# Patient Record
Sex: Female | Born: 1955
Health system: Southern US, Community
[De-identification: ages and names within clinical notes are randomized; demographics above are authoritative.]

## PROBLEM LIST (undated history)

## (undated) DIAGNOSIS — M2550 Pain in unspecified joint: Secondary | ICD-10-CM

## (undated) DIAGNOSIS — R0600 Dyspnea, unspecified: Secondary | ICD-10-CM

## (undated) DIAGNOSIS — M810 Age-related osteoporosis without current pathological fracture: Secondary | ICD-10-CM

## (undated) DIAGNOSIS — R0609 Other forms of dyspnea: Secondary | ICD-10-CM

## (undated) DIAGNOSIS — R0602 Shortness of breath: Secondary | ICD-10-CM

## (undated) DIAGNOSIS — M7989 Other specified soft tissue disorders: Secondary | ICD-10-CM

## (undated) DIAGNOSIS — M1990 Unspecified osteoarthritis, unspecified site: Secondary | ICD-10-CM

## (undated) DIAGNOSIS — N289 Disorder of kidney and ureter, unspecified: Secondary | ICD-10-CM

## (undated) DIAGNOSIS — R7303 Prediabetes: Secondary | ICD-10-CM

## (undated) DIAGNOSIS — K5900 Constipation, unspecified: Secondary | ICD-10-CM

## (undated) DIAGNOSIS — E039 Hypothyroidism, unspecified: Secondary | ICD-10-CM

## (undated) DIAGNOSIS — L817 Pigmented purpuric dermatosis: Secondary | ICD-10-CM

## (undated) DIAGNOSIS — E785 Hyperlipidemia, unspecified: Secondary | ICD-10-CM

## (undated) DIAGNOSIS — R12 Heartburn: Secondary | ICD-10-CM

## (undated) DIAGNOSIS — E559 Vitamin D deficiency, unspecified: Secondary | ICD-10-CM

## (undated) DIAGNOSIS — R011 Cardiac murmur, unspecified: Secondary | ICD-10-CM

## (undated) DIAGNOSIS — I10 Essential (primary) hypertension: Secondary | ICD-10-CM

## (undated) HISTORY — DX: Constipation, unspecified: K59.00

## (undated) HISTORY — DX: Unspecified osteoarthritis, unspecified site: M19.90

## (undated) HISTORY — DX: Other specified soft tissue disorders: M79.89

## (undated) HISTORY — DX: Heartburn: R12

## (undated) HISTORY — DX: Age-related osteoporosis without current pathological fracture: M81.0

## (undated) HISTORY — DX: Vitamin D deficiency, unspecified: E55.9

## (undated) HISTORY — PX: TONSILLECTOMY: SUR1361

## (undated) HISTORY — DX: Pain in unspecified joint: M25.50

## (undated) HISTORY — DX: Prediabetes: R73.03

## (undated) HISTORY — DX: Dyspnea, unspecified: R06.00

## (undated) HISTORY — DX: Shortness of breath: R06.02

## (undated) HISTORY — DX: Disorder of kidney and ureter, unspecified: N28.9

---

## 1998-12-22 ENCOUNTER — Other Ambulatory Visit: Admission: RE | Admit: 1998-12-22 | Discharge: 1998-12-22 | Payer: Self-pay | Admitting: Family Medicine

## 1999-08-31 ENCOUNTER — Encounter: Admission: RE | Admit: 1999-08-31 | Discharge: 1999-08-31 | Payer: Self-pay | Admitting: Family Medicine

## 1999-08-31 ENCOUNTER — Encounter: Payer: Self-pay | Admitting: Family Medicine

## 1999-12-26 ENCOUNTER — Other Ambulatory Visit: Admission: RE | Admit: 1999-12-26 | Discharge: 1999-12-26 | Payer: Self-pay | Admitting: Family Medicine

## 1999-12-27 ENCOUNTER — Encounter: Payer: Self-pay | Admitting: Urology

## 1999-12-27 ENCOUNTER — Encounter: Admission: RE | Admit: 1999-12-27 | Discharge: 1999-12-27 | Payer: Self-pay | Admitting: Urology

## 2000-10-15 ENCOUNTER — Ambulatory Visit (HOSPITAL_COMMUNITY): Admission: RE | Admit: 2000-10-15 | Discharge: 2000-10-15 | Payer: Self-pay | Admitting: Gastroenterology

## 2001-01-06 ENCOUNTER — Other Ambulatory Visit: Admission: RE | Admit: 2001-01-06 | Discharge: 2001-01-06 | Payer: Self-pay | Admitting: Family Medicine

## 2002-01-01 ENCOUNTER — Other Ambulatory Visit: Admission: RE | Admit: 2002-01-01 | Discharge: 2002-01-01 | Payer: Self-pay | Admitting: Family Medicine

## 2003-02-03 ENCOUNTER — Other Ambulatory Visit: Admission: RE | Admit: 2003-02-03 | Discharge: 2003-02-03 | Payer: Self-pay | Admitting: Family Medicine

## 2004-03-13 ENCOUNTER — Other Ambulatory Visit: Admission: RE | Admit: 2004-03-13 | Discharge: 2004-03-13 | Payer: Self-pay | Admitting: Family Medicine

## 2005-03-20 ENCOUNTER — Other Ambulatory Visit: Admission: RE | Admit: 2005-03-20 | Discharge: 2005-03-20 | Payer: Self-pay | Admitting: Family Medicine

## 2006-01-10 ENCOUNTER — Ambulatory Visit (HOSPITAL_COMMUNITY): Admission: RE | Admit: 2006-01-10 | Discharge: 2006-01-10 | Payer: Self-pay | Admitting: Orthopedic Surgery

## 2008-11-08 ENCOUNTER — Encounter: Admission: RE | Admit: 2008-11-08 | Discharge: 2008-11-08 | Payer: Self-pay | Admitting: Internal Medicine

## 2010-06-30 NOTE — Procedures (Signed)
[name]  Patient:    [name], N[place] Visit Number: [id number] MRN: [id number]          Service Type: END Location: ENDO Attending Physician:  Charna Elizabeth Dictated by:   Anselmo Rod, M.D. Proc. Date: 10/15/00 Admit Date:  10/15/2000   CC:         Talmadge Coventry, M.D.   Procedure Report  DATE OF BIRTH:  [birth year]  REFERRING PHYSICIAN:  Talmadge Coventry, M.D.  PROCEDURE PERFORMED:  Colonoscopy.  ENDOSCOPIST:  Anselmo Rod, M.D.  INSTRUMENT USED:  Olympus video colonoscope.  INDICATIONS FOR PROCEDURE:  Rectal bleeding with history of iron deficiency anemia in a 55 year old white female rule out colonic polyps, masses, hemorrhoids, etc.  PREPROCEDURE PREPARATION:  Informed consent was procured from the patient. The patient was fasted for eight hours prior to the procedure and prepped with a bottle of magnesium citrate and a gallon of NuLytely the night prior to the procedure.  PREPROCEDURE PHYSICAL:  The patient had stable vital signs.  Neck supple. Chest clear to auscultation.  S1, S2 regular.  Abdomen soft with normal abdominal bowel sounds.  No hepatosplenomegaly, no masses palpable.  DESCRIPTION OF PROCEDURE:  The patient was placed in the left lateral decubitus position and sedated with 50 mg of Demerol and 5 mg of Versed intravenously.  Once the patient was adequately sedated and maintained on low-flow oxygen and continuous cardiac monitoring, the Olympus video colonoscope was advanced from the rectum to the cecum with difficulty secondary to a large amount of residual stool in the colon.  No masses, polyps, erosions, or ulcerations were seen.  The patient had pandiverticulosis and tolerated the procedure well without complication.  The appendiceal orifice and ileocecal valve were clearly visualized and photographed.  IMPRESSION: 1. Pandiverticular disease. 2. No masses or polyps. 3. Large amount of stool  in the colon.  A small lesion could have been    missed.  RECOMMENDATIONS: 1. A high fiber diet has been recommended for the patient. 2. Outpatient follow-up is advised.  Further recommendations will be made    at that time.Dictated by:   Anselmo Rod, M.D. Attending Physician:  Charna Elizabeth DD:  10/15/00 TD:  10/16/00 Job: [id number]

## 2012-09-05 ENCOUNTER — Other Ambulatory Visit: Payer: Self-pay | Admitting: Cardiology

## 2012-09-09 ENCOUNTER — Other Ambulatory Visit: Payer: Self-pay | Admitting: Cardiology

## 2012-09-09 DIAGNOSIS — I872 Venous insufficiency (chronic) (peripheral): Secondary | ICD-10-CM

## 2012-09-09 DIAGNOSIS — R600 Localized edema: Secondary | ICD-10-CM

## 2012-09-10 ENCOUNTER — Ambulatory Visit
Admission: RE | Admit: 2012-09-10 | Discharge: 2012-09-10 | Disposition: A | Discharge status: Home / Self Care | Payer: 59 | Source: Ambulatory Visit | Attending: Cardiology | Admitting: Cardiology

## 2012-09-10 ENCOUNTER — Other Ambulatory Visit: Payer: Self-pay | Admitting: Cardiology

## 2012-09-10 DIAGNOSIS — I872 Venous insufficiency (chronic) (peripheral): Secondary | ICD-10-CM

## 2012-09-10 DIAGNOSIS — R600 Localized edema: Secondary | ICD-10-CM

## 2012-09-10 HISTORY — DX: Hyperlipidemia, unspecified: E78.5

## 2012-09-10 HISTORY — DX: Dyspnea, unspecified: R06.00

## 2012-09-10 HISTORY — DX: Other forms of dyspnea: R06.09

## 2012-09-10 HISTORY — DX: Unspecified osteoarthritis, unspecified site: M19.90

## 2012-09-10 HISTORY — DX: Morbid (severe) obesity due to excess calories: E66.01

## 2012-09-10 HISTORY — DX: Prediabetes: R73.03

## 2012-09-10 HISTORY — DX: Essential (primary) hypertension: I10

## 2012-09-10 HISTORY — DX: Hypothyroidism, unspecified: E03.9

## 2012-09-10 HISTORY — DX: Pigmented purpuric dermatosis: L81.7

## 2015-01-20 IMAGING — US US RADIOLOGIST EVAL AND MGMT
1 series · 13 of 16 positions shown · non-contrast
Comparison: none

[Series 1: us radiologist eval and mgmt · 13 of 59 slices shown]
[im 1/59]
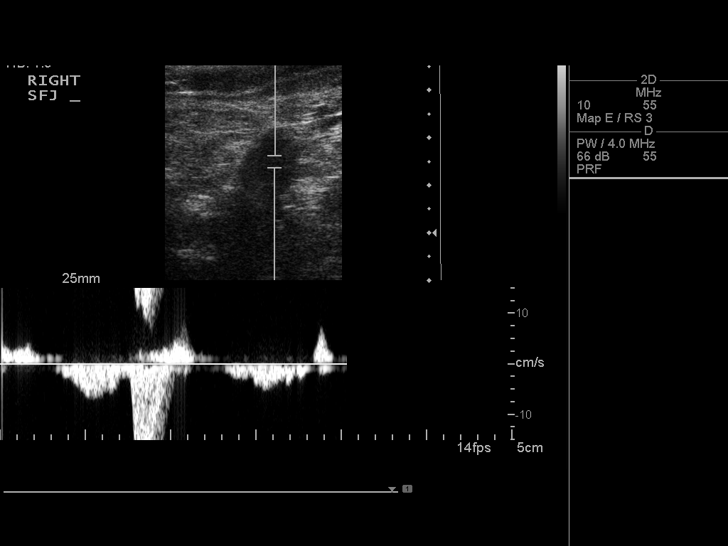
[im 4/59]
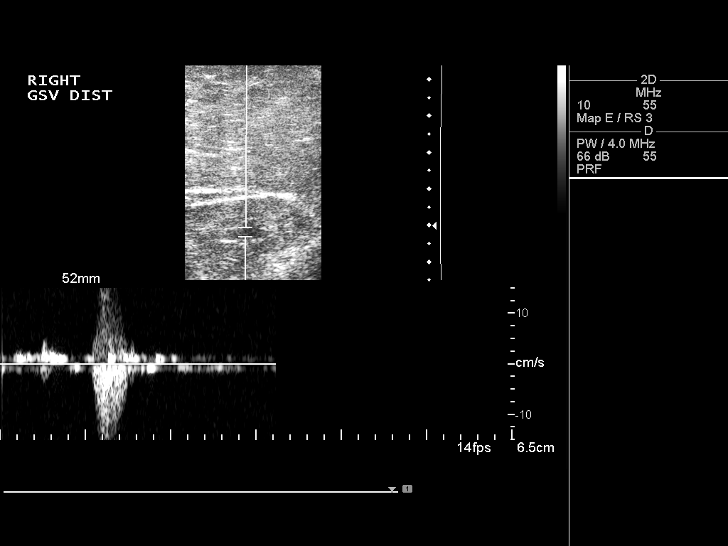
[im 12/59]
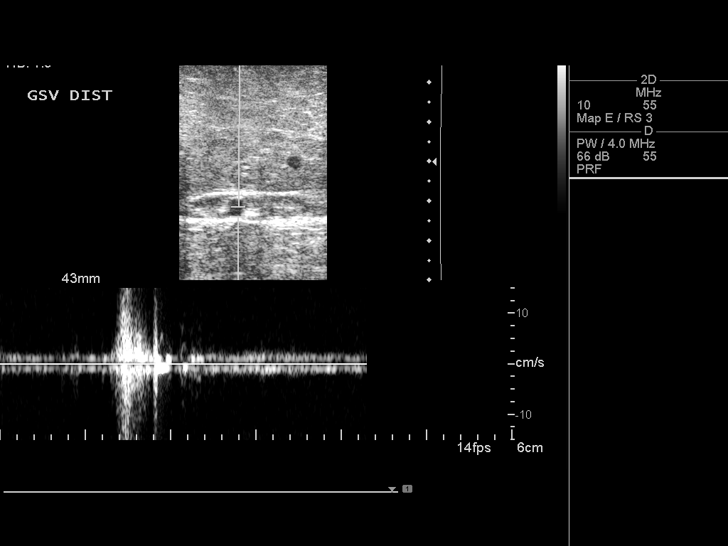
[im 16/59]
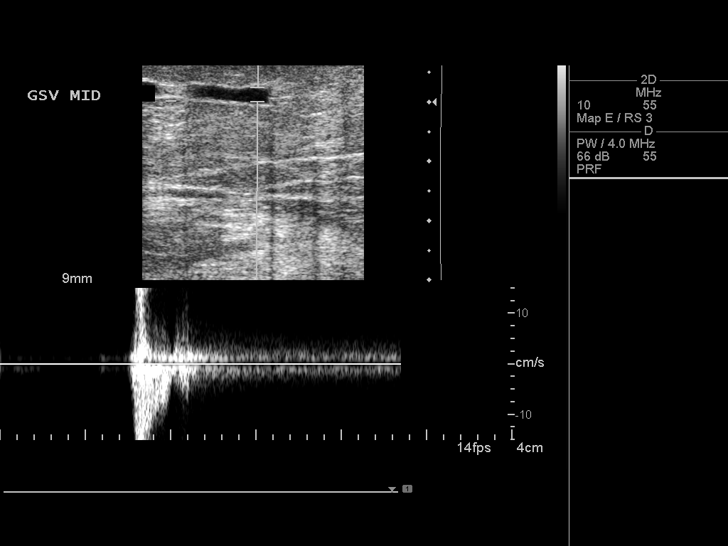
[im 20/59]
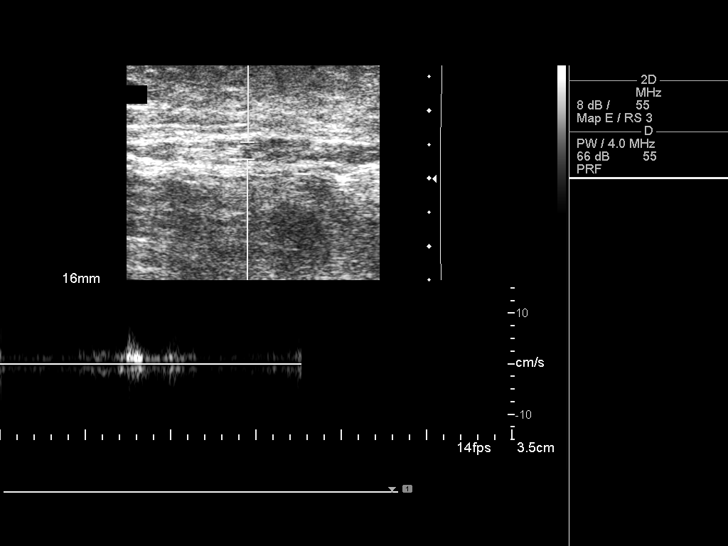
[im 24/59]
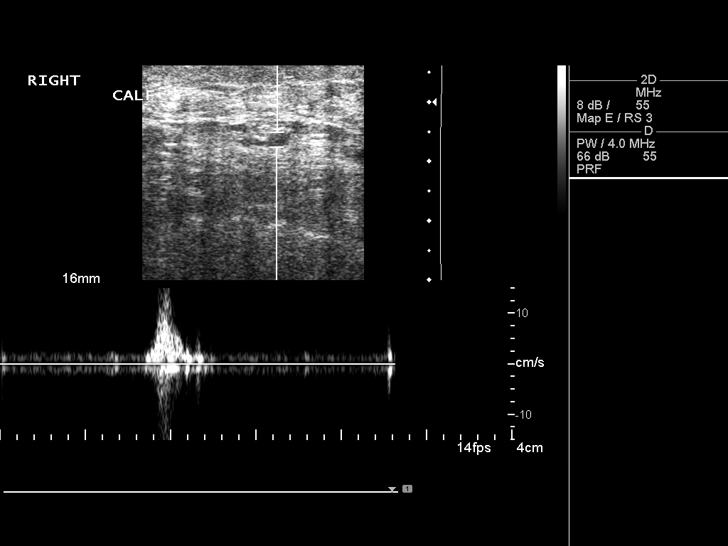
[im 31/59]
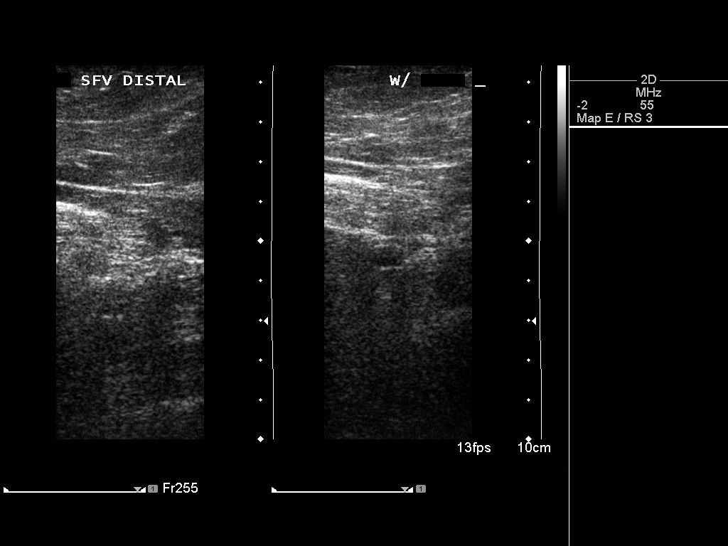
[im 35/59]
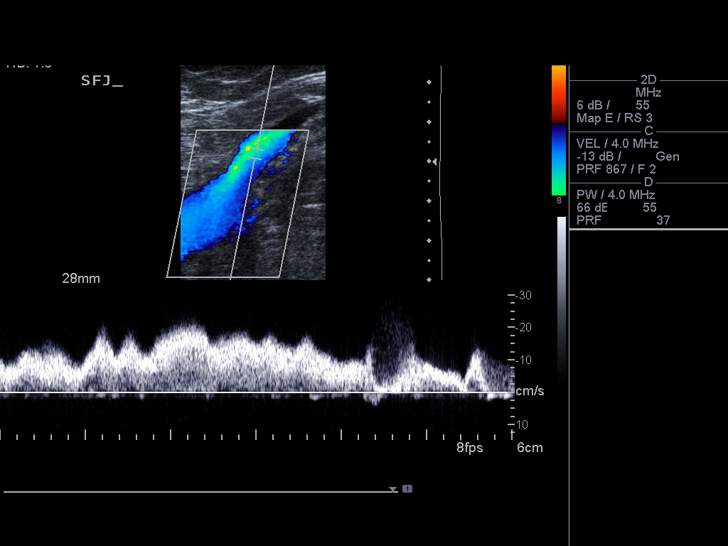
[im 39/59]
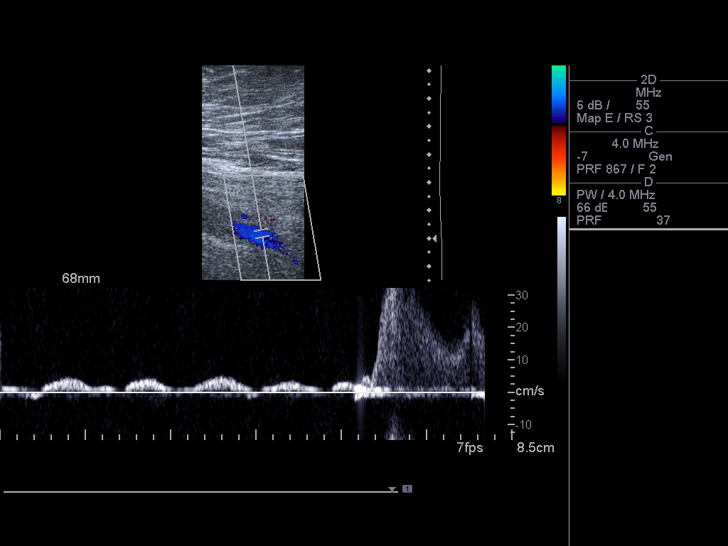
[im 43/59]
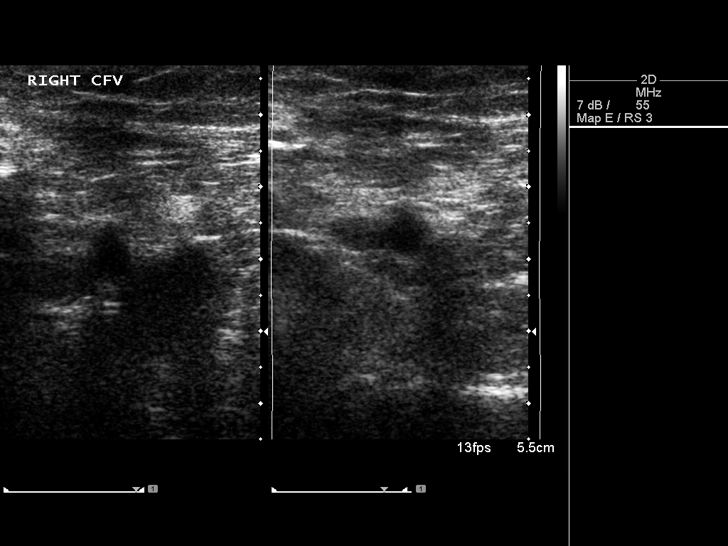
[im 47/59]
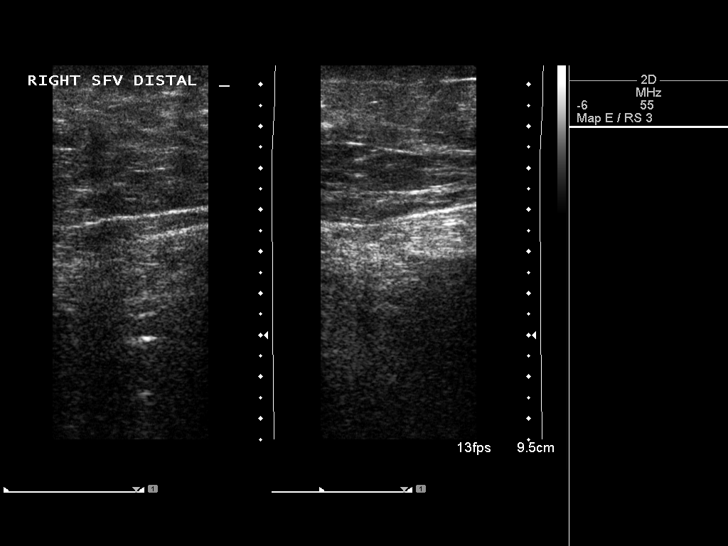
[im 55/59]
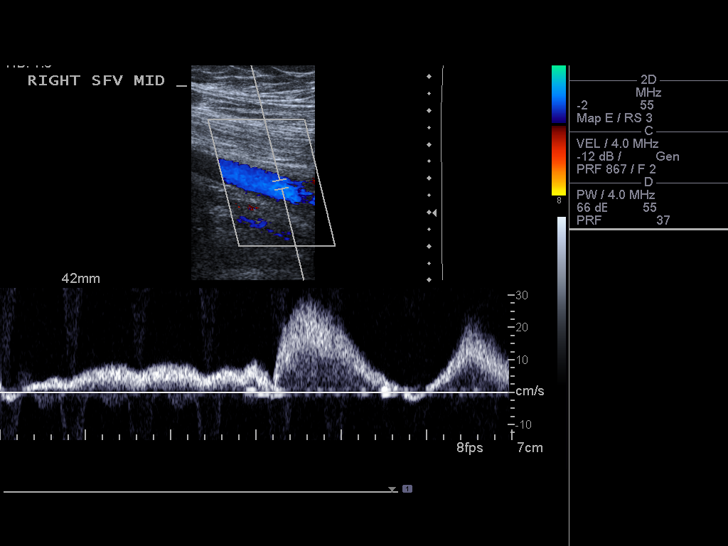
[im 59/59]
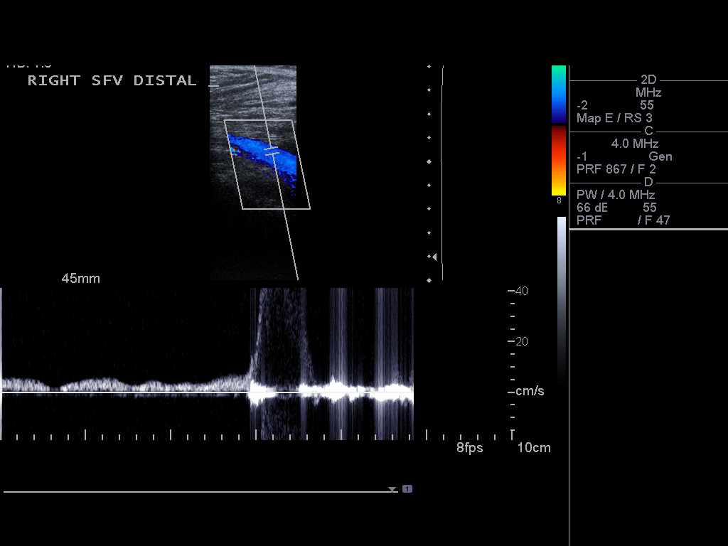

[13 of 16 positions shown; findings below may reference images not displayed]

September 10, 2012

[facility]

Re:  Bageecha Dauria (DOB: [birth year])

Dear Dr. Marin Emilia:

Thank you for your referral of Ms. Harbal regarding lower
extremity edema and the possibility of venous insufficiency.  She
is a 57-year-old female with a long history of ankle and calf
swelling as well as discoloration.  She had spent a lot of time
working on her feet but most recently has a desk job and sits for
long periods of time.  She also drives for long periods of time
traveling to and from work and her residence.  Other than edema,
she has occasional pain in her ankles and does describe spider
veins as well as discolorations.  She denies any skin breakdown or
prior treatment for venous insufficiency.

Past medical history includes arthritis, diabetes mellitus,
hypertension, hypercholesterolemia and thyroid disease.

Past surgical history includes tonsillectomy.

Medications:  Potassium, levothyroxine, lisinopril, Lasix,
simvastatin and vitamins.

Allergies:  None.

Social History:  Negative tobacco and negative alcohol.

On physical exam, her lower extremities are warm and pink
bilaterally.  There is some discoloration at the ankles bilaterally
and a noticeable lack of hair below the knees.  She has 2+ pitting
edema at the right ankle and 1+ at the left ankle.  Dorsalis pedis
and posterior tibial pulses are 2+ bilaterally.  No skin breakdown.
No obvious varicosities.  Minimal scattered spider veins are noted
in the lower extremities.

Doppler ultrasound was performed demonstrating no evidence of DVT.
There was also no evidence of venous insufficiency in the left
lower extremity.  There was only trace minimal reflux within the
mid calf portion of the right greater saphenous vein, but otherwise
no evidence of reflux in the superficial venous system of the right
lower extremity.

In summary, Ms. Harbal does have some of the stigmata of venous
disease; however, the Doppler study of her lower extremity venous
system was virtually negative.  Because she
Page 2
Re:  Bageecha Dauria (DOB: [birth year])

does have edema, I would recommend knee-high compression stockings,
which she has.  No other treatment or therapy is warranted at this
time.

Once again, thank you for the referral of this pleasant patient.

Sincerely,

/et

## 2015-03-11 MED FILL — SIMVASTATIN 20 MG TABLET: 20 | 90 days supply | Qty: 90 | Fill #0

## 2015-03-11 MED FILL — LEVOTHYROXINE 112 MCG TAB: 112 | 30 days supply | Qty: 30 | Fill #5

## 2015-03-30 MED FILL — TORSEMIDE 20 MG TABLET: 20 | 90 days supply | Qty: 90 | Fill #0

## 2015-03-30 MED FILL — LISINOPRIL 20 MG TABLET: 20 | 90 days supply | Qty: 90 | Fill #0

## 2015-04-15 MED FILL — LEVOTHYROXINE 112 MCG TAB: 112 | 90 days supply | Qty: 90 | Fill #0

## 2015-04-18 DIAGNOSIS — E559 Vitamin D deficiency, unspecified: Secondary | ICD-10-CM | POA: Diagnosis not present

## 2015-04-18 DIAGNOSIS — E039 Hypothyroidism, unspecified: Secondary | ICD-10-CM | POA: Diagnosis not present

## 2015-04-18 DIAGNOSIS — R7309 Other abnormal glucose: Secondary | ICD-10-CM | POA: Diagnosis not present

## 2015-04-18 DIAGNOSIS — E785 Hyperlipidemia, unspecified: Secondary | ICD-10-CM | POA: Diagnosis not present

## 2015-04-18 DIAGNOSIS — I10 Essential (primary) hypertension: Secondary | ICD-10-CM | POA: Diagnosis not present

## 2015-07-21 MED FILL — LEVOTHYROXINE 112 MCG TAB: 112 | 90 days supply | Qty: 90 | Fill #1

## 2015-07-28 DIAGNOSIS — R7309 Other abnormal glucose: Secondary | ICD-10-CM | POA: Diagnosis not present

## 2015-07-28 DIAGNOSIS — E785 Hyperlipidemia, unspecified: Secondary | ICD-10-CM | POA: Diagnosis not present

## 2015-07-28 DIAGNOSIS — Z0000 Encounter for general adult medical examination without abnormal findings: Secondary | ICD-10-CM | POA: Diagnosis not present

## 2015-07-28 DIAGNOSIS — I10 Essential (primary) hypertension: Secondary | ICD-10-CM | POA: Diagnosis not present

## 2015-07-28 DIAGNOSIS — Z1239 Encounter for other screening for malignant neoplasm of breast: Secondary | ICD-10-CM | POA: Diagnosis not present

## 2015-07-28 DIAGNOSIS — E039 Hypothyroidism, unspecified: Secondary | ICD-10-CM | POA: Diagnosis not present

## 2015-07-28 MED FILL — LISINOPRIL 20 MG TABLET: 20 | 90 days supply | Qty: 90 | Fill #0

## 2015-07-28 MED FILL — TORSEMIDE 20 MG TABLET: 20 | 90 days supply | Qty: 90 | Fill #0

## 2015-09-21 MED FILL — SIMVASTATIN 20 MG TABLET: 20 | 90 days supply | Qty: 90 | Fill #0

## 2015-10-24 DIAGNOSIS — R202 Paresthesia of skin: Secondary | ICD-10-CM | POA: Diagnosis not present

## 2015-10-24 DIAGNOSIS — R7309 Other abnormal glucose: Secondary | ICD-10-CM | POA: Diagnosis not present

## 2015-10-24 DIAGNOSIS — E785 Hyperlipidemia, unspecified: Secondary | ICD-10-CM | POA: Diagnosis not present

## 2015-10-24 DIAGNOSIS — I10 Essential (primary) hypertension: Secondary | ICD-10-CM | POA: Diagnosis not present

## 2015-10-24 DIAGNOSIS — Z1231 Encounter for screening mammogram for malignant neoplasm of breast: Secondary | ICD-10-CM | POA: Diagnosis not present

## 2015-10-24 DIAGNOSIS — E039 Hypothyroidism, unspecified: Secondary | ICD-10-CM | POA: Diagnosis not present

## 2015-10-28 MED FILL — LEVOTHYROXINE 112 MCG TAB: 112 | 30 days supply | Qty: 30 | Fill #0

## 2015-11-30 MED FILL — LEVOTHYROXINE 112 MCG TAB: 112 | 30 days supply | Qty: 30 | Fill #1

## 2015-12-28 MED FILL — SIMVASTATIN 20 MG TABLET: 20 | 90 days supply | Qty: 90 | Fill #1

## 2015-12-28 MED FILL — LISINOPRIL 20 MG TABLET: 20 | 90 days supply | Qty: 90 | Fill #1

## 2016-01-03 MED FILL — LEVOTHYROXINE 112 MCG TAB: 112 | 30 days supply | Qty: 30 | Fill #2

## 2016-02-01 MED FILL — LEVOTHYROXINE 112 MCG TAB: 112 | 30 days supply | Qty: 30 | Fill #3

## 2016-03-09 DIAGNOSIS — H5203 Hypermetropia, bilateral: Secondary | ICD-10-CM | POA: Diagnosis not present

## 2016-03-12 MED FILL — LEVOTHYROXINE 112 MCG TAB: 112 | 30 days supply | Qty: 30 | Fill #0

## 2016-04-03 DIAGNOSIS — I10 Essential (primary) hypertension: Secondary | ICD-10-CM | POA: Diagnosis not present

## 2016-04-03 DIAGNOSIS — R7309 Other abnormal glucose: Secondary | ICD-10-CM | POA: Diagnosis not present

## 2016-04-03 DIAGNOSIS — E785 Hyperlipidemia, unspecified: Secondary | ICD-10-CM | POA: Diagnosis not present

## 2016-04-03 DIAGNOSIS — E039 Hypothyroidism, unspecified: Secondary | ICD-10-CM | POA: Diagnosis not present

## 2016-04-03 MED FILL — SIMVASTATIN 20 MG TABLET: 20 | 90 days supply | Qty: 90 | Fill #0

## 2016-04-06 MED FILL — HYDROCODONE-HOMATROPINE SYR: 5-1.5 | 6 days supply | Qty: 120 | Fill #0

## 2016-05-03 MED FILL — LISINOPRIL 20 MG TABLET: 20 | 90 days supply | Qty: 90 | Fill #0

## 2016-05-09 MED FILL — LEVOTHYROXINE 112 MCG TAB: 112 | 30 days supply | Qty: 30 | Fill #1

## 2016-06-08 MED FILL — LEVOTHYROXINE 112 MCG TAB: 112 | 30 days supply | Qty: 30 | Fill #2

## 2016-07-05 ENCOUNTER — Inpatient Hospital Stay (HOSPITAL_COMMUNITY)
Admission: AD | Admit: 2016-07-05 | Discharge: 2016-07-17 | DRG: 330 | Disposition: A | Discharge status: Home Health | Payer: 59 | Source: Other Acute Inpatient Hospital | Attending: Internal Medicine | Admitting: Internal Medicine

## 2016-07-05 ENCOUNTER — Encounter (HOSPITAL_COMMUNITY): Payer: Self-pay | Admitting: *Deleted

## 2016-07-05 DIAGNOSIS — Z6839 Body mass index (BMI) 39.0-39.9, adult: Secondary | ICD-10-CM | POA: Diagnosis not present

## 2016-07-05 DIAGNOSIS — R7303 Prediabetes: Secondary | ICD-10-CM | POA: Diagnosis present

## 2016-07-05 DIAGNOSIS — K56699 Other intestinal obstruction unspecified as to partial versus complete obstruction: Secondary | ICD-10-CM | POA: Diagnosis not present

## 2016-07-05 DIAGNOSIS — M1990 Unspecified osteoarthritis, unspecified site: Secondary | ICD-10-CM | POA: Diagnosis present

## 2016-07-05 DIAGNOSIS — K6389 Other specified diseases of intestine: Secondary | ICD-10-CM | POA: Diagnosis present

## 2016-07-05 DIAGNOSIS — R935 Abnormal findings on diagnostic imaging of other abdominal regions, including retroperitoneum: Secondary | ICD-10-CM | POA: Diagnosis not present

## 2016-07-05 DIAGNOSIS — R109 Unspecified abdominal pain: Secondary | ICD-10-CM | POA: Diagnosis not present

## 2016-07-05 DIAGNOSIS — E876 Hypokalemia: Secondary | ICD-10-CM | POA: Diagnosis present

## 2016-07-05 DIAGNOSIS — D72829 Elevated white blood cell count, unspecified: Secondary | ICD-10-CM | POA: Diagnosis not present

## 2016-07-05 DIAGNOSIS — I10 Essential (primary) hypertension: Secondary | ICD-10-CM | POA: Diagnosis not present

## 2016-07-05 DIAGNOSIS — K5120 Ulcerative (chronic) proctitis without complications: Secondary | ICD-10-CM | POA: Diagnosis not present

## 2016-07-05 DIAGNOSIS — Z9049 Acquired absence of other specified parts of digestive tract: Secondary | ICD-10-CM

## 2016-07-05 DIAGNOSIS — Z933 Colostomy status: Secondary | ICD-10-CM | POA: Diagnosis not present

## 2016-07-05 DIAGNOSIS — L817 Pigmented purpuric dermatosis: Secondary | ICD-10-CM | POA: Diagnosis present

## 2016-07-05 DIAGNOSIS — E861 Hypovolemia: Secondary | ICD-10-CM | POA: Diagnosis present

## 2016-07-05 DIAGNOSIS — D62 Acute posthemorrhagic anemia: Secondary | ICD-10-CM | POA: Diagnosis present

## 2016-07-05 DIAGNOSIS — K5732 Diverticulitis of large intestine without perforation or abscess without bleeding: Principal | ICD-10-CM | POA: Diagnosis present

## 2016-07-05 DIAGNOSIS — Z79899 Other long term (current) drug therapy: Secondary | ICD-10-CM

## 2016-07-05 DIAGNOSIS — R197 Diarrhea, unspecified: Secondary | ICD-10-CM | POA: Diagnosis not present

## 2016-07-05 DIAGNOSIS — I5030 Unspecified diastolic (congestive) heart failure: Secondary | ICD-10-CM | POA: Diagnosis not present

## 2016-07-05 DIAGNOSIS — K5720 Diverticulitis of large intestine with perforation and abscess without bleeding: Secondary | ICD-10-CM | POA: Diagnosis not present

## 2016-07-05 DIAGNOSIS — Z4659 Encounter for fitting and adjustment of other gastrointestinal appliance and device: Secondary | ICD-10-CM

## 2016-07-05 DIAGNOSIS — E7800 Pure hypercholesterolemia, unspecified: Secondary | ICD-10-CM | POA: Diagnosis not present

## 2016-07-05 DIAGNOSIS — K5669 Other partial intestinal obstruction: Secondary | ICD-10-CM | POA: Diagnosis not present

## 2016-07-05 DIAGNOSIS — K5730 Diverticulosis of large intestine without perforation or abscess without bleeding: Secondary | ICD-10-CM | POA: Diagnosis not present

## 2016-07-05 DIAGNOSIS — R933 Abnormal findings on diagnostic imaging of other parts of digestive tract: Secondary | ICD-10-CM | POA: Diagnosis not present

## 2016-07-05 DIAGNOSIS — T8189XA Other complications of procedures, not elsewhere classified, initial encounter: Secondary | ICD-10-CM | POA: Diagnosis not present

## 2016-07-05 DIAGNOSIS — E860 Dehydration: Secondary | ICD-10-CM | POA: Diagnosis present

## 2016-07-05 DIAGNOSIS — R600 Localized edema: Secondary | ICD-10-CM | POA: Diagnosis not present

## 2016-07-05 DIAGNOSIS — R112 Nausea with vomiting, unspecified: Secondary | ICD-10-CM | POA: Diagnosis not present

## 2016-07-05 DIAGNOSIS — R1110 Vomiting, unspecified: Secondary | ICD-10-CM | POA: Diagnosis not present

## 2016-07-05 DIAGNOSIS — R5383 Other fatigue: Secondary | ICD-10-CM | POA: Diagnosis not present

## 2016-07-05 DIAGNOSIS — E039 Hypothyroidism, unspecified: Secondary | ICD-10-CM | POA: Diagnosis not present

## 2016-07-05 DIAGNOSIS — K56691 Other complete intestinal obstruction: Secondary | ICD-10-CM | POA: Diagnosis not present

## 2016-07-05 DIAGNOSIS — E785 Hyperlipidemia, unspecified: Secondary | ICD-10-CM | POA: Diagnosis present

## 2016-07-05 DIAGNOSIS — K56609 Unspecified intestinal obstruction, unspecified as to partial versus complete obstruction: Secondary | ICD-10-CM

## 2016-07-05 DIAGNOSIS — R531 Weakness: Secondary | ICD-10-CM | POA: Diagnosis not present

## 2016-07-05 DIAGNOSIS — K639 Disease of intestine, unspecified: Secondary | ICD-10-CM | POA: Diagnosis not present

## 2016-07-05 DIAGNOSIS — K5792 Diverticulitis of intestine, part unspecified, without perforation or abscess without bleeding: Secondary | ICD-10-CM | POA: Diagnosis present

## 2016-07-05 DIAGNOSIS — Z433 Encounter for attention to colostomy: Secondary | ICD-10-CM | POA: Diagnosis not present

## 2016-07-05 DIAGNOSIS — Z4682 Encounter for fitting and adjustment of non-vascular catheter: Secondary | ICD-10-CM | POA: Diagnosis not present

## 2016-07-05 DIAGNOSIS — K5651 Intestinal adhesions [bands], with partial obstruction: Secondary | ICD-10-CM | POA: Diagnosis not present

## 2016-07-05 DIAGNOSIS — R140 Abdominal distension (gaseous): Secondary | ICD-10-CM | POA: Diagnosis not present

## 2016-07-05 LAB — COMPREHENSIVE METABOLIC PANEL
ALBUMIN: 3.5 g/dL (ref 3.5–5.0)
ALK PHOS: 61 U/L (ref 38–126)
ALT: 14 U/L (ref 14–54)
AST: 17 U/L (ref 15–41)
Anion gap: 9 (ref 5–15)
BILIRUBIN TOTAL: 0.8 mg/dL (ref 0.3–1.2)
BUN: 14 mg/dL (ref 6–20)
CALCIUM: 8.3 mg/dL — AB (ref 8.9–10.3)
CO2: 21 mmol/L — AB (ref 22–32)
CREATININE: 0.79 mg/dL (ref 0.44–1.00)
Chloride: 111 mmol/L (ref 101–111)
GFR calc Af Amer: >60 mL/min (ref >60)
GFR calc non Af Amer: >60 mL/min (ref >60)
GLUCOSE: 101 mg/dL — AB (ref 65–99)
Potassium: 3.4 mmol/L — ABNORMAL LOW (ref 3.5–5.1)
SODIUM: 141 mmol/L (ref 135–145)
TOTAL PROTEIN: 6.9 g/dL (ref 6.5–8.1)

## 2016-07-05 LAB — CBC WITH DIFFERENTIAL/PLATELET
BASOS PCT: 0 %
BLASTS: 0 %
Band Neutrophils: 0 %
Basophils Absolute: 0 10*3/uL (ref 0.0–0.1)
EOS ABS: 0.1 10*3/uL (ref 0.0–0.7)
Eosinophils Relative: 1 %
HEMATOCRIT: 38.9 % (ref 36.0–46.0)
Hemoglobin: 12.6 g/dL (ref 12.0–15.0)
LYMPHS PCT: 23 %
Lymphs Abs: 1.4 10*3/uL (ref 0.7–4.0)
MCH: 27.8 pg (ref 26.0–34.0)
MCHC: 32.4 g/dL (ref 30.0–36.0)
MCV: 85.7 fL (ref 78.0–100.0)
MONO ABS: 0.6 10*3/uL (ref 0.1–1.0)
MYELOCYTES: 0 %
Metamyelocytes Relative: 1 %
Monocytes Relative: 10 %
NEUTROS PCT: 65 %
NRBC: 0 /100{WBCs}
Neutro Abs: 4.1 10*3/uL (ref 1.7–7.7)
OTHER: 0 %
Platelets: 251 10*3/uL (ref 150–400)
Promyelocytes Absolute: 0 %
RBC: 4.54 MIL/uL (ref 3.87–5.11)
RDW: 14.7 % (ref 11.5–15.5)
WBC: 6.2 10*3/uL (ref 4.0–10.5)

## 2016-07-05 LAB — PHOSPHORUS: Phosphorus: 3.1 mg/dL (ref 2.5–4.6)

## 2016-07-05 LAB — MAGNESIUM: Magnesium: 1.8 mg/dL (ref 1.7–2.4)

## 2016-07-05 LAB — GLUCOSE, CAPILLARY: Glucose-Capillary: 97 mg/dL (ref 65–99)

## 2016-07-05 MED ORDER — FAMOTIDINE IN NACL 20-0.9 MG/50ML-% IV SOLN
20.0000 mg | Freq: Two times a day (BID) | INTRAVENOUS | Status: DC
  Administered 2016-07-06 – 2016-07-07 (×4): 20 mg via INTRAVENOUS
  Filled 2016-07-05 (×5): qty 50

## 2016-07-05 MED ORDER — ONDANSETRON HCL 4 MG/2ML IJ SOLN
4.0000 mg | Freq: Four times a day (QID) | INTRAMUSCULAR | Status: DC | PRN
  Administered 2016-07-10: 4 mg via INTRAVENOUS

## 2016-07-05 MED ORDER — FENTANYL CITRATE (PF) 100 MCG/2ML IJ SOLN: 50.0000 ug | INTRAMUSCULAR | Status: DC | PRN

## 2016-07-05 MED ORDER — POTASSIUM CHLORIDE IN NACL 20-0.9 MEQ/L-% IV SOLN
INTRAVENOUS | Status: DC
  Administered 2016-07-05 – 2016-07-06 (×2): via INTRAVENOUS
  Filled 2016-07-05 (×2): qty 1000

## 2016-07-05 MED ORDER — PHENOL 1.4 % MT LIQD
1.0000 | OROMUCOSAL | Status: DC | PRN
  Administered 2016-07-06: 1 via OROMUCOSAL
  Filled 2016-07-05: qty 177

## 2016-07-05 MED ORDER — MAGNESIUM SULFATE 2 GM/50ML IV SOLN
2.0000 g | Freq: Once | INTRAVENOUS | Status: AC
  Administered 2016-07-05: 2 g via INTRAVENOUS
  Filled 2016-07-05: qty 50

## 2016-07-05 NOTE — Plan of Care (Signed)
Problem: Pain Managment: Goal: General experience of comfort will improve Outcome: Not Progressing Due to nausea  Problem: Activity: Goal: Risk for activity intolerance will decrease Outcome: Not Progressing Due to nausea

## 2016-07-05 NOTE — Progress Notes (Addendum)
Nurse called [facility] ED and received report from Jay. Patient has not left [facility] ED. Transportation has been called. Patient will be coming to [place].

## 2016-07-05 NOTE — Progress Notes (Signed)
Nurse paged admitting hospitalist, Ghimire to make Dr. Sloan Leiter aware of patient arriving to floor from [facility]. Dr. Laurena Bering called nurse and instructed nurse to call flow manager because he is currently off duty. Nurse paged [facility] admissions.

## 2016-07-05 NOTE — H&P (Signed)
History and Physical    [name] WPV:[id number] DOB: Dec 03, 1955 DOA: 07/05/2016  PCP: Patient, No Pcp Per   Patient coming from: Transferred from [facility] ED.  I have personally briefly reviewed patient's old medical records in [place]  Chief Complaint: Abdominal pain.  HPI: [name] is a 61 y.o. female with medical history significant of osteoarthritis, mild dyspnea on exertion, hyperlipidemia, hypertension, hypothyroidism, morbid obesity, prediabetes, Schamberg disease who is coming from [facility] ED after presenting there with 4-6 weeks of recurrent abdominal pain, on and off nausea/vomiting and diarrhea.  Per patient, she woke up around 0 400 with nausea and has vomited about 4 times since then. She has had 3-4 loose stool BM as well. For the past few weeks, she states that she has been alternating diarrhea with constipation. She denies melena or hematochezia, but states that her stools are thinner than usual. She also complains of fatigue, malaise, decreased appetite and weight loss of several pounds. She denies fever, chills, headache, chest pain, productive cough, dyspnea, palpitations, diaphoresis or dizziness. She denies dysuria or frequency. Her sleep has been disturbed due to the symptoms.  ED Course at [facility]: The patient was given IV fluids, analgesics and antiemetics. Workup there showed WBC of 16.9 with 87.6% neutrophils, hemoglobin 13.5 and platelets of 299. WBC at this facility 6.2. Sodium 146, potassium 4.0, chloride 109 and bicarbonate 22 mmol/L. BUN was 13, creatinine 0.9, calcium 9.1 and glucose 115 mg/dL. Lipase level was 61.  Imaging: CT abdomen/pelvis with contrast shows distal colonic obstruction of proximal sigmoid colon, malignancy is suspected. No metastases seen. Please see full report for further detail.  Review of Systems: As per HPI otherwise 10 point review of systems negative.    Past Medical History:  Diagnosis Date  . Arthritis     . DOE (dyspnea on exertion)    mild  . Hyperlipidemia   . Hypertension   . Hypothyroidism   . Morbid obesity ([place])   . Pre-diabetes   . Schamberg disease     Past Surgical History:  Procedure Laterality Date  . TONSILLECTOMY     age 79 yrs     reports that she has never smoked. She has never used smokeless tobacco. She reports that she does not drink alcohol or use drugs.  No Known Allergies  Family History  Problem Relation Age of Onset  . Hypertension Mother   . Heart Problems Mother        Pacemaker  . Heart attack Father        at age 48.  Marland Kitchen Heart attack Maternal Grandfather 22       Died of AAA  . AAA (abdominal aortic aneurysm) Maternal Grandfather   . Heart attack Maternal Uncle   . Stroke Paternal Aunt   . Alzheimer's disease Maternal Grandmother     Prior to Admission medications   Medication Sig Start Date End Date Taking? Authorizing Provider  calcium-vitamin D (OSCAL WITH D) 500-200 MG-UNIT per tablet Take 1 tablet by mouth.    [provider]  levothyroxine (SYNTHROID, LEVOTHROID) 112 MCG tablet Take 112 mcg by mouth daily before breakfast.    [provider]  lisinopril (PRINIVIL,ZESTRIL) 20 MG tablet Take 20 mg by mouth daily.    [provider]  potassium chloride SA (K-DUR,KLOR-CON) 20 MEQ tablet Take 20 mEq by mouth 2 (two) times daily.    [provider]  simvastatin (ZOCOR) 20 MG tablet Take 20 mg by mouth  every evening.    [provider]  torsemide (DEMADEX) 20 MG tablet Take 20 mg by mouth daily.    [provider]    Physical Exam: Vitals:   07/05/16 1806 07/05/16 1832  BP: (!) 141/60   Pulse: (!) 107   Resp: 18   Temp: 98 F (36.7 C)   TempSrc: Oral   SpO2: 98%   Weight:  105.2 kg (232 lb)  Height:  5\' 4"  (1.626 m)    Constitutional: NAD, calm, comfortable Eyes: PERRL, lids and conjunctivae normal ENMT: Mucous membranes are mildly dry. Posterior pharynx clear of any exudate or  lesions. Neck: normal, supple, no masses, no thyromegaly Respiratory: clear to auscultation bilaterally, no wheezing, no crackles. Normal respiratory effort. No accessory muscle use.  Cardiovascular: Regular rate and rhythm, no murmurs / rubs / gallops. 2+ lower extremity edema. 2+ pedal pulses. No carotid bruits.  Abdomen: Distended, obese, soft, mild diffuse tenderness, no guarding/rebound/masses palpated. No hepatosplenomegaly. Bowel sounds positive.  Musculoskeletal: no clubbing / cyanosis.  Good ROM, no contractures. Normal muscle tone.  Skin: Lower extremities skin discoloration. No induration Neurologic: CN 2-12 grossly intact. Sensation intact, DTR normal. Strength 5/5 in all 4.  Psychiatric: Normal judgment and insight. Alert and oriented x 3. Normal mood.    Labs on Admission: I have personally reviewed following labs and imaging studies  CBC:  Recent Labs Lab 07/05/16 1843  WBC 6.2  NEUTROABS 4.1  HGB 12.6  HCT 38.9  MCV 85.7  PLT 563   Basic Metabolic Panel:  Recent Labs Lab 07/05/16 1843  NA 141  K 3.4*  CL 111  CO2 21*  GLUCOSE 101*  BUN 14  CREATININE 0.79  CALCIUM 8.3*  MG 1.8  PHOS 3.1   GFR: Estimated Creatinine Clearance: 86.2 mL/min (by C-G formula based on SCr of 0.79 mg/dL). Liver Function Tests:  Recent Labs Lab 07/05/16 1843  AST 17  ALT 14  ALKPHOS 61  BILITOT 0.8  PROT 6.9  ALBUMIN 3.5   No results for input(s): LIPASE, AMYLASE in the last 168 hours. No results for input(s): AMMONIA in the last 168 hours. Coagulation Profile: No results for input(s): INR, PROTIME in the last 168 hours. Cardiac Enzymes: No results for input(s): CKTOTAL, CKMB, CKMBINDEX, TROPONINI in the last 168 hours. BNP (last 3 results) No results for input(s): PROBNP in the last 8760 hours. HbA1C: No results for input(s): HGBA1C in the last 72 hours. CBG: No results for input(s): GLUCAP in the last 168 hours. Lipid Profile: No results for input(s): CHOL,  HDL, LDLCALC, TRIG, CHOLHDL, LDLDIRECT in the last 72 hours. Thyroid Function Tests: No results for input(s): TSH, T4TOTAL, FREET4, T3FREE, THYROIDAB in the last 72 hours. Anemia Panel: No results for input(s): VITAMINB12, FOLATE, FERRITIN, TIBC, IRON, RETICCTPCT in the last 72 hours. Urine analysis: No results found for: COLORURINE, APPEARANCEUR, LABSPEC, PHURINE, GLUCOSEU, HGBUR, BILIRUBINUR, KETONESUR, PROTEINUR, UROBILINOGEN, NITRITE, LEUKOCYTESUR  Radiological Exams on Admission: No results found.  EKG: Independently reviewed. Vent. rate 93 BPM PR interval 160 ms QRS duration 90 ms QT/QTc 356/442 ms P-R-T axes 44 5 45 Normal sinus rhythm Cannot rule out Anterior infarct , age undetermined Abnormal ECG  Assessment/Plan Principal Problem:   SBO (small bowel obstruction) (HCC) Keep nothing by mouth. NG tube suctioning. Continue IV fluids. Fentanyl 50 g every 2 hours as needed for pain. Zofran 4 mg every 6 hours as needed for nausea/vomiting. Famotidine 20 mg IVPB every 12 hours.  Active Problems:  Colonic mass of sigmoid Will likely need biopsy and resection. GI and general surgery evaluation in a.m. Check tumor markers in AM.    Hypothyroidism Resume levothyroxine once tolerating oral intake. TSH monitoring as an outpatient.    Hypertension Hold lisinopril while nothing by mouth. Enalapril 1.25 mg IVP every 6 hours. Monitor blood pressure.    Pre-diabetes Currently nothing by mouth. CBG monitoring every 6 hours.   Lower extremities edema. Check echocardiogram.     Hypokalemia Replacing. Follow-up potassium level in a.m.   DVT prophylaxis: SCDs. Code Status: Full code. Family Communication: Her sister was present in the [place] room. Disposition Plan: Admit for nasogastric suction, symptoms treatment and further evaluation. Consults called: GI Dr. Ardis Hughs and general surgery. Admission status: Inpatient/telemetry.   Reubin Milan MD [facility] Pager [phone]  If 7PM-7AM, please contact night-coverage [email or web address] Password [email or web address]  07/05/2016, 8:12 PM

## 2016-07-05 NOTE — Consult Note (Addendum)
Reason for Consult: Large bowel obstruction Referring Physician: Dr. Ebbie Ridge is an 61 y.o. female.  HPI: This is a 61 year old female transferred from [facility] for management of a large bowel obstruction.  For the past 2 years she reports having intermittent episodes of abdominal distension, n/v/d.  These have escalated recently.  She presented to [facility] for evaluation.  CT scan there demonstrated a large bowel obstruction reportedly due to a sigmoid colon mass.  No fever or chills.  She continues to have loose stools.  She denies abdominal pain.  She states she had a colonoscopy about 2 years ago by Dr. Collene Mares. She is an Recruitment consultant.  She has family members in the room with her.  No previous abdominal surgeries.  I have reviewed her CT scan.  It demonstrates dilated large intestine down to the sigmoid colon area with areas of narrowing.  There is some air and fluid in the rectum.  No obvious liver lesions.  Mildly dilated distal loops of small bowel are seen.  No free air.  Past Medical History:  Diagnosis Date  . Arthritis   . DOE (dyspnea on exertion)    mild  . Hyperlipidemia   . Hypertension   . Hypothyroidism   . Morbid obesity ([place])   . Pre-diabetes   . Schamberg disease     Diverticular disease  Past Surgical History:  Procedure Laterality Date  . TONSILLECTOMY     age 80 yrs    Family History  Problem Relation Age of Onset  . Hypertension Mother   . Heart Problems Mother        Pacemaker  . Heart attack Father        at age 64.  Marland Kitchen Heart attack Maternal Grandfather 29       Died of AAA  . AAA (abdominal aortic aneurysm) Maternal Grandfather   . Heart attack Maternal Uncle   . Stroke Paternal Aunt   . Alzheimer's disease Maternal Grandmother     Social History:  reports that she has never smoked. She has never used smokeless tobacco. She reports that she does not drink alcohol or use drugs.  Allergies: No Known  Allergies  Prior to Admission medications   Medication Sig Start Date End Date Taking? Authorizing Provider  calcium-vitamin D (OSCAL WITH D) 500-200 MG-UNIT per tablet Take 1 tablet by mouth.    [provider]  levothyroxine (SYNTHROID, LEVOTHROID) 112 MCG tablet Take 112 mcg by mouth daily before breakfast.    [provider]  lisinopril (PRINIVIL,ZESTRIL) 20 MG tablet Take 20 mg by mouth daily.    [provider]  potassium chloride SA (K-DUR,KLOR-CON) 20 MEQ tablet Take 20 mEq by mouth 2 (two) times daily.    [provider]  simvastatin (ZOCOR) 20 MG tablet Take 20 mg by mouth every evening.    [provider]  torsemide (DEMADEX) 20 MG tablet Take 20 mg by mouth daily.    [provider]     Results for orders placed or performed during the hospital encounter of 07/05/16 (from the past 48 hour(s))  CBC with Differential/Platelet     Status: None   Collection Time: 07/05/16  6:43 PM  Result Value Ref Range   WBC 6.2 4.0 - 10.5 K/uL   RBC 4.54 3.87 - 5.11 MIL/uL   Hemoglobin 12.6 12.0 - 15.0 g/dL   HCT 38.9 36.0 - 46.0 %   MCV 85.7 78.0 -  100.0 fL   MCH 27.8 26.0 - 34.0 pg   MCHC 32.4 30.0 - 36.0 g/dL   RDW 14.7 11.5 - 15.5 %   Platelets 251 150 - 400 K/uL   Neutrophils Relative % 65 %   Lymphocytes Relative 23 %   Monocytes Relative 10 %   Eosinophils Relative 1 %   Basophils Relative 0 %   Band Neutrophils 0 %   Metamyelocytes Relative 1 %   Myelocytes 0 %   Promyelocytes Absolute 0 %   Blasts 0 %   nRBC 0 0 /100 WBC   Other 0 %   Neutro Abs 4.1 1.7 - 7.7 K/uL   Lymphs Abs 1.4 0.7 - 4.0 K/uL   Monocytes Absolute 0.6 0.1 - 1.0 K/uL   Eosinophils Absolute 0.1 0.0 - 0.7 K/uL   Basophils Absolute 0.0 0.0 - 0.1 K/uL   Smear Review MORPHOLOGY UNREMARKABLE   Comprehensive metabolic panel     Status: Abnormal   Collection Time: 07/05/16  6:43 PM  Result Value Ref Range   Sodium 141 135 - 145 mmol/L   Potassium 3.4  (L) 3.5 - 5.1 mmol/L   Chloride 111 101 - 111 mmol/L   CO2 21 (L) 22 - 32 mmol/L   Glucose, Bld 101 (H) 65 - 99 mg/dL   BUN 14 6 - 20 mg/dL   Creatinine, Ser 0.79 0.44 - 1.00 mg/dL   Calcium 8.3 (L) 8.9 - 10.3 mg/dL   Total Protein 6.9 6.5 - 8.1 g/dL   Albumin 3.5 3.5 - 5.0 g/dL   AST 17 15 - 41 U/L   ALT 14 14 - 54 U/L   Alkaline Phosphatase 61 38 - 126 U/L   Total Bilirubin 0.8 0.3 - 1.2 mg/dL   GFR calc non Af Amer >60 >60 mL/min   GFR calc Af Amer >60 >60 mL/min    Comment: (NOTE) The eGFR has been calculated using the CKD EPI equation. This calculation has not been validated in all clinical situations. eGFR's persistently <60 mL/min signify possible Chronic Kidney Disease.    Anion gap 9 5 - 15  Magnesium     Status: None   Collection Time: 07/05/16  6:43 PM  Result Value Ref Range   Magnesium 1.8 1.7 - 2.4 mg/dL  Phosphorus     Status: None   Collection Time: 07/05/16  6:43 PM  Result Value Ref Range   Phosphorus 3.1 2.5 - 4.6 mg/dL    No results found.  Review of Systems  Constitutional: Positive for weight loss. Negative for chills and fever.  Respiratory: Negative for shortness of breath.   Cardiovascular: Negative for chest pain.  Gastrointestinal: Positive for diarrhea, nausea and vomiting. Negative for abdominal pain and blood in stool.  Endo/Heme/Allergies:       No blood clots or bleeding disorders.   Blood pressure (!) 154/91, pulse (!) 104, temperature 99.7 F (37.6 C), temperature source Oral, resp. rate 18, height _0  (1.626 m), weight 105.2 kg (232 lb), SpO2 99 %. Physical Exam   GENERAL APPEARANCE:  Obese female in NAD.  Pleasant and cooperative.  EARS, NOSE, MOUTH THROAT:  [place]/AT external ears:  no lesions or deformities external nose:  no lesions or deformities hearing:  grossly normal lips:  moist, no deformities EYES external: conjunctiva, lids, sclerae normal pupils:  equal, round glasses: yes  NECK:  Supple, no obvious mass or  thyroid mass/enlargement, no trachea deviation  CV ascultation:  Increased rate, regular rhythm extremity  edema:  1+ pedal edema extremity varicosities:  no  RESP auscultation:  breath sounds equal and clear respiratory effort:  normal   GASTROINTESTINAL abdomen:  Firm, non-tender, distended, no masses liver and spleen:  Unable to examine due to distension. hernia:  none present scar:  none present anorectal:  no external lesions, no fissures, normal sphincter tone, no masses or blood on DRE   MUSCULOSKELETAL digits/nails: no clubbing or cyanosis deformities:  none   LYMPHATIC: No palpable cervical, supraclavicular  adenopathy.  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  DDX= neoplasm or diverticular stricture.  Rec:  GI consultation for sigmoidoscopy/colonoscopy with biopsy and stent placement if possible.  The stent would allow for decompression, bowel prep and one stage procedure.    Gaynell Eggleton J 07/05/2016, 8:53 PM

## 2016-07-06 ENCOUNTER — Inpatient Hospital Stay (HOSPITAL_COMMUNITY): Payer: 59

## 2016-07-06 ENCOUNTER — Encounter (HOSPITAL_COMMUNITY): Payer: Self-pay

## 2016-07-06 ENCOUNTER — Encounter (HOSPITAL_COMMUNITY)
Admission: AD | Disposition: A | Discharge status: Home Health | Payer: Self-pay | Source: Other Acute Inpatient Hospital | Attending: Internal Medicine

## 2016-07-06 DIAGNOSIS — R600 Localized edema: Secondary | ICD-10-CM | POA: Diagnosis present

## 2016-07-06 DIAGNOSIS — I5030 Unspecified diastolic (congestive) heart failure: Secondary | ICD-10-CM

## 2016-07-06 DIAGNOSIS — E876 Hypokalemia: Secondary | ICD-10-CM | POA: Diagnosis present

## 2016-07-06 HISTORY — PX: FLEXIBLE SIGMOIDOSCOPY: SHX5431

## 2016-07-06 LAB — CBC WITH DIFFERENTIAL/PLATELET
BASOS ABS: 0 10*3/uL (ref 0.0–0.1)
Basophils Relative: 0 %
Eosinophils Absolute: 0.1 10*3/uL (ref 0.0–0.7)
Eosinophils Relative: 1 %
HEMATOCRIT: 37.5 % (ref 36.0–46.0)
Hemoglobin: 12.1 g/dL (ref 12.0–15.0)
Lymphocytes Relative: 32 %
Lymphs Abs: 1.6 10*3/uL (ref 0.7–4.0)
MCH: 27.8 pg (ref 26.0–34.0)
MCHC: 32.3 g/dL (ref 30.0–36.0)
MCV: 86 fL (ref 78.0–100.0)
Monocytes Absolute: 1 10*3/uL (ref 0.1–1.0)
Monocytes Relative: 20 %
NEUTROS ABS: 2.4 10*3/uL (ref 1.7–7.7)
Neutrophils Relative %: 47 %
Platelets: 237 10*3/uL (ref 150–400)
RBC: 4.36 MIL/uL (ref 3.87–5.11)
RDW: 14.8 % (ref 11.5–15.5)
WBC: 5 10*3/uL (ref 4.0–10.5)

## 2016-07-06 LAB — GLUCOSE, CAPILLARY
GLUCOSE-CAPILLARY: 97 mg/dL (ref 65–99)
Glucose-Capillary: 95 mg/dL (ref 65–99)
Glucose-Capillary: 75 mg/dL (ref 65–99)
Glucose-Capillary: 70 mg/dL (ref 65–99)

## 2016-07-06 LAB — BASIC METABOLIC PANEL
ANION GAP: 10 (ref 5–15)
BUN: 13 mg/dL (ref 6–20)
CO2: 22 mmol/L (ref 22–32)
Calcium: 8.3 mg/dL — ABNORMAL LOW (ref 8.9–10.3)
Chloride: 111 mmol/L (ref 101–111)
Creatinine, Ser: 0.86 mg/dL (ref 0.44–1.00)
GFR calc Af Amer: >60 mL/min (ref >60)
GFR calc non Af Amer: >60 mL/min (ref >60)
Glucose, Bld: 98 mg/dL (ref 65–99)
POTASSIUM: 3.2 mmol/L — AB (ref 3.5–5.1)
Sodium: 143 mmol/L (ref 135–145)

## 2016-07-06 LAB — ECHOCARDIOGRAM COMPLETE
Height: 64 in
Weight: 3506.2 oz

## 2016-07-06 SURGERY — SIGMOIDOSCOPY, FLEXIBLE
Anesthesia: Moderate Sedation

## 2016-07-06 MED ORDER — MIDAZOLAM HCL 5 MG/ML IJ SOLN
INTRAMUSCULAR | Status: AC
  Filled 2016-07-06: qty 2

## 2016-07-06 MED ORDER — MIDAZOLAM HCL 10 MG/2ML IJ SOLN
INTRAMUSCULAR | Status: DC | PRN
  Administered 2016-07-06: 1 mg via INTRAVENOUS
  Administered 2016-07-06 (×2): 2 mg via INTRAVENOUS

## 2016-07-06 MED ORDER — MORPHINE SULFATE (PF) 4 MG/ML IV SOLN
2.0000 mg | INTRAVENOUS | Status: DC | PRN
  Administered 2016-07-06 – 2016-07-09 (×4): 2 mg via INTRAVENOUS
  Filled 2016-07-06 (×4): qty 1

## 2016-07-06 MED ORDER — KCL IN DEXTROSE-NACL 20-5-0.9 MEQ/L-%-% IV SOLN
INTRAVENOUS | Status: DC
  Administered 2016-07-06 – 2016-07-09 (×7): via INTRAVENOUS
  Filled 2016-07-06 (×9): qty 1000

## 2016-07-06 MED ORDER — FENTANYL CITRATE (PF) 100 MCG/2ML IJ SOLN
INTRAMUSCULAR | Status: AC
  Filled 2016-07-06: qty 2

## 2016-07-06 MED ORDER — FENTANYL CITRATE (PF) 100 MCG/2ML IJ SOLN
INTRAMUSCULAR | Status: DC | PRN
  Administered 2016-07-06 (×2): 25 ug via INTRAVENOUS

## 2016-07-06 MED ORDER — ENALAPRILAT 1.25 MG/ML IV SOLN
1.2500 mg | Freq: Four times a day (QID) | INTRAVENOUS | Status: DC
  Administered 2016-07-06 – 2016-07-11 (×17): 1.25 mg via INTRAVENOUS
  Filled 2016-07-06 (×24): qty 1

## 2016-07-06 NOTE — Progress Notes (Signed)
Initial Nutrition Assessment  DOCUMENTATION CODES:   Obesity unspecified  INTERVENTION:   RD will order supplements when diet advanced   NUTRITION DIAGNOSIS:   Inadequate oral intake related to altered GI function as evidenced by NPO status.  GOAL:   Patient will meet greater than or equal to 90% of their needs  MONITOR:   Diet advancement, Labs, Weight trends  REASON FOR ASSESSMENT:   Malnutrition Screening Tool    ASSESSMENT:    61 y.o. female with medical history significant of osteoarthritis, mild dyspnea on exertion, hyperlipidemia, hypertension, hypothyroidism, morbid obesity, prediabetes, Schamberg disease who is coming from [facility] ED after presenting there with 4-6 weeks of recurrent abdominal pain, on and off nausea/vomiting and diarrhea. CT scan there demonstrated a large bowel obstruction reportedly due to a sigmoid colon mass.    RD unable to speak with pt today as pt was in procedure at time of visit. Pt currently NPO; awaiting GI evaluation. Patient might need sigmoidoscopy/colonoscopy. Pt with NGT in place. RD will order supplements when diet advanced, complete nutrition focused physical exam, and obtain nutrition related history at follow-up. Pt with hypokalemia; monitor and supplement as needed per MD discretion.   Medications reviewed and include: NaCl w/ KCl, pepcid  Labs reviewed: K 3.2(L), Ca 8.3(L)  Unable to complete Nutrition-Focused physical exam at this time.   Diet Order:  Diet NPO time specified  Skin:  Reviewed, no issues  Last BM:  5/25  Height:   Ht Readings from Last 1 Encounters:  07/05/16 5\' 4"  (1.626 m)    Weight:   Wt Readings from Last 1 Encounters:  07/05/16 219 lb 2.2 oz (99.4 kg)    Ideal Body Weight:  54.5 kg  BMI:  Body mass index is 37.61 kg/m.  Estimated Nutritional Needs:   Kcal:  1500-1800kcal/day   Protein:  100-110g/day   Fluid:  >1.5L/day   EDUCATION NEEDS:   Education needs no appropriate at  this time  Koleen Distance MS, RD, LDN Pager #- [phone]

## 2016-07-06 NOTE — Progress Notes (Signed)
  Echocardiogram 2D Echocardiogram has been performed.  [name] 07/06/2016, 10:14 AM

## 2016-07-06 NOTE — Progress Notes (Signed)
Patient had a 2.04 sec pause around the same time NG tube was being placed. Pt has no cardiac or respiratory complaints. MD on call was notified. Will continue to monitor patient.

## 2016-07-06 NOTE — Progress Notes (Signed)
Patient ID: SHANTALE HOLTMEYER, female   DOB: 07/13/1955, 61 y.o.   MRN: [id number]  PROGRESS NOTE    SAYDE LISH  NAT:[id number] DOB: 1955/05/21 DOA: 07/05/2016 PCP: Patient, No Pcp Per   Brief Narrative:   61 y.o. female with medical history significant of osteoarthritis, mild dyspnea on exertion, hyperlipidemia, hypertension, hypothyroidism, morbid obesity, prediabetes, Schamberg disease who presented from [facility] ED after presenting there with 4-6 weeks of recurrent abdominal pain, on and off nausea/vomiting and diarrhea. She was found to have a large bowel obstruction possibly secondary to sigmoid colon mass. General surgery and GI has been called for consult. Patient has an NG tube in place.   Assessment & Plan:   Principal Problem:   SBO (small bowel obstruction) (HCC) Active Problems:   Colonic mass   Hypothyroidism   Hypertension   Pre-diabetes   Hypokalemia   Lower extremities edema   Large bowel obstruction Keep nothing by mouth. NG tube suctioning. Continue IV fluids. Antiemetics and IV analgesics Surgical evaluation appreciated  Probable sigmoid colon mass  Awaiting GI evaluation. Patient might need sigmoidoscopy/colonoscopy    Hypothyroidism Resume levothyroxine once tolerating oral intake. TSH monitoring as an outpatient.   Hypertension Blood pressure stable Hold lisinopril while nothing by mouth. Enalapril 1.25 mg IVP every 6 hours. Monitor blood pressure.    Pre-diabetes Currently nothing by mouth. CBG monitoring every 6 hours.  Lower extremities edema. Check echocardiogram.    Hypokalemia Replacing. Follow-up potassium level in a.m.   DVT prophylaxis: SCDs. Code Status: Full code. Family Communication:  none present at bedside Disposition Plan:  depends on treatment outcome Consultants: GI Dr. Ardis Hughs and general surgery. Procedure: None Antimicrobials: None  Subjective: Patient seen and examined at bedside. She denies current  abdominal pain or vomiting. She is having loose bowel movements.  Objective: Vitals:   07/05/16 2027 07/05/16 2131 07/05/16 2257 07/06/16 0448  BP: (!) 154/91 (!) 146/74 (!) 151/77 123/63  Pulse: (!) 104 99 97 92  Resp:  (!) 28  (!) 24  Temp: 99.7 F (37.6 C) 98.9 F (37.2 C)  98.2 F (36.8 C)  TempSrc: Oral Oral  Oral  SpO2: 99% 97%  97%  Weight:  99.4 kg (219 lb 2.2 oz)    Height:  5\' 4"  (1.626 m)      Intake/Output Summary (Last 24 hours) at 07/06/16 0919 Last data filed at 07/06/16 0700  Gross per 24 hour  Intake              620 ml  Output              200 ml  Net              420 ml   Filed Weights   07/05/16 1832 07/05/16 2131  Weight: 105.2 kg (232 lb) 99.4 kg (219 lb 2.2 oz)    Examination:  General exam: Appears calm and comfortable  Respiratory system: Bilateral decreased breath sound at bases Cardiovascular system: S1 & S2 heard,Rate controlled  Gastrointestinal system: Abdomen is distended, soft and nontender. Bowel sounds sluggish Extremities: No cyanosis, clubbing, 1-2+ pitting pedal edema   Data Reviewed: I have personally reviewed following labs and imaging studies  CBC:  Recent Labs Lab 07/05/16 1843 07/06/16 0441  WBC 6.2 5.0  NEUTROABS 4.1 2.4  HGB 12.6 12.1  HCT 38.9 37.5  MCV 85.7 86.0  PLT 251 427   Basic Metabolic Panel:  Recent Labs Lab 07/05/16 1843 07/06/16 0441  NA 141  143  K 3.4* 3.2*  CL 111 111  CO2 21* 22  GLUCOSE 101* 98  BUN 14 13  CREATININE 0.79 0.86  CALCIUM 8.3* 8.3*  MG 1.8  --   PHOS 3.1  --    GFR: Estimated Creatinine Clearance: 77.7 mL/min (by C-G formula based on SCr of 0.86 mg/dL). Liver Function Tests:  Recent Labs Lab 07/05/16 1843  AST 17  ALT 14  ALKPHOS 61  BILITOT 0.8  PROT 6.9  ALBUMIN 3.5   No results for input(s): LIPASE, AMYLASE in the last 168 hours. No results for input(s): AMMONIA in the last 168 hours. Coagulation Profile: No results for input(s): INR, PROTIME in the last  168 hours. Cardiac Enzymes: No results for input(s): CKTOTAL, CKMB, CKMBINDEX, TROPONINI in the last 168 hours. BNP (last 3 results) No results for input(s): PROBNP in the last 8760 hours. HbA1C: No results for input(s): HGBA1C in the last 72 hours. CBG:  Recent Labs Lab 07/05/16 2112 07/06/16 0122 07/06/16 0612  GLUCAP 97 97 95   Lipid Profile: No results for input(s): CHOL, HDL, LDLCALC, TRIG, CHOLHDL, LDLDIRECT in the last 72 hours. Thyroid Function Tests: No results for input(s): TSH, T4TOTAL, FREET4, T3FREE, THYROIDAB in the last 72 hours. Anemia Panel: No results for input(s): VITAMINB12, FOLATE, FERRITIN, TIBC, IRON, RETICCTPCT in the last 72 hours. Sepsis Labs: No results for input(s): PROCALCITON, LATICACIDVEN in the last 168 hours.  No results found for this or any previous visit (from the past 240 hour(s)).       Radiology Studies: Dg Abd Portable 1v  Result Date: 07/06/2016 CLINICAL DATA:  NG tube placement EXAM: PORTABLE ABDOMEN - 1 VIEW COMPARISON:  CT abdomen and pelvis 07/05/2016.  Abdomen 07/05/2016. FINDINGS: An enteric tube is been placed with tip in the midline upper abdomen, likely in the body of the stomach. Diffuse gaseous distention of the colon consistent with known distal colonic obstruction. IMPRESSION: Enteric tube tip is in mid upper abdomen, likely in the body of the stomach. Electronically Signed   By: Lucienne Capers M.D.   On: 07/06/2016 00:46        Scheduled Meds: . enalaprilat  1.25 mg Intravenous Q6H   Continuous Infusions: . 0.9 % NaCl with KCl 20 mEq / L 50 mL/hr at 07/06/16 0321  . famotidine (PEPCID) IV Stopped (07/06/16 0133)     LOS: 1 day        [name], MD [facility] Pager [phone]  If 7PM-7AM, please contact night-coverage [email or web address] Password [email or web address] 07/06/2016, 9:20 AM

## 2016-07-06 NOTE — Consult Note (Signed)
Reason for Consult: Proximal small bowel obstruction Referring Physician: CCS  [name] HPI: This is a 61 year old female with a PMH of an 8 mm descending colon adenoma, sigmoid diverticula, HTN, hyperlipidemia, hypothyroidism, and Schamberg disease admitted with complaints of abdominal pain and distension.  She initially presented to [facility] with complaints of abdominal pain.  Work in the ER revealed that she had a proximal sigmoid colon obstruction.  She was transferred to [facility] for further evaluation and treatment.  Her symptoms were intermittent over the past several months, but on the night of admission her symptoms markedly worsened.  She is s/p colonoscopy with Dr. Collene Mares on 07/2013 with findings as described above.  An NG tube was placed last evening to help with decompression.  GI was requested to place a stent to facilitate a one step operation.  Past Medical History:  Diagnosis Date  . Arthritis   . DOE (dyspnea on exertion)    mild  . Hyperlipidemia   . Hypertension   . Hypothyroidism   . Morbid obesity ([place])   . Pre-diabetes   . Schamberg disease     Past Surgical History:  Procedure Laterality Date  . TONSILLECTOMY     age 45 yrs    Family History  Problem Relation Age of Onset  . Hypertension Mother   . Heart Problems Mother        Pacemaker  . Heart attack Father        at age 76.  Marland Kitchen Heart attack Maternal Grandfather 49       Died of AAA  . AAA (abdominal aortic aneurysm) Maternal Grandfather   . Heart attack Maternal Uncle   . Stroke Paternal Aunt   . Alzheimer's disease Maternal Grandmother     Social History:  reports that she has never smoked. She has never used smokeless tobacco. She reports that she does not drink alcohol or use drugs.  Allergies: No Known Allergies  Medications:  Scheduled: . [MAR Hold] enalaprilat  1.25 mg Intravenous Q6H   Continuous: . 0.9 % NaCl with KCl 20 mEq / L 50 mL/hr at 07/06/16 0321  .  [MAR Hold] famotidine (PEPCID) IV 20 mg (07/06/16 2979)    Results for orders placed or performed during the hospital encounter of 07/05/16 (from the past 24 hour(s))  CBC with Differential/Platelet     Status: None   Collection Time: 07/05/16  6:43 PM  Result Value Ref Range   WBC 6.2 4.0 - 10.5 K/uL   RBC 4.54 3.87 - 5.11 MIL/uL   Hemoglobin 12.6 12.0 - 15.0 g/dL   HCT 38.9 36.0 - 46.0 %   MCV 85.7 78.0 - 100.0 fL   MCH 27.8 26.0 - 34.0 pg   MCHC 32.4 30.0 - 36.0 g/dL   RDW 14.7 11.5 - 15.5 %   Platelets 251 150 - 400 K/uL   Neutrophils Relative % 65 %   Lymphocytes Relative 23 %   Monocytes Relative 10 %   Eosinophils Relative 1 %   Basophils Relative 0 %   Band Neutrophils 0 %   Metamyelocytes Relative 1 %   Myelocytes 0 %   Promyelocytes Absolute 0 %   Blasts 0 %   nRBC 0 0 /100 WBC   Other 0 %   Neutro Abs 4.1 1.7 - 7.7 K/uL   Lymphs Abs 1.4 0.7 - 4.0 K/uL   Monocytes Absolute 0.6 0.1 - 1.0 K/uL   Eosinophils Absolute 0.1  0.0 - 0.7 K/uL   Basophils Absolute 0.0 0.0 - 0.1 K/uL   Smear Review MORPHOLOGY UNREMARKABLE   Comprehensive metabolic panel     Status: Abnormal   Collection Time: 07/05/16  6:43 PM  Result Value Ref Range   Sodium 141 135 - 145 mmol/L   Potassium 3.4 (L) 3.5 - 5.1 mmol/L   Chloride 111 101 - 111 mmol/L   CO2 21 (L) 22 - 32 mmol/L   Glucose, Bld 101 (H) 65 - 99 mg/dL   BUN 14 6 - 20 mg/dL   Creatinine, Ser 0.79 0.44 - 1.00 mg/dL   Calcium 8.3 (L) 8.9 - 10.3 mg/dL   Total Protein 6.9 6.5 - 8.1 g/dL   Albumin 3.5 3.5 - 5.0 g/dL   AST 17 15 - 41 U/L   ALT 14 14 - 54 U/L   Alkaline Phosphatase 61 38 - 126 U/L   Total Bilirubin 0.8 0.3 - 1.2 mg/dL   GFR calc non Af Amer >60 >60 mL/min   GFR calc Af Amer >60 >60 mL/min   Anion gap 9 5 - 15  Magnesium     Status: None   Collection Time: 07/05/16  6:43 PM  Result Value Ref Range   Magnesium 1.8 1.7 - 2.4 mg/dL  Phosphorus     Status: None   Collection Time: 07/05/16  6:43 PM  Result Value  Ref Range   Phosphorus 3.1 2.5 - 4.6 mg/dL  Glucose, capillary     Status: None   Collection Time: 07/05/16  9:12 PM  Result Value Ref Range   Glucose-Capillary 97 65 - 99 mg/dL  Glucose, capillary     Status: None   Collection Time: 07/06/16  1:22 AM  Result Value Ref Range   Glucose-Capillary 97 65 - 99 mg/dL  CBC with Differential/Platelet     Status: None   Collection Time: 07/06/16  4:41 AM  Result Value Ref Range   WBC 5.0 4.0 - 10.5 K/uL   RBC 4.36 3.87 - 5.11 MIL/uL   Hemoglobin 12.1 12.0 - 15.0 g/dL   HCT 37.5 36.0 - 46.0 %   MCV 86.0 78.0 - 100.0 fL   MCH 27.8 26.0 - 34.0 pg   MCHC 32.3 30.0 - 36.0 g/dL   RDW 14.8 11.5 - 15.5 %   Platelets 237 150 - 400 K/uL   Neutrophils Relative % 47 %   Neutro Abs 2.4 1.7 - 7.7 K/uL   Lymphocytes Relative 32 %   Lymphs Abs 1.6 0.7 - 4.0 K/uL   Monocytes Relative 20 %   Monocytes Absolute 1.0 0.1 - 1.0 K/uL   Eosinophils Relative 1 %   Eosinophils Absolute 0.1 0.0 - 0.7 K/uL   Basophils Relative 0 %   Basophils Absolute 0.0 0.0 - 0.1 K/uL  Basic metabolic panel     Status: Abnormal   Collection Time: 07/06/16  4:41 AM  Result Value Ref Range   Sodium 143 135 - 145 mmol/L   Potassium 3.2 (L) 3.5 - 5.1 mmol/L   Chloride 111 101 - 111 mmol/L   CO2 22 22 - 32 mmol/L   Glucose, Bld 98 65 - 99 mg/dL   BUN 13 6 - 20 mg/dL   Creatinine, Ser 0.86 0.44 - 1.00 mg/dL   Calcium 8.3 (L) 8.9 - 10.3 mg/dL   GFR calc non Af Amer >60 >60 mL/min   GFR calc Af Amer >60 >60 mL/min   Anion gap 10 5 - 15  Glucose, capillary     Status: None   Collection Time: 07/06/16  6:12 AM  Result Value Ref Range   Glucose-Capillary 95 65 - 99 mg/dL     Dg Abd Portable 1v  Result Date: 07/06/2016 CLINICAL DATA:  NG tube placement EXAM: PORTABLE ABDOMEN - 1 VIEW COMPARISON:  CT abdomen and pelvis 07/05/2016.  Abdomen 07/05/2016. FINDINGS: An enteric tube is been placed with tip in the midline upper abdomen, likely in the body of the stomach. Diffuse  gaseous distention of the colon consistent with known distal colonic obstruction. IMPRESSION: Enteric tube tip is in mid upper abdomen, likely in the body of the stomach. Electronically Signed   By: Lucienne Capers M.D.   On: 07/06/2016 00:46    ROS:  As stated above in the HPI otherwise negative.  Blood pressure 123/63, pulse 92, temperature 98.2 F (36.8 C), temperature source Oral, resp. rate (!) 24, height 5\' 4"  (1.626 m), weight 99.4 kg (219 lb 2.2 oz), SpO2 97 %.    PE: Gen: NAD, Alert and Oriented HEENT:  [place]/AT, EOMI Neck: Supple, no LAD Lungs: CTA Bilaterally CV: RRR without M/G/R ABM: Distended, moderately firm, tympanic Ext: No C/C/E  Assessment/Plan: 1) Proximal sigmoid colon obstruction. 2) ABM pain. 3) Diverticular disease.   There is suggestion of a mass in the sigmoid colon as reported by the CT scan.  I do not have the scan available for my evaluation, but clinically she is dilated.  I will pursue a FFS with possible stent placement.    Plan: 1) FFS with possible stent placement.  Brylyn Novakovich D 07/06/2016, 11:39 AM

## 2016-07-06 NOTE — Op Note (Signed)
[facility] Patient Name: [name] Procedure Date: 07/06/2016 MRN: [id number] Attending MD: Carol Ada , MD Date of Birth: 04/24/1955 CSN: [id number] Age: 61 Admit Type: Inpatient Procedure:                Flexible Sigmoidoscopy Indications:              Abnormal CT of the GI tract Providers:                Carol Ada, MD, Burtis Junes, RN, William Dalton,                            Technician Referring MD:              Medicines:                Fentanyl 50 micrograms IV, Midazolam 5 mg IV Complications:            No immediate complications. Estimated Blood Loss:     Estimated blood loss: none. Procedure:                Pre-Anesthesia Assessment:                           - Prior to the procedure, a History and Physical                            was performed, and patient medications and                            allergies were reviewed. The patient's tolerance of                            previous anesthesia was also reviewed. The risks                            and benefits of the procedure and the sedation                            options and risks were discussed with the patient.                            All questions were answered, and informed consent                            was obtained. Prior Anticoagulants: The patient has                            taken no previous anticoagulant or antiplatelet                            agents. ASA Grade Assessment: II - A patient with                            mild systemic disease. After reviewing the risks  and benefits, the patient was deemed in                            satisfactory condition to undergo the procedure.                           - Sedation was administered by an endoscopy nurse.                            The sedation level attained was moderate.                           After obtaining informed consent, the scope was                            passed  under direct vision. The [id number] ([id number])                            scope was introduced through the anus and advanced                            to the the descending colon. The Endoscope was                            introduced through the and advanced to the. The                            flexible sigmoidoscopy was technically difficult                            and complex. The quality of the bowel preparation                            was adequate. Scope In: 12:15:00 PM Scope Out: 12:44:07 PM Total Procedure Duration: 0 hours 29 minutes 7 seconds  Findings:      A benign-appearing, intrinsic severe stenosis measuring 4 cm (in length)       x 7 mm (inner diameter) was found in the sigmoid colon and was       traversed. This was stented with a 22 mm x 9 cm WallFlex stent under       fluoroscopic guidance. Estimated blood loss: none.      Multiple medium-mouthed diverticula were found in the sigmoid colon.      The diverticular stricture was approximately 4 cm in length. The area       was torturous, but no resistance was encountered with traversing the       stenosis. At the proximal portion of the stricture the proximal colon       was markedly dilated. Attempts were made to decompress the area and       suction out the liquid stool with some benefit. Using fluoroscopy the       proximal and distal extent of the stenosis was marked off externally       with radiopque markers. Using a combination of a guidewire and direct       endoscopic visualization  the 22 x 90 mm stent was successfully deployed.       There was a 2-2.5 cm overhang of the stent on the proximal protion of       the stricture. This was confirmed endoscopically and fluoroscopically.       The endoscope was able to traverse the stent lumen with relative ease       and there was free flow of liquid stool through the stenosis. Impression:               - Stricture in the sigmoid colon. Prosthesis placed.                            - Diverticulosis in the sigmoid colon.                           - No specimens collected. Moderate Sedation:      Moderate (conscious) sedation was administered by the endoscopy nurse       and supervised by the endoscopist. The following parameters were       monitored: oxygen saturation, heart rate, blood pressure, and response       to care. Recommendation:           - Return patient to hospital ward for ongoing care.                           - NPO.                           - Further management per surgery. Procedure Code(s):        --- Professional ---                           [phone], Sigmoidoscopy, flexible; with placement of                            endoscopic stent (includes pre- and post-dilation                            and guide wire passage, when performed)                           [id number], Intraluminal dilation of strictures and/or                            obstructions (eg, esophagus), radiological                            supervision and interpretation Diagnosis Code(s):        --- Professional ---                           K56.69, Other intestinal obstruction                           K57.30, Diverticulosis of large intestine without  perforation or abscess without bleeding                           R93.3, Abnormal findings on diagnostic imaging of                            other parts of digestive tract CPT copyright 2016 [employer]. All rights reserved. The codes documented in this report are preliminary and upon coder review may  be revised to meet current compliance requirements. Carol Ada, MD Carol Ada, MD 07/06/2016 1:01:33 PM This report has been signed electronically. Number of Addenda: 0

## 2016-07-06 NOTE — Progress Notes (Signed)
   Subjective/Chief Complaint: Feels a little less bloated with NG in place. Continues to have loose bowel movements   Objective: Vital signs in last 24 hours: Temp:  [98 F (36.7 C)-99.7 F (37.6 C)] 98.2 F (36.8 C) (05/25 0448) Pulse Rate:  [92-107] 92 (05/25 0448) Resp:  [18-28] 24 (05/25 0448) BP: (123-154)/(60-91) 123/63 (05/25 0448) SpO2:  [97 %-99 %] 97 % (05/25 0448) Weight:  [99.4 kg (219 lb 2.2 oz)-105.2 kg (232 lb)] 99.4 kg (219 lb 2.2 oz) (05/24 2131) Last BM Date: 07/06/16  Intake/Output from previous day: 05/24 0701 - 05/25 0700 In: 620 [I.V.:520; IV Piggyback:100] Out: 200 [Emesis/NG output:200] Intake/Output this shift: No intake/output data recorded.  General appearance: alert and cooperative Resp: unlabored respirations GI: distended, firm but compressible, nontender.  Neurologic: Grossly normal  Lab Results:   Recent Labs  07/05/16 1843 07/06/16 0441  WBC 6.2 5.0  HGB 12.6 12.1  HCT 38.9 37.5  PLT 251 237   BMET  Recent Labs  07/05/16 1843 07/06/16 0441  NA 141 143  K 3.4* 3.2*  CL 111 111  CO2 21* 22  GLUCOSE 101* 98  BUN 14 13  CREATININE 0.79 0.86  CALCIUM 8.3* 8.3*   PT/INR No results for input(s): LABPROT, INR in the last 72 hours. ABG No results for input(s): PHART, HCO3 in the last 72 hours.  Invalid input(s): PCO2, PO2  Studies/Results: Dg Abd Portable 1v  Result Date: 07/06/2016 CLINICAL DATA:  NG tube placement EXAM: PORTABLE ABDOMEN - 1 VIEW COMPARISON:  CT abdomen and pelvis 07/05/2016.  Abdomen 07/05/2016. FINDINGS: An enteric tube is been placed with tip in the midline upper abdomen, likely in the body of the stomach. Diffuse gaseous distention of the colon consistent with known distal colonic obstruction. IMPRESSION: Enteric tube tip is in mid upper abdomen, likely in the body of the stomach. Electronically Signed   By: Lucienne Capers M.D.   On: 07/06/2016 00:46    Anti-infectives: Anti-infectives    None      Assessment/Plan: s/p Procedure(s): FLEXIBLE SIGMOIDOSCOPY (N/A) High grade partial obstruction at sigmoid- diverticular stricture vs neoplasm. Appreciate assistance from Dr. Benson Norway- if a stent can be placed for decompression this would allow her a one-stage operation. If this is not successful plan will be laparotomy and [facility].   LOS: 1 day    [name] 07/06/2016

## 2016-07-07 ENCOUNTER — Encounter (HOSPITAL_COMMUNITY): Payer: Self-pay | Admitting: Gastroenterology

## 2016-07-07 LAB — CEA: CEA: 1.7 ng/mL (ref 0.0–4.7)

## 2016-07-07 LAB — CBC WITH DIFFERENTIAL/PLATELET
BASOS ABS: 0 10*3/uL (ref 0.0–0.1)
BASOS PCT: 0 %
EOS ABS: 0.1 10*3/uL (ref 0.0–0.7)
Eosinophils Relative: 1 %
HCT: 34.9 % — ABNORMAL LOW (ref 36.0–46.0)
Hemoglobin: 11.4 g/dL — ABNORMAL LOW (ref 12.0–15.0)
Lymphocytes Relative: 18 %
Lymphs Abs: 1.6 10*3/uL (ref 0.7–4.0)
MCH: 28.1 pg (ref 26.0–34.0)
MCHC: 32.7 g/dL (ref 30.0–36.0)
MCV: 86.2 fL (ref 78.0–100.0)
Monocytes Absolute: 1 10*3/uL (ref 0.1–1.0)
Monocytes Relative: 11 %
Neutro Abs: 6 10*3/uL (ref 1.7–7.7)
Neutrophils Relative %: 70 %
PLATELETS: 208 10*3/uL (ref 150–400)
RBC: 4.05 MIL/uL (ref 3.87–5.11)
RDW: 14.9 % (ref 11.5–15.5)
WBC: 8.7 10*3/uL (ref 4.0–10.5)

## 2016-07-07 LAB — BASIC METABOLIC PANEL
ANION GAP: 8 (ref 5–15)
BUN: 11 mg/dL (ref 6–20)
CO2: 21 mmol/L — ABNORMAL LOW (ref 22–32)
Calcium: 8 mg/dL — ABNORMAL LOW (ref 8.9–10.3)
Chloride: 113 mmol/L — ABNORMAL HIGH (ref 101–111)
Creatinine, Ser: 0.82 mg/dL (ref 0.44–1.00)
Glucose, Bld: 81 mg/dL (ref 65–99)
POTASSIUM: 3.1 mmol/L — AB (ref 3.5–5.1)
SODIUM: 142 mmol/L (ref 135–145)

## 2016-07-07 LAB — GLUCOSE, CAPILLARY
GLUCOSE-CAPILLARY: 70 mg/dL (ref 65–99)
GLUCOSE-CAPILLARY: 98 mg/dL (ref 65–99)
Glucose-Capillary: 88 mg/dL (ref 65–99)

## 2016-07-07 LAB — AFP TUMOR MARKER: AFP-Tumor Marker: 1.1 ng/mL (ref 0.0–8.3)

## 2016-07-07 MED ORDER — HEPARIN SODIUM (PORCINE) 5000 UNIT/ML IJ SOLN
5000.0000 [IU] | Freq: Three times a day (TID) | INTRAMUSCULAR | Status: DC
  Administered 2016-07-07 (×2): 5000 [IU] via SUBCUTANEOUS
  Filled 2016-07-07 (×2): qty 1

## 2016-07-07 MED ORDER — PANTOPRAZOLE SODIUM 40 MG IV SOLR
40.0000 mg | Freq: Two times a day (BID) | INTRAVENOUS | Status: DC
  Administered 2016-07-07 – 2016-07-09 (×5): 40 mg via INTRAVENOUS
  Filled 2016-07-07 (×6): qty 40

## 2016-07-07 MED ORDER — POTASSIUM CHLORIDE CRYS ER 20 MEQ PO TBCR
40.0000 meq | EXTENDED_RELEASE_TABLET | Freq: Two times a day (BID) | ORAL | Status: DC
  Administered 2016-07-07 – 2016-07-10 (×7): 40 meq via ORAL
  Filled 2016-07-07 (×7): qty 2

## 2016-07-07 NOTE — Progress Notes (Signed)
Patient ID: LANNA LABELLA, female   DOB: October 09, 1955, 61 y.o.   MRN: [id number]  PROGRESS NOTE    SHAKIYA MCNEARY  KPT:[id number] DOB: 1955/08/25 DOA: 07/05/2016 PCP: Patient, No Pcp Per   Brief Narrative:  61 y.o.femalewith medical history significant of osteoarthritis, mild dyspnea on exertion, hyperlipidemia, hypertension, hypothyroidism, morbid obesity, prediabetes, Schamberg disease who presented from [facility] ED after presenting there with 4-6 weeks of recurrent abdominal pain, on and off nausea/vomiting and diarrhea. She was found to have a large bowel obstruction possibly secondary to sigmoid colon mass. General surgery and GI has been called for consult. Patient has an NG tube in place. She had flexible sigmoidoscopy with stent placement on 07/06/2016 by Dr. Benson Norway.   Assessment & Plan:   Principal Problem:   SBO (small bowel obstruction) (HCC) Active Problems:   Colonic mass   Hypothyroidism   Hypertension   Pre-diabetes   Hypokalemia   Lower extremities edema   Large bowel obstruction Secondary to colonic stricture status post stent placement. Patient currently having bowel movements. Follow-up from general surgery is appreciated. Continue NG tube.  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 Timing of surgery but decided by general surgery  Hypothyroidism Resume levothyroxine once tolerating oral intake. TSH monitoring as an outpatient.  Hypertension Blood pressure stable Hold lisinopril while nothing by mouth. Enalapril 1.25 mg IVP every 6 hours. Monitor blood pressure.  Pre-diabetes Currently nothing by mouth. CBG monitoring every 6 hours.  Lower extremities edema. Check echocardiogram.  Hypokalemia Replacing. Follow-up potassium level in a.m. Check magnesium in a.m.   DVT prophylaxis:SCDs. Start heparin Code Status:Full code. Family Communication:  spoke to her niece present at bedside Disposition  Plan: depends on treatment outcome Consultants:GI  and general surgery. Procedure: Sigmoidoscopy on 07/06/2016 which showed sigmoid colon stricture status post stent placement Antimicrobials: None  Subjective: Patient seen and examined at bedside. She denies any overnight fever, nausea, vomiting. She is having some diarrhea. No current abdominal pain  Objective: Vitals:   07/06/16 1310 07/06/16 1610 07/06/16 2035 07/07/16 0536  BP: (!) 107/52 (!) 148/69 116/62 (!) 156/69  Pulse:  87 88 93  Resp:  16 20 20   Temp:  98.1 F (36.7 C) 99 F (37.2 C) 97.8 F (36.6 C)  TempSrc:  Axillary Axillary Oral  SpO2:  99% 98% 98%  Weight:      Height:        Intake/Output Summary (Last 24 hours) at 07/07/16 1142 Last data filed at 07/07/16 1000  Gross per 24 hour  Intake           938.34 ml  Output              550 ml  Net           388.34 ml   Filed Weights   07/05/16 1832 07/05/16 2131  Weight: 105.2 kg (232 lb) 99.4 kg (219 lb 2.2 oz)    Examination:  General exam: Appears calm and comfortable  Respiratory system: Bilateral decreased breath sound at bases Cardiovascular system: S1 & S2 heard, Rate controlled  Gastrointestinal system: Abdomen is nondistended, soft and nontender. Bowel sounds heard  Extremities: No cyanosis, clubbing; 1-2+ pitting pedal edema   Data Reviewed: I have personally reviewed following labs and imaging studies  CBC:  Recent Labs Lab 07/05/16 1843 07/06/16 0441 07/07/16 0536  WBC 6.2 5.0 8.7  NEUTROABS 4.1 2.4 6.0  HGB 12.6 12.1 11.4*  HCT 38.9 37.5 34.9*  MCV 85.7 86.0 86.2  PLT 251 237 989   Basic Metabolic Panel:  Recent Labs Lab 07/05/16 1843 07/06/16 0441 07/07/16 0536  NA 141 143 142  K 3.4* 3.2* 3.1*  CL 111 111 113*  CO2 21* 22 21*  GLUCOSE 101* 98 81  BUN 14 13 11   CREATININE 0.79 0.86 0.82  CALCIUM 8.3* 8.3* 8.0*  MG 1.8  --   --   PHOS 3.1  --   --    GFR: Estimated Creatinine Clearance: 80.5 mL/min (by C-G formula  based on SCr of 0.82 mg/dL). Liver Function Tests:  Recent Labs Lab 07/05/16 1843  AST 17  ALT 14  ALKPHOS 61  BILITOT 0.8  PROT 6.9  ALBUMIN 3.5   No results for input(s): LIPASE, AMYLASE in the last 168 hours. No results for input(s): AMMONIA in the last 168 hours. Coagulation Profile: No results for input(s): INR, PROTIME in the last 168 hours. Cardiac Enzymes: No results for input(s): CKTOTAL, CKMB, CKMBINDEX, TROPONINI in the last 168 hours. BNP (last 3 results) No results for input(s): PROBNP in the last 8760 hours. HbA1C: No results for input(s): HGBA1C in the last 72 hours. CBG:  Recent Labs Lab 07/06/16 0122 07/06/16 0612 07/06/16 1343 07/06/16 1929 07/07/16 0005  GLUCAP 97 95 75 70 70   Lipid Profile: No results for input(s): CHOL, HDL, LDLCALC, TRIG, CHOLHDL, LDLDIRECT in the last 72 hours. Thyroid Function Tests: No results for input(s): TSH, T4TOTAL, FREET4, T3FREE, THYROIDAB in the last 72 hours. Anemia Panel: No results for input(s): VITAMINB12, FOLATE, FERRITIN, TIBC, IRON, RETICCTPCT in the last 72 hours. Sepsis Labs: No results for input(s): PROCALCITON, LATICACIDVEN in the last 168 hours.  No results found for this or any previous visit (from the past 240 hour(s)).       Radiology Studies: Dg Abd 1 View  Result Date: 07/06/2016 CLINICAL DATA:  Intraoperative fluoroscopy.  Sigmoid obstruction. EXAM: ABDOMEN - 1 VIEW COMPARISON:  Abdominal CT from yesterday FINDINGS: Fluoroscopy shows wire and then stent placement over the pelvis correlating with history of sigmoid stenting. Diffuse gas dilated colon. IMPRESSION: Fluoroscopy for sigmoid stenting. Electronically Signed   By: Monte Fantasia M.D.   On: 07/06/2016 13:27   Dg Abd Portable 1v  Result Date: 07/06/2016 CLINICAL DATA:  NG tube placement EXAM: PORTABLE ABDOMEN - 1 VIEW COMPARISON:  CT abdomen and pelvis 07/05/2016.  Abdomen 07/05/2016. FINDINGS: An enteric tube is been placed with tip  in the midline upper abdomen, likely in the body of the stomach. Diffuse gaseous distention of the colon consistent with known distal colonic obstruction. IMPRESSION: Enteric tube tip is in mid upper abdomen, likely in the body of the stomach. Electronically Signed   By: Lucienne Capers M.D.   On: 07/06/2016 00:46   Dg C-arm 1-60 Min-no Report  Result Date: 07/06/2016 Fluoroscopy was utilized by the requesting physician.  No radiographic interpretation.        Scheduled Meds: . enalaprilat  1.25 mg Intravenous Q6H  . heparin subcutaneous  5,000 Units Subcutaneous Q8H  . potassium chloride SA  40 mEq Oral BID   Continuous Infusions: . dextrose 5 % and 0.9 % NaCl with KCl 20 mEq/L 100 mL/hr at 07/07/16 0752  . famotidine (PEPCID) IV Stopped (07/07/16 0931)     LOS: 2 days        [name], MD [facility] Pager [phone]  If 7PM-7AM, please contact night-coverage [email or web address] Password [facility] 07/07/2016, 11:42 AM

## 2016-07-07 NOTE — Progress Notes (Signed)
Patient's NGT drainage very bright red in appearance when receiving report; drainage is now a darker red/ brown color. No other bleeding noted. MD Opyd notified who gave VO to hold SQ Heparin and apply SCDs. Other orders to be placed. Will continue to monitor.

## 2016-07-07 NOTE — Plan of Care (Signed)
Problem: Nutrition: Goal: Adequate nutrition will be maintained Outcome: Progressing Ice chip

## 2016-07-07 NOTE — Progress Notes (Signed)
[facility] Office:  [phone] General Surgery Progress Note   LOS: 2 days  POD -  1 Day Post-Op  Chief Complaint: Nausea and vomiting  Assessment and Plan: 1.  Colonic stricture - proximal sigmoid colon  FLEXIBLE SIGMOIDOSCOPY with stent placement - 07/06/2016 - Hung  Continue NGT for now - will try gentle bowel prep - she's having stools  Consider colectomy this week - hopefully as one stage procedure  2.  HTN 3.  DVT prophylaxis - To start SQ heparin 4.  Hypokalemia  K+ - 3.1 - to replace orally   Principal Problem:   SBO (small bowel obstruction) (HCC) Active Problems:   Colonic mass   Hypothyroidism   Hypertension   Pre-diabetes   Hypokalemia   Lower extremities edema  Subjective:  Doing better.  Having BM's  Lives near [address] - unmarried - lives with father.  Objective:   Vitals:   07/06/16 2035 07/07/16 0536  BP: 116/62 (!) 156/69  Pulse: 88 93  Resp: 20 20  Temp: 99 F (37.2 C) 97.8 F (36.6 C)     Intake/Output from previous day:  05/25 0701 - 05/26 0700 In: 881.7 [P.O.:60; I.V.:721.7; IV Piggyback:100] Out: 553 [Urine:1; Emesis/NG output:550; Stool:2]  Intake/Output this shift:  No intake/output data recorded.   Physical Exam:   General: Obese WF who is alert and oriented.    HEENT: Normal. Pupils equal. .   Lungs: Clear   Abdomen: Mild distention.  Has a few BS.   Lab Results:    Recent Labs  07/06/16 0441 07/07/16 0536  WBC 5.0 8.7  HGB 12.1 11.4*  HCT 37.5 34.9*  PLT 237 208    BMET   Recent Labs  07/06/16 0441 07/07/16 0536  NA 143 142  K 3.2* 3.1*  CL 111 113*  CO2 22 21*  GLUCOSE 98 81  BUN 13 11  CREATININE 0.86 0.82  CALCIUM 8.3* 8.0*    PT/INR  No results for input(s): LABPROT, INR in the last 72 hours.  ABG  No results for input(s): PHART, HCO3 in the last 72 hours.  Invalid input(s): PCO2, PO2   Studies/Results:  Dg Abd 1 View  Result Date: 07/06/2016 CLINICAL DATA:  Intraoperative  fluoroscopy.  Sigmoid obstruction. EXAM: ABDOMEN - 1 VIEW COMPARISON:  Abdominal CT from yesterday FINDINGS: Fluoroscopy shows wire and then stent placement over the pelvis correlating with history of sigmoid stenting. Diffuse gas dilated colon. IMPRESSION: Fluoroscopy for sigmoid stenting. Electronically Signed   By: Monte Fantasia M.D.   On: 07/06/2016 13:27   Dg Abd Portable 1v  Result Date: 07/06/2016 CLINICAL DATA:  NG tube placement EXAM: PORTABLE ABDOMEN - 1 VIEW COMPARISON:  CT abdomen and pelvis 07/05/2016.  Abdomen 07/05/2016. FINDINGS: An enteric tube is been placed with tip in the midline upper abdomen, likely in the body of the stomach. Diffuse gaseous distention of the colon consistent with known distal colonic obstruction. IMPRESSION: Enteric tube tip is in mid upper abdomen, likely in the body of the stomach. Electronically Signed   By: Lucienne Capers M.D.   On: 07/06/2016 00:46   Dg C-arm 1-60 Min-no Report  Result Date: 07/06/2016 Fluoroscopy was utilized by the requesting physician.  No radiographic interpretation.     Anti-infectives:   Anti-infectives    None      Alphonsa Overall, MD, FACS Pager: [phone] Surgery Office: [phone] 07/07/2016

## 2016-07-08 LAB — BASIC METABOLIC PANEL
ANION GAP: 8 (ref 5–15)
BUN: 7 mg/dL (ref 6–20)
CHLORIDE: 111 mmol/L (ref 101–111)
CO2: 20 mmol/L — ABNORMAL LOW (ref 22–32)
Calcium: 7.8 mg/dL — ABNORMAL LOW (ref 8.9–10.3)
Creatinine, Ser: 0.8 mg/dL (ref 0.44–1.00)
GFR calc non Af Amer: >60 mL/min (ref >60)
Glucose, Bld: 98 mg/dL (ref 65–99)
POTASSIUM: 3.6 mmol/L (ref 3.5–5.1)
SODIUM: 139 mmol/L (ref 135–145)

## 2016-07-08 LAB — GLUCOSE, CAPILLARY
GLUCOSE-CAPILLARY: 108 mg/dL — AB (ref 65–99)
GLUCOSE-CAPILLARY: 139 mg/dL — AB (ref 65–99)
Glucose-Capillary: 101 mg/dL — ABNORMAL HIGH (ref 65–99)
Glucose-Capillary: 109 mg/dL — ABNORMAL HIGH (ref 65–99)

## 2016-07-08 LAB — MAGNESIUM: MAGNESIUM: 1.9 mg/dL (ref 1.7–2.4)

## 2016-07-08 LAB — HEMOGLOBIN AND HEMATOCRIT, BLOOD
HCT: 33.6 % — ABNORMAL LOW (ref 36.0–46.0)
Hemoglobin: 10.9 g/dL — ABNORMAL LOW (ref 12.0–15.0)

## 2016-07-08 MED ORDER — POLYETHYLENE GLYCOL 3350 17 G PO PACK
17.0000 g | PACK | Freq: Three times a day (TID) | ORAL | Status: DC
  Administered 2016-07-08 – 2016-07-10 (×7): 17 g via ORAL
  Filled 2016-07-08 (×7): qty 1

## 2016-07-08 NOTE — Progress Notes (Signed)
Per orders: started pt on clear liquids which she tolerated well. After 7hrs w no complaints of nausea, vomitting, bloating  NG tube was removed. Pt tolerated well. Will continue to monitor.

## 2016-07-08 NOTE — Progress Notes (Signed)
[facility] Office:  [phone] General Surgery Progress Note   LOS: 3 days  POD -  2 Days Post-Op  Chief Complaint: Nausea and vomiting  Assessment and Plan: 1.  Colonic stricture - proximal sigmoid colon  FLEXIBLE SIGMOIDOSCOPY with stent placement - 07/06/2016 Benson Norway  Consider colectomy this week - hopefully as one stage procedure  Start clear liquids.  Give Miralax TID.  Ambulate.  2.  HTN 3.  DVT prophylaxis - To start SQ heparin 4.  Hypokalemia  K+ - 3.6   Principal Problem:   SBO (small bowel obstruction) (HCC) Active Problems:   Colonic mass   Hypothyroidism   Hypertension   Pre-diabetes   Hypokalemia   Lower extremities edema  Subjective:  No nausea or abdominal pain.  Lives near [place] - unmarried - lives with father.  I talked to sister, on phone.  Objective:   Vitals:   07/08/16 0021 07/08/16 0515  BP: 132/63 (!) 141/64  Pulse: 91 87  Resp:  16  Temp:  99 F (37.2 C)     Intake/Output from previous day:  05/26 0701 - 05/27 0700 In: 1905.4 [I.V.:1855.4; IV Piggyback:50] Out: 500 [Emesis/NG output:500]  Intake/Output this shift:  No intake/output data recorded.   Physical Exam:   General: Obese WF who is alert and oriented.  Has NGT.   HEENT: Normal. Pupils equal. .   Lungs: Clear   Abdomen: Mild distention.  Has a few BS.   Lab Results:     Recent Labs  07/06/16 0441 07/07/16 0536 07/08/16 0056  WBC 5.0 8.7  --   HGB 12.1 11.4* 10.9*  HCT 37.5 34.9* 33.6*  PLT 237 208  --     BMET    Recent Labs  07/07/16 0536 07/08/16 0056  NA 142 139  K 3.1* 3.6  CL 113* 111  CO2 21* 20*  GLUCOSE 81 98  BUN 11 7  CREATININE 0.82 0.80  CALCIUM 8.0* 7.8*    PT/INR  No results for input(s): LABPROT, INR in the last 72 hours.  ABG  No results for input(s): PHART, HCO3 in the last 72 hours.  Invalid input(s): PCO2, PO2   Studies/Results:  Dg Abd 1 View  Result Date: 07/06/2016 CLINICAL DATA:  Intraoperative  fluoroscopy.  Sigmoid obstruction. EXAM: ABDOMEN - 1 VIEW COMPARISON:  Abdominal CT from yesterday FINDINGS: Fluoroscopy shows wire and then stent placement over the pelvis correlating with history of sigmoid stenting. Diffuse gas dilated colon. IMPRESSION: Fluoroscopy for sigmoid stenting. Electronically Signed   By: Monte Fantasia M.D.   On: 07/06/2016 13:27   Dg C-arm 1-60 Min-no Report  Result Date: 07/06/2016 Fluoroscopy was utilized by the requesting physician.  No radiographic interpretation.     Anti-infectives:   Anti-infectives    None      Alphonsa Overall, MD, FACS Pager: [phone] Surgery Office: [phone] 07/08/2016

## 2016-07-08 NOTE — Progress Notes (Signed)
Patient ID: [name], female   DOB: [birth year], 61 y.o.   MRN: [id number]  PROGRESS NOTE    [name]  YIF:[id number] DOB: 1955-12-14 DOA: 07/05/2016 PCP: Patient, No Pcp Per   Brief Narrative:  61 y.o.femalewith medical history significant of osteoarthritis, mild dyspnea on exertion, hyperlipidemia, hypertension, hypothyroidism, morbid obesity, prediabetes, Schamberg disease who [facility] ED after presenting there with 4-6 weeks of recurrent abdominal pain, on and off nausea/vomiting and diarrhea. She was found to have a large bowel obstruction possibly secondary to sigmoid colon mass. General surgery and GI has been called for consult. Patient has an NG tube in place. She had flexible sigmoidoscopy with stent placement on 07/06/2016 by Dr. Benson Norway.    Assessment & Plan:   Principal Problem:   SBO (small bowel obstruction) (HCC) Active Problems:   Colonic mass   Hypothyroidism   Hypertension   Pre-diabetes   Hypokalemia   Lower extremities edema  Large bowel obstruction Secondary to colonic stricture status post stent placement. Patient currently having bowel movements. Follow-up from general surgery is appreciated. Continue NG tube. Patient apparently had minimal right-sided NG tube drainage which has changed to dark brown in color. Lovenox has been held. Hemoglobin stable.  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 Timing of surgery to be decided by general surgery  Hypothyroidism Resume levothyroxine once tolerating oral intake. TSH monitoring as an outpatient.  Hypertension Blood pressure stable Hold lisinopril while nothing by mouth. Enalapril 1.25 mg IVP every 6 hours. Monitor blood pressure.  Pre-diabetes Currently nothing by mouth. CBG monitoring every 6 hours.  Lower extremities edema. Echo shows normal LV function with grade 1 diastolic dysfunction  Hypokalemia Improved. Repeat a.m.  labs  DVT prophylaxis:SCDs.  Code Status:Full code. Family Communication:  none at bedside Disposition Plan:depends on treatment outcome Consultants:GI  and general surgery. Procedure: Sigmoidoscopy on 07/06/2016 which showed sigmoid colon stricture status post stent placement Echo: On 07/06/2016:Study Conclusions  - Left ventricle: The cavity size was normal. Wall thickness was   increased in a pattern of mild LVH. Systolic function was normal.   The estimated ejection fraction was in the range of 60% to 65%.   Wall motion was normal; there were no regional wall motion   abnormalities. Doppler parameters are consistent with abnormal   left ventricular relaxation (grade 1 diastolic dysfunction). - Mitral valve: Calcified annulus. Mildly thickened leaflets .  Impressions:  - Normal LV systolic function; mild diastolic dysfunction; mild   LVH.  Antimicrobials:None  Subjective: Patient seen and examined at bedside. She denies any overnight fever, vomiting. No abdominal pain.  Objective: Vitals:   07/07/16 2035 07/08/16 0021 07/08/16 0515 07/08/16 0941  BP: (!) 152/71 132/63 (!) 141/64 (!) 134/56  Pulse: 93 91 87 96  Resp: 18  16 20   Temp: 99 F (37.2 C)  99 F (37.2 C) 99.8 F (37.7 C)  TempSrc: Oral  Oral Oral  SpO2: 94%  98% 97%  Weight:      Height:        Intake/Output Summary (Last 24 hours) at 07/08/16 1344 Last data filed at 07/08/16 0630  Gross per 24 hour  Intake          1598.75 ml  Output              500 ml  Net          1098.75 ml   Filed Weights   07/05/16 1832 07/05/16 2131  Weight:  105.2 kg (232 lb) 99.4 kg (219 lb 2.2 oz)    Examination:  General exam: Appears calm and comfortable; NG tube in place Respiratory system: Bilateral decreased breath sound at bases Cardiovascular system: S1 & S2 heard, Rate controlled Gastrointestinal system: Abdomen is distended, soft and nontender. Normal bowel sounds heard. Extremities: No cyanosis,  clubbing, 1-2+ pitting pedal edema     Data Reviewed: I have personally reviewed following labs and imaging studies  CBC:  Recent Labs Lab 07/05/16 1843 07/06/16 0441 07/07/16 0536 07/08/16 0056  WBC 6.2 5.0 8.7  --   NEUTROABS 4.1 2.4 6.0  --   HGB 12.6 12.1 11.4* 10.9*  HCT 38.9 37.5 34.9* 33.6*  MCV 85.7 86.0 86.2  --   PLT 251 237 208  --    Basic Metabolic Panel:  Recent Labs Lab 07/05/16 1843 07/06/16 0441 07/07/16 0536 07/08/16 0056  NA 141 143 142 139  K 3.4* 3.2* 3.1* 3.6  CL 111 111 113* 111  CO2 21* 22 21* 20*  GLUCOSE 101* 98 81 98  BUN 14 13 11 7   CREATININE 0.79 0.86 0.82 0.80  CALCIUM 8.3* 8.3* 8.0* 7.8*  MG 1.8  --   --  1.9  PHOS 3.1  --   --   --    GFR: Estimated Creatinine Clearance: 82.5 mL/min (by C-G formula based on SCr of 0.8 mg/dL). Liver Function Tests:  Recent Labs Lab 07/05/16 1843  AST 17  ALT 14  ALKPHOS 61  BILITOT 0.8  PROT 6.9  ALBUMIN 3.5   No results for input(s): LIPASE, AMYLASE in the last 168 hours. No results for input(s): AMMONIA in the last 168 hours. Coagulation Profile: No results for input(s): INR, PROTIME in the last 168 hours. Cardiac Enzymes: No results for input(s): CKTOTAL, CKMB, CKMBINDEX, TROPONINI in the last 168 hours. BNP (last 3 results) No results for input(s): PROBNP in the last 8760 hours. HbA1C: No results for input(s): HGBA1C in the last 72 hours. CBG:  Recent Labs Lab 07/07/16 1218 07/07/16 1829 07/08/16 0005 07/08/16 0559 07/08/16 1135  GLUCAP 88 98 101* 108* 109*   Lipid Profile: No results for input(s): CHOL, HDL, LDLCALC, TRIG, CHOLHDL, LDLDIRECT in the last 72 hours. Thyroid Function Tests: No results for input(s): TSH, T4TOTAL, FREET4, T3FREE, THYROIDAB in the last 72 hours. Anemia Panel: No results for input(s): VITAMINB12, FOLATE, FERRITIN, TIBC, IRON, RETICCTPCT in the last 72 hours. Sepsis Labs: No results for input(s): PROCALCITON, LATICACIDVEN in the last 168  hours.  No results found for this or any previous visit (from the past 240 hour(s)).       Radiology Studies: No results found.      Scheduled Meds: . enalaprilat  1.25 mg Intravenous Q6H  . pantoprazole (PROTONIX) IV  40 mg Intravenous Q12H  . polyethylene glycol  17 g Oral TID  . potassium chloride SA  40 mEq Oral BID   Continuous Infusions: . dextrose 5 % and 0.9 % NaCl with KCl 20 mEq/L 75 mL/hr at 07/08/16 0022     LOS: 3 days        [name], MD [facility] Pager [phone]  If 7PM-7AM, please contact night-coverage [email or web address] Password [email or web address] 07/08/2016, 1:44 PM

## 2016-07-09 LAB — BASIC METABOLIC PANEL
Anion gap: 6 (ref 5–15)
CALCIUM: 8 mg/dL — AB (ref 8.9–10.3)
CO2: 23 mmol/L (ref 22–32)
CREATININE: 0.7 mg/dL (ref 0.44–1.00)
Chloride: 110 mmol/L (ref 101–111)
GFR calc Af Amer: >60 mL/min (ref >60)
GLUCOSE: 117 mg/dL — AB (ref 65–99)
Potassium: 4 mmol/L (ref 3.5–5.1)
Sodium: 139 mmol/L (ref 135–145)

## 2016-07-09 LAB — MAGNESIUM: Magnesium: 1.8 mg/dL (ref 1.7–2.4)

## 2016-07-09 MED ORDER — METRONIDAZOLE 500 MG PO TABS
1000.0000 mg | ORAL_TABLET | ORAL | Status: AC
  Administered 2016-07-09 (×3): 1000 mg via ORAL
  Filled 2016-07-09 (×3): qty 2

## 2016-07-09 MED ORDER — CHLORHEXIDINE GLUCONATE 4 % EX LIQD
60.0000 mL | Freq: Once | CUTANEOUS | Status: AC
  Administered 2016-07-09: 4 via TOPICAL
  Filled 2016-07-09 (×2): qty 60

## 2016-07-09 MED ORDER — CHLORHEXIDINE GLUCONATE 4 % EX LIQD
60.0000 mL | Freq: Once | CUTANEOUS | Status: DC
  Filled 2016-07-09: qty 60

## 2016-07-09 MED ORDER — DEXTROSE 5 % IV SOLN
2.0000 g | INTRAVENOUS | Status: AC
  Administered 2016-07-10: 2 g via INTRAVENOUS
  Administered 2016-07-10: 13:00:00 via INTRAVENOUS
  Filled 2016-07-09: qty 2

## 2016-07-09 MED ORDER — NEOMYCIN SULFATE 500 MG PO TABS
1000.0000 mg | ORAL_TABLET | ORAL | Status: AC
  Administered 2016-07-09 (×3): 1000 mg via ORAL
  Filled 2016-07-09 (×3): qty 2

## 2016-07-09 MED ORDER — ALVIMOPAN 12 MG PO CAPS
12.0000 mg | ORAL_CAPSULE | ORAL | Status: AC
  Administered 2016-07-10: 12 mg via ORAL
  Filled 2016-07-09: qty 1

## 2016-07-09 MED ORDER — HEPARIN SODIUM (PORCINE) 5000 UNIT/ML IJ SOLN
5000.0000 [IU] | Freq: Three times a day (TID) | INTRAMUSCULAR | Status: DC
  Administered 2016-07-09 (×3): 5000 [IU] via SUBCUTANEOUS
  Filled 2016-07-09 (×3): qty 1

## 2016-07-09 NOTE — Progress Notes (Signed)
Patient ID: [name], female   DOB: 04-28-1955, 61 y.o.   MRN: [id number]  PROGRESS NOTE    [name]  AJO:[id number] DOB: [birth year] DOA: 07/05/2016 PCP: Patient, No Pcp Per   Brief Narrative:  61 y.o.femalewith medical history significant of osteoarthritis, mild dyspnea on exertion, hyperlipidemia, hypertension, hypothyroidism, morbid obesity, prediabetes, Schamberg disease who presentedfrom [facility] ED after presenting there with 4-6 weeks of recurrent abdominal pain, on and off nausea/vomiting and diarrhea. She was found to have a large bowel obstruction possibly secondary to sigmoid colon mass. General surgery and GI has been called for consult. Patient has an NG tube in place.She had flexible sigmoidoscopy with stent placement on 07/06/2016 by Dr. Benson Norway. She is planned for surgery on 07/10/2016.  Assessment & Plan:   Principal Problem:   SBO (small bowel obstruction) (HCC) Active Problems:   Colonic mass   Hypothyroidism   Hypertension   Pre-diabetes   Hypokalemia   Lower extremities edema  Large bowel obstruction Secondary to colonic stricture status post stent placement. Patient currently having bowel movements. Follow-up from general surgery is appreciated. NG tube has been removed.   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 Probable plan for surgery on 07/10/2016.  Hypothyroidism Resume levothyroxine once tolerating oral intake. TSH monitoring as an outpatient.  Hypertension Blood pressure stable Hold lisinopril while nothing by mouth. Enalapril 1.25 mg IVP every 6 hours. Monitor blood pressure.  Pre-diabetes CBG monitoring every 6 hours.  Lower extremities edema. Echo shows normal LV function with grade 1 diastolic dysfunction  Hypokalemia Improved. Repeat a.m. labs  DVT prophylaxis:SCDs.  Code Status:Full code. Family Communication: none at bedside Disposition Plan:depends on  treatment outcome Consultants:GI and general surgery. Procedure: Sigmoidoscopy on 07/06/2016 which showed sigmoid colon stricture status post stent placement  Echo: On 07/06/2016:Study Conclusions  - Left ventricle: The cavity size was normal. Wall thickness was increased in a pattern of mild LVH. Systolic function was normal. The estimated ejection fraction was in the range of 60% to 65%. Wall motion was normal; there were no regional wall motion abnormalities. Doppler parameters are consistent with abnormal left ventricular relaxation (grade 1 diastolic dysfunction). - Mitral valve: Calcified annulus. Mildly thickened leaflets .  Impressions:  - Normal LV systolic function; mild diastolic dysfunction; mild LVH.  Antimicrobials:None  Subjective: Patient seen and examined at bedside.. She denies any overnight fever, abdominal pain, nausea, vomiting.  Objective: Vitals:   07/08/16 2030 07/09/16 0058 07/09/16 0613 07/09/16 1029  BP: 130/89 133/61 140/82   Pulse: 93 90 85   Resp: 20  20   Temp: 98.3 F (36.8 C)  98 F (36.7 C)   TempSrc: Oral  Oral   SpO2: 98%  97%   Weight:    98.7 kg (217 lb 9.5 oz)  Height:        Intake/Output Summary (Last 24 hours) at 07/09/16 1309 Last data filed at 07/09/16 0300  Gross per 24 hour  Intake           2837.5 ml  Output                0 ml  Net           2837.5 ml   Filed Weights   07/05/16 1832 07/05/16 2131 07/09/16 1029  Weight: 105.2 kg (232 lb) 99.4 kg (219 lb 2.2 oz) 98.7 kg (217 lb 9.5 oz)    Examination:  General exam: Appears calm and comfortable  Respiratory system: Bilateral  decreased breath sound at bases  Cardiovascular system: S1 & S2 heard,Rate controlled  Gastrointestinal system: Abdomen is nondistended, soft and nontender. Normal bowel sounds heard. Extremities: No cyanosis, clubbing, edema     Data Reviewed: I have personally reviewed following labs and imaging  studies  CBC:  Recent Labs Lab 07/05/16 1843 07/06/16 0441 07/07/16 0536 07/08/16 0056  WBC 6.2 5.0 8.7  --   NEUTROABS 4.1 2.4 6.0  --   HGB 12.6 12.1 11.4* 10.9*  HCT 38.9 37.5 34.9* 33.6*  MCV 85.7 86.0 86.2  --   PLT 251 237 208  --    Basic Metabolic Panel:  Recent Labs Lab 07/05/16 1843 07/06/16 0441 07/07/16 0536 07/08/16 0056 07/09/16 0451  NA 141 143 142 139 139  K 3.4* 3.2* 3.1* 3.6 4.0  CL 111 111 113* 111 110  CO2 21* 22 21* 20* 23  GLUCOSE 101* 98 81 98 117*  BUN 14 13 11 7  <5*  CREATININE 0.79 0.86 0.82 0.80 0.70  CALCIUM 8.3* 8.3* 8.0* 7.8* 8.0*  MG 1.8  --   --  1.9 1.8  PHOS 3.1  --   --   --   --    GFR: Estimated Creatinine Clearance: 82.2 mL/min (by C-G formula based on SCr of 0.7 mg/dL). Liver Function Tests:  Recent Labs Lab 07/05/16 1843  AST 17  ALT 14  ALKPHOS 61  BILITOT 0.8  PROT 6.9  ALBUMIN 3.5   No results for input(s): LIPASE, AMYLASE in the last 168 hours. No results for input(s): AMMONIA in the last 168 hours. Coagulation Profile: No results for input(s): INR, PROTIME in the last 168 hours. Cardiac Enzymes: No results for input(s): CKTOTAL, CKMB, CKMBINDEX, TROPONINI in the last 168 hours. BNP (last 3 results) No results for input(s): PROBNP in the last 8760 hours. HbA1C: No results for input(s): HGBA1C in the last 72 hours. CBG:  Recent Labs Lab 07/07/16 1829 07/08/16 0005 07/08/16 0559 07/08/16 1135 07/08/16 1723  GLUCAP 98 101* 108* 109* 139*   Lipid Profile: No results for input(s): CHOL, HDL, LDLCALC, TRIG, CHOLHDL, LDLDIRECT in the last 72 hours. Thyroid Function Tests: No results for input(s): TSH, T4TOTAL, FREET4, T3FREE, THYROIDAB in the last 72 hours. Anemia Panel: No results for input(s): VITAMINB12, FOLATE, FERRITIN, TIBC, IRON, RETICCTPCT in the last 72 hours. Sepsis Labs: No results for input(s): PROCALCITON, LATICACIDVEN in the last 168 hours.  No results found for this or any previous  visit (from the past 240 hour(s)).       Radiology Studies: No results found.      Scheduled Meds: . [START ON 07/10/2016] alvimopan  12 mg Oral On Call to OR  . chlorhexidine  60 mL Topical Once   And  . [START ON 07/10/2016] chlorhexidine  60 mL Topical Once  . enalaprilat  1.25 mg Intravenous Q6H  . heparin subcutaneous  5,000 Units Subcutaneous Q8H  . neomycin  1,000 mg Oral 3 times per day   And  . metroNIDAZOLE  1,000 mg Oral 3 times per day  . pantoprazole (PROTONIX) IV  40 mg Intravenous Q12H  . polyethylene glycol  17 g Oral TID  . potassium chloride SA  40 mEq Oral BID   Continuous Infusions: . [START ON 07/10/2016] cefoTEtan (CEFOTAN) 2 GM IVPB    . dextrose 5 % and 0.9 % NaCl with KCl 20 mEq/L 75 mL/hr at 07/09/16 0539     LOS: 4 days  [name], MD [facility] Pager [phone]  If 7PM-7AM, please contact night-coverage [email or web address] Password [email or web address] 07/09/2016, 1:09 PM

## 2016-07-09 NOTE — Progress Notes (Addendum)
[facility] Office:  [phone] General Surgery Progress Note   LOS: 4 days  POD -  3 Days Post-Op  Chief Complaint: Nausea and vomiting  Assessment and Plan: 1.  Colonic stricture - proximal sigmoid colon  FLEXIBLE SIGMOIDOSCOPY with stent placement - 07/06/2016 - Benson Norway  Consider colectomy this week - hopefully as one stage procedure  On clear liquids and miralax - she says that her stools are "urine" colored  Will set up for colon resection tomorrow - but final decision is Dr. Dalbert Batman - who is our surgeon of the week.  I reviewed with the patient the findings and need for colon surgery.  I discussed both surgical and non surgical options.  I discussed the role of laparoscopic and open surgery in colon surgery.  I reviewed the risks of surgery, including, but not limited to, infection, bleeding, nerve injury, anastomotic leaks, and possibility of colostomy.  Preop orders written - oral antibiotics, alvimopan, NPO past midnight    2.  HTN 3.  DVT prophylaxis - To start SQ heparin 4.  Hypokalemia corrected  K+ - 4.0 - 07/09/2016   Principal Problem:   SBO (small bowel obstruction) (HCC) Active Problems:   Colonic mass   Hypothyroidism   Hypertension   Pre-diabetes   Hypokalemia   Lower extremities edema  Subjective:  Doing well with the clears, no abdominal pain.    Lives near [place] - unmarried - lives with father.  I have talked to sister, on phone.  Objective:   Vitals:   07/09/16 0058 07/09/16 0613  BP: 133/61 140/82  Pulse: 90 85  Resp:  20  Temp:  98 F (36.7 C)     Intake/Output from previous day:  05/27 0701 - 05/28 0700 In: 2837.5 [P.O.:1300; I.V.:1537.5] Out: -   Intake/Output this shift:  No intake/output data recorded.   Physical Exam:   General: Obese WF who is alert and oriented.  Has NGT.   HEENT: Normal. Pupils equal. .   Lungs: Clear   Abdomen: Soft.  No localized tenderness.   Lab Results:     Recent Labs   07/07/16 0536 07/08/16 0056  WBC 8.7  --   HGB 11.4* 10.9*  HCT 34.9* 33.6*  PLT 208  --     BMET    Recent Labs  07/08/16 0056 07/09/16 0451  NA 139 139  K 3.6 4.0  CL 111 110  CO2 20* 23  GLUCOSE 98 117*  BUN 7 <5*  CREATININE 0.80 0.70  CALCIUM 7.8* 8.0*    PT/INR  No results for input(s): LABPROT, INR in the last 72 hours.  ABG  No results for input(s): PHART, HCO3 in the last 72 hours.  Invalid input(s): PCO2, PO2   Studies/Results:  No results found.   Anti-infectives:   Anti-infectives    None      Alphonsa Overall, MD, FACS Pager: [phone] Surgery Office: [phone] 07/09/2016

## 2016-07-10 ENCOUNTER — Inpatient Hospital Stay (HOSPITAL_COMMUNITY): Payer: 59 | Admitting: Certified Registered Nurse Anesthetist

## 2016-07-10 ENCOUNTER — Encounter (HOSPITAL_COMMUNITY): Payer: Self-pay | Admitting: General Practice

## 2016-07-10 ENCOUNTER — Encounter (HOSPITAL_COMMUNITY)
Admission: AD | Disposition: A | Discharge status: Home Health | Payer: Self-pay | Source: Other Acute Inpatient Hospital | Attending: Internal Medicine

## 2016-07-10 DIAGNOSIS — K5792 Diverticulitis of intestine, part unspecified, without perforation or abscess without bleeding: Secondary | ICD-10-CM | POA: Diagnosis present

## 2016-07-10 DIAGNOSIS — K56609 Unspecified intestinal obstruction, unspecified as to partial versus complete obstruction: Secondary | ICD-10-CM | POA: Diagnosis present

## 2016-07-10 DIAGNOSIS — Z9049 Acquired absence of other specified parts of digestive tract: Secondary | ICD-10-CM

## 2016-07-10 HISTORY — PX: BOWEL RESECTION: SHX1257

## 2016-07-10 HISTORY — PX: COLON RESECTION SIGMOID: SHX6737

## 2016-07-10 HISTORY — PX: LAPAROSCOPY: SHX197

## 2016-07-10 LAB — CBC WITH DIFFERENTIAL/PLATELET
BASOS PCT: 0 %
Basophils Absolute: 0 10*3/uL (ref 0.0–0.1)
EOS ABS: 0.5 10*3/uL (ref 0.0–0.7)
Eosinophils Relative: 5 %
HCT: 35.8 % — ABNORMAL LOW (ref 36.0–46.0)
HEMOGLOBIN: 11.6 g/dL — AB (ref 12.0–15.0)
Lymphocytes Relative: 19 %
Lymphs Abs: 2 10*3/uL (ref 0.7–4.0)
MCH: 27.4 pg (ref 26.0–34.0)
MCHC: 32.4 g/dL (ref 30.0–36.0)
MCV: 84.4 fL (ref 78.0–100.0)
MONOS PCT: 8 %
Monocytes Absolute: 0.8 10*3/uL (ref 0.1–1.0)
NEUTROS PCT: 68 %
Neutro Abs: 6.9 10*3/uL (ref 1.7–7.7)
PLATELETS: 271 10*3/uL (ref 150–400)
RBC: 4.24 MIL/uL (ref 3.87–5.11)
RDW: 14.7 % (ref 11.5–15.5)
WBC: 10.2 10*3/uL (ref 4.0–10.5)

## 2016-07-10 LAB — BASIC METABOLIC PANEL
ANION GAP: 6 (ref 5–15)
BUN: <5 mg/dL — ABNORMAL LOW (ref 6–20)
CALCIUM: 8.3 mg/dL — AB (ref 8.9–10.3)
CO2: 24 mmol/L (ref 22–32)
Chloride: 107 mmol/L (ref 101–111)
Creatinine, Ser: 0.75 mg/dL (ref 0.44–1.00)
GFR calc Af Amer: >60 mL/min (ref >60)
GFR calc non Af Amer: >60 mL/min (ref >60)
GLUCOSE: 114 mg/dL — AB (ref 65–99)
POTASSIUM: 4.6 mmol/L (ref 3.5–5.1)
Sodium: 137 mmol/L (ref 135–145)

## 2016-07-10 LAB — CBC
HEMATOCRIT: 33.6 % — AB (ref 36.0–46.0)
HEMOGLOBIN: 10.9 g/dL — AB (ref 12.0–15.0)
MCH: 27.5 pg (ref 26.0–34.0)
MCHC: 32.4 g/dL (ref 30.0–36.0)
MCV: 84.6 fL (ref 78.0–100.0)
Platelets: 289 10*3/uL (ref 150–400)
RBC: 3.97 MIL/uL (ref 3.87–5.11)
RDW: 14.3 % (ref 11.5–15.5)
WBC: 14.1 10*3/uL — ABNORMAL HIGH (ref 4.0–10.5)

## 2016-07-10 LAB — GLUCOSE, CAPILLARY: Glucose-Capillary: 147 mg/dL — ABNORMAL HIGH (ref 65–99)

## 2016-07-10 LAB — POCT I-STAT 4, (NA,K, GLUC, HGB,HCT)
Glucose, Bld: 188 mg/dL — ABNORMAL HIGH (ref 65–99)
HEMATOCRIT: 31 % — AB (ref 36.0–46.0)
HEMOGLOBIN: 10.5 g/dL — AB (ref 12.0–15.0)
Potassium: 4.3 mmol/L (ref 3.5–5.1)
SODIUM: 137 mmol/L (ref 135–145)

## 2016-07-10 LAB — ABO/RH: ABO/RH(D): A POS

## 2016-07-10 LAB — PREPARE RBC (CROSSMATCH)

## 2016-07-10 SURGERY — LAPAROSCOPY OPERATIVE
Anesthesia: General

## 2016-07-10 MED ORDER — FENTANYL CITRATE (PF) 100 MCG/2ML IJ SOLN
INTRAMUSCULAR | Status: AC
  Filled 2016-07-10: qty 2

## 2016-07-10 MED ORDER — SODIUM CHLORIDE 0.9 % IV SOLN: Freq: Once | INTRAVENOUS | Status: DC

## 2016-07-10 MED ORDER — HYDROMORPHONE HCL 2 MG/ML IJ SOLN
INTRAMUSCULAR | Status: AC
  Administered 2016-07-10: 0.5 mg
  Filled 2016-07-10: qty 1

## 2016-07-10 MED ORDER — PROPOFOL 10 MG/ML IV BOLUS
INTRAVENOUS | Status: AC
  Filled 2016-07-10: qty 20

## 2016-07-10 MED ORDER — ALVIMOPAN 12 MG PO CAPS
12.0000 mg | ORAL_CAPSULE | Freq: Two times a day (BID) | ORAL | Status: DC
  Administered 2016-07-11 – 2016-07-12 (×2): 12 mg via ORAL
  Filled 2016-07-10 (×4): qty 1

## 2016-07-10 MED ORDER — PROMETHAZINE HCL 25 MG/ML IJ SOLN
INTRAMUSCULAR | Status: AC
  Administered 2016-07-10: 6.25 mg via INTRAVENOUS
  Filled 2016-07-10: qty 1

## 2016-07-10 MED ORDER — ALBUTEROL SULFATE HFA 108 (90 BASE) MCG/ACT IN AERS
INHALATION_SPRAY | RESPIRATORY_TRACT | Status: DC | PRN
  Administered 2016-07-10: 5 via RESPIRATORY_TRACT

## 2016-07-10 MED ORDER — MIDAZOLAM HCL 2 MG/2ML IJ SOLN
INTRAMUSCULAR | Status: AC
  Filled 2016-07-10: qty 2

## 2016-07-10 MED ORDER — FENTANYL CITRATE (PF) 100 MCG/2ML IJ SOLN
INTRAMUSCULAR | Status: DC | PRN
  Administered 2016-07-10 (×9): 50 ug via INTRAVENOUS

## 2016-07-10 MED ORDER — ROCURONIUM BROMIDE 50 MG/5ML IV SOSY
PREFILLED_SYRINGE | INTRAVENOUS | Status: DC | PRN
  Administered 2016-07-10 (×3): 10 mg via INTRAVENOUS
  Administered 2016-07-10: 20 mg via INTRAVENOUS
  Administered 2016-07-10: 50 mg via INTRAVENOUS
  Administered 2016-07-10: 20 mg via INTRAVENOUS

## 2016-07-10 MED ORDER — LACTATED RINGERS IV SOLN
INTRAVENOUS | Status: DC | PRN
  Administered 2016-07-10: 15:00:00 via INTRAVENOUS

## 2016-07-10 MED ORDER — SUGAMMADEX SODIUM 200 MG/2ML IV SOLN
INTRAVENOUS | Status: DC | PRN
  Administered 2016-07-10: 200 mg via INTRAVENOUS

## 2016-07-10 MED ORDER — HYDROMORPHONE HCL 1 MG/ML IJ SOLN
0.2500 mg | INTRAMUSCULAR | Status: DC | PRN
  Administered 2016-07-10: 0.5 mg via INTRAVENOUS

## 2016-07-10 MED ORDER — ALBUMIN HUMAN 5 % IV SOLN
INTRAVENOUS | Status: DC | PRN
  Administered 2016-07-10: 15:00:00 via INTRAVENOUS

## 2016-07-10 MED ORDER — CEFOTETAN DISODIUM-DEXTROSE 2-2.08 GM-% IV SOLR
INTRAVENOUS | Status: AC
  Filled 2016-07-10: qty 50

## 2016-07-10 MED ORDER — SODIUM CHLORIDE 0.9 % IR SOLN
Status: DC | PRN
  Administered 2016-07-10: 5000 mL

## 2016-07-10 MED ORDER — PROMETHAZINE HCL 25 MG/ML IJ SOLN
6.2500 mg | INTRAMUSCULAR | Status: DC | PRN
Start: 2016-07-10 — End: 2016-07-10
  Administered 2016-07-10: 6.25 mg via INTRAVENOUS

## 2016-07-10 MED ORDER — LACTATED RINGERS IV SOLN
INTRAVENOUS | Status: DC
  Administered 2016-07-10 (×3): via INTRAVENOUS

## 2016-07-10 MED ORDER — CHLORHEXIDINE GLUCONATE CLOTH 2 % EX PADS: 6.0000 | MEDICATED_PAD | Freq: Once | CUTANEOUS | Status: DC

## 2016-07-10 MED ORDER — BUPIVACAINE HCL (PF) 0.5 % IJ SOLN
INTRAMUSCULAR | Status: AC
  Filled 2016-07-10: qty 30

## 2016-07-10 MED ORDER — ONDANSETRON 4 MG PO TBDP: 4.0000 mg | ORAL_TABLET | Freq: Four times a day (QID) | ORAL | Status: DC | PRN

## 2016-07-10 MED ORDER — FENTANYL CITRATE (PF) 250 MCG/5ML IJ SOLN
INTRAMUSCULAR | Status: AC
  Filled 2016-07-10: qty 5

## 2016-07-10 MED ORDER — PIPERACILLIN-TAZOBACTAM 3.375 G IVPB
3.3750 g | Freq: Three times a day (TID) | INTRAVENOUS | Status: DC
  Administered 2016-07-10 – 2016-07-15 (×14): 3.375 g via INTRAVENOUS
  Filled 2016-07-10 (×16): qty 50

## 2016-07-10 MED ORDER — LACTATED RINGERS IV SOLN
INTRAVENOUS | Status: DC
  Administered 2016-07-10 – 2016-07-11 (×3): via INTRAVENOUS

## 2016-07-10 MED ORDER — PROPOFOL 10 MG/ML IV BOLUS
INTRAVENOUS | Status: DC | PRN
  Administered 2016-07-10: 200 mg via INTRAVENOUS

## 2016-07-10 MED ORDER — MIDAZOLAM HCL 5 MG/5ML IJ SOLN
INTRAMUSCULAR | Status: DC | PRN
  Administered 2016-07-10: 2 mg via INTRAVENOUS

## 2016-07-10 MED ORDER — ONDANSETRON HCL 4 MG/2ML IJ SOLN: 4.0000 mg | Freq: Four times a day (QID) | INTRAMUSCULAR | Status: DC | PRN

## 2016-07-10 MED ORDER — PANTOPRAZOLE SODIUM 40 MG IV SOLR
40.0000 mg | Freq: Every day | INTRAVENOUS | Status: DC
  Administered 2016-07-10 – 2016-07-15 (×6): 40 mg via INTRAVENOUS
  Filled 2016-07-10 (×7): qty 40

## 2016-07-10 MED ORDER — CHLORHEXIDINE GLUCONATE CLOTH 2 % EX PADS
6.0000 | MEDICATED_PAD | Freq: Once | CUTANEOUS | Status: AC
  Administered 2016-07-10: 6 via TOPICAL

## 2016-07-10 MED ORDER — HYDROMORPHONE HCL 1 MG/ML IJ SOLN
1.0000 mg | INTRAMUSCULAR | Status: DC | PRN
  Administered 2016-07-10 – 2016-07-12 (×11): 1 mg via INTRAVENOUS
  Administered 2016-07-13: 2 mg via INTRAVENOUS
  Administered 2016-07-13 – 2016-07-14 (×5): 1 mg via INTRAVENOUS
  Filled 2016-07-10 (×4): qty 1
  Filled 2016-07-10: qty 2
  Filled 2016-07-10 (×3): qty 1
  Filled 2016-07-10: qty 2
  Filled 2016-07-10 (×4): qty 1
  Filled 2016-07-10 (×2): qty 2
  Filled 2016-07-10 (×4): qty 1

## 2016-07-10 MED ORDER — PHENYLEPHRINE 40 MCG/ML (10ML) SYRINGE FOR IV PUSH (FOR BLOOD PRESSURE SUPPORT)
PREFILLED_SYRINGE | INTRAVENOUS | Status: DC | PRN
  Administered 2016-07-10: 80 ug via INTRAVENOUS

## 2016-07-10 MED ORDER — LIDOCAINE 2% (20 MG/ML) 5 ML SYRINGE
INTRAMUSCULAR | Status: DC | PRN
  Administered 2016-07-10: 50 mg via INTRAVENOUS

## 2016-07-10 SURGICAL SUPPLY — 67 items
APPLIER CLIP 5 13 M/L LIGAMAX5 (MISCELLANEOUS) ×3
APPLIER CLIP ROT 10 11.4 M/L (STAPLE) ×3
BLADE EXTENDED COATED 6.5IN (ELECTRODE) ×6 IMPLANT
BLADE HEX COATED 2.75 (ELECTRODE) ×3 IMPLANT
BNDG GAUZE ELAST 4 BULKY (GAUZE/BANDAGES/DRESSINGS) ×3 IMPLANT
CELLS DAT CNTRL 66122 CELL SVR (MISCELLANEOUS) ×2 IMPLANT
CLIP APPLIE 5 13 M/L LIGAMAX5 (MISCELLANEOUS) ×2 IMPLANT
CLIP APPLIE ROT 10 11.4 M/L (STAPLE) ×2 IMPLANT
DECANTER SPIKE VIAL GLASS SM (MISCELLANEOUS) ×3 IMPLANT
DEVICE TROCAR PUNCTURE CLOSURE (ENDOMECHANICALS) ×3 IMPLANT
DRAIN CHANNEL 19F RND (DRAIN) ×3 IMPLANT
DRAPE LAPAROSCOPIC ABDOMINAL (DRAPES) ×3 IMPLANT
DRAPE POUCH INSTRU U-SHP 10X18 (DRAPES) ×3 IMPLANT
DRSG PAD ABDOMINAL 8X10 ST (GAUZE/BANDAGES/DRESSINGS) ×3 IMPLANT
ELECT REM PT RETURN 15FT ADLT (MISCELLANEOUS) ×3 IMPLANT
ENSEAL DEVICE STD TIP 35CM (ENDOMECHANICALS) ×3 IMPLANT
GAUZE SPONGE 4X4 12PLY STRL (GAUZE/BANDAGES/DRESSINGS) ×6 IMPLANT
GLOVE BIOGEL PI IND STRL 7.0 (GLOVE) ×2 IMPLANT
GLOVE BIOGEL PI INDICATOR 7.0 (GLOVE) ×1
GLOVE EUDERMIC 7 POWDERFREE (GLOVE) ×3 IMPLANT
GOWN STRL REUS W/TWL LRG LVL3 (GOWN DISPOSABLE) ×3 IMPLANT
GOWN STRL REUS W/TWL XL LVL3 (GOWN DISPOSABLE) ×6 IMPLANT
HANDLE SUCTION POOLE (INSTRUMENTS) ×2 IMPLANT
IRRIG SUCT STRYKERFLOW 2 WTIP (MISCELLANEOUS) ×3
IRRIGATION SUCT STRKRFLW 2 WTP (MISCELLANEOUS) ×2 IMPLANT
KIT COLOSTOMY ILEOSTOMY 4 (WOUND CARE) ×3 IMPLANT
LEGGING LITHOTOMY PAIR STRL (DRAPES) ×3 IMPLANT
LIGASURE IMPACT 36 18CM CVD LR (INSTRUMENTS) ×3 IMPLANT
NS IRRIG 1000ML POUR BTL (IV SOLUTION) ×3 IMPLANT
PACK COLON (CUSTOM PROCEDURE TRAY) ×3 IMPLANT
PAD POSITIONING PINK XL (MISCELLANEOUS) IMPLANT
POSITIONER SURGICAL ARM (MISCELLANEOUS) IMPLANT
RELOAD PROXIMATE 75MM BLUE (ENDOMECHANICALS) ×9 IMPLANT
RTRCTR WOUND ALEXIS 18CM MED (MISCELLANEOUS) ×3
SCISSORS LAP 5X35 DISP (ENDOMECHANICALS) ×3 IMPLANT
SEALER TISSUE X1 CVD JAW (INSTRUMENTS) ×3 IMPLANT
SHEARS HARMONIC ACE PLUS 36CM (ENDOMECHANICALS) ×6 IMPLANT
SOLUTION ANTI FOG 6CC (MISCELLANEOUS) ×3 IMPLANT
SPONGE LAP 18X18 X RAY DECT (DISPOSABLE) ×21 IMPLANT
STAPLER CUT CVD 40MM GREEN (STAPLE) ×3 IMPLANT
STAPLER GUN LINEAR PROX 60 (STAPLE) ×3 IMPLANT
STAPLER PROXIMATE 75MM BLUE (STAPLE) ×3 IMPLANT
STAPLER VISISTAT 35W (STAPLE) ×3 IMPLANT
STRIP CLOSURE SKIN 1/2X4 (GAUZE/BANDAGES/DRESSINGS) ×3 IMPLANT
SUCTION POOLE HANDLE (INSTRUMENTS) ×3
SUT ETHILON 2 0 PS N (SUTURE) ×3 IMPLANT
SUT PDS AB 1 CT1 27 (SUTURE) ×6 IMPLANT
SUT PDS AB 1 TP1 96 (SUTURE) ×12 IMPLANT
SUT PROLENE 0 CT 1 30 (SUTURE) ×3 IMPLANT
SUT SILK 2 0 (SUTURE) ×1
SUT SILK 2 0 SH CR/8 (SUTURE) ×3 IMPLANT
SUT SILK 2 0SH CR/8 30 (SUTURE) ×6 IMPLANT
SUT SILK 2-0 18XBRD TIE 12 (SUTURE) ×2 IMPLANT
SUT SILK 3 0 (SUTURE) ×1
SUT SILK 3 0 SH CR/8 (SUTURE) ×3 IMPLANT
SUT SILK 3-0 18XBRD TIE 12 (SUTURE) ×2 IMPLANT
SUT VIC AB 3-0 SH 18 (SUTURE) ×6 IMPLANT
SUT VICRYL 0 UR6 27IN ABS (SUTURE) ×3 IMPLANT
TAPE CLOTH 4X10 WHT NS (GAUZE/BANDAGES/DRESSINGS) IMPLANT
TOWEL OR 17X26 10 PK STRL BLUE (TOWEL DISPOSABLE) ×6 IMPLANT
TRAY FOLEY W/METER SILVER 16FR (SET/KITS/TRAYS/PACK) ×3 IMPLANT
TROCAR XCEL BLUNT TIP 100MML (ENDOMECHANICALS) ×3 IMPLANT
TROCAR XCEL NON-BLD 11X100MML (ENDOMECHANICALS) ×3 IMPLANT
TROCAR XCEL UNIV SLVE 11M 100M (ENDOMECHANICALS) ×3 IMPLANT
TUBING INSUF HEATED (TUBING) ×3 IMPLANT
WATER STERILE IRR 1500ML POUR (IV SOLUTION) ×3 IMPLANT
YANKAUER SUCT BULB TIP NO VENT (SUCTIONS) ×3 IMPLANT

## 2016-07-10 NOTE — Anesthesia Postprocedure Evaluation (Signed)
Anesthesia Post Note  Patient: ANAHLIA ISEMINGER  Procedure(s) Performed: Procedure(s) (LRB): LAPAROSCOPY converted to open (N/A) sigmoid colon resection with colostomy  SMALL BOWEL RESECTION repair of small bowel x 2  Patient location during evaluation: PACU Anesthesia Type: General Level of consciousness: awake and alert and oriented Pain management: pain level controlled Vital Signs Assessment: post-procedure vital signs reviewed and stable Respiratory status: spontaneous breathing, nonlabored ventilation, respiratory function stable and patient connected to nasal cannula oxygen Cardiovascular status: blood pressure returned to baseline and stable Postop Assessment: no signs of nausea or vomiting Anesthetic complications: no       Last Vitals:  Vitals:   07/10/16 1745 07/10/16 1800  BP: 118/68   Pulse: (!) 106 (!) 108  Resp: 16 15  Temp: 36.8 C     Last Pain:  Vitals:   07/10/16 1745  TempSrc:   PainSc: Asleep                 Sheran Newstrom A.

## 2016-07-10 NOTE — Transfer of Care (Signed)
Immediate Anesthesia Transfer of Care Note  Patient: [name]  Procedure(s) Performed: Procedure(s): LAPAROSCOPIC ASSISTED SIGMOID COLECTOMY (N/A)  Patient Location: PACU  Anesthesia Type:General  Level of Consciousness: awake, alert  and oriented  Airway & Oxygen Therapy: Patient Spontanous Breathing and Patient connected to face mask oxygen  Post-op Assessment: Report given to RN and Post -op Vital signs reviewed and stable  Post vital signs: Reviewed and stable  Last Vitals:  Vitals:   07/09/16 2121 07/10/16 0525  BP: 137/72 (!) 143/91  Pulse: 92 94  Resp: 20 20  Temp: 37.4 C 36.5 C    Last Pain:  Vitals:   07/10/16 1136  TempSrc:   PainSc: 0-No pain      Patients Stated Pain Goal: 2 (24/93/24 1991)  Complications: No apparent anesthesia complications

## 2016-07-10 NOTE — Progress Notes (Signed)
Patient ID: [name], female   DOB: Nov 23, 1955, 61 y.o.   MRN: [id number]  PROGRESS NOTE    MACKIE GOON  QJF:[id number] DOB: [birth year] DOA: 07/05/2016 PCP: Patient, No Pcp Per   Brief Narrative:  61 y.o.femalewith medical history significant of osteoarthritis, mild dyspnea on exertion, hyperlipidemia, hypertension, hypothyroidism, morbid obesity, prediabetes, Schamberg disease who presentedfrom [facility] ED after presenting there with 4-6 weeks of recurrent abdominal pain, on and off nausea/vomiting and diarrhea. She was found to have a large bowel obstruction possibly secondary to sigmoid colon mass. General surgery and GI has been called for consult. Patient has an NG tube in place.She had flexible sigmoidoscopy with stent placement on 07/06/2016 by Dr. Benson Norway. She is planned for surgery today on 07/10/2016.  Assessment & Plan:   Principal Problem:   SBO (small bowel obstruction) (HCC) Active Problems:   Colonic mass   Hypothyroidism   Hypertension   Pre-diabetes   Hypokalemia   Lower extremities edema  Large bowel obstruction Secondary to colonic stricture status post stent placement. Patient currently having bowel movements. Follow-up from general surgery is appreciated. NG tube has been removed.   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 Probable plan for surgery today on 07/10/2016.  Hypothyroidism Resume levothyroxine once tolerating oral intake. TSH monitoring as an outpatient.  Hypertension Blood pressure stable Hold lisinopril while nothing by mouth. Enalapril 1.25 mg IVP every 6 hours. Monitor blood pressure.  Pre-diabetes CBG monitoring every 6 hours.  Lower extremities edema. Echo shows normal LV function with grade 1 diastolic dysfunction  Hypokalemia Improved. Repeat a.m. labs  DVT prophylaxis:SCDs.  Code Status:Full code. Family Communication:none at bedside Disposition Plan:depends  on treatment outcome Consultants:GI and general surgery. Procedure: Sigmoidoscopy on 07/06/2016 which showed sigmoid colon stricture status post stent placement  Echo: On 07/06/2016:Study Conclusions  - Left ventricle: The cavity size was normal. Wall thickness was increased in a pattern of mild LVH. Systolic function was normal. The estimated ejection fraction was in the range of 60% to 65%. Wall motion was normal; there were no regional wall motion abnormalities. Doppler parameters are consistent with abnormal left ventricular relaxation (grade 1 diastolic dysfunction). - Mitral valve: Calcified annulus. Mildly thickened leaflets .  Impressions:  - Normal LV systolic function; mild diastolic dysfunction; mild LVH.  Antimicrobials:None  Subjective: Patient seen and examined at bedside. She denies any overnight fever, nausea, vomiting, abdominal pain.  Objective: Vitals:   07/09/16 1029 07/09/16 1500 07/09/16 2121 07/10/16 0525  BP:  (!) 168/89 137/72 (!) 143/91  Pulse:  92 92 94  Resp:  20 20 20   Temp:  98 F (36.7 C) 99.3 F (37.4 C) 97.7 F (36.5 C)  TempSrc:  Oral Oral Oral  SpO2:  98% 97% 97%  Weight: 98.7 kg (217 lb 9.5 oz)     Height:        Intake/Output Summary (Last 24 hours) at 07/10/16 0949 Last data filed at 07/10/16 0733  Gross per 24 hour  Intake          2240.42 ml  Output                0 ml  Net          2240.42 ml   Filed Weights   07/05/16 1832 07/05/16 2131 07/09/16 1029  Weight: 105.2 kg (232 lb) 99.4 kg (219 lb 2.2 oz) 98.7 kg (217 lb 9.5 oz)    Examination:  General exam: Appears calm and comfortable  Respiratory system: Bilateral decreased breath sound at bases Cardiovascular system: S1 & S2 heard,Rate controlled  Gastrointestinal system: Abdomen is nondistended, soft and nontender. Normal bowel sounds heard. Central nervous system: Alert and oriented. No focal neurological deficits. Moving  extremities Extremities: No cyanosis, clubbing, edema   Data Reviewed: I have personally reviewed following labs and imaging studies  CBC:  Recent Labs Lab 07/05/16 1843 07/06/16 0441 07/07/16 0536 07/08/16 0056 07/10/16 0455  WBC 6.2 5.0 8.7  --  10.2  NEUTROABS 4.1 2.4 6.0  --  6.9  HGB 12.6 12.1 11.4* 10.9* 11.6*  HCT 38.9 37.5 34.9* 33.6* 35.8*  MCV 85.7 86.0 86.2  --  84.4  PLT 251 237 208  --  710   Basic Metabolic Panel:  Recent Labs Lab 07/05/16 1843 07/06/16 0441 07/07/16 0536 07/08/16 0056 07/09/16 0451 07/10/16 0455  NA 141 143 142 139 139 137  K 3.4* 3.2* 3.1* 3.6 4.0 4.6  CL 111 111 113* 111 110 107  CO2 21* 22 21* 20* 23 24  GLUCOSE 101* 98 81 98 117* 114*  BUN 14 13 11 7  <5* <5*  CREATININE 0.79 0.86 0.82 0.80 0.70 0.75  CALCIUM 8.3* 8.3* 8.0* 7.8* 8.0* 8.3*  MG 1.8  --   --  1.9 1.8  --   PHOS 3.1  --   --   --   --   --    GFR: Estimated Creatinine Clearance: 82.2 mL/min (by C-G formula based on SCr of 0.75 mg/dL). Liver Function Tests:  Recent Labs Lab 07/05/16 1843  AST 17  ALT 14  ALKPHOS 61  BILITOT 0.8  PROT 6.9  ALBUMIN 3.5   No results for input(s): LIPASE, AMYLASE in the last 168 hours. No results for input(s): AMMONIA in the last 168 hours. Coagulation Profile: No results for input(s): INR, PROTIME in the last 168 hours. Cardiac Enzymes: No results for input(s): CKTOTAL, CKMB, CKMBINDEX, TROPONINI in the last 168 hours. BNP (last 3 results) No results for input(s): PROBNP in the last 8760 hours. HbA1C: No results for input(s): HGBA1C in the last 72 hours. CBG:  Recent Labs Lab 07/07/16 1829 07/08/16 0005 07/08/16 0559 07/08/16 1135 07/08/16 1723  GLUCAP 98 101* 108* 109* 139*   Lipid Profile: No results for input(s): CHOL, HDL, LDLCALC, TRIG, CHOLHDL, LDLDIRECT in the last 72 hours. Thyroid Function Tests: No results for input(s): TSH, T4TOTAL, FREET4, T3FREE, THYROIDAB in the last 72 hours. Anemia Panel: No  results for input(s): VITAMINB12, FOLATE, FERRITIN, TIBC, IRON, RETICCTPCT in the last 72 hours. Sepsis Labs: No results for input(s): PROCALCITON, LATICACIDVEN in the last 168 hours.  No results found for this or any previous visit (from the past 240 hour(s)).       Radiology Studies: No results found.      Scheduled Meds: . alvimopan  12 mg Oral On Call to OR  . chlorhexidine  60 mL Topical Once  . Chlorhexidine Gluconate Cloth  6 each Topical Once   And  . Chlorhexidine Gluconate Cloth  6 each Topical Once  . enalaprilat  1.25 mg Intravenous Q6H  . pantoprazole (PROTONIX) IV  40 mg Intravenous Q12H  . polyethylene glycol  17 g Oral TID  . potassium chloride SA  40 mEq Oral BID   Continuous Infusions: . cefoTEtan (CEFOTAN) 2 GM IVPB    . dextrose 5 % and 0.9 % NaCl with KCl 20 mEq/L 125 mL/hr at 07/10/16 0646     LOS: 5 days  [name], MD [facility] Pager [phone]  If 7PM-7AM, please contact night-coverage [email or web address] Password [email or web address] 07/10/2016, 9:49 AM

## 2016-07-10 NOTE — Progress Notes (Signed)
4 Days Post-Op  Subjective: Comfortable and alert..  Numerous loose stools.  No nausea or vomiting. She very much wanted to go ahead with surgery to resect the sigmoid colon stricture today Bowel prep has gone about as well as could be expected Potassium 4.6.  Creatinine 0.75.  Glucose 114.  Objective: Vital signs in last 24 hours: Temp:  [97.7 F (36.5 C)-99.3 F (37.4 C)] 97.7 F (36.5 C) (05/29 0525) Pulse Rate:  [85-94] 94 (05/29 0525) Resp:  [20] 20 (05/29 0525) BP: (137-168)/(72-91) 143/91 (05/29 0525) SpO2:  [97 %-98 %] 97 % (05/29 0525) Weight:  [98.7 kg (217 lb 9.5 oz)] 98.7 kg (217 lb 9.5 oz) (05/28 1029) Last BM Date: 07/09/16  Intake/Output from previous day: 05/28 0701 - 05/29 0700 In: 2025 [P.O.:600; I.V.:1425] Out: -  Intake/Output this shift: Total I/O In: 600 [I.V.:600] Out: -    EXAM:  General appearance: Alert.  Cooperative.  Mental status normal.  Excellent insight. Resp: clear to auscultation bilaterally GI: Mild obesity.  Not obviously distended.  Soft.  Nontender.  Lab Results:   Recent Labs  07/08/16 0056  HGB 10.9*  HCT 33.6*   BMET  Recent Labs  07/09/16 0451 07/10/16 0455  NA 139 137  K 4.0 4.6  CL 110 107  CO2 23 24  GLUCOSE 117* 114*  BUN <5* <5*  CREATININE 0.70 0.75  CALCIUM 8.0* 8.3*   PT/INR No results for input(s): LABPROT, INR in the last 72 hours. ABG No results for input(s): PHART, HCO3 in the last 72 hours.  Invalid input(s): PCO2, PO2  Studies/Results: No results found.  Anti-infectives: Anti-infectives    Start     Dose/Rate Route Frequency Ordered Stop   07/10/16 0600  cefoTEtan (CEFOTAN) 2 g in dextrose 5 % 50 mL IVPB     2 g 100 mL/hr over 30 Minutes Intravenous On call to O.R. 07/09/16 1125 07/11/16 0559   07/09/16 1400  neomycin (MYCIFRADIN) tablet 1,000 mg     1,000 mg Oral 3 times per day 07/09/16 1125 07/09/16 2255   07/09/16 1400  metroNIDAZOLE (FLAGYL) tablet 1,000 mg     1,000 mg Oral 3  times per day 07/09/16 1125 07/09/16 2255      Assessment/Plan: s/p Procedure(s): FLEXIBLE SIGMOIDOSCOPY  Sigmoid colonic stricture with obstruction.  Obstructive symptoms improved following stent placement.   Endoscopic appearance benign  Has tolerated bowel prep  We'll try to proceed with laparoscopic-assisted sigmoid colectomy this afternoon if schedule allows  I discussed the indications, details, techniques, and numerous risk of the surgery with her.  She's aware of risk of bleeding, infection, temporary or permanent colostomy, injury to adjacent organs with major reconstructive  surgery, wound problems such as hernia or dehiscence, anastomotic leak requiring reoperation and colostomy and sepsis, and other unforeseen problems.  She understands these issues well.  All of her questions are answered.  She agrees with this plan.    HTN DVT prophylaxis - hold [place] heparin this morning  Obesity  Lower extremity edema.  Echo with normal LV function   LOS: 5 days    Cale Decarolis M 07/10/2016

## 2016-07-10 NOTE — Anesthesia Preprocedure Evaluation (Signed)
Anesthesia Evaluation  Patient identified by MRN, date of birth, ID band Patient awake    Reviewed: Allergy & Precautions, NPO status , Patient's Chart, lab work & pertinent test results  Airway Mallampati: II  TM Distance: >3 FB Neck ROM: Full    Dental no notable dental hx.    Pulmonary neg pulmonary ROS,    Pulmonary exam normal breath sounds clear to auscultation       Cardiovascular hypertension, + DOE  Normal cardiovascular exam Rhythm:Regular Rate:Normal     Neuro/Psych negative neurological ROS  negative psych ROS   GI/Hepatic negative GI ROS, Neg liver ROS,   Endo/Other  Hypothyroidism   Renal/GU negative Renal ROS  negative genitourinary   Musculoskeletal negative musculoskeletal ROS (+)   Abdominal   Peds negative pediatric ROS (+)  Hematology  (+) anemia ,   Anesthesia Other Findings   Reproductive/Obstetrics negative OB ROS                             Anesthesia Physical Anesthesia Plan  ASA: III  Anesthesia Plan: General   Post-op Pain Management:    Induction: Intravenous  Airway Management Planned: Oral ETT  Additional Equipment:   Intra-op Plan:   Post-operative Plan: Extubation in OR  Informed Consent: I have reviewed the patients History and Physical, chart, labs and discussed the procedure including the risks, benefits and alternatives for the proposed anesthesia with the patient or authorized representative who has indicated his/her understanding and acceptance.   Dental advisory given  Plan Discussed with: CRNA and Surgeon  Anesthesia Plan Comments:         Anesthesia Quick Evaluation

## 2016-07-10 NOTE — Op Note (Signed)
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 Dalbert Batman, M.D., FACS  Assistant:                      Gurney Maxin, M.D.   Indication for Assistant: Complex exposure, difficult retroperitoneal dissection, surgeon and assistant indicated to reduce incidence of intraoperative and postoperative competitions  Operative Indications:   This is a pleasant 61 year old female who has a two-year history of intermittent episodes of abdominal pain and distention.  It sounds like she's been having repeated bouts of diverticulitis but has not presented for evaluation.  CT scan on 07/05/2016 shows high-grade large bowel obstruction due to a sigmoid colon mass but no abscess.  She was transferred from [facility] here.  Nasogastric tube was placed.  Colonoscopy was performed which identified what appeared to be a benign stricture in the mid sigmoid colon.  A stent was placed.  This was successful at opening her up and she began having stools.  She underwent modified bowel prep.  She is brought to the operating room for colon resection, hopeful that we can perform a primary resection  Operative Findings:       The sigmoid colon was extensively inflamed, both chronically and acutely.  The gross appearance was much more typical of an inflammatory process such as diverticulitis.  Cancer was felt to be less likely.  There are multiple loops of small bowel  densely adherent to the sigmoid colon.  Retroperitoneal dissection was very difficult.  Almost rock hard and woody in nature, suggesting multiple prior inflammatory events.  Primary anastomosis was essentially contraindicated due to the severe infection and partial obstruction and contamination.  Procedure in Detail:          Following the induction of general endotracheal anesthesia and oral gastric tube was placed.  Foley catheter was placed.  The patient was positioned in modified lithotomy position in padded stirrups.  The abdomen and perineum  were prepped and draped in a sterile fashion.  Surgical timeout was performed.  Intravenous as well as were given.      An 11 mm Hassan trocar was placed at the upper umbilical rim with an open technique.  The Hassan trocar was held in place with the Purstring suture of 0 Vicryl.  Pneumoperitoneum was created.  Video camera was inserted.  I placed a 5 mm trocar in the lower midline and a  5 mm trocar in the left mid abdomen.  We tried to move the small bowel out of the way and found it was adherent in the pelvis and to the left colon.  We tried to move the omentum out of the way with we were only partially able to do that.  It became quite apparent that there was a long segment segment of sigmoid colon that was almost rockhard.  We abandoned the laparoscopic approach at that point.     The trochars were removed.  A midline laparotomy incision was made both above and below the umbilicus.  The fascia was incised in the midline.  Self-retaining retractors were placed.  The abdomen was explored with findings as described above.  We mobilized the splenic flexure  bluntly and sharply using the LigaSure device.  Mobilized the descending colon from lateral to medial.  We very slowly mobilized the inflamed sigmoid colon from lateral to medial using a lot of blunt dissection trying to stay out of the retroperitoneum.  We kept the dissection immediately adjacent to the colon to  avoid injury to the ureter.  We used some sharp dissection conservatively.      We spent a long time mobilizing loops of small bowel out of the pelvis and at least 3 loops of small bowel were densely adherent to the sigmoid colon.  2 of these areas had serosal injuries which we repaired with multiple interrupted 3-0 silk sutures placed in a transverse fashion.  Once segment about 4 inches long was too badly damaged and may have been associated with a fistula to the colon.  This area was resected with a using a GIA stapling device proximal and distal.  Mesentery was controlled with LigaSure device.  Primary small bowel anastomosis was created using a GIA stapling device and the common defect was closed with a TA 60 device.  Further silk sutures were placed to close the mesentery and to reinforce the staple line at critical points.  Ran the small bowel 2 more times and found no other injuries and we could identify the ligament of Treitz and all the way down to the ileocecal valve.     We could feel the transition between the inflamed sigmoid colon and a much softer colon at the junction of the descending and sigmoid colon.  We transected the left colon at the most proximal aspect of the sigmoid colon using a GIA stapling device.  We then continued to resect the sigmoid colon down to the proximal rectum.  There was still thickened at this point.  We opened this up and saw that some of the stent was going distally and so we pulled the stent out of the distal rectum and then amputated the diseased distal sigmoid colon and sent that to the lab.  The rectal stump was further skeletonized and then stapled with a contour stapling device.  There was some rectal stump that was resected and that was also sent to the lab.  The rectal stump was marked with 2-0 Prolene  sutures for future reference.  Superior hemorrhoidal arterial bleeders were controlled with suture ligatures of 2-0 silk.     At this point we had lost about 500  mL of blood but hemostasis was good.  We irrigated out the abdomen and pelvis.  There was no bleeding from the spleen.  There was minimal bleeding in the pelvis which was well controlled.  Small bowel and right colon and descending colon looked good.  I placed a 70 [place] Blake drain down into the pelvis and brought that out through one of the trocar sites in the right lower quadrant and connected that to suction bulb incision to the skin.     The colostomy was brought out in the left mid abdomen just above the level of the umbilicus.  I excised a circular button of skin.  Rectus fascia was incised in a cruciate fashion.  I dilated the colostomy site until it would easily accept 2 fingers.  I then passed the descending colon through the colostomy site being careful not to twist this.  This was pink and viable.  The drain was placed in the pelvis.  Small bowel and omentum were returned to their anatomic positions.  Midline fascia was closed with a running suture of #1, double-stranded PDS in the midline wound packed open with Kerlix and saline packing.     I have indicated the staple line on the colostomy in and then matured the colostomy with about 10 or 12 interrupted sutures of 3-0 Vicryl.  This was pink and healthy.  I was able to pass my finger down into the colon and below the fascia and there was no obstruction.  Colostomy bag was placed.      The patient tolerated the procedure well was taken to PACU in stable condition.  EBL 600 mL, counts correct.  Complications none.  At the completion of the case the patient had excellent urine output and the urine was clear     Edsel Petrin. Dalbert Batman, M.D., FACS General and Minimally Invasive Surgery Breast and Colorectal Surgery  07/10/2016 4:34 PM

## 2016-07-10 NOTE — Progress Notes (Signed)
Pharmacy Antibiotic Note  [name] is a 61 y.o. female admitted on 07/05/2016 with severe diverticulitis s/p sigmoid colectomy.  Pharmacy has been consulted for Zosyn dosing.  Plan: Zosyn 3.375gm IV q8h (4hr extended infusions) F/U micro and renal function. Adjust doses as needed.   Height: 5\' 4"  (162.6 cm) Weight: 217 lb 9.5 oz (98.7 kg) IBW/kg (Calculated) : 54.7  Temp (24hrs), Avg:98.1 F (36.7 C), Min:97.3 F (36.3 C), Max:99.3 F (37.4 C)   Recent Labs Lab 07/05/16 1843 07/06/16 0441 07/07/16 0536 07/08/16 0056 07/09/16 0451 07/10/16 0455 07/10/16 1729  WBC 6.2 5.0 8.7  --   --  10.2 14.1*  CREATININE 0.79 0.86 0.82 0.80 0.70 0.75  --     Estimated Creatinine Clearance: 82.2 mL/min (by C-G formula based on SCr of 0.75 mg/dL).    No Known Allergies  Antimicrobials this admission: 5/29 Cefotan pre-op 5/29 Zosyn >>  Dose adjustments this admission: ---  Microbiology results: None  Thank you for allowing pharmacy to be a part of this patient's care.  Peggyann Juba, PharmD, BCPS Pager: [phone] 07/10/2016 6:28 PM

## 2016-07-10 NOTE — Anesthesia Procedure Notes (Signed)
Procedure Name: Intubation Date/Time: 07/10/2016 1:02 PM Performed by: Deliah Boston Pre-anesthesia Checklist: Patient identified, Emergency Drugs available, Suction available and Patient being monitored Patient Re-evaluated:Patient Re-evaluated prior to inductionOxygen Delivery Method: Circle system utilized Preoxygenation: Pre-oxygenation with 100% oxygen Intubation Type: IV induction Ventilation: Mask ventilation without difficulty Laryngoscope Size: Mac and 3 Grade View: Grade III Tube type: Oral Number of attempts: 1 Airway Equipment and Method: Stylet and Oral airway Placement Confirmation: ETT inserted through vocal cords under direct vision,  positive ETCO2 and breath sounds checked- equal and bilateral Secured at: 22 cm Tube secured with: Tape Dental Injury: Teeth and Oropharynx as per pre-operative assessment and Injury to lip  Comments: Small nick to left lower lip

## 2016-07-11 ENCOUNTER — Encounter (HOSPITAL_COMMUNITY): Payer: Self-pay | Admitting: General Surgery

## 2016-07-11 DIAGNOSIS — Z9049 Acquired absence of other specified parts of digestive tract: Secondary | ICD-10-CM

## 2016-07-11 LAB — TYPE AND SCREEN
ABO/RH(D): A POS
Antibody Screen: NEGATIVE
UNIT DIVISION: 0
Unit division: 0

## 2016-07-11 LAB — BPAM RBC
BLOOD PRODUCT EXPIRATION DATE: 201806072359
Blood Product Expiration Date: 201806072359
Unit Type and Rh: 6200
Unit Type and Rh: 6200

## 2016-07-11 LAB — BASIC METABOLIC PANEL
Anion gap: 5 (ref 5–15)
Anion gap: 6 (ref 5–15)
BUN: 5 mg/dL — ABNORMAL LOW (ref 6–20)
BUN: 7 mg/dL (ref 6–20)
CHLORIDE: 108 mmol/L (ref 101–111)
CO2: 24 mmol/L (ref 22–32)
CO2: 23 mmol/L (ref 22–32)
CREATININE: 1.04 mg/dL — AB (ref 0.44–1.00)
Calcium: 7.4 mg/dL — ABNORMAL LOW (ref 8.9–10.3)
Calcium: 7.2 mg/dL — ABNORMAL LOW (ref 8.9–10.3)
Chloride: 108 mmol/L (ref 101–111)
Creatinine, Ser: 0.99 mg/dL (ref 0.44–1.00)
GFR calc Af Amer: >60 mL/min (ref >60)
GFR calc non Af Amer: 59 mL/min — ABNORMAL LOW (ref >60)
GFR, EST NON AFRICAN AMERICAN: 56 mL/min — AB (ref >60)
Glucose, Bld: 122 mg/dL — ABNORMAL HIGH (ref 65–99)
Glucose, Bld: 113 mg/dL — ABNORMAL HIGH (ref 65–99)
POTASSIUM: 5.3 mmol/L — AB (ref 3.5–5.1)
Potassium: 4.2 mmol/L (ref 3.5–5.1)
SODIUM: 137 mmol/L (ref 135–145)
SODIUM: 137 mmol/L (ref 135–145)

## 2016-07-11 LAB — CBC
HCT: 28.6 % — ABNORMAL LOW (ref 36.0–46.0)
HEMATOCRIT: 29.7 % — AB (ref 36.0–46.0)
HEMOGLOBIN: 9.8 g/dL — AB (ref 12.0–15.0)
Hemoglobin: 9.5 g/dL — ABNORMAL LOW (ref 12.0–15.0)
MCH: 27.9 pg (ref 26.0–34.0)
MCH: 28.1 pg (ref 26.0–34.0)
MCHC: 33 g/dL (ref 30.0–36.0)
MCHC: 33.2 g/dL (ref 30.0–36.0)
MCV: 84.6 fL (ref 78.0–100.0)
MCV: 84.6 fL (ref 78.0–100.0)
PLATELETS: 224 10*3/uL (ref 150–400)
Platelets: 172 10*3/uL (ref 150–400)
RBC: 3.51 MIL/uL — ABNORMAL LOW (ref 3.87–5.11)
RBC: 3.38 MIL/uL — ABNORMAL LOW (ref 3.87–5.11)
RDW: 14.5 % (ref 11.5–15.5)
RDW: 14.6 % (ref 11.5–15.5)
WBC: 15.3 10*3/uL — AB (ref 4.0–10.5)
WBC: 15.5 10*3/uL — ABNORMAL HIGH (ref 4.0–10.5)

## 2016-07-11 LAB — MAGNESIUM: Magnesium: 1.3 mg/dL — ABNORMAL LOW (ref 1.7–2.4)

## 2016-07-11 LAB — LACTIC ACID, PLASMA: LACTIC ACID, VENOUS: 1.3 mmol/L (ref 0.5–1.9)

## 2016-07-11 MED ORDER — ENOXAPARIN SODIUM 40 MG/0.4ML ~~LOC~~ SOLN
40.0000 mg | SUBCUTANEOUS | Status: DC
  Administered 2016-07-11 – 2016-07-17 (×7): 40 mg via SUBCUTANEOUS
  Filled 2016-07-11 (×7): qty 0.4

## 2016-07-11 MED ORDER — SODIUM CHLORIDE 0.9 % IV BOLUS (SEPSIS)
1000.0000 mL | Freq: Once | INTRAVENOUS | Status: AC
  Administered 2016-07-11: 1000 mL via INTRAVENOUS

## 2016-07-11 MED ORDER — SODIUM CHLORIDE 0.9 % IV SOLN
INTRAVENOUS | Status: DC
  Administered 2016-07-11 – 2016-07-12 (×3): via INTRAVENOUS

## 2016-07-11 MED ORDER — MAGNESIUM SULFATE 2 GM/50ML IV SOLN
2.0000 g | Freq: Once | INTRAVENOUS | Status: AC
  Administered 2016-07-11: 2 g via INTRAVENOUS
  Filled 2016-07-11: qty 50

## 2016-07-11 MED ORDER — SODIUM CHLORIDE 0.9 % IV BOLUS (SEPSIS)
500.0000 mL | Freq: Once | INTRAVENOUS | Status: AC
  Administered 2016-07-11: 500 mL via INTRAVENOUS

## 2016-07-11 MED ORDER — CHLORHEXIDINE GLUCONATE 0.12 % MT SOLN
15.0000 mL | Freq: Two times a day (BID) | OROMUCOSAL | Status: DC
  Administered 2016-07-11 – 2016-07-13 (×5): 15 mL via OROMUCOSAL
  Filled 2016-07-11 (×4): qty 15

## 2016-07-11 MED ORDER — LEVOTHYROXINE SODIUM 100 MCG IV SOLR
50.0000 ug | Freq: Every day | INTRAVENOUS | Status: DC
  Administered 2016-07-12: 50 ug via INTRAVENOUS
  Filled 2016-07-11: qty 5

## 2016-07-11 MED ORDER — SODIUM CHLORIDE 0.9 % IV SOLN: INTRAVENOUS | Status: DC

## 2016-07-11 MED ORDER — ORAL CARE MOUTH RINSE
15.0000 mL | Freq: Two times a day (BID) | OROMUCOSAL | Status: DC
  Administered 2016-07-11 – 2016-07-13 (×5): 15 mL via OROMUCOSAL

## 2016-07-11 NOTE — Progress Notes (Addendum)
Patient ID: [name], female   DOB: 1956-01-12, 61 y.o.   MRN: [id number]  PROGRESS NOTE    [name]  WSF:[id number] DOB: 29-Jun-1955 DOA: 07/05/2016 PCP: Patient, No Pcp Per   Brief Narrative:  61 y.o.femalewith medical history significant of osteoarthritis, mild dyspnea on exertion, hyperlipidemia, hypertension, hypothyroidism, morbid obesity, prediabetes, Schamberg disease who [facility] ED after presenting there with 4-6 weeks of recurrent abdominal pain, on and off nausea/vomiting and diarrhea. She was found to have a large bowel obstruction possibly secondary to sigmoid colon mass. General surgery and GI has been called for consult. Patient has an NG tube in place.She had flexible sigmoidoscopy with stent placement on 07/06/2016 by Dr. Benson Norway. She is planned for surgery today on 07/10/2016.  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  Hypertension --Hold lisinopril while nothing by mouth. --bp borderline low, likely due to dehydration, on ivf and fluids bolus given  Pre-diabetes --CBG monitoring every 6 hours.  Chronic bilateral Lower extremities edema. --Echo shows normal LV function  with grade 1 diastolic dysfunction --Currently no edema, monitor volume status   DVT prophylaxis:SCDs.  Code Status:Full code. Family Communication:none at bedside Disposition Plan:remain in stepdown Consultants:GI and general surgery  Procedure:  Sigmoidoscopy on 07/06/2016 which showed sigmoid colon stricture status post stent placement sigmoid colectomy with colostomy, SBR, takedown splenic flexure on 5/29  Echo: On 07/06/2016:Study Conclusions  - Left ventricle: The cavity size was normal. Wall thickness was increased in a pattern of mild LVH. Systolic function was normal. The estimated ejection fraction was in the range of 60% to 65%. Wall motion was normal; there were no regional wall motion abnormalities. Doppler parameters are consistent with abnormal left ventricular relaxation (grade 1 diastolic dysfunction). - Mitral valve: Calcified annulus. Mildly thickened leaflets .  Impressions:  - Normal LV systolic function; mild diastolic dysfunction; mild LVH.  Antimicrobials:zosyn  Subjective: Sitting up in chair, denies pain, no n/v, colostomy with liquid output, drain with serosanguineous fluids  Objective: Vitals:   07/11/16 0600 07/11/16 0630 07/11/16 0700 07/11/16 0730  BP: (!) 91/37 (!) 110/57 (!) 97/36 (!) 118/48  Pulse: 93 (!) 102 (!) 102 (!) 104  Resp: 14 (!) 23 16 (!) 21  Temp:      TempSrc:      SpO2: 92% 94% 93% (!) 87%  Weight:      Height:        Intake/Output Summary (Last 24 hours) at 07/11/16 0744 Last data filed at 07/11/16 0700  Gross per 24 hour  Intake          8890.42 ml  Output             2560 ml  Net          6330.42 ml   Filed Weights   07/09/16 1029 07/10/16  1136 07/11/16 0525  Weight: 98.7 kg (217 lb 9.5 oz) 98.7 kg (217 lb 9.5 oz) 103.2 kg (227 lb 8.2 oz)    Examination:  General exam: Appears calm and comfortable  Respiratory system: Bilateral decreased breath sound at bases Cardiovascular  system: S1 & S2 heard,Rate controlled  Gastrointestinal system: post op changes, +colostomy, +drain. Normal bowel sounds heard. Central nervous system: Alert and oriented. No focal neurological deficits. Moving extremities Extremities: chronic venous staNo cyanosis, clubbing, edema   Data Reviewed: I have personally reviewed following labs and imaging studies  CBC:  Recent Labs Lab 07/05/16 1843 07/06/16 0441 07/07/16 0536 07/08/16 0056 07/10/16 0455 07/10/16 1729 07/10/16 1733 07/11/16 0327  WBC 6.2 5.0 8.7  --  10.2 14.1*  --  15.3*  NEUTROABS 4.1 2.4 6.0  --  6.9  --   --   --   HGB 12.6 12.1 11.4* 10.9* 11.6* 10.9* 10.5* 9.8*  HCT 38.9 37.5 34.9* 33.6* 35.8* 33.6* 31.0* 29.7*  MCV 85.7 86.0 86.2  --  84.4 84.6  --  84.6  PLT 251 237 208  --  271 289  --  034   Basic Metabolic Panel:  Recent Labs Lab 07/05/16 1843  07/07/16 0536 07/08/16 0056 07/09/16 0451 07/10/16 0455 07/10/16 1733 07/11/16 0327  NA 141  < > 142 139 139 137 137 137  K 3.4*  < > 3.1* 3.6 4.0 4.6 4.3 5.3*  CL 111  < > 113* 111 110 107  --  108  CO2 21*  < > 21* 20* 23 24  --  24  GLUCOSE 101*  < > 81 98 117* 114* 188* 122*  BUN 14  < > 11 7 <5* <5*  --  5*  CREATININE 0.79  < > 0.82 0.80 0.70 0.75  --  0.99  CALCIUM 8.3*  < > 8.0* 7.8* 8.0* 8.3*  --  7.4*  MG 1.8  --   --  1.9 1.8  --   --  1.3*  PHOS 3.1  --   --   --   --   --   --   --   < > = values in this interval not displayed. GFR: Estimated Creatinine Clearance: 68 mL/min (by C-G formula based on SCr of 0.99 mg/dL). Liver Function Tests:  Recent Labs Lab 07/05/16 1843  AST 17  ALT 14  ALKPHOS 61  BILITOT 0.8  PROT 6.9  ALBUMIN 3.5   No results for input(s): LIPASE, AMYLASE in the last 168 hours. No results for input(s): AMMONIA in the last 168 hours. Coagulation Profile: No results for input(s): INR, PROTIME in the last 168 hours. Cardiac Enzymes: No results for input(s): CKTOTAL, CKMB, CKMBINDEX, TROPONINI in the last  168 hours. BNP (last 3 results) No results for input(s): PROBNP in the last 8760 hours. HbA1C: No results for input(s): HGBA1C in the last 72 hours. CBG:  Recent Labs Lab 07/08/16 0005 07/08/16 0559 07/08/16 1135 07/08/16 1723 07/10/16 1647  GLUCAP 101* 108* 109* 139* 147*   Lipid Profile: No results for input(s): CHOL, HDL, LDLCALC, TRIG, CHOLHDL, LDLDIRECT in the last 72 hours. Thyroid Function Tests: No results for input(s): TSH, T4TOTAL, FREET4, T3FREE, THYROIDAB in the last 72 hours. Anemia Panel: No results for input(s): VITAMINB12, FOLATE, FERRITIN, TIBC, IRON, RETICCTPCT in the last 72 hours. Sepsis Labs: No results for input(s): PROCALCITON, LATICACIDVEN in the last 168 hours.  No results found for this or any previous visit (from the past 240  hour(s)).       Radiology Studies: No results found.      Scheduled Meds: . alvimopan  12 mg Oral BID  . chlorhexidine  60 mL Topical Once  . Chlorhexidine Gluconate Cloth  6 each Topical Once  . enalaprilat  1.25 mg Intravenous Q6H  . enoxaparin (LOVENOX) injection  40 mg Subcutaneous Q24H  . pantoprazole (PROTONIX) IV  40 mg Intravenous QHS   Continuous Infusions: . sodium chloride    . sodium chloride    . sodium chloride 125 mL/hr at 07/11/16 0700  . magnesium sulfate 1 - 4 g bolus IVPB    . piperacillin-tazobactam (ZOSYN)  IV 3.375 g (07/11/16 0500)  . sodium chloride 1,000 mL (07/11/16 0716)     LOS: 6 days      Time spent> 26mins  Sherryll Skoczylas, MD PhD [facility] Pager [phone]  If 7PM-7AM, please contact night-coverage [email or web address] Password [facility] 07/11/2016, 7:44 AM

## 2016-07-11 NOTE — Progress Notes (Signed)
Date: Jul 11, 2016 Information for hhRN faxed to [facility]. Velva Harman, BSN, [place], CCM: [phone]

## 2016-07-11 NOTE — Progress Notes (Signed)
Nutrition Follow-up  DOCUMENTATION CODES:   Obesity unspecified  INTERVENTION:  - Diet advancement as medically feasible. - RD will continue to monitor for plan and provide interventions as warranted.  NUTRITION DIAGNOSIS:   Inadequate oral intake related to altered GI function as evidenced by NPO status. -ongoing  GOAL:   Patient will meet greater than or equal to 90% of their needs -unable to meet.     MONITOR:   Diet advancement, Labs, Weight trends  ASSESSMENT:    61 y.o. female with medical history significant of osteoarthritis, mild dyspnea on exertion, hyperlipidemia, hypertension, hypothyroidism, morbid obesity, prediabetes, Schamberg disease who is coming from [facility] ED after presenting there with 4-6 weeks of recurrent abdominal pain, on and off nausea/vomiting and diarrhea. CT scan there demonstrated a large bowel obstruction reportedly due to a sigmoid colon mass.   5/30 Pt on CLD 5/27 at 0815 until 0001 on 5/29. She consumed 100% of breakfast and lunch on 5/28. She is now POD #1 sigmoid colectomy and colostomy (lap converted to open), small bowel resection with repair of small bowel x2, takedown of splenic flexure. Estimated kcal need updated based on this recent procedure. Weight +8 lbs since admission so estimated needs remain based on admission weight (99.4 kg). Slow sheet indicates small amount of brown liquid stool via colostomy today at 0800.   Nutrition-focused physical assessment shows no muscle and no fat wasting at this time but will continue to monitor for indications of malnutrition.   Medications reviewed; 50 mcg IV Synthroid/day, 2 g IV Mg sulfate x1 run today, 40 mg IV Protonix/day. Labs reviewed; creatinine: 1.04 mg/dL, Ca: 7.2 mg/dL, GFR: 56 mL/min.  IVF: NS @ 125 mL/hr.    5/25 - RD unable to speak with pt today as pt was in procedure at time of visit.  - Pt currently NPO; awaiting GI evaluation.  - Patient might need sigmoidoscopy/colonoscopy.   - Pt with NGT in place.  - Pt with hypokalemia; monitor and supplement as needed per MD discretion.  - Unable to complete Nutrition-Focused physical exam at this time.    Diet Order:  Diet NPO time specified Except for: Ice Chips, Sips with Meds  Skin:  Reviewed, no issues  Last BM:  5/30  Height:   Ht Readings from Last 1 Encounters:  07/10/16 5\' 4"  (1.626 m)    Weight:   Wt Readings from Last 1 Encounters:  07/11/16 227 lb 8.2 oz (103.2 kg)    Ideal Body Weight:  54.5 kg  BMI:  Body mass index is 39.05 kg/m.  Estimated Nutritional Needs:   Kcal:  1790-1990 (18-20 kcal/kg)  Protein:  100-110g/day   Fluid:  >1.5L/day   EDUCATION NEEDS:   Education needs no appropriate at this time    Jarome Matin, MS, RD, LDN, CNSC Inpatient Clinical Dietitian Pager # [phone] After hours/weekend pager # [phone]

## 2016-07-11 NOTE — Progress Notes (Signed)
1 Day Post-Op  Subjective: Stable and alert I explained operative findings and procedures to her No evidence of active bleeding, but BP has been a little soft and she received some fluid boluses during the night. Drainage serosanguineous Has had some stool out of ostomy.  Hemoglobin 9.8.  WBC 15,300.  Potassium 5.3.  Creatinine 0.99.  Glucose 122. Objective: Vital signs in last 24 hours: Temp:  [97.3 F (36.3 C)-98.2 F (36.8 C)] 97.6 F (36.4 C) (05/30 0325) Pulse Rate:  [93-110] 102 (05/30 0630) Resp:  [12-23] 23 (05/30 0630) BP: (85-138)/(37-88) 110/57 (05/30 0630) SpO2:  [91 %-100 %] 94 % (05/30 0630) Weight:  [98.7 kg (217 lb 9.5 oz)-103.2 kg (227 lb 8.2 oz)] 103.2 kg (227 lb 8.2 oz) (05/30 0525) Last BM Date: 07/05/16  Intake/Output from previous day: 05/29 0701 - 05/30 0700 In: 9295.8 [I.V.:7365.8; IV Piggyback:1850] Out: 2460 [CXKGY:1856; Drains:290; Stool:500; Blood:600] Intake/Output this shift: Total I/O In: 4159.2 [I.V.:2479.2; Other:80; IV Piggyback:1600] Out: 760 [Urine:270; Drains:140; Stool:350]   EXAM General appearance: Alert.  Mental status normal.  Mild to moderate distress from incisional pain.  Appropriate. Resp: clear to auscultation bilaterally GI: Soft.  Appropriately tender.  Midline wound intact.  Packed open without bleeding.  Ostomy pink with small amount of stool in bag.  JP drainage serosanguineous. Extremities: No edema.  Nontender.  Lab Results:  Results for orders placed or performed during the hospital encounter of 07/05/16 (from the past 24 hour(s))  Type and screen [place]     Status: None (Preliminary result)   Collection Time: 07/10/16  2:20 PM  Result Value Ref Range   ABO/RH(D) A POS    Antibody Screen NEG    Sample Expiration 07/13/2016    Unit Number [id number]    Blood Component Type RED CELLS,LR    Unit division 00    Status of Unit ALLOCATED    Transfusion Status OK TO TRANSFUSE    Crossmatch  Result Compatible    Unit Number [id number]    Blood Component Type RED CELLS,LR    Unit division 00    Status of Unit ALLOCATED    Transfusion Status OK TO TRANSFUSE    Crossmatch Result Compatible   Prepare RBC     Status: None   Collection Time: 07/10/16  2:20 PM  Result Value Ref Range   Order Confirmation ORDER PROCESSED BY BLOOD BANK   ABO/Rh     Status: None   Collection Time: 07/10/16  2:20 PM  Result Value Ref Range   ABO/RH(D) A POS   Glucose, capillary     Status: Abnormal   Collection Time: 07/10/16  4:47 PM  Result Value Ref Range   Glucose-Capillary 147 (H) 65 - 99 mg/dL   Comment 1 Notify RN    Comment 2 Document in Chart   CBC     Status: Abnormal   Collection Time: 07/10/16  5:29 PM  Result Value Ref Range   WBC 14.1 (H) 4.0 - 10.5 K/uL   RBC 3.97 3.87 - 5.11 MIL/uL   Hemoglobin 10.9 (L) 12.0 - 15.0 g/dL   HCT 33.6 (L) 36.0 - 46.0 %   MCV 84.6 78.0 - 100.0 fL   MCH 27.5 26.0 - 34.0 pg   MCHC 32.4 30.0 - 36.0 g/dL   RDW 14.3 11.5 - 15.5 %   Platelets 289 150 - 400 K/uL  I-STAT 4, (NA,K, GLUC, HGB,HCT)     Status: Abnormal   Collection Time: 07/10/16  5:33 PM  Result Value Ref Range   Sodium 137 135 - 145 mmol/L   Potassium 4.3 3.5 - 5.1 mmol/L   Glucose, Bld 188 (H) 65 - 99 mg/dL   HCT 31.0 (L) 36.0 - 46.0 %   Hemoglobin 10.5 (L) 12.0 - 15.0 g/dL  Magnesium     Status: Abnormal   Collection Time: 07/11/16  3:27 AM  Result Value Ref Range   Magnesium 1.3 (L) 1.7 - 2.4 mg/dL  Basic metabolic panel     Status: Abnormal   Collection Time: 07/11/16  3:27 AM  Result Value Ref Range   Sodium 137 135 - 145 mmol/L   Potassium 5.3 (H) 3.5 - 5.1 mmol/L   Chloride 108 101 - 111 mmol/L   CO2 24 22 - 32 mmol/L   Glucose, Bld 122 (H) 65 - 99 mg/dL   BUN 5 (L) 6 - 20 mg/dL   Creatinine, Ser 0.99 0.44 - 1.00 mg/dL   Calcium 7.4 (L) 8.9 - 10.3 mg/dL   GFR calc non Af Amer 59 (L) >60 mL/min   GFR calc Af Amer >60 >60 mL/min   Anion gap 5 5 - 15  CBC      Status: Abnormal   Collection Time: 07/11/16  3:27 AM  Result Value Ref Range   WBC 15.3 (H) 4.0 - 10.5 K/uL   RBC 3.51 (L) 3.87 - 5.11 MIL/uL   Hemoglobin 9.8 (L) 12.0 - 15.0 g/dL   HCT 29.7 (L) 36.0 - 46.0 %   MCV 84.6 78.0 - 100.0 fL   MCH 27.9 26.0 - 34.0 pg   MCHC 33.0 30.0 - 36.0 g/dL   RDW 14.5 11.5 - 15.5 %   Platelets 172 150 - 400 K/uL     Studies/Results: No results found.  Marland Kitchen alvimopan  12 mg Oral BID  . chlorhexidine  60 mL Topical Once  . Chlorhexidine Gluconate Cloth  6 each Topical Once  . enalaprilat  1.25 mg Intravenous Q6H  . pantoprazole (PROTONIX) IV  40 mg Intravenous QHS     Assessment/Plan: s/p Procedure(s): LAPAROSCOPY converted to open sigmoid colon resection with colostomy  SMALL BOWEL RESECTION repair of small bowel x 2   POD #1 - sigmoid colectomy with colostomy, SBR, takedown splenic flexure Suspect diverticulitis, both acute and chronic--- pathology pending IV Zosyn. Up to chair Wound and ostomy consult Twice a day dressing changes for now.  Should be good candidate for VAC.  Mild hypovolemia  Will give another 1 L bolus normal saline.  Does not appear to need blood transfusion at this time. Check labs tomorrow  DVT prophylaxis.  Start Lovenox today. Mild obesity Lower extremity edema.  Echo with normal LV function.  @PROBHOSP @  LOS: 6 days    Danel Studzinski M 07/11/2016  . .prob

## 2016-07-11 NOTE — Consult Note (Signed)
[place] Nurse ostomy consult note Stoma type/location: LUQ colostomy created emergently on 07/10/16 by Dr. Zenaida Niece assessment/size: Oval ostomy measuring 2 and 1/2 inches from 3-6 o'clock and 1 and 3/4 inches from 12-6 o'clock. Deep crease from 6-12 o'clock (medial edge) Peristomal assessment: intact, with deep crease from 6-12'oclock Treatment options for stomal/peristomal skin: 1.5 skin barrier rings, one circumferentially, one-half from 6-12 o'clock in crease Output: small amount of brown liquid effluent, no flatus. Ostomy pouching: 1pc.flat flexible ostomy pouch Kellie Simmering [phone] with 1.5 skin barrier rings Kellie Simmering [phone] Education provided: Patient is up in chair and has been premedicated. Reclined in chair in ICU for assessment and procedure. Father in room, but not participating in care or session. Patient lives with her father. Is cooperative, but tearful, anticipating pain and guarding. Educational booklet left in room.  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 Listening to instruction provided to bedside nurse about importance of sizing, making a pattern and preparing pouching sytem, including the need to "fill in" the deep crease at the medial edge of ostomy. Patient will require the support of a [facility] for continued instruction and followup on the new ostomy, which is oval and in a deep crease.  If you agree, please order so that process can be initiated. Some challenges are anticipated with self care at this time due to stoma location and body contour in the parastomal plane. Enrolled patient in [place] program: No. I will wait to see if pouching system applied today is effective.  May require soft convexity in the future, but it is difficult to ascertain at this time. [place] nursing team will follow along with you, and will remain available to this patient, the nursing and medical teams for continued pouching and ostomy management instruction.  Please  re-consult if needed. Thanks, Maudie Flakes, MSN, RN, [place], Arther Abbott  Pager# [phone]

## 2016-07-11 NOTE — Care Management Note (Signed)
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 Will continue to follow patient progress. Discharge Planning: following for needs Expected discharge date: [id number] Velva Harman, BSN, [place], [place]   Expected Discharge Date:   (unknown)               Expected Discharge Plan:  Home/Self Care  In-House Referral:     Discharge planning Services  CM Consult  Post Acute Care Choice:    Choice offered to:     DME Arranged:    DME Agency:     HH Arranged:    [place] Agency:     Status of Service:  In process, will continue to follow  If discussed at Long Length of Stay Meetings, dates discussed:    Additional Comments:  Leeroy Cha, RN 07/11/2016, 8:54 AM

## 2016-07-12 LAB — BASIC METABOLIC PANEL
Anion gap: 5 (ref 5–15)
BUN: 9 mg/dL (ref 6–20)
CHLORIDE: 108 mmol/L (ref 101–111)
CO2: 23 mmol/L (ref 22–32)
Calcium: 7.3 mg/dL — ABNORMAL LOW (ref 8.9–10.3)
Creatinine, Ser: 0.88 mg/dL (ref 0.44–1.00)
GFR calc Af Amer: >60 mL/min (ref >60)
GFR calc non Af Amer: >60 mL/min (ref >60)
GLUCOSE: 92 mg/dL (ref 65–99)
POTASSIUM: 3.9 mmol/L (ref 3.5–5.1)
SODIUM: 136 mmol/L (ref 135–145)

## 2016-07-12 LAB — CBC
HCT: 24.7 % — ABNORMAL LOW (ref 36.0–46.0)
Hemoglobin: 8.3 g/dL — ABNORMAL LOW (ref 12.0–15.0)
MCH: 28.5 pg (ref 26.0–34.0)
MCHC: 33.6 g/dL (ref 30.0–36.0)
MCV: 84.9 fL (ref 78.0–100.0)
Platelets: 238 10*3/uL (ref 150–400)
RBC: 2.91 MIL/uL — ABNORMAL LOW (ref 3.87–5.11)
RDW: 14.7 % (ref 11.5–15.5)
WBC: 14.6 10*3/uL — ABNORMAL HIGH (ref 4.0–10.5)

## 2016-07-12 LAB — CORTISOL: Cortisol, Plasma: 13.9 ug/dL

## 2016-07-12 LAB — MAGNESIUM: MAGNESIUM: 1.8 mg/dL (ref 1.7–2.4)

## 2016-07-12 LAB — MRSA PCR SCREENING: MRSA by PCR: NEGATIVE

## 2016-07-12 MED ORDER — LEVOTHYROXINE SODIUM 112 MCG PO TABS
112.0000 ug | ORAL_TABLET | Freq: Every day | ORAL | Status: DC
  Administered 2016-07-13 – 2016-07-17 (×5): 112 ug via ORAL
  Filled 2016-07-12 (×5): qty 1

## 2016-07-12 NOTE — Consult Note (Addendum)
[place] Nurse wound consult note Reason for Consult: Pt was already being followed by the [facility] team for ostomy teaching sessions.  Requested by the surgical team to apply Vac to abd wound today. Wound type: Full thickness post-op wound to midline abd Measurement: 15X6X5cm Wound bed: beefy red with yellow adipose interspersed throughout the wound Drainage (amount, consistency, odor) small amt pink drainage, no odor Periwound: Intact skin with deep creases in the abd folds surrounding the wound Dressing procedure/placement/frequency: Pt medicated for pain prior to the procedure.  Applied one piece black foam to 111mm cont suction; pt tolerated with mod amt pain.  Plan for bedside nurses to change Q Tues/Thurs/Sat WOC team will continue to follow for ostomy teaching sessions. Vac dressing is located far enough from ostomy site that the pouch should not have to be changed with each  Vac change. Julien Girt MSN, RN, [place], [place], [place]

## 2016-07-12 NOTE — Consult Note (Signed)
   [facility] Inpatient Consult   07/12/2016  [name] [birth year] [id number]   Came to visit [name] on behalf of [place] to [employer] program for [employer] employees/dependents with [employer]. Went to bedside to speak with her about Link to [employer]. She was just transferred from ICU/stepdown unit. Therefore, conversation was brief. Provided Link to [employer] Civil engineer, contracting. Confirmed best contact number for post discharge call as [phone] (cell) and [phone] (home). Will continue to follow. Made inpatient RNCM aware.   Marthenia Rolling, MSN-Ed, RN,BSN [facility] Liaison [phone]

## 2016-07-12 NOTE — Progress Notes (Addendum)
Patient ID: [name], female   DOB: 1955/04/30, 61 y.o.   MRN: [id number]  PROGRESS NOTE    [name]  XIP:[id number] DOB: [birth year] DOA: 07/05/2016 PCP: Patient, No Pcp Per   Brief Narrative:  61 y.o.femalewith medical history significant of osteoarthritis, mild dyspnea on exertion, hyperlipidemia, hypertension, hypothyroidism, morbid obesity, prediabetes, Schamberg disease who presentedfrom [facility] ED after presenting there with 4-6 weeks of recurrent abdominal pain, on and off nausea/vomiting and diarrhea. She was found to have a large bowel obstruction possibly secondary to sigmoid colon mass. General surgery and GI has been called for consult. Patient has an NG tube in place.She had flexible sigmoidoscopy with stent placement on 07/06/2016 by Dr. Benson Norway. She is planned for surgery today on 07/10/2016.  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  Hypokalemia/hypomagnesium --replace prn, keep K.4, mag >2,   Hypothyroidism --d/c iv synthroid, Resume oral levothyroxine now that patient is started on clears.  Hypertension --Hold lisinopril while nothing by mouth. --bp improved after hydration, continue home home bp meds  Pre-diabetes -a1c pending --CBG monitoring every 6 hours.  Chronic  bilateral Lower extremities edema. --Echo shows normal LV function with grade 1 diastolic dysfunction --d/c ivf on 5/13, now started on clears, monitor volume status, continue home home meds demadex   DVT prophylaxis:SCDs.  Code Status:Full code. Family Communication:none at bedside Disposition Plan:transfer to med surg Consultants:GI and general surgery  Procedure:  Sigmoidoscopy on 07/06/2016 which showed sigmoid colon stricture status post stent placement sigmoid colectomy with colostomy, SBR, takedown splenic flexure on 5/29  Echo: On 07/06/2016:Study Conclusions  - Left ventricle: The cavity size was normal. Wall thickness was increased in a pattern of mild LVH. Systolic function was normal. The estimated ejection fraction was in the range of 60% to 65%. Wall motion was normal; there were no regional wall motion abnormalities. Doppler parameters are consistent with abnormal left ventricular relaxation (grade 1 diastolic dysfunction). - Mitral valve: Calcified annulus. Mildly thickened leaflets .  Impressions:  - Normal LV systolic function; mild diastolic dysfunction; mild LVH.  Antimicrobials:zosyn  Subjective: Sitting up in chair, denies pain, no n/v, no fever, report still feels "sore" in her stomach, colostomy with liquid output, drain with serosanguineous fluids  Objective: Vitals:   07/12/16 0200 07/12/16 0400 07/12/16 0509 07/12/16 0605  BP: (!) 144/66 (!) 154/63    Pulse: 99 99    Resp: 13 15    Temp:   98 F (36.7 C)   TempSrc:   Oral   SpO2: 92% 94%    Weight:    102.8 kg (226 lb 10.1 oz)  Height:        Intake/Output Summary (Last 24 hours) at 07/12/16 0724 Last data filed at 07/12/16 0443  Gross per 24 hour  Intake          3141.67 ml  Output  880 ml  Net          2261.67 ml   Filed Weights   07/10/16 1136 07/11/16 0525 07/12/16 0605  Weight: 98.7 kg (217 lb 9.5 oz) 103.2 kg (227 lb 8.2 oz) 102.8 kg  (226 lb 10.1 oz)    Examination:  General exam: Appears calm and comfortable  Respiratory system: Bilateral decreased breath sound at bases Cardiovascular system: S1 & S2 heard,Rate controlled  Gastrointestinal system: post op changes, +colostomy, +drain. Normal bowel sounds heard. Central nervous system: Alert and oriented. No focal neurological deficits. Moving extremities Extremities: chronic venous staNo cyanosis, clubbing, edema   Data Reviewed: I have personally reviewed following labs and imaging studies  CBC:  Recent Labs Lab 07/05/16 1843 07/06/16 0441 07/07/16 0536  07/10/16 0455 07/10/16 1729 07/10/16 1733 07/11/16 0327 07/11/16 1213 07/12/16 0338  WBC 6.2 5.0 8.7  --  10.2 14.1*  --  15.3* 15.5* 14.6*  NEUTROABS 4.1 2.4 6.0  --  6.9  --   --   --   --   --   HGB 12.6 12.1 11.4*  < > 11.6* 10.9* 10.5* 9.8* 9.5* 8.3*  HCT 38.9 37.5 34.9*  < > 35.8* 33.6* 31.0* 29.7* 28.6* 24.7*  MCV 85.7 86.0 86.2  --  84.4 84.6  --  84.6 84.6 84.9  PLT 251 237 208  --  271 289  --  172 224 238  < > = values in this interval not displayed. Basic Metabolic Panel:  Recent Labs Lab 07/05/16 1843  07/08/16 0056 07/09/16 0451 07/10/16 0455 07/10/16 1733 07/11/16 0327 07/11/16 1213 07/12/16 0338  NA 141  < > 139 139 137 137 137 137 136  K 3.4*  < > 3.6 4.0 4.6 4.3 5.3* 4.2 3.9  CL 111  < > 111 110 107  --  108 108 108  CO2 21*  < > 20* 23 24  --  24 23 23   GLUCOSE 101*  < > 98 117* 114* 188* 122* 113* 92  BUN 14  < > 7 <5* <5*  --  5* 7 9  CREATININE 0.79  < > 0.80 0.70 0.75  --  0.99 1.04* 0.88  CALCIUM 8.3*  < > 7.8* 8.0* 8.3*  --  7.4* 7.2* 7.3*  MG 1.8  --  1.9 1.8  --   --  1.3*  --  1.8  PHOS 3.1  --   --   --   --   --   --   --   --   < > = values in this interval not displayed. GFR: Estimated Creatinine Clearance: 76.3 mL/min (by C-G formula based on SCr of 0.88 mg/dL). Liver Function Tests:  Recent Labs Lab 07/05/16 1843  AST 17  ALT 14  ALKPHOS 61    BILITOT 0.8  PROT 6.9  ALBUMIN 3.5   No results for input(s): LIPASE, AMYLASE in the last 168 hours. No results for input(s): AMMONIA in the last 168 hours. Coagulation Profile: No results for input(s): INR, PROTIME in the last 168 hours. Cardiac Enzymes: No results for input(s): CKTOTAL, CKMB, CKMBINDEX, TROPONINI in the last 168 hours. BNP (last 3 results) No results for input(s): PROBNP in the last 8760 hours. HbA1C: No results for input(s): HGBA1C in the last 72 hours. CBG:  Recent Labs Lab 07/08/16 0005 07/08/16 0559 07/08/16 1135 07/08/16 1723 07/10/16 1647  GLUCAP 101* 108* 109* 139* 147*   Lipid Profile: No results for input(s): CHOL, HDL,  LDLCALC, TRIG, CHOLHDL, LDLDIRECT in the last 72 hours. Thyroid Function Tests: No results for input(s): TSH, T4TOTAL, FREET4, T3FREE, THYROIDAB in the last 72 hours. Anemia Panel: No results for input(s): VITAMINB12, FOLATE, FERRITIN, TIBC, IRON, RETICCTPCT in the last 72 hours. Sepsis Labs:  Recent Labs Lab 07/11/16 1213  LATICACIDVEN 1.3    No results found for this or any previous visit (from the past 240 hour(s)).       Radiology Studies: No results found.      Scheduled Meds: . alvimopan  12 mg Oral BID  . chlorhexidine  60 mL Topical Once  . chlorhexidine  15 mL Mouth Rinse BID  . Chlorhexidine Gluconate Cloth  6 each Topical Once  . enoxaparin (LOVENOX) injection  40 mg Subcutaneous Q24H  . levothyroxine  50 mcg Intravenous Daily  . mouth rinse  15 mL Mouth Rinse q12n4p  . pantoprazole (PROTONIX) IV  40 mg Intravenous QHS   Continuous Infusions: . sodium chloride    . sodium chloride    . sodium chloride 125 mL/hr at 07/12/16 0026  . piperacillin-tazobactam (ZOSYN)  IV 3.375 g (07/12/16 0439)     LOS: 7 days      Time spent> 50mins  Estephanie Hubbs, MD PhD [facility] Pager [phone]  If 7PM-7AM, please contact night-coverage [email or web address] Password [facility] 07/12/2016, 7:24 AM

## 2016-07-12 NOTE — Progress Notes (Signed)
Pharmacy Brief Note - Alvimopan (Entereg)   The standing order set for alvimopan (Entereg) includes an automatic order to discontinue the drug after the patient has had a bowel movement. The change was approved by the [place] and the Medical Executive Committee.   This patient has had bowel movements documented by nursing (large amount of stool in ostomy bag). Therefore, alvimopan has been discontinued. If there are questions, please contact the pharmacy at [phone].   Thank you-  Doreene Eland, PharmD, BCPS.   Pager: [phone]

## 2016-07-12 NOTE — Progress Notes (Signed)
2 Days Post-Op  Subjective: Stable and alert.  Feeling a little better.  Denies nausea. Having some stool per ostomy. Getting out of bed  Appreciate the [place] consult.  Ostomy in a skin crease and will need HH RN.  Hemoglobin 8.3.  WBC 14,600 potassium 3.9.  Glucose 92.  Creatinine 0.88.  Objective: Vital signs in last 24 hours: Temp:  [98 F (36.7 C)-98.5 F (36.9 C)] 98 F (36.7 C) (05/31 0509) Pulse Rate:  [96-109] 99 (05/31 0400) Resp:  [11-21] 15 (05/31 0400) BP: (80-154)/(31-67) 154/63 (05/31 0400) SpO2:  [87 %-98 %] 94 % (05/31 0400) Weight:  [102.8 kg (226 lb 10.1 oz)] 102.8 kg (226 lb 10.1 oz) (05/31 0605) Last BM Date: 07/05/16  Intake/Output from previous day: 05/30 0701 - 05/31 0700 In: 3141.7 [I.V.:2625; IV Piggyback:516.7] Out: 880 [Urine:800; Drains:80] Intake/Output this shift: Total I/O In: 1400 [I.V.:1250; IV Piggyback:150] Out: 375 [Urine:350; Drains:25]    EXAM General appearance:  Looks good considering the extent of surgery.  Alert.  Mental status normal.  Mild to moderate distress from incisional pain.  Appropriate. Resp: clear to auscultation bilaterally GI: Soft.  Appropriately tender.  Midline wound intact.  Packed open without bleeding.  Ostomy pink with small amount of stool in bag.  JP drainage serosanguineous. Extremities: No edema.  Nontender.   Lab Results:  Results for orders placed or performed during the hospital encounter of 07/05/16 (from the past 24 hour(s))  CBC     Status: Abnormal   Collection Time: 07/11/16 12:13 PM  Result Value Ref Range   WBC 15.5 (H) 4.0 - 10.5 K/uL   RBC 3.38 (L) 3.87 - 5.11 MIL/uL   Hemoglobin 9.5 (L) 12.0 - 15.0 g/dL   HCT 28.6 (L) 36.0 - 46.0 %   MCV 84.6 78.0 - 100.0 fL   MCH 28.1 26.0 - 34.0 pg   MCHC 33.2 30.0 - 36.0 g/dL   RDW 14.6 11.5 - 15.5 %   Platelets 224 150 - 400 K/uL  Basic metabolic panel     Status: Abnormal   Collection Time: 07/11/16 12:13 PM  Result Value Ref Range   Sodium  137 135 - 145 mmol/L   Potassium 4.2 3.5 - 5.1 mmol/L   Chloride 108 101 - 111 mmol/L   CO2 23 22 - 32 mmol/L   Glucose, Bld 113 (H) 65 - 99 mg/dL   BUN 7 6 - 20 mg/dL   Creatinine, Ser 1.04 (H) 0.44 - 1.00 mg/dL   Calcium 7.2 (L) 8.9 - 10.3 mg/dL   GFR calc non Af Amer 56 (L) >60 mL/min   GFR calc Af Amer >60 >60 mL/min   Anion gap 6 5 - 15  Lactic acid, plasma     Status: None   Collection Time: 07/11/16 12:13 PM  Result Value Ref Range   Lactic Acid, Venous 1.3 0.5 - 1.9 mmol/L  CBC     Status: Abnormal   Collection Time: 07/12/16  3:38 AM  Result Value Ref Range   WBC 14.6 (H) 4.0 - 10.5 K/uL   RBC 2.91 (L) 3.87 - 5.11 MIL/uL   Hemoglobin 8.3 (L) 12.0 - 15.0 g/dL   HCT 24.7 (L) 36.0 - 46.0 %   MCV 84.9 78.0 - 100.0 fL   MCH 28.5 26.0 - 34.0 pg   MCHC 33.6 30.0 - 36.0 g/dL   RDW 14.7 11.5 - 15.5 %   Platelets 238 150 - 400 K/uL  Basic metabolic panel  Status: Abnormal   Collection Time: 07/12/16  3:38 AM  Result Value Ref Range   Sodium 136 135 - 145 mmol/L   Potassium 3.9 3.5 - 5.1 mmol/L   Chloride 108 101 - 111 mmol/L   CO2 23 22 - 32 mmol/L   Glucose, Bld 92 65 - 99 mg/dL   BUN 9 6 - 20 mg/dL   Creatinine, Ser 0.88 0.44 - 1.00 mg/dL   Calcium 7.3 (L) 8.9 - 10.3 mg/dL   GFR calc non Af Amer >60 >60 mL/min   GFR calc Af Amer >60 >60 mL/min   Anion gap 5 5 - 15  Magnesium     Status: None   Collection Time: 07/12/16  3:38 AM  Result Value Ref Range   Magnesium 1.8 1.7 - 2.4 mg/dL     Studies/Results: No results found.  Marland Kitchen alvimopan  12 mg Oral BID  . chlorhexidine  60 mL Topical Once  . chlorhexidine  15 mL Mouth Rinse BID  . Chlorhexidine Gluconate Cloth  6 each Topical Once  . enoxaparin (LOVENOX) injection  40 mg Subcutaneous Q24H  . levothyroxine  50 mcg Intravenous Daily  . mouth rinse  15 mL Mouth Rinse q12n4p  . pantoprazole (PROTONIX) IV  40 mg Intravenous QHS     Assessment/Plan: s/p Procedure(s): LAPAROSCOPY converted to open sigmoid  colon resection with colostomy  SMALL BOWEL RESECTION repair of small bowel x 2   POD #2 - sigmoid colectomy with colostomy, SBR, takedown splenic flexure Suspect diverticulitis, both acute and chronic--- pathology pending IV Zosyn. Up to chair Discontinue Foley Start clear liquids Transfer to MedSurg unit Wound and ostomy consult Twice a day dressing changes for now.  Should be good candidate for VAC. Defer to [facility] Check pathology    Mild hypovolemia   better now.  Hemoglobin a little bit lower.  Check labs tomorrow.  Doubt active bleeding.  DVT prophylaxis.   Lovenox  Mild obesity Lower extremity edema.  Echo with normal LV function.  @PROBHOSP @  LOS: 7 days    Chesnie Capell M 07/12/2016  . .prob

## 2016-07-12 NOTE — Progress Notes (Signed)
PT Cancellation Note  Patient Details Name: [name] MRN: [id number] DOB: [birth year]   Cancelled Treatment:    Reason Eval/Treat Not Completed: Other (comment)patient reports that she just ambulated.   [place] PT [phone]  [name] 07/12/2016, 4:10 PM

## 2016-07-13 LAB — BASIC METABOLIC PANEL
Anion gap: 5 (ref 5–15)
BUN: 7 mg/dL (ref 6–20)
CHLORIDE: 108 mmol/L (ref 101–111)
CO2: 24 mmol/L (ref 22–32)
CREATININE: 0.78 mg/dL (ref 0.44–1.00)
Calcium: 7.3 mg/dL — ABNORMAL LOW (ref 8.9–10.3)
GFR calc Af Amer: >60 mL/min (ref >60)
GFR calc non Af Amer: >60 mL/min (ref >60)
Glucose, Bld: 82 mg/dL (ref 65–99)
Potassium: 3.3 mmol/L — ABNORMAL LOW (ref 3.5–5.1)
SODIUM: 137 mmol/L (ref 135–145)

## 2016-07-13 LAB — CBC
HCT: 22.6 % — ABNORMAL LOW (ref 36.0–46.0)
HEMOGLOBIN: 7.5 g/dL — AB (ref 12.0–15.0)
MCH: 28.3 pg (ref 26.0–34.0)
MCHC: 33.2 g/dL (ref 30.0–36.0)
MCV: 85.3 fL (ref 78.0–100.0)
Platelets: 245 10*3/uL (ref 150–400)
RBC: 2.65 MIL/uL — ABNORMAL LOW (ref 3.87–5.11)
RDW: 14.7 % (ref 11.5–15.5)
WBC: 10.2 10*3/uL (ref 4.0–10.5)

## 2016-07-13 LAB — MAGNESIUM: MAGNESIUM: 1.8 mg/dL (ref 1.7–2.4)

## 2016-07-13 LAB — HEMOGLOBIN A1C
Hgb A1c MFr Bld: 5.8 % — ABNORMAL HIGH (ref 4.8–5.6)
Mean Plasma Glucose: 120 mg/dL

## 2016-07-13 MED ORDER — POTASSIUM CHLORIDE CRYS ER 20 MEQ PO TBCR
30.0000 meq | EXTENDED_RELEASE_TABLET | Freq: Two times a day (BID) | ORAL | Status: AC
  Administered 2016-07-13 (×2): 30 meq via ORAL
  Filled 2016-07-13 (×2): qty 1

## 2016-07-13 MED ORDER — POTASSIUM CHLORIDE 2 MEQ/ML IV SOLN
INTRAVENOUS | Status: DC
  Administered 2016-07-13 – 2016-07-14 (×3): via INTRAVENOUS
  Filled 2016-07-13 (×6): qty 1000

## 2016-07-13 NOTE — Progress Notes (Signed)
3 Days Post-Op  Subjective: Stable and her work alert.  Doing well, considering magnitude of surgery. Tolerating clear liquid diet.  Voiding without difficulty.  Pain controlled. Good colostomy output Pathology consistent with benign diverticulitis.  I discussed this with her  Hemoglobin drifting down to 7.5.  WBC down to 10.2.  Potassium 3.3.  Creatinine 0.78. Objective: Vital signs in last 24 hours: Temp:  [98.3 F (36.8 C)-98.7 F (37.1 C)] 98.6 F (37 C) (06/01 0530) Pulse Rate:  [77-94] 77 (06/01 0530) Resp:  [15-16] 16 (06/01 0530) BP: (112-145)/(49-65) 112/54 (06/01 0530) SpO2:  [99 %-100 %] 100 % (06/01 0530) Last BM Date: 07/12/16  Intake/Output from previous day: 05/31 0701 - 06/01 0700 In: 2150 [P.O.:480; I.V.:1520; IV Piggyback:150] Out: 1335 [Urine:650; Drains:135; Stool:550] Intake/Output this shift: Total I/O In: 460 [P.O.:240; I.V.:120; IV Piggyback:100] Out: 480 [Urine:200; Drains:80; Stool:200]    EXAM: General appearance: Alert and cooperative.  Slightly deconditioned but in no acute distress.  Mental status normal Resp: clear to auscultation bilaterally GI: Abdomen soft.  Not distended.  Some bowel sounds present.  Negative pressure dressing on midline wound.  Ostomy pink with stool output.= 550 mL per 24 hours  JP drainage serosanguineous.= 135 mL over 24 hours.  Lab Results:  Results for orders placed or performed during the hospital encounter of 07/05/16 (from the past 24 hour(s))  MRSA PCR Screening     Status: None   Collection Time: 07/12/16  7:51 AM  Result Value Ref Range   MRSA by PCR NEGATIVE NEGATIVE  CBC     Status: Abnormal   Collection Time: 07/13/16  4:22 AM  Result Value Ref Range   WBC 10.2 4.0 - 10.5 K/uL   RBC 2.65 (L) 3.87 - 5.11 MIL/uL   Hemoglobin 7.5 (L) 12.0 - 15.0 g/dL   HCT 22.6 (L) 36.0 - 46.0 %   MCV 85.3 78.0 - 100.0 fL   MCH 28.3 26.0 - 34.0 pg   MCHC 33.2 30.0 - 36.0 g/dL   RDW 14.7 11.5 - 15.5 %   Platelets 245  150 - 400 K/uL  Basic metabolic panel     Status: Abnormal   Collection Time: 07/13/16  4:22 AM  Result Value Ref Range   Sodium 137 135 - 145 mmol/L   Potassium 3.3 (L) 3.5 - 5.1 mmol/L   Chloride 108 101 - 111 mmol/L   CO2 24 22 - 32 mmol/L   Glucose, Bld 82 65 - 99 mg/dL   BUN 7 6 - 20 mg/dL   Creatinine, Ser 0.78 0.44 - 1.00 mg/dL   Calcium 7.3 (L) 8.9 - 10.3 mg/dL   GFR calc non Af Amer >60 >60 mL/min   GFR calc Af Amer >60 >60 mL/min   Anion gap 5 5 - 15  Magnesium     Status: None   Collection Time: 07/13/16  4:22 AM  Result Value Ref Range   Magnesium 1.8 1.7 - 2.4 mg/dL     Studies/Results: No results found.  . chlorhexidine  60 mL Topical Once  . chlorhexidine  15 mL Mouth Rinse BID  . Chlorhexidine Gluconate Cloth  6 each Topical Once  . enoxaparin (LOVENOX) injection  40 mg Subcutaneous Q24H  . levothyroxine  112 mcg Oral QAC breakfast  . mouth rinse  15 mL Mouth Rinse q12n4p  . pantoprazole (PROTONIX) IV  40 mg Intravenous QHS  . potassium chloride  30 mEq Oral BID     Assessment/Plan: s/p Procedure(s): LAPAROSCOPY converted  to open sigmoid colon resection with colostomy  SMALL BOWEL RESECTION repair of small bowel x 2  POD #3- sigmoid colectomy with colostomy, SBR, takedown splenic flexure Suspect diverticulitis, both acute and chronic---pathology benign diverticulitis IV Zosyn. Up to chair Advance to full liquids. [facility] assistance with midline wound and ostomy appreciated   acute blood loss anemia superimposed on chronic anemia.  I think this is simply reequilibrated and I doubt active bleeding.  Seems to be tolerating.  We'll transfuse if hemoglobin drifts  below 7.  Labs tomorrow. Hypokalemia.  I have supplemented this orally and in IV.  Labs tomorrow.  Mild hypovolemia better now.  .  DVT prophylaxis.  Lovenox  Mild obesity Lower extremity edema. Echo with normal LV function.  @PROBHOSP @  LOS: 8 days    Jocelin Schuelke  M 07/13/2016  . .prob

## 2016-07-13 NOTE — Progress Notes (Signed)
Pharmacy Antibiotic Note  [name] is a 61 y.o. female admitted on 07/05/2016 with severe diverticulitis s/p sigmoid colectomy.  Pharmacy has been consulted for Zosyn dosing.  Today, 07/13/2016 Day #4 Zosyn Afebrile WBC 10.2, improving SCr 0.78, CrCl 84 ml/min No culture data  Plan: Zosyn 3.375gm IV q8h (4hr extended infusions) Consider limiting duration pending clinical improvement   Height: 5\' 4"  (162.6 cm) Weight: 226 lb 10.1 oz (102.8 kg) IBW/kg (Calculated) : 54.7  Temp (24hrs), Avg:98.6 F (37 C), Min:98.4 F (36.9 C), Max:98.7 F (37.1 C)   Recent Labs Lab 07/10/16 0455 07/10/16 1729 07/11/16 0327 07/11/16 1213 07/12/16 0338 07/13/16 0422  WBC 10.2 14.1* 15.3* 15.5* 14.6* 10.2  CREATININE 0.75  --  0.99 1.04* 0.88 0.78  LATICACIDVEN  --   --   --  1.3  --   --     Estimated Creatinine Clearance: 84 mL/min (by C-G formula based on SCr of 0.78 mg/dL).    No Known Allergies  Antimicrobials this admission: 5/29 Cefotan pre-op 5/29 Zosyn >>  Dose adjustments this admission: ---  Microbiology results: None  Thank you for allowing pharmacy to be a part of this patient's care.  Peggyann Juba, PharmD, BCPS Pager: [phone] 07/13/2016 11:08 AM

## 2016-07-13 NOTE — Consult Note (Signed)
[place] Nurse ostomy follow up Stoma type/location: LUQ colostomy created emergently on 07/10/16 by Dr. Zenaida Niece assessment/size: Oval ostomy measuring 2 and 1/2 inches from 3-6 o'clock and 1 and 3/4 inches from 12-6 o'clock. Deep crease from 6-12 o'clock (medial edge) Peristomal assessment: intact, with deep crease from 6-12'oclock Treatment options for stomal/peristomal skin: 1.5 skin barrier rings, one circumferentially, one-half from 6-12 o'clock in crease Output: small amount of brown liquid effluent, no flatus. Ostomy pouching: 1pc.flat flexible ostomy pouch Kellie Simmering [phone] with 1.5 skin barrier rings Kellie Simmering [phone] Education provided: Patient is up in chair and has been premedicated. Reclined in chair for procedure. Father in room, but not participating in care or session. Patient lives with her father. Is cooperative and pleasant today. Educational booklet left in room.  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 Some challenges are anticipated with self care at this time due to stoma location and body contour in the parastomal plane. Enrolled patient in [place] program: No. I will wait to see if pouching system applied today is effective.  May require soft convexity in the future, but it is difficult to ascertain at this time.  At this time, I will continue to use the most flexible pouch we have due to abdominal folds.  [place] nursing team will follow along with you, and will remain available to this patient, the nursing and medical teams for continued pouching and ostomy management instruction.   Domenic Moras RN BSN [place] Pager [phone]

## 2016-07-13 NOTE — Progress Notes (Addendum)
Patient ID: [name], female   DOB: 06-Sep-1955, 61 y.o.   MRN: [id number]  PROGRESS NOTE    AMOREENA NEUBERT  QIO:[id number] DOB: 1955/12/19 DOA: 07/05/2016 PCP: Patient, No Pcp Per   Brief Narrative:  61 y.o.femalewith medical history significant of osteoarthritis, mild dyspnea on exertion, hyperlipidemia, hypertension, hypothyroidism, morbid obesity, prediabetes, Schamberg disease who presented[facility] ED after presenting there with 4-6 weeks of recurrent abdominal pain, on and off nausea/vomiting and diarrhea. She was found to have a large bowel obstruction possibly secondary to sigmoid colon mass. General surgery and GI has been called for consult. Patient has an NG tube in place.She had flexible sigmoidoscopy with stent placement on 07/06/2016 by Dr. Benson Norway. She is planned for surgery today on 07/10/2016.  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  Hypokalemia/hypomagnesium --replace prn, keep K.4, mag >2,   Hypothyroidism --d/c iv synthroid, Resume oral levothyroxine now that patient is started on clears.  Hypertension --Hold lisinopril while nothing by mouth. --bp improved after hydration, continue home home bp meds  Pre-diabetes -a1c 5.8 --CBG monitoring every 6 hours.  Chronic  bilateral Lower extremities edema. --Echo shows normal LV function with grade 1 diastolic dysfunction --general surgery restarted ivf,  monitor volume status, continue hold home meds demadex   DVT prophylaxis:SCDs.  Code Status:Full code. Family Communication:none at bedside Disposition Plan:likely home with home health in a few days with general surgery clearance Consultants:GI and general surgery  Procedure:  Sigmoidoscopy on 07/06/2016 which showed sigmoid colon stricture status post stent placement sigmoid colectomy with colostomy, SBR, takedown splenic flexure on 5/29  Echo: On 07/06/2016:Study Conclusions  - Left ventricle: The cavity size was normal. Wall thickness was increased in a pattern of mild LVH. Systolic function was normal. The estimated ejection fraction was in the range of 60% to 65%. Wall motion was normal; there were no regional wall motion abnormalities. Doppler parameters are consistent with abnormal left ventricular relaxation (grade 1 diastolic dysfunction). - Mitral valve: Calcified annulus. Mildly thickened leaflets .  Impressions:  - Normal LV systolic function; mild diastolic dysfunction; mild LVH.  Antimicrobials:zosyn  Subjective: Sitting up in chair, feeling better  Objective: Vitals:   07/12/16 1000 07/12/16 1327 07/12/16 2105 07/13/16 0530  BP: (!) 121/49 133/65 (!) 145/63 (!) 112/54  Pulse: 93 94 80 77  Resp: 15 16 16 16   Temp:  98.4 F (36.9 C) 98.7 F (37.1 C) 98.6 F (37 C)  TempSrc:  Oral Oral Oral  SpO2: 99% 99% 100% 100%  Weight:      Height:        Intake/Output Summary (Last 24 hours) at 07/13/16 0827 Last data filed at 07/13/16 0600  Gross per 24 hour  Intake             2150 ml  Output             1335 ml  Net              815 ml   Filed Weights   07/10/16 1136 07/11/16 0525 07/12/16 0605  Weight: 98.7 kg (217 lb 9.5 oz) 103.2 kg (227 lb 8.2 oz) 102.8 kg (226 lb 10.1 oz)     Examination:  General exam: Appears calm and comfortable  Respiratory system: Bilateral decreased breath sound at bases Cardiovascular system: S1 & S2 heard,Rate controlled  Gastrointestinal system: post op changes, +colostomy, +drain. Normal bowel sounds heard. Central nervous system: Alert and oriented. No focal neurological deficits. Moving extremities Extremities: chronic venous staNo cyanosis, clubbing, edema   Data Reviewed: I have personally reviewed following labs and imaging studies  CBC:  Recent Labs Lab 07/07/16 0536  07/10/16 0455 07/10/16 1729 07/10/16 1733 07/11/16 0327 07/11/16 1213 07/12/16 0338 07/13/16 0422  WBC 8.7  --  10.2 14.1*  --  15.3* 15.5* 14.6* 10.2  NEUTROABS 6.0  --  6.9  --   --   --   --   --   --   HGB 11.4*  < > 11.6* 10.9* 10.5* 9.8* 9.5* 8.3* 7.5*  HCT 34.9*  < > 35.8* 33.6* 31.0* 29.7* 28.6* 24.7* 22.6*  MCV 86.2  --  84.4 84.6  --  84.6 84.6 84.9 85.3  PLT 208  --  271 289  --  172 224 238 245  < > = values in this interval not displayed. Basic Metabolic Panel:  Recent Labs Lab 07/08/16 0056 07/09/16 0451 07/10/16 0455 07/10/16 1733 07/11/16 0327 07/11/16 1213 07/12/16 0338 07/13/16 0422  NA 139 139 137 137 137 137 136 137  K 3.6 4.0 4.6 4.3 5.3* 4.2 3.9 3.3*  CL 111 110 107  --  108 108 108 108  CO2 20* 23 24  --  24 23 23 24   GLUCOSE 98 117* 114* 188* 122* 113* 92 82  BUN 7 <5* <5*  --  5* 7 9 7   CREATININE 0.80 0.70 0.75  --  0.99 1.04* 0.88 0.78  CALCIUM 7.8* 8.0* 8.3*  --  7.4* 7.2* 7.3* 7.3*  MG 1.9 1.8  --   --  1.3*  --  1.8 1.8   GFR: Estimated Creatinine Clearance: 84 mL/min (by C-G formula based on SCr of 0.78 mg/dL). Liver Function Tests: No results for input(s): AST, ALT, ALKPHOS, BILITOT, PROT, ALBUMIN in the last 168 hours. No results for input(s): LIPASE, AMYLASE in the last 168 hours. No results for input(s): AMMONIA in the last 168 hours. Coagulation Profile: No results for input(s): INR, PROTIME  in the last 168 hours. Cardiac Enzymes: No results for input(s): CKTOTAL, CKMB, CKMBINDEX, TROPONINI in the last 168 hours. BNP (last 3 results) No results for input(s): PROBNP in the last 8760 hours. HbA1C: No results for input(s): HGBA1C in the last 72 hours. CBG:  Recent Labs Lab 07/08/16 0005 07/08/16 0559 07/08/16 1135 07/08/16 1723 07/10/16 1647  GLUCAP 101* 108* 109* 139* 147*   Lipid Profile: No results for input(s): CHOL, HDL, LDLCALC, TRIG, CHOLHDL, LDLDIRECT in the last 72 hours. Thyroid Function Tests: No results for input(s): TSH, T4TOTAL, FREET4, T3FREE, THYROIDAB in the last 72 hours. Anemia Panel: No results for input(s): VITAMINB12, FOLATE, FERRITIN, TIBC, IRON, RETICCTPCT in the last 72 hours. Sepsis Labs:  Recent Labs Lab 07/11/16 1213  LATICACIDVEN 1.3    Recent Results (from the past 240 hour(s))  MRSA PCR Screening     Status: None   Collection Time: 07/12/16  7:51 AM  Result Value Ref Range Status   MRSA by PCR NEGATIVE NEGATIVE Final    Comment:        The GeneXpert MRSA Assay (FDA approved for NASAL specimens only), is one component of a comprehensive MRSA colonization surveillance program. It is not intended to diagnose MRSA infection nor to guide or monitor treatment for MRSA infections.          Radiology Studies: No results found.      Scheduled Meds: . chlorhexidine  60 mL Topical Once  . chlorhexidine  15 mL Mouth Rinse BID  . Chlorhexidine Gluconate Cloth  6 each Topical Once  . enoxaparin (LOVENOX) injection  40 mg Subcutaneous Q24H  . levothyroxine  112 mcg Oral QAC breakfast  . mouth rinse  15 mL Mouth Rinse q12n4p  . pantoprazole (PROTONIX) IV  40 mg Intravenous QHS  . potassium chloride  30 mEq Oral BID   Continuous Infusions: . sodium chloride    . sodium chloride    . lactated ringers 1,000 mL with potassium chloride 40 mEq infusion 125 mL/hr at 07/13/16 0824  . piperacillin-tazobactam (ZOSYN)  IV  Stopped (07/13/16 0742)     LOS: 8 days      Time spent> 41mins  Abubakr Wieman, MD PhD [facility] Pager [phone]  If 7PM-7AM, please contact night-coverage [email or web address] Password [facility] 07/13/2016, 8:27 AM

## 2016-07-13 NOTE — Evaluation (Signed)
Physical Therapy Evaluation Patient Details Name: [name] MRN: [id number] DOB: 1955/06/15 Today's Date: 07/13/2016   History of Present Illness  Pt is a 61 year old female s/p sigmoid colectomy with colostomy, small bowel resection, takedown splenic flexure  Clinical Impression  Pt admitted with above diagnosis. Pt currently with functional limitations due to the deficits listed below (see PT Problem List).  Pt will benefit from skilled PT to increase their independence and safety with mobility to allow discharge to the venue listed below.  Pt is very pleasant and motivated.  Pt tolerated good distance of ambulation today and encouraged to ambulation this afternoon with staff.  Will check back on pt and check progress.  Pt will likely progress well to d/c home with no needs.     Follow Up Recommendations No PT follow up;Supervision for mobility/OOB    Equipment Recommendations  None recommended by PT    Recommendations for Other Services       Precautions / Restrictions Precautions Precautions: Fall Precaution Comments: JP drain, NPWT to abdomen, sigmoid colectomy with colostomy Restrictions Weight Bearing Restrictions: No      Mobility  Bed Mobility               General bed mobility comments: pt up in recliner  Transfers Overall transfer level: Needs assistance Equipment used: 2 person hand held assist Transfers: Sit to/from Stand Sit to Stand: Min guard         General transfer comment: 2 HHA assist to rise, encouraged armrests with return to recliner, pt with uncontrolled descent despite cues  Ambulation/Gait Ambulation/Gait assistance: Min guard Ambulation Distance (Feet): 160 Feet Assistive device: Rolling walker (2 wheeled) Gait Pattern/deviations: Step-through pattern;Decreased stride length;Trunk flexed     General Gait Details: initially started with IV pole however pt requiring bilateral UE upport so provided RW, verbal cues for RW  positioning and posture   Stairs            Wheelchair Mobility    Modified Rankin (Stroke Patients Only)       Balance                                             Pertinent Vitals/Pain Pain Assessment: 0-10 Pain Score: 3  Pain Location: abdomen Pain Descriptors / Indicators: Sore Pain Intervention(s): Limited activity within patient's tolerance;Monitored during session;Premedicated before session;Repositioned    Home Living Family/patient expects to be discharged to:: Private residence Living Arrangements: Parent (36 year old father, sister) Available Help at Discharge: Family Type of Home: House Home Access: Stairs to enter Entrance Stairs-Rails: Right Entrance Stairs-Number of Steps: 2 Home Layout: One level Home Equipment: Environmental consultant - 2 wheels      Prior Function Level of Independence: Independent               Hand Dominance        Extremity/Trunk Assessment        Lower Extremity Assessment Lower Extremity Assessment: Generalized weakness       Communication   Communication: No difficulties  Cognition Arousal/Alertness: Awake/alert Behavior During Therapy: WFL for tasks assessed/performed Overall Cognitive Status: Within Functional Limits for tasks assessed  General Comments      Exercises     Assessment/Plan    PT Assessment Patient needs continued PT services  PT Problem List Decreased strength;Decreased mobility;Decreased activity tolerance;Decreased knowledge of use of DME       PT Treatment Interventions DME instruction;Gait training;Therapeutic exercise;Therapeutic activities;Functional mobility training;Patient/family education    PT Goals (Current goals can be found in the Care Plan section)  Acute Rehab PT Goals PT Goal Formulation: With patient Time For Goal Achievement: 07/20/16 Potential to Achieve Goals: Good    Frequency Min 3X/week    Barriers to discharge        Co-evaluation               AM-PAC PT "6 Clicks" Daily Activity  Outcome Measure Difficulty turning over in bed (including adjusting bedclothes, sheets and blankets)?: None Difficulty moving from lying on back to sitting on the side of the bed? : A Little Difficulty sitting down on and standing up from a chair with arms (e.g., wheelchair, bedside commode, etc,.)?: A Little Help needed moving to and from a bed to chair (including a wheelchair)?: A Little Help needed walking in hospital room?: A Little Help needed climbing 3-5 steps with a railing? : A Little 6 Click Score: 19    End of Session   Activity Tolerance: Patient tolerated treatment well Patient left: in chair;with call bell/phone within reach;with family/visitor present Nurse Communication: Mobility status PT Visit Diagnosis: Other abnormalities of gait and mobility (R26.89)    Time: 1026-1040 PT Time Calculation (min) (ACUTE ONLY): 14 min   Charges:   PT Evaluation $PT Eval Low Complexity: 1 Procedure     PT G CodesCarmelia Bake, PT, DPT 07/13/2016 Pager: [phone]   [name] 07/13/2016, 11:51 AM

## 2016-07-14 DIAGNOSIS — K5792 Diverticulitis of intestine, part unspecified, without perforation or abscess without bleeding: Secondary | ICD-10-CM

## 2016-07-14 DIAGNOSIS — Z933 Colostomy status: Secondary | ICD-10-CM

## 2016-07-14 LAB — BASIC METABOLIC PANEL
Anion gap: 8 (ref 5–15)
CHLORIDE: 108 mmol/L (ref 101–111)
CO2: 22 mmol/L (ref 22–32)
CREATININE: 0.74 mg/dL (ref 0.44–1.00)
Calcium: 7.7 mg/dL — ABNORMAL LOW (ref 8.9–10.3)
GFR calc Af Amer: >60 mL/min (ref >60)
GFR calc non Af Amer: >60 mL/min (ref >60)
Glucose, Bld: 101 mg/dL — ABNORMAL HIGH (ref 65–99)
POTASSIUM: 3.8 mmol/L (ref 3.5–5.1)
Sodium: 138 mmol/L (ref 135–145)

## 2016-07-14 LAB — CBC
HEMATOCRIT: 25.1 % — AB (ref 36.0–46.0)
HEMOGLOBIN: 8.3 g/dL — AB (ref 12.0–15.0)
MCH: 27.6 pg (ref 26.0–34.0)
MCHC: 33.1 g/dL (ref 30.0–36.0)
MCV: 83.4 fL (ref 78.0–100.0)
Platelets: 357 10*3/uL (ref 150–400)
RBC: 3.01 MIL/uL — ABNORMAL LOW (ref 3.87–5.11)
RDW: 14.5 % (ref 11.5–15.5)
WBC: 9.3 10*3/uL (ref 4.0–10.5)

## 2016-07-14 LAB — MAGNESIUM: Magnesium: 1.8 mg/dL (ref 1.7–2.4)

## 2016-07-14 MED ORDER — OXYCODONE-ACETAMINOPHEN 5-325 MG PO TABS
1.0000 | ORAL_TABLET | ORAL | Status: DC | PRN
  Administered 2016-07-15 – 2016-07-17 (×5): 2 via ORAL
  Filled 2016-07-14 (×5): qty 2

## 2016-07-14 NOTE — Progress Notes (Signed)
4 Days Post-Op   Subjective: No complaints this morning. Tolerating full liquids. Colostomy functioning.  Objective: Vital signs in last 24 hours: Temp:  [97.9 F (36.6 C)-98.5 F (36.9 C)] 97.9 F (36.6 C) (06/02 0610) Pulse Rate:  [86-94] 87 (06/02 0610) Resp:  [16] 16 (06/02 0610) BP: (128-150)/(50-63) 130/62 (06/02 0610) SpO2:  [97 %-100 %] 100 % (06/02 0610) Last BM Date: 07/13/16  Intake/Output from previous day: 06/01 0701 - 06/02 0700 In: 3985.4 [P.O.:1200; I.V.:2635.4; IV Piggyback:100] Out: 2472 [Urine:2110; Drains:227; Stool:135] Intake/Output this shift: Total I/O In: -  Out: 550 [Urine:550]  General appearance: alert, cooperative and no distress GI: normal findings: soft, non-tender and Colostomy functioning Incision/Wound: VAC in place without erythema or unusual drainage. JP serosanguineous  Lab Results:   Recent Labs  07/12/16 0338 07/13/16 0422  WBC 14.6* 10.2  HGB 8.3* 7.5*  HCT 24.7* 22.6*  PLT 238 245   BMET  Recent Labs  07/12/16 0338 07/13/16 0422  NA 136 137  K 3.9 3.3*  CL 108 108  CO2 23 24  GLUCOSE 92 82  BUN 9 7  CREATININE 0.88 0.78  CALCIUM 7.3* 7.3*     Studies/Results: No results found.  Anti-infectives: Anti-infectives    Start     Dose/Rate Route Frequency Ordered Stop   07/10/16 2000  piperacillin-tazobactam (ZOSYN) IVPB 3.375 g     3.375 g 12.5 mL/hr over 240 Minutes Intravenous Every 8 hours 07/10/16 1810     07/10/16 1153  cefoTEtan in Dextrose 5% (CEFOTAN) 2-2.08 GM-% IVPB    Comments:  Georgena Spurling   : cabinet override      07/10/16 1153 07/10/16 2359   07/10/16 0600  cefoTEtan (CEFOTAN) 2 g in dextrose 5 % 50 mL IVPB     2 g 100 mL/hr over 30 Minutes Intravenous On call to O.R. 07/09/16 1125 07/10/16 1325   07/09/16 1400  neomycin (MYCIFRADIN) tablet 1,000 mg     1,000 mg Oral 3 times per day 07/09/16 1125 07/09/16 2255   07/09/16 1400  metroNIDAZOLE (FLAGYL) tablet 1,000 mg     1,000 mg Oral 3 times  per day 07/09/16 1125 07/09/16 2255      Assessment/Plan: s/p Procedure(s): LAPAROSCOPY converted to open sigmoid colon resection with colostomy  SMALL BOWEL RESECTION repair of small bowel x 2 Doing well. Advance diet. Change to oral pain medications.   LOS: 9 days    Davie Sagona T 6/2/2018Patient ID: [name], female   DOB: April 08, 1955, 61 y.o.   MRN: [id number]

## 2016-07-14 NOTE — Progress Notes (Signed)
Physical Therapy Treatment Patient Details Name: [name] MRN: [id number] DOB: 20-Oct-1955 Today's Date: 07/14/2016    History of Present Illness Pt is a 61 year old female s/p sigmoid colectomy with colostomy, small bowel resection, takedown splenic flexure    PT Comments    Pt reporting pulling sensation and found colostomy full so requested RN empty prior to ambulating (pt states she does not know how to perform emptying pouch herself).  Pt then assisted with ambulating in hallway.  Pt appears to be in more pain then yesterday and RN brought meds end of session.   Follow Up Recommendations  No PT follow up;Supervision for mobility/OOB     Equipment Recommendations  None recommended by PT    Recommendations for Other Services       Precautions / Restrictions Precautions Precautions: Fall Precaution Comments: JP drain, NPWT to abdomen, sigmoid colectomy with colostomy    Mobility  Bed Mobility Overal bed mobility: Needs Assistance Bed Mobility: Supine to Sit;Sit to Supine     Supine to sit: Mod assist;HOB elevated Sit to supine: Mod assist   General bed mobility comments: assist for LEs due to abdominal pain  Transfers Overall transfer level: Needs assistance Equipment used: Rolling walker (2 wheeled) Transfers: Sit to/from Stand Sit to Stand: Min assist         General transfer comment: verbal cues for hand placement, assist to rise and control descent  Ambulation/Gait Ambulation/Gait assistance: Min guard Ambulation Distance (Feet): 160 Feet Assistive device: Rolling walker (2 wheeled) Gait Pattern/deviations: Step-through pattern;Decreased stride length;Trunk flexed     General Gait Details: verbal cues for RW positioning and posture, pt determined to perform distance from yesterday however appears to be in more pain today   Stairs            Wheelchair Mobility    Modified Rankin (Stroke Patients Only)       Balance                                             Cognition Arousal/Alertness: Awake/alert Behavior During Therapy: WFL for tasks assessed/performed Overall Cognitive Status: Within Functional Limits for tasks assessed                                        Exercises      General Comments        Pertinent Vitals/Pain Pain Assessment: 0-10 Pain Score: 7  Pain Location: abdomen Pain Descriptors / Indicators: Sore Pain Intervention(s): Monitored during session;Limited activity within patient's tolerance;Patient requesting pain meds-RN notified;Repositioned    Home Living                      Prior Function            PT Goals (current goals can now be found in the care plan section) Progress towards PT goals: Progressing toward goals    Frequency    Min 3X/week      PT Plan Current plan remains appropriate    Co-evaluation              AM-PAC PT "6 Clicks" Daily Activity  Outcome Measure  Difficulty turning over in bed (including adjusting bedclothes, sheets and blankets)?: None Difficulty moving from lying on back  to sitting on the side of the bed? : A Little Difficulty sitting down on and standing up from a chair with arms ([name].g., wheelchair, bedside commode, etc,.)?: A Little Help needed moving to and from a bed to chair (including a wheelchair)?: A Little Help needed walking in hospital room?: A Little Help needed climbing 3-5 steps with a railing? : A Little 6 Click Score: 19    End of Session   Activity Tolerance: Patient limited by pain Patient left: in bed;with call bell/phone within reach Nurse Communication: Patient requests pain meds PT Visit Diagnosis: Other abnormalities of gait and mobility (R26.89)     Time: 7412-8786 PT Time Calculation (min) (ACUTE ONLY): 31 min  Charges:  $Gait Training: 8-22 mins                    G Codes:       [name], PT, DPT 07/14/2016 Pager: [phone]    Erin Ram  [name] 07/14/2016, 12:11 PM

## 2016-07-14 NOTE — Progress Notes (Addendum)
Patient ID: [name], female   DOB: 08/20/1955, 61 y.o.   MRN: [id number]  PROGRESS NOTE    Erin WOLPERT  [name]:[id number] DOB: Jul 01, 1955 DOA: 07/05/2016 PCP: Patient, No Pcp Per   Brief Narrative:  61 y.o.femalewith medical history significant of osteoarthritis, mild dyspnea on exertion, hyperlipidemia, hypertension, hypothyroidism, morbid obesity, prediabetes, Schamberg disease who presentedfrom [facility] ED after presenting there with 4-6 weeks of recurrent abdominal pain, on and off nausea/vomiting and diarrhea. She was found to have a large bowel obstruction possibly secondary to sigmoid colon mass. General surgery and GI has been called for consult. Patient has an NG tube in place.She had flexible sigmoidoscopy with stent placement on 07/06/2016 by Dr. Benson Norway. She is planned for surgery today on 07/10/2016.  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  Postop anemia:  hgb 10.5-9.8-8.3-7.5-8.3, close monitor, no indication for transfusion  Hypokalemia/hypomagnesium --replace prn, keep K.4, mag >2,   Hypothyroidism --d/c iv synthroid, Resume oral levothyroxine now that patient is started on clears.  Hypertension --Hold lisinopril --bp improved after hydration, continue home home bp meds, may be able to resume diuretics on  6/3  Pre-diabetes -a1c 5.8 --CBG range from 82-101  Chronic bilateral Lower extremities edema. --Echo shows normal LV function with grade 1 diastolic dysfunction --general surgery restarted ivf,  Noticed some pedal edema, d/c ivf on 6/2, monitor volume status, consider resume home meds demadex on 6/3   DVT prophylaxis:SCDs.  Code Status:Full code. Family Communication:none at bedside Disposition Plan:likely home with home health in a few days with general surgery clearance Consultants:GI and general surgery  Procedure:  Sigmoidoscopy on 07/06/2016 which showed sigmoid colon stricture status post stent placement sigmoid colectomy with colostomy, SBR, takedown splenic flexure on 5/29  Echo: On 07/06/2016:Study Conclusions  - Left ventricle: The cavity size was normal. Wall thickness was increased in a pattern of mild LVH. Systolic function was normal. The estimated ejection fraction was in the range of 60% to 65%. Wall motion was normal; there were no regional wall motion abnormalities. Doppler parameters are consistent with abnormal left ventricular relaxation (grade 1 diastolic dysfunction). - Mitral valve: Calcified annulus. Mildly thickened leaflets .  Impressions:  - Normal LV systolic function; mild diastolic dysfunction; mild LVH.  Antimicrobials:zosyn  Subjective: Feeling tired after walking with PT, denies pain, no fever,  drain /colostomy, wound vac in place. Some bilateral pedal edema  Objective: Vitals:   07/13/16 0530 07/13/16 1403 07/13/16 2211 07/14/16 0610  BP: (!) 112/54 (!) 150/63 (!) 128/50 130/62  Pulse: 77 94 86 87  Resp: 16 16 16 16   Temp: 98.6 F (37 C) 98.5 F (36.9 C) 98.3 F (36.8 C) 97.9 F (36.6 C)  TempSrc: Oral Oral Oral Oral  SpO2: 100% 99% 97% 100%  Weight:      Height:        Intake/Output Summary (Last 24 hours) at 07/14/16 0805 Last data filed at 07/14/16 [phone]  Gross per 24 hour  Intake           3985.41 ml  Output             3022 ml  Net           963.41 ml   Filed Weights   07/10/16 1136 07/11/16 0525 07/12/16 0605  Weight: 98.7 kg (217 lb 9.5 oz) 103.2 kg (227 lb 8.2 oz) 102.8 kg (226 lb 10.1 oz)    Examination:  General exam: look tired  Respiratory system: Bilateral decreased breath sound at bases Cardiovascular system: S1 & S2 heard,Rate controlled  Gastrointestinal system: post op changes, +colostomy, +drain. + wound vac, Normal bowel sounds heard. Central nervous system: Alert and oriented. No focal neurological deficits. Moving extremities Extremities: chronic venous stasis changes . Some bilateral pedal edema, No cyanosis  Data Reviewed: I have personally reviewed following labs and imaging studies  CBC:  Recent Labs Lab 07/10/16 0455 07/10/16 1729 07/10/16 1733 07/11/16 0327 07/11/16 1213 07/12/16 0338 07/13/16 0422  WBC 10.2 14.1*  --  15.3* 15.5* 14.6* 10.2  NEUTROABS 6.9  --   --   --   --   --   --   HGB 11.6* 10.9* 10.5* 9.8* 9.5* 8.3* 7.5*  HCT 35.8* 33.6* 31.0* 29.7* 28.6* 24.7* 22.6*  MCV 84.4 84.6  --  84.6 84.6 84.9 85.3  PLT 271 289  --  172 224 238 277   Basic Metabolic Panel:  Recent Labs Lab 07/08/16 0056 07/09/16 0451 07/10/16 0455 07/10/16 1733 07/11/16 0327 07/11/16 1213 07/12/16 0338 07/13/16 0422  NA 139 139 137 137 137 137 136 137  K 3.6 4.0 4.6 4.3 5.3* 4.2 3.9 3.3*  CL 111 110 107  --  108 108 108 108  CO2 20* 23 24  --  24 23 23 24   GLUCOSE 98 117* 114* 188* 122* 113* 92 82  BUN 7 <5* <5*  --  5* 7 9 7   CREATININE 0.80 0.70 0.75  --  0.99 1.04* 0.88 0.78  CALCIUM 7.8* 8.0* 8.3*  --  7.4* 7.2* 7.3* 7.3*  MG 1.9 1.8  --   --  1.3*  --  1.8 1.8   GFR: Estimated Creatinine Clearance: 84 mL/min (by C-G formula based on SCr of 0.78 mg/dL). Liver Function Tests: No results for input(s): AST, ALT, ALKPHOS, BILITOT, PROT, ALBUMIN in the last 168 hours. No results for input(s): LIPASE, AMYLASE in the last 168  hours. No results for input(s): AMMONIA in the last 168 hours. Coagulation Profile: No results for input(s): INR, PROTIME in the last 168 hours. Cardiac Enzymes: No results for input(s): CKTOTAL, CKMB, CKMBINDEX, TROPONINI in the last 168 hours. BNP (last 3 results) No results for input(s): PROBNP in the last 8760 hours. HbA1C:  Recent Labs  07/12/16 0338  HGBA1C 5.8*   CBG:  Recent Labs Lab 07/08/16 0005 07/08/16 0559 07/08/16 1135 07/08/16 1723 07/10/16 1647  GLUCAP 101* 108* 109* 139* 147*   Lipid Profile: No results for input(s): CHOL, HDL, LDLCALC, TRIG, CHOLHDL, LDLDIRECT in the last 72 hours. Thyroid Function Tests: No results for input(s): TSH, T4TOTAL, FREET4, T3FREE, THYROIDAB in the last 72 hours. Anemia Panel: No results for input(s): VITAMINB12, FOLATE, FERRITIN, TIBC, IRON, RETICCTPCT in the last 72 hours. Sepsis Labs:  Recent Labs Lab 07/11/16 1213  LATICACIDVEN 1.3    Recent Results (from the past 240 hour(s))  MRSA PCR Screening     Status: None   Collection Time:  07/12/16  7:51 AM  Result Value Ref Range Status   MRSA by PCR NEGATIVE NEGATIVE Final    Comment:        The GeneXpert MRSA Assay (FDA approved for NASAL specimens only), is one component of a comprehensive MRSA colonization surveillance program. It is not intended to diagnose MRSA infection nor to guide or monitor treatment for MRSA infections.          Radiology Studies: No results found.      Scheduled Meds: . chlorhexidine  60 mL Topical Once  . Chlorhexidine Gluconate Cloth  6 each Topical Once  . enoxaparin (LOVENOX) injection  40 mg Subcutaneous Q24H  . levothyroxine  112 mcg Oral QAC breakfast  . pantoprazole (PROTONIX) IV  40 mg Intravenous QHS   Continuous Infusions: . sodium chloride    . sodium chloride    . lactated ringers 1,000 mL with potassium chloride 40 mEq infusion 125 mL/hr at 07/14/16 0322  . piperacillin-tazobactam (ZOSYN)  IV Stopped  (07/14/16 0735)     LOS: 9 days      Time spent> 19mins  Roslin Norwood, MD PhD [facility] Pager [phone]  If 7PM-7AM, please contact night-coverage [email or web address] Password [email or web address] 07/14/2016, 8:05 AM

## 2016-07-15 DIAGNOSIS — R7303 Prediabetes: Secondary | ICD-10-CM | POA: Diagnosis not present

## 2016-07-15 DIAGNOSIS — D62 Acute posthemorrhagic anemia: Secondary | ICD-10-CM | POA: Diagnosis not present

## 2016-07-15 DIAGNOSIS — E876 Hypokalemia: Secondary | ICD-10-CM | POA: Diagnosis not present

## 2016-07-15 DIAGNOSIS — E785 Hyperlipidemia, unspecified: Secondary | ICD-10-CM | POA: Diagnosis not present

## 2016-07-15 DIAGNOSIS — K5732 Diverticulitis of large intestine without perforation or abscess without bleeding: Secondary | ICD-10-CM | POA: Diagnosis not present

## 2016-07-15 DIAGNOSIS — E039 Hypothyroidism, unspecified: Secondary | ICD-10-CM | POA: Diagnosis not present

## 2016-07-15 DIAGNOSIS — I10 Essential (primary) hypertension: Secondary | ICD-10-CM | POA: Diagnosis not present

## 2016-07-15 DIAGNOSIS — K56699 Other intestinal obstruction unspecified as to partial versus complete obstruction: Secondary | ICD-10-CM | POA: Diagnosis not present

## 2016-07-15 DIAGNOSIS — M1990 Unspecified osteoarthritis, unspecified site: Secondary | ICD-10-CM | POA: Diagnosis not present

## 2016-07-15 LAB — CBC WITH DIFFERENTIAL/PLATELET
Basophils Absolute: 0 10*3/uL (ref 0.0–0.1)
Basophils Relative: 0 %
EOS PCT: 5 %
Eosinophils Absolute: 0.5 10*3/uL (ref 0.0–0.7)
HEMATOCRIT: 24.4 % — AB (ref 36.0–46.0)
Hemoglobin: 8 g/dL — ABNORMAL LOW (ref 12.0–15.0)
LYMPHS PCT: 22 %
Lymphs Abs: 2.1 10*3/uL (ref 0.7–4.0)
MCH: 27.7 pg (ref 26.0–34.0)
MCHC: 32.8 g/dL (ref 30.0–36.0)
MCV: 84.4 fL (ref 78.0–100.0)
Monocytes Absolute: 0.8 10*3/uL (ref 0.1–1.0)
Monocytes Relative: 8 %
NEUTROS ABS: 6.3 10*3/uL (ref 1.7–7.7)
Neutrophils Relative %: 65 %
PLATELETS: 364 10*3/uL (ref 150–400)
RBC: 2.89 MIL/uL — ABNORMAL LOW (ref 3.87–5.11)
RDW: 14.6 % (ref 11.5–15.5)
WBC: 9.7 10*3/uL (ref 4.0–10.5)

## 2016-07-15 LAB — BASIC METABOLIC PANEL
Anion gap: 6 (ref 5–15)
BUN: <5 mg/dL — ABNORMAL LOW (ref 6–20)
CO2: 27 mmol/L (ref 22–32)
Calcium: 7.7 mg/dL — ABNORMAL LOW (ref 8.9–10.3)
Chloride: 107 mmol/L (ref 101–111)
Creatinine, Ser: 0.82 mg/dL (ref 0.44–1.00)
GFR calc Af Amer: >60 mL/min (ref >60)
Glucose, Bld: 97 mg/dL (ref 65–99)
POTASSIUM: 3.6 mmol/L (ref 3.5–5.1)
Sodium: 140 mmol/L (ref 135–145)

## 2016-07-15 LAB — MAGNESIUM: Magnesium: 1.8 mg/dL (ref 1.7–2.4)

## 2016-07-15 MED ORDER — POTASSIUM CHLORIDE CRYS ER 20 MEQ PO TBCR
40.0000 meq | EXTENDED_RELEASE_TABLET | Freq: Once | ORAL | Status: AC
  Administered 2016-07-15: 40 meq via ORAL
  Filled 2016-07-15: qty 2

## 2016-07-15 NOTE — Progress Notes (Signed)
Patient ID: [name], female   DOB: [birth year], 61 y.o.   MRN: [id number] 5 Days Post-Op   Subjective: No complaints this morning. Tolerating diet.  Objective: Vital signs in last 24 hours: Temp:  [98.2 F (36.8 C)-98.5 F (36.9 C)] 98.4 F (36.9 C) (06/03 0540) Pulse Rate:  [84-86] 84 (06/03 0540) Resp:  [18-20] 18 (06/03 0540) BP: (132-150)/(53-68) 150/61 (06/03 0540) SpO2:  [97 %-98 %] 98 % (06/03 0540) Last BM Date: 07/13/16  Intake/Output from previous day: 06/02 0701 - 06/03 0700 In: 1230 [P.O.:1080; IV Piggyback:150] Out: 3366 [Urine:2310; Drains:306; Stool:750] Intake/Output this shift: No intake/output data recorded.  General appearance: alert, cooperative and no distress GI: normal findings: soft, non-tender Incision/Wound: VAC in place, clean and dry. JP drainage serosanguineous  Lab Results:   Recent Labs  07/14/16 0857 07/15/16 0441  WBC 9.3 9.7  HGB 8.3* 8.0*  HCT 25.1* 24.4*  PLT 357 364   BMET  Recent Labs  07/14/16 0857 07/15/16 0441  NA 138 140  K 3.8 3.6  CL 108 107  CO2 22 27  GLUCOSE 101* 97  BUN <5* <5*  CREATININE 0.74 0.82  CALCIUM 7.7* 7.7*     Studies/Results: No results found.  Anti-infectives: Anti-infectives    Start     Dose/Rate Route Frequency Ordered Stop   07/10/16 2000  piperacillin-tazobactam (ZOSYN) IVPB 3.375 g     3.375 g 12.5 mL/hr over 240 Minutes Intravenous Every 8 hours 07/10/16 1810     07/10/16 1153  cefoTEtan in Dextrose 5% (CEFOTAN) 2-2.08 GM-% IVPB    Comments:  Georgena Spurling   : cabinet override      07/10/16 1153 07/10/16 2359   07/10/16 0600  cefoTEtan (CEFOTAN) 2 g in dextrose 5 % 50 mL IVPB     2 g 100 mL/hr over 30 Minutes Intravenous On call to O.R. 07/09/16 1125 07/10/16 1325   07/09/16 1400  neomycin (MYCIFRADIN) tablet 1,000 mg     1,000 mg Oral 3 times per day 07/09/16 1125 07/09/16 2255   07/09/16 1400  metroNIDAZOLE (FLAGYL) tablet 1,000 mg     1,000 mg Oral 3 times per day  07/09/16 1125 07/09/16 2255      Assessment/Plan: s/p Procedure(s): LAPAROSCOPY converted to open sigmoid colon resection with colostomy  SMALL BOWEL RESECTION repair of small bowel x 2 Doing well without apparent complication. No evidence of infection and will discontinue antibiotics. She should be ready for discharge tomorrow.   LOS: 10 days    [name] T 07/15/2016

## 2016-07-15 NOTE — Progress Notes (Addendum)
Patient ID: [name], female   DOB: 1955-12-11, 61 y.o.   MRN: [id number]  PROGRESS NOTE    [name]  YIR:[id number] DOB: 1955/03/07 DOA: 07/05/2016 PCP: Patient, No Pcp Per   Brief Narrative:  61 y.o.femalewith medical history significant of osteoarthritis, mild dyspnea on exertion, hyperlipidemia, hypertension, hypothyroidism, morbid obesity, prediabetes, Schamberg disease who presentedfrom [facility] ED after presenting there with 4-6 weeks of recurrent abdominal pain, on and off nausea/vomiting and diarrhea. She was found to have a large bowel obstruction possibly secondary to sigmoid colon mass. General surgery and GI has been called for consult. Patient has an NG tube in place.She had flexible sigmoidoscopy with stent placement on 07/06/2016 by Dr. Benson Norway. She is planned for surgery today on 07/10/2016.  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  Postop anemia:  hgb 10.5-9.8-8.3-7.5-8.3-8.0, close monitor, no indication for transfusion  Hypokalemia/hypomagnesium --replace prn, keep K.4, mag >2,   Hypothyroidism --d/c iv synthroid, Resume oral levothyroxine now that patient is started on clears.  Hypertension --Hold lisinopril --bp improved after hydration, continue home home bp meds, may be able to resume  diuretics on 6/4  Pre-diabetes -a1c 5.8 --CBG range from 82-101  Chronic bilateral Lower extremities edema. --Echo shows normal LV function with grade 1 diastolic dysfunction --general surgery restarted ivf,  Noticed some pedal edema, d/c ivf on 6/2, monitor volume status, consider resume home meds demadex on 6/4   DVT prophylaxis:SCDs.  Code Status:Full code. Family Communication:none at bedside Disposition Plan:likely home with home health on 6/4 with general surgery clearance Consultants:GI and general surgery  Procedure:  Sigmoidoscopy on 07/06/2016 which showed sigmoid colon stricture status post stent placement sigmoid colectomy with colostomy, SBR, takedown splenic flexure on 5/29  Echo: On 07/06/2016:Study Conclusions  - Left ventricle: The cavity size was normal. Wall thickness was increased in a pattern of mild LVH. Systolic function was normal. The estimated ejection fraction was in the range of 60% to 65%. Wall motion was normal; there were no regional wall motion abnormalities. Doppler parameters are consistent with abnormal left ventricular relaxation (grade 1 diastolic dysfunction). - Mitral valve: Calcified annulus. Mildly thickened leaflets .  Impressions:  - Normal LV systolic function; mild diastolic dysfunction; mild LVH.  Antimicrobials:zosyn  Subjective: denies pain, no fever,  drain /colostomy, wound vac in place. bilateral pedal edema has improved some  Objective: Vitals:   07/14/16 0610 07/14/16 1434 07/14/16 2144 07/15/16 0540  BP: 130/62 (!) 142/68 (!) 132/53 (!) 150/61  Pulse: 87 86 86 84  Resp: 16 20 18 18   Temp: 97.9 F (36.6 C) 98.2 F (36.8 C) 98.5 F (36.9 C) 98.4 F (36.9 C)  TempSrc: Oral Oral Oral Oral  SpO2: 100% 97% 97% 98%  Weight:      Height:        Intake/Output Summary (Last 24 hours) at 07/15/16 0844 Last data filed at 07/15/16  3354  Gross per 24 hour  Intake             1230 ml    Output             2516 ml  Net            -1286 ml   Filed Weights   07/10/16 1136 07/11/16 0525 07/12/16 0605  Weight: 98.7 kg (217 lb 9.5 oz) 103.2 kg (227 lb 8.2 oz) 102.8 kg (226 lb 10.1 oz)    Examination:  General exam: look tired  Respiratory system: Bilateral decreased breath sound at bases Cardiovascular system: S1 & S2 heard,Rate controlled  Gastrointestinal system: post op changes, +colostomy, +drain. + wound vac, Normal bowel sounds heard. Central nervous system: Alert and oriented. No focal neurological deficits. Moving extremities Extremities: chronic venous stasis changes .  bilateral pedal edema improved, No cyanosis  Data Reviewed: I have personally reviewed following labs and imaging studies  CBC:  Recent Labs Lab 07/10/16 0455  07/11/16 1213 07/12/16 0338 07/13/16 0422 07/14/16 0857 07/15/16 0441  WBC 10.2  < > 15.5* 14.6* 10.2 9.3 9.7  NEUTROABS 6.9  --   --   --   --   --  6.3  HGB 11.6*  < > 9.5* 8.3* 7.5* 8.3* 8.0*  HCT 35.8*  < > 28.6* 24.7* 22.6* 25.1* 24.4*  MCV 84.4  < > 84.6 84.9 85.3 83.4 84.4  PLT 271  < > 224 238 245 357 364  < > = values in this interval not displayed. Basic Metabolic Panel:  Recent Labs Lab 07/11/16 0327 07/11/16 1213 07/12/16 0338 07/13/16 0422 07/14/16 0857 07/15/16 0441  NA 137 137 136 137 138 140  K 5.3* 4.2 3.9 3.3* 3.8 3.6  CL 108 108 108 108 108 107  CO2 24 23 23 24 22 27   GLUCOSE 122* 113* 92 82 101* 97  BUN 5* 7 9 7  <5* <5*  CREATININE 0.99 1.04* 0.88 0.78 0.74 0.82  CALCIUM 7.4* 7.2* 7.3* 7.3* 7.7* 7.7*  MG 1.3*  --  1.8 1.8 1.8 1.8   GFR: Estimated Creatinine Clearance: 81.9 mL/min (by C-G formula based on SCr of 0.82 mg/dL). Liver Function Tests: No results for input(s): AST, ALT, ALKPHOS, BILITOT, PROT, ALBUMIN in the last 168 hours. No results for input(s): LIPASE, AMYLASE in the last 168 hours. No results for input(s): AMMONIA in the last 168 hours. Coagulation Profile: No results for  input(s): INR, PROTIME in the last 168 hours. Cardiac Enzymes: No results for input(s): CKTOTAL, CKMB, CKMBINDEX, TROPONINI in the last 168 hours. BNP (last 3 results) No results for input(s): PROBNP in the last 8760 hours. HbA1C: No results for input(s): HGBA1C in the last 72 hours. CBG:  Recent Labs Lab 07/08/16 1135 07/08/16 1723 07/10/16 1647  GLUCAP 109* 139* 147*   Lipid Profile: No results for input(s): CHOL, HDL, LDLCALC, TRIG, CHOLHDL, LDLDIRECT in the last 72 hours. Thyroid Function Tests: No results for input(s): TSH, T4TOTAL, FREET4, T3FREE, THYROIDAB in the last 72 hours. Anemia Panel: No results for input(s): VITAMINB12, FOLATE, FERRITIN, TIBC, IRON, RETICCTPCT in the last 72 hours. Sepsis Labs:  Recent Labs Lab 07/11/16 1213  LATICACIDVEN 1.3    Recent Results (from the past 240 hour(s))  MRSA PCR Screening     Status: None   Collection Time: 07/12/16  7:51 AM  Result Value Ref Range Status   MRSA by PCR NEGATIVE NEGATIVE Final    Comment:  The GeneXpert MRSA Assay (FDA approved for NASAL specimens only), is one component of a comprehensive MRSA colonization surveillance program. It is not intended to diagnose MRSA infection nor to guide or monitor treatment for MRSA infections.          Radiology Studies: No results found.      Scheduled Meds: . chlorhexidine  60 mL Topical Once  . Chlorhexidine Gluconate Cloth  6 each Topical Once  . enoxaparin (LOVENOX) injection  40 mg Subcutaneous Q24H  . levothyroxine  112 mcg Oral QAC breakfast  . pantoprazole (PROTONIX) IV  40 mg Intravenous QHS  . potassium chloride  40 mEq Oral Once   Continuous Infusions: . sodium chloride    . sodium chloride       LOS: 10 days      Time spent> 7mins  Jacque Garrels, MD PhD [facility] Pager [phone]  If 7PM-7AM, please contact night-coverage [email or web address] Password [facility] 07/15/2016, 8:44 AM

## 2016-07-16 DIAGNOSIS — K5732 Diverticulitis of large intestine without perforation or abscess without bleeding: Secondary | ICD-10-CM | POA: Diagnosis not present

## 2016-07-16 DIAGNOSIS — E876 Hypokalemia: Secondary | ICD-10-CM | POA: Diagnosis not present

## 2016-07-16 DIAGNOSIS — D62 Acute posthemorrhagic anemia: Secondary | ICD-10-CM | POA: Diagnosis not present

## 2016-07-16 DIAGNOSIS — M1990 Unspecified osteoarthritis, unspecified site: Secondary | ICD-10-CM | POA: Diagnosis not present

## 2016-07-16 DIAGNOSIS — E039 Hypothyroidism, unspecified: Secondary | ICD-10-CM | POA: Diagnosis not present

## 2016-07-16 DIAGNOSIS — E785 Hyperlipidemia, unspecified: Secondary | ICD-10-CM | POA: Diagnosis not present

## 2016-07-16 DIAGNOSIS — K56699 Other intestinal obstruction unspecified as to partial versus complete obstruction: Secondary | ICD-10-CM | POA: Diagnosis not present

## 2016-07-16 DIAGNOSIS — I10 Essential (primary) hypertension: Secondary | ICD-10-CM | POA: Diagnosis not present

## 2016-07-16 DIAGNOSIS — R7303 Prediabetes: Secondary | ICD-10-CM | POA: Diagnosis not present

## 2016-07-16 LAB — BASIC METABOLIC PANEL
Anion gap: 7 (ref 5–15)
BUN: <5 mg/dL — ABNORMAL LOW (ref 6–20)
CALCIUM: 7.7 mg/dL — AB (ref 8.9–10.3)
CHLORIDE: 108 mmol/L (ref 101–111)
CO2: 24 mmol/L (ref 22–32)
CREATININE: 0.79 mg/dL (ref 0.44–1.00)
GFR calc Af Amer: >60 mL/min (ref >60)
GFR calc non Af Amer: >60 mL/min (ref >60)
Glucose, Bld: 91 mg/dL (ref 65–99)
Potassium: 3.3 mmol/L — ABNORMAL LOW (ref 3.5–5.1)
Sodium: 139 mmol/L (ref 135–145)

## 2016-07-16 LAB — CBC WITH DIFFERENTIAL/PLATELET
BASOS PCT: 0 %
Basophils Absolute: 0 10*3/uL (ref 0.0–0.1)
EOS PCT: 5 %
Eosinophils Absolute: 0.6 10*3/uL (ref 0.0–0.7)
HCT: 23.5 % — ABNORMAL LOW (ref 36.0–46.0)
HEMOGLOBIN: 7.6 g/dL — AB (ref 12.0–15.0)
LYMPHS ABS: 2.7 10*3/uL (ref 0.7–4.0)
Lymphocytes Relative: 23 %
MCH: 27.7 pg (ref 26.0–34.0)
MCHC: 32.3 g/dL (ref 30.0–36.0)
MCV: 85.8 fL (ref 78.0–100.0)
MONO ABS: 0.8 10*3/uL (ref 0.1–1.0)
MONOS PCT: 7 %
NEUTROS PCT: 65 %
Neutro Abs: 7.6 10*3/uL (ref 1.7–7.7)
Platelets: 369 10*3/uL (ref 150–400)
RBC: 2.74 MIL/uL — AB (ref 3.87–5.11)
RDW: 14.8 % (ref 11.5–15.5)
WBC: 11.7 10*3/uL — ABNORMAL HIGH (ref 4.0–10.5)

## 2016-07-16 LAB — MAGNESIUM: Magnesium: 1.8 mg/dL (ref 1.7–2.4)

## 2016-07-16 MED ORDER — POTASSIUM CHLORIDE CRYS ER 20 MEQ PO TBCR
40.0000 meq | EXTENDED_RELEASE_TABLET | Freq: Once | ORAL | Status: AC
Start: 2016-07-16 — End: 2016-07-16
  Administered 2016-07-16: 40 meq via ORAL
  Filled 2016-07-16: qty 2

## 2016-07-16 MED ORDER — MAGNESIUM OXIDE -MG SUPPLEMENT 400 (240 MG) MG PO TABS: 400.0000 mg | ORAL_TABLET | Freq: Every day | ORAL | 0 refills | Status: DC

## 2016-07-16 MED ORDER — ACETAMINOPHEN 325 MG PO TABS: 650.0000 mg | ORAL_TABLET | ORAL | 2 refills | Status: DC | PRN

## 2016-07-16 MED ORDER — PANTOPRAZOLE SODIUM 40 MG PO TBEC
40.0000 mg | DELAYED_RELEASE_TABLET | Freq: Every day | ORAL | Status: DC
  Administered 2016-07-16: 40 mg via ORAL
  Filled 2016-07-16: qty 1

## 2016-07-16 MED ORDER — ACETAMINOPHEN 325 MG PO TABS: ORAL_TABLET | ORAL | 2 refills | Status: DC

## 2016-07-16 MED ORDER — OXYCODONE-ACETAMINOPHEN 5-325 MG PO TABS: 1.0000 | ORAL_TABLET | ORAL | 0 refills | Status: DC | PRN

## 2016-07-16 NOTE — Progress Notes (Signed)
Discharge planning, spoke with patient at beside. Chose [facility] for [facility] services, contacted [facility] for referral. Needs wound vac at home, attending approved change from [facility] to [facility] product, contacted [facility] to arrange. Vac script signed and forwarded to [facility].  [phone]

## 2016-07-16 NOTE — Progress Notes (Signed)
Home with Tylenol or Percocet for pain. I have personally reviewed the patients medication history on the [place] controlled substance database.

## 2016-07-16 NOTE — Progress Notes (Signed)
Received a call back from [facility] that they do not service the area where patient lives. [facility] and they were aware of the patient and willing to accept. Faxed d/c summary to them at [phone].

## 2016-07-16 NOTE — Progress Notes (Signed)
[facility] Progress Note  6 Days Post-Op  Subjective: CC: s/p abdominal surgery No complaints. Tolerating diet. Pain controlled. Denies fever, chills, nausea, or vomiting. Having bowel function. Has not looked at colostomy pouch - she is nervous.  Objective: Vital signs in last 24 hours: Temp:  [97.6 F (36.4 C)-98.1 F (36.7 C)] 97.8 F (36.6 C) (06/04 0431) Pulse Rate:  [80-88] 80 (06/04 0431) Resp:  [18] 18 (06/04 0431) BP: (122-148)/(54-71) 145/71 (06/04 0431) SpO2:  [97 %-99 %] 99 % (06/04 0431) Last BM Date: 07/15/16 (ostomy)  Intake/Output from previous day: 06/03 0701 - 06/04 0700 In: -  Out: 3070 [Urine:1950; Drains:70; Stool:1050] Intake/Output this shift: Total I/O In: -  Out: 350 [Urine:350]  PE: Gen:  Alert, NAD, pleasant Pulm:  Normal effort Abd: Soft, non-tender, non-distended, VAC in place and c/d/i, JP drain in place with serous output  Drain: 70 cc/24 h SS, mostly serous Skin: warm and dry, no rashes  Psych: A&Ox3   Lab Results:   Recent Labs  07/15/16 0441 07/16/16 0357  WBC 9.7 11.7*  HGB 8.0* 7.6*  HCT 24.4* 23.5*  PLT 364 369   BMET  Recent Labs  07/15/16 0441 07/16/16 0357  NA 140 139  K 3.6 3.3*  CL 107 108  CO2 27 24  GLUCOSE 97 91  BUN <5* <5*  CREATININE 0.82 0.79  CALCIUM 7.7* 7.7*   PT/INR No results for input(s): LABPROT, INR in the last 72 hours. CMP     Component Value Date/Time   NA 139 07/16/2016 0357   K 3.3 (L) 07/16/2016 0357   CL 108 07/16/2016 0357   CO2 24 07/16/2016 0357   GLUCOSE 91 07/16/2016 0357   BUN <5 (L) 07/16/2016 0357   CREATININE 0.79 07/16/2016 0357   CALCIUM 7.7 (L) 07/16/2016 0357   PROT 6.9 07/05/2016 1843   ALBUMIN 3.5 07/05/2016 1843   AST 17 07/05/2016 1843   ALT 14 07/05/2016 1843   ALKPHOS 61 07/05/2016 1843   BILITOT 0.8 07/05/2016 1843   GFRNONAA >60 07/16/2016 0357   GFRAA >60 07/16/2016 0357   Lipase  No results found for:  LIPASE     Studies/Results: No results found.  Anti-infectives: Anti-infectives    Start     Dose/Rate Route Frequency Ordered Stop   07/10/16 2000  piperacillin-tazobactam (ZOSYN) IVPB 3.375 g  Status:  Discontinued     3.375 g 12.5 mL/hr over 240 Minutes Intravenous Every 8 hours 07/10/16 1810 07/15/16 0726   07/10/16 1153  cefoTEtan in Dextrose 5% (CEFOTAN) 2-2.08 GM-% IVPB    Comments:  Georgena Spurling   : cabinet override      07/10/16 1153 07/10/16 2359   07/10/16 0600  cefoTEtan (CEFOTAN) 2 g in dextrose 5 % 50 mL IVPB     2 g 100 mL/hr over 30 Minutes Intravenous On call to O.R. 07/09/16 1125 07/10/16 1325   07/09/16 1400  neomycin (MYCIFRADIN) tablet 1,000 mg     1,000 mg Oral 3 times per day 07/09/16 1125 07/09/16 2255   07/09/16 1400  metroNIDAZOLE (FLAGYL) tablet 1,000 mg     1,000 mg Oral 3 times per day 07/09/16 1125 07/09/16 2255      Assessment/Plan s/p Procedure(s): POD#6 LAPAROSCOPY converted to open, sigmoid colon resection with colostomy, SMALL BOWEL RESECTION repair of small bowel x 2, 07/10/16 Dr. Dalbert Batman Doing well without apparent complication. No evidence of infection and no role for continued abx from surgical perspective. Stable for discharge from  a surgical standpoint. Will require [place] RN for colostomy care and wound VAC changes TID (M/W/F). D/C JP prior to discharge.   Follow up w/ Dr. Dalbert Batman in 3-4 weeks.    LOS: 11 days    Jill Alexanders , [facility] 07/16/2016, 7:43 AM Pager: [phone] Consults: [phone] Mon-Fri 7:00 am-4:30 pm Sat-Sun 7:00 am-11:30 am

## 2016-07-16 NOTE — Progress Notes (Signed)
Having trouble getting the wound to stop leaking air. Took it down to reinforce it and it still is showing a leak.  Called PA and orders were given to take it down and do a wet to dry and cancel the discharge if unable to stop the leak. D/C was cancelled and pt was ok with that.

## 2016-07-16 NOTE — Progress Notes (Signed)
Physical Therapy Treatment Patient Details Name: [name] MRN: [id number] DOB: February 21, 1955 Today's Date: 07/16/2016    History of Present Illness Pt is a 61 year old female s/p sigmoid colectomy with colostomy, small bowel resection, takedown splenic flexure    PT Comments    Pt progressing well with mobility, anxious regarding potential D/C   Follow Up Recommendations  No PT follow up;Supervision for mobility/OOB     Equipment Recommendations  None recommended by PT    Recommendations for Other Services       Precautions / Restrictions Precautions Precautions: Fall Restrictions Weight Bearing Restrictions: No    Mobility  Bed Mobility Overal bed mobility: Needs Assistance Bed Mobility: Supine to Sit     Supine to sit: Supervision     General bed mobility comments: cuesf or technique, HOB elevated as pt sleeps in recliner at home  Transfers Overall transfer level: Needs assistance Equipment used: Rolling walker (2 wheeled) Transfers: Sit to/from Stand Sit to Stand: Min guard;Supervision         General transfer comment: verbal cues for hand placement  Ambulation/Gait Ambulation/Gait assistance: Supervision Ambulation Distance (Feet): 200 Feet Assistive device: Rolling walker (2 wheeled) Gait Pattern/deviations: Step-through pattern;Decreased stride length;Trunk flexed     General Gait Details: verbal cues for trunk extension and step length   Stairs            Wheelchair Mobility    Modified Rankin (Stroke Patients Only)       Balance                                            Cognition Arousal/Alertness: Awake/alert Behavior During Therapy: WFL for tasks assessed/performed Overall Cognitive Status: Within Functional Limits for tasks assessed                                        Exercises      General Comments        Pertinent Vitals/Pain Pain Assessment: 0-10 Pain Score: 3  Pain  Location: abdomen Pain Descriptors / Indicators: Sore Pain Intervention(s): Limited activity within patient's tolerance;Monitored during session;Premedicated before session    Home Living                      Prior Function            PT Goals (current goals can now be found in the care plan section) Acute Rehab PT Goals PT Goal Formulation: With patient Time For Goal Achievement: 07/20/16 Potential to Achieve Goals: Good Progress towards PT goals: Progressing toward goals    Frequency    Min 3X/week      PT Plan Current plan remains appropriate    Co-evaluation              AM-PAC PT "6 Clicks" Daily Activity  Outcome Measure  Difficulty turning over in bed (including adjusting bedclothes, sheets and blankets)?: None Difficulty moving from lying on back to sitting on the side of the bed? : A Little Difficulty sitting down on and standing up from a chair with arms (e.g., wheelchair, bedside commode, etc,.)?: A Little Help needed moving to and from a bed to chair (including a wheelchair)?: A Little Help needed walking in hospital room?: A Little Help needed  climbing 3-5 steps with a railing? : A Little 6 Click Score: 19    End of Session   Activity Tolerance: Patient tolerated treatment well Patient left: in chair;with call bell/phone within reach   PT Visit Diagnosis: Other abnormalities of gait and mobility (R26.89)     Time: 0623-7628 PT Time Calculation (min) (ACUTE ONLY): 18 min  Charges:  $Gait Training: 8-22 mins                    G CodesKenyon Ana, PT Pager: [phone] 07/16/2016    Kenyon Ana 07/16/2016, 11:35 AM

## 2016-07-16 NOTE — Progress Notes (Signed)
Patient ID: MARELIN TAT, female   DOB: Jan 12, 1956, 61 y.o.   MRN: [id number]  PROGRESS NOTE    ENIYAH EASTMOND  TJQ:[id number] DOB: 07/01/1955 DOA: 07/05/2016 PCP: Patient, No Pcp Per   Brief Narrative:  61 y.o.femalewith medical history significant of osteoarthritis, mild dyspnea on exertion, hyperlipidemia, hypertension, hypothyroidism, morbid obesity, prediabetes, Schamberg disease who [facility] ED after presenting there with 4-6 weeks of recurrent abdominal pain, on and off nausea/vomiting and diarrhea. She was found to have a large bowel obstruction possibly secondary to sigmoid colon mass. General surgery and GI has been called for consult. Patient has an NG tube in place.She had flexible sigmoidoscopy with stent placement on 07/06/2016 by Dr. Benson Norway. She is planned for surgery today on 07/10/2016.  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  Postop anemia:  hgb 10.5-9.8-8.3-7.5-8.3-8.0, close monitor, no indication for transfusion  Hypokalemia/hypomagnesium --replace prn, keep K.4, mag >2,   Hypothyroidism --d/c iv synthroid, Resume oral levothyroxine now that patient is started on clears.  Hypertension --Hold lisinopril --bp improved after hydration, now off hydration, continue hold home bp meds,    Pre-diabetes -a1c 5.8 --CBG range from 82-101  Chronic bilateral Lower extremities edema. --Echo shows normal LV function with grade 1 diastolic dysfunction -off ivf,  monitor volume status, consider resume home meds demadex at discharge   DVT prophylaxis:SCDs.  Code Status:Full code. Family Communication:none at bedside Disposition Plan:likely home with home health on 6/5 with general surgery clearance Consultants:GI and general surgery  Procedure:  Sigmoidoscopy on 07/06/2016 which showed sigmoid colon stricture status post stent placement sigmoid colectomy with colostomy, SBR, takedown splenic flexure on 5/29  Echo: On 07/06/2016:Study Conclusions  - Left ventricle: The cavity size was normal. Wall thickness was increased in a pattern of mild LVH. Systolic function was normal. The estimated ejection fraction was in the range of 60% to 65%. Wall motion was normal; there were no regional wall motion abnormalities. Doppler parameters are consistent with abnormal left ventricular relaxation (grade 1 diastolic dysfunction). - Mitral valve: Calcified annulus. Mildly thickened leaflets .  Impressions:  - Normal LV systolic function; mild diastolic dysfunction; mild LVH.  Antimicrobials:zosyn  Subjective: denies pain, no fever,  drain /colostomy, wound vac in place. bilateral pedal edema has improved  Objective: Vitals:   07/15/16 1400 07/15/16 2100 07/16/16 0431 07/16/16 1441  BP: (!) 122/54 (!) 148/65 (!) 145/71 (!) 143/65  Pulse: 88 85 80 83  Resp: 18 18 18 16   Temp: 97.6 F (36.4 C) 98.1 F (36.7 C) 97.8 F (36.6 C) 98 F (36.7 C)  TempSrc: Oral Oral Oral Oral  SpO2: 97% 99% 99% 99%  Weight:      Height:        Intake/Output Summary (Last 24 hours) at 07/16/16 1845 Last data filed at 07/16/16 1035  Gross per 24 hour  Intake  0 ml  Output             1570 ml  Net            -1570 ml   Filed Weights    07/10/16 1136 07/11/16 0525 07/12/16 0605  Weight: 98.7 kg (217 lb 9.5 oz) 103.2 kg (227 lb 8.2 oz) 102.8 kg (226 lb 10.1 oz)    Examination:  General exam: look tired  Respiratory system: Bilateral decreased breath sound at bases Cardiovascular system: S1 & S2 heard,Rate controlled  Gastrointestinal system: post op changes, +colostomy, +drain. + wound vac, Normal bowel sounds heard. Central nervous system: Alert and oriented. No focal neurological deficits. Moving extremities Extremities: chronic venous stasis changes .  bilateral pedal edema improved, No cyanosis  Data Reviewed: I have personally reviewed following labs and imaging studies  CBC:  Recent Labs Lab 07/10/16 0455  07/12/16 0338 07/13/16 0422 07/14/16 0857 07/15/16 0441 07/16/16 0357  WBC 10.2  < > 14.6* 10.2 9.3 9.7 11.7*  NEUTROABS 6.9  --   --   --   --  6.3 7.6  HGB 11.6*  < > 8.3* 7.5* 8.3* 8.0* 7.6*  HCT 35.8*  < > 24.7* 22.6* 25.1* 24.4* 23.5*  MCV 84.4  < > 84.9 85.3 83.4 84.4 85.8  PLT 271  < > 238 245 357 364 369  < > = values in this interval not displayed. Basic Metabolic Panel:  Recent Labs Lab 07/12/16 0338 07/13/16 0422 07/14/16 0857 07/15/16 0441 07/16/16 0357  NA 136 137 138 140 139  K 3.9 3.3* 3.8 3.6 3.3*  CL 108 108 108 107 108  CO2 23 24 22 27 24   GLUCOSE 92 82 101* 97 91  BUN 9 7 <5* <5* <5*  CREATININE 0.88 0.78 0.74 0.82 0.79  CALCIUM 7.3* 7.3* 7.7* 7.7* 7.7*  MG 1.8 1.8 1.8 1.8 1.8   GFR: Estimated Creatinine Clearance: 84 mL/min (by C-G formula based on SCr of 0.79 mg/dL). Liver Function Tests: No results for input(s): AST, ALT, ALKPHOS, BILITOT, PROT, ALBUMIN in the last 168 hours. No results for input(s): LIPASE, AMYLASE in the last 168 hours. No results for input(s): AMMONIA in the last 168 hours. Coagulation Profile: No results for input(s): INR, PROTIME in the last 168 hours. Cardiac Enzymes: No results for input(s): CKTOTAL, CKMB, CKMBINDEX, TROPONINI in the  last 168 hours. BNP (last 3 results) No results for input(s): PROBNP in the last 8760 hours. HbA1C: No results for input(s): HGBA1C in the last 72 hours. CBG:  Recent Labs Lab 07/10/16 1647  GLUCAP 147*   Lipid Profile: No results for input(s): CHOL, HDL, LDLCALC, TRIG, CHOLHDL, LDLDIRECT in the last 72 hours. Thyroid Function Tests: No results for input(s): TSH, T4TOTAL, FREET4, T3FREE, THYROIDAB in the last 72 hours. Anemia Panel: No results for input(s): VITAMINB12, FOLATE, FERRITIN, TIBC, IRON, RETICCTPCT in the last 72 hours. Sepsis Labs:  Recent Labs Lab 07/11/16 1213  LATICACIDVEN 1.3    Recent Results (from the past 240 hour(s))  MRSA PCR Screening     Status: None   Collection Time: 07/12/16  7:51 AM  Result Value Ref Range Status   MRSA by PCR NEGATIVE NEGATIVE Final    Comment:        The GeneXpert MRSA Assay (FDA approved for NASAL specimens only), is one component of a comprehensive MRSA colonization surveillance program. It is not intended to diagnose MRSA infection nor to guide or monitor treatment for MRSA infections.  Radiology Studies: No results found.      Scheduled Meds: . chlorhexidine  60 mL Topical Once  . Chlorhexidine Gluconate Cloth  6 each Topical Once  . enoxaparin (LOVENOX) injection  40 mg Subcutaneous Q24H  . levothyroxine  112 mcg Oral QAC breakfast  . pantoprazole (PROTONIX) IV  40 mg Intravenous QHS   Continuous Infusions: . sodium chloride    . sodium chloride       LOS: 11 days      Time spent> 21mins  Riaz Onorato, MD PhD [facility] Pager [phone]  If 7PM-7AM, please contact night-coverage [email or web address] Password [facility] 07/16/2016, 6:45 PM

## 2016-07-16 NOTE — Consult Note (Signed)
[place] Nurse wound follow up  Stoma type/location: LUQ colostomy created emergently on 07/10/16 by Dr. Zenaida Niece assessment/size: Oval ostomy measuring 2 and 1/2 inches from 3-6 o'clock and 1 and 3/4 inches from 12-6 o'clock. Deep crease from 6-12 o'clock (medial edge) Peristomal assessment: intact, with deep crease from 6-12'oclock Treatment options for stomal/peristomal skin: 1.5 skin barrier rings, one circumferentially, one-half from 6-12 o'clock in crease Output: small amount of brown liquid effluent, no flatus. Ostomy pouching: 1pc.convex flexible ostomy pouch with 1.5 skin barrier rings Kellie Simmering [phone] Education provided: Father in room, but limited participation. Patient states she has female family members familiar with ostomy care.  Discussed a situation with a leak that occurs in the middle of the night, etc. And that she needs to master self care.   Patient lives with her father. 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 Some challenges are anticipated with self care at this time due to stoma location and body contour in the parastomal plane. Enrolled patient in [place] program: Yes today Discharging today [place] Nurse wound consult note Reason for Consult: Change NPWT dressing.  Discharging home with Medela foam system.   Wound type:Surgical midline incision Pressure Injury POA: N/A Measurement: 8 cm x 3 cm x 1.8 cm  Wound QPR:FFMB, granulating.  Encouraged to eat protein and healthy food choices to promote healing Drainage (amount, consistency, odor) Minimal serosanguinous   No odor Periwound:intact Dressing procedure/placement/frequency: 1 piece black foam applied.  Connected to Medela NPWT system.  Change Mon/WEd/Fri Will not follow at this time.  Please re-consult if needed.  Domenic Moras RN BSN [place] Pager [phone]

## 2016-07-16 NOTE — Discharge Summary (Addendum)
Discharge Summary  [name] SHF:[id number] DOB: 1955/07/22  PCP: [name], MD  Admit date: 07/05/2016 Discharge date: 07/17/2016  Time spent: >85mins  Recommendations for Outpatient Follow-up:  1. F/u with PMD within a week  for hospital discharge follow up, repeat cbc/bmp at follow up 2. F/u with general surgery in three weeks 3. Home health RN arranged for wound vac/colostomy care  Discharge Diagnoses:  Active Hospital Problems   Diagnosis Date Noted  . 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 Colostomy in place [facility]) 07/14/2016  . Hypokalemia 07/06/2016  . Lower extremities edema 07/06/2016  . SBO (small bowel obstruction) ([place]) 07/05/2016  . 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 Hypothyroidism 07/05/2016  . Hypertension 07/05/2016  . Pre-diabetes 07/05/2016    Resolved Hospital Problems   Diagnosis Date Noted Date Resolved  . Status post laparoscopic-assisted sigmoidectomy 07/10/2016 07/14/2016    Discharge Condition: stable  Diet recommendation: heart healthy  Filed Weights   07/10/16 1136 07/11/16 0525 07/12/16 0605  Weight: 98.7 kg (217 lb 9.5 oz) 103.2 kg (227 lb 8.2 oz) 102.8 kg (226 lb 10.1 oz)    History of present illness:  Chief Complaint: Abdominal pain.  HPI: [name] is a 61 y.o. female with medical history significant of osteoarthritis, mild dyspnea on exertion, hyperlipidemia, hypertension, hypothyroidism, morbid obesity, prediabetes, Schamberg disease who is coming from [facility] ED after presenting there with 4-6 weeks of recurrent abdominal pain, on and off nausea/vomiting and diarrhea.  Per patient, she woke up around 0 400 with nausea and has vomited about 4 times since then. She has had 3-4 loose stool BM as well. For the past few weeks, she states that she has been alternating diarrhea with constipation. She denies melena or hematochezia, but states that her stools are thinner than usual.  She also complains of fatigue, malaise, decreased appetite and weight loss of several pounds. She denies fever, chills, headache, chest pain, productive cough, dyspnea, palpitations, diaphoresis or dizziness. She denies dysuria or frequency. Her sleep has been disturbed due to the symptoms.  ED Course at [facility]: The patient was given IV fluids, analgesics and antiemetics. Workup there showed WBC of 16.9 with 87.6% neutrophils, hemoglobin 13.5 and platelets of 299. WBC at this facility 6.2. Sodium 146, potassium 4.0, chloride 109 and bicarbonate 22 mmol/L. BUN was 13, creatinine 0.9, calcium 9.1 and glucose 115 mg/dL. Lipase level was 61.  Imaging: CT abdomen/pelvis with contrast shows distal colonic obstruction of proximal sigmoid colon, malignancy is suspected. No metastases seen. Please see full report for further detail.  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 Planned discharge was cancelled on 6/4 due to malfunction of wound vac and leaking colostomy  , issue addressed, she is discharged on 6/5   Postop anemia:  hgb 10.5-9.8-8.3-7.5-8.3-8.0, close  monitor, no indication for transfusion  Hypokalemia/hypomagnesium --replace prn, keep K.4, mag >2,    Hypothyroidism --she recieved iv synthroid while npo,  oral levothyroxine resumed once patient is started on diet.  Hypertension --Hold lisinopril/demadex  Held in the hospital, resumed at discharge.  Pre-diabetes -a1c 5.8 --CBG range from 82-101  Chronic bilateral Lower extremities edema. --Echo shows normal LV function with grade 1 diastolic dysfunction --she received ivf while in the hospital, demadex held while in the hospital, demadex resumed on 6/4, she has some mild pedal edema at discharge, no ankle or lower leg edema   DVT prophylaxis:SCDs.  Code Status:Full code. Family Communication:none at bedside Disposition Plan:home with home health on 6/5 with general surgery clearance Consultants:GI and general surgery  Procedure:  Sigmoidoscopy on 07/06/2016 which showed sigmoid colon stricture status post stent placement sigmoid colectomy with colostomy, SBR, takedown splenic flexure on 5/29  Echo: On 07/06/2016:Study Conclusions  - Left ventricle: The cavity size was normal. Wall thickness was increased in a pattern of mild LVH. Systolic function was normal. The estimated ejection fraction was in the range of 60% to 65%. Wall motion was normal; there were no regional wall motion abnormalities. Doppler parameters are consistent with abnormal left ventricular relaxation (grade 1 diastolic dysfunction). - Mitral valve: Calcified annulus. Mildly thickened leaflets .  Impressions:  - Normal LV systolic function; mild diastolic dysfunction; mild LVH.  Antimicrobials:zosyn   Discharge Exam: BP (!) 146/64 (BP Location: Right Arm)   Pulse 73   Temp 98.6 F (37 C) (Oral)   Resp 18   Ht 5\' 4"  (1.626 m)   Wt 102.8 kg (226 lb 10.1 oz)   SpO2 97%   BMI 38.90 kg/m   General exam: look tired  Respiratory system: Bilateral decreased breath sound at bases Cardiovascular system: S1 & S2 heard,Rate controlled  Gastrointestinal system:  post op changes, +colostomy, + wound vac, Normal bowel sounds heard. Central nervous system: Alert and oriented. No focal neurological deficits. Moving extremities Extremities: chronic venous stasis changes .  bilateral pedal edema improved, No cyanosis   Discharge Instructions You were cared for by a hospitalist during your hospital stay. If you have any questions about your discharge medications or the care you received while you were in the hospital after you are discharged, you can call the unit and asked to speak with the hospitalist on call if the hospitalist that took care of you is not available. Once you are discharged, your primary care physician will handle any further medical issues. Please note that NO REFILLS for any discharge medications will be authorized once you are discharged, as it is imperative that you return to your primary care physician (or establish a relationship with a primary care physician if you do not have one) for your aftercare needs so that they can reassess your need for medications and monitor your lab values.  Discharge Instructions    AMB Referral to [facility]    Complete by:  As directed    Please assign UMR member for post discharge call. Likely discharge from [place] today 07/16/16. Please call with questions. Marthenia Rolling, [place], [facility] [phone]   Reason for consult:  Please assign UMR member for post discharge call   Expected date of contact:  1-3 days (reserved for hospital discharges)   Diet - low sodium heart healthy    Complete by:  As directed    Diet - low sodium heart healthy    Complete by:  As directed    Face-to-face encounter (required for Medicare/Medicaid patients)    Complete by:  As directed    I Armando Lauman certify that this patient is under my care and that I, or a nurse practitioner or physician's assistant working with me, had a face-to-face encounter that meets the physician  face-to-face encounter requirements with this patient on 07/16/2016. The encounter with the patient was in whole, or in part for the following medical condition(s) which is the primary reason for home health care (List medical condition): FTT Home health RN for colostomy care and wound vac care per general surgery instruction.   The encounter with the patient was in whole, or in part, for the following medical condition, which is the primary reason for home health care:  FTT   I certify that, based on my findings, the following services are medically necessary home health services:  Nursing   Reason for Medically Necessary Home Health Services:  Skilled Nursing- Change/Decline in Patient Status   My clinical findings support the need for the above services:  Pain interferes with ambulation/mobility   Further, I certify that my clinical findings support that this patient is homebound due to:  Pain interferes with ambulation/mobility   Home Health    Complete by:  As directed    To provide the following care/treatments:  RN   Increase activity slowly    Complete by:  As directed    Increase activity slowly    Complete by:  As directed      Allergies as of 07/17/2016   No Known Allergies     Medication List    TAKE these medications   acetaminophen 325 MG tablet Commonly known as:  TYLENOL DO NOT TAKE MORE THAN 4000 MG OF TYLENOL PER DAY.  IT CAN HARM YOUR LIVER.  TYLENOL (ACETAMINOPHEN) IS ALSO IN YOUR PRESCRIPTION PAIN MEDICATION.  YOU HAVE TO COUNT IT IN YOUR DAILY TOTAL.  If you get up to 10 tablets per day you are close to your maximum dose.   CALCIUM 600+D 600-400 MG-UNIT tablet Generic drug:  Calcium Carbonate-Vitamin D Take 1 tablet by mouth daily.   levothyroxine 112 MCG tablet Commonly known as:  SYNTHROID, LEVOTHROID Take 112 mcg by mouth daily before breakfast.   lisinopril 20 MG tablet Commonly known as:  PRINIVIL,ZESTRIL Take 20 mg by mouth daily.   Magnesium Oxide 400  (240 Mg) MG Tabs Take 1 tablet (400 mg total) by mouth daily.   oxyCODONE-acetaminophen 5-325 MG tablet Commonly known as:  PERCOCET/ROXICET Take 1-2 tablets by mouth every 4 (four) hours as needed for moderate pain.   potassium chloride SA 20 MEQ tablet Commonly known as:  K-DUR,KLOR-CON Take 20 mEq by mouth daily as needed (when taking Torsemide).   simvastatin 20 MG tablet Commonly known as:  ZOCOR Take 20 mg by mouth at bedtime.   torsemide 20 MG tablet Commonly known as:  DEMADEX Take 20 mg by mouth daily as needed (for edema).      No Known Allergies Follow-up Information    Fanny Skates, MD. Schedule an appointment as soon as possible for a visit in 3 week(s).   Specialty:  General Surgery Why:  for post-operative follow up Contact information: [place] [address] [phone]        follow up with primary care physician in one week,repeat cbc/bmp at follow up. Follow up.        [place]  Follow up.   Why:  negative pressure wound vac Contact information: [address] [place] [address] [phone]        [facility] Follow up.   Specialty:  [place] Why:  nurse to assist with wound care Contact information: [address] [place] [address] [phone]            The results of significant diagnostics from this hospitalization (including imaging, microbiology, ancillary and laboratory) are listed below for reference.    Significant Diagnostic Studies: Dg Abd 1 View  Result Date: 07/06/2016 CLINICAL DATA:  Intraoperative fluoroscopy.  Sigmoid obstruction. EXAM: ABDOMEN - 1 VIEW COMPARISON:  Abdominal CT from yesterday FINDINGS: Fluoroscopy shows wire and then stent placement over the pelvis correlating with history of sigmoid stenting. Diffuse gas dilated colon. IMPRESSION: Fluoroscopy for sigmoid stenting. Electronically Signed   By: Monte Fantasia  M.D.   On: 07/06/2016 13:27   Dg Abd Portable 1v  Result Date: 07/06/2016 CLINICAL DATA:  NG tube placement EXAM: PORTABLE ABDOMEN - 1 VIEW COMPARISON:  CT abdomen and pelvis 07/05/2016.  Abdomen 07/05/2016. FINDINGS: An enteric tube is been placed with tip in the midline upper abdomen, likely in the body of the stomach. Diffuse gaseous distention of the colon consistent with known distal colonic obstruction. IMPRESSION: Enteric tube tip is in mid upper abdomen, likely in the body of the stomach. Electronically Signed   By: Lucienne Capers M.D.   On: 07/06/2016 00:46   Dg C-arm 1-60 Min-no Report  Result Date: 07/06/2016 Fluoroscopy was utilized by the requesting physician.  No radiographic interpretation.    Microbiology: Recent Results (from the past 240 hour(s))  MRSA PCR Screening     Status: None   Collection Time: 07/12/16  7:51 AM  Result Value Ref Range Status   MRSA by PCR NEGATIVE NEGATIVE Final    Comment:        The GeneXpert MRSA Assay (FDA approved for NASAL specimens only), is one component of a comprehensive MRSA colonization surveillance program. It is not intended to diagnose MRSA infection nor to guide or monitor treatment for MRSA infections.      Labs: Basic Metabolic Panel:  Recent Labs Lab 07/13/16 0422 07/14/16 0857 07/15/16 0441 07/16/16 0357 07/17/16 0504  NA 137 138 140 139 139  K 3.3* 3.8 3.6 3.3* 3.5  CL 108 108 107 108 107  CO2 24 22 27 24 27   GLUCOSE 82 101* 97 91 94  BUN 7 <5* <5* <5* <5*  CREATININE 0.78 0.74 0.82 0.79 0.71  CALCIUM 7.3* 7.7* 7.7* 7.7* 8.1*  MG 1.8 1.8 1.8 1.8 1.9   Liver Function Tests: No results for input(s): AST, ALT, ALKPHOS, BILITOT, PROT, ALBUMIN in the last 168 hours. No results for input(s): LIPASE, AMYLASE in the last 168 hours. No results for input(s): AMMONIA in the last 168 hours. CBC:  Recent Labs Lab 07/13/16 0422 07/14/16 0857 07/15/16 0441 07/16/16 0357 07/17/16 0504  WBC 10.2 9.3 9.7  11.7* 12.7*  NEUTROABS  --   --  6.3 7.6 9.0*  HGB 7.5* 8.3* 8.0* 7.6* 8.5*  HCT 22.6* 25.1* 24.4* 23.5* 26.3*  MCV 85.3 83.4 84.4 85.8 86.2  PLT 245 357 364 369 405*   Cardiac Enzymes: No results for input(s): CKTOTAL, CKMB, CKMBINDEX, TROPONINI in the last 168 hours. BNP: BNP (last 3 results) No results for input(s): BNP in the last 8760 hours.  ProBNP (last 3 results) No results for input(s): PROBNP  in the last 8760 hours.  CBG:  Recent Labs Lab 07/10/16 1647  GLUCAP 147*       SignedFlorencia Reasons MD, PhD  [facility] 07/17/2016, 11:37 AM

## 2016-07-17 LAB — CBC WITH DIFFERENTIAL/PLATELET
Basophils Absolute: 0 10*3/uL (ref 0.0–0.1)
Basophils Relative: 0 %
EOS PCT: 4 %
Eosinophils Absolute: 0.5 10*3/uL (ref 0.0–0.7)
HEMATOCRIT: 26.3 % — AB (ref 36.0–46.0)
Hemoglobin: 8.5 g/dL — ABNORMAL LOW (ref 12.0–15.0)
LYMPHS ABS: 2.3 10*3/uL (ref 0.7–4.0)
Lymphocytes Relative: 18 %
MCH: 27.9 pg (ref 26.0–34.0)
MCHC: 32.3 g/dL (ref 30.0–36.0)
MCV: 86.2 fL (ref 78.0–100.0)
MONO ABS: 0.9 10*3/uL (ref 0.1–1.0)
Monocytes Relative: 7 %
Neutro Abs: 9 10*3/uL — ABNORMAL HIGH (ref 1.7–7.7)
Neutrophils Relative %: 71 %
Platelets: 405 10*3/uL — ABNORMAL HIGH (ref 150–400)
RBC: 3.05 MIL/uL — AB (ref 3.87–5.11)
RDW: 15.1 % (ref 11.5–15.5)
WBC: 12.7 10*3/uL — AB (ref 4.0–10.5)

## 2016-07-17 LAB — BASIC METABOLIC PANEL
Anion gap: 5 (ref 5–15)
BUN: <5 mg/dL — ABNORMAL LOW (ref 6–20)
CALCIUM: 8.1 mg/dL — AB (ref 8.9–10.3)
CO2: 27 mmol/L (ref 22–32)
CREATININE: 0.71 mg/dL (ref 0.44–1.00)
Chloride: 107 mmol/L (ref 101–111)
GFR calc non Af Amer: >60 mL/min (ref >60)
GLUCOSE: 94 mg/dL (ref 65–99)
Potassium: 3.5 mmol/L (ref 3.5–5.1)
Sodium: 139 mmol/L (ref 135–145)

## 2016-07-17 LAB — MAGNESIUM: Magnesium: 1.9 mg/dL (ref 1.7–2.4)

## 2016-07-17 MED FILL — OXYCODONE W/APAP 5/325 TAB: 5-325 | 4 days supply | Qty: 40 | Fill #0

## 2016-07-17 MED FILL — MAGNESIUM OXIDE 400 MG TAB: 400 | 30 days supply | Qty: 30 | Fill #0

## 2016-07-17 NOTE — Consult Note (Addendum)
   [facility] Inpatient Consult   07/17/2016  [name] 08/31/1955 [id number]     [facility] Care Management/Link to Wellness bedside follow up. Spoke with [name] at bedside last week on behalf Link to Wellness program for [facility] employees/dependents. She was a little drowsy at that time.  Went to speak with [name] at bedside today. Discussed potential needs for Link to Wellness as she endorses she is pre-diabetic. Provided Link to [employer] and contact information.   Also called [employer] on her behalf. She left another message for the [employer]/ [employer]ML[employer] representative for a return call. [name] expresses frustration in not being able to reach her [employer] agent.   Confirmed Primary Care MD as Dr. [name].  [name] states she will return home with her father and sister are at home to assist post discharge. She is going home with a wound vac and will have home health services thru [facility].   Reminded [name] that she will receive post hospital discharge call. Expressed appreciation of visit.  Discussed above with inpatient RNCM. Ms Selleck to discharge today.   Marthenia Rolling, MSN-Ed, RN,BSN [facility] Liaison [phone]

## 2016-07-17 NOTE — Progress Notes (Signed)
Nutrition Follow-up  DOCUMENTATION CODES:   Obesity unspecified  INTERVENTION:  - Continue to encourage PO intakes. - RD will monitor for needs if pt unable to d/c today.   NUTRITION DIAGNOSIS:   Inadequate oral intake related to altered GI function as evidenced by NPO status. -diet advanced; resolved; no new nutrition dx at this time.   GOAL:   Patient will meet greater than or equal to 90% of their needs -likely met on average.   MONITOR:   PO intake, Weight trends, Labs, Skin, I & O's  ASSESSMENT:    61 y.o. female with medical history significant of osteoarthritis, mild dyspnea on exertion, hyperlipidemia, hypertension, hypothyroidism, morbid obesity, prediabetes, Schamberg disease who is coming from [facility] ED after presenting there with 4-6 weeks of recurrent abdominal pain, on and off nausea/vomiting and diarrhea. CT scan there demonstrated a large bowel obstruction reportedly due to a sigmoid colon mass.   6/5 Pt was to d/c home yesterday but had issues with wound vac/wound vac changing. Plan to d/c home today. Pt with increased appetite and eating well now; 100% of breakfast consumed. No new weight since 5/31.  Medications reviewed; 112 mcg oral Synthroid/day, 40 mg oral Protonix/day.  Labs reviewed; BUN: <5 mg/dL, Ca: 8.1 mg/dL.   5/30 - Pt on CLD 5/27 at 0815 until 0001 on 5/29.  - She consumed 100% of breakfast and lunch on 5/28.  - She is now POD #1 sigmoid colectomy and colostomy (lap converted to open), small bowel resection with repair of small bowel x2, takedown of splenic flexure.  - Estimated kcal need updated based on this recent procedure.  - Weight +8 lbs since admission so estimated needs remain based on admission weight (99.4 kg).  - Slow sheet indicates small amount of brown liquid stool via colostomy today at 0800.   Nutrition-focused physical assessment shows no muscle and no fat wasting at this time but will continue to monitor for indications of  malnutrition.   IVF: NS @ 125 mL/hr.    5/25 - RD unable to speak with pt today as pt was in procedure at time of visit.  - Pt currently NPO; awaiting GI evaluation.  - Patient might need sigmoidoscopy/colonoscopy.  - Pt with NGT in place.  - Pt with hypokalemia; monitor and supplement as needed per MD discretion.  - Unable to complete Nutrition-Focused physical exam at this time.    Diet Order:  Diet regular Room service appropriate? Yes; Fluid consistency: Thin Diet - low sodium heart healthy  Skin:  Reviewed, no issues  Last BM:  6/4  Height:   Ht Readings from Last 1 Encounters:  07/10/16 _0  (1.626 m)    Weight:   Wt Readings from Last 1 Encounters:  07/12/16 226 lb 10.1 oz (102.8 kg)    Ideal Body Weight:  54.5 kg  BMI:  Body mass index is 38.9 kg/m.  Estimated Nutritional Needs:   Kcal:  1790-1990 (18-20 kcal/kg)  Protein:  100-110g/day   Fluid:  >1.5L/day   EDUCATION NEEDS:   Education needs no appropriate at this time    Jarome Matin, MS, RD, LDN, CNSC Inpatient Clinical Dietitian Pager # [phone] After hours/weekend pager # [phone]

## 2016-07-17 NOTE — Discharge Instructions (Signed)
PLEASE ENCOURAGE YOUR HOME HEALTH CARE AGENCY TO CALL OUR OFFICE WITH ANY WOUND CONCERNS THEY MAY HAVE WHEN THEY COME OUT TO YOUR HOUSE TO EVALUATE YOUR WOUND AND CHANGE YOUR WOUND VAC SPONGE. OFFICE NUMBER IS BELOW.  [facility], [place] [phone]  OPEN ABDOMINAL SURGERY: POST OP INSTRUCTIONS  Always review your discharge instruction sheet given to you by the facility where your surgery was performed.  IF YOU HAVE DISABILITY OR FAMILY LEAVE FORMS, YOU MUST BRING THEM TO THE OFFICE FOR PROCESSING.  PLEASE DO NOT GIVE THEM TO YOUR DOCTOR.  1. A prescription for pain medication may be given to you upon discharge.  Take your pain medication as prescribed, if needed.  If narcotic pain medicine is not needed, then you may take acetaminophen (Tylenol) or ibuprofen (Advil) as needed. 2. Take your usually prescribed medications unless otherwise directed. 3. If you need a refill on your pain medication, please contact your pharmacy. They will contact our office to request authorization.  Prescriptions will not be filled after 5pm or on week-ends. 4. You should follow a light diet the first few days after arrival home, such as soup and crackers, pudding, etc.unless your doctor has advised otherwise. A high-fiber, low fat diet can be resumed as tolerated.   Be sure to include lots of fluids daily. Most patients will experience some swelling and bruising on the chest and neck area.  Ice packs will help.  Swelling and bruising can take several days to resolve 5. Most patients will experience some swelling and bruising in the area of the incision. Ice pack will help. Swelling and bruising can take several days to resolve..  6. It is common to experience some constipation if taking pain medication after surgery.  Increasing fluid intake and taking a stool softener will usually help or prevent this problem from occurring.  A mild laxative (Milk of Magnesia or Miralax) should be taken according to  package directions if there are no bowel movements after 48 hours. 7.  You may have steri-strips (small skin tapes) in place directly over the incision.  These strips should be left on the skin for 7-10 days.  If your surgeon used skin glue on the incision, you may shower in 24 hours.  The glue will flake off over the next 2-3 weeks.  Any sutures or staples will be removed at the office during your follow-up visit. You may find that a light gauze bandage over your incision may keep your staples from being rubbed or pulled. You may shower and replace the bandage daily. 8. ACTIVITIES:  You may resume regular (light) daily activities beginning the next day--such as daily self-care, walking, climbing stairs--gradually increasing activities as tolerated.  You may have sexual intercourse when it is comfortable.  Refrain from any heavy lifting or straining until approved by your doctor. a. You may drive when you no longer are taking prescription pain medication, you can comfortably wear a seatbelt, and you can safely maneuver your car and apply brakes b. Return to Work: ___________________________________ 57. You should see your doctor in the office for a follow-up appointment approximately two weeks after your surgery.  Make sure that you call for this appointment within a day or two after you arrive home to insure a convenient appointment time. OTHER INSTRUCTIONS:  _____________________________________________________________ _____________________________________________________________  WHEN TO CALL YOUR DOCTOR: 1. Fever over 101.0 2. Inability to urinate 3. Nausea and/or vomiting 4. Extreme swelling or bruising 5. Continued  bleeding from incision. 6. Increased pain, redness, or drainage from the incision. 7. Difficulty swallowing or breathing 8. Muscle cramping or spasms. 9. Numbness or tingling in hands or feet or around lips.  The clinic staff is available to answer your questions during regular  business hours.  Please dont hesitate to call and ask to speak to one of the nurses if you have concerns.  For further questions, please visit [email or web address]

## 2016-07-17 NOTE — Progress Notes (Signed)
[facility] Progress Note  7 Days Post-Op  Subjective: CC: s/p abdominal surgery No complaints. Tolerating diet. Pain controlled. Denies fever, chills, nausea, or vomiting. Having bowel function.  Issues with wound vac change prevented patient from being discharged yesterday. Home VAC placed around 10 PM. Patient reports that she has now looked at her colostomy pouch and is trying to learn.  Objective: Vital signs in last 24 hours: Temp:  [98 F (36.7 C)-98.6 F (37 C)] 98.6 F (37 C) (06/05 0425) Pulse Rate:  [73-83] 73 (06/05 0425) Resp:  [16-18] 18 (06/05 0425) BP: (143-157)/(64-67) 146/64 (06/05 0425) SpO2:  [97 %-99 %] 97 % (06/05 0425) Last BM Date: 07/16/16  Intake/Output from previous day: 06/04 0701 - 06/05 0700 In: 240 [P.O.:240] Out: 1160 [Urine:710; Stool:450] Intake/Output this shift: No intake/output data recorded.  PE: Gen:  Alert, NAD, pleasant Pulm:  Normal effort Abd: Soft, non-tender, non-distended, VAC in place and c/d/i, JP drain in place with serous output Skin: warm and dry, no rashes  Psych: A&Ox3   Lab Results:   Recent Labs  07/16/16 0357 07/17/16 0504  WBC 11.7* 12.7*  HGB 7.6* 8.5*  HCT 23.5* 26.3*  PLT 369 405*   BMET  Recent Labs  07/16/16 0357 07/17/16 0504  NA 139 139  K 3.3* 3.5  CL 108 107  CO2 24 27  GLUCOSE 91 94  BUN <5* <5*  CREATININE 0.79 0.71  CALCIUM 7.7* 8.1*   PT/INR No results for input(s): LABPROT, INR in the last 72 hours. CMP     Component Value Date/Time   NA 139 07/17/2016 0504   K 3.5 07/17/2016 0504   CL 107 07/17/2016 0504   CO2 27 07/17/2016 0504   GLUCOSE 94 07/17/2016 0504   BUN <5 (L) 07/17/2016 0504   CREATININE 0.71 07/17/2016 0504   CALCIUM 8.1 (L) 07/17/2016 0504   PROT 6.9 07/05/2016 1843   ALBUMIN 3.5 07/05/2016 1843   AST 17 07/05/2016 1843   ALT 14 07/05/2016 1843   ALKPHOS 61 07/05/2016 1843   BILITOT 0.8 07/05/2016 1843   GFRNONAA >60 07/17/2016 0504   GFRAA  >60 07/17/2016 0504   Lipase  No results found for: LIPASE     Studies/Results: No results found.  Anti-infectives: Anti-infectives    Start     Dose/Rate Route Frequency Ordered Stop   07/10/16 2000  piperacillin-tazobactam (ZOSYN) IVPB 3.375 g  Status:  Discontinued     3.375 g 12.5 mL/hr over 240 Minutes Intravenous Every 8 hours 07/10/16 1810 07/15/16 0726   07/10/16 1153  cefoTEtan in Dextrose 5% (CEFOTAN) 2-2.08 GM-% IVPB    Comments:  Georgena Spurling   : cabinet override      07/10/16 1153 07/10/16 2359   07/10/16 0600  cefoTEtan (CEFOTAN) 2 g in dextrose 5 % 50 mL IVPB     2 g 100 mL/hr over 30 Minutes Intravenous On call to O.R. 07/09/16 1125 07/10/16 1325   07/09/16 1400  neomycin (MYCIFRADIN) tablet 1,000 mg     1,000 mg Oral 3 times per day 07/09/16 1125 07/09/16 2255   07/09/16 1400  metroNIDAZOLE (FLAGYL) tablet 1,000 mg     1,000 mg Oral 3 times per day 07/09/16 1125 07/09/16 2255       Assessment/Plan s/p Procedure(s): POD#7 LAPAROSCOPY converted to open, sigmoid colon resection with colostomy, SMALL BOWEL RESECTION repair of small bowel x 2, 07/10/16 Dr. Dalbert Batman Doing well without apparent complication. No evidence of infection and no role  for continued abx from surgical perspective. Stable for discharge from a surgical standpoint. Will require [facility] RN for colostomy care and wound VAC changes three times weekly (M/W/F). VAC changes to start at home TOMORROW Wednesday 6/6. Call our office with any concerns regarding midline wound.  Follow up w/ Dr. Dalbert Batman in 3-4 weeks.    LOS: 12 days    Jill Alexanders , [facility] 07/17/2016, 7:32 AM Pager: [phone] Consults: [phone] Mon-Fri 7:00 am-4:30 pm Sat-Sun 7:00 am-11:30 am

## 2016-07-18 DIAGNOSIS — Z933 Colostomy status: Secondary | ICD-10-CM | POA: Diagnosis not present

## 2016-07-18 DIAGNOSIS — K219 Gastro-esophageal reflux disease without esophagitis: Secondary | ICD-10-CM | POA: Diagnosis not present

## 2016-07-18 DIAGNOSIS — I10 Essential (primary) hypertension: Secondary | ICD-10-CM | POA: Diagnosis not present

## 2016-07-18 DIAGNOSIS — Z48815 Encounter for surgical aftercare following surgery on the digestive system: Secondary | ICD-10-CM | POA: Diagnosis not present

## 2016-07-18 DIAGNOSIS — K5792 Diverticulitis of intestine, part unspecified, without perforation or abscess without bleeding: Secondary | ICD-10-CM | POA: Diagnosis not present

## 2016-07-18 DIAGNOSIS — R600 Localized edema: Secondary | ICD-10-CM | POA: Diagnosis not present

## 2016-07-18 DIAGNOSIS — T8189XA Other complications of procedures, not elsewhere classified, initial encounter: Secondary | ICD-10-CM | POA: Diagnosis not present

## 2016-07-18 DIAGNOSIS — E039 Hypothyroidism, unspecified: Secondary | ICD-10-CM | POA: Diagnosis not present

## 2016-07-18 DIAGNOSIS — K639 Disease of intestine, unspecified: Secondary | ICD-10-CM | POA: Diagnosis not present

## 2016-07-18 DIAGNOSIS — E785 Hyperlipidemia, unspecified: Secondary | ICD-10-CM | POA: Diagnosis not present

## 2016-07-18 DIAGNOSIS — K6389 Other specified diseases of intestine: Secondary | ICD-10-CM | POA: Diagnosis not present

## 2016-07-18 DIAGNOSIS — R7303 Prediabetes: Secondary | ICD-10-CM | POA: Diagnosis not present

## 2016-07-19 DIAGNOSIS — Z4801 Encounter for change or removal of surgical wound dressing: Secondary | ICD-10-CM | POA: Diagnosis not present

## 2016-07-19 DIAGNOSIS — K5732 Diverticulitis of large intestine without perforation or abscess without bleeding: Secondary | ICD-10-CM | POA: Diagnosis not present

## 2016-07-19 DIAGNOSIS — Z933 Colostomy status: Secondary | ICD-10-CM | POA: Diagnosis not present

## 2016-07-20 ENCOUNTER — Other Ambulatory Visit: Payer: Self-pay | Admitting: *Deleted

## 2016-07-20 ENCOUNTER — Encounter: Payer: Self-pay | Admitting: *Deleted

## 2016-07-20 DIAGNOSIS — K5792 Diverticulitis of intestine, part unspecified, without perforation or abscess without bleeding: Secondary | ICD-10-CM | POA: Diagnosis not present

## 2016-07-20 DIAGNOSIS — R7303 Prediabetes: Secondary | ICD-10-CM | POA: Diagnosis not present

## 2016-07-20 DIAGNOSIS — K219 Gastro-esophageal reflux disease without esophagitis: Secondary | ICD-10-CM | POA: Diagnosis not present

## 2016-07-20 DIAGNOSIS — K6389 Other specified diseases of intestine: Secondary | ICD-10-CM | POA: Diagnosis not present

## 2016-07-20 DIAGNOSIS — Z933 Colostomy status: Secondary | ICD-10-CM | POA: Diagnosis not present

## 2016-07-20 DIAGNOSIS — Z48815 Encounter for surgical aftercare following surgery on the digestive system: Secondary | ICD-10-CM | POA: Diagnosis not present

## 2016-07-20 DIAGNOSIS — E785 Hyperlipidemia, unspecified: Secondary | ICD-10-CM | POA: Diagnosis not present

## 2016-07-20 DIAGNOSIS — E039 Hypothyroidism, unspecified: Secondary | ICD-10-CM | POA: Diagnosis not present

## 2016-07-20 DIAGNOSIS — I10 Essential (primary) hypertension: Secondary | ICD-10-CM | POA: Diagnosis not present

## 2016-07-20 NOTE — Patient Outreach (Addendum)
[facility]) [name]  07/20/2016  [name] 08-26-1955 [id number]  Subjective: Telephone call to patient's home number, no answer, message states disconnected or changed number, and unable to leave a message.  Telephone call to patient's emergency contact mobile number (father [name]), spoke with patient, and HIPAA verified.  Discussed [name] Transition of [name] follow up, patient voiced understanding, and is in agreement to follow up.   Patient states she is doing a lot better, has received wound vac supplies, home health nurse has been out for visits, has scheduled hospital follow up visit with primary MD on 07/24/16, scheduled hospital follow up visit with surgeon on 08/09/16, started claims process with family medical leave act (FMLA), has filled hospital indemnity supplemental insurance claim, and has contacted vendor regarding short term disability.  States she had excellent [name] at the hospital [name]), has a supportive family, supportive friends, and great support system. Patient states she does not have any transition of [name], [name] coordination, disease management, disease monitoring, transportation, community resource, or pharmacy needs at this time.   States she is very appreciative of the follow up and is in agreement to receive [name] information.     Objective: Per chart review, patient hospitalized 07/06/26 - 07/17/16 for Diverticulitis with obstruction.  Status post Laparoscopy, laparotomy, Takedown splenic flexure, Sigmoid colectomy with colostomy formation [name], Small bowel resection X 1, and Enterorrhaphy X 2 on 07/10/16.  Patient also has a history of prediabetes, hypertension,  and hypothyroidism.    Assessment: Received [facility] Transition of [name] referral on 07/16/16.  Transition of [name] follow up completed, no [name] management needs, and will proceed with case closure.    Plan: RNCM will send  patient successful outreach letter, [name], and magnet. RNCM will send case closure due to follow up completed / no [name] management needs request to [name] at [facility].    [name] Annia Friendly, BSN, [facility] Telephonic CM Phone: [phone] Fax: [phone]

## 2016-07-22 DIAGNOSIS — I10 Essential (primary) hypertension: Secondary | ICD-10-CM | POA: Diagnosis not present

## 2016-07-22 DIAGNOSIS — Z933 Colostomy status: Secondary | ICD-10-CM | POA: Diagnosis not present

## 2016-07-22 DIAGNOSIS — K5792 Diverticulitis of intestine, part unspecified, without perforation or abscess without bleeding: Secondary | ICD-10-CM | POA: Diagnosis not present

## 2016-07-22 DIAGNOSIS — K6389 Other specified diseases of intestine: Secondary | ICD-10-CM | POA: Diagnosis not present

## 2016-07-22 DIAGNOSIS — R7303 Prediabetes: Secondary | ICD-10-CM | POA: Diagnosis not present

## 2016-07-22 DIAGNOSIS — K219 Gastro-esophageal reflux disease without esophagitis: Secondary | ICD-10-CM | POA: Diagnosis not present

## 2016-07-22 DIAGNOSIS — E785 Hyperlipidemia, unspecified: Secondary | ICD-10-CM | POA: Diagnosis not present

## 2016-07-22 DIAGNOSIS — Z48815 Encounter for surgical aftercare following surgery on the digestive system: Secondary | ICD-10-CM | POA: Diagnosis not present

## 2016-07-22 DIAGNOSIS — E039 Hypothyroidism, unspecified: Secondary | ICD-10-CM | POA: Diagnosis not present

## 2016-07-23 DIAGNOSIS — K6389 Other specified diseases of intestine: Secondary | ICD-10-CM | POA: Diagnosis not present

## 2016-07-23 DIAGNOSIS — Z933 Colostomy status: Secondary | ICD-10-CM | POA: Diagnosis not present

## 2016-07-23 DIAGNOSIS — K5792 Diverticulitis of intestine, part unspecified, without perforation or abscess without bleeding: Secondary | ICD-10-CM | POA: Diagnosis not present

## 2016-07-23 DIAGNOSIS — E785 Hyperlipidemia, unspecified: Secondary | ICD-10-CM | POA: Diagnosis not present

## 2016-07-23 DIAGNOSIS — K219 Gastro-esophageal reflux disease without esophagitis: Secondary | ICD-10-CM | POA: Diagnosis not present

## 2016-07-23 DIAGNOSIS — R7303 Prediabetes: Secondary | ICD-10-CM | POA: Diagnosis not present

## 2016-07-23 DIAGNOSIS — Z48815 Encounter for surgical aftercare following surgery on the digestive system: Secondary | ICD-10-CM | POA: Diagnosis not present

## 2016-07-23 DIAGNOSIS — I10 Essential (primary) hypertension: Secondary | ICD-10-CM | POA: Diagnosis not present

## 2016-07-23 DIAGNOSIS — E039 Hypothyroidism, unspecified: Secondary | ICD-10-CM | POA: Diagnosis not present

## 2016-07-24 DIAGNOSIS — D649 Anemia, unspecified: Secondary | ICD-10-CM | POA: Diagnosis not present

## 2016-07-24 DIAGNOSIS — E876 Hypokalemia: Secondary | ICD-10-CM | POA: Diagnosis not present

## 2016-07-25 DIAGNOSIS — Z48815 Encounter for surgical aftercare following surgery on the digestive system: Secondary | ICD-10-CM | POA: Diagnosis not present

## 2016-07-25 DIAGNOSIS — K6389 Other specified diseases of intestine: Secondary | ICD-10-CM | POA: Diagnosis not present

## 2016-07-25 DIAGNOSIS — E039 Hypothyroidism, unspecified: Secondary | ICD-10-CM | POA: Diagnosis not present

## 2016-07-25 DIAGNOSIS — K219 Gastro-esophageal reflux disease without esophagitis: Secondary | ICD-10-CM | POA: Diagnosis not present

## 2016-07-25 DIAGNOSIS — E785 Hyperlipidemia, unspecified: Secondary | ICD-10-CM | POA: Diagnosis not present

## 2016-07-25 DIAGNOSIS — I10 Essential (primary) hypertension: Secondary | ICD-10-CM | POA: Diagnosis not present

## 2016-07-25 DIAGNOSIS — K5792 Diverticulitis of intestine, part unspecified, without perforation or abscess without bleeding: Secondary | ICD-10-CM | POA: Diagnosis not present

## 2016-07-25 DIAGNOSIS — Z933 Colostomy status: Secondary | ICD-10-CM | POA: Diagnosis not present

## 2016-07-25 DIAGNOSIS — R7303 Prediabetes: Secondary | ICD-10-CM | POA: Diagnosis not present

## 2016-07-26 DIAGNOSIS — E039 Hypothyroidism, unspecified: Secondary | ICD-10-CM | POA: Diagnosis not present

## 2016-07-26 DIAGNOSIS — Z48815 Encounter for surgical aftercare following surgery on the digestive system: Secondary | ICD-10-CM | POA: Diagnosis not present

## 2016-07-26 DIAGNOSIS — I10 Essential (primary) hypertension: Secondary | ICD-10-CM | POA: Diagnosis not present

## 2016-07-26 DIAGNOSIS — E785 Hyperlipidemia, unspecified: Secondary | ICD-10-CM | POA: Diagnosis not present

## 2016-07-26 DIAGNOSIS — R7303 Prediabetes: Secondary | ICD-10-CM | POA: Diagnosis not present

## 2016-07-26 DIAGNOSIS — Z933 Colostomy status: Secondary | ICD-10-CM | POA: Diagnosis not present

## 2016-07-26 DIAGNOSIS — K6389 Other specified diseases of intestine: Secondary | ICD-10-CM | POA: Diagnosis not present

## 2016-07-26 DIAGNOSIS — K219 Gastro-esophageal reflux disease without esophagitis: Secondary | ICD-10-CM | POA: Diagnosis not present

## 2016-07-26 DIAGNOSIS — K5792 Diverticulitis of intestine, part unspecified, without perforation or abscess without bleeding: Secondary | ICD-10-CM | POA: Diagnosis not present

## 2016-07-27 DIAGNOSIS — Z4801 Encounter for change or removal of surgical wound dressing: Secondary | ICD-10-CM | POA: Diagnosis not present

## 2016-07-27 DIAGNOSIS — K5732 Diverticulitis of large intestine without perforation or abscess without bleeding: Secondary | ICD-10-CM | POA: Diagnosis not present

## 2016-07-27 DIAGNOSIS — K5792 Diverticulitis of intestine, part unspecified, without perforation or abscess without bleeding: Secondary | ICD-10-CM | POA: Diagnosis not present

## 2016-07-27 DIAGNOSIS — I10 Essential (primary) hypertension: Secondary | ICD-10-CM | POA: Diagnosis not present

## 2016-07-27 DIAGNOSIS — K219 Gastro-esophageal reflux disease without esophagitis: Secondary | ICD-10-CM | POA: Diagnosis not present

## 2016-07-27 DIAGNOSIS — R7303 Prediabetes: Secondary | ICD-10-CM | POA: Diagnosis not present

## 2016-07-27 DIAGNOSIS — Z933 Colostomy status: Secondary | ICD-10-CM | POA: Diagnosis not present

## 2016-07-27 DIAGNOSIS — Z48815 Encounter for surgical aftercare following surgery on the digestive system: Secondary | ICD-10-CM | POA: Diagnosis not present

## 2016-07-27 DIAGNOSIS — K6389 Other specified diseases of intestine: Secondary | ICD-10-CM | POA: Diagnosis not present

## 2016-07-27 DIAGNOSIS — E785 Hyperlipidemia, unspecified: Secondary | ICD-10-CM | POA: Diagnosis not present

## 2016-07-27 DIAGNOSIS — E039 Hypothyroidism, unspecified: Secondary | ICD-10-CM | POA: Diagnosis not present

## 2016-07-28 DIAGNOSIS — E039 Hypothyroidism, unspecified: Secondary | ICD-10-CM | POA: Diagnosis not present

## 2016-07-28 DIAGNOSIS — K219 Gastro-esophageal reflux disease without esophagitis: Secondary | ICD-10-CM | POA: Diagnosis not present

## 2016-07-28 DIAGNOSIS — K5792 Diverticulitis of intestine, part unspecified, without perforation or abscess without bleeding: Secondary | ICD-10-CM | POA: Diagnosis not present

## 2016-07-28 DIAGNOSIS — E785 Hyperlipidemia, unspecified: Secondary | ICD-10-CM | POA: Diagnosis not present

## 2016-07-28 DIAGNOSIS — I10 Essential (primary) hypertension: Secondary | ICD-10-CM | POA: Diagnosis not present

## 2016-07-28 DIAGNOSIS — K6389 Other specified diseases of intestine: Secondary | ICD-10-CM | POA: Diagnosis not present

## 2016-07-28 DIAGNOSIS — Z933 Colostomy status: Secondary | ICD-10-CM | POA: Diagnosis not present

## 2016-07-28 DIAGNOSIS — Z48815 Encounter for surgical aftercare following surgery on the digestive system: Secondary | ICD-10-CM | POA: Diagnosis not present

## 2016-07-28 DIAGNOSIS — R7303 Prediabetes: Secondary | ICD-10-CM | POA: Diagnosis not present

## 2016-07-29 DIAGNOSIS — K5792 Diverticulitis of intestine, part unspecified, without perforation or abscess without bleeding: Secondary | ICD-10-CM | POA: Diagnosis not present

## 2016-07-29 DIAGNOSIS — E039 Hypothyroidism, unspecified: Secondary | ICD-10-CM | POA: Diagnosis not present

## 2016-07-29 DIAGNOSIS — R7303 Prediabetes: Secondary | ICD-10-CM | POA: Diagnosis not present

## 2016-07-29 DIAGNOSIS — Z48815 Encounter for surgical aftercare following surgery on the digestive system: Secondary | ICD-10-CM | POA: Diagnosis not present

## 2016-07-29 DIAGNOSIS — K219 Gastro-esophageal reflux disease without esophagitis: Secondary | ICD-10-CM | POA: Diagnosis not present

## 2016-07-29 DIAGNOSIS — K6389 Other specified diseases of intestine: Secondary | ICD-10-CM | POA: Diagnosis not present

## 2016-07-29 DIAGNOSIS — Z933 Colostomy status: Secondary | ICD-10-CM | POA: Diagnosis not present

## 2016-07-29 DIAGNOSIS — E785 Hyperlipidemia, unspecified: Secondary | ICD-10-CM | POA: Diagnosis not present

## 2016-07-29 DIAGNOSIS — I10 Essential (primary) hypertension: Secondary | ICD-10-CM | POA: Diagnosis not present

## 2016-07-30 DIAGNOSIS — E785 Hyperlipidemia, unspecified: Secondary | ICD-10-CM | POA: Diagnosis not present

## 2016-07-30 DIAGNOSIS — Z4801 Encounter for change or removal of surgical wound dressing: Secondary | ICD-10-CM | POA: Diagnosis not present

## 2016-07-30 DIAGNOSIS — K5732 Diverticulitis of large intestine without perforation or abscess without bleeding: Secondary | ICD-10-CM | POA: Diagnosis not present

## 2016-07-30 DIAGNOSIS — I10 Essential (primary) hypertension: Secondary | ICD-10-CM | POA: Diagnosis not present

## 2016-07-30 DIAGNOSIS — K6389 Other specified diseases of intestine: Secondary | ICD-10-CM | POA: Diagnosis not present

## 2016-07-30 DIAGNOSIS — K5792 Diverticulitis of intestine, part unspecified, without perforation or abscess without bleeding: Secondary | ICD-10-CM | POA: Diagnosis not present

## 2016-07-30 DIAGNOSIS — K219 Gastro-esophageal reflux disease without esophagitis: Secondary | ICD-10-CM | POA: Diagnosis not present

## 2016-07-30 DIAGNOSIS — Z933 Colostomy status: Secondary | ICD-10-CM | POA: Diagnosis not present

## 2016-07-30 DIAGNOSIS — E039 Hypothyroidism, unspecified: Secondary | ICD-10-CM | POA: Diagnosis not present

## 2016-07-30 DIAGNOSIS — Z48815 Encounter for surgical aftercare following surgery on the digestive system: Secondary | ICD-10-CM | POA: Diagnosis not present

## 2016-07-30 DIAGNOSIS — R7303 Prediabetes: Secondary | ICD-10-CM | POA: Diagnosis not present

## 2016-08-01 DIAGNOSIS — K6389 Other specified diseases of intestine: Secondary | ICD-10-CM | POA: Diagnosis not present

## 2016-08-01 DIAGNOSIS — E039 Hypothyroidism, unspecified: Secondary | ICD-10-CM | POA: Diagnosis not present

## 2016-08-01 DIAGNOSIS — K219 Gastro-esophageal reflux disease without esophagitis: Secondary | ICD-10-CM | POA: Diagnosis not present

## 2016-08-01 DIAGNOSIS — E785 Hyperlipidemia, unspecified: Secondary | ICD-10-CM | POA: Diagnosis not present

## 2016-08-01 DIAGNOSIS — I10 Essential (primary) hypertension: Secondary | ICD-10-CM | POA: Diagnosis not present

## 2016-08-01 DIAGNOSIS — K5792 Diverticulitis of intestine, part unspecified, without perforation or abscess without bleeding: Secondary | ICD-10-CM | POA: Diagnosis not present

## 2016-08-01 DIAGNOSIS — R7303 Prediabetes: Secondary | ICD-10-CM | POA: Diagnosis not present

## 2016-08-01 DIAGNOSIS — Z48815 Encounter for surgical aftercare following surgery on the digestive system: Secondary | ICD-10-CM | POA: Diagnosis not present

## 2016-08-01 DIAGNOSIS — Z933 Colostomy status: Secondary | ICD-10-CM | POA: Diagnosis not present

## 2016-08-03 DIAGNOSIS — Z933 Colostomy status: Secondary | ICD-10-CM | POA: Diagnosis not present

## 2016-08-03 DIAGNOSIS — E785 Hyperlipidemia, unspecified: Secondary | ICD-10-CM | POA: Diagnosis not present

## 2016-08-03 DIAGNOSIS — Z48815 Encounter for surgical aftercare following surgery on the digestive system: Secondary | ICD-10-CM | POA: Diagnosis not present

## 2016-08-03 DIAGNOSIS — K5792 Diverticulitis of intestine, part unspecified, without perforation or abscess without bleeding: Secondary | ICD-10-CM | POA: Diagnosis not present

## 2016-08-03 DIAGNOSIS — K219 Gastro-esophageal reflux disease without esophagitis: Secondary | ICD-10-CM | POA: Diagnosis not present

## 2016-08-03 DIAGNOSIS — I10 Essential (primary) hypertension: Secondary | ICD-10-CM | POA: Diagnosis not present

## 2016-08-03 DIAGNOSIS — E039 Hypothyroidism, unspecified: Secondary | ICD-10-CM | POA: Diagnosis not present

## 2016-08-03 DIAGNOSIS — R7303 Prediabetes: Secondary | ICD-10-CM | POA: Diagnosis not present

## 2016-08-03 DIAGNOSIS — K6389 Other specified diseases of intestine: Secondary | ICD-10-CM | POA: Diagnosis not present

## 2016-08-06 DIAGNOSIS — Z933 Colostomy status: Secondary | ICD-10-CM | POA: Diagnosis not present

## 2016-08-06 DIAGNOSIS — K219 Gastro-esophageal reflux disease without esophagitis: Secondary | ICD-10-CM | POA: Diagnosis not present

## 2016-08-06 DIAGNOSIS — K6389 Other specified diseases of intestine: Secondary | ICD-10-CM | POA: Diagnosis not present

## 2016-08-06 DIAGNOSIS — E785 Hyperlipidemia, unspecified: Secondary | ICD-10-CM | POA: Diagnosis not present

## 2016-08-06 DIAGNOSIS — K5792 Diverticulitis of intestine, part unspecified, without perforation or abscess without bleeding: Secondary | ICD-10-CM | POA: Diagnosis not present

## 2016-08-06 DIAGNOSIS — E039 Hypothyroidism, unspecified: Secondary | ICD-10-CM | POA: Diagnosis not present

## 2016-08-06 DIAGNOSIS — I10 Essential (primary) hypertension: Secondary | ICD-10-CM | POA: Diagnosis not present

## 2016-08-06 DIAGNOSIS — R7303 Prediabetes: Secondary | ICD-10-CM | POA: Diagnosis not present

## 2016-08-06 DIAGNOSIS — Z48815 Encounter for surgical aftercare following surgery on the digestive system: Secondary | ICD-10-CM | POA: Diagnosis not present

## 2016-08-08 DIAGNOSIS — R7303 Prediabetes: Secondary | ICD-10-CM | POA: Diagnosis not present

## 2016-08-08 DIAGNOSIS — K219 Gastro-esophageal reflux disease without esophagitis: Secondary | ICD-10-CM | POA: Diagnosis not present

## 2016-08-08 DIAGNOSIS — K6389 Other specified diseases of intestine: Secondary | ICD-10-CM | POA: Diagnosis not present

## 2016-08-08 DIAGNOSIS — K5792 Diverticulitis of intestine, part unspecified, without perforation or abscess without bleeding: Secondary | ICD-10-CM | POA: Diagnosis not present

## 2016-08-08 DIAGNOSIS — E785 Hyperlipidemia, unspecified: Secondary | ICD-10-CM | POA: Diagnosis not present

## 2016-08-08 DIAGNOSIS — E039 Hypothyroidism, unspecified: Secondary | ICD-10-CM | POA: Diagnosis not present

## 2016-08-08 DIAGNOSIS — Z48815 Encounter for surgical aftercare following surgery on the digestive system: Secondary | ICD-10-CM | POA: Diagnosis not present

## 2016-08-08 DIAGNOSIS — I10 Essential (primary) hypertension: Secondary | ICD-10-CM | POA: Diagnosis not present

## 2016-08-08 DIAGNOSIS — Z933 Colostomy status: Secondary | ICD-10-CM | POA: Diagnosis not present

## 2016-08-10 DIAGNOSIS — I10 Essential (primary) hypertension: Secondary | ICD-10-CM | POA: Diagnosis not present

## 2016-08-10 DIAGNOSIS — E039 Hypothyroidism, unspecified: Secondary | ICD-10-CM | POA: Diagnosis not present

## 2016-08-10 DIAGNOSIS — K5792 Diverticulitis of intestine, part unspecified, without perforation or abscess without bleeding: Secondary | ICD-10-CM | POA: Diagnosis not present

## 2016-08-10 DIAGNOSIS — Z4801 Encounter for change or removal of surgical wound dressing: Secondary | ICD-10-CM | POA: Diagnosis not present

## 2016-08-10 DIAGNOSIS — K5732 Diverticulitis of large intestine without perforation or abscess without bleeding: Secondary | ICD-10-CM | POA: Diagnosis not present

## 2016-08-10 DIAGNOSIS — K219 Gastro-esophageal reflux disease without esophagitis: Secondary | ICD-10-CM | POA: Diagnosis not present

## 2016-08-10 DIAGNOSIS — Z933 Colostomy status: Secondary | ICD-10-CM | POA: Diagnosis not present

## 2016-08-10 DIAGNOSIS — K6389 Other specified diseases of intestine: Secondary | ICD-10-CM | POA: Diagnosis not present

## 2016-08-10 DIAGNOSIS — Z48815 Encounter for surgical aftercare following surgery on the digestive system: Secondary | ICD-10-CM | POA: Diagnosis not present

## 2016-08-10 DIAGNOSIS — R7303 Prediabetes: Secondary | ICD-10-CM | POA: Diagnosis not present

## 2016-08-10 DIAGNOSIS — E785 Hyperlipidemia, unspecified: Secondary | ICD-10-CM | POA: Diagnosis not present

## 2016-08-13 DIAGNOSIS — I10 Essential (primary) hypertension: Secondary | ICD-10-CM | POA: Diagnosis not present

## 2016-08-13 DIAGNOSIS — E785 Hyperlipidemia, unspecified: Secondary | ICD-10-CM | POA: Diagnosis not present

## 2016-08-13 DIAGNOSIS — R7303 Prediabetes: Secondary | ICD-10-CM | POA: Diagnosis not present

## 2016-08-13 DIAGNOSIS — K5792 Diverticulitis of intestine, part unspecified, without perforation or abscess without bleeding: Secondary | ICD-10-CM | POA: Diagnosis not present

## 2016-08-13 DIAGNOSIS — Z4801 Encounter for change or removal of surgical wound dressing: Secondary | ICD-10-CM | POA: Diagnosis not present

## 2016-08-13 DIAGNOSIS — Z48815 Encounter for surgical aftercare following surgery on the digestive system: Secondary | ICD-10-CM | POA: Diagnosis not present

## 2016-08-13 DIAGNOSIS — E039 Hypothyroidism, unspecified: Secondary | ICD-10-CM | POA: Diagnosis not present

## 2016-08-13 DIAGNOSIS — K6389 Other specified diseases of intestine: Secondary | ICD-10-CM | POA: Diagnosis not present

## 2016-08-13 DIAGNOSIS — Z933 Colostomy status: Secondary | ICD-10-CM | POA: Diagnosis not present

## 2016-08-13 DIAGNOSIS — K5732 Diverticulitis of large intestine without perforation or abscess without bleeding: Secondary | ICD-10-CM | POA: Diagnosis not present

## 2016-08-13 DIAGNOSIS — K219 Gastro-esophageal reflux disease without esophagitis: Secondary | ICD-10-CM | POA: Diagnosis not present

## 2016-08-16 DIAGNOSIS — I10 Essential (primary) hypertension: Secondary | ICD-10-CM | POA: Diagnosis not present

## 2016-08-16 DIAGNOSIS — D649 Anemia, unspecified: Secondary | ICD-10-CM | POA: Diagnosis not present

## 2016-08-16 DIAGNOSIS — K56699 Other intestinal obstruction unspecified as to partial versus complete obstruction: Secondary | ICD-10-CM | POA: Diagnosis not present

## 2016-08-16 DIAGNOSIS — K6389 Other specified diseases of intestine: Secondary | ICD-10-CM | POA: Diagnosis not present

## 2016-08-17 DIAGNOSIS — R7303 Prediabetes: Secondary | ICD-10-CM | POA: Diagnosis not present

## 2016-08-17 DIAGNOSIS — K5792 Diverticulitis of intestine, part unspecified, without perforation or abscess without bleeding: Secondary | ICD-10-CM | POA: Diagnosis not present

## 2016-08-17 DIAGNOSIS — E039 Hypothyroidism, unspecified: Secondary | ICD-10-CM | POA: Diagnosis not present

## 2016-08-17 DIAGNOSIS — Z933 Colostomy status: Secondary | ICD-10-CM | POA: Diagnosis not present

## 2016-08-17 DIAGNOSIS — K219 Gastro-esophageal reflux disease without esophagitis: Secondary | ICD-10-CM | POA: Diagnosis not present

## 2016-08-17 DIAGNOSIS — K6389 Other specified diseases of intestine: Secondary | ICD-10-CM | POA: Diagnosis not present

## 2016-08-17 DIAGNOSIS — I10 Essential (primary) hypertension: Secondary | ICD-10-CM | POA: Diagnosis not present

## 2016-08-17 DIAGNOSIS — Z48815 Encounter for surgical aftercare following surgery on the digestive system: Secondary | ICD-10-CM | POA: Diagnosis not present

## 2016-08-17 DIAGNOSIS — E785 Hyperlipidemia, unspecified: Secondary | ICD-10-CM | POA: Diagnosis not present

## 2016-08-20 DIAGNOSIS — T8131XA Disruption of external operation (surgical) wound, not elsewhere classified, initial encounter: Secondary | ICD-10-CM | POA: Diagnosis not present

## 2016-08-20 DIAGNOSIS — K6389 Other specified diseases of intestine: Secondary | ICD-10-CM | POA: Diagnosis not present

## 2016-08-20 DIAGNOSIS — E039 Hypothyroidism, unspecified: Secondary | ICD-10-CM | POA: Diagnosis not present

## 2016-08-20 DIAGNOSIS — K219 Gastro-esophageal reflux disease without esophagitis: Secondary | ICD-10-CM | POA: Diagnosis not present

## 2016-08-20 DIAGNOSIS — Z933 Colostomy status: Secondary | ICD-10-CM | POA: Diagnosis not present

## 2016-08-20 DIAGNOSIS — R7303 Prediabetes: Secondary | ICD-10-CM | POA: Diagnosis not present

## 2016-08-20 DIAGNOSIS — Z48815 Encounter for surgical aftercare following surgery on the digestive system: Secondary | ICD-10-CM | POA: Diagnosis not present

## 2016-08-20 DIAGNOSIS — E785 Hyperlipidemia, unspecified: Secondary | ICD-10-CM | POA: Diagnosis not present

## 2016-08-20 DIAGNOSIS — K5732 Diverticulitis of large intestine without perforation or abscess without bleeding: Secondary | ICD-10-CM | POA: Diagnosis not present

## 2016-08-20 DIAGNOSIS — K5792 Diverticulitis of intestine, part unspecified, without perforation or abscess without bleeding: Secondary | ICD-10-CM | POA: Diagnosis not present

## 2016-08-20 DIAGNOSIS — I10 Essential (primary) hypertension: Secondary | ICD-10-CM | POA: Diagnosis not present

## 2016-08-20 DIAGNOSIS — Z4801 Encounter for change or removal of surgical wound dressing: Secondary | ICD-10-CM | POA: Diagnosis not present

## 2016-08-20 MED FILL — SIMVASTATIN 20 MG TABLET: 20 | 90 days supply | Qty: 90 | Fill #1

## 2016-08-21 MED FILL — LEVOTHYROXINE 112 MCG TAB: 112 | 30 days supply | Qty: 30 | Fill #0

## 2016-08-22 DIAGNOSIS — R7303 Prediabetes: Secondary | ICD-10-CM | POA: Diagnosis not present

## 2016-08-22 DIAGNOSIS — K219 Gastro-esophageal reflux disease without esophagitis: Secondary | ICD-10-CM | POA: Diagnosis not present

## 2016-08-22 DIAGNOSIS — K6389 Other specified diseases of intestine: Secondary | ICD-10-CM | POA: Diagnosis not present

## 2016-08-22 DIAGNOSIS — E039 Hypothyroidism, unspecified: Secondary | ICD-10-CM | POA: Diagnosis not present

## 2016-08-22 DIAGNOSIS — Z933 Colostomy status: Secondary | ICD-10-CM | POA: Diagnosis not present

## 2016-08-22 DIAGNOSIS — Z48815 Encounter for surgical aftercare following surgery on the digestive system: Secondary | ICD-10-CM | POA: Diagnosis not present

## 2016-08-22 DIAGNOSIS — E785 Hyperlipidemia, unspecified: Secondary | ICD-10-CM | POA: Diagnosis not present

## 2016-08-22 DIAGNOSIS — I10 Essential (primary) hypertension: Secondary | ICD-10-CM | POA: Diagnosis not present

## 2016-08-22 DIAGNOSIS — K5792 Diverticulitis of intestine, part unspecified, without perforation or abscess without bleeding: Secondary | ICD-10-CM | POA: Diagnosis not present

## 2016-08-22 MED FILL — LISINOPRIL 20 MG TABLET: 20 | 90 days supply | Qty: 90 | Fill #0

## 2016-08-23 DIAGNOSIS — Z933 Colostomy status: Secondary | ICD-10-CM | POA: Diagnosis not present

## 2016-08-27 DIAGNOSIS — E039 Hypothyroidism, unspecified: Secondary | ICD-10-CM | POA: Diagnosis not present

## 2016-08-27 DIAGNOSIS — R7303 Prediabetes: Secondary | ICD-10-CM | POA: Diagnosis not present

## 2016-08-27 DIAGNOSIS — Z48815 Encounter for surgical aftercare following surgery on the digestive system: Secondary | ICD-10-CM | POA: Diagnosis not present

## 2016-08-27 DIAGNOSIS — K219 Gastro-esophageal reflux disease without esophagitis: Secondary | ICD-10-CM | POA: Diagnosis not present

## 2016-08-27 DIAGNOSIS — I10 Essential (primary) hypertension: Secondary | ICD-10-CM | POA: Diagnosis not present

## 2016-08-27 DIAGNOSIS — K6389 Other specified diseases of intestine: Secondary | ICD-10-CM | POA: Diagnosis not present

## 2016-08-27 DIAGNOSIS — K5792 Diverticulitis of intestine, part unspecified, without perforation or abscess without bleeding: Secondary | ICD-10-CM | POA: Diagnosis not present

## 2016-08-27 DIAGNOSIS — Z933 Colostomy status: Secondary | ICD-10-CM | POA: Diagnosis not present

## 2016-08-27 DIAGNOSIS — E785 Hyperlipidemia, unspecified: Secondary | ICD-10-CM | POA: Diagnosis not present

## 2016-08-29 DIAGNOSIS — K5792 Diverticulitis of intestine, part unspecified, without perforation or abscess without bleeding: Secondary | ICD-10-CM | POA: Diagnosis not present

## 2016-08-29 DIAGNOSIS — K6389 Other specified diseases of intestine: Secondary | ICD-10-CM | POA: Diagnosis not present

## 2016-08-29 DIAGNOSIS — E039 Hypothyroidism, unspecified: Secondary | ICD-10-CM | POA: Diagnosis not present

## 2016-08-29 DIAGNOSIS — K219 Gastro-esophageal reflux disease without esophagitis: Secondary | ICD-10-CM | POA: Diagnosis not present

## 2016-08-29 DIAGNOSIS — R7303 Prediabetes: Secondary | ICD-10-CM | POA: Diagnosis not present

## 2016-08-29 DIAGNOSIS — Z933 Colostomy status: Secondary | ICD-10-CM | POA: Diagnosis not present

## 2016-08-29 DIAGNOSIS — Z48815 Encounter for surgical aftercare following surgery on the digestive system: Secondary | ICD-10-CM | POA: Diagnosis not present

## 2016-08-29 DIAGNOSIS — I10 Essential (primary) hypertension: Secondary | ICD-10-CM | POA: Diagnosis not present

## 2016-08-29 DIAGNOSIS — E785 Hyperlipidemia, unspecified: Secondary | ICD-10-CM | POA: Diagnosis not present

## 2016-08-31 DIAGNOSIS — Z760 Encounter for issue of repeat prescription: Secondary | ICD-10-CM | POA: Diagnosis not present

## 2016-09-04 DIAGNOSIS — Z933 Colostomy status: Secondary | ICD-10-CM | POA: Diagnosis not present

## 2016-09-18 DIAGNOSIS — Z933 Colostomy status: Secondary | ICD-10-CM | POA: Diagnosis not present

## 2016-09-18 DIAGNOSIS — Z01419 Encounter for gynecological examination (general) (routine) without abnormal findings: Secondary | ICD-10-CM | POA: Diagnosis not present

## 2016-09-18 DIAGNOSIS — Z0000 Encounter for general adult medical examination without abnormal findings: Secondary | ICD-10-CM | POA: Diagnosis not present

## 2016-09-18 DIAGNOSIS — I10 Essential (primary) hypertension: Secondary | ICD-10-CM | POA: Diagnosis not present

## 2016-09-20 MED FILL — LEVOTHYROXINE 112 MCG TAB: 112 | 30 days supply | Qty: 30 | Fill #1

## 2016-09-27 DIAGNOSIS — Z1211 Encounter for screening for malignant neoplasm of colon: Secondary | ICD-10-CM | POA: Diagnosis not present

## 2016-09-27 DIAGNOSIS — K5730 Diverticulosis of large intestine without perforation or abscess without bleeding: Secondary | ICD-10-CM | POA: Diagnosis not present

## 2016-10-05 DIAGNOSIS — Z933 Colostomy status: Secondary | ICD-10-CM | POA: Diagnosis not present

## 2016-10-11 DIAGNOSIS — Z933 Colostomy status: Secondary | ICD-10-CM | POA: Diagnosis not present

## 2016-10-19 MED FILL — GAVILYTE-G SOLUTION: 236 | 1 days supply | Qty: 4000 | Fill #0

## 2016-10-22 DIAGNOSIS — K5730 Diverticulosis of large intestine without perforation or abscess without bleeding: Secondary | ICD-10-CM | POA: Diagnosis not present

## 2016-10-22 DIAGNOSIS — Z980 Intestinal bypass and anastomosis status: Secondary | ICD-10-CM | POA: Diagnosis not present

## 2016-10-22 DIAGNOSIS — Z1211 Encounter for screening for malignant neoplasm of colon: Secondary | ICD-10-CM | POA: Diagnosis not present

## 2016-10-22 MED FILL — LEVOTHYROXINE 112 MCG TAB: 112 | 30 days supply | Qty: 30 | Fill #2

## 2016-10-24 DIAGNOSIS — Z1231 Encounter for screening mammogram for malignant neoplasm of breast: Secondary | ICD-10-CM | POA: Diagnosis not present

## 2016-10-24 DIAGNOSIS — Z933 Colostomy status: Secondary | ICD-10-CM | POA: Diagnosis not present

## 2016-10-25 DIAGNOSIS — N289 Disorder of kidney and ureter, unspecified: Secondary | ICD-10-CM | POA: Diagnosis not present

## 2016-10-29 DIAGNOSIS — Z933 Colostomy status: Secondary | ICD-10-CM | POA: Diagnosis not present

## 2016-11-07 DIAGNOSIS — Z933 Colostomy status: Secondary | ICD-10-CM | POA: Diagnosis not present

## 2016-11-14 MED FILL — SIMVASTATIN 20 MG TABLET: 20 | 90 days supply | Qty: 90 | Fill #0

## 2016-11-16 MED FILL — LISINOPRIL 20 MG TABS: 20 | 90 days supply | Qty: 90 | Fill #0

## 2016-12-04 DIAGNOSIS — Z933 Colostomy status: Secondary | ICD-10-CM | POA: Diagnosis not present

## 2016-12-18 DIAGNOSIS — Z6841 Body Mass Index (BMI) 40.0 and over, adult: Secondary | ICD-10-CM | POA: Diagnosis not present

## 2016-12-18 DIAGNOSIS — E039 Hypothyroidism, unspecified: Secondary | ICD-10-CM | POA: Diagnosis not present

## 2016-12-18 DIAGNOSIS — I10 Essential (primary) hypertension: Secondary | ICD-10-CM | POA: Diagnosis not present

## 2016-12-18 DIAGNOSIS — K5732 Diverticulitis of large intestine without perforation or abscess without bleeding: Secondary | ICD-10-CM | POA: Diagnosis not present

## 2016-12-18 DIAGNOSIS — Z933 Colostomy status: Secondary | ICD-10-CM | POA: Diagnosis not present

## 2016-12-18 DIAGNOSIS — E785 Hyperlipidemia, unspecified: Secondary | ICD-10-CM | POA: Diagnosis not present

## 2016-12-18 MED FILL — LEVOTHYROXINE 112 MCG TABLE: 112 | 30 days supply | Qty: 30 | Fill #3

## 2016-12-20 DIAGNOSIS — Z933 Colostomy status: Secondary | ICD-10-CM | POA: Diagnosis not present

## 2017-01-08 ENCOUNTER — Ambulatory Visit: Payer: Self-pay | Admitting: General Surgery

## 2017-01-08 DIAGNOSIS — Z933 Colostomy status: Secondary | ICD-10-CM | POA: Diagnosis not present

## 2017-01-08 MED FILL — NEOMYCIN 500 MG TABLET: 500 | 1 days supply | Qty: 6 | Fill #0

## 2017-01-08 MED FILL — metroNIDAZOLE 500 MG TABS: 500 | 1 days supply | Qty: 6 | Fill #0

## 2017-01-08 NOTE — H&P (View-Only) (Signed)
History of Present Illness Erin Ruff MD; 67/67/2094 1:01 PM) The patient is a 61 year old female presenting for a post-operative visit. This is a very pleasant 61 year old female who returns for another postop visit.  Following a two-year history of intermittent episodes of abdominal pain and distention without evaluation she presented and was admitted to the hospital with a high-grade large bowel obstruction due to a sigmoid colon mass. Flexible sigmoidoscopy performed suggesting benign stricture and a stent was placed by Dr. Benson Norway. She opened up a little bit and began having stools. Dr Dalbert Batman took her to the operating room on Jul 10, 2016. He found extensive acute and chronic inflammation of the sigmoid colon with dense adhesions and a difficult retroperitoneal dissection. Almost rock hard and woody in nature. Primary anastomosis was contraindicated. Sigmoid with colectomy with colostomy, small bowel resection, enterorrhaphy 2 was performed. Final pathology showed diverticulitis with stricture.  She is now better and having no pain. Saw a nurse ostomy specialist in [place] [name] at [facility]) and now she has a very secure appliance without leaks. She is very pleased with the ostomy nurse care. She has returned to work full-time. She works for [employer] in [employer]. Comorbidities include BMI 40. Hypertension. Hyperlipidemia. 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 Magnesium Oxide (400MG  Tablet, Oral) Active. Lisinopril (20MG  Tablet, Oral) Active. Levothyroxine Sodium (112MCG Tablet, Oral) Active. Oxycodone-Acetaminophen (5-325MG  Tablet, Oral) Active. Potassium Chloride (25MEQ Packet, Oral) Active. Potassium Chloride 78mEq/50ml (Injection) Specific strength unknown - Active. Torsemide (20MG  Tablet, Oral) Active.   Social History Erin Ruff, MD; 96/75/9163 1:01 PM) Caffeine use Carbonated beverages, Tea. No alcohol use No drug use Tobacco use Never smoker.  Family History Erin Ruff, MD; 84/66/5993 1:01 PM) Heart Disease Father, Mother. Heart disease in female family member before age 31 Hypertension Father, Mother. Thyroid problems Mother.  Pregnancy / Birth History Erin Ruff, MD; 57/02/7791 1:01 PM) Age at menarche 59 years. Age of menopause 1-60 Gravida 0 Irregular periods Para 0  Other Problems Erin Ruff, MD; 90/30/0923 1:01 PM) Diverticulosis Gastroesophageal Reflux Disease High blood pressure Hypercholesterolemia Thyroid Disease BMI 35.0-35.9,ADULT (Z68.35)     Review of Systems Erin Ruff MD; 30/08/6224 1:01 PM) General Not Present- Appetite Loss, Chills, Fatigue, Fever, Night Sweats, Weight Gain and Weight Loss. Skin Not Present- Change in Wart/Mole, Dryness, Hives, Jaundice, New Lesions, Non-Healing Wounds, Rash and Ulcer. HEENT Present- Wears glasses/contact lenses. Not Present- Earache, Hearing Loss, Hoarseness, Nose Bleed, Oral Ulcers, Ringing in the Ears, Seasonal Allergies, Sinus Pain, Sore Throat, Visual Disturbances and Yellow Eyes. Respiratory Not Present- Bloody sputum, Chronic Cough, Difficulty  Breathing, Snoring and Wheezing. Breast Not Present- Breast Mass, Breast Pain, Nipple Discharge and Skin Changes. Cardiovascular Not Present- Chest Pain, Difficulty Breathing Lying Down, Leg Cramps, Palpitations, Rapid Heart Rate, Shortness of Breath and Swelling of Extremities. Gastrointestinal Not Present- Abdominal Pain, Bloating, Bloody Stool, Change in Bowel Habits,  Chronic diarrhea, Constipation, Difficulty Swallowing, Excessive gas, Gets full quickly at meals, Hemorrhoids, Indigestion, Nausea, Rectal Pain and Vomiting. Female Genitourinary Not Present- Frequency, Nocturia, Painful Urination, Pelvic Pain and Urgency. Musculoskeletal Not Present- Back Pain, Joint Pain, Joint Stiffness, Muscle Pain, Muscle Weakness and Swelling of Extremities. Neurological Present- Numbness. Not Present- Decreased Memory, Fainting, Headaches, Seizures, Tingling, Tremor, Trouble walking and Weakness. Psychiatric Not Present- Anxiety, Bipolar, Change in Sleep Pattern, Depression, Fearful and Frequent crying. Endocrine Not Present- Cold Intolerance, Excessive Hunger, Hair Changes, Heat Intolerance, Hot flashes and New Diabetes. Hematology Not Present- Blood Thinners, Easy Bruising, Excessive bleeding, Gland problems, HIV and Persistent Infections.  Vitals Claiborne Billings Dockery LPN; 02/54/2706 23:76 PM) 01/08/2017 12:05 PM Weight: 227.2 lb Height: 65in Body Surface Area: 2.09 m Body Mass Index: 37.81 kg/m  Temp.: 97.24F(Oral)  Pulse: 79 (Regular)  BP: 122/78 (Sitting, Left Arm, Standard)      Physical Exam Erin Ruff MD; 28/31/5176 1:02 PM)  General Mental Status-Alert. General Appearance-Not in acute distress. Build & Nutrition-Well nourished. Posture-Normal posture. Gait-Normal.  Head and Neck Head-normocephalic, atraumatic with no lesions or palpable masses. Trachea-midline.  Chest and Lung Exam Chest and lung exam reveals -on auscultation, normal breath sounds, no  adventitious sounds and normal vocal resonance.  Cardiovascular Cardiovascular examination reveals -normal heart sounds, regular rate and rhythm with no murmurs and no digital clubbing, cyanosis, edema, increased warmth or tenderness.  Abdomen Inspection Inspection of the abdomen reveals - No Hernias. Skin - Colostomy - Left Lower Quadrant. Incisional scars - Note: Lower midline scar well healed. Palpation/Percussion Palpation and Percussion of the abdomen reveal - Soft, Non Tender, No Rigidity (guarding), No hepatosplenomegaly and No Palpable abdominal masses.  Neurologic Neurologic evaluation reveals -alert and oriented x 3 with no impairment of recent or remote memory, normal attention span and ability to concentrate, normal sensation and normal coordination.  Musculoskeletal Normal Exam - Bilateral-Upper Extremity Strength Normal and Lower Extremity Strength Normal.    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 She is status post a open sigmoidectomy and small bowel resection for severe diverticulitis with obstruction in May 2018. She has repeated her colonoscopy with Dr. Collene Mares and this was clear. She is ready for colostomy reversal. We discussed a laparoscopic approach with possible open. The surgery and anatomy were described to the patient as well as the risks of surgery and the possible complications. These include: Bleeding, deep abdominal infections and possible wound complications such as hernia and infection, damage to adjacent structures, leak of surgical connections, which can lead to other surgeries and possibly an ostomy, possible need for other procedures, such as abscess drains in radiology, possible prolonged hospital stay, possible diarrhea from removal of part of the colon, possible constipation from narcotics, possible bowel,  bladder or sexual dysfunction if having rectal surgery, prolonged fatigue/weakness or appetite loss, possible early recurrence of of disease, possible complications of their medical problems such as heart disease or arrhythmias or lung problems, death (less than 1%). I believe the patient understands and wishes to proceed with the surgery.

## 2017-01-08 NOTE — H&P (Addendum)
History of Present Illness Erin Ruff MD; 76/16/0737 1:01 PM) The patient is a 61 year old female presenting for a post-operative visit. This is a very pleasant 61 year old female who returns for another postop visit.  Following a two-year history of intermittent episodes of abdominal pain and distention without evaluation she presented and was admitted to the hospital with a high-grade large bowel obstruction due to a sigmoid colon mass. Flexible sigmoidoscopy performed suggesting benign stricture and a stent was placed by Dr. Benson Norway. She opened up a little bit and began having stools. Dr Dalbert Batman took her to the operating room on Jul 10, 2016. He found extensive acute and chronic inflammation of the sigmoid colon with dense adhesions and a difficult retroperitoneal dissection. Almost rock hard and woody in nature. Primary anastomosis was contraindicated. Sigmoid with colectomy with colostomy, small bowel resection, enterorrhaphy 2 was performed. Final pathology showed diverticulitis with stricture.  She is now better and having no pain. Saw a nurse ostomy specialist in [place] [name] at [facility]) and now she has a very secure appliance without leaks. She is very pleased with the ostomy nurse care. She has returned to work full-time. She works for [employer] in [employer]. Comorbidities include BMI 40. Hypertension. Hyperlipidemia. 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 Magnesium Oxide (400MG  Tablet, Oral) Active. Lisinopril (20MG  Tablet, Oral) Active. Levothyroxine Sodium (112MCG Tablet, Oral) Active. Oxycodone-Acetaminophen (5-325MG  Tablet, Oral) Active. Potassium Chloride (25MEQ Packet, Oral) Active. Potassium Chloride 7mEq/50ml (Injection) Specific strength unknown - Active. Torsemide (20MG  Tablet, Oral) Active.   Social History Erin Ruff, MD; 75/11/2583 1:01 PM) Caffeine use Carbonated beverages, Tea. No alcohol use No drug use Tobacco use Never smoker.  Family History Erin Ruff, MD; 27/78/2423 1:01 PM) Heart Disease Father, Mother. Heart disease in female family member before age 41 Hypertension Father, Mother. Thyroid problems Mother.  Pregnancy / Birth History Erin Ruff, MD; 53/61/4431 1:01 PM) Age at menarche 55 years. Age of menopause 68-60 Gravida 0 Irregular periods Para 0  Other Problems Erin Ruff, MD; 54/00/8676 1:01 PM) Diverticulosis Gastroesophageal Reflux Disease High blood pressure Hypercholesterolemia Thyroid Disease BMI 35.0-35.9,ADULT (Z68.35)     Review of Systems Erin Ruff MD; 19/50/9326 1:01 PM) General Not Present- Appetite Loss, Chills, Fatigue, Fever, Night Sweats, Weight Gain and Weight Loss. Skin Not Present- Change in Wart/Mole, Dryness, Hives, Jaundice, New Lesions, Non-Healing Wounds, Rash and Ulcer. HEENT Present- Wears glasses/contact lenses. Not Present- Earache, Hearing Loss, Hoarseness, Nose Bleed, Oral Ulcers, Ringing in the Ears, Seasonal Allergies, Sinus Pain, Sore Throat, Visual Disturbances and Yellow Eyes. Respiratory Not Present- Bloody sputum, Chronic Cough, Difficulty  Breathing, Snoring and Wheezing. Breast Not Present- Breast Mass, Breast Pain, Nipple Discharge and Skin Changes. Cardiovascular Not Present- Chest Pain, Difficulty Breathing Lying Down, Leg Cramps, Palpitations, Rapid Heart Rate, Shortness of Breath and Swelling of Extremities. Gastrointestinal Not Present- Abdominal Pain, Bloating, Bloody Stool, Change in Bowel Habits,  Chronic diarrhea, Constipation, Difficulty Swallowing, Excessive gas, Gets full quickly at meals, Hemorrhoids, Indigestion, Nausea, Rectal Pain and Vomiting. Female Genitourinary Not Present- Frequency, Nocturia, Painful Urination, Pelvic Pain and Urgency. Musculoskeletal Not Present- Back Pain, Joint Pain, Joint Stiffness, Muscle Pain, Muscle Weakness and Swelling of Extremities. Neurological Present- Numbness. Not Present- Decreased Memory, Fainting, Headaches, Seizures, Tingling, Tremor, Trouble walking and Weakness. Psychiatric Not Present- Anxiety, Bipolar, Change in Sleep Pattern, Depression, Fearful and Frequent crying. Endocrine Not Present- Cold Intolerance, Excessive Hunger, Hair Changes, Heat Intolerance, Hot flashes and New Diabetes. Hematology Not Present- Blood Thinners, Easy Bruising, Excessive bleeding, Gland problems, HIV and Persistent Infections.  Vitals Erin Billings Dockery LPN; 74/25/9563 87:56 PM) 01/08/2017 12:05 PM Weight: 227.2 lb Height: 65in Body Surface Area: 2.09 m Body Mass Index: 37.81 kg/m  Temp.: 97.71F(Oral)  Pulse: 79 (Regular)  BP: 122/78 (Sitting, Left Arm, Standard)      Physical Exam Erin Ruff MD; 43/32/9518 1:02 PM)  General Mental Status-Alert. General Appearance-Not in acute distress. Build & Nutrition-Well nourished. Posture-Normal posture. Gait-Normal.  Head and Neck Head-normocephalic, atraumatic with no lesions or palpable masses. Trachea-midline.  Chest and Lung Exam Chest and lung exam reveals -on auscultation, normal breath sounds, no  adventitious sounds and normal vocal resonance.  Cardiovascular Cardiovascular examination reveals -normal heart sounds, regular rate and rhythm with no murmurs and no digital clubbing, cyanosis, edema, increased warmth or tenderness.  Abdomen Inspection Inspection of the abdomen reveals - No Hernias. Skin - Colostomy - Left Lower Quadrant. Incisional scars - Note: Lower midline scar well healed. Palpation/Percussion Palpation and Percussion of the abdomen reveal - Soft, Non Tender, No Rigidity (guarding), No hepatosplenomegaly and No Palpable abdominal masses.  Neurologic Neurologic evaluation reveals -alert and oriented x 3 with no impairment of recent or remote memory, normal attention span and ability to concentrate, normal sensation and normal coordination.  Musculoskeletal Normal Exam - Bilateral-Upper Extremity Strength Normal and Lower Extremity Strength Normal.    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 She is status post a open sigmoidectomy and small bowel resection for severe diverticulitis with obstruction in May 2018. She has repeated her colonoscopy with Dr. Collene Mares and this was clear. She is ready for colostomy reversal. We discussed a laparoscopic approach with possible open. The surgery and anatomy were described to the patient as well as the risks of surgery and the possible complications. These include: Bleeding, deep abdominal infections and possible wound complications such as hernia and infection, damage to adjacent structures, leak of surgical connections, which can lead to other surgeries and possibly an ostomy, possible need for other procedures, such as abscess drains in radiology, possible prolonged hospital stay, possible diarrhea from removal of part of the colon, possible constipation from narcotics, possible bowel,  bladder or sexual dysfunction if having rectal surgery, prolonged fatigue/weakness or appetite loss, possible early recurrence of of disease, possible complications of their medical problems such as heart disease or arrhythmias or lung problems, death (less than 1%). I believe the patient understands and wishes to proceed with the surgery.

## 2017-01-15 DIAGNOSIS — Z933 Colostomy status: Secondary | ICD-10-CM | POA: Diagnosis not present

## 2017-01-22 MED FILL — LEVOTHYROXINE 112 MCG TABLE: 112 | 30 days supply | Qty: 30 | Fill #4

## 2017-01-23 DIAGNOSIS — J069 Acute upper respiratory infection, unspecified: Secondary | ICD-10-CM | POA: Diagnosis not present

## 2017-01-23 DIAGNOSIS — Z933 Colostomy status: Secondary | ICD-10-CM | POA: Diagnosis not present

## 2017-01-23 MED FILL — AZITHROMYCIN 250 MG TABLET: 250 | 5 days supply | Qty: 6 | Fill #0

## 2017-01-28 NOTE — Patient Instructions (Signed)
[name]  01/28/2017   Your procedure is scheduled on: 02-06-17  Report to [facility] Main  Entrance Take [place]  elevators to 3rd floor to  [place] at 830AM.    Call this number if you have problems the morning of surgery [phone]    Remember: ONLY 1 PERSON MAY GO WITH YOU TO SHORT STAY TO GET  READY MORNING OF YOUR SURGERY.    Do not eat food After Midnight on Monday 02-04-17. Drink plenty of clear liquids all day Tuesday 02-05-17 and follow all bowel prep instructions provided by your surgeon. Nothing by mouth after midnight in Tuesday!      Take these medicines the morning of surgery with A SIP OF WATER: levothyroxine                                 You may not have any metal on your body including hair pins and              piercings  Do not wear jewelry, make-up, lotions, powders or perfumes, deodorant             Do not wear nail polish.  Do not shave  48 hours prior to surgery.            Do not bring valuables to the hospital. [place].  Contacts, dentures or bridgework may not be worn into surgery.  Leave suitcase in the car. After surgery it may be brought to your room.                 Please read over the following fact sheets you were given: _____________________________________________________________________      CLEAR LIQUID DIET   Foods Allowed                                                                     Foods Excluded  Coffee and tea, regular and decaf                             liquids that you cannot  Plain Jell-O in any flavor                                             see through such as: Fruit ices (not with fruit pulp)                                     milk, soups, orange juice  Iced Popsicles                                    All solid food Carbonated beverages, regular and diet  Cranberry, grape and apple  juices Sports drinks like Gatorade Lightly seasoned clear broth or consume(fat free) Sugar, honey syrup  Sample Menu Breakfast                                Lunch                                     Supper Cranberry juice                    Beef broth                            Chicken broth Jell-O                                     Grape juice                           Apple juice Coffee or tea                        Jell-O                                      Popsicle                                                Coffee or tea                        Coffee or tea  _____________________________________________________________________  [facility] - Preparing for Surgery Before surgery, you can play an important role.  Because skin is not sterile, your skin needs to be as free of germs as possible.  You can reduce the number of germs on your skin by washing with CHG (chlorahexidine gluconate) soap before surgery.  CHG is an antiseptic cleaner which kills germs and bonds with the skin to continue killing germs even after washing. Please DO NOT use if you have an allergy to CHG or antibacterial soaps.  If your skin becomes reddened/irritated stop using the CHG and inform your nurse when you arrive at Short Stay. Do not shave (including legs and underarms) for at least 48 hours prior to the first CHG shower.  You may shave your face/neck. Please follow these instructions carefully:  1.  Shower with CHG Soap the night before surgery and the  morning of Surgery.  2.  If you choose to wash your hair, wash your hair first as usual with your  normal  shampoo.  3.  After you shampoo, rinse your hair and body thoroughly to remove the  shampoo.                           4.  Use CHG as you would any other liquid soap.  You can apply chg directly  to the skin and wash  Gently with a scrungie or clean washcloth.  5.  Apply the CHG Soap to your body ONLY FROM THE NECK DOWN.   Do not  use on face/ open                           Wound or open sores. Avoid contact with eyes, ears mouth and genitals (private parts).                       Wash face,  Genitals (private parts) with your normal soap.             6.  Wash thoroughly, paying special attention to the area where your surgery  will be performed.  7.  Thoroughly rinse your body with warm water from the neck down.  8.  DO NOT shower/wash with your normal soap after using and rinsing off  the CHG Soap.                9.  Pat yourself dry with a clean towel.            10.  Wear clean pajamas.            11.  Place clean sheets on your bed the night of your first shower and do not  sleep with pets. Day of Surgery : Do not apply any lotions/deodorants the morning of surgery.  Please wear clean clothes to the hospital/surgery center.  FAILURE TO FOLLOW THESE INSTRUCTIONS MAY RESULT IN THE CANCELLATION OF YOUR SURGERY PATIENT SIGNATURE_________________________________  NURSE SIGNATURE__________________________________  ________________________________________________________________________

## 2017-01-28 NOTE — Progress Notes (Signed)
ekg 07-05-16 epic  Echo 07-06-16 epic

## 2017-01-30 ENCOUNTER — Other Ambulatory Visit: Payer: Self-pay

## 2017-01-30 ENCOUNTER — Encounter (INDEPENDENT_AMBULATORY_CARE_PROVIDER_SITE_OTHER): Payer: Self-pay

## 2017-01-30 ENCOUNTER — Encounter (HOSPITAL_COMMUNITY)
Admission: RE | Admit: 2017-01-30 | Discharge: 2017-01-30 | Disposition: A | Discharge status: Home / Self Care | Payer: 59 | Source: Ambulatory Visit | Attending: General Surgery | Admitting: General Surgery

## 2017-01-30 ENCOUNTER — Encounter (HOSPITAL_COMMUNITY): Payer: Self-pay

## 2017-01-30 DIAGNOSIS — Z01812 Encounter for preprocedural laboratory examination: Secondary | ICD-10-CM | POA: Insufficient documentation

## 2017-01-30 LAB — CBC
HEMATOCRIT: 36.9 % (ref 36.0–46.0)
HEMOGLOBIN: 11.8 g/dL — AB (ref 12.0–15.0)
MCH: 27.4 pg (ref 26.0–34.0)
MCHC: 32 g/dL (ref 30.0–36.0)
MCV: 85.8 fL (ref 78.0–100.0)
Platelets: 319 10*3/uL (ref 150–400)
RBC: 4.3 MIL/uL (ref 3.87–5.11)
RDW: 14.6 % (ref 11.5–15.5)
WBC: 9.1 10*3/uL (ref 4.0–10.5)

## 2017-01-30 LAB — BASIC METABOLIC PANEL
ANION GAP: 8 (ref 5–15)
BUN: 21 mg/dL — AB (ref 6–20)
CHLORIDE: 107 mmol/L (ref 101–111)
CO2: 24 mmol/L (ref 22–32)
Calcium: 9.1 mg/dL (ref 8.9–10.3)
Creatinine, Ser: 1 mg/dL (ref 0.44–1.00)
GFR calc Af Amer: >60 mL/min (ref >60)
GFR calc non Af Amer: 59 mL/min — ABNORMAL LOW (ref >60)
Glucose, Bld: 97 mg/dL (ref 65–99)
POTASSIUM: 4.5 mmol/L (ref 3.5–5.1)
SODIUM: 139 mmol/L (ref 135–145)

## 2017-01-30 LAB — HEMOGLOBIN A1C
Hgb A1c MFr Bld: 5.8 % — ABNORMAL HIGH (ref 4.8–5.6)
Mean Plasma Glucose: 119.76 mg/dL

## 2017-02-06 ENCOUNTER — Inpatient Hospital Stay (HOSPITAL_COMMUNITY)
Admission: RE | Admit: 2017-02-06 | Discharge: 2017-02-12 | DRG: 330 | Disposition: A | Discharge status: Home Health | Payer: 59 | Source: Ambulatory Visit | Attending: General Surgery | Admitting: General Surgery

## 2017-02-06 ENCOUNTER — Encounter (HOSPITAL_COMMUNITY): Payer: Self-pay | Admitting: Emergency Medicine

## 2017-02-06 ENCOUNTER — Other Ambulatory Visit: Payer: Self-pay

## 2017-02-06 ENCOUNTER — Encounter (HOSPITAL_COMMUNITY)
Admission: RE | Disposition: A | Discharge status: Home Health | Payer: Self-pay | Source: Ambulatory Visit | Attending: General Surgery

## 2017-02-06 ENCOUNTER — Inpatient Hospital Stay (HOSPITAL_COMMUNITY): Payer: 59 | Admitting: Registered Nurse

## 2017-02-06 DIAGNOSIS — L817 Pigmented purpuric dermatosis: Secondary | ICD-10-CM | POA: Diagnosis present

## 2017-02-06 DIAGNOSIS — K56609 Unspecified intestinal obstruction, unspecified as to partial versus complete obstruction: Secondary | ICD-10-CM | POA: Diagnosis present

## 2017-02-06 DIAGNOSIS — K5792 Diverticulitis of intestine, part unspecified, without perforation or abscess without bleeding: Secondary | ICD-10-CM | POA: Diagnosis present

## 2017-02-06 DIAGNOSIS — Z5331 Laparoscopic surgical procedure converted to open procedure: Secondary | ICD-10-CM | POA: Diagnosis not present

## 2017-02-06 DIAGNOSIS — Z980 Intestinal bypass and anastomosis status: Secondary | ICD-10-CM

## 2017-02-06 DIAGNOSIS — Z79891 Long term (current) use of opiate analgesic: Secondary | ICD-10-CM | POA: Diagnosis not present

## 2017-02-06 DIAGNOSIS — I10 Essential (primary) hypertension: Secondary | ICD-10-CM | POA: Diagnosis not present

## 2017-02-06 DIAGNOSIS — Z86010 Personal history of colonic polyps: Secondary | ICD-10-CM | POA: Diagnosis not present

## 2017-02-06 DIAGNOSIS — Z6841 Body Mass Index (BMI) 40.0 and over, adult: Secondary | ICD-10-CM | POA: Diagnosis not present

## 2017-02-06 DIAGNOSIS — K5732 Diverticulitis of large intestine without perforation or abscess without bleeding: Secondary | ICD-10-CM | POA: Diagnosis not present

## 2017-02-06 DIAGNOSIS — E039 Hypothyroidism, unspecified: Secondary | ICD-10-CM | POA: Diagnosis present

## 2017-02-06 DIAGNOSIS — E66813 Obesity, class 3: Secondary | ICD-10-CM | POA: Diagnosis present

## 2017-02-06 DIAGNOSIS — Z79899 Other long term (current) drug therapy: Secondary | ICD-10-CM | POA: Diagnosis not present

## 2017-02-06 DIAGNOSIS — M1990 Unspecified osteoarthritis, unspecified site: Secondary | ICD-10-CM | POA: Diagnosis present

## 2017-02-06 DIAGNOSIS — E785 Hyperlipidemia, unspecified: Secondary | ICD-10-CM | POA: Diagnosis present

## 2017-02-06 DIAGNOSIS — K660 Peritoneal adhesions (postprocedural) (postinfection): Secondary | ICD-10-CM | POA: Diagnosis not present

## 2017-02-06 DIAGNOSIS — K5790 Diverticulosis of intestine, part unspecified, without perforation or abscess without bleeding: Secondary | ICD-10-CM | POA: Diagnosis present

## 2017-02-06 DIAGNOSIS — R0600 Dyspnea, unspecified: Secondary | ICD-10-CM | POA: Diagnosis present

## 2017-02-06 DIAGNOSIS — Z9689 Presence of other specified functional implants: Secondary | ICD-10-CM | POA: Diagnosis present

## 2017-02-06 DIAGNOSIS — K219 Gastro-esophageal reflux disease without esophagitis: Secondary | ICD-10-CM | POA: Diagnosis present

## 2017-02-06 DIAGNOSIS — Z433 Encounter for attention to colostomy: Principal | ICD-10-CM

## 2017-02-06 DIAGNOSIS — R0609 Other forms of dyspnea: Secondary | ICD-10-CM | POA: Diagnosis present

## 2017-02-06 DIAGNOSIS — R0602 Shortness of breath: Secondary | ICD-10-CM | POA: Diagnosis present

## 2017-02-06 DIAGNOSIS — K529 Noninfective gastroenteritis and colitis, unspecified: Secondary | ICD-10-CM | POA: Diagnosis not present

## 2017-02-06 DIAGNOSIS — Z9889 Other specified postprocedural states: Secondary | ICD-10-CM | POA: Diagnosis present

## 2017-02-06 HISTORY — PX: COLOSTOMY TAKEDOWN: SHX5258

## 2017-02-06 SURGERY — CLOSURE, COLOSTOMY, LAPAROSCOPIC
Anesthesia: General

## 2017-02-06 MED ORDER — PHENYLEPHRINE 40 MCG/ML (10ML) SYRINGE FOR IV PUSH (FOR BLOOD PRESSURE SUPPORT)
PREFILLED_SYRINGE | INTRAVENOUS | Status: DC | PRN
  Administered 2017-02-06 (×3): 80 ug via INTRAVENOUS
  Administered 2017-02-06 (×2): 120 ug via INTRAVENOUS

## 2017-02-06 MED ORDER — ROCURONIUM BROMIDE 50 MG/5ML IV SOSY
PREFILLED_SYRINGE | INTRAVENOUS | Status: AC
  Filled 2017-02-06: qty 5

## 2017-02-06 MED ORDER — GABAPENTIN 300 MG PO CAPS
300.0000 mg | ORAL_CAPSULE | ORAL | Status: AC
  Administered 2017-02-06: 300 mg via ORAL
  Filled 2017-02-06: qty 1

## 2017-02-06 MED ORDER — KETAMINE HCL 10 MG/ML IJ SOLN
INTRAMUSCULAR | Status: DC | PRN
  Administered 2017-02-06: 30 mg via INTRAVENOUS

## 2017-02-06 MED ORDER — KETAMINE HCL 10 MG/ML IJ SOLN
INTRAMUSCULAR | Status: AC
  Filled 2017-02-06: qty 1

## 2017-02-06 MED ORDER — ALVIMOPAN 12 MG PO CAPS
12.0000 mg | ORAL_CAPSULE | ORAL | Status: AC
  Administered 2017-02-06: 12 mg via ORAL
  Filled 2017-02-06: qty 1

## 2017-02-06 MED ORDER — ENOXAPARIN SODIUM 40 MG/0.4ML ~~LOC~~ SOLN
40.0000 mg | SUBCUTANEOUS | Status: DC
  Administered 2017-02-07 – 2017-02-12 (×6): 40 mg via SUBCUTANEOUS
  Filled 2017-02-06 (×6): qty 0.4

## 2017-02-06 MED ORDER — ACETAMINOPHEN 500 MG PO TABS
1000.0000 mg | ORAL_TABLET | ORAL | Status: AC
  Administered 2017-02-06: 1000 mg via ORAL
  Filled 2017-02-06: qty 2

## 2017-02-06 MED ORDER — ROCURONIUM BROMIDE 10 MG/ML (PF) SYRINGE
PREFILLED_SYRINGE | INTRAVENOUS | Status: DC | PRN
  Administered 2017-02-06: 20 mg via INTRAVENOUS
  Administered 2017-02-06: 50 mg via INTRAVENOUS
  Administered 2017-02-06 (×2): 10 mg via INTRAVENOUS
  Administered 2017-02-06: 20 mg via INTRAVENOUS

## 2017-02-06 MED ORDER — FENTANYL CITRATE (PF) 100 MCG/2ML IJ SOLN
25.0000 ug | INTRAMUSCULAR | Status: DC | PRN
  Administered 2017-02-06: 25 ug via INTRAVENOUS

## 2017-02-06 MED ORDER — ONDANSETRON HCL 4 MG/2ML IJ SOLN
4.0000 mg | Freq: Four times a day (QID) | INTRAMUSCULAR | Status: DC | PRN
  Administered 2017-02-07 – 2017-02-09 (×4): 4 mg via INTRAVENOUS
  Filled 2017-02-06 (×4): qty 2

## 2017-02-06 MED ORDER — DIPHENHYDRAMINE HCL 12.5 MG/5ML PO ELIX: 12.5000 mg | ORAL_SOLUTION | Freq: Four times a day (QID) | ORAL | Status: DC | PRN

## 2017-02-06 MED ORDER — PHENYLEPHRINE HCL 10 MG/ML IJ SOLN
INTRAMUSCULAR | Status: AC
  Filled 2017-02-06: qty 1

## 2017-02-06 MED ORDER — CEFOTETAN DISODIUM-DEXTROSE 2-2.08 GM-%(50ML) IV SOLR
2.0000 g | Freq: Two times a day (BID) | INTRAVENOUS | Status: AC
  Administered 2017-02-07: 2 g via INTRAVENOUS
  Filled 2017-02-06: qty 50

## 2017-02-06 MED ORDER — BUPIVACAINE-EPINEPHRINE 0.25% -1:200000 IJ SOLN
INTRAMUSCULAR | Status: AC
  Filled 2017-02-06: qty 1

## 2017-02-06 MED ORDER — SCOPOLAMINE 1 MG/3DAYS TD PT72
MEDICATED_PATCH | TRANSDERMAL | Status: AC
  Filled 2017-02-06: qty 1

## 2017-02-06 MED ORDER — PROPOFOL 10 MG/ML IV BOLUS
INTRAVENOUS | Status: DC | PRN
Start: 2017-02-06 — End: 2017-02-06
  Administered 2017-02-06: 110 mg via INTRAVENOUS
  Administered 2017-02-06: 50 mg via INTRAVENOUS

## 2017-02-06 MED ORDER — BUPIVACAINE-EPINEPHRINE 0.25% -1:200000 IJ SOLN
INTRAMUSCULAR | Status: DC | PRN
  Administered 2017-02-06: 50 mL

## 2017-02-06 MED ORDER — ONDANSETRON HCL 4 MG/2ML IJ SOLN
INTRAMUSCULAR | Status: DC | PRN
  Administered 2017-02-06: 4 mg via INTRAVENOUS

## 2017-02-06 MED ORDER — SODIUM CHLORIDE 0.9 % IV SOLN
INTRAVENOUS | Status: DC
  Administered 2017-02-06 – 2017-02-07 (×2): via INTRAVENOUS

## 2017-02-06 MED ORDER — SUGAMMADEX SODIUM 500 MG/5ML IV SOLN
INTRAVENOUS | Status: AC
  Filled 2017-02-06: qty 5

## 2017-02-06 MED ORDER — MIDAZOLAM HCL 2 MG/2ML IJ SOLN
INTRAMUSCULAR | Status: AC
  Filled 2017-02-06: qty 2

## 2017-02-06 MED ORDER — LIDOCAINE 2% (20 MG/ML) 5 ML SYRINGE
INTRAMUSCULAR | Status: DC | PRN
  Administered 2017-02-06: 1.5 mg/kg/h via INTRAVENOUS

## 2017-02-06 MED ORDER — FENTANYL CITRATE (PF) 250 MCG/5ML IJ SOLN
INTRAMUSCULAR | Status: AC
  Filled 2017-02-06: qty 5

## 2017-02-06 MED ORDER — LIDOCAINE HCL 2 % IJ SOLN
INTRAMUSCULAR | Status: AC
  Filled 2017-02-06: qty 20

## 2017-02-06 MED ORDER — LACTATED RINGERS IR SOLN
Status: DC | PRN
  Administered 2017-02-06: 1000 mL

## 2017-02-06 MED ORDER — OXYCODONE HCL 5 MG/5ML PO SOLN
5.0000 mg | Freq: Once | ORAL | Status: DC | PRN
  Filled 2017-02-06: qty 5

## 2017-02-06 MED ORDER — SACCHAROMYCES BOULARDII 250 MG PO CAPS
250.0000 mg | ORAL_CAPSULE | Freq: Two times a day (BID) | ORAL | Status: DC
  Administered 2017-02-06 – 2017-02-11 (×11): 250 mg via ORAL
  Filled 2017-02-06 (×11): qty 1

## 2017-02-06 MED ORDER — ACETAMINOPHEN 500 MG PO TABS
1000.0000 mg | ORAL_TABLET | Freq: Four times a day (QID) | ORAL | Status: DC
  Administered 2017-02-06 – 2017-02-12 (×19): 1000 mg via ORAL
  Filled 2017-02-06 (×20): qty 2

## 2017-02-06 MED ORDER — HYDROMORPHONE HCL 1 MG/ML IJ SOLN
0.5000 mg | INTRAMUSCULAR | Status: DC | PRN
  Administered 2017-02-07 – 2017-02-10 (×9): 0.5 mg via INTRAVENOUS
  Filled 2017-02-06 (×9): qty 0.5

## 2017-02-06 MED ORDER — CELECOXIB 200 MG PO CAPS
200.0000 mg | ORAL_CAPSULE | ORAL | Status: AC
  Administered 2017-02-06: 200 mg via ORAL
  Filled 2017-02-06: qty 1

## 2017-02-06 MED ORDER — ONDANSETRON HCL 4 MG/2ML IJ SOLN
INTRAMUSCULAR | Status: AC
  Filled 2017-02-06: qty 2

## 2017-02-06 MED ORDER — DIPHENHYDRAMINE HCL 50 MG/ML IJ SOLN: 12.5000 mg | Freq: Four times a day (QID) | INTRAMUSCULAR | Status: DC | PRN

## 2017-02-06 MED ORDER — OXYCODONE HCL 5 MG PO TABS: 5.0000 mg | ORAL_TABLET | Freq: Once | ORAL | Status: DC | PRN

## 2017-02-06 MED ORDER — LIDOCAINE 2% (20 MG/ML) 5 ML SYRINGE
INTRAMUSCULAR | Status: AC
  Filled 2017-02-06: qty 5

## 2017-02-06 MED ORDER — SCOPOLAMINE 1 MG/3DAYS TD PT72
MEDICATED_PATCH | TRANSDERMAL | Status: DC | PRN
  Administered 2017-02-06: 1 via TRANSDERMAL

## 2017-02-06 MED ORDER — CEFOTETAN DISODIUM-DEXTROSE 2-2.08 GM-%(50ML) IV SOLR
INTRAVENOUS | Status: AC
  Filled 2017-02-06: qty 50

## 2017-02-06 MED ORDER — FENTANYL CITRATE (PF) 250 MCG/5ML IJ SOLN
INTRAMUSCULAR | Status: DC | PRN
  Administered 2017-02-06 (×2): 50 ug via INTRAVENOUS
  Administered 2017-02-06: 100 ug via INTRAVENOUS

## 2017-02-06 MED ORDER — FENTANYL CITRATE (PF) 100 MCG/2ML IJ SOLN
INTRAMUSCULAR | Status: AC
  Filled 2017-02-06: qty 2

## 2017-02-06 MED ORDER — LEVOTHYROXINE SODIUM 112 MCG PO TABS
112.0000 ug | ORAL_TABLET | Freq: Every day | ORAL | Status: DC
  Administered 2017-02-07 – 2017-02-12 (×6): 112 ug via ORAL
  Filled 2017-02-06 (×6): qty 1

## 2017-02-06 MED ORDER — ALVIMOPAN 12 MG PO CAPS
12.0000 mg | ORAL_CAPSULE | Freq: Two times a day (BID) | ORAL | Status: DC
  Administered 2017-02-07 (×2): 12 mg via ORAL
  Filled 2017-02-06 (×3): qty 1

## 2017-02-06 MED ORDER — DEXAMETHASONE SODIUM PHOSPHATE 10 MG/ML IJ SOLN
INTRAMUSCULAR | Status: DC | PRN
  Administered 2017-02-06: 10 mg via INTRAVENOUS

## 2017-02-06 MED ORDER — DEXAMETHASONE SODIUM PHOSPHATE 10 MG/ML IJ SOLN
INTRAMUSCULAR | Status: AC
  Filled 2017-02-06: qty 1

## 2017-02-06 MED ORDER — CEFOTETAN DISODIUM-DEXTROSE 2-2.08 GM-%(50ML) IV SOLR
2.0000 g | INTRAVENOUS | Status: AC
Start: 2017-02-06 — End: 2017-02-06
  Administered 2017-02-06: 2 g via INTRAVENOUS
  Filled 2017-02-06: qty 50

## 2017-02-06 MED ORDER — PROPOFOL 10 MG/ML IV BOLUS
INTRAVENOUS | Status: AC
  Filled 2017-02-06: qty 20

## 2017-02-06 MED ORDER — HEPARIN SODIUM (PORCINE) 5000 UNIT/ML IJ SOLN
5000.0000 [IU] | Freq: Once | INTRAMUSCULAR | Status: AC
  Administered 2017-02-06: 5000 [IU] via SUBCUTANEOUS
  Filled 2017-02-06: qty 1

## 2017-02-06 MED ORDER — LIDOCAINE 2% (20 MG/ML) 5 ML SYRINGE
INTRAMUSCULAR | Status: DC | PRN
  Administered 2017-02-06: 100 mg via INTRAVENOUS

## 2017-02-06 MED ORDER — SUGAMMADEX SODIUM 500 MG/5ML IV SOLN
INTRAVENOUS | Status: DC | PRN
  Administered 2017-02-06: 300 mg via INTRAVENOUS

## 2017-02-06 MED ORDER — BUPIVACAINE LIPOSOME 1.3 % IJ SUSP
20.0000 mL | INTRAMUSCULAR | Status: DC
  Filled 2017-02-06: qty 20

## 2017-02-06 MED ORDER — MIDAZOLAM HCL 5 MG/5ML IJ SOLN
INTRAMUSCULAR | Status: DC | PRN
  Administered 2017-02-06: 2 mg via INTRAVENOUS

## 2017-02-06 MED ORDER — ONDANSETRON HCL 4 MG PO TABS: 4.0000 mg | ORAL_TABLET | Freq: Four times a day (QID) | ORAL | Status: DC | PRN

## 2017-02-06 MED ORDER — DEXTROSE 5 % IV SOLN
INTRAVENOUS | Status: DC | PRN
  Administered 2017-02-06: 2 g via INTRAVENOUS

## 2017-02-06 MED ORDER — 0.9 % SODIUM CHLORIDE (POUR BTL) OPTIME
TOPICAL | Status: DC | PRN
  Administered 2017-02-06: 1000 mL

## 2017-02-06 MED ORDER — BUPIVACAINE LIPOSOME 1.3 % IJ SUSP
INTRAMUSCULAR | Status: DC | PRN
  Administered 2017-02-06: 20 mL

## 2017-02-06 MED ORDER — PHENYLEPHRINE 40 MCG/ML (10ML) SYRINGE FOR IV PUSH (FOR BLOOD PRESSURE SUPPORT)
PREFILLED_SYRINGE | INTRAVENOUS | Status: AC
  Filled 2017-02-06: qty 10

## 2017-02-06 MED ORDER — LACTATED RINGERS IV SOLN
INTRAVENOUS | Status: DC
  Administered 2017-02-06 (×3): via INTRAVENOUS

## 2017-02-06 MED ORDER — LISINOPRIL 20 MG PO TABS
20.0000 mg | ORAL_TABLET | Freq: Every day | ORAL | Status: DC
  Administered 2017-02-06 – 2017-02-11 (×5): 20 mg via ORAL
  Filled 2017-02-06 (×6): qty 1

## 2017-02-06 SURGICAL SUPPLY — 76 items
APPLIER CLIP 5 13 M/L LIGAMAX5 (MISCELLANEOUS)
BLADE EXTENDED COATED 6.5IN (ELECTRODE) ×2 IMPLANT
CABLE HIGH FREQUENCY MONO STRZ (ELECTRODE) ×2 IMPLANT
CELLS DAT CNTRL 66122 CELL SVR (MISCELLANEOUS) ×1 IMPLANT
CHLORAPREP W/TINT 26ML (MISCELLANEOUS) ×2 IMPLANT
CLIP APPLIE 5 13 M/L LIGAMAX5 (MISCELLANEOUS) IMPLANT
COVER SURGICAL LIGHT HANDLE (MISCELLANEOUS) ×4 IMPLANT
DECANTER SPIKE VIAL GLASS SM (MISCELLANEOUS) ×2 IMPLANT
DERMABOND ADVANCED (GAUZE/BANDAGES/DRESSINGS) ×1
DERMABOND ADVANCED .7 DNX12 (GAUZE/BANDAGES/DRESSINGS) ×1 IMPLANT
DRAIN CHANNEL 19F RND (DRAIN) IMPLANT
DRAPE LAPAROSCOPIC ABDOMINAL (DRAPES) IMPLANT
DRAPE SURG IRRIG POUCH 19X23 (DRAPES) ×2 IMPLANT
DRSG OPSITE POSTOP 4X10 (GAUZE/BANDAGES/DRESSINGS) IMPLANT
DRSG OPSITE POSTOP 4X6 (GAUZE/BANDAGES/DRESSINGS) IMPLANT
DRSG OPSITE POSTOP 4X8 (GAUZE/BANDAGES/DRESSINGS) ×2 IMPLANT
DRSG TELFA 3X8 NADH (GAUZE/BANDAGES/DRESSINGS) ×2 IMPLANT
ELECT PENCIL ROCKER SW 15FT (MISCELLANEOUS) ×2 IMPLANT
ELECT REM PT RETURN 15FT ADLT (MISCELLANEOUS) ×2 IMPLANT
EVACUATOR SILICONE 100CC (DRAIN) IMPLANT
GAUZE SPONGE 4X4 12PLY STRL (GAUZE/BANDAGES/DRESSINGS) ×2 IMPLANT
GLOVE BIO SURGEON STRL SZ 6.5 (GLOVE) ×4 IMPLANT
GLOVE BIOGEL PI IND STRL 7.0 (GLOVE) ×2 IMPLANT
GLOVE BIOGEL PI INDICATOR 7.0 (GLOVE) ×2
GOWN STRL REUS W/TWL 2XL LVL3 (GOWN DISPOSABLE) ×4 IMPLANT
GOWN STRL REUS W/TWL XL LVL3 (GOWN DISPOSABLE) ×6 IMPLANT
GRASPER ENDOPATH ANVIL 10MM (MISCELLANEOUS) IMPLANT
HOLDER FOLEY CATH W/STRAP (MISCELLANEOUS) ×2 IMPLANT
IRRIG SUCT STRYKERFLOW 2 WTIP (MISCELLANEOUS) ×2
IRRIGATION SUCT STRKRFLW 2 WTP (MISCELLANEOUS) ×1 IMPLANT
LUBRICANT JELLY K Y 4OZ (MISCELLANEOUS) ×2 IMPLANT
NEEDLE INSUFFLATION 14GA 120MM (NEEDLE) ×2 IMPLANT
NS IRRIG 1000ML POUR BTL (IV SOLUTION) ×4 IMPLANT
PACK COLON (CUSTOM PROCEDURE TRAY) ×2 IMPLANT
PAD POSITIONING PINK XL (MISCELLANEOUS) ×2 IMPLANT
PORT LAP GEL ALEXIS MED 5-9CM (MISCELLANEOUS) ×2 IMPLANT
POSITIONER SURGICAL ARM (MISCELLANEOUS) ×2 IMPLANT
RELOAD PROXIMATE 75MM BLUE (ENDOMECHANICALS) ×4 IMPLANT
RTRCTR WOUND ALEXIS 18CM MED (MISCELLANEOUS) ×2
SCISSORS LAP 5X35 DISP (ENDOMECHANICALS) ×2 IMPLANT
SEALER TISSUE G2 STRG ARTC 35C (ENDOMECHANICALS) ×2 IMPLANT
SEALER TISSUE X1 CVD JAW (INSTRUMENTS) IMPLANT
SLEEVE XCEL OPT CAN 5 100 (ENDOMECHANICALS) ×6 IMPLANT
SOL PREP PROV IODINE SCRUB 4OZ (MISCELLANEOUS) ×2 IMPLANT
SPONGE DRAIN TRACH 4X4 STRL 2S (GAUZE/BANDAGES/DRESSINGS) IMPLANT
SPONGE LAP 18X18 X RAY DECT (DISPOSABLE) ×8 IMPLANT
STAPLER CIRC CVD 29MM 37CM (STAPLE) ×2 IMPLANT
STAPLER CUT CVD 40MM GREEN (STAPLE) ×2 IMPLANT
STAPLER GUN LINEAR PROX 60 (STAPLE) ×2 IMPLANT
STAPLER PROXIMATE 75MM BLUE (STAPLE) ×2 IMPLANT
STAPLER VISISTAT 35W (STAPLE) ×2 IMPLANT
SUT ETHILON 2 0 PS N (SUTURE) IMPLANT
SUT NOVA NAB DX-16 0-1 5-0 T12 (SUTURE) ×4 IMPLANT
SUT NOVA NAB GS-21 0 18 T12 DT (SUTURE) IMPLANT
SUT PDS AB 1 CTX 36 (SUTURE) ×4 IMPLANT
SUT PDS AB 1 TP1 96 (SUTURE) IMPLANT
SUT PROLENE 2 0 KS (SUTURE) ×2 IMPLANT
SUT SILK 2 0 (SUTURE) ×1
SUT SILK 2 0 SH CR/8 (SUTURE) ×4 IMPLANT
SUT SILK 2-0 18XBRD TIE 12 (SUTURE) ×1 IMPLANT
SUT SILK 3 0 (SUTURE) ×1
SUT SILK 3 0 SH CR/8 (SUTURE) ×4 IMPLANT
SUT SILK 3-0 18XBRD TIE 12 (SUTURE) ×1 IMPLANT
SUT VIC AB 2-0 SH 18 (SUTURE) ×2 IMPLANT
SUT VIC AB 2-0 SH 27 (SUTURE) ×3
SUT VIC AB 2-0 SH 27X BRD (SUTURE) ×3 IMPLANT
SUT VIC AB 4-0 PS2 27 (SUTURE) ×2 IMPLANT
SYS LAPSCP GELPORT 120MM (MISCELLANEOUS)
SYSTEM LAPSCP GELPORT 120MM (MISCELLANEOUS) IMPLANT
TOWEL OR NON WOVEN STRL DISP B (DISPOSABLE) ×2 IMPLANT
TRAY FOLEY CATH 14FR (SET/KITS/TRAYS/PACK) ×2 IMPLANT
TRAY FOLEY W/METER SILVER 16FR (SET/KITS/TRAYS/PACK) IMPLANT
TROCAR BLADELESS OPT 5 100 (ENDOMECHANICALS) ×2 IMPLANT
TROCAR XCEL BLUNT TIP 100MML (ENDOMECHANICALS) ×2 IMPLANT
TUBING CONNECTING 10 (TUBING) ×2 IMPLANT
TUBING INSUF HEATED (TUBING) ×2 IMPLANT

## 2017-02-06 NOTE — Op Note (Signed)
02/06/2017  2:44 PM  PATIENT:  [name]  61 y.o. female  Patient Care Team: [name], MD as PCP - General (Internal Medicine) Serena Colonel, RN as [place] Management  PRE-OPERATIVE DIAGNOSIS:  diverticulitis disease  POST-OPERATIVE DIAGNOSIS:  diverticulitis disease  PROCEDURE:   LAPAROSCOPIC CONVERTED OPEN COLOSTOMY REVERSAL WITH SMALL BOWEL RESECTION    Surgeon(s): Leighton Ruff, MD Ralene Ok, MD  ASSISTANT: Dr Rosendo Gros   ANESTHESIA:   local and general  EBL: 48ml  Total I/O In: 2500 [I.V.:2500] Out: 63 [Urine:650; Blood:400]  Delay start of Pharmacological VTE agent (>24hrs) due to surgical blood loss or risk of bleeding:  no  DRAINS: none   SPECIMEN:  Source of Specimen:  Colostomy, distal sigmoid, small bowel  DISPOSITION OF SPECIMEN:  PATHOLOGY  COUNTS:  YES  PLAN OF CARE: Admit to inpatient   PATIENT DISPOSITION:  PACU - hemodynamically stable.  INDICATION:    61 year old female status post Hartmann procedure approximately 6 months ago for diverticulitis and colon obstruction.  This required significant retroperitoneal dissection and small bowel resection x2.  She is now here for colostomy reversal.  Colonoscopy shows no other lesions in the colon.   The anatomy & physiology of the digestive tract was discussed.  The pathophysiology was discussed.  Natural history risks without surgery was discussed.   I worked to give an overview of the disease and the frequent need to have multispecialty involvement.  I feel the risks of no intervention will lead to serious problems that outweigh the operative risks; therefore, I recommended a partial colectomy to remove the pathology.  Laparoscopic & open techniques were discussed.   Risks such as bleeding, infection, abscess, leak, reoperation, possible ostomy, hernia, heart attack, death, and other risks were discussed.  I noted a good likelihood this will help address the  problem.   Goals of post-operative recovery were discussed as well.    The patient expressed understanding & wished to proceed with surgery.  OR FINDINGS:   Patient had significant left lower quadrant  left lower quadrant adhesions     The anastomosis rests 17 cm from the anal verge by rigid proctoscopy.  DESCRIPTION:   Informed consent was confirmed.  The patient underwent general anaesthesia without difficulty.  The patient was positioned appropriately.  VTE prevention in place.  The patient's abdomen was clipped, prepped, & draped in a sterile fashion.  Surgical timeout confirmed our plan.  The patient was positioned in reverse Trendelenburg.  Abdominal entry was gained using a varies needle in the left upper quadrant.   I induced carbon dioxide insufflation. I then placed a 5 mm port in the right upper quadrant.   Entry was clean.  Camera inspection revealed no injury.  Extra ports were carefully placed under direct laparoscopic visualization.  The omentum was bluntly dissected from the abdominal wall.     I reflected the greater omentum and the upper abdomen the small bowel and began to lyse adhesions in the pelvis and LLQ. I was able to get the cecum and omentum out of the pelvis.  There was several loops of small bowel that were densely adherent to the left lower quadrant There were several loops of small bowel that were densely adherent to the left lower quadrant.  I was not able to visualize the safely enough to resect them laparoscopically. I was not able to visualize these safely enough to disect them laparoscopically.  After approximately 30 minutes of lysis of adhesion,  I decided to convert to an open procedure to be able to visualize these better.  A lower midline incision was created through the previous scar.  Dissection was carried down through the level of the fascia using electrocautery.  A Balfour retractor was placed.  I slowly began to dissect the small bowel away from the  left lower quadrant.  There was a portion of small bowel mesentery that was adherent densely to the area of the pelvis where the ureter would like.  I could not identify the ureter definitely but it appeared to be a tubular structure inferior to this.  I decided to resect the portion of small bowel and leave the mesentery overlying this area.  This was done with a blue load GIA stapler.  An anastomosis was created also using the blue load 75 mm GIA stapler.  The common enterotomy was closed using a blue load TA stapler.  An anti-tension suture was placed at the crotch of the anastomosis as well.  This was then placed back into the abdomen.  All of the small bowel was retracted out of the pelvis.  We dissected out the distal sigmoid and proximal rectum.  These were separated from the pelvic sidewall using blunt dissection.   A rectal dilator was placed into the rectum to identify the pelvic structures definitively.  Once the rectal stump was free and we were able to advance the dilator to the staple line, I began to dissect out the colostomy making a circumferential incision using electrocautery.  Blunt dissection was used to separate the subcutaneous tissue from the colostomy.  The fascia was then entered and the remaining hernia sac was divided away.  The colostomy was then brought down through the wound and placed into the pelvis.  It appeared to be well perfused.  A pursestring her device was placed on the end of the close colostomy and the end was resected using electrocautery.  A 2-0 Prolene pursestring suture was placed and secured with 3-0 silk sutures.  A 29 mm EEA anvil was then placed into the distal colon and the pursestring was tied tightly around this.  The EEA was then introduced into the rectal stump and brought out through the staple line slightly posterior. An anastomosis was created without tension. There was no leak when tested with insufflation under water. The abdomen was irrigated well.  Hemostasis was good.    We then switched to clean gowns, gloves, instruments and drapes.  The colostomy site was closed using interrupted #1 Novafil sutures horizontally.  The midline incision was closed using a running #1 PDS suture x2.  The subcutaneous tissue of the colostomy was then reapproximated using a 2-0 Vicryl pursestring suture.  The dermal layer was also closed with a pursestring suture and a Telfa sponge was placed in the middle.  3 Telfa wicks were placed in the midline incision and a 2-0 Vicryl suture was used to reapproximate the subcutaneous tissue.  The skin was then closed with staples.  Sterile dressings were applied.  The patient was awakened from anesthesia and sent to the postanesthesia care unit in stable condition.  All counts were correct per operating room staff.

## 2017-02-06 NOTE — Anesthesia Procedure Notes (Signed)
Procedure Name: Intubation Date/Time: 02/06/2017 10:59 AM Performed by: Talbot Grumbling, CRNA Pre-anesthesia Checklist: Patient identified, Emergency Drugs available, Suction available and Patient being monitored Patient Re-evaluated:Patient Re-evaluated prior to induction Oxygen Delivery Method: Circle system utilized Preoxygenation: Pre-oxygenation with 100% oxygen Induction Type: IV induction Ventilation: Mask ventilation without difficulty Laryngoscope Size: Mac and 3 Grade View: Grade II Tube type: Oral Tube size: 7.5 mm Number of attempts: 1 Airway Equipment and Method: Stylet Placement Confirmation: ETT inserted through vocal cords under direct vision,  positive ETCO2 and breath sounds checked- equal and bilateral Secured at: 21 cm Tube secured with: Tape Dental Injury: Teeth and Oropharynx as per pre-operative assessment

## 2017-02-06 NOTE — Anesthesia Postprocedure Evaluation (Signed)
Anesthesia Post Note  Patient: [name]  Procedure(s) Performed: LAPAROSCOPIC CONVERTED OPEN COLOSTOMY REVISION WITH SMALL BOWEL RESECTION (N/A )     Patient location during evaluation: PACU Anesthesia Type: General Level of consciousness: awake and alert Pain management: pain level controlled Vital Signs Assessment: post-procedure vital signs reviewed and stable Respiratory status: spontaneous breathing, nonlabored ventilation, respiratory function stable and patient connected to nasal cannula oxygen Cardiovascular status: blood pressure returned to baseline and stable Postop Assessment: no apparent nausea or vomiting Anesthetic complications: no    Last Vitals:  Vitals:   02/06/17 1630 02/06/17 1728  BP:  139/73  Pulse: 87 78  Resp: 16 17  Temp: 36.9 C 37.1 C  SpO2: 100% 100%    Last Pain:  Vitals:   02/06/17 1728  TempSrc: Oral  PainSc:                  Oneisha Ammons

## 2017-02-06 NOTE — Interval H&P Note (Signed)
History and Physical Interval Note:  02/06/2017 10:46 AM  [name]  has presented today for surgery, with the diagnosis of diverticulitis disease  The various methods of treatment have been discussed with the patient and family. After consideration of risks, benefits and other options for treatment, the patient has consented to  Procedure(s): [place] (N/A) as a surgical intervention .  The patient's history has been reviewed, patient examined, no change in status, stable for surgery.  I have reviewed the patient's chart and labs.  Questions were answered to the patient's satisfaction.     Debara Kamphuis C.

## 2017-02-06 NOTE — Anesthesia Preprocedure Evaluation (Signed)
Anesthesia Evaluation  Patient identified by MRN, date of birth, ID band Patient awake    Reviewed: Allergy & Precautions, NPO status , Patient's Chart, lab work & pertinent test results  Airway Mallampati: II  TM Distance: >3 FB Neck ROM: Full    Dental no notable dental hx.    Pulmonary neg pulmonary ROS,    Pulmonary exam normal breath sounds clear to auscultation       Cardiovascular hypertension, + Peripheral Vascular Disease and + DOE  Normal cardiovascular exam Rhythm:Regular Rate:Normal     Neuro/Psych negative neurological ROS  negative psych ROS   GI/Hepatic negative GI ROS, Neg liver ROS,   Endo/Other  Hypothyroidism   Renal/GU negative Renal ROS     Musculoskeletal  (+) Arthritis ,   Abdominal   Peds  Hematology  (+) anemia ,   Anesthesia Other Findings   Reproductive/Obstetrics negative OB ROS                             Anesthesia Physical  Anesthesia Plan  ASA: III  Anesthesia Plan: General   Post-op Pain Management:    Induction: Intravenous  PONV Risk Score and Plan: 4 or greater and Ondansetron, Dexamethasone, Midazolam and Scopolamine patch - Pre-op  Airway Management Planned: Oral ETT  Additional Equipment:   Intra-op Plan:   Post-operative Plan: Extubation in OR  Informed Consent: I have reviewed the patients History and Physical, chart, labs and discussed the procedure including the risks, benefits and alternatives for the proposed anesthesia with the patient or authorized representative who has indicated his/her understanding and acceptance.   Dental advisory given  Plan Discussed with: CRNA  Anesthesia Plan Comments:         Anesthesia Quick Evaluation

## 2017-02-06 NOTE — Transfer of Care (Signed)
Immediate Anesthesia Transfer of Care Note  Patient: [name]  Procedure(s) Performed: LAPAROSCOPIC CONVERTED OPEN COLOSTOMY REVISION WITH SMALL BOWEL RESECTION (N/A )  Patient Location: PACU  Anesthesia Type:General  Level of Consciousness: awake, alert  and oriented  Airway & Oxygen Therapy: Patient Spontanous Breathing and Patient connected to face mask oxygen  Post-op Assessment: Report given to RN  Post vital signs: Reviewed and stable  Last Vitals:  Vitals:   02/06/17 0844 02/06/17 1500  BP: (!) 159/71   Pulse: 95   Resp: 18   Temp: 36.6 C 36.9 C  SpO2: 100%     Last Pain:  Vitals:   02/06/17 1500  TempSrc:   PainSc: 0-No pain      Patients Stated Pain Goal: 4 (83/15/17 6160)  Complications: No apparent anesthesia complications

## 2017-02-07 ENCOUNTER — Encounter (HOSPITAL_COMMUNITY): Payer: Self-pay | Admitting: General Surgery

## 2017-02-07 LAB — BASIC METABOLIC PANEL
ANION GAP: 6 (ref 5–15)
BUN: 16 mg/dL (ref 6–20)
CHLORIDE: 106 mmol/L (ref 101–111)
CO2: 24 mmol/L (ref 22–32)
CREATININE: 1.16 mg/dL — AB (ref 0.44–1.00)
Calcium: 7.9 mg/dL — ABNORMAL LOW (ref 8.9–10.3)
GFR calc non Af Amer: 49 mL/min — ABNORMAL LOW (ref >60)
GFR, EST AFRICAN AMERICAN: 57 mL/min — AB (ref >60)
Glucose, Bld: 145 mg/dL — ABNORMAL HIGH (ref 65–99)
Potassium: 3.8 mmol/L (ref 3.5–5.1)
Sodium: 136 mmol/L (ref 135–145)

## 2017-02-07 LAB — CBC
HEMATOCRIT: 31.1 % — AB (ref 36.0–46.0)
HEMOGLOBIN: 10.2 g/dL — AB (ref 12.0–15.0)
MCH: 27.9 pg (ref 26.0–34.0)
MCHC: 32.8 g/dL (ref 30.0–36.0)
MCV: 85.2 fL (ref 78.0–100.0)
Platelets: 304 10*3/uL (ref 150–400)
RBC: 3.65 MIL/uL — AB (ref 3.87–5.11)
RDW: 14.6 % (ref 11.5–15.5)
WBC: 18.6 10*3/uL — ABNORMAL HIGH (ref 4.0–10.5)

## 2017-02-07 MED ORDER — KCL IN DEXTROSE-NACL 20-5-0.45 MEQ/L-%-% IV SOLN
INTRAVENOUS | Status: DC
  Administered 2017-02-07: 75 mL/h via INTRAVENOUS
  Administered 2017-02-07: 1000 mL via INTRAVENOUS
  Administered 2017-02-07: 75 mL/h via INTRAVENOUS
  Administered 2017-02-09 – 2017-02-10 (×3): via INTRAVENOUS
  Filled 2017-02-07 (×6): qty 1000

## 2017-02-07 NOTE — Progress Notes (Signed)
1 Day Post-Op Lap converted to open colostomy reversal Subjective: Doing well.  Denies any pain.  Slept well  Objective: Vital signs in last 24 hours: Temp:  [97.5 F (36.4 C)-98.9 F (37.2 C)] 97.5 F (36.4 C) (12/27 0505) Pulse Rate:  [76-109] 102 (12/27 0505) Resp:  [14-19] 14 (12/27 0505) BP: (100-141)/(45-77) 100/45 (12/27 0505) SpO2:  [95 %-100 %] 95 % (12/27 0505)   Intake/Output from previous day: 12/26 0701 - 12/27 0700 In: 3495 [P.O.:20; I.V.:3475] Out: 2775 [Urine:1525; Emesis/NG output:850; Blood:400] Intake/Output this shift: No intake/output data recorded.   General appearance: alert and cooperative GI: normal findings: soft, non-distended  Incision: no significant drainage  Lab Results:  Recent Labs    02/07/17 0448  WBC 18.6*  HGB 10.2*  HCT 31.1*  PLT 304   BMET Recent Labs    02/07/17 0448  NA 136  K 3.8  CL 106  CO2 24  GLUCOSE 145*  BUN 16  CREATININE 1.16*  CALCIUM 7.9*   PT/INR No results for input(s): LABPROT, INR in the last 72 hours. ABG No results for input(s): PHART, HCO3 in the last 72 hours.  Invalid input(s): PCO2, PO2  MEDS, Scheduled . acetaminophen  1,000 mg Oral Q6H  . alvimopan  12 mg Oral BID  . enoxaparin (LOVENOX) injection  40 mg Subcutaneous Q24H  . levothyroxine  112 mcg Oral QAC breakfast  . lisinopril  20 mg Oral Daily  . saccharomyces boulardii  250 mg Oral BID    Studies/Results: No results found.  Assessment: s/p Procedure(s): LAPAROSCOPIC CONVERTED OPEN COLOSTOMY REVISION WITH SMALL BOWEL RESECTION Patient Active Problem List   Diagnosis Date Noted  . History of colostomy reversal 02/06/2017  . 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 Hypokalemia 07/06/2016  . Lower extremities edema 07/06/2016  . SBO (small bowel obstruction) ([place]) 07/05/2016  . 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 Hypothyroidism 07/05/2016  . Hypertension 07/05/2016  . Pre-diabetes 07/05/2016    Expected post op course.  Expected post op ileus  Plan: Ambulate  Cont NG and IVF's D/c foley in AM Change LLQ dressing daily   LOS: 1 day     .Rosario Adie, [place] [facility], [place]   02/07/2017 9:25 AM

## 2017-02-08 LAB — BASIC METABOLIC PANEL
Anion gap: 6 (ref 5–15)
BUN: 19 mg/dL (ref 6–20)
CHLORIDE: 102 mmol/L (ref 101–111)
CO2: 27 mmol/L (ref 22–32)
Calcium: 8.2 mg/dL — ABNORMAL LOW (ref 8.9–10.3)
Creatinine, Ser: 1.05 mg/dL — ABNORMAL HIGH (ref 0.44–1.00)
GFR calc Af Amer: >60 mL/min (ref >60)
GFR calc non Af Amer: 55 mL/min — ABNORMAL LOW (ref >60)
GLUCOSE: 119 mg/dL — AB (ref 65–99)
POTASSIUM: 3.7 mmol/L (ref 3.5–5.1)
Sodium: 135 mmol/L (ref 135–145)

## 2017-02-08 LAB — CBC
HEMATOCRIT: 30 % — AB (ref 36.0–46.0)
Hemoglobin: 9.8 g/dL — ABNORMAL LOW (ref 12.0–15.0)
MCH: 27.9 pg (ref 26.0–34.0)
MCHC: 32.7 g/dL (ref 30.0–36.0)
MCV: 85.5 fL (ref 78.0–100.0)
Platelets: 270 10*3/uL (ref 150–400)
RBC: 3.51 MIL/uL — ABNORMAL LOW (ref 3.87–5.11)
RDW: 15.1 % (ref 11.5–15.5)
WBC: 15.5 10*3/uL — ABNORMAL HIGH (ref 4.0–10.5)

## 2017-02-08 NOTE — Progress Notes (Signed)
2 Days Post-Op Lap converted to open colostomy reversal Subjective: Doing well.  Pain controlled.  Ambulated in hall  Objective: Vital signs in last 24 hours: Temp:  [98.3 F (36.8 C)-98.8 F (37.1 C)] 98.8 F (37.1 C) (12/28 0551) Pulse Rate:  [88-100] 94 (12/28 0551) Resp:  [16-18] 18 (12/28 0551) BP: (105-121)/(47-53) 121/53 (12/28 0551) SpO2:  [96 %-99 %] 96 % (12/28 0551)   Intake/Output from previous day: 12/27 0701 - 12/28 0700 In: 2243.8 [I.V.:2243.8] Out: 2100 [Urine:900; Emesis/NG output:1200] Intake/Output this shift: No intake/output data recorded.   General appearance: alert and cooperative GI: normal findings: soft, non-distended  Incision: no significant drainage  Lab Results:  Recent Labs    02/07/17 0448 02/08/17 0508  WBC 18.6* 15.5*  HGB 10.2* 9.8*  HCT 31.1* 30.0*  PLT 304 270   BMET Recent Labs    02/07/17 0448 02/08/17 0508  NA 136 135  K 3.8 3.7  CL 106 102  CO2 24 27  GLUCOSE 145* 119*  BUN 16 19  CREATININE 1.16* 1.05*  CALCIUM 7.9* 8.2*   PT/INR No results for input(s): LABPROT, INR in the last 72 hours. ABG No results for input(s): PHART, HCO3 in the last 72 hours.  Invalid input(s): PCO2, PO2  MEDS, Scheduled . acetaminophen  1,000 mg Oral Q6H  . alvimopan  12 mg Oral BID  . enoxaparin (LOVENOX) injection  40 mg Subcutaneous Q24H  . levothyroxine  112 mcg Oral QAC breakfast  . lisinopril  20 mg Oral Daily  . saccharomyces boulardii  250 mg Oral BID    Studies/Results: No results found.  Assessment: s/p Procedure(s): LAPAROSCOPIC CONVERTED OPEN COLOSTOMY REVISION WITH SMALL BOWEL RESECTION Patient Active Problem List   Diagnosis Date Noted  . History of colostomy reversal 02/06/2017  . 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 Hypokalemia 07/06/2016  . Lower extremities edema 07/06/2016   . SBO (small bowel obstruction) ([place]) 07/05/2016  . 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 Hypothyroidism 07/05/2016  . Hypertension 07/05/2016  . Pre-diabetes 07/05/2016    Expected post op course.  Expected post op ileus  Plan: Ambulate  Cont NG and IVF's D/c foley  Change LLQ dressing daily   LOS: 2 days     .Rosario Adie, MD [facility], [place]   02/08/2017 8:21 AM

## 2017-02-09 LAB — CBC
HEMATOCRIT: 27.7 % — AB (ref 36.0–46.0)
HEMOGLOBIN: 9.2 g/dL — AB (ref 12.0–15.0)
MCH: 28.4 pg (ref 26.0–34.0)
MCHC: 33.2 g/dL (ref 30.0–36.0)
MCV: 85.5 fL (ref 78.0–100.0)
Platelets: 290 10*3/uL (ref 150–400)
RBC: 3.24 MIL/uL — AB (ref 3.87–5.11)
RDW: 15 % (ref 11.5–15.5)
WBC: 13 10*3/uL — ABNORMAL HIGH (ref 4.0–10.5)

## 2017-02-09 LAB — BASIC METABOLIC PANEL
ANION GAP: 6 (ref 5–15)
BUN: 14 mg/dL (ref 6–20)
CO2: 26 mmol/L (ref 22–32)
Calcium: 8 mg/dL — ABNORMAL LOW (ref 8.9–10.3)
Chloride: 103 mmol/L (ref 101–111)
Creatinine, Ser: 0.93 mg/dL (ref 0.44–1.00)
GFR calc Af Amer: >60 mL/min (ref >60)
GLUCOSE: 107 mg/dL — AB (ref 65–99)
POTASSIUM: 3.6 mmol/L (ref 3.5–5.1)
Sodium: 135 mmol/L (ref 135–145)

## 2017-02-09 MED ORDER — LIP MEDEX EX OINT
TOPICAL_OINTMENT | CUTANEOUS | Status: AC
  Filled 2017-02-09: qty 7

## 2017-02-09 NOTE — Progress Notes (Signed)
3 Days Post-Op Lap converted to open colostomy reversal Subjective: Doing well.  Pain controlled.  Sat in chair some yesterday.  Having some nausea.    Objective: Vital signs in last 24 hours: Temp:  [98.5 F (36.9 C)-99.1 F (37.3 C)] 98.7 F (37.1 C) (12/29 0626) Pulse Rate:  [90-96] 90 (12/29 0626) Resp:  [16-18] 18 (12/29 0626) BP: (122-139)/(63-78) 122/78 (12/29 0626) SpO2:  [95 %-96 %] 96 % (12/29 0626)   Intake/Output from previous day: 12/28 0701 - 12/29 0700 In: 1930 [P.O.:120; I.V.:1810] Out: 2750 [Urine:500; Emesis/NG output:2250] Intake/Output this shift: No intake/output data recorded.   General appearance: alert and cooperative GI: normal findings: soft, non-distended  Incision: no significant drainage  Lab Results:  Recent Labs    02/08/17 0508 02/09/17 0521  WBC 15.5* 13.0*  HGB 9.8* 9.2*  HCT 30.0* 27.7*  PLT 270 290   BMET Recent Labs    02/08/17 0508 02/09/17 0521  NA 135 135  K 3.7 3.6  CL 102 103  CO2 27 26  GLUCOSE 119* 107*  BUN 19 14  CREATININE 1.05* 0.93  CALCIUM 8.2* 8.0*   PT/INR No results for input(s): LABPROT, INR in the last 72 hours. ABG No results for input(s): PHART, HCO3 in the last 72 hours.  Invalid input(s): PCO2, PO2  MEDS, Scheduled . acetaminophen  1,000 mg Oral Q6H  . enoxaparin (LOVENOX) injection  40 mg Subcutaneous Q24H  . levothyroxine  112 mcg Oral QAC breakfast  . lisinopril  20 mg Oral Daily  . saccharomyces boulardii  250 mg Oral BID    Studies/Results: No results found.  Assessment: s/p Procedure(s): LAPAROSCOPIC CONVERTED OPEN COLOSTOMY REVISION WITH SMALL BOWEL RESECTION Patient Active Problem List   Diagnosis Date Noted  . History of colostomy reversal 02/06/2017  . 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 Hypokalemia 07/06/2016  . Lower extremities edema  07/06/2016  . SBO (small bowel obstruction) ([place]) 07/05/2016  . 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 Hypothyroidism 07/05/2016  . Hypertension 07/05/2016  . Pre-diabetes 07/05/2016    Expected post op course.  Expected post op ileus  Plan: Ambulate  Cont NG and IVF's Monitor UOP Change LLQ dressing daily   LOS: 3 days     .Rosario Adie, [place] [facility], [place]   02/09/2017 9:11 AM

## 2017-02-10 LAB — CBC
HEMATOCRIT: 27.1 % — AB (ref 36.0–46.0)
HEMOGLOBIN: 8.6 g/dL — AB (ref 12.0–15.0)
MCH: 27 pg (ref 26.0–34.0)
MCHC: 31.7 g/dL (ref 30.0–36.0)
MCV: 85.2 fL (ref 78.0–100.0)
Platelets: 321 10*3/uL (ref 150–400)
RBC: 3.18 MIL/uL — AB (ref 3.87–5.11)
RDW: 14.7 % (ref 11.5–15.5)
WBC: 8.8 10*3/uL (ref 4.0–10.5)

## 2017-02-10 LAB — BASIC METABOLIC PANEL
ANION GAP: 7 (ref 5–15)
BUN: 13 mg/dL (ref 6–20)
CHLORIDE: 103 mmol/L (ref 101–111)
CO2: 25 mmol/L (ref 22–32)
Calcium: 7.8 mg/dL — ABNORMAL LOW (ref 8.9–10.3)
Creatinine, Ser: 0.81 mg/dL (ref 0.44–1.00)
GFR calc Af Amer: >60 mL/min (ref >60)
GFR calc non Af Amer: >60 mL/min (ref >60)
GLUCOSE: 103 mg/dL — AB (ref 65–99)
POTASSIUM: 3.7 mmol/L (ref 3.5–5.1)
Sodium: 135 mmol/L (ref 135–145)

## 2017-02-10 NOTE — Progress Notes (Signed)
4 Days Post-Op Lap converted to open colostomy reversal Subjective: Doing well.  Pain controlled.  Ambulated well yesterday.  Had several BM's  Objective: Vital signs in last 24 hours: Temp:  [97.8 F (36.6 C)-98.7 F (37.1 C)] 98.4 F (36.9 C) (12/30 0530) Pulse Rate:  [83-84] 83 (12/30 0530) Resp:  [18] 18 (12/30 0530) BP: (102-148)/(63-86) 126/63 (12/30 0530) SpO2:  [97 %-98 %] 98 % (12/30 0530)   Intake/Output from previous day: 12/29 0701 - 12/30 0700 In: 1801 [P.O.:1; I.V.:1800] Out: 1700 [Urine:500; Emesis/NG output:1000; Stool:200] Intake/Output this shift: No intake/output data recorded.   General appearance: alert and cooperative GI: normal findings: soft, non-distended  Incision: no significant drainage  Lab Results:  Recent Labs    02/09/17 0521 02/10/17 0458  WBC 13.0* 8.8  HGB 9.2* 8.6*  HCT 27.7* 27.1*  PLT 290 321   BMET Recent Labs    02/09/17 0521 02/10/17 0458  NA 135 135  K 3.6 3.7  CL 103 103  CO2 26 25  GLUCOSE 107* 103*  BUN 14 13  CREATININE 0.93 0.81  CALCIUM 8.0* 7.8*   PT/INR No results for input(s): LABPROT, INR in the last 72 hours. ABG No results for input(s): PHART, HCO3 in the last 72 hours.  Invalid input(s): PCO2, PO2  MEDS, Scheduled . acetaminophen  1,000 mg Oral Q6H  . enoxaparin (LOVENOX) injection  40 mg Subcutaneous Q24H  . levothyroxine  112 mcg Oral QAC breakfast  . lisinopril  20 mg Oral Daily  . saccharomyces boulardii  250 mg Oral BID    Studies/Results: No results found.  Assessment: s/p Procedure(s): LAPAROSCOPIC CONVERTED OPEN COLOSTOMY REVISION WITH SMALL BOWEL RESECTION Patient Active Problem List   Diagnosis Date Noted  . History of colostomy reversal 02/06/2017  . 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 Hypokalemia 07/06/2016  . Lower extremities edema 07/06/2016   . SBO (small bowel obstruction) ([place]) 07/05/2016  . 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 Hypothyroidism 07/05/2016  . Hypertension 07/05/2016  . Pre-diabetes 07/05/2016    Expected post op course.  Expected post op ileus  Plan: Ambulate  D/c NG and start clears Monitor UOP Change dressings daily   LOS: 4 days     .Rosario Adie, MD [facility], [place]   02/10/2017 8:21 AM

## 2017-02-10 NOTE — Discharge Instructions (Addendum)
SURGERY: POST OP INSTRUCTIONS °(Surgery for small bowel obstruction, colon resection, etc) ° ° °###################################################################### ° °EAT °Gradually transition to a high fiber diet with a fiber supplement over the next few days after discharge ° °WALK °Walk an hour a day.  Control your pain to do that.   ° °CONTROL PAIN °Control pain so that you can walk, sleep, tolerate sneezing/coughing, go up/down stairs. ° °HAVE A BOWEL MOVEMENT DAILY °Keep your bowels regular to avoid problems.  OK to try a laxative to override constipation.  OK to use an antidairrheal to slow down diarrhea.  Call if not better after 2 tries ° °CALL IF YOU HAVE PROBLEMS/CONCERNS °Call if you are still struggling despite following these instructions. °Call if you have concerns not answered by these instructions ° °###################################################################### ° ° °DIET °Follow a light diet the first few days at home.  Start with a bland diet such as soups, liquids, starchy foods, low fat foods, etc.  If you feel full, bloated, or constipated, stay on a ful liquid or pureed/blenderized diet for a few days until you feel better and no longer constipated. °Be sure to drink plenty of fluids every day to avoid getting dehydrated (feeling dizzy, not urinating, etc.). °Gradually add a fiber supplement to your diet over the next week.  Gradually get back to a regular solid diet.  Avoid fast food or heavy meals the first week as you are more likely to get nauseated. °It is expected for your digestive tract to need a few months to get back to normal.  It is common for your bowel movements and stools to be irregular.  You will have occasional bloating and cramping that should eventually fade away.  Until you are eating solid food normally, off all pain medications, and back to regular activities; your bowels will not be normal. °Focus on eating a low-fat, high fiber diet the rest of your life  (See Getting to Good Bowel Health, below). ° °CARE of your INCISION or WOUND °It is good for closed incision and even open wounds to be washed every day.  Shower every day.  Short baths are fine.  Wash the incisions and wounds clean with soap & water.    °If you have a closed incision(s), wash the incision with soap & water every day.  You may leave closed incisions open to air if it is dry.   You may cover the incision with clean gauze & replace it after your daily shower for comfort. °If you have skin tapes (Steristrips) or skin glue (Dermabond) on your incision, leave them in place.  They will fall off on their own like a scab.  You may trim any edges that curl up with clean scissors.  If you have staples, set up an appointment for them to be removed in the office in 10 days after surgery.  °If you have a drain, wash around the skin exit site with soap & water and place a new dressing of gauze or band aid around the skin every day.  Keep the drain site clean & dry.    °If you have an open wound with packing, see wound care instructions.  In general, it is encouraged that you remove your dressing and packing, shower with soap & water, and replace your dressing once a day.  Pack the wound with clean gauze moistened with normal (0.9%) saline to keep the wound moist & uninfected.  Pressure on the dressing for 30 minutes will stop most wound   bleeding.  Eventually your body will heal & pull the open wound closed over the next few months.  °Raw open wounds will occasionally bleed or secrete yellow drainage until it heals closed.  Drain sites will drain a little until the drain is removed.  Even closed incisions can have mild bleeding or drainage the first few days until the skin edges scab over & seal.   °If you have an open wound with a wound vac, see wound vac care instructions. ° ° ° ° °ACTIVITIES as tolerated °Start light daily activities --- self-care, walking, climbing stairs-- beginning the day after surgery.   Gradually increase activities as tolerated.  Control your pain to be active.  Stop when you are tired.  Ideally, walk several times a day, eventually an hour a day.   °Most people are back to most day-to-day activities in a few weeks.  It takes 4-8 weeks to get back to unrestricted, intense activity. °If you can walk 30 minutes without difficulty, it is safe to try more intense activity such as jogging, treadmill, bicycling, low-impact aerobics, swimming, etc. °Save the most intensive and strenuous activity for last (Usually 4-8 weeks after surgery) such as sit-ups, heavy lifting, contact sports, etc.  Refrain from any intense heavy lifting or straining until you are off narcotics for pain control.  You will have off days, but things should improve week-by-week. °DO NOT PUSH THROUGH PAIN.  Let pain be your guide: If it hurts to do something, don't do it.  Pain is your body warning you to avoid that activity for another week until the pain goes down. °You may drive when you are no longer taking narcotic prescription pain medication, you can comfortably wear a seatbelt, and you can safely make sudden turns/stops to protect yourself without hesitating due to pain. °You may have sexual intercourse when it is comfortable. If it hurts to do something, stop. ° °MEDICATIONS °Take your usually prescribed home medications unless otherwise directed.   °Blood thinners:  °Usually you can restart any strong blood thinners after the second postoperative day.  It is OK to take aspirin right away.    ° If you are on strong blood thinners (warfarin/Coumadin, Plavix, Xerelto, Eliquis, Pradaxa, etc), discuss with your surgeon, medicine PCP, and/or cardiologist for instructions on when to restart the blood thinner & if blood monitoring is needed (PT/INR blood check, etc).   ° ° °PAIN CONTROL °Pain after surgery or related to activity is often due to strain/injury to muscle, tendon, nerves and/or incisions.  This pain is usually  short-term and will improve in a few months.  °To help speed the process of healing and to get back to regular activity more quickly, DO THE FOLLOWING THINGS TOGETHER: °1. Increase activity gradually.  DO NOT PUSH THROUGH PAIN °2. Use Ice and/or Heat °3. Try Gentle Massage and/or Stretching °4. Take over the counter pain medication °5. Take Narcotic prescription pain medication for more severe pain ° °Good pain control = faster recovery.  It is better to take more medicine to be more active than to stay in bed all day to avoid medications. °1.  Increase activity gradually °Avoid heavy lifting at first, then increase to lifting as tolerated over the next 6 weeks. °Do not “push through” the pain.  Listen to your body and avoid positions and maneuvers than reproduce the pain.  Wait a few days before trying something more intense °Walking an hour a day is encouraged to help your body recover faster   and more safely.  Start slowly and stop when getting sore.  If you can walk 30 minutes without stopping or pain, you can try more intense activity (running, jogging, aerobics, cycling, swimming, treadmill, sex, sports, weightlifting, etc.) °Remember: If it hurts to do it, then don’t do it! °2. Use Ice and/or Heat °You will have swelling and bruising around the incisions.  This will take several weeks to resolve. °Ice packs or heating pads (6-8 times a day, 30-60 minutes at a time) will help sooth soreness & bruising. °Some people prefer to use ice alone, heat alone, or alternate between ice & heat.  Experiment and see what works best for you.  Consider trying ice for the first few days to help decrease swelling and bruising; then, switch to heat to help relax sore spots and speed recovery. °Shower every day.  Short baths are fine.  It feels good!  Keep the incisions and wounds clean with soap & water.   °3. Try Gentle Massage and/or Stretching °Massage at the area of pain many times a day °Stop if you feel pain - do not  overdo it °4. Take over the counter pain medication °This helps the muscle and nerve tissues become less irritable and calm down faster °Choose ONE of the following over-the-counter anti-inflammatory medications: °Acetaminophen 500mg tabs (Tylenol) 1-2 pills with every meal and just before bedtime (avoid if you have liver problems or if you have acetaminophen in you narcotic prescription) °Naproxen 220mg tabs (ex. Aleve, Naprosyn) 1-2 pills twice a day (avoid if you have kidney, stomach, IBD, or bleeding problems) °Ibuprofen 200mg tabs (ex. Advil, Motrin) 3-4 pills with every meal and just before bedtime (avoid if you have kidney, stomach, IBD, or bleeding problems) °Take with food/snack several times a day as directed for at least 2 weeks to help keep pain / soreness down & more manageable. °5. Take Narcotic prescription pain medication for more severe pain °A prescription for strong pain control is often given to you upon discharge (for example: oxycodone/Percocet, hydrocodone/Norco/Vicodin, or tramadol/Ultram) °Take your pain medication as prescribed. °Be mindful that most narcotic prescriptions contain Tylenol (acetaminophen) as well - avoid taking too much Tylenol. °If you are having problems/concerns with the prescription medicine (does not control pain, nausea, vomiting, rash, itching, etc.), please call us [phone] to see if we need to switch you to a different pain medicine that will work better for you and/or control your side effects better. °If you need a refill on your pain medication, you must call the office before 4 pm and on weekdays only.  By federal law, prescriptions for narcotics cannot be called into a pharmacy.  They must be filled out on paper & picked up from our office by the patient or authorized caretaker.  Prescriptions cannot be filled after 4 pm nor on weekends.   ° °WHEN TO CALL US [phone] °Severe uncontrolled or worsening pain  °Fever over 101 F (38.5 C) °Concerns with  the incision: Worsening pain, redness, rash/hives, swelling, bleeding, or drainage °Reactions / problems with new medications (itching, rash, hives, nausea, etc.) °Nausea and/or vomiting °Difficulty urinating °Difficulty breathing °Worsening fatigue, dizziness, lightheadedness, blurred vision °Other concerns °If you are not getting better after two weeks or are noticing you are getting worse, contact our office [phone] for further advice.  We may need to adjust your medications, re-evaluate you in the office, send you to the emergency room, or see what other things we can do to help. °The   clinic staff is available to answer your questions during regular business hours (8:30am-5pm).  Please don’t hesitate to call and ask to speak to one of our nurses for clinical concerns.    °A surgeon from [facility] is always on call at the hospitals 24 hours/day °If you have a medical emergency, go to the nearest emergency room or call 911. ° °FOLLOW UP in our office °One the day of your discharge from the hospital (or the next business weekday), please call [facility] to set up or confirm an appointment to see your surgeon in the office for a follow-up appointment.  Usually it is 2-3 weeks after your surgery.   °If you have skin staples at your incision(s), let the office know so we can set up a time in the office for the nurse to remove them (usually around 10 days after surgery). °Make sure that you call for appointments the day of discharge (or the next business weekday) from the hospital to ensure a convenient appointment time. °IF YOU HAVE DISABILITY OR FAMILY LEAVE FORMS, BRING THEM TO THE OFFICE FOR PROCESSING.  DO NOT GIVE THEM TO YOUR DOCTOR. ° °[facility] °[address], [address], [place],   [address] ? °[phone] - Main °[phone] - Toll Free,  [phone] - Fax °[email or web address] ° °GETTING TO GOOD BOWEL HEALTH. °It is  expected for your digestive tract to need a few months to get back to normal.  It is common for your bowel movements and stools to be irregular.  You will have occasional bloating and cramping that should eventually fade away.  Until you are eating solid food normally, off all pain medications, and back to regular activities; your bowels will not be normal.   °Avoiding constipation °The goal: ONE SOFT BOWEL MOVEMENT A DAY!    °Drink plenty of fluids.  Choose water first. °TAKE A FIBER SUPPLEMENT EVERY DAY THE REST OF YOUR LIFE °During your first week back home, gradually add back a fiber supplement every day °Experiment which form you can tolerate.   There are many forms such as powders, tablets, wafers, gummies, etc °Psyllium bran (Metamucil), methylcellulose (Citrucel), Miralax or Glycolax, Benefiber, Flax Seed.  °Adjust the dose week-by-week (1/2 dose/day to 6 doses a day) until you are moving your bowels 1-2 times a day.  Cut back the dose or try a different fiber product if it is giving you problems such as diarrhea or bloating. °Sometimes a laxative is needed to help jump-start bowels if constipated until the fiber supplement can help regulate your bowels.  If you are tolerating eating & you are farting, it is okay to try a gentle laxative such as double dose MiraLax, prune juice, or Milk of Magnesia.  Avoid using laxatives too often. °Stool softeners can sometimes help counteract the constipating effects of narcotic pain medicines.  It can also cause diarrhea, so avoid using for too long. °If you are still constipated despite taking fiber daily, eating solids, and a few doses of laxatives, call our office. °Controlling diarrhea °Try drinking liquids and eating bland foods for a few days to avoid stressing your intestines further. °Avoid dairy products (especially milk & ice cream) for a short time.  The intestines often can lose the ability to digest lactose when stressed. °Avoid foods that cause gassiness or  bloating.  Typical foods include beans and other legumes, cabbage, broccoli, and dairy foods.  Avoid greasy, spicy, fast foods.  Every person has   some sensitivity to other foods, so listen to your body and avoid those foods that trigger problems for you. Probiotics (such as active yogurt, Align, etc) may help repopulate the intestines and colon with normal bacteria and calm down a sensitive digestive tract Adding a fiber supplement gradually can help thicken stools by absorbing excess fluid and retrain the intestines to act more normally.  Slowly increase the dose over a few weeks.  Too much fiber too soon can backfire and cause cramping & bloating. It is okay to try and slow down diarrhea with a few doses of antidiarrheal medicines.   Bismuth subsalicylate (ex. Kayopectate, Pepto Bismol) for a few doses can help control diarrhea.  Avoid if pregnant.   Loperamide (Imodium) can slow down diarrhea.  Start with one tablet (2mg ) first.  Avoid if you are having fevers or severe pain.  ILEOSTOMY PATIENTS WILL HAVE CHRONIC DIARRHEA since their colon is not in use.    Drink plenty of liquids.  You will need to drink even more glasses of water/liquid a day to avoid getting dehydrated. Record output from your ileostomy.  Expect to empty the bag every 3-4 hours at first.  Most people with a permanent ileostomy empty their bag 4-6 times at the least.   Use antidiarrheal medicine (especially Imodium) several times a day to avoid getting dehydrated.  Start with a dose at bedtime & breakfast.  Adjust up or down as needed.  Increase antidiarrheal medications as directed to avoid emptying the bag more than 8 times a day (every 3 hours). Work with your wound ostomy nurse to learn care for your ostomy.  See ostomy care instructions. TROUBLESHOOTING IRREGULAR BOWELS 1) Start with a soft & bland diet. No spicy, greasy, or fried foods.  2) Avoid gluten/wheat or dairy products from diet to see if symptoms improve. 3) Miralax  17gm or flax seed mixed in [place]. water or juice-daily. May use 2-4 times a day as needed. 4) Gas-X, Phazyme, etc. as needed for gas & bloating.  5) Prilosec (omeprazole) over-the-counter as needed 6)  Consider probiotics (Align, Activa, etc) to help calm the bowels down  Call your doctor if you are getting worse or not getting better.  Sometimes further testing (cultures, endoscopy, X-ray studies, CT scans, bloodwork, etc.) may be needed to help diagnose and treat the cause of the diarrhea. [facility], [address], [place], [place]  [address] [phone] - Main.    [phone]  - Toll Free.   [phone] - Fax [email or web address]  GETTING TO GOOD BOWEL HEALTH.  ######################################################################  EAT Gradually transition to a high fiber diet with a fiber supplement over the next few weeks after discharge.  Start with a pureed / full liquid diet (see below)  WALK Walk an hour a day.  Control your pain to do that.    HAVE A BOWEL MOVEMENT DAILY Keep your bowels regular to avoid problems.  OK to try a laxative to override constipation.  OK to use an antidairrheal to slow down diarrhea.  Call if not better after 2 tries  CALL IF YOU HAVE PROBLEMS/CONCERNS Call if you are still struggling despite following these instructions. Call if you have concerns not answered by these instructions  ######################################################################   Irregular bowel habits such as constipation and diarrhea can lead to many problems over time.  Having one soft bowel movement a day is the most important way to prevent further problems.  The anorectal canal is designed  to handle stretching and feces to safely manage our ability to get rid of solid waste (feces, poop, stool) out of our body.  BUT, hard constipated stools can act like ripping concrete bricks and diarrhea can be a burning fire to  this very sensitive area of our body, causing inflamed hemorrhoids, anal fissures, increasing risk is perirectal abscesses, abdominal pain/bloating, an making irritable bowel worse.      The goal: ONE SOFT BOWEL MOVEMENT A DAY!  To have soft, regular bowel movements:   Drink plenty of fluids, consider 4-6 tall glasses of water a day.    Take plenty of fiber.  Fiber is the undigested part of plant food that passes into the colon, acting s natures broom to encourage bowel motility and movement.  Fiber can absorb and hold large amounts of water. This results in a larger, bulkier stool, which is soft and easier to pass. Work gradually over several weeks up to 6 servings a day of fiber (25g a day even more if needed) in the form of: o Vegetables -- Root (potatoes, carrots, turnips), leafy green (lettuce, salad greens, celery, spinach), or cooked high residue (cabbage, broccoli, etc) o Fruit -- Fresh (unpeeled skin & pulp), Dried (prunes, apricots, cherries, etc ),  or stewed ( applesauce)  o Whole grain breads, pasta, etc (whole wheat)  o Bran cereals   Bulking Agents -- This type of water-retaining fiber generally is easily obtained each day by one of the following:  o Psyllium bran -- The psyllium plant is remarkable because its ground seeds can retain so much water. This product is available as Metamucil, Konsyl, Effersyllium, Per Diem Fiber, or the less expensive generic preparation in drug and health food stores. Although labeled a laxative, it really is not a laxative.  o Methylcellulose -- This is another fiber derived from wood which also retains water. It is available as Citrucel. o Polyethylene Glycol - and artificial fiber commonly called Miralax or Glycolax.  It is helpful for people with gassy or bloated feelings with regular fiber o Flax Seed - a less gassy fiber than psyllium  No reading or other relaxing activity while on the toilet. If bowel movements take longer than 5 minutes, you  are too constipated  AVOID CONSTIPATION.  High fiber and water intake usually takes care of this.  Sometimes a laxative is needed to stimulate more frequent bowel movements, but   Laxatives are not a good long-term solution as it can wear the colon out.  They can help jump-start bowels if constipated, but should be relied on constantly without discussing with your doctor o Osmotics (Milk of Magnesia, Fleets phosphosoda, Magnesium citrate, MiraLax, GoLytely) are safer than  o Stimulants (Senokot, Castor Oil, Dulcolax, Ex Lax)    o Avoid taking laxatives for more than 7 days in a row.   IF SEVERELY CONSTIPATED, try a Bowel Retraining Program: o Do not use laxatives.  o Eat a diet high in roughage, such as bran cereals and leafy vegetables.  o Drink six (6) ounces of prune or apricot juice each morning.  o Eat two (2) large servings of stewed fruit each day.  o Take one (1) heaping tablespoon of a psyllium-based bulking agent twice a day. Use sugar-free sweetener when possible to avoid excessive calories.  o Eat a normal breakfast.  o Set aside 15 minutes after breakfast to sit on the toilet, but do not strain to have a bowel movement.  o If you do not have  a bowel movement by the third day, use an enema and repeat the above steps.   Controlling diarrhea o Switch to liquids and simpler foods for a few days to avoid stressing your intestines further. o Avoid dairy products (especially milk & ice cream) for a short time.  The intestines often can lose the ability to digest lactose when stressed. o Avoid foods that cause gassiness or bloating.  Typical foods include beans and other legumes, cabbage, broccoli, and dairy foods.  Every person has some sensitivity to other foods, so listen to our body and avoid those foods that trigger problems for you. o Adding fiber (Citrucel, Metamucil, psyllium, Miralax) gradually can help thicken stools by absorbing excess fluid and retrain the intestines to act  more normally.  Slowly increase the dose over a few weeks.  Too much fiber too soon can backfire and cause cramping & bloating. o Probiotics (such as active yogurt, Align, etc) may help repopulate the intestines and colon with normal bacteria and calm down a sensitive digestive tract.  Most studies show it to be of mild help, though, and such products can be costly. o Medicines: - Bismuth subsalicylate (ex. Kayopectate, Pepto Bismol) every 30 minutes for up to 6 doses can help control diarrhea.  Avoid if pregnant. - Loperamide (Immodium) can slow down diarrhea.  Start with two tablets (4mg  total) first and then try one tablet every 6 hours.  Avoid if you are having fevers or severe pain.  If you are not better or start feeling worse, stop all medicines and call your doctor for advice o Call your doctor if you are getting worse or not better.  Sometimes further testing (cultures, endoscopy, X-ray studies, bloodwork, etc) may be needed to help diagnose and treat the cause of the diarrhea.  TROUBLESHOOTING IRREGULAR BOWELS 1) Avoid extremes of bowel movements (no bad constipation/diarrhea) 2) Miralax 17gm mixed in 8oz. water or juice-daily. May use BID as needed.  3) Gas-x,Phazyme, etc. as needed for gas & bloating.  4) Soft,bland diet. No spicy,greasy,fried foods.  5) Prilosec over-the-counter as needed  6) May hold gluten/wheat products from diet to see if symptoms improve.  7)  May try probiotics (Align, Activa, etc) to help calm the bowels down 7) If symptoms become worse call back immediately.   Exercising to Stay Healthy Exercising regularly is important. It has many health benefits, such as:  Improving your overall fitness, flexibility, and endurance.  Increasing your bone density.  Helping with weight control.  Decreasing your body fat.  Increasing your muscle strength.  Reducing stress and tension.  Improving your overall health.  In order to become healthy and stay healthy,  it is recommended that you do moderate-intensity and vigorous-intensity exercise. You can tell that you are exercising at a moderate intensity if you have a higher heart rate and faster breathing, but you are still able to hold a conversation. You can tell that you are exercising at a vigorous intensity if you are breathing much harder and faster and cannot hold a conversation while exercising. How often should I exercise? Choose an activity that you enjoy and set realistic goals. Your health care provider can help you to make an activity plan that works for you. Exercise regularly as directed by your health care provider. This may include:  Doing resistance training twice each week, such as: ? Push-ups. ? Sit-ups. ? Lifting weights. ? Using resistance bands.  Doing a given intensity of exercise for a given amount of time.  Choose from these options: ? 150 minutes of moderate-intensity exercise every week. ? 75 minutes of vigorous-intensity exercise every week. ? A mix of moderate-intensity and vigorous-intensity exercise every week.  Children, pregnant women, people who are out of shape, people who are overweight, and older adults may need to consult a health care provider for individual recommendations. If you have any sort of medical condition, be sure to consult your health care provider before starting a new exercise program. What are some exercise ideas? Some moderate-intensity exercise ideas include:  Walking at a rate of 1 mile in 15 minutes.  Biking.  Hiking.  Golfing.  Dancing.  Some vigorous-intensity exercise ideas include:  Walking at a rate of at least 4.5 miles per hour.  Jogging or running at a rate of 5 miles per hour.  Biking at a rate of at least 10 miles per hour.  Lap swimming.  Roller-skating or in-line skating.  Cross-country skiing.  Vigorous competitive sports, such as football, basketball, and soccer.  Jumping rope.  Aerobic dancing.  What are  some everyday activities that can help me to get exercise?  Hines work, such as: ? Pushing a Conservation officer, nature. ? Raking and bagging leaves.  Washing and waxing your car.  Pushing a stroller.  Shoveling snow.  Gardening.  Washing windows or floors. How can I be more active in my day-to-day activities?  Use the stairs instead of the elevator.  Take a walk during your lunch break.  If you drive, park your car farther away from work or school.  If you take public transportation, get off one stop early and walk the rest of the way.  Make all of your phone calls while standing up and walking around.  Get up, stretch, and walk around every 30 minutes throughout the day. What guidelines should I follow while exercising?  Do not exercise so much that you hurt yourself, feel dizzy, or get very short of breath.  Consult your health care provider before starting a new exercise program.  Wear comfortable clothes and shoes with good support.  Drink plenty of water while you exercise to prevent dehydration or heat stroke. Body water is lost during exercise and must be replaced.  Work out until you breathe faster and your heart beats faster. This information is not intended to replace advice given to you by your health care provider. Make sure you discuss any questions you have with your health care provider. Document Released: 03/03/2010 Document Revised: 07/07/2015 Document Reviewed: 07/02/2013 Elsevier Interactive Patient Education  [employer].

## 2017-02-11 MED ORDER — OXYCODONE HCL 5 MG PO TABS
5.0000 mg | ORAL_TABLET | Freq: Four times a day (QID) | ORAL | Status: DC | PRN
  Administered 2017-02-11: 5 mg via ORAL
  Filled 2017-02-11: qty 1

## 2017-02-11 MED ORDER — OXYCODONE HCL 5 MG PO TABS: 5.0000 mg | ORAL_TABLET | Freq: Four times a day (QID) | ORAL | 0 refills | Status: DC | PRN

## 2017-02-11 NOTE — Progress Notes (Addendum)
5 Days Post-Op Lap converted to open colostomy reversal Subjective: Doing well.  Pain controlled.  Ambulated once yesterday.  Having BM's  Objective: Vital signs in last 24 hours: Temp:  [98.3 F (36.8 C)-98.7 F (37.1 C)] 98.3 F (36.8 C) (12/31 0530) Pulse Rate:  [78-83] 82 (12/31 0530) Resp:  [17-18] 18 (12/31 0530) BP: (112-139)/(49-71) 121/55 (12/31 0530) SpO2:  [98 %-100 %] 98 % (12/31 0530)   Intake/Output from previous day: 12/30 0701 - 12/31 0700 In: 1474.9 [P.O.:1080; I.V.:394.9] Out: -  Intake/Output this shift: No intake/output data recorded.   General appearance: alert and cooperative GI: normal findings: soft, non-distended  Incision: no significant drainage  Lab Results:  Recent Labs    02/09/17 0521 02/10/17 0458  WBC 13.0* 8.8  HGB 9.2* 8.6*  HCT 27.7* 27.1*  PLT 290 321   BMET Recent Labs    02/09/17 0521 02/10/17 0458  NA 135 135  K 3.6 3.7  CL 103 103  CO2 26 25  GLUCOSE 107* 103*  BUN 14 13  CREATININE 0.93 0.81  CALCIUM 8.0* 7.8*   PT/INR No results for input(s): LABPROT, INR in the last 72 hours. ABG No results for input(s): PHART, HCO3 in the last 72 hours.  Invalid input(s): PCO2, PO2  MEDS, Scheduled . acetaminophen  1,000 mg Oral Q6H  . enoxaparin (LOVENOX) injection  40 mg Subcutaneous Q24H  . levothyroxine  112 mcg Oral QAC breakfast  . lisinopril  20 mg Oral Daily  . saccharomyces boulardii  250 mg Oral BID    Studies/Results: No results found.  Assessment: s/p Procedure(s): LAPAROSCOPIC CONVERTED OPEN COLOSTOMY REVISION WITH SMALL BOWEL RESECTION Patient Active Problem List   Diagnosis Date Noted  . History of colostomy reversal 02/06/2017  . 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 Lower extremities edema 07/06/2016  . Hypothyroidism 07/05/2016  . Hypertension 07/05/2016  . Pre-diabetes 07/05/2016    Expected post op  course.  post op ileus resolving  Plan: Ambulate  Soft diet PO pain meds Change dressings daily Anticipate d/c tom   LOS: 5 days     .Rosario Adie, MD [facility], [place]   02/11/2017 7:23 AM   I have reviewed the [place] and see no other active narcotics in the system.

## 2017-02-12 DIAGNOSIS — I10 Essential (primary) hypertension: Secondary | ICD-10-CM | POA: Diagnosis not present

## 2017-02-12 DIAGNOSIS — R0609 Other forms of dyspnea: Secondary | ICD-10-CM

## 2017-02-12 DIAGNOSIS — M1990 Unspecified osteoarthritis, unspecified site: Secondary | ICD-10-CM | POA: Diagnosis present

## 2017-02-12 DIAGNOSIS — E039 Hypothyroidism, unspecified: Secondary | ICD-10-CM | POA: Diagnosis not present

## 2017-02-12 DIAGNOSIS — R0602 Shortness of breath: Secondary | ICD-10-CM | POA: Diagnosis present

## 2017-02-12 DIAGNOSIS — R0600 Dyspnea, unspecified: Secondary | ICD-10-CM | POA: Diagnosis present

## 2017-02-12 DIAGNOSIS — K219 Gastro-esophageal reflux disease without esophagitis: Secondary | ICD-10-CM | POA: Diagnosis not present

## 2017-02-12 DIAGNOSIS — Z433 Encounter for attention to colostomy: Secondary | ICD-10-CM | POA: Diagnosis not present

## 2017-02-12 DIAGNOSIS — E785 Hyperlipidemia, unspecified: Secondary | ICD-10-CM | POA: Diagnosis not present

## 2017-02-12 DIAGNOSIS — Z6841 Body Mass Index (BMI) 40.0 and over, adult: Secondary | ICD-10-CM | POA: Diagnosis not present

## 2017-02-12 DIAGNOSIS — K660 Peritoneal adhesions (postprocedural) (postinfection): Secondary | ICD-10-CM | POA: Diagnosis not present

## 2017-02-12 DIAGNOSIS — K5792 Diverticulitis of intestine, part unspecified, without perforation or abscess without bleeding: Secondary | ICD-10-CM | POA: Diagnosis not present

## 2017-02-12 DIAGNOSIS — L817 Pigmented purpuric dermatosis: Secondary | ICD-10-CM | POA: Diagnosis present

## 2017-02-12 NOTE — Discharge Summary (Signed)
Physician Discharge Summary  Patient ID: [name] MRN: [id number] DOB/AGE: 1956-01-07  62 y.o.  Admit date: 02/06/2017 Discharge date: 02/12/2017   Patient Care Team: [name], MD as PCP - General (Internal Medicine) Serena Colonel, RN as [facility]  Discharge Diagnoses:  Principal Problem:   Diverticulitis with obstruction s/p sigmoid colectomy May 2018 & colostomy takedown 02/06/2017 Active Problems:   History of colostomy reversal   Diverticular disease   Morbid obesity ([place])   Schamberg disease   Hyperlipidemia   Arthritis   DOE (dyspnea on exertion)   6 Days Post-Op  02/06/2017  POST-OPERATIVE DIAGNOSIS:   diverticulitis disease  SURGERY:  02/06/2017  Procedure(s): LAPAROSCOPIC CONVERTED OPEN COLOSTOMY REVISION WITH SMALL BOWEL RESECTION  SURGEON:    Surgeon(s): Leighton Ruff, MD Ralene Ok, MD  Consults: None  Hospital Course:   The patient underwent the surgery above.  Postoperatively, the patient gradually mobilized and advanced to a solid diet.  Pain and other symptoms were treated aggressively.    By the time of discharge, the patient was walking well the hallways, eating food, having flatus.  Pain was well-controlled on an oral medications.  Based on meeting discharge criteria and continuing to recover, I felt it was safe for the patient to be discharged from the hospital to further recover with close followup. Postoperative recommendations were discussed in detail.  They are written as well.  Discharged Condition: good  Disposition:  Follow-up Information    Leighton Ruff, MD. Schedule an appointment as soon as possible for a visit in 2 week(s).   Specialty:  General Surgery Contact information: [address] [place] [address] [phone]        [facility], [place]. Schedule an appointment as soon as possible for a visit in 1 week(s).   Specialty:  General  Surgery Why:  Follow-up next week to have your staples removed.  Discussed with Mammie Lorenzo, Dr. Marcello Moores' nurse Contact information: [address] [place] [phone]          06-Home-Health Care Svc  Discharge Instructions    Call MD for:   Complete by:  As directed    FEVER > 101.5 F (Temperatures <101.47F can occasionally happen and are not significant)   Call MD for:  extreme fatigue   Complete by:  As directed    Call MD for:  persistant dizziness or light-headedness   Complete by:  As directed    Call MD for:  persistant nausea and vomiting   Complete by:  As directed    Call MD for:  redness, tenderness, or signs of infection (pain, swelling, redness, odor or green/yellow discharge around incision site)   Complete by:  As directed    Call MD for:  severe uncontrolled pain   Complete by:  As directed    Diet - low sodium heart healthy   Complete by:  As directed    Follow a light diet the first few days at home.    If you feel full, bloated, or constipated, stay on a liquid diet until you feel better and not constipated. Gradually get back to a solid diet.  Avoid fast food or heavy meals the first week as you are more likely to get nauseated. It is expected for your digestive tract to need a few months to get back to normal.   Discharge wound care:   Complete by:  As directed  You have an open wound. In general, it is encouraged that when you remove your dressing and packing; then, shower with soap & water, and replace your dressing once a day.    He may leave the skin staples open to air or cover as needed.  Follow-up in our office next week to have the skin staples removed  Pressure on the dressing for 30 minutes will stop most wound bleeding.   Eventually your body will heal & pull the open wound closed over the next few months.  Raw open wounds will occasionally bleed or secrete yellow drainage until it heals  closed.   Drain sites will drain a little until the drain is removed.   Even closed incisions can have mild bleeding or drainage the first few days until the skin edges scab over & seal.   Driving Restrictions   Complete by:  As directed    You may drive when you are no longer taking narcotic prescription pain medication, you can comfortably wear a seatbelt, and you can safely make sudden turns/stops to protect yourself without hesitating due to pain.   Increase activity slowly   Complete by:  As directed    Lifting restrictions   Complete by:  As directed    Start light daily activities --- self-care, walking, climbing stairs- beginning the day after surgery.   Gradually increase activities as tolerated.   Control your pain to be active.   Stop when you are tired.   Ideally, walk several times a day, eventually an hour a day.   Most people are back to most day-to-day activities in a few weeks.  It takes 4-8 weeks to get back to unrestricted, intense activity. If you can walk 30 minutes without difficulty, it is safe to try more intense activity such as jogging, treadmill, bicycling, low-impact aerobics, swimming, etc. Save the most intensive and strenuous activity for last (Usually 4-8 weeks after surgery) such as sit-ups, heavy lifting, contact sports, etc.   Refrain from any intense heavy lifting or straining until you are off narcotics for pain control.  You will have off days, but things should improve week-by-week. DO NOT PUSH THROUGH PAIN.   Let pain be your guide: If it hurts to do something, don't do it.  Pain is your body warning you to avoid that activity for another week until the pain goes down.   May shower / Bathe   Complete by:  As directed    May walk up steps   Complete by:  As directed    Sexual Activity Restrictions   Complete by:  As directed    You may have sexual intercourse when it is comfortable. If it hurts to do something, stop.      Allergies as of 02/12/2017    No Known Allergies     Medication List    TAKE these medications   CALCIUM 600+D 600-400 MG-UNIT tablet Generic drug:  Calcium Carbonate-Vitamin D Take 1 tablet by mouth daily.   levothyroxine 112 MCG tablet Commonly known as:  SYNTHROID, LEVOTHROID Take 112 mcg by mouth daily before breakfast.   lisinopril 20 MG tablet Commonly known as:  PRINIVIL,ZESTRIL Take 20 mg by mouth daily.   multivitamin with minerals tablet Take 1 tablet by mouth daily.   oxyCODONE 5 MG immediate release tablet Commonly known as:  Oxy IR/ROXICODONE Take 1-2 tablets (5-10 mg total) by mouth every 6 (six) hours as needed for moderate pain, severe pain or breakthrough pain.  potassium chloride SA 20 MEQ tablet Commonly known as:  K-DUR,KLOR-CON Take 20 mEq by mouth daily as needed (when taking Torsemide).   simvastatin 20 MG tablet Commonly known as:  ZOCOR Take 20 mg by mouth at bedtime.   torsemide 20 MG tablet Commonly known as:  DEMADEX Take 20 mg by mouth daily as needed (for edema).   Vitamin D 2000 units tablet Take 4,000 Units by mouth daily.            Discharge Care Instructions  (From admission, onward)        Start     Ordered   02/12/17 0000  Discharge wound care:    Comments:  You have an open wound. In general, it is encouraged that when you remove your dressing and packing; then, shower with soap & water, and replace your dressing once a day.    He may leave the skin staples open to air or cover as needed.  Follow-up in our office next week to have the skin staples removed  Pressure on the dressing for 30 minutes will stop most wound bleeding.   Eventually your body will heal & pull the open wound closed over the next few months.  Raw open wounds will occasionally bleed or secrete yellow drainage until it heals closed.   Drain sites will drain a little until the drain is removed.   Even closed incisions can have mild bleeding or drainage the first few days until  the skin edges scab over & seal.   02/12/17 0844      Significant Diagnostic Studies:  Results for orders placed or performed during the hospital encounter of 02/06/17 (from the past 72 hour(s))  CBC     Status: Abnormal   Collection Time: 02/10/17  4:58 AM  Result Value Ref Range   WBC 8.8 4.0 - 10.5 K/uL   RBC 3.18 (L) 3.87 - 5.11 MIL/uL   Hemoglobin 8.6 (L) 12.0 - 15.0 g/dL   HCT 27.1 (L) 36.0 - 46.0 %   MCV 85.2 78.0 - 100.0 fL   MCH 27.0 26.0 - 34.0 pg   MCHC 31.7 30.0 - 36.0 g/dL   RDW 14.7 11.5 - 15.5 %   Platelets 321 150 - 400 K/uL  Basic metabolic panel     Status: Abnormal   Collection Time: 02/10/17  4:58 AM  Result Value Ref Range   Sodium 135 135 - 145 mmol/L   Potassium 3.7 3.5 - 5.1 mmol/L   Chloride 103 101 - 111 mmol/L   CO2 25 22 - 32 mmol/L   Glucose, Bld 103 (H) 65 - 99 mg/dL   BUN 13 6 - 20 mg/dL   Creatinine, Ser 0.81 0.44 - 1.00 mg/dL   Calcium 7.8 (L) 8.9 - 10.3 mg/dL   GFR calc non Af Amer >60 >60 mL/min   GFR calc Af Amer >60 >60 mL/min    Comment: (NOTE) The eGFR has been calculated using the CKD EPI equation. This calculation has not been validated in all clinical situations. eGFR's persistently <60 mL/min signify possible Chronic Kidney Disease.    Anion gap 7 5 - 15    No results found.  Discharge Exam: Blood pressure (!) 150/76, pulse 80, temperature 98 F (36.7 C), temperature source Oral, resp. rate 14, height _0  (1.626 m), weight 103.9 kg (229 lb), SpO2 98 %.  General: Pt awake/alert/oriented x4 in No acute distress Eyes: PERRL, normal EOM.  Sclera clear.  No icterus Neuro: CN  II-XII intact w/o focal sensory/motor deficits. Lymph: No head/neck/groin lymphadenopathy Psych:  No delerium/psychosis/paranoia HENT: Normocephalic, Mucus membranes moist.  No thrush Neck: Supple, No tracheal deviation Chest: No chest wall pain w good excursion CV:  Pulses intact.  Regular rhythm MS: Normal AROM mjr joints.  No obvious  deformity Abdomen: Morbidly obese but Soft.  Nondistended.  Mildly tender at incisions only.  Mild redness at staples only.  No cellulitis.  Colostomy wound closing.  No evidence of peritonitis.  No incarcerated hernias. Ext:  SCDs BLE.  No mjr edema.  No cyanosis Skin: No petechiae / purpura  Past Medical History:  Diagnosis Date  . Arthritis   . DOE (dyspnea on exertion)    mild  . Hyperlipidemia   . Hypertension   . Hypothyroidism   . Morbid obesity ([place])   . Pre-diabetes   . Schamberg disease     Past Surgical History:  Procedure Laterality Date  . BOWEL RESECTION  07/10/2016   Procedure: SMALL BOWEL RESECTION repair of small bowel x 2;  Surgeon: Fanny Skates, MD;  Location: [facility];  Service: General;;  . COLON RESECTION SIGMOID  07/10/2016   Procedure: sigmoid colon resection with colostomy ;  Surgeon: Fanny Skates, MD;  Location: [facility];  Service: General;;  . COLOSTOMY TAKEDOWN N/A 02/06/2017   Procedure: LAPAROSCOPIC CONVERTED OPEN COLOSTOMY REVISION WITH SMALL BOWEL RESECTION;  Surgeon: Leighton Ruff, MD;  Location: [facility];  Service: General;  Laterality: N/A;  . FLEXIBLE SIGMOIDOSCOPY N/A 07/06/2016   Procedure: Beryle Quant;  Surgeon: Carol Ada, MD;  Location: [facility];  Service: Endoscopy;  Laterality: N/A;  . LAPAROSCOPY N/A 07/10/2016   Procedure: LAPAROSCOPY converted to open;  Surgeon: Fanny Skates, MD;  Location: [facility];  Service: General;  Laterality: N/A;  . TONSILLECTOMY     age 75 yrs    Social History   Socioeconomic History  . Marital status: Single    Spouse name: Not on file  . Number of children: Not on file  . Years of education: Not on file  . Highest education level: Not on file  Social Needs  . Financial resource strain: Not on file  . Food insecurity - worry: Not on file  . Food insecurity - inability: Not on file  . Transportation needs - medical: Not on file  . Transportation needs - non-medical: Not on file   Occupational History  . Not on file  Tobacco Use  . Smoking status: Never Smoker  . Smokeless tobacco: Never Used  Substance and Sexual Activity  . Alcohol use: No  . Drug use: No  . Sexual activity: Not on file  Other Topics Concern  . Not on file  Social History Narrative  . Not on file    Family History  Problem Relation Age of Onset  . Hypertension Mother   . Heart Problems Mother        Pacemaker  . Heart attack Father        at age 26.  Marland Kitchen Heart attack Maternal Grandfather 38       Died of AAA  . AAA (abdominal aortic aneurysm) Maternal Grandfather   . Heart attack Maternal Uncle   . Stroke Paternal Aunt   . Alzheimer's disease Maternal Grandmother     Current Facility-Administered Medications  Medication Dose Route Frequency Provider Last Rate Last Dose  . acetaminophen (TYLENOL) tablet 1,000 mg  1,000 mg Oral M0Q Leighton Ruff, MD   6,761 mg at 02/12/17 0550  .  diphenhydrAMINE (BENADRYL) 12.5 MG/5ML elixir 12.5 mg  12.5 mg Oral J5H PRN Leighton Ruff, MD       Or  . diphenhydrAMINE (BENADRYL) injection 12.5 mg  12.5 mg Intravenous I3U PRN Leighton Ruff, MD      . enoxaparin (LOVENOX) injection 40 mg  40 mg Subcutaneous P73H Leighton Ruff, MD   40 mg at 02/12/17 0745  . HYDROmorphone (DILAUDID) injection 0.5 mg  0.5 mg Intravenous D8X PRN Leighton Ruff, MD   0.5 mg at 02/10/17 7847  . levothyroxine (SYNTHROID, LEVOTHROID) tablet 112 mcg  112 mcg Oral QAC breakfast Leighton Ruff, MD   841 mcg at 02/12/17 0745  . lisinopril (PRINIVIL,ZESTRIL) tablet 20 mg  20 mg Oral Daily Leighton Ruff, MD   20 mg at 02/11/17 0933  . ondansetron (ZOFRAN) tablet 4 mg  4 mg Oral Q8S PRN Leighton Ruff, MD       Or  . ondansetron [facility]) injection 4 mg  4 mg Intravenous K8H PRN Leighton Ruff, MD   4 mg at 38/87/19 0905  . oxyCODONE (Oxy IR/ROXICODONE) immediate release tablet 5-10 mg  5-10 mg Oral L9D PRN Leighton Ruff, MD   5 mg at 47/18/55 1525  . saccharomyces boulardii  (FLORASTOR) capsule 250 mg  250 mg Oral BID Leighton Ruff, MD   015 mg at 02/11/17 2300     No Known Allergies  Signed: Morton Peters, M.D., F.A.C.S. Gastrointestinal and Minimally Invasive Surgery [facility] [address], [place], [place] [address] [phone] Main / Paging   02/12/2017, 8:45 AM

## 2017-02-13 ENCOUNTER — Encounter: Payer: Self-pay | Admitting: *Deleted

## 2017-02-13 ENCOUNTER — Other Ambulatory Visit: Payer: Self-pay | Admitting: *Deleted

## 2017-02-13 NOTE — Patient Outreach (Addendum)
[facility]) Care Management  02/13/2017  [name] [birth year] [id number]   Subjective: Telephone call to patient's home number, spoke with patient, and HIPAA verified.  Discussed [facility] Care Management [facility] Transition of care follow up, patient voiced understanding, and is in agreement to follow up.  Patient states she is doing well, having some soreness for the surgery, remembers speaking with this RNCM in the past, a follow up scheduled with surgeon on 02/18/17, and on 02/26/17.  Patient states she is able to manage self care and has assistance as needed. Patient voices understanding of medical diagnosis, surgery, and treatment plan.   States she is accessing the following [facility] benefits: outpatient pharmacy, hospital indemnity, and has family medical leave act (FMLA) in place.  Patient states she does not have any education material, transition of care, care coordination, disease management, disease monitoring, transportation, community resource, or pharmacy needs at this time.  States she is very appreciative of the follow up and is in agreement to receive [facility] Management information.       Objective: Per KPN (Knowledge Performance Now, point of care tool) and chart review, patient hospitalized 02/06/17 -02/12/17 for diverticulitis disease. .     Status post LAPAROSCOPIC CONVERTED OPEN COLOSTOMY REVERSAL WITH SMALL BOWEL RESECTION pm 02/06/17.  Patient hospitalized 07/06/26 - 07/17/16 for Diverticulitis with obstruction.  Status post Laparoscopy, laparotomy,  Takedown splenic flexure, Sigmoid colectomy with colostomy formation [name], Small bowel resection X 1, and Enterorrhaphy X 2 on 07/10/16.  Patient also has a history of prediabetes, hypertension,  and hypothyroidism.       Assessment: Received [facility] Transition of care referral on 02/01/17.  Transition of care follow up completed, no care management needs, and will proceed with case closure.         Plan: RNCM will send patient successful outreach letter, [facility] pamphlet, and magnet. RNCM will send case closure due to follow up completed / no care management needs request to [name].       Jahsir Rama H. Annia Friendly, BSN, [place] [facility] Telephonic CM Phone: [phone] Fax: [phone]

## 2017-02-26 MED FILL — LEVOTHYROXINE 112 MCG TAB: 112 | 30 days supply | Qty: 30 | Fill #5

## 2017-02-26 MED FILL — SIMVASTATIN 20 MG TABLET: 20 | 90 days supply | Qty: 90 | Fill #1

## 2017-03-18 MED FILL — LISINOPRIL 20 MG TABLET: 20 | 90 days supply | Qty: 90 | Fill #0

## 2017-04-10 MED FILL — LEVOTHYROXINE 112 MCG TAB: 112 | 30 days supply | Qty: 30 | Fill #0

## 2017-04-11 DIAGNOSIS — E785 Hyperlipidemia, unspecified: Secondary | ICD-10-CM | POA: Diagnosis not present

## 2017-04-11 DIAGNOSIS — I10 Essential (primary) hypertension: Secondary | ICD-10-CM | POA: Diagnosis not present

## 2017-04-11 DIAGNOSIS — Z79899 Other long term (current) drug therapy: Secondary | ICD-10-CM | POA: Diagnosis not present

## 2017-04-11 DIAGNOSIS — E039 Hypothyroidism, unspecified: Secondary | ICD-10-CM | POA: Diagnosis not present

## 2017-05-13 MED FILL — LEVOTHYROXINE 112 MCG TAB: 112 | 30 days supply | Qty: 30 | Fill #1

## 2017-06-03 MED FILL — SIMVASTATIN 20 MG TABLET: 20 | 90 days supply | Qty: 90 | Fill #2

## 2017-06-10 MED FILL — LISINOPRIL 20 MG TABLET: 20 | 90 days supply | Qty: 90 | Fill #0

## 2017-06-10 MED FILL — LEVOTHYROXINE 112 MCG TAB: 112 | 30 days supply | Qty: 30 | Fill #2

## 2017-07-12 MED FILL — LEVOTHYROXINE 112 MCG TAB: 112 | 30 days supply | Qty: 30 | Fill #3

## 2017-08-16 MED FILL — LEVOTHYROXINE 112 MCG TAB: 112 | 30 days supply | Qty: 30 | Fill #0

## 2017-08-30 MED FILL — SIMVASTATIN 20 MG TABLET: 20 | 90 days supply | Qty: 90 | Fill #0

## 2017-08-31 DIAGNOSIS — I10 Essential (primary) hypertension: Secondary | ICD-10-CM | POA: Diagnosis not present

## 2017-08-31 DIAGNOSIS — E039 Hypothyroidism, unspecified: Secondary | ICD-10-CM | POA: Diagnosis not present

## 2017-08-31 DIAGNOSIS — M545 Low back pain: Secondary | ICD-10-CM | POA: Diagnosis not present

## 2017-08-31 DIAGNOSIS — M533 Sacrococcygeal disorders, not elsewhere classified: Secondary | ICD-10-CM | POA: Diagnosis not present

## 2017-08-31 DIAGNOSIS — E7800 Pure hypercholesterolemia, unspecified: Secondary | ICD-10-CM | POA: Diagnosis not present

## 2017-08-31 DIAGNOSIS — Z79899 Other long term (current) drug therapy: Secondary | ICD-10-CM | POA: Diagnosis not present

## 2017-09-11 DIAGNOSIS — M5442 Lumbago with sciatica, left side: Secondary | ICD-10-CM | POA: Diagnosis not present

## 2017-09-11 DIAGNOSIS — Z09 Encounter for follow-up examination after completed treatment for conditions other than malignant neoplasm: Secondary | ICD-10-CM | POA: Diagnosis not present

## 2017-09-11 DIAGNOSIS — Z1389 Encounter for screening for other disorder: Secondary | ICD-10-CM | POA: Diagnosis not present

## 2017-09-11 MED FILL — predniSONE 10 MG TABS: 10 | 6 days supply | Qty: 21 | Fill #0

## 2017-09-11 MED FILL — MELOXICAM 7.5 MG TABLET: 7.5 | 30 days supply | Qty: 30 | Fill #0

## 2017-09-18 MED FILL — LEVOTHYROXINE 112 MCG TAB: 112 | 30 days supply | Qty: 30 | Fill #0

## 2017-09-18 MED FILL — LISINOPRIL 20 MG TABLET: 20 | 90 days supply | Qty: 90 | Fill #0

## 2017-09-23 DIAGNOSIS — Z0000 Encounter for general adult medical examination without abnormal findings: Secondary | ICD-10-CM | POA: Diagnosis not present

## 2017-09-23 DIAGNOSIS — I129 Hypertensive chronic kidney disease with stage 1 through stage 4 chronic kidney disease, or unspecified chronic kidney disease: Secondary | ICD-10-CM | POA: Diagnosis not present

## 2017-09-23 DIAGNOSIS — N182 Chronic kidney disease, stage 2 (mild): Secondary | ICD-10-CM | POA: Diagnosis not present

## 2017-09-23 DIAGNOSIS — L03116 Cellulitis of left lower limb: Secondary | ICD-10-CM | POA: Diagnosis not present

## 2017-09-23 DIAGNOSIS — Z0001 Encounter for general adult medical examination with abnormal findings: Secondary | ICD-10-CM | POA: Diagnosis not present

## 2017-09-23 MED FILL — CEPHALEXIN 500 MG CAPSULE: 500 | 7 days supply | Qty: 21 | Fill #0

## 2017-10-04 MED FILL — traMADol HCL 50 MG TABS: 50 | 30 days supply | Qty: 30 | Fill #0

## 2017-10-25 DIAGNOSIS — Z1231 Encounter for screening mammogram for malignant neoplasm of breast: Secondary | ICD-10-CM | POA: Diagnosis not present

## 2017-10-25 MED FILL — LEVOTHYROXINE 112 MCG TAB: 112 | 90 days supply | Qty: 90 | Fill #0

## 2017-11-15 ENCOUNTER — Other Ambulatory Visit: Payer: Self-pay

## 2017-11-15 ENCOUNTER — Ambulatory Visit: Payer: 59 | Admitting: Internal Medicine

## 2017-11-15 ENCOUNTER — Encounter: Payer: Self-pay | Admitting: Internal Medicine

## 2017-11-15 VITALS — BP 116/70 | HR 96 | Temp 97.7°F | Ht 64.0 in | Wt 231.8 lb

## 2017-11-15 DIAGNOSIS — R1011 Right upper quadrant pain: Secondary | ICD-10-CM

## 2017-11-15 DIAGNOSIS — I739 Peripheral vascular disease, unspecified: Secondary | ICD-10-CM

## 2017-11-15 DIAGNOSIS — R109 Unspecified abdominal pain: Secondary | ICD-10-CM

## 2017-11-15 DIAGNOSIS — S81802D Unspecified open wound, left lower leg, subsequent encounter: Secondary | ICD-10-CM | POA: Diagnosis not present

## 2017-11-15 NOTE — Progress Notes (Signed)
Subjective:     Patient ID: [name] , female    DOB: [birth year] , 62 y.o.   MRN: [id number]   HPI  July she fell and scraped her L shin and the wound from there has not completely healed and stays raw looking and has been draining off and on. Has completed her antibiotics which she was placed 2 weeks ago, and feels her leg is getting better. She sleeps on her bed, but her job is sitting all day.  2- Cramping sensation on RUQ x 6 months. Hits her 1-2 times a week, lasting a few seconds,  sometimes goes without having it for  2-3 weeks. She denies N/V/C/D, sweating, syncope, bowel changes.    Past Medical History:  Diagnosis Date  . Arthritis   . DOE (dyspnea on exertion)    mild  . Hyperlipidemia   . Hypertension   . Hypothyroidism   . Morbid obesity ([place])   . Pre-diabetes   . Schamberg disease       Current Outpatient Medications:  .  Calcium Carbonate-Vitamin D (CALCIUM 600+D) 600-400 MG-UNIT tablet, Take 1 tablet by mouth daily., Disp: , Rfl:  .  Cholecalciferol (VITAMIN D) 2000 units tablet, Take 4,000 Units by mouth daily., Disp: , Rfl:  .  levothyroxine (SYNTHROID, LEVOTHROID) 112 MCG tablet, Take 112 mcg by mouth daily before breakfast., Disp: , Rfl:  .  lisinopril (PRINIVIL,ZESTRIL) 20 MG tablet, Take 20 mg by mouth daily., Disp: , Rfl:  .  Multiple Vitamins-Minerals (MULTIVITAMIN WITH MINERALS) tablet, Take 1 tablet by mouth daily., Disp: , Rfl:  .  potassium chloride SA (K-DUR,KLOR-CON) 20 MEQ tablet, Take 20 mEq by mouth daily as needed (when taking Torsemide). , Disp: , Rfl:  .  simvastatin (ZOCOR) 20 MG tablet, Take 20 mg by mouth at bedtime. , Disp: , Rfl:  .  torsemide (DEMADEX) 20 MG tablet, Take 20 mg by mouth daily as needed (for edema). , Disp: , Rfl:    Review of Systems  Constitutional: Negative for appetite change, chills and fever.  HENT: Negative.   Respiratory: Negative for cough and chest tightness.   Cardiovascular: Positive for leg swelling.  Negative for chest pain and palpitations.  Gastrointestinal: Positive for abdominal pain. Negative for abdominal distention, blood in stool, constipation, diarrhea, nausea and vomiting.  Musculoskeletal: Negative for gait problem.  Skin: Positive for wound. Negative for rash.  Neurological: Negative for dizziness and numbness.     Today's Vitals   11/15/17 1209  BP: 116/70  Pulse: 96  Temp: 97.7 F (36.5 C)  TempSrc: Oral  SpO2: 96%  Weight: 231 lb 12.8 oz (105.1 kg)  Height: 5\' 4"  (1.626 m)   Body mass index is 39.79 kg/m.   Objective:  Physical Exam  Constitutional: She is oriented to person, place, and time. She appears well-developed and well-nourished. No distress.  HENT:  Head: Normocephalic.  Right Ear: External ear normal.  Left Ear: External ear normal.  Nose: Nose normal.  Eyes: Conjunctivae are normal. No scleral icterus.  Neck: Neck supple.  Pulmonary/Chest: Effort normal.  Abdominal: Soft. Bowel sounds are normal. She exhibits no distension and no mass. There is no tenderness. There is no rebound and no guarding. No hernia.  Musculoskeletal: Normal range of motion. She exhibits edema. She exhibits no deformity.  Neurological: She is alert and oriented to person, place, and time.  Skin: Skin is warm and dry. No rash noted. She is not diaphoretic. No pallor.  Has Erythema  with minimal warmth of L lower leg circumpherentially and few ulcers, most less than 1.5 cm in size.  She has mild clear serous matter weeping from skin.   Psychiatric: She has a normal mood and affect. Her behavior is normal. Judgment and thought content normal.    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 I sent her to wound clinic.  I placed clean gauze and coband.  2- Chronic  Intermittent abdominal cramps- may be due to adhesions. She will palpate her abdomen when it hits her to see if this helps or makes it worse. Then discuss this during her next visit.      Tyannah Sane  RODRIGUEZ-SOUTHWORTH, PA-C

## 2017-11-15 NOTE — Patient Instructions (Signed)
Come back in 1 month if the abdominal cramping continues.  Venous Ulcer A venous ulcer is a shallow sore on your lower leg. It is caused by poor circulation in your veins. Venous ulcer is the most common type of lower leg ulcer. You may have venous ulcers on one leg or on both legs. This condition most often develops around your ankles. This type of ulcer may last for a long time (chronic ulcer) or it may return often (recurrent ulcer). Follow these instructions at home: Wound care  Follow instructions from your doctor about: ? How to take care of your wound. ? When and how you should change your bandage (dressing). ? When you should remove your bandage. If your bandage is dry and gets stuck to your leg when you try to remove it, moisten or wet the bandage with saline solution or water. This helps you to remove it without harming your skin or wound.  Check your wound every day for signs of infection. Have a caregiver do this for you if you are not able to do it yourself. Watch for: ? More redness, swelling, or pain. ? More fluid or blood. ? Pus, warmth, or a bad smell. Medicines  Take over-the-counter and prescription medicines only as told by your doctor.  If you were prescribed an antibiotic medicine, take it or apply it as told by your doctor. Do not stop taking or using the antibiotic even if your condition improves. Activity  Do not stand or sit in one position for a long period of time. Rest with your legs raised during the day. If possible, keep your legs above your heart for 30 minutes, 3-4 times a day, or as told by your doctor.  Do not sit with your legs crossed.  Walk often to increase the blood flow in your legs. Ask your doctor what level of activity is safe for you.  If you are taking a long ride in a car or plane, take a break to walk around at least once every two hours, or as told by your doctor. Ask your doctor if you should take aspirin before long trips. General  instructions   Wear elastic stockings, compression stockings, or support hose as told by your doctor. This is very important.  Raise the foot of your bed as told by your doctor.  Do not smoke.  Keep all follow-up visits as told by your doctor. This is important. Contact a doctor if:  You have a fever.  Your ulcer is getting larger or is not healing.  Your pain gets worse.  You have more redness or swelling around your ulcer.  You have more fluid, blood, or pus coming from your ulcer after it has been cleaned by you or your doctor.  You have warmth or a bad smell coming from your ulcer. This information is not intended to replace advice given to you by your health care provider. Make sure you discuss any questions you have with your health care provider. Document Released: 03/08/2004 Document Revised: 07/07/2015 Document Reviewed: 06/09/2014 Elsevier Interactive Patient Education  [employer].

## 2017-11-19 ENCOUNTER — Encounter: Payer: Self-pay | Admitting: Internal Medicine

## 2017-12-10 ENCOUNTER — Encounter (HOSPITAL_BASED_OUTPATIENT_CLINIC_OR_DEPARTMENT_OTHER): Payer: 59 | Attending: Internal Medicine

## 2017-12-10 DIAGNOSIS — I890 Lymphedema, not elsewhere classified: Secondary | ICD-10-CM | POA: Diagnosis not present

## 2017-12-10 DIAGNOSIS — S81802A Unspecified open wound, left lower leg, initial encounter: Secondary | ICD-10-CM | POA: Diagnosis not present

## 2017-12-10 DIAGNOSIS — I10 Essential (primary) hypertension: Secondary | ICD-10-CM | POA: Insufficient documentation

## 2017-12-10 DIAGNOSIS — L97221 Non-pressure chronic ulcer of left calf limited to breakdown of skin: Secondary | ICD-10-CM | POA: Diagnosis not present

## 2017-12-10 DIAGNOSIS — I872 Venous insufficiency (chronic) (peripheral): Secondary | ICD-10-CM | POA: Diagnosis not present

## 2017-12-10 DIAGNOSIS — I87332 Chronic venous hypertension (idiopathic) with ulcer and inflammation of left lower extremity: Secondary | ICD-10-CM | POA: Insufficient documentation

## 2017-12-12 ENCOUNTER — Other Ambulatory Visit: Payer: Self-pay | Admitting: Internal Medicine

## 2017-12-12 MED FILL — SIMVASTATIN 20 MG TABLET: 20 | 90 days supply | Qty: 90 | Fill #0

## 2017-12-16 ENCOUNTER — Telehealth (HOSPITAL_COMMUNITY): Payer: Self-pay | Admitting: Surgery

## 2017-12-16 NOTE — Telephone Encounter (Signed)
Received an order from Dr. Dellia Nims - LE venous reflux (bilateral)-a voicemail was left at [phone] (pts cell) asking her to call Rip Harbour at [facility] to arrange this request.

## 2017-12-17 ENCOUNTER — Telehealth (HOSPITAL_COMMUNITY): Payer: Self-pay | Admitting: Surgery

## 2017-12-17 ENCOUNTER — Encounter (HOSPITAL_BASED_OUTPATIENT_CLINIC_OR_DEPARTMENT_OTHER): Payer: 59 | Attending: Internal Medicine

## 2017-12-17 DIAGNOSIS — I10 Essential (primary) hypertension: Secondary | ICD-10-CM | POA: Insufficient documentation

## 2017-12-17 DIAGNOSIS — Z6841 Body Mass Index (BMI) 40.0 and over, adult: Secondary | ICD-10-CM | POA: Diagnosis not present

## 2017-12-17 DIAGNOSIS — L97222 Non-pressure chronic ulcer of left calf with fat layer exposed: Secondary | ICD-10-CM | POA: Insufficient documentation

## 2017-12-17 DIAGNOSIS — I890 Lymphedema, not elsewhere classified: Secondary | ICD-10-CM | POA: Insufficient documentation

## 2017-12-17 DIAGNOSIS — S81802A Unspecified open wound, left lower leg, initial encounter: Secondary | ICD-10-CM | POA: Diagnosis not present

## 2017-12-17 DIAGNOSIS — I87332 Chronic venous hypertension (idiopathic) with ulcer and inflammation of left lower extremity: Secondary | ICD-10-CM | POA: Insufficient documentation

## 2017-12-17 NOTE — Telephone Encounter (Signed)
Vm left at [phone] asking pt to call Rip Harbour at [facility] to arrange the LE Venous reflux exam ordered by Dr. Dellia Nims.

## 2017-12-20 DIAGNOSIS — Z6841 Body Mass Index (BMI) 40.0 and over, adult: Secondary | ICD-10-CM | POA: Diagnosis not present

## 2017-12-20 DIAGNOSIS — L97222 Non-pressure chronic ulcer of left calf with fat layer exposed: Secondary | ICD-10-CM | POA: Diagnosis not present

## 2017-12-20 DIAGNOSIS — I890 Lymphedema, not elsewhere classified: Secondary | ICD-10-CM | POA: Diagnosis not present

## 2017-12-20 DIAGNOSIS — I87332 Chronic venous hypertension (idiopathic) with ulcer and inflammation of left lower extremity: Secondary | ICD-10-CM | POA: Diagnosis not present

## 2017-12-20 DIAGNOSIS — I10 Essential (primary) hypertension: Secondary | ICD-10-CM | POA: Diagnosis not present

## 2017-12-24 ENCOUNTER — Other Ambulatory Visit (HOSPITAL_BASED_OUTPATIENT_CLINIC_OR_DEPARTMENT_OTHER): Payer: Self-pay | Admitting: Internal Medicine

## 2017-12-24 DIAGNOSIS — R600 Localized edema: Secondary | ICD-10-CM

## 2017-12-25 ENCOUNTER — Ambulatory Visit (HOSPITAL_COMMUNITY)
Admission: RE | Admit: 2017-12-25 | Discharge: 2017-12-25 | Disposition: A | Discharge status: Home / Self Care | Payer: 59 | Source: Ambulatory Visit | Attending: Family | Admitting: Family

## 2017-12-25 DIAGNOSIS — Z6841 Body Mass Index (BMI) 40.0 and over, adult: Secondary | ICD-10-CM | POA: Diagnosis not present

## 2017-12-25 DIAGNOSIS — L97222 Non-pressure chronic ulcer of left calf with fat layer exposed: Secondary | ICD-10-CM | POA: Diagnosis not present

## 2017-12-25 DIAGNOSIS — R600 Localized edema: Secondary | ICD-10-CM | POA: Insufficient documentation

## 2017-12-25 DIAGNOSIS — I890 Lymphedema, not elsewhere classified: Secondary | ICD-10-CM | POA: Diagnosis not present

## 2017-12-25 DIAGNOSIS — I87332 Chronic venous hypertension (idiopathic) with ulcer and inflammation of left lower extremity: Secondary | ICD-10-CM | POA: Diagnosis not present

## 2017-12-25 DIAGNOSIS — I10 Essential (primary) hypertension: Secondary | ICD-10-CM | POA: Diagnosis not present

## 2017-12-25 DIAGNOSIS — S81802A Unspecified open wound, left lower leg, initial encounter: Secondary | ICD-10-CM | POA: Diagnosis not present

## 2017-12-30 MED FILL — LISINOPRIL 20 MG TABLET: 20 | 90 days supply | Qty: 90 | Fill #0

## 2018-01-01 DIAGNOSIS — L97222 Non-pressure chronic ulcer of left calf with fat layer exposed: Secondary | ICD-10-CM | POA: Diagnosis not present

## 2018-01-01 DIAGNOSIS — Z6841 Body Mass Index (BMI) 40.0 and over, adult: Secondary | ICD-10-CM | POA: Diagnosis not present

## 2018-01-01 DIAGNOSIS — I890 Lymphedema, not elsewhere classified: Secondary | ICD-10-CM | POA: Diagnosis not present

## 2018-01-01 DIAGNOSIS — I87332 Chronic venous hypertension (idiopathic) with ulcer and inflammation of left lower extremity: Secondary | ICD-10-CM | POA: Diagnosis not present

## 2018-01-01 DIAGNOSIS — I87312 Chronic venous hypertension (idiopathic) with ulcer of left lower extremity: Secondary | ICD-10-CM | POA: Diagnosis not present

## 2018-01-01 DIAGNOSIS — S81802A Unspecified open wound, left lower leg, initial encounter: Secondary | ICD-10-CM | POA: Diagnosis not present

## 2018-01-01 DIAGNOSIS — I10 Essential (primary) hypertension: Secondary | ICD-10-CM | POA: Diagnosis not present

## 2018-01-08 DIAGNOSIS — L97822 Non-pressure chronic ulcer of other part of left lower leg with fat layer exposed: Secondary | ICD-10-CM | POA: Diagnosis not present

## 2018-01-08 DIAGNOSIS — Z6841 Body Mass Index (BMI) 40.0 and over, adult: Secondary | ICD-10-CM | POA: Diagnosis not present

## 2018-01-08 DIAGNOSIS — I10 Essential (primary) hypertension: Secondary | ICD-10-CM | POA: Diagnosis not present

## 2018-01-08 DIAGNOSIS — I890 Lymphedema, not elsewhere classified: Secondary | ICD-10-CM | POA: Diagnosis not present

## 2018-01-08 DIAGNOSIS — I87332 Chronic venous hypertension (idiopathic) with ulcer and inflammation of left lower extremity: Secondary | ICD-10-CM | POA: Diagnosis not present

## 2018-01-08 DIAGNOSIS — L97222 Non-pressure chronic ulcer of left calf with fat layer exposed: Secondary | ICD-10-CM | POA: Diagnosis not present

## 2018-01-17 ENCOUNTER — Encounter (HOSPITAL_BASED_OUTPATIENT_CLINIC_OR_DEPARTMENT_OTHER): Payer: 59 | Attending: Internal Medicine

## 2018-01-17 DIAGNOSIS — I87312 Chronic venous hypertension (idiopathic) with ulcer of left lower extremity: Secondary | ICD-10-CM | POA: Diagnosis not present

## 2018-01-17 DIAGNOSIS — S81802A Unspecified open wound, left lower leg, initial encounter: Secondary | ICD-10-CM | POA: Diagnosis not present

## 2018-01-17 DIAGNOSIS — L97822 Non-pressure chronic ulcer of other part of left lower leg with fat layer exposed: Secondary | ICD-10-CM | POA: Insufficient documentation

## 2018-01-17 DIAGNOSIS — I890 Lymphedema, not elsewhere classified: Secondary | ICD-10-CM | POA: Insufficient documentation

## 2018-01-17 DIAGNOSIS — I10 Essential (primary) hypertension: Secondary | ICD-10-CM | POA: Insufficient documentation

## 2018-01-17 DIAGNOSIS — I87332 Chronic venous hypertension (idiopathic) with ulcer and inflammation of left lower extremity: Secondary | ICD-10-CM | POA: Diagnosis not present

## 2018-01-17 DIAGNOSIS — L97222 Non-pressure chronic ulcer of left calf with fat layer exposed: Secondary | ICD-10-CM | POA: Diagnosis not present

## 2018-01-24 DIAGNOSIS — L97222 Non-pressure chronic ulcer of left calf with fat layer exposed: Secondary | ICD-10-CM | POA: Diagnosis not present

## 2018-01-24 DIAGNOSIS — I10 Essential (primary) hypertension: Secondary | ICD-10-CM | POA: Diagnosis not present

## 2018-01-24 DIAGNOSIS — I87312 Chronic venous hypertension (idiopathic) with ulcer of left lower extremity: Secondary | ICD-10-CM | POA: Diagnosis not present

## 2018-01-24 DIAGNOSIS — L97822 Non-pressure chronic ulcer of other part of left lower leg with fat layer exposed: Secondary | ICD-10-CM | POA: Diagnosis not present

## 2018-01-24 DIAGNOSIS — I87332 Chronic venous hypertension (idiopathic) with ulcer and inflammation of left lower extremity: Secondary | ICD-10-CM | POA: Diagnosis not present

## 2018-01-24 DIAGNOSIS — I890 Lymphedema, not elsewhere classified: Secondary | ICD-10-CM | POA: Diagnosis not present

## 2018-01-29 DIAGNOSIS — I890 Lymphedema, not elsewhere classified: Secondary | ICD-10-CM | POA: Diagnosis not present

## 2018-01-29 DIAGNOSIS — I10 Essential (primary) hypertension: Secondary | ICD-10-CM | POA: Diagnosis not present

## 2018-01-29 DIAGNOSIS — L97822 Non-pressure chronic ulcer of other part of left lower leg with fat layer exposed: Secondary | ICD-10-CM | POA: Diagnosis not present

## 2018-01-29 DIAGNOSIS — I87332 Chronic venous hypertension (idiopathic) with ulcer and inflammation of left lower extremity: Secondary | ICD-10-CM | POA: Diagnosis not present

## 2018-01-30 MED FILL — LEVOTHYROXINE 112 MCG TAB: 112 | 90 days supply | Qty: 90 | Fill #1

## 2018-02-06 ENCOUNTER — Ambulatory Visit (INDEPENDENT_AMBULATORY_CARE_PROVIDER_SITE_OTHER): Payer: 59 | Admitting: Surgery

## 2018-02-06 ENCOUNTER — Other Ambulatory Visit: Payer: Self-pay

## 2018-02-06 ENCOUNTER — Encounter: Payer: Self-pay | Admitting: Surgery

## 2018-02-06 VITALS — BP 112/74 | HR 97 | Temp 98.0°F | Resp 20 | Ht 64.0 in | Wt 231.0 lb

## 2018-02-06 DIAGNOSIS — R600 Localized edema: Secondary | ICD-10-CM

## 2018-02-06 NOTE — Progress Notes (Signed)
[facility] [place]  Patient name: [name] MRN: [id number] DOB: 1955-06-15 Sex: female   REQUESTING PROVIDER:    Dr. Dellia Nims   REASON FOR CONSULT:    Abnormal venous studies  HISTORY OF PRESENT ILLNESS:   [name] is a 62 y.o. female, who is referred today for evaluation of a chronic left leg wound and swelling.  The patient works in Press photographer at [employer].  She fell on a gravel driveway in July 0962 and suffered injuries to her lower left leg.  She has been treated with antibiotics.  Her wounds were nearly circumferential.  She has been treated at the wound center.  She is now on compression and her wounds are almost nearly healed.  She had an abnormal venous reflux study.  The patient has a history of weeping legs and the family.  She has skin discoloration which the patient feels is related to Schamberg's disease.  She had an ABI done at the wound center which was 1.3 on the left.  The patient is a non-smoker.  She is medically managed for hypertension with an ACE inhibitor.  She takes a statin for hypercholesterolemia.  She is a prediabetic.  PAST MEDICAL HISTORY    Past Medical History:  Diagnosis Date  . Arthritis   . DOE (dyspnea on exertion)    mild  . Hyperlipidemia   . Hypertension   . Hypothyroidism   . Morbid obesity ([place])   . Pre-diabetes   . Schamberg disease      FAMILY HISTORY   Family History  Problem Relation Age of Onset  . Hypertension Mother   . Heart Problems Mother        Pacemaker  . Heart attack Father        at age 64.  Marland Kitchen Heart attack Maternal Grandfather 71       Died of AAA  . AAA (abdominal aortic aneurysm) Maternal Grandfather   . Heart attack Maternal Uncle   . Stroke Paternal Aunt   . Alzheimer's disease Maternal Grandmother     SOCIAL HISTORY:   Social History   Socioeconomic History  . Marital status: Single    Spouse name: Not on file  . Number  of children: Not on file  . Years of education: Not on file  . Highest education level: Not on file  Occupational History  . Not on file  Social Needs  . Financial resource strain: Not on file  . Food insecurity:    Worry: Not on file    Inability: Not on file  . Transportation needs:    Medical: Not on file    Non-medical: Not on file  Tobacco Use  . Smoking status: Never Smoker  . Smokeless tobacco: Never Used  Substance and Sexual Activity  . Alcohol use: No  . Drug use: No  . Sexual activity: Not on file  Lifestyle  . Physical activity:    Days per week: Not on file    Minutes per session: Not on file  . Stress: Not on file  Relationships  . Social connections:    Talks on phone: Not on file    Gets together: Not on file    Attends religious service: Not on file    Active member of club or organization: Not on file    Attends meetings of clubs or organizations: Not on file    Relationship status: Not on file  . Intimate partner violence:  Fear of current or ex partner: Not on file    Emotionally abused: Not on file    Physically abused: Not on file    Forced sexual activity: Not on file  Other Topics Concern  . Not on file  Social History Narrative  . Not on file    ALLERGIES:    No Known Allergies  CURRENT MEDICATIONS:    Current Outpatient Medications  Medication Sig Dispense Refill  . Calcium Carbonate-Vitamin D (CALCIUM 600+D) 600-400 MG-UNIT tablet Take 1 tablet by mouth daily.    . Cholecalciferol (VITAMIN D) 2000 units tablet Take 4,000 Units by mouth daily.    Marland Kitchen levothyroxine (SYNTHROID, LEVOTHROID) 112 MCG tablet Take 112 mcg by mouth daily before breakfast.    . lisinopril (PRINIVIL,ZESTRIL) 20 MG tablet Take 20 mg by mouth daily.    . Multiple Vitamins-Minerals (MULTIVITAMIN WITH MINERALS) tablet Take 1 tablet by mouth daily.    . potassium chloride SA (K-DUR,KLOR-CON) 20 MEQ tablet Take 20 mEq by mouth daily as needed (when taking  Torsemide).     . simvastatin (ZOCOR) 20 MG tablet TAKE 1 TABLET BY MOUTH DAILY. 90 tablet 0  . torsemide (DEMADEX) 20 MG tablet Take 20 mg by mouth daily as needed (for edema).      No current facility-administered medications for this visit.     REVIEW OF SYSTEMS:   [X]  denotes positive finding, [ ]  denotes negative finding Cardiac  Comments:  Chest pain or chest pressure:    Shortness of breath upon exertion:    Short of breath when lying flat:    Irregular heart rhythm:        Vascular    Pain in calf, thigh, or hip brought on by ambulation:    Pain in feet at night that wakes you up from your sleep:     Blood clot in your veins:    Leg swelling:  x       Pulmonary    Oxygen at home:    Productive cough:     Wheezing:         Neurologic    Sudden weakness in arms or legs:     Sudden numbness in arms or legs:     Sudden onset of difficulty speaking or slurred speech:    Temporary loss of vision in one eye:     Problems with dizziness:         Gastrointestinal    Blood in stool:      Vomited blood:         Genitourinary    Burning when urinating:     Blood in urine:        Psychiatric    Major depression:         Hematologic    Bleeding problems:    Problems with blood clotting too easily:        Skin    Rashes or ulcers:        Constitutional    Fever or chills:     PHYSICAL EXAM:   Vitals:   02/06/18 1131  BP: 112/74  Pulse: 97  Resp: 20  Temp: 98 F (36.7 C)  SpO2: 98%  Weight: 231 lb (104.8 kg)  Height: 5\' 4"  (1.626 m)    GENERAL: The patient is a well-nourished female, in no acute distress. The vital signs are documented above. CARDIAC: There is a regular rate and rhythm.  VASCULAR: Brisk dorsalis pedis Doppler signal.  2+ pitting edema.  PULMONARY: Nonlabored respirations  MUSCULOSKELETAL: There are no major deformities or cyanosis. NEUROLOGIC: No focal weakness or paresthesias are detected. SKIN: Small lateral left leg ulcer (see  picture). PSYCHIATRIC: The patient has a normal affect.    STUDIES:   I have reviewed her vascular lab studies with the following findings: Venous Reflux Times Normal value < 0.5 sec +------------------------------+----------+---------+                Right (ms)Left (ms) +------------------------------+----------+---------+ CFV                   3711.00  +------------------------------+----------+---------+ Popliteal           3829.00  2303.00  +------------------------------+----------+---------+ GSV at Saphenofemoral junction1944.00  3924.00  +------------------------------+----------+---------+ GSV prox thigh        4724.00        +------------------------------+----------+---------+ GSV mid thigh              2318.00  +------------------------------+----------+---------+ GSV mid calf         1210.00        +------------------------------+----------+---------+ SSV origin          3873.00        +------------------------------+----------+---------+  Vein Diameters: +------------------------------+----------+---------+                Right (cm)Left (cm) +------------------------------+----------+---------+ GSV at Saphenofemoral junction0.83   1.43    +------------------------------+----------+---------+ GSV at prox thigh       0.54   0.83    +------------------------------+----------+---------+ GSV at mid thigh       0.29   0.73    +------------------------------+----------+---------+ GSV at distal thigh      0.57   0.27    +------------------------------+----------+---------+ GSV at knee          0.39   0.32    +------------------------------+----------+---------+ GSV prox calf         0.35   0.46     +------------------------------+----------+---------+ GSV mid calf         0.46   0.45    +------------------------------+----------+---------+ SSV origin          0.22   0.20    +------------------------------+----------+---------+ SSV prox           0.21   0.30    +------------------------------+----------+---------+ SSV mid            0.25   0.26    +------------------------------+----------+---------+    Summary: Right: Abnormal reflux times were noted in the popliteal vein, great saphenous vein at the saphenofemoral junction, great saphenous vein at the proximal thigh, great saphenous vein at the mid calf, and origin of the small saphenous vein. There is no  evidence of superficial venous thrombosis. No evidence of deep vein thrombosis in the common femoral, femoral, and popliteal veins. Left: Abnormal reflux times were noted in the common femoral vein, popliteal vein, great saphenous vein at the saphenofemoral junction, and great saphenous vein at the mid thigh. There is no evidence of superficial venous thrombosis. No evidence of deep  vein thrombosis in the common femoral, femoral, and popliteal veins.  ASSESSMENT and PLAN   CEAP class 6: The patient has a nonhealing wound on the left leg since July.  Fortunately it has improved with compression and treatment at the wound center.  She had an abnormal venous reflux evaluation and is here today for further discussions.  She has swelling in both of her legs.  Fortunately, her wound has nearly healed.  I have discussed her ultrasound with her  which shows significant reflux in the left saphenous veins with diameter measurements in the 0.7 x 0.8 cm range.  I think she would benefit from laser ablation to help minimize the risk of ulcer recurrence.  I am also making sure that she wears 20-30 thigh-high compression stockings.  I am scheduling her to come back in 4-6  weeks to have discussion regarding the possibility of laser ablation as well as a wound check   Annamarie Major, MD [facility] [phone] Pager [phone]

## 2018-02-07 DIAGNOSIS — L97222 Non-pressure chronic ulcer of left calf with fat layer exposed: Secondary | ICD-10-CM | POA: Diagnosis not present

## 2018-02-07 DIAGNOSIS — I890 Lymphedema, not elsewhere classified: Secondary | ICD-10-CM | POA: Diagnosis not present

## 2018-02-07 DIAGNOSIS — L97822 Non-pressure chronic ulcer of other part of left lower leg with fat layer exposed: Secondary | ICD-10-CM | POA: Diagnosis not present

## 2018-02-07 DIAGNOSIS — I10 Essential (primary) hypertension: Secondary | ICD-10-CM | POA: Diagnosis not present

## 2018-02-07 DIAGNOSIS — I87312 Chronic venous hypertension (idiopathic) with ulcer of left lower extremity: Secondary | ICD-10-CM | POA: Diagnosis not present

## 2018-02-07 DIAGNOSIS — I87332 Chronic venous hypertension (idiopathic) with ulcer and inflammation of left lower extremity: Secondary | ICD-10-CM | POA: Diagnosis not present

## 2018-02-17 ENCOUNTER — Encounter (HOSPITAL_BASED_OUTPATIENT_CLINIC_OR_DEPARTMENT_OTHER): Payer: 59 | Attending: Internal Medicine

## 2018-02-17 DIAGNOSIS — L97229 Non-pressure chronic ulcer of left calf with unspecified severity: Secondary | ICD-10-CM | POA: Diagnosis not present

## 2018-02-17 DIAGNOSIS — L97221 Non-pressure chronic ulcer of left calf limited to breakdown of skin: Secondary | ICD-10-CM | POA: Diagnosis not present

## 2018-02-17 DIAGNOSIS — I87332 Chronic venous hypertension (idiopathic) with ulcer and inflammation of left lower extremity: Secondary | ICD-10-CM | POA: Diagnosis not present

## 2018-02-17 DIAGNOSIS — I10 Essential (primary) hypertension: Secondary | ICD-10-CM | POA: Diagnosis not present

## 2018-02-17 DIAGNOSIS — I890 Lymphedema, not elsewhere classified: Secondary | ICD-10-CM | POA: Insufficient documentation

## 2018-02-17 DIAGNOSIS — I87312 Chronic venous hypertension (idiopathic) with ulcer of left lower extremity: Secondary | ICD-10-CM | POA: Diagnosis not present

## 2018-02-24 DIAGNOSIS — I890 Lymphedema, not elsewhere classified: Secondary | ICD-10-CM | POA: Diagnosis not present

## 2018-02-24 DIAGNOSIS — I10 Essential (primary) hypertension: Secondary | ICD-10-CM | POA: Diagnosis not present

## 2018-02-24 DIAGNOSIS — L97222 Non-pressure chronic ulcer of left calf with fat layer exposed: Secondary | ICD-10-CM | POA: Diagnosis not present

## 2018-02-24 DIAGNOSIS — I87332 Chronic venous hypertension (idiopathic) with ulcer and inflammation of left lower extremity: Secondary | ICD-10-CM | POA: Diagnosis not present

## 2018-02-24 DIAGNOSIS — L97221 Non-pressure chronic ulcer of left calf limited to breakdown of skin: Secondary | ICD-10-CM | POA: Diagnosis not present

## 2018-02-24 DIAGNOSIS — I87312 Chronic venous hypertension (idiopathic) with ulcer of left lower extremity: Secondary | ICD-10-CM | POA: Diagnosis not present

## 2018-03-06 DIAGNOSIS — I890 Lymphedema, not elsewhere classified: Secondary | ICD-10-CM | POA: Diagnosis not present

## 2018-03-06 DIAGNOSIS — I10 Essential (primary) hypertension: Secondary | ICD-10-CM | POA: Diagnosis not present

## 2018-03-06 DIAGNOSIS — I87332 Chronic venous hypertension (idiopathic) with ulcer and inflammation of left lower extremity: Secondary | ICD-10-CM | POA: Diagnosis not present

## 2018-03-06 DIAGNOSIS — S81802A Unspecified open wound, left lower leg, initial encounter: Secondary | ICD-10-CM | POA: Diagnosis not present

## 2018-03-06 DIAGNOSIS — L97221 Non-pressure chronic ulcer of left calf limited to breakdown of skin: Secondary | ICD-10-CM | POA: Diagnosis not present

## 2018-03-10 ENCOUNTER — Encounter: Payer: Self-pay | Admitting: Surgery

## 2018-03-10 ENCOUNTER — Other Ambulatory Visit: Payer: Self-pay

## 2018-03-10 ENCOUNTER — Ambulatory Visit (INDEPENDENT_AMBULATORY_CARE_PROVIDER_SITE_OTHER): Payer: 59 | Admitting: Surgery

## 2018-03-10 VITALS — BP 115/64 | HR 84 | Temp 97.5°F | Resp 20 | Ht 64.0 in | Wt 231.0 lb

## 2018-03-10 DIAGNOSIS — R600 Localized edema: Secondary | ICD-10-CM | POA: Diagnosis not present

## 2018-03-10 NOTE — Progress Notes (Signed)
[facility]ist of [place]  Patient name: [name] MRN: [id number] DOB: 1955-06-13 Sex: female   REASON FOR VISIT:    Follow up  HISOTRY OF PRESENT ILLNESS:    [name] is a 63 y.o. female who I initially saw at the request of the wound center for evaluation of a chronic left leg wound and swelling.  The patient works in Press photographer at [employer].  She fell on a gravel driveway on July 3267 and injured her lower left leg.  Her wounds initially were almost circumferential.  She was treated at the wound center with antibiotics and wound care.  Her wounds have nearly healed.  She is using compression.  She did have an abnormal venous reflux study.  Her wounds have now healed.  She is able to wear compression socks.  The patient has a history of weeping legs and the family.  She has skin discoloration which the patient feels is related to Schamberg's disease.  She had an ABI done at the wound center which was 1.3 on the left.  The patient is a non-smoker.  She is medically managed for hypertension with an ACE inhibitor.  She takes a statin for hypercholesterolemia.  She is a prediabetic.  PAST MEDICAL HISTORY:   Past Medical History:  Diagnosis Date  . Arthritis   . DOE (dyspnea on exertion)    mild  . Hyperlipidemia   . Hypertension   . Hypothyroidism   . Morbid obesity ([place])   . Pre-diabetes   . Schamberg disease      FAMILY HISTORY:   Family History  Problem Relation Age of Onset  . Hypertension Mother   . Heart Problems Mother        Pacemaker  . Heart attack Father        at age 41.  Marland Kitchen Heart attack Maternal Grandfather 66       Died of AAA  . AAA (abdominal aortic aneurysm) Maternal Grandfather   . Heart attack Maternal Uncle   . Stroke Paternal Aunt   . Alzheimer's disease Maternal Grandmother     SOCIAL HISTORY:   Social History   Tobacco Use  . Smoking status: Never Smoker  . Smokeless  tobacco: Never Used  Substance Use Topics  . Alcohol use: No     ALLERGIES:   No Known Allergies   CURRENT MEDICATIONS:   Current Outpatient Medications  Medication Sig Dispense Refill  . Calcium Carbonate-Vitamin D (CALCIUM 600+D) 600-400 MG-UNIT tablet Take 1 tablet by mouth daily.    . Cholecalciferol (VITAMIN D) 2000 units tablet Take 4,000 Units by mouth daily.    Marland Kitchen levothyroxine (SYNTHROID, LEVOTHROID) 112 MCG tablet Take 112 mcg by mouth daily before breakfast.    . lisinopril (PRINIVIL,ZESTRIL) 20 MG tablet Take 20 mg by mouth daily.    . Multiple Vitamins-Minerals (MULTIVITAMIN WITH MINERALS) tablet Take 1 tablet by mouth daily.    . potassium chloride SA (K-DUR,KLOR-CON) 20 MEQ tablet Take 20 mEq by mouth daily as needed (when taking Torsemide).     . simvastatin (ZOCOR) 20 MG tablet TAKE 1 TABLET BY MOUTH DAILY. 90 tablet 0  . torsemide (DEMADEX) 20 MG tablet Take 20 mg by mouth daily as needed (for edema).      No current facility-administered medications for this visit.     REVIEW OF SYSTEMS:   [X]  denotes positive finding, [ ]  denotes negative finding Cardiac  Comments:  Chest pain or chest pressure:  Shortness of breath upon exertion:    Short of breath when lying flat:    Irregular heart rhythm:        Vascular    Pain in calf, thigh, or hip brought on by ambulation:    Pain in feet at night that wakes you up from your sleep:     Blood clot in your veins:    Leg swelling:  x       Pulmonary    Oxygen at home:    Productive cough:     Wheezing:         Neurologic    Sudden weakness in arms or legs:     Sudden numbness in arms or legs:     Sudden onset of difficulty speaking or slurred speech:    Temporary loss of vision in one eye:     Problems with dizziness:         Gastrointestinal    Blood in stool:     Vomited blood:         Genitourinary    Burning when urinating:     Blood in urine:        Psychiatric    Major depression:           Hematologic    Bleeding problems:    Problems with blood clotting too easily:        Skin    Rashes or ulcers:        Constitutional    Fever or chills:      PHYSICAL EXAM:   Vitals:   03/10/18 1343  BP: 115/64  Pulse: 84  Resp: 20  Temp: (!) 97.5 F (36.4 C)  SpO2: 98%  Weight: 231 lb (104.8 kg)  Height: 5\' 4"  (1.626 m)    GENERAL: The patient is a well-nourished female, in no acute distress. The vital signs are documented above. CARDIAC: There is a regular rate and rhythm.  PULMONARY: Non-labored respirations MUSCULOSKELETAL: There are no major deformities or cyanosis. NEUROLOGIC: No focal weakness or paresthesias are detected. SKIN: There are no ulcers or rashes noted. PSYCHIATRIC: The patient has a normal affect.  STUDIES:   None  MEDICAL ISSUES:   CEAP class 5: Her wounds have now healed.  She is able to tolerate compression socks.  We discussed endovenous laser ablation versus continuing to wear compression.  At this time she is comfortable just wearing compression socks, especially since her wound was traumatic in origin.Marland Kitchen  She will contact me if she develops another wound.  I will see her back on an as-needed basis.    Annamarie Major, MD [facility] [phone] Pager [phone]

## 2018-03-12 ENCOUNTER — Other Ambulatory Visit: Payer: Self-pay | Admitting: Nurse Practitioner

## 2018-03-14 ENCOUNTER — Other Ambulatory Visit: Payer: Self-pay | Admitting: Internal Medicine

## 2018-03-14 MED FILL — SIMVASTATIN 20 MG TABLET: 20 | 90 days supply | Qty: 90 | Fill #0

## 2018-03-14 MED FILL — LISINOPRIL 20 MG TABLET: 20 | 90 days supply | Qty: 90 | Fill #0

## 2018-03-26 ENCOUNTER — Encounter: Payer: Self-pay | Admitting: Internal Medicine

## 2018-03-26 ENCOUNTER — Ambulatory Visit: Payer: 59 | Admitting: Internal Medicine

## 2018-03-26 VITALS — BP 112/74 | HR 103 | Temp 97.4°F | Ht 64.0 in | Wt 241.2 lb

## 2018-03-26 DIAGNOSIS — E039 Hypothyroidism, unspecified: Secondary | ICD-10-CM | POA: Diagnosis not present

## 2018-03-26 DIAGNOSIS — R7309 Other abnormal glucose: Secondary | ICD-10-CM | POA: Diagnosis not present

## 2018-03-26 DIAGNOSIS — I129 Hypertensive chronic kidney disease with stage 1 through stage 4 chronic kidney disease, or unspecified chronic kidney disease: Secondary | ICD-10-CM | POA: Diagnosis not present

## 2018-03-26 DIAGNOSIS — N182 Chronic kidney disease, stage 2 (mild): Secondary | ICD-10-CM | POA: Diagnosis not present

## 2018-03-26 DIAGNOSIS — Z6841 Body Mass Index (BMI) 40.0 and over, adult: Secondary | ICD-10-CM | POA: Diagnosis not present

## 2018-03-26 DIAGNOSIS — E66813 Obesity, class 3: Secondary | ICD-10-CM

## 2018-03-26 MED ORDER — CEPHALEXIN 500 MG PO CAPS: 500.0000 mg | ORAL_CAPSULE | Freq: Three times a day (TID) | ORAL | 0 refills | Status: AC

## 2018-03-26 MED FILL — CEPHALEXIN 500 MG CAPSULE: 500 | 10 days supply | Qty: 30 | Fill #0

## 2018-03-27 LAB — CMP14+EGFR
A/G RATIO: 1.5 (ref 1.2–2.2)
ALT: 13 IU/L (ref 0–32)
AST: 14 IU/L (ref 0–40)
Albumin: 4.6 g/dL (ref 3.8–4.8)
Alkaline Phosphatase: 96 IU/L (ref 39–117)
BUN/Creatinine Ratio: 16 (ref 12–28)
BUN: 20 mg/dL (ref 8–27)
Bilirubin Total: 0.4 mg/dL (ref 0.0–1.2)
CALCIUM: 9.7 mg/dL (ref 8.7–10.3)
CO2: 22 mmol/L (ref 20–29)
CREATININE: 1.28 mg/dL — AB (ref 0.57–1.00)
Chloride: 99 mmol/L (ref 96–106)
GFR, EST AFRICAN AMERICAN: 51 mL/min/{1.73_m2} — AB (ref >59)
GFR, EST NON AFRICAN AMERICAN: 44 mL/min/{1.73_m2} — AB (ref >59)
Globulin, Total: 3.1 g/dL (ref 1.5–4.5)
Glucose: 118 mg/dL — ABNORMAL HIGH (ref 65–99)
POTASSIUM: 4.5 mmol/L (ref 3.5–5.2)
Sodium: 141 mmol/L (ref 134–144)
TOTAL PROTEIN: 7.7 g/dL (ref 6.0–8.5)

## 2018-03-27 LAB — T4, FREE: FREE T4: 1.6 ng/dL (ref 0.82–1.77)

## 2018-03-27 LAB — HEMOGLOBIN A1C
ESTIMATED AVERAGE GLUCOSE: 123 mg/dL
Hgb A1c MFr Bld: 5.9 % — ABNORMAL HIGH (ref 4.8–5.6)

## 2018-03-27 LAB — TSH: TSH: 1.98 u[IU]/mL (ref 0.450–4.500)

## 2018-03-29 NOTE — Progress Notes (Signed)
Subjective:     Patient ID: [name] , female    DOB: [birth year] , 63 y.o.   MRN: [id number]   Chief Complaint  Patient presents with  . Hypertension    HPI  Hypertension  This is a chronic problem. The current episode started more than 1 year ago. The problem has been gradually improving since onset. The problem is controlled. Pertinent negatives include no blurred vision, chest pain, palpitations or shortness of breath. Risk factors for coronary artery disease include obesity, sedentary lifestyle and post-menopausal state. Compliance problems include exercise.    She reports compliance with medication.   Past Medical History:  Diagnosis Date  . Arthritis   . DOE (dyspnea on exertion)    mild  . Hyperlipidemia   . Hypertension   . Hypothyroidism   . Morbid obesity ([place])   . Pre-diabetes   . Schamberg disease      Family History  Problem Relation Age of Onset  . Hypertension Mother   . Heart Problems Mother        Pacemaker  . Heart attack Father        at age 40.  Marland Kitchen Heart attack Maternal Grandfather 74       Died of AAA  . AAA (abdominal aortic aneurysm) Maternal Grandfather   . Heart attack Maternal Uncle   . Stroke Paternal Aunt   . Alzheimer's disease Maternal Grandmother      Current Outpatient Medications:  .  Calcium Carbonate-Vitamin D (CALCIUM 600+D) 600-400 MG-UNIT tablet, Take 1 tablet by mouth daily., Disp: , Rfl:  .  Cholecalciferol (VITAMIN D) 2000 units tablet, Take 4,000 Units by mouth daily., Disp: , Rfl:  .  levothyroxine (SYNTHROID, LEVOTHROID) 112 MCG tablet, Take 112 mcg by mouth daily before breakfast., Disp: , Rfl:  .  lisinopril (PRINIVIL,ZESTRIL) 20 MG tablet, TAKE 1 TABLET BY MOUTH ONCE DAILY, Disp: 90 tablet, Rfl: 0 .  Multiple Vitamins-Minerals (MULTIVITAMIN WITH MINERALS) tablet, Take 1 tablet by mouth daily., Disp: , Rfl:  .  potassium chloride SA (K-DUR,KLOR-CON) 20 MEQ tablet, Take 20 mEq by mouth daily as needed (when taking  Torsemide). , Disp: , Rfl:  .  simvastatin (ZOCOR) 20 MG tablet, TAKE 1 TABLET BY MOUTH DAILY., Disp: 90 tablet, Rfl: 0 .  torsemide (DEMADEX) 20 MG tablet, Take 20 mg by mouth daily as needed (for edema). , Disp: , Rfl:  .  cephALEXin (KEFLEX) 500 MG capsule, Take 1 capsule (500 mg total) by mouth 3 (three) times daily for 10 days., Disp: 30 capsule, Rfl: 0   No Known Allergies   Review of Systems  Constitutional: Negative.   Eyes: Negative for blurred vision.  Respiratory: Negative.  Negative for shortness of breath.   Cardiovascular: Negative.  Negative for chest pain and palpitations.  Gastrointestinal: Negative.   Neurological: Negative.      Today's Vitals   03/26/18 1412  BP: 112/74  Pulse: (!) 103  Temp: (!) 97.4 F (36.3 C)  TempSrc: Oral  Weight: 241 lb 3.2 oz (109.4 kg)  Height: 5' 4"  (1.626 m)   Body mass index is 41.4 kg/m.   Objective:  Physical Exam Vitals signs and nursing note reviewed.  Constitutional:      Appearance: Normal appearance. She is obese.  HENT:     Head: Normocephalic and atraumatic.  Cardiovascular:     Rate and Rhythm: Normal rate and regular rhythm.     Heart sounds: Normal heart sounds.  Pulmonary:  Effort: Pulmonary effort is normal.     Breath sounds: Normal breath sounds.  Skin:    General: Skin is warm.  Neurological:     General: No focal deficit present.     Mental Status: She is alert.         Assessment And Plan:     1. Parenchymal renal hypertension, stage 1 through stage 4 or unspecified chronic kidney disease  Well controlled. She will continue with current meds. She is encouraged to avoid adding salt to her foods. She will rto in six months for her next evaluation.   - CMP14+EGFR  2. Chronic renal disease, stage II  Chronic. She is encouraged to stay well hydrated. I will check GFR, Cr today.   3. Primary hypothyroidism  I will check a thyroid panel and adjust meds as needed.   - TSH - T4,  Free  4. Other abnormal glucose  HER A1C HAS BEEN ELEVATED IN THE PAST. I WILL CHECK AN A1C, BMET TODAY. SHE WAS ENCOURAGED TO AVOID SUGARY BEVERAGES AND PROCESSED FOODS INCLUDNG BREADS, RICE AND PASTA.  - Hemoglobin A1c  5. Class 3 severe obesity due to excess calories with serious comorbidity and body mass index (BMI) of 45.0 to 49.9 in adult [facility])  Importance of achieving optimal weight to decrease risk of cardiovascular disease and cancers was discussed with the patient in full detail. She is encouraged to start slowly - start with 10 minutes twice daily at least three to four days per week and to gradually build to 30 minutes five days weekly. She was given tips to incorporate more activity into her daily routine - take stairs when possible, park farther away from her job, grocery stores, etc. .     [name], MD

## 2018-04-28 ENCOUNTER — Encounter: Payer: Self-pay | Admitting: Internal Medicine

## 2018-07-24 ENCOUNTER — Other Ambulatory Visit: Payer: Self-pay | Admitting: Internal Medicine

## 2018-07-25 MED FILL — LEVOTHYROXINE 112 MCG TAB: 112 | 90 days supply | Qty: 90 | Fill #0

## 2018-09-11 ENCOUNTER — Other Ambulatory Visit: Payer: Self-pay | Admitting: Internal Medicine

## 2018-09-11 MED FILL — LISINOPRIL 20 MG TABLET: 20 | 90 days supply | Qty: 90 | Fill #0

## 2018-09-30 ENCOUNTER — Other Ambulatory Visit: Payer: Self-pay | Admitting: Internal Medicine

## 2018-10-01 MED FILL — SIMVASTATIN 20 MG TABLET: 20 | 90 days supply | Qty: 90 | Fill #0

## 2018-10-27 ENCOUNTER — Ambulatory Visit: Payer: 59 | Admitting: Internal Medicine

## 2018-10-27 ENCOUNTER — Encounter: Payer: Self-pay | Admitting: Internal Medicine

## 2018-10-27 ENCOUNTER — Other Ambulatory Visit: Payer: Self-pay

## 2018-10-27 VITALS — BP 140/80 | HR 85 | Temp 98.1°F | Ht 64.0 in | Wt 243.6 lb

## 2018-10-27 DIAGNOSIS — N182 Chronic kidney disease, stage 2 (mild): Secondary | ICD-10-CM

## 2018-10-27 DIAGNOSIS — Z23 Encounter for immunization: Secondary | ICD-10-CM | POA: Diagnosis not present

## 2018-10-27 DIAGNOSIS — I739 Peripheral vascular disease, unspecified: Secondary | ICD-10-CM | POA: Diagnosis not present

## 2018-10-27 DIAGNOSIS — E2839 Other primary ovarian failure: Secondary | ICD-10-CM

## 2018-10-27 DIAGNOSIS — T148XXA Other injury of unspecified body region, initial encounter: Secondary | ICD-10-CM

## 2018-10-27 DIAGNOSIS — Z0000 Encounter for general adult medical examination without abnormal findings: Secondary | ICD-10-CM | POA: Diagnosis not present

## 2018-10-27 DIAGNOSIS — I129 Hypertensive chronic kidney disease with stage 1 through stage 4 chronic kidney disease, or unspecified chronic kidney disease: Secondary | ICD-10-CM

## 2018-10-27 DIAGNOSIS — L24A9 Irritant contact dermatitis due friction or contact with other specified body fluids: Secondary | ICD-10-CM

## 2018-10-27 LAB — POCT URINALYSIS DIPSTICK
Bilirubin, UA: NEGATIVE
Glucose, UA: NEGATIVE
Ketones, UA: NEGATIVE
Nitrite, UA: NEGATIVE
Protein, UA: NEGATIVE
Spec Grav, UA: 1.01 (ref 1.010–1.025)
Urobilinogen, UA: 0.2 E.U./dL
pH, UA: 5.5 (ref 5.0–8.0)

## 2018-10-27 LAB — POCT UA - MICROALBUMIN
Albumin/Creatinine Ratio, Urine, POC: <30
Creatinine, POC: 200 mg/dL
Microalbumin Ur, POC: 10 mg/L

## 2018-10-27 MED ORDER — PNEUMOCOCCAL 13-VAL CONJ VACC IM SUSP: 0.5000 mL | INTRAMUSCULAR | 0 refills | Status: AC

## 2018-10-27 MED ORDER — METRONIDAZOLE 500 MG PO TABS: ORAL_TABLET | ORAL | 0 refills | Status: DC

## 2018-10-27 MED FILL — metroNIDAZOLE 500 MG TABS: 500 | 7 days supply | Qty: 14 | Fill #0

## 2018-10-27 NOTE — Patient Instructions (Signed)
Health Maintenance, Female Adopting a healthy lifestyle and getting preventive care are important in promoting health and wellness. Ask your health care provider about:  The right schedule for you to have regular tests and exams.  Things you can do on your own to prevent diseases and keep yourself healthy. What should I know about diet, weight, and exercise? Eat a healthy diet   Eat a diet that includes plenty of vegetables, fruits, low-fat dairy products, and lean protein.  Do not eat a lot of foods that are high in solid fats, added sugars, or sodium. Maintain a healthy weight Body mass index (BMI) is used to identify weight problems. It estimates body fat based on height and weight. Your health care provider can help determine your BMI and help you achieve or maintain a healthy weight. Get regular exercise Get regular exercise. This is one of the most important things you can do for your health. Most adults should:  Exercise for at least 150 minutes each week. The exercise should increase your heart rate and make you sweat (moderate-intensity exercise).  Do strengthening exercises at least twice a week. This is in addition to the moderate-intensity exercise.  Spend less time sitting. Even light physical activity can be beneficial. Watch cholesterol and blood lipids Have your blood tested for lipids and cholesterol at 65 years of age, then have this test every 5 years. Have your cholesterol levels checked more often if:  Your lipid or cholesterol levels are high.  You are older than 65 years of age.  You are at high risk for heart disease. What should I know about cancer screening? Depending on your health history and family history, you may need to have cancer screening at various ages. This may include screening for:  Breast cancer.  Cervical cancer.  Colorectal cancer.  Skin cancer.  Lung cancer. What should I know about heart disease, diabetes, and high blood  pressure? Blood pressure and heart disease  High blood pressure causes heart disease and increases the risk of stroke. This is more likely to develop in people who have high blood pressure readings, are of African descent, or are overweight.  Have your blood pressure checked: ? Every 3-5 years if you are 18-39 years of age. ? Every year if you are 40 years old or older. Diabetes Have regular diabetes screenings. This checks your fasting blood sugar level. Have the screening done:  Once every three years after age 40 if you are at a normal weight and have a low risk for diabetes.  More often and at a younger age if you are overweight or have a high risk for diabetes. What should I know about preventing infection? Hepatitis B If you have a higher risk for hepatitis B, you should be screened for this virus. Talk with your health care provider to find out if you are at risk for hepatitis B infection. Hepatitis C Testing is recommended for:  Everyone born from 1945 through 1965.  Anyone with known risk factors for hepatitis C. Sexually transmitted infections (STIs)  Get screened for STIs, including gonorrhea and chlamydia, if: ? You are sexually active and are younger than 65 years of age. ? You are older than 65 years of age and your health care provider tells you that you are at risk for this type of infection. ? Your sexual activity has changed since you were last screened, and you are at increased risk for chlamydia or gonorrhea. Ask your health care provider if   you are at risk.  Ask your health care provider about whether you are at high risk for HIV. Your health care provider may recommend a prescription medicine to help prevent HIV infection. If you choose to take medicine to prevent HIV, you should first get tested for HIV. You should then be tested every 3 months for as long as you are taking the medicine. Pregnancy  If you are about to stop having your period (premenopausal) and  you may become pregnant, seek counseling before you get pregnant.  Take 400 to 800 micrograms (mcg) of folic acid every day if you become pregnant.  Ask for birth control (contraception) if you want to prevent pregnancy. Osteoporosis and menopause Osteoporosis is a disease in which the bones lose minerals and strength with aging. This can result in bone fractures. If you are 65 years old or older, or if you are at risk for osteoporosis and fractures, ask your health care provider if you should:  Be screened for bone loss.  Take a calcium or vitamin D supplement to lower your risk of fractures.  Be given hormone replacement therapy (HRT) to treat symptoms of menopause. Follow these instructions at home: Lifestyle  Do not use any products that contain nicotine or tobacco, such as cigarettes, e-cigarettes, and chewing tobacco. If you need help quitting, ask your health care provider.  Do not use street drugs.  Do not share needles.  Ask your health care provider for help if you need support or information about quitting drugs. Alcohol use  Do not drink alcohol if: ? Your health care provider tells you not to drink. ? You are pregnant, may be pregnant, or are planning to become pregnant.  If you drink alcohol: ? Limit how much you use to 0-1 drink a day. ? Limit intake if you are breastfeeding.  Be aware of how much alcohol is in your drink. In the [place] one drink equals one 12 oz bottle of beer (355 mL), one 5 oz glass of wine (148 mL), or one 1 oz glass of hard liquor (44 mL). General instructions  Schedule regular health, dental, and eye exams.  Stay current with your vaccines.  Tell your health care provider if: ? You often feel depressed. ? You have ever been abused or do not feel safe at home. Summary  Adopting a healthy lifestyle and getting preventive care are important in promoting health and wellness.  Follow your health care provider's instructions about healthy  diet, exercising, and getting tested or screened for diseases.  Follow your health care provider's instructions on monitoring your cholesterol and blood pressure. This information is not intended to replace advice given to you by your health care provider. Make sure you discuss any questions you have with your health care provider. Document Released: 08/14/2010 Document Revised: 01/22/2018 Document Reviewed: 01/22/2018 Elsevier Patient Education  2020 [employer]  

## 2018-10-27 NOTE — Progress Notes (Signed)
Subjective:     Patient ID: [name] , female    DOB: 09-19-1955 , 63 y.o.   MRN: [id number]   Chief Complaint  Patient presents with  . Annual Exam  . Hypertension    HPI  She is here today for a full physical examination.  She is not followed by GYN. Her last pap smear was in 2018.   Hypertension This is a chronic problem. The current episode started more than 1 year ago. The problem has been gradually improving since onset. The problem is uncontrolled. Pertinent negatives include no blurred vision, chest pain, palpitations or shortness of breath. Risk factors for coronary artery disease include obesity, post-menopausal state and sedentary lifestyle. Past treatments include diuretics and ACE inhibitors. The current treatment provides moderate improvement. Compliance problems include exercise.      Past Medical History:  Diagnosis Date  . Arthritis   . DOE (dyspnea on exertion)    mild  . Hyperlipidemia   . Hypertension   . Hypothyroidism   . Morbid obesity ([place])   . Pre-diabetes   . Schamberg disease      Family History  Problem Relation Age of Onset  . Hypertension Mother   . Heart Problems Mother        Pacemaker  . Heart attack Father        at age 80.  Marland Kitchen Heart attack Maternal Grandfather 59       Died of AAA  . AAA (abdominal aortic aneurysm) Maternal Grandfather   . Heart attack Maternal Uncle   . Stroke Paternal Aunt   . Alzheimer's disease Maternal Grandmother      Current Outpatient Medications:  .  Calcium Carbonate-Vitamin D (CALCIUM 600+D) 600-400 MG-UNIT tablet, Take 1 tablet by mouth daily., Disp: , Rfl:  .  Cholecalciferol (VITAMIN D) 2000 units tablet, Take 4,000 Units by mouth daily., Disp: , Rfl:  .  levothyroxine (SYNTHROID) 112 MCG tablet, TAKE 1 TABLET BY MOUTH DAILY, Disp: 90 tablet, Rfl: 1 .  lisinopril (ZESTRIL) 20 MG tablet, TAKE 1 TABLET BY MOUTH ONCE DAILY, Disp: 90 tablet, Rfl: 0 .  Multiple Vitamins-Minerals (MULTIVITAMIN WITH  MINERALS) tablet, Take 1 tablet by mouth daily., Disp: , Rfl:  .  potassium chloride SA (K-DUR,KLOR-CON) 20 MEQ tablet, Take 20 mEq by mouth daily as needed (when taking Torsemide). , Disp: , Rfl:  .  simvastatin (ZOCOR) 20 MG tablet, TAKE 1 TABLET BY MOUTH DAILY., Disp: 90 tablet, Rfl: 0 .  torsemide (DEMADEX) 20 MG tablet, Take 20 mg by mouth daily as needed (for edema). , Disp: , Rfl:  .  metroNIDAZOLE (FLAGYL) 500 MG tablet, Crush tablet, make paste and apply to wound twice daily, Disp: 14 tablet, Rfl: 0 .  pneumococcal 13-valent conjugate vaccine (PREVNAR 13) SUSP injection, Inject 0.5 mLs into the muscle tomorrow at 10 am for 1 dose., Disp: 0.5 mL, Rfl: 0   No Known Allergies   Review of Systems  Constitutional: Negative.   HENT: Negative.   Eyes: Negative.  Negative for blurred vision.  Respiratory: Negative.  Negative for shortness of breath.   Cardiovascular: Negative.  Negative for chest pain and palpitations.  Endocrine: Negative.   Genitourinary: Negative.   Musculoskeletal: Negative.   Skin: Positive for wound.       She reports having abdominal wound that has yet to heal. She had partial colon resection with colostomy and then reversal in 2019.  There is occasional drainage.   Allergic/Immunologic: Negative.  Neurological: Negative.   Hematological: Negative.   Psychiatric/Behavioral: Negative.      Today's Vitals   10/27/18 0907  BP: 140/80  Pulse: 85  Temp: 98.1 F (36.7 C)  TempSrc: Oral  SpO2: 98%  Weight: 243 lb 9.6 oz (110.5 kg)  Height: 5' 4"  (1.626 m)   Body mass index is 41.81 kg/m.   Objective:  Physical Exam Vitals signs and nursing note reviewed.  Constitutional:      Appearance: Normal appearance. She is obese.     Comments: She is ambulatory with a cane. She was examined in the chair, she had difficulty getting on the exam table.  HENT:     Head: Normocephalic and atraumatic.     Right Ear: Tympanic membrane, ear canal and external ear  normal.     Left Ear: Tympanic membrane, ear canal and external ear normal.     Nose: Nose normal.     Mouth/Throat:     Mouth: Mucous membranes are moist.     Pharynx: Oropharynx is clear.  Eyes:     Extraocular Movements: Extraocular movements intact.     Conjunctiva/sclera: Conjunctivae normal.     Pupils: Pupils are equal, round, and reactive to light.  Neck:     Musculoskeletal: Normal range of motion and neck supple.  Cardiovascular:     Rate and Rhythm: Normal rate and regular rhythm.     Pulses: Normal pulses.     Heart sounds: Normal heart sounds.  Pulmonary:     Effort: Pulmonary effort is normal.     Breath sounds: Normal breath sounds.  Chest:     Breasts: Tanner Score is 5.        Right: Normal. No swelling, bleeding, inverted nipple, mass, nipple discharge or skin change.        Left: Normal. No swelling, bleeding, inverted nipple, mass, nipple discharge or skin change.     Comments: Pendulous Abdominal:     General: Bowel sounds are normal.     Palpations: Abdomen is soft.     Tenderness: There is no abdominal tenderness.     Comments: Obese, difficult to assess organomegaly. Large pannus. There is small wound, underneath pannus LLQ - with small amount of purulent drainage  Genitourinary:    Comments: deferred Musculoskeletal: Normal range of motion.  Lymphadenopathy:     Upper Body:     Right upper body: No supraclavicular or axillary adenopathy.     Left upper body: No supraclavicular or axillary adenopathy.  Skin:    General: Skin is warm and dry.  Neurological:     General: No focal deficit present.     Mental Status: She is alert and oriented to person, place, and time.  Psychiatric:        Mood and Affect: Mood normal.        Behavior: Behavior normal.         Assessment And Plan:     1. Routine general medical examination at health care facility  A full exam was performed.  Importance of monthly self breast exams was discussed with the  patient.  PATIENT HAS BEEN ADVISED TO GET 30-45 MINUTES REGULAR EXERCISE NO LESS THAN FOUR TO FIVE DAYS PER WEEK - BOTH WEIGHTBEARING EXERCISES AND AEROBIC ARE RECOMMENDED.  SHE WAS ADVISED TO FOLLOW A HEALTHY DIET WITH AT LEAST SIX FRUITS/VEGGIES PER DAY, DECREASE INTAKE OF RED MEAT, AND TO INCREASE FISH INTAKE TO TWO DAYS PER WEEK.  MEATS/FISH SHOULD NOT BE FRIED, BAKED  OR BROILED IS PREFERABLE.  I SUGGEST WEARING SPF 50 SUNSCREEN ON EXPOSED PARTS AND ESPECIALLY WHEN IN THE DIRECT SUNLIGHT FOR AN EXTENDED PERIOD OF TIME.  PLEASE AVOID FAST FOOD RESTAURANTS AND INCREASE YOUR WATER INTAKE.  - CMP14+EGFR - CBC - Lipid panel - Hemoglobin A1c - TSH - T4, Free - POCT Urinalysis Dipstick (81002) - POCT UA - Microalbumin  2. Parenchymal renal hypertension, stage 1 through stage 4 or unspecified chronic kidney disease  Fair control. She will continue with current meds for now. She is encouraged to avoid adding salt to her foods. EKG performed, no new changes noted. She will rto in six months for re-evaluation.   - EKG 12-Lead - POCT Urinalysis Dipstick (81002) - POCT UA - Microalbumin  3. Chronic renal disease, stage II  Chronic, yet stable. She is encouraged to stay well hydrated. I will check GFR, Cr today.    4. Peripheral vascular disease (HCC)  Chronic, yet stable. She is encouraged to increase activity as tolerated and to comply with statin use.   5. Estrogen deficiency  Last dexa from 2016 reviewed. I will refer her to [facility] for dexa scan. She has upcoming mammogram, will try to schedule for the same day.   - DG Bone Density; Future  6. Wound drainage  She was given rx flagyl, 563m - crush one tablet, make paste and apply to affected area twice daily x 7 days. She is encouraged to complete the full course. She denies prior allergy to this medication.   7. Need for vaccination  She plans to get flu vaccine at work. I will send in rx Prevnar-13 into her local pharmacy. She is  encouraged to let me know when she gets this injection.    RMaximino Greenland MD    THE PATIENT IS ENCOURAGED TO PRACTICE SOCIAL DISTANCING DUE TO THE COVID-19 PANDEMIC.

## 2018-10-28 DIAGNOSIS — Z23 Encounter for immunization: Secondary | ICD-10-CM | POA: Diagnosis not present

## 2018-10-28 LAB — CMP14+EGFR
ALT: 13 IU/L (ref 0–32)
AST: 19 IU/L (ref 0–40)
Albumin/Globulin Ratio: 1.4 (ref 1.2–2.2)
Albumin: 4.2 g/dL (ref 3.8–4.8)
Alkaline Phosphatase: 99 IU/L (ref 39–117)
BUN/Creatinine Ratio: 14 (ref 12–28)
BUN: 14 mg/dL (ref 8–27)
Bilirubin Total: 0.6 mg/dL (ref 0.0–1.2)
CO2: 17 mmol/L — ABNORMAL LOW (ref 20–29)
Calcium: 9.2 mg/dL (ref 8.7–10.3)
Chloride: 106 mmol/L (ref 96–106)
Creatinine, Ser: 0.98 mg/dL (ref 0.57–1.00)
GFR calc Af Amer: 70 mL/min/{1.73_m2} (ref >59)
GFR calc non Af Amer: 61 mL/min/{1.73_m2} (ref >59)
Globulin, Total: 3 g/dL (ref 1.5–4.5)
Glucose: 103 mg/dL — ABNORMAL HIGH (ref 65–99)
Potassium: 4 mmol/L (ref 3.5–5.2)
Sodium: 139 mmol/L (ref 134–144)
Total Protein: 7.2 g/dL (ref 6.0–8.5)

## 2018-10-28 LAB — LIPID PANEL
Chol/HDL Ratio: 2.6 ratio (ref 0.0–4.4)
Cholesterol, Total: 162 mg/dL (ref 100–199)
HDL: 63 mg/dL (ref >39)
LDL Chol Calc (NIH): 81 mg/dL (ref 0–99)
Triglycerides: 99 mg/dL (ref 0–149)
VLDL Cholesterol Cal: 18 mg/dL (ref 5–40)

## 2018-10-28 LAB — CBC
Hematocrit: 37.5 % (ref 34.0–46.6)
Hemoglobin: 12.8 g/dL (ref 11.1–15.9)
MCH: 28.3 pg (ref 26.6–33.0)
MCHC: 34.1 g/dL (ref 31.5–35.7)
MCV: 83 fL (ref 79–97)
Platelets: 282 10*3/uL (ref 150–450)
RBC: 4.52 x10E6/uL (ref 3.77–5.28)
RDW: 13.7 % (ref 11.7–15.4)
WBC: 8.3 10*3/uL (ref 3.4–10.8)

## 2018-10-28 LAB — T4, FREE: Free T4: 1.75 ng/dL (ref 0.82–1.77)

## 2018-10-28 LAB — TSH: TSH: 2.44 u[IU]/mL (ref 0.450–4.500)

## 2018-10-28 LAB — HEMOGLOBIN A1C
Est. average glucose Bld gHb Est-mCnc: 114 mg/dL
Hgb A1c MFr Bld: 5.6 % (ref 4.8–5.6)

## 2018-10-30 ENCOUNTER — Encounter: Payer: Self-pay | Admitting: Internal Medicine

## 2018-11-07 DIAGNOSIS — E2839 Other primary ovarian failure: Secondary | ICD-10-CM | POA: Diagnosis not present

## 2018-11-07 DIAGNOSIS — R29890 Loss of height: Secondary | ICD-10-CM | POA: Diagnosis not present

## 2018-11-07 DIAGNOSIS — Z780 Asymptomatic menopausal state: Secondary | ICD-10-CM | POA: Diagnosis not present

## 2018-11-07 DIAGNOSIS — Z1231 Encounter for screening mammogram for malignant neoplasm of breast: Secondary | ICD-10-CM | POA: Diagnosis not present

## 2018-11-07 LAB — HM DEXA SCAN: HM Dexa Scan: NORMAL

## 2018-11-07 LAB — HM MAMMOGRAPHY

## 2018-11-10 ENCOUNTER — Encounter: Payer: Self-pay | Admitting: Internal Medicine

## 2018-11-11 ENCOUNTER — Encounter: Payer: Self-pay | Admitting: Internal Medicine

## 2018-11-12 ENCOUNTER — Encounter: Payer: Self-pay | Admitting: Internal Medicine

## 2018-11-15 IMAGING — RF DG ABDOMEN 1V
1 series · 2 of 2 positions shown · non-contrast
Comparison: Abdominal CT from yesterday

CLINICAL DATA: Intraoperative fluoroscopy.  Sigmoid obstruction.

EXAM:
ABDOMEN - 1 VIEW

[Series 1: run · 2 of 2 slices shown]
[im 1/2]
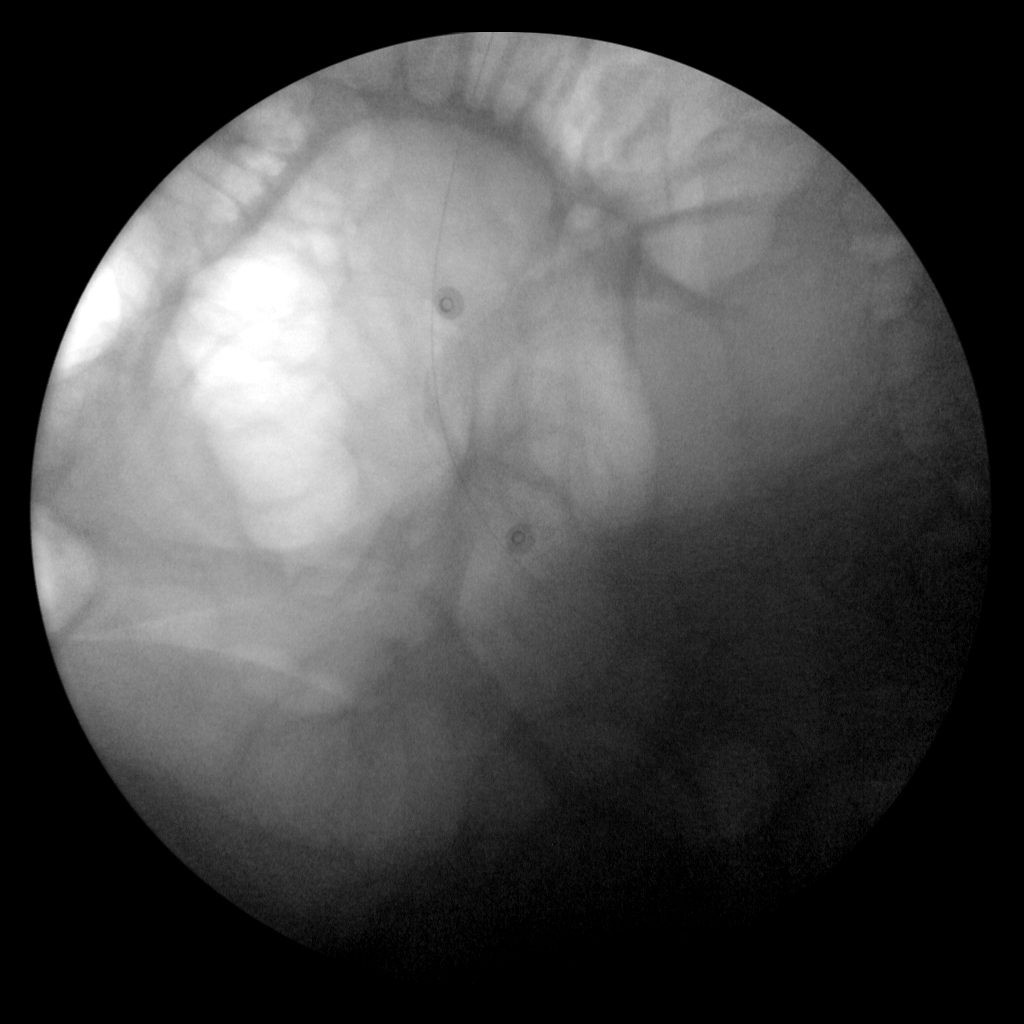
[im 2/2]
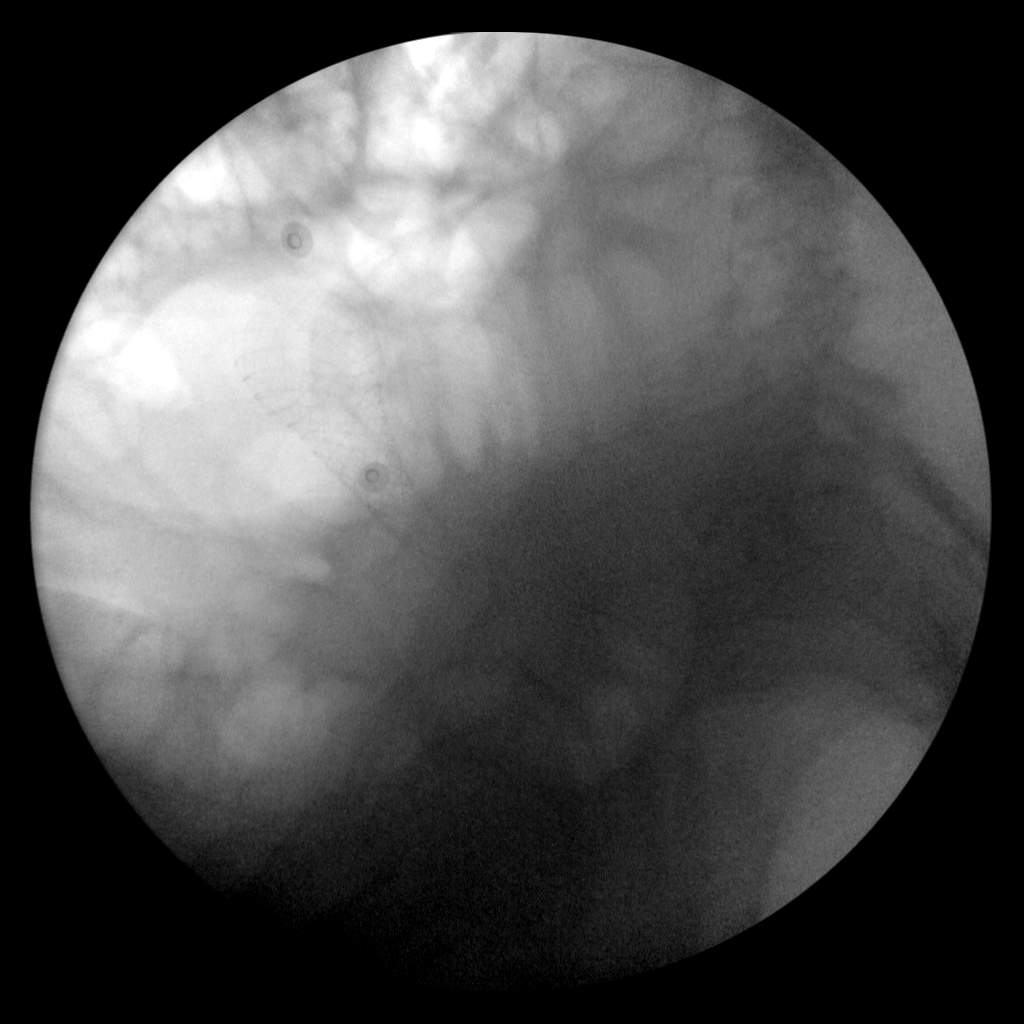

[2 of 2 positions shown; findings below may reference images not displayed]

FINDINGS: Fluoroscopy shows wire and then stent placement over the pelvis
correlating with history of sigmoid stenting. Diffuse gas dilated
colon.
IMPRESSION: Fluoroscopy for sigmoid stenting.

## 2018-12-04 ENCOUNTER — Encounter: Payer: Self-pay | Admitting: Internal Medicine

## 2019-01-19 ENCOUNTER — Other Ambulatory Visit: Payer: Self-pay | Admitting: Internal Medicine

## 2019-01-19 MED FILL — LEVOTHYROXINE 112 MCG TAB: 112 | 90 days supply | Qty: 90 | Fill #1

## 2019-01-20 DIAGNOSIS — H524 Presbyopia: Secondary | ICD-10-CM | POA: Diagnosis not present

## 2019-01-20 MED FILL — LISINOPRIL 20 MG TABLET: 20 | 90 days supply | Qty: 90 | Fill #0

## 2019-01-20 MED FILL — SIMVASTATIN 20 MG TABLET: 20 | 90 days supply | Qty: 90 | Fill #0

## 2019-04-27 ENCOUNTER — Ambulatory Visit: Payer: 59 | Admitting: Internal Medicine

## 2019-04-27 ENCOUNTER — Encounter: Payer: Self-pay | Admitting: Internal Medicine

## 2019-04-27 ENCOUNTER — Other Ambulatory Visit: Payer: Self-pay

## 2019-04-27 VITALS — BP 142/88 | HR 85 | Temp 98.4°F | Ht 64.0 in | Wt 242.6 lb

## 2019-04-27 DIAGNOSIS — E039 Hypothyroidism, unspecified: Secondary | ICD-10-CM

## 2019-04-27 DIAGNOSIS — I739 Peripheral vascular disease, unspecified: Secondary | ICD-10-CM

## 2019-04-27 DIAGNOSIS — M25551 Pain in right hip: Secondary | ICD-10-CM

## 2019-04-27 DIAGNOSIS — N182 Chronic kidney disease, stage 2 (mild): Secondary | ICD-10-CM | POA: Diagnosis not present

## 2019-04-27 DIAGNOSIS — Z6841 Body Mass Index (BMI) 40.0 and over, adult: Secondary | ICD-10-CM

## 2019-04-27 DIAGNOSIS — E559 Vitamin D deficiency, unspecified: Secondary | ICD-10-CM

## 2019-04-27 DIAGNOSIS — I129 Hypertensive chronic kidney disease with stage 1 through stage 4 chronic kidney disease, or unspecified chronic kidney disease: Secondary | ICD-10-CM | POA: Diagnosis not present

## 2019-04-27 NOTE — Patient Instructions (Signed)
Exercises To Do While Sitting  Exercises that you do while sitting (chair exercises) can give you many of the same benefits as full exercise. Benefits include strengthening your heart, burning calories, and keeping muscles and joints healthy. Exercise can also improve your mood and help with depression and anxiety. You may benefit from chair exercises if you are unable to do standing exercises because of:  Diabetic foot pain.  Obesity.  Illness.  Arthritis.  Recovery from surgery or injury.  Breathing problems.  Balance problems.  Another type of disability. Before starting chair exercises, check with your health care provider or a physical therapist to find out how much exercise you can tolerate and which exercises are safe for you. If your health care provider approves:  Start out slowly and build up over time. Aim to work up to about 10-20 minutes for each exercise session.  Make exercise part of your daily routine.  Drink water when you exercise. Do not wait until you are thirsty. Drink every 10-15 minutes.  Stop exercising right away if you have pain, nausea, shortness of breath, or dizziness.  If you are exercising in a wheelchair, make sure to lock the wheels.  Ask your health care provider whether you can do tai chi or yoga. Many positions in these mind-body exercises can be modified to do while seated. Warm-up Before starting other exercises: 1. Sit up as straight as you can. Have your knees bent at 90 degrees, which is the shape of the capital letter "L." Keep your feet flat on the floor. 2. Sit at the front edge of your chair, if you can. 3. Pull in (tighten) the muscles in your abdomen and stretch your spine and neck as straight as you can. Hold this position for a few minutes. 4. Breathe in and out evenly. Try to concentrate on your breathing, and relax your mind. Stretching Exercise A: Arm stretch 1. Hold your arms out straight in front of your body. 2. Bend  your hands at the wrist with your fingers pointing up, as if signaling someone to stop. Notice the slight tension in your forearms as you hold the position. 3. Keeping your arms out and your hands bent, rotate your hands outward as far as you can and hold this stretch. Aim to have your thumbs pointing up and your pinkie fingers pointing down. Slowly repeat arm stretches for one minute as tolerated. Exercise B: Leg stretch 1. If you can move your legs, try to "draw" letters on the floor with the toes of your foot. Write your name with one foot. 2. Write your name with the toes of your other foot. Slowly repeat the movements for one minute as tolerated. Exercise C: Reach for the sky 1. Reach your hands as far over your head as you can to stretch your spine. 2. Move your hands and arms as if you are climbing a rope. Slowly repeat the movements for one minute as tolerated. Range of motion exercises Exercise A: Shoulder roll 1. Let your arms hang loosely at your sides. 2. Lift just your shoulders up toward your ears, then let them relax back down. 3. When your shoulders feel loose, rotate your shoulders in backward and forward circles. Do shoulder rolls slowly for one minute as tolerated. Exercise B: March in place 1. As if you are marching, pump your arms and lift your legs up and down. Lift your knees as high as you can. ? If you are unable to lift your knees,  just pump your arms and move your ankles and feet up and down. March in place for one minute as tolerated. Exercise C: Seated jumping jacks 1. Let your arms hang down straight. 2. Keeping your arms straight, lift them up over your head. Aim to point your fingers to the ceiling. 3. While you lift your arms, straighten your legs and slide your heels along the floor to your sides, as wide as you can. 4. As you bring your arms back down to your sides, slide your legs back together. ? If you are unable to use your legs, just move your  arms. Slowly repeat seated jumping jacks for one minute as tolerated. Strengthening exercises Exercise A: Shoulder squeeze 1. Hold your arms straight out from your body to your sides, with your elbows bent and your fists pointed at the ceiling. 2. Keeping your arms in the bent position, move them forward so your elbows and forearms meet in front of your face. 3. Open your arms back out as wide as you can with your elbows still bent, until you feel your shoulder blades squeezing together. Hold for 5 seconds. Slowly repeat the movements forward and backward for one minute as tolerated. Contact a health care provider if you:  Had to stop exercising due to any of the following: ? Pain. ? Nausea. ? Shortness of breath. ? Dizziness. ? Fatigue.  Have significant pain or soreness after exercising. Get help right away if you have:  Chest pain.  Difficulty breathing. These symptoms may represent a serious problem that is an emergency. Do not wait to see if the symptoms will go away. Get medical help right away. Call your local emergency services (911 in the [place] Do not drive yourself to the hospital. This information is not intended to replace advice given to you by your health care provider. Make sure you discuss any questions you have with your health care provider. Document Revised: 05/22/2018 Document Reviewed: 12/12/2016 Elsevier Patient Education  2020 [employer].

## 2019-04-27 NOTE — Progress Notes (Signed)
This visit occurred during the SARS-CoV-2 public health emergency.  Safety protocols were in place, including screening questions prior to the visit, additional usage of staff PPE, and extensive cleaning of exam room while observing appropriate contact time as indicated for disinfecting solutions.  Subjective:     Patient ID: [name] , female    DOB: 02/03/1956 , 64 y.o.   MRN: [id number]   Chief Complaint  Patient presents with  . Hypertension  . Hypothyroidism    HPI  She presents today for blood pressure and thyroid check.  She reports compliance with meds. Ambulatory with cane today d/t hip pain. Denies fall/trauma.   Hypertension This is a chronic problem. The current episode started more than 1 year ago. The problem has been gradually improving since onset. The problem is controlled. Pertinent negatives include no blurred vision, chest pain, palpitations or shortness of breath.     Past Medical History:  Diagnosis Date  . Arthritis   . DOE (dyspnea on exertion)    mild  . Hyperlipidemia   . Hypertension   . Hypothyroidism   . Morbid obesity ([place])   . Pre-diabetes   . Schamberg disease      Family History  Problem Relation Age of Onset  . Hypertension Mother   . Heart Problems Mother        Pacemaker  . Heart attack Father        at age 38.  Marland Kitchen Heart attack Maternal Grandfather 96       Died of AAA  . AAA (abdominal aortic aneurysm) Maternal Grandfather   . Heart attack Maternal Uncle   . Stroke Paternal Aunt   . Alzheimer's disease Maternal Grandmother      Current Outpatient Medications:  .  Calcium Carbonate-Vitamin D (CALCIUM 600+D) 600-400 MG-UNIT tablet, Take 1 tablet by mouth daily., Disp: , Rfl:  .  Cholecalciferol (VITAMIN D) 2000 units tablet, Take 4,000 Units by mouth daily., Disp: , Rfl:  .  levothyroxine (SYNTHROID) 112 MCG tablet, TAKE 1 TABLET BY MOUTH DAILY, Disp: 90 tablet, Rfl: 1 .  lisinopril (ZESTRIL) 20 MG tablet, TAKE 1 TABLET  BY MOUTH ONCE DAILY, Disp: 90 tablet, Rfl: 0 .  potassium chloride SA (K-DUR,KLOR-CON) 20 MEQ tablet, Take 20 mEq by mouth daily as needed (when taking Torsemide). , Disp: , Rfl:  .  simvastatin (ZOCOR) 20 MG tablet, TAKE 1 TABLET BY MOUTH DAILY., Disp: 90 tablet, Rfl: 0 .  torsemide (DEMADEX) 20 MG tablet, Take 20 mg by mouth daily as needed (for edema). , Disp: , Rfl:    No Known Allergies   Review of Systems  Constitutional: Negative.   Eyes: Negative for blurred vision.  Respiratory: Negative.  Negative for shortness of breath.   Cardiovascular: Negative.  Negative for chest pain and palpitations.  Gastrointestinal: Negative.   Musculoskeletal: Positive for arthralgias.       She c/o r hip pain. There is pain with ambulation. She is ambulatory with cane today. It is relieved with Tylenol.   Neurological: Negative.   Psychiatric/Behavioral: Negative.      Today's Vitals   04/27/19 0842  BP: (!) 142/88  Pulse: 85  Temp: 98.4 F (36.9 C)  TempSrc: Oral  Weight: 242 lb 9.6 oz (110 kg)  Height: 5' 4"  (1.626 m)  PainSc: 0-No pain   Body mass index is 41.64 kg/m.   Objective:  Physical Exam Vitals and nursing note reviewed.  Constitutional:      Appearance: Normal  appearance.  HENT:     Head: Normocephalic and atraumatic.  Cardiovascular:     Rate and Rhythm: Normal rate and regular rhythm.     Heart sounds: Normal heart sounds.  Pulmonary:     Effort: Pulmonary effort is normal.     Breath sounds: Normal breath sounds.  Musculoskeletal:     Comments: R lateral hip tender to palpation  Skin:    General: Skin is warm.  Neurological:     General: No focal deficit present.     Mental Status: She is alert.  Psychiatric:        Mood and Affect: Mood normal.        Behavior: Behavior normal.         Assessment And Plan:     1. Parenchymal renal hypertension, stage 1 through stage 4 or unspecified chronic kidney disease  Fair control. She will continue with  current meds for now. She is encouraged to avoid adding salt to her foods. I will check renal function today. She will rto in six months for her next physical examination.   - BMP8+EGFR  2. Chronic renal disease, stage II  Chronic, I will check renal function today.   3. Primary hypothyroidism  I will check thyroid panel and adjust meds as needed.  - TSH  4. Peripheral vascular disease (HCC)  Chronic, importance of regular exercise was discussed with the patient. She is encouraged to take statin as prescribed.   5. Right hip pain  She will continue with Tylenol prn for now. Pt advised she may have bursitis. She is not yet ready to see Ortho. She will let me know if her sx persist/worsen.   6. Class 3 severe obesity due to excess calories with serious comorbidity and body mass index (BMI) of 40.0 to 44.9 in adult (HCC)  BMI 41.  She is encouraged to strive for BMI less than 35 to decrease cardiac risk. She was given some chair exercises to perform while watching TV.   7. Vitamin D deficiency disease  I WILL CHECK A VIT D LEVEL AND SUPPLEMENT AS NEEDED.  ALSO ENCOURAGED TO SPEND 15 MINUTES IN THE SUN DAILY.  - Vitamin D (25 hydroxy)    [name], MD    THE PATIENT IS ENCOURAGED TO PRACTICE SOCIAL DISTANCING DUE TO THE COVID-19 PANDEMIC.

## 2019-04-28 LAB — BMP8+EGFR
BUN/Creatinine Ratio: 18 (ref 12–28)
BUN: 18 mg/dL (ref 8–27)
CO2: 22 mmol/L (ref 20–29)
Calcium: 9.7 mg/dL (ref 8.7–10.3)
Chloride: 105 mmol/L (ref 96–106)
Creatinine, Ser: 1.02 mg/dL — ABNORMAL HIGH (ref 0.57–1.00)
GFR calc Af Amer: 67 mL/min/{1.73_m2} (ref >59)
GFR calc non Af Amer: 58 mL/min/{1.73_m2} — ABNORMAL LOW (ref >59)
Glucose: 101 mg/dL — ABNORMAL HIGH (ref 65–99)
Potassium: 4.7 mmol/L (ref 3.5–5.2)
Sodium: 141 mmol/L (ref 134–144)

## 2019-04-28 LAB — TSH: TSH: 3.35 u[IU]/mL (ref 0.450–4.500)

## 2019-04-28 LAB — VITAMIN D 25 HYDROXY (VIT D DEFICIENCY, FRACTURES): Vit D, 25-Hydroxy: 32.1 ng/mL (ref 30.0–100.0)

## 2019-07-06 ENCOUNTER — Other Ambulatory Visit: Payer: Self-pay | Admitting: Internal Medicine

## 2019-07-06 MED FILL — LEVOTHYROXINE SODIUM 112 MC: 112 | 90 days supply | Qty: 90 | Fill #0

## 2019-07-06 MED FILL — LISINOPRIL 20 MG TABLET: 20 | 90 days supply | Qty: 90 | Fill #0

## 2019-07-06 MED FILL — SIMVASTATIN 20 MG TABLET: 20 | 90 days supply | Qty: 90 | Fill #0

## 2019-07-27 ENCOUNTER — Encounter: Payer: Self-pay | Admitting: Internal Medicine

## 2019-07-28 ENCOUNTER — Ambulatory Visit: Payer: 59 | Admitting: Internal Medicine

## 2019-07-28 ENCOUNTER — Encounter: Payer: Self-pay | Admitting: Internal Medicine

## 2019-07-28 ENCOUNTER — Other Ambulatory Visit: Payer: Self-pay

## 2019-07-28 VITALS — BP 162/84 | HR 93 | Temp 97.5°F | Ht 64.0 in | Wt 240.8 lb

## 2019-07-28 DIAGNOSIS — I129 Hypertensive chronic kidney disease with stage 1 through stage 4 chronic kidney disease, or unspecified chronic kidney disease: Secondary | ICD-10-CM

## 2019-07-28 DIAGNOSIS — N182 Chronic kidney disease, stage 2 (mild): Secondary | ICD-10-CM

## 2019-07-28 DIAGNOSIS — Z6841 Body Mass Index (BMI) 40.0 and over, adult: Secondary | ICD-10-CM | POA: Diagnosis not present

## 2019-07-28 MED ORDER — AMLODIPINE BESYLATE 5 MG PO TABS: 5.0000 mg | ORAL_TABLET | Freq: Every day | ORAL | 1 refills | Status: DC

## 2019-07-28 MED FILL — AMLODIPINE BESYLATE 5 MG TA: 5 | 30 days supply | Qty: 30 | Fill #0

## 2019-07-28 NOTE — Progress Notes (Signed)
This visit occurred during the SARS-CoV-2 public health emergency.  Safety protocols were in place, including screening questions prior to the visit, additional usage of staff PPE, and extensive cleaning of exam room while observing appropriate contact time as indicated for disinfecting solutions.  Subjective:     Patient ID: [name] , female    DOB: [birth year] , 64 y.o.   MRN: [id number]   Chief Complaint  Patient presents with  . Hypertension    HPI  She presents today for BP check. She reports compliance with meds. She feels well - no cp, sob, headaches. Admits to rushing to appt today.   Hypertension This is a chronic problem. The current episode started more than 1 year ago. The problem has been gradually improving since onset. The problem is controlled. Pertinent negatives include no blurred vision, chest pain, palpitations or shortness of breath. Risk factors for coronary artery disease include obesity, sedentary lifestyle and post-menopausal state. Compliance problems include exercise.      Past Medical History:  Diagnosis Date  . Arthritis   . DOE (dyspnea on exertion)    mild  . Hyperlipidemia   . Hypertension   . Hypothyroidism   . Morbid obesity ([place])   . Pre-diabetes   . Schamberg disease      Family History  Problem Relation Age of Onset  . Hypertension Mother   . Heart Problems Mother        Pacemaker  . Heart attack Father        at age 33.  Marland Kitchen Heart attack Maternal Grandfather 61       Died of AAA  . AAA (abdominal aortic aneurysm) Maternal Grandfather   . Heart attack Maternal Uncle   . Stroke Paternal Aunt   . Alzheimer's disease Maternal Grandmother      Current Outpatient Medications:  .  Calcium Carbonate-Vitamin D (CALCIUM 600+D) 600-400 MG-UNIT tablet, Take 1 tablet by mouth daily., Disp: , Rfl:  .  Cholecalciferol (VITAMIN D) 2000 units tablet, Take 4,000 Units by mouth daily., Disp: , Rfl:  .  levothyroxine (SYNTHROID) 112 MCG  tablet, TAKE 1 TABLET BY MOUTH DAILY, Disp: 90 tablet, Rfl: 1 .  lisinopril (ZESTRIL) 20 MG tablet, TAKE 1 TABLET BY MOUTH ONCE DAILY, Disp: 90 tablet, Rfl: 0 .  potassium chloride SA (K-DUR,KLOR-CON) 20 MEQ tablet, Take 20 mEq by mouth daily as needed (when taking Torsemide). , Disp: , Rfl:  .  simvastatin (ZOCOR) 20 MG tablet, TAKE 1 TABLET BY MOUTH DAILY., Disp: 90 tablet, Rfl: 0 .  torsemide (DEMADEX) 20 MG tablet, Take 20 mg by mouth daily as needed (for edema). , Disp: , Rfl:    No Known Allergies   Review of Systems  Constitutional: Negative.   Eyes: Negative for blurred vision.  Respiratory: Negative.  Negative for shortness of breath.   Cardiovascular: Negative.  Negative for chest pain and palpitations.  Gastrointestinal: Negative.   Neurological: Negative.   Psychiatric/Behavioral: Negative.      Today's Vitals   07/28/19 1537  BP: (!) 162/84  Pulse: 93  Temp: (!) 97.5 F (36.4 C)  TempSrc: Oral  Weight: 240 lb 12.8 oz (109.2 kg)  Height: 5\' 4"  (1.626 m)  PainSc: 0-No pain   Body mass index is 41.33 kg/m.   BP Readings from Last 3 Encounters:  07/28/19 (!) 162/84  04/27/19 (!) 142/88  10/27/18 140/80    Objective:  Physical Exam Vitals and nursing note reviewed.  Constitutional:  Appearance: Normal appearance. She is obese.  HENT:     Head: Normocephalic and atraumatic.  Cardiovascular:     Rate and Rhythm: Normal rate and regular rhythm.     Heart sounds: Normal heart sounds.  Pulmonary:     Effort: Pulmonary effort is normal.     Breath sounds: Normal breath sounds.  Skin:    General: Skin is warm.  Neurological:     General: No focal deficit present.     Mental Status: She is alert.  Psychiatric:        Mood and Affect: Mood normal.        Behavior: Behavior normal.         Assessment And Plan:     1. Parenchymal renal hypertension, stage 1 through stage 4 or unspecified chronic kidney disease  Uncontrolled. Repeat BP 164/92.  She  has recently filled rx for lisinopril. Therefore, I will add amlodipine 5mg  nightly to her current regimen. She agrees to rto in four to six weeks for re-evaluation. If still not at goal, at that time, I will d/c lisinopril and start her on an ARB.   2. Chronic renal disease, stage II  Chronic, this has been stable. Encouraged to stay well hydrated and to follow low-sodium diet.   3. Class 3 severe obesity due to excess calories with serious comorbidity and body mass index (BMI) of 40.0 to 44.9 in adult (HCC)  BMI 41. She is encouraged to strive for BMI less than 35 to decrease cardiac risk. She is advised to perform chair exercises while watching TV.   [name], MD    THE PATIENT IS ENCOURAGED TO PRACTICE SOCIAL DISTANCING DUE TO THE COVID-19 PANDEMIC.

## 2019-07-28 NOTE — Patient Instructions (Signed)
DASH Eating Plan DASH stands for "Dietary Approaches to Stop Hypertension." The DASH eating plan is a healthy eating plan that has been shown to reduce high blood pressure (hypertension). It may also reduce your risk for type 2 diabetes, heart disease, and stroke. The DASH eating plan may also help with weight loss. What are tips for following this plan?  General guidelines  Avoid eating more than 2,300 mg (milligrams) of salt (sodium) a day. If you have hypertension, you may need to reduce your sodium intake to 1,500 mg a day.  Limit alcohol intake to no more than 1 drink a day for nonpregnant women and 2 drinks a day for men. One drink equals 12 oz of beer, 5 oz of wine, or 1 oz of hard liquor.  Work with your health care provider to maintain a healthy body weight or to lose weight. Ask what an ideal weight is for you.  Get at least 30 minutes of exercise that causes your heart to beat faster (aerobic exercise) most days of the week. Activities may include walking, swimming, or biking.  Work with your health care provider or diet and nutrition specialist (dietitian) to adjust your eating plan to your individual calorie needs. Reading food labels   Check food labels for the amount of sodium per serving. Choose foods with less than 5 percent of the Daily Value of sodium. Generally, foods with less than 300 mg of sodium per serving fit into this eating plan.  To find whole grains, look for the word "whole" as the first word in the ingredient list. Shopping  Buy products labeled as "low-sodium" or "no salt added."  Buy fresh foods. Avoid canned foods and premade or frozen meals. Cooking  Avoid adding salt when cooking. Use salt-free seasonings or herbs instead of table salt or sea salt. Check with your health care provider or pharmacist before using salt substitutes.  Do not fry foods. Cook foods using healthy methods such as baking, boiling, grilling, and broiling instead.  Cook with  heart-healthy oils, such as olive, canola, soybean, or sunflower oil. Meal planning  Eat a balanced diet that includes: ? 5 or more servings of fruits and vegetables each day. At each meal, try to fill half of your plate with fruits and vegetables. ? Up to 6-8 servings of whole grains each day. ? Less than 6 oz of lean meat, poultry, or fish each day. A 3-oz serving of meat is about the same size as a deck of cards. One egg equals 1 oz. ? 2 servings of low-fat dairy each day. ? A serving of nuts, seeds, or beans 5 times each week. ? Heart-healthy fats. Healthy fats called Omega-3 fatty acids are found in foods such as flaxseeds and coldwater fish, like sardines, salmon, and mackerel.  Limit how much you eat of the following: ? Canned or prepackaged foods. ? Food that is high in trans fat, such as fried foods. ? Food that is high in saturated fat, such as fatty meat. ? Sweets, desserts, sugary drinks, and other foods with added sugar. ? Full-fat dairy products.  Do not salt foods before eating.  Try to eat at least 2 vegetarian meals each week.  Eat more home-cooked food and less restaurant, buffet, and fast food.  When eating at a restaurant, ask that your food be prepared with less salt or no salt, if possible. What foods are recommended? The items listed may not be a complete list. Talk with your dietitian about   what dietary choices are best for you. Grains Whole-grain or whole-wheat bread. Whole-grain or whole-wheat pasta. Brown rice. Oatmeal. Quinoa. Bulgur. Whole-grain and low-sodium cereals. Pita bread. Low-fat, low-sodium crackers. Whole-wheat flour tortillas. Vegetables Fresh or frozen vegetables (raw, steamed, roasted, or grilled). Low-sodium or reduced-sodium tomato and vegetable juice. Low-sodium or reduced-sodium tomato sauce and tomato paste. Low-sodium or reduced-sodium canned vegetables. Fruits All fresh, dried, or frozen fruit. Canned fruit in natural juice (without  added sugar). Meat and other protein foods Skinless chicken or turkey. Ground chicken or turkey. Pork with fat trimmed off. Fish and seafood. Egg whites. Dried beans, peas, or lentils. Unsalted nuts, nut butters, and seeds. Unsalted canned beans. Lean cuts of beef with fat trimmed off. Low-sodium, lean deli meat. Dairy Low-fat (1%) or fat-free (skim) milk. Fat-free, low-fat, or reduced-fat cheeses. Nonfat, low-sodium ricotta or cottage cheese. Low-fat or nonfat yogurt. Low-fat, low-sodium cheese. Fats and oils Soft margarine without trans fats. Vegetable oil. Low-fat, reduced-fat, or light mayonnaise and salad dressings (reduced-sodium). Canola, safflower, olive, soybean, and sunflower oils. Avocado. Seasoning and other foods Herbs. Spices. Seasoning mixes without salt. Unsalted popcorn and pretzels. Fat-free sweets. What foods are not recommended? The items listed may not be a complete list. Talk with your dietitian about what dietary choices are best for you. Grains Baked goods made with fat, such as croissants, muffins, or some breads. Dry pasta or rice meal packs. Vegetables Creamed or fried vegetables. Vegetables in a cheese sauce. Regular canned vegetables (not low-sodium or reduced-sodium). Regular canned tomato sauce and paste (not low-sodium or reduced-sodium). Regular tomato and vegetable juice (not low-sodium or reduced-sodium). Pickles. Olives. Fruits Canned fruit in a light or heavy syrup. Fried fruit. Fruit in cream or butter sauce. Meat and other protein foods Fatty cuts of meat. Ribs. Fried meat. Bacon. Sausage. Bologna and other processed lunch meats. Salami. Fatback. Hotdogs. Bratwurst. Salted nuts and seeds. Canned beans with added salt. Canned or smoked fish. Whole eggs or egg yolks. Chicken or turkey with skin. Dairy Whole or 2% milk, cream, and half-and-half. Whole or full-fat cream cheese. Whole-fat or sweetened yogurt. Full-fat cheese. Nondairy creamers. Whipped toppings.  Processed cheese and cheese spreads. Fats and oils Butter. Stick margarine. Lard. Shortening. Ghee. Bacon fat. Tropical oils, such as coconut, palm kernel, or palm oil. Seasoning and other foods Salted popcorn and pretzels. Onion salt, garlic salt, seasoned salt, table salt, and sea salt. Worcestershire sauce. Tartar sauce. Barbecue sauce. Teriyaki sauce. Soy sauce, including reduced-sodium. Steak sauce. Canned and packaged gravies. Fish sauce. Oyster sauce. Cocktail sauce. Horseradish that you find on the shelf. Ketchup. Mustard. Meat flavorings and tenderizers. Bouillon cubes. Hot sauce and Tabasco sauce. Premade or packaged marinades. Premade or packaged taco seasonings. Relishes. Regular salad dressings. Where to find more information:  National Heart, Lung, and Blood Institute: www.nhlbi.nih.gov  American Heart Association: www.heart.org Summary  The DASH eating plan is a healthy eating plan that has been shown to reduce high blood pressure (hypertension). It may also reduce your risk for type 2 diabetes, heart disease, and stroke.  With the DASH eating plan, you should limit salt (sodium) intake to 2,300 mg a day. If you have hypertension, you may need to reduce your sodium intake to 1,500 mg a day.  When on the DASH eating plan, aim to eat more fresh fruits and vegetables, whole grains, lean proteins, low-fat dairy, and heart-healthy fats.  Work with your health care provider or diet and nutrition specialist (dietitian) to adjust your eating plan to your   individual calorie needs. This information is not intended to replace advice given to you by your health care provider. Make sure you discuss any questions you have with your health care provider. Document Revised: 01/11/2017 Document Reviewed: 01/23/2016 Elsevier Patient Education  2020 [employer]  

## 2019-08-04 ENCOUNTER — Encounter: Payer: Self-pay | Admitting: Internal Medicine

## 2019-09-15 ENCOUNTER — Encounter: Payer: Self-pay | Admitting: Internal Medicine

## 2019-09-15 ENCOUNTER — Ambulatory Visit: Payer: 59 | Admitting: Internal Medicine

## 2019-09-15 ENCOUNTER — Other Ambulatory Visit: Payer: Self-pay

## 2019-09-15 ENCOUNTER — Other Ambulatory Visit: Payer: Self-pay | Admitting: Internal Medicine

## 2019-09-15 VITALS — BP 130/62 | HR 82 | Temp 97.6°F | Ht 64.0 in | Wt 235.0 lb

## 2019-09-15 DIAGNOSIS — I129 Hypertensive chronic kidney disease with stage 1 through stage 4 chronic kidney disease, or unspecified chronic kidney disease: Secondary | ICD-10-CM | POA: Diagnosis not present

## 2019-09-15 DIAGNOSIS — E66813 Obesity, class 3: Secondary | ICD-10-CM

## 2019-09-15 DIAGNOSIS — N182 Chronic kidney disease, stage 2 (mild): Secondary | ICD-10-CM

## 2019-09-15 DIAGNOSIS — Z6841 Body Mass Index (BMI) 40.0 and over, adult: Secondary | ICD-10-CM | POA: Diagnosis not present

## 2019-09-15 MED ORDER — AMLODIPINE BESYLATE 5 MG PO TABS: 5.0000 mg | ORAL_TABLET | Freq: Every day | ORAL | 2 refills | Status: DC

## 2019-09-15 MED FILL — AMLODIPINE BESYLATE 5 MG TA: 5 | 90 days supply | Qty: 90 | Fill #0

## 2019-09-15 NOTE — Patient Instructions (Signed)

## 2019-09-15 NOTE — Progress Notes (Signed)
This visit occurred during the SARS-CoV-2 public health emergency.  Safety protocols were in place, including screening questions prior to the visit, additional usage of staff PPE, and extensive cleaning of exam room while observing appropriate contact time as indicated for disinfecting solutions.  Subjective:     Patient ID: [name] , female    DOB: 1955-05-04 , 64 y.o.   MRN: [id number]   Chief Complaint  Patient presents with   Hypertension    follow up medication change    HPI  She presents today for BP check. She reports compliance with meds. She was started on amlodipine 5mg  at her last visit. Initially, she felt she had side effects, but they have since resolved. She has no new concerns.   Hypertension This is a chronic problem. The current episode started more than 1 year ago. The problem has been gradually improving since onset. The problem is controlled. Pertinent negatives include no blurred vision, chest pain, palpitations or shortness of breath. Risk factors for coronary artery disease include obesity, sedentary lifestyle and post-menopausal state. Compliance problems include exercise.      Past Medical History:  Diagnosis Date   Arthritis    DOE (dyspnea on exertion)    mild   Hyperlipidemia    Hypertension    Hypothyroidism    Morbid obesity ([place])    Pre-diabetes    Schamberg disease      Family History  Problem Relation Age of Onset   Hypertension Mother    Heart Problems Mother        Pacemaker   Heart attack Father        at age 66.   Heart attack Maternal Grandfather 68       Died of AAA   AAA (abdominal aortic aneurysm) Maternal Grandfather    Heart attack Maternal Uncle    Stroke Paternal Aunt    Alzheimer's disease Maternal Grandmother      Current Outpatient Medications:    amLODipine (NORVASC) 5 MG tablet, Take 1 tablet (5 mg total) by mouth daily., Disp: 90 tablet, Rfl: 2   Calcium Carbonate-Vitamin D (CALCIUM  600+D) 600-400 MG-UNIT tablet, Take 1 tablet by mouth daily., Disp: , Rfl:    Cholecalciferol (VITAMIN D) 2000 units tablet, Take 4,000 Units by mouth daily., Disp: , Rfl:    levothyroxine (SYNTHROID) 112 MCG tablet, TAKE 1 TABLET BY MOUTH DAILY, Disp: 90 tablet, Rfl: 1   lisinopril (ZESTRIL) 20 MG tablet, TAKE 1 TABLET BY MOUTH ONCE DAILY, Disp: 90 tablet, Rfl: 0   potassium chloride SA (K-DUR,KLOR-CON) 20 MEQ tablet, Take 20 mEq by mouth daily as needed (when taking Torsemide). , Disp: , Rfl:    simvastatin (ZOCOR) 20 MG tablet, TAKE 1 TABLET BY MOUTH DAILY., Disp: 90 tablet, Rfl: 0   torsemide (DEMADEX) 20 MG tablet, Take 20 mg by mouth daily as needed (for edema). , Disp: , Rfl:    No Known Allergies   Review of Systems  Constitutional: Negative.   Eyes: Negative for blurred vision.  Respiratory: Negative.  Negative for shortness of breath.   Cardiovascular: Negative.  Negative for chest pain and palpitations.  Gastrointestinal: Negative.   Neurological: Negative.   Psychiatric/Behavioral: Negative.      Today's Vitals   09/15/19 1138  BP: 130/62  Pulse: 82  Temp: 97.6 F (36.4 C)  TempSrc: Oral  Weight: 235 lb (106.6 kg)  Height: 5\' 4"  (1.626 m)   Body mass index is 40.34 kg/m.   Objective:  Physical Exam Vitals and nursing note reviewed.  Constitutional:      Appearance: Normal appearance. She is obese.  HENT:     Head: Normocephalic and atraumatic.  Cardiovascular:     Rate and Rhythm: Normal rate and regular rhythm.     Heart sounds: Normal heart sounds.  Pulmonary:     Effort: Pulmonary effort is normal.     Breath sounds: Normal breath sounds.  Skin:    General: Skin is warm.  Neurological:     General: No focal deficit present.     Mental Status: She is alert.  Psychiatric:        Mood and Affect: Mood normal.        Behavior: Behavior normal.         Assessment And Plan:     1. Parenchymal renal hypertension, stage 1 through stage 4 or  unspecified chronic kidney disease Comments: Chronic, improved control. She will continue with current meds. She is encouraged to avoid adding salt to her foods.   2. Chronic renal disease, stage II Comments: Chronic, this has been stable. Encouraged to stay well hydrated.   3. Class 3 severe obesity due to excess calories with serious comorbidity and body mass index (BMI) of 40.0 to 44.9 in adult (HCC)   BMI 40. She is encouraged to strive for BMI less than 35 to decrease cardiac risk. Advised to aim for at least 150 minutes of exercise per week.  Patient was given opportunity to ask questions. Patient verbalized understanding of the plan and was able to repeat key elements of the plan. All questions were answered to their satisfaction.  [name], MD   I, [name], MD, have reviewed all documentation for this visit. The documentation on 09/25/19 for the exam, diagnosis, procedures, and orders are all accurate and complete.  THE PATIENT IS ENCOURAGED TO PRACTICE SOCIAL DISTANCING DUE TO THE COVID-19 PANDEMIC.

## 2019-10-12 ENCOUNTER — Encounter: Payer: Self-pay | Admitting: Internal Medicine

## 2019-10-15 ENCOUNTER — Other Ambulatory Visit: Payer: Self-pay | Admitting: Internal Medicine

## 2019-10-15 MED ORDER — SIMVASTATIN 20 MG PO TABS
20.0000 mg | ORAL_TABLET | Freq: Every day | ORAL | 0 refills | Status: DC
Start: 2019-10-15 — End: 2020-04-25

## 2019-10-15 MED ORDER — LISINOPRIL 20 MG PO TABS
20.0000 mg | ORAL_TABLET | Freq: Every day | ORAL | 0 refills | Status: DC
Start: 2019-10-15 — End: 2020-04-11

## 2019-10-15 MED FILL — LISINOPRIL 20 MG TABLET: 20 | 90 days supply | Qty: 90 | Fill #0

## 2019-10-15 MED FILL — LEVOTHYROXINE SODIUM 112 MC: 112 | 90 days supply | Qty: 90 | Fill #1

## 2019-10-15 MED FILL — SIMVASTATIN 20 MG TABLET: 20 | 90 days supply | Qty: 90 | Fill #0

## 2019-11-03 ENCOUNTER — Encounter: Payer: Self-pay | Admitting: Internal Medicine

## 2019-11-03 ENCOUNTER — Other Ambulatory Visit: Payer: Self-pay

## 2019-11-03 ENCOUNTER — Ambulatory Visit: Payer: 59 | Admitting: Internal Medicine

## 2019-11-03 VITALS — BP 120/78 | HR 87 | Temp 97.6°F | Ht 62.0 in | Wt 231.0 lb

## 2019-11-03 DIAGNOSIS — E559 Vitamin D deficiency, unspecified: Secondary | ICD-10-CM

## 2019-11-03 DIAGNOSIS — Z0000 Encounter for general adult medical examination without abnormal findings: Secondary | ICD-10-CM

## 2019-11-03 DIAGNOSIS — Z23 Encounter for immunization: Secondary | ICD-10-CM | POA: Diagnosis not present

## 2019-11-03 DIAGNOSIS — N182 Chronic kidney disease, stage 2 (mild): Secondary | ICD-10-CM

## 2019-11-03 DIAGNOSIS — Z6841 Body Mass Index (BMI) 40.0 and over, adult: Secondary | ICD-10-CM

## 2019-11-03 DIAGNOSIS — E039 Hypothyroidism, unspecified: Secondary | ICD-10-CM

## 2019-11-03 DIAGNOSIS — I129 Hypertensive chronic kidney disease with stage 1 through stage 4 chronic kidney disease, or unspecified chronic kidney disease: Secondary | ICD-10-CM

## 2019-11-03 LAB — POCT URINALYSIS DIPSTICK
Bilirubin, UA: NEGATIVE
Glucose, UA: NEGATIVE
Ketones, UA: NEGATIVE
Nitrite, UA: NEGATIVE
Protein, UA: NEGATIVE
Spec Grav, UA: 1.025 (ref 1.010–1.025)
Urobilinogen, UA: 0.2 E.U./dL
pH, UA: 6 (ref 5.0–8.0)

## 2019-11-03 LAB — POCT UA - MICROALBUMIN
Albumin/Creatinine Ratio, Urine, POC: 30
Creatinine, POC: 200 mg/dL
Microalbumin Ur, POC: 10 mg/L

## 2019-11-03 NOTE — Patient Instructions (Signed)

## 2019-11-03 NOTE — Progress Notes (Addendum)
I,[name],acting as a Education administrator for Erin Greenland, MD.,have documented all relevant documentation on the behalf of Erin Greenland, MD,as directed by  Erin Greenland, MD while in the presence of Erin Greenland, MD.  This visit occurred during the SARS-CoV-2 public health emergency.  Safety protocols were in place, including screening questions prior to the visit, additional usage of staff PPE, and extensive cleaning of exam room while observing appropriate contact time as indicated for disinfecting solutions.  Subjective:     Patient ID: [name] , female    DOB: 1955-05-01 , 64 y.o.   MRN: [id number]   Chief Complaint  Patient presents with  . Annual Exam  . Hypertension    HPI  She is here today for a full physical examination.  She is not followed by GYN. Her last pap smear was in 2018. She has no specific concerns or complaints at this time. She reports compliance with meds.   Hypertension This is a chronic problem. The current episode started more than 1 year ago. The problem has been gradually improving since onset. The problem is uncontrolled. Pertinent negatives include no blurred vision, chest pain, palpitations or shortness of breath. Risk factors for coronary artery disease include obesity, post-menopausal state and sedentary lifestyle. Past treatments include diuretics and ACE inhibitors. The current treatment provides moderate improvement. Compliance problems include exercise.      Past Medical History:  Diagnosis Date  . Arthritis   . DOE (dyspnea on exertion)    mild  . Hyperlipidemia   . Hypertension   . Hypothyroidism   . Morbid obesity ([place])   . Pre-diabetes   . Schamberg disease      Family History  Problem Relation Age of Onset  . Hypertension Mother   . Heart Problems Mother        Pacemaker  . Heart attack Father        at age 66.  Marland Kitchen Heart attack Maternal Grandfather 68       Died of AAA  . AAA (abdominal aortic aneurysm) Maternal  Grandfather   . Heart attack Maternal Uncle   . Stroke Paternal Aunt   . Alzheimer's disease Maternal Grandmother      Current Outpatient Medications:  .  amLODipine (NORVASC) 5 MG tablet, Take 1 tablet (5 mg total) by mouth daily., Disp: 90 tablet, Rfl: 2 .  Calcium Carbonate-Vitamin D (CALCIUM 600+D) 600-400 MG-UNIT tablet, Take 1 tablet by mouth daily., Disp: , Rfl:  .  Cholecalciferol (VITAMIN D) 2000 units tablet, Take 4,000 Units by mouth daily., Disp: , Rfl:  .  levothyroxine (SYNTHROID) 112 MCG tablet, TAKE 1 TABLET BY MOUTH DAILY, Disp: 90 tablet, Rfl: 1 .  lisinopril (ZESTRIL) 20 MG tablet, Take 1 tablet (20 mg total) by mouth daily., Disp: 90 tablet, Rfl: 0 .  Multiple Vitamins-Minerals (CENTRUM SILVER 50+WOMEN PO), Take by mouth. 1 per day, Disp: , Rfl:  .  potassium chloride SA (K-DUR,KLOR-CON) 20 MEQ tablet, Take 20 mEq by mouth daily as needed (when taking Torsemide). , Disp: , Rfl:  .  simvastatin (ZOCOR) 20 MG tablet, Take 1 tablet (20 mg total) by mouth daily., Disp: 90 tablet, Rfl: 0 .  torsemide (DEMADEX) 20 MG tablet, Take 20 mg by mouth daily as needed (for edema). , Disp: , Rfl:  .  Zoster Vaccine Adjuvanted [facility]) injection, Inject 0.5 mLs into the muscle once for 1 dose., Disp: 0.5 mL, Rfl: 0   No Known Allergies  The patient states she uses post menopausal status for birth control. Last LMP was No LMP recorded. Patient is postmenopausal.. Negative for Dysmenorrhea. Negative for: breast discharge, breast lump(s), breast pain and breast self exam. Associated symptoms include abnormal vaginal bleeding. Pertinent negatives include abnormal bleeding (hematology), anxiety, decreased libido, depression, difficulty falling sleep, dyspareunia, history of infertility, nocturia, sexual dysfunction, sleep disturbances, urinary incontinence, urinary urgency, vaginal discharge and vaginal itching. Diet regular.The patient states her exercise level is  minimal.   . The  patient's tobacco use is:  Social History   Tobacco Use  Smoking Status Never Smoker  Smokeless Tobacco Never Used  . She has been exposed to passive smoke. The patient's alcohol use is:  Social History   Substance and Sexual Activity  Alcohol Use No    Review of Systems  Constitutional: Negative.   HENT: Negative.   Eyes: Negative.  Negative for blurred vision.  Respiratory: Negative.  Negative for shortness of breath.   Cardiovascular: Negative.  Negative for chest pain and palpitations.  Gastrointestinal: Negative.   Endocrine: Negative.   Genitourinary: Negative.   Musculoskeletal: Negative.   Skin: Negative.   Allergic/Immunologic: Negative.   Neurological: Negative.   Hematological: Negative.   Psychiatric/Behavioral: Negative.      Today's Vitals   11/03/19 0918  BP: 120/78  Pulse: 87  Temp: 97.6 F (36.4 C)  TempSrc: Oral  Weight: 231 lb (104.8 kg)  Height: 5' 2"  (1.575 m)   Body mass index is 42.25 kg/m.   Objective:  Physical Exam Vitals and nursing note reviewed.  Constitutional:      General: She is not in acute distress.    Appearance: Normal appearance. She is well-developed. She is obese.  HENT:     Head: Normocephalic and atraumatic.     Right Ear: Hearing, tympanic membrane, ear canal and external ear normal. There is no impacted cerumen.     Left Ear: Hearing, tympanic membrane, ear canal and external ear normal. There is no impacted cerumen.     Nose:     Comments: Deferred, masked    Mouth/Throat:     Comments: Deferred, masked Eyes:     General: Lids are normal.     Extraocular Movements: Extraocular movements intact.     Conjunctiva/sclera: Conjunctivae normal.     Pupils: Pupils are equal, round, and reactive to light.     Funduscopic exam:    Right eye: No papilledema.        Left eye: No papilledema.  Neck:     Thyroid: No thyroid mass.     Vascular: No carotid bruit.  Cardiovascular:     Rate and Rhythm: Normal rate and  regular rhythm.     Pulses: Normal pulses.     Heart sounds: Normal heart sounds. No murmur heard.   Pulmonary:     Effort: Pulmonary effort is normal.     Breath sounds: Normal breath sounds.  Chest:     Breasts: Tanner Score is 5.        Right: Normal.        Left: Normal.     Comments: Pendulous Abdominal:     General: Bowel sounds are normal. There is no distension.     Palpations: Abdomen is soft.     Tenderness: There is no abdominal tenderness.     Comments: Rounded, soft. Healed surgical scars.  Genitourinary:    Comments: Deferred Musculoskeletal:        General: No swelling. Normal range  of motion.     Cervical back: Full passive range of motion without pain, normal range of motion and neck supple.     Right lower leg: No edema.     Left lower leg: No edema.  Skin:    General: Skin is warm and dry.     Capillary Refill: Capillary refill takes less than 2 seconds.  Neurological:     General: No focal deficit present.     Mental Status: She is alert and oriented to person, place, and time.     Cranial Nerves: No cranial nerve deficit.     Sensory: No sensory deficit.  Psychiatric:        Mood and Affect: Mood normal.        Behavior: Behavior normal.        Thought Content: Thought content normal.        Judgment: Judgment normal.         Assessment And Plan:     1. Routine general medical examination at health care facility Comments: A full exam was performed.  Importance of monthly self breast exams was discussed with the patient. PATIENT IS ADVISED TO GET 30-45 MINUTES REGULAR EXERCISE NO LESS THAN FOUR TO FIVE DAYS PER WEEK - BOTH WEIGHTBEARING EXERCISES AND AEROBIC ARE RECOMMENDED.  PATIENT IS ADVISED TO FOLLOW A HEALTHY DIET WITH AT LEAST SIX FRUITS/VEGGIES PER DAY, DECREASE INTAKE OF RED MEAT, AND TO INCREASE FISH INTAKE TO TWO DAYS PER WEEK.  MEATS/FISH SHOULD NOT BE FRIED, BAKED OR BROILED IS PREFERABLE.  I SUGGEST WEARING SPF 50 SUNSCREEN ON EXPOSED PARTS  AND ESPECIALLY WHEN IN THE DIRECT SUNLIGHT FOR AN EXTENDED PERIOD OF TIME.  PLEASE AVOID FAST FOOD RESTAURANTS AND INCREASE YOUR WATER INTAKE.  - Hemoglobin A1c - CBC - CMP14+EGFR - Lipid panel  2. Parenchymal renal hypertension, stage 1 through stage 4 or unspecified chronic kidney disease Comments: Chronic, well controlled. She will continue with current meds. She is encouraged to avoid adding salt to her foods. EKG performed, NSR w/o acute changes. She will rto in six months for re-evaluation.   - POCT Urinalysis Dipstick (81002) - POCT UA - Microalbumin - EKG 12-Lead  3. Chronic renal disease, stage II Comments: Chronic, I will check renal function today. Encouraged to stay well hydrated.   4. Primary hypothyroidism Comments: I will check thyroid panel and adjust meds as needed.  - TSH - T4, Free  5. Vitamin D deficiency disease Comments: I will check vitamin D level and supplemnet as needed.  - VITAMIN D 25 Hydroxy (Vit-D Deficiency, Fractures)  6. Class 3 severe obesity due to excess calories with serious comorbidity and body mass index (BMI) of 40.0 to 44.9 in adult [facility]) Comments: BMI 42. Encouraged to strive for BMI less than 35 to decrease cardiac risk. Advised to incorporate more chair exercises into her daily routine.   7. Immunization due Comments: She was given rx Shingrix.  Pneumovax was given during her visit.    Patient was given opportunity to ask questions. Patient verbalized understanding of the plan and was able to repeat key elements of the plan. All questions were answered to their satisfaction.   Erin Greenland, MD   I, Erin Greenland, MD, have reviewed all documentation for this visit. The documentation on 11/08/19 for the exam, diagnosis, procedures, and orders are all accurate and complete.  THE PATIENT IS ENCOURAGED TO PRACTICE SOCIAL DISTANCING DUE TO THE COVID-19 PANDEMIC.

## 2019-11-04 LAB — CMP14+EGFR
ALT: 14 IU/L (ref 0–32)
AST: 18 IU/L (ref 0–40)
Albumin/Globulin Ratio: 1.6 (ref 1.2–2.2)
Albumin: 4.7 g/dL (ref 3.8–4.8)
Alkaline Phosphatase: 85 IU/L (ref 44–121)
BUN/Creatinine Ratio: 15 (ref 12–28)
BUN: 17 mg/dL (ref 8–27)
Bilirubin Total: 0.7 mg/dL (ref 0.0–1.2)
CO2: 20 mmol/L (ref 20–29)
Calcium: 9.6 mg/dL (ref 8.7–10.3)
Chloride: 102 mmol/L (ref 96–106)
Creatinine, Ser: 1.11 mg/dL — ABNORMAL HIGH (ref 0.57–1.00)
GFR calc Af Amer: 60 mL/min/{1.73_m2} (ref >59)
GFR calc non Af Amer: 52 mL/min/{1.73_m2} — ABNORMAL LOW (ref >59)
Globulin, Total: 3 g/dL (ref 1.5–4.5)
Glucose: 94 mg/dL (ref 65–99)
Potassium: 4.3 mmol/L (ref 3.5–5.2)
Sodium: 140 mmol/L (ref 134–144)
Total Protein: 7.7 g/dL (ref 6.0–8.5)

## 2019-11-04 LAB — LIPID PANEL
Chol/HDL Ratio: 3 ratio (ref 0.0–4.4)
Cholesterol, Total: 195 mg/dL (ref 100–199)
HDL: 66 mg/dL (ref >39)
LDL Chol Calc (NIH): 108 mg/dL — ABNORMAL HIGH (ref 0–99)
Triglycerides: 122 mg/dL (ref 0–149)
VLDL Cholesterol Cal: 21 mg/dL (ref 5–40)

## 2019-11-04 LAB — CBC
Hematocrit: 38.7 % (ref 34.0–46.6)
Hemoglobin: 12.9 g/dL (ref 11.1–15.9)
MCH: 28.7 pg (ref 26.6–33.0)
MCHC: 33.3 g/dL (ref 31.5–35.7)
MCV: 86 fL (ref 79–97)
Platelets: 356 10*3/uL (ref 150–450)
RBC: 4.5 x10E6/uL (ref 3.77–5.28)
RDW: 12.8 % (ref 11.7–15.4)
WBC: 9 10*3/uL (ref 3.4–10.8)

## 2019-11-04 LAB — VITAMIN D 25 HYDROXY (VIT D DEFICIENCY, FRACTURES): Vit D, 25-Hydroxy: 38.3 ng/mL (ref 30.0–100.0)

## 2019-11-04 LAB — HEMOGLOBIN A1C
Est. average glucose Bld gHb Est-mCnc: 120 mg/dL
Hgb A1c MFr Bld: 5.8 % — ABNORMAL HIGH (ref 4.8–5.6)

## 2019-11-04 LAB — T4, FREE: Free T4: 1.7 ng/dL (ref 0.82–1.77)

## 2019-11-04 LAB — TSH: TSH: 1.27 u[IU]/mL (ref 0.450–4.500)

## 2019-11-08 MED ORDER — SHINGRIX 50 MCG/0.5ML IM SUSR: 0.5000 mL | Freq: Once | INTRAMUSCULAR | 0 refills | Status: AC

## 2019-11-10 ENCOUNTER — Encounter: Payer: Self-pay | Admitting: Internal Medicine

## 2019-11-13 DIAGNOSIS — Z1231 Encounter for screening mammogram for malignant neoplasm of breast: Secondary | ICD-10-CM | POA: Diagnosis not present

## 2019-11-13 LAB — HM MAMMOGRAPHY

## 2019-11-17 ENCOUNTER — Encounter: Payer: Self-pay | Admitting: Internal Medicine

## 2019-11-19 ENCOUNTER — Encounter: Payer: Self-pay | Admitting: Internal Medicine

## 2019-12-02 ENCOUNTER — Encounter: Payer: Self-pay | Admitting: Internal Medicine

## 2019-12-24 MED FILL — AMLODIPINE BESYLATE 5 MG TA: 5 | 90 days supply | Qty: 90 | Fill #1

## 2020-01-01 ENCOUNTER — Encounter: Payer: Self-pay | Admitting: Internal Medicine

## 2020-01-05 DIAGNOSIS — Z20822 Contact with and (suspected) exposure to covid-19: Secondary | ICD-10-CM | POA: Diagnosis not present

## 2020-01-11 ENCOUNTER — Encounter: Payer: Self-pay | Admitting: Internal Medicine

## 2020-02-22 ENCOUNTER — Encounter: Payer: Self-pay | Admitting: Internal Medicine

## 2020-02-23 ENCOUNTER — Other Ambulatory Visit: Payer: Self-pay | Admitting: Internal Medicine

## 2020-02-23 ENCOUNTER — Encounter: Payer: Self-pay | Admitting: Internal Medicine

## 2020-02-23 MED ORDER — SHINGRIX 50 MCG/0.5ML IM SUSR: 0.5000 mL | Freq: Once | INTRAMUSCULAR | 0 refills | Status: DC

## 2020-02-23 MED FILL — SHINGRIX 50 MCG SUS: 50 | 1 days supply | Qty: 1 | Fill #0

## 2020-02-25 ENCOUNTER — Encounter: Payer: Self-pay | Admitting: Internal Medicine

## 2020-03-07 DIAGNOSIS — Z1152 Encounter for screening for COVID-19: Secondary | ICD-10-CM | POA: Diagnosis not present

## 2020-03-07 DIAGNOSIS — U071 COVID-19: Secondary | ICD-10-CM | POA: Diagnosis not present

## 2020-03-10 ENCOUNTER — Telehealth: Payer: Self-pay

## 2020-03-10 NOTE — Telephone Encounter (Signed)
The pt was notified to make sure she is taking her vitamin c, d and zinc because she is covid positive. The pt said she is feeling ok.

## 2020-03-14 ENCOUNTER — Other Ambulatory Visit: Payer: Self-pay | Admitting: Internal Medicine

## 2020-03-14 MED FILL — LEVOTHYROXINE SODIUM 112 MC: 112 | 90 days supply | Qty: 90 | Fill #0

## 2020-03-16 ENCOUNTER — Encounter: Payer: Self-pay | Admitting: Internal Medicine

## 2020-04-11 ENCOUNTER — Other Ambulatory Visit: Payer: Self-pay | Admitting: Internal Medicine

## 2020-04-11 MED FILL — LISINOPRIL 20 MG TABLET: 20 | 90 days supply | Qty: 90 | Fill #0

## 2020-04-14 ENCOUNTER — Encounter: Payer: Self-pay | Admitting: Internal Medicine

## 2020-04-25 ENCOUNTER — Other Ambulatory Visit: Payer: Self-pay | Admitting: Internal Medicine

## 2020-04-28 ENCOUNTER — Encounter: Payer: Self-pay | Admitting: Internal Medicine

## 2020-05-02 ENCOUNTER — Encounter: Payer: Self-pay | Admitting: Internal Medicine

## 2020-05-03 ENCOUNTER — Other Ambulatory Visit: Payer: Self-pay

## 2020-05-03 ENCOUNTER — Encounter: Payer: Self-pay | Admitting: Internal Medicine

## 2020-05-03 ENCOUNTER — Ambulatory Visit: Payer: 59 | Admitting: Internal Medicine

## 2020-05-03 VITALS — BP 138/80 | HR 95 | Temp 98.2°F | Ht 60.6 in | Wt 227.2 lb

## 2020-05-03 DIAGNOSIS — I129 Hypertensive chronic kidney disease with stage 1 through stage 4 chronic kidney disease, or unspecified chronic kidney disease: Secondary | ICD-10-CM

## 2020-05-03 DIAGNOSIS — I739 Peripheral vascular disease, unspecified: Secondary | ICD-10-CM

## 2020-05-03 DIAGNOSIS — Z6841 Body Mass Index (BMI) 40.0 and over, adult: Secondary | ICD-10-CM | POA: Diagnosis not present

## 2020-05-03 DIAGNOSIS — N182 Chronic kidney disease, stage 2 (mild): Secondary | ICD-10-CM | POA: Diagnosis not present

## 2020-05-03 DIAGNOSIS — R7309 Other abnormal glucose: Secondary | ICD-10-CM

## 2020-05-03 DIAGNOSIS — E039 Hypothyroidism, unspecified: Secondary | ICD-10-CM | POA: Diagnosis not present

## 2020-05-03 NOTE — Patient Instructions (Signed)

## 2020-05-03 NOTE — Progress Notes (Signed)
I,Tianna Badgett,acting as a Education administrator for [name], MD.,have documented all relevant documentation on the behalf of [name], MD,as directed by  [name], MD while in the presence of [name], MD.  This visit occurred during the SARS-CoV-2 public health emergency.  Safety protocols were in place, including screening questions prior to the visit, additional usage of staff PPE, and extensive cleaning of exam room while observing appropriate contact time as indicated for disinfecting solutions.  Subjective:     Patient ID: [name] , female    DOB: 1955/09/02 , 65 y.o.   MRN: [id number]   Chief Complaint  Patient presents with  . Hypertension    HPI  She presents today for BP check. She reports compliance with meds. She denies headaches, chest pain and shortness of breath. She is not sure when she will retire - perhaps later this year.   Hypertension This is a chronic problem. The current episode started more than 1 year ago. The problem has been gradually improving since onset. The problem is controlled. Pertinent negatives include no blurred vision, chest pain, palpitations or shortness of breath. Risk factors for coronary artery disease include obesity, sedentary lifestyle and post-menopausal state. Compliance problems include exercise.      Past Medical History:  Diagnosis Date  . Arthritis   . DOE (dyspnea on exertion)    mild  . Hyperlipidemia   . Hypertension   . Hypothyroidism   . Morbid obesity ([place])   . Pre-diabetes   . Schamberg disease      Family History  Problem Relation Age of Onset  . Hypertension Mother   . Heart Problems Mother        Pacemaker  . Heart attack Father        at age 40.  Marland Kitchen Heart attack Maternal Grandfather 40       Died of AAA  . AAA (abdominal aortic aneurysm) Maternal Grandfather   . Heart attack Maternal Uncle   . Stroke Paternal Aunt   . Alzheimer's disease Maternal Grandmother      Current  Outpatient Medications:  .  Calcium Carbonate-Vitamin D 600-400 MG-UNIT tablet, Take 1 tablet by mouth daily., Disp: , Rfl:  .  Cholecalciferol (VITAMIN D) 2000 units tablet, Take 4,000 Units by mouth daily., Disp: , Rfl:  .  Magnesium 400 MG TABS, Take by mouth., Disp: , Rfl:  .  Multiple Vitamins-Minerals (CENTRUM SILVER 50+WOMEN PO), Take by mouth. 1 per day, Disp: , Rfl:  .  potassium chloride SA (K-DUR,KLOR-CON) 20 MEQ tablet, Take 20 mEq by mouth daily as needed (when taking Torsemide). , Disp: , Rfl:  .  torsemide (DEMADEX) 20 MG tablet, Take 20 mg by mouth daily as needed (for edema). , Disp: , Rfl:  .  amLODipine (NORVASC) 5 MG tablet, TAKE 1 TABLET (5 MG TOTAL) BY MOUTH DAILY., Disp: 90 tablet, Rfl: 2 .  levothyroxine (SYNTHROID) 112 MCG tablet, TAKE 1 TABLET BY MOUTH DAILY, Disp: 90 tablet, Rfl: 1 .  lisinopril (ZESTRIL) 20 MG tablet, TAKE 1 TABLET (20 MG TOTAL) BY MOUTH DAILY., Disp: 90 tablet, Rfl: 0 .  simvastatin (ZOCOR) 20 MG tablet, TAKE 1 TABLET (20 MG TOTAL) BY MOUTH DAILY., Disp: 90 tablet, Rfl: 0 .  Zoster Vaccine Adjuvanted (SHINGRIX) injection, INJECT 0.5 ML INTO THE MUSCLE ONCE FOR 1ST DOSE. REPEAT IN 2 TO 6 MONTHS FOR 2ND DOSE., Disp: 1 each, Rfl: 1   No Known Allergies   Review  of Systems  Constitutional: Negative.   Eyes: Negative for blurred vision.  Respiratory: Negative.  Negative for shortness of breath.   Cardiovascular: Negative.  Negative for chest pain and palpitations.  Gastrointestinal: Negative.   Neurological: Negative.      Today's Vitals   05/03/20 1105  BP: 138/80  Pulse: 95  Temp: 98.2 F (36.8 C)  TempSrc: Oral  Weight: 227 lb 3.2 oz (103.1 kg)  Height: 5' 0.6" (1.539 m)   Body mass index is 43.5 kg/m.  Wt Readings from Last 3 Encounters:  05/03/20 227 lb 3.2 oz (103.1 kg)  11/03/19 231 lb (104.8 kg)  09/15/19 235 lb (106.6 kg)     Objective:  Physical Exam Vitals and nursing note reviewed.  Constitutional:      Appearance:  Normal appearance. She is obese.  HENT:     Head: Normocephalic and atraumatic.     Nose:     Comments: Masked     Mouth/Throat:     Comments: Masked  Cardiovascular:     Rate and Rhythm: Normal rate and regular rhythm.     Heart sounds: Normal heart sounds.  Pulmonary:     Effort: Pulmonary effort is normal.     Breath sounds: Normal breath sounds.  Musculoskeletal:     Cervical back: Normal range of motion.  Skin:    General: Skin is warm.  Neurological:     General: No focal deficit present.     Mental Status: She is alert.  Psychiatric:        Mood and Affect: Mood normal.        Behavior: Behavior normal.         Assessment And Plan:     1. Parenchymal renal hypertension, stage 1 through stage 4 or unspecified chronic kidney disease Comments: Chronic, fair control. She is aware optimal BP is less than 130/80. Advised to follow low sodium diet. She will f/u in six months for re-evaluation.  - CMP14+EGFR  2. Chronic renal disease, stage II Comments: Chronic, encouraged to stay well hydrated. Advised to keep BP well controlled to help prevent progression of CKD.  3. Primary hypothyroidism Comments: I will check thyroid panel and adjust meds as needed.  - TSH  4. Other abnormal glucose Comments: Her a1c has been elevated in the past. I will check an a1c today. Encouraged to limit her intake of sugary beverages.  - Hemoglobin A1c  5. Peripheral vascular disease ([place]) Comments: Chronic.  She is encouraged to increase her daily activity to help with circulation.   6. Class 3 severe obesity due to excess calories with serious comorbidity and body mass index (BMI) of 40.0 to 44.9 in adult [facility])  She is encouraged to strive for BMI less than 30 to decrease cardiac risk. Advised to aim for at least 150 minutes of exercise per week.  Patient was given opportunity to ask questions. Patient verbalized understanding of the plan and was able to repeat key elements of the plan.  All questions were answered to their satisfaction.   I, [name], MD, have reviewed all documentation for this visit. The documentation on 05/13/20 for the exam, diagnosis, procedures, and orders are all accurate and complete.   IF YOU HAVE BEEN REFERRED TO A SPECIALIST, IT MAY TAKE 1-2 WEEKS TO SCHEDULE/PROCESS THE REFERRAL. IF YOU HAVE NOT HEARD FROM US/SPECIALIST IN TWO WEEKS, PLEASE GIVE [name] A CALL AT [phone].   THE PATIENT IS ENCOURAGED TO PRACTICE SOCIAL DISTANCING  DUE TO THE COVID-19 PANDEMIC.

## 2020-05-04 LAB — CMP14+EGFR
ALT: 13 IU/L (ref 0–32)
AST: 19 IU/L (ref 0–40)
Albumin/Globulin Ratio: 1.4 (ref 1.2–2.2)
Albumin: 4.4 g/dL (ref 3.8–4.8)
Alkaline Phosphatase: 83 IU/L (ref 44–121)
BUN/Creatinine Ratio: 18 (ref 12–28)
BUN: 22 mg/dL (ref 8–27)
Bilirubin Total: 0.4 mg/dL (ref 0.0–1.2)
CO2: 22 mmol/L (ref 20–29)
Calcium: 9.6 mg/dL (ref 8.7–10.3)
Chloride: 101 mmol/L (ref 96–106)
Creatinine, Ser: 1.19 mg/dL — ABNORMAL HIGH (ref 0.57–1.00)
Globulin, Total: 3.1 g/dL (ref 1.5–4.5)
Glucose: 120 mg/dL — ABNORMAL HIGH (ref 65–99)
Potassium: 4.3 mmol/L (ref 3.5–5.2)
Sodium: 140 mmol/L (ref 134–144)
Total Protein: 7.5 g/dL (ref 6.0–8.5)
eGFR: 50 mL/min/{1.73_m2} — ABNORMAL LOW (ref >59)

## 2020-05-04 LAB — HEMOGLOBIN A1C
Est. average glucose Bld gHb Est-mCnc: 123 mg/dL
Hgb A1c MFr Bld: 5.9 % — ABNORMAL HIGH (ref 4.8–5.6)

## 2020-05-04 LAB — TSH: TSH: 1.01 u[IU]/mL (ref 0.450–4.500)

## 2020-06-16 ENCOUNTER — Other Ambulatory Visit (HOSPITAL_COMMUNITY): Payer: Self-pay

## 2020-06-16 MED FILL — Levothyroxine Sodium Tab 112 MCG: ORAL | 90 days supply | Qty: 90 | Fill #0 | Status: AC

## 2020-07-11 ENCOUNTER — Other Ambulatory Visit: Payer: Self-pay | Admitting: Internal Medicine

## 2020-07-11 MED FILL — Amlodipine Besylate Tab 5 MG (Base Equivalent): ORAL | 90 days supply | Qty: 90 | Fill #0 | Status: AC

## 2020-07-12 ENCOUNTER — Other Ambulatory Visit (HOSPITAL_COMMUNITY): Payer: Self-pay

## 2020-07-12 MED ORDER — LISINOPRIL 20 MG PO TABS
20.0000 mg | ORAL_TABLET | Freq: Every day | ORAL | 0 refills | Status: DC
  Filled 2020-07-12: qty 90, 90d supply, fill #0

## 2020-07-31 ENCOUNTER — Other Ambulatory Visit: Payer: Self-pay | Admitting: Internal Medicine

## 2020-08-01 ENCOUNTER — Other Ambulatory Visit (HOSPITAL_COMMUNITY): Payer: Self-pay

## 2020-08-01 MED ORDER — SIMVASTATIN 20 MG PO TABS
20.0000 mg | ORAL_TABLET | Freq: Every day | ORAL | 0 refills | Status: DC
  Filled 2020-08-01: qty 90, 90d supply, fill #0

## 2020-08-05 ENCOUNTER — Encounter: Payer: Self-pay | Admitting: Internal Medicine

## 2020-08-08 ENCOUNTER — Other Ambulatory Visit (HOSPITAL_COMMUNITY): Payer: Self-pay

## 2020-08-08 ENCOUNTER — Other Ambulatory Visit: Payer: Self-pay

## 2020-08-08 MED ORDER — TORSEMIDE 20 MG PO TABS
20.0000 mg | ORAL_TABLET | Freq: Every day | ORAL | 1 refills | Status: DC | PRN
  Filled 2020-08-08: qty 90, 90d supply, fill #0
  Filled 2021-01-26: qty 90, 90d supply, fill #1

## 2020-08-08 MED ORDER — POTASSIUM CHLORIDE CRYS ER 20 MEQ PO TBCR
20.0000 meq | EXTENDED_RELEASE_TABLET | Freq: Every day | ORAL | 1 refills | Status: DC | PRN
Start: 2020-08-08 — End: 2022-08-02
  Filled 2020-08-08: qty 90, 90d supply, fill #0

## 2020-08-23 DIAGNOSIS — H2511 Age-related nuclear cataract, right eye: Secondary | ICD-10-CM | POA: Diagnosis not present

## 2020-08-23 DIAGNOSIS — H2512 Age-related nuclear cataract, left eye: Secondary | ICD-10-CM | POA: Diagnosis not present

## 2020-09-06 ENCOUNTER — Encounter: Payer: Self-pay | Admitting: Internal Medicine

## 2020-09-09 DIAGNOSIS — H2511 Age-related nuclear cataract, right eye: Secondary | ICD-10-CM | POA: Diagnosis not present

## 2020-09-09 DIAGNOSIS — H25812 Combined forms of age-related cataract, left eye: Secondary | ICD-10-CM | POA: Diagnosis not present

## 2020-09-09 DIAGNOSIS — H2512 Age-related nuclear cataract, left eye: Secondary | ICD-10-CM | POA: Diagnosis not present

## 2020-09-09 DIAGNOSIS — Z01818 Encounter for other preprocedural examination: Secondary | ICD-10-CM | POA: Diagnosis not present

## 2020-09-22 ENCOUNTER — Other Ambulatory Visit (HOSPITAL_COMMUNITY): Payer: Self-pay

## 2020-09-22 ENCOUNTER — Other Ambulatory Visit: Payer: Self-pay | Admitting: Internal Medicine

## 2020-09-22 MED ORDER — LEVOTHYROXINE SODIUM 112 MCG PO TABS
112.0000 ug | ORAL_TABLET | Freq: Every day | ORAL | 1 refills | Status: DC
  Filled 2020-09-22: qty 90, 90d supply, fill #0
  Filled 2021-01-04: qty 90, 90d supply, fill #1

## 2020-09-29 DIAGNOSIS — H25811 Combined forms of age-related cataract, right eye: Secondary | ICD-10-CM | POA: Diagnosis not present

## 2020-09-29 DIAGNOSIS — H2511 Age-related nuclear cataract, right eye: Secondary | ICD-10-CM | POA: Diagnosis not present

## 2020-10-12 ENCOUNTER — Encounter: Payer: Self-pay | Admitting: Internal Medicine

## 2020-10-26 ENCOUNTER — Other Ambulatory Visit: Payer: Self-pay | Admitting: Internal Medicine

## 2020-10-26 ENCOUNTER — Other Ambulatory Visit (HOSPITAL_COMMUNITY): Payer: Self-pay

## 2020-10-26 MED ORDER — LISINOPRIL 20 MG PO TABS
20.0000 mg | ORAL_TABLET | Freq: Every day | ORAL | 0 refills | Status: DC
  Filled 2020-10-26: qty 90, 90d supply, fill #0

## 2020-11-08 ENCOUNTER — Other Ambulatory Visit: Payer: Self-pay | Admitting: Internal Medicine

## 2020-11-09 ENCOUNTER — Other Ambulatory Visit (HOSPITAL_COMMUNITY): Payer: Self-pay

## 2020-11-09 MED ORDER — SIMVASTATIN 20 MG PO TABS
20.0000 mg | ORAL_TABLET | Freq: Every day | ORAL | 0 refills | Status: DC
  Filled 2020-11-09: qty 90, 90d supply, fill #0

## 2020-11-10 ENCOUNTER — Ambulatory Visit: Payer: 59 | Admitting: Internal Medicine

## 2020-11-18 DIAGNOSIS — Z1231 Encounter for screening mammogram for malignant neoplasm of breast: Secondary | ICD-10-CM | POA: Diagnosis not present

## 2020-11-18 LAB — HM MAMMOGRAPHY

## 2020-11-22 ENCOUNTER — Encounter: Payer: Self-pay | Admitting: Internal Medicine

## 2020-11-29 ENCOUNTER — Other Ambulatory Visit: Payer: Self-pay | Admitting: Internal Medicine

## 2020-11-30 ENCOUNTER — Other Ambulatory Visit (HOSPITAL_COMMUNITY): Payer: Self-pay

## 2020-11-30 ENCOUNTER — Other Ambulatory Visit: Payer: Self-pay | Admitting: Internal Medicine

## 2020-11-30 ENCOUNTER — Encounter: Payer: Self-pay | Admitting: Internal Medicine

## 2020-11-30 MED ORDER — AMLODIPINE BESYLATE 5 MG PO TABS
5.0000 mg | ORAL_TABLET | Freq: Every day | ORAL | 2 refills | Status: DC
  Filled 2020-11-30: qty 90, 90d supply, fill #0
  Filled 2021-06-15: qty 90, 90d supply, fill #1

## 2020-12-12 ENCOUNTER — Encounter: Payer: Self-pay | Admitting: Internal Medicine

## 2020-12-14 ENCOUNTER — Encounter: Payer: 59 | Admitting: Nurse Practitioner

## 2021-01-04 ENCOUNTER — Other Ambulatory Visit (HOSPITAL_COMMUNITY): Payer: Self-pay

## 2021-01-11 ENCOUNTER — Encounter: Payer: Self-pay | Admitting: Internal Medicine

## 2021-01-12 ENCOUNTER — Other Ambulatory Visit: Payer: Self-pay

## 2021-01-12 ENCOUNTER — Ambulatory Visit (INDEPENDENT_AMBULATORY_CARE_PROVIDER_SITE_OTHER): Payer: 59 | Admitting: Nurse Practitioner

## 2021-01-12 VITALS — BP 124/80 | HR 93 | Temp 97.7°F | Ht 60.6 in | Wt 225.8 lb

## 2021-01-12 DIAGNOSIS — Z79899 Other long term (current) drug therapy: Secondary | ICD-10-CM

## 2021-01-12 DIAGNOSIS — Z6841 Body Mass Index (BMI) 40.0 and over, adult: Secondary | ICD-10-CM

## 2021-01-12 DIAGNOSIS — E559 Vitamin D deficiency, unspecified: Secondary | ICD-10-CM

## 2021-01-12 DIAGNOSIS — I739 Peripheral vascular disease, unspecified: Secondary | ICD-10-CM | POA: Diagnosis not present

## 2021-01-12 DIAGNOSIS — Z0000 Encounter for general adult medical examination without abnormal findings: Secondary | ICD-10-CM

## 2021-01-12 DIAGNOSIS — R82998 Other abnormal findings in urine: Secondary | ICD-10-CM

## 2021-01-12 DIAGNOSIS — E039 Hypothyroidism, unspecified: Secondary | ICD-10-CM | POA: Diagnosis not present

## 2021-01-12 DIAGNOSIS — Z0001 Encounter for general adult medical examination with abnormal findings: Secondary | ICD-10-CM

## 2021-01-12 DIAGNOSIS — E785 Hyperlipidemia, unspecified: Secondary | ICD-10-CM

## 2021-01-12 DIAGNOSIS — R9431 Abnormal electrocardiogram [ECG] [EKG]: Secondary | ICD-10-CM

## 2021-01-12 DIAGNOSIS — R7309 Other abnormal glucose: Secondary | ICD-10-CM

## 2021-01-12 DIAGNOSIS — N189 Chronic kidney disease, unspecified: Secondary | ICD-10-CM

## 2021-01-12 DIAGNOSIS — I129 Hypertensive chronic kidney disease with stage 1 through stage 4 chronic kidney disease, or unspecified chronic kidney disease: Secondary | ICD-10-CM | POA: Diagnosis not present

## 2021-01-12 LAB — POCT URINALYSIS DIPSTICK
Bilirubin, UA: NEGATIVE
Blood, UA: NEGATIVE
Glucose, UA: NEGATIVE
Ketones, UA: NEGATIVE
Nitrite, UA: NEGATIVE
Protein, UA: NEGATIVE
Spec Grav, UA: 1.01 (ref 1.010–1.025)
Urobilinogen, UA: 0.2 E.U./dL
pH, UA: 5 (ref 5.0–8.0)

## 2021-01-12 LAB — POCT UA - MICROALBUMIN
Albumin/Creatinine Ratio, Urine, POC: 30
Creatinine, POC: 50 mg/dL
Microalbumin Ur, POC: 10 mg/L

## 2021-01-12 NOTE — Patient Instructions (Signed)

## 2021-01-12 NOTE — Progress Notes (Signed)
I,Jameka J Llittleton,acting as a Education administrator for [employer], NP.,have documented all relevant documentation on the behalf of [employer], NP,as directed by  Bary Castilla, NP while in the presence of Bary Castilla, NP.  This visit occurred during the SARS-CoV-2 public health emergency.  Safety protocols were in place, including screening questions prior to the visit, additional usage of staff PPE, and extensive cleaning of exam room while observing appropriate contact time as indicated for disinfecting solutions.  Subjective:     Patient ID: [name] , female    DOB: 05-04-1955 , 65 y.o.   MRN: [id number]   Chief Complaint  Patient presents with   Annual Exam    HPI  Patient is here for hm. Patient reports compliance with her meds She has an appt with the wound doctor in Jan. For her wound on her left leg which she has bandage. Its open wound and she has had it before.  Diet: she is trying to drink more water. She has cut down on soda . Exercise: she tries to get up and walk. She has arthritis in the knee.  She uses the icy hot to help with the arthritis pain.   Wt Readings from Last 3 Encounters: 01/12/21 : 225 lb 12.8 oz (102.4 kg) 05/03/20 : 227 lb 3.2 oz (103.1 kg) 11/03/19 : 231 lb (104.8 kg)  She has had flu shot, covid shots, pneumonia age. She has had 2 shingles shot.  She had 2 cataract surgeries in the summer.     Past Medical History:  Diagnosis Date   Arthritis    DOE (dyspnea on exertion)    mild   Hyperlipidemia    Hypertension    Hypothyroidism    Morbid obesity ([place])    Pre-diabetes    Schamberg disease      Family History  Problem Relation Age of Onset   Hypertension Mother    Heart Problems Mother        Pacemaker   Heart attack Father        at age 37.   Heart attack Maternal Grandfather 5       Died of AAA   AAA (abdominal aortic aneurysm) Maternal Grandfather    Heart attack Maternal Uncle    Stroke Paternal Aunt     Alzheimer's disease Maternal Grandmother      Current Outpatient Medications:    amLODipine (NORVASC) 5 MG tablet, Take 1 tablet (5 mg total) by mouth daily., Disp: 90 tablet, Rfl: 2   Calcium Carbonate-Vitamin D 600-400 MG-UNIT tablet, Take 1 tablet by mouth daily., Disp: , Rfl:    Cholecalciferol (VITAMIN D) 2000 units tablet, Take 4,000 Units by mouth daily., Disp: , Rfl:    levothyroxine (SYNTHROID) 112 MCG tablet, Take 1 tablet (112 mcg total) by mouth daily., Disp: 90 tablet, Rfl: 1   lisinopril (ZESTRIL) 20 MG tablet, Take 1 tablet (20 mg total) by mouth daily., Disp: 90 tablet, Rfl: 0   Magnesium 400 MG TABS, Take by mouth., Disp: , Rfl:    Multiple Vitamins-Minerals (CENTRUM SILVER 50+WOMEN PO), Take by mouth. 1 per day, Disp: , Rfl:    potassium chloride SA (KLOR-CON) 20 MEQ tablet, Take 1 tablet (20 mEq total) by mouth daily as needed (when taking Torsemide)., Disp: 90 tablet, Rfl: 1   simvastatin (ZOCOR) 20 MG tablet, Take 1 tablet (20 mg total) by mouth daily., Disp: 90 tablet, Rfl: 0   torsemide (DEMADEX) 20 MG tablet, Take 1 tablet (20 mg  total) by mouth daily as needed (for edema)., Disp: 90 tablet, Rfl: 1   No Known Allergies    The patient states she uses none for birth control. Last LMP was No LMP recorded. Patient is postmenopausal.. Negative for Dysmenorrhea. Negative for: breast discharge, breast lump(s), breast pain and breast self exam. Associated symptoms include abnormal vaginal bleeding. Pertinent negatives include abnormal bleeding (hematology), anxiety, decreased libido, depression, difficulty falling sleep, dyspareunia, history of infertility, nocturia, sexual dysfunction, sleep disturbances, urinary incontinence, urinary urgency, vaginal discharge and vaginal itching. Diet regular.The patient states her exercise level is    . The patient's tobacco use is:  Social History   Tobacco Use  Smoking Status Never  Smokeless Tobacco Never  . She has been exposed to  passive smoke. The patient's alcohol use is:  Social History   Substance and Sexual Activity  Alcohol Use No  . Additional information: Last pap , next one scheduled for .    Review of Systems  Constitutional: Negative.  Negative for chills, fatigue and fever.  HENT: Negative.  Negative for congestion.   Eyes: Negative.   Respiratory: Negative.  Negative for cough, shortness of breath and wheezing.   Cardiovascular: Negative.  Negative for chest pain and palpitations.  Gastrointestinal: Negative.  Negative for abdominal pain, constipation, diarrhea and vomiting.  Endocrine: Negative.  Negative for polydipsia, polyphagia and polyuria.  Genitourinary: Negative.  Negative for decreased urine volume, dysuria and frequency.  Musculoskeletal: Negative.  Negative for arthralgias and myalgias.  Skin:  Positive for wound.       Wound to her left leg covered with bandage by patient.   Allergic/Immunologic: Negative.   Neurological: Negative.   Hematological: Negative.   Psychiatric/Behavioral: Negative.      Today's Vitals   01/12/21 1005  BP: 124/80  Pulse: 93  Temp: 97.7 F (36.5 C)  Weight: 225 lb 12.8 oz (102.4 kg)  Height: 5' 0.6" (1.539 m)   Body mass index is 43.23 kg/m.  Wt Readings from Last 3 Encounters:  01/12/21 225 lb 12.8 oz (102.4 kg)  05/03/20 227 lb 3.2 oz (103.1 kg)  11/03/19 231 lb (104.8 kg)     Objective:  Physical Exam Vitals and nursing note reviewed.  Constitutional:      Appearance: Normal appearance. She is obese.  HENT:     Head: Normocephalic and atraumatic.     Right Ear: Tympanic membrane, ear canal and external ear normal. There is no impacted cerumen.     Left Ear: Tympanic membrane, ear canal and external ear normal. There is no impacted cerumen.     Nose: Nose normal. No congestion or rhinorrhea.     Comments: Deferred. Masked     Mouth/Throat:     Comments: Masked  Eyes:     Extraocular Movements: Extraocular movements intact.      Conjunctiva/sclera: Conjunctivae normal.     Pupils: Pupils are equal, round, and reactive to light.  Cardiovascular:     Rate and Rhythm: Normal rate and regular rhythm.     Pulses: Normal pulses.     Heart sounds: Normal heart sounds.  Pulmonary:     Effort: Pulmonary effort is normal. No respiratory distress.     Breath sounds: Normal breath sounds. No wheezing.  Chest:  Breasts:    Tanner Score is 5.     Right: Normal.     Left: Normal.  Abdominal:     General: Abdomen is flat. Bowel sounds are normal.  Palpations: Abdomen is soft.  Genitourinary:    Comments: deferred Musculoskeletal:        General: Normal range of motion.     Cervical back: Normal range of motion and neck supple.  Skin:    General: Skin is warm and dry.     Capillary Refill: Capillary refill takes less than 2 seconds.  Neurological:     General: No focal deficit present.     Mental Status: She is alert and oriented to person, place, and time.  Psychiatric:        Mood and Affect: Mood normal.        Behavior: Behavior normal.        Assessment And Plan:     1. Encounter for general adult medical examination w/o abnormal findings -Patient is here for their annual physical exam and we discussed any changes to medication and medical history.  -Behavior modification was discussed as well as diet and exercise history  -Patient will continue to exercise regularly and modify their diet.  -Recommendation for yearly physical annuals, immunization and screenings including mammogram and colonoscopy were discussed with the patient.  -Recommended intake of multivitamin, vitamin D and calcium.  -Individualized advise was given to the patient pertaining to their own health history in regards to diet, exercise, medical condition and referrals.   2. Parenchymal renal hypertension, stage 1 through stage 4 or unspecified chronic kidney disease Stable, continue meds. Will check labs  -BP today in office is stable  at 124/80  - POCT Urinalysis Dipstick (81002) - POCT UA - Microalbumin - EKG 12-Lead - CMP14+EGFR  3. Primary hypothyroidism -Will check labs today.  Currently taking 112 mcg daily  - TSH - T4, Free  4. Other abnormal glucose - Hemoglobin A1c  5. Vitamin D deficiency disease -Will check and supplement if needed. Advised patient to spend atleast 15 min. Daily in sunlight.  - Vitamin D (25 hydroxy)  6. Hyperlipidemia, unspecified hyperlipidemia type -will check labs today  - Lipid panel  7. Leukocytes in urine -No complaints of UTI symptoms but will send urine for  culture due to leukocytes in urine.  - Culture, Urine  8. Encounter for long-term (current) use of medications - CBC  9. Abnormal EKG - Ambulatory referral to Cardiology  10. Peripheral vascular disease ([place]) -Followed by vein specialist; has appt in Jan. For wound on her leg and legs.   11. Class 3 severe obesity due to excess calories with serious comorbidity and body mass index (BMI) of 40.0 to 44.9 in adult [facility]) -Advised patient on a healthy diet including avoiding fast food and red meats. Increase the intake of lean meats including grilled chicken and [place].  Drink a lot of water. Decrease intake of fatty foods. Exercise for 30-45 min. 4-5 a week to decrease the risk of cardiac event.   The patient was encouraged to call or send a message through [place] for any questions or concerns.   Follow up: if symptoms persist or do not get better.   Side effects and appropriate use of all the medication(s) were discussed with the patient today. Patient advised to use the medication(s) as directed by their healthcare provider. The patient was encouraged to read, review, and understand all associated package inserts and contact our office with any questions or concerns. The patient accepts the risks of the treatment plan and had an opportunity to ask questions.   Staying healthy and adopting a healthy lifestyle for  your overall health is  important. You should eat 7 or more servings of fruits and vegetables per day. You should drink plenty of water to keep yourself hydrated and your kidneys healthy. This includes about 65-80+ fluid ounces of water. Limit your intake of animal fats especially for elevated cholesterol. Avoid highly processed food and limit your salt intake if you have hypertension. Avoid foods high in saturated/Trans fats. Along with a healthy diet it is also very important to maintain time for yourself to maintain a healthy mental health with low stress levels. You should get atleast 150 min of moderate intensity exercise weekly for a healthy heart. Along with eating right and exercising, aim for at least 7-9 hours of sleep daily.  Eat more whole grains which includes barley, wheat berries, oats, brown rice and whole wheat pasta. Use healthy plant oils which include olive, soy, corn, sunflower and peanut. Limit your caffeine and sugary drinks. Limit your intake of fast foods. Limit milk and dairy products to one or two daily servings.   Patient was given opportunity to ask questions. Patient verbalized understanding of the plan and was able to repeat key elements of the plan. All questions were answered to their satisfaction.  Raman Greidys Deland, DNP   I, Raman Tsuruko Murtha have reviewed all documentation for this visit. The documentation on 01/12/21 for the exam, diagnosis, procedures, and orders are all accurate and complete.   THE PATIENT IS ENCOURAGED TO PRACTICE SOCIAL DISTANCING DUE TO THE COVID-19 PANDEMIC.

## 2021-01-13 LAB — LIPID PANEL
Chol/HDL Ratio: 2.8 ratio (ref 0.0–4.4)
Cholesterol, Total: 176 mg/dL (ref 100–199)
HDL: 63 mg/dL (ref >39)
LDL Chol Calc (NIH): 93 mg/dL (ref 0–99)
Triglycerides: 114 mg/dL (ref 0–149)
VLDL Cholesterol Cal: 20 mg/dL (ref 5–40)

## 2021-01-13 LAB — CMP14+EGFR
ALT: 12 IU/L (ref 0–32)
AST: 17 IU/L (ref 0–40)
Albumin/Globulin Ratio: 1.4 (ref 1.2–2.2)
Albumin: 4.4 g/dL (ref 3.8–4.8)
Alkaline Phosphatase: 90 IU/L (ref 44–121)
BUN/Creatinine Ratio: 22 (ref 12–28)
BUN: 30 mg/dL — ABNORMAL HIGH (ref 8–27)
Bilirubin Total: 0.6 mg/dL (ref 0.0–1.2)
CO2: 21 mmol/L (ref 20–29)
Calcium: 9.7 mg/dL (ref 8.7–10.3)
Chloride: 100 mmol/L (ref 96–106)
Creatinine, Ser: 1.34 mg/dL — ABNORMAL HIGH (ref 0.57–1.00)
Globulin, Total: 3.2 g/dL (ref 1.5–4.5)
Glucose: 85 mg/dL (ref 70–99)
Potassium: 4.4 mmol/L (ref 3.5–5.2)
Sodium: 137 mmol/L (ref 134–144)
Total Protein: 7.6 g/dL (ref 6.0–8.5)
eGFR: 43 mL/min/{1.73_m2} — ABNORMAL LOW (ref >59)

## 2021-01-13 LAB — VITAMIN D 25 HYDROXY (VIT D DEFICIENCY, FRACTURES): Vit D, 25-Hydroxy: 42.7 ng/mL (ref 30.0–100.0)

## 2021-01-13 LAB — CBC
Hematocrit: 36.5 % (ref 34.0–46.6)
Hemoglobin: 12.1 g/dL (ref 11.1–15.9)
MCH: 28.1 pg (ref 26.6–33.0)
MCHC: 33.2 g/dL (ref 31.5–35.7)
MCV: 85 fL (ref 79–97)
Platelets: 424 10*3/uL (ref 150–450)
RBC: 4.3 x10E6/uL (ref 3.77–5.28)
RDW: 12.2 % (ref 11.7–15.4)
WBC: 10.3 10*3/uL (ref 3.4–10.8)

## 2021-01-13 LAB — HEMOGLOBIN A1C
Est. average glucose Bld gHb Est-mCnc: 123 mg/dL
Hgb A1c MFr Bld: 5.9 % — ABNORMAL HIGH (ref 4.8–5.6)

## 2021-01-13 LAB — T4, FREE: Free T4: 1.9 ng/dL — ABNORMAL HIGH (ref 0.82–1.77)

## 2021-01-13 LAB — TSH: TSH: 1.87 u[IU]/mL (ref 0.450–4.500)

## 2021-01-15 LAB — URINE CULTURE

## 2021-01-26 ENCOUNTER — Other Ambulatory Visit (HOSPITAL_COMMUNITY): Payer: Self-pay

## 2021-02-16 ENCOUNTER — Other Ambulatory Visit (HOSPITAL_COMMUNITY): Payer: Self-pay

## 2021-02-16 ENCOUNTER — Other Ambulatory Visit: Payer: Self-pay | Admitting: Internal Medicine

## 2021-02-16 MED ORDER — LISINOPRIL 20 MG PO TABS
20.0000 mg | ORAL_TABLET | Freq: Every day | ORAL | 0 refills | Status: DC
  Filled 2021-02-16: qty 90, 90d supply, fill #0

## 2021-02-19 NOTE — Progress Notes (Addendum)
Cardiology Office Note:    Date:  02/21/2021   ID:  Erin, [name] [birth year], MRN [id number]  PCP:  [name], MD  Cardiologist:  None   Referring MD: Bary Castilla, NP   Chief Complaint  Patient presents with   Cardiac Valve Problem   Advice Only    Abnormal EKG    History of Present Illness:    [name] is a 66 y.o. female with a hx of recently discovered abnormal ECG referred for opinion.   Concurrent medical problems are hypertension, hyperlipidemia, obesity, PAD, and DM II.   She has no complaints and is being seen because the PA who saw her in Dr. Baird [facility] office was concerned about the EKG interpretation which raise a question of a prior anterior wall MI.  I reviewed her EKGs back to 2018 and they have all shown the same pattern.  Has no history of systolic or any type heart murmur.  Denies orthopnea, PND, chest pain, edema, and syncope.  Past Medical History:  Diagnosis Date   Arthritis    DOE (dyspnea on exertion)    mild   Hyperlipidemia    Hypertension    Hypothyroidism    Morbid obesity ([place])    Pre-diabetes    Schamberg disease     Past Surgical History:  Procedure Laterality Date   BOWEL RESECTION  07/10/2016   Procedure: SMALL BOWEL RESECTION repair of small bowel x 2;  Surgeon: Fanny Skates, MD;  Location: [facility];  Service: General;;   COLON RESECTION SIGMOID  07/10/2016   Procedure: sigmoid colon resection with colostomy ;  Surgeon: Fanny Skates, MD;  Location: [facility];  Service: General;;   COLOSTOMY TAKEDOWN N/A 02/06/2017   Procedure: LAPAROSCOPIC CONVERTED OPEN COLOSTOMY REVISION WITH SMALL BOWEL RESECTION;  Surgeon: Leighton Ruff, MD;  Location: [facility];  Service: General;  Laterality: N/A;   FLEXIBLE SIGMOIDOSCOPY N/A 07/06/2016   Procedure: Beryle Quant;  Surgeon: Carol Ada, MD;  Location: [facility];  Service: Endoscopy;  Laterality: N/A;   LAPAROSCOPY N/A 07/10/2016   Procedure: LAPAROSCOPY  converted to open;  Surgeon: Fanny Skates, MD;  Location: [facility];  Service: General;  Laterality: N/A;   TONSILLECTOMY     age 30 yrs    Current Medications: Current Meds  Medication Sig   amLODipine (NORVASC) 5 MG tablet Take 1 tablet (5 mg total) by mouth daily.   Calcium Carbonate-Vitamin D 600-400 MG-UNIT tablet Take 1 tablet by mouth daily.   Cholecalciferol (VITAMIN D) 2000 units tablet Take 4,000 Units by mouth daily.   levothyroxine (SYNTHROID) 112 MCG tablet Take 1 tablet (112 mcg total) by mouth daily.   lisinopril (ZESTRIL) 20 MG tablet Take 1 tablet (20 mg total) by mouth daily.   Magnesium 400 MG TABS Take by mouth.   Multiple Vitamins-Minerals (CENTRUM SILVER 50+WOMEN PO) Take by mouth. 1 per day   potassium chloride SA (KLOR-CON) 20 MEQ tablet Take 1 tablet (20 mEq total) by mouth daily as needed (when taking Torsemide).   simvastatin (ZOCOR) 20 MG tablet Take 1 tablet (20 mg total) by mouth daily.   torsemide (DEMADEX) 20 MG tablet Take 1 tablet (20 mg total) by mouth daily as needed (for edema).     Allergies:   Patient has no known allergies.   Social History   Socioeconomic History   Marital status: Single    Spouse name: Not on file   Number of children: Not on file   Years of education:  Not on file   Highest education level: Not on file  Occupational History   Not on file  Tobacco Use   Smoking status: Never   Smokeless tobacco: Never  Vaping Use   Vaping Use: Never used  Substance and Sexual Activity   Alcohol use: No   Drug use: No   Sexual activity: Not on file  Other Topics Concern   Not on file  Social History Narrative   Not on file   Social Determinants of Health   Financial Resource Strain: Not on file  Food Insecurity: Not on file  Transportation Needs: Not on file  Physical Activity: Not on file  Stress: Not on file  Social Connections: Not on file     Family History: The patient's family history includes AAA (abdominal aortic  aneurysm) in her maternal grandfather; Alzheimer's disease in her maternal grandmother; Heart Problems in her mother; Heart attack in her father and maternal uncle; Heart attack (age of onset: 59) in her maternal grandfather; Hypertension in her mother; Stroke in her paternal aunt.  ROS:   Please see the history of present illness.    Left lower extremity edema with venous stasis ulcer.  All other systems reviewed and are negative.  EKGs/Labs/Other Studies Reviewed:    The following studies were reviewed today: No cardiac imaging  EKG:  EKG normal sinus rhythm with poor R wave progression V1 through V4.  This raises question of old anterior infarct, but pattern is unchanged compared EKGs all the way back to 2018.  Recent Labs: 01/12/2021: ALT 12; BUN 30; Creatinine, Ser 1.34; Hemoglobin 12.1; Platelets 424; Potassium 4.4; Sodium 137; TSH 1.870  Recent Lipid Panel    Component Value Date/Time   CHOL 176 01/12/2021 1109   TRIG 114 01/12/2021 1109   HDL 63 01/12/2021 1109   CHOLHDL 2.8 01/12/2021 1109   LDLCALC 93 01/12/2021 1109    Physical Exam:    VS:  BP 130/70    Pulse (!) 103    Ht 5' 0.6" (1.539 m)    Wt 227 lb (103 kg)    SpO2 100%    BMI 43.46 kg/m     Wt Readings from Last 3 Encounters:  02/21/21 227 lb (103 kg)  01/12/21 225 lb 12.8 oz (102.4 kg)  05/03/20 227 lb 3.2 oz (103.1 kg)     GEN: Morbid obesity. No acute distress HEENT: Normal NECK: No JVD. LYMPHATICS: No lymphadenopathy CARDIAC: 2/6 to 3/6 crescendo decrescendo right upper sternal systolic murmur that increases in intensity to the sub clavicular space bilaterally and radiates slightly into both carotids.  Maneuvers and not performed.  Murmur. RRR no gallop, or edema. VASCULAR:  Normal Pulses. No bruits. RESPIRATORY:  Clear to auscultation without rales, wheezing or rhonchi  ABDOMEN: Soft, non-tender, non-distended, No pulsatile mass, MUSCULOSKELETAL: No deformity  SKIN: Warm and dry NEUROLOGIC:  Alert  and oriented x 3 PSYCHIATRIC:  Normal affect   ASSESSMENT:    1. Nonspecific abnormal electrocardiogram (ECG) (EKG)   2. Systolic murmur   3. Hyperlipidemia, unspecified hyperlipidemia type   4. Primary hypertension   5. Morbid obesity ([place])   6. Pre-diabetes    PLAN:    In order of problems listed above:  I believe the EKG is stable and does not represent an infarction.  Could be related to lead position.  Is unchanged over the last 6 years. Rule out aortic stenosis vs LV outflow tract dynamic obstruction. Consider management based upon risk profile.  Continue  Zocor. Target 130/80 mmHg.  Continue Zestril and Demadex. Did not discuss other than weight loss encouragement Exercise and weight loss were encouraged.   Medication Adjustments/Labs and Tests Ordered: Current medicines are reviewed at length with the patient today.  Concerns regarding medicines are outlined above.  Orders Placed This Encounter  Procedures   ECHOCARDIOGRAM COMPLETE   No orders of the defined types were placed in this encounter.   Patient Instructions  Medication Instructions:  Your physician recommends that you continue on your current medications as directed. Please refer to the Current Medication list given to you today.  *If you need a refill on your cardiac medications before your next appointment, please call your pharmacy*   Lab Work: None today If you have labs (blood work) drawn today and your tests are completely normal, you will receive your results only by: [place] (if you have MyChart) OR A paper copy in the mail If you have any lab test that is abnormal or we need to change your treatment, we will call you to review the results.   Testing/Procedures: Your physician has requested that you have an echocardiogram. Echocardiography is a painless test that uses sound waves to create images of your heart. It provides your doctor with information about the size and shape of your  heart and how well your hearts chambers and valves are working. This procedure takes approximately one hour. There are no restrictions for this procedure.    Follow-Up: At [facility], you and your health needs are our priority.  As part of our continuing mission to provide you with exceptional heart care, we have created designated Provider Care Teams.  These Care Teams include your primary Cardiologist (physician) and Advanced Practice Providers (APPs -  Physician Assistants and Nurse Practitioners) who all work together to provide you with the care you need, when you need it.  We recommend signing up for the patient portal called "MyChart".  Sign up information is provided on this After Visit Summary.  MyChart is used to connect with patients for Virtual Visits (Telemedicine).  Patients are able to view lab/test results, encounter notes, upcoming appointments, etc.  Non-urgent messages can be sent to your provider as well.   To learn more about what you can do with MyChart, go to [email or web address].    Your next appointment:   Based on results.    Signed, Sinclair Grooms, MD  02/21/2021 4:36 PM    [facility]

## 2021-02-21 ENCOUNTER — Encounter (HOSPITAL_BASED_OUTPATIENT_CLINIC_OR_DEPARTMENT_OTHER): Payer: 59 | Attending: Internal Medicine | Admitting: Internal Medicine

## 2021-02-21 ENCOUNTER — Ambulatory Visit: Payer: 59 | Admitting: Interventional Cardiology

## 2021-02-21 ENCOUNTER — Other Ambulatory Visit: Payer: Self-pay

## 2021-02-21 ENCOUNTER — Encounter: Payer: Self-pay | Admitting: Interventional Cardiology

## 2021-02-21 VITALS — BP 130/70 | HR 103 | Ht 60.6 in | Wt 227.0 lb

## 2021-02-21 DIAGNOSIS — E785 Hyperlipidemia, unspecified: Secondary | ICD-10-CM

## 2021-02-21 DIAGNOSIS — I87332 Chronic venous hypertension (idiopathic) with ulcer and inflammation of left lower extremity: Secondary | ICD-10-CM | POA: Diagnosis not present

## 2021-02-21 DIAGNOSIS — I10 Essential (primary) hypertension: Secondary | ICD-10-CM | POA: Diagnosis not present

## 2021-02-21 DIAGNOSIS — L03116 Cellulitis of left lower limb: Secondary | ICD-10-CM | POA: Insufficient documentation

## 2021-02-21 DIAGNOSIS — E669 Obesity, unspecified: Secondary | ICD-10-CM | POA: Insufficient documentation

## 2021-02-21 DIAGNOSIS — I739 Peripheral vascular disease, unspecified: Secondary | ICD-10-CM | POA: Diagnosis not present

## 2021-02-21 DIAGNOSIS — R7303 Prediabetes: Secondary | ICD-10-CM

## 2021-02-21 DIAGNOSIS — R9431 Abnormal electrocardiogram [ECG] [EKG]: Secondary | ICD-10-CM | POA: Diagnosis not present

## 2021-02-21 DIAGNOSIS — L97828 Non-pressure chronic ulcer of other part of left lower leg with other specified severity: Secondary | ICD-10-CM | POA: Diagnosis not present

## 2021-02-21 DIAGNOSIS — L97222 Non-pressure chronic ulcer of left calf with fat layer exposed: Secondary | ICD-10-CM | POA: Diagnosis not present

## 2021-02-21 DIAGNOSIS — R011 Cardiac murmur, unspecified: Secondary | ICD-10-CM | POA: Diagnosis not present

## 2021-02-21 DIAGNOSIS — R600 Localized edema: Secondary | ICD-10-CM | POA: Insufficient documentation

## 2021-02-21 DIAGNOSIS — L97822 Non-pressure chronic ulcer of other part of left lower leg with fat layer exposed: Secondary | ICD-10-CM | POA: Diagnosis not present

## 2021-02-21 NOTE — Progress Notes (Signed)
VONZELLA, ALTHAUS ([id number]) Visit Report for 02/21/2021 Abuse/Suicide Risk Screen Details Patient Name: Date of Service: [name], [name] 02/21/2021 9:00 A M Medical Record Number: [id number] Patient Account Number: [id number] Date of Birth/Sex: Treating RN: 1955-11-02 (66 y.o. [name] Primary Care Lyrica Mcclarty: [name] Other Clinician: Referring Zaya Kessenich: Treating Daylani Deblois/Extender: Milus Banister in Treatment: 0 Abuse/Suicide Risk Screen Items Answer ABUSE RISK SCREEN: Has anyone close to you tried to hurt or harm you recentlyo No Do you feel uncomfortable with anyone in your familyo No Has anyone forced you do things that you didnt want to doo No Electronic Signature(s) Signed: 02/21/2021 5:00:37 PM By: [name] RN, BSN Entered By: [name] on 02/21/2021 09:40:32 -------------------------------------------------------------------------------- Activities of Daily Living Details Patient Name: Date of Service: [name], [name] 02/21/2021 9:00 A M Medical Record Number: [id number] Patient Account Number: [id number] Date of Birth/Sex: Treating RN: 1956/01/21 (66 y.o. [name] Primary Care Nyshaun Standage: [name] Other Clinician: Referring Gregoire Bennis: Treating Ambra Haverstick/Extender: Milus Banister in Treatment: 0 Activities of Daily Living Items Answer Activities of Daily Living (Please select one for each item) Drive Automobile Completely Able T Medications ake Completely Able Use T elephone Completely Able Care for Appearance Completely Able Use T oilet Completely Able Bath / Shower Completely Able Dress Self Completely Able Feed Self Completely Able Walk Completely Able Get In / Out Bed Completely Able Housework Completely Able Prepare Meals Completely [place] for Self Completely Able Electronic Signature(s) Signed: 02/21/2021 5:00:37 PM By: [name] RN,  BSN Entered By: [name] on 02/21/2021 09:41:28 -------------------------------------------------------------------------------- Education Screening Details Patient Name: Date of Service: Erin Gerold [name] 02/21/2021 9:00 A M Medical Record Number: [id number] Patient Account Number: [id number] Date of Birth/Sex: Treating RN: 1955/11/21 (66 y.o. [name] Primary Care Jontrell Bushong: [name] Other Clinician: Referring Nthony Lefferts: Treating Maida Widger/Extender: Milus Banister in Treatment: 0 Primary Learner Assessed: Patient Learning Preferences/Education Level/Primary Language Learning Preference: Explanation, Demonstration, Printed Material Highest Education Level: College or Above Preferred Language: English Cognitive Barrier Language Barrier: No Translator Needed: No Memory Deficit: No Emotional Barrier: No Cultural/Religious Beliefs Affecting Medical Care: No Physical Barrier Impaired Vision: No Impaired Hearing: No Decreased Hand dexterity: No Knowledge/Comprehension Knowledge Level: High Comprehension Level: High Ability to understand written instructions: High Ability to understand verbal instructions: High Motivation Anxiety Level: Calm Cooperation: Cooperative Education Importance: Acknowledges Need Interest in Health Problems: Asks Questions Perception: Coherent Willingness to Engage in Self-Management High Activities: Readiness to Engage in Self-Management High Activities: Electronic Signature(s) Signed: 02/21/2021 5:00:37 PM By: [name] RN, BSN Entered By: [name] on 02/21/2021 09:41:54 -------------------------------------------------------------------------------- Fall Risk Assessment Details Patient Name: Date of Service: Erin Gerold [name] 02/21/2021 9:00 A M Medical Record Number: [id number] Patient Account Number: [id number] Date of Birth/Sex: Treating RN: [birth year] (66 y.o. [name] Primary Care Kayelynn Abdou: [name] Other Clinician: Referring Marly Schuld: Treating Korby Ratay/Extender: Milus Banister in Treatment: 0 Fall Risk Assessment Items Have you had 2 or more falls in the last 12 monthso 0 No Have you had any fall that resulted in injury in the last 12 monthso 0 No FALLS RISK SCREEN History of falling - immediate or within 3 months 0 No Secondary diagnosis (Do you have 2 or more medical diagnoseso) 15 Yes Ambulatory aid None/bed rest/wheelchair/nurse 0 No Crutches/cane/walker 15 Yes Furniture 0 No Intravenous therapy Access/Saline/Heparin Lock 0 No Gait/Transferring Normal/ bed rest/ wheelchair 0  Yes Weak (short steps with or without shuffle, stooped but able to lift head while walking, may seek 0 No support from furniture) Impaired (short steps with shuffle, may have difficulty arising from chair, head down, impaired 0 No balance) Mental Status Oriented to own ability 0 Yes Electronic Signature(s) Signed: 02/21/2021 5:00:37 PM By: [name] RN, BSN Entered By: [name] on 02/21/2021 09:42:07 -------------------------------------------------------------------------------- Foot Assessment Details Patient Name: Date of Service: Erin Gerold [name] 02/21/2021 9:00 A M Medical Record Number: [id number] Patient Account Number: [id number] Date of Birth/Sex: Treating RN: 09-Apr-1955 (66 y.o. [name] Primary Care Leonie Amacher: [name] Other Clinician: Referring Romero Letizia: Treating Shaneca Orne/Extender: Milus Banister in Treatment: 0 Foot Assessment Items Site Locations + = Sensation present, - = Sensation absent, C = Callus, U = Ulcer R = Redness, W = Warmth, M = Maceration, PU = Pre-ulcerative lesion F = Fissure, S = Swelling, D = Dryness Assessment Right: Left: Other Deformity: No No Prior Foot Ulcer: No No Prior Amputation: No No Charcot Joint: No No Ambulatory Status:  Ambulatory With Help Assistance Device: Cane Gait: Steady Electronic Signature(s) Signed: 02/21/2021 5:00:37 PM By: [name] RN, BSN Entered By: [name] on 02/21/2021 09:42:53 -------------------------------------------------------------------------------- Nutrition Risk Screening Details Patient Name: Date of Service: Erin Face D. 02/21/2021 9:00 A M Medical Record Number: [id number] Patient Account Number: [id number] Date of Birth/Sex: Treating RN: June 04, 1955 (66 y.o. [name] Primary Care Yeiren [place]: [name] Other Clinician: Referring Jadriel Saxer: Treating Sabreena Vogan/Extender: Milus Banister in Treatment: 0 Height (in): Weight (lbs): Body Mass Index (BMI): Nutrition Risk Screening Items Score Screening NUTRITION RISK SCREEN: I have an illness or condition that made me change the kind and/or amount of food I eat 0 No I eat fewer than two meals per day 0 No I eat few fruits and vegetables, or milk products 0 No I have three or more drinks of beer, liquor or wine almost every day 0 No I have tooth or mouth problems that make it hard for me to eat 0 No I don't always have enough money to buy the food I need 0 No I eat alone most of the time 0 No I take three or more different prescribed or over-the-counter drugs a day 1 Yes Without wanting to, I have lost or gained 10 pounds in the last six months 0 No I am not always physically able to shop, cook and/or feed myself 0 No Nutrition Protocols Good Risk Protocol Moderate Risk Protocol 0 Provide education on nutrition High Risk Proctocol Risk Level: Good Risk Score: 1 Electronic Signature(s) Signed: 02/21/2021 5:00:37 PM By: [name] RN, BSN Entered By: [name] on 02/21/2021 09:42:19

## 2021-02-21 NOTE — Progress Notes (Signed)
[name], [name] ([id number]) Visit Report for 02/21/2021 Chief Complaint Document Details Patient Name: Date of Service: [name], [name] 02/21/2021 9:00 A M Medical Record Number: [id number] Patient Account Number: [id number] Date of Birth/Sex: Treating RN: 1955-07-02 (66 y.o. [name] Primary Care Provider: [name] Other Clinician: Referring Provider: Treating Provider/Extender: Norval Gable, Murvin Natal in Treatment: 0 Information Obtained from: Patient Chief Complaint 12/10/2017; the patient is here for review of wounds on her left distal lower calf. 02/21/2021 patient here for review of wounds on her left lateral and left posterior calf Electronic Signature(s) Signed: 02/21/2021 4:28:08 PM By: Linton Ham MD Entered By: Linton Ham on 02/21/2021 10:22:17 -------------------------------------------------------------------------------- HPI Details Patient Name: Date of Service: Erin Gerold [name] 02/21/2021 9:00 A M Medical Record Number: [id number] Patient Account Number: [id number] Date of Birth/Sex: Treating RN: 23-May-1955 (66 y.o. 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 She tells [name] that she fell on the gravel driveway in July. She developed injuries on her distal lower leg which have not healed. She saw her primary physician on 11/15/2017 who noted her left shin injuries. Gave her antibiotics. At that point the wounds were almost circumferential however most were less than 1.5 cm. Weeping edema fluid was noted. She was referred here for evaluation. The patient has a history of chronic lower extremity edema. 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 She has had prior history with leg weeping fluid. She does not wear compression stockings. She is not doing anything specific to these wound areas. 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 She also has lymphedema. Put her in 3 layer compression and silver alginate last week. She has 3 small wounds with depth just lateral to the tibia. More problematically than this she has numerous shallow areas some of which are almost canal like in shape with tightly adherent painful debris. It would be very difficult and time- consuming to go through this and attempt to individually debride all these areas.. I changed her to collagen today to see if that would help with any of the surface debris on some of these wounds. Otherwise we will not be able to put this in compression we have to have someone change the dressing. The patient is not eligible for home health 12/25/17 on evaluation today patient actually appears to be doing rather well in regard to the ulcer on her lower extremity. Fortunately there does not appear to be evidence of infection at this time. She has been tolerating the dressing changes without complication. This includes the compression wrap. The only issue she had was that the wrap was initially placed over her bunion region which actually calls her some discomfort and pain. Other than that things seem to be going rather well. 01/01/2018 Seen today for follow-up and management of left lower extremity wound and lymphedema. T oday she presents with a new wound towards to the left lateral LE. Recently treated with  a 7 day course  of amoxicillin; reason for antibiotic dose is unknown at this time. Tolerating current treatment of collagen with 4- layer wraps. She obtained a venous reflux study on 12/25/17. Studies show on the right abnormal reflux times of the popliteal vein, great saphenous vein at the saphenofemoral junction at the proximal thigh, great saphenous vein at the mid calf, and origin of the small saphenous vein.No superficial thrombosis. No deep vein thrombosis in the common femoral, femoral,and popliteal veins. Left abnormal reflex times as well of the common femoral vein, popliteal vein, and a great saphenous vein at the saphenofemoral junction, In great saphenous vein at the mid thigh w/o thrombosis. Has any issues or concerns during visit today. Recommended follow-up to vascular specialist due to abnormalities from the venous reflux study. Denies fever, pain, chills, dizziness, nausea, or vomiting. 01/08/18 upon evaluation today the patient actually seems to be showing some signs of improvement in my opinion at this point in regard to the lower extremity ulcerated areas. She still has a lot of drainage but fortunately nothing that appears to be too significant currently. I have been very happy with the overall progress I see today compared to where things were during the last evaluation that I had with her. Nonetheless she has not had her appointment with the vein specialist as of yet in fact we were able to get this approved for her today and confirmed with them she will be seeing them on December 26. Nonetheless in general I do feel like the compression wraps is doing well for her. 01/17/18; quite a bit of improvement since last time I saw this patient she has a small open area remaining on the left lateral calf and even smaller area medially. She has lymphedema chronic stasis changes with distal skin fibrosis. She has an appointment with vascular surgery later this month 01/24/2018; the patient's medial leg  has closed. Still a small open area on the left lateral leg. She states that the 4 layer compression we put on last week was too tight and she had to take it off over a few days ago. We have had resultant increase in her lymphedema in the dorsal foot and a proximal calf. Fortunately that does not seem to have resulted in any deterioration in her wounds The patient is going to need compression stockings. We have given her measurements to phone elastic therapy in . She has vascular surgery consult on December 26 02/07/18; she's had continuous contraction on the left lateral leg wound which is now very small. Currently using silver alginate under 3 layer compression She saw Dr. Trula Slade of vascular surgery on 02/06/18. It was noted that she had a normal reflux times in the popliteal vein, great saphenous vein at the saphenofemoral junction, great saphenous vein at the proximal thigh great saphenous vein at the mid calf and origin of the small saphenous vein. It was noted that she had significant reflux in the left saphenous veins with diameter measurements in the 0.7-0.8 cm range. It was felt she would benefit from laser ablation to help minimize the risk of ulcer recurrence. It was recommended that she wear 20-30 thigh-high compression stockings and have follow-up in 4-6 weeks 02/17/2018; I thought this lady would be healed however her compression slipped down and she developed increasing swelling and the wound is actually larger. 1/13; we had deterioration last week after the patient's compression slipped down and she developed periwound swelling. I increased her compression before layers we have been  using silver alginate we are a lot better again today. She has her compression stockings in waiting 1/23; the patient's wounds are totally healed today. She has her stockings. This was almost circumferential skin damage. She follows up with Dr. Trula Slade of vascular surgery next  Monday. READMISSION 02/21/2021 This is a now 66 year old woman that we had in clinic here discharging in January 2020 with wounds on her left calf chronic venous insufficiency. She was discharged with 30/40 stockings. It does not sound like she has worn stockings in about a year largely from not being able to get them on herself. In November she developed new blisters on her legs an area laterally is opened into a fairly sizable wound. She has weeping posteriorly as well. She has been using Neosporin and Band-Aids. The patient did see Dr. Trula Slade in 2019 and 2020. He felt she might benefit from laser ablation in her left saphenous veins although because of the wound that healed I do not think he went through with it. She might benefit from seeing him again. Her ABI on the left is 1.2 Electronic Signature(s) Signed: 02/21/2021 4:28:08 PM By: Linton Ham MD Entered By: Linton Ham on 02/21/2021 10:24:20 -------------------------------------------------------------------------------- Physical Exam Details Patient Name: Date of Service: Erin Gerold [name] 02/21/2021 9:00 A M Medical Record Number: [id number] Patient Account Number: [id number] Date of Birth/Sex: Treating RN: 01-21-1956 (66 y.o. [name] Primary Care Provider: [name] Other Clinician: Referring Provider: Treating Provider/Extender: Milus Banister in Treatment: 0 Constitutional Sitting or standing Blood Pressure is within target range for patient.. Pulse regular and within target range for patient.Marland Kitchen Respirations regular, non-labored and within target range.. Temperature is normal and within the target range for the patient.Marland Kitchen Appears in no distress. Cardiovascular Pedal pulses are palpable on the left. Notes Wound exam; the patient has a large wound on the left lateral calf mid tibia. Completely necrotic surface although there is far too much swelling here to consider aggressive  debridement today. Posteriorly at the same level she has weeping edema. Distally her skin is very adherent and erythematous but I think all of this is chronic stasis dermatitis and lipodermatosclerosis. There is no tenderness Electronic Signature(s) Signed: 02/21/2021 4:28:08 PM By: Linton Ham MD Entered By: Linton Ham on 02/21/2021 10:25:48 -------------------------------------------------------------------------------- Physician Orders Details Patient Name: Date of Service: Erin Gerold [name] 02/21/2021 9:00 A M Medical Record Number: [id number] Patient Account Number: [id number] Date of Birth/Sex: Treating RN: [birth year] (66 y.o. [name] Primary Care Provider: [name] Other Clinician: Referring Provider: Treating Provider/Extender: Milus Banister in Treatment: 0 Verbal / Phone Orders: No Diagnosis Coding Follow-up Appointments ppointment in 1 week. - Dr. Dellia Nims Return A Bathing/ Shower/ Hygiene May shower with protection but do not get wound dressing(s) wet. - Ok to use [employer], can purchase at [facility], [facility], or [employer] Edema Control - Lymphedema / SCD / Other Elevate legs to the level of the heart or above for 30 minutes daily and/or when sitting, a frequency of: - throughout the day Avoid standing for long periods of time. Exercise regularly Wound Treatment Wound #3 - Lower Leg Wound Laterality: Left, Lateral Cleanser: Soap and Water 1 x Per Week Discharge Instructions: May shower and wash wound with dial antibacterial soap and water prior to dressing change. Cleanser: Wound Cleanser 1 x Per Week Discharge Instructions: Cleanse the wound with wound cleanser prior to applying a clean dressing using gauze sponges, not tissue or cotton balls. Peri-Wound  Care: Triamcinolone 15 (g) 1 x Per Week Discharge Instructions: Use triamcinolone 15 (g) as directed Peri-Wound Care: Zinc Oxide Ointment 30g tube 1 x Per Week Discharge  Instructions: Apply Zinc Oxide to periwound with each dressing change Peri-Wound Care: Sween Lotion (Moisturizing lotion) 1 x Per Week Discharge Instructions: Apply moisturizing lotion as directed Prim Dressing: IODOFLEX 0.9% Cadexomer Iodine Pad 4x6 cm 1 x Per Week ary Discharge Instructions: Apply to wound bed as instructed Secondary Dressing: ABD Pad, 8x10 1 x Per Week Discharge Instructions: Apply over primary dressing as directed. Secondary Dressing: Zetuvit Plus 4x8 in 1 x Per Week Discharge Instructions: Apply over primary dressing as directed. Compression Wrap: FourPress (4 layer compression wrap) 1 x Per Week Discharge Instructions: Apply four layer compression as directed. May also use Miliken CoFlex 2 layer compression system as alternative. Wound #4 - Lower Leg Wound Laterality: Left, Posterior Cleanser: Soap and Water 1 x Per Week Discharge Instructions: May shower and wash wound with dial antibacterial soap and water prior to dressing change. Cleanser: Wound Cleanser 1 x Per Week Discharge Instructions: Cleanse the wound with wound cleanser prior to applying a clean dressing using gauze sponges, not tissue or cotton balls. Peri-Wound Care: Triamcinolone 15 (g) 1 x Per Week Discharge Instructions: Use triamcinolone 15 (g) as directed Peri-Wound Care: Sween Lotion (Moisturizing lotion) 1 x Per Week Discharge Instructions: Apply moisturizing lotion as directed Prim Dressing: KerraCel Ag Gelling Fiber Dressing, 4x5 in (silver alginate) 1 x Per Week ary Discharge Instructions: Apply silver alginate to wound bed as instructed Secondary Dressing: ABD Pad, 8x10 1 x Per Week Discharge Instructions: Apply over primary dressing as directed. Secondary Dressing: Zetuvit Plus 4x8 in 1 x Per Week Discharge Instructions: Apply over primary dressing as directed. Compression Wrap: FourPress (4 layer compression wrap) 1 x Per Week Discharge Instructions: Apply four layer compression as  directed. May also use Miliken CoFlex 2 layer compression system as alternative. Electronic Signature(s) Signed: 02/21/2021 4:28:08 PM By: Linton Ham MD Signed: 02/21/2021 5:00:37 PM By: [name] RN, BSN Entered By: [name] on 02/21/2021 10:04:01 -------------------------------------------------------------------------------- Problem List Details Patient Name: Date of Service: Erin Gerold [name] 02/21/2021 9:00 A M Medical Record Number: [id number] Patient Account Number: [id number] Date of Birth/Sex: Treating RN: [birth year] (66 y.o. [name] Primary Care Provider: [name] Other Clinician: Referring Provider: Treating Provider/Extender: Milus Banister in Treatment: 0 Active Problems ICD-10 Encounter Code Description Active Date MDM Diagnosis 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 (idiopathic) with ulcer and inflammation of left 02/21/2021 No Yes lower extremity L97.828 Non-pressure chronic ulcer of other part of left lower leg with other specified 02/21/2021 No Yes severity Inactive Problems Resolved Problems Electronic Signature(s) Signed: 02/21/2021 4:28:08 PM By: Linton Ham MD Entered By: Linton Ham on 02/21/2021 10:14:00 -------------------------------------------------------------------------------- Progress Note Details Patient Name: Date of Service: [name] 02/21/2021 9:00 A M Medical Record Number: [id number] Patient Account Number: [id number] Date of Birth/Sex: Treating RN: 1955/10/30 (66 y.o. [name] Primary Care Provider: [name] Other Clinician: Referring Provider: Treating Provider/Extender: Milus Banister in Treatment: 0 Subjective Chief Complaint Information obtained from Patient 12/10/2017; the patient is here for review of wounds on her left distal lower calf. 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 She tells [name] that she fell on the gravel  driveway in July. She developed injuries on her distal lower leg which have not healed. She saw her primary physician on 11/15/2017 who noted her left shin injuries. Gave her antibiotics. At that point the wounds were almost circumferential however most were less than 1.5 cm. Weeping edema fluid was noted. She was referred here for evaluation. The patient has a history of chronic lower extremity edema. 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 She has had prior history with leg weeping fluid. She does not wear compression stockings. She is not doing anything specific to these wound areas. 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 She also has lymphedema. Put her in 3 layer compression and silver alginate last week. She has 3 small wounds with depth just lateral to the tibia. More problematically than this she has numerous shallow areas some of which are almost canal like in shape with tightly adherent painful debris. It would be very difficult and time- consuming to go through this and attempt to individually debride all these areas.. I changed her to collagen today to see if that would help with any of the surface debris on some of these wounds. Otherwise we will not be able to put this in compression we have to have someone change the dressing. The patient is not eligible for home health 12/25/17 on evaluation today patient actually  appears to be doing rather well in regard to the ulcer on her lower extremity. Fortunately there does not appear to be evidence of infection at this time. She has been tolerating the dressing changes without complication. This includes the compression wrap. The only issue she had was that the wrap was initially placed over her bunion region which actually calls her some discomfort and pain. Other than that things seem to be going rather well. 01/01/2018 Seen today for follow-up and management of left lower extremity wound and lymphedema. T oday she presents with a new wound towards to the left lateral LE. Recently treated with a 7 day course of amoxicillin; reason for antibiotic dose is unknown at this time. Tolerating current treatment of collagen with 4- layer wraps. She obtained a venous reflux study on 12/25/17. Studies show on the right abnormal reflux times of the popliteal vein, great saphenous vein at the saphenofemoral junction at the proximal thigh, great saphenous vein at the mid calf, and origin of the small saphenous vein.No superficial thrombosis. No deep vein thrombosis in the common femoral, femoral,and popliteal veins. Left abnormal reflex times as well of the common femoral vein, popliteal vein, and a great saphenous vein at the saphenofemoral junction, In great saphenous vein at the mid thigh w/o thrombosis. Has any issues or concerns during visit today. Recommended follow-up to vascular specialist due to abnormalities from the venous reflux study. Denies fever, pain, chills, dizziness, nausea, or vomiting. 01/08/18 upon evaluation today the patient actually seems to be showing some signs of improvement in my opinion at this point in regard to the lower extremity ulcerated areas. She still has a lot of drainage but fortunately nothing that appears to be too significant currently. I have been very happy with the overall progress I see  today compared to where things were during the  last evaluation that I had with her. Nonetheless she has not had her appointment with the vein specialist as of yet in fact we were able to get this approved for her today and confirmed with them she will be seeing them on December 26. Nonetheless in general I do feel like the compression wraps is doing well for her. 01/17/18; quite a bit of improvement since last time I saw this patient she has a small open area remaining on the left lateral calf and even smaller area medially. She has lymphedema chronic stasis changes with distal skin fibrosis. She has an appointment with vascular surgery later this month 01/24/2018; the patient's medial leg has closed. Still a small open area on the left lateral leg. She states that the 4 layer compression we put on last week was too tight and she had to take it off over a few days ago. We have had resultant increase in her lymphedema in the dorsal foot and a proximal calf. Fortunately that does not seem to have resulted in any deterioration in her wounds The patient is going to need compression stockings. We have given her measurements to phone elastic therapy in [place]. She has vascular surgery consult on December 26 02/07/18; she's had continuous contraction on the left lateral leg wound which is now very small. Currently using silver alginate under 3 layer compression She saw Dr. Trula Slade of vascular surgery on 02/06/18. It was noted that she had a normal reflux times in the popliteal vein, great saphenous vein at the saphenofemoral junction, great saphenous vein at the proximal thigh great saphenous vein at the mid calf and origin of the small saphenous vein. It was noted that she had significant reflux in the left saphenous veins with diameter measurements in the 0.7-0.8 cm range. It was felt she would benefit from laser ablation to help minimize the risk of ulcer recurrence. It was recommended that she wear 20-30 thigh-high compression stockings and have  follow-up in 4-6 weeks 02/17/2018; I thought this lady would be healed however her compression slipped down and she developed increasing swelling and the wound is actually larger. 1/13; we had deterioration last week after the patient's compression slipped down and she developed periwound swelling. I increased her compression before layers we have been using silver alginate we are a lot better again today. She has her compression stockings in waiting 1/23; the patient's wounds are totally healed today. She has her stockings. This was almost circumferential skin damage. She follows up with Dr. Trula Slade of vascular surgery next Monday. READMISSION 02/21/2021 This is a now 66 year old woman that we had in clinic here discharging in January 2020 with wounds on her left calf chronic venous insufficiency. She was discharged with 30/40 stockings. It does not sound like she has worn stockings in about a year largely from not being able to get them on herself. In November she developed new blisters on her legs an area laterally is opened into a fairly sizable wound. She has weeping posteriorly as well. She has been using Neosporin and Band-Aids. The patient did see Dr. Trula Slade in 2019 and 2020. He felt she might benefit from laser ablation in her left saphenous veins although because of the wound that healed I do not think he went through with it. She might benefit from seeing him again. Her ABI on the left is 1.2 Patient History Information obtained from Patient. Allergies No Known Drug Allergies Family History  Heart Disease - Mother,Father, Hypertension - Mother,Father, Kidney Disease - Mother, Thyroid Problems - Mother, No family history of Cancer, Diabetes, Hereditary Spherocytosis, Lung Disease, Seizures, Stroke, Tuberculosis. Social History Never smoker, Marital Status - Single, Alcohol Use - Never, Drug Use - No History, Caffeine Use - Daily - coffee. Medical History Eyes Patient has history of  Cataracts Hematologic/Lymphatic Patient has history of Lymphedema Cardiovascular Patient has history of Hypertension, Peripheral Venous Disease Integumentary (Skin) Denies history of History of Burn Musculoskeletal Patient has history of Osteoarthritis Hospitalization/Surgery History - colostomy reversal. 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 Medical A Surgical History Notes nd Constitutional Symptoms (General Health) morbid obesity Cardiovascular schamberg disease, hyperlipidemia Gastrointestinal diverticulitis , h/o obstruction due to diverticulitis , colostomy and colostomy reversal Endocrine hypothyroidism Review of Systems (ROS) Constitutional Symptoms (General Health) Denies complaints or symptoms of Fatigue, Fever, Chills, Marked Weight Change. Ear/Nose/Mouth/Throat Denies complaints or symptoms of Chronic sinus problems or rhinitis. Respiratory Denies complaints or symptoms of Chronic or frequent coughs, Shortness of Breath. Genitourinary Denies complaints or symptoms of Frequent urination. Integumentary (Skin) Complains or has symptoms of Wounds - wounds on left lower leg. Neurologic Denies complaints or symptoms of Numbness/parasthesias. Psychiatric Denies complaints or symptoms of Claustrophobia, Suicidal. Objective Constitutional Sitting or standing Blood Pressure is within target range for patient.. Pulse regular and within target range for patient.Marland Kitchen Respirations regular, non-labored and within target range.. Temperature is normal and within the target range for the patient.Marland Kitchen Appears in no distress. Vitals Time Taken: 9:19 AM, Temperature: 98.2 F, Pulse: 99 bpm, Respiratory Rate: 18 breaths/min, Blood Pressure: 133/84 mmHg. Cardiovascular Pedal pulses are palpable on the left. General Notes: Wound exam; the patient has a large wound on the left lateral calf mid tibia. Completely necrotic surface although there is far too much swelling here to consider aggressive  debridement today. Posteriorly at the same level she has weeping edema. Distally her skin is very adherent and erythematous but I think all of this is chronic stasis dermatitis and lipodermatosclerosis. There is no tenderness Integumentary (Hair, Skin) Wound #3 status is Open. Original cause of wound was Blister. The date acquired was: 12/13/2020. The wound is located on the Left,Lateral Lower Leg. The wound measures 5cm length x 7.4cm width x 0.2cm depth; 29.06cm^2 area and 5.812cm^3 volume. There is Fat Layer (Subcutaneous Tissue) exposed. There is no tunneling or undermining noted. There is a medium amount of serosanguineous drainage noted. The wound margin is flat and intact. There is small (1-33%) pink granulation within the wound bed. There is a large (67-100%) amount of necrotic tissue within the wound bed including Adherent Slough. Wound #4 status is Open. Original cause of wound was Blister. The date acquired was: 12/13/2020. The wound is located on the Left,Posterior Lower Leg. The wound measures 6cm length x 5.5cm width x 0.1cm depth; 25.918cm^2 area and 2.592cm^3 volume. There is Fat Layer (Subcutaneous Tissue) exposed. There is no tunneling or undermining noted. There is a medium amount of serous drainage noted. The wound margin is flat and intact. There is large (67-100%) pink granulation within the wound bed. There is no necrotic tissue within the wound bed. Assessment Active Problems ICD-10 Chronic venous hypertension (idiopathic) with ulcer and inflammation of left lower extremity Non-pressure chronic ulcer of other part of left lower leg with other specified severity Procedures Wound #3 Pre-procedure diagnosis of Wound #3 is a Venous Leg Ulcer located on the Left,Lateral Lower Leg . There was a Four Layer Compression Therapy Procedure by [name], RN. Post  procedure Diagnosis Wound #3: Same as Pre-Procedure Wound #4 Pre-procedure diagnosis of Wound #4 is a Venous Leg Ulcer  located on the Left,Posterior Lower Leg . There was a Four Layer Compression Therapy Procedure by [name], RN. Post procedure Diagnosis Wound #4: Same as Pre-Procedure Plan Follow-up Appointments: Return Appointment in 1 week. - Dr. Dellia Nims Bathing/ Shower/ Hygiene: May shower with protection but do not get wound dressing(s) wet. - Ok to use [employer], can purchase at [facility], [facility], or [employer] Edema Control - Lymphedema / SCD / Other: Elevate legs to the level of the heart or above for 30 minutes daily and/or when sitting, a frequency of: - throughout the day Avoid standing for long periods of time. Exercise regularly WOUND #3: - Lower Leg Wound Laterality: Left, Lateral Cleanser: Soap and Water 1 x Per Week/ Discharge Instructions: May shower and wash wound with dial antibacterial soap and water prior to dressing change. Cleanser: Wound Cleanser 1 x Per Week/ Discharge Instructions: Cleanse the wound with wound cleanser prior to applying a clean dressing using gauze sponges, not tissue or cotton balls. Peri-Wound Care: Triamcinolone 15 (g) 1 x Per Week/ Discharge Instructions: Use triamcinolone 15 (g) as directed Peri-Wound Care: Zinc Oxide Ointment 30g tube 1 x Per Week/ Discharge Instructions: Apply Zinc Oxide to periwound with each dressing change Peri-Wound Care: Sween Lotion (Moisturizing lotion) 1 x Per Week/ Discharge Instructions: Apply moisturizing lotion as directed Prim Dressing: IODOFLEX 0.9% Cadexomer Iodine Pad 4x6 cm 1 x Per Week/ ary Discharge Instructions: Apply to wound bed as instructed Secondary Dressing: ABD Pad, 8x10 1 x Per Week/ Discharge Instructions: Apply over primary dressing as directed. Secondary Dressing: Zetuvit Plus 4x8 in 1 x Per Week/ Discharge Instructions: Apply over primary dressing as directed. Com pression Wrap: FourPress (4 layer compression wrap) 1 x Per Week/ Discharge Instructions: Apply four layer compression as directed. May  also use Miliken CoFlex 2 layer compression system as alternative. WOUND #4: - Lower Leg Wound Laterality: Left, Posterior Cleanser: Soap and Water 1 x Per Week/ Discharge Instructions: May shower and wash wound with dial antibacterial soap and water prior to dressing change. Cleanser: Wound Cleanser 1 x Per Week/ Discharge Instructions: Cleanse the wound with wound cleanser prior to applying a clean dressing using gauze sponges, not tissue or cotton balls. Peri-Wound Care: Triamcinolone 15 (g) 1 x Per Week/ Discharge Instructions: Use triamcinolone 15 (g) as directed Peri-Wound Care: Sween Lotion (Moisturizing lotion) 1 x Per Week/ Discharge Instructions: Apply moisturizing lotion as directed Prim Dressing: KerraCel Ag Gelling Fiber Dressing, 4x5 in (silver alginate) 1 x Per Week/ ary Discharge Instructions: Apply silver alginate to wound bed as instructed Secondary Dressing: ABD Pad, 8x10 1 x Per Week/ Discharge Instructions: Apply over primary dressing as directed. Secondary Dressing: Zetuvit Plus 4x8 in 1 x Per Week/ Discharge Instructions: Apply over primary dressing as directed. Com pression Wrap: FourPress (4 layer compression wrap) 1 x Per Week/ Discharge Instructions: Apply four layer compression as directed. May also use Miliken CoFlex 2 layer compression system as alternative. 1. I am going to use Iodoflex to her major wound on the lateral calf silver alginate posteriorly under 4-layer compression. 2. 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 3. I think it might be worth reconnecting her with Dr. Trula Slade to consider ablation. I will discuss this with her next time 4. Liberal TCA to the erythematous areas on the left mid calf which I think  is all stasis dermatitis. 5. As mentioned no debridement today there is simply too much edema fluid but I think she will need an aggressive debridement next week Electronic Signature(s) Signed:  02/21/2021 4:28:08 PM By: Linton Ham MD Entered By: Linton Ham on 02/21/2021 10:27:10 -------------------------------------------------------------------------------- [facility] Details Patient Name: Date of Service: Erin Gerold [name] 02/21/2021 9:00 A M Medical Record Number: [id number] Patient Account Number: [id number] Date of Birth/Sex: Treating RN: Dec 21, 1955 (66 y.o. [name] Primary Care Provider: [name] Other Clinician: Referring Provider: Treating Provider/Extender: Milus Banister in Treatment: 0 Information Obtained From Patient Constitutional Symptoms (General Health) Complaints and Symptoms: Negative for: Fatigue; Fever; Chills; Marked Weight Change Medical History: Past Medical History Notes: morbid obesity Ear/Nose/Mouth/Throat Complaints and Symptoms: Negative for: Chronic sinus problems or rhinitis Respiratory Complaints and Symptoms: Negative for: Chronic or frequent coughs; Shortness of Breath Genitourinary Complaints and Symptoms: Negative for: Frequent urination Integumentary (Skin) Complaints and Symptoms: Positive for: Wounds - wounds on left lower leg Medical History: Negative for: History of Burn Neurologic Complaints and Symptoms: Negative for: Numbness/parasthesias Psychiatric Complaints and Symptoms: Negative for: Claustrophobia; Suicidal Eyes Medical History: Positive for: Cataracts Hematologic/Lymphatic Medical History: Positive for: Lymphedema Cardiovascular Medical History: Positive for: Hypertension; Peripheral Venous Disease Past Medical History Notes: schamberg disease, hyperlipidemia Gastrointestinal Medical History: Past Medical History Notes: diverticulitis , h/o obstruction due to diverticulitis , colostomy and colostomy reversal Endocrine Medical History: Past Medical History Notes: hypothyroidism Immunological Musculoskeletal Medical History: Positive for:  Osteoarthritis Oncologic HBO Extended History Items Eyes: Cataracts Immunizations Pneumococcal Vaccine: Received Pneumococcal Vaccination: Yes Received Pneumococcal Vaccination On or After 60th Birthday: Yes Implantable Devices None Hospitalization / Surgery History Type of Hospitalization/Surgery colostomy reversal 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 Family and Social History Cancer: No; Diabetes: No; Heart Disease: Yes - Mother,Father; Hereditary Spherocytosis: No; Hypertension: Yes - Mother,Father; Kidney Disease: Yes - Mother; Lung Disease: No; Seizures: No; Stroke: No; Thyroid Problems: Yes - Mother; Tuberculosis: No; Never smoker; Marital Status - Single; Alcohol Use: Never; Drug Use: No History; Caffeine Use: Daily - coffee; Financial Concerns: No; Food, Clothing or Shelter Needs: No; Support System Lacking: No; Transportation Concerns: No Engineer, maintenance) Signed: 02/21/2021 4:28:08 PM By: Linton Ham MD Signed: 02/21/2021 5:00:37 PM By: [name] RN, BSN Entered By: [name] on 02/21/2021 09:40:25 -------------------------------------------------------------------------------- [place] Details Patient Name: Date of Service: [name] 02/21/2021 Medical Record Number: [id number] Patient Account Number: [id number] Date of Birth/Sex: Treating RN: 03/08/1955 (66 y.o. [name] Primary Care Provider: [name] Other Clinician: Referring Provider: Treating Provider/Extender: Milus Banister in Treatment: 0 Diagnosis Coding ICD-10 Codes Code Description [phone] Chronic venous hypertension (idiopathic) with ulcer and inflammation of left lower extremity L97.828 Non-pressure chronic ulcer of other part of left lower leg with other specified severity Facility Procedures CPT4 Code: 41324401 Description: 02725 - WOUND CARE VISIT-LEV 3 EST PT Modifier: 25 Quantity: 1 CPT4 Code: [id number] Description: (Facility Use  Only) 29581LT - [place] LWR LT LEG Modifier: Quantity: 1 Physician Procedures : CPT4 Code Description Modifier [id number] - WC PHYS LEVEL 4 - EST PT ICD-10 Diagnosis Description 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 (idiopathic) with ulcer and inflammation of left lower extremity L97.828 Non-pressure chronic ulcer of other part  of left lower leg with other specified severity Quantity: 1 Electronic Signature(s) Signed: 02/21/2021 4:28:08 PM By: Linton Ham MD Signed: 02/21/2021 5:00:37 PM By: [name] RN, BSN Entered By: [name] on 02/21/2021 11:56:40

## 2021-02-21 NOTE — Patient Instructions (Signed)
Medication Instructions:  Your physician recommends that you continue on your current medications as directed. Please refer to the Current Medication list given to you today.  *If you need a refill on your cardiac medications before your next appointment, please call your pharmacy*   Lab Work: None today If you have labs (blood work) drawn today and your tests are completely normal, you will receive your results only by: [place] (if you have MyChart) OR A paper copy in the mail If you have any lab test that is abnormal or we need to change your treatment, we will call you to review the results.   Testing/Procedures: Your physician has requested that you have an echocardiogram. Echocardiography is a painless test that uses sound waves to create images of your heart. It provides your doctor with information about the size and shape of your heart and how well your hearts chambers and valves are working. This procedure takes approximately one hour. There are no restrictions for this procedure.    Follow-Up: At [facility], you and your health needs are our priority.  As part of our continuing mission to provide you with exceptional heart care, we have created designated Provider Care Teams.  These Care Teams include your primary Cardiologist (physician) and Advanced Practice Providers (APPs -  Physician Assistants and Nurse Practitioners) who all work together to provide you with the care you need, when you need it.  We recommend signing up for the patient portal called "MyChart".  Sign up information is provided on this After Visit Summary.  MyChart is used to connect with patients for Virtual Visits (Telemedicine).  Patients are able to view lab/test results, encounter notes, upcoming appointments, etc.  Non-urgent messages can be sent to your provider as well.   To learn more about what you can do with MyChart, go to [email or web address].    Your next appointment:   Based on  results.

## 2021-02-21 NOTE — Progress Notes (Signed)
DONZELLA, CARROL ([id number]) Visit Report for 02/21/2021 Allergy List Details Patient Name: Date of Service: [name], [name] 02/21/2021 9:00 A M Medical Record Number: [id number] Patient Account Number: [id number] Date of Birth/Sex: Treating RN: 01-Apr-1955 (66 y.o. [name] Primary Care Theodus Ran: [name] Other Clinician: Referring Esco Joslyn: Treating Prudy Candy/Extender: Norval Gable, Murvin Natal in Treatment: 0 Allergies Active Allergies No Known Drug Allergies Allergy Notes Electronic Signature(s) Signed: 02/21/2021 5:00:37 PM By: [name] RN, BSN Entered By: [name] on 02/21/2021 09:38:45 -------------------------------------------------------------------------------- Arrival Information Details Patient Name: Date of Service: Erin Face D. 02/21/2021 9:00 A M Medical Record Number: [id number] Patient Account Number: [id number] Date of Birth/Sex: Treating RN: March 19, 1955 (66 y.o. [name] Primary Care Viviann Broyles: [name] Other Clinician: Referring Jonessa Triplett: Treating Jolynne Spurgin/Extender: Milus Banister in Treatment: 0 Visit Information Patient Arrived: Lyndel Pleasure Time: 09:19 Accompanied By: alone Transfer Assistance: None Patient Identification Verified: Yes Secondary Verification Process Completed: Yes Patient Requires Transmission-Based Precautions: No Patient Has Alerts: No History Since Last Visit Added or deleted any medications: No Any new allergies or adverse reactions: No Had a fall or experienced change in activities of daily living that may affect risk of falls: No Signs or symptoms of abuse/neglect since last visito No Hospitalized since last visit: No Implantable device outside of the clinic excluding cellular tissue based products placed in the center since last visit: No Pain Present Now: No Electronic Signature(s) Signed: 02/21/2021 5:00:37 PM By: [name] RN,  BSN Entered By: [name] on 02/21/2021 09:22:11 -------------------------------------------------------------------------------- Clinic Level of Care Assessment Details Patient Name: Date of Service: Erin Face D. 02/21/2021 9:00 A M Medical Record Number: [id number] Patient Account Number: [id number] Date of Birth/Sex: Treating RN: 07/10/1955 (66 y.o. [name] Primary Care Andres Bantz: [name] Other Clinician: Referring Kineta Fudala: Treating Kailena Lubas/Extender: Milus Banister in Treatment: 0 Clinic Level of Care Assessment Items TOOL 1 Quantity Score X- 1 0 Use when EandM and Procedure is performed on INITIAL visit ASSESSMENTS - Nursing Assessment / Reassessment X- 1 20 General Physical Exam (combine w/ comprehensive assessment (listed just below) when performed on new pt. evals) X- 1 25 Comprehensive Assessment (HX, ROS, Risk Assessments, Wounds Hx, etc.) ASSESSMENTS - Wound and Skin Assessment / Reassessment []  - 0 Dermatologic / Skin Assessment (not related to wound area) ASSESSMENTS - Ostomy and/or Continence Assessment and Care []  - 0 Incontinence Assessment and Management []  - 0 Ostomy Care Assessment and Management (repouching, etc.) PROCESS - Coordination of Care X - Simple Patient / Family Education for ongoing care 1 15 []  - 0 Complex (extensive) Patient / Family Education for ongoing care X- 1 10 Staff obtains Programmer, systems, Records, T Results / Process Orders est []  - 0 Staff telephones HHA, Nursing Homes / Clarify orders / etc []  - 0 Routine Transfer to another Facility (non-emergent condition) []  - 0 Routine Hospital Admission (non-emergent condition) X- 1 15 New Admissions / Biomedical engineer / Ordering NPWT Apligraf, etc. , []  - 0 Emergency Hospital Admission (emergent condition) PROCESS - Special Needs []  - 0 Pediatric / Minor Patient Management []  - 0 Isolation Patient Management []  - 0 Hearing /  Language / Visual special needs []  - 0 Assessment of Community assistance (transportation, D/C planning, etc.) []  - 0 Additional assistance / Altered mentation []  - 0 Support Surface(s) Assessment (bed, cushion, seat, etc.) INTERVENTIONS - Miscellaneous []  - 0 External ear exam []  - 0 Patient Transfer (multiple staff /  Hoyer Lift / Similar devices) []  - 0 Simple Staple / Suture removal (25 or less) []  - 0 Complex Staple / Suture removal (26 or more) []  - 0 Hypo/Hyperglycemic Management (do not check if billed separately) X- 1 15 Ankle / Brachial Index (ABI) - do not check if billed separately Has the patient been seen at the hospital within the last three years: Yes Total Score: 100 Level Of Care: New/Established - Level 3 Electronic Signature(s) Signed: 02/21/2021 5:00:37 PM By: [name] RN, BSN Entered By: [name] on 02/21/2021 11:56:21 -------------------------------------------------------------------------------- Compression Therapy Details Patient Name: Date of Service: Erin Face D. 02/21/2021 9:00 A M Medical Record Number: [id number] Patient Account Number: [id number] Date of Birth/Sex: Treating RN: 03/15/1955 (66 y.o. [name] Primary Care Trisha Morandi: [name] Other Clinician: Referring Tynan Boesel: Treating Fredy Gladu/Extender: Milus Banister in Treatment: 0 Compression Therapy Performed for Wound Assessment: Wound #3 Left,Lateral Lower Leg Performed By: Clinician [name], RN Compression Type: Four Layer Post Procedure Diagnosis Same as Pre-procedure Electronic Signature(s) Signed: 02/21/2021 5:00:37 PM By: [name] RN, BSN Entered By: [name] on 02/21/2021 10:05:55 -------------------------------------------------------------------------------- Compression Therapy Details Patient Name: Date of Service: Erin Face D. 02/21/2021 9:00 A M Medical Record Number: [id number] Patient  Account Number: [id number] Date of Birth/Sex: Treating RN: [birth year] (66 y.o. [name] Primary Care Geanette Buonocore: [name] Other Clinician: Referring Rita Vialpando: Treating Rontae Inglett/Extender: Milus Banister in Treatment: 0 Compression Therapy Performed for Wound Assessment: Wound #4 Left,Posterior Lower Leg Performed By: Clinician [name], RN Compression Type: Four Layer Post Procedure Diagnosis Same as Pre-procedure Electronic Signature(s) Signed: 02/21/2021 5:00:37 PM By: [name] RN, BSN Entered By: [name] on 02/21/2021 10:05:55 -------------------------------------------------------------------------------- Encounter Discharge Information Details Patient Name: Date of Service: Erin Face D. 02/21/2021 9:00 A M Medical Record Number: [id number] Patient Account Number: [id number] Date of Birth/Sex: Treating RN: 1955/11/04 (66 y.o. [name] Primary Care Maddon Horton: [name] Other Clinician: Referring Kamin Niblack: Treating Chrisma Hurlock/Extender: Milus Banister in Treatment: 0 Encounter Discharge Information Items Discharge Condition: Stable Ambulatory Status: Cane Discharge Destination: Home Transportation: Private Auto Accompanied By: alone Schedule Follow-up Appointment: Yes Clinical Summary of Care: Patient Declined Electronic Signature(s) Signed: 02/21/2021 5:00:37 PM By: [name] RN, BSN Entered By: [name] on 02/21/2021 11:57:29 -------------------------------------------------------------------------------- Lower Extremity Assessment Details Patient Name: Date of Service: Erin Face D. 02/21/2021 9:00 A M Medical Record Number: [id number] Patient Account Number: [id number] Date of Birth/Sex: Treating RN: Jun 22, 1955 (66 y.o. [name] Primary Care Khamron Gellert: [name] Other Clinician: Referring Giuliano Preece: Treating Valor Quaintance/Extender: Milus Banister in Treatment: 0 Edema Assessment Assessed: Shirlyn Goltz: No] [Right: No] Edema: [Left: Ye] [Right: s] Calf Left: Right: Point of Measurement: 29 cm From Medial Instep 48.5 cm Ankle Left: Right: Point of Measurement: From Medial Instep 23.2 cm Knee To Floor Left: Right: From Medial Instep 36 cm Vascular Assessment Pulses: Dorsalis Pedis Palpable: [Left:Yes] Blood Pressure: Brachial: [Left:133] Ankle: [Left:Dorsalis Pedis: 160 1.20] Electronic Signature(s) Signed: 02/21/2021 5:00:37 PM By: [name] RN, BSN Entered By: [name] on 02/21/2021 09:48:43 -------------------------------------------------------------------------------- Multi Wound Chart Details Patient Name: Date of Service: Erin Gerold [name] 02/21/2021 9:00 A M Medical Record Number: [id number] Patient Account Number: [id number] Date of Birth/Sex: Treating RN: [birth year] (66 y.o. [name] Primary Care Rosabel Sermeno: [name] Other Clinician: Referring Annel Zunker: Treating Lindsie Simar/Extender: Milus Banister in Treatment: 0 Vital Signs Height(in): Pulse(bpm): 99 Weight(lbs): Blood Pressure(mmHg):  133/84 Body Mass Index(BMI): Temperature(F): 98.2 Respiratory Rate(breaths/min): 18 Photos: [N/A:N/A] Left, Lateral Lower Leg Left, Posterior Lower Leg N/A Wound Location: Blister Blister N/A Wounding Event: Venous Leg Ulcer Venous Leg Ulcer N/A Primary Etiology: 12/13/2020 12/13/2020 N/A Date Acquired: 0 0 N/A Weeks of Treatment: Open Open N/A Wound Status: 5x7.4x0.2 6x5.5x0.1 N/A Measurements L x W x D (cm) 29.06 25.918 N/A A (cm) : rea 5.812 2.592 N/A Volume (cm) : 0.00% 0.00% N/A % Reduction in A rea: 0.00% 0.00% N/A % Reduction in Volume: Full Thickness Without Exposed Full Thickness Without Exposed N/A Classification: Support Structures Support Structures Medium Medium N/A Exudate Amount: Serosanguineous Serous  N/A Exudate Type: red, brown amber N/A Exudate Color: Flat and Intact Flat and Intact N/A Wound Margin: Small (1-33%) Large (67-100%) N/A Granulation Amount: Pink Pink N/A Granulation Quality: Large (67-100%) None Present (0%) N/A Necrotic Amount: Fat Layer (Subcutaneous Tissue): Yes Fat Layer (Subcutaneous Tissue): Yes N/A Exposed Structures: Fascia: No Fascia: No Tendon: No Tendon: No Muscle: No Muscle: No Joint: No Joint: No Bone: No Bone: No None None N/A Epithelialization: Compression Therapy Compression Therapy N/A Procedures Performed: Treatment Notes Electronic Signature(s) Signed: 02/21/2021 4:28:08 PM By: Linton Ham MD Signed: 02/21/2021 5:00:37 PM By: [name] RN, BSN Entered By: Linton Ham on 02/21/2021 10:14:09 -------------------------------------------------------------------------------- Multi-Disciplinary Care Plan Details Patient Name: Date of Service: Erin Face D. 02/21/2021 9:00 A M Medical Record Number: [id number] Patient Account Number: [id number] Date of Birth/Sex: Treating RN: 1955/11/08 (66 y.o. [name] Primary Care Zitlali Primm: [name] Other Clinician: Referring Kymani Shimabukuro: Treating Kelechi Orgeron/Extender: Milus Banister in Treatment: 0 Multidisciplinary Care Plan reviewed with physician Active Inactive Abuse / Safety / Falls / Self Care Management Nursing Diagnoses: Potential for falls Potential for injury related to falls Goals: Patient will not experience any injury related to falls Date Initiated: 02/21/2021 Target Resolution Date: 03/24/2021 Goal Status: Active Patient/caregiver will verbalize/demonstrate measures taken to prevent injury and/or falls Date Initiated: 02/21/2021 Target Resolution Date: 03/24/2021 Goal Status: Active Interventions: Assess Activities of Daily Living upon admission and as needed Assess fall risk on admission and as needed Assess: immobility,  friction, shearing, incontinence upon admission and as needed Assess impairment of mobility on admission and as needed per policy Assess personal safety and home safety (as indicated) on admission and as needed Assess self care needs on admission and as needed Provide education on fall prevention Provide education on personal and home safety Notes: Venous Leg Ulcer Nursing Diagnoses: Actual venous Insuffiency (use after diagnosis is confirmed) Goals: Patient will maintain optimal edema control Date Initiated: 02/21/2021 Target Resolution Date: 03/24/2021 Goal Status: Active Patient/caregiver will verbalize understanding of disease process and disease management Date Initiated: 02/21/2021 Target Resolution Date: 03/24/2021 Goal Status: Active Interventions: Assess peripheral edema status every visit. Compression as ordered Provide education on venous insufficiency Notes: Wound/Skin Impairment Nursing Diagnoses: Impaired tissue integrity Knowledge deficit related to ulceration/compromised skin integrity Goals: Patient/caregiver will verbalize understanding of skin care regimen Date Initiated: 02/21/2021 Target Resolution Date: 03/24/2021 Goal Status: Active Ulcer/skin breakdown will have a volume reduction of 30% by week 4 Date Initiated: 02/21/2021 Target Resolution Date: 03/24/2021 Goal Status: Active Interventions: Assess patient/caregiver ability to obtain necessary supplies Assess patient/caregiver ability to perform ulcer/skin care regimen upon admission and as needed Assess ulceration(s) every visit Provide education on ulcer and skin care Notes: Electronic Signature(s) Signed: 02/21/2021 5:00:37 PM By: [name] RN, BSN Entered By: [name] on 02/21/2021 11:55:24 -------------------------------------------------------------------------------- Pain Assessment Details Patient Name:  Date of Service: [name], [name] 02/21/2021 9:00 A M Medical Record Number:  [id number] Patient Account Number: [id number] Date of Birth/Sex: Treating RN: 1955/12/08 (66 y.o. [name] Primary Care Thelma Lorenzetti: [name] Other Clinician: Referring Kincaid Tiger: Treating Talayah Picardi/Extender: Milus Banister in Treatment: 0 Active Problems Location of Pain Severity and Description of Pain Patient Has Paino No Site Locations Pain Management and Medication Current Pain Management: Electronic Signature(s) Signed: 02/21/2021 5:00:37 PM By: [name] RN, BSN Entered By: [name] on 02/21/2021 09:58:37 -------------------------------------------------------------------------------- Patient/Caregiver Education Details Patient Name: Date of Service: [name] 1/10/2023andnbsp9:00 [place] Record Number: [id number] Patient Account Number: [id number] Date of Birth/Gender: Treating RN: 12-Nov-1955 (66 y.o. [name] Primary Care Physician: [name] Other Clinician: Referring Physician: Treating Physician/Extender: Milus Banister in Treatment: 0 Education Assessment Education Provided To: Patient Education Topics Provided Safety: Methods: Explain/Verbal Responses: State content correctly Venous: Methods: Explain/Verbal Responses: State content correctly Wound/Skin Impairment: Methods: Explain/Verbal Responses: State content correctly Electronic Signature(s) Signed: 02/21/2021 5:00:37 PM By: [name] RN, BSN Entered By: [name] on 02/21/2021 11:55:37 -------------------------------------------------------------------------------- Wound Assessment Details Patient Name: Date of Service: Erin Gerold [name] 02/21/2021 9:00 A M Medical Record Number: [id number] Patient Account Number: [id number] Date of Birth/Sex: Treating RN: 1955-09-29 (66 y.o. [name] Primary Care Zebulin Siegel: [name] Other Clinician: Referring Letzy Gullickson: Treating  Courtnay Petrilla/Extender: Milus Banister in Treatment: 0 Wound Status Wound Number: 3 Primary Etiology: Venous Leg Ulcer Wound Location: Left, Lateral Lower Leg Wound Status: Open Wounding Event: Blister Date Acquired: 12/13/2020 Weeks Of Treatment: 0 Clustered Wound: No Photos Wound Measurements Length: (cm) 5 Width: (cm) 7.4 Depth: (cm) 0.2 Area: (cm) 29.06 Volume: (cm) 5.812 % Reduction in Area: 0% % Reduction in Volume: 0% Epithelialization: None Tunneling: No Undermining: No Wound Description Classification: Full Thickness Without Exposed Support Structures Wound Margin: Flat and Intact Exudate Amount: Medium Exudate Type: Serosanguineous Exudate Color: red, brown Foul Odor After Cleansing: No Slough/Fibrino Yes Wound Bed Granulation Amount: Small (1-33%) Exposed Structure Granulation Quality: Pink Fascia Exposed: No Necrotic Amount: Large (67-100%) Fat Layer (Subcutaneous Tissue) Exposed: Yes Necrotic Quality: Adherent Slough Tendon Exposed: No Muscle Exposed: No Joint Exposed: No Bone Exposed: No Treatment Notes Wound #3 (Lower Leg) Wound Laterality: Left, Lateral Cleanser Soap and Water Discharge Instruction: May shower and wash wound with dial antibacterial soap and water prior to dressing change. Wound Cleanser Discharge Instruction: Cleanse the wound with wound cleanser prior to applying a clean dressing using gauze sponges, not tissue or cotton balls. Peri-Wound Care Triamcinolone 15 (g) Discharge Instruction: Use triamcinolone 15 (g) as directed Zinc Oxide Ointment 30g tube Discharge Instruction: Apply Zinc Oxide to periwound with each dressing change Sween Lotion (Moisturizing lotion) Discharge Instruction: Apply moisturizing lotion as directed Topical Primary Dressing IODOFLEX 0.9% Cadexomer Iodine Pad 4x6 cm Discharge Instruction: Apply to wound bed as instructed Secondary Dressing ABD Pad, 8x10 Discharge Instruction:  Apply over primary dressing as directed. Zetuvit Plus 4x8 in Discharge Instruction: Apply over primary dressing as directed. Secured With Compression Wrap FourPress (4 layer compression wrap) Discharge Instruction: Apply four layer compression as directed. May also use Miliken CoFlex 2 layer compression system as alternative. Compression Stockings Add-Ons Electronic Signature(s) Signed: 02/21/2021 3:01:20 PM By: Sandre Kitty Signed: 02/21/2021 5:00:37 PM By: [name] RN, BSN Entered By: Sandre Kitty on 02/21/2021 09:36:57 -------------------------------------------------------------------------------- Wound Assessment Details Patient Name: Date of Service: Erin Gerold [name] 02/21/2021 9:00 A M Medical  Record Number: [id number] Patient Account Number: [id number] Date of Birth/Sex: Treating RN: 06-18-1955 (66 y.o. [name] Primary Care Claris Guymon: [name] Other Clinician: Referring Karissa Meenan: Treating Antoinette Haskett/Extender: Milus Banister in Treatment: 0 Wound Status Wound Number: 4 Primary Etiology: Venous Leg Ulcer Wound Location: Left, Posterior Lower Leg Wound Status: Open Wounding Event: Blister Date Acquired: 12/13/2020 Weeks Of Treatment: 0 Clustered Wound: No Photos Wound Measurements Length: (cm) 6 Width: (cm) 5.5 Depth: (cm) 0.1 Area: (cm) 25.918 Volume: (cm) 2.592 % Reduction in Area: 0% % Reduction in Volume: 0% Epithelialization: None Tunneling: No Undermining: No Wound Description Classification: Full Thickness Without Exposed Support Structures Wound Margin: Flat and Intact Exudate Amount: Medium Exudate Type: Serous Exudate Color: amber Foul Odor After Cleansing: No Slough/Fibrino No Wound Bed Granulation Amount: Large (67-100%) Exposed Structure Granulation Quality: Pink Fascia Exposed: No Necrotic Amount: None Present (0%) Fat Layer (Subcutaneous Tissue) Exposed: Yes Tendon Exposed: No Muscle  Exposed: No Joint Exposed: No Bone Exposed: No Treatment Notes Wound #4 (Lower Leg) Wound Laterality: Left, Posterior Cleanser Soap and Water Discharge Instruction: May shower and wash wound with dial antibacterial soap and water prior to dressing change. Wound Cleanser Discharge Instruction: Cleanse the wound with wound cleanser prior to applying a clean dressing using gauze sponges, not tissue or cotton balls. Peri-Wound Care Triamcinolone 15 (g) Discharge Instruction: Use triamcinolone 15 (g) as directed Sween Lotion (Moisturizing lotion) Discharge Instruction: Apply moisturizing lotion as directed Topical Primary Dressing KerraCel Ag Gelling Fiber Dressing, 4x5 in (silver alginate) Discharge Instruction: Apply silver alginate to wound bed as instructed Secondary Dressing ABD Pad, 8x10 Discharge Instruction: Apply over primary dressing as directed. Zetuvit Plus 4x8 in Discharge Instruction: Apply over primary dressing as directed. Secured With Compression Wrap FourPress (4 layer compression wrap) Discharge Instruction: Apply four layer compression as directed. May also use Miliken CoFlex 2 layer compression system as alternative. Compression Stockings Add-Ons Electronic Signature(s) Signed: 02/21/2021 3:01:20 PM By: Sandre Kitty Signed: 02/21/2021 5:00:37 PM By: [name] RN, BSN Entered By: Sandre Kitty on 02/21/2021 09:37:41 -------------------------------------------------------------------------------- [place] Details Patient Name: Date of Service: Erin Gerold [name] 02/21/2021 9:00 A M Medical Record Number: [id number] Patient Account Number: [id number] Date of Birth/Sex: Treating RN: 1955/06/21 (66 y.o. [name] Primary Care Amanda Pote: [name] Other Clinician: Referring Sherrell Farish: Treating Reality Dejonge/Extender: Milus Banister in Treatment: 0 Vital Signs Time Taken: 09:19 Temperature (F): 98.2 Pulse (bpm):  99 Respiratory Rate (breaths/min): 18 Blood Pressure (mmHg): 133/84 Reference Range: 80 - 120 mg / dl Electronic Signature(s) Signed: 02/21/2021 5:00:37 PM By: [name] RN, BSN Entered By: [name] on 02/21/2021 09:38:29

## 2021-02-28 ENCOUNTER — Other Ambulatory Visit: Payer: Self-pay

## 2021-02-28 ENCOUNTER — Encounter (HOSPITAL_BASED_OUTPATIENT_CLINIC_OR_DEPARTMENT_OTHER): Payer: 59 | Admitting: Internal Medicine

## 2021-02-28 DIAGNOSIS — L97822 Non-pressure chronic ulcer of other part of left lower leg with fat layer exposed: Secondary | ICD-10-CM | POA: Diagnosis not present

## 2021-02-28 DIAGNOSIS — L03116 Cellulitis of left lower limb: Secondary | ICD-10-CM | POA: Diagnosis not present

## 2021-02-28 DIAGNOSIS — B957 Other staphylococcus as the cause of diseases classified elsewhere: Secondary | ICD-10-CM | POA: Diagnosis not present

## 2021-02-28 DIAGNOSIS — R600 Localized edema: Secondary | ICD-10-CM | POA: Diagnosis not present

## 2021-02-28 DIAGNOSIS — I87332 Chronic venous hypertension (idiopathic) with ulcer and inflammation of left lower extremity: Secondary | ICD-10-CM | POA: Diagnosis not present

## 2021-02-28 DIAGNOSIS — L97828 Non-pressure chronic ulcer of other part of left lower leg with other specified severity: Secondary | ICD-10-CM | POA: Diagnosis not present

## 2021-02-28 DIAGNOSIS — E669 Obesity, unspecified: Secondary | ICD-10-CM | POA: Diagnosis not present

## 2021-02-28 DIAGNOSIS — I739 Peripheral vascular disease, unspecified: Secondary | ICD-10-CM | POA: Diagnosis not present

## 2021-02-28 DIAGNOSIS — I872 Venous insufficiency (chronic) (peripheral): Secondary | ICD-10-CM | POA: Diagnosis not present

## 2021-02-28 DIAGNOSIS — L080 Pyoderma: Secondary | ICD-10-CM | POA: Diagnosis not present

## 2021-02-28 DIAGNOSIS — I10 Essential (primary) hypertension: Secondary | ICD-10-CM | POA: Diagnosis not present

## 2021-03-01 NOTE — Progress Notes (Signed)
DOCIA, KLAR ([id number]) Visit Report for 02/28/2021 Arrival Information Details Patient Name: Date of Service: [name], [name] 02/28/2021 3:00 PM Medical Record Number: [id number] Patient Account Number: [id number] Date of Birth/Sex: Treating RN: 07-16-1955 (66 y.o. [name] Primary Care Lennart Gladish: [name] Other Clinician: Referring Adeja Sarratt: Treating Mavery Milling/Extender: Milus Banister in Treatment: 1 Visit Information History Since Last Visit Added or deleted any medications: No Patient Arrived: [name] Any new allergies or adverse reactions: No Arrival Time: 15:02 Had a fall or experienced change in No Accompanied By: alone activities of daily living that may affect Transfer Assistance: None risk of falls: Patient Identification Verified: Yes Signs or symptoms of abuse/neglect since last visito No Secondary Verification Process Completed: Yes Hospitalized since last visit: No Patient Requires Transmission-Based Precautions: No Implantable device outside of the clinic excluding No Patient Has Alerts: No cellular tissue based products placed in the center since last visit: Has Dressing in Place as Prescribed: Yes Has Compression in Place as Prescribed: Yes Pain Present Now: No Electronic Signature(s) Signed: 03/01/2021 5:10:09 PM By: [name] RN, BSN Entered By: [name] on 02/28/2021 15:02:49 -------------------------------------------------------------------------------- Compression Therapy Details Patient Name: Date of Service: Erin Gerold [name]. 02/28/2021 3:00 PM Medical Record Number: [id number] Patient Account Number: [id number] Date of Birth/Sex: Treating RN: [birth year] (67 y.o. [name] Primary Care Telvin Reinders: [name] Other Clinician: Referring Citlaly Camplin: Treating Tylin Stradley/Extender: Milus Banister in Treatment: 1 Compression Therapy Performed for Wound Assessment: Wound #3  Left,Lateral Lower Leg Performed By: Clinician [name], RN Compression Type: Four Layer Post Procedure Diagnosis Same as Pre-procedure Electronic Signature(s) Signed: 03/01/2021 5:10:09 PM By: [name] RN, BSN Entered By: [name] on 02/28/2021 15:40:23 -------------------------------------------------------------------------------- Encounter Discharge Information Details Patient Name: Date of Service: Erin Gerold [name]. 02/28/2021 3:00 PM Medical Record Number: [id number] Patient Account Number: [id number] Date of Birth/Sex: Treating RN: 11-21-1955 (66 y.o. [name] Primary Care Cathrine Krizan: [name] Other Clinician: Referring Kadarious Dikes: Treating Erion Hermans/Extender: Milus Banister in Treatment: 1 Encounter Discharge Information Items Post Procedure Vitals Discharge Condition: Stable Temperature (F): 97.8 Ambulatory Status: Cane Pulse (bpm): 92 Discharge Destination: Home Respiratory Rate (breaths/min): 18 Transportation: Private Auto Blood Pressure (mmHg): 150/83 Accompanied By: alone Schedule Follow-up Appointment: Yes Clinical Summary of Care: Patient Declined Electronic Signature(s) Signed: 03/01/2021 5:10:09 PM By: [name] RN, BSN Entered By: [name] on 02/28/2021 18:13:32 -------------------------------------------------------------------------------- Lower Extremity Assessment Details Patient Name: Date of Service: Erin Gerold [name]. 02/28/2021 3:00 PM Medical Record Number: [id number] Patient Account Number: [id number] Date of Birth/Sex: Treating RN: [birth year] (66 y.o. [name] Primary Care [name]: [name] Other Clinician: Referring Bayler Gehrig: Treating Mehkai Gallo/Extender: Milus Banister in Treatment: 1 Edema Assessment Assessed: [Left: No] [Right: No] Edema: [Left: Ye] [Right: s] Calf Left: Right: Point of Measurement: 29 cm From Medial Instep 41.5  cm Ankle Left: Right: Point of Measurement: From Medial Instep 21.5 cm Vascular Assessment Pulses: Dorsalis Pedis Palpable: [Left:Yes] Electronic Signature(s) Signed: 03/01/2021 5:10:09 PM By: [name] RN, BSN Entered By: [name] on 02/28/2021 15:08:12 -------------------------------------------------------------------------------- Multi Wound Chart Details Patient Name: Date of Service: Erin Gerold [name]. 02/28/2021 3:00 PM Medical Record Number: [id number] Patient Account Number: [id number] Date of Birth/Sex: Treating RN: 10-May-1955 (66 y.o. [name] Primary Care Jahn Franchini: [name] Other Clinician: Referring Nicolus Ose: Treating Domani Bakos/Extender: Milus Banister in Treatment: 1 Vital Signs Height(in): Pulse(bpm): 92 Weight(lbs): Blood Pressure(mmHg): 150/83 Body Mass Index(BMI):  Temperature(F): 97.8 Respiratory Rate(breaths/min): 18 Photos: [N/A:N/A] Left, Lateral Lower Leg Left, Posterior Lower Leg N/A Wound Location: Blister Blister N/A Wounding Event: Venous Leg Ulcer Venous Leg Ulcer N/A Primary Etiology: Cataracts, Lymphedema, Cataracts, Lymphedema, N/A Comorbid History: Hypertension, Peripheral Venous Hypertension, Peripheral Venous Disease, Osteoarthritis Disease, Osteoarthritis 12/13/2020 12/13/2020 N/A Date Acquired: 1 1 N/A Weeks of Treatment: Open Open N/A Wound Status: 4.8x7x0.2 0x0x0 N/A Measurements L x W x D (cm) 26.389 0 N/A A (cm) : rea 5.278 0 N/A Volume (cm) : 9.20% 100.00% N/A % Reduction in Area: 9.20% 100.00% N/A % Reduction in Volume: Full Thickness Without Exposed Full Thickness Without Exposed N/A Classification: Support Structures Support Structures Medium None Present N/A Exudate Amount: Purulent N/A N/A Exudate Type: yellow, brown, green N/A N/A Exudate Color: Yes No N/A Foul Odor A Cleansing: fter No N/A N/A Odor Anticipated Due to Product Use: Flat and Intact  Flat and Intact N/A Wound Margin: Small (1-33%) None Present (0%) N/A Granulation A mount: Pink N/A N/A Granulation Quality: Large (67-100%) None Present (0%) N/A Necrotic Amount: Fat Layer (Subcutaneous Tissue): Yes Fascia: No N/A Exposed Structures: Fascia: No Fat Layer (Subcutaneous Tissue): No Tendon: No Tendon: No Muscle: No Muscle: No Joint: No Joint: No Bone: No Bone: No None Large (67-100%) N/A Epithelialization: Debridement - Excisional N/A N/A Debridement: Pre-procedure Verification/Time Out 15:37 N/A N/A Taken: Subcutaneous, Slough N/A N/A Tissue Debrided: Skin/Subcutaneous Tissue N/A N/A Level: 33.6 N/A N/A Debridement A (sq cm): rea Curette N/A N/A Instrument: Moderate N/A N/A Bleeding: Pressure N/A N/A Hemostasis A chieved: 0 N/A N/A Procedural Pain: 0 N/A N/A Post Procedural Pain: Procedure was tolerated well N/A N/A Debridement Treatment Response: 4.8x7x0.2 N/A N/A Post Debridement Measurements L x W x D (cm) 5.278 N/A N/A Post Debridement Volume: (cm) Compression Therapy N/A N/A Procedures Performed: Debridement Treatment Notes Electronic Signature(s) Signed: 02/28/2021 4:48:32 PM By: Linton Ham MD Signed: 03/01/2021 5:10:09 PM By: [name] RN, BSN Entered By: Linton Ham on 02/28/2021 16:21:35 -------------------------------------------------------------------------------- Multi-Disciplinary Care Plan Details Patient Name: Date of Service: [name] 02/28/2021 3:00 PM Medical Record Number: [id number] Patient Account Number: [id number] Date of Birth/Sex: Treating RN: 23-May-1955 (66 y.o. [name] Primary Care Estephani Popper: [name] Other Clinician: Referring Teesha Ohm: Treating Siani Utke/Extender: Milus Banister in Treatment: 1 Multidisciplinary Care Plan reviewed with physician Active Inactive Abuse / Safety / Falls / Self Care Management Nursing Diagnoses: Potential for  falls Potential for injury related to falls Goals: Patient will not experience any injury related to falls Date Initiated: 02/21/2021 Target Resolution Date: 03/24/2021 Goal Status: Active Patient/caregiver will verbalize/demonstrate measures taken to prevent injury and/or falls Date Initiated: 02/21/2021 Target Resolution Date: 03/24/2021 Goal Status: Active Interventions: Assess Activities of Daily Living upon admission and as needed Assess fall risk on admission and as needed Assess: immobility, friction, shearing, incontinence upon admission and as needed Assess impairment of mobility on admission and as needed per policy Assess personal safety and home safety (as indicated) on admission and as needed Assess self care needs on admission and as needed Provide education on fall prevention Provide education on personal and home safety Notes: Venous Leg Ulcer Nursing Diagnoses: Actual venous Insuffiency (use after diagnosis is confirmed) Goals: Patient will maintain optimal edema control Date Initiated: 02/21/2021 Target Resolution Date: 03/24/2021 Goal Status: Active Patient/caregiver will verbalize understanding of disease process and disease management Date Initiated: 02/21/2021 Target Resolution Date: 03/24/2021 Goal Status: Active Interventions: Assess peripheral edema status every visit. Compression as ordered  Provide education on venous insufficiency Notes: Wound/Skin Impairment Nursing Diagnoses: Impaired tissue integrity Knowledge deficit related to ulceration/compromised skin integrity Goals: Patient/caregiver will verbalize understanding of skin care regimen Date Initiated: 02/21/2021 Target Resolution Date: 03/24/2021 Goal Status: Active Ulcer/skin breakdown will have a volume reduction of 30% by week 4 Date Initiated: 02/21/2021 Target Resolution Date: 03/24/2021 Goal Status: Active Interventions: Assess patient/caregiver ability to obtain necessary  supplies Assess patient/caregiver ability to perform ulcer/skin care regimen upon admission and as needed Assess ulceration(s) every visit Provide education on ulcer and skin care Notes: Electronic Signature(s) Signed: 03/01/2021 5:10:09 PM By: [name] RN, BSN Entered By: [name] on 02/28/2021 18:10:54 -------------------------------------------------------------------------------- Pain Assessment Details Patient Name: Date of Service: Erin Gerold [name]. 02/28/2021 3:00 PM Medical Record Number: [id number] Patient Account Number: [id number] Date of Birth/Sex: Treating RN: [birth year] (66 y.o. [name] Primary Care Xerxes Agrusa: [name] Other Clinician: Referring Slayde Brault: Treating Jake Goodson/Extender: Milus Banister in Treatment: 1 Active Problems Location of Pain Severity and Description of Pain Patient Has Paino No Site Locations Pain Management and Medication Current Pain Management: Electronic Signature(s) Signed: 03/01/2021 5:10:09 PM By: [name] RN, BSN Entered By: [name] on 02/28/2021 15:03:13 -------------------------------------------------------------------------------- Patient/Caregiver Education Details Patient Name: Date of Service: [name] 1/17/2023andnbsp3:00 PM Medical Record Number: [id number] Patient Account Number: [id number] Date of Birth/Gender: Treating RN: 1955/05/26 (66 y.o. [name] Primary Care Physician: [name] Other Clinician: Referring Physician: Treating Physician/Extender: Milus Banister in Treatment: 1 Education Assessment Education Provided To: Patient Education Topics Provided Venous: Methods: Explain/Verbal Responses: State content correctly Wound/Skin Impairment: Methods: Explain/Verbal Responses: State content correctly Electronic Signature(s) Signed: 03/01/2021 5:10:09 PM By: [name] RN, BSN Entered By: [name] on 02/28/2021 18:11:05 -------------------------------------------------------------------------------- Wound Assessment Details Patient Name: Date of Service: Erin Gerold [name]. 02/28/2021 3:00 PM Medical Record Number: [id number] Patient Account Number: [id number] Date of Birth/Sex: Treating RN: 17-Oct-1955 (66 y.o. [name] Primary Care Rodrigus Kilker: [name] Other Clinician: Referring Mir Fullilove: Treating Mickle Campton/Extender: Milus Banister in Treatment: 1 Wound Status Wound Number: 3 Primary Venous Leg Ulcer Etiology: Wound Location: Left, Lateral Lower Leg Wound Open Wounding Event: Blister Status: Date Acquired: 12/13/2020 Comorbid Cataracts, Lymphedema, Hypertension, Peripheral Venous Weeks Of Treatment: 1 History: Disease, Osteoarthritis Clustered Wound: No Photos Wound Measurements Length: (cm) 4.8 Width: (cm) 7 Depth: (cm) 0.2 Area: (cm) 26.389 Volume: (cm) 5.278 % Reduction in Area: 9.2% % Reduction in Volume: 9.2% Epithelialization: None Tunneling: No Undermining: No Wound Description Classification: Full Thickness Without Exposed Support Structures Wound Margin: Flat and Intact Exudate Amount: Medium Exudate Type: Purulent Exudate Color: yellow, brown, green Foul Odor After Cleansing: Yes Due to Product Use: No Slough/Fibrino Yes Wound Bed Granulation Amount: Small (1-33%) Exposed Structure Granulation Quality: Pink Fascia Exposed: No Necrotic Amount: Large (67-100%) Fat Layer (Subcutaneous Tissue) Exposed: Yes Necrotic Quality: Adherent Slough Tendon Exposed: No Muscle Exposed: No Joint Exposed: No Bone Exposed: No Treatment Notes Wound #3 (Lower Leg) Wound Laterality: Left, Lateral Cleanser Soap and Water Discharge Instruction: May shower and wash wound with dial antibacterial soap and water prior to dressing change. Wound Cleanser Discharge Instruction: Cleanse the wound with wound cleanser prior to  applying a clean dressing using gauze sponges, not tissue or cotton balls. Peri-Wound Care Triamcinolone 15 (g) Discharge Instruction: Use triamcinolone 15 (g) as directed Zinc Oxide Ointment 30g tube Discharge Instruction: Apply Zinc Oxide to periwound with each dressing change Sween Lotion (Moisturizing lotion) Discharge  Instruction: Apply moisturizing lotion as directed Topical Primary Dressing IODOFLEX 0.9% Cadexomer Iodine Pad 4x6 cm Discharge Instruction: Apply to wound bed as instructed Secondary Dressing ABD Pad, 8x10 Discharge Instruction: Apply over primary dressing as directed. Zetuvit Plus 4x8 in Discharge Instruction: Apply over primary dressing as directed. CarboFLEX Odor Control Dressing, 4x4 in Discharge Instruction: Apply over primary dressing as directed. Secured With Compression Wrap FourPress (4 layer compression wrap) Discharge Instruction: Apply four layer compression as directed. May also use Miliken CoFlex 2 layer compression system as alternative. Compression Stockings Add-Ons Electronic Signature(s) Signed: 03/01/2021 4:19:33 PM By: Sandre Kitty Signed: 03/01/2021 5:10:09 PM By: [name] RN, BSN Entered By: Sandre Kitty on 02/28/2021 15:14:55 -------------------------------------------------------------------------------- Wound Assessment Details Patient Name: Date of Service: Erin Gerold [name]. 02/28/2021 3:00 PM Medical Record Number: [id number] Patient Account Number: [id number] Date of Birth/Sex: Treating RN: 10-21-1955 (66 y.o. [name] Primary Care Amatullah Christy: [name] Other Clinician: Referring Valinda Fedie: Treating Ellon Marasco/Extender: Milus Banister in Treatment: 1 Wound Status Wound Number: 4 Primary Venous Leg Ulcer Etiology: Wound Location: Left, Posterior Lower Leg Wound Open Wounding Event: Blister Status: Date Acquired: 12/13/2020 Comorbid Cataracts, Lymphedema, Hypertension, Peripheral  Venous Weeks Of Treatment: 1 History: Disease, Osteoarthritis Clustered Wound: No Photos Wound Measurements Length: (cm) Width: (cm) Depth: (cm) Area: (cm) Volume: (cm) 0 % Reduction in Area: 100% 0 % Reduction in Volume: 100% 0 Epithelialization: Large (67-100%) 0 Tunneling: No 0 Undermining: No Wound Description Classification: Full Thickness Without Exposed Support Structures Wound Margin: Flat and Intact Exudate Amount: None Present Foul Odor After Cleansing: No Slough/Fibrino No Wound Bed Granulation Amount: None Present (0%) Exposed Structure Necrotic Amount: None Present (0%) Fascia Exposed: No Fat Layer (Subcutaneous Tissue) Exposed: No Tendon Exposed: No Muscle Exposed: No Joint Exposed: No Bone Exposed: No Electronic Signature(s) Signed: 03/01/2021 4:19:33 PM By: Sandre Kitty Signed: 03/01/2021 5:10:09 PM By: [name] RN, BSN Entered By: Sandre Kitty on 02/28/2021 15:15:28 -------------------------------------------------------------------------------- [place] Details Patient Name: Date of Service: Erin Gerold [name]. 02/28/2021 3:00 PM Medical Record Number: [id number] Patient Account Number: [id number] Date of Birth/Sex: Treating RN: 1955/10/02 (66 y.o. [name] Primary Care Aricela Bertagnolli: [name] Other Clinician: Referring Syana Degraffenreid: Treating Billey Wojciak/Extender: Milus Banister in Treatment: 1 Vital Signs Time Taken: 15:02 Temperature (F): 97.8 Pulse (bpm): 92 Respiratory Rate (breaths/min): 18 Blood Pressure (mmHg): 150/83 Reference Range: 80 - 120 mg / dl Electronic Signature(s) Signed: 03/01/2021 5:10:09 PM By: [name] RN, BSN Entered By: [name] on 02/28/2021 15:03:05

## 2021-03-01 NOTE — Progress Notes (Signed)
Erin, [name] ([id number]) Visit Report for 02/28/2021 Debridement Details Patient Name: Date of Service: Erin, [name] 02/28/2021 3:00 PM Medical Record Number: [id number] Patient Account Number: [id number] Date of Birth/Sex: Treating RN: Jul 05, 1955 (66 y.o. [name] Primary Care Provider: [name] Other Clinician: Referring Provider: Treating Provider/Extender: Milus Banister in Treatment: 1 Debridement Performed for Assessment: Wound #3 Left,Lateral Lower Leg Performed By: Physician Ricard Dillon., MD Debridement Type: Debridement Severity of Tissue Pre Debridement: Fat layer exposed Level of Consciousness (Pre-procedure): Awake and Alert Pre-procedure Verification/Time Out Yes - 15:37 Taken: Start Time: 15:37 T Area Debrided (L x W): otal 4.8 (cm) x 7 (cm) = 33.6 (cm) Tissue and other material debrided: Viable, Non-Viable, Slough, Subcutaneous, Slough Level: Skin/Subcutaneous Tissue Debridement Description: Excisional Instrument: Curette Specimen: Tissue Culture Number of Specimens T aken: 1 Bleeding: Moderate Hemostasis Achieved: Pressure End Time: 15:39 Procedural Pain: 0 Post Procedural Pain: 0 Response to Treatment: Procedure was tolerated well Level of Consciousness (Post- Awake and Alert procedure): Post Debridement Measurements of Total Wound Length: (cm) 4.8 Width: (cm) 7 Depth: (cm) 0.2 Volume: (cm) 5.278 Character of Wound/Ulcer Post Debridement: Requires Further Debridement Severity of Tissue Post Debridement: Fat layer exposed Post Procedure Diagnosis Same as Pre-procedure Electronic Signature(s) Signed: 02/28/2021 4:48:32 PM By: Linton Ham MD Signed: 03/01/2021 5:10:09 PM By: [name] RN, BSN Entered By: Linton Ham on 02/28/2021 16:21:46 -------------------------------------------------------------------------------- HPI Details Patient Name: Date of Service: [name] [name].  02/28/2021 3:00 PM Medical Record Number: [id number] Patient Account Number: [id number] Date of Birth/Sex: Treating RN: 08/03/1955 (66 y.o. 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 She tells [name] that she fell on the gravel driveway in July. She developed injuries on her distal lower leg which have not healed. She saw her primary physician on 11/15/2017 who noted her left shin injuries. Gave her antibiotics. At that point the wounds were almost circumferential however most were less than 1.5 cm. Weeping edema fluid was noted. She was referred here for evaluation. The patient has a history of chronic lower extremity edema. 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 She has had prior history with leg weeping fluid. She does not wear compression stockings. She is not doing anything specific to these wound areas. 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 She also has lymphedema. Put her in 3 layer compression and silver alginate last week. She has 3 small wounds with depth just lateral to the tibia. More problematically than this she has numerous shallow areas some of which are almost canal like in shape with tightly adherent painful debris. It would be very difficult and time- consuming to go through this and attempt to individually debride all  these areas.. I changed her to collagen today to see if that would help with any of the surface debris on some of these wounds. Otherwise we will not be able to put this in compression we have to  have someone change the dressing. The patient is not eligible for home health 12/25/17 on evaluation today patient actually appears to be doing rather well in regard to the ulcer on her lower extremity. Fortunately there does not appear to be evidence of infection at this time. She has been tolerating the dressing changes without complication. This includes the compression wrap. The only issue she had was that the wrap was initially placed over her bunion region which actually calls her some discomfort and pain. Other than that things seem to be going rather well. 01/01/2018 Seen today for follow-up and management of left lower extremity wound and lymphedema. T oday she presents with a new wound towards to the left lateral LE. Recently treated with a 7 day course of amoxicillin; reason for antibiotic dose is unknown at this time. Tolerating current treatment of collagen with 4- layer wraps. She obtained a venous reflux study on 12/25/17. Studies show on the right abnormal reflux times of the popliteal vein, great saphenous vein at the saphenofemoral junction at the proximal thigh, great saphenous vein at the mid calf, and origin of the small saphenous vein.No superficial thrombosis. No deep vein thrombosis in the common femoral, femoral,and popliteal veins. Left abnormal reflex times as well of the common femoral vein, popliteal vein, and a great saphenous vein at the saphenofemoral junction, In great saphenous vein at the mid thigh w/o thrombosis. Has any issues or concerns during visit today. Recommended follow-up to vascular specialist due to abnormalities from the venous reflux study. Denies fever, pain, chills, dizziness, nausea, or vomiting. 01/08/18 upon evaluation today the patient actually seems to  be showing some signs of improvement in my opinion at this point in regard to the lower extremity ulcerated areas. She still has a lot of drainage but fortunately nothing that appears to be too significant currently. I have been very happy with the overall progress I see today compared to where things were during the last evaluation that I had with her. Nonetheless she has not had her appointment with the vein specialist as of yet in fact we were able to get this approved for her today and confirmed with them she will be seeing them on December 26. Nonetheless in general I do feel like the compression wraps is doing well for her. 01/17/18; quite a bit of improvement since last time I saw this patient she has a small open area remaining on the left lateral calf and even smaller area medially. She has lymphedema chronic stasis changes with distal skin fibrosis. She has an appointment with vascular surgery later this month 01/24/2018; the patient's medial leg has closed. Still a small open area on the left lateral leg. She states that the 4 layer compression we put on last week was too tight and she had to take it off over a few days ago. We have had resultant increase in her lymphedema in the dorsal foot and a proximal calf. Fortunately that does not seem to have resulted in any deterioration in her wounds The patient is going to need compression stockings. We have given her measurements to phone elastic therapy in [place]. She has vascular surgery consult on December 26 02/07/18; she's had continuous contraction on the left lateral leg wound which is now very small. Currently using silver alginate under 3 layer compression She saw Dr. Trula Slade of vascular surgery on 02/06/18. It was noted that she had a normal reflux times in the popliteal vein, great saphenous vein at the saphenofemoral junction,  great saphenous vein at the proximal thigh great saphenous vein at the mid calf and origin of the small  saphenous vein. It was noted that she had significant reflux in the left saphenous veins with diameter measurements in the 0.7-0.8 cm range. It was felt she would benefit from laser ablation to help minimize the risk of ulcer recurrence. It was recommended that she wear 20-30 thigh-high compression stockings and have follow-up in 4-6 weeks 02/17/2018; I thought this lady would be healed however her compression slipped down and she developed increasing swelling and the wound is actually larger. 1/13; we had deterioration last week after the patient's compression slipped down and she developed periwound swelling. I increased her compression before layers we have been using silver alginate we are a lot better again today. She has her compression stockings in waiting 1/23; the patient's wounds are totally healed today. She has her stockings. This was almost circumferential skin damage. She follows up with Dr. Trula Slade of vascular surgery next Monday. READMISSION 02/21/2021 This is a now 66 year old woman that we had in clinic here discharging in January 2020 with wounds on her left calf chronic venous insufficiency. She was discharged with 30/40 stockings. It does not sound like she has worn stockings in about a year largely from not being able to get them on herself. In November she developed new blisters on her legs an area laterally is opened into a fairly sizable wound. She has weeping posteriorly as well. She has been using Neosporin and Band-Aids. The patient did see Dr. Trula Slade in 2019 and 2020. He felt she might benefit from laser ablation in her left saphenous veins although because of the wound that healed I do not think he went through with it. She might benefit from seeing him again. Her ABI on the left is 1. 1/17; patient's wound on the posterior left calf is closed she has the 2 large areas last week. We put her in 4-layer compression for edema control is a lot better. Our intake nurse noted  greenish drainage and odor. We have been using silver alginate Electronic Signature(s) Signed: 02/28/2021 4:48:32 PM By: Linton Ham MD Entered By: Linton Ham on 02/28/2021 16:22:45 -------------------------------------------------------------------------------- Physical Exam Details Patient Name: Date of Service: [name] [name]. 02/28/2021 3:00 PM Medical Record Number: [id number] Patient Account Number: [id number] Date of Birth/Sex: Treating RN: 1955/06/20 (66 y.o. [name] Primary Care Provider: [name] Other Clinician: Referring Provider: Treating Provider/Extender: Milus Banister in Treatment: 1 Constitutional Patient is hypertensive.. Pulse regular and within target range for patient.Marland Kitchen Respirations regular, non-labored and within target range.. Temperature is normal and within the target range for the patient.Marland Kitchen Appears in no distress. Notes Wound exam; the 2 areas on the left lateral calf both have necrotic surface is the same as last week. The area on the posterior calf is actually epithelialized. She has much better edema control. I used a #5 curette for an aggressive subcutaneous debridement. After debridement specimen obtained for deep tissue culture Electronic Signature(s) Signed: 02/28/2021 4:48:32 PM By: Linton Ham MD Entered By: Linton Ham on 02/28/2021 16:26:51 -------------------------------------------------------------------------------- Physician Orders Details Patient Name: Date of Service: [name] [name]. 02/28/2021 3:00 PM Medical Record Number: [id number] Patient Account Number: [id number] Date of Birth/Sex: Treating RN: [birth year] (66 y.o. [name] Primary Care Provider: [name] Other Clinician: Referring Provider: Treating Provider/Extender: Milus Banister in Treatment: 1 Verbal / Phone Orders: No Diagnosis Coding ICD-10 Coding Code Description  W65.681  Chronic venous hypertension (idiopathic) with ulcer and inflammation of left lower extremity L97.828 Non-pressure chronic ulcer of other part of left lower leg with other specified severity Follow-up Appointments ppointment in 1 week. - Dr. Dellia Nims Return A Bathing/ Shower/ Hygiene May shower with protection but do not get wound dressing(s) wet. - Ok to use [employer], can purchase at [facility], [facility], or [employer] Edema Control - Lymphedema / SCD / Other Elevate legs to the level of the heart or above for 30 minutes daily and/or when sitting, a frequency of: - throughout the day Avoid standing for long periods of time. Exercise regularly Wound Treatment Wound #3 - Lower Leg Wound Laterality: Left, Lateral Cleanser: Soap and Water 1 x Per Week Discharge Instructions: May shower and wash wound with dial antibacterial soap and water prior to dressing change. Cleanser: Wound Cleanser 1 x Per Week Discharge Instructions: Cleanse the wound with wound cleanser prior to applying a clean dressing using gauze sponges, not tissue or cotton balls. Peri-Wound Care: Triamcinolone 15 (g) 1 x Per Week Discharge Instructions: Use triamcinolone 15 (g) as directed Peri-Wound Care: Zinc Oxide Ointment 30g tube 1 x Per Week Discharge Instructions: Apply Zinc Oxide to periwound with each dressing change Peri-Wound Care: Sween Lotion (Moisturizing lotion) 1 x Per Week Discharge Instructions: Apply moisturizing lotion as directed Prim Dressing: IODOFLEX 0.9% Cadexomer Iodine Pad 4x6 cm 1 x Per Week ary Discharge Instructions: Apply to wound bed as instructed Secondary Dressing: ABD Pad, 8x10 1 x Per Week Discharge Instructions: Apply over primary dressing as directed. Secondary Dressing: Zetuvit Plus 4x8 in 1 x Per Week Discharge Instructions: Apply over primary dressing as directed. Secondary Dressing: CarboFLEX Odor Control Dressing, 4x4 in 1 x Per Week Discharge Instructions: Apply over primary dressing as  directed. Compression Wrap: FourPress (4 layer compression wrap) 1 x Per Week Discharge Instructions: Apply four layer compression as directed. May also use Miliken CoFlex 2 layer compression system as alternative. Laboratory naerobe culture (MICRO) - Left lateral lower leg - (ICD10 L97.828 - Non-pressure chronic ulcer of Bacteria identified in Unspecified specimen by A other part of left lower leg with other specified severity) LOINC Code: 275-1 Convenience Name: Anerobic culture Electronic Signature(s) Signed: 02/28/2021 4:48:32 PM By: Linton Ham MD Signed: 03/01/2021 5:10:09 PM By: [name] RN, BSN Entered By: [name] on 02/28/2021 15:46:04 -------------------------------------------------------------------------------- Problem List Details Patient Name: Date of Service: [name] [name]. 02/28/2021 3:00 PM Medical Record Number: [id number] Patient Account Number: [id number] Date of Birth/Sex: Treating RN: Jan 05, 1956 (66 y.o. [name] Primary Care Provider: [name] Other Clinician: Referring Provider: Treating Provider/Extender: Milus Banister in Treatment: 1 Active Problems ICD-10 Encounter Code Description Active Date MDM Diagnosis 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 (idiopathic) with ulcer and inflammation of left 02/21/2021 No Yes lower extremity L97.828 Non-pressure chronic ulcer of other part of left lower leg with other specified 02/21/2021 No Yes severity Inactive Problems Resolved Problems Electronic Signature(s) Signed: 02/28/2021 4:48:32 PM By: Linton Ham MD Entered By: Linton Ham on 02/28/2021 16:21:29 -------------------------------------------------------------------------------- Progress Note Details Patient Name: Date of Service: [name] [name]. 02/28/2021 3:00 PM Medical Record Number: [id number] Patient Account Number: [id number] Date of Birth/Sex: Treating RN: Jul 05, 1955 (65 y.o. 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 She  tells [name] that she fell on the gravel driveway in July. She developed injuries on her distal lower leg which have not healed. She saw her primary physician on 11/15/2017 who noted her left shin injuries. Gave her antibiotics. At that point the wounds were almost circumferential however most were less than 1.5 cm. Weeping edema fluid was noted. She was referred here for evaluation. The patient has a history of chronic lower extremity edema. 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 She has had prior history with leg weeping fluid. She does not wear compression stockings. She is not doing anything specific to these wound areas. 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 She also has lymphedema. Put her in 3 layer compression and silver alginate last week. She has 3 small wounds with depth just lateral to the tibia. More problematically than this she has numerous shallow areas some of which are almost canal like in shape with tightly adherent painful debris. It would be very difficult and time- consuming to go through this and attempt to individually debride all these areas.. I changed her to collagen today to see if that would help with any of the surface debris on some of these wounds. Otherwise we  will not be able to put this in compression we have to have someone change the dressing. The patient is not eligible for home health 12/25/17 on evaluation today patient actually appears to be doing rather well in regard to the ulcer on her lower extremity. Fortunately there does not appear to be evidence of infection at this time. She has been tolerating the dressing changes without complication. This includes the compression wrap. The only issue she had was that the wrap was initially placed over her bunion region which actually calls her some discomfort and pain. Other than that things seem to be going rather well. 01/01/2018 Seen today for follow-up and management of left lower extremity wound and lymphedema. T oday she presents with a new wound towards to the left lateral LE. Recently treated with a 7 day course of amoxicillin; reason for antibiotic dose is unknown at this time. Tolerating current treatment of collagen with 4- layer wraps. She obtained a venous reflux study on 12/25/17. Studies show on the right abnormal reflux times of the popliteal vein, great saphenous vein at the saphenofemoral junction at the proximal thigh, great saphenous vein at the mid calf, and origin of the small saphenous vein.No superficial thrombosis. No deep vein thrombosis in the common femoral, femoral,and popliteal veins. Left abnormal reflex times as well of the common femoral vein, popliteal vein, and a great saphenous vein at the saphenofemoral junction, In great saphenous vein at the mid thigh w/o thrombosis. Has any issues or concerns during visit today. Recommended follow-up to vascular specialist due to abnormalities from the venous reflux study. Denies fever, pain, chills, dizziness, nausea, or vomiting. 01/08/18 upon evaluation today the patient actually seems to be showing some signs of improvement in my opinion at this point in regard to the lower extremity ulcerated areas. She still has a lot of  drainage but fortunately nothing that appears to be too significant currently. I have  been very happy with the overall progress I see today compared to where things were during the last evaluation that I had with her. Nonetheless she has not had her appointment with the vein specialist as of yet in fact we were able to get this approved for her today and confirmed with them she will be seeing them on December 26. Nonetheless in general I do feel like the compression wraps is doing well for her. 01/17/18; quite a bit of improvement since last time I saw this patient she has a small open area remaining on the left lateral calf and even smaller area medially. She has lymphedema chronic stasis changes with distal skin fibrosis. She has an appointment with vascular surgery later this month 01/24/2018; the patient's medial leg has closed. Still a small open area on the left lateral leg. She states that the 4 layer compression we put on last week was too tight and she had to take it off over a few days ago. We have had resultant increase in her lymphedema in the dorsal foot and a proximal calf. Fortunately that does not seem to have resulted in any deterioration in her wounds The patient is going to need compression stockings. We have given her measurements to phone elastic therapy in [place]. She has vascular surgery consult on December 26 02/07/18; she's had continuous contraction on the left lateral leg wound which is now very small. Currently using silver alginate under 3 layer compression She saw Dr. Trula Slade of vascular surgery on 02/06/18. It was noted that she had a normal reflux times in the popliteal vein, great saphenous vein at the saphenofemoral junction, great saphenous vein at the proximal thigh great saphenous vein at the mid calf and origin of the small saphenous vein. It was noted that she had significant reflux in the left saphenous veins with diameter measurements in the 0.7-0.8 cm range.  It was felt she would benefit from laser ablation to help minimize the risk of ulcer recurrence. It was recommended that she wear 20-30 thigh-high compression stockings and have follow-up in 4-6 weeks 02/17/2018; I thought this lady would be healed however her compression slipped down and she developed increasing swelling and the wound is actually larger. 1/13; we had deterioration last week after the patient's compression slipped down and she developed periwound swelling. I increased her compression before layers we have been using silver alginate we are a lot better again today. She has her compression stockings in waiting 1/23; the patient's wounds are totally healed today. She has her stockings. This was almost circumferential skin damage. She follows up with Dr. Trula Slade of vascular surgery next Monday. READMISSION 02/21/2021 This is a now 66 year old woman that we had in clinic here discharging in January 2020 with wounds on her left calf chronic venous insufficiency. She was discharged with 30/40 stockings. It does not sound like she has worn stockings in about a year largely from not being able to get them on herself. In November she developed new blisters on her legs an area laterally is opened into a fairly sizable wound. She has weeping posteriorly as well. She has been using Neosporin and Band-Aids. The patient did see Dr. Trula Slade in 2019 and 2020. He felt she might benefit from laser ablation in her left saphenous veins although because of the wound that healed I do not think he went through with it. She might benefit from seeing him again. Her ABI on the left is 1. 1/17; patient's wound on the  posterior left calf is closed she has the 2 large areas last week. We put her in 4-layer compression for edema control is a lot better. Our intake nurse noted greenish drainage and odor. We have been using silver alginate Objective Constitutional Patient is hypertensive.. Pulse regular and within  target range for patient.Marland Kitchen Respirations regular, non-labored and within target range.. Temperature is normal and within the target range for the patient.Marland Kitchen Appears in no distress. Vitals Time Taken: 3:02 PM, Temperature: 97.8 F, Pulse: 92 bpm, Respiratory Rate: 18 breaths/min, Blood Pressure: 150/83 mmHg. General Notes: Wound exam; the 2 areas on the left lateral calf both have necrotic surface is the same as last week. The area on the posterior calf is actually epithelialized. She has much better edema control. I used a #5 curette for an aggressive subcutaneous debridement. After debridement specimen obtained for deep tissue culture Integumentary (Hair, Skin) Wound #3 status is Open. Original cause of wound was Blister. The date acquired was: 12/13/2020. The wound has been in treatment 1 weeks. The wound is located on the Left,Lateral Lower Leg. The wound measures 4.8cm length x 7cm width x 0.2cm depth; 26.389cm^2 area and 5.278cm^3 volume. There is Fat Layer (Subcutaneous Tissue) exposed. There is no tunneling or undermining noted. There is a medium amount of purulent drainage noted. Foul odor after cleansing was noted. The wound margin is flat and intact. There is small (1-33%) pink granulation within the wound bed. There is a large (67-100%) amount of necrotic tissue within the wound bed including Adherent Slough. Wound #4 status is Open. Original cause of wound was Blister. The date acquired was: 12/13/2020. The wound has been in treatment 1 weeks. The wound is located on the Left,Posterior Lower Leg. The wound measures 0cm length x 0cm width x 0cm depth; 0cm^2 area and 0cm^3 volume. There is no tunneling or undermining noted. There is a none present amount of drainage noted. The wound margin is flat and intact. There is no granulation within the wound bed. There is no necrotic tissue within the wound bed. Assessment Active Problems ICD-10 Chronic venous hypertension (idiopathic) with ulcer  and inflammation of left lower extremity Non-pressure chronic ulcer of other part of left lower leg with other specified severity Procedures Wound #3 Pre-procedure diagnosis of Wound #3 is a Venous Leg Ulcer located on the Left,Lateral Lower Leg .Severity of Tissue Pre Debridement is: Fat layer exposed. There was a Excisional Skin/Subcutaneous Tissue Debridement with a total area of 33.6 sq cm performed by Ricard Dillon., MD. With the following instrument(s): Curette to remove Viable and Non-Viable tissue/material. Material removed includes Subcutaneous Tissue and Slough and. 1 specimen was taken by a Tissue Culture and sent to the lab per facility protocol. A time out was conducted at 15:37, prior to the start of the procedure. A Moderate amount of bleeding was controlled with Pressure. The procedure was tolerated well with a pain level of 0 throughout and a pain level of 0 following the procedure. Post Debridement Measurements: 4.8cm length x 7cm width x 0.2cm depth; 5.278cm^3 volume. Character of Wound/Ulcer Post Debridement requires further debridement. Severity of Tissue Post Debridement is: Fat layer exposed. Post procedure Diagnosis Wound #3: Same as Pre-Procedure Pre-procedure diagnosis of Wound #3 is a Venous Leg Ulcer located on the Left,Lateral Lower Leg . There was a Four Layer Compression Therapy Procedure by [name], RN. Post procedure Diagnosis Wound #3: Same as Pre-Procedure Plan Follow-up Appointments: Return Appointment in 1 week. - Dr. Dellia Nims Bathing/ Shower/  Hygiene: May shower with protection but do not get wound dressing(s) wet. - Ok to use [employer], can purchase at [facility], [facility], or [employer] Edema Control - Lymphedema / SCD / Other: Elevate legs to the level of the heart or above for 30 minutes daily and/or when sitting, a frequency of: - throughout the day Avoid standing for long periods of time. Exercise regularly Laboratory ordered were: Anerobic  culture - Left lateral lower leg WOUND #3: - Lower Leg Wound Laterality: Left, Lateral Cleanser: Soap and Water 1 x Per Week/ Discharge Instructions: May shower and wash wound with dial antibacterial soap and water prior to dressing change. Cleanser: Wound Cleanser 1 x Per Week/ Discharge Instructions: Cleanse the wound with wound cleanser prior to applying a clean dressing using gauze sponges, not tissue or cotton balls. Peri-Wound Care: Triamcinolone 15 (g) 1 x Per Week/ Discharge Instructions: Use triamcinolone 15 (g) as directed Peri-Wound Care: Zinc Oxide Ointment 30g tube 1 x Per Week/ Discharge Instructions: Apply Zinc Oxide to periwound with each dressing change Peri-Wound Care: Sween Lotion (Moisturizing lotion) 1 x Per Week/ Discharge Instructions: Apply moisturizing lotion as directed Prim Dressing: IODOFLEX 0.9% Cadexomer Iodine Pad 4x6 cm 1 x Per Week/ ary Discharge Instructions: Apply to wound bed as instructed Secondary Dressing: ABD Pad, 8x10 1 x Per Week/ Discharge Instructions: Apply over primary dressing as directed. Secondary Dressing: Zetuvit Plus 4x8 in 1 x Per Week/ Discharge Instructions: Apply over primary dressing as directed. Secondary Dressing: CarboFLEX Odor Control Dressing, 4x4 in 1 x Per Week/ Discharge Instructions: Apply over primary dressing as directed. Com pression Wrap: FourPress (4 layer compression wrap) 1 x Per Week/ Discharge Instructions: Apply four layer compression as directed. May also use Miliken CoFlex 2 layer compression system as alternative. We used Iodoflex to continue on the 2 wounds to aid in debridement and bacteria control 2. Deep tissue culture for PCR. Depending on this I may give her 7 days of a oral antibiotic as well as consider her for a compounded or topical antibiotic. 3 still in 4-layer compression. We have excellent edema control today Electronic Signature(s) Signed: 02/28/2021 4:48:32 PM By: Linton Ham MD Entered By:  Linton Ham on 02/28/2021 16:28:14 -------------------------------------------------------------------------------- SuperBill Details Patient Name: Date of Service: [name] [name]. 02/28/2021 Medical Record Number: [id number] Patient Account Number: [id number] Date of Birth/Sex: Treating RN: 03/25/1955 (66 y.o. [name] Primary Care Provider: [name] Other Clinician: Referring Provider: Treating Provider/Extender: Milus Banister in Treatment: 1 Diagnosis Coding ICD-10 Codes Code Description [phone] Chronic venous hypertension (idiopathic) with ulcer and inflammation of left lower extremity L97.828 Non-pressure chronic ulcer of other part of left lower leg with other specified severity Facility Procedures CPT4 Code: [id number] Description: 94709 - DEB SUBQ TISSUE 20 SQ CM/< ICD-10 Diagnosis Description L97.828 Non-pressure chronic ulcer of other part of left lower leg with other specified Modifier: severity Quantity: 1 Physician Procedures : CPT4 Code Description Modifier [id number] 11042 - WC PHYS SUBQ TISS 20 SQ CM ICD-10 Diagnosis Description L97.828 Non-pressure chronic ulcer of other part of left lower leg with other specified severity Quantity: 1 : [id number] 50354 - WC PHYS SUBQ TISS EA ADDL 20 CM ICD-10 Diagnosis Description L97.828 Non-pressure chronic ulcer of other part of left lower leg with other specified severity Quantity: 1 Electronic Signature(s) Signed: 02/28/2021 4:48:32 PM By: Linton Ham MD Entered By: Linton Ham on 02/28/2021 16:28:30

## 2021-03-02 ENCOUNTER — Other Ambulatory Visit: Payer: Self-pay | Admitting: Internal Medicine

## 2021-03-02 ENCOUNTER — Other Ambulatory Visit (HOSPITAL_COMMUNITY): Payer: Self-pay

## 2021-03-02 MED ORDER — SIMVASTATIN 20 MG PO TABS
20.0000 mg | ORAL_TABLET | Freq: Every day | ORAL | 0 refills | Status: DC
  Filled 2021-03-02: qty 90, 90d supply, fill #0

## 2021-03-06 ENCOUNTER — Encounter: Payer: Self-pay | Admitting: Internal Medicine

## 2021-03-06 ENCOUNTER — Ambulatory Visit (HOSPITAL_COMMUNITY): Payer: 59 | Attending: Internal Medicine

## 2021-03-06 ENCOUNTER — Other Ambulatory Visit: Payer: Self-pay

## 2021-03-06 DIAGNOSIS — E785 Hyperlipidemia, unspecified: Secondary | ICD-10-CM

## 2021-03-06 DIAGNOSIS — I10 Essential (primary) hypertension: Secondary | ICD-10-CM

## 2021-03-06 DIAGNOSIS — R7303 Prediabetes: Secondary | ICD-10-CM | POA: Diagnosis not present

## 2021-03-06 LAB — ECHOCARDIOGRAM COMPLETE
AR max vel: 1.56 cm2
AV Area VTI: 1.81 cm2
AV Area mean vel: 1.66 cm2
AV Mean grad: 9.5 mmHg
AV Peak grad: 18 mmHg
Ao pk vel: 2.12 m/s
Area-P 1/2: 3.01 cm2
S' Lateral: 3 cm

## 2021-03-08 ENCOUNTER — Encounter (HOSPITAL_BASED_OUTPATIENT_CLINIC_OR_DEPARTMENT_OTHER): Payer: 59 | Admitting: Internal Medicine

## 2021-03-08 ENCOUNTER — Other Ambulatory Visit: Payer: Self-pay

## 2021-03-08 DIAGNOSIS — R600 Localized edema: Secondary | ICD-10-CM | POA: Diagnosis not present

## 2021-03-08 DIAGNOSIS — I87332 Chronic venous hypertension (idiopathic) with ulcer and inflammation of left lower extremity: Secondary | ICD-10-CM | POA: Diagnosis not present

## 2021-03-08 DIAGNOSIS — I10 Essential (primary) hypertension: Secondary | ICD-10-CM | POA: Diagnosis not present

## 2021-03-08 DIAGNOSIS — I739 Peripheral vascular disease, unspecified: Secondary | ICD-10-CM | POA: Diagnosis not present

## 2021-03-08 DIAGNOSIS — L03116 Cellulitis of left lower limb: Secondary | ICD-10-CM | POA: Diagnosis not present

## 2021-03-08 DIAGNOSIS — E669 Obesity, unspecified: Secondary | ICD-10-CM | POA: Diagnosis not present

## 2021-03-08 DIAGNOSIS — I872 Venous insufficiency (chronic) (peripheral): Secondary | ICD-10-CM | POA: Diagnosis not present

## 2021-03-08 DIAGNOSIS — L97828 Non-pressure chronic ulcer of other part of left lower leg with other specified severity: Secondary | ICD-10-CM | POA: Diagnosis not present

## 2021-03-08 DIAGNOSIS — L97822 Non-pressure chronic ulcer of other part of left lower leg with fat layer exposed: Secondary | ICD-10-CM | POA: Diagnosis not present

## 2021-03-09 NOTE — Progress Notes (Signed)
LUCITA, MONTOYA ([id number]) Visit Report for 03/08/2021 Arrival Information Details Patient Name: Date of Service: [name], [name] 03/08/2021 2:30 PM Medical Record Number: [id number] Patient Account Number: [id number] Date of Birth/Sex: Treating RN: [birth year] (66 y.o. [name], [name] Primary Care [name]: [name] Other Clinician: Referring [name]: Treating [name]/Extender: [name] in Treatment: 2 Visit Information History Since Last Visit Added or deleted any medications: No Patient Arrived: [name] Any new allergies or adverse reactions: No Arrival Time: 14:29 Had a fall or experienced change in No Accompanied By: self activities of daily living that may affect Transfer Assistance: None risk of falls: Patient Identification Verified: Yes Signs or symptoms of abuse/neglect since last visito No Secondary Verification Process Completed: Yes Hospitalized since last visit: No Patient Requires Transmission-Based Precautions: No Implantable device outside of the clinic excluding No Patient Has Alerts: No cellular tissue based products placed in the center since last visit: Has Dressing in Place as Prescribed: Yes Has Compression in Place as Prescribed: Yes Pain Present Now: No Electronic Signature(s) Signed: 03/09/2021 5:37:29 PM By: Rhae Hammock RN Entered By: Rhae Hammock on 03/08/2021 14:32:26 -------------------------------------------------------------------------------- Compression Therapy Details Patient Name: Date of Service: Erin Face D. 03/08/2021 2:30 PM Medical Record Number: [id number] Patient Account Number: [id number] Date of Birth/Sex: Treating RN: 05-10-1955 (66 y.o. [name], [name] Primary Care [name]: [name] Other Clinician: Referring Liannah Yarbough: Treating [name]/Extender: [name] in Treatment: 2 Compression Therapy Performed for Wound Assessment:  Wound #3 Left,Lateral Lower Leg Performed By: Clinician Rhae Hammock, RN Compression Type: Four Layer Post Procedure Diagnosis Same as Pre-procedure Electronic Signature(s) Signed: 03/09/2021 5:37:29 PM By: Rhae Hammock RN Entered By: Rhae Hammock on 03/08/2021 15:07:24 -------------------------------------------------------------------------------- Encounter Discharge Information Details Patient Name: Date of Service: Erin Gerold [name] 03/08/2021 2:30 PM Medical Record Number: [id number] Patient Account Number: [id number] Date of Birth/Sex: Treating RN: 1955-12-16 (66 y.o. [name], [name] Primary Care [name]: [name] Other Clinician: Referring Milianna Ericsson: Treating [name]/Extender: [name] in Treatment: 2 Encounter Discharge Information Items Post Procedure Vitals Discharge Condition: Stable Temperature (F): 98.7 Ambulatory Status: Cane Pulse (bpm): 74 Discharge Destination: Home Respiratory Rate (breaths/min): 17 Transportation: Private Auto Blood Pressure (mmHg): 134/74 Accompanied By: self Schedule Follow-up Appointment: Yes Clinical Summary of Care: Patient Declined Electronic Signature(s) Signed: 03/09/2021 5:37:29 PM By: Rhae Hammock RN Entered By: Rhae Hammock on 03/08/2021 15:52:25 -------------------------------------------------------------------------------- Lower Extremity Assessment Details Patient Name: Date of Service: Erin Gerold [name] 03/08/2021 2:30 PM Medical Record Number: [id number] Patient Account Number: [id number] Date of Birth/Sex: Treating RN: January 12, 1956 (66 y.o. [name], [name] Primary Care Lanee Chain: [name] Other Clinician: Referring [name]: Treating [name]/Extender: [name] in Treatment: 2 [name]: Yes] [Right: No] Erin: [Left: Ye] [Right: s] Calf Left: Right: Point of Measurement: 29 cm From  Medial Instep 41 cm Ankle Left: Right: Point of Measurement: From Medial Instep 22 cm Vascular Assessment Pulses: Dorsalis Pedis Palpable: [Left:Yes] Posterior Tibial Palpable: [Left:Yes] Electronic Signature(s) Signed: 03/09/2021 5:37:29 PM By: Rhae Hammock RN Entered By: Rhae Hammock on 03/08/2021 14:44:14 -------------------------------------------------------------------------------- Multi Wound Chart Details Patient Name: Date of Service: Erin Face D. 03/08/2021 2:30 PM Medical Record Number: [id number] Patient Account Number: [id number] Date of Birth/Sex: Treating RN: [birth year] (66 y.o. [name], [name] Primary Care [name]: [name] Other Clinician: Referring [name]: Treating [name]/Extender: [name] in Treatment: 2 Vital Signs Height(in): Pulse(bpm): 94 Weight(lbs): Blood Pressure(mmHg): 160/74 Body Mass Index(BMI):  Temperature(F): 97.7 Respiratory Rate(breaths/min): 17 Photos: [N/A:N/A] Left, Lateral Lower Leg N/A N/A Wound Location: Blister N/A N/A Wounding Event: Venous Leg Ulcer N/A N/A Primary Etiology: Cataracts, Lymphedema, N/A N/A Comorbid History: Hypertension, Peripheral Venous Disease, Osteoarthritis 12/13/2020 N/A N/A Date Acquired: 2 N/A N/A Weeks of Treatment: Open N/A N/A Wound Status: No N/A N/A Wound Recurrence: 6x7.5x0.4 N/A N/A Measurements L x W x D (cm) 35.343 N/A N/A A (cm) : rea 14.137 N/A N/A Volume (cm) : -21.60% N/A N/A % Reduction in Area: -143.20% N/A N/A % Reduction in Volume: Full Thickness Without Exposed N/A N/A Classification: Support Structures Large N/A N/A Exudate Amount: Purulent N/A N/A Exudate Type: yellow, brown, green N/A N/A Exudate Color: Yes N/A N/A Foul Odor A Cleansing: fter No N/A N/A Odor Anticipated Due to Product Use: Flat and Intact N/A N/A Wound Margin: Small (1-33%) N/A N/A Granulation A mount: Pink N/A  N/A Granulation Quality: Large (67-100%) N/A N/A Necrotic Amount: Fat Layer (Subcutaneous Tissue): Yes N/A N/A Exposed Structures: Fascia: No Tendon: No Muscle: No Joint: No Bone: No None N/A N/A Epithelialization: Compression Therapy N/A N/A Procedures Performed: Treatment Notes Electronic Signature(s) Signed: 03/08/2021 4:28:28 PM By: Erin Ham MD Signed: 03/09/2021 5:37:29 PM By: Rhae Hammock RN Entered By: [name] on 03/08/2021 15:11:17 -------------------------------------------------------------------------------- Multi-Disciplinary Care Plan Details Patient Name: Date of Service: Erin Face D. 03/08/2021 2:30 PM Medical Record Number: [id number] Patient Account Number: [id number] Date of Birth/Sex: Treating RN: 08-02-1955 (66 y.o. [name], [name] Primary Care Carliss Porcaro: [name] Other Clinician: Referring Kellyjo Edgren: Treating Zannie Runkle/Extender: [name] in Treatment: 2 Multidisciplinary Care Plan reviewed with physician Active Inactive Abuse / Safety / Falls / Self Care Management Nursing Diagnoses: Potential for falls Potential for injury related to falls Goals: Patient will not experience any injury related to falls Date Initiated: 02/21/2021 Target Resolution Date: 03/17/2021 Goal Status: Active Patient/caregiver will verbalize/demonstrate measures taken to prevent injury and/or falls Date Initiated: 02/21/2021 Target Resolution Date: 03/18/2021 Goal Status: Active Interventions: Assess Activities of Daily Living upon admission and as needed Assess fall risk on admission and as needed Assess: immobility, friction, shearing, incontinence upon admission and as needed Assess impairment of mobility on admission and as needed per policy Assess personal safety and home safety (as indicated) on admission and as needed Assess self care needs on admission and as needed Provide education on fall  prevention Provide education on personal and home safety Notes: Venous Leg Ulcer Nursing Diagnoses: Actual venous Insuffiency (use after diagnosis is confirmed) Goals: Patient will maintain optimal Erin control Date Initiated: 02/21/2021 Target Resolution Date: 03/15/2021 Goal Status: Active Patient/caregiver will verbalize understanding of disease process and disease management Date Initiated: 02/21/2021 Target Resolution Date: 03/16/2021 Goal Status: Active Interventions: Assess peripheral Erin status every visit. Compression as ordered Provide education on venous insufficiency Notes: Wound/Skin Impairment Nursing Diagnoses: Impaired tissue integrity Knowledge deficit related to ulceration/compromised skin integrity Goals: Patient/caregiver will verbalize understanding of skin care regimen Date Initiated: 02/21/2021 Target Resolution Date: 03/17/2021 Goal Status: Active Ulcer/skin breakdown will have a volume reduction of 30% by week 4 Date Initiated: 02/21/2021 Target Resolution Date: 03/18/2021 Goal Status: Active Interventions: Assess patient/caregiver ability to obtain necessary supplies Assess patient/caregiver ability to perform ulcer/skin care regimen upon admission and as needed Assess ulceration(s) every visit Provide education on ulcer and skin care Notes: Electronic Signature(s) Signed: 03/09/2021 5:37:29 PM By: Rhae Hammock RN Entered By: Rhae Hammock on 03/08/2021 15:14:30 -------------------------------------------------------------------------------- Pain Assessment Details Patient Name: Date of Service:  [name], Erin [name] 03/08/2021 2:30 PM Medical Record Number: [id number] Patient Account Number: [id number] Date of Birth/Sex: Treating RN: [birth year] (66 y.o. [name], [name] Primary Care Jon Kasparek: [name] Other Clinician: Referring Johnisha Louks: Treating Mc Hollen/Extender: [name] in Treatment: 2 Active  Problems Location of Pain Severity and Description of Pain Patient Has Paino No Site Locations Pain Management and Medication Current Pain Management: Electronic Signature(s) Signed: 03/09/2021 5:37:29 PM By: Rhae Hammock RN Entered By: Rhae Hammock on 03/08/2021 14:36:51 -------------------------------------------------------------------------------- Patient/Caregiver Education Details Patient Name: Date of Service: [name] 1/25/2023andnbsp2:30 PM Medical Record Number: [id number] Patient Account Number: [id number] Date of Birth/Gender: Treating RN: 12/30/1955 (66 y.o. Benjaman Lobe Primary Care Physician: [name] Other Clinician: Referring Physician: Treating Physician/Extender: [name] in Treatment: 2 Education Assessment Education Provided To: Patient Education Topics Provided Wound/Skin Impairment: Methods: Explain/Verbal Responses: Reinforcements needed, State content correctly Electronic Signature(s) Signed: 03/09/2021 5:37:29 PM By: Rhae Hammock RN Entered By: Rhae Hammock on 03/08/2021 15:14:54 -------------------------------------------------------------------------------- Wound Assessment Details Patient Name: Date of Service: Erin Gerold [name] 03/08/2021 2:30 PM Medical Record Number: [id number] Patient Account Number: [id number] Date of Birth/Sex: Treating RN: 25-Jul-1955 (66 y.o. [name], [name] Primary Care Eagle Pitta: [name] Other Clinician: Referring Meggin Ola: Treating Noele Icenhour/Extender: [name] in Treatment: 2 Wound Status Wound Number: 3 Primary Venous Leg Ulcer Etiology: Wound Location: Left, Lateral Lower Leg Wound Open Wounding Event: Blister Status: Date Acquired: 12/13/2020 Comorbid Cataracts, Lymphedema, Hypertension, Peripheral Venous Weeks Of Treatment: 2 History: Disease, Osteoarthritis Clustered Wound: No Photos Wound  Measurements Length: (cm) 6 Width: (cm) 7.5 Depth: (cm) 0.4 Area: (cm) 35.343 Volume: (cm) 14.137 % Reduction in Area: -21.6% % Reduction in Volume: -143.2% Epithelialization: None Tunneling: No Undermining: No Wound Description Classification: Full Thickness Without Exposed Support Structures Wound Margin: Flat and Intact Exudate Amount: Large Exudate Type: Purulent Exudate Color: yellow, brown, green Foul Odor After Cleansing: Yes Due to Product Use: No Slough/Fibrino Yes Wound Bed Granulation Amount: Small (1-33%) Exposed Structure Granulation Quality: Pink Fascia Exposed: No Necrotic Amount: Large (67-100%) Fat Layer (Subcutaneous Tissue) Exposed: Yes Necrotic Quality: Adherent Slough Tendon Exposed: No Muscle Exposed: No Joint Exposed: No Bone Exposed: No Treatment Notes Wound #3 (Lower Leg) Wound Laterality: Left, Lateral Cleanser Soap and Water Discharge Instruction: May shower and wash wound with dial antibacterial soap and water prior to dressing change. Wound Cleanser Discharge Instruction: Cleanse the wound with wound cleanser prior to applying a clean dressing using gauze sponges, not tissue or cotton balls. Peri-Wound Care Triamcinolone 15 (g) Discharge Instruction: Use triamcinolone 15 (g) as directed Zinc Oxide Ointment 30g tube Discharge Instruction: Apply Zinc Oxide to periwound with each dressing change Sween Lotion (Moisturizing lotion) Discharge Instruction: Apply moisturizing lotion as directed Topical Mupirocin Ointment Discharge Instruction: Apply Mupirocin (Bactroban) as instructed Primary Dressing KerraCel Ag Gelling Fiber Dressing, 4x5 in (silver alginate) Discharge Instruction: Apply silver alginate to wound bed as instructed Secondary Dressing ABD Pad, 8x10 Discharge Instruction: Apply over primary dressing as directed. Zetuvit Plus 4x8 in Discharge Instruction: Apply over primary dressing as directed. CarboFLEX Odor Control  Dressing, 4x4 in Discharge Instruction: Apply over primary dressing as directed. Secured With Compression Wrap FourPress (4 layer compression wrap) Discharge Instruction: Apply four layer compression as directed. May also use Miliken CoFlex 2 layer compression system as alternative. Compression Stockings Add-Ons Electronic Signature(s) Signed: 03/09/2021 5:37:29 PM By: Rhae Hammock RN Entered By: Rhae Hammock on 03/08/2021 14:54:20 -------------------------------------------------------------------------------- Vitals Details Patient  Name: Date of Service: [name], [name] 03/08/2021 2:30 PM Medical Record Number: [id number] Patient Account Number: [id number] Date of Birth/Sex: Treating RN: 02/14/1955 (66 y.o. [name], [name] Primary Care Arshi Duarte: [name] Other Clinician: Referring Georgine Wiltse: Treating Carmencita Cusic/Extender: [name] in Treatment: 2 Vital Signs Time Taken: 14:33 Temperature (F): 97.7 Pulse (bpm): 94 Respiratory Rate (breaths/min): 17 Blood Pressure (mmHg): 160/74 Reference Range: 80 - 120 mg / dl Electronic Signature(s) Signed: 03/09/2021 5:37:29 PM By: Rhae Hammock RN Entered By: Rhae Hammock on 03/08/2021 14:36:38

## 2021-03-09 NOTE — Progress Notes (Signed)
TANIA, STEINHAUSER ([id number]) Visit Report for 03/08/2021 Debridement Details Patient Name: Date of Service: AYLAH, YEARY 03/08/2021 2:30 PM Medical Record Number: [id number] Patient Account Number: [id number] Date of Birth/Sex: Treating RN: 11/28/1955 (66 y.o. Tonita Phoenix, Lauren Primary Care Provider: [name] Other Clinician: Referring Provider: Treating Provider/Extender: Milus Banister in Treatment: 2 Debridement Performed for Assessment: Wound #3 Left,Lateral Lower Leg Performed By: Physician Ricard Dillon., MD Debridement Type: Debridement Severity of Tissue Pre Debridement: Fat layer exposed Level of Consciousness (Pre-procedure): Awake and Alert Pre-procedure Verification/Time Out Yes - 15:15 Taken: Start Time: 15:15 Pain Control: Lidocaine T Area Debrided (L x W): otal 6 (cm) x 7.5 (cm) = 45 (cm) Tissue and other material debrided: Viable, Non-Viable, Slough, Subcutaneous, Skin: Dermis , Skin: Epidermis, Slough Level: Skin/Subcutaneous Tissue Debridement Description: Excisional Instrument: Curette Bleeding: Minimum Hemostasis Achieved: Pressure End Time: 15:15 Procedural Pain: 0 Post Procedural Pain: 0 Response to Treatment: Procedure was tolerated well Level of Consciousness (Post- Awake and Alert procedure): Post Debridement Measurements of Total Wound Length: (cm) 6 Width: (cm) 7.5 Depth: (cm) 0.4 Volume: (cm) 14.137 Character of Wound/Ulcer Post Debridement: Improved Severity of Tissue Post Debridement: Fat layer exposed Post Procedure Diagnosis Same as Pre-procedure Electronic Signature(s) Signed: 03/08/2021 4:28:28 PM By: Linton Ham MD Signed: 03/09/2021 5:37:29 PM By: Rhae Hammock RN Entered By: Rhae Hammock on 03/08/2021 15:17:44 -------------------------------------------------------------------------------- HPI Details Patient Name: Date of Service: [name] 03/08/2021 2:30  PM Medical Record Number: [id number] Patient Account Number: [id number] Date of Birth/Sex: Treating RN: 09-04-1955 (66 y.o. 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 She tells [name] that she fell on the gravel driveway in July. She developed injuries on her distal lower leg which have not healed. She saw her primary physician on 11/15/2017 who noted her left shin injuries. Gave her antibiotics. At that point the wounds were almost circumferential however most were less than 1.5 cm. Weeping edema fluid was noted. She was referred here for evaluation. The patient has a history of chronic lower extremity edema. 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 She has had prior history with leg weeping fluid. She does not wear compression stockings. She is not doing anything specific to these wound areas. 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 She also has lymphedema. Put her in 3 layer compression and silver alginate last week. She has 3 small wounds with depth just lateral to the tibia. More problematically than this she has numerous shallow areas some of which are almost canal like in shape with tightly adherent painful debris. It would be very difficult and time- consuming to go through this and attempt to individually debride all these areas.. I  changed her to collagen today to see if that would help with any of the surface debris on some of these wounds. Otherwise we will not be able to put this in compression we have to have someone change the  dressing. The patient is not eligible for home health 12/25/17 on evaluation today patient actually appears to be doing rather well in regard to the ulcer on her lower extremity. Fortunately there does not appear to be evidence of infection at this time. She has been tolerating the dressing changes without complication. This includes the compression wrap. The only issue she had was that the wrap was initially placed over her bunion region which actually calls her some discomfort and pain. Other than that things seem to be going rather well. 01/01/2018 Seen today for follow-up and management of left lower extremity wound and lymphedema. T oday she presents with a new wound towards to the left lateral LE. Recently treated with a 7 day course of amoxicillin; reason for antibiotic dose is unknown at this time. Tolerating current treatment of collagen with 4- layer wraps. She obtained a venous reflux study on 12/25/17. Studies show on the right abnormal reflux times of the popliteal vein, great saphenous vein at the saphenofemoral junction at the proximal thigh, great saphenous vein at the mid calf, and origin of the small saphenous vein.No superficial thrombosis. No deep vein thrombosis in the common femoral, femoral,and popliteal veins. Left abnormal reflex times as well of the common femoral vein, popliteal vein, and a great saphenous vein at the saphenofemoral junction, In great saphenous vein at the mid thigh w/o thrombosis. Has any issues or concerns during visit today. Recommended follow-up to vascular specialist due to abnormalities from the venous reflux study. Denies fever, pain, chills, dizziness, nausea, or vomiting. 01/08/18 upon evaluation today the patient actually seems to be showing some  signs of improvement in my opinion at this point in regard to the lower extremity ulcerated areas. She still has a lot of drainage but fortunately nothing that appears to be too significant currently. I have been very happy with the overall progress I see today compared to where things were during the last evaluation that I had with her. Nonetheless she has not had her appointment with the vein specialist as of yet in fact we were able to get this approved for her today and confirmed with them she will be seeing them on December 26. Nonetheless in general I do feel like the compression wraps is doing well for her. 01/17/18; quite a bit of improvement since last time I saw this patient she has a small open area remaining on the left lateral calf and even smaller area medially. She has lymphedema chronic stasis changes with distal skin fibrosis. She has an appointment with vascular surgery later this month 01/24/2018; the patient's medial leg has closed. Still a small open area on the left lateral leg. She states that the 4 layer compression we put on last week was too tight and she had to take it off over a few days ago. We have had resultant increase in her lymphedema in the dorsal foot and a proximal calf. Fortunately that does not seem to have resulted in any deterioration in her wounds The patient is going to need compression stockings. We have given her measurements to phone elastic therapy in [place]. She has vascular surgery consult on December 26 02/07/18; she's had continuous contraction on the left lateral leg wound which is now very small. Currently using silver alginate under 3 layer compression She saw Dr. Trula Slade of vascular surgery on 02/06/18. It was noted that she had a normal reflux times in the popliteal vein, great saphenous vein at the saphenofemoral junction, great saphenous vein at  the proximal thigh great saphenous vein at the mid calf and origin of the small saphenous vein. It  was noted that she had significant reflux in the left saphenous veins with diameter measurements in the 0.7-0.8 cm range. It was felt she would benefit from laser ablation to help minimize the risk of ulcer recurrence. It was recommended that she wear 20-30 thigh-high compression stockings and have follow-up in 4-6 weeks 02/17/2018; I thought this lady would be healed however her compression slipped down and she developed increasing swelling and the wound is actually larger. 1/13; we had deterioration last week after the patient's compression slipped down and she developed periwound swelling. I increased her compression before layers we have been using silver alginate we are a lot better again today. She has her compression stockings in waiting 1/23; the patient's wounds are totally healed today. She has her stockings. This was almost circumferential skin damage. She follows up with Dr. Trula Slade of vascular surgery next Monday. READMISSION 02/21/2021 This is a now 66 year old woman that we had in clinic here discharging in January 2020 with wounds on her left calf chronic venous insufficiency. She was discharged with 30/40 stockings. It does not sound like she has worn stockings in about a year largely from not being able to get them on herself. In November she developed new blisters on her legs an area laterally is opened into a fairly sizable wound. She has weeping posteriorly as well. She has been using Neosporin and Band-Aids. The patient did see Dr. Trula Slade in 2019 and 2020. He felt she might benefit from laser ablation in her left saphenous veins although because of the wound that healed I do not think he went through with it. She might benefit from seeing him again. Her ABI on the left is 1. 1/17; patient's wound on the posterior left calf is closed she has the 2 large areas last week. We put her in 4-layer compression for edema control is a lot better. Our intake nurse noted greenish drainage  and odor. We have been using silver alginate 1/25; PCR culture I did have the substantial wound area on the left lateral lower leg showed staff aureus and group A strep. Low titers of coag negative staph which are probably skin contaminants. Resistance detected to tetracycline methicillin and macrolides. I gave her a starter kit of Nuzyra 150 mg x 3 for 2 days then 300 mg for a further 7 days. Marked odor considerable increase in surrounding erythema. We are using Iodoflex last week however I have changed to silver alginate with underlying Bactroban Electronic Signature(s) Signed: 03/08/2021 4:28:28 PM By: Linton Ham MD Entered By: Linton Ham on 03/08/2021 15:26:14 -------------------------------------------------------------------------------- Physical Exam Details Patient Name: Date of Service: [name] 03/08/2021 2:30 PM Medical Record Number: [id number] Patient Account Number: [id number] Date of Birth/Sex: Treating RN: 1955-07-13 (66 y.o. Benjaman Lobe Primary Care Provider: [name] Other Clinician: Referring Provider: Treating Provider/Extender: Milus Banister in Treatment: 2 Constitutional Patient is hypertensive.. Pulse regular and within target range for patient.Marland Kitchen Respirations regular, non-labored and within target range.. Temperature is normal and within the target range for the patient.Marland Kitchen Appears in no distress. Cardiovascular Pedal pulses are palpable. We have good edema control. Notes Wound exam; large area on the left lateral calf. Still a completely necrotic surface. Malodorous this week with significant surrounding erythema that I have marked. I used an open curette for an aggressive debridement I am still not been able to get  to a completely viable surface. Electronic Signature(s) Signed: 03/08/2021 4:28:28 PM By: Linton Ham MD Entered By: Linton Ham on 03/08/2021  15:27:40 -------------------------------------------------------------------------------- Physician Orders Details Patient Name: Date of Service: [name] 03/08/2021 2:30 PM Medical Record Number: [id number] Patient Account Number: [id number] Date of Birth/Sex: Treating RN: Nov 03, 1955 (66 y.o. Tonita Phoenix, Lauren Primary Care Provider: [name] Other Clinician: Referring Provider: Treating Provider/Extender: Milus Banister in Treatment: 2 Verbal / Phone Orders: No Diagnosis Coding Follow-up Appointments ppointment in 1 week. - Dr. Dellia Nims Return A Nurse Visit: - Monday!!! Other: - Start taking the [place] today as prescribed. Bathing/ Shower/ Hygiene May shower with protection but do not get wound dressing(s) wet. - Ok to use [employer], can purchase at [facility], [facility], or [employer] Edema Control - Lymphedema / SCD / Other Elevate legs to the level of the heart or above for 30 minutes daily and/or when sitting, a frequency of: - throughout the day Avoid standing for long periods of time. Exercise regularly Wound Treatment Wound #3 - Lower Leg Wound Laterality: Left, Lateral Cleanser: Soap and Water 1 x Per Week Discharge Instructions: May shower and wash wound with dial antibacterial soap and water prior to dressing change. Cleanser: Wound Cleanser 1 x Per Week Discharge Instructions: Cleanse the wound with wound cleanser prior to applying a clean dressing using gauze sponges, not tissue or cotton balls. Peri-Wound Care: Triamcinolone 15 (g) 1 x Per Week Discharge Instructions: Use triamcinolone 15 (g) as directed Peri-Wound Care: Zinc Oxide Ointment 30g tube 1 x Per Week Discharge Instructions: Apply Zinc Oxide to periwound with each dressing change Peri-Wound Care: Sween Lotion (Moisturizing lotion) 1 x Per Week Discharge Instructions: Apply moisturizing lotion as directed Topical: Mupirocin Ointment 1 x Per Week Discharge Instructions:  Apply Mupirocin (Bactroban) as instructed Prim Dressing: KerraCel Ag Gelling Fiber Dressing, 4x5 in (silver alginate) 1 x Per Week ary Discharge Instructions: Apply silver alginate to wound bed as instructed Secondary Dressing: ABD Pad, 8x10 1 x Per Week Discharge Instructions: Apply over primary dressing as directed. Secondary Dressing: Zetuvit Plus 4x8 in 1 x Per Week Discharge Instructions: Apply over primary dressing as directed. Secondary Dressing: CarboFLEX Odor Control Dressing, 4x4 in 1 x Per Week Discharge Instructions: Apply over primary dressing as directed. Compression Wrap: FourPress (4 layer compression wrap) 1 x Per Week Discharge Instructions: Apply four layer compression as directed. May also use Miliken CoFlex 2 layer compression system as alternative. Patient Medications llergies: No Known Drug Allergies A Notifications Medication Indication Start End Nuzyra (7 Day with Load Dose) DOSE oral 150 mg tablet - 2 tablet oral PO daily x 7 days Electronic Signature(s) Signed: 03/08/2021 4:28:28 PM By: Linton Ham MD Signed: 03/09/2021 5:37:29 PM By: Rhae Hammock RN Entered By: Rhae Hammock on 03/08/2021 15:46:27 Prescription 03/08/2021 -------------------------------------------------------------------------------- Derrill Memo D. Linton Ham MD Patient Name: Provider: [birth year] [id number] Date of Birth: NPI#: [id number] Sex: DEA #: [id number] Phone #: License #: [place] Patient Address: [address], [place] [address] [place], [place] [address] [phone] Allergies No Known Drug Allergies Medication Medication: Route: Strength: Form: Nuzyra (7 Day with Load Dose) oral 150 mg tablet Class: TETRACYCLINE ANTIBIOTICS Dose: Frequency / Time: Indication: 2 tablet oral PO daily x 7 days Number of Refills: Number of Units: 0 Generic  Substitution: Start Date: End Date: Administered at Facility: Substitution Permitted No Note to Pharmacy: Hand Signature: Date(s):  Electronic Signature(s) Signed: 03/08/2021 4:28:28 PM By: Linton Ham MD Signed: 03/09/2021 5:37:29 PM By: Rhae Hammock RN Entered By: Rhae Hammock on 03/08/2021 15:46:28 -------------------------------------------------------------------------------- Problem List Details Patient Name: Date of Service: [name] 03/08/2021 2:30 PM Medical Record Number: [id number] Patient Account Number: [id number] Date of Birth/Sex: Treating RN: [birth year] (66 y.o. Tonita Phoenix, Lauren Primary Care Provider: [name] Other Clinician: Referring Provider: Treating Provider/Extender: Milus Banister in Treatment: 2 Active Problems ICD-10 Encounter Code Description Active Date MDM Diagnosis 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 (idiopathic) with ulcer and inflammation of left 02/21/2021 No Yes lower extremity L97.828 Non-pressure chronic ulcer of other part of left lower leg with other specified 02/21/2021 No Yes severity L03.116 Cellulitis of left lower limb 03/08/2021 No Yes Inactive Problems Resolved Problems Electronic Signature(s) Signed: 03/08/2021 4:28:28 PM By: Linton Ham MD Entered By: Linton Ham on 03/08/2021 15:11:09 -------------------------------------------------------------------------------- Progress Note Details Patient Name: Date of Service: [name] 03/08/2021 2:30 PM Medical Record Number: [id number] Patient Account Number: [id number] Date of Birth/Sex: Treating RN: 1955-11-07 (66 y.o. 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 She tells [name] that she fell on the gravel driveway in July. She developed injuries on her distal lower leg which have not healed. She saw her primary physician on 11/15/2017 who noted her left shin injuries. Gave her antibiotics. At that point the wounds were almost circumferential however most were less than 1.5 cm. Weeping edema fluid was noted. She was referred here for evaluation. The patient has a history of chronic lower extremity edema. 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 She has had prior history with leg weeping fluid. She does not wear compression stockings. She is not doing anything specific to these wound areas. 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 She also has lymphedema. Put her in 3 layer compression and silver alginate last week. She has 3 small wounds with depth just lateral to the tibia. More problematically than this she has numerous shallow areas some of which are almost canal like in shape with tightly adherent painful debris. It would be very difficult and time- consuming to go through this and attempt to individually debride all these areas.. I changed her to collagen today to see if that would help with any of the surface debris on some of these wounds. Otherwise we will not be able to put this in compression we have to have someone change the dressing. The patient is not eligible for home health 12/25/17 on evaluation today patient actually appears to be doing rather well in regard to the ulcer on her lower extremity. Fortunately there does not appear  to be evidence of infection at this time. She has been tolerating the dressing changes without complication. This includes the compression wrap. The only issue she had was that the wrap was initially placed over her bunion region which actually calls her some discomfort and pain. Other than that  things seem to be going rather well. 01/01/2018 Seen today for follow-up and management of left lower extremity wound and lymphedema. T oday she presents with a new wound towards to the left lateral LE. Recently treated with a 7 day course of amoxicillin; reason for antibiotic dose is unknown at this time. Tolerating current treatment of collagen with 4- layer wraps. She obtained a venous reflux study on 12/25/17. Studies show on the right abnormal reflux times of the popliteal vein, great saphenous vein at the saphenofemoral junction at the proximal thigh, great saphenous vein at the mid calf, and origin of the small saphenous vein.No superficial thrombosis. No deep vein thrombosis in the common femoral, femoral,and popliteal veins. Left abnormal reflex times as well of the common femoral vein, popliteal vein, and a great saphenous vein at the saphenofemoral junction, In great saphenous vein at the mid thigh w/o thrombosis. Has any issues or concerns during visit today. Recommended follow-up to vascular specialist due to abnormalities from the venous reflux study. Denies fever, pain, chills, dizziness, nausea, or vomiting. 01/08/18 upon evaluation today the patient actually seems to be showing some signs of improvement in my opinion at this point in regard to the lower extremity ulcerated areas. She still has a lot of drainage but fortunately nothing that appears to be too significant currently. I have been very happy with the overall progress I see today compared to where things were during the last evaluation that I had with her. Nonetheless she has not had her appointment with the vein specialist as of yet  in fact we were able to get this approved for her today and confirmed with them she will be seeing them on December 26. Nonetheless in general I do feel like the compression wraps is doing well for her. 01/17/18; quite a bit of improvement since last time I saw this patient she has a small open area remaining on the left lateral calf and even smaller area medially. She has lymphedema chronic stasis changes with distal skin fibrosis. She has an appointment with vascular surgery later this month 01/24/2018; the patient's medial leg has closed. Still a small open area on the left lateral leg. She states that the 4 layer compression we put on last week was too tight and she had to take it off over a few days ago. We have had resultant increase in her lymphedema in the dorsal foot and a proximal calf. Fortunately that does not seem to have resulted in any deterioration in her wounds The patient is going to need compression stockings. We have given her measurements to phone elastic therapy in [place]. She has vascular surgery consult on December 26 02/07/18; she's had continuous contraction on the left lateral leg wound which is now very small. Currently using silver alginate under 3 layer compression She saw Dr. Trula Slade of vascular surgery on 02/06/18. It was noted that she had a normal reflux times in the popliteal vein, great saphenous vein at the saphenofemoral junction, great saphenous vein at the proximal thigh great saphenous vein at the mid calf and origin of the small saphenous vein. It was noted that she had significant reflux in the left saphenous veins with diameter measurements in the 0.7-0.8 cm range. It was felt she would benefit from laser ablation to help minimize the risk of ulcer recurrence. It was recommended that she wear 20-30 thigh-high compression stockings and have follow-up in 4-6 weeks 02/17/2018; I thought this lady would be healed however her compression slipped down  and she  developed increasing swelling and the wound is actually larger. 1/13; we had deterioration last week after the patient's compression slipped down and she developed periwound swelling. I increased her compression before layers we have been using silver alginate we are a lot better again today. She has her compression stockings in waiting 1/23; the patient's wounds are totally healed today. She has her stockings. This was almost circumferential skin damage. She follows up with Dr. Trula Slade of vascular surgery next Monday. READMISSION 02/21/2021 This is a now 66 year old woman that we had in clinic here discharging in January 2020 with wounds on her left calf chronic venous insufficiency. She was discharged with 30/40 stockings. It does not sound like she has worn stockings in about a year largely from not being able to get them on herself. In November she developed new blisters on her legs an area laterally is opened into a fairly sizable wound. She has weeping posteriorly as well. She has been using Neosporin and Band-Aids. The patient did see Dr. Trula Slade in 2019 and 2020. He felt she might benefit from laser ablation in her left saphenous veins although because of the wound that healed I do not think he went through with it. She might benefit from seeing him again. Her ABI on the left is 1. 1/17; patient's wound on the posterior left calf is closed she has the 2 large areas last week. We put her in 4-layer compression for edema control is a lot better. Our intake nurse noted greenish drainage and odor. We have been using silver alginate 1/25; PCR culture I did have the substantial wound area on the left lateral lower leg showed staff aureus and group A strep. Low titers of coag negative staph which are probably skin contaminants. Resistance detected to tetracycline methicillin and macrolides. I gave her a starter kit of Nuzyra 150 mg x 3 for 2 days then 300 mg for a further 7 days. Marked odor  considerable increase in surrounding erythema. We are using Iodoflex last week however I have changed to silver alginate with underlying Bactroban Objective Constitutional Patient is hypertensive.. Pulse regular and within target range for patient.Marland Kitchen Respirations regular, non-labored and within target range.. Temperature is normal and within the target range for the patient.Marland Kitchen Appears in no distress. Vitals Time Taken: 2:33 PM, Temperature: 97.7 F, Pulse: 94 bpm, Respiratory Rate: 17 breaths/min, Blood Pressure: 160/74 mmHg. Cardiovascular Pedal pulses are palpable. We have good edema control. General Notes: Wound exam; large area on the left lateral calf. Still a completely necrotic surface. Malodorous this week with significant surrounding erythema that I have marked. I used an open curette for an aggressive debridement I am still not been able to get to a completely viable surface. Integumentary (Hair, Skin) Wound #3 status is Open. Original cause of wound was Blister. The date acquired was: 12/13/2020. The wound has been in treatment 2 weeks. The wound is located on the Left,Lateral Lower Leg. The wound measures 6cm length x 7.5cm width x 0.4cm depth; 35.343cm^2 area and 14.137cm^3 volume. There is Fat Layer (Subcutaneous Tissue) exposed. There is no tunneling or undermining noted. There is a large amount of purulent drainage noted. Foul odor after cleansing was noted. The wound margin is flat and intact. There is small (1-33%) pink granulation within the wound bed. There is a large (67-100%) amount of necrotic tissue within the wound bed including Adherent Slough. Assessment Active Problems ICD-10 Chronic venous hypertension (idiopathic) with ulcer and inflammation of left lower  extremity Non-pressure chronic ulcer of other part of left lower leg with other specified severity Cellulitis of left lower limb Procedures Wound #3 Pre-procedure diagnosis of Wound #3 is a Venous Leg Ulcer  located on the Left,Lateral Lower Leg .Severity of Tissue Pre Debridement is: Fat layer exposed. There was a Excisional Skin/Subcutaneous Tissue Debridement with a total area of 45 sq cm performed by Ricard Dillon., MD. With the following instrument(s): Curette to remove Viable and Non-Viable tissue/material. Material removed includes Subcutaneous Tissue, Slough, Skin: Dermis, and Skin: Epidermis after achieving pain control using Lidocaine. No specimens were taken. A time out was conducted at 15:15, prior to the start of the procedure. A Minimum amount of bleeding was controlled with Pressure. The procedure was tolerated well with a pain level of 0 throughout and a pain level of 0 following the procedure. Post Debridement Measurements: 6cm length x 7.5cm width x 0.4cm depth; 14.137cm^3 volume. Character of Wound/Ulcer Post Debridement is improved. Severity of Tissue Post Debridement is: Fat layer exposed. Post procedure Diagnosis Wound #3: Same as Pre-Procedure Pre-procedure diagnosis of Wound #3 is a Venous Leg Ulcer located on the Left,Lateral Lower Leg . There was a Four Layer Compression Therapy Procedure by Rhae Hammock, RN. Post procedure Diagnosis Wound #3: Same as Pre-Procedure Plan Follow-up Appointments: Return Appointment in 1 week. - Dr. Dellia Nims Bathing/ Shower/ Hygiene: May shower with protection but do not get wound dressing(s) wet. - Ok to use [employer], can purchase at [facility], [facility], or [employer] Edema Control - Lymphedema / SCD / Other: Elevate legs to the level of the heart or above for 30 minutes daily and/or when sitting, a frequency of: - throughout the day Avoid standing for long periods of time. Exercise regularly WOUND #3: - Lower Leg Wound Laterality: Left, Lateral Cleanser: Soap and Water 1 x Per Week/ Discharge Instructions: May shower and wash wound with dial antibacterial soap and water prior to dressing change. Cleanser: Wound Cleanser 1 x Per  Week/ Discharge Instructions: Cleanse the wound with wound cleanser prior to applying a clean dressing using gauze sponges, not tissue or cotton balls. Peri-Wound Care: Triamcinolone 15 (g) 1 x Per Week/ Discharge Instructions: Use triamcinolone 15 (g) as directed Peri-Wound Care: Zinc Oxide Ointment 30g tube 1 x Per Week/ Discharge Instructions: Apply Zinc Oxide to periwound with each dressing change Peri-Wound Care: Sween Lotion (Moisturizing lotion) 1 x Per Week/ Discharge Instructions: Apply moisturizing lotion as directed Prim Dressing: IODOFLEX 0.9% Cadexomer Iodine Pad 4x6 cm 1 x Per Week/ ary Discharge Instructions: Apply to wound bed as instructed Secondary Dressing: ABD Pad, 8x10 1 x Per Week/ Discharge Instructions: Apply over primary dressing as directed. Secondary Dressing: Zetuvit Plus 4x8 in 1 x Per Week/ Discharge Instructions: Apply over primary dressing as directed. Secondary Dressing: CarboFLEX Odor Control Dressing, 4x4 in 1 x Per Week/ Discharge Instructions: Apply over primary dressing as directed. Compression Wrap: FourPress (4 layer compression wrap) 1 x Per Week/ Discharge Instructions: Apply four layer compression as directed. May also use Miliken CoFlex 2 layer compression system as alternative. 1. Periwound cellulitis in the left lateral calf I have marked this area. I am concerned 2. Change the primary dressing to silver alginate with underlying Bactroban to the wound surface. 3. Nuzyra 450 mg today and tomorrow then 300 mg for a further 7 days. 4. We put her back in compression however I think she is going to need to have a nurse visit either Friday or Monday to check on the  progress of the wound and the infection. I will see her back in 1 week in the meantime I have asked her to seek medical attention urgently if the pain becomes a lot worse or she becomes systemically unwell Electronic Signature(s) Signed: 03/08/2021 4:28:28 PM By: Linton Ham MD Entered  By: Linton Ham on 03/08/2021 15:29:25 -------------------------------------------------------------------------------- SuperBill Details Patient Name: Date of Service: [name] 03/08/2021 Medical Record Number: [id number] Patient Account Number: [id number] Date of Birth/Sex: Treating RN: 1955-05-12 (66 y.o. Tonita Phoenix, Lauren Primary Care Provider: [name] Other Clinician: Referring Provider: Treating Provider/Extender: Milus Banister in Treatment: 2 Diagnosis Coding ICD-10 Codes Code Description [phone] Chronic venous hypertension (idiopathic) with ulcer and inflammation of left lower extremity L97.828 Non-pressure chronic ulcer of other part of left lower leg with other specified severity L03.116 Cellulitis of left lower limb Facility Procedures CPT4 Code: [id number] Description: 28315 - DEB SUBQ TISSUE 20 SQ CM/< ICD-10 Diagnosis Description L97.828 Non-pressure chronic ulcer of other part of left lower leg with other specified Modifier: severity Quantity: 1 CPT4 Code: 17616073 Description: 71062 - DEB SUBQ TISS EA ADDL 20CM ICD-10 Diagnosis Description L97.828 Non-pressure chronic ulcer of other part of left lower leg with other specified Modifier: severity Quantity: 2 Physician Procedures : CPT4 Code Description Modifier [id number] 11042 - WC PHYS SUBQ TISS 20 SQ CM ICD-10 Diagnosis Description L97.828 Non-pressure chronic ulcer of other part of left lower leg with other specified severity Quantity: 1 : [id number] 93818 - WC PHYS SUBQ TISS EA ADDL 20 CM ICD-10 Diagnosis Description L97.828 Non-pressure chronic ulcer of other part of left lower leg with other specified severity Quantity: 2 Electronic Signature(s) Signed: 03/08/2021 4:28:28 PM By: Linton Ham MD Signed: 03/09/2021 5:37:29 PM By: Rhae Hammock RN Entered By: Rhae Hammock on 03/08/2021 15:49:36

## 2021-03-13 ENCOUNTER — Encounter (HOSPITAL_BASED_OUTPATIENT_CLINIC_OR_DEPARTMENT_OTHER): Payer: 59 | Admitting: Internal Medicine

## 2021-03-13 ENCOUNTER — Other Ambulatory Visit: Payer: Self-pay

## 2021-03-13 DIAGNOSIS — E669 Obesity, unspecified: Secondary | ICD-10-CM | POA: Diagnosis not present

## 2021-03-13 DIAGNOSIS — L03116 Cellulitis of left lower limb: Secondary | ICD-10-CM | POA: Diagnosis not present

## 2021-03-13 DIAGNOSIS — R600 Localized edema: Secondary | ICD-10-CM | POA: Diagnosis not present

## 2021-03-13 DIAGNOSIS — I739 Peripheral vascular disease, unspecified: Secondary | ICD-10-CM | POA: Diagnosis not present

## 2021-03-13 DIAGNOSIS — I10 Essential (primary) hypertension: Secondary | ICD-10-CM | POA: Diagnosis not present

## 2021-03-13 DIAGNOSIS — L97828 Non-pressure chronic ulcer of other part of left lower leg with other specified severity: Secondary | ICD-10-CM | POA: Diagnosis not present

## 2021-03-13 DIAGNOSIS — I87332 Chronic venous hypertension (idiopathic) with ulcer and inflammation of left lower extremity: Secondary | ICD-10-CM | POA: Diagnosis not present

## 2021-03-13 NOTE — Progress Notes (Signed)
SHAKIRRA, BUEHLER ([id number]) Visit Report for 03/13/2021 SuperBill Details Patient Name: Date of Service: [name], [name] 03/13/2021 Medical Record Number: [id number] Patient Account Number: [id number] Date of Birth/Sex: Treating RN: 09/02/1955 (66 y.o. [name] Primary Care Provider: [name] Other Clinician: Referring Provider: Treating Provider/Extender: Alcario Drought in Treatment: 2 Diagnosis Coding ICD-10 Codes Code Description [phone] Chronic venous hypertension (idiopathic) with ulcer and inflammation of left lower extremity L97.828 Non-pressure chronic ulcer of other part of left lower leg with other specified severity L03.116 Cellulitis of left lower limb Facility Procedures CPT4 Code Description Modifier Quantity [id number] (Facility Use Only) [phone] - [place] 1 Electronic Signature(s) Signed: 03/13/2021 12:27:20 PM By: Kalman Shan DO Signed: 03/13/2021 5:33:54 PM By: Deon Pilling RN, BSN Entered By: Deon Pilling on 03/13/2021 00:92:33

## 2021-03-13 NOTE — Progress Notes (Signed)
SASHAY, FELLING ([id number]) Visit Report for 03/13/2021 Arrival Information Details Patient Name: Date of Service: [name] 03/13/2021 9:00 A M Medical Record Number: [id number] Patient Account Number: [id number] Date of Birth/Sex: Treating RN: Oct 07, 1955 (66 y.o. Helene Shoe, Tammi Klippel Primary Care Laressa Bolinger: [name] Other Clinician: Referring Ivelisse Culverhouse: Treating Jeri Jeanbaptiste/Extender: Alcario Drought in Treatment: 2 Visit Information History Since Last Visit Added or deleted any medications: No Patient Arrived: [name] Any new allergies or adverse reactions: No Arrival Time: 09:05 Had a fall or experienced change in No Accompanied By: self activities of daily living that may affect Transfer Assistance: None risk of falls: Patient Identification Verified: Yes Signs or symptoms of abuse/neglect since last visito No Secondary Verification Process Completed: Yes Hospitalized since last visit: No Patient Requires Transmission-Based Precautions: No Implantable device outside of the clinic excluding No Patient Has Alerts: No cellular tissue based products placed in the center since last visit: Has Dressing in Place as Prescribed: Yes Has Compression in Place as Prescribed: Yes Pain Present Now: No Electronic Signature(s) Signed: 03/13/2021 5:33:54 PM By: Deon Pilling RN, BSN Entered By: Deon Pilling on 03/13/2021 09:26:34 -------------------------------------------------------------------------------- Compression Therapy Details Patient Name: Date of Service: [name] 03/13/2021 9:00 A M Medical Record Number: [id number] Patient Account Number: [id number] Date of Birth/Sex: Treating RN: 03/26/1955 (66 y.o. [name] Primary Care Lewayne Pauley: [name] Other Clinician: Referring Madhuri Vacca: Treating Tyleek Smick/Extender: Alcario Drought in Treatment: 2 Compression Therapy Performed for Wound Assessment: Wound #3  Left,Lateral Lower Leg Performed By: Clinician Deon Pilling, RN Compression Type: Four Layer Electronic Signature(s) Signed: 03/13/2021 5:33:54 PM By: Deon Pilling RN, BSN Entered By: Deon Pilling on 03/13/2021 09:27:24 -------------------------------------------------------------------------------- Encounter Discharge Information Details Patient Name: Date of Service: Erin Gerold [name] 03/13/2021 9:00 A M Medical Record Number: [id number] Patient Account Number: [id number] Date of Birth/Sex: Treating RN: 06-12-1955 (66 y.o. [name] Primary Care Preciliano Castell: [name] Other Clinician: Referring Haynes Giannotti: Treating Lianne Carreto/Extender: Alcario Drought in Treatment: 2 Encounter Discharge Information Items Discharge Condition: Stable Ambulatory Status: Cane Discharge Destination: Home Transportation: Private Auto Accompanied By: self Schedule Follow-up Appointment: Yes Clinical Summary of Care: Electronic Signature(s) Signed: 03/13/2021 5:33:54 PM By: Deon Pilling RN, BSN Entered By: Deon Pilling on 03/13/2021 09:28:01 -------------------------------------------------------------------------------- Patient/Caregiver Education Details Patient Name: Date of Service: [name] 1/30/2023andnbsp9:00 [place] Record Number: [id number] Patient Account Number: [id number] Date of Birth/Gender: Treating RN: February 03, 1956 (66 y.o. [name] Primary Care Physician: [name] Other Clinician: Referring Physician: Treating Physician/Extender: Alcario Drought in Treatment: 2 Education Assessment Education Provided To: Patient Education Topics Provided Wound/Skin Impairment: Handouts: Skin Care Do's and Dont's Methods: Explain/Verbal Responses: Reinforcements needed Electronic Signature(s) Signed: 03/13/2021 5:33:54 PM By: Deon Pilling RN, BSN Entered By: Deon Pilling on 03/13/2021  09:27:51 -------------------------------------------------------------------------------- Wound Assessment Details Patient Name: Date of Service: Erin Gerold [name] 03/13/2021 9:00 A M Medical Record Number: [id number] Patient Account Number: [id number] Date of Birth/Sex: Treating RN: [birth year] (66 y.o. [name] Primary Care Margy Sumler: [name] Other Clinician: Referring Nicolle Heward: Treating Mathis Cashman/Extender: Alcario Drought in Treatment: 2 Wound Status Wound Number: 3 Primary Venous Leg Ulcer Etiology: Wound Location: Left, Lateral Lower Leg Wound Open Wounding Event: Blister Status: Date Acquired: 12/13/2020 Comorbid Cataracts, Lymphedema, Hypertension, Peripheral Venous Weeks Of Treatment: 2 History: Disease, Osteoarthritis Clustered Wound: No Wound Measurements Length: (cm) 6 Width: (cm) 7.5 Depth: (cm) 0.4 Area: (cm) 35.343 Volume: (  cm) 14.137 % Reduction in Area: -21.6% % Reduction in Volume: -143.2% Epithelialization: Small (1-33%) Tunneling: No Undermining: No Wound Description Classification: Full Thickness Without Exposed Support Structures Wound Margin: Flat and Intact Exudate Amount: Large Exudate Type: Serosanguineous Exudate Color: red, brown Foul Odor After Cleansing: No Slough/Fibrino Yes Wound Bed Granulation Amount: Small (1-33%) Exposed Structure Granulation Quality: Pink Fascia Exposed: No Necrotic Amount: Large (67-100%) Fat Layer (Subcutaneous Tissue) Exposed: Yes Necrotic Quality: Adherent Slough Tendon Exposed: No Muscle Exposed: No Joint Exposed: No Bone Exposed: No Treatment Notes Wound #3 (Lower Leg) Wound Laterality: Left, Lateral Cleanser Soap and Water Discharge Instruction: May shower and wash wound with dial antibacterial soap and water prior to dressing change. Wound Cleanser Discharge Instruction: Cleanse the wound with wound cleanser prior to applying a clean dressing using gauze  sponges, not tissue or cotton balls. Peri-Wound Care Triamcinolone 15 (g) Discharge Instruction: Use triamcinolone 15 (g) as directed Zinc Oxide Ointment 30g tube Discharge Instruction: Apply Zinc Oxide to periwound with each dressing change Sween Lotion (Moisturizing lotion) Discharge Instruction: Apply moisturizing lotion as directed Topical Mupirocin Ointment Discharge Instruction: Apply Mupirocin (Bactroban) as instructed Primary Dressing KerraCel Ag Gelling Fiber Dressing, 4x5 in (silver alginate) Discharge Instruction: Apply silver alginate to wound bed as instructed Secondary Dressing ABD Pad, 8x10 Discharge Instruction: Apply over primary dressing as directed. Zetuvit Plus 4x8 in Discharge Instruction: Apply over primary dressing as directed. CarboFLEX Odor Control Dressing, 4x4 in Discharge Instruction: Apply over primary dressing as directed. Secured With Compression Wrap FourPress (4 layer compression wrap) Discharge Instruction: Apply four layer compression as directed. May also use Miliken CoFlex 2 layer compression system as alternative. Compression Stockings Add-Ons Electronic Signature(s) Signed: 03/13/2021 5:33:54 PM By: Deon Pilling RN, BSN Entered By: Deon Pilling on 03/13/2021 09:27:08 -------------------------------------------------------------------------------- Vitals Details Patient Name: Date of Service: Erin Gerold [name] 03/13/2021 9:00 A M Medical Record Number: [id number] Patient Account Number: [id number] Date of Birth/Sex: Treating RN: Jan 27, 1956 (66 y.o. [name] Primary Care Vaudine Dutan: [name] Other Clinician: Referring Curtistine Pettitt: Treating Idamae Coccia/Extender: Alcario Drought in Treatment: 2 Vital Signs Time Taken: 09:05 Temperature (F): 98.3 Pulse (bpm): 98 Respiratory Rate (breaths/min): 20 Blood Pressure (mmHg): 177/77 Reference Range: 80 - 120 mg / dl Electronic Signature(s) Signed: 03/13/2021  5:33:54 PM By: Deon Pilling RN, BSN Entered By: Deon Pilling on 03/13/2021 09:26:45

## 2021-03-15 ENCOUNTER — Encounter (HOSPITAL_BASED_OUTPATIENT_CLINIC_OR_DEPARTMENT_OTHER): Payer: 59 | Attending: Internal Medicine | Admitting: Internal Medicine

## 2021-03-15 ENCOUNTER — Other Ambulatory Visit: Payer: Self-pay

## 2021-03-15 DIAGNOSIS — L97828 Non-pressure chronic ulcer of other part of left lower leg with other specified severity: Secondary | ICD-10-CM | POA: Diagnosis not present

## 2021-03-15 DIAGNOSIS — M1990 Unspecified osteoarthritis, unspecified site: Secondary | ICD-10-CM | POA: Diagnosis not present

## 2021-03-15 DIAGNOSIS — I87332 Chronic venous hypertension (idiopathic) with ulcer and inflammation of left lower extremity: Secondary | ICD-10-CM | POA: Insufficient documentation

## 2021-03-15 DIAGNOSIS — E669 Obesity, unspecified: Secondary | ICD-10-CM | POA: Insufficient documentation

## 2021-03-15 DIAGNOSIS — L03116 Cellulitis of left lower limb: Secondary | ICD-10-CM | POA: Diagnosis not present

## 2021-03-15 DIAGNOSIS — I872 Venous insufficiency (chronic) (peripheral): Secondary | ICD-10-CM | POA: Diagnosis not present

## 2021-03-15 DIAGNOSIS — L817 Pigmented purpuric dermatosis: Secondary | ICD-10-CM | POA: Diagnosis not present

## 2021-03-15 DIAGNOSIS — I890 Lymphedema, not elsewhere classified: Secondary | ICD-10-CM | POA: Insufficient documentation

## 2021-03-15 DIAGNOSIS — I739 Peripheral vascular disease, unspecified: Secondary | ICD-10-CM | POA: Diagnosis not present

## 2021-03-15 DIAGNOSIS — I10 Essential (primary) hypertension: Secondary | ICD-10-CM | POA: Insufficient documentation

## 2021-03-15 DIAGNOSIS — L97822 Non-pressure chronic ulcer of other part of left lower leg with fat layer exposed: Secondary | ICD-10-CM | POA: Diagnosis not present

## 2021-03-15 NOTE — Progress Notes (Signed)
MARQUITE, ATTWOOD ([id number]) Visit Report for 03/15/2021 Debridement Details Patient Name: Date of Service: Erin, [name] 03/15/2021 1:00 PM Medical Record Number: [id number] Patient Account Number: [id number] Date of Birth/Sex: Treating RN: October 25, 1955 (66 y.o. Tonita Phoenix, Lauren Primary Care Provider: [name] Other Clinician: Referring Provider: Treating Provider/Extender: Milus Banister in Treatment: 3 Debridement Performed for Assessment: Wound #3 Left,Lateral Lower Leg Performed By: Physician Ricard Dillon., MD Debridement Type: Debridement Severity of Tissue Pre Debridement: Fat layer exposed Level of Consciousness (Pre-procedure): Awake and Alert Pre-procedure Verification/Time Out Yes - 13:24 Taken: Start Time: 13:24 T Area Debrided (L x W): otal 4.5 (cm) x 6.7 (cm) = 30.15 (cm) Tissue and other material debrided: Viable, Non-Viable, Slough, Subcutaneous, Slough Level: Skin/Subcutaneous Tissue Debridement Description: Excisional Instrument: Curette Bleeding: Moderate Hemostasis Achieved: Pressure End Time: 13:27 Procedural Pain: 4 Post Procedural Pain: 2 Response to Treatment: Procedure was tolerated well Level of Consciousness (Post- Awake and Alert procedure): Post Debridement Measurements of Total Wound Length: (cm) 4.5 Width: (cm) 6.7 Depth: (cm) 0.2 Volume: (cm) 4.736 Character of Wound/Ulcer Post Debridement: Requires Further Debridement Severity of Tissue Post Debridement: Fat layer exposed Post Procedure Diagnosis Same as Pre-procedure Electronic Signature(s) Signed: 03/15/2021 4:31:34 PM By: Linton Ham MD Signed: 03/15/2021 4:37:24 PM By: Rhae Hammock RN Entered By: Linton Ham on 03/15/2021 13:47:44 -------------------------------------------------------------------------------- HPI Details Patient Name: Date of Service: Erin Face D. 03/15/2021 1:00 PM Medical Record Number: [id number] Patient  Account Number: [id number] Date of Birth/Sex: Treating RN: 29-Aug-1955 (66 y.o. 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 She tells [name] that she fell on the gravel driveway in July. She developed injuries on her distal lower leg which have not healed. She saw her primary physician on 11/15/2017 who noted her left shin injuries. Gave her antibiotics. At that point the wounds were almost circumferential however most were less than 1.5 cm. Weeping edema fluid was noted. She was referred here for evaluation. The patient has a history of chronic lower extremity edema. 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 She has had prior history with leg weeping fluid. She does not wear compression stockings. She is not doing anything specific to these wound areas. 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 She also has lymphedema. Put her in 3 layer compression and silver alginate last week. She has 3 small wounds with depth just lateral to the tibia. More problematically than this she has numerous shallow areas some of which are almost canal like in shape with tightly adherent painful debris. It would be very difficult and time- consuming to go through this and attempt to individually debride all these areas.. I changed her to collagen today to see if that  would help with any of the surface debris on some of these wounds. Otherwise we will not be able to put this in compression we have to have someone change the dressing. The patient is not eligible  for home health 12/25/17 on evaluation today patient actually appears to be doing rather well in regard to the ulcer on her lower extremity. Fortunately there does not appear to be evidence of infection at this time. She has been tolerating the dressing changes without complication. This includes the compression wrap. The only issue she had was that the wrap was initially placed over her bunion region which actually calls her some discomfort and pain. Other than that things seem to be going rather well. 01/01/2018 Seen today for follow-up and management of left lower extremity wound and lymphedema. T oday she presents with a new wound towards to the left lateral LE. Recently treated with a 7 day course of amoxicillin; reason for antibiotic dose is unknown at this time. Tolerating current treatment of collagen with 4- layer wraps. She obtained a venous reflux study on 12/25/17. Studies show on the right abnormal reflux times of the popliteal vein, great saphenous vein at the saphenofemoral junction at the proximal thigh, great saphenous vein at the mid calf, and origin of the small saphenous vein.No superficial thrombosis. No deep vein thrombosis in the common femoral, femoral,and popliteal veins. Left abnormal reflex times as well of the common femoral vein, popliteal vein, and a great saphenous vein at the saphenofemoral junction, In great saphenous vein at the mid thigh w/o thrombosis. Has any issues or concerns during visit today. Recommended follow-up to vascular specialist due to abnormalities from the venous reflux study. Denies fever, pain, chills, dizziness, nausea, or vomiting. 01/08/18 upon evaluation today the patient actually seems to be showing some signs of improvement in my opinion at this  point in regard to the lower extremity ulcerated areas. She still has a lot of drainage but fortunately nothing that appears to be too significant currently. I have been very happy with the overall progress I see today compared to where things were during the last evaluation that I had with her. Nonetheless she has not had her appointment with the vein specialist as of yet in fact we were able to get this approved for her today and confirmed with them she will be seeing them on December 26. Nonetheless in general I do feel like the compression wraps is doing well for her. 01/17/18; quite a bit of improvement since last time I saw this patient she has a small open area remaining on the left lateral calf and even smaller area medially. She has lymphedema chronic stasis changes with distal skin fibrosis. She has an appointment with vascular surgery later this month 01/24/2018; the patient's medial leg has closed. Still a small open area on the left lateral leg. She states that the 4 layer compression we put on last week was too tight and she had to take it off over a few days ago. We have had resultant increase in her lymphedema in the dorsal foot and a proximal calf. Fortunately that does not seem to have resulted in any deterioration in her wounds The patient is going to need compression stockings. We have given her measurements to phone elastic therapy in [place]. She has vascular surgery consult on December 26 02/07/18; she's had continuous contraction on the left lateral leg wound which is now very small. Currently using silver alginate under 3 layer compression She saw Dr. Trula Slade of vascular surgery on 02/06/18. It was noted that she had a normal reflux times in the popliteal vein, great saphenous vein at the saphenofemoral junction, great saphenous vein at the proximal thigh great saphenous vein  at the mid calf and origin of the small saphenous vein. It was noted that she had significant reflux  in the left saphenous veins with diameter measurements in the 0.7-0.8 cm range. It was felt she would benefit from laser ablation to help minimize the risk of ulcer recurrence. It was recommended that she wear 20-30 thigh-high compression stockings and have follow-up in 4-6 weeks 02/17/2018; I thought this lady would be healed however her compression slipped down and she developed increasing swelling and the wound is actually larger. 1/13; we had deterioration last week after the patient's compression slipped down and she developed periwound swelling. I increased her compression before layers we have been using silver alginate we are a lot better again today. She has her compression stockings in waiting 1/23; the patient's wounds are totally healed today. She has her stockings. This was almost circumferential skin damage. She follows up with Dr. Trula Slade of vascular surgery next Monday. READMISSION 02/21/2021 This is a now 66 year old woman that we had in clinic here discharging in January 2020 with wounds on her left calf chronic venous insufficiency. She was discharged with 30/40 stockings. It does not sound like she has worn stockings in about a year largely from not being able to get them on herself. In November she developed new blisters on her legs an area laterally is opened into a fairly sizable wound. She has weeping posteriorly as well. She has been using Neosporin and Band-Aids. The patient did see Dr. Trula Slade in 2019 and 2020. He felt she might benefit from laser ablation in her left saphenous veins although because of the wound that healed I do not think he went through with it. She might benefit from seeing him again. Her ABI on the left is 1. 1/17; patient's wound on the posterior left calf is closed she has the 2 large areas last week. We put her in 4-layer compression for edema control is a lot better. Our intake nurse noted greenish drainage and odor. We have been using silver  alginate 1/25; PCR culture I did have the substantial wound area on the left lateral lower leg showed staff aureus and group A strep. Low titers of coag negative staph which are probably skin contaminants. Resistance detected to tetracycline methicillin and macrolides. I gave her a starter kit of Nuzyra 150 mg x 3 for 2 days then 300 mg for a further 7 days. Marked odor considerable increase in surrounding erythema. We are using Iodoflex last week however I have changed to silver alginate with underlying Bactroban 2/1; she is completing her [place] tomorrow. The degree of erythema around the wounds looks a lot better. Odor has improved. I gave her silver alginate and Bactroban last week and changing her back to Iodoflex to continue with ongoing debridement Electronic Signature(s) Signed: 03/15/2021 4:31:34 PM By: Linton Ham MD Signed: 03/15/2021 4:31:34 PM By: Linton Ham MD Entered By: Linton Ham on 03/15/2021 13:48:31 -------------------------------------------------------------------------------- Physical Exam Details Patient Name: Date of Service: Erin Gerold [name] 03/15/2021 1:00 PM Medical Record Number: [id number] Patient Account Number: [id number] Date of Birth/Sex: Treating RN: [birth year] (66 y.o. Tonita Phoenix, Lauren Primary Care Provider: [name] Other Clinician: Referring Provider: Treating Provider/Extender: Milus Banister in Treatment: 3 Constitutional Sitting or standing Blood Pressure is within target range for patient.. Pulse regular and within target range for patient.Marland Kitchen Respirations regular, non-labored and within target range.. Temperature is normal and within the target range for the patient.Marland Kitchen Appears in no distress. Notes Wound  exam; large area on the left lateral calf. Still a nonviable surface although I think we are making progress. Aggressive debridement with a #5 curette hemostasis with direct pressure. The degree of  surrounding erythema that I was still concerned about last time looks a lot better. Electronic Signature(s) Signed: 03/15/2021 4:31:34 PM By: Linton Ham MD Entered By: Linton Ham on 03/15/2021 13:49:27 -------------------------------------------------------------------------------- Physician Orders Details Patient Name: Date of Service: Erin Gerold [name] 03/15/2021 1:00 PM Medical Record Number: [id number] Patient Account Number: [id number] Date of Birth/Sex: Treating RN: August 20, 1955 (66 y.o. [name] Primary Care Provider: [name] Other Clinician: Referring Provider: Treating Provider/Extender: Milus Banister in Treatment: 3 Verbal / Phone Orders: No Diagnosis Coding ICD-10 Coding Code Description [phone] Chronic venous hypertension (idiopathic) with ulcer and inflammation of left lower extremity L97.828 Non-pressure chronic ulcer of other part of left lower leg with other specified severity L03.116 Cellulitis of left lower limb Follow-up Appointments ppointment in 1 week. - Dr. Dellia Nims Return A Bathing/ Shower/ Hygiene May shower with protection but do not get wound dressing(s) wet. - Ok to use [employer], can purchase at [facility], [facility], or [employer] Edema Control - Lymphedema / SCD / Other Elevate legs to the level of the heart or above for 30 minutes daily and/or when sitting, a frequency of: - throughout the day Avoid standing for long periods of time. Exercise regularly Wound Treatment Wound #3 - Lower Leg Wound Laterality: Left, Lateral Cleanser: Soap and Water 1 x Per Week Discharge Instructions: May shower and wash wound with dial antibacterial soap and water prior to dressing change. Cleanser: Wound Cleanser 1 x Per Week Discharge Instructions: Cleanse the wound with wound cleanser prior to applying a clean dressing using gauze sponges, not tissue or cotton balls. Peri-Wound Care: Triamcinolone 15 (g) 1 x Per Week Discharge  Instructions: Use triamcinolone 15 (g) as directed Peri-Wound Care: Zinc Oxide Ointment 30g tube 1 x Per Week Discharge Instructions: Apply Zinc Oxide to periwound with each dressing change Peri-Wound Care: Sween Lotion (Moisturizing lotion) 1 x Per Week Discharge Instructions: Apply moisturizing lotion as directed Prim Dressing: IODOFLEX 0.9% Cadexomer Iodine Pad 4x6 cm 1 x Per Week ary Discharge Instructions: Apply to wound bed as instructed Secondary Dressing: ABD Pad, 8x10 1 x Per Week Discharge Instructions: Apply over primary dressing as directed. Secondary Dressing: Zetuvit Plus 4x8 in 1 x Per Week Discharge Instructions: Apply over primary dressing as directed. Secondary Dressing: CarboFLEX Odor Control Dressing, 4x4 in 1 x Per Week Discharge Instructions: Apply over primary dressing as directed. Compression Wrap: FourPress (4 layer compression wrap) 1 x Per Week Discharge Instructions: Apply four layer compression as directed. May also use Miliken CoFlex 2 layer compression system as alternative. Electronic Signature(s) Signed: 03/15/2021 4:31:34 PM By: Linton Ham MD Signed: 03/15/2021 5:56:18 PM By: [name] RN, BSN Entered By: [name] on 03/15/2021 13:41:43 -------------------------------------------------------------------------------- Problem List Details Patient Name: Date of Service: Erin Gerold [name] 03/15/2021 1:00 PM Medical Record Number: [id number] Patient Account Number: [id number] Date of Birth/Sex: Treating RN: 25-Feb-1955 (66 y.o. [name] Primary Care Provider: [name] Other Clinician: Referring Provider: Treating Provider/Extender: Milus Banister in Treatment: 3 Active Problems ICD-10 Encounter Code Description Active Date MDM Diagnosis 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 (idiopathic) with ulcer and inflammation of left 02/21/2021 No Yes lower extremity L97.828 Non-pressure chronic ulcer of other part  of left lower leg with other specified 02/21/2021 No Yes severity L03.116 Cellulitis of left  lower limb 03/08/2021 No Yes Inactive Problems Resolved Problems Electronic Signature(s) Signed: 03/15/2021 4:31:34 PM By: Linton Ham MD Entered By: Linton Ham on 03/15/2021 13:47:30 -------------------------------------------------------------------------------- Progress Note Details Patient Name: Date of Service: Erin Gerold [name] 03/15/2021 1:00 PM Medical Record Number: [id number] Patient Account Number: [id number] Date of Birth/Sex: Treating RN: [birth year] (66 y.o. 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 She tells [name] that she fell on the gravel driveway in July. She developed injuries on her distal lower leg which have not healed. She saw her primary physician on 11/15/2017 who noted her left shin injuries. Gave her antibiotics. At that point the wounds were almost circumferential however most were less than 1.5 cm. Weeping edema fluid was noted. She was referred here for evaluation. The patient has a history of chronic lower extremity edema. 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 She has had prior history with leg weeping fluid. She does not wear compression stockings. She is not doing anything specific to these wound areas. 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 She also has lymphedema. Put her in 3 layer compression and silver alginate last week. She has 3 small wounds with depth just lateral to the tibia. More problematically than this she has numerous shallow areas some of which are almost canal like in shape with tightly adherent painful debris. It would be very difficult and time- consuming to go through this and attempt to individually debride all these areas.. I changed her to collagen today to see if that would help with any of the surface debris on some of these wounds. Otherwise we will not be able to put this in compression we have to have someone change the dressing. The patient is not eligible for home health 12/25/17 on evaluation today patient actually appears to be doing rather well in regard to the ulcer on her lower extremity. Fortunately there does not appear to be evidence of infection at this time. She has been tolerating the dressing changes without complication. This includes the compression wrap. The only issue she had was that the wrap was initially placed over her bunion region which actually calls her some discomfort and pain. Other than that things seem to be going rather well. 01/01/2018 Seen today for follow-up and management of left lower extremity wound and lymphedema. T oday she presents with a new wound towards to the left lateral LE. Recently treated with a 7 day course of amoxicillin; reason for antibiotic dose is unknown at this time. Tolerating current treatment of collagen with 4- layer wraps. She obtained a venous reflux study on 12/25/17. Studies show on the right abnormal reflux times of the popliteal vein, great saphenous vein at the saphenofemoral junction at the proximal thigh, great saphenous vein at the mid calf, and origin of the small saphenous vein.No superficial thrombosis. No deep vein thrombosis in the common femoral, femoral,and popliteal veins. Left  abnormal reflex times as well of  the common femoral vein, popliteal vein, and a great saphenous vein at the saphenofemoral junction, In great saphenous vein at the mid thigh w/o thrombosis. Has any issues or concerns during visit today. Recommended follow-up to vascular specialist due to abnormalities from the venous reflux study. Denies fever, pain, chills, dizziness, nausea, or vomiting. 01/08/18 upon evaluation today the patient actually seems to be showing some signs of improvement in my opinion at this point in regard to the lower extremity ulcerated areas. She still has a lot of drainage but fortunately nothing that appears to be too significant currently. I have been very happy with the overall progress I see today compared to where things were during the last evaluation that I had with her. Nonetheless she has not had her appointment with the vein specialist as of yet in fact we were able to get this approved for her today and confirmed with them she will be seeing them on December 26. Nonetheless in general I do feel like the compression wraps is doing well for her. 01/17/18; quite a bit of improvement since last time I saw this patient she has a small open area remaining on the left lateral calf and even smaller area medially. She has lymphedema chronic stasis changes with distal skin fibrosis. She has an appointment with vascular surgery later this month 01/24/2018; the patient's medial leg has closed. Still a small open area on the left lateral leg. She states that the 4 layer compression we put on last week was too tight and she had to take it off over a few days ago. We have had resultant increase in her lymphedema in the dorsal foot and a proximal calf. Fortunately that does not seem to have resulted in any deterioration in her wounds The patient is going to need compression stockings. We have given her measurements to phone elastic therapy in [place]. She has vascular surgery  consult on December 26 02/07/18; she's had continuous contraction on the left lateral leg wound which is now very small. Currently using silver alginate under 3 layer compression She saw Dr. Trula Slade of vascular surgery on 02/06/18. It was noted that she had a normal reflux times in the popliteal vein, great saphenous vein at the saphenofemoral junction, great saphenous vein at the proximal thigh great saphenous vein at the mid calf and origin of the small saphenous vein. It was noted that she had significant reflux in the left saphenous veins with diameter measurements in the 0.7-0.8 cm range. It was felt she would benefit from laser ablation to help minimize the risk of ulcer recurrence. It was recommended that she wear 20-30 thigh-high compression stockings and have follow-up in 4-6 weeks 02/17/2018; I thought this lady would be healed however her compression slipped down and she developed increasing swelling and the wound is actually larger. 1/13; we had deterioration last week after the patient's compression slipped down and she developed periwound swelling. I increased her compression before layers we have been using silver alginate we are a lot better again today. She has her compression stockings in waiting 1/23; the patient's wounds are totally healed today. She has her stockings. This was almost circumferential skin damage. She follows up with Dr. Trula Slade of vascular surgery next Monday. READMISSION 02/21/2021 This is a now 66 year old woman that we had in clinic here discharging in January 2020 with wounds on her left calf chronic venous insufficiency. She was discharged with 30/40 stockings. It does not sound like she has worn stockings in about  a year largely from not being able to get them on herself. In November she developed new blisters on her legs an area laterally is opened into a fairly sizable wound. She has weeping posteriorly as well. She has been using Neosporin and  Band-Aids. The patient did see Dr. Trula Slade in 2019 and 2020. He felt she might benefit from laser ablation in her left saphenous veins although because of the wound that healed I do not think he went through with it. She might benefit from seeing him again. Her ABI on the left is 1. 1/17; patient's wound on the posterior left calf is closed she has the 2 large areas last week. We put her in 4-layer compression for edema control is a lot better. Our intake nurse noted greenish drainage and odor. We have been using silver alginate 1/25; PCR culture I did have the substantial wound area on the left lateral lower leg showed staff aureus and group A strep. Low titers of coag negative staph which are probably skin contaminants. Resistance detected to tetracycline methicillin and macrolides. I gave her a starter kit of Nuzyra 150 mg x 3 for 2 days then 300 mg for a further 7 days. Marked odor considerable increase in surrounding erythema. We are using Iodoflex last week however I have changed to silver alginate with underlying Bactroban 2/1; she is completing her [place] tomorrow. The degree of erythema around the wounds looks a lot better. Odor has improved. I gave her silver alginate and Bactroban last week and changing her back to Iodoflex to continue with ongoing debridement Objective Constitutional Sitting or standing Blood Pressure is within target range for patient.. Pulse regular and within target range for patient.Marland Kitchen Respirations regular, non-labored and within target range.. Temperature is normal and within the target range for the patient.Marland Kitchen Appears in no distress. Vitals Time Taken: 1:15 PM, Temperature: 97.7 F, Pulse: 79 bpm, Respiratory Rate: 18 breaths/min, Blood Pressure: 124/82 mmHg. General Notes: Wound exam; large area on the left lateral calf. Still a nonviable surface although I think we are making progress. Aggressive debridement with a #5 curette hemostasis with direct pressure. The  degree of surrounding erythema that I was still concerned about last time looks a lot better. Integumentary (Hair, Skin) Wound #3 status is Open. Original cause of wound was Blister. The date acquired was: 12/13/2020. The wound has been in treatment 3 weeks. The wound is located on the Left,Lateral Lower Leg. The wound measures 4.5cm length x 6.7cm width x 0.2cm depth; 23.68cm^2 area and 4.736cm^3 volume. There is Fat Layer (Subcutaneous Tissue) exposed. There is no tunneling or undermining noted. There is a large amount of serosanguineous drainage noted. The wound margin is flat and intact. There is medium (34-66%) pink granulation within the wound bed. There is a medium (34-66%) amount of necrotic tissue within the wound bed including Adherent Slough. Assessment Active Problems ICD-10 Chronic venous hypertension (idiopathic) with ulcer and inflammation of left lower extremity Non-pressure chronic ulcer of other part of left lower leg with other specified severity Cellulitis of left lower limb Procedures Wound #3 Pre-procedure diagnosis of Wound #3 is a Venous Leg Ulcer located on the Left,Lateral Lower Leg .Severity of Tissue Pre Debridement is: Fat layer exposed. There was a Excisional Skin/Subcutaneous Tissue Debridement with a total area of 30.15 sq cm performed by Ricard Dillon., MD. With the following instrument(s): Curette to remove Viable and Non-Viable tissue/material. Material removed includes Subcutaneous Tissue and Slough and. No specimens were taken. A time  out was conducted at 13:24, prior to the start of the procedure. A Moderate amount of bleeding was controlled with Pressure. The procedure was tolerated well with a pain level of 4 throughout and a pain level of 2 following the procedure. Post Debridement Measurements: 4.5cm length x 6.7cm width x 0.2cm depth; 4.736cm^3 volume. Character of Wound/Ulcer Post Debridement requires further debridement. Severity of Tissue Post  Debridement is: Fat layer exposed. Post procedure Diagnosis Wound #3: Same as Pre-Procedure Pre-procedure diagnosis of Wound #3 is a Venous Leg Ulcer located on the Left,Lateral Lower Leg . There was a Four Layer Compression Therapy Procedure by [name], RN. Post procedure Diagnosis Wound #3: Same as Pre-Procedure Plan Follow-up Appointments: Return Appointment in 1 week. - Dr. Dellia Nims Bathing/ Shower/ Hygiene: May shower with protection but do not get wound dressing(s) wet. - Ok to use [employer], can purchase at [facility], [facility], or [employer] Edema Control - Lymphedema / SCD / Other: Elevate legs to the level of the heart or above for 30 minutes daily and/or when sitting, a frequency of: - throughout the day Avoid standing for long periods of time. Exercise regularly WOUND #3: - Lower Leg Wound Laterality: Left, Lateral Cleanser: Soap and Water 1 x Per Week/ Discharge Instructions: May shower and wash wound with dial antibacterial soap and water prior to dressing change. Cleanser: Wound Cleanser 1 x Per Week/ Discharge Instructions: Cleanse the wound with wound cleanser prior to applying a clean dressing using gauze sponges, not tissue or cotton balls. Peri-Wound Care: Triamcinolone 15 (g) 1 x Per Week/ Discharge Instructions: Use triamcinolone 15 (g) as directed Peri-Wound Care: Zinc Oxide Ointment 30g tube 1 x Per Week/ Discharge Instructions: Apply Zinc Oxide to periwound with each dressing change Peri-Wound Care: Sween Lotion (Moisturizing lotion) 1 x Per Week/ Discharge Instructions: Apply moisturizing lotion as directed Prim Dressing: IODOFLEX 0.9% Cadexomer Iodine Pad 4x6 cm 1 x Per Week/ ary Discharge Instructions: Apply to wound bed as instructed Secondary Dressing: ABD Pad, 8x10 1 x Per Week/ Discharge Instructions: Apply over primary dressing as directed. Secondary Dressing: Zetuvit Plus 4x8 in 1 x Per Week/ Discharge Instructions: Apply over primary dressing as  directed. Secondary Dressing: CarboFLEX Odor Control Dressing, 4x4 in 1 x Per Week/ Discharge Instructions: Apply over primary dressing as directed. Com pression Wrap: FourPress (4 layer compression wrap) 1 x Per Week/ Discharge Instructions: Apply four layer compression as directed. May also use Miliken CoFlex 2 layer compression system as alternative. 1. I changed her back to Iodoflex today to help with ongoing debridement. This is also bacteriostatic. Still with sit to vet ABDs 3 layer compression 2. She will complete her [place] tomorrow no further antibiotics at this point 3. What I can get the surface of this wound to a nonnecrotic surface I think this woman will need advanced treatment product probably epi fix although she is well away from this currently Electronic Signature(s) Signed: 03/15/2021 4:31:34 PM By: Linton Ham MD Entered By: Linton Ham on 03/15/2021 13:51:42 -------------------------------------------------------------------------------- SuperBill Details Patient Name: Date of Service: Erin Face D. 03/15/2021 Medical Record Number: [id number] Patient Account Number: [id number] Date of Birth/Sex: Treating RN: [birth year] (66 y.o. Tonita Phoenix, Lauren Primary Care Provider: [name] Other Clinician: Referring Provider: Treating Provider/Extender: Milus Banister in Treatment: 3 Diagnosis Coding ICD-10 Codes Code Description [phone] Chronic venous hypertension (idiopathic) with ulcer and inflammation of left lower extremity L97.828 Non-pressure chronic ulcer of other part of left lower leg with other specified severity  L03.116 Cellulitis of left lower limb Facility Procedures CPT4 Code: [id number] 11 I Description: 62824 - DEB SUBQ TISSUE 20 SQ CM/< ICD-10 Diagnosis Description L97.828 Non-pressure chronic ulcer of other part of left lower leg with other specified 045 - DEB SUBQ TISS EA ADDL 20CM CD-10 Diagnosis Description  L97.828 Non-pressure chronic  ulcer of other part of left lower leg with other specified seve Modifier: severity 1 rity Quantity: 1 Physician Procedures : CPT4 Code Description Modifier [id number] 11042 - WC PHYS SUBQ TISS 20 SQ CM ICD-10 Diagnosis Description L97.828 Non-pressure chronic ulcer of other part of left lower leg with other specified severity Quantity: 1 : [id number] 68599 - WC PHYS SUBQ TISS EA ADDL 20 CM ICD-10 Diagnosis Description L97.828 Non-pressure chronic ulcer of other part of left lower leg with other specified severity Quantity: 1 Electronic Signature(s) Signed: 03/15/2021 4:31:34 PM By: Linton Ham MD Entered By: Linton Ham on 03/15/2021 13:51:54

## 2021-03-21 NOTE — Progress Notes (Signed)
[name] ([id number]) Visit Report for 03/15/2021 Arrival Information Details Patient Name: Date of Service: [name] 03/15/2021 1:00 PM Medical Record Number: [id number] Patient Account Number: [id number] Date of Birth/Sex: Treating RN: May 12, 1955 (66 y.o. [name] Primary Care Laticia Vannostrand: [name] Other Clinician: Referring Jameire Kouba: Treating Halsey Hammen/Extender: Milus Banister in Treatment: 3 Visit Information History Since Last Visit Added or deleted any medications: No Patient Arrived: [name] Any new allergies or adverse reactions: No Arrival Time: 12:57 Had a fall or experienced change in No Accompanied By: self activities of daily living that may affect Transfer Assistance: None risk of falls: Patient Identification Verified: Yes Signs or symptoms of abuse/neglect since last visito No Secondary Verification Process Completed: Yes Hospitalized since last visit: No Patient Requires Transmission-Based Precautions: No Has Dressing in Place as Prescribed: Yes Patient Has Alerts: No Has Compression in Place as Prescribed: Yes Pain Present Now: No Electronic Signature(s) Signed: 03/21/2021 8:14:39 AM By: Sharyn Creamer RN, BSN Entered By: Sharyn Creamer on 03/15/2021 12:58:58 -------------------------------------------------------------------------------- Compression Therapy Details Patient Name: Date of Service: Erin Face D. 03/15/2021 1:00 PM Medical Record Number: [id number] Patient Account Number: [id number] Date of Birth/Sex: Treating RN: [birth year] (66 y.o. [name] Primary Care Nilo Fallin: [name] Other Clinician: Referring Levon Penning: Treating Beth Goodlin/Extender: Milus Banister in Treatment: 3 Compression Therapy Performed for Wound Assessment: Wound #3 Left,Lateral Lower Leg Performed By: Clinician [name], RN Compression Type: Four Layer Post Procedure Diagnosis Same as  Pre-procedure Electronic Signature(s) Signed: 03/15/2021 5:56:18 PM By: [name] RN, BSN Entered By: [name] on 03/15/2021 13:42:18 -------------------------------------------------------------------------------- Encounter Discharge Information Details Patient Name: Date of Service: Erin Gerold [name] 03/15/2021 1:00 PM Medical Record Number: [id number] Patient Account Number: [id number] Date of Birth/Sex: Treating RN: 04/15/1955 (66 y.o. [name] Primary Care Romaine Neville: [name] Other Clinician: Referring Jheri Mitter: Treating Danaiya Steadman/Extender: Milus Banister in Treatment: 3 Encounter Discharge Information Items Post Procedure Vitals Discharge Condition: Stable Temperature (F): 97.7 Ambulatory Status: Cane Pulse (bpm): 79 Discharge Destination: Home Respiratory Rate (breaths/min): 18 Transportation: Private Auto Blood Pressure (mmHg): 124/82 Accompanied By: alone Schedule Follow-up Appointment: Yes Clinical Summary of Care: Patient Declined Electronic Signature(s) Signed: 03/15/2021 5:56:18 PM By: [name] RN, BSN Entered By: [name] on 03/15/2021 15:57:06 -------------------------------------------------------------------------------- Lower Extremity Assessment Details Patient Name: Date of Service: Erin Gerold [name] 03/15/2021 1:00 PM Medical Record Number: [id number] Patient Account Number: [id number] Date of Birth/Sex: Treating RN: 1955-11-07 (66 y.o. [name] Primary Care Aruna Nestler: [name] Other Clinician: Referring Derry Kassel: Treating Lillyn Wieczorek/Extender: Milus Banister in Treatment: 3 Edema Assessment Assessed: [Left: No] [Right: No] Edema: [Left: Ye] [Right: s] Calf Left: Right: Point of Measurement: 29 cm From Medial Instep 41.5 cm Ankle Left: Right: Point of Measurement: From Medial Instep 22 cm Vascular Assessment Pulses: Dorsalis Pedis Palpable:  [Left:Yes] Electronic Signature(s) Signed: 03/21/2021 8:14:39 AM By: Sharyn Creamer RN, BSN Entered By: Sharyn Creamer on 03/15/2021 13:09:54 -------------------------------------------------------------------------------- Multi Wound Chart Details Patient Name: Date of Service: Erin Face D. 03/15/2021 1:00 PM Medical Record Number: [id number] Patient Account Number: [id number] Date of Birth/Sex: Treating RN: 03/14/1955 (66 y.o. [name], Lauren Primary Care Samier Jaco: Other Clinician: [name] Referring Beena Catano: Treating Pesach Frisch/Extender: Milus Banister in Treatment: 3 Vital Signs Height(in): Pulse(bpm): 59 Weight(lbs): Blood Pressure(mmHg): 124/82 Body Mass Index(BMI): Temperature(F): 97.7 Respiratory Rate(breaths/min): 18 Photos: [N/A:N/A] Left, Lateral Lower Leg N/A N/A Wound Location: Blister N/A N/A Wounding  Event: Venous Leg Ulcer N/A N/A Primary Etiology: Cataracts, Lymphedema, N/A N/A Comorbid History: Hypertension, Peripheral Venous Disease, Osteoarthritis 12/13/2020 N/A N/A Date Acquired: 3 N/A N/A Weeks of Treatment: Open N/A N/A Wound Status: No N/A N/A Wound Recurrence: 4.5x6.7x0.2 N/A N/A Measurements L x W x D (cm) 23.68 N/A N/A A (cm) : rea 4.736 N/A N/A Volume (cm) : 18.50% N/A N/A % Reduction in A rea: 18.50% N/A N/A % Reduction in Volume: Full Thickness Without Exposed N/A N/A Classification: Support Structures Large N/A N/A Exudate A mount: Serosanguineous N/A N/A Exudate Type: red, brown N/A N/A Exudate Color: Flat and Intact N/A N/A Wound Margin: Medium (34-66%) N/A N/A Granulation A mount: Pink N/A N/A Granulation Quality: Medium (34-66%) N/A N/A Necrotic A mount: Fat Layer (Subcutaneous Tissue): Yes N/A N/A Exposed Structures: Fascia: No Tendon: No Muscle: No Joint: No Bone: No Small (1-33%) N/A N/A Epithelialization: Debridement - Excisional N/A  N/A Debridement: Pre-procedure Verification/Time Out 13:24 N/A N/A Taken: Subcutaneous, Slough N/A N/A Tissue Debrided: Skin/Subcutaneous Tissue N/A N/A Level: 30.15 N/A N/A Debridement A (sq cm): rea Curette N/A N/A Instrument: Moderate N/A N/A Bleeding: Pressure N/A N/A Hemostasis A chieved: 4 N/A N/A Procedural Pain: 2 N/A N/A Post Procedural Pain: Procedure was tolerated well N/A N/A Debridement Treatment Response: 4.5x6.7x0.2 N/A N/A Post Debridement Measurements L x W x D (cm) 4.736 N/A N/A Post Debridement Volume: (cm) Compression Therapy N/A N/A Procedures Performed: Debridement Treatment Notes Electronic Signature(s) Signed: 03/15/2021 4:31:34 PM By: Linton Ham MD Signed: 03/15/2021 4:37:24 PM By: Rhae Hammock RN Entered By: Linton Ham on 03/15/2021 13:47:37 -------------------------------------------------------------------------------- Multi-Disciplinary Care Plan Details Patient Name: Date of Service: Erin Face D. 03/15/2021 1:00 PM Medical Record Number: [id number] Patient Account Number: [id number] Date of Birth/Sex: Treating RN: [birth year] (66 y.o. [name] Primary Care Sonda Coppens: [name] Other Clinician: Referring Cherylanne Ardelean: Treating Ryan Palermo/Extender: Milus Banister in Treatment: 3 Multidisciplinary Care Plan reviewed with physician Active Inactive Abuse / Safety / Falls / Self Care Management Nursing Diagnoses: Potential for falls Potential for injury related to falls Goals: Patient will not experience any injury related to falls Date Initiated: 02/21/2021 Target Resolution Date: 03/24/2021 Goal Status: Active Patient/caregiver will verbalize/demonstrate measures taken to prevent injury and/or falls Date Initiated: 02/21/2021 Target Resolution Date: 03/24/2021 Goal Status: Active Interventions: Assess Activities of Daily Living upon admission and as needed Assess fall risk on admission  and as needed Assess: immobility, friction, shearing, incontinence upon admission and as needed Assess impairment of mobility on admission and as needed per policy Assess personal safety and home safety (as indicated) on admission and as needed Assess self care needs on admission and as needed Provide education on fall prevention Provide education on personal and home safety Notes: Venous Leg Ulcer Nursing Diagnoses: Actual venous Insuffiency (use after diagnosis is confirmed) Goals: Patient will maintain optimal edema control Date Initiated: 02/21/2021 Target Resolution Date: 03/24/2021 Goal Status: Active Patient/caregiver will verbalize understanding of disease process and disease management Date Initiated: 02/21/2021 Target Resolution Date: 03/24/2021 Goal Status: Active Interventions: Assess peripheral edema status every visit. Compression as ordered Provide education on venous insufficiency Notes: Wound/Skin Impairment Nursing Diagnoses: Impaired tissue integrity Knowledge deficit related to ulceration/compromised skin integrity Goals: Patient/caregiver will verbalize understanding of skin care regimen Date Initiated: 02/21/2021 Target Resolution Date: 03/24/2021 Goal Status: Active Ulcer/skin breakdown will have a volume reduction of 30% by week 4 Date Initiated: 02/21/2021 Target Resolution Date: 03/24/2021 Goal Status: Active Interventions: Assess patient/caregiver ability to obtain  necessary supplies Assess patient/caregiver ability to perform ulcer/skin care regimen upon admission and as needed Assess ulceration(s) every visit Provide education on ulcer and skin care Notes: Electronic Signature(s) Signed: 03/15/2021 5:56:18 PM By: [name] RN, BSN Entered By: [name] on 03/15/2021 13:25:29 -------------------------------------------------------------------------------- Pain Assessment Details Patient Name: Date of Service: Erin Gerold [name] 03/15/2021  1:00 PM Medical Record Number: [id number] Patient Account Number: [id number] Date of Birth/Sex: Treating RN: 11-27-1955 (66 y.o. [name] Primary Care Deanda Ruddell: [name] Other Clinician: Referring Renai Lopata: Treating Remus Hagedorn/Extender: Milus Banister in Treatment: 3 Active Problems Location of Pain Severity and Description of Pain Patient Has Paino No Site Locations Pain Management and Medication Current Pain Management: Electronic Signature(s) Signed: 03/21/2021 8:14:39 AM By: Sharyn Creamer RN, BSN Entered By: Sharyn Creamer on 03/15/2021 12:59:34 -------------------------------------------------------------------------------- Patient/Caregiver Education Details Patient Name: Date of Service: [name], Erin [name] 2/1/2023andnbsp1:00 PM Medical Record Number: [id number] Patient Account Number: [id number] Date of Birth/Gender: Treating RN: 1955/04/15 (65 y.o. [name] Primary Care Physician: [name] Other Clinician: Referring Physician: Treating Physician/Extender: Milus Banister in Treatment: 3 Education Assessment Education Provided To: Patient Education Topics Provided Wound/Skin Impairment: Methods: Explain/Verbal Responses: State content correctly Electronic Signature(s) Signed: 03/15/2021 5:56:18 PM By: [name] RN, BSN Entered By: [name] on 03/15/2021 13:27:14 -------------------------------------------------------------------------------- Wound Assessment Details Patient Name: Date of Service: Erin Gerold [name] 03/15/2021 1:00 PM Medical Record Number: [id number] Patient Account Number: [id number] Date of Birth/Sex: Treating RN: 1955-12-22 (66 y.o. [name] Primary Care Sharan Mcenaney: [name] Other Clinician: Referring Marcellina Jonsson: Treating Chiann Goffredo/Extender: Milus Banister in Treatment: 3 Wound Status Wound Number: 3 Primary Venous Leg  Ulcer Etiology: Wound Location: Left, Lateral Lower Leg Wound Open Wounding Event: Blister Status: Date Acquired: 12/13/2020 Comorbid Cataracts, Lymphedema, Hypertension, Peripheral Venous Weeks Of Treatment: 3 History: Disease, Osteoarthritis Clustered Wound: No Photos Wound Measurements Length: (cm) 4.5 Width: (cm) 6.7 Depth: (cm) 0.2 Area: (cm) 23.68 Volume: (cm) 4.736 % Reduction in Area: 18.5% % Reduction in Volume: 18.5% Epithelialization: Small (1-33%) Tunneling: No Undermining: No Wound Description Classification: Full Thickness Without Exposed Support Structures Wound Margin: Flat and Intact Exudate Amount: Large Exudate Type: Serosanguineous Exudate Color: red, brown Foul Odor After Cleansing: No Slough/Fibrino Yes Wound Bed Granulation Amount: Medium (34-66%) Exposed Structure Granulation Quality: Pink Fascia Exposed: No Necrotic Amount: Medium (34-66%) Fat Layer (Subcutaneous Tissue) Exposed: Yes Necrotic Quality: Adherent Slough Tendon Exposed: No Muscle Exposed: No Joint Exposed: No Bone Exposed: No Treatment Notes Wound #3 (Lower Leg) Wound Laterality: Left, Lateral Cleanser Soap and Water Discharge Instruction: May shower and wash wound with dial antibacterial soap and water prior to dressing change. Wound Cleanser Discharge Instruction: Cleanse the wound with wound cleanser prior to applying a clean dressing using gauze sponges, not tissue or cotton balls. Peri-Wound Care Triamcinolone 15 (g) Discharge Instruction: Use triamcinolone 15 (g) as directed Zinc Oxide Ointment 30g tube Discharge Instruction: Apply Zinc Oxide to periwound with each dressing change Sween Lotion (Moisturizing lotion) Discharge Instruction: Apply moisturizing lotion as directed Topical Primary Dressing IODOFLEX 0.9% Cadexomer Iodine Pad 4x6 cm Discharge Instruction: Apply to wound bed as instructed Secondary Dressing ABD Pad, 8x10 Discharge Instruction: Apply over  primary dressing as directed. Zetuvit Plus 4x8 in Discharge Instruction: Apply over primary dressing as directed. CarboFLEX Odor Control Dressing, 4x4 in Discharge Instruction: Apply over primary dressing as directed. Secured With Compression Wrap FourPress (4 layer compression wrap) Discharge Instruction: Apply four layer compression as  directed. May also use Miliken CoFlex 2 layer compression system as alternative. Compression Stockings Add-Ons Electronic Signature(s) Signed: 03/21/2021 8:14:39 AM By: Sharyn Creamer RN, BSN Entered By: Sharyn Creamer on 03/15/2021 13:18:05 -------------------------------------------------------------------------------- Vitals Details Patient Name: Date of Service: Erin Gerold [name] 03/15/2021 1:00 PM Medical Record Number: [id number] Patient Account Number: [id number] Date of Birth/Sex: Treating RN: [birth year] (66 y.o. [name] Primary Care Aneli Zara: Other Clinician: [name] Referring Katilin Raynes: Treating Tywaun Hiltner/Extender: Milus Banister in Treatment: 3 Vital Signs Time Taken: 13:15 Temperature (F): 97.7 Pulse (bpm): 79 Respiratory Rate (breaths/min): 18 Blood Pressure (mmHg): 124/82 Reference Range: 80 - 120 mg / dl Electronic Signature(s) Signed: 03/21/2021 8:14:39 AM By: Sharyn Creamer RN, BSN Entered By: Sharyn Creamer on 03/15/2021 13:19:27

## 2021-03-22 ENCOUNTER — Other Ambulatory Visit: Payer: Self-pay

## 2021-03-22 ENCOUNTER — Encounter (HOSPITAL_BASED_OUTPATIENT_CLINIC_OR_DEPARTMENT_OTHER): Payer: 59 | Admitting: Internal Medicine

## 2021-03-22 DIAGNOSIS — M1990 Unspecified osteoarthritis, unspecified site: Secondary | ICD-10-CM | POA: Diagnosis not present

## 2021-03-22 DIAGNOSIS — L03116 Cellulitis of left lower limb: Secondary | ICD-10-CM | POA: Diagnosis not present

## 2021-03-22 DIAGNOSIS — L97822 Non-pressure chronic ulcer of other part of left lower leg with fat layer exposed: Secondary | ICD-10-CM | POA: Diagnosis not present

## 2021-03-22 DIAGNOSIS — I872 Venous insufficiency (chronic) (peripheral): Secondary | ICD-10-CM | POA: Diagnosis not present

## 2021-03-22 DIAGNOSIS — I890 Lymphedema, not elsewhere classified: Secondary | ICD-10-CM | POA: Diagnosis not present

## 2021-03-22 DIAGNOSIS — I739 Peripheral vascular disease, unspecified: Secondary | ICD-10-CM | POA: Diagnosis not present

## 2021-03-22 DIAGNOSIS — I10 Essential (primary) hypertension: Secondary | ICD-10-CM | POA: Diagnosis not present

## 2021-03-22 DIAGNOSIS — L817 Pigmented purpuric dermatosis: Secondary | ICD-10-CM | POA: Diagnosis not present

## 2021-03-22 DIAGNOSIS — I87332 Chronic venous hypertension (idiopathic) with ulcer and inflammation of left lower extremity: Secondary | ICD-10-CM | POA: Diagnosis not present

## 2021-03-22 DIAGNOSIS — L97828 Non-pressure chronic ulcer of other part of left lower leg with other specified severity: Secondary | ICD-10-CM | POA: Diagnosis not present

## 2021-03-22 DIAGNOSIS — E669 Obesity, unspecified: Secondary | ICD-10-CM | POA: Diagnosis not present

## 2021-03-22 NOTE — Progress Notes (Signed)
CORINE, SOLORIO ([id number]) Visit Report for 03/22/2021 Debridement Details Patient Name: Date of Service: DRUSCILLA, PETSCH 03/22/2021 1:00 PM Medical Record Number: [id number] Patient Account Number: [id number] Date of Birth/Sex: Treating RN: [birth year] (66 y.o. [name] Primary Care Provider: [name] Other Clinician: Referring Provider: Treating Provider/Extender: Milus Banister in Treatment: 4 Debridement Performed for Assessment: Wound #3 Left,Lateral Lower Leg Performed By: Physician Ricard Dillon., MD Debridement Type: Debridement Severity of Tissue Pre Debridement: Fat layer exposed Level of Consciousness (Pre-procedure): Awake and Alert Pre-procedure Verification/Time Out Yes - 13:30 Taken: Start Time: 13:43 Pain Control: Other : benzocaine 20% spray T Area Debrided (L x W): otal 4.9 (cm) x 7 (cm) = 34.3 (cm) Tissue and other material debrided: Viable, Non-Viable, Slough, Subcutaneous, Slough Level: Skin/Subcutaneous Tissue Debridement Description: Excisional Instrument: Curette Bleeding: Minimum Hemostasis Achieved: Pressure Procedural Pain: 4 Post Procedural Pain: 3 Response to Treatment: Procedure was tolerated well Level of Consciousness (Post- Awake and Alert procedure): Post Debridement Measurements of Total Wound Length: (cm) 4.9 Width: (cm) 7 Depth: (cm) 0.3 Volume: (cm) 8.082 Character of Wound/Ulcer Post Debridement: Improved Severity of Tissue Post Debridement: Fat layer exposed Post Procedure Diagnosis Same as Pre-procedure Electronic Signature(s) Signed: 03/22/2021 4:36:42 PM By: Baruch Gouty RN, BSN Signed: 03/22/2021 4:37:10 PM By: Linton Ham MD Entered By: Linton Ham on 03/22/2021 13:50:18 -------------------------------------------------------------------------------- HPI Details Patient Name: Date of Service: [name] 03/22/2021 1:00 PM Medical Record Number:  [id number] Patient Account Number: [id number] Date of Birth/Sex: Treating RN: 01/10/1956 (66 y.o. 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 She tells [name] that she fell on the gravel driveway in July. She developed injuries on her distal lower leg which have not healed. She saw her primary physician on 11/15/2017 who noted her left shin injuries. Gave her antibiotics. At that point the wounds were almost circumferential however most were less than 1.5 cm. Weeping edema fluid was noted. She was referred here for evaluation. The patient has a history of chronic lower extremity edema. 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 She has had prior history with leg weeping fluid. She does not wear compression stockings. She is not doing anything specific to these wound areas. 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 She also has lymphedema. Put her in 3 layer compression and silver alginate last week. She has 3 small wounds with depth just lateral to the tibia. More problematically than this she has numerous shallow areas some of which are almost canal like in shape with tightly adherent painful debris. It would be very difficult and time- consuming to go through this and attempt to individually debride all these areas.. I changed her to collagen  today to see if that would help with any of the surface debris on some of these wounds. Otherwise we will not be able to put this in compression we have to have someone change the dressing. The patient  is not eligible for home health 12/25/17 on evaluation today patient actually appears to be doing rather well in regard to the ulcer on her lower extremity. Fortunately there does not appear to be evidence of infection at this time. She has been tolerating the dressing changes without complication. This includes the compression wrap. The only issue she had was that the wrap was initially placed over her bunion region which actually calls her some discomfort and pain. Other than that things seem to be going rather well. 01/01/2018 Seen today for follow-up and management of left lower extremity wound and lymphedema. T oday she presents with a new wound towards to the left lateral LE. Recently treated with a 7 day course of amoxicillin; reason for antibiotic dose is unknown at this time. Tolerating current treatment of collagen with 4- layer wraps. She obtained a venous reflux study on 12/25/17. Studies show on the right abnormal reflux times of the popliteal vein, great saphenous vein at the saphenofemoral junction at the proximal thigh, great saphenous vein at the mid calf, and origin of the small saphenous vein.No superficial thrombosis. No deep vein thrombosis in the common femoral, femoral,and popliteal veins. Left abnormal reflex times as well of the common femoral vein, popliteal vein, and a great saphenous vein at the saphenofemoral junction, In great saphenous vein at the mid thigh w/o thrombosis. Has any issues or concerns during visit today. Recommended follow-up to vascular specialist due to abnormalities from the venous reflux study. Denies fever, pain, chills, dizziness, nausea, or vomiting. 01/08/18 upon evaluation today the patient actually seems to be showing some signs of improvement in  my opinion at this point in regard to the lower extremity ulcerated areas. She still has a lot of drainage but fortunately nothing that appears to be too significant currently. I have been very happy with the overall progress I see today compared to where things were during the last evaluation that I had with her. Nonetheless she has not had her appointment with the vein specialist as of yet in fact we were able to get this approved for her today and confirmed with them she will be seeing them on December 26. Nonetheless in general I do feel like the compression wraps is doing well for her. 01/17/18; quite a bit of improvement since last time I saw this patient she has a small open area remaining on the left lateral calf and even smaller area medially. She has lymphedema chronic stasis changes with distal skin fibrosis. She has an appointment with vascular surgery later this month 01/24/2018; the patient's medial leg has closed. Still a small open area on the left lateral leg. She states that the 4 layer compression we put on last week was too tight and she had to take it off over a few days ago. We have had resultant increase in her lymphedema in the dorsal foot and a proximal calf. Fortunately that does not seem to have resulted in any deterioration in her wounds The patient is going to need compression stockings. We have given her measurements to phone elastic therapy in [place]. She has vascular surgery consult on December 26 02/07/18; she's had continuous contraction on the left lateral leg wound which is now very small. Currently using silver alginate under 3 layer compression She saw Dr. Trula Slade of vascular surgery on 02/06/18. It was noted that she had a normal reflux times in the popliteal vein, great saphenous vein at the saphenofemoral junction, great saphenous vein at the proximal thigh  great saphenous vein at the mid calf and origin of the small saphenous vein. It was noted that she had  significant reflux in the left saphenous veins with diameter measurements in the 0.7-0.8 cm range. It was felt she would benefit from laser ablation to help minimize the risk of ulcer recurrence. It was recommended that she wear 20-30 thigh-high compression stockings and have follow-up in 4-6 weeks 02/17/2018; I thought this lady would be healed however her compression slipped down and she developed increasing swelling and the wound is actually larger. 1/13; we had deterioration last week after the patient's compression slipped down and she developed periwound swelling. I increased her compression before layers we have been using silver alginate we are a lot better again today. She has her compression stockings in waiting 1/23; the patient's wounds are totally healed today. She has her stockings. This was almost circumferential skin damage. She follows up with Dr. Trula Slade of vascular surgery next Monday. READMISSION 02/21/2021 This is a now 66 year old woman that we had in clinic here discharging in January 2020 with wounds on her left calf chronic venous insufficiency. She was discharged with 30/40 stockings. It does not sound like she has worn stockings in about a year largely from not being able to get them on herself. In November she developed new blisters on her legs an area laterally is opened into a fairly sizable wound. She has weeping posteriorly as well. She has been using Neosporin and Band-Aids. The patient did see Dr. Trula Slade in 2019 and 2020. He felt she might benefit from laser ablation in her left saphenous veins although because of the wound that healed I do not think he went through with it. She might benefit from seeing him again. Her ABI on the left is 1. 1/17; patient's wound on the posterior left calf is closed she has the 2 large areas last week. We put her in 4-layer compression for edema control is a lot better. Our intake nurse noted greenish drainage and odor. We have been  using silver alginate 1/25; PCR culture I did have the substantial wound area on the left lateral lower leg showed staff aureus and group A strep. Low titers of coag negative staph which are probably skin contaminants. Resistance detected to tetracycline methicillin and macrolides. I gave her a starter kit of Nuzyra 150 mg x 3 for 2 days then 300 mg for a further 7 days. Marked odor considerable increase in surrounding erythema. We are using Iodoflex last week however I have changed to silver alginate with underlying Bactroban 2/1; she is completing her [place] tomorrow. The degree of erythema around the wounds looks a lot better. Odor has improved. I gave her silver alginate and Bactroban last week and changing her back to Iodoflex to continue with ongoing debridement 2/8; the periwound looks a lot better. Surface of the wound also looks somewhat better although there is still ongoing debridement to be done we have been using Iodoflex under compression. Primary dressing and silver alginate Electronic Signature(s) Signed: 03/22/2021 4:37:10 PM By: Linton Ham MD Entered By: Linton Ham on 03/22/2021 13:51:59 -------------------------------------------------------------------------------- Physical Exam Details Patient Name: Date of Service: [name] 03/22/2021 1:00 PM Medical Record Number: [id number] Patient Account Number: [id number] Date of Birth/Sex: Treating RN: 02-12-1956 (66 y.o. [name] Primary Care Provider: [name] Other Clinician: Referring Provider: Treating Provider/Extender: Milus Banister in Treatment: 4 Constitutional Sitting or standing Blood Pressure is within target range for patient.. Pulse  regular and within target range for patient.Marland Kitchen Respirations regular, non-labored and within target range.. Temperature is normal and within the target range for the patient.Marland Kitchen Appears in no distress. Notes StillWound exam; large wound  on the left lateral calf. Under illumination better surface following mechanical debridement to remove Berry adherent necrotic debris. Hemostasis with direct pressure. No evidence of surrounding infection no additional antibiotics are required. Her edema control is reasonable Electronic Signature(s) Signed: 03/22/2021 4:37:10 PM By: Linton Ham MD Entered By: Linton Ham on 03/22/2021 13:55:11 -------------------------------------------------------------------------------- Physician Orders Details Patient Name: Date of Service: [name] 03/22/2021 1:00 PM Medical Record Number: [id number] Patient Account Number: [id number] Date of Birth/Sex: Treating RN: 1956/01/12 (66 y.o. Martyn Malay, Linda Primary Care Provider: [name] Other Clinician: Referring Provider: Treating Provider/Extender: Milus Banister in Treatment: 4 Verbal / Phone Orders: No Diagnosis Coding ICD-10 Coding Code Description [phone] Chronic venous hypertension (idiopathic) with ulcer and inflammation of left lower extremity L97.828 Non-pressure chronic ulcer of other part of left lower leg with other specified severity L03.116 Cellulitis of left lower limb Follow-up Appointments ppointment in 1 week. - Dr. Dellia Nims Return A Bathing/ Shower/ Hygiene May shower with protection but do not get wound dressing(s) wet. - Ok to use [employer], can purchase at [facility], [facility], or [employer] Edema Control - Lymphedema / SCD / Other Elevate legs to the level of the heart or above for 30 minutes daily and/or when sitting, a frequency of: - throughout the day Avoid standing for long periods of time. Exercise regularly Wound Treatment Wound #3 - Lower Leg Wound Laterality: Left, Lateral Cleanser: Soap and Water 1 x Per Week Discharge Instructions: May shower and wash wound with dial antibacterial soap and water prior to dressing change. Cleanser: Wound Cleanser 1 x Per Week Discharge  Instructions: Cleanse the wound with wound cleanser prior to applying a clean dressing using gauze sponges, not tissue or cotton balls. Peri-Wound Care: Triamcinolone 15 (g) 1 x Per Week Discharge Instructions: Use triamcinolone 15 (g) as directed Peri-Wound Care: Zinc Oxide Ointment 30g tube 1 x Per Week Discharge Instructions: Apply Zinc Oxide to periwound with each dressing change Peri-Wound Care: Sween Lotion (Moisturizing lotion) 1 x Per Week Discharge Instructions: Apply moisturizing lotion as directed Prim Dressing: IODOFLEX 0.9% Cadexomer Iodine Pad 4x6 cm 1 x Per Week ary Discharge Instructions: Apply to wound bed as instructed Secondary Dressing: ABD Pad, 8x10 1 x Per Week Discharge Instructions: Apply over primary dressing as directed. Secondary Dressing: Zetuvit Plus 4x8 in 1 x Per Week Discharge Instructions: Apply over primary dressing as directed. Secondary Dressing: CarboFLEX Odor Control Dressing, 4x4 in 1 x Per Week Discharge Instructions: Apply over primary dressing as directed. Compression Wrap: FourPress (4 layer compression wrap) 1 x Per Week Discharge Instructions: Apply four layer compression as directed. May also use Miliken CoFlex 2 layer compression system as alternative. Electronic Signature(s) Signed: 03/22/2021 4:36:42 PM By: Baruch Gouty RN, BSN Signed: 03/22/2021 4:37:10 PM By: Linton Ham MD Entered By: Baruch Gouty on 03/22/2021 13:48:15 -------------------------------------------------------------------------------- Problem List Details Patient Name: Date of Service: [name] 03/22/2021 1:00 PM Medical Record Number: [id number] Patient Account Number: [id number] Date of Birth/Sex: Treating RN: 03/02/1955 (66 y.o. [name] Primary Care Provider: [name] Other Clinician: Referring Provider: Treating Provider/Extender: Milus Banister in Treatment: 4 Active Problems ICD-10 Encounter Code  Description Active Date MDM Diagnosis 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 (idiopathic) with ulcer and inflammation of left 02/21/2021 No  Yes lower extremity L97.828 Non-pressure chronic ulcer of other part of left lower leg with other specified 02/21/2021 No Yes severity L03.116 Cellulitis of left lower limb 03/08/2021 No Yes Inactive Problems Resolved Problems Electronic Signature(s) Signed: 03/22/2021 4:37:10 PM By: Linton Ham MD Entered By: Linton Ham on 03/22/2021 13:49:52 -------------------------------------------------------------------------------- Progress Note Details Patient Name: Date of Service: [name] 03/22/2021 1:00 PM Medical Record Number: [id number] Patient Account Number: [id number] Date of Birth/Sex: Treating RN: [birth year] (66 y.o. 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 She tells [name] that she fell on the gravel driveway in July. She developed injuries on her distal lower leg which have not healed. She saw her primary physician on 11/15/2017 who noted her left shin injuries. Gave her antibiotics. At that point the wounds were almost circumferential however most were less than 1.5 cm. Weeping edema fluid was noted. She was referred here for evaluation. The patient has a history of chronic lower extremity edema. 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 She has had prior history with leg weeping fluid. She does not wear compression stockings. She is not doing anything specific to these wound areas. The patient has a history of obesity, arthritis, peripheral vascular  disease hypertension lower extremity edema and Schamberg's disease ABI in our clinic was 1.3 on the left 12/17/2017; patient readmitted to the clinic last week. 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 She also has lymphedema. Put her in 3 layer compression and silver alginate last week. She has 3 small wounds with depth just lateral to the tibia. More problematically than this she has numerous shallow areas some of which are almost canal like in shape with tightly adherent painful debris. It would be very difficult and time- consuming to go through this and attempt to individually debride all these areas.. I changed her to collagen today to see if that would help with any of the surface debris on some of these wounds. Otherwise we will not be able to put this in compression we have to have someone change the dressing. The patient is not eligible for home health 12/25/17 on evaluation today patient actually appears to be doing rather well in regard to the ulcer on her lower extremity. Fortunately there does not appear to be evidence of infection at this time. She has been tolerating the dressing changes without complication. This includes the compression wrap. The only issue she had was that the wrap was initially placed over her bunion region which actually calls her some discomfort and pain. Other than that things seem to be going rather well. 01/01/2018 Seen today for follow-up and management of left lower extremity wound and lymphedema. T oday she presents with a new wound towards to the left lateral LE. Recently treated with a 7 day course of amoxicillin; reason for antibiotic dose is unknown at this time. Tolerating current treatment of collagen with 4- layer wraps. She obtained a venous reflux study on 12/25/17. Studies show on the right abnormal reflux times of the popliteal vein, great saphenous vein at the saphenofemoral junction at the  proximal thigh, great saphenous vein at the mid calf, and origin  of the small saphenous vein.No superficial thrombosis. No deep vein thrombosis in the common femoral, femoral,and popliteal veins. Left abnormal reflex times as well of the common femoral vein, popliteal vein, and a great saphenous vein at the saphenofemoral junction, In great saphenous vein at the mid thigh w/o thrombosis. Has any issues or concerns during visit today. Recommended follow-up to vascular specialist due to abnormalities from the venous reflux study. Denies fever, pain, chills, dizziness, nausea, or vomiting. 01/08/18 upon evaluation today the patient actually seems to be showing some signs of improvement in my opinion at this point in regard to the lower extremity ulcerated areas. She still has a lot of drainage but fortunately nothing that appears to be too significant currently. I have been very happy with the overall progress I see today compared to where things were during the last evaluation that I had with her. Nonetheless she has not had her appointment with the vein specialist as of yet in fact we were able to get this approved for her today and confirmed with them she will be seeing them on December 26. Nonetheless in general I do feel like the compression wraps is doing well for her. 01/17/18; quite a bit of improvement since last time I saw this patient she has a small open area remaining on the left lateral calf and even smaller area medially. She has lymphedema chronic stasis changes with distal skin fibrosis. She has an appointment with vascular surgery later this month 01/24/2018; the patient's medial leg has closed. Still a small open area on the left lateral leg. She states that the 4 layer compression we put on last week was too tight and she had to take it off over a few days ago. We have had resultant increase in her lymphedema in the dorsal foot and a proximal calf. Fortunately that does not seem to have  resulted in any deterioration in her wounds The patient is going to need compression stockings. We have given her measurements to phone elastic therapy in [place]. She has vascular surgery consult on December 26 02/07/18; she's had continuous contraction on the left lateral leg wound which is now very small. Currently using silver alginate under 3 layer compression She saw Dr. Trula Slade of vascular surgery on 02/06/18. It was noted that she had a normal reflux times in the popliteal vein, great saphenous vein at the saphenofemoral junction, great saphenous vein at the proximal thigh great saphenous vein at the mid calf and origin of the small saphenous vein. It was noted that she had significant reflux in the left saphenous veins with diameter measurements in the 0.7-0.8 cm range. It was felt she would benefit from laser ablation to help minimize the risk of ulcer recurrence. It was recommended that she wear 20-30 thigh-high compression stockings and have follow-up in 4-6 weeks 02/17/2018; I thought this lady would be healed however her compression slipped down and she developed increasing swelling and the wound is actually larger. 1/13; we had deterioration last week after the patient's compression slipped down and she developed periwound swelling. I increased her compression before layers we have been using silver alginate we are a lot better again today. She has her compression stockings in waiting 1/23; the patient's wounds are totally healed today. She has her stockings. This was almost circumferential skin damage. She follows up with Dr. Trula Slade of vascular surgery next Monday. READMISSION 02/21/2021 This is a now 66 year old woman that we had in clinic here discharging in January 2020 with  wounds on her left calf chronic venous insufficiency. She was discharged with 30/40 stockings. It does not sound like she has worn stockings in about a year largely from not being able to get them on herself.  In November she developed new blisters on her legs an area laterally is opened into a fairly sizable wound. She has weeping posteriorly as well. She has been using Neosporin and Band-Aids. The patient did see Dr. Trula Slade in 2019 and 2020. He felt she might benefit from laser ablation in her left saphenous veins although because of the wound that healed I do not think he went through with it. She might benefit from seeing him again. Her ABI on the left is 1. 1/17; patient's wound on the posterior left calf is closed she has the 2 large areas last week. We put her in 4-layer compression for edema control is a lot better. Our intake nurse noted greenish drainage and odor. We have been using silver alginate 1/25; PCR culture I did have the substantial wound area on the left lateral lower leg showed staff aureus and group A strep. Low titers of coag negative staph which are probably skin contaminants. Resistance detected to tetracycline methicillin and macrolides. I gave her a starter kit of Nuzyra 150 mg x 3 for 2 days then 300 mg for a further 7 days. Marked odor considerable increase in surrounding erythema. We are using Iodoflex last week however I have changed to silver alginate with underlying Bactroban 2/1; she is completing her [place] tomorrow. The degree of erythema around the wounds looks a lot better. Odor has improved. I gave her silver alginate and Bactroban last week and changing her back to Iodoflex to continue with ongoing debridement 2/8; the periwound looks a lot better. Surface of the wound also looks somewhat better although there is still ongoing debridement to be done we have been using Iodoflex under compression. Primary dressing and silver alginate Objective Constitutional Sitting or standing Blood Pressure is within target range for patient.. Pulse regular and within target range for patient.Marland Kitchen Respirations regular, non-labored and within target range.. Temperature is normal and  within the target range for the patient.Marland Kitchen Appears in no distress. Vitals Time Taken: 1:13 PM, Height: 62 in, Source: Stated, Weight: 226 lbs, Source: Stated, BMI: 41.3, Temperature: 98.2 F, Pulse: 74 bpm, Respiratory Rate: 18 breaths/min, Blood Pressure: 132/80 mmHg. General Notes: StillWound exam; large wound on the left lateral calf. Under illumination better surface following mechanical debridement to remove Berry adherent necrotic debris. Hemostasis with direct pressure. No evidence of surrounding infection no additional antibiotics are required. Her edema control is reasonable Integumentary (Hair, Skin) Wound #3 status is Open. Original cause of wound was Blister. The date acquired was: 12/13/2020. The wound has been in treatment 4 weeks. The wound is located on the Left,Lateral Lower Leg. The wound measures 4.9cm length x 7cm width x 0.3cm depth; 26.939cm^2 area and 8.082cm^3 volume. There is Fat Layer (Subcutaneous Tissue) exposed. There is no tunneling or undermining noted. There is a large amount of serosanguineous drainage noted. The wound margin is flat and intact. There is medium (34-66%) pink granulation within the wound bed. There is a medium (34-66%) amount of necrotic tissue within the wound bed including Adherent Slough. Assessment Active Problems ICD-10 Chronic venous hypertension (idiopathic) with ulcer and inflammation of left lower extremity Non-pressure chronic ulcer of other part of left lower leg with other specified severity Cellulitis of left lower limb Procedures Wound #3 Pre-procedure diagnosis of Wound #  3 is a Venous Leg Ulcer located on the Left,Lateral Lower Leg .Severity of Tissue Pre Debridement is: Fat layer exposed. There was a Excisional Skin/Subcutaneous Tissue Debridement with a total area of 34.3 sq cm performed by Ricard Dillon., MD. With the following instrument(s): Curette to remove Viable and Non-Viable tissue/material. Material removed includes  Subcutaneous Tissue and Slough and after achieving pain control using Other (benzocaine 20% spray). No specimens were taken. A time out was conducted at 13:30, prior to the start of the procedure. A Minimum amount of bleeding was controlled with Pressure. The procedure was tolerated well with a pain level of 4 throughout and a pain level of 3 following the procedure. Post Debridement Measurements: 4.9cm length x 7cm width x 0.3cm depth; 8.082cm^3 volume. Character of Wound/Ulcer Post Debridement is improved. Severity of Tissue Post Debridement is: Fat layer exposed. Post procedure Diagnosis Wound #3: Same as Pre-Procedure Pre-procedure diagnosis of Wound #3 is a Venous Leg Ulcer located on the Left,Lateral Lower Leg . There was a Four Layer Compression Therapy Procedure by Baruch Gouty, RN. Post procedure Diagnosis Wound #3: Same as Pre-Procedure Plan Follow-up Appointments: Return Appointment in 1 week. - Dr. Dellia Nims Bathing/ Shower/ Hygiene: May shower with protection but do not get wound dressing(s) wet. - Ok to use [employer], can purchase at [facility], [facility], or [employer] Edema Control - Lymphedema / SCD / Other: Elevate legs to the level of the heart or above for 30 minutes daily and/or when sitting, a frequency of: - throughout the day Avoid standing for long periods of time. Exercise regularly WOUND #3: - Lower Leg Wound Laterality: Left, Lateral Cleanser: Soap and Water 1 x Per Week/ Discharge Instructions: May shower and wash wound with dial antibacterial soap and water prior to dressing change. Cleanser: Wound Cleanser 1 x Per Week/ Discharge Instructions: Cleanse the wound with wound cleanser prior to applying a clean dressing using gauze sponges, not tissue or cotton balls. Peri-Wound Care: Triamcinolone 15 (g) 1 x Per Week/ Discharge Instructions: Use triamcinolone 15 (g) as directed Peri-Wound Care: Zinc Oxide Ointment 30g tube 1 x Per Week/ Discharge Instructions: Apply  Zinc Oxide to periwound with each dressing change Peri-Wound Care: Sween Lotion (Moisturizing lotion) 1 x Per Week/ Discharge Instructions: Apply moisturizing lotion as directed Prim Dressing: IODOFLEX 0.9% Cadexomer Iodine Pad 4x6 cm 1 x Per Week/ ary Discharge Instructions: Apply to wound bed as instructed Secondary Dressing: ABD Pad, 8x10 1 x Per Week/ Discharge Instructions: Apply over primary dressing as directed. Secondary Dressing: Zetuvit Plus 4x8 in 1 x Per Week/ Discharge Instructions: Apply over primary dressing as directed. Secondary Dressing: CarboFLEX Odor Control Dressing, 4x4 in 1 x Per Week/ Discharge Instructions: Apply over primary dressing as directed. Com pression Wrap: FourPress (4 layer compression wrap) 1 x Per Week/ Discharge Instructions: Apply four layer compression as directed. May also use Miliken CoFlex 2 layer compression system as alternative. 1. Still using Iodoflex under 4-layer compression 2. When the wound surface becomes healthier she is going to need an advanced treatment product to get this to close. 3. Make further mechanical debridements likely to be necessary. Surface of the wound is not viable for epithelialization at this point but it is improved Electronic Signature(s) Signed: 03/22/2021 4:37:10 PM By: Linton Ham MD Entered By: Linton Ham on 03/22/2021 13:56:06 -------------------------------------------------------------------------------- SuperBill Details Patient Name: Date of Service: [name] 03/22/2021 Medical Record Number: [id number] Patient Account Number: [id number] Date of Birth/Sex: Treating RN: 09-24-1955 (66 y.o. F)  Baruch Gouty Primary Care Provider: [name] Other Clinician: Referring Provider: Treating Provider/Extender: Milus Banister in Treatment: 4 Diagnosis Coding ICD-10 Codes Code Description [phone] Chronic venous hypertension (idiopathic) with ulcer and inflammation of  left lower extremity L97.828 Non-pressure chronic ulcer of other part of left lower leg with other specified severity L03.116 Cellulitis of left lower limb Facility Procedures CPT4 Code: [id number] 11 I Description: 042 - DEB SUBQ TISSUE 20 SQ CM/< CD-10 Diagnosis Description L97.828 Non-pressure chronic ulcer of other part of left lower leg with other specified seve Modifier: 1 rity Quantity: CPT4 Code: [id number] 11 I Description: 045 - DEB SUBQ TISS EA ADDL 20CM CD-10 Diagnosis Description L97.828 Non-pressure chronic ulcer of other part of left lower leg with other specified seve Modifier: 1 rity Quantity: Physician Procedures : CPT4 Code Description Modifier [id number] 11042 - WC PHYS SUBQ TISS 20 SQ CM ICD-10 Diagnosis Description L97.828 Non-pressure chronic ulcer of other part of left lower leg with other specified severity Quantity: 1 : [id number] 34196 - WC PHYS SUBQ TISS EA ADDL 20 CM ICD-10 Diagnosis Description L97.828 Non-pressure chronic ulcer of other part of left lower leg with other specified severity Quantity: 1 Electronic Signature(s) Signed: 03/22/2021 4:37:10 PM By: Linton Ham MD Entered By: Linton Ham on 03/22/2021 13:56:21

## 2021-03-22 NOTE — Progress Notes (Signed)
ROCSI, HAZELBAKER ([id number]) Visit Report for 03/22/2021 Arrival Information Details Patient Name: Date of Service: [name], [name] 03/22/2021 1:00 PM Medical Record Number: [id number] Patient Account Number: [id number] Date of Birth/Sex: Treating RN: 10/02/1955 (66 y.o. Martyn Malay, Linda Primary Care Gibran Veselka: [name] Other Clinician: Referring Zella Dewan: Treating Jadore Veals/Extender: Milus Banister in Treatment: 4 Visit Information History Since Last Visit Added or deleted any medications: No Patient Arrived: [name] Any new allergies or adverse reactions: No Arrival Time: 13:09 Had a fall or experienced change in No Accompanied By: self activities of daily living that may affect Transfer Assistance: None risk of falls: Patient Identification Verified: Yes Signs or symptoms of abuse/neglect since last visito No Secondary Verification Process Completed: Yes Hospitalized since last visit: No Patient Requires Transmission-Based Precautions: No Implantable device outside of the clinic excluding No Patient Has Alerts: No cellular tissue based products placed in the center since last visit: Has Dressing in Place as Prescribed: Yes Has Compression in Place as Prescribed: Yes Pain Present Now: No Electronic Signature(s) Signed: 03/22/2021 4:36:42 PM By: Baruch Gouty RN, BSN Entered By: Baruch Gouty on 03/22/2021 13:13:37 -------------------------------------------------------------------------------- Compression Therapy Details Patient Name: Date of Service: [name] [name]. 03/22/2021 1:00 PM Medical Record Number: [id number] Patient Account Number: [id number] Date of Birth/Sex: Treating RN: [birth year] (66 y.o. [name] Primary Care Deion Swift: [name] Other Clinician: Referring Kaelan Amble: Treating Ollen Rao/Extender: Milus Banister in Treatment: 4 Compression Therapy Performed for Wound Assessment: Wound #3  Left,Lateral Lower Leg Performed By: Clinician Baruch Gouty, RN Compression Type: Four Layer Post Procedure Diagnosis Same as Pre-procedure Electronic Signature(s) Signed: 03/22/2021 4:36:42 PM By: Baruch Gouty RN, BSN Entered By: Baruch Gouty on 03/22/2021 13:35:47 -------------------------------------------------------------------------------- Encounter Discharge Information Details Patient Name: Date of Service: [name] [name]. 03/22/2021 1:00 PM Medical Record Number: [id number] Patient Account Number: [id number] Date of Birth/Sex: Treating RN: Jan 07, 1956 (66 y.o. [name] Primary Care Concepcion Kirkpatrick: [name] Other Clinician: Referring Annis Lagoy: Treating Emir Nack/Extender: Milus Banister in Treatment: 4 Encounter Discharge Information Items Post Procedure Vitals Discharge Condition: Stable Temperature (F): 98.2 Ambulatory Status: Cane Pulse (bpm): 74 Discharge Destination: Home Respiratory Rate (breaths/min): 18 Transportation: Private Auto Blood Pressure (mmHg): 132/80 Accompanied By: self Schedule Follow-up Appointment: Yes Clinical Summary of Care: Patient Declined Electronic Signature(s) Signed: 03/22/2021 4:36:42 PM By: Baruch Gouty RN, BSN Entered By: Baruch Gouty on 03/22/2021 14:06:01 -------------------------------------------------------------------------------- Lower Extremity Assessment Details Patient Name: Date of Service: Erin Face D. 03/22/2021 1:00 PM Medical Record Number: [id number] Patient Account Number: [id number] Date of Birth/Sex: Treating RN: [birth year] (66 y.o. [name] Primary Care Demontez Novack: [name] Other Clinician: Referring Nasir Bright: Treating Videl Nobrega/Extender: Milus Banister in Treatment: 4 Edema Assessment Assessed: [Left: No] [Right: No] Edema: [Left: Ye] [Right: s] Calf Left: Right: Point of Measurement: 29 cm From Medial Instep 39  cm Ankle Left: Right: Point of Measurement: From Medial Instep 23 cm Vascular Assessment Pulses: Dorsalis Pedis Palpable: [Left:No] Electronic Signature(s) Signed: 03/22/2021 4:36:42 PM By: Baruch Gouty RN, BSN Entered By: Baruch Gouty on 03/22/2021 13:23:08 -------------------------------------------------------------------------------- Multi Wound Chart Details Patient Name: Date of Service: [name] [name]. 03/22/2021 1:00 PM Medical Record Number: [id number] Patient Account Number: [id number] Date of Birth/Sex: Treating RN: January 02, 1956 (66 y.o. [name] Primary Care [name]: [name] Other Clinician: Referring Orval Dortch: Treating Chue Berkovich/Extender: Milus Banister in Treatment: 4 Vital Signs Height(in): 62 Pulse(bpm): 74 Weight(lbs): 226 Blood Pressure(mmHg): 132/80 Body  Mass Index(BMI): 41.3 Temperature(F): 98.2 Respiratory Rate(breaths/min): 18 Photos: [N/A:N/A] Left, Lateral Lower Leg N/A N/A Wound Location: Blister N/A N/A Wounding Event: Venous Leg Ulcer N/A N/A Primary Etiology: Cataracts, Lymphedema, N/A N/A Comorbid History: Hypertension, Peripheral Venous Disease, Osteoarthritis 12/13/2020 N/A N/A Date Acquired: 4 N/A N/A Weeks of Treatment: Open N/A N/A Wound Status: No N/A N/A Wound Recurrence: 4.9x7x0.3 N/A N/A Measurements L x W x D (cm) 26.939 N/A N/A A (cm) : rea 8.082 N/A N/A Volume (cm) : 7.30% N/A N/A % Reduction in A rea: -39.10% N/A N/A % Reduction in Volume: Full Thickness Without Exposed N/A N/A Classification: Support Structures Large N/A N/A Exudate A mount: Serosanguineous N/A N/A Exudate Type: red, brown N/A N/A Exudate Color: Flat and Intact N/A N/A Wound Margin: Medium (34-66%) N/A N/A Granulation A mount: Pink N/A N/A Granulation Quality: Medium (34-66%) N/A N/A Necrotic A mount: Fat Layer (Subcutaneous Tissue): Yes N/A N/A Exposed Structures: Fascia:  No Tendon: No Muscle: No Joint: No Bone: No Small (1-33%) N/A N/A Epithelialization: Debridement - Excisional N/A N/A Debridement: Pre-procedure Verification/Time Out 13:30 N/A N/A Taken: Other N/A N/A Pain Control: Subcutaneous, Slough N/A N/A Tissue Debrided: Skin/Subcutaneous Tissue N/A N/A Level: 34.3 N/A N/A Debridement A (sq cm): rea Curette N/A N/A Instrument: Minimum N/A N/A Bleeding: Pressure N/A N/A Hemostasis A chieved: 4 N/A N/A Procedural Pain: 3 N/A N/A Post Procedural Pain: Procedure was tolerated well N/A N/A Debridement Treatment Response: 4.9x7x0.3 N/A N/A Post Debridement Measurements L x W x D (cm) 8.082 N/A N/A Post Debridement Volume: (cm) Compression Therapy N/A N/A Procedures Performed: Debridement Treatment Notes Electronic Signature(s) Signed: 03/22/2021 4:36:42 PM By: Baruch Gouty RN, BSN Signed: 03/22/2021 4:37:10 PM By: Linton Ham MD Entered By: Linton Ham on 03/22/2021 13:49:59 -------------------------------------------------------------------------------- Multi-Disciplinary Care Plan Details Patient Name: Date of Service: Erin Face D. 03/22/2021 1:00 PM Medical Record Number: [id number] Patient Account Number: [id number] Date of Birth/Sex: Treating RN: Sep 13, 1955 (66 y.o. [name] Primary Care Jazae Gandolfi: [name] Other Clinician: Referring Sandy Blouch: Treating Nic Lampe/Extender: Milus Banister in Treatment: 4 Multidisciplinary Care Plan reviewed with physician Active Inactive Abuse / Safety / Falls / Self Care Management Nursing Diagnoses: Potential for falls Potential for injury related to falls Goals: Patient will not experience any injury related to falls Date Initiated: 02/21/2021 Target Resolution Date: 03/24/2021 Goal Status: Active Patient/caregiver will verbalize/demonstrate measures taken to prevent injury and/or falls Date Initiated: 02/21/2021 Target  Resolution Date: 03/24/2021 Goal Status: Active Interventions: Assess Activities of Daily Living upon admission and as needed Assess fall risk on admission and as needed Assess: immobility, friction, shearing, incontinence upon admission and as needed Assess impairment of mobility on admission and as needed per policy Assess personal safety and home safety (as indicated) on admission and as needed Assess self care needs on admission and as needed Provide education on fall prevention Provide education on personal and home safety Notes: Venous Leg Ulcer Nursing Diagnoses: Actual venous Insuffiency (use after diagnosis is confirmed) Goals: Patient will maintain optimal edema control Date Initiated: 02/21/2021 Target Resolution Date: 03/24/2021 Goal Status: Active Patient/caregiver will verbalize understanding of disease process and disease management Date Initiated: 02/21/2021 Target Resolution Date: 03/24/2021 Goal Status: Active Interventions: Assess peripheral edema status every visit. Compression as ordered Provide education on venous insufficiency Notes: Wound/Skin Impairment Nursing Diagnoses: Impaired tissue integrity Knowledge deficit related to ulceration/compromised skin integrity Goals: Patient/caregiver will verbalize understanding of skin care regimen Date Initiated: 02/21/2021 Target Resolution Date: 03/24/2021 Goal Status: Active  Ulcer/skin breakdown will have a volume reduction of 30% by week 4 Date Initiated: 02/21/2021 Target Resolution Date: 03/24/2021 Goal Status: Active Interventions: Assess patient/caregiver ability to obtain necessary supplies Assess patient/caregiver ability to perform ulcer/skin care regimen upon admission and as needed Assess ulceration(s) every visit Provide education on ulcer and skin care Notes: Electronic Signature(s) Signed: 03/22/2021 4:36:42 PM By: Baruch Gouty RN, BSN Entered By: Baruch Gouty on 03/22/2021  13:36:00 -------------------------------------------------------------------------------- Pain Assessment Details Patient Name: Date of Service: [name] [name]. 03/22/2021 1:00 PM Medical Record Number: [id number] Patient Account Number: [id number] Date of Birth/Sex: Treating RN: September 14, 1955 (66 y.o. [name] Primary Care Susie Pousson: [name] Other Clinician: Referring Keelon Zurn: Treating Jonte Shiller/Extender: Milus Banister in Treatment: 4 Active Problems Location of Pain Severity and Description of Pain Patient Has Paino No Site Locations Rate the pain. Current Pain Level: 0 Character of Pain Describe the Pain: Tender Pain Management and Medication Current Pain Management: Electronic Signature(s) Signed: 03/22/2021 4:36:42 PM By: Baruch Gouty RN, BSN Entered By: Baruch Gouty on 03/22/2021 13:36:42 -------------------------------------------------------------------------------- Patient/Caregiver Education Details Patient Name: Date of Service: [name], [name] [name]. 2/8/2023andnbsp1:00 PM Medical Record Number: [id number] Patient Account Number: [id number] Date of Birth/Gender: Treating RN: [birth year] (66 y.o. [name] Primary Care Physician: [name] Other Clinician: Referring Physician: Treating Physician/Extender: Milus Banister in Treatment: 4 Education Assessment Education Provided To: Patient Education Topics Provided Venous: Methods: Explain/Verbal Responses: Reinforcements needed, State content correctly Wound/Skin Impairment: Methods: Explain/Verbal Responses: Reinforcements needed, State content correctly Electronic Signature(s) Signed: 03/22/2021 4:36:42 PM By: Baruch Gouty RN, BSN Entered By: Baruch Gouty on 03/22/2021 13:36:22 -------------------------------------------------------------------------------- Wound Assessment Details Patient Name: Date of Service: [name] [name]. 03/22/2021 1:00 PM Medical Record Number: [id number] Patient Account Number: [id number] Date of Birth/Sex: Treating RN: [birth year] (66 y.o. [name] Primary Care S[place] Moss: [name] Other Clinician: Referring Kaleea Penner: Treating Meli Faley/Extender: Milus Banister in Treatment: 4 Wound Status Wound Number: 3 Primary Venous Leg Ulcer Etiology: Wound Location: Left, Lateral Lower Leg Wound Open Wounding Event: Blister Status: Date Acquired: 12/13/2020 Comorbid Cataracts, Lymphedema, Hypertension, Peripheral Venous Weeks Of Treatment: 4 History: Disease, Osteoarthritis Clustered Wound: No Photos Wound Measurements Length: (cm) 4.9 Width: (cm) 7 Depth: (cm) 0.3 Area: (cm) 26.939 Volume: (cm) 8.082 % Reduction in Area: 7.3% % Reduction in Volume: -39.1% Epithelialization: Small (1-33%) Tunneling: No Undermining: No Wound Description Classification: Full Thickness Without Exposed Support Structures Wound Margin: Flat and Intact Exudate Amount: Large Exudate Type: Serosanguineous Exudate Color: red, brown Foul Odor After Cleansing: No Slough/Fibrino Yes Wound Bed Granulation Amount: Medium (34-66%) Exposed Structure Granulation Quality: Pink Fascia Exposed: No Necrotic Amount: Medium (34-66%) Fat Layer (Subcutaneous Tissue) Exposed: Yes Necrotic Quality: Adherent Slough Tendon Exposed: No Muscle Exposed: No Joint Exposed: No Bone Exposed: No Treatment Notes Wound #3 (Lower Leg) Wound Laterality: Left, Lateral Cleanser Soap and Water Discharge Instruction: May shower and wash wound with dial antibacterial soap and water prior to dressing change. Wound Cleanser Discharge Instruction: Cleanse the wound with wound cleanser prior to applying a clean dressing using gauze sponges, not tissue or cotton balls. Peri-Wound Care Triamcinolone 15 (g) Discharge Instruction: Use triamcinolone 15 (g) as directed Zinc Oxide Ointment  30g tube Discharge Instruction: Apply Zinc Oxide to periwound with each dressing change Sween Lotion (Moisturizing lotion) Discharge Instruction: Apply moisturizing lotion as directed Topical Primary Dressing IODOFLEX 0.9% Cadexomer Iodine Pad 4x6 cm Discharge Instruction: Apply to wound bed as instructed Secondary Dressing  ABD Pad, 8x10 Discharge Instruction: Apply over primary dressing as directed. Zetuvit Plus 4x8 in Discharge Instruction: Apply over primary dressing as directed. CarboFLEX Odor Control Dressing, 4x4 in Discharge Instruction: Apply over primary dressing as directed. Secured With Compression Wrap FourPress (4 layer compression wrap) Discharge Instruction: Apply four layer compression as directed. May also use Miliken CoFlex 2 layer compression system as alternative. Compression Stockings Add-Ons Electronic Signature(s) Signed: 03/22/2021 4:36:42 PM By: Baruch Gouty RN, BSN Entered By: Baruch Gouty on 03/22/2021 13:31:18 -------------------------------------------------------------------------------- Vitals Details Patient Name: Date of Service: [name] [name]. 03/22/2021 1:00 PM Medical Record Number: [id number] Patient Account Number: [id number] Date of Birth/Sex: Treating RN: 09-24-1955 (66 y.o. [name] Primary Care Davian Hanshaw: [name] Other Clinician: Referring Lorra Freeman: Treating Alianys Chacko/Extender: Milus Banister in Treatment: 4 Vital Signs Time Taken: 13:13 Temperature (F): 98.2 Height (in): 62 Pulse (bpm): 74 Source: Stated Respiratory Rate (breaths/min): 18 Weight (lbs): 226 Blood Pressure (mmHg): 132/80 Source: Stated Reference Range: 80 - 120 mg / dl Body Mass Index (BMI): 41.3 Electronic Signature(s) Signed: 03/22/2021 4:36:42 PM By: Baruch Gouty RN, BSN Entered By: Baruch Gouty on 03/22/2021 13:16:34

## 2021-03-29 ENCOUNTER — Encounter (HOSPITAL_BASED_OUTPATIENT_CLINIC_OR_DEPARTMENT_OTHER): Payer: 59 | Admitting: Internal Medicine

## 2021-03-29 ENCOUNTER — Other Ambulatory Visit: Payer: Self-pay

## 2021-03-29 DIAGNOSIS — E669 Obesity, unspecified: Secondary | ICD-10-CM | POA: Diagnosis not present

## 2021-03-29 DIAGNOSIS — I10 Essential (primary) hypertension: Secondary | ICD-10-CM | POA: Diagnosis not present

## 2021-03-29 DIAGNOSIS — L97822 Non-pressure chronic ulcer of other part of left lower leg with fat layer exposed: Secondary | ICD-10-CM | POA: Diagnosis not present

## 2021-03-29 DIAGNOSIS — I739 Peripheral vascular disease, unspecified: Secondary | ICD-10-CM | POA: Diagnosis not present

## 2021-03-29 DIAGNOSIS — L817 Pigmented purpuric dermatosis: Secondary | ICD-10-CM | POA: Diagnosis not present

## 2021-03-29 DIAGNOSIS — I87332 Chronic venous hypertension (idiopathic) with ulcer and inflammation of left lower extremity: Secondary | ICD-10-CM | POA: Diagnosis not present

## 2021-03-29 DIAGNOSIS — L03116 Cellulitis of left lower limb: Secondary | ICD-10-CM | POA: Diagnosis not present

## 2021-03-29 DIAGNOSIS — I890 Lymphedema, not elsewhere classified: Secondary | ICD-10-CM | POA: Diagnosis not present

## 2021-03-29 DIAGNOSIS — L97828 Non-pressure chronic ulcer of other part of left lower leg with other specified severity: Secondary | ICD-10-CM | POA: Diagnosis not present

## 2021-03-29 DIAGNOSIS — I872 Venous insufficiency (chronic) (peripheral): Secondary | ICD-10-CM | POA: Diagnosis not present

## 2021-03-29 DIAGNOSIS — M1990 Unspecified osteoarthritis, unspecified site: Secondary | ICD-10-CM | POA: Diagnosis not present

## 2021-03-29 NOTE — Progress Notes (Signed)
[name], [name] ([id number]) Visit Report for 03/29/2021 Debridement Details Patient Name: Date of Service: [name], [name] 03/29/2021 2:15 PM Medical Record Number: [id number] Patient Account Number: [id number] Date of Birth/Sex: Treating RN: Jul 09, 1955 (66 y.o. [name] Primary Care Provider: [name] Other Clinician: Referring Provider: Treating Provider/Extender: Milus Banister in Treatment: 5 Debridement Performed for Assessment: Wound #3 Left,Lateral Lower Leg Performed By: Physician Ricard Dillon., MD Debridement Type: Debridement Severity of Tissue Pre Debridement: Fat layer exposed Level of Consciousness (Pre-procedure): Awake and Alert Pre-procedure Verification/Time Out Yes - 14:30 Taken: Start Time: 14:31 Pain Control: Other : benzocaine 20% spray T Area Debrided (L x W): otal 4.8 (cm) x 5 (cm) = 24 (cm) Tissue and other material debrided: Viable, Non-Viable, Slough, Subcutaneous, Slough Level: Skin/Subcutaneous Tissue Debridement Description: Excisional Instrument: Curette Bleeding: Minimum Hemostasis Achieved: Pressure Procedural Pain: 3 Post Procedural Pain: 1 Response to Treatment: Procedure was tolerated well Level of Consciousness (Post- Awake and Alert procedure): Post Debridement Measurements of Total Wound Length: (cm) 4.8 Width: (cm) 6.7 Depth: (cm) 0.3 Volume: (cm) 7.578 Character of Wound/Ulcer Post Debridement: Requires Further Debridement Severity of Tissue Post Debridement: Fat layer exposed Post Procedure Diagnosis Same as Pre-procedure Electronic Signature(s) Signed: 03/29/2021 4:36:32 PM By: Linton Ham MD Signed: 03/29/2021 5:14:15 PM By: Baruch Gouty RN, BSN Entered By: Baruch Gouty on 03/29/2021 14:38:27 -------------------------------------------------------------------------------- HPI Details Patient Name: Date of Service: Erin Face D. 03/29/2021 2:15 PM Medical Record  Number: [id number] Patient Account Number: [id number] Date of Birth/Sex: Treating RN: 1955-05-04 (66 y.o. 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 She tells [name] that she fell on the gravel driveway in July. She developed injuries on her distal lower leg which have not healed. She saw her primary physician on 11/15/2017 who noted her left shin injuries. Gave her antibiotics. At that point the wounds were almost circumferential however most were less than 1.5 cm. Weeping edema fluid was noted. She was referred here for evaluation. The patient has a history of chronic lower extremity edema. 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 She has had prior history with leg weeping fluid. She does not wear compression stockings. She is not doing anything specific to these wound areas. 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 She also has lymphedema. Put her in 3 layer compression and silver alginate last week. She has 3 small wounds with depth just lateral to the tibia. More problematically than this she has numerous shallow areas some of which are almost canal like in shape with tightly adherent painful debris. It would be very difficult and time- consuming to go through this and attempt to individually debride all these areas.. I changed her to collagen today to see  if that would help with any of the surface debris on some of these wounds. Otherwise we will not be able to put this in compression we have to have someone change the dressing. The patient  is not eligible for home health 12/25/17 on evaluation today patient actually appears to be doing rather well in regard to the ulcer on her lower extremity. Fortunately there does not appear to be evidence of infection at this time. She has been tolerating the dressing changes without complication. This includes the compression wrap. The only issue she had was that the wrap was initially placed over her bunion region which actually calls her some discomfort and pain. Other than that things seem to be going rather well. 01/01/2018 Seen today for follow-up and management of left lower extremity wound and lymphedema. T oday she presents with a new wound towards to the left lateral LE. Recently treated with a 7 day course of amoxicillin; reason for antibiotic dose is unknown at this time. Tolerating current treatment of collagen with 4- layer wraps. She obtained a venous reflux study on 12/25/17. Studies show on the right abnormal reflux times of the popliteal vein, great saphenous vein at the saphenofemoral junction at the proximal thigh, great saphenous vein at the mid calf, and origin of the small saphenous vein.No superficial thrombosis. No deep vein thrombosis in the common femoral, femoral,and popliteal veins. Left abnormal reflex times as well of the common femoral vein, popliteal vein, and a great saphenous vein at the saphenofemoral junction, In great saphenous vein at the mid thigh w/o thrombosis. Has any issues or concerns during visit today. Recommended follow-up to vascular specialist due to abnormalities from the venous reflux study. Denies fever, pain, chills, dizziness, nausea, or vomiting. 01/08/18 upon evaluation today the patient actually seems to be showing some signs of improvement in my opinion at  this point in regard to the lower extremity ulcerated areas. She still has a lot of drainage but fortunately nothing that appears to be too significant currently. I have been very happy with the overall progress I see today compared to where things were during the last evaluation that I had with her. Nonetheless she has not had her appointment with the vein specialist as of yet in fact we were able to get this approved for her today and confirmed with them she will be seeing them on December 26. Nonetheless in general I do feel like the compression wraps is doing well for her. 01/17/18; quite a bit of improvement since last time I saw this patient she has a small open area remaining on the left lateral calf and even smaller area medially. She has lymphedema chronic stasis changes with distal skin fibrosis. She has an appointment with vascular surgery later this month 01/24/2018; the patient's medial leg has closed. Still a small open area on the left lateral leg. She states that the 4 layer compression we put on last week was too tight and she had to take it off over a few days ago. We have had resultant increase in her lymphedema in the dorsal foot and a proximal calf. Fortunately that does not seem to have resulted in any deterioration in her wounds The patient is going to need compression stockings. We have given her measurements to phone elastic therapy in [place]. She has vascular surgery consult on December 26 02/07/18; she's had continuous contraction on the left lateral leg wound which is now very small. Currently using silver alginate under 3 layer compression She saw Dr. Trula Slade of vascular surgery on 02/06/18. It was noted that she had a normal reflux times in the popliteal vein, great saphenous vein at the saphenofemoral junction, great saphenous vein at the proximal thigh  great saphenous vein at the mid calf and origin of the small saphenous vein. It was noted that she had significant  reflux in the left saphenous veins with diameter measurements in the 0.7-0.8 cm range. It was felt she would benefit from laser ablation to help minimize the risk of ulcer recurrence. It was recommended that she wear 20-30 thigh-high compression stockings and have follow-up in 4-6 weeks 02/17/2018; I thought this lady would be healed however her compression slipped down and she developed increasing swelling and the wound is actually larger. 1/13; we had deterioration last week after the patient's compression slipped down and she developed periwound swelling. I increased her compression before layers we have been using silver alginate we are a lot better again today. She has her compression stockings in waiting 1/23; the patient's wounds are totally healed today. She has her stockings. This was almost circumferential skin damage. She follows up with Dr. Trula Slade of vascular surgery next Monday. READMISSION 02/21/2021 This is a now 66 year old woman that we had in clinic here discharging in January 2020 with wounds on her left calf chronic venous insufficiency. She was discharged with 30/40 stockings. It does not sound like she has worn stockings in about a year largely from not being able to get them on herself. In November she developed new blisters on her legs an area laterally is opened into a fairly sizable wound. She has weeping posteriorly as well. She has been using Neosporin and Band-Aids. The patient did see Dr. Trula Slade in 2019 and 2020. He felt she might benefit from laser ablation in her left saphenous veins although because of the wound that healed I do not think he went through with it. She might benefit from seeing him again. Her ABI on the left is 1. 1/17; patient's wound on the posterior left calf is closed she has the 2 large areas last week. We put her in 4-layer compression for edema control is a lot better. Our intake nurse noted greenish drainage and odor. We have been using silver  alginate 1/25; PCR culture I did have the substantial wound area on the left lateral lower leg showed staff aureus and group A strep. Low titers of coag negative staph which are probably skin contaminants. Resistance detected to tetracycline methicillin and macrolides. I gave her a starter kit of Nuzyra 150 mg x 3 for 2 days then 300 mg for a further 7 days. Marked odor considerable increase in surrounding erythema. We are using Iodoflex last week however I have changed to silver alginate with underlying Bactroban 2/1; she is completing her [place] tomorrow. The degree of erythema around the wounds looks a lot better. Odor has improved. I gave her silver alginate and Bactroban last week and changing her back to Iodoflex to continue with ongoing debridement 2/8; the periwound looks a lot better. Surface of the wound also looks somewhat better although there is still ongoing debridement to be done we have been using Iodoflex under compression. Primary dressing and silver alginate 2/15; left posterior calf. Some improvement in the surface of the wound but still very gritty we have been using Iodoflex under compression. Electronic Signature(s) Signed: 03/29/2021 4:36:32 PM By: Linton Ham MD Entered By: Linton Ham on 03/29/2021 14:41:18 -------------------------------------------------------------------------------- Physical Exam Details Patient Name: Date of Service: Erin Gerold [name] 03/29/2021 2:15 PM Medical Record Number: [id number] Patient Account Number: [id number] Date of Birth/Sex: Treating RN: September 29, 1955 (66 y.o. F) Primary Care Provider: [name] Other Clinician: Referring Provider: Treating Provider/Extender:  Milus Banister in Treatment: 5 Constitutional Sitting or standing Blood Pressure is within target range for patient.. Pulse regular and within target range for patient.Marland Kitchen Respirations regular, non-labored and within target range.. Temperature  is normal and within the target range for the patient.Marland Kitchen Appears in no distress. Notes Wound exam; left posterior lateral calf. Under illumination the middle part of the larger area is still very necrotic although the rest of this has a better looking granulation. Still debriding with a #5 curette hemostasis with direct pressure Electronic Signature(s) Signed: 03/29/2021 4:36:32 PM By: Linton Ham MD Entered By: Linton Ham on 03/29/2021 14:42:17 -------------------------------------------------------------------------------- Physician Orders Details Patient Name: Date of Service: Erin Gerold [name] 03/29/2021 2:15 PM Medical Record Number: [id number] Patient Account Number: [id number] Date of Birth/Sex: Treating RN: 08-Feb-1956 (67 y.o. Martyn Malay, Linda Primary Care Provider: [name] Other Clinician: Referring Provider: Treating Provider/Extender: Milus Banister in Treatment: 5 Verbal / Phone Orders: No Diagnosis Coding ICD-10 Coding Code Description [phone] Chronic venous hypertension (idiopathic) with ulcer and inflammation of left lower extremity L97.828 Non-pressure chronic ulcer of other part of left lower leg with other specified severity L03.116 Cellulitis of left lower limb Follow-up Appointments ppointment in 1 week. - Dr. Dellia Nims room 1 Return A Bathing/ Shower/ Hygiene May shower with protection but do not get wound dressing(s) wet. - Ok to use [employer], can purchase at [facility], [facility], or [employer] Edema Control - Lymphedema / SCD / Other Elevate legs to the level of the heart or above for 30 minutes daily and/or when sitting, a frequency of: - throughout the day Avoid standing for long periods of time. Exercise regularly Wound Treatment Wound #3 - Lower Leg Wound Laterality: Left, Lateral Cleanser: Soap and Water 1 x Per Week Discharge Instructions: May shower and wash wound with dial antibacterial soap and water prior to dressing  change. Cleanser: Wound Cleanser 1 x Per Week Discharge Instructions: Cleanse the wound with wound cleanser prior to applying a clean dressing using gauze sponges, not tissue or cotton balls. Peri-Wound Care: Triamcinolone 15 (g) 1 x Per Week Discharge Instructions: Use triamcinolone 15 (g) as directed Peri-Wound Care: Zinc Oxide Ointment 30g tube 1 x Per Week Discharge Instructions: Apply Zinc Oxide to periwound with each dressing change Peri-Wound Care: Sween Lotion (Moisturizing lotion) 1 x Per Week Discharge Instructions: Apply moisturizing lotion as directed Prim Dressing: IODOFLEX 0.9% Cadexomer Iodine Pad 4x6 cm 1 x Per Week ary Discharge Instructions: Apply to wound bed as instructed Secondary Dressing: ABD Pad, 8x10 1 x Per Week Discharge Instructions: Apply over primary dressing as directed. Secondary Dressing: Zetuvit Plus 4x8 in 1 x Per Week Discharge Instructions: Apply over primary dressing as directed. Secondary Dressing: CarboFLEX Odor Control Dressing, 4x4 in 1 x Per Week Discharge Instructions: Apply over primary dressing as directed. Compression Wrap: FourPress (4 layer compression wrap) 1 x Per Week Discharge Instructions: Apply four layer compression as directed. May also use Miliken CoFlex 2 layer compression system as alternative. Patient Medications llergies: No Known Drug Allergies A Notifications Medication Indication Start End prior to debridement 03/29/2021 benzocaine DOSE topical 20 % aerosol - aerosol topical Electronic Signature(s) Signed: 03/29/2021 4:36:32 PM By: Linton Ham MD Signed: 03/29/2021 5:14:15 PM By: Baruch Gouty RN, BSN Entered By: Baruch Gouty on 03/29/2021 14:41:12 -------------------------------------------------------------------------------- Problem List Details Patient Name: Date of Service: Erin Face D. 03/29/2021 2:15 PM Medical Record Number: [id number] Patient Account Number: [id number] Date of  Birth/Sex: Treating RN: 1955/03/02 (  66 y.o. [name] Primary Care Provider: [name] Other Clinician: Referring Provider: Treating Provider/Extender: Milus Banister in Treatment: 5 Active Problems ICD-10 Encounter Code Description Active Date MDM Diagnosis 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 (idiopathic) with ulcer and inflammation of left 02/21/2021 No Yes lower extremity L97.828 Non-pressure chronic ulcer of other part of left lower leg with other specified 02/21/2021 No Yes severity L03.116 Cellulitis of left lower limb 03/08/2021 No Yes Inactive Problems Resolved Problems Electronic Signature(s) Signed: 03/29/2021 4:36:32 PM By: Linton Ham MD Entered By: Linton Ham on 03/29/2021 14:40:22 -------------------------------------------------------------------------------- Progress Note Details Patient Name: Date of Service: Erin Face D. 03/29/2021 2:15 PM Medical Record Number: [id number] Patient Account Number: [id number] Date of Birth/Sex: Treating RN: 09/29/1955 (66 y.o. 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 She tells [name] that she fell on the gravel driveway in July. She developed injuries on her distal lower leg which have not healed. She saw her primary physician on 11/15/2017 who noted her left shin injuries. Gave her antibiotics. At that point the wounds were almost circumferential however most were less than 1.5 cm. Weeping edema fluid was noted. She was referred here for evaluation. The patient has a history of chronic lower extremity edema. She says she has skin discoloration in the left lower leg which she attributes to Schamberg's disease which my understanding  is a purpuric skin dermatosis. She has had prior history with leg weeping fluid. She does not wear compression stockings. She is not doing anything specific to these wound areas. 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 She also has lymphedema. Put her in 3 layer compression and silver alginate last week. She has 3 small wounds with depth just lateral to the tibia. More problematically than this she has numerous shallow areas some of which are almost canal like in shape with tightly adherent painful debris. It would be very difficult and time- consuming to go through this and attempt to individually debride all these areas.. I changed her to collagen today to see if that would help with any of the surface debris on some of these wounds. Otherwise we will not be able to put this in compression we have to have someone change the dressing. The patient is not eligible for home health 12/25/17 on evaluation today patient actually appears to be doing rather well in regard to the ulcer on her lower extremity. Fortunately there does not appear to be evidence of infection at this time. She has been tolerating the dressing changes without complication. This includes the compression wrap. The only issue she had was that the wrap was initially placed over her bunion region which actually calls her some discomfort and pain. Other than that things seem to be going rather well. 01/01/2018 Seen today for follow-up and management of left lower extremity wound and lymphedema. T oday she presents with a new wound towards to the left lateral LE. Recently treated with a 7 day course of amoxicillin; reason for antibiotic dose is unknown at this  time. Tolerating current treatment of  collagen with 4- layer wraps. She obtained a venous reflux study on 12/25/17. Studies show on the right abnormal reflux times of the popliteal vein, great saphenous vein at the saphenofemoral junction at the proximal thigh, great saphenous vein at the mid calf, and origin of the small saphenous vein.No superficial thrombosis. No deep vein thrombosis in the common femoral, femoral,and popliteal veins. Left abnormal reflex times as well of the common femoral vein, popliteal vein, and a great saphenous vein at the saphenofemoral junction, In great saphenous vein at the mid thigh w/o thrombosis. Has any issues or concerns during visit today. Recommended follow-up to vascular specialist due to abnormalities from the venous reflux study. Denies fever, pain, chills, dizziness, nausea, or vomiting. 01/08/18 upon evaluation today the patient actually seems to be showing some signs of improvement in my opinion at this point in regard to the lower extremity ulcerated areas. She still has a lot of drainage but fortunately nothing that appears to be too significant currently. I have been very happy with the overall progress I see today compared to where things were during the last evaluation that I had with her. Nonetheless she has not had her appointment with the vein specialist as of yet in fact we were able to get this approved for her today and confirmed with them she will be seeing them on December 26. Nonetheless in general I do feel like the compression wraps is doing well for her. 01/17/18; quite a bit of improvement since last time I saw this patient she has a small open area remaining on the left lateral calf and even smaller area medially. She has lymphedema chronic stasis changes with distal skin fibrosis. She has an appointment with vascular surgery later this month 01/24/2018; the patient's medial leg has closed. Still a small open area on the left lateral leg. She  states that the 4 layer compression we put on last week was too tight and she had to take it off over a few days ago. We have had resultant increase in her lymphedema in the dorsal foot and a proximal calf. Fortunately that does not seem to have resulted in any deterioration in her wounds The patient is going to need compression stockings. We have given her measurements to phone elastic therapy in [place]. She has vascular surgery consult on December 26 02/07/18; she's had continuous contraction on the left lateral leg wound which is now very small. Currently using silver alginate under 3 layer compression She saw Dr. Trula Slade of vascular surgery on 02/06/18. It was noted that she had a normal reflux times in the popliteal vein, great saphenous vein at the saphenofemoral junction, great saphenous vein at the proximal thigh great saphenous vein at the mid calf and origin of the small saphenous vein. It was noted that she had significant reflux in the left saphenous veins with diameter measurements in the 0.7-0.8 cm range. It was felt she would benefit from laser ablation to help minimize the risk of ulcer recurrence. It was recommended that she wear 20-30 thigh-high compression stockings and have follow-up in 4-6 weeks 02/17/2018; I thought this lady would be healed however her compression slipped down and she developed increasing swelling and the wound is actually larger. 1/13; we had deterioration last week after the patient's compression slipped down and she developed periwound swelling. I increased her compression before layers we have been using silver alginate we are a lot better again today. She has her compression stockings in waiting 1/23; the patient's  wounds are totally healed today. She has her stockings. This was almost circumferential skin damage. She follows up with Dr. Trula Slade of vascular surgery next Monday. READMISSION 02/21/2021 This is a now 66 year old woman that we had in clinic  here discharging in January 2020 with wounds on her left calf chronic venous insufficiency. She was discharged with 30/40 stockings. It does not sound like she has worn stockings in about a year largely from not being able to get them on herself. In November she developed new blisters on her legs an area laterally is opened into a fairly sizable wound. She has weeping posteriorly as well. She has been using Neosporin and Band-Aids. The patient did see Dr. Trula Slade in 2019 and 2020. He felt she might benefit from laser ablation in her left saphenous veins although because of the wound that healed I do not think he went through with it. She might benefit from seeing him again. Her ABI on the left is 1. 1/17; patient's wound on the posterior left calf is closed she has the 2 large areas last week. We put her in 4-layer compression for edema control is a lot better. Our intake nurse noted greenish drainage and odor. We have been using silver alginate 1/25; PCR culture I did have the substantial wound area on the left lateral lower leg showed staff aureus and group A strep. Low titers of coag negative staph which are probably skin contaminants. Resistance detected to tetracycline methicillin and macrolides. I gave her a starter kit of Nuzyra 150 mg x 3 for 2 days then 300 mg for a further 7 days. Marked odor considerable increase in surrounding erythema. We are using Iodoflex last week however I have changed to silver alginate with underlying Bactroban 2/1; she is completing her [place] tomorrow. The degree of erythema around the wounds looks a lot better. Odor has improved. I gave her silver alginate and Bactroban last week and changing her back to Iodoflex to continue with ongoing debridement 2/8; the periwound looks a lot better. Surface of the wound also looks somewhat better although there is still ongoing debridement to be done we have been using Iodoflex under compression. Primary dressing and silver  alginate 2/15; left posterior calf. Some improvement in the surface of the wound but still very gritty we have been using Iodoflex under compression. Objective Constitutional Sitting or standing Blood Pressure is within target range for patient.. Pulse regular and within target range for patient.Marland Kitchen Respirations regular, non-labored and within target range.. Temperature is normal and within the target range for the patient.Marland Kitchen Appears in no distress. Vitals Time Taken: 2:19 PM, Height: 62 in, Source: Stated, Weight: 226 lbs, Source: Stated, BMI: 41.3, Temperature: 97.8 F, Pulse: 98 bpm, Respiratory Rate: 18 breaths/min, Blood Pressure: 129/75 mmHg. General Notes: Wound exam; left posterior lateral calf. Under illumination the middle part of the larger area is still very necrotic although the rest of this has a better looking granulation. Still debriding with a #5 curette hemostasis with direct pressure Integumentary (Hair, Skin) Wound #3 status is Open. Original cause of wound was Blister. The date acquired was: 12/13/2020. The wound has been in treatment 5 weeks. The wound is located on the Left,Lateral Lower Leg. The wound measures 4.8cm length x 6.7cm width x 0.3cm depth; 25.258cm^2 area and 7.578cm^3 volume. There is Fat Layer (Subcutaneous Tissue) exposed. There is no tunneling or undermining noted. There is a large amount of serosanguineous drainage noted. The wound margin is flat and intact. There is  medium (34-66%) pink granulation within the wound bed. There is a medium (34-66%) amount of necrotic tissue within the wound bed including Adherent Slough. Assessment Active Problems ICD-10 Chronic venous hypertension (idiopathic) with ulcer and inflammation of left lower extremity Non-pressure chronic ulcer of other part of left lower leg with other specified severity Cellulitis of left lower limb Procedures Wound #3 Pre-procedure diagnosis of Wound #3 is a Venous Leg Ulcer located on the  Left,Lateral Lower Leg .Severity of Tissue Pre Debridement is: Fat layer exposed. There was a Excisional Skin/Subcutaneous Tissue Debridement with a total area of 24 sq cm performed by Ricard Dillon., MD. With the following instrument(s): Curette to remove Viable and Non-Viable tissue/material. Material removed includes Subcutaneous Tissue and Slough and after achieving pain control using Other (benzocaine 20% spray). No specimens were taken. A time out was conducted at 14:30, prior to the start of the procedure. A Minimum amount of bleeding was controlled with Pressure. The procedure was tolerated well with a pain level of 3 throughout and a pain level of 1 following the procedure. Post Debridement Measurements: 4.8cm length x 6.7cm width x 0.3cm depth; 7.578cm^3 volume. Character of Wound/Ulcer Post Debridement requires further debridement. Severity of Tissue Post Debridement is: Fat layer exposed. Post procedure Diagnosis Wound #3: Same as Pre-Procedure Pre-procedure diagnosis of Wound #3 is a Venous Leg Ulcer located on the Left,Lateral Lower Leg . There was a Four Layer Compression Therapy Procedure by Baruch Gouty, RN. Post procedure Diagnosis Wound #3: Same as Pre-Procedure Plan Follow-up Appointments: Return Appointment in 1 week. - Dr. Dellia Nims room 1 Bathing/ Shower/ Hygiene: May shower with protection but do not get wound dressing(s) wet. - Ok to use Market [employer], can purchase at [facility], [facility], or [employer] Edema Control - Lymphedema / SCD / Other: Elevate legs to the level of the heart or above for 30 minutes daily and/or when sitting, a frequency of: - throughout the day Avoid standing for long periods of time. Exercise regularly The following medication(s) was prescribed: benzocaine topical 20 % aerosol aerosol topical for prior to debridement was prescribed at facility WOUND #3: - Lower Leg Wound Laterality: Left, Lateral Cleanser: Soap and Water 1 x Per Week/ Discharge  Instructions: May shower and wash wound with dial antibacterial soap and water prior to dressing change. Cleanser: Wound Cleanser 1 x Per Week/ Discharge Instructions: Cleanse the wound with wound cleanser prior to applying a clean dressing using gauze sponges, not tissue or cotton balls. Peri-Wound Care: Triamcinolone 15 (g) 1 x Per Week/ Discharge Instructions: Use triamcinolone 15 (g) as directed Peri-Wound Care: Zinc Oxide Ointment 30g tube 1 x Per Week/ Discharge Instructions: Apply Zinc Oxide to periwound with each dressing change Peri-Wound Care: Sween Lotion (Moisturizing lotion) 1 x Per Week/ Discharge Instructions: Apply moisturizing lotion as directed Prim Dressing: IODOFLEX 0.9% Cadexomer Iodine Pad 4x6 cm 1 x Per Week/ ary Discharge Instructions: Apply to wound bed as instructed Secondary Dressing: ABD Pad, 8x10 1 x Per Week/ Discharge Instructions: Apply over primary dressing as directed. Secondary Dressing: Zetuvit Plus 4x8 in 1 x Per Week/ Discharge Instructions: Apply over primary dressing as directed. Secondary Dressing: CarboFLEX Odor Control Dressing, 4x4 in 1 x Per Week/ Discharge Instructions: Apply over primary dressing as directed. Com pression Wrap: FourPress (4 layer compression wrap) 1 x Per Week/ Discharge Instructions: Apply four layer compression as directed. May also use [place]iken CoFlex 2 layer compression system as alternative. 1Still using Iodoflex Under compression 2. [place]ap[place] through [employer])  Signed: 03/29/2021 4:36:32 PM By: Linton Ham MD Entered By: Linton Ham on 03/29/2021 14:43:43 -------------------------------------------------------------------------------- SuperBill Details Patient Name: Date of Service: Erin Face D. 03/29/2021 Medical Record Number: [id number] Patient Account Number: [id number] Date of Birth/Sex: Treating RN: 08/04/1955 (66 y.o. F) Primary Care Provider: [name] Other  Clinician: Referring Provider: Treating Provider/Extender: Milus Banister in Treatment: 5 Diagnosis Coding ICD-10 Codes Code Description [phone] Chronic venous hypertension (idiopathic) with ulcer and inflammation of left lower extremity L97.828 Non-pressure chronic ulcer of other part of left lower leg with other specified severity L03.116 Cellulitis of left lower limb Facility Procedures CPT4 Code: [id number] Description: 29798 - DEB SUBQ TISSUE 20 SQ CM/< ICD-10 Diagnosis Description L97.828 Non-pressure chronic ulcer of other part of left lower leg with other specified Modifier: severity Quantity: 1 CPT4 Code: [id number] Description: 40814 - DEB SUBQ TISS EA ADDL 20CM ICD-10 Diagnosis Description L97.828 Non-pressure chronic ulcer of other part of left lower leg with other specified Modifier: severity Quantity: 1 Physician Procedures : CPT4 Code Description Modifier [id number] 11042 - WC PHYS SUBQ TISS 20 SQ CM ICD-10 Diagnosis Description L97.828 Non-pressure chronic ulcer of other part of left lower leg with other specified severity Quantity: 1 : [id number] - WC PHYS SUBQ TISS EA ADDL 20 CM ICD-10 Diagnosis Description L97.828 Non-pressure chronic ulcer of other part of left lower leg with other specified severity Quantity: 1 Electronic Signature(s) Signed: 03/29/2021 4:36:32 PM By: Linton Ham MD Signed: 03/29/2021 5:14:15 PM By: Baruch Gouty RN, BSN Entered By: Baruch Gouty on 03/29/2021 14:55:57

## 2021-04-05 ENCOUNTER — Encounter (HOSPITAL_BASED_OUTPATIENT_CLINIC_OR_DEPARTMENT_OTHER): Payer: 59 | Admitting: General Surgery

## 2021-04-05 ENCOUNTER — Other Ambulatory Visit: Payer: Self-pay

## 2021-04-05 DIAGNOSIS — I10 Essential (primary) hypertension: Secondary | ICD-10-CM | POA: Diagnosis not present

## 2021-04-05 DIAGNOSIS — L97822 Non-pressure chronic ulcer of other part of left lower leg with fat layer exposed: Secondary | ICD-10-CM | POA: Diagnosis not present

## 2021-04-05 DIAGNOSIS — L03116 Cellulitis of left lower limb: Secondary | ICD-10-CM | POA: Diagnosis not present

## 2021-04-05 DIAGNOSIS — I872 Venous insufficiency (chronic) (peripheral): Secondary | ICD-10-CM | POA: Diagnosis not present

## 2021-04-05 DIAGNOSIS — E669 Obesity, unspecified: Secondary | ICD-10-CM | POA: Diagnosis not present

## 2021-04-05 DIAGNOSIS — I87332 Chronic venous hypertension (idiopathic) with ulcer and inflammation of left lower extremity: Secondary | ICD-10-CM | POA: Diagnosis not present

## 2021-04-05 DIAGNOSIS — M1990 Unspecified osteoarthritis, unspecified site: Secondary | ICD-10-CM | POA: Diagnosis not present

## 2021-04-05 DIAGNOSIS — I890 Lymphedema, not elsewhere classified: Secondary | ICD-10-CM | POA: Diagnosis not present

## 2021-04-05 DIAGNOSIS — I739 Peripheral vascular disease, unspecified: Secondary | ICD-10-CM | POA: Diagnosis not present

## 2021-04-05 DIAGNOSIS — L817 Pigmented purpuric dermatosis: Secondary | ICD-10-CM | POA: Diagnosis not present

## 2021-04-05 DIAGNOSIS — L97828 Non-pressure chronic ulcer of other part of left lower leg with other specified severity: Secondary | ICD-10-CM | POA: Diagnosis not present

## 2021-04-05 NOTE — Progress Notes (Signed)
[name], [name] ([id number]) Visit Report for 04/05/2021 Chief Complaint Document Details Patient Name: Date of Service: [name], [name] 04/05/2021 2:45 PM Medical Record Number: [id number] Patient Account Number: [id number] Date of Birth/Sex: Treating RN: 1955-10-29 (66 y.o. [name] Primary Care Provider: [name] Other Clinician: Referring Provider: Treating Provider/Extender: [name] in Treatment: 6 Information Obtained from: Patient Chief Complaint 04/05/2021: The patient is here for ongoing follow-up regarding 2 left lower extremity wounds. Electronic Signature(s) Signed: 04/05/2021 3:25:04 PM By: [name] MD FACS Entered By: [name] on 04/05/2021 15:25:03 -------------------------------------------------------------------------------- Debridement Details Patient Name: Date of Service: Erin Face D. 04/05/2021 2:45 PM Medical Record Number: [id number] Patient Account Number: [id number] Date of Birth/Sex: Treating RN: 1955/02/21 (66 y.o. [name] Primary Care Provider: [name] Other Clinician: Referring Provider: Treating Provider/Extender: [name] in Treatment: 6 Debridement Performed for Assessment: Wound #3 Left,Lateral Lower Leg Performed By: Physician [name], MD Debridement Type: Debridement Severity of Tissue Pre Debridement: Fat layer exposed Level of Consciousness (Pre-procedure): Awake and Alert Pre-procedure Verification/Time Out Yes - 15:05 Taken: Start Time: 15:15 Pain Control: Other : benzocaine 20% spray T Area Debrided (L x W): otal 3 (cm) x 3 (cm) = 9 (cm) Tissue and other material debrided: Viable, Non-Viable, Slough, Subcutaneous, Slough Level: Skin/Subcutaneous Tissue Debridement Description: Excisional Instrument: Curette Bleeding: Minimum Hemostasis Achieved: Pressure Procedural Pain: 2 Post Procedural Pain: 1 Response  to Treatment: Procedure was tolerated well Level of Consciousness (Post- Awake and Alert procedure): Post Debridement Measurements of Total Wound Length: (cm) 4.3 Width: (cm) 6 Depth: (cm) 0.3 Volume: (cm) 6.079 Character of Wound/Ulcer Post Debridement: Requires Further Debridement Severity of Tissue Post Debridement: Fat layer exposed Post Procedure Diagnosis Same as Pre-procedure Electronic Signature(s) Signed: 04/05/2021 3:36:17 PM By: [name] MD FACS Signed: 04/05/2021 5:17:37 PM By: Baruch Gouty RN, BSN Entered By: Baruch Gouty on 04/05/2021 15:20:29 -------------------------------------------------------------------------------- HPI Details Patient Name: Date of Service: Erin Face D. 04/05/2021 2:45 PM Medical Record Number: [id number] Patient Account Number: [id number] Date of Birth/Sex: Treating RN: [birth year] (66 y.o. 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 She tells [name] that she fell on the gravel driveway in July. She developed injuries on her distal lower leg which have not healed. She saw her primary physician on 11/15/2017 who noted her left shin injuries. Gave her antibiotics. At that point the wounds were almost circumferential however most were less than 1.5 cm. Weeping edema fluid was noted. She was referred here for evaluation. The patient has a history of chronic lower extremity edema. 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 She has had prior history with leg weeping fluid. She does not wear compression stockings. She is not doing anything specific to these  wound areas. 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 She also has lymphedema. Put her in 3 layer compression and silver alginate last week. She has 3 small  wounds with depth just lateral to the tibia. More problematically than this she has numerous shallow areas some of which are almost canal like in shape with tightly adherent painful debris. It would be very difficult and time- consuming to go through this and attempt to individually debride all these areas.. I changed her to collagen today to see if that would help with any of the surface debris on some of these wounds. Otherwise we will not be able to put this in compression we have to have someone change the dressing. The patient is not eligible for home health 12/25/17 on evaluation today patient actually appears to be doing rather well in regard to the ulcer on her lower extremity. Fortunately there does not appear to be evidence of infection at this time. She has been tolerating the dressing changes without complication. This includes the compression wrap. The only issue she had was that the wrap was initially placed over her bunion region which actually calls her some discomfort and pain. Other than that things seem to be going rather well. 01/01/2018 Seen today for follow-up and management of left lower extremity wound and lymphedema. T oday she presents with a new wound towards to the left lateral LE. Recently treated with a 7 day course of amoxicillin; reason for antibiotic dose is unknown at this time. Tolerating current treatment of collagen with 4- layer wraps. She obtained a venous reflux study on 12/25/17. Studies show on the right abnormal reflux times of  the popliteal vein, great saphenous vein at the saphenofemoral junction at the proximal thigh, great saphenous vein at the mid calf, and origin of the small saphenous vein.No superficial thrombosis. No deep vein thrombosis in the common femoral, femoral,and popliteal veins. Left abnormal reflex times as well of the common femoral vein, popliteal vein, and a great saphenous vein at the saphenofemoral junction, In great saphenous vein at the mid thigh w/o thrombosis. Has any issues or concerns during visit today. Recommended follow-up to vascular specialist due to abnormalities from the venous reflux study. Denies fever, pain, chills, dizziness, nausea, or vomiting. 01/08/18 upon evaluation today the patient actually seems to be showing some signs of improvement in my opinion at this point in regard to the lower extremity ulcerated areas. She still has a lot of drainage but fortunately nothing that appears to be too significant currently. I have been very happy with the overall progress I see today compared to where things were during the last evaluation that I had with her. Nonetheless she has not had her appointment with the vein specialist as of yet in fact we were able to get this approved for her today and confirmed with them she will be seeing them on December 26. Nonetheless in general I do feel like the compression wraps is doing well for her. 01/17/18; quite a bit of improvement since last time I saw this patient she has a small open area remaining on the left lateral calf and even smaller area medially. She has lymphedema chronic stasis changes with distal skin fibrosis. She has an appointment with vascular surgery later this month 01/24/2018; the patient's medial leg has closed. Still a small open area on the left lateral leg. She states that the 4 layer compression we put on last week was too tight and she had to take it off over a few days ago. We have had resultant increase in her lymphedema  in the dorsal foot and a proximal calf. Fortunately that does not seem to  have resulted in any deterioration in her wounds The patient is going to need compression stockings. We have given her measurements to phone elastic therapy in [place]. She has vascular surgery consult on December 26 02/07/18; she's had continuous contraction on the left lateral leg wound which is now very small. Currently using silver alginate under 3 layer compression She saw Dr. Trula Slade of vascular surgery on 02/06/18. It was noted that she had a normal reflux times in the popliteal vein, great saphenous vein at the saphenofemoral junction, great saphenous vein at the proximal thigh great saphenous vein at the mid calf and origin of the small saphenous vein. It was noted that she had significant reflux in the left saphenous veins with diameter measurements in the 0.7-0.8 cm range. It was felt she would benefit from laser ablation to help minimize the risk of ulcer recurrence. It was recommended that she wear 20-30 thigh-high compression stockings and have follow-up in 4-6 weeks 02/17/2018; I thought this lady would be healed however her compression slipped down and she developed increasing swelling and the wound is actually larger. 1/13; we had deterioration last week after the patient's compression slipped down and she developed periwound swelling. I increased her compression before layers we have been using silver alginate we are a lot better again today. She has her compression stockings in waiting 1/23; the patient's wounds are totally healed today. She has her stockings. This was almost circumferential skin damage. She follows up with Dr. Trula Slade of vascular surgery next Monday. READMISSION 02/21/2021 This is a now 66 year old woman that we had in clinic here discharging in January 2020 with wounds on her left calf chronic venous insufficiency. She was discharged with 30/40 stockings. It does not sound like she has worn  stockings in about a year largely from not being able to get them on herself. In November she developed new blisters on her legs an area laterally is opened into a fairly sizable wound. She has weeping posteriorly as well. She has been using Neosporin and Band-Aids. The patient did see Dr. Trula Slade in 2019 and 2020. He felt she might benefit from laser ablation in her left saphenous veins although because of the wound that healed I do not think he went through with it. She might benefit from seeing him again. Her ABI on the left is 1. 1/17; patient's wound on the posterior left calf is closed she has the 2 large areas last week. We put her in 4-layer compression for edema control is a lot better. Our intake nurse noted greenish drainage and odor. We have been using silver alginate 1/25; PCR culture I did have the substantial wound area on the left lateral lower leg showed staff aureus and group A strep. Low titers of coag negative staph which are probably skin contaminants. Resistance detected to tetracycline methicillin and macrolides. I gave her a starter kit of Nuzyra 150 mg x 3 for 2 days then 300 mg for a further 7 days. Marked odor considerable increase in surrounding erythema. We are using Iodoflex last week however I have changed to silver alginate with underlying Bactroban 2/1; she is completing her [place] tomorrow. The degree of erythema around the wounds looks a lot better. Odor has improved. I gave her silver alginate and Bactroban last week and changing her back to Iodoflex to continue with ongoing debridement 2/8; the periwound looks a lot better. Surface of the wound also looks somewhat better although there is still ongoing debridement to be done we  have been using Iodoflex under compression. Primary dressing and silver alginate 2/15; left posterior calf. Some improvement in the surface of the wound but still very gritty we have been using Iodoflex under compression. 04/05/2021: Left  lateral posterior calf. She continues to have significant amounts of drainage, some of which is probably comprised of the Iodoflex microbleeds. There is no odor to the drainage. The satellite lesion on the more posterior aspect of the calf is epithelializing nicely and has contracted quite a bit. There is robust granulation tissue in the dominant wound with minimal adherent slough. Electronic Signature(s) Signed: 04/05/2021 3:26:30 PM By: [name] MD FACS Entered By: [name] on 04/05/2021 15:26:30 -------------------------------------------------------------------------------- Physical Exam Details Patient Name: Date of Service: Erin Face D. 04/05/2021 2:45 PM Medical Record Number: [id number] Patient Account Number: [id number] Date of Birth/Sex: Treating RN: [birth year] (66 y.o. [name] Primary Care Provider: [name] Other Clinician: Referring Provider: Treating Provider/Extender: [name] in Treatment: 6 Constitutional . . No acute distress. Cardiovascular 2+ pitting edema in the left lower extremity; right lower extremity not examined.. Notes 04/05/2021: Wound examthe left posterolateral calf has 2 adjacent lesions. The bridge of new skin between them has increased in size, as the smaller satellite lesion continues to close and contract. The base of the smaller wound has a nice bed of granulation tissue present. The larger area shows an improvement in the character of the tissue at the base. The granulation tissue is somewhat robust, but the necrotic areas noted in the past are no longer present. There is some gritty adherent slough and a small area of the lesion, which was debrided with a #3 curette. I think it premature to chemically cauterize the granulation tissue, as we are just now getting it really cleaned up from the prior infection and have a good wound base from which to work at this time. Electronic  Signature(s) Signed: 04/05/2021 3:30:55 PM By: [name] MD FACS Entered By: [name] on 04/05/2021 15:30:55 -------------------------------------------------------------------------------- Physician Orders Details Patient Name: Date of Service: Erin Face D. 04/05/2021 2:45 PM Medical Record Number: [id number] Patient Account Number: [id number] Date of Birth/Sex: Treating RN: 1955/07/04 (66 y.o. [name] Primary Care Provider: [name] Other Clinician: Referring Provider: Treating Provider/Extender: [name] in Treatment: 6 Verbal / Phone Orders: No Diagnosis Coding ICD-10 Coding Code Description [id number] Chronic venous hypertension (idiopathic) with ulcer and inflammation of left lower extremity L97.828 Non-pressure chronic ulcer of other part of left lower leg with other specified severity Follow-up Appointments ppointment in 1 week. - Dr. Celine Ahr room 3 Return A Bathing/ Shower/ Hygiene May shower with protection but do not get wound dressing(s) wet. - Ok to use [employer], can purchase at [facility], [facility], or [employer] Edema Control - Lymphedema / SCD / Other Elevate legs to the level of the heart or above for 30 minutes daily and/or when sitting, a frequency of: - throughout the day Avoid standing for long periods of time. Exercise regularly Wound Treatment Wound #3 - Lower Leg Wound Laterality: Left, Lateral Cleanser: Soap and Water 1 x Per Week Discharge Instructions: May shower and wash wound with dial antibacterial soap and water prior to dressing change. Cleanser: Wound Cleanser 1 x Per Week Discharge Instructions: Cleanse the wound with wound cleanser prior to applying a clean dressing using gauze sponges, not tissue or cotton balls. Peri-Wound Care: Zinc Oxide Ointment 30g tube 1 x Per Week Discharge Instructions: Apply Zinc Oxide  to periwound with each dressing change Peri-Wound Care: Sween Lotion  (Moisturizing lotion) 1 x Per Week Discharge Instructions: Apply moisturizing lotion as directed Prim Dressing: Promogran Prisma Matrix, 4.34 (sq in) (silver collagen) 1 x Per Week ary Discharge Instructions: Moisten collagen with saline or hydrogel Secondary Dressing: ABD Pad, 8x10 1 x Per Week Discharge Instructions: Apply over primary dressing as directed. Secondary Dressing: Zetuvit Plus 4x8 in 1 x Per Week Discharge Instructions: Apply over primary dressing as directed. Secondary Dressing: Drawtex 4x4 in 1 x Per Week Discharge Instructions: Apply over primary dressing as directed. Compression Wrap: FourPress (4 layer compression wrap) 1 x Per Week Discharge Instructions: Apply four layer compression as directed. May also use Miliken CoFlex 2 layer compression system as alternative. Electronic Signature(s) Signed: 04/05/2021 3:31:37 PM By: [name] MD FACS Entered By: [name] on 04/05/2021 15:31:36 -------------------------------------------------------------------------------- Problem List Details Patient Name: Date of Service: Erin Face D. 04/05/2021 2:45 PM Medical Record Number: [id number] Patient Account Number: [id number] Date of Birth/Sex: Treating RN: [birth year] (65 y.o. [name] Primary Care Provider: [name] Other Clinician: Referring Provider: Treating Provider/Extender: [name] in Treatment: 6 Active Problems ICD-10 Encounter Code Description Active Date MDM Diagnosis 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 (idiopathic) with ulcer and inflammation of left 02/21/2021 No Yes lower extremity L97.828 Non-pressure chronic ulcer of other part of left lower leg with other specified 02/21/2021 No Yes severity Inactive Problems ICD-10 Code Description Active Date Inactive Date L03.116 Cellulitis of left lower limb 03/08/2021 03/08/2021 Resolved Problems Electronic Signature(s) Signed: 04/05/2021 3:23:34 PM  By: [name] MD FACS Entered By: [name] on 04/05/2021 15:23:34 -------------------------------------------------------------------------------- Progress Note Details Patient Name: Date of Service: Erin Face D. 04/05/2021 2:45 PM Medical Record Number: [id number] Patient Account Number: [id number] Date of Birth/Sex: Treating RN: 1955/11/10 (66 y.o. [name] Primary Care Provider: [name] Other Clinician: Referring Provider: Treating Provider/Extender: [name] in Treatment: 6 Subjective Chief Complaint Information obtained from Patient 04/05/2021: The patient is here for ongoing follow-up regarding 2 left lower extremity wounds. 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 She tells [name] that she fell on the gravel driveway in July. She developed injuries on her distal lower leg which have not healed. She saw her primary physician on 11/15/2017 who noted her left shin injuries. Gave her antibiotics. At that point the wounds were almost circumferential however most were less than 1.5 cm. Weeping edema fluid was noted. She was referred here for evaluation. The patient has a history of chronic lower extremity edema. 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 She has had prior history with leg weeping fluid. She does not wear compression stockings. She is not doing anything specific to these wound areas. 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 She also  has lymphedema. Put her in 3 layer compression and silver alginate last week. She has 3 small wounds with depth just lateral to the tibia. More problematically than this she has numerous shallow areas some of which are almost canal like in shape with  tightly adherent painful debris. It would be very difficult and time- consuming to go through this and attempt to individually debride all these areas.. I changed her to collagen today to see if that would help with any of the surface debris on some of these wounds. Otherwise we will not be able to put this in compression we have to have someone change the dressing. The patient is not eligible for home health 12/25/17 on evaluation today patient actually appears to be doing rather well in regard to the ulcer on her lower extremity. Fortunately there does not appear to be evidence of infection at this time. She has been tolerating the dressing changes without complication. This includes the compression wrap. The only issue she had was that the wrap was initially placed over her bunion region which actually calls her some discomfort and pain. Other than that things seem to be going rather well. 01/01/2018 Seen today for follow-up and management of left lower extremity wound and lymphedema. T oday she presents with a new wound towards to the left lateral LE. Recently treated with a 7 day course of amoxicillin; reason for antibiotic dose is unknown at this time. Tolerating current treatment of collagen with 4- layer wraps. She obtained a venous reflux study on 12/25/17. Studies show on the right abnormal reflux times of the popliteal vein, great saphenous vein at the saphenofemoral junction at the proximal thigh, great saphenous vein at the mid calf, and origin of the small saphenous vein.No superficial thrombosis. No deep vein thrombosis in the common femoral, femoral,and popliteal veins. Left abnormal reflex times as well of the common femoral vein,  popliteal vein, and a great saphenous vein at the saphenofemoral junction, In great saphenous vein at the mid thigh w/o thrombosis. Has any issues or concerns during visit today. Recommended follow-up to vascular specialist due to abnormalities from the venous reflux study. Denies fever, pain, chills, dizziness, nausea, or vomiting. 01/08/18 upon evaluation today the patient actually seems to be showing some signs of improvement in my opinion at this point in regard to the lower extremity ulcerated areas. She still has a lot of drainage but fortunately nothing that appears to be too significant currently. I have been very happy with the overall progress I see today compared to where things were during the last evaluation that I had with her. Nonetheless she has not had her appointment with the vein specialist as of yet in fact we were able to get this approved for her today and confirmed with them she will be seeing them on December 26. Nonetheless in general I do feel like the compression wraps is doing well for her. 01/17/18; quite a bit of improvement since last time I saw this patient she has a small open area remaining on the left lateral calf and even smaller area medially. She has lymphedema chronic stasis changes with distal skin fibrosis. She has an appointment with vascular surgery later this month 01/24/2018; the patient's medial leg has closed. Still a small open area on the left lateral leg. She states that the 4 layer compression we put on last week was too tight and she had to take it off over a few days ago. We have had resultant increase in her lymphedema in the dorsal foot and a proximal calf. Fortunately that does not seem to have resulted in any deterioration in her wounds The patient is going to need compression stockings. We have given her measurements to phone elastic therapy in [place]. She  has vascular surgery consult on December 26 02/07/18; she's had continuous contraction  on the left lateral leg wound which is now very small. Currently using silver alginate under 3 layer compression She saw Dr. Trula Slade of vascular surgery on 02/06/18. It was noted that she had a normal reflux times in the popliteal vein, great saphenous vein at the saphenofemoral junction, great saphenous vein at the proximal thigh great saphenous vein at the mid calf and origin of the small saphenous vein. It was noted that she had significant reflux in the left saphenous veins with diameter measurements in the 0.7-0.8 cm range. It was felt she would benefit from laser ablation to help minimize the risk of ulcer recurrence. It was recommended that she wear 20-30 thigh-high compression stockings and have follow-up in 4-6 weeks 02/17/2018; I thought this lady would be healed however her compression slipped down and she developed increasing swelling and the wound is actually larger. 1/13; we had deterioration last week after the patient's compression slipped down and she developed periwound swelling. I increased her compression before layers we have been using silver alginate we are a lot better again today. She has her compression stockings in waiting 1/23; the patient's wounds are totally healed today. She has her stockings. This was almost circumferential skin damage. She follows up with Dr. Trula Slade of vascular surgery next Monday. READMISSION 02/21/2021 This is a now 66 year old woman that we had in clinic here discharging in January 2020 with wounds on her left calf chronic venous insufficiency. She was discharged with 30/40 stockings. It does not sound like she has worn stockings in about a year largely from not being able to get them on herself. In November she developed new blisters on her legs an area laterally is opened into a fairly sizable wound. She has weeping posteriorly as well. She has been using Neosporin and Band-Aids. The patient did see Dr. Trula Slade in 2019 and 2020. He felt she might  benefit from laser ablation in her left saphenous veins although because of the wound that healed I do not think he went through with it. She might benefit from seeing him again. Her ABI on the left is 1. 1/17; patient's wound on the posterior left calf is closed she has the 2 large areas last week. We put her in 4-layer compression for edema control is a lot better. Our intake nurse noted greenish drainage and odor. We have been using silver alginate 1/25; PCR culture I did have the substantial wound area on the left lateral lower leg showed staff aureus and group A strep. Low titers of coag negative staph which are probably skin contaminants. Resistance detected to tetracycline methicillin and macrolides. I gave her a starter kit of Nuzyra 150 mg x 3 for 2 days then 300 mg for a further 7 days. Marked odor considerable increase in surrounding erythema. We are using Iodoflex last week however I have changed to silver alginate with underlying Bactroban 2/1; she is completing her [place] tomorrow. The degree of erythema around the wounds looks a lot better. Odor has improved. I gave her silver alginate and Bactroban last week and changing her back to Iodoflex to continue with ongoing debridement 2/8; the periwound looks a lot better. Surface of the wound also looks somewhat better although there is still ongoing debridement to be done we have been using Iodoflex under compression. Primary dressing and silver alginate 2/15; left posterior calf. Some improvement in the surface of the wound but still very gritty  we have been using Iodoflex under compression. 04/05/2021: Left lateral posterior calf. She continues to have significant amounts of drainage, some of which is probably comprised of the Iodoflex microbleeds. There is no odor to the drainage. The satellite lesion on the more posterior aspect of the calf is epithelializing nicely and has contracted quite a bit. There is robust granulation tissue in  the dominant wound with minimal adherent slough. Patient History Information obtained from Patient. Family History Heart Disease - Mother,Father, Hypertension - Mother,Father, Kidney Disease - Mother, Thyroid Problems - Mother, No family history of Cancer, Diabetes, Hereditary Spherocytosis, Lung Disease, Seizures, Stroke, Tuberculosis. Social History Never smoker, Marital Status - Single, Alcohol Use - Never, Drug Use - No History, Caffeine Use - Daily - coffee. Medical History Eyes Patient has history of Cataracts Hematologic/Lymphatic Patient has history of Lymphedema Cardiovascular Patient has history of Hypertension, Peripheral Venous Disease Integumentary (Skin) Denies history of History of Burn Musculoskeletal Patient has history of Osteoarthritis Hospitalization/Surgery History - colostomy reversal. 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 Medical A Surgical History Notes nd Constitutional Symptoms (General Health) morbid obesity Cardiovascular schamberg disease, hyperlipidemia Gastrointestinal diverticulitis , h/o obstruction due to diverticulitis , colostomy and colostomy reversal Endocrine hypothyroidism Objective Constitutional No acute distress. Vitals Time Taken: 2:49 PM, Height: 62 in, Weight: 226 lbs, BMI: 41.3, Temperature: 98 F, Pulse: 97 bpm, Respiratory Rate: 20 breaths/min, Blood Pressure: 151/74 mmHg. Cardiovascular 2+ pitting edema in the left lower extremity; right lower extremity not examined.. General Notes: 04/05/2021: Wound examoothe left posterolateral calf has 2 adjacent lesions. The bridge of new skin between them has increased in size, as the smaller satellite lesion continues to close and contract. The base of the smaller wound has a nice bed of granulation tissue present. The larger area shows an improvement in the character of the tissue at the base. The granulation tissue is somewhat robust, but the necrotic areas noted in the past are no  longer present. There is some gritty adherent slough and a small area of the lesion, which was debrided with a #3 curette. I think it premature to chemically cauterize the granulation tissue, as we are just now getting it really cleaned up from the prior infection and have a good wound base from which to work at this time. Integumentary (Hair, Skin) Wound #3 status is Open. Original cause of wound was Blister. The date acquired was: 12/13/2020. The wound has been in treatment 6 weeks. The wound is located on the Left,Lateral Lower Leg. The wound measures 4.3cm length x 6cm width x 0.3cm depth; 20.263cm^2 area and 6.079cm^3 volume. There is Fat Layer (Subcutaneous Tissue) exposed. There is no tunneling or undermining noted. There is a large amount of serosanguineous drainage noted. The wound margin is flat and intact. There is large (67-100%) pink granulation within the wound bed. There is a small (1-33%) amount of necrotic tissue within the wound bed including Adherent Slough. 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 There was a Excisional Skin/Subcutaneous Tissue Debridement with a total area of 9 sq cm performed by [name], MD. With the following instrument(s): Curette to remove Viable and Non-Viable tissue/material. Material removed includes Subcutaneous Tissue and Slough and after achieving pain  control using Other (benzocaine 20% spray). No specimens were taken. A time out was conducted at 15:05, prior to the start of the procedure. A Minimum amount of bleeding was controlled with Pressure. The procedure was tolerated well with a pain level  of 2 throughout and a pain level of 1 following the procedure. Post Debridement Measurements: 4.3cm length x 6cm width x 0.3cm depth; 6.079cm^3 volume. Character of Wound/Ulcer Post Debridement requires further debridement. Severity of Tissue Post Debridement is: Fat layer exposed. Post procedure Diagnosis Wound #3: Same as Pre-Procedure Pre-procedure diagnosis of Wound #3 is a Venous Leg Ulcer located on the Left,Lateral Lower Leg . There was a Four Layer Compression Therapy Procedure by Baruch Gouty, RN. Post procedure Diagnosis Wound #3: Same as Pre-Procedure Plan Follow-up Appointments: Return Appointment in 1 week. - Dr. Celine Ahr room 3 Bathing/ Shower/ Hygiene: May shower with protection but do not get wound dressing(s) wet. - Ok to use [employer], can purchase at [facility], [facility], or [employer] Edema Control - Lymphedema / SCD / Other: Elevate legs to the level of the heart or above for 30 minutes daily and/or when sitting, a frequency of: - throughout the day Avoid standing for long periods of time. Exercise regularly WOUND #3: - Lower Leg Wound Laterality: Left, Lateral Cleanser: Soap and Water 1 x Per Week/ Discharge Instructions: May shower and wash wound with dial antibacterial soap and water prior to dressing change. Cleanser: Wound Cleanser 1 x Per Week/ Discharge Instructions: Cleanse the wound with wound cleanser prior to applying a clean dressing using gauze sponges, not tissue or cotton balls. Peri-Wound Care: Zinc Oxide Ointment 30g tube 1 x Per Week/ Discharge Instructions: Apply Zinc Oxide to periwound with each dressing change Peri-Wound Care: Sween Lotion (Moisturizing lotion) 1 x Per Week/ Discharge Instructions: Apply moisturizing lotion as directed Prim Dressing: Promogran Prisma Matrix, 4.34 (sq in) (silver collagen) 1 x Per Week/ ary Discharge Instructions: Moisten collagen with saline or hydrogel Secondary Dressing: ABD Pad, 8x10 1 x Per Week/ Discharge  Instructions: Apply over primary dressing as directed. Secondary Dressing: Zetuvit Plus 4x8 in 1 x Per Week/ Discharge Instructions: Apply over primary dressing as directed. Secondary Dressing: Drawtex 4x4 in 1 x Per Week/ Discharge Instructions: Apply over primary dressing as directed. Com pression Wrap: FourPress (4 layer compression wrap) 1 x Per Week/ Discharge Instructions: Apply four layer compression as directed. May also use Miliken CoFlex 2 layer compression system as alternative. 04/05/2021: Overall, I think good progress is being made in both the larger wound and the satellite lesion. In particular, the satellite lesion is demonstrating really nice epithelialization and contracture. The larger lesion is certainly much cleaner than at previous visits and only required a small amount of debridement. We are going to try collagen with Dura flex to try and manage the drainage and try to stimulate additional islands of granulation tissue. We will continue with 4- layer compression wrapping. Depending on the wounds' appearance next week, I may consider chemo cauterization of the granulation tissue. We were unable to get PuraPly cleared through insurance. Electronic Signature(s) Signed: 04/05/2021 3:35:32 PM By: [name] MD FACS Entered By: [name] on 04/05/2021 15:35:32 -------------------------------------------------------------------------------- HxROS Details Patient Name: Date of Service: [name] 04/05/2021 2:45 PM Medical Record Number: [id number] Patient Account Number: [id number] Date of Birth/Sex: Treating RN: 11-21-1955 (66 y.o. [name] Primary Care Provider: [name] Other Clinician: Referring Provider: Treating Provider/Extender: [name] in Treatment: 6 Information Obtained From  Patient Constitutional Symptoms (General Health) Medical History: Past Medical History Notes: morbid  obesity Eyes Medical History: Positive for: Cataracts Hematologic/Lymphatic Medical History: Positive for: Lymphedema Cardiovascular Medical History: Positive for: Hypertension; Peripheral Venous Disease Past Medical History Notes: schamberg disease, hyperlipidemia Gastrointestinal Medical History: Past Medical History Notes: diverticulitis , h/o obstruction due to diverticulitis , colostomy and colostomy reversal Endocrine Medical History: Past Medical History Notes: hypothyroidism Integumentary (Skin) Medical History: Negative for: History of Burn Musculoskeletal Medical History: Positive for: Osteoarthritis HBO Extended History Items Eyes: Cataracts Immunizations Pneumococcal Vaccine: Received Pneumococcal Vaccination: Yes Received Pneumococcal Vaccination On or After 60th Birthday: Yes Implantable Devices None Hospitalization / Surgery History Type of Hospitalization/Surgery colostomy reversal 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 Family and Social History Cancer: No; Diabetes: No; Heart Disease: Yes - Mother,Father; Hereditary Spherocytosis: No; Hypertension: Yes - Mother,Father; Kidney Disease: Yes - Mother; Lung Disease: No; Seizures: No; Stroke: No; Thyroid Problems: Yes - Mother; Tuberculosis: No; Never smoker; Marital Status - Single; Alcohol Use: Never; Drug Use: No History; Caffeine Use: Daily - coffee; Financial Concerns: No; Food, Clothing or Shelter Needs: No; Support System Lacking: No; Transportation Concerns: No Physician Affirmation I have reviewed and agree with the above information. Electronic Signature(s) Signed: 04/05/2021 3:36:17 PM By: [name] MD FACS Signed: 04/05/2021 5:17:37 PM By: Baruch Gouty RN, BSN Entered By: [name] on 04/05/2021 15:26:42 -------------------------------------------------------------------------------- SuperBill Details Patient Name: Date of Service: Erin Face D. 04/05/2021 Medical Record  Number: [id number] Patient Account Number: [id number] Date of Birth/Sex: Treating RN: [birth year] (66 y.o. Martyn Malay, Linda Primary Care Provider: [name] Other Clinician: Referring Provider: Treating Provider/Extender: [name] in Treatment: 6 Diagnosis Coding ICD-10 Codes Code Description [id number] Chronic venous hypertension (idiopathic) with ulcer and inflammation of left lower extremity L97.828 Non-pressure chronic ulcer of other part of left lower leg with other specified severity L03.116 Cellulitis of left lower limb Facility Procedures CPT4 Code: [id number] Description: 18343 - DEB SUBQ TISSUE 20 SQ CM/< ICD-10 Diagnosis Description L97.828 Non-pressure chronic ulcer of other part of left lower leg with other specified Modifier: severity Quantity: 1 Physician Procedures : CPT4 Code Description Modifier [id number] 11042 - WC PHYS SUBQ TISS 20 SQ CM ICD-10 Diagnosis Description L97.828 Non-pressure chronic ulcer of other part of left lower leg with other specified severity Quantity: 1 Electronic Signature(s) Signed: 04/05/2021 3:35:53 PM By: [name] MD FACS Entered By: [name] on 04/05/2021 15:35:53

## 2021-04-06 NOTE — Progress Notes (Signed)
MILICA, GULLY ([id number]) Visit Report for 04/05/2021 Arrival Information Details Patient Name: Date of Service: MACKINSEY, PELLAND 04/05/2021 2:45 PM Medical Record Number: [id number] Patient Account Number: [id number] Date of Birth/Sex: Treating RN: Apr 28, 1955 (66 y.o. Martyn Malay, Linda Primary Care Shaana Acocella: [name] Other Clinician: Referring Jahmarion Popoff: Treating Ayodeji Keimig/Extender: [name] in Treatment: 6 Visit Information History Since Last Visit Added or deleted any medications: No Patient Arrived: [name] Any new allergies or adverse reactions: No Arrival Time: 14:44 Had a fall or experienced change in No Accompanied By: self activities of daily living that may affect Transfer Assistance: None risk of falls: Patient Identification Verified: Yes Signs or symptoms of abuse/neglect since last visito No Secondary Verification Process Completed: Yes Hospitalized since last visit: No Patient Requires Transmission-Based Precautions: No Implantable device outside of the clinic excluding No Patient Has Alerts: No cellular tissue based products placed in the center since last visit: Has Dressing in Place as Prescribed: Yes Has Compression in Place as Prescribed: Yes Pain Present Now: Yes Electronic Signature(s) Signed: 04/05/2021 5:17:37 PM By: Baruch Gouty RN, BSN Entered By: Baruch Gouty on 04/05/2021 14:47:44 -------------------------------------------------------------------------------- Compression Therapy Details Patient Name: Date of Service: [name] 04/05/2021 2:45 PM Medical Record Number: [id number] Patient Account Number: [id number] Date of Birth/Sex: Treating RN: [birth year] (65 y.o. [name] Primary Care Yaritzi Craun: [name] Other Clinician: Referring Arneta Mahmood: Treating Miyah Hampshire/Extender: [name] in Treatment: 6 Compression Therapy Performed for Wound Assessment:  Wound #3 Left,Lateral Lower Leg Performed By: Clinician Baruch Gouty, RN Compression Type: Four Layer Post Procedure Diagnosis Same as Pre-procedure Electronic Signature(s) Signed: 04/05/2021 5:17:37 PM By: Baruch Gouty RN, BSN Entered By: Baruch Gouty on 04/05/2021 15:04:22 -------------------------------------------------------------------------------- Encounter Discharge Information Details Patient Name: Date of Service: [name] 04/05/2021 2:45 PM Medical Record Number: [id number] Patient Account Number: [id number] Date of Birth/Sex: Treating RN: 26-Jan-1956 (65 y.o. [name] Primary Care Radwan Cowley: [name] Other Clinician: Referring Eldo Umanzor: Treating Cylah Fannin/Extender: [name] in Treatment: 6 Encounter Discharge Information Items Post Procedure Vitals Discharge Condition: Stable Temperature (F): 98 Ambulatory Status: Cane Pulse (bpm): 97 Discharge Destination: Home Respiratory Rate (breaths/min): 18 Transportation: Private Auto Blood Pressure (mmHg): 151/74 Accompanied By: self Schedule Follow-up Appointment: Yes Clinical Summary of Care: Patient Declined Electronic Signature(s) Signed: 04/05/2021 5:17:37 PM By: Baruch Gouty RN, BSN Entered By: Baruch Gouty on 04/05/2021 15:46:01 -------------------------------------------------------------------------------- Lower Extremity Assessment Details Patient Name: Date of Service: [name] 04/05/2021 2:45 PM Medical Record Number: [id number] Patient Account Number: [id number] Date of Birth/Sex: Treating RN: 1955/11/10 (66 y.o. [name] Primary Care Terrelle Ruffolo: [name] Other Clinician: Referring Aryaan Persichetti: Treating Trayven Lumadue/Extender: [name] in Treatment: 6 Edema Assessment Assessed: [Left: No] [Right: No] Edema: [Left: Ye] [Right: s] Calf Left: Right: Point of Measurement: 29 cm From Medial  Instep 38.5 cm Ankle Left: Right: Point of Measurement: From Medial Instep 22.3 cm Vascular Assessment Pulses: Dorsalis Pedis Palpable: [Left:No] Electronic Signature(s) Signed: 04/05/2021 5:17:37 PM By: Baruch Gouty RN, BSN Entered By: Baruch Gouty on 04/05/2021 14:58:53 -------------------------------------------------------------------------------- Multi Wound Chart Details Patient Name: Date of Service: [name] 04/05/2021 2:45 PM Medical Record Number: [id number] Patient Account Number: [id number] Date of Birth/Sex: Treating RN: 10-04-1955 (66 y.o. [name] Primary Care Somer Trotter: [name] Other Clinician: Referring Samuell Knoble: Treating Irvan Tiedt/Extender: [name] in Treatment: 6 Vital Signs Height(in): 62 Pulse(bpm): 97 Weight(lbs): 226 Blood Pressure(mmHg): 151/74 Body  Mass Index(BMI): 41.3 Temperature(F): 98 Respiratory Rate(breaths/min): 20 Photos: [N/A:N/A] Left, Lateral Lower Leg N/A N/A Wound Location: Blister N/A N/A Wounding Event: Venous Leg Ulcer N/A N/A Primary Etiology: Cataracts, Lymphedema, N/A N/A Comorbid History: Hypertension, Peripheral Venous Disease, Osteoarthritis 12/13/2020 N/A N/A Date Acquired: 6 N/A N/A Weeks of Treatment: Open N/A N/A Wound Status: No N/A N/A Wound Recurrence: 4.3x6x0.3 N/A N/A Measurements L x W x D (cm) 20.263 N/A N/A A (cm) : rea 6.079 N/A N/A Volume (cm) : 30.30% N/A N/A % Reduction in A rea: -4.60% N/A N/A % Reduction in Volume: Full Thickness Without Exposed N/A N/A Classification: Support Structures Large N/A N/A Exudate A mount: Serosanguineous N/A N/A Exudate Type: red, brown N/A N/A Exudate Color: Flat and Intact N/A N/A Wound Margin: Large (67-100%) N/A N/A Granulation A mount: Pink N/A N/A Granulation Quality: Small (1-33%) N/A N/A Necrotic A mount: Fat Layer (Subcutaneous Tissue): Yes N/A N/A Exposed  Structures: Fascia: No Tendon: No Muscle: No Joint: No Bone: No Small (1-33%) N/A N/A Epithelialization: Debridement - Excisional N/A N/A Debridement: Pre-procedure Verification/Time Out 15:05 N/A N/A Taken: Other N/A N/A Pain Control: Subcutaneous, Slough N/A N/A Tissue Debrided: Skin/Subcutaneous Tissue N/A N/A Level: 9 N/A N/A Debridement A (sq cm): rea Curette N/A N/A Instrument: Minimum N/A N/A Bleeding: Pressure N/A N/A Hemostasis A chieved: 2 N/A N/A Procedural Pain: 1 N/A N/A Post Procedural Pain: Procedure was tolerated well N/A N/A Debridement Treatment Response: 4.3x6x0.3 N/A N/A Post Debridement Measurements L x W x D (cm) 6.079 N/A N/A Post Debridement Volume: (cm) Compression Therapy N/A N/A Procedures Performed: Debridement Treatment Notes Electronic Signature(s) Signed: 04/05/2021 3:24:01 PM By: [name] MD FACS Signed: 04/05/2021 5:17:37 PM By: Baruch Gouty RN, BSN Entered By: [name] on 04/05/2021 15:24:01 -------------------------------------------------------------------------------- Multi-Disciplinary Care Plan Details Patient Name: Date of Service: [name] 04/05/2021 2:45 PM Medical Record Number: [id number] Patient Account Number: [id number] Date of Birth/Sex: Treating RN: 09-May-1955 (66 y.o. [name] Primary Care Roxine Whittinghill: [name] Other Clinician: Referring Sonal Dorwart: Treating Ravan Schlemmer/Extender: [name] in Treatment: 6 Multidisciplinary Care Plan reviewed with physician Active Inactive Abuse / Safety / Falls / Self Care Management Nursing Diagnoses: Potential for falls Potential for injury related to falls Goals: Patient will not experience any injury related to falls Date Initiated: 02/21/2021 Target Resolution Date: 04/21/2021 Goal Status: Active Patient/caregiver will verbalize/demonstrate measures taken to prevent injury and/or falls Date  Initiated: 02/21/2021 Target Resolution Date: 04/21/2021 Goal Status: Active Interventions: Assess Activities of Daily Living upon admission and as needed Assess fall risk on admission and as needed Assess: immobility, friction, shearing, incontinence upon admission and as needed Assess impairment of mobility on admission and as needed per policy Assess personal safety and home safety (as indicated) on admission and as needed Assess self care needs on admission and as needed Provide education on fall prevention Provide education on personal and home safety Notes: Venous Leg Ulcer Nursing Diagnoses: Actual venous Insuffiency (use after diagnosis is confirmed) Goals: Patient will maintain optimal edema control Date Initiated: 02/21/2021 Target Resolution Date: 04/21/2021 Goal Status: Active Patient/caregiver will verbalize understanding of disease process and disease management Date Initiated: 02/21/2021 Date Inactivated: 03/29/2021 Target Resolution Date: 03/24/2021 Goal Status: Met Interventions: Assess peripheral edema status every visit. Compression as ordered Provide education on venous insufficiency Notes: Wound/Skin Impairment Nursing Diagnoses: Impaired tissue integrity Knowledge deficit related to ulceration/compromised skin integrity Goals: Patient/caregiver will verbalize understanding of skin care regimen Date Initiated: 02/21/2021 Target Resolution Date:  04/21/2021 Goal Status: Active Ulcer/skin breakdown will have a volume reduction of 30% by week 4 Date Initiated: 02/21/2021 Date Inactivated: 03/29/2021 Target Resolution Date: 03/24/2021 Goal Status: Unmet Unmet Reason: infection Ulcer/skin breakdown will have a volume reduction of 50% by week 8 Date Initiated: 03/29/2021 Target Resolution Date: 04/21/2021 Goal Status: Active Interventions: Assess patient/caregiver ability to obtain necessary supplies Assess patient/caregiver ability to perform ulcer/skin care  regimen upon admission and as needed Assess ulceration(s) every visit Provide education on ulcer and skin care Notes: Electronic Signature(s) Signed: 04/05/2021 5:17:37 PM By: Baruch Gouty RN, BSN Entered By: Baruch Gouty on 04/05/2021 15:01:58 -------------------------------------------------------------------------------- Pain Assessment Details Patient Name: Date of Service: [name] 04/05/2021 2:45 PM Medical Record Number: [id number] Patient Account Number: [id number] Date of Birth/Sex: Treating RN: 12/29/1955 (66 y.o. [name] Primary Care Kaenan Jake: [name] Other Clinician: Referring Noel Henandez: Treating Keo Schirmer/Extender: [name] in Treatment: 6 Active Problems Location of Pain Severity and Description of Pain Patient Has Paino Yes Site Locations Pain Location: Pain in Ulcers With Dressing Change: Yes Duration of the Pain. Constant / Intermittento Intermittent Rate the pain. Current Pain Level: 2 Character of Pain Describe the Pain: Tender, Other: sore Pain Management and Medication Current Pain Management: Medication: Yes Is the Current Pain Management Adequate: Adequate How does your wound impact your activities of daily livingo Sleep: No Bathing: No Appetite: No Relationship With Others: No Bladder Continence: No Emotions: No Bowel Continence: No Work: No Toileting: No Drive: No Dressing: No Hobbies: No Engineer, maintenance) Signed: 04/05/2021 5:17:37 PM By: Baruch Gouty RN, BSN Entered By: Baruch Gouty on 04/05/2021 14:50:15 -------------------------------------------------------------------------------- Patient/Caregiver Education Details Patient Name: Date of Service: [name] 2/22/2023andnbsp2:45 PM Medical Record Number: [id number] Patient Account Number: [id number] Date of Birth/Gender: Treating RN: 1955/04/16 (66 y.o. [name] Primary Care Physician:  [name] Other Clinician: Referring Physician: Treating Physician/Extender: [name] in Treatment: 6 Education Assessment Education Provided To: Patient Education Topics Provided Venous: Methods: Explain/Verbal Responses: Reinforcements needed, State content correctly Wound/Skin Impairment: Methods: Explain/Verbal Responses: Reinforcements needed, State content correctly Electronic Signature(s) Signed: 04/05/2021 5:17:37 PM By: Baruch Gouty RN, BSN Entered By: Baruch Gouty on 04/05/2021 15:02:23 -------------------------------------------------------------------------------- Wound Assessment Details Patient Name: Date of Service: [name] 04/05/2021 2:45 PM Medical Record Number: [id number] Patient Account Number: [id number] Date of Birth/Sex: Treating RN: May 24, 1955 (66 y.o. [name] Primary Care Nylan Nakatani: [name] Other Clinician: Referring Deloyd Handy: Treating Sumedh Shinsato/Extender: [name] in Treatment: 6 Wound Status Wound Number: 3 Primary Venous Leg Ulcer Etiology: Etiology: Wound Location: Left, Lateral Lower Leg Wound Open Wounding Event: Blister Status: Date Acquired: 12/13/2020 Comorbid Cataracts, Lymphedema, Hypertension, Peripheral Venous Weeks Of Treatment: 6 History: Disease, Osteoarthritis Clustered Wound: No Photos Wound Measurements Length: (cm) 4.3 Width: (cm) 6 Depth: (cm) 0.3 Area: (cm) 20.263 Volume: (cm) 6.079 % Reduction in Area: 30.3% % Reduction in Volume: -4.6% Epithelialization: Small (1-33%) Tunneling: No Undermining: No Wound Description Classification: Full Thickness Without Exposed Support Structures Wound Margin: Flat and Intact Exudate Amount: Large Exudate Type: Serosanguineous Exudate Color: red, brown Foul Odor After Cleansing: No Slough/Fibrino Yes Wound Bed Granulation Amount: Large (67-100%) Exposed Structure Granulation  Quality: Pink Fascia Exposed: No Necrotic Amount: Small (1-33%) Fat Layer (Subcutaneous Tissue) Exposed: Yes Necrotic Quality: Adherent Slough Tendon Exposed: No Muscle Exposed: No Joint Exposed: No Bone Exposed: No Treatment Notes Wound #3 (Lower Leg) Wound Laterality: Left, Lateral Cleanser Soap and Water Discharge Instruction:  May shower and wash wound with dial antibacterial soap and water prior to dressing change. Wound Cleanser Discharge Instruction: Cleanse the wound with wound cleanser prior to applying a clean dressing using gauze sponges, not tissue or cotton balls. Peri-Wound Care Zinc Oxide Ointment 30g tube Discharge Instruction: Apply Zinc Oxide to periwound with each dressing change Sween Lotion (Moisturizing lotion) Discharge Instruction: Apply moisturizing lotion as directed Topical Primary Dressing Promogran Prisma Matrix, 4.34 (sq in) (silver collagen) Discharge Instruction: Moisten collagen with saline or hydrogel Secondary Dressing ABD Pad, 8x10 Discharge Instruction: Apply over primary dressing as directed. Zetuvit Plus 4x8 in Discharge Instruction: Apply over primary dressing as directed. Drawtex 4x4 in Discharge Instruction: Apply over primary dressing as directed. Secured With Compression Wrap FourPress (4 layer compression wrap) Discharge Instruction: Apply four layer compression as directed. May also use Miliken CoFlex 2 layer compression system as alternative. Compression Stockings Add-Ons Electronic Signature(s) Signed: 04/05/2021 5:17:37 PM By: Baruch Gouty RN, BSN Signed: 04/06/2021 2:56:00 PM By: Sandre Kitty Entered By: Sandre Kitty on 04/05/2021 15:03:52 -------------------------------------------------------------------------------- Vitals Details Patient Name: Date of Service: [name] 04/05/2021 2:45 PM Medical Record Number: [id number] Patient Account Number: [id number] Date of Birth/Sex: Treating RN: 03-09-1955  (66 y.o. [name] Primary Care Mylo Driskill: [name] Other Clinician: Referring Alyjah Lovingood: Treating Orva Gwaltney/Extender: [name] in Treatment: 6 Vital Signs Time Taken: 14:49 Temperature (F): 98 Height (in): 62 Pulse (bpm): 97 Weight (lbs): 226 Respiratory Rate (breaths/min): 20 Body Mass Index (BMI): 41.3 Blood Pressure (mmHg): 151/74 Reference Range: 80 - 120 mg / dl Electronic Signature(s) Signed: 04/05/2021 5:17:37 PM By: Baruch Gouty RN, BSN Entered By: Baruch Gouty on 04/05/2021 14:49:42

## 2021-04-12 ENCOUNTER — Encounter (HOSPITAL_BASED_OUTPATIENT_CLINIC_OR_DEPARTMENT_OTHER): Payer: 59 | Attending: General Surgery | Admitting: General Surgery

## 2021-04-12 ENCOUNTER — Other Ambulatory Visit: Payer: Self-pay

## 2021-04-12 DIAGNOSIS — E669 Obesity, unspecified: Secondary | ICD-10-CM | POA: Diagnosis not present

## 2021-04-12 DIAGNOSIS — Z6841 Body Mass Index (BMI) 40.0 and over, adult: Secondary | ICD-10-CM | POA: Insufficient documentation

## 2021-04-12 DIAGNOSIS — I872 Venous insufficiency (chronic) (peripheral): Secondary | ICD-10-CM | POA: Diagnosis not present

## 2021-04-12 DIAGNOSIS — M1990 Unspecified osteoarthritis, unspecified site: Secondary | ICD-10-CM | POA: Insufficient documentation

## 2021-04-12 DIAGNOSIS — L97828 Non-pressure chronic ulcer of other part of left lower leg with other specified severity: Secondary | ICD-10-CM | POA: Diagnosis not present

## 2021-04-12 DIAGNOSIS — I70248 Atherosclerosis of native arteries of left leg with ulceration of other part of lower left leg: Secondary | ICD-10-CM | POA: Diagnosis not present

## 2021-04-12 DIAGNOSIS — L97822 Non-pressure chronic ulcer of other part of left lower leg with fat layer exposed: Secondary | ICD-10-CM | POA: Diagnosis not present

## 2021-04-12 DIAGNOSIS — L97222 Non-pressure chronic ulcer of left calf with fat layer exposed: Secondary | ICD-10-CM | POA: Diagnosis not present

## 2021-04-12 DIAGNOSIS — I10 Essential (primary) hypertension: Secondary | ICD-10-CM | POA: Diagnosis not present

## 2021-04-12 NOTE — Progress Notes (Signed)
[name], [name] ([id number]) Visit Report for 04/12/2021 Chief Complaint Document Details Patient Name: Date of Service: [name], [name] 04/12/2021 2:45 PM Medical Record Number: [id number] Patient Account Number: [id number] Date of Birth/Sex: Treating RN: 1955/03/29 (66 y.o. F) Primary Care Provider: [name] Other Clinician: Referring Provider: Treating Provider/Extender: [name] in Treatment: 7 Information Obtained from: Patient Chief Complaint 04/12/2021: The patient is here for ongoing follow-up regarding 2 left lower extremity wounds. Electronic Signature(s) Signed: 04/12/2021 3:56:57 PM By: [name] MD FACS Entered By: [name] on 04/12/2021 15:56:57 -------------------------------------------------------------------------------- Debridement Details Patient Name: Date of Service: Erin Gerold [name] 04/12/2021 2:45 PM Medical Record Number: [id number] Patient Account Number: [id number] Date of Birth/Sex: Treating RN: February 08, 1956 (66 y.o. [name] Primary Care Provider: [name] Other Clinician: Referring Provider: Treating Provider/Extender: [name] in Treatment: 7 Debridement Performed for Assessment: Wound #3 Left,Lateral Lower Leg Performed By: Physician [name], MD Debridement Type: Debridement Severity of Tissue Pre Debridement: Fat layer exposed Level of Consciousness (Pre-procedure): Awake and Alert Pre-procedure Verification/Time Out Yes - 15:23 Taken: Start Time: 15:23 Pain Control: Other : Benzocaine 20% T Area Debrided (L x W): otal 3.1 (cm) x 5 (cm) = 15.5 (cm) Tissue and other material debrided: Non-Viable, Slough, Subcutaneous, Slough Level: Skin/Subcutaneous Tissue Debridement Description: Excisional Instrument: Curette Bleeding: Minimum Hemostasis Achieved: Pressure End Time: 15:26 Procedural Pain: 1 Post Procedural Pain: 1 Response to  Treatment: Procedure was tolerated well Level of Consciousness (Post- Awake and Alert procedure): Post Debridement Measurements of Total Wound Length: (cm) 3.1 Width: (cm) 5 Depth: (cm) 0.2 Volume: (cm) 2.435 Character of Wound/Ulcer Post Debridement: Improved Severity of Tissue Post Debridement: Fat layer exposed Post Procedure Diagnosis Same as Pre-procedure Electronic Signature(s) Signed: 04/12/2021 4:03:07 PM By: [name] MD FACS Signed: 04/12/2021 4:14:19 PM By: [name] RN Entered By: [name] on 04/12/2021 15:29:29 -------------------------------------------------------------------------------- Debridement Details Patient Name: Date of Service: Erin Face D. 04/12/2021 2:45 PM Medical Record Number: [id number] Patient Account Number: [id number] Date of Birth/Sex: Treating RN: 10-04-1955 (67 y.o. [name] Primary Care Provider: [name] Other Clinician: Referring Provider: Treating Provider/Extender: [name] in Treatment: 7 Debridement Performed for Assessment: Wound #5 Left,Posterior Lower Leg Performed By: Physician [name], MD Debridement Type: Debridement Severity of Tissue Pre Debridement: Fat layer exposed Level of Consciousness (Pre-procedure): Awake and Alert Pre-procedure Verification/Time Out Yes - 15:23 Taken: Start Time: 15:23 Pain Control: Other : Benzocaine 20% T Area Debrided (L x W): otal 1.5 (cm) x 1.5 (cm) = 2.25 (cm) Tissue and other material debrided: Non-Viable, Slough, Subcutaneous, Slough Level: Skin/Subcutaneous Tissue Debridement Description: Excisional Instrument: Curette Bleeding: Minimum Hemostasis Achieved: Pressure End Time: 15:26 Procedural Pain: 1 Post Procedural Pain: 1 Response to Treatment: Procedure was tolerated well Level of Consciousness (Post- Awake and Alert procedure): Post Debridement Measurements of Total Wound Length: (cm) 1.5 Width:  (cm) 1.5 Depth: (cm) 0.1 Volume: (cm) 0.177 Character of Wound/Ulcer Post Debridement: Improved Severity of Tissue Post Debridement: Fat layer exposed Post Procedure Diagnosis Same as Pre-procedure Electronic Signature(s) Signed: 04/12/2021 4:03:07 PM By: [name] MD FACS Signed: 04/12/2021 4:14:19 PM By: [name] RN Entered By: [name] on 04/12/2021 15:32:33 -------------------------------------------------------------------------------- HPI Details Patient Name: Date of Service: Erin Face D. 04/12/2021 2:45 PM Medical Record Number: [id number] Patient Account Number: [id number] Date of Birth/Sex: Treating RN: 03-09-1955 (66 y.o. 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 She tells [name] that she fell on the gravel driveway in July. She developed injuries on her distal lower leg which have not healed. She saw her primary physician on 11/15/2017 who noted her left shin injuries. Gave her antibiotics. At that point the wounds were almost circumferential however most were less than 1.5 cm. Weeping edema fluid was noted. She was referred here for evaluation. The patient has a history of chronic lower extremity edema. 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 She has had prior history with leg weeping fluid. She does not wear compression stockings. She is not doing anything specific to these wound areas. The patient has a history of obesity, arthritis, peripheral vascular disease hypertension lower extremity edema and Schamberg's disease ABI in our clinic was 1.3 on the left 12/17/2017; patient readmitted  to the clinic last week. 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 She also has lymphedema. Put her in 3 layer compression and silver alginate last week. She has 3 small wounds with depth just lateral to the tibia. More problematically than this she has numerous shallow areas some of which are almost canal like in shape with tightly adherent painful debris. It would be very difficult and time- consuming to go through this and attempt to individually debride all these areas.. I changed her to collagen today to see if that would help with any of the surface debris on some of these wounds. Otherwise we will not be able to put this in compression we have to have someone change the dressing. The patient is not eligible for home health 12/25/17 on evaluation today patient actually appears to be doing rather well in regard to the ulcer on her lower extremity. Fortunately there does not appear to be evidence of infection at this time. She has been tolerating the dressing changes without complication. This includes the compression wrap. The only issue she had was that the wrap was initially placed over her bunion region which actually calls her some discomfort and pain. Other than that things seem to be going rather well. 01/01/2018 Seen today for follow-up and management of left lower extremity wound and lymphedema. T oday she presents with a new wound towards to the left lateral LE. Recently treated with a 7 day course of amoxicillin; reason for antibiotic dose is unknown at this time. Tolerating current treatment of collagen with 4- layer wraps. She obtained a venous reflux study on 12/25/17. Studies show on the right abnormal reflux times of the popliteal vein, great saphenous vein at the saphenofemoral junction at the proximal thigh, great saphenous vein at the mid calf, and origin of the small saphenous vein.No superficial thrombosis. No deep vein  thrombosis in the common femoral, femoral,and popliteal veins. Left abnormal reflex times as well of the common femoral vein, popliteal vein, and a great saphenous vein at the saphenofemoral junction, In great saphenous vein at the mid thigh w/o thrombosis. Has any issues or concerns during visit today. Recommended follow-up to vascular specialist due to abnormalities from the venous reflux study. Denies fever, pain, chills, dizziness, nausea, or vomiting. 01/08/18 upon evaluation today the patient actually seems to be showing some signs of improvement in my opinion at  this point in regard to the lower extremity ulcerated areas. She still has a lot of drainage but fortunately nothing that appears to be too significant currently. I have been very happy with the overall progress I see today compared to where things were during the last evaluation that I had with her. Nonetheless she has not had her appointment with the vein specialist as of yet in fact we were able to get this approved for her today and confirmed with them she will be seeing them on December 26. Nonetheless in general I do feel like the compression wraps is doing well for her. 01/17/18; quite a bit of improvement since last time I saw this patient she has a small open area remaining on the left lateral calf and even smaller area medially. She has lymphedema chronic stasis changes with distal skin fibrosis. She has an appointment with vascular surgery later this month 01/24/2018; the patient's medial leg has closed. Still a small open area on the left lateral leg. She states that the 4 layer compression we put on last week was too tight and she had to take it off over a few days ago. We have had resultant increase in her lymphedema in the dorsal foot and a proximal calf. Fortunately that does not seem to have resulted in any deterioration in her wounds The patient is going to need compression stockings. We have given her measurements to  phone elastic therapy in [place]. She has vascular surgery consult on December 26 02/07/18; she's had continuous contraction on the left lateral leg wound which is now very small. Currently using silver alginate under 3 layer compression She saw Dr. Trula Slade of vascular surgery on 02/06/18. It was noted that she had a normal reflux times in the popliteal vein, great saphenous vein at the saphenofemoral junction, great saphenous vein at the proximal thigh great saphenous vein at the mid calf and origin of the small saphenous vein. It was noted that she had significant reflux in the left saphenous veins with diameter measurements in the 0.7-0.8 cm range. It was felt she would benefit from laser ablation to help minimize the risk of ulcer recurrence. It was recommended that she wear 20-30 thigh-high compression stockings and have follow-up in 4-6 weeks 02/17/2018; I thought this lady would be healed however her compression slipped down and she developed increasing swelling and the wound is actually larger. 1/13; we had deterioration last week after the patient's compression slipped down and she developed periwound swelling. I increased her compression before layers we have been using silver alginate we are a lot better again today. She has her compression stockings in waiting 1/23; the patient's wounds are totally healed today. She has her stockings. This was almost circumferential skin damage. She follows up with Dr. Trula Slade of vascular surgery next Monday. READMISSION 02/21/2021 This is a now 66 year old woman that we had in clinic here discharging in January 2020 with wounds on her left calf chronic venous insufficiency. She was discharged with 30/40 stockings. It does not sound like she has worn stockings in about a year largely from not being able to get them on herself. In November she developed new blisters on her legs an area laterally is opened into a fairly sizable wound. She has weeping  posteriorly as well. She has been using Neosporin and Band-Aids. The patient did see Dr. Trula Slade in 2019 and 2020. He felt she might benefit from laser ablation in her left saphenous veins although because of the wound  that healed I do not think he went through with it. She might benefit from seeing him again. Her ABI on the left is 1. 1/17; patient's wound on the posterior left calf is closed she has the 2 large areas last week. We put her in 4-layer compression for edema control is a lot better. Our intake nurse noted greenish drainage and odor. We have been using silver alginate 1/25; PCR culture I did have the substantial wound area on the left lateral lower leg showed staff aureus and group A strep. Low titers of coag negative staph which are probably skin contaminants. Resistance detected to tetracycline methicillin and macrolides. I gave her a starter kit of Nuzyra 150 mg x 3 for 2 days then 300 mg for a further 7 days. Marked odor considerable increase in surrounding erythema. We are using Iodoflex last week however I have changed to silver alginate with underlying Bactroban 2/1; she is completing her [place] tomorrow. The degree of erythema around the wounds looks a lot better. Odor has improved. I gave her silver alginate and Bactroban last week and changing her back to Iodoflex to continue with ongoing debridement 2/8; the periwound looks a lot better. Surface of the wound also looks somewhat better although there is still ongoing debridement to be done we have been using Iodoflex under compression. Primary dressing and silver alginate 2/15; left posterior calf. Some improvement in the surface of the wound but still very gritty we have been using Iodoflex under compression. 04/05/2021: Left lateral posterior calf. She continues to have significant amounts of drainage, some of which is probably comprised of the Iodoflex microbleeds. There is no odor to the drainage. The satellite lesion on  the more posterior aspect of the calf is epithelializing nicely and has contracted quite a bit. There is robust granulation tissue in the dominant wound with minimal adherent slough. 04/12/2021: The satellite lesion has nearly closed. She continues to have good granulation tissue with minimal slough of the larger primary wound. She continues to have a fair amount of drainage, but I think this is less secondary to switching her dressing from Iodoflex to Prisma. Electronic Signature(s) Signed: 04/12/2021 3:58:04 PM By: [name] MD FACS Entered By: [name] on 04/12/2021 15:58:04 -------------------------------------------------------------------------------- Physical Exam Details Patient Name: Date of Service: Erin Gerold [name] 04/12/2021 2:45 PM Medical Record Number: [id number] Patient Account Number: [id number] Date of Birth/Sex: Treating RN: March 02, 1955 (66 y.o. F) Primary Care Provider: [name] Other Clinician: Referring Provider: Treating Provider/Extender: [name] in Treatment: 7 Constitutional . . . . She is obese.. No acute distress. Respiratory Normal work of breathing on room air. Electronic Signature(s) Signed: 04/12/2021 3:59:54 PM By: [name] MD FACS Entered By: [name] on 04/12/2021 15:59:54 -------------------------------------------------------------------------------- Physician Orders Details Patient Name: Date of Service: Erin Gerold [name] 04/12/2021 2:45 PM Medical Record Number: [id number] Patient Account Number: [id number] Date of Birth/Sex: Treating RN: Jun 11, 1955 (66 y.o. [name] Primary Care Provider: [name] Other Clinician: Referring Provider: Treating Provider/Extender: [name] in Treatment: 7 Verbal / Phone Orders: No Diagnosis Coding Follow-up Appointments ppointment in 1 week. - Dr. Celine Ahr room 3 Return A Bathing/ Shower/ Hygiene May  shower with protection but do not get wound dressing(s) wet. - Ok to use [employer], can purchase at [facility], [facility], or [employer] Edema Control - Lymphedema / SCD / Other Elevate legs to the level of the heart or above for 30 minutes daily and/or when  sitting, a frequency of: - throughout the day Avoid standing for long periods of time. Exercise regularly Wound Treatment Wound #3 - Lower Leg Wound Laterality: Left, Lateral Peri-Wound Care: Zinc Oxide Ointment 30g tube 1 x Per Week Discharge Instructions: Apply Zinc Oxide to periwound with each dressing change Peri-Wound Care: Sween Lotion (Moisturizing lotion) 1 x Per Week Discharge Instructions: Apply moisturizing lotion as directed Prim Dressing: Promogran Prisma Matrix, 4.34 (sq in) (silver collagen) 1 x Per Week ary Discharge Instructions: Moisten collagen with saline or hydrogel Secondary Dressing: ABD Pad, 8x10 1 x Per Week Discharge Instructions: Apply over primary dressing as directed. Secondary Dressing: Zetuvit Plus 4x8 in 1 x Per Week Discharge Instructions: Apply over primary dressing as directed. Secondary Dressing: Drawtex 4x4 in 1 x Per Week Discharge Instructions: Apply over primary dressing as directed. Compression Wrap: FourPress (4 layer compression wrap) 1 x Per Week Discharge Instructions: Apply four layer compression as directed. May also use Miliken CoFlex 2 layer compression system as alternative. Wound #5 - Lower Leg Wound Laterality: Left, Posterior Peri-Wound Care: Zinc Oxide Ointment 30g tube 1 x Per Week Discharge Instructions: Apply Zinc Oxide to periwound with each dressing change Peri-Wound Care: Sween Lotion (Moisturizing lotion) 1 x Per Week Discharge Instructions: Apply moisturizing lotion as directed Prim Dressing: Promogran Prisma Matrix, 4.34 (sq in) (silver collagen) 1 x Per Week ary Discharge Instructions: Moisten collagen with saline or hydrogel Secondary Dressing: ABD Pad, 8x10 1 x Per  Week Discharge Instructions: Apply over primary dressing as directed. Secondary Dressing: Zetuvit Plus 4x8 in 1 x Per Week Discharge Instructions: Apply over primary dressing as directed. Secondary Dressing: Drawtex 4x4 in 1 x Per Week Discharge Instructions: Apply over primary dressing as directed. Compression Wrap: FourPress (4 layer compression wrap) 1 x Per Week Discharge Instructions: Apply four layer compression as directed. May also use Miliken CoFlex 2 layer compression system as alternative. Electronic Signature(s) Signed: 04/12/2021 4:03:07 PM By: [name] MD FACS Entered By: [name] on 04/12/2021 16:00:10 -------------------------------------------------------------------------------- Problem List Details Patient Name: Date of Service: Erin Gerold [name] 04/12/2021 2:45 PM Medical Record Number: [id number] Patient Account Number: [id number] Date of Birth/Sex: Treating RN: Nov 23, 1955 (66 y.o. F) Primary Care Provider: [name] Other Clinician: Referring Provider: Treating Provider/Extender: [name] in Treatment: 7 Active Problems ICD-10 Encounter Code Description Active Date MDM Diagnosis 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 (idiopathic) with ulcer and inflammation of left 02/21/2021 No Yes lower extremity L97.828 Non-pressure chronic ulcer of other part of left lower leg with other specified 02/21/2021 No Yes severity Inactive Problems ICD-10 Code Description Active Date Inactive Date L03.116 Cellulitis of left lower limb 03/08/2021 03/08/2021 Resolved Problems Electronic Signature(s) Signed: 04/12/2021 3:56:25 PM By: [name] MD FACS Entered By: [name] on 04/12/2021 15:56:25 -------------------------------------------------------------------------------- Progress Note Details Patient Name: Date of Service: Erin Face D. 04/12/2021 2:45 PM Medical Record Number: [id number] Patient Account Number:  [id number] Date of Birth/Sex: Treating RN: 1956/01/12 (66 y.o. F) Primary Care Provider: [name] Other Clinician: Referring Provider: Treating Provider/Extender: [name] in Treatment: 7 Subjective Chief Complaint Information obtained from Patient 04/12/2021: The patient is here for ongoing follow-up regarding 2 left lower extremity wounds. 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 She tells [name] that she fell on the gravel driveway in July. She developed injuries on her distal lower leg which have not healed. She saw her primary physician  on 11/15/2017 who noted her left shin injuries. Gave her antibiotics. At that point the wounds were almost circumferential however most were less than 1.5 cm. Weeping edema fluid was noted. She was referred here for evaluation. The patient has a history of chronic lower extremity edema. 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 She has had prior history with leg weeping fluid. She does not wear compression stockings. She is not doing anything specific to these wound areas. 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 She also has lymphedema. Put her in 3 layer compression and silver alginate last week. She has 3 small wounds with depth just lateral to the tibia. More problematically than this she has numerous shallow areas some of which are almost canal like in shape with tightly adherent painful debris. It would be very difficult and time- consuming to go  through this and attempt to individually debride all these areas.. I changed her to collagen today to see if that would help with any of the surface debris on some of these wounds. Otherwise we will not be able to put this in compression we have to have someone change the dressing. The patient is not eligible for home health 12/25/17 on evaluation today patient actually appears to be doing rather well in regard to the ulcer on her lower extremity. Fortunately there does not appear to be evidence of infection at this time. She has been tolerating the dressing changes without complication. This includes the compression wrap. The only issue she had was that the wrap was initially placed over her bunion region which actually calls her some discomfort and pain. Other than that things seem to be going rather well. 01/01/2018 Seen today for follow-up and management of left lower extremity wound and lymphedema. T oday she presents with a new wound towards to the left lateral LE. Recently treated with a 7 day course of amoxicillin; reason for antibiotic dose is unknown at this time. Tolerating current treatment of collagen with 4- layer wraps. She obtained a venous reflux study on 12/25/17. Studies show on the right abnormal reflux times of the popliteal vein, great saphenous vein at the saphenofemoral junction at the proximal thigh, great saphenous vein at the mid calf, and origin of the small saphenous vein.No superficial thrombosis. No deep vein thrombosis in the common femoral, femoral,and popliteal veins. Left abnormal reflex times as well of the common femoral vein, popliteal vein, and a great saphenous vein at the saphenofemoral junction, In great saphenous vein at the mid thigh w/o thrombosis. Has any issues or concerns during visit today. Recommended follow-up to vascular specialist due to abnormalities from the venous reflux study. Denies fever, pain, chills, dizziness, nausea, or vomiting. 01/08/18  upon evaluation today the patient actually seems to be showing some signs of improvement in my opinion at this point in regard to the lower extremity ulcerated areas. She still has a lot of drainage but fortunately nothing that appears to be too significant currently. I have been very happy with the overall progress I see today compared to where things were during the last evaluation that I had with her. Nonetheless she has not  had her appointment with the vein specialist as of yet in fact we were able to get this approved for her today and confirmed with them she will be seeing them on December 26. Nonetheless in general I do feel like the compression wraps is doing well for her. 01/17/18; quite a bit of improvement since last time I saw this patient she has a small open area remaining on the left lateral calf and even smaller area medially. She has lymphedema chronic stasis changes with distal skin fibrosis. She has an appointment with vascular surgery later this month 01/24/2018; the patient's medial leg has closed. Still a small open area on the left lateral leg. She states that the 4 layer compression we put on last week was too tight and she had to take it off over a few days ago. We have had resultant increase in her lymphedema in the dorsal foot and a proximal calf. Fortunately that does not seem to have resulted in any deterioration in her wounds The patient is going to need compression stockings. We have given her measurements to phone elastic therapy in [place]. She has vascular surgery consult on December 26 02/07/18; she's had continuous contraction on the left lateral leg wound which is now very small. Currently using silver alginate under 3 layer compression She saw Dr. Trula Slade of vascular surgery on 02/06/18. It was noted that she had a normal reflux times in the popliteal vein, great saphenous vein at the saphenofemoral junction, great saphenous vein at the proximal thigh great  saphenous vein at the mid calf and origin of the small saphenous vein. It was noted that she had significant reflux in the left saphenous veins with diameter measurements in the 0.7-0.8 cm range. It was felt she would benefit from laser ablation to help minimize the risk of ulcer recurrence. It was recommended that she wear 20-30 thigh-high compression stockings and have follow-up in 4-6 weeks 02/17/2018; I thought this lady would be healed however her compression slipped down and she developed increasing swelling and the wound is actually larger. 1/13; we had deterioration last week after the patient's compression slipped down and she developed periwound swelling. I increased her compression before layers we have been using silver alginate we are a lot better again today. She has her compression stockings in waiting 1/23; the patient's wounds are totally healed today. She has her stockings. This was almost circumferential skin damage. She follows up with Dr. Trula Slade of vascular surgery next Monday. READMISSION 02/21/2021 This is a now 66 year old woman that we had in clinic here discharging in January 2020 with wounds on her left calf chronic venous insufficiency. She was discharged with 30/40 stockings. It does not sound like she has worn stockings in about a year largely from not being able to get them on herself. In November she developed new blisters on her legs an area laterally is opened into a fairly sizable wound. She has weeping posteriorly as well. She has been using Neosporin and Band-Aids. The patient did see Dr. Trula Slade in 2019 and 2020. He felt she might benefit from laser ablation in her left saphenous veins although because of the wound that healed I do not think he went through with it. She might benefit from seeing him again. Her ABI on the left is 1. 1/17; patient's wound on the posterior left calf is closed she has the 2 large areas last week. We put her in 4-layer compression for  edema control is a lot better. Our  intake nurse noted greenish drainage and odor. We have been using silver alginate 1/25; PCR culture I did have the substantial wound area on the left lateral lower leg showed staff aureus and group A strep. Low titers of coag negative staph which are probably skin contaminants. Resistance detected to tetracycline methicillin and macrolides. I gave her a starter kit of Nuzyra 150 mg x 3 for 2 days then 300 mg for a further 7 days. Marked odor considerable increase in surrounding erythema. We are using Iodoflex last week however I have changed to silver alginate with underlying Bactroban 2/1; she is completing her [place] tomorrow. The degree of erythema around the wounds looks a lot better. Odor has improved. I gave her silver alginate and Bactroban last week and changing her back to Iodoflex to continue with ongoing debridement 2/8; the periwound looks a lot better. Surface of the wound also looks somewhat better although there is still ongoing debridement to be done we have been using Iodoflex under compression. Primary dressing and silver alginate 2/15; left posterior calf. Some improvement in the surface of the wound but still very gritty we have been using Iodoflex under compression. 04/05/2021: Left lateral posterior calf. She continues to have significant amounts of drainage, some of which is probably comprised of the Iodoflex microbleeds. There is no odor to the drainage. The satellite lesion on the more posterior aspect of the calf is epithelializing nicely and has contracted quite a bit. There is robust granulation tissue in the dominant wound with minimal adherent slough. 04/12/2021: The satellite lesion has nearly closed. She continues to have good granulation tissue with minimal slough of the larger primary wound. She continues to have a fair amount of drainage, but I think this is less secondary to switching her dressing from Iodoflex to Prisma. Patient  History Information obtained from Patient. Family History Heart Disease - Mother,Father, Hypertension - Mother,Father, Kidney Disease - Mother, Thyroid Problems - Mother, No family history of Cancer, Diabetes, Hereditary Spherocytosis, Lung Disease, Seizures, Stroke, Tuberculosis. Social History Never smoker, Marital Status - Single, Alcohol Use - Never, Drug Use - No History, Caffeine Use - Daily - coffee. Medical History Eyes Patient has history of Cataracts Hematologic/Lymphatic Patient has history of Lymphedema Cardiovascular Patient has history of Hypertension, Peripheral Venous Disease Integumentary (Skin) Denies history of History of Burn Musculoskeletal Patient has history of Osteoarthritis Hospitalization/Surgery History - colostomy reversal. 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 Medical A Surgical History Notes nd Constitutional Symptoms (General Health) morbid obesity Cardiovascular schamberg disease, hyperlipidemia Gastrointestinal diverticulitis , h/o obstruction due to diverticulitis , colostomy and colostomy reversal Endocrine hypothyroidism Objective Constitutional She is obese.. No acute distress. Vitals Time Taken: 2:43 PM, Height: 62 in, Weight: 226 lbs, BMI: 41.3, Temperature: 97.9 F, Pulse: 103 bpm, Respiratory Rate: 20 breaths/min, Blood Pressure: 124/74 mmHg. Respiratory Normal work of breathing on room air. Integumentary (Hair, Skin) Wound #3 status is Open. Original cause of wound was Blister. The date acquired was: 12/13/2020. The wound has been in treatment 7 weeks. The wound is located on the Left,Lateral Lower Leg. The wound measures 3.1cm length x 5cm width x 0.2cm depth; 12.174cm^2 area and 2.435cm^3 volume. There is Fat Layer (Subcutaneous Tissue) exposed. There is a large amount of serosanguineous drainage noted. The wound margin is flat and intact. There is large (67- 100%) pink granulation within the wound bed. There is a small (1-33%) amount  of necrotic tissue within the wound bed including Adherent Slough. Wound #5 status is Open. Original cause  of wound was Gradually Appeared. The date acquired was: 04/12/2021. The wound is located on the Left,Posterior Lower Leg. The wound measures 1.5cm length x 1.5cm width x 0.1cm depth; 1.767cm^2 area and 0.177cm^3 volume. There is Fat Layer (Subcutaneous Tissue) exposed. There is no tunneling or undermining noted. There is a medium amount of serosanguineous drainage noted. There is large (67-100%) pink granulation within the wound bed. There is no necrotic tissue within the wound bed. 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 There was a Excisional Skin/Subcutaneous Tissue Debridement with a total area of 15.5 sq cm performed by [name], MD. With the following instrument(s): Curette to remove Non-Viable tissue/material. Material removed includes Subcutaneous Tissue and Slough and after achieving pain control using Other (Benzocaine 20%). No specimens were taken. A time out was conducted at 15:23, prior to the start of the procedure. A Minimum amount of bleeding was controlled with Pressure. The procedure was tolerated well with a pain level of 1 throughout and a pain level of 1 following the procedure. Post Debridement Measurements: 3.1cm length x 5cm width x 0.2cm depth; 2.435cm^3 volume. Character of Wound/Ulcer Post Debridement is improved. Severity of Tissue Post Debridement is: Fat layer exposed. Post procedure Diagnosis Wound #3: Same as Pre-Procedure Pre-procedure diagnosis of Wound #3 is a Venous Leg Ulcer located on the Left,Lateral  Lower Leg . There was a Four Layer Compression Therapy Procedure by Sharyn Creamer, RN. Post procedure Diagnosis Wound #3: Same as Pre-Procedure Wound #5 Pre-procedure diagnosis of Wound #5 is a Venous Leg Ulcer located on the Left,Posterior Lower Leg .Severity of Tissue Pre Debridement is: Fat layer exposed. There was a Excisional Skin/Subcutaneous Tissue Debridement with a total area of 2.25 sq cm performed by [name], MD. With the following instrument(s): Curette to remove Non-Viable tissue/material. Material removed includes Subcutaneous Tissue and Slough and after achieving pain control using Other (Benzocaine 20%). No specimens were taken. A time out was conducted at 15:23, prior to the start of the procedure. A Minimum amount of bleeding was controlled with Pressure. The procedure was tolerated well with a pain level of 1 throughout and a pain level of 1 following the procedure. Post Debridement Measurements: 1.5cm length x 1.5cm width x 0.1cm depth; 0.177cm^3 volume. Character of Wound/Ulcer Post Debridement is improved. Severity of Tissue Post Debridement is: Fat layer exposed. Post procedure Diagnosis Wound #5: Same as Pre-Procedure Pre-procedure diagnosis of Wound #5 is a Venous Leg Ulcer located on the Left,Posterior Lower Leg . There was a Four Layer Compression Therapy Procedure by Sharyn Creamer, RN. Post procedure Diagnosis Wound #5: Same as Pre-Procedure Plan Follow-up Appointments: Return Appointment in 1 week. - Dr. Celine Ahr room 3 Bathing/ Shower/ Hygiene: May shower with protection but do not get wound dressing(s) wet. - Ok to use [employer], can purchase at [facility], [facility], or [employer] Edema Control - Lymphedema / SCD / Other: Elevate legs to the level of the heart or above for 30 minutes daily and/or when sitting, a frequency of: - throughout the day Avoid standing for long periods of time. Exercise regularly WOUND #3: - Lower Leg Wound Laterality: Left,  Lateral Peri-Wound Care: Zinc Oxide Ointment 30g tube 1 x Per Week/ Discharge  Instructions: Apply Zinc Oxide to periwound with each dressing change Peri-Wound Care: Sween Lotion (Moisturizing lotion) 1 x Per Week/ Discharge Instructions: Apply moisturizing lotion as directed Prim Dressing: Promogran Prisma Matrix, 4.34 (sq in) (silver collagen) 1 x Per Week/ ary Discharge Instructions: Moisten collagen with saline or hydrogel Secondary Dressing: ABD Pad, 8x10 1 x Per Week/ Discharge Instructions: Apply over primary dressing as directed. Secondary Dressing: Zetuvit Plus 4x8 in 1 x Per Week/ Discharge Instructions: Apply over primary dressing as directed. Secondary Dressing: Drawtex 4x4 in 1 x Per Week/ Discharge Instructions: Apply over primary dressing as directed. Com pression Wrap: FourPress (4 layer compression wrap) 1 x Per Week/ Discharge Instructions: Apply four layer compression as directed. May also use Miliken CoFlex 2 layer compression system as alternative. WOUND #5: - Lower Leg Wound Laterality: Left, Posterior Peri-Wound Care: Zinc Oxide Ointment 30g tube 1 x Per Week/ Discharge Instructions: Apply Zinc Oxide to periwound with each dressing change Peri-Wound Care: Sween Lotion (Moisturizing lotion) 1 x Per Week/ Discharge Instructions: Apply moisturizing lotion as directed Prim Dressing: Promogran Prisma Matrix, 4.34 (sq in) (silver collagen) 1 x Per Week/ ary Discharge Instructions: Moisten collagen with saline or hydrogel Secondary Dressing: ABD Pad, 8x10 1 x Per Week/ Discharge Instructions: Apply over primary dressing as directed. Secondary Dressing: Zetuvit Plus 4x8 in 1 x Per Week/ Discharge Instructions: Apply over primary dressing as directed. Secondary Dressing: Drawtex 4x4 in 1 x Per Week/ Discharge Instructions: Apply over primary dressing as directed. Com pression Wrap: FourPress (4 layer compression wrap) 1 x Per Week/ Discharge Instructions: Apply four layer  compression as directed. May also use Miliken CoFlex 2 layer compression system as alternative. 04/12/2021: Wound examoothe smaller satellite lesion on the left posterior calf has almost closed. There is minimal slough present. The larger wound continues to have good granulation tissue with some loose slough as well as some gritty adherent slough. Both wounds were debrided with a #3 curette. 04/12/2021: Continued improvement with both lesions. Continue Prisma under compression. I will see her in a week. Electronic Signature(s) Signed: 04/12/2021 4:02:27 PM By: [name] MD FACS Entered By: [name] on 04/12/2021 16:02:27 -------------------------------------------------------------------------------- HxROS Details Patient Name: Date of Service: Erin Gerold [name] 04/12/2021 2:45 PM Medical Record Number: [id number] Patient Account Number: [id number] Date of Birth/Sex: Treating RN: 08-May-1955 (66 y.o. F) Primary Care Provider: [name] Other Clinician: Referring Provider: Treating Provider/Extender: [name] in Treatment: 7 Information Obtained From Patient Constitutional Symptoms (General Health) Medical History: Past Medical History Notes: morbid obesity Eyes Medical History: Positive for: Cataracts Hematologic/Lymphatic Medical History: Positive for: Lymphedema Cardiovascular Medical History: Positive for: Hypertension; Peripheral Venous Disease Past Medical History Notes: schamberg disease, hyperlipidemia Gastrointestinal Medical History: Past Medical History Notes: diverticulitis , h/o obstruction due to diverticulitis , colostomy and colostomy reversal Endocrine Medical History: Past Medical History Notes: hypothyroidism Integumentary (Skin) Medical History: Negative for: History of Burn Musculoskeletal Medical History: Positive for: Osteoarthritis HBO Extended History  Items Eyes: Cataracts Immunizations Pneumococcal Vaccine: Received Pneumococcal Vaccination: Yes Received Pneumococcal Vaccination On or After 60th Birthday: Yes Implantable Devices None Hospitalization / Surgery History Type of Hospitalization/Surgery colostomy reversal 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 Family and Social History Cancer: No; Diabetes: No; Heart Disease: Yes - Mother,Father; Hereditary Spherocytosis: No; Hypertension: Yes - Mother,Father; Kidney Disease: Yes - Mother; Lung Disease: No; Seizures: No; Stroke: No; Thyroid Problems: Yes - Mother; Tuberculosis: No; Never smoker; Marital Status - Single; Alcohol Use: Never; Drug Use: No History; Caffeine Use:  Daily - coffee; Financial Concerns: No; Food, Clothing or Shelter Needs: No; Support System Lacking: No; Transportation Concerns: No Electronic Signature(s) Signed: 04/12/2021 4:03:07 PM By: [name] MD FACS Entered By: [name] on 04/12/2021 15:58:19 -------------------------------------------------------------------------------- [place] Details Patient Name: Date of Service: Erin Gerold [name] 04/12/2021 Medical Record Number: [id number] Patient Account Number: [id number] Date of Birth/Sex: Treating RN: Jan 16, 1956 (66 y.o. F) Primary Care Provider: [name] Other Clinician: Referring Provider: Treating Provider/Extender: [name] in Treatment: 7 Diagnosis Coding ICD-10 Codes Code Description [phone] Chronic venous hypertension (idiopathic) with ulcer and inflammation of left lower extremity L97.828 Non-pressure chronic ulcer of other part of left lower leg with other specified severity Facility Procedures CPT4 Code: [id number] Description: 38377 - DEB SUBQ TISSUE 20 SQ CM/< ICD-10 Diagnosis Description L97.828 Non-pressure chronic ulcer of other part of left lower leg with other specified Modifier: severity Quantity: 1 Physician Procedures : CPT4 Code  Description Modifier [id number] - WC PHYS SUBQ TISS 20 SQ CM ICD-10 Diagnosis Description L97.828 Non-pressure chronic ulcer of other part of left lower leg with other specified severity Quantity: 1 Electronic Signature(s) Signed: 04/12/2021 4:02:39 PM By: [name] MD FACS Entered By: [name] on 04/12/2021 16:02:39

## 2021-04-13 NOTE — Progress Notes (Signed)
[name], [name] ([id number]) Visit Report for 04/12/2021 Arrival Information Details Patient Name: Date of Service: [name] 04/12/2021 2:45 PM Medical Record Number: [id number] Patient Account Number: [id number] Date of Birth/Sex: Treating RN: 04-10-1955 (66 y.o. F) Primary Care [name] Other Clinician: Referring [name]: Treating [name]/Extender: [name] in Treatment: 7 Visit Information History Since Last Visit Added or deleted any medications: No Patient Arrived: Cane Any new allergies or adverse reactions: No Arrival Time: 14:42 Had a fall or experienced change in No Accompanied By: self activities of daily living that may affect Transfer Assistance: None risk of falls: Patient Identification Verified: Yes Signs or symptoms of abuse/neglect since last visito No Secondary Verification Process Completed: Yes Hospitalized since last visit: No Patient Requires Transmission-Based Precautions: No Implantable device outside of the clinic excluding No Patient Has Alerts: No cellular tissue based products placed in the center since last visit: Has Dressing in Place as Prescribed: Yes Pain Present Now: No Electronic Signature(s) Signed: 04/13/2021 9:21:53 AM By: Sandre Kitty Entered By: Sandre Kitty on 04/12/2021 14:43:31 -------------------------------------------------------------------------------- Compression Therapy Details Patient Name: Date of Service: Erin Face D. 04/12/2021 2:45 PM Medical Record Number: [id number] Patient Account Number: [id number] Date of Birth/Sex: Treating RN: [birth year] (66 y.o. [name] Primary Care Kristoph Sattler: [place] [name] Other Clinician: Referring [name]: Treating [name]/Extender: [name] in Treatment: 7 Compression Therapy Performed for Wound Assessment: Wound #3 Left,Lateral Lower Leg Performed By: Clinician Erin Creamer,  RN Compression Type: Four Layer Post Procedure Diagnosis Same as Pre-procedure Electronic Signature(s) Signed: 04/12/2021 4:14:19 PM By: [name] RN Entered By: [name] on 04/12/2021 15:36:13 -------------------------------------------------------------------------------- Compression Therapy Details Patient Name: Date of Service: Erin Face D. 04/12/2021 2:45 PM Medical Record Number: [id number] Patient Account Number: [id number] Date of Birth/Sex: Treating RN: 08-09-1955 (66 y.o. [name] Primary Care Mckynlee Luse: Glendale [name] Other Clinician: Referring [name]: Treating [name]/Extender: [name] in Treatment: 7 Compression Therapy Performed for Wound Assessment: Wound #5 Left,Posterior Lower Leg Performed By: Clinician Erin Creamer, RN Compression Type: Four Layer Post Procedure Diagnosis Same as Pre-procedure Electronic Signature(s) Signed: 04/12/2021 4:14:19 PM By: [name] RN Entered By: [name] on 04/12/2021 15:39:09 -------------------------------------------------------------------------------- Encounter Discharge Information Details Patient Name: Date of Service: Erin Face D. 04/12/2021 2:45 PM Medical Record Number: [id number] Patient Account Number: [id number] Date of Birth/Sex: Treating RN: 10-17-1955 (66 y.o. [name] Primary Care Camisha Srey: Glendale [name] Other Clinician: Referring [name]: Treating [name]/Extender: [name] in Treatment: 7 Encounter Discharge Information Items Post Procedure Vitals Discharge Condition: Stable Temperature (F): 97.9 Ambulatory Status: Cane Pulse (bpm): 103 Discharge Destination: Home Respiratory Rate (breaths/min): 20 Transportation: Private Auto Blood Pressure (mmHg): 124/74 Accompanied By: self Schedule Follow-up Appointment: Yes Clinical Summary of Care: Patient Declined Electronic  Signature(s) Signed: 04/12/2021 4:14:19 PM By: [name] RN Entered By: [name] on 04/12/2021 16:12:59 -------------------------------------------------------------------------------- Lower Extremity Assessment Details Patient Name: Date of Service: [name], [name] 04/12/2021 2:45 PM Medical Record Number: [id number] Patient Account Number: [id number] Date of Birth/Sex: Treating RN: 08-13-1955 (66 y.o. [name] Primary Care [name]: Glendale [name] Other Clinician: Referring [name]: Treating [name]/Extender: [name] in Treatment: 7 Edema Assessment Assessed: [Left: No] [Right: No] Edema: [Left: Ye] [Right: s] Calf Left: Right: Point of Measurement: 29 cm From Medial Instep 37.5 cm Ankle Left: Right: Point of Measurement: From Medial Instep 22.1 cm Electronic Signature(s) Signed: 04/12/2021 4:14:19 PM By:  [name] RN Entered By: [name] on 04/12/2021 15:03:02 -------------------------------------------------------------------------------- Multi Wound Chart Details Patient Name: Date of Service: [name], [name] 04/12/2021 2:45 PM Medical Record Number: [id number] Patient Account Number: [id number] Date of Birth/Sex: Treating RN: 03-05-1955 (66 y.o. F) Primary Care [name]: Glendale [name] Other Clinician: Referring Dakai [name]: Treating Kenai Fluegel/Extender: [name] in Treatment: 7 Vital Signs Height(in): 60 Pulse(bpm): 103 Weight(lbs): 226 Blood Pressure(mmHg): 124/74 Body Mass Index(BMI): 41.3 Temperature(F): 97.9 Respiratory Rate(breaths/min): 20 Photos: [N/A:N/A] Left, Lateral Lower Leg Left, Posterior Lower Leg N/A Wound Location: Blister Gradually Appeared N/A Wounding Event: Venous Leg Ulcer Venous Leg Ulcer N/A Primary Etiology: Cataracts, Lymphedema, Cataracts, Lymphedema, N/A Comorbid History: Hypertension, Peripheral Venous Hypertension, Peripheral  Venous Disease, Osteoarthritis Disease, Osteoarthritis 12/13/2020 04/12/2021 N/A Date Acquired: 7 0 N/A Weeks of Treatment: Open Open N/A Wound Status: No No N/A Wound Recurrence: 3.1x5x0.2 1.5x1.5x0.1 N/A Measurements L x W x D (cm) 12.174 1.767 N/A A (cm) : rea 2.435 0.177 N/A Volume (cm) : 58.10% 0.00% N/A % Reduction in Area: 58.10% 0.00% N/A % Reduction in Volume: Full Thickness Without Exposed Full Thickness Without Exposed N/A Classification: Support Structures Support Structures Large Medium N/A Exudate Amount: Serosanguineous Serosanguineous N/A Exudate Type: red, brown red, brown N/A Exudate Color: Flat and Intact N/A N/A Wound Margin: Large (67-100%) Large (67-100%) N/A Granulation Amount: Pink Pink N/A Granulation Quality: Small (1-33%) None Present (0%) N/A Necrotic Amount: Fat Layer (Subcutaneous Tissue): Yes Fat Layer (Subcutaneous Tissue): Yes N/A Exposed Structures: Fascia: No Fascia: No Tendon: No Tendon: No Muscle: No Muscle: No Joint: No Joint: No Bone: No Bone: No Small (1-33%) Small (1-33%) N/A Epithelialization: Debridement - Excisional Debridement - Excisional N/A Debridement: 15:23 15:23 N/A Pre-procedure Verification/Time Out Taken: Other Other N/A Pain Control: Subcutaneous, Slough Subcutaneous, Slough N/A Tissue Debrided: Skin/Subcutaneous Tissue Skin/Subcutaneous Tissue N/A Level: 15.5 2.25 N/A Debridement A (sq cm): rea Curette Curette N/A Instrument: Minimum Minimum N/A Bleeding: Pressure Pressure N/A Hemostasis A chieved: 1 1 N/A Procedural Pain: 1 1 N/A Post Procedural Pain: Procedure was tolerated well Procedure was tolerated well N/A Debridement Treatment Response: 3.1x5x0.2 1.5x1.5x0.1 N/A Post Debridement Measurements L x W x D (cm) 2.435 0.177 N/A Post Debridement Volume: (cm) Compression Therapy Compression Therapy N/A Procedures Performed: Debridement Debridement Treatment Notes Electronic  Signature(s) Signed: 04/12/2021 3:56:39 PM By: [name] MD FACS Entered By: [name] on 04/12/2021 15:56:38 -------------------------------------------------------------------------------- Multi-Disciplinary Care Plan Details Patient Name: Date of Service: Erin Face D. 04/12/2021 2:45 PM Medical Record Number: [id number] Patient Account Number: [id number] Date of Birth/Sex: Treating RN: 1955-06-01 (66 y.o. [name] Primary Care Aarin Sparkman: Glendale [name] Other Clinician: Referring Deloros Beretta: Treating Inioluwa Boulay/Extender: [name] in Treatment: 7 Multidisciplinary Care Plan reviewed with physician Active Inactive Abuse / Safety / Falls / Self Care Management Nursing Diagnoses: Potential for falls Potential for injury related to falls Goals: Patient will not experience any injury related to falls Date Initiated: 02/21/2021 Target Resolution Date: 04/21/2021 Goal Status: Active Patient/caregiver will verbalize/demonstrate measures taken to prevent injury and/or falls Date Initiated: 02/21/2021 Target Resolution Date: 04/21/2021 Goal Status: Active Interventions: Assess Activities of Daily Living upon admission and as needed Assess fall risk on admission and as needed Assess: immobility, friction, shearing, incontinence upon admission and as needed Assess impairment of mobility on admission and as needed per policy Assess personal safety and home safety (as indicated) on admission and as needed Assess self care needs on admission and as needed Provide education on fall  prevention Provide education on personal and home safety Notes: Venous Leg Ulcer Nursing Diagnoses: Actual venous Insuffiency (use after diagnosis is confirmed) Goals: Patient will maintain optimal edema control Date Initiated: 02/21/2021 Target Resolution Date: 04/21/2021 Goal Status: Active Patient/caregiver will verbalize understanding of disease process and  disease management Date Initiated: 02/21/2021 Date Inactivated: 03/29/2021 Target Resolution Date: 03/24/2021 Goal Status: Met Interventions: Assess peripheral edema status every visit. Compression as ordered Provide education on venous insufficiency Notes: Wound/Skin Impairment Nursing Diagnoses: Impaired tissue integrity Knowledge deficit related to ulceration/compromised skin integrity Goals: Patient/caregiver will verbalize understanding of skin care regimen Date Initiated: 02/21/2021 Target Resolution Date: 04/21/2021 Goal Status: Active Ulcer/skin breakdown will have a volume reduction of 30% by week 4 Date Initiated: 02/21/2021 Date Inactivated: 03/29/2021 Target Resolution Date: 03/24/2021 Goal Status: Unmet Unmet Reason: infection Ulcer/skin breakdown will have a volume reduction of 50% by week 8 Date Initiated: 03/29/2021 Target Resolution Date: 04/21/2021 Goal Status: Active Interventions: Assess patient/caregiver ability to obtain necessary supplies Assess patient/caregiver ability to perform ulcer/skin care regimen upon admission and as needed Assess ulceration(s) every visit Provide education on ulcer and skin care Notes: Electronic Signature(s) Signed: 04/12/2021 4:14:19 PM By: [name] RN Entered By: [name] on 04/12/2021 16:08:27 -------------------------------------------------------------------------------- Pain Assessment Details Patient Name: Date of Service: Erin Face D. 04/12/2021 2:45 PM Medical Record Number: [id number] Patient Account Number: [id number] Date of Birth/Sex: Treating RN: 1955-03-11 (66 y.o. F) Primary Care Katharin Schneider: Glendale [name] Other Clinician: Referring Tyshea Imel: Treating Marvelle Span/Extender: [name] in Treatment: 7 Active Problems Location of Pain Severity and Description of Pain Patient Has Paino No Site Locations Pain Management and Medication Current Pain Management: Electronic  Signature(s) Signed: 04/13/2021 9:21:53 AM By: Sandre Kitty Entered By: Sandre Kitty on 04/12/2021 14:45:06 -------------------------------------------------------------------------------- Patient/Caregiver Education Details Patient Name: Date of Service: [name] 3/1/2023andnbsp2:45 PM Medical Record Number: [id number] Patient Account Number: [id number] Date of Birth/Gender: Treating RN: February 21, 1955 (66 y.o. [name] Primary Care Physician: Glendale [name] Other Clinician: Referring Physician: Treating Physician/Extender: [name] in Treatment: 7 Education Assessment Education Provided To: Patient Education Topics Provided Wound/Skin Impairment: Methods: Explain/Verbal Responses: Return demonstration correctly Electronic Signature(s) Signed: 04/12/2021 4:14:19 PM By: [name] RN Entered By: [name] on 04/12/2021 16:08:57 -------------------------------------------------------------------------------- Wound Assessment Details Patient Name: Date of Service: Erin Face D. 04/12/2021 2:45 PM Medical Record Number: [id number] Patient Account Number: [id number] Date of Birth/Sex: Treating RN: 1955/11/29 (66 y.o. F) Primary Care Candee Hoon: Glendale [name] Other Clinician: Referring Danaja Lasota: Treating Britton Bera/Extender: [name] in Treatment: 7 Wound Status Wound Number: 3 Primary Venous Leg Ulcer Etiology: Wound Location: Left, Lateral Lower Leg Wound Open Wounding Event: Blister Status: Date Acquired: 12/13/2020 Comorbid Cataracts, Lymphedema, Hypertension, Peripheral Venous Weeks Of Treatment: 7 History: Disease, Osteoarthritis Clustered Wound: No Photos Wound Measurements Length: (cm) 3.1 Width: (cm) 5 Depth: (cm) 0.2 Area: (cm) 12.174 Volume: (cm) 2.435 % Reduction in Area: 58.1% % Reduction in Volume: 58.1% Epithelialization: Small (1-33%) Wound  Description Classification: Full Thickness Without Exposed Support Structures Wound Margin: Flat and Intact Exudate Amount: Large Exudate Type: Serosanguineous Exudate Color: red, brown Foul Odor After Cleansing: No Slough/Fibrino Yes Wound Bed Granulation Amount: Large (67-100%) Exposed Structure Granulation Quality: Pink Fascia Exposed: No Necrotic Amount: Small (1-33%) Fat Layer (Subcutaneous Tissue) Exposed: Yes Necrotic Quality: Adherent Slough Tendon Exposed: No Muscle Exposed: No Joint Exposed: No Bone Exposed: No Treatment Notes Wound #3 (Lower Leg) Wound Laterality: Left, Lateral Cleanser Peri-Wound Care  Zinc Oxide Ointment 30g tube Discharge Instruction: Apply Zinc Oxide to periwound with each dressing change Sween Lotion (Moisturizing lotion) Discharge Instruction: Apply moisturizing lotion as directed Topical Primary Dressing Promogran Prisma Matrix, 4.34 (sq in) (silver collagen) Discharge Instruction: Moisten collagen with saline or hydrogel Secondary Dressing ABD Pad, 8x10 Discharge Instruction: Apply over primary dressing as directed. Zetuvit Plus 4x8 in Discharge Instruction: Apply over primary dressing as directed. Drawtex 4x4 in Discharge Instruction: Apply over primary dressing as directed. Secured With Compression Wrap FourPress (4 layer compression wrap) Discharge Instruction: Apply four layer compression as directed. May also use Miliken CoFlex 2 layer compression system as alternative. Compression Stockings Add-Ons Electronic Signature(s) Signed: 04/13/2021 9:21:53 AM By: Sandre Kitty Entered By: Sandre Kitty on 04/12/2021 14:52:11 -------------------------------------------------------------------------------- Wound Assessment Details Patient Name: Date of Service: [name], [name]. 04/12/2021 2:45 PM Medical Record Number: [id number] Patient Account Number: [id number] Date of Birth/Sex: Treating RN: 04/17/1955 (66 y.o. [name] Primary Care Jahmad Petrich: [place]end[place] [name] Other Clinician: Referring Camala Talwar: Treating Katrinka Herbison/Extender: [name] in Treatment: 7 Wound Status Wound Number: 5 Primary Venous Leg Ulcer Etiology: Wound Location: Left, Posterior Lower Leg Wound Open Wounding Event: Gradually Appeared Status: Date Acquired: 04/12/2021 Comorbid Cataracts, Lymphedema, Hypertension, Peripheral Venous Weeks Of Treatment: 0 History: Disease, Osteoarthritis Clustered Wound: No Photos Wound Measurements Length: (cm) 1.5 Width: (cm) 1.5 Depth: (cm) 0.1 Area: (cm) 1.767 Volume: (cm) 0.177 % Reduction in Area: 0% % Reduction in Volume: 0% Epithelialization: Small (1-33%) Tunneling: No Undermining: No Wound Description Classification: Full Thickness Without Exposed Support Structures Exudate Amount: Medium Exudate Type: Serosanguineous Exudate Color: red, brown Foul Odor After Cleansing: No Slough/Fibrino No Wound Bed Granulation Amount: Large (67-100%) Exposed Structure Granulation Quality: Pink Fascia Exposed: No Necrotic Amount: None Present (0%) Fat Layer (Subcutaneous Tissue) Exposed: Yes Tendon Exposed: No Muscle Exposed: No Joint Exposed: No Bone Exposed: No Treatment Notes Wound #5 (Lower Leg) Wound Laterality: Left, Posterior Cleanser Peri-Wound Care Zinc Oxide Ointment 30g tube Discharge Instruction: Apply Zinc Oxide to periwound with each dressing change Sween Lotion (Moisturizing lotion) Discharge Instruction: Apply moisturizing lotion as directed Topical Primary Dressing Promogran Prisma Matrix, 4.34 (sq in) (silver collagen) Discharge Instruction: Moisten collagen with saline or hydrogel Secondary Dressing ABD Pad, 8x10 Discharge Instruction: Apply over primary dressing as directed. Zetuvit Plus 4x8 in Discharge Instruction: Apply over primary dressing as directed. Drawtex 4x4 in Discharge Instruction: Apply over primary dressing as  directed. Secured With Compression Wrap FourPress (4 layer compression wrap) Discharge Instruction: Apply four layer compression as directed. May also use Miliken CoFlex 2 layer compression system as alternative. Compression Stockings Add-Ons Electronic Signature(s) Signed: 04/12/2021 4:14:19 PM By: [name] RN Signed: 04/13/2021 9:21:53 AM By: Sandre Kitty Entered By: Sandre Kitty on 04/12/2021 15:12:32 -------------------------------------------------------------------------------- Vitals Details Patient Name: Date of Service: Erin Gerold [name] 04/12/2021 2:45 PM Medical Record Number: [id number] Patient Account Number: [id number] Date of Birth/Sex: Treating RN: [birth year] (66 y.o. F) Primary Care Kynzleigh Bandel: Glendale [name] Other Clinician: Referring Aigner Horseman: Treating Lajuan Godbee/Extender: [name] in Treatment: 7 Vital Signs Time Taken: 14:43 Temperature (F): 97.9 Height (in): 62 Pulse (bpm): 103 Weight (lbs): 226 Respiratory Rate (breaths/min): 20 Body Mass Index (BMI): 41.3 Blood Pressure (mmHg): 124/74 Reference Range: 80 - 120 mg / dl Electronic Signature(s) Signed: 04/13/2021 9:21:53 AM By: Sandre Kitty Entered By: Sandre Kitty on 04/12/2021 14:44:57

## 2021-04-19 ENCOUNTER — Other Ambulatory Visit: Payer: Self-pay

## 2021-04-19 ENCOUNTER — Encounter (HOSPITAL_BASED_OUTPATIENT_CLINIC_OR_DEPARTMENT_OTHER): Payer: 59 | Admitting: General Surgery

## 2021-04-19 DIAGNOSIS — I70248 Atherosclerosis of native arteries of left leg with ulceration of other part of lower left leg: Secondary | ICD-10-CM | POA: Diagnosis not present

## 2021-04-19 DIAGNOSIS — Z6841 Body Mass Index (BMI) 40.0 and over, adult: Secondary | ICD-10-CM | POA: Diagnosis not present

## 2021-04-19 DIAGNOSIS — L97828 Non-pressure chronic ulcer of other part of left lower leg with other specified severity: Secondary | ICD-10-CM | POA: Diagnosis not present

## 2021-04-19 DIAGNOSIS — L97222 Non-pressure chronic ulcer of left calf with fat layer exposed: Secondary | ICD-10-CM | POA: Diagnosis not present

## 2021-04-19 DIAGNOSIS — E669 Obesity, unspecified: Secondary | ICD-10-CM | POA: Diagnosis not present

## 2021-04-19 DIAGNOSIS — M1990 Unspecified osteoarthritis, unspecified site: Secondary | ICD-10-CM | POA: Diagnosis not present

## 2021-04-19 DIAGNOSIS — I872 Venous insufficiency (chronic) (peripheral): Secondary | ICD-10-CM | POA: Diagnosis not present

## 2021-04-19 DIAGNOSIS — L97822 Non-pressure chronic ulcer of other part of left lower leg with fat layer exposed: Secondary | ICD-10-CM | POA: Diagnosis not present

## 2021-04-19 DIAGNOSIS — I10 Essential (primary) hypertension: Secondary | ICD-10-CM | POA: Diagnosis not present

## 2021-04-19 NOTE — Progress Notes (Signed)
NARIYAH, OSIAS ([id number]) Visit Report for 04/19/2021 Chief Complaint Document Details Patient Name: Date of Service: BURMA, KETCHER 04/19/2021 3:45 PM Medical Record Number: [id number] Patient Account Number: [id number] Date of Birth/Sex: Treating RN: 1955-12-27 (66 y.o. F) Primary Care Provider: [name] Other Clinician: Referring Provider: Treating Provider/Extender: [name] in Treatment: 8 Information Obtained from: Patient Chief Complaint 04/19/2021: The patient is here for ongoing follow-up regarding 2 left lower extremity wounds. Electronic Signature(s) Signed: 04/19/2021 5:00:33 PM By: [name] MD FACS Entered By: [name] on 04/19/2021 17:00:33 -------------------------------------------------------------------------------- Debridement Details Patient Name: Date of Service: [name] 04/19/2021 3:45 PM Medical Record Number: [id number] Patient Account Number: [id number] Date of Birth/Sex: Treating RN: [birth year] (66 y.o. [name] Primary Care Provider: [name] Other Clinician: Referring Provider: Treating Provider/Extender: [name] in Treatment: 8 Debridement Performed for Assessment: Wound #3 Left,Lateral Lower Leg Performed By: Physician [name], MD Debridement Type: Debridement Severity of Tissue Pre Debridement: Fat layer exposed Level of Consciousness (Pre-procedure): Awake and Alert Pre-procedure Verification/Time Out Yes - 16:39 Taken: Start Time: 16:39 T Area Debrided (L x W): otal 3 (cm) x 3.2 (cm) = 9.6 (cm) Tissue and other material debrided: Non-Viable, Slough, Slough Level: Non-Viable Tissue Debridement Description: Selective/Open Wound Instrument: Curette Bleeding: Minimum Hemostasis Achieved: Pressure End Time: 16:41 Procedural Pain: 0 Post Procedural Pain: 0 Response to Treatment: Procedure was tolerated well Level of  Consciousness (Post- Awake and Alert procedure): Post Debridement Measurements of Total Wound Length: (cm) 3 Width: (cm) 3.2 Depth: (cm) 0.2 Volume: (cm) 1.508 Character of Wound/Ulcer Post Debridement: Improved Severity of Tissue Post Debridement: Fat layer exposed Post Procedure Diagnosis Same as Pre-procedure Electronic Signature(s) Signed: 04/19/2021 5:13:33 PM By: [name] MD FACS Signed: 04/19/2021 6:27:47 PM By: [name] RN, BSN Entered By: [name] on 04/19/2021 16:40:13 -------------------------------------------------------------------------------- HPI Details Patient Name: Date of Service: [name] 04/19/2021 3:45 PM Medical Record Number: [id number] Patient Account Number: [id number] Date of Birth/Sex: Treating RN: 1955/09/26 (66 y.o. 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 She tells [name] that she fell on the gravel driveway in July. She developed injuries on her distal lower leg which have not healed. She saw her primary physician on 11/15/2017 who noted her left shin injuries. Gave her antibiotics. At that point the wounds were almost circumferential however most were less than 1.5 cm. Weeping edema fluid was noted. She was referred here for evaluation. The patient has a history of chronic lower extremity edema. 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 She has had prior history with leg weeping fluid. She does not wear compression stockings. She is not doing anything specific to these wound areas. The patient has a history of obesity, arthritis, peripheral vascular disease  hypertension lower extremity edema and Schamberg's disease ABI in our clinic was 1.3 on the left 12/17/2017; patient readmitted to the clinic last week. 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 She also has lymphedema. Put her in 3 layer compression and silver alginate last week. She has 3 small wounds with depth just lateral to the tibia. More problematically than  this she has numerous shallow areas some of which are almost canal like in shape with tightly adherent painful debris. It would be very difficult and time- consuming to go through this and attempt to individually debride all these areas.. I changed her to collagen today to see if that would help with any of the surface debris on some of these wounds. Otherwise we will not be able to put this in compression we have to have someone change the dressing. The patient is not eligible for home health 12/25/17 on evaluation today patient actually appears to be doing rather well in regard to the ulcer on her lower extremity. Fortunately there does not appear to be evidence of infection at this time. She has been tolerating the dressing changes without complication. This includes the compression wrap. The only issue she had was that the wrap was initially placed over her bunion region which actually calls her some discomfort and pain. Other than that things seem to be going rather well. 01/01/2018 Seen today for follow-up and management of left lower extremity wound and lymphedema. T oday she presents with a new wound towards to the left lateral LE. Recently treated with a 7 day course of amoxicillin; reason for antibiotic dose is unknown at this time. Tolerating current treatment of collagen with 4- layer wraps. She obtained a venous reflux study on 12/25/17. Studies show on the right abnormal reflux times of the popliteal vein, great saphenous vein at the saphenofemoral junction at the proximal  thigh, great saphenous vein at the mid calf, and origin of the small saphenous vein.No superficial thrombosis. No deep vein thrombosis in the common femoral, femoral,and popliteal veins. Left abnormal reflex times as well of the common femoral vein, popliteal vein, and a great saphenous vein at the saphenofemoral junction, In great saphenous vein at the mid thigh w/o thrombosis. Has any issues or concerns during visit today. Recommended follow-up to vascular specialist due to abnormalities from the venous reflux study. Denies fever, pain, chills, dizziness, nausea, or vomiting. 01/08/18 upon evaluation today the patient actually seems to be showing some signs of improvement in my opinion at this point in regard to the lower extremity ulcerated areas. She still has a lot of drainage but fortunately nothing that appears to be too significant currently. I have been very happy with the overall progress I see today compared to where things were during the last evaluation that I had with her. Nonetheless she has not had her appointment with the vein specialist as of yet in fact we were able to get this approved for her today and confirmed with them she will be seeing them on December 26. Nonetheless in general I do feel like the compression wraps is doing well for her. 01/17/18; quite a bit of improvement since last time I saw this patient she has a small open area remaining on the left lateral calf and even smaller area medially. She has lymphedema chronic stasis changes with distal skin fibrosis. She has an appointment with vascular surgery later this month 01/24/2018; the patient's medial leg has closed. Still a small open area on the left lateral leg. She states that the 4 layer compression we put on last week was too tight and she had to take it off over a few days ago. We have had resultant increase in her lymphedema in the dorsal foot and a proximal calf. Fortunately that does not seem to have resulted  in any deterioration in her wounds The patient is  going to need compression stockings. We have given her measurements to phone elastic therapy in [place]. She has vascular surgery consult on December 26 02/07/18; she's had continuous contraction on the left lateral leg wound which is now very small. Currently using silver alginate under 3 layer compression She saw Dr. Trula Slade of vascular surgery on 02/06/18. It was noted that she had a normal reflux times in the popliteal vein, great saphenous vein at the saphenofemoral junction, great saphenous vein at the proximal thigh great saphenous vein at the mid calf and origin of the small saphenous vein. It was noted that she had significant reflux in the left saphenous veins with diameter measurements in the 0.7-0.8 cm range. It was felt she would benefit from laser ablation to help minimize the risk of ulcer recurrence. It was recommended that she wear 20-30 thigh-high compression stockings and have follow-up in 4-6 weeks 02/17/2018; I thought this lady would be healed however her compression slipped down and she developed increasing swelling and the wound is actually larger. 1/13; we had deterioration last week after the patient's compression slipped down and she developed periwound swelling. I increased her compression before layers we have been using silver alginate we are a lot better again today. She has her compression stockings in waiting 1/23; the patient's wounds are totally healed today. She has her stockings. This was almost circumferential skin damage. She follows up with Dr. Trula Slade of vascular surgery next Monday. READMISSION 02/21/2021 This is a now 66 year old woman that we had in clinic here discharging in January 2020 with wounds on her left calf chronic venous insufficiency. She was discharged with 30/40 stockings. It does not sound like she has worn stockings in about a year largely from not being able to get them on herself. In  November she developed new blisters on her legs an area laterally is opened into a fairly sizable wound. She has weeping posteriorly as well. She has been using Neosporin and Band-Aids. The patient did see Dr. Trula Slade in 2019 and 2020. He felt she might benefit from laser ablation in her left saphenous veins although because of the wound that healed I do not think he went through with it. She might benefit from seeing him again. Her ABI on the left is 1. 1/17; patient's wound on the posterior left calf is closed she has the 2 large areas last week. We put her in 4-layer compression for edema control is a lot better. Our intake nurse noted greenish drainage and odor. We have been using silver alginate 1/25; PCR culture I did have the substantial wound area on the left lateral lower leg showed staff aureus and group A strep. Low titers of coag negative staph which are probably skin contaminants. Resistance detected to tetracycline methicillin and macrolides. I gave her a starter kit of Nuzyra 150 mg x 3 for 2 days then 300 mg for a further 7 days. Marked odor considerable increase in surrounding erythema. We are using Iodoflex last week however I have changed to silver alginate with underlying Bactroban 2/1; she is completing her [place] tomorrow. The degree of erythema around the wounds looks a lot better. Odor has improved. I gave her silver alginate and Bactroban last week and changing her back to Iodoflex to continue with ongoing debridement 2/8; the periwound looks a lot better. Surface of the wound also looks somewhat better although there is still ongoing debridement to be done we have been using Iodoflex under compression. Primary dressing and silver alginate  2/15; left posterior calf. Some improvement in the surface of the wound but still very gritty we have been using Iodoflex under compression. 04/05/2021: Left lateral posterior calf. She continues to have significant amounts of drainage,  some of which is probably comprised of the Iodoflex microbleeds. There is no odor to the drainage. The satellite lesion on the more posterior aspect of the calf is epithelializing nicely and has contracted quite a bit. There is robust granulation tissue in the dominant wound with minimal adherent slough. 04/12/2021: The satellite lesion has nearly closed. She continues to have good granulation tissue with minimal slough of the larger primary wound. She continues to have a fair amount of drainage, but I think this is less secondary to switching her dressing from Iodoflex to Prisma. 04/19/2021: The satellite lesion is almost completely epithelialized. The larger primary wound continues to contract with good granulation tissue and minimal slough. She is currently in [place] with compression. Electronic Signature(s) Signed: 04/19/2021 5:01:21 PM By: [name] MD FACS Entered By: [name] on 04/19/2021 17:01:20 -------------------------------------------------------------------------------- Physical Exam Details Patient Name: Date of Service: [name] 04/19/2021 3:45 PM Medical Record Number: [id number] Patient Account Number: [id number] Date of Birth/Sex: Treating RN: January 24, 1956 (66 y.o. F) Primary Care Provider: [name] Other Clinician: Referring Provider: Treating Provider/Extender: [name] in Treatment: 8 Constitutional She is hypertensive, but asymptomatic.. . . . No acute distress. Respiratory Normal work of breathing on room air. Notes 04/19/2021: The small posterior satellite lesion is almost completely epithelialized with just a tiny open area at about the 11 o'clock position. The larger primary wound has contracted somewhat and has a good healthy base of granulation tissue. Very minimal slough is present. Easily debrided with a curette. Electronic Signature(s) Signed: 04/19/2021 5:03:12 PM By: [name] MD FACS Entered By:  [name] on 04/19/2021 17:03:12 -------------------------------------------------------------------------------- Physician Orders Details Patient Name: Date of Service: [name] 04/19/2021 3:45 PM Medical Record Number: [id number] Patient Account Number: [id number] Date of Birth/Sex: Treating RN: 1955/12/08 (66 y.o. [name] Primary Care Provider: [name] Other Clinician: Referring Provider: Treating Provider/Extender: [name] in Treatment: 8 Verbal / Phone Orders: No Diagnosis Coding ICD-10 Coding Code Description [phone] Chronic venous hypertension (idiopathic) with ulcer and inflammation of left lower extremity L97.828 Non-pressure chronic ulcer of other part of left lower leg with other specified severity Follow-up Appointments ppointment in 1 week. - Dr. Celine Ahr - Room 2 Return A Bathing/ Shower/ Hygiene May shower with protection but do not get wound dressing(s) wet. - Ok to use [employer], can purchase at [facility], [facility], or [employer] Edema Control - Lymphedema / SCD / Other Elevate legs to the level of the heart or above for 30 minutes daily and/or when sitting, a frequency of: - throughout the day Avoid standing for long periods of time. Exercise regularly Wound Treatment Wound #3 - Lower Leg Wound Laterality: Left, Lateral Peri-Wound Care: Zinc Oxide Ointment 30g tube 1 x Per Week Discharge Instructions: Apply Zinc Oxide to periwound with each dressing change Peri-Wound Care: Sween Lotion (Moisturizing lotion) 1 x Per Week Discharge Instructions: Apply moisturizing lotion as directed Prim Dressing: Promogran Prisma Matrix, 4.34 (sq in) (silver collagen) 1 x Per Week ary Discharge Instructions: Moisten collagen with saline or hydrogel Secondary Dressing: ABD Pad, 8x10 1 x Per Week Discharge Instructions: Apply over primary dressing as directed. Secondary Dressing: Zetuvit Plus 4x8 in 1 x Per Week Discharge  Instructions: Apply over  primary dressing as directed. Secondary Dressing: Drawtex 4x4 in 1 x Per Week Discharge Instructions: Apply over primary dressing as directed. Compression Wrap: FourPress (4 layer compression wrap) 1 x Per Week Discharge Instructions: Apply four layer compression as directed. May also use Miliken CoFlex 2 layer compression system as alternative. Wound #5 - Lower Leg Wound Laterality: Left, Posterior Peri-Wound Care: Zinc Oxide Ointment 30g tube 1 x Per Week Discharge Instructions: Apply Zinc Oxide to periwound with each dressing change Peri-Wound Care: Sween Lotion (Moisturizing lotion) 1 x Per Week Discharge Instructions: Apply moisturizing lotion as directed Prim Dressing: Promogran Prisma Matrix, 4.34 (sq in) (silver collagen) 1 x Per Week ary Discharge Instructions: Moisten collagen with saline or hydrogel Secondary Dressing: ABD Pad, 8x10 1 x Per Week Discharge Instructions: Apply over primary dressing as directed. Secondary Dressing: Zetuvit Plus 4x8 in 1 x Per Week Discharge Instructions: Apply over primary dressing as directed. Secondary Dressing: Drawtex 4x4 in 1 x Per Week Discharge Instructions: Apply over primary dressing as directed. Compression Wrap: FourPress (4 layer compression wrap) 1 x Per Week Discharge Instructions: Apply four layer compression as directed. May also use Miliken CoFlex 2 layer compression system as alternative. Electronic Signature(s) Signed: 04/19/2021 5:03:32 PM By: [name] MD FACS Entered By: [name] on 04/19/2021 17:03:31 -------------------------------------------------------------------------------- Problem List Details Patient Name: Date of Service: [name] 04/19/2021 3:45 PM Medical Record Number: [id number] Patient Account Number: [id number] Date of Birth/Sex: Treating RN: 1955-06-25 (66 y.o. [name] Primary Care Provider: [name] Other Clinician: Referring  Provider: Treating Provider/Extender: [name] in Treatment: 8 Active Problems ICD-10 Encounter Code Description Active Date MDM Diagnosis 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 (idiopathic) with ulcer and inflammation of left 02/21/2021 No Yes lower extremity L97.828 Non-pressure chronic ulcer of other part of left lower leg with other specified 02/21/2021 No Yes severity Inactive Problems ICD-10 Code Description Active Date Inactive Date L03.116 Cellulitis of left lower limb 03/08/2021 03/08/2021 Resolved Problems Electronic Signature(s) Signed: 04/19/2021 5:00:07 PM By: [name] MD FACS Entered By: [name] on 04/19/2021 17:00:07 -------------------------------------------------------------------------------- Progress Note Details Patient Name: Date of Service: [name] 04/19/2021 3:45 PM Medical Record Number: [id number] Patient Account Number: [id number] Date of Birth/Sex: Treating RN: [birth year] (66 y.o. F) Primary Care Provider: [name] Other Clinician: Referring Provider: Treating Provider/Extender: [name] in Treatment: 8 Subjective Chief Complaint Information obtained from Patient 04/19/2021: The patient is here for ongoing follow-up regarding 2 left lower extremity wounds. 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 She tells [name] that she fell on the gravel driveway in July. She developed injuries on her distal lower leg which have not healed. She saw her primary physician on 11/15/2017 who noted her left shin injuries. Gave her antibiotics. At that point the wounds were almost circumferential however most were less than 1.5 cm. Weeping edema fluid was noted. She was referred here for evaluation. The patient has a history of chronic lower extremity edema. 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 She has had prior history with leg weeping fluid. She does not wear compression stockings. She is not doing anything specific to these wound areas. The patient has a history of obesity, arthritis, peripheral vascular disease hypertension lower extremity edema and Schamberg's disease ABI in our clinic  was 1.3 on the left 12/17/2017; patient readmitted to the clinic last week. 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 She also has lymphedema. Put her in 3 layer compression and silver alginate last week. She has 3 small wounds with depth just lateral to the tibia. More problematically than this she has numerous shallow areas some of which are almost canal like in shape with tightly adherent painful debris. It would be very difficult and time- consuming to go through this and attempt to individually debride all these areas.. I changed her to collagen today to see if that would help with any of the surface debris on some of these wounds. Otherwise we will not be able to put this in compression we have to have someone change the dressing. The patient is not eligible for home health 12/25/17 on evaluation today patient actually appears to be doing rather well in regard to the ulcer on her lower extremity. Fortunately there does not appear to be evidence of infection at this time. She has been tolerating the dressing changes without complication. This includes the compression wrap. The only issue she had was that the wrap was initially placed over her bunion region which actually calls her some discomfort and pain. Other than that things seem to be going rather well. 01/01/2018 Seen today for follow-up and management of left lower extremity wound and lymphedema. T oday she presents with a new wound towards to the left lateral LE. Recently treated with  a 7 day course of amoxicillin; reason for antibiotic dose is unknown at this time. Tolerating current treatment of collagen with 4- layer wraps. She obtained a venous reflux study on 12/25/17. Studies show on the right abnormal reflux times of the popliteal vein, great saphenous vein at the saphenofemoral junction at the proximal thigh, great saphenous vein at the mid calf, and origin of the small saphenous vein.No superficial thrombosis. No deep vein thrombosis in the common femoral, femoral,and popliteal veins. Left abnormal reflex times as well of the common femoral vein, popliteal vein, and a great saphenous vein at the saphenofemoral junction, In great saphenous vein at the mid thigh w/o thrombosis. Has any issues or concerns during visit today. Recommended follow-up to vascular specialist due to abnormalities from the venous reflux study. Denies fever, pain, chills, dizziness, nausea, or vomiting. 01/08/18 upon evaluation today the patient actually seems to be showing some signs of improvement in my opinion at this point in regard to the lower extremity ulcerated areas. She still has a lot of drainage but fortunately nothing that appears to be too significant currently. I have been very happy with the overall progress I see today compared to where things were during the last evaluation that I had with her. Nonetheless she has not had her appointment with the vein specialist as of yet in fact we were able to get this approved for her today and confirmed with them she will be seeing them on December 26. Nonetheless in general I do feel like the compression wraps is doing well for her. 01/17/18; quite a bit of improvement since last time I saw this patient she has a small open area remaining on the left lateral calf and even smaller area medially. She has lymphedema chronic stasis changes with distal skin fibrosis. She has an appointment with vascular surgery later this month 01/24/2018; the  patient's medial leg has closed. Still a small open area on the left lateral leg. She states that  the 4 layer compression we put on last week was too tight and she had to take it off over a few days ago. We have had resultant increase in her lymphedema in the dorsal foot and a proximal calf. Fortunately that does not seem to have resulted in any deterioration in her wounds The patient is going to need compression stockings. We have given her measurements to phone elastic therapy in [place]. She has vascular surgery consult on December 26 02/07/18; she's had continuous contraction on the left lateral leg wound which is now very small. Currently using silver alginate under 3 layer compression She saw Dr. Trula Slade of vascular surgery on 02/06/18. It was noted that she had a normal reflux times in the popliteal vein, great saphenous vein at the saphenofemoral junction, great saphenous vein at the proximal thigh great saphenous vein at the mid calf and origin of the small saphenous vein. It was noted that she had significant reflux in the left saphenous veins with diameter measurements in the 0.7-0.8 cm range. It was felt she would benefit from laser ablation to help minimize the risk of ulcer recurrence. It was recommended that she wear 20-30 thigh-high compression stockings and have follow-up in 4-6 weeks 02/17/2018; I thought this lady would be healed however her compression slipped down and she developed increasing swelling and the wound is actually larger. 1/13; we had deterioration last week after the patient's compression slipped down and she developed periwound swelling. I increased her compression before layers we have been using silver alginate we are a lot better again today. She has her compression stockings in waiting 1/23; the patient's wounds are totally healed today. She has her stockings. This was almost circumferential skin damage. She follows up with Dr. Trula Slade of vascular surgery next  Monday. READMISSION 02/21/2021 This is a now 66 year old woman that we had in clinic here discharging in January 2020 with wounds on her left calf chronic venous insufficiency. She was discharged with 30/40 stockings. It does not sound like she has worn stockings in about a year largely from not being able to get them on herself. In November she developed new blisters on her legs an area laterally is opened into a fairly sizable wound. She has weeping posteriorly as well. She has been using Neosporin and Band-Aids. The patient did see Dr. Trula Slade in 2019 and 2020. He felt she might benefit from laser ablation in her left saphenous veins although because of the wound that healed I do not think he went through with it. She might benefit from seeing him again. Her ABI on the left is 1. 1/17; patient's wound on the posterior left calf is closed she has the 2 large areas last week. We put her in 4-layer compression for edema control is a lot better. Our intake nurse noted greenish drainage and odor. We have been using silver alginate 1/25; PCR culture I did have the substantial wound area on the left lateral lower leg showed staff aureus and group A strep. Low titers of coag negative staph which are probably skin contaminants. Resistance detected to tetracycline methicillin and macrolides. I gave her a starter kit of Nuzyra 150 mg x 3 for 2 days then 300 mg for a further 7 days. Marked odor considerable increase in surrounding erythema. We are using Iodoflex last week however I have changed to silver alginate with underlying Bactroban 2/1; she is completing her [place] tomorrow. The degree of erythema around the wounds looks a lot better. Odor has  improved. I gave her silver alginate and Bactroban last week and changing her back to Iodoflex to continue with ongoing debridement 2/8; the periwound looks a lot better. Surface of the wound also looks somewhat better although there is still ongoing debridement  to be done we have been using Iodoflex under compression. Primary dressing and silver alginate 2/15; left posterior calf. Some improvement in the surface of the wound but still very gritty we have been using Iodoflex under compression. 04/05/2021: Left lateral posterior calf. She continues to have significant amounts of drainage, some of which is probably comprised of the Iodoflex microbleeds. There is no odor to the drainage. The satellite lesion on the more posterior aspect of the calf is epithelializing nicely and has contracted quite a bit. There is robust granulation tissue in the dominant wound with minimal adherent slough. 04/12/2021: The satellite lesion has nearly closed. She continues to have good granulation tissue with minimal slough of the larger primary wound. She continues to have a fair amount of drainage, but I think this is less secondary to switching her dressing from Iodoflex to Prisma. 04/19/2021: The satellite lesion is almost completely epithelialized. The larger primary wound continues to contract with good granulation tissue and minimal slough. She is currently in [place] with compression. Patient History Information obtained from Patient. Family History Heart Disease - Mother,Father, Hypertension - Mother,Father, Kidney Disease - Mother, Thyroid Problems - Mother, No family history of Cancer, Diabetes, Hereditary Spherocytosis, Lung Disease, Seizures, Stroke, Tuberculosis. Social History Never smoker, Marital Status - Single, Alcohol Use - Never, Drug Use - No History, Caffeine Use - Daily - coffee. Medical History Eyes Patient has history of Cataracts Hematologic/Lymphatic Patient has history of Lymphedema Cardiovascular Patient has history of Hypertension, Peripheral Venous Disease Integumentary (Skin) Denies history of History of Burn Musculoskeletal Patient has history of Osteoarthritis Hospitalization/Surgery History - colostomy reversal. - colostomy due to  diverticulitis. Medical A Surgical History Notes nd Constitutional Symptoms (General Health) morbid obesity Cardiovascular schamberg disease, hyperlipidemia Gastrointestinal diverticulitis , h/o obstruction due to diverticulitis , colostomy and colostomy reversal Endocrine hypothyroidism Objective Constitutional She is hypertensive, but asymptomatic.Marland Kitchen No acute distress. Vitals Time Taken: 3:54 PM, Height: 62 in, Weight: 226 lbs, BMI: 41.3, Temperature: 97.6 F, Pulse: 73 bpm, Respiratory Rate: 20 breaths/min, Blood Pressure: 160/73 mmHg. Respiratory Normal work of breathing on room air. General Notes: 04/19/2021: The small posterior satellite lesion is almost completely epithelialized with just a tiny open area at about the 11 o'clock position. The larger primary wound has contracted somewhat and has a good healthy base of granulation tissue. Very minimal slough is present. Easily debrided with a curette. Integumentary (Hair, Skin) Wound #3 status is Open. Original cause of wound was Blister. The date acquired was: 12/13/2020. The wound has been in treatment 8 weeks. The wound is located on the Left,Lateral Lower Leg. The wound measures 3cm length x 3.2cm width x 0.2cm depth; 7.54cm^2 area and 1.508cm^3 volume. There is Fat Layer (Subcutaneous Tissue) exposed. There is no tunneling or undermining noted. There is a large amount of serosanguineous drainage noted. The wound margin is flat and intact. There is medium (34-66%) pink granulation within the wound bed. There is a medium (34-66%) amount of necrotic tissue within the wound bed including Adherent Slough. Wound #5 status is Open. Original cause of wound was Gradually Appeared. The date acquired was: 04/12/2021. The wound has been in treatment 1 weeks. The wound is located on the Left,Posterior Lower Leg. The wound measures 0.1cm length x  0.1cm width x 0.1cm depth; 0.008cm^2 area and 0.001cm^3 volume. There is Fat Layer (Subcutaneous  Tissue) exposed. There is no tunneling or undermining noted. There is a small amount of serosanguineous drainage noted. The wound margin is flat and intact. There is large (67-100%) pink granulation within the wound bed. There is no necrotic tissue within the wound bed. 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 There was a Selective/Open Wound Non-Viable Tissue Debridement with a total area of 9.6 sq cm performed by [name], MD. With the following instrument(s): Curette to remove Non-Viable tissue/material. Material removed includes [facility]. No specimens were taken. A time out was conducted at 16:39, prior to the start of the procedure. A Minimum amount of bleeding was controlled with Pressure. The procedure was tolerated well with a pain level of 0 throughout and a pain level of 0 following the procedure. Post Debridement Measurements: 3cm length x 3.2cm width x 0.2cm depth; 1.508cm^3 volume. Character of Wound/Ulcer Post Debridement is improved. Severity of Tissue Post Debridement is: Fat layer exposed. Post procedure Diagnosis Wound #3: Same as Pre-Procedure Pre-procedure diagnosis of Wound #3 is a Venous Leg Ulcer located on the Left,Lateral Lower Leg . There was a Four Layer Compression Therapy Procedure by [name], RN. Post procedure Diagnosis Wound #3: Same as Pre-Procedure Wound #5 Pre-procedure diagnosis of Wound #5 is a Venous Leg Ulcer located on the Left,Posterior Lower Leg . There was a Four Layer Compression Therapy Procedure by [name], RN. Post procedure Diagnosis Wound #5: Same as Pre-Procedure Plan Follow-up  Appointments: Return Appointment in 1 week. - Dr. Celine Ahr - Room 2 Bathing/ Shower/ Hygiene: May shower with protection but do not get wound dressing(s) wet. - Ok to use [employer], can purchase at [facility], [facility], or [employer] Edema Control - Lymphedema / SCD / Other: Elevate legs to the level of the heart or above for 30 minutes daily and/or when sitting, a frequency of: - throughout the day Avoid standing for long periods of time. Exercise regularly WOUND #3: - Lower Leg Wound Laterality: Left, Lateral Peri-Wound Care: Zinc Oxide Ointment 30g tube 1 x Per Week/ Discharge Instructions: Apply Zinc Oxide to periwound with each dressing change Peri-Wound Care: Sween Lotion (Moisturizing lotion) 1 x Per Week/ Discharge Instructions: Apply moisturizing lotion as directed Prim Dressing: Promogran Prisma Matrix, 4.34 (sq in) (silver collagen) 1 x Per Week/ ary Discharge Instructions: Moisten collagen with saline or hydrogel Secondary Dressing: ABD Pad, 8x10 1 x Per Week/ Discharge Instructions: Apply over primary dressing as directed. Secondary Dressing: Zetuvit Plus 4x8 in 1 x Per Week/ Discharge Instructions: Apply over primary dressing as directed. Secondary Dressing: Drawtex 4x4 in 1 x Per Week/ Discharge Instructions: Apply over primary dressing as directed. Com pression Wrap: FourPress (4 layer compression wrap) 1 x Per Week/ Discharge Instructions: Apply four layer compression as directed. May also use Miliken CoFlex 2 layer compression system as alternative. WOUND #5: - Lower Leg Wound Laterality: Left, Posterior Peri-Wound Care: Zinc Oxide Ointment 30g tube 1 x Per Week/ Discharge Instructions: Apply Zinc Oxide to periwound with each dressing change Peri-Wound Care: Sween Lotion (Moisturizing lotion) 1 x Per Week/ Discharge Instructions: Apply moisturizing lotion as directed Prim Dressing:  Promogran Prisma Matrix, 4.34 (sq in) (silver collagen) 1 x Per Week/ ary Discharge  Instructions: Moisten collagen with saline or hydrogel Secondary Dressing: ABD Pad, 8x10 1 x Per Week/ Discharge Instructions: Apply over primary dressing as directed. Secondary Dressing: Zetuvit Plus 4x8 in 1 x Per Week/ Discharge Instructions: Apply over primary dressing as directed. Secondary Dressing: Drawtex 4x4 in 1 x Per Week/ Discharge Instructions: Apply over primary dressing as directed. Com pression Wrap: FourPress (4 layer compression wrap) 1 x Per Week/ Discharge Instructions: Apply four layer compression as directed. May also use Miliken CoFlex 2 layer compression system as alternative. 04/19/2021: The satellite wound is nearly closed with epithelium everywhere except 1 small opening at about 11:00. This is clean and does not require debridement today. The larger primary wound has contracted and is smaller. Minimal adherent slough debrided today. The base is showing good healthy granulation tissue. Continue Prisma under 4-layer compression. Follow-up in 1 week. Electronic Signature(s) Signed: 04/19/2021 5:05:29 PM By: [name] MD FACS Entered By: [name] on 04/19/2021 17:05:29 -------------------------------------------------------------------------------- HxROS Details Patient Name: Date of Service: [name] 04/19/2021 3:45 PM Medical Record Number: [id number] Patient Account Number: [id number] Date of Birth/Sex: Treating RN: February 26, 1955 (66 y.o. F) Primary Care Provider: [name] Other Clinician: Referring Provider: Treating Provider/Extender: [name] in Treatment: 8 Information Obtained From Patient Constitutional Symptoms (General Health) Medical History: Past Medical History Notes: morbid obesity Eyes Medical History: Positive for: Cataracts Hematologic/Lymphatic Medical History: Positive for: Lymphedema Cardiovascular Medical History: Positive for: Hypertension; Peripheral Venous Disease Past  Medical History Notes: schamberg disease, hyperlipidemia Gastrointestinal Medical History: Past Medical History Notes: diverticulitis , h/o obstruction due to diverticulitis , colostomy and colostomy reversal Endocrine Medical History: Past Medical History Notes: hypothyroidism Integumentary (Skin) Medical History: Negative for: History of Burn Musculoskeletal Medical History: Positive for: Osteoarthritis HBO Extended History Items Eyes: Cataracts Immunizations Pneumococcal Vaccine: Received Pneumococcal Vaccination: Yes Received Pneumococcal Vaccination On or After 60th Birthday: Yes Implantable Devices None Hospitalization / Surgery History Type of Hospitalization/Surgery colostomy reversal 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 Family and Social History Cancer: No; Diabetes: No; Heart Disease: Yes - Mother,Father; Hereditary Spherocytosis: No; Hypertension: Yes - Mother,Father; Kidney Disease: Yes - Mother; Lung Disease: No; Seizures: No; Stroke: No; Thyroid Problems: Yes - Mother; Tuberculosis: No; Never smoker; Marital Status - Single; Alcohol Use: Never; Drug Use: No History; Caffeine Use: Daily - coffee; Financial Concerns: No; Food, Clothing or Shelter Needs: No; Support System Lacking: No; Transportation Concerns: No Physician Affirmation I have reviewed and agree with the above information. Electronic Signature(s) Signed: 04/19/2021 5:13:33 PM By: [name] MD FACS Entered By: [name] on 04/19/2021 17:01:32 -------------------------------------------------------------------------------- SuperBill Details Patient Name: Date of Service: [name] 04/19/2021 Medical Record Number: [id number] Patient Account Number: [id number] Date of Birth/Sex: Treating RN: 1955/02/23 (66 y.o. F) Primary Care Provider: [name] Other Clinician: Referring Provider: Treating Provider/Extender: [name] in Treatment: 8 Diagnosis  Coding ICD-10 Codes Code Description [id number] Chronic venous hypertension (idiopathic) with ulcer and inflammation of left lower extremity L97.828 Non-pressure chronic ulcer of other part of left lower leg with other specified severity Facility Procedures CPT4 Code: [id number] Description: [phone] - DEBRIDE WOUND 1ST 20 SQ CM OR < ICD-10 Diagnosis Description L97.828 Non-pressure chronic ulcer of other part of left lower leg with other specified se Modifier: verity Quantity: 1 Physician Procedures : CPT4 Code Description Modifier [id number] - WC PHYS DEBR WO ANESTH 20 SQ CM ICD-10 Diagnosis  Description L97.828 Non-pressure chronic ulcer of other part of left lower leg with other specified severity Quantity: 1 Electronic Signature(s) Signed: 04/19/2021 5:05:39 PM By: [name] MD FACS Entered By: [name] on 04/19/2021 17:05:39

## 2021-04-20 NOTE — Progress Notes (Signed)
[name], [name] ([id number]) Visit Report for 04/19/2021 Arrival Information Details Patient Name: Date of Service: [name], [name] 04/19/2021 3:45 PM Medical Record Number: [id number] Patient Account Number: [id number] Date of Birth/Sex: Treating RN: [birth year] (66 y.o. F) Primary Care Sneijder Bernards: [name] Other Clinician: Referring Zarian Colpitts: Treating Justinian Miano/Extender: [name] in Treatment: 8 Visit Information History Since Last Visit Added or deleted any medications: No Patient Arrived: Cane Any new allergies or adverse reactions: No Arrival Time: 15:53 Had a fall or experienced change in No Accompanied By: self activities of daily living that may affect Transfer Assistance: None risk of falls: Patient Identification Verified: Yes Signs or symptoms of abuse/neglect since last visito No Secondary Verification Process Completed: Yes Hospitalized since last visit: No Patient Requires Transmission-Based Precautions: No Implantable device outside of the clinic excluding No Patient Has Alerts: No cellular tissue based products placed in the center since last visit: Has Dressing in Place as Prescribed: Yes Pain Present Now: No Electronic Signature(s) Signed: 04/20/2021 8:29:09 AM By: Sandre Kitty Entered By: Sandre Kitty on 04/19/2021 15:54:27 -------------------------------------------------------------------------------- Compression Therapy Details Patient Name: Date of Service: Erin Face D. 04/19/2021 3:45 PM Medical Record Number: [id number] Patient Account Number: [id number] Date of Birth/Sex: Treating RN: [birth year] (66 y.o. [name] Primary Care Leita Lindbloom: [name] Other Clinician: Referring [name] [name]: Treating Andreanna Mikolajczak/Extender: [name] in Treatment: 8 Compression Therapy Performed for Wound Assessment: Wound #3 Left,Lateral Lower Leg Performed By: Clinician [name],  RN Compression Type: Four Layer Post Procedure Diagnosis Same as Pre-procedure Electronic Signature(s) Signed: 04/19/2021 6:27:47 PM By: [name] RN, BSN Entered By: [name] on 04/19/2021 16:40:52 -------------------------------------------------------------------------------- Compression Therapy Details Patient Name: Date of Service: Erin Face D. 04/19/2021 3:45 PM Medical Record Number: [id number] Patient Account Number: [id number] Date of Birth/Sex: Treating RN: [birth year] (66 y.o. [name] Primary Care Devontre Siedschlag: [name] Other Clinician: Referring Lailie Smead: Treating Lemon Sternberg/Extender: [name] in Treatment: 8 Compression Therapy Performed for Wound Assessment: Wound #5 Left,Posterior Lower Leg Performed By: Clinician [name], RN Compression Type: Four Layer Post Procedure Diagnosis Same as Pre-procedure Electronic Signature(s) Signed: 04/19/2021 6:27:47 PM By: [name] RN, BSN Entered By: [name] on 04/19/2021 16:40:52 -------------------------------------------------------------------------------- Encounter Discharge Information Details Patient Name: Date of Service: Erin Face D. 04/19/2021 3:45 PM Medical Record Number: [id number] Patient Account Number: [id number] Date of Birth/Sex: Treating RN: Mar 01, 1955 (66 y.o. [name] Primary Care Kmya Placide: [name] Other Clinician: Referring Breylon Sherrow: Treating Louiza Moor/Extender: [name] in Treatment: 8 Encounter Discharge Information Items Post Procedure Vitals Discharge Condition: Stable Temperature (F): 97.6 Ambulatory Status: Cane Pulse (bpm): 73 Discharge Destination: Home Respiratory Rate (breaths/min): 20 Transportation: Private Auto Blood Pressure (mmHg): 160/73 Schedule Follow-up Appointment: Yes Clinical Summary of Care: Patient Declined Electronic Signature(s) Signed: 04/19/2021  5:20:27 PM By: Sharyn Creamer RN, BSN Entered By: Sharyn Creamer on 04/19/2021 17:09:41 -------------------------------------------------------------------------------- Lower Extremity Assessment Details Patient Name: Date of Service: Erin Face D. 04/19/2021 3:45 PM Medical Record Number: [id number] Patient Account Number: [id number] Date of Birth/Sex: Treating RN: 01/30/1956 (66 y.o. [name] Primary Care Uzziah Rigg: [name] Other Clinician: Referring Shaunice Levitan: Treating Adalyn Pennock/Extender: [name] in Treatment: 8 Edema Assessment Assessed: [Left: No] [Right: No] Edema: [Left: Ye] [Right: s] Calf Left: Right: Point of Measurement: 29 cm From Medial Instep 37 cm Ankle Left: Right: Point of Measurement: From Medial Instep 22 cm Vascular Assessment Pulses: Dorsalis Pedis Palpable: [Left:Yes]  Electronic Signature(s) Signed: 04/19/2021 6:27:47 PM By: [name] RN, BSN Entered By: [name] on 04/19/2021 16:19:55 -------------------------------------------------------------------------------- Multi Wound Chart Details Patient Name: Date of Service: Erin Face D. 04/19/2021 3:45 PM Medical Record Number: [id number] Patient Account Number: [id number] Date of Birth/Sex: Treating RN: [birth year] (66 y.o. F) Primary Care Rebeccah Ivins: [name] Other Clinician: Referring Lilyann Gravelle: Treating Daneille Desilva/Extender: [name] in Treatment: 8 Vital Signs Height(in): 62 Pulse(bpm): 73 Weight(lbs): 226 Blood Pressure(mmHg): 160/73 Body Mass Index(BMI): 41.3 Temperature(F): 97.6 Respiratory Rate(breaths/min): 20 Photos: [3:No Photos Left, Lateral Lower Leg] [5:No Photos Left, Posterior Lower Leg] [N/A:N/A N/A] Wound Location: [3:Blister] [5:Gradually Appeared] [N/A:N/A] Wounding Event: [3:Venous Leg Ulcer] [5:Venous Leg Ulcer] [N/A:N/A] Primary Etiology: [3:Cataracts, Lymphedema,] [5:Cataracts,  Lymphedema,] [N/A:N/A] Comorbid History: [3:Hypertension, Peripheral Venous Disease, Osteoarthritis 12/13/2020] [5:Hypertension, Peripheral Venous Disease, Osteoarthritis 04/12/2021] [N/A:N/A] Date Acquired: [3:8] [5:1] [N/A:N/A] Weeks of Treatment: [3:Open] [5:Open] [N/A:N/A] Wound Status: [3:No] [5:No] [N/A:N/A] Wound Recurrence: [3:3x3.2x0.2] [5:0.1x0.1x0.1] [N/A:N/A] Measurements L x W x D (cm) [3:7.54] [5:0.008] [N/A:N/A] A (cm) : rea [3:1.508] [5:0.001] [N/A:N/A] Volume (cm) : [3:74.10%] [5:99.50%] [N/A:N/A] % Reduction in A [3:rea: 74.10%] [5:99.40%] [N/A:N/A] % Reduction in Volume: [3:Full Thickness Without Exposed] [5:Full Thickness Without Exposed] [N/A:N/A] Classification: [3:Support Structures Large] [5:Support Structures Small] [N/A:N/A] Exudate A mount: [3:Serosanguineous] [5:Serosanguineous] [N/A:N/A] Exudate Type: [3:red, brown] [5:red, brown] [N/A:N/A] Exudate Color: [3:Flat and Intact] [5:Flat and Intact] [N/A:N/A] Wound Margin: [3:Medium (34-66%)] [5:Large (67-100%)] [N/A:N/A] Granulation A mount: [3:Pink] [5:Pink] [N/A:N/A] Granulation Quality: [3:Medium (34-66%)] [5:None Present (0%)] [N/A:N/A] Necrotic A mount: [3:Fat Layer (Subcutaneous Tissue): Yes Fat Layer (Subcutaneous Tissue): Yes N/A] Exposed Structures: [3:Fascia: No Tendon: No Muscle: No Joint: No Bone: No Small (1-33%)] [5:Fascia: No Tendon: No Muscle: No Joint: No Bone: No Large (67-100%)] [N/A:N/A] Epithelialization: [3:Debridement - Selective/Open Wound N/A] [N/A:N/A] Debridement: Pre-procedure Verification/Time Out 16:39 [5:N/A] [N/A:N/A] Taken: [3:Slough] [5:N/A] [N/A:N/A] Tissue Debrided: [3:Non-Viable Tissue] [5:N/A] [N/A:N/A] Level: [3:9.6] [5:N/A] [N/A:N/A] Debridement A (sq cm): [3:rea Curette] [5:N/A] [N/A:N/A] Instrument: [3:Minimum] [5:N/A] [N/A:N/A] Bleeding: [3:Pressure] [5:N/A] [N/A:N/A] Hemostasis A chieved: [3:0] [5:N/A] [N/A:N/A] Procedural Pain: [3:0] [5:N/A] [N/A:N/A] Post  Procedural Pain: [3:Procedure was tolerated well] [5:N/A] [N/A:N/A] Debridement Treatment Response: [3:3x3.2x0.2] [5:N/A] [N/A:N/A] Post Debridement Measurements L x W x D (cm) [3:1.508] [5:N/A] [N/A:N/A] Post Debridement Volume: (cm) [3:Compression Therapy] [5:Compression [place] [N/A:N/A] Procedures Performed: [3:Debridement] Treatment Notes Electronic Signature(s) Signed: 04/19/2021 5:00:18 PM By: [name] MD FACS Entered By: [name] on 04/19/2021 17:00:18 -------------------------------------------------------------------------------- Multi-Disciplinary Care Plan Details Patient Name: Date of Service: Erin Face D. 04/19/2021 3:45 PM Medical Record Number: [id number] Patient Account Number: [id number] Date of Birth/Sex: Treating RN: 12-25-1955 (66 y.o. [name] Primary Care My Madariaga: [name] Other Clinician: Referring Tobi Leinweber: Treating Melodie Ashworth/Extender: [name] in Treatment: 8 Multidisciplinary Care Plan reviewed with physician Active Inactive Venous Leg Ulcer Nursing Diagnoses: Actual venous Insuffiency (use after diagnosis is confirmed) Goals: Patient will maintain optimal edema control Date Initiated: 02/21/2021 Target Resolution Date: 05/19/2021 Goal Status: Active Patient/caregiver will verbalize understanding of disease process and disease management Date Initiated: 02/21/2021 Date Inactivated: 03/29/2021 Target Resolution Date: 03/24/2021 Goal Status: Met Interventions: Assess peripheral edema status every visit. Compression as ordered Provide education on venous insufficiency Notes: Wound/Skin Impairment Nursing Diagnoses: Impaired tissue integrity Knowledge deficit related to ulceration/compromised skin integrity Goals: Patient/caregiver will verbalize understanding of skin care regimen Date Initiated: 02/21/2021 Target Resolution Date: 05/19/2021 Goal Status: Active Ulcer/skin breakdown will  have a volume reduction of 30% by week 4 Date Initiated: 02/21/2021 Date Inactivated: 03/29/2021  Target Resolution Date: 03/24/2021 Goal Status: Unmet Unmet Reason: infection Ulcer/skin breakdown will have a volume reduction of 50% by week 8 Date Initiated: 03/29/2021 Date Inactivated: 04/19/2021 Target Resolution Date: 04/21/2021 Goal Status: Met Interventions: Assess patient/caregiver ability to obtain necessary supplies Assess patient/caregiver ability to perform ulcer/skin care regimen upon admission and as needed Assess ulceration(s) every visit Provide education on ulcer and skin care Notes: Electronic Signature(s) Signed: 04/19/2021 6:27:47 PM By: [name] RN, BSN Entered By: [name] on 04/19/2021 17:16:39 -------------------------------------------------------------------------------- Pain Assessment Details Patient Name: Date of Service: Erin Face D. 04/19/2021 3:45 PM Medical Record Number: [id number] Patient Account Number: [id number] Date of Birth/Sex: Treating RN: 1955-11-08 (66 y.o. F) Primary Care Aarav Burgett: [name] Other Clinician: Referring Yan Okray: Treating Zykee Avakian/Extender: [name] in Treatment: 8 Active Problems Location of Pain Severity and Description of Pain Patient Has Paino No Site Locations Pain Management and Medication Current Pain Management: Electronic Signature(s) Signed: 04/20/2021 8:29:09 AM By: Sandre Kitty Entered By: Sandre Kitty on 04/19/2021 15:56:18 -------------------------------------------------------------------------------- Patient/Caregiver Education Details Patient Name: Date of Service: [name] 3/8/2023andnbsp3:45 PM Medical Record Number: [id number] Patient Account Number: [id number] Date of Birth/Gender: Treating RN: 06/09/1955 (66 y.o. [name] Primary Care Physician: [name] Other Clinician: Referring Physician: Treating  Physician/Extender: [name] in Treatment: 8 Education Assessment Education Provided To: Patient Education Topics Provided Wound/Skin Impairment: Methods: Explain/Verbal Responses: State content correctly Electronic Signature(s) Signed: 04/19/2021 6:27:47 PM By: [name] RN, BSN Entered By: [name] on 04/19/2021 17:16:50 -------------------------------------------------------------------------------- Wound Assessment Details Patient Name: Date of Service: Erin Face D. 04/19/2021 3:45 PM Medical Record Number: [id number] Patient Account Number: [id number] Date of Birth/Sex: Treating RN: [birth year] (65 y.o. F) Primary Care Ilah Boule: [name] Other Clinician: Referring Lise Pincus: Treating Eban Weick/Extender: [name] in Treatment: 8 Wound Status Wound Number: 3 Primary Venous Leg Ulcer Etiology: Wound Location: Left, Lateral Lower Leg Wound Open Wounding Event: Blister Status: Date Acquired: 12/13/2020 Comorbid Cataracts, Lymphedema, Hypertension, Peripheral Venous Weeks Of Treatment: 8 History: Disease, Osteoarthritis Clustered Wound: No Photos Photo Uploaded By: Donavan Burnet on 04/19/2021 17:54:41 Wound Measurements Length: (cm) 3 Width: (cm) 3.2 Depth: (cm) 0.2 Area: (cm) 7.54 Volume: (cm) 1.508 % Reduction in Area: 74.1% % Reduction in Volume: 74.1% Epithelialization: Small (1-33%) Tunneling: No Undermining: No Wound Description Classification: Full Thickness Without Exposed Support Structures Wound Margin: Flat and Intact Exudate Amount: Large Exudate Type: Serosanguineous Exudate Color: red, brown Foul Odor After Cleansing: No Slough/Fibrino Yes Wound Bed Granulation Amount: Medium (34-66%) Exposed Structure Granulation Quality: Pink Fascia Exposed: No Necrotic Amount: Medium (34-66%) Fat Layer (Subcutaneous Tissue) Exposed: Yes Necrotic Quality: Adherent  Slough Tendon Exposed: No Muscle Exposed: No Joint Exposed: No Bone Exposed: No Treatment Notes Wound #3 (Lower Leg) Wound Laterality: Left, Lateral Cleanser Peri-Wound Care Zinc Oxide Ointment 30g tube Discharge Instruction: Apply Zinc Oxide to periwound with each dressing change Sween Lotion (Moisturizing lotion) Discharge Instruction: Apply moisturizing lotion as directed Topical Primary Dressing Promogran Prisma Matrix, 4.34 (sq in) (silver collagen) Discharge Instruction: Moisten collagen with saline or hydrogel Secondary Dressing ABD Pad, 8x10 Discharge Instruction: Apply over primary dressing as directed. Zetuvit Plus 4x8 in Discharge Instruction: Apply over primary dressing as directed. Drawtex 4x4 in Discharge Instruction: Apply over primary dressing as directed. Secured With Compression Wrap FourPress (4 layer compression wrap) Discharge Instruction: Apply four layer compression as directed. May also use Miliken CoFlex 2 layer compression system as alternative. Compression Stockings Add-Ons  Electronic Signature(s) Signed: 04/19/2021 6:27:47 PM By: [name] RN, BSN Entered By: [name] on 04/19/2021 16:20:43 -------------------------------------------------------------------------------- Wound Assessment Details Patient Name: Date of Service: Erin Gerold [name] 04/19/2021 3:45 PM Medical Record Number: [id number] Patient Account Number: [id number] Date of Birth/Sex: Treating RN: April 10, 1955 (66 y.o. F) Primary Care Sahana Boyland: [name] Other Clinician: Referring Navayah Sok: Treating Juliana Boling/Extender: [name] in Treatment: 8 Wound Status Wound Number: 5 Primary Venous Leg Ulcer Etiology: Wound Location: Left, Posterior Lower Leg Wound Open Wounding Event: Gradually Appeared Status: Status: Date Acquired: 04/12/2021 Comorbid Cataracts, Lymphedema, Hypertension, Peripheral Venous Weeks Of Treatment: 1 History:  Disease, Osteoarthritis Clustered Wound: No Photos Photo Uploaded By: Donavan Burnet on 04/19/2021 17:55:04 Wound Measurements Length: (cm) 0.1 Width: (cm) 0.1 Depth: (cm) 0.1 Area: (cm) 0.008 Volume: (cm) 0.001 % Reduction in Area: 99.5% % Reduction in Volume: 99.4% Epithelialization: Large (67-100%) Tunneling: No Undermining: No Wound Description Classification: Full Thickness Without Exposed Support Structures Wound Margin: Flat and Intact Exudate Amount: Small Exudate Type: Serosanguineous Exudate Color: red, brown Foul Odor After Cleansing: No Slough/Fibrino No Wound Bed Granulation Amount: Large (67-100%) Exposed Structure Granulation Quality: Pink Fascia Exposed: No Necrotic Amount: None Present (0%) Fat Layer (Subcutaneous Tissue) Exposed: Yes Tendon Exposed: No Muscle Exposed: No Joint Exposed: No Bone Exposed: No Treatment Notes Wound #5 (Lower Leg) Wound Laterality: Left, Posterior Cleanser Peri-Wound Care Zinc Oxide Ointment 30g tube Discharge Instruction: Apply Zinc Oxide to periwound with each dressing change Sween Lotion (Moisturizing lotion) Discharge Instruction: Apply moisturizing lotion as directed Topical Primary Dressing Promogran Prisma Matrix, 4.34 (sq in) (silver collagen) Discharge Instruction: Moisten collagen with saline or hydrogel Secondary Dressing ABD Pad, 8x10 Discharge Instruction: Apply over primary dressing as directed. Zetuvit Plus 4x8 in Discharge Instruction: Apply over primary dressing as directed. Drawtex 4x4 in Discharge Instruction: Apply over primary dressing as directed. Secured With Compression Wrap FourPress (4 layer compression wrap) Discharge Instruction: Apply four layer compression as directed. May also use Miliken CoFlex 2 layer compression system as alternative. Compression Stockings Add-Ons Electronic Signature(s) Signed: 04/19/2021 6:27:47 PM By: [name] RN, BSN Entered By: [name] on  04/19/2021 16:21:01 -------------------------------------------------------------------------------- [place] Details Patient Name: Date of Service: Erin Gerold [name] 04/19/2021 3:45 PM Medical Record Number: [id number] Patient Account Number: [id number] Date of Birth/Sex: Treating RN: 1955-11-11 (66 y.o. F) Primary Care Louvinia Cumbo: [name] Other Clinician: Referring Theda Payer: Treating Moncia Annas/Extender: [name] in Treatment: 8 Vital Signs Time Taken: 15:54 Temperature (F): 97.6 Height (in): 62 Pulse (bpm): 73 Weight (lbs): 226 Respiratory Rate (breaths/min): 20 Body Mass Index (BMI): 41.3 Blood Pressure (mmHg): 160/73 Reference Range: 80 - 120 mg / dl Electronic Signature(s) Signed: 04/20/2021 8:29:09 AM By: Sandre Kitty Entered By: Sandre Kitty on 04/19/2021 15:55:46

## 2021-04-26 ENCOUNTER — Other Ambulatory Visit (HOSPITAL_COMMUNITY): Payer: Self-pay

## 2021-04-26 ENCOUNTER — Other Ambulatory Visit: Payer: Self-pay

## 2021-04-26 ENCOUNTER — Encounter (HOSPITAL_BASED_OUTPATIENT_CLINIC_OR_DEPARTMENT_OTHER): Payer: 59 | Admitting: General Surgery

## 2021-04-26 ENCOUNTER — Other Ambulatory Visit: Payer: Self-pay | Admitting: Internal Medicine

## 2021-04-26 DIAGNOSIS — I872 Venous insufficiency (chronic) (peripheral): Secondary | ICD-10-CM | POA: Diagnosis not present

## 2021-04-26 DIAGNOSIS — L97822 Non-pressure chronic ulcer of other part of left lower leg with fat layer exposed: Secondary | ICD-10-CM | POA: Diagnosis not present

## 2021-04-26 DIAGNOSIS — M1990 Unspecified osteoarthritis, unspecified site: Secondary | ICD-10-CM | POA: Diagnosis not present

## 2021-04-26 DIAGNOSIS — Z6841 Body Mass Index (BMI) 40.0 and over, adult: Secondary | ICD-10-CM | POA: Diagnosis not present

## 2021-04-26 DIAGNOSIS — I70248 Atherosclerosis of native arteries of left leg with ulceration of other part of lower left leg: Secondary | ICD-10-CM | POA: Diagnosis not present

## 2021-04-26 DIAGNOSIS — L97828 Non-pressure chronic ulcer of other part of left lower leg with other specified severity: Secondary | ICD-10-CM | POA: Diagnosis not present

## 2021-04-26 DIAGNOSIS — I10 Essential (primary) hypertension: Secondary | ICD-10-CM | POA: Diagnosis not present

## 2021-04-26 DIAGNOSIS — E669 Obesity, unspecified: Secondary | ICD-10-CM | POA: Diagnosis not present

## 2021-04-26 MED ORDER — LEVOTHYROXINE SODIUM 112 MCG PO TABS
112.0000 ug | ORAL_TABLET | Freq: Every day | ORAL | 1 refills | Status: DC
  Filled 2021-04-26: qty 90, 90d supply, fill #0
  Filled 2021-08-04: qty 90, 90d supply, fill #1

## 2021-04-28 NOTE — Progress Notes (Signed)
LANAI, CONLEE ([id number]) ?Visit Report for 04/26/2021 ?Chief Complaint Document Details ?Patient Name: Date of Service: ?MARENA, WITTS 04/26/2021 3:30 PM ?Medical Record Number: [id number] ?Patient Account Number: [id number] ?Date of Birth/Sex: Treating RN: ?Oct 18, 1955 (66 y.o. F) ?Primary Care Provider: [name] Other Clinician: ?Referring Provider: ?Treating Provider/Extender: [name] ?[name] ?Weeks in Treatment: 9 ?Information Obtained from: Patient ?Chief Complaint ?04/19/2021: The patient is here for ongoing follow-up regarding 2 left lower extremity wounds. ?Electronic Signature(s) ?Signed: 04/26/2021 6:20:50 PM By: [name] MD FACS ?Entered By: [name] on 04/26/2021 18:20:50 ?-------------------------------------------------------------------------------- ?Debridement Details ?Patient Name: Date of Service: ?TESSI, EUSTACHE 04/26/2021 3:30 PM ?Medical Record Number: [id number] ?Patient Account Number: [id number] ?Date of Birth/Sex: Treating RN: ?1956-01-25 (66 y.o. Tonita Phoenix, Lauren ?Primary Care Provider: [name] Other Clinician: ?Referring Provider: ?Treating Provider/Extender: [name] ?[name] ?Weeks in Treatment: 9 ?Debridement Performed for Assessment: Wound #3 Left,Lateral Lower Leg ?Performed By: Physician [name], MD ?Debridement Type: Debridement ?Severity of Tissue Pre Debridement: Fat layer exposed ?Level of Consciousness (Pre-procedure): Awake and Alert ?Pre-procedure Verification/Time Out Yes - 15:55 ?Taken: ?Start Time: 15:55 ?Pain Control: Lidocaine ?T Area Debrided (L x W): ?otal 3 (cm) x 3 (cm) = 9 (cm?) ?Tissue and other material debrided: Viable, Non-Viable, Slough, Subcutaneous, Slough ?Level: Skin/Subcutaneous Tissue ?Debridement Description: Excisional ?Instrument: Curette ?Bleeding: Minimum ?Hemostasis Achieved: Pressure ?End Time: 15:55 ?Procedural Pain: 0 ?Post Procedural Pain: 0 ?Response to Treatment:  Procedure was tolerated well ?Level of Consciousness (Post- Awake and Alert ?procedure): ?Post Debridement Measurements of Total Wound ?Length: (cm) 3 ?Width: (cm) 3 ?Depth: (cm) 0.1 ?Volume: (cm?) 0.707 ?Character of Wound/Ulcer Post Debridement: Improved ?Severity of Tissue Post Debridement: Fat layer exposed ?Post Procedure Diagnosis ?Same as Pre-procedure ?Electronic Signature(s) ?Signed: 04/26/2021 6:42:44 PM By: [name] MD FACS ?Signed: 04/28/2021 12:57:05 PM By: Rhae Hammock RN ?Entered By: Rhae Hammock on 04/26/2021 15:57:54 ?-------------------------------------------------------------------------------- ?HPI Details ?Patient Name: Date of Service: ?MARELLA, VANDERPOL 04/26/2021 3:30 PM ?Medical Record Number: [id number] ?Patient Account Number: [id number] ?Date of Birth/Sex: Treating RN: ?[birth year] (66 y.o. 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 She tells [name] that she fell on the gravel driveway in July. She developed injuries on ?her distal lower leg which have not healed. She saw her primary physician on 11/15/2017 who noted her left shin injuries. Gave her antibiotics. At that point the ?wounds were almost circumferential however most were less than 1.5 cm. Weeping edema fluid was noted. She was referred here for evaluation. The patient ?has a history of chronic lower extremity edema. 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 She has had prior history with leg weeping fluid. She does not wear compression stockings. She is not doing ?anything specific to these wound areas. ?The patient has a history of obesity,  arthritis, peripheral vascular disease hypertension lower extremity edema and Schamberg's disease ?ABI in our clinic was 1.3 on the left ?12/17/2017; patient readmitted to the clinic last week. 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 She also has ?lymphedema. Put her in 3 layer compression and silver alginate last week. She has 3 small wounds with depth just lateral to the tibia.  More problematically than ?this she has numerous shallow areas some of which are almost canal like in shape with tightly adherent painful debris. It would be very difficult and time- ?consuming to go through this and attempt to individually debride all these areas.. I changed her to collagen today to see if that would help with any of the ?surface debris on some of these wounds. Otherwise we will not be able to put this in compression we have to have someone change the dressing. The patient ?is not eligible for home health ?12/25/17 on evaluation today patient actually appears to be doing rather well in regard to the ulcer on her lower extremity. Fortunately there does not appear to ?be evidence of infection at this time. She has been tolerating the dressing changes without complication. This includes the compression wrap. The only issue ?she had was that the wrap was initially placed over her bunion region which actually calls her some discomfort and pain. Other than that things seem to be ?going rather well. ?01/01/2018 Seen today for follow-up and management of left lower extremity wound and lymphedema. T oday she presents with a new wound towards to the left ?lateral LE. Recently treated with a 7 day course of amoxicillin; reason for antibiotic dose is unknown at this time. Tolerating current treatment of collagen with 4- ?layer wraps. She obtained a venous reflux study on 12/25/17. Studies show on the right abnormal reflux times of the popliteal vein, great saphenous vein at ?the  saphenofemoral junction at the proximal thigh, great saphenous vein at the mid calf, and origin of the small saphenous vein.No superficial thrombosis. No ?deep vein thrombosis in the common femoral, femoral,and popliteal veins. Left abnormal reflex times as well of the common femoral vein, popliteal vein, and ?a great saphenous vein at the saphenofemoral junction, In great saphenous vein at the mid thigh w/o thrombosis. Has any issues or concerns during visit ?today. Recommended follow-up to vascular specialist due to abnormalities from the venous reflux study. Denies fever, pain, chills, dizziness, nausea, or ?vomiting. ?01/08/18 upon evaluation today the patient actually seems to be showing some signs of improvement in my opinion at this point in regard to the lower ?extremity ulcerated areas. She still has a lot of drainage but fortunately nothing that appears to be too significant currently. I have been very happy with the ?overall progress I see today compared to where things were during the last evaluation that I had with her. Nonetheless she has not had her appointment with ?the vein specialist as of yet in fact we were able to get this approved for her today and confirmed with them she will be seeing them on December 26. ?Nonetheless in general I do feel like the compression wraps is doing well for her. ?01/17/18; quite a bit of improvement since last time I saw this patient she has a small open area remaining on the left lateral calf and even smaller area ?medially. She has lymphedema chronic stasis changes with distal skin fibrosis. She has an appointment with vascular surgery later this month ?01/24/2018; the patient's medial leg has closed. Still a small open area on the left lateral leg. She states that the 4 layer compression we put on last week was ?too tight and she had to take it off over a few days ago. We have had resultant increase in her lymphedema in the dorsal foot and a proximal calf.  Fortunately ?that does not seem to have resulted in any deterioration in her wounds ?  The patient is going to need compression stockings. We have given her measurements to phone elastic therapy in [place]. She has va

## 2021-05-02 NOTE — Progress Notes (Signed)
[name], [name]. ([id number]) ?Visit Report for 04/26/2021 ?Arrival Information Details ?Patient Name: Date of Service: ?[name], Erin [name] 04/26/2021 3:30 PM ?Medical Record Number: [id number] ?Patient Account Number: [id number] ?Date of Birth/Sex: Treating RN: ?06/27/1955 (65 y.o. F) Breedlove, Lauren ?Primary Care : Sanders, Robyn Other Clinician: ?Referring : ?Treating /Extender: Cannon, Jennifer ?Sanders, Robyn ?Weeks in Treatment: 9 ?Visit Information History Since Last Visit ?Added or deleted any medications: No ?Patient Arrived: Ambulatory ?Any new allergies or adverse reactions: No ?Arrival Time: 15:37 ?Had a fall or experienced change in No ?Accompanied By: self ?activities of daily living that may affect ?Transfer Assistance: None ?risk of falls: ?Patient Identification Verified: Yes ?Signs or symptoms of abuse/neglect since last visito No ?Secondary Verification Process Completed: Yes ?Hospitalized since last visit: No ?Patient Requires Transmission-Based Precautions: No ?Implantable device outside of the clinic excluding No ?Patient Has Alerts: No ?cellular tissue based products placed in the center ?since last visit: ?Has Dressing in Place as Prescribed: Yes ?Has Compression in Place as Prescribed: Yes ?Pain Present Now: No ?Electronic Signature(s) ?Signed: 04/28/2021 12:57:05 PM By: Breedlove, Lauren RN ?Entered By: Breedlove, Lauren on 04/26/2021 15:38:32 ?-------------------------------------------------------------------------------- ?Lower Extremity Assessment Details ?Patient Name: Date of Service: ?[name], Erin [name] 04/26/2021 3:30 PM ?Medical Record Number: [id number] ?Patient Account Number: [id number] ?Date of Birth/Sex: Treating RN: ?05/11/1955 (65 y.o. F) Breedlove, Lauren ?Primary Care : Sanders, Robyn Other Clinician: ?Referring : ?Treating /Extender: Cannon, Jennifer ?Sanders, Robyn ?Weeks in Treatment: 9 ?Edema Assessment ?Assessed: [Left: Yes]  [Right: No] ?Edema: [Left: Ye] [Right: s] ?Calf ?Left: Right: ?Point of Measurement: 29 cm From Medial Instep 46 cm ?Ankle ?Left: Right: ?Point of Measurement: From Medial Instep 26 cm ?Vascular Assessment ?Pulses: ?Dorsalis Pedis ?Palpable: [Left:Yes] ?Posterior Tibial ?Palpable: [Left:Yes] ?Electronic Signature(s) ?Signed: 04/28/2021 12:57:05 PM By: Breedlove, Lauren RN ?Entered By: Breedlove, Lauren on 04/26/2021 15:42:08 ?-------------------------------------------------------------------------------- ?Multi Wound Chart Details ?Patient Name: ?Date of Service: ?[name], Erin [name] 04/26/2021 3:30 PM ?Medical Record Number: [id number] ?Patient Account Number: [id number] ?Date of Birth/Sex: ?Treating RN: ?07/19/1955 (65 y.o. F) ?Primary Care : Sanders, Robyn ?Other Clinician: ?Referring : ?Treating /Extender: Cannon, Jennifer ?Sanders, Robyn ?Weeks in Treatment: 9 ?Vital Signs ?Height(in): 62 ?Pulse(bpm): 88 ?Weight(lbs): 226 ?Blood Pressure(mmHg): 139/74 ?Body Mass Index(BMI): 41.3 ?Temperature(??F): 98.4 ?Respiratory Rate(breaths/min): 17 ?Photos: [5:No Photos] ?Left, Lateral Lower Leg Left, Posterior Lower Leg Left, Posterior Lower Leg ?Wound Location: ?Blister Gradually Appeared Gradually Appeared ?Wounding Event: ?Venous Leg Ulcer Venous Leg Ulcer Venous Leg Ulcer ?Primary Etiology: ?Cataracts, Lymphedema, N/A Cataracts, Lymphedema, ?Comorbid History: ?Hypertension, Peripheral Venous Hypertension, Peripheral Venous ?Disease, Osteoarthritis Disease, Osteoarthritis ?12/13/2020 04/12/2021 04/12/2021 ?Date Acquired: ?9 0 2 ?Weeks of Treatment: ?Open Open Open ?Wound Status: ?No No No ?Wound Recurrence: ?3x3x0.1 0x0x0 0x0x0 ?Measurements L x W x D (cm) ?7.069 0 0 ?A (cm?) : ?rea ?0.707 0 0 ?Volume (cm?) : ?75.70% 100.00% 100.00% ?% Reduction in A rea: ?87.80% 100.00% 100.00% ?% Reduction in Volume: ?Full Thickness Without Exposed Full Thickness Without Exposed Full Thickness Without  Exposed ?Classification: ?Support Structures Support Structures Support Structures ?Large N/A Small ?Exudate A mount: ?Serosanguineous N/A Serosanguineous ?Exudate Type: ?red, brown N/A red, brown ?Exudate Color: ?Flat and Intact N/A Flat and Intact ?Wound Margin: ?Medium (34-66%) N/A Large (67-100%) ?Granulation A mount: ?Pink N/A Pink ?Granulation Quality: ?Medium (34-66%) N/A None Present (0%) ?Necrotic A mount: ?Fat Layer (Subcutaneous Tissue): Yes N/A ?Fat Layer (Subcutaneous Tissue): Yes ?Exposed Structures: ?Fascia: No ?Fascia: No ?Tendon: No ?Tendon: No ?Muscle: No ?Muscle: No ?Joint: No ?Joint: No ?Bone: No ?  Bone: No ?Small (1-33%) N/A Large (67-100%) ?Epithelialization: ?Debridement - Excisional N/A N/A ?Debridement: ?Pre-procedure Verification/Time Out 15:55 N/A N/A ?Taken: ?Lidocaine N/A N/A ?Pain Control: ?Subcutaneous, Slough N/A N/A ?Tissue Debrided: ?Skin/Subcutaneous Tissue N/A N/A ?Level: ?9 N/A N/A ?Debridement A (sq cm): ?rea ?Curette N/A N/A ?Instrument: ?Minimum N/A N/A ?Bleeding: ?Pressure N/A N/A ?Hemostasis A chieved: ?0 N/A N/A ?Procedural Pain: ?0 N/A N/A ?Post Procedural Pain: ?Procedure was tolerated well N/A N/A ?Debridement Treatment Response: ?3x3x0.1 N/A N/A ?Post Debridement Measurements L x ?W x D (cm) ?0.707 N/A N/A ?Post Debridement Volume: (cm?) ?Debridement N/A N/A ?Procedures Performed: ?Treatment Notes ?Wound #3 (Lower Leg) Wound Laterality: Left, Lateral ?Cleanser ?Peri-Wound Care ?Zinc Oxide Ointment 30g tube ?Discharge Instruction: Apply Zinc Oxide to periwound with each dressing change ?Sween Lotion (Moisturizing lotion) ?Discharge Instruction: Apply moisturizing lotion as directed ?Topical ?Primary Dressing ?Promogran Prisma Matrix, 4.34 (sq in) (silver collagen) ?Discharge Instruction: Moisten collagen with saline or hydrogel ?Secondary Dressing ?ABD Pad, 8x10 ?Discharge Instruction: Apply over primary dressing as directed. ?Zetuvit Plus 4x8 in ?Discharge Instruction:  Apply over primary dressing as directed. ?Drawtex 4x4 in ?Discharge Instruction: Apply over primary dressing as directed. ?Secured With ?Compression Wrap ?FourPress (4 layer compression wrap) ?Discharge Instruction: Apply four layer compression as directed. May also use Miliken CoFlex 2 layer compression system as alternative. ?Compression Stockings ?Add-Ons ?Wound #5 (Lower Leg) Wound Laterality: Left, Posterior ?Cleanser ?Peri-Wound Care ?Topical ?Primary Dressing ?Secondary Dressing ?Secured With ?Compression Wrap ?Compression Stockings ?Add-Ons ?Electronic Signature(s) ?Signed: 04/26/2021 6:20:18 PM By: Cannon, Jennifer MD FACS ?Entered By: Cannon, Jennifer on 04/26/2021 18:20:18 ?-------------------------------------------------------------------------------- ?Multi-Disciplinary Care Plan Details ?Patient Name: ?Date of Service: ?[name], Erin [name] 04/26/2021 3:30 PM ?Medical Record Number: [id number] ?Patient Account Number: [id number] ?Date of Birth/Sex: ?Treating RN: ?04/04/1955 (65 y.o. F) Breedlove, Lauren ?Primary Care : Sanders, Robyn ?Other Clinician: ?Referring : ?Treating /Extender: Cannon, Jennifer ?Sanders, Robyn ?Weeks in Treatment: 9 ?Multidisciplinary Care Plan reviewed with physician ?Active Inactive ?Venous Leg Ulcer ?Nursing Diagnoses: ?Actual venous Insuffiency (use after diagnosis is confirmed) ?Goals: ?Patient will maintain optimal edema control ?Date Initiated: 02/21/2021 ?Target Resolution Date: 05/19/2021 ?Goal Status: Active ?Patient/caregiver will verbalize understanding of disease process and disease management ?Date Initiated: 02/21/2021 ?Date Inactivated: 03/29/2021 ?Target Resolution Date: 03/24/2021 ?Goal Status: Met ?Interventions: ?Assess peripheral edema status every visit. ?Compression as ordered ?Provide education on venous insufficiency ?Notes: ?Wound/Skin Impairment ?Nursing Diagnoses: ?Impaired tissue integrity ?Knowledge deficit related to  ulceration/compromised skin integrity ?Goals: ?Patient/caregiver will verbalize understanding of skin care regimen ?Date Initiated: 02/21/2021 ?Target Resolution Date: 05/19/2021 ?Goal Status: Active ?Ulcer/skin breakdown will have a volum

## 2021-05-03 ENCOUNTER — Encounter (HOSPITAL_BASED_OUTPATIENT_CLINIC_OR_DEPARTMENT_OTHER): Payer: 59 | Admitting: General Surgery

## 2021-05-03 ENCOUNTER — Other Ambulatory Visit: Payer: Self-pay

## 2021-05-03 DIAGNOSIS — I10 Essential (primary) hypertension: Secondary | ICD-10-CM | POA: Diagnosis not present

## 2021-05-03 DIAGNOSIS — M1990 Unspecified osteoarthritis, unspecified site: Secondary | ICD-10-CM | POA: Diagnosis not present

## 2021-05-03 DIAGNOSIS — I872 Venous insufficiency (chronic) (peripheral): Secondary | ICD-10-CM | POA: Diagnosis not present

## 2021-05-03 DIAGNOSIS — I87332 Chronic venous hypertension (idiopathic) with ulcer and inflammation of left lower extremity: Secondary | ICD-10-CM | POA: Diagnosis not present

## 2021-05-03 DIAGNOSIS — Z6841 Body Mass Index (BMI) 40.0 and over, adult: Secondary | ICD-10-CM | POA: Diagnosis not present

## 2021-05-03 DIAGNOSIS — I70248 Atherosclerosis of native arteries of left leg with ulceration of other part of lower left leg: Secondary | ICD-10-CM | POA: Diagnosis not present

## 2021-05-03 DIAGNOSIS — L97822 Non-pressure chronic ulcer of other part of left lower leg with fat layer exposed: Secondary | ICD-10-CM | POA: Diagnosis not present

## 2021-05-03 DIAGNOSIS — L97828 Non-pressure chronic ulcer of other part of left lower leg with other specified severity: Secondary | ICD-10-CM | POA: Diagnosis not present

## 2021-05-03 DIAGNOSIS — E669 Obesity, unspecified: Secondary | ICD-10-CM | POA: Diagnosis not present

## 2021-05-03 NOTE — Progress Notes (Signed)
Erin, [name] ([id number]) ?Visit Report for 05/03/2021 ?Chief Complaint Document Details ?Patient Name: Date of Service: ?[name], [name] 05/03/2021 3:00 PM ?Medical Record Number: [id number] ?Patient Account Number: [id number] ?Date of Birth/Sex: Treating RN: ?Jul 10, 1955 (66 y.o. F) ?Primary Care Provider: [name] Other Clinician: ?Referring Provider: ?Treating Provider/Extender: [name] ?[name] ?Weeks in Treatment: 10 ?Information Obtained from: Patient ?Chief Complaint ?04/19/2021: The patient is here for ongoing follow-up regarding 2 left lower extremity wounds. ?Electronic Signature(s) ?Signed: 05/03/2021 3:27:40 PM By: [name] MD FACS ?Entered By: [name] on 05/03/2021 15:27:40 ?-------------------------------------------------------------------------------- ?Debridement Details ?Patient Name: Date of Service: ?Erin, [name] 05/03/2021 3:00 PM ?Medical Record Number: [id number] ?Patient Account Number: [id number] ?Date of Birth/Sex: Treating RN: ?12/29/1955 (66 y.o. F) Sharyn Creamer ?Primary Care Provider: [name] Other Clinician: ?Referring Provider: ?Treating Provider/Extender: [name] ?[name] ?Weeks in Treatment: 10 ?Debridement Performed for Assessment: Wound #3 Left,Lateral Lower Leg ?Performed By: Physician [name], MD ?Debridement Type: Debridement ?Severity of Tissue Pre Debridement: Fat layer exposed ?Level of Consciousness (Pre-procedure): Awake and Alert ?Pre-procedure Verification/Time Out Yes - 15:23 ?Taken: ?Start Time: 15:23 ?Pain Control: ?Other : Benzocaine ?T Area Debrided (L x W): ?otal 2.7 (cm) x 3.1 (cm) = 8.37 (cm?) ?Tissue and other material debrided: Non-Viable, [place], [place] ?Level: Non-Viable Tissue ?Debridement Description: Selective/Open Wound ?Instrument: Curette ?Bleeding: Minimum ?Hemostasis Achieved: Pressure ?Procedural Pain: 0 ?Post Procedural Pain: 0 ?Response to Treatment: Procedure was tolerated  well ?Level of Consciousness (Post- Awake and Alert ?procedure): ?Post Debridement Measurements of Total Wound ?Length: (cm) 2.7 ?Width: (cm) 3.1 ?Depth: (cm) 0.2 ?Volume: (cm?) 1.315 ?Character of Wound/Ulcer Post Debridement: Improved ?Severity of Tissue Post Debridement: Fat layer exposed ?Post Procedure Diagnosis ?Same as Pre-procedure ?Electronic Signature(s) ?Signed: 05/03/2021 4:21:04 PM By: Sharyn Creamer RN, BSN ?Signed: 05/03/2021 4:48:09 PM By: [name] MD FACS ?Entered By: Sharyn Creamer on 05/03/2021 15:26:32 ?-------------------------------------------------------------------------------- ?HPI Details ?Patient Name: Date of Service: ?Erin, [name] 05/03/2021 3:00 PM ?Medical Record Number: [id number] ?Patient Account Number: [id number] ?Date of Birth/Sex: Treating RN: ?Oct 07, 1955 (66 y.o. 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 She tells [name] that she fell on the gravel driveway in July. She developed injuries on ?her distal lower leg which have not healed. She saw her primary physician on 11/15/2017 who noted her left shin injuries. Gave her antibiotics. At that point the ?wounds were almost circumferential however most were less than 1.5 cm. Weeping edema fluid was noted. She was referred here for evaluation. The patient ?has a history of chronic lower extremity edema. 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 She has had prior history with leg weeping fluid. She does not wear compression stockings. She is not doing ?anything specific to these wound areas. ?The patient has a history of obesity, arthritis, peripheral  vascular disease hypertension lower extremity edema and Schamberg's disease ?ABI in our clinic was 1.3 on the left ?12/17/2017; patient readmitted to the clinic last week. 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 She also has ?lymphedema. Put her in 3 layer compression and silver alginate last week. She has 3 small wounds with depth just lateral to the tibia. More  problematically than ?this she has numerous shallow areas some of which are almost canal like in shape with tightly adherent painful debris. It would be very difficult and time- ?consuming to go through this and attempt to individually debride all these areas.. I changed her to collagen today to see if that would help with any of the ?surface debris on some of these wounds. Otherwise we will not be able to put this in compression we have to have someone change the dressing. The patient ?is not eligible for home health ?12/25/17 on evaluation today patient actually appears to be doing rather well in regard to the ulcer on her lower extremity. Fortunately there does not appear to ?be evidence of infection at this time. She has been tolerating the dressing changes without complication. This includes the compression wrap. The only issue ?she had was that the wrap was initially placed over her bunion region which actually calls her some discomfort and pain. Other than that things seem to be ?going rather well. ?01/01/2018 Seen today for follow-up and management of left lower extremity wound and lymphedema. T oday she presents with a new wound towards to the left ?lateral LE. Recently treated with a 7 day course of amoxicillin; reason for antibiotic dose is unknown at this time. Tolerating current treatment of collagen with 4- ?layer wraps. She obtained a venous reflux study on 12/25/17. Studies show on the right abnormal reflux times of the popliteal vein, great saphenous vein at ?the saphenofemoral junction  at the proximal thigh, great saphenous vein at the mid calf, and origin of the small saphenous vein.No superficial thrombosis. No ?deep vein thrombosis in the common femoral, femoral,and popliteal veins. Left abnormal reflex times as well of the common femoral vein, popliteal vein, and ?a great saphenous vein at the saphenofemoral junction, In great saphenous vein at the mid thigh w/o thrombosis. Has any issues or concerns during visit ?today. Recommended follow-up to vascular specialist due to abnormalities from the venous reflux study. Denies fever, pain, chills, dizziness, nausea, or ?vomiting. ?01/08/18 upon evaluation today the patient actually seems to be showing some signs of improvement in my opinion at this point in regard to the lower ?extremity ulcerated areas. She still has a lot of drainage but fortunately nothing that appears to be too significant currently. I have been very happy with the ?overall progress I see today compared to where things were during the last evaluation that I had with her. Nonetheless she has not had her appointment with ?the vein specialist as of yet in fact we were able to get this approved for her today and confirmed with them she will be seeing them on December 26. ?Nonetheless in general I do feel like the compression wraps is doing well for her. ?01/17/18; quite a bit of improvement since last time I saw this patient she has a small open area remaining on the left lateral calf and even smaller area ?medially. She has lymphedema chronic stasis changes with distal skin fibrosis. She has an appointment with vascular surgery later this month ?01/24/2018; the patient's medial leg has closed. Still a small open area on the left lateral leg. She states that the 4 layer compression we put on last week was ?too tight and she had to take it off over a few days ago. We have had resultant increase in her lymphedema in the dorsal foot and a proximal calf. Fortunately ?that does not seem  to have resulted in any deterioration in her wounds ?The  patient is going to need compression stockings. We have given her measurements to phone elastic therapy in [place]. She has vascular surgery co

## 2021-05-03 NOTE — Progress Notes (Addendum)
[name], STRUTHERS ([id number]) ?Visit Report for 05/03/2021 ?Arrival Information Details ?Patient Name: Date of Service: ?Erin, [name] 05/03/2021 3:00 PM ?Medical Record Number: [id number] ?Patient Account Number: [id number] ?Date of Birth/Sex: Treating RN: ?02/18/1955 (66 y.o. F) ?Primary Care Jaxsyn Catalfamo: [name] Other Clinician: ?Referring Cadell Gabrielson: ?Treating Sabino Denning/Extender: [name] ?[name] ?Weeks in Treatment: 10 ?Visit Information History Since Last Visit ?Added or deleted any medications: No ?Patient Arrived: [name] ?Any new allergies or adverse reactions: No ?Arrival Time: 14:54 ?Had a fall or experienced change in No ?Accompanied By: self ?activities of daily living that may affect ?Transfer Assistance: None ?risk of falls: ?Patient Identification Verified: Yes ?Signs or symptoms of abuse/neglect since last visito No ?Secondary Verification Process Completed: Yes ?Hospitalized since last visit: No ?Patient Requires Transmission-Based Precautions: No ?Implantable device outside of the clinic excluding No ?Patient Has Alerts: No ?cellular tissue based products placed in the center ?since last visit: ?Has Dressing in Place as Prescribed: Yes ?Pain Present Now: No ?Electronic Signature(s) ?Signed: 05/03/2021 3:07:13 PM By: Sandre Kitty ?Entered By: Sandre Kitty on 05/03/2021 14:55:13 ?-------------------------------------------------------------------------------- ?Compression Therapy Details ?Patient Name: Date of Service: ?ADITI, ROVIRA 05/03/2021 3:00 PM ?Medical Record Number: [id number] ?Patient Account Number: [id number] ?Date of Birth/Sex: Treating RN: ?[birth year] (66 y.o. F) Sharyn Creamer ?Primary Care Demesha Boorman: [name] Other Clinician: ?Referring Jahzara Slattery: ?Treating Manika Hast/Extender: [name] ?[name] ?Weeks in Treatment: 10 ?Compression Therapy Performed for Wound Assessment: Wound #3 Left,Lateral Lower Leg ?Performed By: Clinician Sharyn Creamer, RN ?Compression Type: Double Layer ?Post Procedure Diagnosis ?Same as Pre-procedure ?Electronic Signature(s) ?Signed: 05/03/2021 4:21:04 PM By: Sharyn Creamer RN, BSN ?Entered By: Sharyn Creamer on 05/03/2021 16:01:51 ?-------------------------------------------------------------------------------- ?Encounter Discharge Information Details ?Patient Name: ?Date of Service: ?[name], [name] 05/03/2021 3:00 PM ?Medical Record Number: [id number] ?Patient Account Number: [id number] ?Date of Birth/Sex: ?Treating RN: ?1955/08/28 (66 y.o. F) Sharyn Creamer ?Primary Care [name]: [name] ?Other Clinician: ?Referring Yaquelin Langelier: ?Treating Maleeya Peterkin/Extender: [name] ?[name] ?Weeks in Treatment: 10 ?Encounter Discharge Information Items Post Procedure Vitals ?Discharge Condition: Stable ?Temperature (F): 98.0 ?Ambulatory Status: [name] ?Pulse (bpm): 80 ?Discharge Destination: Home ?Respiratory Rate (breaths/min): 17 ?Transportation: Private Auto ?Blood Pressure (mmHg): 145/68 ?Schedule Follow-up Appointment: Yes ?Clinical Summary of Care: Patient Declined ?Electronic Signature(s) ?Signed: 05/03/2021 4:21:04 PM By: Sharyn Creamer RN, BSN ?Entered By: Sharyn Creamer on 05/03/2021 16:04:24 ?-------------------------------------------------------------------------------- ?Lower Extremity Assessment Details ?Patient Name: ?Date of Service: ?[name], RENALDO 05/03/2021 3:00 PM ?Medical Record Number: [id number] ?Patient Account Number: [id number] ?Date of Birth/Sex: ?Treating RN: ?02-13-1955 (66 y.o. F) Sharyn Creamer ?Primary Care Corita Allinson: [name] ?Other Clinician: ?Referring Zenda Herskowitz: ?Treating Jacci Ruberg/Extender: [name] ?[name] ?Weeks in Treatment: 10 ?Edema Assessment ?Assessed: [Left: No] [Right: No] ?Edema: [Left: Ye] [Right: s] ?Calf ?Left: Right: ?Point of Measurement: 29 cm From Medial Instep 45.2 cm ?Ankle ?Left: Right: ?Point of Measurement: From Medial Instep 23.5  cm ?Vascular Assessment ?Pulses: ?Dorsalis Pedis ?Palpable: [Left:Yes] ?Electronic Signature(s) ?Signed: 05/03/2021 4:21:04 PM By: Sharyn Creamer RN, BSN ?Entered By: Sharyn Creamer on 05/03/2021 15:13:45 ?-------------------------------------------------------------------------------- ?Multi Wound Chart Details ?Patient Name: ?Date of Service: ?Erin, [name] 05/03/2021 3:00 PM ?Medical Record Number: [id number] ?Patient Account Number: [id number] ?Date of Birth/Sex: ?Treating RN: ?19-Jan-1956 (66 y.o. F) ?Primary Care Amber Williard: [name] ?Other Clinician: ?Referring Amarian Botero: ?Treating Roman Sandall/Extender: [name] ?[name] ?Weeks in Treatment: 10 ?Vital Signs ?Height(in): 62 ?Pulse(bpm): 80 ?Weight(lbs): 226 ?Blood Pressure(mmHg): 145/68 ?Body Mass Index(BMI): 41.3 ?Temperature(??F): 98.0 ?Respiratory Rate(breaths/min): 17 ?Photos: [N/A:N/A] ?Left, Lateral Lower Leg N/A N/A ?  Wound Location: ?Blister N/A N/A ?Wounding Event: ?Venous Leg Ulcer N/A N/A ?Primary Etiology: ?Cataracts, Lymphedema, N/A N/A ?Comorbid History: ?Hypertension, Peripheral Venous ?Disease, Osteoarthritis ?12/13/2020 N/A N/A ?Date Acquired: ?6 N/A N/A ?Weeks of Treatment: ?Open N/A N/A ?Wound Status: ?No N/A N/A ?Wound Recurrence: ?2.7x3.1x0.2 N/A N/A ?Measurements L x W x D (cm) ?6.574 N/A N/A ?A (cm?) : ?rea ?1.315 N/A N/A ?Volume (cm?) : ?77.40% N/A N/A ?% Reduction in A rea: ?77.40% N/A N/A ?% Reduction in Volume: ?Full Thickness Without Exposed N/A N/A ?Classification: ?Support Structures ?Large N/A N/A ?Exudate A mount: ?Serosanguineous N/A N/A ?Exudate Type: ?red, brown N/A N/A ?Exudate Color: ?Flat and Intact N/A N/A ?Wound Margin: ?Medium (34-66%) N/A N/A ?Granulation A mount: ?Pink N/A N/A ?Granulation Quality: ?Medium (34-66%) N/A N/A ?Necrotic A mount: ?Fat Layer (Subcutaneous Tissue): Yes N/A N/A ?Exposed Structures: ?Fascia: No ?Tendon: No ?Muscle: No ?Joint: No ?Bone: No ?Small (1-33%) N/A  N/A ?Epithelialization: ?Debridement - Selective/Open Wound N/A N/A ?Debridement: ?Pre-procedure Verification/Time Out 15:23 N/A N/A ?Taken: ?Other N/A N/A ?Pain Control: ?Slough N/A N/A ?Tissue Debrided: ?Non-Viable Tissue N/A N/A ?Level: ?8.37 N/A N/A ?Debridement A (sq cm): ?rea ?Curette N/A N/A ?Instrument: ?Minimum N/A N/A ?Bleeding: ?Pressure N/A N/A ?Hemostasis A chieved: ?0 N/A N/A ?Procedural Pain: ?0 N/A N/A ?Post Procedural Pain: ?Procedure was tolerated well N/A N/A ?Debridement Treatment Response: ?2.7x3.1x0.2 N/A N/A ?Post Debridement Measurements L x ?W x D (cm) ?1.315 N/A N/A ?Post Debridement Volume: (cm?) ?Debridement N/A N/A ?Procedures Performed: ?Treatment Notes ?Electronic Signature(s) ?Signed: 05/03/2021 3:27:29 PM By: [name] MD FACS ?Entered By: [name] on 05/03/2021 15:27:29 ?-------------------------------------------------------------------------------- ?Multi-Disciplinary Care Plan Details ?Patient Name: ?Date of Service: ?VENETA, SLITER 05/03/2021 3:00 PM ?Medical Record Number: [id number] ?Patient Account Number: [id number] ?Date of Birth/Sex: ?Treating RN: ?1955-10-10 (66 y.o. F) Sharyn Creamer ?Primary Care Karley Pho: [name] ?Other Clinician: ?Referring Edia Pursifull: ?Treating Dillion Stowers/Extender: [name] ?[name] ?Weeks in Treatment: 10 ?Multidisciplinary Care Plan reviewed with physician ?Active Inactive ?Venous Leg Ulcer ?Nursing Diagnoses: ?Actual venous Insuffiency (use after diagnosis is confirmed) ?Goals: ?Patient will maintain optimal edema control ?Date Initiated: 02/21/2021 ?Target Resolution Date: 05/19/2021 ?Goal Status: Active ?Patient/caregiver will verbalize understanding of disease process and disease management ?Date Initiated: 02/21/2021 ?Date Inactivated: 03/29/2021 ?Target Resolution Date: 03/24/2021 ?Goal Status: Met ?Interventions: ?Assess peripheral edema status every visit. ?Compression as ordered ?Provide education on venous  insufficiency ?Notes: ?Wound/Skin Impairment ?Nursing Diagnoses: ?Impaired tissue integrity ?Knowledge deficit related to ulceration/compromised skin integrity ?Goals: ?Patient/caregiver will verbalize understanding of skin care regimen ?Dat

## 2021-05-10 ENCOUNTER — Other Ambulatory Visit: Payer: Self-pay

## 2021-05-10 ENCOUNTER — Encounter (HOSPITAL_BASED_OUTPATIENT_CLINIC_OR_DEPARTMENT_OTHER): Payer: 59 | Admitting: General Surgery

## 2021-05-10 DIAGNOSIS — L97822 Non-pressure chronic ulcer of other part of left lower leg with fat layer exposed: Secondary | ICD-10-CM | POA: Diagnosis not present

## 2021-05-10 DIAGNOSIS — M1990 Unspecified osteoarthritis, unspecified site: Secondary | ICD-10-CM | POA: Diagnosis not present

## 2021-05-10 DIAGNOSIS — Z6841 Body Mass Index (BMI) 40.0 and over, adult: Secondary | ICD-10-CM | POA: Diagnosis not present

## 2021-05-10 DIAGNOSIS — E669 Obesity, unspecified: Secondary | ICD-10-CM | POA: Diagnosis not present

## 2021-05-10 DIAGNOSIS — I70248 Atherosclerosis of native arteries of left leg with ulceration of other part of lower left leg: Secondary | ICD-10-CM | POA: Diagnosis not present

## 2021-05-10 DIAGNOSIS — I10 Essential (primary) hypertension: Secondary | ICD-10-CM | POA: Diagnosis not present

## 2021-05-10 DIAGNOSIS — L97828 Non-pressure chronic ulcer of other part of left lower leg with other specified severity: Secondary | ICD-10-CM | POA: Diagnosis not present

## 2021-05-10 DIAGNOSIS — I872 Venous insufficiency (chronic) (peripheral): Secondary | ICD-10-CM | POA: Diagnosis not present

## 2021-05-11 NOTE — Progress Notes (Signed)
ERAN, WINDISH ([id number]) ?Visit Report for 05/10/2021 ?Chief Complaint Document Details ?Patient Name: Date of Service: ?[name], [name]. 05/10/2021 10:00 A M ?Medical Record Number: [id number] ?Patient Account Number: [id number] ?Date of Birth/Sex: Treating RN: ?Aug 05, 1955 (66 y.o. [name], [name] ?Primary Care Provider: [name] Other Clinician: ?Referring Provider: ?Treating Provider/Extender: [name] ?[name] ?Weeks in Treatment: 11 ?Information Obtained from: Patient ?Chief Complaint ?04/19/2021: The patient is here for ongoing follow-up regarding 2 left lower extremity wounds. ?Electronic Signature(s) ?Signed: 05/10/2021 10:40:26 AM By: [name] MD FACS ?Entered By: [name] on 05/10/2021 10:40:26 ?-------------------------------------------------------------------------------- ?Debridement Details ?Patient Name: Date of Service: ?[name], [name]. 05/10/2021 10:00 A M ?Medical Record Number: [id number] ?Patient Account Number: [id number] ?Date of Birth/Sex: Treating RN: ?02-24-1955 (66 y.o. [name], [name] ?Primary Care Provider: [name] Other Clinician: ?Referring Provider: ?Treating Provider/Extender: [name] ?[name] ?Weeks in Treatment: 11 ?Debridement Performed for Assessment: Wound #3 Left,Lateral Lower Leg ?Performed By: Physician [name], MD ?Debridement Type: Debridement ?Severity of Tissue Pre Debridement: Fat layer exposed ?Level of Consciousness (Pre-procedure): Awake and Alert ?Pre-procedure Verification/Time Out Yes - 10:34 ?Taken: ?Start Time: 10:34 ?T Area Debrided (L x W): ?otal 2.5 (cm) x 2.7 (cm) = 6.75 (cm?) ?Tissue and other material debrided: Non-Viable, [place], [place] ?Level: Non-Viable Tissue ?Debridement Description: Selective/Open Wound ?Instrument: Curette ?Bleeding: Minimum ?Hemostasis Achieved: Pressure ?End Time: 10:36 ?Procedural Pain: 0 ?Post Procedural Pain: 0 ?Response to Treatment: Procedure was  tolerated well ?Level of Consciousness (Post- Responds to Painful Stimuli ?procedure): ?Post Debridement Measurements of Total Wound ?Length: (cm) 2.5 ?Width: (cm) 2.7 ?Depth: (cm) 0.3 ?Volume: (cm?) 1.59 ?Character of Wound/Ulcer Post Debridement: Requires Further Debridement ?Severity of Tissue Post Debridement: Fat layer exposed ?Post Procedure Diagnosis ?Same as Pre-procedure ?Electronic Signature(s) ?Signed: 05/10/2021 12:03:30 PM By: [name] MD FACS ?Signed: 05/11/2021 5:30:36 PM By: [name] RN, BSN ?Entered By: [name] on 05/10/2021 10:36:30 ?-------------------------------------------------------------------------------- ?HPI Details ?Patient Name: Date of Service: ?[name], [name]. 05/10/2021 10:00 A M ?Medical Record Number: [id number] ?Patient Account Number: [id number] ?Date of Birth/Sex: Treating RN: ?August 08, 1955 (66 y.o. 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 She tells [name] that she fell on the gravel driveway in July. She developed injuries on ?her distal lower leg which have not healed. She saw her primary physician on 11/15/2017 who noted her left shin injuries. Gave her antibiotics. At that point the ?wounds were almost circumferential however most were less than 1.5 cm. Weeping edema fluid was noted. She was referred here for evaluation. The patient ?has a history of chronic lower extremity edema. 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 She has had prior history with leg weeping fluid. She does not wear compression stockings. She is not doing ?anything specific to these wound  areas. ?The patient has a history of obesity, arthritis, peripheral vascular disease hypertension lower extremity edema and Schamberg's disease ?ABI in our clinic was 1.3 on the left ?12/17/2017; patient readmitted to the clinic last week. 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 She also has ?lymphedema. Put her in 3 layer compression and silver alginate last week. She has 3 small wounds  with depth just lateral to the tibia. More problematically than ?this she has numerous shallow areas some of which are almost canal like in shape with tightly adherent painful debris. It would be very difficult and time- ?consuming to go through this and attempt to individually debride all these areas.. I changed her to collagen today to see if that would help with any of the ?surface debris on some of these wounds. Otherwise we will not be able to put this in compression we have to have someone change the dressing. The patient ?is not eligible for home health ?12/25/17 on evaluation today patient actually appears to be doing rather well in regard to the ulcer on her lower extremity. Fortunately there does not appear to ?be evidence of infection at this time. She has been tolerating the dressing changes without complication. This includes the compression wrap. The only issue ?she had was that the wrap was initially placed over her bunion region which actually calls her some discomfort and pain. Other than that things seem to be ?going rather well. ?01/01/2018 Seen today for follow-up and management of left lower extremity wound and lymphedema. T oday she presents with a new wound towards to the left ?lateral LE. Recently treated with a 7 day course of amoxicillin; reason for antibiotic dose is unknown at this time. Tolerating current treatment of collagen with 4- ?layer wraps. She obtained a venous reflux study on 12/25/17. Studies show on the right abnormal reflux times of the  popliteal vein, great saphenous vein at ?the saphenofemoral junction at the proximal thigh, great saphenous vein at the mid calf, and origin of the small saphenous vein.No superficial thrombosis. No ?deep vein thrombosis in the common femoral, femoral,and popliteal veins. Left abnormal reflex times as well of the common femoral vein, popliteal vein, and ?a great saphenous vein at the saphenofemoral junction, In great saphenous vein at the mid thigh w/o thrombosis. Has any issues or concerns during visit ?today. Recommended follow-up to vascular specialist due to abnormalities from the venous reflux study. Denies fever, pain, chills, dizziness, nausea, or ?vomiting. ?01/08/18 upon evaluation today the patient actually seems to be showing some signs of improvement in my opinion at this point in regard to the lower ?extremity ulcerated areas. She still has a lot of drainage but fortunately nothing that appears to be too significant currently. I have been very happy with the ?overall progress I see today compared to where things were during the last evaluation that I had with her. Nonetheless she has not had her appointment with ?the vein specialist as of yet in fact we were able to get this approved for her today and confirmed with them she will be seeing them on December 26. ?Nonetheless in general I do feel like the compression wraps is doing well for her. ?01/17/18; quite a bit of improvement since last time I saw this patient she has a small open area remaining on the left lateral calf and even smaller area ?medially. She has lymphedema chronic stasis changes with distal skin fibrosis. She has an appointment with vascular surgery later this month ?01/24/2018; the patient's medial leg has closed. Still a small open area on the left lateral leg. She states that the 4 layer compression we put on last week was ?too tight and she had to take it off over a few days ago. We have had resultant increase in her lymphedema in  the dorsal foot and a proximal calf. Fortunately ?that does not seem to have  resulted in any deterioration in her wounds ?The patient is going to need compression stockings. We have given her measurements to phone elast

## 2021-05-11 NOTE — Progress Notes (Signed)
Erin, [name] ([id number]) ?Visit Report for 05/10/2021 ?Arrival Information Details ?Patient Name: Date of Service: ?[name], [name]. 05/10/2021 10:00 A M ?Medical Record Number: [id number] ?Patient Account Number: [id number] ?Date of Birth/Sex: Treating RN: ?12/16/1955 (66 y.o. [name], [name] ?Primary Care Lei Dower: [name] Other Clinician: ?Referring Becka Lagasse: ?Treating Shanyce Daris/Extender: [name] ?[name] ?Weeks in Treatment: 11 ?Visit Information History Since Last Visit ?Added or deleted any medications: No ?Patient Arrived: [name] ?Any new allergies or adverse reactions: No ?Arrival Time: 10:12 ?Had a fall or experienced change in No ?Accompanied By: alone ?activities of daily living that may affect ?Transfer Assistance: None ?risk of falls: ?Patient Identification Verified: Yes ?Signs or symptoms of abuse/neglect since last visito No ?Secondary Verification Process Completed: Yes ?Hospitalized since last visit: No ?Patient Requires Transmission-Based Precautions: No ?Implantable device outside of the clinic excluding No ?Patient Has Alerts: No ?cellular tissue based products placed in the center ?since last visit: ?Has Dressing in Place as Prescribed: Yes ?Has Compression in Place as Prescribed: Yes ?Pain Present Now: No ?Electronic Signature(s) ?Signed: 05/11/2021 5:30:36 PM By: [name] RN, BSN ?Entered By: [name] on 05/10/2021 10:17:41 ?-------------------------------------------------------------------------------- ?Compression Therapy Details ?Patient Name: Date of Service: ?Erin, DURFEY D. 05/10/2021 10:00 A M ?Medical Record Number: [id number] ?Patient Account Number: [id number] ?Date of Birth/Sex: Treating RN: ?07/17/1955 (66 y.o. [name], [name] ?Primary Care Eliese Kerwood: [name] Other Clinician: ?Referring Lelaina Oatis: ?Treating Lj Miyamoto/Extender: [name] ?[name] ?Weeks in Treatment: 11 ?Compression Therapy Performed for Wound Assessment:  Wound #3 Left,Lateral Lower Leg ?Performed By: Clinician [name], RN ?Compression Type: Double Layer ?Post Procedure Diagnosis ?Same as Pre-procedure ?Electronic Signature(s) ?Signed: 05/11/2021 5:30:36 PM By: [name] RN, BSN ?Entered By: [name] on 05/10/2021 10:36:47 ?-------------------------------------------------------------------------------- ?Encounter Discharge Information Details ?Patient Name: ?Date of Service: ?[name], [name]. 05/10/2021 10:00 A M ?Medical Record Number: [id number] ?Patient Account Number: [id number] ?Date of Birth/Sex: ?Treating RN: ?1955-05-24 (66 y.o. [name], [name] ?Primary Care [name]: [name] ?Other Clinician: ?Referring Don Giarrusso: ?Treating Ezel Vallone/Extender: [name] ?[name] ?Weeks in Treatment: 11 ?Encounter Discharge Information Items Post Procedure Vitals ?Discharge Condition: Stable ?Temperature (F): 97.8 ?Ambulatory Status: [name] ?Pulse (bpm): 97 ?Discharge Destination: Home ?Respiratory Rate (breaths/min): 16 ?Transportation: Private Auto ?Blood Pressure (mmHg): 151/82 ?Accompanied By: alone ?Schedule Follow-up Appointment: Yes ?Clinical Summary of Care: Patient Declined ?Electronic Signature(s) ?Signed: 05/11/2021 5:30:36 PM By: [name] RN, BSN ?Entered By: [name] on 05/10/2021 13:12:14 ?-------------------------------------------------------------------------------- ?Lower Extremity Assessment Details ?Patient Name: ?Date of Service: ?[name], [name]. 05/10/2021 10:00 A M ?Medical Record Number: [id number] ?Patient Account Number: [id number] ?Date of Birth/Sex: ?Treating RN: ?[birth year] (65 y.o. [name], [name] ?Primary Care [name]: [name] ?Other Clinician: ?Referring Estiben Mizuno: ?Treating Cyris Maalouf/Extender: [name] ?[name] ?Weeks in Treatment: 11 ?Edema Assessment ?Assessed: [Left: No] [Right: No] ?Edema: [Left: Ye] [Right: s] ?Calf ?Left: Right: ?Point of Measurement: 29 cm From Medial  Instep 45 cm ?Ankle ?Left: Right: ?Point of Measurement: From Medial Instep 24.5 cm ?Vascular Assessment ?Pulses: ?Dorsalis Pedis ?Palpable: [Left:Yes] ?Electronic Signature(s) ?Signed: 05/11/2021 5:30:36 PM By: [name] RN, BSN ?Entered By: [name] on 05/10/2021 10:28:47 ?-------------------------------------------------------------------------------- ?Multi Wound Chart Details ?Patient Name: ?Date of Service: ?[name], [name]. 05/10/2021 10:00 A M ?Medical Record Number: [id number] ?Patient Account Number: [id number] ?Date of Birth/Sex: ?Treating RN: ?Jul 26, 1955 (66 y.o. [name], [name] ?Primary Care [name]: [name] ?Other Clinician: ?Referring Dearis Danis: ?Treating [name]/Extender: [name] ?[name] ?Weeks in Treatment: 11 ?Vital Signs ?Height(in): 62 ?Pulse(bpm): 97 ?Weight(lbs):  226 ?Blood Pressure(mmHg): 151/82 ?Body Mass Index(BMI): 41.3 ?Temperature(??F): 97.8 ?Respiratory Rate(breaths/min): 16 ?Photos: [N/A:N/A] ?Left, Lateral Lower Leg N/A N/A ?Wound Location: ?Blister N/A N/A ?Wounding Event: ?Venous Leg Ulcer N/A N/A ?Primary Etiology: ?Cataracts, Lymphedema, N/A N/A ?Comorbid History: ?Hypertension, Peripheral Venous ?Disease, Osteoarthritis ?12/13/2020 N/A N/A ?Date Acquired: ?63 N/A N/A ?Weeks of Treatment: ?Open N/A N/A ?Wound Status: ?No N/A N/A ?Wound Recurrence: ?2.5x2.7x0.3 N/A N/A ?Measurements L x W x D (cm) ?5.301 N/A N/A ?A (cm?) : ?rea ?1.59 N/A N/A ?Volume (cm?) : ?81.80% N/A N/A ?% Reduction in A rea: ?72.60% N/A N/A ?% Reduction in Volume: ?Full Thickness Without Exposed N/A N/A ?Classification: ?Support Structures ?Medium N/A N/A ?Exudate A mount: ?Serosanguineous N/A N/A ?Exudate Type: ?red, brown N/A N/A ?Exudate Color: ?Flat and Intact N/A N/A ?Wound Margin: ?Medium (34-66%) N/A N/A ?Granulation A mount: ?Red, Pink N/A N/A ?Granulation Quality: ?Medium (34-66%) N/A N/A ?Necrotic A mount: ?Fat Layer (Subcutaneous Tissue): Yes N/A N/A ?Exposed  Structures: ?Fascia: No ?Tendon: No ?Muscle: No ?Joint: No ?Bone: No ?Medium (34-66%) N/A N/A ?Epithelialization: ?Debridement - Selective/Open Wound N/A N/A ?Debridement: ?Pre-procedure Verification/Time Out 10:34 N/A N/A ?Taken: ?[facility] N/A N/A ?Tissue Debrided: ?Non-Viable Tissue N/A N/A ?Level: ?6.75 N/A N/A ?Debridement A (sq cm): ?rea ?Curette N/A N/A ?Instrument: ?Minimum N/A N/A ?Bleeding: ?Pressure N/A N/A ?Hemostasis A chieved: ?0 N/A N/A ?Procedural Pain: ?0 N/A N/A ?Post Procedural Pain: ?Procedure was tolerated well N/A N/A ?Debridement Treatment Response: ?2.5x2.7x0.3 N/A N/A ?Post Debridement Measurements L x ?W x D (cm) ?1.59 N/A N/A ?Post Debridement Volume: (cm?) ?Compression Therapy N/A N/A ?Procedures Performed: ?Debridement ?Treatment Notes ?Electronic Signature(s) ?Signed: 05/10/2021 10:40:18 AM By: [name] MD FACS ?Signed: 05/11/2021 5:30:36 PM By: [name] RN, BSN ?Entered By: [name] on 05/10/2021 10:40:17 ?-------------------------------------------------------------------------------- ?Multi-Disciplinary Care Plan Details ?Patient Name: ?Date of Service: ?[name], [name]. 05/10/2021 10:00 A M ?Medical Record Number: [id number] ?Patient Account Number: [id number] ?Date of Birth/Sex: ?Treating RN: ?1955-08-04 (66 y.o. [name], [name] ?Primary Care Chadric Kimberley: [name] ?Other Clinician: ?Referring Emaan Gary: ?Treating Lanayah Gartley/Extender: [name] ?[name] ?Weeks in Treatment: 11 ?Multidisciplinary Care Plan reviewed with physician ?Active Inactive ?Venous Leg Ulcer ?Nursing Diagnoses: ?Actual venous Insuffiency (use after diagnosis is confirmed) ?Goals: ?Patient will maintain optimal edema control ?Date Initiated: 02/21/2021 ?Target Resolution Date: 05/19/2021 ?Goal Status: Active ?Patient/caregiver will verbalize understanding of disease process and disease management ?Date Initiated: 02/21/2021 ?Date Inactivated: 03/29/2021 ?Target Resolution Date:  03/24/2021 ?Goal Status: Met ?Interventions: ?Assess peripheral edema status every visit. ?Compression as ordered ?Provide education on venous insufficiency ?Notes: ?Wound/Skin Impairment ?Nursing Diagnoses: ?Impaire

## 2021-05-17 ENCOUNTER — Encounter (HOSPITAL_BASED_OUTPATIENT_CLINIC_OR_DEPARTMENT_OTHER): Payer: 59 | Attending: General Surgery | Admitting: General Surgery

## 2021-05-17 DIAGNOSIS — L97828 Non-pressure chronic ulcer of other part of left lower leg with other specified severity: Secondary | ICD-10-CM | POA: Diagnosis not present

## 2021-05-17 DIAGNOSIS — E039 Hypothyroidism, unspecified: Secondary | ICD-10-CM | POA: Insufficient documentation

## 2021-05-17 DIAGNOSIS — L97822 Non-pressure chronic ulcer of other part of left lower leg with fat layer exposed: Secondary | ICD-10-CM | POA: Diagnosis not present

## 2021-05-17 DIAGNOSIS — I87332 Chronic venous hypertension (idiopathic) with ulcer and inflammation of left lower extremity: Secondary | ICD-10-CM | POA: Insufficient documentation

## 2021-05-17 DIAGNOSIS — M1990 Unspecified osteoarthritis, unspecified site: Secondary | ICD-10-CM | POA: Diagnosis not present

## 2021-05-17 DIAGNOSIS — I872 Venous insufficiency (chronic) (peripheral): Secondary | ICD-10-CM | POA: Diagnosis not present

## 2021-05-17 NOTE — Progress Notes (Signed)
[name], [name] ([id number]) ?Visit Report for 05/17/2021 ?Chief Complaint Document Details ?Patient Name: Date of Service: ?[name], [name] 05/17/2021 3:00 PM ?Medical Record Number: [id number] ?Patient Account Number: [id number] ?Date of Birth/Sex: Treating RN: ?02-01-1956 (66 y.o. [name], [name] ?Primary Care Provider: [name] Other Clinician: ?Referring Provider: ?Treating Provider/Extender: [name] ?[name] ?Weeks in Treatment: 12 ?Information Obtained from: Patient ?Chief Complaint ?04/19/2021: The patient is here for ongoing follow-up regarding 2 left lower extremity wounds. ?Electronic Signature(s) ?Signed: 05/17/2021 4:24:39 PM By: [name] MD FACS ?Entered By: [name] on 05/17/2021 16:24:39 ?-------------------------------------------------------------------------------- ?Debridement Details ?Patient Name: Date of Service: ?[name], [name] 05/17/2021 3:00 PM ?Medical Record Number: [id number] ?Patient Account Number: [id number] ?Date of Birth/Sex: Treating RN: ?[birth year] (66 y.o. [name], [name] ?Primary Care Provider: [name] Other Clinician: ?Referring Provider: ?Treating Provider/Extender: [name] ?[name] ?Weeks in Treatment: 12 ?Debridement Performed for Assessment: Wound #3 Left,Lateral Lower Leg ?Performed By: Physician [name], MD ?Debridement Type: Debridement ?Severity of Tissue Pre Debridement: Fat layer exposed ?Level of Consciousness (Pre-procedure): Awake and Alert ?Pre-procedure Verification/Time Out Yes - 16:08 ?Taken: ?Start Time: 16:08 ?T Area Debrided (L x W): ?otal 2.1 (cm) x 2.5 (cm) = 5.25 (cm?) ?Tissue and other material debrided: Non-Viable, [name], [name] ?Level: Non-Viable Tissue ?Debridement Description: Selective/Open Wound ?Instrument: Curette ?Bleeding: Minimum ?Hemostasis Achieved: Pressure ?End Time: 16:10 ?Procedural Pain: 0 ?Post Procedural Pain: 0 ?Response to Treatment: Procedure was tolerated  well ?Level of Consciousness (Post- Awake and Alert ?procedure): ?Post Debridement Measurements of Total Wound ?Length: (cm) 2.1 ?Width: (cm) 2.5 ?Depth: (cm) 0.3 ?Volume: (cm?) 1.237 ?Character of Wound/Ulcer Post Debridement: Improved ?Severity of Tissue Post Debridement: Fat layer exposed ?Post Procedure Diagnosis ?Same as Pre-procedure ?Electronic Signature(s) ?Signed: 05/17/2021 5:22:08 PM By: [name] MD FACS ?Signed: 05/17/2021 7:00:57 PM By: [name] RN, BSN ?Entered By: [name] on 05/17/2021 16:11:13 ?-------------------------------------------------------------------------------- ?HPI Details ?Patient Name: Date of Service: ?[name], [name] 05/17/2021 3:00 PM ?Medical Record Number: [id number] ?Patient Account Number: [id number] ?Date of Birth/Sex: Treating RN: ?[birth year] (66 y.o. 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 She tells [name] that she fell on the gravel driveway in July. She developed injuries on ?her distal lower leg which have not healed. She saw her primary physician on 11/15/2017 who noted her left shin injuries. Gave her antibiotics. At that point the ?wounds were almost circumferential however most were less than 1.5 cm. Weeping edema fluid was noted. She was referred here for evaluation. The patient ?has a history of chronic lower extremity edema. 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 She has had prior history with leg weeping fluid. She does not wear compression stockings. She is not doing ?anything specific to these wound areas. ?The patient has a history of obesity, arthritis,  peripheral vascular disease hypertension lower extremity edema and Schamberg's disease ?ABI in our clinic was 1.3 on the left ?12/17/2017; patient readmitted to the clinic last week. 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 She also has ?lymphedema. Put her in 3 layer compression and silver alginate last week. She has 3 small wounds with depth just lateral to the  tibia. More problematically than ?this she has numerous shallow areas some of which are almost canal like in shape with tightly adherent painful debris. It would be very difficult and time- ?consuming to go through this and attempt to individually debride all these areas.. I changed her to collagen today to see if that would help with any of the ?surface debris on some of these wounds. Otherwise we will not be able to put this in compression we have to have someone change the dressing. The patient ?is not eligible for home health ?12/25/17 on evaluation today patient actually appears to be doing rather well in regard to the ulcer on her lower extremity. Fortunately there does not appear to ?be evidence of infection at this time. She has been tolerating the dressing changes without complication. This includes the compression wrap. The only issue ?she had was that the wrap was initially placed over her bunion region which actually calls her some discomfort and pain. Other than that things seem to be ?going rather well. ?01/01/2018 Seen today for follow-up and management of left lower extremity wound and lymphedema. T oday she presents with a new wound towards to the left ?lateral LE. Recently treated with a 7 day course of amoxicillin; reason for antibiotic dose is unknown at this time. Tolerating current treatment of collagen with 4- ?layer wraps. She obtained a venous reflux study on 12/25/17. Studies show on the right abnormal reflux times of the popliteal vein, great saphenous vein at ?the saphenofemoral  junction at the proximal thigh, great saphenous vein at the mid calf, and origin of the small saphenous vein.No superficial thrombosis. No ?deep vein thrombosis in the common femoral, femoral,and popliteal veins. Left abnormal reflex times as well of the common femoral vein, popliteal vein, and ?a great saphenous vein at the saphenofemoral junction, In great saphenous vein at the mid thigh w/o thrombosis. Has any issues or concerns during visit ?today. Recommended follow-up to vascular specialist due to abnormalities from the venous reflux study. Denies fever, pain, chills, dizziness, nausea, or ?vomiting. ?01/08/18 upon evaluation today the patient actually seems to be showing some signs of improvement in my opinion at this point in regard to the lower ?extremity ulcerated areas. She still has a lot of drainage but fortunately nothing that appears to be too significant currently. I have been very happy with the ?overall progress I see today compared to where things were during the last evaluation that I had with her. Nonetheless she has not had her appointment with ?the vein specialist as of yet in fact we were able to get this approved for her today and confirmed with them she will be seeing them on December 26. ?Nonetheless in general I do feel like the compression wraps is doing well for her. ?01/17/18; quite a bit of improvement since last time I saw this patient she has a small open area remaining on the left lateral calf and even smaller area ?medially. She has lymphedema chronic stasis changes with distal skin fibrosis. She has an appointment with vascular surgery later this month ?01/24/2018; the patient's medial leg has closed. Still a small open area on the left lateral leg. She states that the 4 layer compression we put on last week was ?too tight and she had to take it off over a few days ago. We have had resultant increase in her lymphedema in the dorsal foot and a proximal calf. Fortunately ?that does  not seem to have resulted in any deterioration in her  wounds ?The patient is going to need compression stockings. We have given her measurements to phone elastic therapy in [place]. She has vascular surge

## 2021-05-18 NOTE — Progress Notes (Signed)
Erin, [name] ([id number]) ?Visit Report for 05/17/2021 ?Arrival Information Details ?Patient Name: Date of Service: ?[name], SACHSE 05/17/2021 3:00 PM ?Medical Record Number: [id number] ?Patient Account Number: [id number] ?Date of Birth/Sex: Treating RN: ?Feb 28, 1955 (66 y.o. [name], [name] ?Primary Care Kevyn Boquet: [name] Other Clinician: ?Referring Nochum Fenter: ?Treating Martrell Eguia/Extender: [name] ?[name] ?Weeks in Treatment: 12 ?Visit Information History Since Last Visit ?Added or deleted any medications: No ?Patient Arrived: [name] ?Any new allergies or adverse reactions: No ?Arrival Time: 15:37 ?Had a fall or experienced change in No ?Accompanied By: self ?activities of daily living that may affect ?Transfer Assistance: None ?risk of falls: ?Patient Identification Verified: Yes ?Signs or symptoms of abuse/neglect since last visito No ?Secondary Verification Process Completed: Yes ?Hospitalized since last visit: No ?Patient Requires Transmission-Based Precautions: No ?Implantable device outside of the clinic excluding No ?Patient Has Alerts: No ?cellular tissue based products placed in the center ?since last visit: ?Has Dressing in Place as Prescribed: Yes ?Pain Present Now: No ?Electronic Signature(s) ?Signed: 05/18/2021 4:07:00 PM By: Sandre Kitty ?Entered By: Sandre Kitty on 05/17/2021 15:38:01 ?-------------------------------------------------------------------------------- ?Compression Therapy Details ?Patient Name: Date of Service: ?[name], Erin 05/17/2021 3:00 PM ?Medical Record Number: [id number] ?Patient Account Number: [id number] ?Date of Birth/Sex: Treating RN: ?[birth year] (66 y.o. [name], [name] ?Primary Care Jamelle Noy: [name] Other Clinician: ?Referring Myosha Cuadras: ?Treating Storm Dulski/Extender: [name] ?[name] ?Weeks in Treatment: 12 ?Compression Therapy Performed for Wound Assessment: Wound #3 Left,Lateral Lower Leg ?Performed By: Clinician  [name], RN ?Compression Type: Double Layer ?Post Procedure Diagnosis ?Same as Pre-procedure ?Electronic Signature(s) ?Signed: 05/17/2021 7:00:57 PM By: [name] RN, BSN ?Entered By: [name] on 05/17/2021 16:12:22 ?-------------------------------------------------------------------------------- ?Encounter Discharge Information Details ?Patient Name: ?Date of Service: ?[name], Erin 05/17/2021 3:00 PM ?Medical Record Number: [id number] ?Patient Account Number: [id number] ?Date of Birth/Sex: ?Treating RN: ?1955/07/14 (66 y.o. [name], [name] ?Primary Care Bassheva Flury: [name] ?Other Clinician: ?Referring Johnathon Olden: ?Treating Jomel Whittlesey/Extender: [name] ?[name] ?Weeks in Treatment: 12 ?Encounter Discharge Information Items Post Procedure Vitals ?Discharge Condition: Stable ?Temperature (F): 97.8 ?Ambulatory Status: [name] ?Pulse (bpm): 80 ?Discharge Destination: Home ?Respiratory Rate (breaths/min): 15 ?Transportation: Private Auto ?Blood Pressure (mmHg): 134/73 ?Accompanied By: alone ?Schedule Follow-up Appointment: Yes ?Clinical Summary of Care: Patient Declined ?Electronic Signature(s) ?Signed: 05/17/2021 7:00:57 PM By: [name] RN, BSN ?Entered By: [name] on 05/17/2021 17:47:43 ?-------------------------------------------------------------------------------- ?Lower Extremity Assessment Details ?Patient Name: ?Date of Service: ?Erin, [name] 05/17/2021 3:00 PM ?Medical Record Number: [id number] ?Patient Account Number: [id number] ?Date of Birth/Sex: ?Treating RN: ?11/22/1955 (66 y.o. [name], [name] ?Primary Care Aaric Dolph: [name] ?Other Clinician: ?Referring Doylene Splinter: ?Treating Nieve Rojero/Extender: [name] ?[name] ?Weeks in Treatment: 12 ?Edema Assessment ?Assessed: [Left: No] [Right: No] ?Edema: [Left: Ye] [Right: s] ?Calf ?Left: Right: ?Point of Measurement: 29 cm From Medial Instep 45 cm ?Ankle ?Left: Right: ?Point of Measurement: From  Medial Instep 24.5 cm ?Vascular Assessment ?Pulses: ?Dorsalis Pedis ?Palpable: [Left:Yes] ?Electronic Signature(s) ?Signed: 05/17/2021 7:00:57 PM By: [name] RN, BSN ?Entered By: [name] on 05/17/2021 16:09:04 ?-------------------------------------------------------------------------------- ?Multi Wound Chart Details ?Patient Name: ?Date of Service: ?[name], Erin 05/17/2021 3:00 PM ?Medical Record Number: [id number] ?Patient Account Number: [id number] ?Date of Birth/Sex: ?Treating RN: ?1955/06/09 (66 y.o. [name], [name] ?Primary Care Zalia Hautala: [name] ?Other Clinician: ?Referring London Nonaka: ?Treating Yago Ludvigsen/Extender: [name] ?[name] ?Weeks in Treatment: 12 ?Vital Signs ?Height(in): 62 ?Pulse(bpm): 80 ?Weight(lbs): 226 ?Blood Pressure(mmHg): 134/73 ?Body Mass Index(BMI): 41.3 ?Temperature(??F): 97.8 ?Respiratory Rate(breaths/min): 15 ?Photos: [  N/A:N/A] ?Left, Lateral Lower Leg N/A N/A ?Wound Location: ?Blister N/A N/A ?Wounding Event: ?Venous Leg Ulcer N/A N/A ?Primary Etiology: ?Cataracts, Lymphedema, N/A N/A ?Comorbid History: ?Hypertension, Peripheral Venous ?Disease, Osteoarthritis ?12/13/2020 N/A N/A ?Date Acquired: ?42 N/A N/A ?Weeks of Treatment: ?Open N/A N/A ?Wound Status: ?No N/A N/A ?Wound Recurrence: ?2.1x2.5x0.3 N/A N/A ?Measurements L x W x D (cm) ?4.123 N/A N/A ?A (cm?) : ?rea ?1.237 N/A N/A ?Volume (cm?) : ?85.80% N/A N/A ?% Reduction in A rea: ?78.70% N/A N/A ?% Reduction in Volume: ?Full Thickness Without Exposed N/A N/A ?Classification: ?Support Structures ?Medium N/A N/A ?Exudate A mount: ?Serosanguineous N/A N/A ?Exudate Type: ?red, brown N/A N/A ?Exudate Color: ?Flat and Intact N/A N/A ?Wound Margin: ?Medium (34-66%) N/A N/A ?Granulation A mount: ?Red, Pink N/A N/A ?Granulation Quality: ?Medium (34-66%) N/A N/A ?Necrotic A mount: ?Fat Layer (Subcutaneous Tissue): Yes N/A N/A ?Exposed Structures: ?Fascia: No ?Tendon: No ?Muscle: No ?Joint: No ?Bone:  No ?Medium (34-66%) N/A N/A ?Epithelialization: ?Debridement - Selective/Open Wound N/A N/A ?Debridement: ?Pre-procedure Verification/Time Out 16:08 N/A N/A ?Taken: ?[facility] N/A N/A ?Tissue Debrided: ?Non-Viable Tissue N/A N/A ?Level: ?5.25 N/A N/A ?Debridement A (sq cm): ?rea ?Curette N/A N/A ?Instrument: ?Minimum N/A N/A ?Bleeding: ?Pressure N/A N/A ?Hemostasis A chieved: ?0 N/A N/A ?Procedural Pain: ?0 N/A N/A ?Post Procedural Pain: ?Procedure was tolerated well N/A N/A ?Debridement Treatment Response: ?2.1x2.5x0.3 N/A N/A ?Post Debridement Measurements L x ?W x D (cm) ?1.237 N/A N/A ?Post Debridement Volume: (cm?) ?Compression Therapy N/A N/A ?Procedures Performed: ?Debridement ?Treatment Notes ?Electronic Signature(s) ?Signed: 05/17/2021 4:23:16 PM By: [name] MD FACS ?Signed: 05/17/2021 7:00:57 PM By: [name] RN, BSN ?Signed: 05/17/2021 7:00:57 PM By: [name] RN, BSN ?Entered By: [name] on 05/17/2021 16:23:16 ?-------------------------------------------------------------------------------- ?Multi-Disciplinary Care Plan Details ?Patient Name: ?Date of Service: ?YARISA, LYNAM 05/17/2021 3:00 PM ?Medical Record Number: [id number] ?Patient Account Number: [id number] ?Date of Birth/Sex: ?Treating RN: ?[birth year] (66 y.o. [name], [name] ?Primary Care Torsten Weniger: [name] ?Other Clinician: ?Referring Elyshia Kumagai: ?Treating Cashius Grandstaff/Extender: [name] ?[name] ?Weeks in Treatment: 12 ?Multidisciplinary Care Plan reviewed with physician ?Active Inactive ?Venous Leg Ulcer ?Nursing Diagnoses: ?Actual venous Insuffiency (use after diagnosis is confirmed) ?Goals: ?Patient will maintain optimal edema control ?Date Initiated: 02/21/2021 ?Target Resolution Date: 06/16/2021 ?Goal Status: Active ?Patient/caregiver will verbalize understanding of disease process and disease management ?Date Initiated: 02/21/2021 ?Date Inactivated: 03/29/2021 ?Target Resolution Date: 03/24/2021 ?Goal  Status: Met ?Interventions: ?Assess peripheral edema status every visit. ?Compression as ordered ?Provide education on venous insufficiency ?Notes: ?Wound/Skin Impairment ?Nursing Diagnoses: ?Impaired tissue integrity

## 2021-05-24 ENCOUNTER — Other Ambulatory Visit (HOSPITAL_COMMUNITY): Payer: Self-pay

## 2021-05-24 ENCOUNTER — Encounter (HOSPITAL_BASED_OUTPATIENT_CLINIC_OR_DEPARTMENT_OTHER): Payer: 59 | Admitting: General Surgery

## 2021-05-24 ENCOUNTER — Other Ambulatory Visit: Payer: Self-pay | Admitting: Internal Medicine

## 2021-05-24 DIAGNOSIS — E039 Hypothyroidism, unspecified: Secondary | ICD-10-CM | POA: Diagnosis not present

## 2021-05-24 DIAGNOSIS — I87332 Chronic venous hypertension (idiopathic) with ulcer and inflammation of left lower extremity: Secondary | ICD-10-CM | POA: Diagnosis not present

## 2021-05-24 DIAGNOSIS — L97822 Non-pressure chronic ulcer of other part of left lower leg with fat layer exposed: Secondary | ICD-10-CM | POA: Diagnosis not present

## 2021-05-24 DIAGNOSIS — M1990 Unspecified osteoarthritis, unspecified site: Secondary | ICD-10-CM | POA: Diagnosis not present

## 2021-05-24 DIAGNOSIS — L97828 Non-pressure chronic ulcer of other part of left lower leg with other specified severity: Secondary | ICD-10-CM | POA: Diagnosis not present

## 2021-05-24 DIAGNOSIS — I872 Venous insufficiency (chronic) (peripheral): Secondary | ICD-10-CM | POA: Diagnosis not present

## 2021-05-24 MED ORDER — LISINOPRIL 20 MG PO TABS
20.0000 mg | ORAL_TABLET | Freq: Every day | ORAL | 0 refills | Status: DC
  Filled 2021-05-24: qty 90, 90d supply, fill #0

## 2021-05-24 NOTE — Progress Notes (Signed)
ABBIGAIL, ANSTEY ([id number]) ?Visit Report for 05/24/2021 ?Arrival Information Details ?Patient Name: Date of Service: ?AEISHA, MINARIK D. 05/24/2021 11:00 A M ?Medical Record Number: [id number] ?Patient Account Number: [id number] ?Date of Birth/Sex: Treating RN: ?1955/12/19 (66 y.o. F) [name], [name] ?Primary Care Kandis Henry: [name] Other Clinician: ?Referring Tanita Palinkas: ?Treating Jakeria Caissie/Extender: [name] ?[name] ?Weeks in Treatment: 13 ?Visit Information History Since Last Visit ?Added or deleted any medications: No ?Patient Arrived: [name] ?Any new allergies or adverse reactions: No ?Arrival Time: 10:57 ?Had a fall or experienced change in No ?Accompanied By: self ?activities of daily living that may affect ?Transfer Assistance: None ?risk of falls: ?Patient Identification Verified: Yes ?Signs or symptoms of abuse/neglect since last visito No ?Patient Requires Transmission-Based Precautions: No ?Hospitalized since last visit: No ?Patient Has Alerts: No ?Implantable device outside of the clinic excluding No ?cellular tissue based products placed in the center ?since last visit: ?Has Dressing in Place as Prescribed: Yes ?Has Compression in Place as Prescribed: Yes ?Pain Present Now: No ?Electronic Signature(s) ?Signed: 05/24/2021 5:52:17 PM By: [name] RN ?Entered By: [name] on 05/24/2021 10:58:32 ?-------------------------------------------------------------------------------- ?Encounter Discharge Information Details ?Patient Name: Date of Service: ?TILLEY, FAETH D. 05/24/2021 11:00 A M ?Medical Record Number: [id number] ?Patient Account Number: [id number] ?Date of Birth/Sex: Treating RN: ?03-23-1955 (66 y.o. F) Boehlein, Linda ?Primary Care Tiwanda Threats: [name] Other Clinician: ?Referring Janece Laidlaw: ?Treating Gabor Lusk/Extender: [name] ?[name] ?Weeks in Treatment: 13 ?Encounter Discharge Information Items Post Procedure Vitals ?Discharge Condition:  Stable ?Temperature (F): 97.8 ?Ambulatory Status: [name] ?Pulse (bpm): 94 ?Discharge Destination: Home ?Respiratory Rate (breaths/min): 18 ?Transportation: Private Auto ?Blood Pressure (mmHg): 144/84 ?Accompanied By: self ?Schedule Follow-up Appointment: Yes ?Clinical Summary of Care: Patient Declined ?Electronic Signature(s) ?Signed: 05/24/2021 6:08:52 PM By: Baruch Gouty RN, BSN ?Entered By: Baruch Gouty on 05/24/2021 12:01:53 ?-------------------------------------------------------------------------------- ?Lower Extremity Assessment Details ?Patient Name: ?Date of Service: ?LAHARI, SUTTLES D. 05/24/2021 11:00 A M ?Medical Record Number: [id number] ?Patient Account Number: [id number] ?Date of Birth/Sex: ?Treating RN: ?1955/08/17 (66 y.o. F) [name], [name] ?Primary Care Rashid Whitenight: [name] ?Other Clinician: ?Referring Amariah Kierstead: ?Treating Rakisha Pincock/Extender: [name] ?[name] ?Weeks in Treatment: 13 ?Edema Assessment ?Assessed: [Left: No] [Right: No] ?Edema: [Left: Ye] [Right: s] ?Calf ?Left: Right: ?Point of Measurement: 29 cm From Medial Instep 42 cm ?Ankle ?Left: Right: ?Point of Measurement: From Medial Instep 24 cm ?Vascular Assessment ?Pulses: ?Dorsalis Pedis ?Palpable: [Left:Yes] ?Electronic Signature(s) ?Signed: 05/24/2021 5:52:17 PM By: [name] RN ?Entered By: [name] on 05/24/2021 11:05:15 ?-------------------------------------------------------------------------------- ?Multi Wound Chart Details ?Patient Name: ?Date of Service: ?LOLETA, FROMMELT D. 05/24/2021 11:00 A M ?Medical Record Number: [id number] ?Patient Account Number: [id number] ?Date of Birth/Sex: ?Treating RN: ?[birth year] (66 y.o. F) ?Primary Care Labron Bloodgood: [name] ?Other Clinician: ?Referring Banita Lehn: ?Treating Zinnia Tindall/Extender: [name] ?[name] ?Weeks in Treatment: 13 ?Vital Signs ?Height(in): 62 ?Pulse(bpm): 94 ?Weight(lbs): 226 ?Blood Pressure(mmHg): 144/84 ?Body Mass Index(BMI):  41.3 ?Temperature(??F): 97.8 ?Respiratory Rate(breaths/min): 16 ?Photos: [N/A:N/A] ?Left, Lateral Lower Leg N/A N/A ?Wound Location: ?Blister N/A N/A ?Wounding Event: ?Venous Leg Ulcer N/A N/A ?Primary Etiology: ?Cataracts, Lymphedema, N/A N/A ?Comorbid History: ?Hypertension, Peripheral Venous ?Disease, Osteoarthritis ?12/13/2020 N/A N/A ?Date Acquired: ?73 N/A N/A ?Weeks of Treatment: ?Open N/A N/A ?Wound Status: ?No N/A N/A ?Wound Recurrence: ?2x1.7x0.2 N/A N/A ?Measurements L x W x D (cm) ?2.67 N/A N/A ?A (cm?) : ?rea ?0.534 N/A N/A ?Volume (cm?) : ?90.80% N/A N/A ?% Reduction in A rea: ?90.80% N/A N/A ?% Reduction in Volume: ?Full Thickness Without Exposed  N/A N/A ?Classification: ?Support Structures ?Medium N/A N/A ?Exudate A mount: ?Serosanguineous N/A N/A ?Exudate Type: ?red, brown N/A N/A ?Exudate Color: ?Flat and Intact N/A N/A ?Wound Margin: ?Medium (34-66%) N/A N/A ?Granulation A mount: ?Red, Pink N/A N/A ?Granulation Quality: ?Medium (34-66%) N/A N/A ?Necrotic A mount: ?Fat Layer (Subcutaneous Tissue): Yes N/A N/A ?Exposed Structures: ?Fascia: No ?Tendon: No ?Muscle: No ?Joint: No ?Bone: No ?Medium (34-66%) N/A N/A ?Epithelialization: ?Debridement - Excisional N/A N/A ?Debridement: ?Pre-procedure Verification/Time Out 11:05 N/A N/A ?Taken: ?Other N/A N/A ?Pain Control: ?Necrotic/Eschar, Subcutaneous, N/A N/A ?Tissue Debrided: ?Slough ?Skin/Subcutaneous Tissue N/A N/A ?Level: ?3.4 N/A N/A ?Debridement A (sq cm): ?rea ?Curette N/A N/A ?Instrument: ?Minimum N/A N/A ?Bleeding: ?Pressure N/A N/A ?Hemostasis Achieved: ?0 N/A N/A ?Procedural Pain: ?0 N/A N/A ?Post Procedural Pain: ?Debridement Treatment Response: Procedure was tolerated well N/A N/A ?Post Debridement Measurements L x 2x2.7x0.1 N/A N/A ?W x D (cm) ?0.424 N/A N/A ?Post Debridement Volume: (cm?) ?Debridement N/A N/A ?Procedures Performed: ?Treatment Notes ?Electronic Signature(s) ?Signed: 05/24/2021 11:59:22 AM By: [name] MD FACS ?Entered  By: [name] on 05/24/2021 11:59:21 ?-------------------------------------------------------------------------------- ?Multi-Disciplinary Care Plan Details ?Patient Name: ?Date of Service: ?JOSELIN, CRANDELL D. 05/24/2021 11:00 A M ?Medical Record Number: [id number] ?Patient Account Number: [id number] ?Date of Birth/Sex: ?Treating RN: ?11-25-1955 (65 y.o. F) [name], [name] ?Primary Care Amiah Frohlich: [name] ?Other Clinician: ?Referring Pahola Dimmitt: ?Treating Asaiah Scarber/Extender: [name] ?[name] ?Weeks in Treatment: 13 ?Multidisciplinary Care Plan reviewed with physician ?Active Inactive ?Venous Leg Ulcer ?Nursing Diagnoses: ?Actual venous Insuffiency (use after diagnosis is confirmed) ?Goals: ?Patient will maintain optimal edema control ?Date Initiated: 02/21/2021 ?Target Resolution Date: 06/16/2021 ?Goal Status: Active ?Patient/caregiver will verbalize understanding of disease process and disease management ?Date Initiated: 02/21/2021 ?Date Inactivated: 03/29/2021 ?Target Resolution Date: 03/24/2021 ?Goal Status: Met ?Interventions: ?Assess peripheral edema status every visit. ?Compression as ordered ?Provide education on venous insufficiency ?Notes: ?Wound/Skin Impairment ?Nursing Diagnoses: ?Impaired tissue integrity ?Knowledge deficit related to ulceration/compromised skin integrity ?Goals: ?Patient/caregiver will verbalize understanding of skin care regimen ?Date Initiated: 02/21/2021 ?Target Resolution Date: 06/16/2021 ?Goal Status: Active ?Ulcer/skin breakdown will have a volume reduction of 30% by week 4 ?Date Initiated: 02/21/2021 ?Date Inactivated: 03/29/2021 ?Target Resolution Date: 03/24/2021 ?Goal Status: Unmet ?Unmet Reason: infection ?Ulcer/skin breakdown will have a volume reduction of 50% by week 8 ?Date Initiated: 03/29/2021 ?Date Inactivated: 04/19/2021 ?Target Resolution Date: 04/21/2021 ?Goal Status: Met ?Interventions: ?Assess patient/caregiver ability to obtain necessary supplies ?Assess  patient/caregiver ability to perform ulcer/skin care regimen upon admission and as needed ?Assess ulceration(s) every visit ?Provide education on ulcer and skin care ?Notes: ?Electronic Signature(s) ?Signed: 4/12

## 2021-05-24 NOTE — Progress Notes (Signed)
[name], HOLLIFIELD ([id number]) ?Visit Report for 05/24/2021 ?Chief Complaint Document Details ?Patient Name: Date of Service: ?[name], SOBIECH D. 05/24/2021 11:00 A M ?Medical Record Number: [id number] ?Patient Account Number: [id number] ?Date of Birth/Sex: Treating RN: ?08-05-1955 (66 y.o. F) ?Primary Care Provider: [name] Other Clinician: ?Referring Provider: ?Treating Provider/Extender: [name] ?[name] ?Weeks in Treatment: 13 ?Information Obtained from: Patient ?Chief Complaint ?04/19/2021: The patient is here for ongoing follow-up regarding 2 left lower extremity wounds. ?Electronic Signature(s) ?Signed: 05/24/2021 11:59:35 AM By: [name] MD FACS ?Entered By: [name] on 05/24/2021 11:59:34 ?-------------------------------------------------------------------------------- ?Debridement Details ?Patient Name: Date of Service: ?[name], FELIZ D. 05/24/2021 11:00 A M ?Medical Record Number: [id number] ?Patient Account Number: [id number] ?Date of Birth/Sex: Treating RN: ?01-03-1956 (66 y.o. F) Boehlein, Linda ?Primary Care Provider: [name] Other Clinician: ?Referring Provider: ?Treating Provider/Extender: [name] ?[name] ?Weeks in Treatment: 13 ?Debridement Performed for Assessment: Wound #3 Left,Lateral Lower Leg ?Performed By: Physician [name], MD ?Debridement Type: Debridement ?Severity of Tissue Pre Debridement: Fat layer exposed ?Level of Consciousness (Pre-procedure): Awake and Alert ?Pre-procedure Verification/Time Out Yes - 11:05 ?Taken: ?Start Time: 11:45 ?Pain Control: ?Other : benzocaine 20% spray ?T Area Debrided (L x W): ?otal 2 (cm) x 1.7 (cm) = 3.4 (cm?) ?Tissue and other material debrided: Viable, Non-Viable, Eschar, Slough, Subcutaneous, Slough ?Level: Skin/Subcutaneous Tissue ?Debridement Description: Excisional ?Instrument: Curette ?Bleeding: Minimum ?Hemostasis Achieved: Pressure ?Procedural Pain: 0 ?Post Procedural Pain:  0 ?Response to Treatment: Procedure was tolerated well ?Level of Consciousness (Post- Awake and Alert ?procedure): ?Post Debridement Measurements of Total Wound ?Length: (cm) 2 ?Width: (cm) 2.7 ?Depth: (cm) 0.1 ?Volume: (cm?) 0.424 ?Character of Wound/Ulcer Post Debridement: Improved ?Severity of Tissue Post Debridement: Fat layer exposed ?Post Procedure Diagnosis ?Same as Pre-procedure ?Electronic Signature(s) ?Signed: 05/24/2021 2:42:57 PM By: [name] MD FACS ?Signed: 05/24/2021 6:08:52 PM By: Baruch Gouty RN, BSN ?Entered By: Baruch Gouty on 05/24/2021 11:47:37 ?-------------------------------------------------------------------------------- ?HPI Details ?Patient Name: Date of Service: ?[name], KOLEK D. 05/24/2021 11:00 A M ?Medical Record Number: [id number] ?Patient Account Number: [id number] ?Date of Birth/Sex: Treating RN: ?August 01, 1955 (66 y.o. 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 She tells [name] that she fell on the gravel driveway in July. She developed injuries on ?her distal lower leg which have not healed. She saw her primary physician on 11/15/2017 who noted her left shin injuries. Gave her antibiotics. At that point the ?wounds were almost circumferential however most were less than 1.5 cm. Weeping edema fluid was noted. She was referred here for evaluation. The patient ?has a history of chronic lower extremity edema. 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 She has had prior history with leg weeping fluid. She does not wear compression stockings. She is not doing ?anything specific to these wound areas. ?The  patient has a history of obesity, arthritis, peripheral vascular disease hypertension lower extremity edema and Schamberg's disease ?ABI in our clinic was 1.3 on the left ?12/17/2017; patient readmitted to the clinic last week. 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 She also has ?lymphedema. Put her in 3 layer compression and silver alginate last week. She has 3 small wounds with  depth just lateral to the tibia. More problematically than ?this she has numerous shallow areas some of which are almost canal like in shape with tightly adherent painful debris. It would be very difficult and time- ?consuming to go through this and attempt to individually debride all these areas.. I changed her to collagen today to see if that would help with any of the ?surface debris on some of these wounds. Otherwise we will not be able to put this in compression we have to have someone change the dressing. The patient ?is not eligible for home health ?12/25/17 on evaluation today patient actually appears to be doing rather well in regard to the ulcer on her lower extremity. Fortunately there does not appear to ?be evidence of infection at this time. She has been tolerating the dressing changes without complication. This includes the compression wrap. The only issue ?she had was that the wrap was initially placed over her bunion region which actually calls her some discomfort and pain. Other than that things seem to be ?going rather well. ?01/01/2018 Seen today for follow-up and management of left lower extremity wound and lymphedema. T oday she presents with a new wound towards to the left ?lateral LE. Recently treated with a 7 day course of amoxicillin; reason for antibiotic dose is unknown at this time. Tolerating current treatment of collagen with 4- ?layer wraps. She obtained a venous reflux study on 12/25/17. Studies show on the right abnormal reflux times of the popliteal  vein, great saphenous vein at ?the saphenofemoral junction at the proximal thigh, great saphenous vein at the mid calf, and origin of the small saphenous vein.No superficial thrombosis. No ?deep vein thrombosis in the common femoral, femoral,and popliteal veins. Left abnormal reflex times as well of the common femoral vein, popliteal vein, and ?a great saphenous vein at the saphenofemoral junction, In great saphenous vein at the mid thigh w/o thrombosis. Has any issues or concerns during visit ?today. Recommended follow-up to vascular specialist due to abnormalities from the venous reflux study. Denies fever, pain, chills, dizziness, nausea, or ?vomiting. ?01/08/18 upon evaluation today the patient actually seems to be showing some signs of improvement in my opinion at this point in regard to the lower ?extremity ulcerated areas. She still has a lot of drainage but fortunately nothing that appears to be too significant currently. I have been very happy with the ?overall progress I see today compared to where things were during the last evaluation that I had with her. Nonetheless she has not had her appointment with ?the vein specialist as of yet in fact we were able to get this approved for her today and confirmed with them she will be seeing them on December 26. ?Nonetheless in general I do feel like the compression wraps is doing well for her. ?01/17/18; quite a bit of improvement since last time I saw this patient she has a small open area remaining on the left lateral calf and even smaller area ?medially. She has lymphedema chronic stasis changes with distal skin fibrosis. She has an appointment with vascular surgery later this month ?01/24/2018; the patient's medial leg has closed. Still a small open area on the left lateral leg. She states that the 4 layer compression we put on last week was ?too tight and she had to take it off over a few days ago. We have had resultant increase in her lymphedema in the dorsal  foot and a proximal calf. Fortunately ?that does not seem to have resulted  in any deterioration in her wounds ?The patient is going to need compression stockings. We have given her measurements to phone elastic therap

## 2021-05-31 ENCOUNTER — Encounter (HOSPITAL_BASED_OUTPATIENT_CLINIC_OR_DEPARTMENT_OTHER): Payer: 59 | Admitting: General Surgery

## 2021-05-31 DIAGNOSIS — M1990 Unspecified osteoarthritis, unspecified site: Secondary | ICD-10-CM | POA: Diagnosis not present

## 2021-05-31 DIAGNOSIS — I872 Venous insufficiency (chronic) (peripheral): Secondary | ICD-10-CM | POA: Diagnosis not present

## 2021-05-31 DIAGNOSIS — E039 Hypothyroidism, unspecified: Secondary | ICD-10-CM | POA: Diagnosis not present

## 2021-05-31 DIAGNOSIS — L97822 Non-pressure chronic ulcer of other part of left lower leg with fat layer exposed: Secondary | ICD-10-CM | POA: Diagnosis not present

## 2021-05-31 DIAGNOSIS — L97828 Non-pressure chronic ulcer of other part of left lower leg with other specified severity: Secondary | ICD-10-CM | POA: Diagnosis not present

## 2021-05-31 DIAGNOSIS — I87332 Chronic venous hypertension (idiopathic) with ulcer and inflammation of left lower extremity: Secondary | ICD-10-CM | POA: Diagnosis not present

## 2021-05-31 NOTE — Progress Notes (Signed)
[name] ([id number]) ?Visit Report for 05/31/2021 ?Chief Complaint Document Details ?Patient Name: Date of Service: ?[name], [name] 05/31/2021 3:00 PM ?Medical Record Number: [id number] ?Patient Account Number: [id number] ?Date of Birth/Sex: Treating RN: ?April 09, 1955 (66 y.o. [name] ?Primary Care Provider: [name] Other Clinician: ?Referring Provider: ?Treating Provider/Extender: [name] ?[name] ?Weeks in Treatment: 14 ?Information Obtained from: Patient ?Chief Complaint ?04/19/2021: The patient is here for ongoing follow-up regarding 2 left lower extremity wounds. ?Electronic Signature(s) ?Signed: 05/31/2021 4:24:27 PM By: [name] MD FACS ?Entered By: [name] on 05/31/2021 16:24:27 ?-------------------------------------------------------------------------------- ?Debridement Details ?Patient Name: Date of Service: ?[name], [name] 05/31/2021 3:00 PM ?Medical Record Number: [id number] ?Patient Account Number: [id number] ?Date of Birth/Sex: Treating RN: ?Nov 03, 1955 (66 y.o. [name] ?Primary Care Provider: [name] Other Clinician: ?Referring Provider: ?Treating Provider/Extender: [name] ?[name] ?Weeks in Treatment: 14 ?Debridement Performed for Assessment: Wound #3 Left,Lateral Lower Leg ?Performed By: Physician [name], MD ?Debridement Type: Debridement ?Severity of Tissue Pre Debridement: Fat layer exposed ?Level of Consciousness (Pre-procedure): Awake and Alert ?Pre-procedure Verification/Time Out Yes - 16:09 ?Taken: ?Start Time: 16:09 ?T Area Debrided (L x W): ?otal 2 (cm) x 2.4 (cm) = 4.8 (cm?) ?Tissue and other material debrided: Non-Viable, [name], [name] ?Level: Non-Viable Tissue ?Debridement Description: Selective/Open Wound ?Instrument: Curette ?Bleeding: Minimum ?Hemostasis Achieved: Pressure ?End Time: 16:11 ?Procedural Pain: 0 ?Post Procedural Pain: 0 ?Response to Treatment: Procedure was tolerated  well ?Level of Consciousness (Post- Awake and Alert ?procedure): ?Post Debridement Measurements of Total Wound ?Length: (cm) 2 ?Width: (cm) 2.4 ?Depth: (cm) 0.2 ?Volume: (cm?) 0.754 ?Character of Wound/Ulcer Post Debridement: Improved ?Severity of Tissue Post Debridement: Fat layer exposed ?Post Procedure Diagnosis ?Same as Pre-procedure ?Electronic Signature(s) ?Signed: 05/31/2021 4:46:17 PM By: [name] MD FACS ?Signed: 05/31/2021 5:49:13 PM By: [name] RN, BSN ?Entered By: [name] on 05/31/2021 16:10:52 ?-------------------------------------------------------------------------------- ?HPI Details ?Patient Name: Date of Service: ?[name], [name] 05/31/2021 3:00 PM ?Medical Record Number: [id number] ?Patient Account Number: [id number] ?Date of Birth/Sex: Treating RN: ?[birth year] (66 y.o. 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 She tells [name] that she fell on the gravel driveway in July. She developed injuries on ?her distal lower leg which have not healed. She saw her primary physician on 11/15/2017 who noted her left shin injuries. Gave her antibiotics. At that point the ?wounds were almost circumferential however most were less than 1.5 cm. Weeping edema fluid was noted. She was referred here for evaluation. The patient ?has a history of chronic lower extremity edema. 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 She has had prior history with leg weeping fluid. She does not wear compression stockings. She is not doing ?anything specific to these wound areas. ?The patient has a history of obesity, arthritis,  peripheral vascular disease hypertension lower extremity edema and Schamberg's disease ?ABI in our clinic was 1.3 on the left ?12/17/2017; patient readmitted to the clinic last week. 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 She also has ?lymphedema. Put her in 3 layer compression and silver alginate last week. She has 3 small wounds with depth just lateral to the  tibia. More problematically than ?this she has numerous shallow areas some of which are almost canal like in shape with tightly adherent painful debris. It would be very difficult and time- ?consuming to go through this and attempt to individually debride all these areas.. I changed her to collagen today to see if that would help with any of the ?surface debris on some of these wounds. Otherwise we will not be able to put this in compression we have to have someone change the dressing. The patient ?is not eligible for home health ?12/25/17 on evaluation today patient actually appears to be doing rather well in regard to the ulcer on her lower extremity. Fortunately there does not appear to ?be evidence of infection at this time. She has been tolerating the dressing changes without complication. This includes the compression wrap. The only issue ?she had was that the wrap was initially placed over her bunion region which actually calls her some discomfort and pain. Other than that things seem to be ?going rather well. ?01/01/2018 Seen today for follow-up and management of left lower extremity wound and lymphedema. T oday she presents with a new wound towards to the left ?lateral LE. Recently treated with a 7 day course of amoxicillin; reason for antibiotic dose is unknown at this time. Tolerating current treatment of collagen with 4- ?layer wraps. She obtained a venous reflux study on 12/25/17. Studies show on the right abnormal reflux times of the popliteal vein, great saphenous vein at ?the saphenofemoral  junction at the proximal thigh, great saphenous vein at the mid calf, and origin of the small saphenous vein.No superficial thrombosis. No ?deep vein thrombosis in the common femoral, femoral,and popliteal veins. Left abnormal reflex times as well of the common femoral vein, popliteal vein, and ?a great saphenous vein at the saphenofemoral junction, In great saphenous vein at the mid thigh w/o thrombosis. Has any issues or concerns during visit ?today. Recommended follow-up to vascular specialist due to abnormalities from the venous reflux study. Denies fever, pain, chills, dizziness, nausea, or ?vomiting. ?01/08/18 upon evaluation today the patient actually seems to be showing some signs of improvement in my opinion at this point in regard to the lower ?extremity ulcerated areas. She still has a lot of drainage but fortunately nothing that appears to be too significant currently. I have been very happy with the ?overall progress I see today compared to where things were during the last evaluation that I had with her. Nonetheless she has not had her appointment with ?the vein specialist as of yet in fact we were able to get this approved for her today and confirmed with them she will be seeing them on December 26. ?Nonetheless in general I do feel like the compression wraps is doing well for her. ?01/17/18; quite a bit of improvement since last time I saw this patient she has a small open area remaining on the left lateral calf and even smaller area ?medially. She has lymphedema chronic stasis changes with distal skin fibrosis. She has an appointment with vascular surgery later this month ?01/24/2018; the patient's medial leg has closed. Still a small open area on the left lateral leg. She states that the 4 layer compression we put on last week was ?too tight and she had to take it off over a few days ago. We have had resultant increase in her lymphedema in the dorsal foot and a proximal calf. Fortunately ?that does  not seem to have resulted in any deterioration in her  wounds ?The patient is going to need compression stockings. We have given her measurements to phone elastic therapy in [place]. She has vascular sur

## 2021-06-01 NOTE — Progress Notes (Signed)
RAFAEL, QUESADA ([id number]) ?Visit Report for 05/31/2021 ?Arrival Information Details ?Patient Name: Date of Service: ?ADDALIE, CALLES 05/31/2021 3:00 PM ?Medical Record Number: [id number] ?Patient Account Number: [id number] ?Date of Birth/Sex: Treating RN: ?1955/12/07 (66 y.o. [name], [name] ?Primary Care Renuka Farfan: [name] Other Clinician: ?Referring Bensyn Bornemann: ?Treating Corley Kohls/Extender: [name] ?[name] ?Weeks in Treatment: 14 ?Visit Information History Since Last Visit ?Added or deleted any medications: No ?Patient Arrived: [name] ?Any new allergies or adverse reactions: No ?Arrival Time: 15:32 ?Had a fall or experienced change in No ?Accompanied By: self ?activities of daily living that may affect ?Transfer Assistance: None ?risk of falls: ?Patient Identification Verified: Yes ?Signs or symptoms of abuse/neglect since last visito No ?Secondary Verification Process Completed: Yes ?Hospitalized since last visit: No ?Patient Requires Transmission-Based Precautions: No ?Implantable device outside of the clinic excluding No ?Patient Has Alerts: No ?cellular tissue based products placed in the center ?since last visit: ?Has Dressing in Place as Prescribed: Yes ?Pain Present Now: No ?Electronic Signature(s) ?Signed: 06/01/2021 11:56:25 AM By: Sandre Kitty ?Entered By: Sandre Kitty on 05/31/2021 15:32:37 ?-------------------------------------------------------------------------------- ?Compression Therapy Details ?Patient Name: Date of Service: ?PAW, KARSTENS 05/31/2021 3:00 PM ?Medical Record Number: [id number] ?Patient Account Number: [id number] ?Date of Birth/Sex: Treating RN: ?09-07-1955 (66 y.o. [name], [name] ?Primary Care Orva Riles: [name] Other Clinician: ?Referring Dock Baccam: ?Treating Briget Shaheed/Extender: [name] ?[name] ?Weeks in Treatment: 14 ?Compression Therapy Performed for Wound Assessment: Wound #3 Left,Lateral Lower Leg ?Performed By:  Clinician [name], RN ?Compression Type: Double Layer ?Post Procedure Diagnosis ?Same as Pre-procedure ?Electronic Signature(s) ?Signed: 05/31/2021 5:49:13 PM By: [name] RN, BSN ?Entered By: [name] on 05/31/2021 16:14:30 ?-------------------------------------------------------------------------------- ?Encounter Discharge Information Details ?Patient Name: ?Date of Service: ?JALAYIAH, BIBIAN 05/31/2021 3:00 PM ?Medical Record Number: [id number] ?Patient Account Number: [id number] ?Date of Birth/Sex: ?Treating RN: ?28-Dec-1955 (66 y.o. [name], [name] ?Primary Care Greyson Peavy: [name] ?Other Clinician: ?Referring Elijio Staples: ?Treating Chemika Nightengale/Extender: [name] ?[name] ?Weeks in Treatment: 14 ?Encounter Discharge Information Items Post Procedure Vitals ?Discharge Condition: Stable ?Temperature (F): 98.1 ?Ambulatory Status: [name] ?Pulse (bpm): 87 ?Discharge Destination: Home ?Respiratory Rate (breaths/min): 16 ?Transportation: Private Auto ?Blood Pressure (mmHg): 147/78 ?Accompanied By: alone ?Schedule Follow-up Appointment: Yes ?Clinical Summary of Care: Patient Declined ?Electronic Signature(s) ?Signed: 05/31/2021 5:49:13 PM By: [name] RN, BSN ?Entered By: [name] on 05/31/2021 17:32:53 ?-------------------------------------------------------------------------------- ?Lower Extremity Assessment Details ?Patient Name: ?Date of Service: ?SHALAYNE, LEACH 05/31/2021 3:00 PM ?Medical Record Number: [id number] ?Patient Account Number: [id number] ?Date of Birth/Sex: ?Treating RN: ?1956-01-28 (66 y.o. [name], [name] ?Primary Care Anniston Nellums: [name] ?Other Clinician: ?Referring Zeven Kocak: ?Treating Dyonna Jaspers/Extender: [name] ?[name] ?Weeks in Treatment: 14 ?Edema Assessment ?Assessed: [Left: No] [Right: No] ?Edema: [Left: Ye] [Right: s] ?Calf ?Left: Right: ?Point of Measurement: 29 cm From Medial Instep 42 cm ?Ankle ?Left: Right: ?Point of  Measurement: From Medial Instep 24 cm ?Vascular Assessment ?Pulses: ?Dorsalis Pedis ?Palpable: [Left:Yes] ?Electronic Signature(s) ?Signed: 05/31/2021 5:49:13 PM By: [name] RN, BSN ?Entered By: [name] on 05/31/2021 16:08:59 ?-------------------------------------------------------------------------------- ?Multi Wound Chart Details ?Patient Name: ?Date of Service: ?LAISA, LARRICK 05/31/2021 3:00 PM ?Medical Record Number: [id number] ?Patient Account Number: [id number] ?Date of Birth/Sex: ?Treating RN: ?[birth year] (66 y.o. [name], [name] ?Primary Care Lakeena Downie: [name] ?Other Clinician: ?Referring Crystel Demarco: ?Treating Godson Pollan/Extender: [name] ?[name] ?Weeks in Treatment: 14 ?Vital Signs ?Height(in): 62 ?Pulse(bpm): 87 ?Weight(lbs): 226 ?Blood Pressure(mmHg): 147/78 ?Body Mass Index(BMI): 41.3 ?Temperature(??F): 98.1 ?Respiratory Rate(breaths/min): 16 ?Photos: [  N/A:N/A] ?Left, Lateral Lower Leg N/A N/A ?Wound Location: ?Blister N/A N/A ?Wounding Event: ?Venous Leg Ulcer N/A N/A ?Primary Etiology: ?Cataracts, Lymphedema, N/A N/A ?Comorbid History: ?Hypertension, Peripheral Venous ?Disease, Osteoarthritis ?12/13/2020 N/A N/A ?Date Acquired: ?16 N/A N/A ?Weeks of Treatment: ?Open N/A N/A ?Wound Status: ?No N/A N/A ?Wound Recurrence: ?2x2.4x0.2 N/A N/A ?Measurements L x W x D (cm) ?3.77 N/A N/A ?A (cm?) : ?rea ?0.754 N/A N/A ?Volume (cm?) : ?87.00% N/A N/A ?% Reduction in A rea: ?87.00% N/A N/A ?% Reduction in Volume: ?Full Thickness Without Exposed N/A N/A ?Classification: ?Support Structures ?Medium N/A N/A ?Exudate A mount: ?Serosanguineous N/A N/A ?Exudate Type: ?red, brown N/A N/A ?Exudate Color: ?Flat and Intact N/A N/A ?Wound Margin: ?Medium (34-66%) N/A N/A ?Granulation A mount: ?Red, Pink N/A N/A ?Granulation Quality: ?Medium (34-66%) N/A N/A ?Necrotic A mount: ?Fat Layer (Subcutaneous Tissue): Yes N/A N/A ?Exposed Structures: ?Fascia: No ?Tendon: No ?Muscle: No ?Joint:  No ?Bone: No ?Medium (34-66%) N/A N/A ?Epithelialization: ?Debridement - Selective/Open Wound N/A N/A ?Debridement: ?Pre-procedure Verification/Time Out 16:09 N/A N/A ?Taken: ?[facility] N/A N/A ?Tissue Debrided: ?Non-Viable Tissue N/A N/A ?Level: ?4.8 N/A N/A ?Debridement A (sq cm): ?rea ?Curette N/A N/A ?Instrument: ?Minimum N/A N/A ?Bleeding: ?Pressure N/A N/A ?Hemostasis A chieved: ?0 N/A N/A ?Procedural Pain: ?0 N/A N/A ?Post Procedural Pain: ?Procedure was tolerated well N/A N/A ?Debridement Treatment Response: ?2x2.4x0.2 N/A N/A ?Post Debridement Measurements L x ?W x D (cm) ?0.754 N/A N/A ?Post Debridement Volume: (cm?) ?Compression Therapy N/A N/A ?Procedures Performed: ?Debridement ?Treatment Notes ?Electronic Signature(s) ?Signed: 05/31/2021 4:24:05 PM By: [name] MD FACS ?Signed: 05/31/2021 5:49:13 PM By: [name] RN, BSN ?Signed: 05/31/2021 5:49:13 PM By: [name] RN, BSN ?Entered By: [name] on 05/31/2021 16:24:05 ?-------------------------------------------------------------------------------- ?Multi-Disciplinary Care Plan Details ?Patient Name: ?Date of Service: ?MALISA, RUGGIERO 05/31/2021 3:00 PM ?Medical Record Number: [id number] ?Patient Account Number: [id number] ?Date of Birth/Sex: ?Treating RN: ?May 26, 1955 (66 y.o. [name], [name] ?Primary Care Trevaris Pennella: [name] ?Other Clinician: ?Referring Soledad Budreau: ?Treating Avanelle Pixley/Extender: [name] ?[name] ?Weeks in Treatment: 14 ?Multidisciplinary Care Plan reviewed with physician ?Active Inactive ?Venous Leg Ulcer ?Nursing Diagnoses: ?Actual venous Insuffiency (use after diagnosis is confirmed) ?Goals: ?Patient will maintain optimal edema control ?Date Initiated: 02/21/2021 ?Target Resolution Date: 06/16/2021 ?Goal Status: Active ?Patient/caregiver will verbalize understanding of disease process and disease management ?Date Initiated: 02/21/2021 ?Date Inactivated: 03/29/2021 ?Target Resolution Date:  03/24/2021 ?Goal Status: Met ?Interventions: ?Assess peripheral edema status every visit. ?Compression as ordered ?Provide education on venous insufficiency ?Notes: ?Wound/Skin Impairment ?Nursing Diagnoses: ?Impaired tissue in

## 2021-06-07 ENCOUNTER — Encounter (HOSPITAL_BASED_OUTPATIENT_CLINIC_OR_DEPARTMENT_OTHER): Payer: 59 | Admitting: General Surgery

## 2021-06-07 DIAGNOSIS — I7389 Other specified peripheral vascular diseases: Secondary | ICD-10-CM | POA: Diagnosis not present

## 2021-06-07 DIAGNOSIS — L97828 Non-pressure chronic ulcer of other part of left lower leg with other specified severity: Secondary | ICD-10-CM | POA: Diagnosis not present

## 2021-06-07 DIAGNOSIS — L97822 Non-pressure chronic ulcer of other part of left lower leg with fat layer exposed: Secondary | ICD-10-CM | POA: Diagnosis not present

## 2021-06-07 DIAGNOSIS — M1990 Unspecified osteoarthritis, unspecified site: Secondary | ICD-10-CM | POA: Diagnosis not present

## 2021-06-07 DIAGNOSIS — E039 Hypothyroidism, unspecified: Secondary | ICD-10-CM | POA: Diagnosis not present

## 2021-06-07 DIAGNOSIS — I10 Essential (primary) hypertension: Secondary | ICD-10-CM | POA: Diagnosis not present

## 2021-06-07 DIAGNOSIS — I87332 Chronic venous hypertension (idiopathic) with ulcer and inflammation of left lower extremity: Secondary | ICD-10-CM | POA: Diagnosis not present

## 2021-06-07 DIAGNOSIS — I872 Venous insufficiency (chronic) (peripheral): Secondary | ICD-10-CM | POA: Diagnosis not present

## 2021-06-07 NOTE — Progress Notes (Signed)
REY, FORS ([id number]) ?Visit Report for 06/07/2021 ?Arrival Information Details ?Patient Name: Date of Service: ?[name], [name] 06/07/2021 12:45 PM ?Medical Record Number: [id number] ?Patient Account Number: [id number] ?Date of Birth/Sex: Treating RN: ?[birth year] (66 y.o. F) ?Primary Care Crystina Borrayo: [name] Other Clinician: ?Referring Wallie Lagrand: ?Treating Nerida Boivin/Extender: [name] ?[name] ?Weeks in Treatment: 15 ?Visit Information History Since Last Visit ?Added or deleted any medications: No ?Patient Arrived: Ambulatory ?Any new allergies or adverse reactions: No ?Arrival Time: 12:47 ?Had a fall or experienced change in No ?Accompanied By: self ?activities of daily living that may affect ?Transfer Assistance: None ?risk of falls: ?Patient Identification Verified: Yes ?Signs or symptoms of abuse/neglect since last visito No ?Secondary Verification Process Completed: Yes ?Hospitalized since last visit: No ?Patient Requires Transmission-Based Precautions: No ?Implantable device outside of the clinic excluding No ?Patient Has Alerts: No ?cellular tissue based products placed in the center ?since last visit: ?Has Dressing in Place as Prescribed: Yes ?Pain Present Now: No ?Electronic Signature(s) ?Signed: 06/07/2021 1:00:19 PM By: Sandre Kitty ?Entered By: Sandre Kitty on 06/07/2021 12:49:21 ?-------------------------------------------------------------------------------- ?Compression Therapy Details ?Patient Name: Date of Service: ?[name], [name] 06/07/2021 12:45 PM ?Medical Record Number: [id number] ?Patient Account Number: [id number] ?Date of Birth/Sex: Treating RN: ?1955-02-19 (66 y.o. [name], [name] ?Primary Care Layia Walla: [name] Other Clinician: ?Referring Mazy Culton: ?Treating Lailyn Appelbaum/Extender: [name] ?[name] ?Weeks in Treatment: 15 ?Compression Therapy Performed for Wound Assessment: Wound #3 Left,Lateral Lower Leg ?Performed By: Clinician [name], RN ?Compression Type: Double Layer ?Post Procedure Diagnosis ?Same as Pre-procedure ?Electronic Signature(s) ?Signed: 06/07/2021 5:38:47 PM By: [name] RN, BSN ?Entered By: [name] on 06/07/2021 13:13:13 ?-------------------------------------------------------------------------------- ?Encounter Discharge Information Details ?Patient Name: ?Date of Service: ?[name], [name] 06/07/2021 12:45 PM ?Medical Record Number: [id number] ?Patient Account Number: [id number] ?Date of Birth/Sex: ?Treating RN: ?September 26, 1955 (66 y.o. [name], [name] ?Primary Care Shanea Karney: [name] ?Other Clinician: ?Referring Baudelio Karnes: ?Treating Caris Cerveny/Extender: [name] ?[name] ?Weeks in Treatment: 15 ?Encounter Discharge Information Items Post Procedure Vitals ?Discharge Condition: Stable ?Temperature (F): 97.6 ?Ambulatory Status: [name] ?Pulse (bpm): 83 ?Discharge Destination: Home ?Respiratory Rate (breaths/min): 16 ?Transportation: Private Auto ?Blood Pressure (mmHg): 135/74 ?Accompanied By: alone ?Schedule Follow-up Appointment: Yes ?Clinical Summary of Care: Patient Declined ?Electronic Signature(s) ?Signed: 06/07/2021 5:38:47 PM By: [name] RN, BSN ?Entered By: [name] on 06/07/2021 17:31:58 ?-------------------------------------------------------------------------------- ?Lower Extremity Assessment Details ?Patient Name: ?Date of Service: ?[name], [name] 06/07/2021 12:45 PM ?Medical Record Number: [id number] ?Patient Account Number: [id number] ?Date of Birth/Sex: ?Treating RN: ?12-Jul-1955 (66 y.o. [name], [name] ?Primary Care Jaydrian Corpening: [name] ?Other Clinician: ?Referring Kaelin Bonelli: ?Treating Daryus Sowash/Extender: [name] ?[name] ?Weeks in Treatment: 15 ?Edema Assessment ?Assessed: [Left: No] [Right: No] ?Edema: [Left: Ye] [Right: s] ?Calf ?Left: Right: ?Point of Measurement: 29 cm From Medial Instep 42 cm ?Ankle ?Left: Right: ?Point of Measurement: From  Medial Instep 24 cm ?Vascular Assessment ?Pulses: ?Dorsalis Pedis ?Palpable: [Left:Yes] ?Electronic Signature(s) ?Signed: 06/07/2021 5:38:47 PM By: [name] RN, BSN ?Entered By: [name] on 06/07/2021 13:07:02 ?-------------------------------------------------------------------------------- ?Multi Wound Chart Details ?Patient Name: ?Date of Service: ?[name], [name] 06/07/2021 12:45 PM ?Medical Record Number: [id number] ?Patient Account Number: [id number] ?Date of Birth/Sex: ?Treating RN: ?12/14/1955 (66 y.o. F) ?Primary Care Kahliya Fraleigh: [name] ?Other Clinician: ?Referring Kendal Raffo: ?Treating Euphemia Lingerfelt/Extender: [name] ?[name] ?Weeks in Treatment: 15 ?Vital Signs ?Height(in): 62 ?Pulse(bpm): 83 ?Weight(lbs): 226 ?Blood Pressure(mmHg): 135/74 ?Body Mass Index(BMI): 41.3 ?Temperature(??F): 97.6 ?Respiratory Rate(breaths/min): 16 ?Photos: [N/A:N/A] ?Left, Lateral Lower  Leg N/A N/A ?Wound Location: ?Blister N/A N/A ?Wounding Event: ?Venous Leg Ulcer N/A N/A ?Primary Etiology: ?Cataracts, Lymphedema, N/A N/A ?Comorbid History: ?Hypertension, Peripheral Venous ?Disease, Osteoarthritis ?12/13/2020 N/A N/A ?Date Acquired: ?64 N/A N/A ?Weeks of Treatment: ?Open N/A N/A ?Wound Status: ?No N/A N/A ?Wound Recurrence: ?1.7x2.1x0.2 N/A N/A ?Measurements L x W x D (cm) ?2.804 N/A N/A ?A (cm?) : ?rea ?0.561 N/A N/A ?Volume (cm?) : ?90.40% N/A N/A ?% Reduction in A rea: ?90.30% N/A N/A ?% Reduction in Volume: ?Full Thickness Without Exposed N/A N/A ?Classification: ?Support Structures ?Medium N/A N/A ?Exudate A mount: ?Serosanguineous N/A N/A ?Exudate Type: ?red, brown N/A N/A ?Exudate Color: ?Flat and Intact N/A N/A ?Wound Margin: ?Medium (34-66%) N/A N/A ?Granulation A mount: ?Red, Pink N/A N/A ?Granulation Quality: ?Medium (34-66%) N/A N/A ?Necrotic A mount: ?Fat Layer (Subcutaneous Tissue): Yes N/A N/A ?Exposed Structures: ?Fascia: No ?Tendon: No ?Muscle: No ?Joint: No ?Bone: No ?Medium (34-66%)  N/A N/A ?Epithelialization: ?Debridement - Selective/Open Wound N/A N/A ?Debridement: ?Pre-procedure Verification/Time Out 13:11 N/A N/A ?Taken: ?[facility] N/A N/A ?Tissue Debrided: ?Non-Viable Tissue N/A N/A ?Level: ?3.57 N/A N/A ?Debridement A (sq cm): ?rea ?Curette N/A N/A ?Instrument: ?Minimum N/A N/A ?Bleeding: ?Pressure N/A N/A ?Hemostasis A chieved: ?0 N/A N/A ?Procedural Pain: ?0 N/A N/A ?Post Procedural Pain: ?Procedure was tolerated well N/A N/A ?Debridement Treatment Response: ?1.7x2.1x0.2 N/A N/A ?Post Debridement Measurements L x ?W x D (cm) ?0.561 N/A N/A ?Post Debridement Volume: (cm?) ?Compression Therapy N/A N/A ?Procedures Performed: ?Debridement ?Treatment Notes ?Electronic Signature(s) ?Signed: 06/07/2021 1:33:56 PM By: [name] MD FACS ?Entered By: [name] on 06/07/2021 13:33:56 ?-------------------------------------------------------------------------------- ?Multi-Disciplinary Care Plan Details ?Patient Name: ?Date of Service: ?[name], [name] 06/07/2021 12:45 PM ?Medical Record Number: [id number] ?Patient Account Number: [id number] ?Date of Birth/Sex: ?Treating RN: ?[birth year] (66 y.o. [name], [name] ?Primary Care Natalya Domzalski: [name] ?Other Clinician: ?Referring Avaley Coop: ?Treating Curry Seefeldt/Extender: [name] ?[name] ?Weeks in Treatment: 15 ?Multidisciplinary Care Plan reviewed with physician ?Active Inactive ?Venous Leg Ulcer ?Nursing Diagnoses: ?Actual venous Insuffiency (use after diagnosis is confirmed) ?Goals: ?Patient will maintain optimal edema control ?Date Initiated: 02/21/2021 ?Target Resolution Date: 06/16/2021 ?Goal Status: Active ?Patient/caregiver will verbalize understanding of disease process and disease management ?Date Initiated: 02/21/2021 ?Date Inactivated: 03/29/2021 ?Target Resolution Date: 03/24/2021 ?Goal Status: Met ?Interventions: ?Assess peripheral edema status every visit. ?Compression as ordered ?Provide education on venous  insufficiency ?Notes: ?Wound/Skin Impairment ?Nursing Diagnoses: ?Impaired tissue integrity ?Knowledge deficit related to ulceration/compromised skin integrity ?Goals: ?Patient/caregiver will verbalize [place]ita [place]

## 2021-06-07 NOTE — Progress Notes (Signed)
Erin, [name] ([id number]) ?Visit Report for 06/07/2021 ?Chief Complaint Document Details ?Patient Name: Date of Service: ?[name], [name] 06/07/2021 12:45 PM ?Medical Record Number: [id number] ?Patient Account Number: [id number] ?Date of Birth/Sex: Treating RN: ?01/28/1956 (66 y.o. F) ?Primary Care Provider: [name] Other Clinician: ?Referring Provider: ?Treating Provider/Extender: [name] ?[name] ?Weeks in Treatment: 15 ?Information Obtained from: Patient ?Chief Complaint ?04/19/2021: The patient is here for ongoing follow-up regarding 2 left lower extremity wounds. ?Electronic Signature(s) ?Signed: 06/07/2021 1:34:08 PM By: [name] MD FACS ?Entered By: [name] on 06/07/2021 13:34:08 ?-------------------------------------------------------------------------------- ?Debridement Details ?Patient Name: Date of Service: ?Erin, [name] 06/07/2021 12:45 PM ?Medical Record Number: [id number] ?Patient Account Number: [id number] ?Date of Birth/Sex: Treating RN: ?1955/03/26 (66 y.o. [name], [name] ?Primary Care Provider: [name] Other Clinician: ?Referring Provider: ?Treating Provider/Extender: [name] ?[name] ?Weeks in Treatment: 15 ?Debridement Performed for Assessment: Wound #3 Left,Lateral Lower Leg ?Performed By: Physician [name], MD ?Debridement Type: Debridement ?Severity of Tissue Pre Debridement: Fat layer exposed ?Level of Consciousness (Pre-procedure): Awake and Alert ?Pre-procedure Verification/Time Out Yes - 13:11 ?Taken: ?Start Time: 13:11 ?T Area Debrided (L x W): ?otal 1.7 (cm) x 2.1 (cm) = 3.57 (cm?) ?Tissue and other material debrided: Non-Viable, Carbon, [place] ?Level: Non-Viable Tissue ?Debridement Description: Selective/Open Wound ?Instrument: Curette ?Bleeding: Minimum ?Hemostasis Achieved: Pressure ?End Time: 13:13 ?Procedural Pain: 0 ?Post Procedural Pain: 0 ?Response to Treatment: Procedure was tolerated well ?Level of  Consciousness (Post- Awake and Alert ?procedure): ?Post Debridement Measurements of Total Wound ?Length: (cm) 1.7 ?Width: (cm) 2.1 ?Depth: (cm) 0.2 ?Volume: (cm?) 0.561 ?Character of Wound/Ulcer Post Debridement: Improved ?Severity of Tissue Post Debridement: Fat layer exposed ?Post Procedure Diagnosis ?Same as Pre-procedure ?Electronic Signature(s) ?Signed: 06/07/2021 5:05:56 PM By: [name] MD FACS ?Signed: 06/07/2021 5:38:47 PM By: [name] RN, BSN ?Entered By: [name] on 06/07/2021 13:12:46 ?-------------------------------------------------------------------------------- ?HPI Details ?Patient Name: Date of Service: ?[name], [name] 06/07/2021 12:45 PM ?Medical Record Number: [id number] ?Patient Account Number: [id number] ?Date of Birth/Sex: Treating RN: ?12-27-1955 (66 y.o. 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 She tells [name] that she fell on the gravel driveway in July. She developed injuries on ?her distal lower leg which have not healed. She saw her primary physician on 11/15/2017 who noted her left shin injuries. Gave her antibiotics. At that point the ?wounds were almost circumferential however most were less than 1.5 cm. Weeping edema fluid was noted. She was referred here for evaluation. The patient ?has a history of chronic lower extremity edema. 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 She has had prior history with leg weeping fluid. She does not wear compression stockings. She is not doing ?anything specific to these wound areas. ?The patient has a history of obesity, arthritis, peripheral vascular  disease hypertension lower extremity edema and Schamberg's disease ?ABI in our clinic was 1.3 on the left ?12/17/2017; patient readmitted to the clinic last week. 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 She also has ?lymphedema. Put her in 3 layer compression and silver alginate last week. She has 3 small wounds with depth just lateral to the tibia. More problematically than ?  this she has numerous shallow areas some of which are almost canal like in shape with tightly adherent painful debris. It would be very difficult and time- ?consuming to go through this and attempt to individually debride all these areas.. I changed her to collagen today to see if that would help with any of the ?surface debris on some of these wounds. Otherwise we will not be able to put this in compression we have to have someone change the dressing. The patient ?is not eligible for home health ?12/25/17 on evaluation today patient actually appears to be doing rather well in regard to the ulcer on her lower extremity. Fortunately there does not appear to ?be evidence of infection at this time. She has been tolerating the dressing changes without complication. This includes the compression wrap. The only issue ?she had was that the wrap was initially placed over her bunion region which actually calls her some discomfort and pain. Other than that things seem to be ?going rather well. ?01/01/2018 Seen today for follow-up and management of left lower extremity wound and lymphedema. T oday she presents with a new wound towards to the left ?lateral LE. Recently treated with a 7 day course of amoxicillin; reason for antibiotic dose is unknown at this time. Tolerating current treatment of collagen with 4- ?layer wraps. She obtained a venous reflux study on 12/25/17. Studies show on the right abnormal reflux times of the popliteal vein, great saphenous vein at ?the saphenofemoral junction at the  proximal thigh, great saphenous vein at the mid calf, and origin of the small saphenous vein.No superficial thrombosis. No ?deep vein thrombosis in the common femoral, femoral,and popliteal veins. Left abnormal reflex times as well of the common femoral vein, popliteal vein, and ?a great saphenous vein at the saphenofemoral junction, In great saphenous vein at the mid thigh w/o thrombosis. Has any issues or concerns during visit ?today. Recommended follow-up to vascular specialist due to abnormalities from the venous reflux study. Denies fever, pain, chills, dizziness, nausea, or ?vomiting. ?01/08/18 upon evaluation today the patient actually seems to be showing some signs of improvement in my opinion at this point in regard to the lower ?extremity ulcerated areas. She still has a lot of drainage but fortunately nothing that appears to be too significant currently. I have been very happy with the ?overall progress I see today compared to where things were during the last evaluation that I had with her. Nonetheless she has not had her appointment with ?the vein specialist as of yet in fact we were able to get this approved for her today and confirmed with them she will be seeing them on December 26. ?Nonetheless in general I do feel like the compression wraps is doing well for her. ?01/17/18; quite a bit of improvement since last time I saw this patient she has a small open area remaining on the left lateral calf and even smaller area ?medially. She has lymphedema chronic stasis changes with distal skin fibrosis. She has an appointment with vascular surgery later this month ?01/24/2018; the patient's medial leg has closed. Still a small open area on the left lateral leg. She states that the 4 layer compression we put on last week was ?too tight and she had to take it off over a few days ago. We have had resultant increase in her lymphedema in the dorsal foot and a proximal calf. Fortunately ?that does not seem to have  resulted in any deterioration in her wounds ?The patient is  going to need compression stockings. We have given her measurements to phone elastic therapy in [place]. She has vascular surgery consult ?on Decem

## 2021-06-10 ENCOUNTER — Other Ambulatory Visit: Payer: Self-pay | Admitting: Internal Medicine

## 2021-06-12 MED ORDER — SIMVASTATIN 20 MG PO TABS
20.0000 mg | ORAL_TABLET | Freq: Every day | ORAL | 0 refills | Status: DC
  Filled 2021-06-12: qty 90, 90d supply, fill #0

## 2021-06-13 ENCOUNTER — Other Ambulatory Visit (HOSPITAL_COMMUNITY): Payer: Self-pay

## 2021-06-14 ENCOUNTER — Encounter (HOSPITAL_BASED_OUTPATIENT_CLINIC_OR_DEPARTMENT_OTHER): Payer: 59 | Attending: General Surgery | Admitting: General Surgery

## 2021-06-14 DIAGNOSIS — I87332 Chronic venous hypertension (idiopathic) with ulcer and inflammation of left lower extremity: Secondary | ICD-10-CM | POA: Diagnosis not present

## 2021-06-14 DIAGNOSIS — L97828 Non-pressure chronic ulcer of other part of left lower leg with other specified severity: Secondary | ICD-10-CM | POA: Diagnosis not present

## 2021-06-14 DIAGNOSIS — L97822 Non-pressure chronic ulcer of other part of left lower leg with fat layer exposed: Secondary | ICD-10-CM | POA: Diagnosis not present

## 2021-06-14 DIAGNOSIS — L97811 Non-pressure chronic ulcer of other part of right lower leg limited to breakdown of skin: Secondary | ICD-10-CM | POA: Insufficient documentation

## 2021-06-14 DIAGNOSIS — I872 Venous insufficiency (chronic) (peripheral): Secondary | ICD-10-CM | POA: Diagnosis not present

## 2021-06-14 NOTE — Progress Notes (Signed)
ARLIENE, ROSENOW ([id number]) ?Visit Report for 06/14/2021 ?Chief Complaint Document Details ?Patient Name: Date of Service: ?JUHI, LAGRANGE 06/14/2021 3:45 PM ?Medical Record Number: [id number] ?Patient Account Number: [id number] ?Date of Birth/Sex: Treating RN: ?04-14-1955 (66 y.o. [name], [name] ?Primary Care Provider: [name] Other Clinician: ?Referring Provider: ?Treating Provider/Extender: [name] ?[name] ?Weeks in Treatment: 16 ?Information Obtained from: Patient ?Chief Complaint ?04/19/2021: The patient is here for ongoing follow-up regarding 2 left lower extremity wounds. ?Electronic Signature(s) ?Signed: 06/14/2021 5:03:59 PM By: [name] MD FACS ?Entered By: [name] on 06/14/2021 17:03:59 ?-------------------------------------------------------------------------------- ?Debridement Details ?Patient Name: Date of Service: ?MILLETTE, HALBERSTAM 06/14/2021 3:45 PM ?Medical Record Number: [id number] ?Patient Account Number: [id number] ?Date of Birth/Sex: Treating RN: ?06-21-1955 (66 y.o. [name], [name] ?Primary Care Provider: [name] Other Clinician: ?Referring Provider: ?Treating Provider/Extender: [name] ?[name] ?Weeks in Treatment: 16 ?Debridement Performed for Assessment: Wound #3 Left,Lateral Lower Leg ?Performed By: Physician [name], MD ?Debridement Type: Debridement ?Severity of Tissue Pre Debridement: Fat layer exposed ?Level of Consciousness (Pre-procedure): Awake and Alert ?Pre-procedure Verification/Time Out Yes - 16:33 ?Taken: ?Start Time: 16:33 ?T Area Debrided (L x W): ?otal 1.7 (cm) x 2.2 (cm) = 3.74 (cm?) ?Tissue and other material debrided: Non-Viable, Slough, Subcutaneous, Angelina ?Level: Skin/Subcutaneous Tissue ?Debridement Description: Excisional ?Instrument: Curette ?Bleeding: Minimum ?Hemostasis Achieved: Pressure ?End Time: 16:35 ?Procedural Pain: 0 ?Post Procedural Pain: 0 ?Response to Treatment: Procedure was  tolerated well ?Level of Consciousness (Post- Awake and Alert ?procedure): ?Post Debridement Measurements of Total Wound ?Length: (cm) 1.7 ?Width: (cm) 2.2 ?Depth: (cm) 0.2 ?Volume: (cm?) 0.587 ?Character of Wound/Ulcer Post Debridement: Improved ?Severity of Tissue Post Debridement: Fat layer exposed ?Post Procedure Diagnosis ?Same as Pre-procedure ?Electronic Signature(s) ?Signed: 06/14/2021 5:10:10 PM By: [name] MD FACS ?Signed: 06/14/2021 5:40:12 PM By: [name] RN, BSN ?Entered By: [name] on 06/14/2021 16:35:14 ?-------------------------------------------------------------------------------- ?HPI Details ?Patient Name: Date of Service: ?ARDITH, TEST 06/14/2021 3:45 PM ?Medical Record Number: [id number] ?Patient Account Number: [id number] ?Date of Birth/Sex: Treating RN: ?Dec 31, 1955 (66 y.o. 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 She tells [name] that she fell on the gravel driveway in July. She developed injuries on ?her distal lower leg which have not healed. She saw her primary physician on 11/15/2017 who noted her left shin injuries. Gave her antibiotics. At that point the ?wounds were almost circumferential however most were less than 1.5 cm. Weeping edema fluid was noted. She was referred here for evaluation. The patient ?has a history of chronic lower extremity edema. 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 She has had prior history with leg weeping fluid. She does not wear compression stockings. She is not doing ?anything specific to these wound areas. ?The patient has a history of obesity,  arthritis, peripheral vascular disease hypertension lower extremity edema and Schamberg's disease ?ABI in our clinic was 1.3 on the left ?12/17/2017; patient readmitted to the clinic last week. 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 She also has ?lymphedema. Put her in 3 layer compression and silver alginate last week. She has 3 small wounds with depth just lateral to the  tibia. More problematically than ?this she has numerous shallow areas some of which are almost canal like in shape with tightly adherent painful debris. It would be very difficult and time- ?consuming to go through this and attempt to individually debride all these areas.. I changed her to collagen today to see if that would help with any of the ?surface debris on some of these wounds. Otherwise we will not be able to put this in compression we have to have someone change the dressing. The patient ?is not eligible for home health ?12/25/17 on evaluation today patient actually appears to be doing rather well in regard to the ulcer on her lower extremity. Fortunately there does not appear to ?be evidence of infection at this time. She has been tolerating the dressing changes without complication. This includes the compression wrap. The only issue ?she had was that the wrap was initially placed over her bunion region which actually calls her some discomfort and pain. Other than that things seem to be ?going rather well. ?01/01/2018 Seen today for follow-up and management of left lower extremity wound and lymphedema. T oday she presents with a new wound towards to the left ?lateral LE. Recently treated with a 7 day course of amoxicillin; reason for antibiotic dose is unknown at this time. Tolerating current treatment of collagen with 4- ?layer wraps. She obtained a venous reflux study on 12/25/17. Studies show on the right abnormal reflux times of the popliteal vein, great saphenous vein at ?the  saphenofemoral junction at the proximal thigh, great saphenous vein at the mid calf, and origin of the small saphenous vein.No superficial thrombosis. No ?deep vein thrombosis in the common femoral, femoral,and popliteal veins. Left abnormal reflex times as well of the common femoral vein, popliteal vein, and ?a great saphenous vein at the saphenofemoral junction, In great saphenous vein at the mid thigh w/o thrombosis. Has any issues or concerns during visit ?today. Recommended follow-up to vascular specialist due to abnormalities from the venous reflux study. Denies fever, pain, chills, dizziness, nausea, or ?vomiting. ?01/08/18 upon evaluation today the patient actually seems to be showing some signs of improvement in my opinion at this point in regard to the lower ?extremity ulcerated areas. She still has a lot of drainage but fortunately nothing that appears to be too significant currently. I have been very happy with the ?overall progress I see today compared to where things were during the last evaluation that I had with her. Nonetheless she has not had her appointment with ?the vein specialist as of yet in fact we were able to get this approved for her today and confirmed with them she will be seeing them on December 26. ?Nonetheless in general I do feel like the compression wraps is doing well for her. ?01/17/18; quite a bit of improvement since last time I saw this patient she has a small open area remaining on the left lateral calf and even smaller area ?medially. She has lymphedema chronic stasis changes with distal skin fibrosis. She has an appointment with vascular surgery later this month ?01/24/2018; the patient's medial leg has closed. Still a small open area on the left lateral leg. She states that the 4 layer compression we put on last week was ?too tight and she had to take it off over a few days ago. We have had resultant increase in her lymphedema in the dorsal foot and a proximal calf.  Fortunately ?that does not seem to have resulted in any deterioration in her  wounds ?The patient is going to need compression stockings. We have given her measurements to phone elastic therapy in [place]. She has vas

## 2021-06-15 ENCOUNTER — Other Ambulatory Visit (HOSPITAL_COMMUNITY): Payer: Self-pay

## 2021-06-15 NOTE — Progress Notes (Signed)
[name], [name] ([id number]) ?Visit Report for 06/14/2021 ?Arrival Information Details ?Patient Name: Date of Service: ?[name], [name] 06/14/2021 3:45 PM ?Medical Record Number: [id number] ?Patient Account Number: [id number] ?Date of Birth/Sex: Treating RN: ?1955-03-12 (66 y.o. [name], [name] ?Primary Care Zanaya Baize: [name] Other Clinician: ?Referring Corley Maffeo: ?Treating Dejanee Thibeaux/Extender: [name] ?[name] ?Weeks in Treatment: 16 ?Visit Information History Since Last Visit ?Added or deleted any medications: No ?Patient Arrived: [name] ?Any new allergies or adverse reactions: No ?Arrival Time: 16:09 ?Had a fall or experienced change in No ?Accompanied By: self ?activities of daily living that may affect ?Transfer Assistance: None ?risk of falls: ?Patient Identification Verified: Yes ?Signs or symptoms of abuse/neglect since last visito No ?Secondary Verification Process Completed: Yes ?Hospitalized since last visit: No ?Patient Requires Transmission-Based Precautions: No ?Implantable device outside of the clinic excluding No ?Patient Has Alerts: No ?cellular tissue based products placed in the center ?since last visit: ?Has Dressing in Place as Prescribed: Yes ?Pain Present Now: No ?Electronic Signature(s) ?Signed: 06/15/2021 11:10:54 AM By: Sandre Kitty ?Entered By: Sandre Kitty on 06/14/2021 16:09:49 ?-------------------------------------------------------------------------------- ?Compression Therapy Details ?Patient Name: Date of Service: ?[name], [name] 06/14/2021 3:45 PM ?Medical Record Number: [id number] ?Patient Account Number: [id number] ?Date of Birth/Sex: Treating RN: ?01/07/1956 (66 y.o. [name], [name] ?Primary Care Janiaya Ryser: [name] Other Clinician: ?Referring Brecklyn Galvis: ?Treating Wava Kildow/Extender: [name] ?[name] ?Weeks in Treatment: 16 ?Compression Therapy Performed for Wound Assessment: Wound #3 Left,Lateral Lower Leg ?Performed By: Clinician  [name], RN ?Compression Type: Double Layer ?Post Procedure Diagnosis ?Same as Pre-procedure ?Electronic Signature(s) ?Signed: 06/14/2021 5:40:12 PM By: [name] RN, BSN ?Entered By: [name] on 06/14/2021 16:35:26 ?-------------------------------------------------------------------------------- ?Encounter Discharge Information Details ?Patient Name: ?Date of Service: ?[name], [name] 06/14/2021 3:45 PM ?Medical Record Number: [id number] ?Patient Account Number: [id number] ?Date of Birth/Sex: ?Treating RN: ?1955/07/08 (66 y.o. [name], [name] ?Primary Care Odean Fester: [name] ?Other Clinician: ?Referring Karee Forge: ?Treating Seferino Oscar/Extender: [name] ?[name] ?Weeks in Treatment: 16 ?Encounter Discharge Information Items Post Procedure Vitals ?Discharge Condition: Stable ?Temperature (F): 97.7 ?Ambulatory Status: [name] ?Pulse (bpm): 84 ?Discharge Destination: Home ?Respiratory Rate (breaths/min): 16 ?Transportation: Private Auto ?Blood Pressure (mmHg): 126/66 ?Accompanied By: alone ?Schedule Follow-up Appointment: Yes ?Clinical Summary of Care: Patient Declined ?Electronic Signature(s) ?Signed: 06/14/2021 5:40:12 PM By: [name] RN, BSN ?Entered By: [name] on 06/14/2021 17:36:37 ?-------------------------------------------------------------------------------- ?Lower Extremity Assessment Details ?Patient Name: ?Date of Service: ?[name], [name] 06/14/2021 3:45 PM ?Medical Record Number: [id number] ?Patient Account Number: [id number] ?Date of Birth/Sex: ?Treating RN: ?19-Jul-1955 (66 y.o. [name], [name] ?Primary Care Tiffannie Sloss: [name] ?Other Clinician: ?Referring Allon Costlow: ?Treating Kristyanna Barcelo/Extender: [name] ?[name] ?Weeks in Treatment: 16 ?Edema Assessment ?Assessed: [Left: No] [Right: No] ?Edema: [Left: Ye] [Right: s] ?Calf ?Left: Right: ?Point of Measurement: 29 cm From Medial Instep 42 cm ?Ankle ?Left: Right: ?Point of Measurement: From  Medial Instep 24 cm ?Vascular Assessment ?Pulses: ?Dorsalis Pedis ?Palpable: [Left:Yes] ?Electronic Signature(s) ?Signed: 06/14/2021 5:40:12 PM By: [name] RN, BSN ?Entered By: [name] on 06/14/2021 16:26:16 ?-------------------------------------------------------------------------------- ?Multi Wound Chart Details ?Patient Name: ?Date of Service: ?[name], [name] 06/14/2021 3:45 PM ?Medical Record Number: [id number] ?Patient Account Number: [id number] ?Date of Birth/Sex: ?Treating RN: ?1955-09-04 (66 y.o. [name], [name] ?Primary Care Riannon Mukherjee: [name] ?Other Clinician: ?Referring Marielouise Amey: ?Treating Roena Sassaman/Extender: [name] ?[name] ?Weeks in Treatment: 16 ?Vital Signs ?Height(in): 62 ?Pulse(bpm): 84 ?Weight(lbs): 226 ?Blood Pressure(mmHg): 126/66 ?Body Mass Index(BMI): 41.3 ?Temperature(??F): 97.7 ?Respiratory Rate(breaths/min): 16 ?Photos: [  N/A:N/A] ?Left, Lateral Lower Leg N/A N/A ?Wound Location: ?Blister N/A N/A ?Wounding Event: ?Venous Leg Ulcer N/A N/A ?Primary Etiology: ?Cataracts, Lymphedema, N/A N/A ?Comorbid History: ?Hypertension, Peripheral Venous ?Disease, Osteoarthritis ?12/13/2020 N/A N/A ?Date Acquired: ?[birth year] N/A N/A ?Weeks of Treatment: ?Open N/A N/A ?Wound Status: ?No N/A N/A ?Wound Recurrence: ?1.7x2.2x0.2 N/A N/A ?Measurements L x W x D (cm) ?2.937 N/A N/A ?A (cm?) : ?rea ?0.587 N/A N/A ?Volume (cm?) : ?89.90% N/A N/A ?% Reduction in A rea: ?89.90% N/A N/A ?% Reduction in Volume: ?Full Thickness Without Exposed N/A N/A ?Classification: ?Support Structures ?Medium N/A N/A ?Exudate A mount: ?Serosanguineous N/A N/A ?Exudate Type: ?red, brown N/A N/A ?Exudate Color: ?Flat and Intact N/A N/A ?Wound Margin: ?Small (1-33%) N/A N/A ?Granulation A mount: ?Red, Pink N/A N/A ?Granulation Quality: ?Large (67-100%) N/A N/A ?Necrotic A mount: ?Fat Layer (Subcutaneous Tissue): Yes N/A N/A ?Exposed Structures: ?Fascia: No ?Tendon: No ?Muscle: No ?Joint: No ?Bone: No ?Medium  (34-66%) N/A N/A ?Epithelialization: ?Debridement - Excisional N/A N/A ?Debridement: ?Pre-procedure Verification/Time Out 16:33 N/A N/A ?Taken: ?Subcutaneous, Slough N/A N/A ?Tissue Debrided: ?Skin/Subcutaneous Tissue N/A N/A ?Level: ?3.74 N/A N/A ?Debridement A (sq cm): ?rea ?Curette N/A N/A ?Instrument: ?Minimum N/A N/A ?Bleeding: ?Pressure N/A N/A ?Hemostasis A chieved: ?0 N/A N/A ?Procedural Pain: ?0 N/A N/A ?Post Procedural Pain: ?Procedure was tolerated well N/A N/A ?Debridement Treatment Response: ?1.7x2.2x0.2 N/A N/A ?Post Debridement Measurements L x ?W x D (cm) ?0.587 N/A N/A ?Post Debridement Volume: (cm?) ?Compression Therapy N/A N/A ?Procedures Performed: ?Debridement ?Treatment Notes ?Electronic Signature(s) ?Signed: 06/14/2021 5:03:52 PM By: [name] MD FACS ?Signed: 06/14/2021 5:40:12 PM By: [name] RN, BSN ?Signed: 06/14/2021 5:40:12 PM By: [name] RN, BSN ?Entered By: [name] on 06/14/2021 17:03:52 ?-------------------------------------------------------------------------------- ?Multi-Disciplinary Care Plan Details ?Patient Name: ?Date of Service: ?[name], [name] 06/14/2021 3:45 PM ?Medical Record Number: [id number] ?Patient Account Number: [id number] ?Date of Birth/Sex: ?Treating RN: ?09-01-1955 (66 y.o. [name], [name] ?Primary Care Dieter Hane: [name] ?Other Clinician: ?Referring Dezmin Kittelson: ?Treating Lovely Kerins/Extender: [name] ?[name] ?Weeks in Treatment: 16 ?Multidisciplinary Care Plan reviewed with physician ?Active Inactive ?Venous Leg Ulcer ?Nursing Diagnoses: ?Actual venous Insuffiency (use after diagnosis is confirmed) ?Goals: ?Patient will maintain optimal edema control ?Date Initiated: 02/21/2021 ?Target Resolution Date: 07/14/2021 ?Goal Status: Active ?Patient/caregiver will verbalize understanding of disease process and disease management ?Date Initiated: 02/21/2021 ?Date Inactivated: 03/29/2021 ?Target Resolution Date: 03/24/2021 ?Goal  Status: Met ?Interventions: ?Assess peripheral edema status every visit. ?Compression as ordered ?Provide education on venous insufficiency ?Notes: ?Wound/Skin Impairment ?Nursing Diagnoses: ?Impaired tissue i

## 2021-06-19 ENCOUNTER — Encounter (HOSPITAL_BASED_OUTPATIENT_CLINIC_OR_DEPARTMENT_OTHER): Payer: 59 | Admitting: General Surgery

## 2021-06-20 ENCOUNTER — Encounter (HOSPITAL_BASED_OUTPATIENT_CLINIC_OR_DEPARTMENT_OTHER): Payer: 59 | Admitting: General Surgery

## 2021-06-20 DIAGNOSIS — I7389 Other specified peripheral vascular diseases: Secondary | ICD-10-CM | POA: Diagnosis not present

## 2021-06-20 DIAGNOSIS — I87332 Chronic venous hypertension (idiopathic) with ulcer and inflammation of left lower extremity: Secondary | ICD-10-CM | POA: Diagnosis not present

## 2021-06-20 DIAGNOSIS — L97811 Non-pressure chronic ulcer of other part of right lower leg limited to breakdown of skin: Secondary | ICD-10-CM | POA: Diagnosis not present

## 2021-06-20 DIAGNOSIS — I10 Essential (primary) hypertension: Secondary | ICD-10-CM | POA: Diagnosis not present

## 2021-06-20 DIAGNOSIS — L97828 Non-pressure chronic ulcer of other part of left lower leg with other specified severity: Secondary | ICD-10-CM | POA: Diagnosis not present

## 2021-06-20 DIAGNOSIS — L97822 Non-pressure chronic ulcer of other part of left lower leg with fat layer exposed: Secondary | ICD-10-CM | POA: Diagnosis not present

## 2021-06-20 DIAGNOSIS — I872 Venous insufficiency (chronic) (peripheral): Secondary | ICD-10-CM | POA: Diagnosis not present

## 2021-06-22 NOTE — Progress Notes (Signed)
[name] ([id number]) ?Visit Report for 06/20/2021 ?Chief Complaint Document Details ?Patient Name: Date of Service: ?[name], [name] 06/20/2021 3:00 PM ?Medical Record Number: [id number] ?Patient Account Number: [id number] ?Date of Birth/Sex: Treating RN: ?10/02/1955 (66 y.o. [name], [name] ?Primary Care Provider: [name] Other Clinician: ?Referring Provider: ?Treating Provider/Extender: [name] ?[name] ?Weeks in Treatment: 17 ?Information Obtained from: Patient ?Chief Complaint ?04/19/2021: The patient is here for ongoing follow-up regarding 2 left lower extremity wounds. ?Electronic Signature(s) ?Signed: 06/20/2021 3:58:12 PM By: [name] MD FACS ?Entered By: [name] on 06/20/2021 15:58:12 ?-------------------------------------------------------------------------------- ?Debridement Details ?Patient Name: Date of Service: ?[name], [name] 06/20/2021 3:00 PM ?Medical Record Number: [id number] ?Patient Account Number: [id number] ?Date of Birth/Sex: Treating RN: ?23-Sep-1955 (66 y.o. F) [name], [name] ?Primary Care Provider: [name] Other Clinician: ?Referring Provider: ?Treating Provider/Extender: [name] ?[name] ?Weeks in Treatment: 17 ?Debridement Performed for Assessment: Wound #3 Left,Lateral Lower Leg ?Performed By: Physician [name], MD ?Debridement Type: Debridement ?Severity of Tissue Pre Debridement: Fat layer exposed ?Level of Consciousness (Pre-procedure): Awake and Alert ?Pre-procedure Verification/Time Out Yes - 15:36 ?Taken: ?Start Time: 15:36 ?Pain Control: ?Other : Benzocaine 20% ?T Area Debrided (L x W): ?otal 1.5 (cm) x 1.8 (cm) = 2.7 (cm?) ?Tissue and other material debrided: Non-Viable, Slough, Subcutaneous, [place] ?Level: Skin/Subcutaneous Tissue ?Debridement Description: Excisional ?Instrument: Curette ?Bleeding: Minimum ?Hemostasis Achieved: Pressure ?End Time: 15:36 ?Procedural Pain: 0 ?Post Procedural Pain:  0 ?Response to Treatment: Procedure was tolerated well ?Level of Consciousness (Post- Awake and Alert ?procedure): ?Post Debridement Measurements of Total Wound ?Length: (cm) 1.5 ?Width: (cm) 1.8 ?Depth: (cm) 0.2 ?Volume: (cm?) 0.424 ?Character of Wound/Ulcer Post Debridement: Improved ?Severity of Tissue Post Debridement: Fat layer exposed ?Post Procedure Diagnosis ?Same as Pre-procedure ?Electronic Signature(s) ?Signed: 06/20/2021 4:17:55 PM By: [name] MD FACS ?Signed: 06/20/2021 4:20:47 PM By: [name] RN ?Entered By: [name] on 06/20/2021 15:40:11 ?-------------------------------------------------------------------------------- ?HPI Details ?Patient Name: Date of Service: ?[name], [name] 06/20/2021 3:00 PM ?Medical Record Number: [id number] ?Patient Account Number: [id number] ?Date of Birth/Sex: Treating RN: ?[birth year] (66 y.o. 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 She tells [name] that she fell on the gravel driveway in July. She developed injuries on ?her distal lower leg which have not healed. She saw her primary physician on 11/15/2017 who noted her left shin injuries. Gave her antibiotics. At that point the ?wounds were almost circumferential however most were less than 1.5 cm. Weeping edema fluid was noted. She was referred here for evaluation. The patient ?has a history of chronic lower extremity edema. 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 She has had prior history with leg weeping fluid. She does not wear compression stockings. She is not doing ?anything specific to these wound  areas. ?The patient has a history of obesity, arthritis, peripheral vascular disease hypertension lower extremity edema and Schamberg's disease ?ABI in our clinic was 1.3 on the left ?12/17/2017; patient readmitted to the clinic last week. 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 She also has ?lymphedema. Put her in 3 layer compression and silver alginate last week. She has 3 small wounds with  depth just lateral to the tibia. More problematically than ?this she has numerous shallow areas some of which are almost canal like in shape with tightly adherent painful debris. It would be very difficult and time- ?consuming to go through this and attempt to individually debride all these areas.. I changed her to collagen today to see if that would help with any of the ?surface debris on some of these wounds. Otherwise we will not be able to put this in compression we have to have someone change the dressing. The patient ?is not eligible for home health ?12/25/17 on evaluation today patient actually appears to be doing rather well in regard to the ulcer on her lower extremity. Fortunately there does not appear to ?be evidence of infection at this time. She has been tolerating the dressing changes without complication. This includes the compression wrap. The only issue ?she had was that the wrap was initially placed over her bunion region which actually calls her some discomfort and pain. Other than that things seem to be ?going rather well. ?01/01/2018 Seen today for follow-up and management of left lower extremity wound and lymphedema. T oday she presents with a new wound towards to the left ?lateral LE. Recently treated with a 7 day course of amoxicillin; reason for antibiotic dose is unknown at this time. Tolerating current treatment of collagen with 4- ?layer wraps. She obtained a venous reflux study on 12/25/17. Studies show on the right abnormal reflux times of the  popliteal vein, great saphenous vein at ?the saphenofemoral junction at the proximal thigh, great saphenous vein at the mid calf, and origin of the small saphenous vein.No superficial thrombosis. No ?deep vein thrombosis in the common femoral, femoral,and popliteal veins. Left abnormal reflex times as well of the common femoral vein, popliteal vein, and ?a great saphenous vein at the saphenofemoral junction, In great saphenous vein at the mid thigh w/o thrombosis. Has any issues or concerns during visit ?today. Recommended follow-up to vascular specialist due to abnormalities from the venous reflux study. Denies fever, pain, chills, dizziness, nausea, or ?vomiting. ?01/08/18 upon evaluation today the patient actually seems to be showing some signs of improvement in my opinion at this point in regard to the lower ?extremity ulcerated areas. She still has a lot of drainage but fortunately nothing that appears to be too significant currently. I have been very happy with the ?overall progress I see today compared to where things were during the last evaluation that I had with her. Nonetheless she has not had her appointment with ?the vein specialist as of yet in fact we were able to get this approved for her today and confirmed with them she will be seeing them on December 26. ?Nonetheless in general I do feel like the compression wraps is doing well for her. ?01/17/18; quite a bit of improvement since last time I saw this patient she has a small open area remaining on the left lateral calf and even smaller area ?medially. She has lymphedema chronic stasis changes with distal skin fibrosis. She has an appointment with vascular surgery later this month ?01/24/2018; the patient's medial leg has closed. Still a small open area on the left lateral leg. She states that the 4 layer compression we put on last week was ?too tight and she had to take it off over a few days ago. We have had resultant increase in her lymphedema in  the dorsal foot and a proximal calf. Fortunately ?that does not seem to have resulted  in any deterioration in her wounds ?The patient is going to need compression stockings. We have given her measurements to phone elas

## 2021-06-22 NOTE — Progress Notes (Signed)
ANECIA, NUSBAUM ([id number]) ?Visit Report for 06/20/2021 ?Arrival Information Details ?Patient Name: Date of Service: ?[name], [name] 06/20/2021 3:00 PM ?Medical Record Number: [id number] ?Patient Account Number: [id number] ?Date of Birth/Sex: Treating RN: ?1955-12-08 (66 y.o. [name], [name] ?Primary Care Vaibhav Fogleman: [name] Other Clinician: ?Referring Bransen Fassnacht: ?Treating Alvenia Treese/Extender: [name] ?[name] ?Weeks in Treatment: 17 ?Visit Information History Since Last Visit ?Added or deleted any medications: No ?Patient Arrived: [name] ?Any new allergies or adverse reactions: No ?Arrival Time: 15:24 ?Had a fall or experienced change in No ?Accompanied By: self ?activities of daily living that may affect ?Transfer Assistance: None ?risk of falls: ?Patient Identification Verified: Yes ?Signs or symptoms of abuse/neglect since last visito No ?Secondary Verification Process Completed: Yes ?Hospitalized since last visit: No ?Patient Requires Transmission-Based Precautions: No ?Implantable device outside of the clinic excluding No ?Patient Has Alerts: No ?cellular tissue based products placed in the center ?since last visit: ?Has Dressing in Place as Prescribed: Yes ?Pain Present Now: No ?Electronic Signature(s) ?Signed: 06/21/2021 7:47:55 AM By: Sandre Kitty ?Entered By: Sandre Kitty on 06/20/2021 15:24:28 ?-------------------------------------------------------------------------------- ?Compression Therapy Details ?Patient Name: Date of Service: ?SEMONE, ORLOV 06/20/2021 3:00 PM ?Medical Record Number: [id number] ?Patient Account Number: [id number] ?Date of Birth/Sex: Treating RN: ?08/13/1955 (66 y.o. F) [name], [name] ?Primary Care Griffith Santilli: [name] Other Clinician: ?Referring Brooklyn Jeff: ?Treating Leiya Keesey/Extender: [name] ?[name] ?Weeks in Treatment: 17 ?Compression Therapy Performed for Wound Assessment: Wound #3 Left,Lateral Lower Leg ?Performed By: Clinician  [name], RN ?Compression Type: Double Layer ?Post Procedure Diagnosis ?Same as Pre-procedure ?Electronic Signature(s) ?Signed: 06/20/2021 4:20:47 PM By: [name] RN ?Entered By: [name] on 06/20/2021 15:40:45 ?-------------------------------------------------------------------------------- ?Encounter Discharge Information Details ?Patient Name: ?Date of Service: ?STANLEY, HELMUTH 06/20/2021 3:00 PM ?Medical Record Number: [id number] ?Patient Account Number: [id number] ?Date of Birth/Sex: ?Treating RN: ?Dec 28, 1955 (66 y.o. F) [name], [name] ?Primary Care Audi Wettstein: [name] ?Other Clinician: ?Referring Shay Jhaveri: ?Treating Corean Yoshimura/Extender: [name] ?[name] ?Weeks in Treatment: 17 ?Encounter Discharge Information Items Post Procedure Vitals ?Discharge Condition: Stable ?Temperature (F): 97.7 ?Ambulatory Status: [name] ?Pulse (bpm): 79 ?Discharge Destination: Home ?Respiratory Rate (breaths/min): 16 ?Transportation: Private Auto ?Blood Pressure (mmHg): 113/67 ?Accompanied By: self ?Schedule Follow-up Appointment: Yes ?Clinical Summary of Care: Patient Declined ?Electronic Signature(s) ?Signed: 06/20/2021 4:20:47 PM By: [name] RN ?Entered By: [name] on 06/20/2021 16:12:55 ?-------------------------------------------------------------------------------- ?Lower Extremity Assessment Details ?Patient Name: ?Date of Service: ?LOVETTE, MERTA 06/20/2021 3:00 PM ?Medical Record Number: [id number] ?Patient Account Number: [id number] ?Date of Birth/Sex: ?Treating RN: ?06/01/1955 (66 y.o. F) [name], [name] ?Primary Care Zniyah Midkiff: [name] ?Other Clinician: ?Referring Toy Eisemann: ?Treating Marlette Curvin/Extender: [name] ?[name] ?Weeks in Treatment: 17 ?Edema Assessment ?Assessed: [Left: No] [Right: No] ?Edema: [Left: Ye] [Right: s] ?Calf ?Left: Right: ?Point of Measurement: 29 cm From Medial Instep 42.1 cm ?Ankle ?Left: Right: ?Point of Measurement: From  Medial Instep 24 cm ?Electronic Signature(s) ?Signed: 06/20/2021 4:20:47 PM By: [name] RN ?Entered By: [name] on 06/20/2021 15:34:18 ?-------------------------------------------------------------------------------- ?Multi Wound Chart Details ?Patient Name: ?Date of Service: ?TAYLIE, HELDER 06/20/2021 3:00 PM ?Medical Record Number: [id number] ?Patient Account Number: [id number] ?Date of Birth/Sex: ?Treating RN: ?05/10/1955 (66 y.o. [name], [name] ?Primary Care Dhaval Woo: [name] ?Other Clinician: ?Referring Nechemia Chiappetta: ?Treating Diasha Castleman/Extender: [name] ?[name] ?Weeks in Treatment: 17 ?Vital Signs ?Height(in): 62 ?Pulse(bpm): 79 ?Weight(lbs): 226 ?Blood Pressure(mmHg): 113/67 ?Body Mass Index(BMI): 41.3 ?Temperature(??F): 97.7 ?Respiratory Rate(breaths/min): 16 ?Photos: [N/A:N/A] ?Left, Lateral Lower Leg N/A N/A ?Wound Location: ?Blister  N/A N/A ?Wounding Event: ?Venous Leg Ulcer N/A N/A ?Primary Etiology: ?Cataracts, Lymphedema, N/A N/A ?Comorbid History: ?Hypertension, Peripheral Venous ?Disease, Osteoarthritis ?12/13/2020 N/A N/A ?Date Acquired: ?12 N/A N/A ?Weeks of Treatment: ?Open N/A N/A ?Wound Status: ?No N/A N/A ?Wound Recurrence: ?1.5x1.8x0.2 N/A N/A ?Measurements L x W x D (cm) ?2.121 N/A N/A ?A (cm?) : ?rea ?0.424 N/A N/A ?Volume (cm?) : ?92.70% N/A N/A ?% Reduction in A rea: ?92.70% N/A N/A ?% Reduction in Volume: ?Full Thickness Without Exposed N/A N/A ?Classification: ?Support Structures ?Medium N/A N/A ?Exudate A mount: ?Serosanguineous N/A N/A ?Exudate Type: ?red, brown N/A N/A ?Exudate Color: ?Flat and Intact N/A N/A ?Wound Margin: ?Medium (34-66%) N/A N/A ?Granulation A mount: ?Red, Pink N/A N/A ?Granulation Quality: ?Medium (34-66%) N/A N/A ?Necrotic A mount: ?Fat Layer (Subcutaneous Tissue): Yes N/A N/A ?Exposed Structures: ?Fascia: No ?Tendon: No ?Muscle: No ?Joint: No ?Bone: No ?Medium (34-66%) N/A N/A ?Epithelialization: ?Debridement - Excisional N/A  N/A ?Debridement: ?Pre-procedure Verification/Time Out 15:36 N/A N/A ?Taken: ?Other N/A N/A ?Pain Control: ?Subcutaneous, Slough N/A N/A ?Tissue Debrided: ?Skin/Subcutaneous Tissue N/A N/A ?Level: ?2.7 N/A N/A ?Debridement A (sq cm): ?rea ?Curette N/A N/A ?Instrument: ?Minimum N/A N/A ?Bleeding: ?Pressure N/A N/A ?Hemostasis A chieved: ?0 N/A N/A ?Procedural Pain: ?0 N/A N/A ?Post Procedural Pain: ?Procedure was tolerated well N/A N/A ?Debridement Treatment Response: ?1.5x1.8x0.2 N/A N/A ?Post Debridement Measurements L x ?W x D (cm) ?0.424 N/A N/A ?Post Debridement Volume: (cm?) ?Compression Therapy N/A N/A ?Procedures Performed: ?Debridement ?Treatment Notes ?Electronic Signature(s) ?Signed: 06/20/2021 3:58:06 PM By: [name] MD FACS ?Signed: 06/22/2021 5:21:10 PM By: [name] RN, BSN ?Entered By: [name] on 06/20/2021 15:58:06 ?-------------------------------------------------------------------------------- ?Multi-Disciplinary Care Plan Details ?Patient Name: ?Date of Service: ?MAGAN, WINNETT 06/20/2021 3:00 PM ?Medical Record Number: [id number] ?Patient Account Number: [id number] ?Date of Birth/Sex: ?Treating RN: ?1955/03/07 (66 y.o. F) [name], [name] ?Primary Care Jahking Lesser: [name] ?Other Clinician: ?Referring Tessie Ordaz: ?Treating Lindsie Simar/Extender: [name] ?[name] ?Weeks in Treatment: 17 ?Multidisciplinary Care Plan reviewed with physician ?Active Inactive ?Venous Leg Ulcer ?Nursing Diagnoses: ?Actual venous Insuffiency (use after diagnosis is confirmed) ?Goals: ?Patient will maintain optimal edema control ?Date Initiated: 02/21/2021 ?Target Resolution Date: 07/14/2021 ?Goal Status: Active ?Patient/caregiver will verbalize understanding of disease process and disease management ?Date Initiated: 02/21/2021 ?Date Inactivated: 03/29/2021 ?Target Resolution Date: 03/24/2021 ?Goal Status: Met ?Interventions: ?Assess peripheral edema status every visit. ?Compression as  ordered ?Provide education on venous insufficiency ?Notes: ?Wound/Skin Impairment ?Nursing Diagnoses: ?Impaired tissue integrity ?Knowledge deficit related to ulceration/compromised skin integrity ?Goals: ?Patient/c

## 2021-06-27 ENCOUNTER — Encounter (HOSPITAL_BASED_OUTPATIENT_CLINIC_OR_DEPARTMENT_OTHER): Payer: 59 | Admitting: General Surgery

## 2021-06-27 DIAGNOSIS — I872 Venous insufficiency (chronic) (peripheral): Secondary | ICD-10-CM | POA: Diagnosis not present

## 2021-06-27 DIAGNOSIS — L97811 Non-pressure chronic ulcer of other part of right lower leg limited to breakdown of skin: Secondary | ICD-10-CM | POA: Diagnosis not present

## 2021-06-27 DIAGNOSIS — L97822 Non-pressure chronic ulcer of other part of left lower leg with fat layer exposed: Secondary | ICD-10-CM | POA: Diagnosis not present

## 2021-06-27 DIAGNOSIS — I87332 Chronic venous hypertension (idiopathic) with ulcer and inflammation of left lower extremity: Secondary | ICD-10-CM | POA: Diagnosis not present

## 2021-06-27 DIAGNOSIS — L97828 Non-pressure chronic ulcer of other part of left lower leg with other specified severity: Secondary | ICD-10-CM | POA: Diagnosis not present

## 2021-06-27 NOTE — Progress Notes (Signed)
LABRIA, WOS ([id number]) ?Visit Report for 06/27/2021 ?Chief Complaint Document Details ?Patient Name: Date of Service: ?[name], [name] 06/27/2021 12:30 PM ?Medical Record Number: [id number] ?Patient Account Number: [id number] ?Date of Birth/Sex: Treating RN: ?[birth year] (66 y.o. F) ?Primary Care Provider: [name] Other Clinician: ?Referring Provider: ?Treating Provider/Extender: [name] ?[name] ?Weeks in Treatment: 18 ?Information Obtained from: Patient ?Chief Complaint ?04/19/2021: The patient is here for ongoing follow-up regarding 2 left lower extremity wounds. ?Electronic Signature(s) ?Signed: 06/27/2021 1:35:43 PM By: [name] MD FACS ?Entered By: [name] on 06/27/2021 13:35:43 ?-------------------------------------------------------------------------------- ?Debridement Details ?Patient Name: Date of Service: ?[name], [name] 06/27/2021 12:30 PM ?Medical Record Number: [id number] ?Patient Account Number: [id number] ?Date of Birth/Sex: Treating RN: ?[birth year] (66 y.o. F) [name], [name] ?Primary Care Provider: [name] Other Clinician: ?Referring Provider: ?Treating Provider/Extender: [name] ?[name] ?Weeks in Treatment: 18 ?Debridement Performed for Assessment: Wound #3 Left,Lateral Lower Leg ?Performed By: Physician [name], MD ?Debridement Type: Debridement ?Severity of Tissue Pre Debridement: Fat layer exposed ?Level of Consciousness (Pre-procedure): Awake and Alert ?Pre-procedure Verification/Time Out Yes - 13:25 ?Taken: ?Start Time: 13:25 ?Pain Control: Lidocaine 4% T opical Solution ?T Area Debrided (L x W): ?otal 1 (cm) x 0.7 (cm) = 0.7 (cm?) ?Tissue and other material debrided: Non-Viable, [place], [place] ?Level: Non-Viable Tissue ?Debridement Description: Selective/Open Wound ?Instrument: Curette ?Bleeding: Minimum ?Hemostasis Achieved: Pressure ?End Time: 13:27 ?Procedural Pain: 0 ?Post Procedural Pain: 0 ?Response to  Treatment: Procedure was tolerated well ?Level of Consciousness (Post- Awake and Alert ?procedure): ?Post Debridement Measurements of Total Wound ?Length: (cm) 1 ?Width: (cm) 0.7 ?Depth: (cm) 0.2 ?Volume: (cm?) 0.11 ?Character of Wound/Ulcer Post Debridement: Improved ?Severity of Tissue Post Debridement: Fat layer exposed ?Post Procedure Diagnosis ?Same as Pre-procedure ?Electronic Signature(s) ?Signed: 06/27/2021 2:52:52 PM By: [name] MD FACS ?Signed: 06/27/2021 5:16:10 PM By: [name] RN ?Entered By: [name] on 06/27/2021 13:31:13 ?-------------------------------------------------------------------------------- ?HPI Details ?Patient Name: Date of Service: ?[name], [name] 06/27/2021 12:30 PM ?Medical Record Number: [id number] ?Patient Account Number: [id number] ?Date of Birth/Sex: Treating RN: ?07-Apr-1955 (66 y.o. 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 She tells [name] that she fell on the gravel driveway in July. She developed injuries on ?her distal lower leg which have not healed. She saw her primary physician on 11/15/2017 who noted her left shin injuries. Gave her antibiotics. At that point the ?wounds were almost circumferential however most were less than 1.5 cm. Weeping edema fluid was noted. She was referred here for evaluation. The patient ?has a history of chronic lower extremity edema. 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 She has had prior history with leg weeping fluid. She does not wear compression stockings. She is not doing ?anything specific to these wound areas. ?The patient has a history of  obesity, arthritis, peripheral vascular disease hypertension lower extremity edema and Schamberg's disease ?ABI in our clinic was 1.3 on the left ?12/17/2017; patient readmitted to the clinic last week. 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 She also has ?lymphedema. Put her in 3 layer compression and silver alginate last week. She has 3 small wounds with depth just lateral  to the tibia. More problematically than ?this she has numerous shallow areas some of which are almost canal like in shape with tightly adherent painful debris. It would be very difficult and time- ?consuming to go through this and attempt to individually debride all these areas.. I changed her to collagen today to see if that would help with any of the ?surface debris on some of these wounds. Otherwise we will not be able to put this in compression we have to have someone change the dressing. The patient ?is not eligible for home health ?12/25/17 on evaluation today patient actually appears to be doing rather well in regard to the ulcer on her lower extremity. Fortunately there does not appear to ?be evidence of infection at this time. She has been tolerating the dressing changes without complication. This includes the compression wrap. The only issue ?she had was that the wrap was initially placed over her bunion region which actually calls her some discomfort and pain. Other than that things seem to be ?going rather well. ?01/01/2018 Seen today for follow-up and management of left lower extremity wound and lymphedema. T oday she presents with a new wound towards to the left ?lateral LE. Recently treated with a 7 day course of amoxicillin; reason for antibiotic dose is unknown at this time. Tolerating current treatment of collagen with 4- ?layer wraps. She obtained a venous reflux study on 12/25/17. Studies show on the right abnormal reflux times of the popliteal vein, great saphenous vein  at ?the saphenofemoral junction at the proximal thigh, great saphenous vein at the mid calf, and origin of the small saphenous vein.No superficial thrombosis. No ?deep vein thrombosis in the common femoral, femoral,and popliteal veins. Left abnormal reflex times as well of the common femoral vein, popliteal vein, and ?a great saphenous vein at the saphenofemoral junction, In great saphenous vein at the mid thigh w/o thrombosis. Has any issues or concerns during visit ?today. Recommended follow-up to vascular specialist due to abnormalities from the venous reflux study. Denies fever, pain, chills, dizziness, nausea, or ?vomiting. ?01/08/18 upon evaluation today the patient actually seems to be showing some signs of improvement in my opinion at this point in regard to the lower ?extremity ulcerated areas. She still has a lot of drainage but fortunately nothing that appears to be too significant currently. I have been very happy with the ?overall progress I see today compared to where things were during the last evaluation that I had with her. Nonetheless she has not had her appointment with ?the vein specialist as of yet in fact we were able to get this approved for her today and confirmed with them she will be seeing them on December 26. ?Nonetheless in general I do feel like the compression wraps is doing well for her. ?01/17/18; quite a bit of improvement since last time I saw this patient she has a small open area remaining on the left lateral calf and even smaller area ?medially. She has lymphedema chronic stasis changes with distal skin fibrosis. She has an appointment with vascular surgery later this month ?01/24/2018; the patient's medial leg has closed. Still a small open area on the left lateral leg. She states that the 4 layer compression we put on last week was ?too tight and she had to take it off over a few days ago. We have had resultant increase in her lymphedema in the dorsal foot and a proximal calf.  Fortunately ?that does not seem to have resulted in any deterioration  in her wounds ?The patient is going to need compression stockings. We have given her measurements to phone elastic therapy in [place]. She

## 2021-06-27 NOTE — Progress Notes (Signed)
[name] ([id number]) ?Visit Report for 06/27/2021 ?Arrival Information Details ?Patient Name: Date of Service: ?[name], [name] 06/27/2021 12:30 PM ?Medical Record Number: [id number] ?Patient Account Number: [id number] ?Date of Birth/Sex: Treating RN: ?06-22-1955 (66 y.o. F) [name], [name] ?Primary Care Hazel Leveille: [name] Other Clinician: ?Referring Jazilyn Siegenthaler: ?Treating Amalya Salmons/Extender: [name] ?[name] ?Weeks in Treatment: 18 ?Visit Information History Since Last Visit ?Added or deleted any medications: No ?Patient Arrived: [name] ?Any new allergies or adverse reactions: No ?Arrival Time: 12:55 ?Had a fall or experienced change in No ?Accompanied By: self ?activities of daily living that [name] affect ?Transfer Assistance: None ?risk of falls: ?Patient Requires Transmission-Based Precautions: No ?Signs or symptoms of abuse/neglect since last visito No ?Patient Has Alerts: No ?Hospitalized since last visit: No ?Implantable device outside of the clinic excluding No ?cellular tissue based products placed in the center ?since last visit: ?Has Dressing in Place as Prescribed: Yes ?Pain Present Now: No ?Electronic Signature(s) ?Signed: 06/27/2021 5:16:10 PM By: [name] RN ?Entered By: [name] on 06/27/2021 12:59:00 ?-------------------------------------------------------------------------------- ?Compression Therapy Details ?Patient Name: Date of Service: ?[name], [name] 06/27/2021 12:30 PM ?Medical Record Number: [id number] ?Patient Account Number: [id number] ?Date of Birth/Sex: Treating RN: ?1955-08-10 (66 y.o. F) [name], [name] ?Primary Care Jacarius Handel: [name] Other Clinician: ?Referring Kanen Mottola: ?Treating Dina Warbington/Extender: [name] ?[name] ?Weeks in Treatment: 18 ?Compression Therapy Performed for Wound Assessment: Wound #6 Right,Anterior Lower Leg ?Performed By: Clinician [name], RN ?Compression Type: Double Layer ?Post Procedure  Diagnosis ?Same as Pre-procedure ?Electronic Signature(s) ?Signed: 06/27/2021 5:16:10 PM By: [name] RN ?Entered By: [name] on 06/27/2021 13:37:48 ?-------------------------------------------------------------------------------- ?Compression Therapy Details ?Patient Name: ?Date of Service: ?[name], [name] 06/27/2021 12:30 PM ?Medical Record Number: [id number] ?Patient Account Number: [id number] ?Date of Birth/Sex: ?Treating RN: ?1955-12-27 (66 y.o. F) [name], [name] ?Primary Care Gearl Baratta: [name] ?Other Clinician: ?Referring Ellisha Bankson: ?Treating Stanford Strauch/Extender: [name] ?[name] ?Weeks in Treatment: 18 ?Compression Therapy Performed for Wound Assessment: Wound #3 Left,Lateral Lower Leg ?Performed By: Clinician [name], RN ?Compression Type: Double Layer ?Post Procedure Diagnosis ?Same as Pre-procedure ?Electronic Signature(s) ?Signed: 06/27/2021 5:16:10 PM By: [name] RN ?Entered By: [name] on 06/27/2021 13:37:48 ?-------------------------------------------------------------------------------- ?Encounter Discharge Information Details ?Patient Name: ?Date of Service: ?[name], [name] 06/27/2021 12:30 PM ?Medical Record Number: [id number] ?Patient Account Number: [id number] ?Date of Birth/Sex: ?Treating RN: ?[birth year] (66 y.o. F) [name], [name] ?Primary Care Angel Weedon: [name] ?Other Clinician: ?Referring Kohana Amble: ?Treating Robina Hamor/Extender: [name] ?[name] ?Weeks in Treatment: 18 ?Encounter Discharge Information Items Post Procedure Vitals ?Discharge Condition: Stable ?Temperature (F): 97.7 ?Ambulatory Status: [name] ?Pulse (bpm): 87 ?Discharge Destination: Home ?Respiratory Rate (breaths/min): 18 ?Transportation: Private Auto ?Blood Pressure (mmHg): 112/73 ?Accompanied By: self ?Schedule Follow-up Appointment: Yes ?Clinical Summary of Care: Patient Declined ?Electronic Signature(s) ?Signed: 06/27/2021 5:16:10 PM By: [name] RN ?Entered By: [name] on 06/27/2021 17:14:35 ?-------------------------------------------------------------------------------- ?Lower Extremity Assessment Details ?Patient Name: ?Date of Service: ?[name], [name] 06/27/2021 12:30 PM ?Medical Record Number: [id number] ?Patient Account Number: [id number] ?Date of Birth/Sex: ?Treating RN: ?[birth year] (66 y.o. F) [name], [name] ?Primary Care Elka [name]: [name] ?Other Clinician: ?Referring Dyami Umbach: ?Treating Frankye Schwegel/Extender: [name] ?[name] ?Weeks in Treatment: 18 ?Edema Assessment ?Assessed: [Left: No] [Right: No] ?Edema: [Left: Ye] [Right: s] ?Calf ?Left: Right: ?Point of Measurement: 29 cm From Medial Instep 42.1 cm 49 cm ?Ankle ?Left: Right: ?Point of Measurement: 10 cm From Medial Instep 24 cm 28 cm ?Knee To Floor ?Left: Right: ?From Medial  Instep 46 cm 46 cm ?Electronic Signature(s) ?Signed: 06/27/2021 5:16:10 PM By: [name] RN ?Entered By: [name] on 06/27/2021 13:24:57 ?-------------------------------------------------------------------------------- ?Multi Wound Chart Details ?Patient Name: ?Date of Service: ?[name], [name] 06/27/2021 12:30 PM ?Medical Record Number: [id number] ?Patient Account Number: [id number] ?Date of Birth/Sex: ?Treating RN: ?06-06-1955 (66 y.o. F) ?Primary Care Rondle Lohse: [name] ?Other Clinician: ?Referring Elisse Pennick: ?Treating Anitha Kreiser/Extender: [name] ?[name] ?Weeks in Treatment: 18 ?Vital Signs ?Height(in): 62 ?Pulse(bpm): 87 ?Weight(lbs): 226 ?Blood Pressure(mmHg): 112/73 ?Body Mass Index(BMI): 41.3 ?Temperature(??F): 97.7 ?Respiratory Rate(breaths/min): 18 ?Photos: [N/A:N/A] ?Left, Lateral Lower Leg Right, Anterior Lower Leg N/A ?Wound Location: ?Blister Gradually Appeared N/A ?Wounding Event: ?Venous Leg Ulcer Venous Leg Ulcer N/A ?Primary Etiology: ?Cataracts, Lymphedema, Cataracts, Lymphedema, N/A ?Comorbid History: ?Hypertension, Peripheral Venous  Hypertension, Peripheral Venous ?Disease, Osteoarthritis Disease, Osteoarthritis ?12/13/2020 06/20/2021 N/A ?Date Acquired: ?62 0 N/A ?Weeks of Treatment: ?Open Open N/A ?Wound Status: ?No No N/A ?Wound Recurrence: ?1x0.7x0.2 0.2x0.2x0.1 N/A ?Measurements L x W x D (cm) ?0.55 0.031 N/A ?A (cm?) : ?rea ?0.11 0.003 N/A ?Volume (cm?) : ?98.10% 0.00% N/A ?% Reduction in Area: ?98.10% 0.00% N/A ?% Reduction in Volume: ?Full Thickness Without Exposed Full Thickness Without Exposed N/A ?Classification: ?Support Structures Support Structures ?Medium Medium N/A ?Exudate Amount: ?Serosanguineous Serous N/A ?Exudate Type: ?red, brown amber N/A ?Exudate Color: ?Flat and Intact N/A N/A ?Wound Margin: ?Medium (34-66%) Large (67-100%) N/A ?Granulation Amount: ?Red, Pink Pink N/A ?Granulation Quality: ?Medium (34-66%) None Present (0%) N/A ?Necrotic Amount: ?Fascia: No ?Fat Layer (Subcutaneous Tissue): Yes N/A ?Exposed Structures: ?Fat Layer (Subcutaneous Tissue): No ?Fascia: No ?Tendon: No ?Tendon: No ?Muscle: No ?Muscle: No ?Joint: No ?Joint: No ?Bone: No ?Bone: No ?Large (67-100%) Medium (34-66%) N/A ?Epithelialization: ?Debridement - Selective/Open Wound N/A N/A ?Debridement: ?Pre-procedure Verification/Time Out 13:25 N/A N/A ?Taken: ?Lidocaine 4% Topical Solution N/A N/A ?Pain Control: ?Slough N/A N/A ?Tissue Debrided: ?Non-Viable Tissue N/A N/A ?Level: ?0.7 N/A N/A ?Debridement A (sq cm): ?rea ?Curette N/A N/A ?Instrument: ?Minimum N/A N/A ?Bleeding: ?Pressure N/A N/A ?Hemostasis A chieved: ?0 N/A N/A ?Procedural Pain: ?0 N/A N/A ?Post Procedural Pain: ?Procedure was tolerated well N/A N/A ?Debridement Treatment Response: ?1x0.7x0.2 N/A N/A ?Post Debridement Measurements L x ?W x D (cm) ?0.11 N/A N/A ?Post Debridement Volume: (cm?) ?Debridement N/A N/A ?Procedures Performed: ?Treatment Notes ?Electronic Signature(s) ?Signed: 06/27/2021 1:35:36 PM By: [name] MD FACS ?Entered By: [name] on 06/27/2021  13:35:36 ?-------------------------------------------------------------------------------- ?Multi-Disciplinary Care Plan Details ?Patient Name: ?Date of Service: ?[name], [name] 06/27/2021 12:30 PM ?Medical Record Numb

## 2021-06-30 ENCOUNTER — Encounter (HOSPITAL_BASED_OUTPATIENT_CLINIC_OR_DEPARTMENT_OTHER): Payer: 59 | Admitting: General Surgery

## 2021-06-30 DIAGNOSIS — L97828 Non-pressure chronic ulcer of other part of left lower leg with other specified severity: Secondary | ICD-10-CM | POA: Diagnosis not present

## 2021-06-30 DIAGNOSIS — L97811 Non-pressure chronic ulcer of other part of right lower leg limited to breakdown of skin: Secondary | ICD-10-CM | POA: Diagnosis not present

## 2021-06-30 DIAGNOSIS — I87332 Chronic venous hypertension (idiopathic) with ulcer and inflammation of left lower extremity: Secondary | ICD-10-CM | POA: Diagnosis not present

## 2021-07-03 NOTE — Progress Notes (Signed)
KAORU, BENDA ([id number]) Visit Report for 06/30/2021 Arrival Information Details Patient Name: Date of Service: [name], [name] 06/30/2021 1:00 PM Medical Record Number: [id number] Patient Account Number: [id number] Date of Birth/Sex: Treating RN: October 25, 1955 (66 y.o. [name] Primary Care Drea Jurewicz: [name] Other Clinician: Referring Anias Bartol: Treating Sandrea Boer/Extender: [name] in Treatment: 18 Visit Information History Since Last Visit Added or deleted any medications: No Patient Arrived: Cane Any new allergies or adverse reactions: No Arrival Time: 13:15 Had a fall or experienced change in No Accompanied By: alone activities of daily living that may affect Transfer Assistance: None risk of falls: Patient Identification Verified: Yes Signs or symptoms of abuse/neglect since last visito No Secondary Verification Process Completed: Yes Hospitalized since last visit: No Patient Requires Transmission-Based Precautions: No Implantable device outside of the clinic excluding No Patient Has Alerts: No cellular tissue based products placed in the center since last visit: Has Dressing in Place as Prescribed: Yes Has Compression in Place as Prescribed: Yes Pain Present Now: No Electronic Signature(s) Signed: 07/03/2021 5:26:25 PM By: [name] RN, BSN Entered By: [name] on 06/30/2021 13:29:24 -------------------------------------------------------------------------------- Compression Therapy Details Patient Name: Date of Service: Erin Gerold [name] 06/30/2021 1:00 PM Medical Record Number: [id number] Patient Account Number: [id number] Date of Birth/Sex: Treating RN: 08/05/1955 (66 y.o. [name] Primary Care Chaddrick Brue: [name] Other Clinician: Referring Kmari Brian: Treating Nhia Heaphy/Extender: [name] in Treatment: 18 Compression Therapy Performed for Wound Assessment: Wound  #3 Left,Lateral Lower Leg Performed By: Clinician [name], RN Compression Type: Double Layer Electronic Signature(s) Signed: 07/03/2021 5:26:25 PM By: [name] RN, BSN Entered By: [name] on 06/30/2021 13:30:07 -------------------------------------------------------------------------------- Encounter Discharge Information Details Patient Name: Date of Service: Erin Gerold [name] 06/30/2021 1:00 PM Medical Record Number: [id number] Patient Account Number: [id number] Date of Birth/Sex: Treating RN: Apr 04, 1955 (66 y.o. [name] Primary Care Wataru Mccowen: [name] Other Clinician: Referring Dawn Kiper: Treating Nelli Swalley/Extender: [name] in Treatment: 18 Encounter Discharge Information Items Discharge Condition: Stable Ambulatory Status: Cane Discharge Destination: Home Transportation: Private Auto Accompanied By: alone Schedule Follow-up Appointment: Yes Clinical Summary of Care: Patient Declined Electronic Signature(s) Signed: 07/03/2021 5:26:25 PM By: [name] RN, BSN Entered By: [name] on 06/30/2021 13:31:46 -------------------------------------------------------------------------------- Wound Assessment Details Patient Name: Date of Service: Erin Gerold [name] 06/30/2021 1:00 PM Medical Record Number: [id number] Patient Account Number: [id number] Date of Birth/Sex: Treating RN: 1955-03-24 (66 y.o. [name] Primary Care Novak Stgermaine: [name] Other Clinician: Referring Valary Manahan: Treating Ayleah Hofmeister/Extender: [name] in Treatment: 18 Wound Status Wound Number: 3 Primary Venous Leg Ulcer Etiology: Wound Location: Left, Lateral Lower Leg Wound Open Wounding Event: Blister Status: Date Acquired: 12/13/2020 Comorbid Cataracts, Lymphedema, Hypertension, Peripheral Venous Weeks Of Treatment: 18 History: Disease, Osteoarthritis Clustered Wound: No Wound  Measurements Length: (cm) 1 Width: (cm) 0.7 Depth: (cm) 0.2 Area: (cm) 0.55 Volume: (cm) 0.11 % Reduction in Area: 98.1% % Reduction in Volume: 98.1% Epithelialization: Medium (34-66%) Tunneling: No Undermining: No Wound Description Classification: Full Thickness Without Exposed Support Structures Wound Margin: Flat and Intact Exudate Amount: Medium Exudate Type: Serosanguineous Exudate Color: red, brown Foul Odor After Cleansing: No Slough/Fibrino Yes Wound Bed Granulation Amount: Medium (34-66%) Exposed Structure Granulation Quality: Red, Pink Fascia Exposed: No Necrotic Amount: Medium (34-66%) Fat Layer (Subcutaneous Tissue) Exposed: Yes Necrotic Quality: Adherent Slough Tendon Exposed: No Muscle Exposed: No Joint Exposed: No Bone Exposed: No Treatment Notes Wound #3 (Lower  Leg) Wound Laterality: Left, Lateral Cleanser Soap and Water Discharge Instruction: May shower and wash wound with dial antibacterial soap and water prior to dressing change. Wound Cleanser Discharge Instruction: Cleanse the wound with wound cleanser prior to applying a clean dressing using gauze sponges, not tissue or cotton balls. Peri-Wound Care Zinc Oxide Ointment 30g tube Discharge Instruction: Apply Zinc Oxide to periwound with each dressing change Sween Lotion (Moisturizing lotion) Discharge Instruction: Apply moisturizing lotion as directed Topical Primary Dressing Hydrofera Blue Classic Foam, 2x2 in Discharge Instruction: Moisten with saline prior to applying to wound bed Secondary Dressing Zetuvit Plus 4x8 in Discharge Instruction: Apply over primary dressing as directed. Secured With Compression Wrap CoFlex TLC XL 2-layer Compression System 4x7 (in/yd) Discharge Instruction: Apply CoFlex 2-layer compression as directed. (alt for 4 layer) Compression Stockings Add-Ons Electronic Signature(s) Signed: 07/03/2021 5:26:25 PM By: [name] RN, BSN Entered By: [name]  on 06/30/2021 13:29:43

## 2021-07-03 NOTE — Progress Notes (Signed)
KISMET, FACEMIRE ([id number]) Visit Report for 06/30/2021 SuperBill Details Patient Name: Date of Service: [name], [name] 06/30/2021 Medical Record Number: [id number] Patient Account Number: [id number] Date of Birth/Sex: Treating RN: 06-22-1955 (66 y.o. [name] Primary Care Provider: [name] Other Clinician: Referring Provider: Treating Provider/Extender: [name] in Treatment: 18 Diagnosis Coding ICD-10 Codes Code Description [phone] Chronic venous hypertension (idiopathic) with ulcer and inflammation of left lower extremity L97.828 Non-pressure chronic ulcer of other part of left lower leg with other specified severity L97.811 Non-pressure chronic ulcer of other part of right lower leg limited to breakdown of skin Facility Procedures CPT4 Code Description Modifier Quantity [id number] (Facility Use Only) [phone] - [facility] Electronic Signature(s) Signed: 06/30/2021 1:50:58 PM By: [name] MD FACS Signed: 07/03/2021 5:26:25 PM By: [name] RN, BSN Entered By: [name] on 06/30/2021 13:31:54

## 2021-07-04 ENCOUNTER — Encounter (HOSPITAL_BASED_OUTPATIENT_CLINIC_OR_DEPARTMENT_OTHER): Payer: 59 | Admitting: General Surgery

## 2021-07-04 DIAGNOSIS — L97822 Non-pressure chronic ulcer of other part of left lower leg with fat layer exposed: Secondary | ICD-10-CM | POA: Diagnosis not present

## 2021-07-04 DIAGNOSIS — I872 Venous insufficiency (chronic) (peripheral): Secondary | ICD-10-CM | POA: Diagnosis not present

## 2021-07-04 DIAGNOSIS — L97811 Non-pressure chronic ulcer of other part of right lower leg limited to breakdown of skin: Secondary | ICD-10-CM | POA: Diagnosis not present

## 2021-07-04 DIAGNOSIS — I87332 Chronic venous hypertension (idiopathic) with ulcer and inflammation of left lower extremity: Secondary | ICD-10-CM | POA: Diagnosis not present

## 2021-07-04 DIAGNOSIS — L97828 Non-pressure chronic ulcer of other part of left lower leg with other specified severity: Secondary | ICD-10-CM | POA: Diagnosis not present

## 2021-07-04 NOTE — Progress Notes (Signed)
BAILLEY, GUILFORD ([id number]) Visit Report for 07/04/2021 Chief Complaint Document Details Patient Name: Date of Service: [name], [name] 07/04/2021 1:15 PM Medical Record Number: [id number] Patient Account Number: [id number] Date of Birth/Sex: Treating RN: [birth year] (66 y.o. [name] Primary Care Provider: [name] Other Clinician: Referring Provider: Treating Provider/Extender: [name] in Treatment: 19 Information Obtained from: Patient Chief Complaint 04/19/2021: The patient is here for ongoing follow-up regarding 2 left lower extremity wounds. Electronic Signature(s) Signed: 07/04/2021 4:20:45 PM By: [name] MD FACS Entered By: [name] on 07/04/2021 16:20:45 -------------------------------------------------------------------------------- Debridement Details Patient Name: Date of Service: Erin Gerold [name] 07/04/2021 1:15 PM Medical Record Number: [id number] Patient Account Number: [id number] Date of Birth/Sex: Treating RN: Dec 14, 1955 (66 y.o. [name] Primary Care Provider: [name] Other Clinician: Referring Provider: Treating Provider/Extender: [name] in Treatment: 19 Debridement Performed for Assessment: Wound #3 Left,Lateral Lower Leg Performed By: Physician [name], MD Debridement Type: Debridement Severity of Tissue Pre Debridement: Fat layer exposed Level of Consciousness (Pre-procedure): Awake and Alert Pre-procedure Verification/Time Out Yes - 14:22 Taken: Start Time: 14:22 T Area Debrided (L x W): otal 1.5 (cm) x 0.8 (cm) = 1.2 (cm) Tissue and other material debrided: Non-Viable, Slough, Subcutaneous, Slough Level: Skin/Subcutaneous Tissue Debridement Description: Excisional Instrument: Curette Bleeding: Minimum Hemostasis Achieved: Pressure End Time: 14:23 Procedural Pain: 0 Post Procedural Pain: 0 Response to Treatment: Procedure was  tolerated well Level of Consciousness (Post- Awake and Alert procedure): Post Debridement Measurements of Total Wound Length: (cm) 1.5 Width: (cm) 0.8 Depth: (cm) 0.2 Volume: (cm) 0.188 Character of Wound/Ulcer Post Debridement: Improved Severity of Tissue Post Debridement: Fat layer exposed Post Procedure Diagnosis Same as Pre-procedure Electronic Signature(s) Signed: 07/04/2021 5:26:11 PM By: [name] MD FACS Signed: 07/04/2021 5:52:39 PM By: [name] RN Entered By: [name] on 07/04/2021 14:25:00 -------------------------------------------------------------------------------- HPI Details Patient Name: Date of Service: Erin Face D. 07/04/2021 1:15 PM Medical Record Number: [id number] Patient Account Number: [id number] Date of Birth/Sex: Treating RN: 03/11/1955 (66 y.o. 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 She tells [name] that she fell on the gravel driveway in July. She developed injuries on her distal lower leg which have not healed. She saw her primary physician on 11/15/2017 who noted her left shin injuries. Gave her antibiotics. At that point the wounds were almost circumferential however most were less than 1.5 cm. Weeping edema fluid was noted. She was referred here for evaluation. The patient has a history of chronic lower extremity edema. 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 She has had prior history with leg weeping fluid. She does not wear compression stockings. She is not doing anything specific to these wound areas. The patient has a history of obesity,  arthritis, peripheral vascular disease hypertension lower extremity edema and Schamberg's disease ABI in our clinic was 1.3 on the left 12/17/2017; patient readmitted to the clinic last week. 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 She also has lymphedema. Put her in 3 layer compression and silver alginate last week. She has 3 small wounds with depth just lateral to the tibia.  More problematically than this she has numerous shallow areas some of which are almost canal like in shape with tightly adherent painful debris. It would be very difficult and time- consuming to go through this and attempt to individually debride all these areas.. I changed her to collagen today to see if that would help with any of the surface debris on some of these wounds. Otherwise we will not be able to put this in compression we have to have someone change the dressing. The patient is not eligible for home health 12/25/17 on evaluation today patient actually appears to be doing rather well in regard to the ulcer on her lower extremity. Fortunately there does not appear to be evidence of infection at this time. She has been tolerating the dressing changes without complication. This includes the compression wrap. The only issue she had was that the wrap was initially placed over her bunion region which actually calls her some discomfort and pain. Other than that things seem to be going rather well. 01/01/2018 Seen today for follow-up and management of left lower extremity wound and lymphedema. T oday she presents with a new wound towards to the left lateral LE. Recently treated with a 7 day course of amoxicillin; reason for antibiotic dose is unknown at this time. Tolerating current treatment of collagen with 4- layer wraps. She obtained a venous reflux study on 12/25/17. Studies show on the right abnormal reflux times of the popliteal vein, great saphenous vein at the  saphenofemoral junction at the proximal thigh, great saphenous vein at the mid calf, and origin of the small saphenous vein.No superficial thrombosis. No deep vein thrombosis in the common femoral, femoral,and popliteal veins. Left abnormal reflex times as well of the common femoral vein, popliteal vein, and a great saphenous vein at the saphenofemoral junction, In great saphenous vein at the mid thigh w/o thrombosis. Has any issues or concerns during visit today. Recommended follow-up to vascular specialist due to abnormalities from the venous reflux study. Denies fever, pain, chills, dizziness, nausea, or vomiting. 01/08/18 upon evaluation today the patient actually seems to be showing some signs of improvement in my opinion at this point in regard to the lower extremity ulcerated areas. She still has a lot of drainage but fortunately nothing that appears to be too significant currently. I have been very happy with the overall progress I see today compared to where things were during the last evaluation that I had with her. Nonetheless she has not had her appointment with the vein specialist as of yet in fact we were able to get this approved for her today and confirmed with them she will be seeing them on December 26. Nonetheless in general I do feel like the compression wraps is doing well for her. 01/17/18; quite a bit of improvement since last time I saw this patient she has a small open area remaining on the left lateral calf and even smaller area medially. She has lymphedema chronic stasis changes with distal skin fibrosis. She has an appointment with vascular surgery later this month 01/24/2018; the patient's medial leg has closed. Still a small open area on the left lateral leg. She states that the 4 layer compression we put on last week was too tight and she had to take it off over a few days ago. We have had resultant increase in her lymphedema in the dorsal foot and a proximal calf.  Fortunately that does not seem to have resulted in any deterioration in her wounds  The patient is going to need compression stockings. We have given her measurements to phone elastic therapy in [place]. She has vascular surgery consult on December 26 02/07/18; she's had continuous contraction on the left lateral leg wound which is now very small. Currently using silver alginate under 3 layer compression She saw Dr. Trula Slade of vascular surgery on 02/06/18. It was noted that she had a normal reflux times in the popliteal vein, great saphenous vein at the saphenofemoral junction, great saphenous vein at the proximal thigh great saphenous vein at the mid calf and origin of the small saphenous vein. It was noted that she had significant reflux in the left saphenous veins with diameter measurements in the 0.7-0.8 cm range. It was felt she would benefit from laser ablation to help minimize the risk of ulcer recurrence. It was recommended that she wear 20-30 thigh-high compression stockings and have follow-up in 4-6 weeks 02/17/2018; I thought this lady would be healed however her compression slipped down and she developed increasing swelling and the wound is actually larger. 1/13; we had deterioration last week after the patient's compression slipped down and she developed periwound swelling. I increased her compression before layers we have been using silver alginate we are a lot better again today. She has her compression stockings in waiting 1/23; the patient's wounds are totally healed today. She has her stockings. This was almost circumferential skin damage. She follows up with Dr. Trula Slade of vascular surgery next Monday. READMISSION 02/21/2021 This is a now 66 year old woman that we had in clinic here discharging in January 2020 with wounds on her left calf chronic venous insufficiency. She was discharged with 30/40 stockings. It does not sound like she has worn stockings in about a year largely from  not being able to get them on herself. In November she developed new blisters on her legs an area laterally is opened into a fairly sizable wound. She has weeping posteriorly as well. She has been using Neosporin and Band-Aids. The patient did see Dr. Trula Slade in 2019 and 2020. He felt she might benefit from laser ablation in her left saphenous veins although because of the wound that healed I do not think he went through with it. She might benefit from seeing him again. Her ABI on the left is 1. 1/17; patient's wound on the posterior left calf is closed she has the 2 large areas last week. We put her in 4-layer compression for edema control is a lot better. Our intake nurse noted greenish drainage and odor. We have been using silver alginate 1/25; PCR culture I did have the substantial wound area on the left lateral lower leg showed staff aureus and group A strep. Low titers of coag negative staph which are probably skin contaminants. Resistance detected to tetracycline methicillin and macrolides. I gave her a starter kit of Nuzyra 150 mg x 3 for 2 days then 300 mg for a further 7 days. Marked odor considerable increase in surrounding erythema. We are using Iodoflex last week however I have changed to silver alginate with underlying Bactroban 2/1; she is completing her [place] tomorrow. The degree of erythema around the wounds looks a lot better. Odor has improved. I gave her silver alginate and Bactroban last week and changing her back to Iodoflex to continue with ongoing debridement 2/8; the periwound looks a lot better. Surface of the wound also looks somewhat better although there is still ongoing debridement to be done we have been using Iodoflex under compression. Primary dressing  and silver alginate 2/15; left posterior calf. Some improvement in the surface of the wound but still very gritty we have been using Iodoflex under compression. 04/05/2021: Left lateral posterior calf. She continues to  have significant amounts of drainage, some of which is probably comprised of the Iodoflex microbleeds. There is no odor to the drainage. The satellite lesion on the more posterior aspect of the calf is epithelializing nicely and has contracted quite a bit. There is robust granulation tissue in the dominant wound with minimal adherent slough. 04/12/2021: The satellite lesion has nearly closed. She continues to have good granulation tissue with minimal slough of the larger primary wound. She continues to have a fair amount of drainage, but I think this is less secondary to switching her dressing from Iodoflex to Prisma. 04/19/2021: The satellite lesion is almost completely epithelialized. The larger primary wound continues to contract with good granulation tissue and minimal slough. She is currently in [place] with compression. 04/26/2021: The satellite lesion has closed. The larger primary wound has contracted further and the granulation tissue is robust without being hypertrophic. Minimal slough is present. 05/03/2021: The satellite lesion remains closed. The larger primary wound has a bit of slough, but has also contracted further with good perimeter epithelialization. Good granulation tissue at the wound surface. There was some greenish drainage on the dressing when it was removed; it is not the blue-green typically associated with Pseudomonas aeruginosa, however. 05/10/2021: The primary wound continues to contract. There is minimal slough. The granulation tissue at 9:00 is a bit hypertrophic. No significant drainage. No odor. 05/17/2021: The wound is a little bit smaller today with good granulation tissue. Minimal slough. No significant drainage or odor. 05/24/2021: The wound continues to contract and has a nice base of granulation tissue. Small amount of slough. No concern for infection. 05/31/2021: For some reason, the wound measured slightly larger today but overall it still appears to be in good condition  with a nice base of granulation tissue and minimal slough. 06/07/2021: The wound is smaller today. Good granulation tissue and minimal slough. 06/14/2021: The wound is unchanged in size. She continues to accumulate some slough. Good granulation tissue on the surface. 06/20/2021: The wound is smaller today. Minimal slough with good granulation tissue. 06/27/2021: The wound on her left lateral leg is smaller today with just a bit of slough and eschar accumulation. Unfortunately, she has opened a new superficial wound on her right lower extremity. She has 3+ pitting edema to the knees on that leg and she does not wear compression stockings. 07/04/2021: In addition to the superficial wound on her right lower extremity that she opened up last week, she has opened 3 additional sites on the same leg. Her compression wraps were clearly not in correct position when she came to clinic today as they were at about the mid calf level rather than to the tibial tuberosity. She says that they slipped earlier and she had to come back on Friday to have them redone. The wound on her left lateral leg is perhaps slightly larger with some slough accumulation. Electronic Signature(s) Signed: 07/04/2021 4:22:17 PM By: [name] MD FACS Entered By: [name] on 07/04/2021 16:22:17 -------------------------------------------------------------------------------- Physical Exam Details Patient Name: Date of Service: KIYO, HEAL [name] 07/04/2021 1:15 PM Medical Record Number: [id number] Patient Account Number: [id number] Date of Birth/Sex: Treating RN: [birth year] (66 y.o. [name] Primary Care Provider: [name] Other Clinician: Referring Provider: Treating Provider/Extender: [name] in Treatment: [id number]  Constitutional . . . . No acute distress. Respiratory Normal work of breathing on room air. Notes 07/04/2021: She now has 4 small superficial wounds on her right lower  extremity. They are limited to skin breakdown and have not accumulated any slough or eschar. The wound on her left lateral calf is a little bit larger today with additional accumulated slough. The surface is a little bit gritty today. Electronic Signature(s) Signed: 07/04/2021 4:23:23 PM By: [name] MD FACS Entered By: [name] on 07/04/2021 16:23:23 -------------------------------------------------------------------------------- Physician Orders Details Patient Name: Date of Service: Erin Gerold [name] 07/04/2021 1:15 PM Medical Record Number: [id number] Patient Account Number: [id number] Date of Birth/Sex: Treating RN: 05-30-1955 (66 y.o. [name] Primary Care Provider: [name] Other Clinician: Referring Provider: Treating Provider/Extender: [name] in Treatment: 19 Verbal / Phone Orders: No Diagnosis Coding ICD-10 Coding Code Description 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 (idiopathic) with ulcer and inflammation of left lower extremity L97.828 Non-pressure chronic ulcer of other part of left lower leg with other specified severity L97.811 Non-pressure chronic ulcer of other part of right lower leg limited to breakdown of skin Follow-up Appointments ppointment in 1 week. - Dr. Celine Ahr - Room 3 Return A Bathing/ Shower/ Hygiene May shower with protection but do not get wound dressing(s) wet. - Ok to use Market [employer], can purchase at [facility], [facility], or [employer] Edema Control - Lymphedema / SCD / Other Elevate legs to the level of the heart or above for 30 minutes daily and/or when sitting, a frequency of: - throughout the day Avoid standing for long periods of time. Exercise regularly Compression stocking or Garment 20-30 mm/Hg pressure to: - Brochure for Elastic Therapy. Leg measurements included Wound Treatment Wound #3 - Lower Leg Wound Laterality: Left, Lateral Cleanser: Soap and Water 1 x Per Week/30 Days Discharge  Instructions: May shower and wash wound with dial antibacterial soap and water prior to dressing change. Cleanser: Wound Cleanser 1 x Per Week/30 Days Discharge Instructions: Cleanse the wound with wound cleanser prior to applying a clean dressing using gauze sponges, not tissue or cotton balls. Peri-Wound Care: Zinc Oxide Ointment 30g tube 1 x Per Week/30 Days Discharge Instructions: Apply Zinc Oxide to periwound with each dressing change Peri-Wound Care: Sween Lotion (Moisturizing lotion) 1 x Per Week/30 Days Discharge Instructions: Apply moisturizing lotion as directed Prim Dressing: Hydrofera Blue Classic Foam, 2x2 in ary 1 x Per Week/30 Days Discharge Instructions: Moisten with saline prior to applying to wound bed Secondary Dressing: Zetuvit Plus 4x8 in 1 x Per Week/30 Days Discharge Instructions: Apply over primary dressing as directed. Compression Wrap: Unnaboot w/Calamine, 4x10 (in/yd) 1 x Per Week/30 Days Discharge Instructions: Apply Unnaboot at top of leg Compression Wrap: CoFlex TLC XL 2-layer Compression System 4x7 (in/yd) 1 x Per Week/30 Days Discharge Instructions: Apply CoFlex 2-layer compression as directed. (alt for 4 layer) Wound #6 - Lower Leg Wound Laterality: Right, Anterior Cleanser: Soap and Water 1 x Per Week/30 Days Discharge Instructions: May shower and wash wound with dial antibacterial soap and water prior to dressing change. Cleanser: Wound Cleanser 1 x Per Week/30 Days Discharge Instructions: Cleanse the wound with wound cleanser prior to applying a clean dressing using gauze sponges, not tissue or cotton balls. Prim Dressing: KerraCel Ag Gelling Fiber Dressing, 2x2 in (silver alginate) 1 x Per Week/30 Days ary Discharge Instructions: Apply silver alginate to wound bed as instructed Secondary Dressing: Zetuvit Plus 4x8 in 1 x Per Week/30 Days Discharge Instructions: Apply  over primary dressing as directed. Secured With: Transpore Surgical Tape, 2x10 (in/yd) 1  x Per Week/30 Days Discharge Instructions: Secure dressing with tape as directed. Compression Wrap: Unnaboot w/Calamine, 4x10 (in/yd) 1 x Per Week/30 Days Discharge Instructions: Apply Unnaboot at top of the leg Compression Wrap: CoFlex TLC XL 2-layer Compression System 4x7 (in/yd) 1 x Per Week/30 Days Discharge Instructions: Apply CoFlex 2-layer compression as directed. (alt for 4 layer) Electronic Signature(s) Signed: 07/04/2021 5:26:11 PM By: [name] MD FACS Entered By: [name] on 07/04/2021 16:23:46 -------------------------------------------------------------------------------- Problem List Details Patient Name: Date of Service: Erin Gerold [name] 07/04/2021 1:15 PM Medical Record Number: [id number] Patient Account Number: [id number] Date of Birth/Sex: Treating RN: 1955/11/05 (66 y.o. [name] Primary Care Provider: [name] Other Clinician: Referring Provider: Treating Provider/Extender: [name] in Treatment: 19 Active Problems ICD-10 Encounter Code Description Active Date MDM Diagnosis 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 (idiopathic) with ulcer and inflammation of left 02/21/2021 No Yes lower extremity L97.828 Non-pressure chronic ulcer of other part of left lower leg with other specified 02/21/2021 No Yes severity L97.811 Non-pressure chronic ulcer of other part of right lower leg limited to breakdown 06/27/2021 No Yes of skin Inactive Problems ICD-10 Code Description Active Date Inactive Date L03.116 Cellulitis of left lower limb 03/08/2021 03/08/2021 Resolved Problems Electronic Signature(s) Signed: 07/04/2021 4:17:28 PM By: [name] MD FACS Entered By: [name] on 07/04/2021 16:17:28 -------------------------------------------------------------------------------- Progress Note Details Patient Name: Date of Service: Erin Gerold [name] 07/04/2021 1:15 PM Medical Record Number:  [id number] Patient Account Number: [id number] Date of Birth/Sex: Treating RN: 02/04/1956 (66 y.o. [name] Primary Care Provider: [name] Other Clinician: Referring Provider: Treating Provider/Extender: [name] in Treatment: 19 Subjective Chief Complaint Information obtained from Patient 04/19/2021: The patient is here for ongoing follow-up regarding 2 left lower extremity wounds. 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 She tells [name] that she fell on the gravel driveway in July. She developed injuries on her distal lower leg which have not healed. She saw her primary physician on 11/15/2017 who noted her left shin injuries. Gave her antibiotics. At that point the wounds were almost circumferential however most were less than 1.5 cm. Weeping edema fluid was noted. She was referred here for evaluation. The patient has a history of chronic lower extremity edema. 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 She has had prior history with leg weeping fluid. She does not wear compression stockings. She is not doing anything specific to these wound areas. 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 She also has lymphedema. Put her in 3 layer compression and silver alginate last week. She has 3 small wounds with depth just lateral to the tibia. More problematically than this she has numerous shallow areas some of which are almost canal like in shape with tightly adherent painful debris. It  would be very difficult and time- consuming to go through this and attempt to individually debride all these areas.. I changed her to collagen today to see if that would help with  any of the surface debris on some of these wounds. Otherwise we will not be able to put this in compression we have to have someone change the dressing. The patient is not eligible for home health 12/25/17 on evaluation today patient actually appears to be doing rather well in regard to the ulcer on her lower extremity. Fortunately there does not appear to be evidence of infection at this time. She has been tolerating the dressing changes without complication. This includes the compression wrap. The only issue she had was that the wrap was initially placed over her bunion region which actually calls her some discomfort and pain. Other than that things seem to be going rather well. 01/01/2018 Seen today for follow-up and management of left lower extremity wound and lymphedema. T oday she presents with a new wound towards to the left lateral LE. Recently treated with a 7 day course of amoxicillin; reason for antibiotic dose is unknown at this time. Tolerating current treatment of collagen with 4- layer wraps. She obtained a venous reflux study on 12/25/17. Studies show on the right abnormal reflux times of the popliteal vein, great saphenous vein at the saphenofemoral junction at the proximal thigh, great saphenous vein at the mid calf, and origin of the small saphenous vein.No superficial thrombosis. No deep vein thrombosis in the common femoral, femoral,and popliteal veins. Left abnormal reflex times as well of the common femoral vein, popliteal vein, and a great saphenous vein at the saphenofemoral junction, In great saphenous vein at the mid thigh w/o thrombosis. Has any issues or concerns during visit today. Recommended follow-up to vascular specialist due to abnormalities from the venous reflux study. Denies fever, pain,  chills, dizziness, nausea, or vomiting. 01/08/18 upon evaluation today the patient actually seems to be showing some signs of improvement in my opinion at this point in regard to the lower extremity ulcerated areas. She still has a lot of drainage but fortunately nothing that appears to be too significant currently. I have been very happy with the overall progress I see today compared to where things were during the last evaluation that I had with her. Nonetheless she has not had her appointment with the vein specialist as of yet in fact we were able to get this approved for her today and confirmed with them she will be seeing them on December 26. Nonetheless in general I do feel like the compression wraps is doing well for her. 01/17/18; quite a bit of improvement since last time I saw this patient she has a small open area remaining on the left lateral calf and even smaller area medially. She has lymphedema chronic stasis changes with distal skin fibrosis. She has an appointment with vascular surgery later this month 01/24/2018; the patient's medial leg has closed. Still a small open area on the left lateral leg. She states that the 4 layer compression we put on last week was too tight and she had to take it off over a few days ago. We have had resultant increase in her lymphedema in the dorsal foot and a proximal calf. Fortunately that does not seem to have resulted in any deterioration in her wounds The patient is going to need compression stockings. We have given her measurements to phone elastic therapy in [place]. She has vascular surgery consult on December 26 02/07/18; she's had continuous contraction on the left lateral leg wound which is now very small. Currently using silver alginate under 3 layer compression She saw Dr. Trula Slade of vascular  surgery on 02/06/18. It was noted that she had a normal reflux times in the popliteal vein, great saphenous vein at the saphenofemoral junction, great  saphenous vein at the proximal thigh great saphenous vein at the mid calf and origin of the small saphenous vein. It was noted that she had significant reflux in the left saphenous veins with diameter measurements in the 0.7-0.8 cm range. It was felt she would benefit from laser ablation to help minimize the risk of ulcer recurrence. It was recommended that she wear 20-30 thigh-high compression stockings and have follow-up in 4-6 weeks 02/17/2018; I thought this lady would be healed however her compression slipped down and she developed increasing swelling and the wound is actually larger. 1/13; we had deterioration last week after the patient's compression slipped down and she developed periwound swelling. I increased her compression before layers we have been using silver alginate we are a lot better again today. She has her compression stockings in waiting 1/23; the patient's wounds are totally healed today. She has her stockings. This was almost circumferential skin damage. She follows up with Dr. Trula Slade of vascular surgery next Monday. READMISSION 02/21/2021 This is a now 66 year old woman that we had in clinic here discharging in January 2020 with wounds on her left calf chronic venous insufficiency. She was discharged with 30/40 stockings. It does not sound like she has worn stockings in about a year largely from not being able to get them on herself. In November she developed new blisters on her legs an area laterally is opened into a fairly sizable wound. She has weeping posteriorly as well. She has been using Neosporin and Band-Aids. The patient did see Dr. Trula Slade in 2019 and 2020. He felt she might benefit from laser ablation in her left saphenous veins although because of the wound that healed I do not think he went through with it. She might benefit from seeing him again. Her ABI on the left is 1. 1/17; patient's wound on the posterior left calf is closed she has the 2 large areas last  week. We put her in 4-layer compression for edema control is a lot better. Our intake nurse noted greenish drainage and odor. We have been using silver alginate 1/25; PCR culture I did have the substantial wound area on the left lateral lower leg showed staff aureus and group A strep. Low titers of coag negative staph which are probably skin contaminants. Resistance detected to tetracycline methicillin and macrolides. I gave her a starter kit of Nuzyra 150 mg x 3 for 2 days then 300 mg for a further 7 days. Marked odor considerable increase in surrounding erythema. We are using Iodoflex last week however I have changed to silver alginate with underlying Bactroban 2/1; she is completing her [place] tomorrow. The degree of erythema around the wounds looks a lot better. Odor has improved. I gave her silver alginate and Bactroban last week and changing her back to Iodoflex to continue with ongoing debridement 2/8; the periwound looks a lot better. Surface of the wound also looks somewhat better although there is still ongoing debridement to be done we have been using Iodoflex under compression. Primary dressing and silver alginate 2/15; left posterior calf. Some improvement in the surface of the wound but still very gritty we have been using Iodoflex under compression. 04/05/2021: Left lateral posterior calf. She continues to have significant amounts of drainage, some of which is probably comprised of the Iodoflex microbleeds. There is no odor to the drainage.  The satellite lesion on the more posterior aspect of the calf is epithelializing nicely and has contracted quite a bit. There is robust granulation tissue in the dominant wound with minimal adherent slough. 04/12/2021: The satellite lesion has nearly closed. She continues to have good granulation tissue with minimal slough of the larger primary wound. She continues to have a fair amount of drainage, but I think this is less secondary to switching her  dressing from Iodoflex to Prisma. 04/19/2021: The satellite lesion is almost completely epithelialized. The larger primary wound continues to contract with good granulation tissue and minimal slough. She is currently in [place] with compression. 04/26/2021: The satellite lesion has closed. The larger primary wound has contracted further and the granulation tissue is robust without being hypertrophic. Minimal slough is present. 05/03/2021: The satellite lesion remains closed. The larger primary wound has a bit of slough, but has also contracted further with good perimeter epithelialization. Good granulation tissue at the wound surface. There was some greenish drainage on the dressing when it was removed; it is not the blue-green typically associated with Pseudomonas aeruginosa, however. 05/10/2021: The primary wound continues to contract. There is minimal slough. The granulation tissue at 9:00 is a bit hypertrophic. No significant drainage. No odor. 05/17/2021: The wound is a little bit smaller today with good granulation tissue. Minimal slough. No significant drainage or odor. 05/24/2021: The wound continues to contract and has a nice base of granulation tissue. Small amount of slough. No concern for infection. 05/31/2021: For some reason, the wound measured slightly larger today but overall it still appears to be in good condition with a nice base of granulation tissue and minimal slough. 06/07/2021: The wound is smaller today. Good granulation tissue and minimal slough. 06/14/2021: The wound is unchanged in size. She continues to accumulate some slough. Good granulation tissue on the surface. 06/20/2021: The wound is smaller today. Minimal slough with good granulation tissue. 06/27/2021: The wound on her left lateral leg is smaller today with just a bit of slough and eschar accumulation. Unfortunately, she has opened a new superficial wound on her right lower extremity. She has 3+ pitting edema to the knees on  that leg and she does not wear compression stockings. 07/04/2021: In addition to the superficial wound on her right lower extremity that she opened up last week, she has opened 3 additional sites on the same leg. Her compression wraps were clearly not in correct position when she came to clinic today as they were at about the mid calf level rather than to the tibial tuberosity. She says that they slipped earlier and she had to come back on Friday to have them redone. The wound on her left lateral leg is perhaps slightly larger with some slough accumulation. Patient History Information obtained from Patient. Family History Heart Disease - Mother,Father, Hypertension - Mother,Father, Kidney Disease - Mother, Thyroid Problems - Mother, No family history of Cancer, Diabetes, Hereditary Spherocytosis, Lung Disease, Seizures, Stroke, Tuberculosis. Social History Never smoker, Marital Status - Single, Alcohol Use - Never, Drug Use - No History, Caffeine Use - Daily - coffee. Medical History Eyes Patient has history of Cataracts Hematologic/Lymphatic Patient has history of Lymphedema Cardiovascular Patient has history of Hypertension, Peripheral Venous Disease Integumentary (Skin) Denies history of History of Burn Musculoskeletal Patient has history of Osteoarthritis Hospitalization/Surgery History - colostomy reversal. 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 Medical A Surgical History Notes nd Constitutional Symptoms (General Health) morbid obesity Cardiovascular schamberg disease, hyperlipidemia Gastrointestinal diverticulitis , h/o obstruction due  to diverticulitis , colostomy and colostomy reversal Endocrine hypothyroidism Objective Constitutional No acute distress. Vitals Time Taken: 1:55 PM, Height: 62 in, Weight: 226 lbs, BMI: 41.3, Temperature: 97.6 F, Pulse: 73 bpm, Respiratory Rate: 20 breaths/min, Blood Pressure: 132/77 mmHg. Respiratory Normal work of breathing on room  air. General Notes: 07/04/2021: She now has 4 small superficial wounds on her right lower extremity. They are limited to skin breakdown and have not accumulated any slough or eschar. The wound on her left lateral calf is a little bit larger today with additional accumulated slough. The surface is a little bit gritty today. Integumentary (Hair, Skin) Wound #3 status is Open. Original cause of wound was Blister. The date acquired was: 12/13/2020. The wound has been in treatment 19 weeks. The wound is located on the Left,Lateral Lower Leg. The wound measures 1.5cm length x 0.8cm width x 0.2cm depth; 0.942cm^2 area and 0.188cm^3 volume. There is Fat Layer (Subcutaneous Tissue) exposed. There is no tunneling or undermining noted. There is a medium amount of serosanguineous drainage noted. The wound margin is flat and intact. There is small (1-33%) red, pink granulation within the wound bed. There is a large (67-100%) amount of necrotic tissue within the wound bed including Adherent Slough. Wound #6 status is Open. Original cause of wound was Gradually Appeared. The date acquired was: 06/20/2021. The wound has been in treatment 1 weeks. The wound is located on the Right,Anterior Lower Leg. The wound measures 1cm length x 1cm width x 0.1cm depth; 0.785cm^2 area and 0.079cm^3 volume. There is Fat Layer (Subcutaneous Tissue) exposed. There is no tunneling or undermining noted. There is a medium amount of serous drainage noted. There is large (67-100%) pink granulation within the wound bed. There is no necrotic tissue within the wound bed. Assessment Active Problems ICD-10 Chronic venous hypertension (idiopathic) with ulcer and inflammation of left lower extremity Non-pressure chronic ulcer of other part of left lower leg with other specified severity Non-pressure chronic ulcer of other part of right lower leg limited to breakdown of skin Procedures Wound #3 Pre-procedure diagnosis of Wound #3 is a Venous Leg  Ulcer located on the Left,Lateral Lower Leg .Severity of Tissue Pre Debridement is: Fat layer exposed. There was a Excisional Skin/Subcutaneous Tissue Debridement with a total area of 1.2 sq cm performed by [name], MD. With the following instrument(s): Curette to remove Non-Viable tissue/material. Material removed includes Subcutaneous Tissue and Slough and. No specimens were taken. A time out was conducted at 14:22, prior to the start of the procedure. A Minimum amount of bleeding was controlled with Pressure. The procedure was tolerated well with a pain level of 0 throughout and a pain level of 0 following the procedure. Post Debridement Measurements: 1.5cm length x 0.8cm width x 0.2cm depth; 0.188cm^3 volume. Character of Wound/Ulcer Post Debridement is improved. Severity of Tissue Post Debridement is: Fat layer exposed. Post procedure Diagnosis Wound #3: Same as Pre-Procedure Pre-procedure diagnosis of Wound #3 is a Venous Leg Ulcer located on the Left,Lateral Lower Leg . There was a Double Layer Compression Therapy Procedure by [name], RN. Post procedure Diagnosis Wound #3: Same as Pre-Procedure Wound #6 Pre-procedure diagnosis of Wound #6 is a Venous Leg Ulcer located on the Right,Anterior Lower Leg . There was a Double Layer Compression Therapy Procedure by [name], RN. Post procedure Diagnosis Wound #6: Same as Pre-Procedure Plan Follow-up Appointments: Return Appointment in 1 week. - Dr. Celine Ahr - Room 3 Bathing/ Shower/ Hygiene: May shower with protection but do  not get wound dressing(s) wet. - Ok to use Market researcher, can purchase at [facility], [facility], or [employer] Edema Control - Lymphedema / SCD / Other: Elevate legs to the level of the heart or above for 30 minutes daily and/or when sitting, a frequency of: - throughout the day Avoid standing for long periods of time. Exercise regularly Compression stocking or Garment 20-30 mm/Hg pressure to: - Brochure for  Elastic Therapy. Leg measurements included WOUND #3: - Lower Leg Wound Laterality: Left, Lateral Cleanser: Soap and Water 1 x Per Week/30 Days Discharge Instructions: May shower and wash wound with dial antibacterial soap and water prior to dressing change. Cleanser: Wound Cleanser 1 x Per Week/30 Days Discharge Instructions: Cleanse the wound with wound cleanser prior to applying a clean dressing using gauze sponges, not tissue or cotton balls. Peri-Wound Care: Zinc Oxide Ointment 30g tube 1 x Per Week/30 Days Discharge Instructions: Apply Zinc Oxide to periwound with each dressing change Peri-Wound Care: Sween Lotion (Moisturizing lotion) 1 x Per Week/30 Days Discharge Instructions: Apply moisturizing lotion as directed Prim Dressing: Hydrofera Blue Classic Foam, 2x2 in 1 x Per Week/30 Days ary Discharge Instructions: Moisten with saline prior to applying to wound bed Secondary Dressing: Zetuvit Plus 4x8 in 1 x Per Week/30 Days Discharge Instructions: Apply over primary dressing as directed. Com pression Wrap: Unnaboot w/Calamine, 4x10 (in/yd) 1 x Per Week/30 Days Discharge Instructions: Apply Unnaboot at top of leg Com pression Wrap: CoFlex TLC XL 2-layer Compression System 4x7 (in/yd) 1 x Per Week/30 Days Discharge Instructions: Apply CoFlex 2-layer compression as directed. (alt for 4 layer) WOUND #6: - Lower Leg Wound Laterality: Right, Anterior Cleanser: Soap and Water 1 x Per Week/30 Days Discharge Instructions: May shower and wash wound with dial antibacterial soap and water prior to dressing change. Cleanser: Wound Cleanser 1 x Per Week/30 Days Discharge Instructions: Cleanse the wound with wound cleanser prior to applying a clean dressing using gauze sponges, not tissue or cotton balls. Prim Dressing: KerraCel Ag Gelling Fiber Dressing, 2x2 in (silver alginate) 1 x Per Week/30 Days ary Discharge Instructions: Apply silver alginate to wound bed as instructed Secondary Dressing:  Zetuvit Plus 4x8 in 1 x Per Week/30 Days Discharge Instructions: Apply over primary dressing as directed. Secured With: Transpore Surgical T ape, 2x10 (in/yd) 1 x Per Week/30 Days Discharge Instructions: Secure dressing with tape as directed. Com pression Wrap: Unnaboot w/Calamine, 4x10 (in/yd) 1 x Per Week/30 Days Discharge Instructions: Apply Unnaboot at top of the leg Com pression Wrap: CoFlex TLC XL 2-layer Compression System 4x7 (in/yd) 1 x Per Week/30 Days Discharge Instructions: Apply CoFlex 2-layer compression as directed. (alt for 4 layer) 07/04/2021: She now has 4 small superficial wounds on her right lower extremity. They are limited to skin breakdown and have not accumulated any slough or eschar. The wound on her left lateral calf is a little bit larger today with additional accumulated slough. The surface is a little bit gritty today. I used a curette to debride the slough from the left lateral calf wound. We will continue using silver alginate on the right leg to all wounds and Hydrofera Blue to the left leg wounds. Coflex compression wraps to both legs with Unna boot first layer at the top to try and keep them in place. Follow-up in 1 week. Electronic Signature(s) Signed: 07/04/2021 4:24:42 PM By: [name] MD FACS Entered By: [name] on 07/04/2021 16:24:41 -------------------------------------------------------------------------------- HxROS Details Patient Name: Date of Service: Erin Gerold [name] 07/04/2021 1:15  PM Medical Record Number: [id number] Patient Account Number: [id number] Date of Birth/Sex: Treating RN: [birth year] (66 y.o. [name] Primary Care Provider: [name] Other Clinician: Referring Provider: Treating Provider/Extender: [name] in Treatment: 19 Information Obtained From Patient Constitutional Symptoms (General Health) Medical History: Past Medical History Notes: morbid  obesity Eyes Medical History: Positive for: Cataracts Hematologic/Lymphatic Medical History: Positive for: Lymphedema Cardiovascular Medical History: Positive for: Hypertension; Peripheral Venous Disease Past Medical History Notes: schamberg disease, hyperlipidemia Gastrointestinal Medical History: Past Medical History Notes: diverticulitis , h/o obstruction due to diverticulitis , colostomy and colostomy reversal Endocrine Medical History: Past Medical History Notes: hypothyroidism Integumentary (Skin) Medical History: Negative for: History of Burn Musculoskeletal Medical History: Positive for: Osteoarthritis HBO Extended History Items Eyes: Cataracts Immunizations Pneumococcal Vaccine: Received Pneumococcal Vaccination: Yes Received Pneumococcal Vaccination On or After 60th Birthday: Yes Implantable Devices None Hospitalization / Surgery History Type of Hospitalization/Surgery colostomy reversal 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 Family and Social History Cancer: No; Diabetes: No; Heart Disease: Yes - Mother,Father; Hereditary Spherocytosis: No; Hypertension: Yes - Mother,Father; Kidney Disease: Yes - Mother; Lung Disease: No; Seizures: No; Stroke: No; Thyroid Problems: Yes - Mother; Tuberculosis: No; Never smoker; Marital Status - Single; Alcohol Use: Never; Drug Use: No History; Caffeine Use: Daily - coffee; Financial Concerns: No; Food, Clothing or Shelter Needs: No; Support System Lacking: No; Transportation Concerns: No Engineer, maintenance) Signed: 07/04/2021 5:26:11 PM By: [name] MD FACS Signed: 07/04/2021 5:52:39 PM By: [name] RN Entered By: [name] on 07/04/2021 16:22:23 -------------------------------------------------------------------------------- SuperBill Details Patient Name: Date of Service: Erin Face D. 07/04/2021 Medical Record Number: [id number] Patient Account Number: [id number] Date of Birth/Sex: Treating  RN: 11/15/1955 (66 y.o. [name] Primary Care Provider: [name] Other Clinician: Referring Provider: Treating Provider/Extender: [name] in Treatment: 19 Diagnosis Coding ICD-10 Codes Code Description [phone] Chronic venous hypertension (idiopathic) with ulcer and inflammation of left lower extremity L97.828 Non-pressure chronic ulcer of other part of left lower leg with other specified severity L97.811 Non-pressure chronic ulcer of other part of right lower leg limited to breakdown of skin Facility Procedures CPT4 Code: [id number] Description: 96789 - DEB SUBQ TISSUE 20 SQ CM/< ICD-10 Diagnosis Description 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 (idiopathic) with ulcer and inflammation of left l L97.828 Non-pressure chronic ulcer of other part of left lower leg with other specifie Modifier: ower extremity d severity Quantity: 1 Physician Procedures : CPT4 Code Description Modifier [id number] - WC PHYS LEVEL 3 - EST PT 25 ICD-10 Diagnosis Description L97.828 Non-pressure chronic ulcer of other part of left lower leg with other specified severity L97.811 Non-pressure chronic ulcer of other part of  right lower leg limited to breakdown of skin 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 (idiopathic) with ulcer and inflammation of left lower extremity Quantity: 1 : [id number] - WC PHYS SUBQ TISS 20 SQ CM ICD-10 Diagnosis Description 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 (idiopathic) with ulcer and inflammation of left lower extremity L97.828 Non-pressure chronic ulcer of other part of left lower leg with other  specified severity Quantity: 1 Electronic Signature(s) Signed: 07/04/2021 4:25:03 PM By: [name] MD FACS Entered By: [name] on 07/04/2021 16:25:03

## 2021-07-04 NOTE — Progress Notes (Signed)
[name], [name] ([id number]) Visit Report for 07/04/2021 Arrival Information Details Patient Name: Date of Service: [name], [name] 07/04/2021 1:15 PM Medical Record Number: [id number] Patient Account Number: [id number] Date of Birth/Sex: Treating RN: 02-22-1955 (66 y.o. [name] Primary Care Anyelo Mccue: [name] Other Clinician: Referring Sakib Noguez: Treating Tiffay Pinette/Extender: [name] in Treatment: 28 Visit Information History Since Last Visit Added or deleted any medications: No Patient Arrived: Cane Any new allergies or adverse reactions: No Arrival Time: 13:51 Had a fall or experienced change in No Accompanied By: alone activities of daily living that may affect Transfer Assistance: None risk of falls: Patient Identification Verified: Yes Signs or symptoms of abuse/neglect since last visito No Patient Requires Transmission-Based Precautions: No Hospitalized since last visit: No Patient Has Alerts: No Implantable device outside of the clinic excluding No cellular tissue based products placed in the center since last visit: Has Dressing in Place as Prescribed: Yes Has Compression in Place as Prescribed: Yes Pain Present Now: No Electronic Signature(s) Signed: 07/04/2021 5:52:39 PM By: [name] RN Entered By: [name] on 07/04/2021 13:51:48 -------------------------------------------------------------------------------- Compression Therapy Details Patient Name: Date of Service: [name], [name]. 07/04/2021 1:15 PM Medical Record Number: [id number] Patient Account Number: [id number] Date of Birth/Sex: Treating RN: [birth year] (66 y.o. [name] Primary Care Tilia Faso: [name] Other Clinician: Referring Samya Siciliano: Treating Nanie Dunkleberger/Extender: [name] in Treatment: 19 Compression Therapy Performed for Wound Assessment: Wound #3 Left,Lateral Lower Leg Performed By:  Clinician [name], RN Compression Type: Double Layer Post Procedure Diagnosis Same as Pre-procedure Electronic Signature(s) Signed: 07/04/2021 5:52:39 PM By: [name] RN Entered By: [name] on 07/04/2021 14:25:57 -------------------------------------------------------------------------------- Compression Therapy Details Patient Name: Date of Service: [name], [name] 07/04/2021 1:15 PM Medical Record Number: [id number] Patient Account Number: [id number] Date of Birth/Sex: Treating RN: 26-Sep-1955 (66 y.o. [name] Primary Care Amilya Haver: [name] Other Clinician: Referring Murlin Schrieber: Treating Yerania Chamorro/Extender: [name] in Treatment: 19 Compression Therapy Performed for Wound Assessment: Wound #6 Right,Anterior Lower Leg Performed By: Clinician [name], RN Compression Type: Double Layer Post Procedure Diagnosis Same as Pre-procedure Electronic Signature(s) Signed: 07/04/2021 5:52:39 PM By: [name] RN Entered By: [name] on 07/04/2021 14:29:43 -------------------------------------------------------------------------------- Encounter Discharge Information Details Patient Name: Date of Service: Erin Face D. 07/04/2021 1:15 PM Medical Record Number: [id number] Patient Account Number: [id number] Date of Birth/Sex: Treating RN: 1956/01/26 (66 y.o. [name] Primary Care Kiyon Fidalgo: [name] Other Clinician: Referring Denney Shein: Treating Micaylah Bertucci/Extender: [name] in Treatment: 19 Encounter Discharge Information Items Post Procedure Vitals Discharge Condition: Stable Temperature (F): 97.6 Ambulatory Status: Cane Pulse (bpm): 73 Discharge Destination: Home Respiratory Rate (breaths/min): 20 Transportation: Private Auto Blood Pressure (mmHg): 132/77 Accompanied By: self Schedule Follow-up Appointment: Yes Clinical Summary of Care: Patient  Declined Electronic Signature(s) Signed: 07/04/2021 5:52:39 PM By: [name] RN Entered By: [name] on 07/04/2021 17:51:31 -------------------------------------------------------------------------------- Lower Extremity Assessment Details Patient Name: Date of Service: [name], [name] 07/04/2021 1:15 PM Medical Record Number: [id number] Patient Account Number: [id number] Date of Birth/Sex: Treating RN: Mar 17, 1955 (66 y.o. [name] Primary Care Reginna Sermeno: [name] Other Clinician: Referring Knox Cervi: Treating Ineze Serrao/Extender: [name] in Treatment: 19 Edema Assessment Assessed: [Left: No] [Right: No] Edema: [Left: Ye] [Right: s] Calf Left: Right: Point of Measurement: 29 cm From Medial Instep 42.5 cm 45 cm Ankle Left: Right: Point of Measurement: 10 cm From Medial Instep  24.5 cm 27 cm Electronic Signature(s) Signed: 07/04/2021 5:52:39 PM By: [name] RN Entered By: [name] on 07/04/2021 14:06:58 -------------------------------------------------------------------------------- Multi Wound Chart Details Patient Name: Date of Service: Erin Face D. 07/04/2021 1:15 PM Medical Record Number: [id number] Patient Account Number: [id number] Date of Birth/Sex: Treating RN: 1955-08-11 (67 y.o. [name] Primary Care Devarius Nelles: [name] Other Clinician: Referring Adriena Manfre: Treating [name]/Extender: [name] in Treatment: 19 Vital Signs Height(in): 76 Pulse(bpm): 66 Weight(lbs): 5 Blood Pressure(mmHg): 132/77 Body Mass Index(BMI): 41.3 Temperature(F): 97.6 Respiratory Rate(breaths/min): 20 Photos: [N/A:N/A] Left, Lateral Lower Leg Right, Anterior Lower Leg N/A Wound Location: Blister Gradually Appeared N/A Wounding Event: Venous Leg Ulcer Venous Leg Ulcer N/A Primary Etiology: Cataracts, Lymphedema, Cataracts, Lymphedema, N/A Comorbid  History: Hypertension, Peripheral Venous Hypertension, Peripheral Venous Disease, Osteoarthritis Disease, Osteoarthritis 12/13/2020 06/20/2021 N/A Date Acquired: 19 1 N/A Weeks of Treatment: Open Open N/A Wound Status: No No N/A Wound Recurrence: No Yes N/A Clustered Wound: 1.5x0.8x0.2 1x1x0.1 N/A Measurements L x W x D (cm) 0.942 0.785 N/A A (cm) : rea 0.188 0.079 N/A Volume (cm) : 96.80% -2432.30% N/A % Reduction in Area: 96.80% -2533.30% N/A % Reduction in Volume: Full Thickness Without Exposed Full Thickness Without Exposed N/A Classification: Support Structures Support Structures Medium Medium N/A Exudate Amount: Serosanguineous Serous N/A Exudate Type: red, brown amber N/A Exudate Color: Flat and Intact N/A N/A Wound Margin: Small (1-33%) Large (67-100%) N/A Granulation Amount: Red, Pink Pink N/A Granulation Quality: Large (67-100%) None Present (0%) N/A Necrotic Amount: Fat Layer (Subcutaneous Tissue): Yes Fat Layer (Subcutaneous Tissue): Yes N/A Exposed Structures: Fascia: No Fascia: No Tendon: No Tendon: No Muscle: No Muscle: No Joint: No Joint: No Bone: No Bone: No Medium (34-66%) Medium (34-66%) N/A Epithelialization: Debridement - Excisional N/A N/A Debridement: Pre-procedure Verification/Time Out 14:22 N/A N/A Taken: Subcutaneous, Slough N/A N/A Tissue Debrided: Skin/Subcutaneous Tissue N/A N/A Level: 1.2 N/A N/A Debridement A (sq cm): rea Curette N/A N/A Instrument: Minimum N/A N/A Bleeding: Pressure N/A N/A Hemostasis A chieved: 0 N/A N/A Procedural Pain: 0 N/A N/A Post Procedural Pain: Procedure was tolerated well N/A N/A Debridement Treatment Response: 1.5x0.8x0.2 N/A N/A Post Debridement Measurements L x W x D (cm) 0.188 N/A N/A Post Debridement Volume: (cm) Compression Therapy Compression Therapy N/A Procedures Performed: Debridement Treatment Notes Electronic Signature(s) Signed: 07/04/2021 4:17:34 PM By:  [name] MD FACS Signed: 07/04/2021 5:52:39 PM By: [name] RN Entered By: [name] on 07/04/2021 16:17:34 -------------------------------------------------------------------------------- Multi-Disciplinary Care Plan Details Patient Name: Date of Service: Erin Face D. 07/04/2021 1:15 PM Medical Record Number: [id number] Patient Account Number: [id number] Date of Birth/Sex: Treating RN: Mar 20, 1955 (66 y.o. [name] Primary Care Kajuan Guyton: [name] Other Clinician: Referring Jaely Silman: Treating Takumi Din/Extender: [name] in Treatment: 19 Multidisciplinary Care Plan reviewed with physician Active Inactive Venous Leg Ulcer Nursing Diagnoses: Actual venous Insuffiency (use after diagnosis is confirmed) Goals: Patient will maintain optimal edema control Date Initiated: 02/21/2021 Target Resolution Date: 09/08/2021 Goal Status: Active Patient/caregiver will verbalize understanding of disease process and disease management Date Initiated: 02/21/2021 Date Inactivated: 03/29/2021 Target Resolution Date: 03/24/2021 Goal Status: Met Interventions: Assess peripheral edema status every visit. Compression as ordered Provide education on venous insufficiency Notes: Wound/Skin Impairment Nursing Diagnoses: Impaired tissue integrity Knowledge deficit related to ulceration/compromised skin integrity Goals: Patient/caregiver will verbalize understanding of skin care regimen Date Initiated: 02/21/2021 Target Resolution Date: 09/01/2021 Goal Status: Active Ulcer/skin breakdown will have a volume reduction of 30% by week  4 Date Initiated: 02/21/2021 Date Inactivated: 03/29/2021 Target Resolution Date: 03/24/2021 Goal Status: Unmet Unmet Reason: infection Ulcer/skin breakdown will have a volume reduction of 50% by week 8 Date Initiated: 03/29/2021 Date Inactivated: 04/19/2021 Target Resolution Date: 04/21/2021 Goal Status:  Met Interventions: Assess patient/caregiver ability to obtain necessary supplies Assess patient/caregiver ability to perform ulcer/skin care regimen upon admission and as needed Assess ulceration(s) every visit Provide education on ulcer and skin care Notes: Electronic Signature(s) Signed: 07/04/2021 5:52:39 PM By: [name] RN Entered By: [name] on 07/04/2021 17:47:55 -------------------------------------------------------------------------------- Pain Assessment Details Patient Name: Date of Service: DAYSIA, VANDENBOOM 07/04/2021 1:15 PM Medical Record Number: [id number] Patient Account Number: [id number] Date of Birth/Sex: Treating RN: 1955-11-09 (66 y.o. [name] Primary Care Joletta Manner: [name] Other Clinician: Referring Kin Galbraith: Treating Konica Stankowski/Extender: [name] in Treatment: 19 Active Problems Location of Pain Severity and Description of Pain Patient Has Paino No Site Locations Pain Management and Medication Current Pain Management: Electronic Signature(s) Signed: 07/04/2021 5:52:39 PM By: [name] RN Entered By: [name] on 07/04/2021 13:58:51 -------------------------------------------------------------------------------- Patient/Caregiver Education Details Patient Name: Date of Service: [name] 5/23/2023andnbsp1:15 PM Medical Record Number: [id number] Patient Account Number: [id number] Date of Birth/Gender: Treating RN: 1955-03-01 (66 y.o. [name] Primary Care Physician: [name] Other Clinician: Referring Physician: Treating Physician/Extender: [name] in Treatment: 19 Education Assessment Education Provided To: Patient Education Topics Provided Wound/Skin Impairment: Methods: Explain/Verbal Responses: Return demonstration correctly Electronic Signature(s) Signed: 07/04/2021 5:52:39 PM By: [name] RN Entered By:  [name] on 07/04/2021 17:48:10 -------------------------------------------------------------------------------- Wound Assessment Details Patient Name: Date of Service: [name], [name] 07/04/2021 1:15 PM Medical Record Number: [id number] Patient Account Number: [id number] Date of Birth/Sex: Treating RN: 12/19/1955 (66 y.o. [name] Primary Care Emmalyne Giacomo: [name] Other Clinician: Referring Diamonique Ruedas: Treating Remi Rester/Extender: [name] in Treatment: 19 Wound Status Wound Number: 3 Primary Venous Leg Ulcer Etiology: Wound Location: Left, Lateral Lower Leg Wound Open Wounding Event: Blister Status: Date Acquired: 12/13/2020 Comorbid Cataracts, Lymphedema, Hypertension, Peripheral Venous Weeks Of Treatment: 19 History: Disease, Osteoarthritis Clustered Wound: No Photos Wound Measurements Length: (cm) 1.5 Width: (cm) 0.8 Depth: (cm) 0.2 Area: (cm) 0.942 Volume: (cm) 0.188 % Reduction in Area: 96.8% % Reduction in Volume: 96.8% Epithelialization: Medium (34-66%) Tunneling: No Undermining: No Wound Description Classification: Full Thickness Without Exposed Support Structures Wound Margin: Flat and Intact Exudate Amount: Medium Exudate Type: Serosanguineous Exudate Color: red, brown Foul Odor After Cleansing: No Slough/Fibrino Yes Wound Bed Granulation Amount: Small (1-33%) Exposed Structure Granulation Quality: Red, Pink Fascia Exposed: No Necrotic Amount: Large (67-100%) Fat Layer (Subcutaneous Tissue) Exposed: Yes Necrotic Quality: Adherent Slough Tendon Exposed: No Muscle Exposed: No Joint Exposed: No Bone Exposed: No Treatment Notes Wound #3 (Lower Leg) Wound Laterality: Left, Lateral Cleanser Soap and Water Discharge Instruction: May shower and wash wound with dial antibacterial soap and water prior to dressing change. Wound Cleanser Discharge Instruction: Cleanse the wound with wound cleanser prior  to applying a clean dressing using gauze sponges, not tissue or cotton balls. Peri-Wound Care Zinc Oxide Ointment 30g tube Discharge Instruction: Apply Zinc Oxide to periwound with each dressing change Sween Lotion (Moisturizing lotion) Discharge Instruction: Apply moisturizing lotion as directed Topical Primary Dressing Hydrofera Blue Classic Foam, 2x2 in Discharge Instruction: Moisten with saline prior to applying to wound bed Secondary Dressing Zetuvit Plus 4x8 in Discharge Instruction: Apply over primary dressing as directed. Secured With Compression Wrap  Unnaboot w/Calamine, 4x10 (in/yd) Discharge Instruction: Apply Unnaboot at top of leg CoFlex TLC XL 2-layer Compression System 4x7 (in/yd) Discharge Instruction: Apply CoFlex 2-layer compression as directed. (alt for 4 layer) Compression Stockings Add-Ons Electronic Signature(s) Signed: 07/04/2021 5:52:39 PM By: [name] RN Entered By: [name] on 07/04/2021 14:14:35 -------------------------------------------------------------------------------- Wound Assessment Details Patient Name: Date of Service: [name], [name] 07/04/2021 1:15 PM Medical Record Number: [id number] Patient Account Number: [id number] Date of Birth/Sex: Treating RN: [birth year] (66 y.o. [name] Primary Care Jay Kempe: [name] Other Clinician: Referring Keyonta Barradas: Treating Antoinette Borgwardt/Extender: [name] in Treatment: 19 Wound Status Wound Number: 6 Primary Venous Leg Ulcer Etiology: Wound Location: Right, Anterior Lower Leg Wound Open Wounding Event: Gradually Appeared Status: Date Acquired: 06/20/2021 Comorbid Cataracts, Lymphedema, Hypertension, Peripheral Venous Weeks Of Treatment: 1 History: Disease, Osteoarthritis Clustered Wound: Yes Photos Wound Measurements Length: (cm) 1 Width: (cm) 1 Depth: (cm) 0.1 Area: (cm) 0.785 Volume: (cm) 0.079 % Reduction in Area: -2432.3% %  Reduction in Volume: -2533.3% Epithelialization: Medium (34-66%) Tunneling: No Undermining: No Wound Description Classification: Full Thickness Without Exposed Support Structures Exudate Amount: Medium Exudate Type: Serous Exudate Color: amber Foul Odor After Cleansing: No Slough/Fibrino No Wound Bed Granulation Amount: Large (67-100%) Exposed Structure Granulation Quality: Pink Fascia Exposed: No Necrotic Amount: None Present (0%) Fat Layer (Subcutaneous Tissue) Exposed: Yes Tendon Exposed: No Muscle Exposed: No Joint Exposed: No Bone Exposed: No Treatment Notes Wound #6 (Lower Leg) Wound Laterality: Right, Anterior Cleanser Soap and Water Discharge Instruction: May shower and wash wound with dial antibacterial soap and water prior to dressing change. Wound Cleanser Discharge Instruction: Cleanse the wound with wound cleanser prior to applying a clean dressing using gauze sponges, not tissue or cotton balls. Peri-Wound Care Topical Primary Dressing KerraCel Ag Gelling Fiber Dressing, 2x2 in (silver alginate) Discharge Instruction: Apply silver alginate to wound bed as instructed Secondary Dressing Zetuvit Plus 4x8 in Discharge Instruction: Apply over primary dressing as directed. Secured With Transpore Surgical Tape, 2x10 (in/yd) Discharge Instruction: Secure dressing with tape as directed. Compression Wrap Unnaboot w/Calamine, 4x10 (in/yd) Discharge Instruction: Apply Unnaboot at top of the leg CoFlex TLC XL 2-layer Compression System 4x7 (in/yd) Discharge Instruction: Apply CoFlex 2-layer compression as directed. (alt for 4 layer) Compression Stockings Add-Ons Electronic Signature(s) Signed: 07/04/2021 5:52:39 PM By: [name] RN Entered By: [name] on 07/04/2021 14:15:06 -------------------------------------------------------------------------------- Vitals Details Patient Name: Date of Service: Erin Gerold [name] 07/04/2021 1:15 PM Medical  Record Number: [id number] Patient Account Number: [id number] Date of Birth/Sex: Treating RN: [birth year] (66 y.o. [name] Primary Care Dorothe Elmore: [name] Other Clinician: Referring Liv Rallis: Treating Rease Wence/Extender: [name] in Treatment: 19 Vital Signs Time Taken: 13:55 Temperature (F): 97.6 Height (in): 62 Pulse (bpm): 73 Weight (lbs): 226 Respiratory Rate (breaths/min): 20 Body Mass Index (BMI): 41.3 Blood Pressure (mmHg): 132/77 Reference Range: 80 - 120 mg / dl Electronic Signature(s) Signed: 07/04/2021 5:52:39 PM By: [name] RN Entered By: [name] on 07/04/2021 13:57:24

## 2021-07-11 ENCOUNTER — Encounter (HOSPITAL_BASED_OUTPATIENT_CLINIC_OR_DEPARTMENT_OTHER): Payer: 59 | Admitting: General Surgery

## 2021-07-11 DIAGNOSIS — I87332 Chronic venous hypertension (idiopathic) with ulcer and inflammation of left lower extremity: Secondary | ICD-10-CM | POA: Diagnosis not present

## 2021-07-11 DIAGNOSIS — I87333 Chronic venous hypertension (idiopathic) with ulcer and inflammation of bilateral lower extremity: Secondary | ICD-10-CM | POA: Diagnosis not present

## 2021-07-11 DIAGNOSIS — L97828 Non-pressure chronic ulcer of other part of left lower leg with other specified severity: Secondary | ICD-10-CM | POA: Diagnosis not present

## 2021-07-11 DIAGNOSIS — L97812 Non-pressure chronic ulcer of other part of right lower leg with fat layer exposed: Secondary | ICD-10-CM | POA: Diagnosis not present

## 2021-07-11 DIAGNOSIS — I872 Venous insufficiency (chronic) (peripheral): Secondary | ICD-10-CM | POA: Diagnosis not present

## 2021-07-11 DIAGNOSIS — L97811 Non-pressure chronic ulcer of other part of right lower leg limited to breakdown of skin: Secondary | ICD-10-CM | POA: Diagnosis not present

## 2021-07-11 DIAGNOSIS — L97822 Non-pressure chronic ulcer of other part of left lower leg with fat layer exposed: Secondary | ICD-10-CM | POA: Diagnosis not present

## 2021-07-11 NOTE — Progress Notes (Signed)
[name], [name] ([id number]) Visit Report for 07/11/2021 Arrival Information Details Patient Name: Date of Service: [name], [name] 07/11/2021 8:45 A M Medical Record Number: [id number] Patient Account Number: [id number] Date of Birth/Sex: Treating RN: May 15, 1955 (66 y.o. [name] Primary Care Phiona Ramnauth: [name] Other Clinician: Referring Jaqwan Wieber: Treating Brittay Mogle/Extender: [name] in Treatment: 64 Visit Information History Since Last Visit Added or deleted any medications: No Patient Arrived: [name] Any new allergies or adverse reactions: No Arrival Time: 08:56 Had a fall or experienced change in No Accompanied By: self activities of daily living that may affect Transfer Assistance: None risk of falls: Patient Identification Verified: Yes Signs or symptoms of abuse/neglect since last visito No Patient Requires Transmission-Based Precautions: No Hospitalized since last visit: No Patient Has Alerts: No Implantable device outside of the clinic excluding No cellular tissue based products placed in the center since last visit: Has Dressing in Place as Prescribed: Yes Has Compression in Place as Prescribed: Yes Pain Present Now: No Electronic Signature(s) Signed: 07/11/2021 4:40:05 PM By: [name] RN Entered By: [name] on 07/11/2021 08:56:57 -------------------------------------------------------------------------------- Compression Therapy Details Patient Name: Date of Service: Erin Face D. 07/11/2021 8:45 A M Medical Record Number: [id number] Patient Account Number: [id number] Date of Birth/Sex: Treating RN: Nov 21, 1955 (66 y.o. [name] Primary Care Tennie Grussing: [name] Other Clinician: Referring Juleah Paradise: Treating Tamar Miano/Extender: [name] in Treatment: 20 Compression Therapy Performed for Wound Assessment: Wound #3 Left,Lateral Lower Leg Performed By:  Clinician [name], RN Compression Type: Double Layer Post Procedure Diagnosis Same as Pre-procedure Electronic Signature(s) Signed: 07/11/2021 4:40:05 PM By: [name] RN Entered By: [name] on 07/11/2021 09:35:19 -------------------------------------------------------------------------------- Compression Therapy Details Patient Name: Date of Service: Erin Face D. 07/11/2021 8:45 A M Medical Record Number: [id number] Patient Account Number: [id number] Date of Birth/Sex: Treating RN: 27-Oct-1955 (66 y.o. [name] Primary Care Heberto Sturdevant: [name] Other Clinician: Referring Rydell Wiegel: Treating Gwynn Chalker/Extender: [name] in Treatment: 20 Compression Therapy Performed for Wound Assessment: Wound #6 Right,Anterior Lower Leg Performed By: Clinician [name], RN Compression Type: Double Layer Post Procedure Diagnosis Same as Pre-procedure Electronic Signature(s) Signed: 07/11/2021 4:40:05 PM By: [name] RN Entered By: [name] on 07/11/2021 09:35:19 -------------------------------------------------------------------------------- Encounter Discharge Information Details Patient Name: Date of Service: Erin Face D. 07/11/2021 8:45 A M Medical Record Number: [id number] Patient Account Number: [id number] Date of Birth/Sex: Treating RN: 1955/09/30 (66 y.o. [name] Primary Care Xavius Spadafore: [name] Other Clinician: Referring Antonia Culbertson: Treating Blu Lori/Extender: [name] in Treatment: 20 Encounter Discharge Information Items Post Procedure Vitals Discharge Condition: Stable Temperature (F): 97.7 Ambulatory Status: Ambulatory Pulse (bpm): 81 Discharge Destination: Home Respiratory Rate (breaths/min): 18 Transportation: Private Auto Blood Pressure (mmHg): 129/79 Accompanied By: self Schedule Follow-up Appointment: Yes Clinical Summary of Care:  Patient Declined Electronic Signature(s) Signed: 07/11/2021 4:40:05 PM By: [name] RN Entered By: [name] on 07/11/2021 16:39:32 -------------------------------------------------------------------------------- Lower Extremity Assessment Details Patient Name: Date of Service: [name], [name]. 07/11/2021 8:45 A M Medical Record Number: [id number] Patient Account Number: [id number] Date of Birth/Sex: Treating RN: 1956-01-24 (65 y.o. [name] Primary Care Jelena Malicoat: [name] Other Clinician: Referring Chariah Bailey: Treating Shenaya Lebo/Extender: [name] in Treatment: 20 Edema Assessment Assessed: [Left: No] [Right: No] Edema: [Left: Ye] [Right: s] Calf Left: Right: Point of Measurement: 29 cm From Medial Instep 39.5 cm 42.4 cm Ankle Left: Right: Point of Measurement:  10 cm From Medial Instep 24.2 cm 27 cm Electronic Signature(s) Signed: 07/11/2021 4:40:05 PM By: [name] RN Entered By: [name] on 07/11/2021 09:13:05 -------------------------------------------------------------------------------- Multi Wound Chart Details Patient Name: Date of Service: Erin Face D. 07/11/2021 8:45 A M Medical Record Number: [id number] Patient Account Number: [id number] Date of Birth/Sex: Treating RN: 14-Jan-1956 (66 y.o. [name] Primary Care Charity Tessier: [name] Other Clinician: Referring Marysa Wessner: Treating Rafe Mackowski/Extender: [name] in Treatment: 20 Vital Signs Height(in): 77 Pulse(bpm): 81 Weight(lbs): 226 Blood Pressure(mmHg): 129/79 Body Mass Index(BMI): 41.3 Temperature(F): 97.7 Respiratory Rate(breaths/min): 18 Photos: [N/A:N/A] Left, Lateral Lower Leg Right, Anterior Lower Leg N/A Wound Location: Blister Gradually Appeared N/A Wounding Event: Venous Leg Ulcer Venous Leg Ulcer N/A Primary Etiology: Cataracts, Lymphedema, Cataracts, Lymphedema, N/A Comorbid  History: Hypertension, Peripheral Venous Hypertension, Peripheral Venous Disease, Osteoarthritis Disease, Osteoarthritis 12/13/2020 06/20/2021 N/A Date Acquired: 20 2 N/A Weeks of Treatment: Open Open N/A Wound Status: No No N/A Wound Recurrence: No Yes N/A Clustered Wound: 1.4x2x0.2 0.3x0.7x0.1 N/A Measurements L x W x D (cm) 2.199 0.165 N/A A (cm) : rea 0.44 0.016 N/A Volume (cm) : 92.40% -432.30% N/A % Reduction in Area: 92.40% -433.30% N/A % Reduction in Volume: Full Thickness Without Exposed Full Thickness Without Exposed N/A Classification: Support Structures Support Structures Medium Medium N/A Exudate Amount: Serosanguineous Serous N/A Exudate Type: red, brown amber N/A Exudate Color: Flat and Intact N/A N/A Wound Margin: Medium (34-66%) Large (67-100%) N/A Granulation Amount: Red, Pink Pink N/A Granulation Quality: Medium (34-66%) Small (1-33%) N/A Necrotic Amount: Fat Layer (Subcutaneous Tissue): Yes Fat Layer (Subcutaneous Tissue): Yes N/A Exposed Structures: Fascia: No Fascia: No Tendon: No Tendon: No Muscle: No Muscle: No Joint: No Joint: No Bone: No Bone: No Medium (34-66%) Large (67-100%) N/A Epithelialization: Debridement - Selective/Open Wound Debridement - Selective/Open Wound N/A Debridement: Pre-procedure Verification/Time Out 09:25 09:25 N/A Taken: Other Other N/A Pain Control: [facility] N/A Tissue Debrided: Non-Viable Tissue Non-Viable Tissue N/A Level: 2.8 0.21 N/A Debridement A (sq cm): rea Curette Curette N/A Instrument: Minimum Minimum N/A Bleeding: Pressure Pressure N/A Hemostasis A chieved: 0 0 N/A Procedural Pain: 0 0 N/A Post Procedural Pain: Procedure was tolerated well Procedure was tolerated well N/A Debridement Treatment Response: 1.4x2x0.2 0.3x0.7x0.1 N/A Post Debridement Measurements L x W x D (cm) 0.44 0.016 N/A Post Debridement Volume: (cm) Compression Therapy Compression Therapy  N/A Procedures Performed: Debridement Debridement Treatment Notes Electronic Signature(s) Signed: 07/11/2021 9:49:12 AM By: [name] MD FACS Signed: 07/11/2021 4:40:05 PM By: [name] RN Entered By: [name] on 07/11/2021 09:49:12 -------------------------------------------------------------------------------- Multi-Disciplinary Care Plan Details Patient Name: Date of Service: Erin Face D. 07/11/2021 8:45 A M Medical Record Number: [id number] Patient Account Number: [id number] Date of Birth/Sex: Treating RN: [birth year] (66 y.o. [name] Primary Care Kenyah Luba: [name] Other Clinician: Referring Naliyah Neth: Treating Ronit Cranfield/Extender: [name] in Treatment: 20 Multidisciplinary Care Plan reviewed with physician Active Inactive Venous Leg Ulcer Nursing Diagnoses: Actual venous Insuffiency (use after diagnosis is confirmed) Goals: Patient will maintain optimal edema control Date Initiated: 02/21/2021 Target Resolution Date: 09/08/2021 Goal Status: Active Patient/caregiver will verbalize understanding of disease process and disease management Date Initiated: 02/21/2021 Date Inactivated: 03/29/2021 Target Resolution Date: 03/24/2021 Goal Status: Met Interventions: Assess peripheral edema status every visit. Compression as ordered Provide education on venous insufficiency Notes: Wound/Skin Impairment Nursing Diagnoses: Impaired tissue integrity Knowledge deficit related to ulceration/compromised skin integrity Goals: Patient/caregiver will verbalize understanding of skin care regimen Date Initiated:  02/21/2021 Target Resolution Date: 09/01/2021 Goal Status: Active Ulcer/skin breakdown will have a volume reduction of 30% by week 4 Date Initiated: 02/21/2021 Date Inactivated: 03/29/2021 Target Resolution Date: 03/24/2021 Goal Status: Unmet Unmet Reason: infection Ulcer/skin breakdown will have a volume  reduction of 50% by week 8 Date Initiated: 03/29/2021 Date Inactivated: 04/19/2021 Target Resolution Date: 04/21/2021 Goal Status: Met Interventions: Assess patient/caregiver ability to obtain necessary supplies Assess patient/caregiver ability to perform ulcer/skin care regimen upon admission and as needed Assess ulceration(s) every visit Provide education on ulcer and skin care Notes: Electronic Signature(s) Signed: 07/11/2021 4:40:05 PM By: [name] RN Entered By: [name] on 07/11/2021 16:38:13 -------------------------------------------------------------------------------- Pain Assessment Details Patient Name: Date of Service: [name], [name]. 07/11/2021 8:45 A M Medical Record Number: [id number] Patient Account Number: [id number] Date of Birth/Sex: Treating RN: 1955-03-22 (66 y.o. [name] Primary Care Alexes Lamarque: [name] Other Clinician: Referring Tionne Dayhoff: Treating Lonette Stevison/Extender: [name] in Treatment: 20 Active Problems Location of Pain Severity and Description of Pain Patient Has Paino No Site Locations Pain Management and Medication Current Pain Management: Electronic Signature(s) Signed: 07/11/2021 4:40:05 PM By: [name] RN Entered By: [name] on 07/11/2021 08:57:46 -------------------------------------------------------------------------------- Patient/Caregiver Education Details Patient Name: Date of Service: [name] 5/30/2023andnbsp8:45 [place] Record Number: [id number] Patient Account Number: [id number] Date of Birth/Gender: Treating RN: [birth year] (66 y.o. [name] Primary Care Physician: [name] Other Clinician: Referring Physician: Treating Physician/Extender: [name] in Treatment: 20 Education Assessment Education Provided To: Patient Education Topics Provided Wound/Skin Impairment: Methods:  Explain/Verbal Responses: Return demonstration correctly Electronic Signature(s) Signed: 07/11/2021 4:40:05 PM By: [name] RN Entered By: [name] on 07/11/2021 16:38:33 -------------------------------------------------------------------------------- Wound Assessment Details Patient Name: Date of Service: Erin Face D. 07/11/2021 8:45 A M Medical Record Number: [id number] Patient Account Number: [id number] Date of Birth/Sex: Treating RN: 1955/12/05 (66 y.o. [name] Primary Care Noheli Melder: [name] Other Clinician: Referring Koa Zoeller: Treating Alphonso Gregson/Extender: [name] in Treatment: 20 Wound Status Wound Number: 3 Primary Venous Leg Ulcer Etiology: Wound Location: Left, Lateral Lower Leg Wound Open Wounding Event: Blister Status: Date Acquired: 12/13/2020 Comorbid Cataracts, Lymphedema, Hypertension, Peripheral Venous Weeks Of Treatment: 20 History: Disease, Osteoarthritis Clustered Wound: No Photos Wound Measurements Length: (cm) 1.4 Width: (cm) 2 Depth: (cm) 0.2 Area: (cm) 2.199 Volume: (cm) 0.44 % Reduction in Area: 92.4% % Reduction in Volume: 92.4% Epithelialization: Medium (34-66%) Tunneling: No Undermining: No Wound Description Classification: Full Thickness Without Exposed Support Structures Wound Margin: Flat and Intact Exudate Amount: Medium Exudate Type: Serosanguineous Exudate Color: red, brown Foul Odor After Cleansing: No Slough/Fibrino Yes Wound Bed Granulation Amount: Medium (34-66%) Exposed Structure Granulation Quality: Red, Pink Fascia Exposed: No Necrotic Amount: Medium (34-66%) Fat Layer (Subcutaneous Tissue) Exposed: Yes Necrotic Quality: Adherent Slough Tendon Exposed: No Muscle Exposed: No Joint Exposed: No Bone Exposed: No Treatment Notes Wound #3 (Lower Leg) Wound Laterality: Left, Lateral Cleanser Soap and Water Discharge Instruction: May shower and wash wound  with dial antibacterial soap and water prior to dressing change. Wound Cleanser Discharge Instruction: Cleanse the wound with wound cleanser prior to applying a clean dressing using gauze sponges, not tissue or cotton balls. Peri-Wound Care Zinc Oxide Ointment 30g tube Discharge Instruction: Apply Zinc Oxide to periwound with each dressing change Sween Lotion (Moisturizing lotion) Discharge Instruction: Apply moisturizing lotion as directed Topical Primary Dressing Hydrofera Blue Classic Foam, 2x2 in Discharge Instruction: Moisten with saline prior to  applying to wound bed Secondary Dressing Zetuvit Plus 4x8 in Discharge Instruction: Apply over primary dressing as directed. Secured With Compression Wrap Unnaboot w/Calamine, 4x10 (in/yd) Discharge Instruction: Apply Unnaboot at top of leg CoFlex TLC XL 2-layer Compression System 4x7 (in/yd) Discharge Instruction: Apply CoFlex 2-layer compression as directed. (alt for 4 layer) Compression Stockings Add-Ons Electronic Signature(s) Signed: 07/11/2021 4:40:05 PM By: [name] RN Entered By: [name] on 07/11/2021 09:18:54 -------------------------------------------------------------------------------- Wound Assessment Details Patient Name: Date of Service: Erin Face D. 07/11/2021 8:45 A M Medical Record Number: [id number] Patient Account Number: [id number] Date of Birth/Sex: Treating RN: 1955-11-14 (66 y.o. [name] Primary Care Telesia Ates: [name] Other Clinician: Referring Jaeanna Mccomber: Treating Ervine Witucki/Extender: [name] in Treatment: 20 Wound Status Wound Number: 6 Primary Venous Leg Ulcer Etiology: Wound Location: Right, Anterior Lower Leg Wound Open Wounding Event: Gradually Appeared Status: Date Acquired: 06/20/2021 Comorbid Cataracts, Lymphedema, Hypertension, Peripheral Venous Weeks Of Treatment: 2 History: Disease, Osteoarthritis Clustered Wound:  Yes Photos Wound Measurements Length: (cm) 0.3 Width: (cm) 0.7 Depth: (cm) 0.1 Area: (cm) 0.165 Volume: (cm) 0.016 % Reduction in Area: -432.3% % Reduction in Volume: -433.3% Epithelialization: Large (67-100%) Tunneling: No Undermining: No Wound Description Classification: Full Thickness Without Exposed Support Structures Exudate Amount: Medium Exudate Type: Serous Exudate Color: amber Foul Odor After Cleansing: No Slough/Fibrino Yes Wound Bed Granulation Amount: Large (67-100%) Exposed Structure Granulation Quality: Pink Fascia Exposed: No Necrotic Amount: Small (1-33%) Fat Layer (Subcutaneous Tissue) Exposed: Yes Necrotic Quality: Adherent Slough Tendon Exposed: No Muscle Exposed: No Joint Exposed: No Bone Exposed: No Treatment Notes Wound #6 (Lower Leg) Wound Laterality: Right, Anterior Cleanser Soap and Water Discharge Instruction: May shower and wash wound with dial antibacterial soap and water prior to dressing change. Wound Cleanser Discharge Instruction: Cleanse the wound with wound cleanser prior to applying a clean dressing using gauze sponges, not tissue or cotton balls. Peri-Wound Care Topical Primary Dressing KerraCel Ag Gelling Fiber Dressing, 2x2 in (silver alginate) Discharge Instruction: Apply silver alginate to wound bed as instructed Optifoam Non-Adhesive Dressing, 4x4 in Discharge Instruction: Apply to wound bed as instructed Secondary Dressing Zetuvit Plus 4x8 in Discharge Instruction: Apply over primary dressing as directed. Secured With Transpore Surgical Tape, 2x10 (in/yd) Discharge Instruction: Secure dressing with tape as directed. Compression Wrap Unnaboot w/Calamine, 4x10 (in/yd) Discharge Instruction: Apply Unnaboot at top of the leg CoFlex TLC XL 2-layer Compression System 4x7 (in/yd) Discharge Instruction: Apply CoFlex 2-layer compression as directed. (alt for 4 layer) Compression Stockings Add-Ons Electronic  Signature(s) Signed: 07/11/2021 4:40:05 PM By: [name] RN Entered By: [name] on 07/11/2021 09:19:32 -------------------------------------------------------------------------------- Vitals Details Patient Name: Date of Service: Erin Face D. 07/11/2021 8:45 A M Medical Record Number: [id number] Patient Account Number: [id number] Date of Birth/Sex: Treating RN: 28-Oct-1955 (67 y.o. [name] Primary Care Jamai Dolce: [name] Other Clinician: Referring Mcgwire Dasaro: Treating Kambryn Dapolito/Extender: [name] in Treatment: 20 Vital Signs Time Taken: 08:57 Temperature (F): 97.7 Height (in): 62 Pulse (bpm): 81 Weight (lbs): 226 Respiratory Rate (breaths/min): 18 Body Mass Index (BMI): 41.3 Blood Pressure (mmHg): 129/79 Reference Range: 80 - 120 mg / dl Electronic Signature(s) Signed: 07/11/2021 4:40:05 PM By: [name] RN Entered By: [name] on 07/11/2021 08:57:37

## 2021-07-11 NOTE — Progress Notes (Signed)
Erin, [name] ([id number]) Visit Report for 07/11/2021 Chief Complaint Document Details Patient Name: Date of Service: [name], [name] 07/11/2021 8:45 A M Medical Record Number: [id number] Patient Account Number: [id number] Date of Birth/Sex: Treating RN: 12-08-1955 (66 y.o. [name] Primary Care Provider: [name] Other Clinician: Referring Provider: Treating Provider/Extender: [name] in Treatment: 20 Information Obtained from: Patient Chief Complaint 04/19/2021: The patient is here for ongoing follow-up regarding 2 left lower extremity wounds. Electronic Signature(s) Signed: 07/11/2021 9:49:19 AM By: [name] MD FACS Entered By: [name] on 07/11/2021 09:49:19 -------------------------------------------------------------------------------- Debridement Details Patient Name: Date of Service: Erin Face D. 07/11/2021 8:45 A M Medical Record Number: [id number] Patient Account Number: [id number] Date of Birth/Sex: Treating RN: 04-Aug-1955 (66 y.o. [name] Primary Care Provider: [name] Other Clinician: Referring Provider: Treating Provider/Extender: [name] in Treatment: 20 Debridement Performed for Assessment: Wound #3 Left,Lateral Lower Leg Performed By: Physician [name], MD Debridement Type: Debridement Severity of Tissue Pre Debridement: Fat layer exposed Level of Consciousness (Pre-procedure): Awake and Alert Pre-procedure Verification/Time Out Yes - 09:25 Taken: Start Time: 09:25 Pain Control: Other : Benzocaine 20% T Area Debrided (L x W): otal 1.4 (cm) x 2 (cm) = 2.8 (cm) Tissue and other material debrided: Non-Viable, Slough, Slough Level: Non-Viable Tissue Debridement Description: Selective/Open Wound Instrument: Curette Bleeding: Minimum Hemostasis Achieved: Pressure End Time: 09:26 Procedural Pain: 0 Post Procedural Pain: 0 Response  to Treatment: Procedure was tolerated well Level of Consciousness (Post- Awake and Alert procedure): Post Debridement Measurements of Total Wound Length: (cm) 1.4 Width: (cm) 2 Depth: (cm) 0.2 Volume: (cm) 0.44 Character of Wound/Ulcer Post Debridement: Improved Severity of Tissue Post Debridement: Fat layer exposed Post Procedure Diagnosis Same as Pre-procedure Electronic Signature(s) Signed: 07/11/2021 10:54:03 AM By: [name] MD FACS Signed: 07/11/2021 4:40:05 PM By: [name] RN Entered By: [name] on 07/11/2021 09:29:26 -------------------------------------------------------------------------------- Debridement Details Patient Name: Date of Service: Erin Face D. 07/11/2021 8:45 A M Medical Record Number: [id number] Patient Account Number: [id number] Date of Birth/Sex: Treating RN: 11-02-1955 (66 y.o. [name] Primary Care Provider: [name] Other Clinician: Referring Provider: Treating Provider/Extender: [name] in Treatment: 20 Debridement Performed for Assessment: Wound #6 Right,Anterior Lower Leg Performed By: Physician [name], MD Debridement Type: Debridement Severity of Tissue Pre Debridement: Fat layer exposed Level of Consciousness (Pre-procedure): Awake and Alert Pre-procedure Verification/Time Out Yes - 09:25 Taken: Start Time: 09:25 Pain Control: Other : Benzocaine 20% T Area Debrided (L x W): otal 0.3 (cm) x 0.7 (cm) = 0.21 (cm) Tissue and other material debrided: Non-Viable, Slough, Slough Level: Non-Viable Tissue Debridement Description: Selective/Open Wound Instrument: Curette Bleeding: Minimum Hemostasis Achieved: Pressure End Time: 09:26 Procedural Pain: 0 Post Procedural Pain: 0 Response to Treatment: Procedure was tolerated well Level of Consciousness (Post- Awake and Alert procedure): Post Debridement Measurements of Total Wound Length: (cm) 0.3 Width:  (cm) 0.7 Depth: (cm) 0.1 Volume: (cm) 0.016 Character of Wound/Ulcer Post Debridement: Improved Severity of Tissue Post Debridement: Fat layer exposed Post Procedure Diagnosis Same as Pre-procedure Electronic Signature(s) Signed: 07/11/2021 10:54:03 AM By: [name] MD FACS Signed: 07/11/2021 4:40:05 PM By: [name] RN Entered By: [name] on 07/11/2021 09:32:49 -------------------------------------------------------------------------------- HPI Details Patient Name: Date of Service: Erin Face D. 07/11/2021 8:45 A M Medical Record Number: [id number] Patient Account Number: [id number] Date of Birth/Sex: Treating RN: [birth year] (66 y.o. 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 She tells [name] that she fell on the gravel driveway in July. She developed injuries on her distal lower leg which have not healed. She saw her primary physician on 11/15/2017 who noted her left shin injuries. Gave her antibiotics. At that point the wounds were almost circumferential however most were less than 1.5 cm. Weeping edema fluid was noted. She was referred here for evaluation. The patient has a history of chronic lower extremity edema. 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 She has had prior history with leg weeping fluid. She does not wear compression stockings. She is not doing anything specific to these wound areas. The patient has a history of obesity, arthritis, peripheral vascular disease hypertension lower extremity edema and Schamberg's disease ABI in our clinic was 1.3 on the  left 12/17/2017; patient readmitted to the clinic last week. 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 She also has lymphedema. Put her in 3 layer compression and silver alginate last week. She has 3 small wounds with depth just lateral to the tibia. More problematically than this she has numerous shallow areas some of which are almost canal like in shape with tightly adherent painful debris. It would be very difficult and time- consuming to go through this and attempt to individually debride all these areas.. I changed her to collagen today to see if that would help with any of the surface debris on some of these wounds. Otherwise we will not be able to put this in compression we have to have someone change the dressing. The patient is not eligible for home health 12/25/17 on evaluation today patient actually appears to be doing rather well in regard to the ulcer on her lower extremity. Fortunately there does not appear to be evidence of infection at this time. She has been tolerating the dressing changes without complication. This includes the compression wrap. The only issue she had was that the wrap was initially placed over her bunion region which actually calls her some discomfort and pain. Other than that things seem to be going rather well. 01/01/2018 Seen today for follow-up and management of left lower extremity wound and lymphedema. T oday she presents with a new wound towards to the left lateral LE. Recently treated with a 7 day course of amoxicillin; reason for antibiotic dose is unknown at this time. Tolerating current treatment of collagen with 4- layer wraps. She obtained a venous reflux study on 12/25/17. Studies show on the right abnormal reflux times of the popliteal vein, great saphenous vein at the saphenofemoral junction at the proximal thigh, great saphenous vein at the mid calf, and origin of the small saphenous vein.No  superficial thrombosis. No deep vein thrombosis in the common femoral, femoral,and popliteal veins. Left abnormal reflex times as well of the common femoral vein, popliteal vein, and a great saphenous vein at the saphenofemoral junction, In great saphenous vein at the mid thigh w/o thrombosis. Has any issues or concerns during visit today. Recommended follow-up to vascular specialist due to abnormalities from the venous reflux study. Denies fever, pain, chills, dizziness, nausea, or vomiting. 01/08/18 upon evaluation today the patient actually seems to be showing  some signs of improvement in my opinion at this point in regard to the lower extremity ulcerated areas. She still has a lot of drainage but fortunately nothing that appears to be too significant currently. I have been very happy with the overall progress I see today compared to where things were during the last evaluation that I had with her. Nonetheless she has not had her appointment with the vein specialist as of yet in fact we were able to get this approved for her today and confirmed with them she will be seeing them on December 26. Nonetheless in general I do feel like the compression wraps is doing well for her. 01/17/18; quite a bit of improvement since last time I saw this patient she has a small open area remaining on the left lateral calf and even smaller area medially. She has lymphedema chronic stasis changes with distal skin fibrosis. She has an appointment with vascular surgery later this month 01/24/2018; the patient's medial leg has closed. Still a small open area on the left lateral leg. She states that the 4 layer compression we put on last week was too tight and she had to take it off over a few days ago. We have had resultant increase in her lymphedema in the dorsal foot and a proximal calf. Fortunately that does not seem to have resulted in any deterioration in her wounds The patient is going to need compression  stockings. We have given her measurements to phone elastic therapy in [place]. She has vascular surgery consult on December 26 02/07/18; she's had continuous contraction on the left lateral leg wound which is now very small. Currently using silver alginate under 3 layer compression She saw Dr. Trula Slade of vascular surgery on 02/06/18. It was noted that she had a normal reflux times in the popliteal vein, great saphenous vein at the saphenofemoral junction, great saphenous vein at the proximal thigh great saphenous vein at the mid calf and origin of the small saphenous vein. It was noted that she had significant reflux in the left saphenous veins with diameter measurements in the 0.7-0.8 cm range. It was felt she would benefit from laser ablation to help minimize the risk of ulcer recurrence. It was recommended that she wear 20-30 thigh-high compression stockings and have follow-up in 4-6 weeks 02/17/2018; I thought this lady would be healed however her compression slipped down and she developed increasing swelling and the wound is actually larger. 1/13; we had deterioration last week after the patient's compression slipped down and she developed periwound swelling. I increased her compression before layers we have been using silver alginate we are a lot better again today. She has her compression stockings in waiting 1/23; the patient's wounds are totally healed today. She has her stockings. This was almost circumferential skin damage. She follows up with Dr. Trula Slade of vascular surgery next Monday. READMISSION 02/21/2021 This is a now 66 year old woman that we had in clinic here discharging in January 2020 with wounds on her left calf chronic venous insufficiency. She was discharged with 30/40 stockings. It does not sound like she has worn stockings in about a year largely from not being able to get them on herself. In November she developed new blisters on her legs an area laterally is opened into a  fairly sizable wound. She has weeping posteriorly as well. She has been using Neosporin and Band-Aids. The patient did see Dr. Trula Slade in 2019 and 2020. He felt she might benefit from laser ablation in her  left saphenous veins although because of the wound that healed I do not think he went through with it. She might benefit from seeing him again. Her ABI on the left is 1. 1/17; patient's wound on the posterior left calf is closed she has the 2 large areas last week. We put her in 4-layer compression for edema control is a lot better. Our intake nurse noted greenish drainage and odor. We have been using silver alginate 1/25; PCR culture I did have the substantial wound area on the left lateral lower leg showed staff aureus and group A strep. Low titers of coag negative staph which are probably skin contaminants. Resistance detected to tetracycline methicillin and macrolides. I gave her a starter kit of Nuzyra 150 mg x 3 for 2 days then 300 mg for a further 7 days. Marked odor considerable increase in surrounding erythema. We are using Iodoflex last week however I have changed to silver alginate with underlying Bactroban 2/1; she is completing her [place] tomorrow. The degree of erythema around the wounds looks a lot better. Odor has improved. I gave her silver alginate and Bactroban last week and changing her back to Iodoflex to continue with ongoing debridement 2/8; the periwound looks a lot better. Surface of the wound also looks somewhat better although there is still ongoing debridement to be done we have been using Iodoflex under compression. Primary dressing and silver alginate 2/15; left posterior calf. Some improvement in the surface of the wound but still very gritty we have been using Iodoflex under compression. 04/05/2021: Left lateral posterior calf. She continues to have significant amounts of drainage, some of which is probably comprised of the Iodoflex microbleeds. There is no odor to  the drainage. The satellite lesion on the more posterior aspect of the calf is epithelializing nicely and has contracted quite a bit. There is robust granulation tissue in the dominant wound with minimal adherent slough. 04/12/2021: The satellite lesion has nearly closed. She continues to have good granulation tissue with minimal slough of the larger primary wound. She continues to have a fair amount of drainage, but I think this is less secondary to switching her dressing from Iodoflex to Prisma. 04/19/2021: The satellite lesion is almost completely epithelialized. The larger primary wound continues to contract with good granulation tissue and minimal slough. She is currently in [place] with compression. 04/26/2021: The satellite lesion has closed. The larger primary wound has contracted further and the granulation tissue is robust without being hypertrophic. Minimal slough is present. 05/03/2021: The satellite lesion remains closed. The larger primary wound has a bit of slough, but has also contracted further with good perimeter epithelialization. Good granulation tissue at the wound surface. There was some greenish drainage on the dressing when it was removed; it is not the blue-green typically associated with Pseudomonas aeruginosa, however. 05/10/2021: The primary wound continues to contract. There is minimal slough. The granulation tissue at 9:00 is a bit hypertrophic. No significant drainage. No odor. 05/17/2021: The wound is a little bit smaller today with good granulation tissue. Minimal slough. No significant drainage or odor. 05/24/2021: The wound continues to contract and has a nice base of granulation tissue. Small amount of slough. No concern for infection. 05/31/2021: For some reason, the wound measured slightly larger today but overall it still appears to be in good condition with a nice base of granulation tissue and minimal slough. 06/07/2021: The wound is smaller today. Good granulation tissue  and minimal slough. 06/14/2021: The wound is unchanged in  size. She continues to accumulate some slough. Good granulation tissue on the surface. 06/20/2021: The wound is smaller today. Minimal slough with good granulation tissue. 06/27/2021: The wound on her left lateral leg is smaller today with just a bit of slough and eschar accumulation. Unfortunately, she has opened a new superficial wound on her right lower extremity. She has 3+ pitting edema to the knees on that leg and she does not wear compression stockings. 07/04/2021: In addition to the superficial wound on her right lower extremity that she opened up last week, she has opened 3 additional sites on the same leg. Her compression wraps were clearly not in correct position when she came to clinic today as they were at about the mid calf level rather than to the tibial tuberosity. She says that they slipped earlier and she had to come back on Friday to have them redone. The wound on her left lateral leg is perhaps slightly larger with some slough accumulation. 07/11/2021: The superficial wounds on her right lower extremity are nearly closed. They just have a thin layer of eschar overlying them. The wound on her left lateral leg is a little bit shallower and has a bit of slough accumulation. Electronic Signature(s) Signed: 07/11/2021 9:49:57 AM By: [name] MD FACS Entered By: [name] on 07/11/2021 09:49:57 -------------------------------------------------------------------------------- Physical Exam Details Patient Name: Date of Service: Erin Face D. 07/11/2021 8:45 A M Medical Record Number: [id number] Patient Account Number: [id number] Date of Birth/Sex: Treating RN: 01/10/1956 (66 y.o. [name] Primary Care Provider: [name] Other Clinician: Referring Provider: Treating Provider/Extender: [name] in Treatment: 20 Constitutional . . . . No acute  distress. Respiratory Normal work of breathing on room air. Notes 07/11/2021: The superficial wounds on her right lower extremity are nearly closed. They just have a thin layer of eschar overlying them. The wound on her left lateral leg is a little bit shallower and has a bit of slough accumulation Electronic Signature(s) Signed: 07/11/2021 9:50:42 AM By: [name] MD FACS Entered By: [name] on 07/11/2021 09:50:42 -------------------------------------------------------------------------------- Physician Orders Details Patient Name: Date of Service: Erin Face D. 07/11/2021 8:45 A M Medical Record Number: [id number] Patient Account Number: [id number] Date of Birth/Sex: Treating RN: 03-Sep-1955 (66 y.o. [name] Primary Care Provider: [name] Other Clinician: Referring Provider: Treating Provider/Extender: [name] in Treatment: 20 Verbal / Phone Orders: No Diagnosis Coding ICD-10 Coding Code Description [phone] Chronic venous hypertension (idiopathic) with ulcer and inflammation of left lower extremity L97.828 Non-pressure chronic ulcer of other part of left lower leg with other specified severity L97.811 Non-pressure chronic ulcer of other part of right lower leg limited to breakdown of skin Follow-up Appointments ppointment in 1 week. - Dr. Celine Ahr - Room 3 Return A Bathing/ Shower/ Hygiene May shower with protection but do not get wound dressing(s) wet. - Ok to use [employer], can purchase at [facility], [facility], or [employer] Edema Control - Lymphedema / SCD / Other Elevate legs to the level of the heart or above for 30 minutes daily and/or when sitting, a frequency of: - throughout the day Avoid standing for long periods of time. Exercise regularly Compression stocking or Garment 20-30 mm/Hg pressure to: - Brochure for Elastic Therapy. Leg measurements included Wound Treatment Wound #3 - Lower Leg Wound Laterality:  Left, Lateral Cleanser: Soap and Water 1 x Per Week/30 Days Discharge Instructions: May shower and wash wound with dial antibacterial soap and water  prior to dressing change. Cleanser: Wound Cleanser 1 x Per Week/30 Days Discharge Instructions: Cleanse the wound with wound cleanser prior to applying a clean dressing using gauze sponges, not tissue or cotton balls. Peri-Wound Care: Zinc Oxide Ointment 30g tube 1 x Per Week/30 Days Discharge Instructions: Apply Zinc Oxide to periwound with each dressing change Peri-Wound Care: Sween Lotion (Moisturizing lotion) 1 x Per Week/30 Days Discharge Instructions: Apply moisturizing lotion as directed Prim Dressing: Hydrofera Blue Classic Foam, 2x2 in 1 x Per Week/30 Days ary Discharge Instructions: Moisten with saline prior to applying to wound bed Secondary Dressing: Zetuvit Plus 4x8 in 1 x Per Week/30 Days Discharge Instructions: Apply over primary dressing as directed. Compression Wrap: Unnaboot w/Calamine, 4x10 (in/yd) 1 x Per Week/30 Days Discharge Instructions: Apply Unnaboot at top of leg Compression Wrap: CoFlex TLC XL 2-layer Compression System 4x7 (in/yd) 1 x Per Week/30 Days Discharge Instructions: Apply CoFlex 2-layer compression as directed. (alt for 4 layer) Wound #6 - Lower Leg Wound Laterality: Right, Anterior Cleanser: Soap and Water 1 x Per Week/30 Days Discharge Instructions: May shower and wash wound with dial antibacterial soap and water prior to dressing change. Cleanser: Wound Cleanser 1 x Per Week/30 Days Discharge Instructions: Cleanse the wound with wound cleanser prior to applying a clean dressing using gauze sponges, not tissue or cotton balls. Prim Dressing: KerraCel Ag Gelling Fiber Dressing, 2x2 in (silver alginate) 1 x Per Week/30 Days ary Discharge Instructions: Apply silver alginate to wound bed as instructed Prim Dressing: Optifoam Non-Adhesive Dressing, 4x4 in 1 x Per Week/30 Days ary Discharge Instructions:  Apply to wound bed as instructed Secondary Dressing: Zetuvit Plus 4x8 in 1 x Per Week/30 Days Discharge Instructions: Apply over primary dressing as directed. Secured With: Transpore Surgical Tape, 2x10 (in/yd) 1 x Per Week/30 Days Discharge Instructions: Secure dressing with tape as directed. Compression Wrap: Unnaboot w/Calamine, 4x10 (in/yd) 1 x Per Week/30 Days Discharge Instructions: Apply Unnaboot at top of the leg Compression Wrap: CoFlex TLC XL 2-layer Compression System 4x7 (in/yd) 1 x Per Week/30 Days Discharge Instructions: Apply CoFlex 2-layer compression as directed. (alt for 4 layer) Electronic Signature(s) Signed: 07/11/2021 3:01:24 PM By: [name] MD FACS Signed: 07/11/2021 4:40:05 PM By: [name] RN Previous Signature: 07/11/2021 10:54:03 AM Version By: [name] MD FACS Entered By: [name] on 07/11/2021 12:02:37 -------------------------------------------------------------------------------- Problem List Details Patient Name: Date of Service: Erin Face D. 07/11/2021 8:45 A M Medical Record Number: [id number] Patient Account Number: [id number] Date of Birth/Sex: Treating RN: 10-26-1955 (66 y.o. [name] Primary Care Provider: [name] Other Clinician: Referring Provider: Treating Provider/Extender: [name] in Treatment: 20 Active Problems ICD-10 Encounter Code Description Active Date MDM Diagnosis 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 (idiopathic) with ulcer and inflammation of left 02/21/2021 No Yes lower extremity L97.828 Non-pressure chronic ulcer of other part of left lower leg with other specified 02/21/2021 No Yes severity L97.811 Non-pressure chronic ulcer of other part of right lower leg limited to breakdown 06/27/2021 No Yes of skin Inactive Problems ICD-10 Code Description Active Date Inactive Date L03.116 Cellulitis of left lower limb 03/08/2021 03/08/2021 Resolved  Problems Electronic Signature(s) Signed: 07/11/2021 9:48:13 AM By: [name] MD FACS Entered By: [name] on 07/11/2021 09:48:12 -------------------------------------------------------------------------------- Progress Note Details Patient Name: Date of Service: Erin Face D. 07/11/2021 8:45 A M Medical Record Number: [id number] Patient Account Number: [id number] Date of Birth/Sex: Treating RN: 1955-07-14 (66 y.o. [name] Primary Care Provider: [name] Other  Clinician: Referring Provider: Treating Provider/Extender: [name] in Treatment: 20 Subjective Chief Complaint Information obtained from Patient 04/19/2021: The patient is here for ongoing follow-up regarding 2 left lower extremity wounds. 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 She tells [name] that she fell on the gravel driveway in July. She developed injuries on her distal lower leg which have not healed. She saw her primary physician on 11/15/2017 who noted her left shin injuries. Gave her antibiotics. At that point the wounds were almost circumferential however most were less than 1.5 cm. Weeping edema fluid was noted. She was referred here for evaluation. The patient has a history of chronic lower extremity edema. 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 She has had prior history with leg weeping fluid. She does not wear compression stockings. She is not doing anything specific to these wound areas. 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 She also has lymphedema. Put her in 3 layer compression and silver alginate last week. She has 3 small wounds with depth just lateral to the tibia. More problematically than this she has numerous shallow areas some of which are almost canal like in shape with tightly adherent painful debris. It would be very difficult and time- consuming to go through this and attempt to individually debride all these areas.. I changed her to collagen today to see if that would help with any of the surface debris on some of these wounds. Otherwise we will not be able to put this in compression we have to have someone change the dressing. The patient is not eligible for home health 12/25/17 on evaluation today patient actually appears to be doing rather well in regard to the ulcer on her lower extremity. Fortunately there does not appear to be evidence of infection at this time. She has been tolerating the dressing changes without complication. This includes the compression wrap. The only issue she had was that the wrap was initially placed over her bunion region which actually calls her some discomfort and pain. Other than that things seem to be going rather well. 01/01/2018 Seen today for follow-up and management of left lower extremity wound and lymphedema. T oday she presents with a new wound towards to the left lateral LE. Recently treated with a 7 day course of amoxicillin; reason for antibiotic dose is unknown at this time. Tolerating current treatment of collagen with 4- layer wraps. She obtained a venous reflux study on 12/25/17. Studies show on the right abnormal reflux times of the popliteal vein, great saphenous vein at the saphenofemoral junction at the proximal thigh, great saphenous vein at the mid calf, and origin of the small saphenous vein.No superficial thrombosis. No deep vein thrombosis in the common femoral, femoral,and popliteal veins. Left  abnormal reflex times as well of the common femoral vein, popliteal vein, and a great saphenous vein at the saphenofemoral junction, In great saphenous vein at the mid thigh w/o thrombosis. Has any issues or concerns during visit today. Recommended follow-up to vascular specialist due to abnormalities from the venous reflux study. Denies  fever, pain, chills, dizziness, nausea, or vomiting. 01/08/18 upon evaluation today the patient actually seems to be showing some signs of improvement in my opinion at this point in regard to the lower extremity ulcerated areas. She still has a lot of drainage but fortunately nothing that appears to be too significant currently. I have been very happy with the overall progress I see today compared to where things were during the last evaluation that I had with her. Nonetheless she has not had her appointment with the vein specialist as of yet in fact we were able to get this approved for her today and confirmed with them she will be seeing them on December 26. Nonetheless in general I do feel like the compression wraps is doing well for her. 01/17/18; quite a bit of improvement since last time I saw this patient she has a small open area remaining on the left lateral calf and even smaller area medially. She has lymphedema chronic stasis changes with distal skin fibrosis. She has an appointment with vascular surgery later this month 01/24/2018; the patient's medial leg has closed. Still a small open area on the left lateral leg. She states that the 4 layer compression we put on last week was too tight and she had to take it off over a few days ago. We have had resultant increase in her lymphedema in the dorsal foot and a proximal calf. Fortunately that does not seem to have resulted in any deterioration in her wounds The patient is going to need compression stockings. We have given her measurements to phone elastic therapy in [place]. She has vascular surgery  consult on December 26 02/07/18; she's had continuous contraction on the left lateral leg wound which is now very small. Currently using silver alginate under 3 layer compression She saw Dr. Trula Slade of vascular surgery on 02/06/18. It was noted that she had a normal reflux times in the popliteal vein, great saphenous vein at the saphenofemoral junction, great saphenous vein at the proximal thigh great saphenous vein at the mid calf and origin of the small saphenous vein. It was noted that she had significant reflux in the left saphenous veins with diameter measurements in the 0.7-0.8 cm range. It was felt she would benefit from laser ablation to help minimize the risk of ulcer recurrence. It was recommended that she wear 20-30 thigh-high compression stockings and have follow-up in 4-6 weeks 02/17/2018; I thought this lady would be healed however her compression slipped down and she developed increasing swelling and the wound is actually larger. 1/13; we had deterioration last week after the patient's compression slipped down and she developed periwound swelling. I increased her compression before layers we have been using silver alginate we are a lot better again today. She has her compression stockings in waiting 1/23; the patient's wounds are totally healed today. She has her stockings. This was almost circumferential skin damage. She follows up with Dr. Trula Slade of vascular surgery next Monday. READMISSION 02/21/2021 This is a now 66 year old woman that we had in clinic here discharging in January 2020 with wounds on her left calf chronic venous insufficiency. She was discharged with 30/40 stockings. It does not sound like she has worn stockings in about a year largely from not being able to get them on herself. In November she developed new blisters on her legs an area laterally is opened into a fairly sizable wound. She has weeping posteriorly as well. She has been using Neosporin and  Band-Aids. The patient  did see Dr. Trula Slade in 2019 and 2020. He felt she might benefit from laser ablation in her left saphenous veins although because of the wound that healed I do not think he went through with it. She might benefit from seeing him again. Her ABI on the left is 1. 1/17; patient's wound on the posterior left calf is closed she has the 2 large areas last week. We put her in 4-layer compression for edema control is a lot better. Our intake nurse noted greenish drainage and odor. We have been using silver alginate 1/25; PCR culture I did have the substantial wound area on the left lateral lower leg showed staff aureus and group A strep. Low titers of coag negative staph which are probably skin contaminants. Resistance detected to tetracycline methicillin and macrolides. I gave her a starter kit of Nuzyra 150 mg x 3 for 2 days then 300 mg for a further 7 days. Marked odor considerable increase in surrounding erythema. We are using Iodoflex last week however I have changed to silver alginate with underlying Bactroban 2/1; she is completing her [place] tomorrow. The degree of erythema around the wounds looks a lot better. Odor has improved. I gave her silver alginate and Bactroban last week and changing her back to Iodoflex to continue with ongoing debridement 2/8; the periwound looks a lot better. Surface of the wound also looks somewhat better although there is still ongoing debridement to be done we have been using Iodoflex under compression. Primary dressing and silver alginate 2/15; left posterior calf. Some improvement in the surface of the wound but still very gritty we have been using Iodoflex under compression. 04/05/2021: Left lateral posterior calf. She continues to have significant amounts of drainage, some of which is probably comprised of the Iodoflex microbleeds. There is no odor to the drainage. The satellite lesion on the more posterior aspect of the calf is epithelializing  nicely and has contracted quite a bit. There is robust granulation tissue in the dominant wound with minimal adherent slough. 04/12/2021: The satellite lesion has nearly closed. She continues to have good granulation tissue with minimal slough of the larger primary wound. She continues to have a fair amount of drainage, but I think this is less secondary to switching her dressing from Iodoflex to Prisma. 04/19/2021: The satellite lesion is almost completely epithelialized. The larger primary wound continues to contract with good granulation tissue and minimal slough. She is currently in [place] with compression. 04/26/2021: The satellite lesion has closed. The larger primary wound has contracted further and the granulation tissue is robust without being hypertrophic. Minimal slough is present. 05/03/2021: The satellite lesion remains closed. The larger primary wound has a bit of slough, but has also contracted further with good perimeter epithelialization. Good granulation tissue at the wound surface. There was some greenish drainage on the dressing when it was removed; it is not the blue-green typically associated with Pseudomonas aeruginosa, however. 05/10/2021: The primary wound continues to contract. There is minimal slough. The granulation tissue at 9:00 is a bit hypertrophic. No significant drainage. No odor. 05/17/2021: The wound is a little bit smaller today with good granulation tissue. Minimal slough. No significant drainage or odor. 05/24/2021: The wound continues to contract and has a nice base of granulation tissue. Small amount of slough. No concern for infection. 05/31/2021: For some reason, the wound measured slightly larger today but overall it still appears to be in good condition with a nice base of granulation tissue and minimal slough. 06/07/2021:  The wound is smaller today. Good granulation tissue and minimal slough. 06/14/2021: The wound is unchanged in size. She continues to accumulate  some slough. Good granulation tissue on the surface. 06/20/2021: The wound is smaller today. Minimal slough with good granulation tissue. 06/27/2021: The wound on her left lateral leg is smaller today with just a bit of slough and eschar accumulation. Unfortunately, she has opened a new superficial wound on her right lower extremity. She has 3+ pitting edema to the knees on that leg and she does not wear compression stockings. 07/04/2021: In addition to the superficial wound on her right lower extremity that she opened up last week, she has opened 3 additional sites on the same leg. Her compression wraps were clearly not in correct position when she came to clinic today as they were at about the mid calf level rather than to the tibial tuberosity. She says that they slipped earlier and she had to come back on Friday to have them redone. The wound on her left lateral leg is perhaps slightly larger with some slough accumulation. 07/11/2021: The superficial wounds on her right lower extremity are nearly closed. They just have a thin layer of eschar overlying them. The wound on her left lateral leg is a little bit shallower and has a bit of slough accumulation. Patient History Information obtained from Patient. Family History Heart Disease - Mother,Father, Hypertension - Mother,Father, Kidney Disease - Mother, Thyroid Problems - Mother, No family history of Cancer, Diabetes, Hereditary Spherocytosis, Lung Disease, Seizures, Stroke, Tuberculosis. Social History Never smoker, Marital Status - Single, Alcohol Use - Never, Drug Use - No History, Caffeine Use - Daily - coffee. Medical History Eyes Patient has history of Cataracts Hematologic/Lymphatic Patient has history of Lymphedema Cardiovascular Patient has history of Hypertension, Peripheral Venous Disease Integumentary (Skin) Denies history of History of Burn Musculoskeletal Patient has history of Osteoarthritis Hospitalization/Surgery History -  colostomy reversal. 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 Medical A Surgical History Notes nd Constitutional Symptoms (General Health) morbid obesity Cardiovascular schamberg disease, hyperlipidemia Gastrointestinal diverticulitis , h/o obstruction due to diverticulitis , colostomy and colostomy reversal Endocrine hypothyroidism Objective Constitutional No acute distress. Vitals Time Taken: 8:57 AM, Height: 62 in, Weight: 226 lbs, BMI: 41.3, Temperature: 97.7 F, Pulse: 81 bpm, Respiratory Rate: 18 breaths/min, Blood Pressure: 129/79 mmHg. Respiratory Normal work of breathing on room air. General Notes: 07/11/2021: The superficial wounds on her right lower extremity are nearly closed. They just have a thin layer of eschar overlying them. The wound on her left lateral leg is a little bit shallower and has a bit of slough accumulation Integumentary (Hair, Skin) Wound #3 status is Open. Original cause of wound was Blister. The date acquired was: 12/13/2020. The wound has been in treatment 20 weeks. The wound is located on the Left,Lateral Lower Leg. The wound measures 1.4cm length x 2cm width x 0.2cm depth; 2.199cm^2 area and 0.44cm^3 volume. There is Fat Layer (Subcutaneous Tissue) exposed. There is no tunneling or undermining noted. There is a medium amount of serosanguineous drainage noted. The wound margin is flat and intact. There is medium (34-66%) red, pink granulation within the wound bed. There is a medium (34-66%) amount of necrotic tissue within the wound bed including Adherent Slough. Wound #6 status is Open. Original cause of wound was Gradually Appeared. The date acquired was: 06/20/2021. The wound has been in treatment 2 weeks. The wound is located on the Right,Anterior Lower Leg. The wound measures 0.3cm length x 0.7cm width x 0.1cm  depth; 0.165cm^2 area and 0.016cm^3 volume. There is Fat Layer (Subcutaneous Tissue) exposed. There is no tunneling or undermining noted. There is a  medium amount of serous drainage noted. There is large (67-100%) pink granulation within the wound bed. There is a small (1-33%) amount of necrotic tissue within the wound bed including Adherent Slough. Assessment Active Problems ICD-10 Chronic venous hypertension (idiopathic) with ulcer and inflammation of left lower extremity Non-pressure chronic ulcer of other part of left lower leg with other specified severity Non-pressure chronic ulcer of other part of right lower leg limited to breakdown of skin Procedures Wound #3 Pre-procedure diagnosis of Wound #3 is a Venous Leg Ulcer located on the Left,Lateral Lower Leg .Severity of Tissue Pre Debridement is: Fat layer exposed. There was a Selective/Open Wound Non-Viable Tissue Debridement with a total area of 2.8 sq cm performed by [name], MD. With the following instrument(s): Curette to remove Non-Viable tissue/material. Material removed includes [facility] after achieving pain control using Other (Benzocaine 20%). No specimens were taken. A time out was conducted at 09:25, prior to the start of the procedure. A Minimum amount of bleeding was controlled with Pressure. The procedure was tolerated well with a pain level of 0 throughout and a pain level of 0 following the procedure. Post Debridement Measurements: 1.4cm length x 2cm width x 0.2cm depth; 0.44cm^3 volume. Character of Wound/Ulcer Post Debridement is improved. Severity of Tissue Post Debridement is: Fat layer exposed. Post procedure Diagnosis Wound #3: Same as Pre-Procedure Pre-procedure diagnosis of Wound #3 is a Venous Leg Ulcer located on the Left,Lateral Lower Leg . There was a Double Layer Compression Therapy Procedure by [name], RN. Post procedure Diagnosis Wound #3: Same as Pre-Procedure Wound #6 Pre-procedure diagnosis of Wound #6 is a Venous Leg Ulcer located on the Right,Anterior Lower Leg .Severity of Tissue Pre Debridement is: Fat layer exposed. There was a  Selective/Open Wound Non-Viable Tissue Debridement with a total area of 0.21 sq cm performed by [name], MD. With the following instrument(s): Curette to remove Non-Viable tissue/material. Material removed includes [facility] after achieving pain control using Other (Benzocaine 20%). No specimens were taken. A time out was conducted at 09:25, prior to the start of the procedure. A Minimum amount of bleeding was controlled with Pressure. The procedure was tolerated well with a pain level of 0 throughout and a pain level of 0 following the procedure. Post Debridement Measurements: 0.3cm length x 0.7cm width x 0.1cm depth; 0.016cm^3 volume. Character of Wound/Ulcer Post Debridement is improved. Severity of Tissue Post Debridement is: Fat layer exposed. Post procedure Diagnosis Wound #6: Same as Pre-Procedure Pre-procedure diagnosis of Wound #6 is a Venous Leg Ulcer located on the Right,Anterior Lower Leg . There was a Double Layer Compression Therapy Procedure by [name], RN. Post procedure Diagnosis Wound #6: Same as Pre-Procedure Plan Follow-up Appointments: Return Appointment in 1 week. - Dr. Celine Ahr - Room 3 Bathing/ Shower/ Hygiene: May shower with protection but do not get wound dressing(s) wet. - Ok to use Market [employer], can purchase at [facility], [facility], or [employer] Edema Control - Lymphedema / SCD / Other: Elevate legs to the level of the heart or above for 30 minutes daily and/or when sitting, a frequency of: - throughout the day Avoid standing for long periods of time. Exercise regularly Compression stocking or Garment 20-30 mm/Hg pressure to: - Brochure for Elastic Therapy. Leg measurements included WOUND #3: - Lower Leg Wound Laterality: Left, Lateral Cleanser: Soap and Water 1 x Per Week/30  Days Discharge Instructions: May shower and wash wound with dial antibacterial soap and water prior to dressing change. Cleanser: Wound Cleanser 1 x Per Week/30 Days Discharge  Instructions: Cleanse the wound with wound cleanser prior to applying a clean dressing using gauze sponges, not tissue or cotton balls. Peri-Wound Care: Zinc Oxide Ointment 30g tube 1 x Per Week/30 Days Discharge Instructions: Apply Zinc Oxide to periwound with each dressing change Peri-Wound Care: Sween Lotion (Moisturizing lotion) 1 x Per Week/30 Days Discharge Instructions: Apply moisturizing lotion as directed Prim Dressing: Hydrofera Blue Classic Foam, 2x2 in 1 x Per Week/30 Days ary Discharge Instructions: Moisten with saline prior to applying to wound bed Secondary Dressing: Zetuvit Plus 4x8 in 1 x Per Week/30 Days Discharge Instructions: Apply over primary dressing as directed. Com pression Wrap: Unnaboot w/Calamine, 4x10 (in/yd) 1 x Per Week/30 Days Discharge Instructions: Apply Unnaboot at top of leg Com pression Wrap: CoFlex TLC XL 2-layer Compression System 4x7 (in/yd) 1 x Per Week/30 Days Discharge Instructions: Apply CoFlex 2-layer compression as directed. (alt for 4 layer) WOUND #6: - Lower Leg Wound Laterality: Right, Anterior Cleanser: Soap and Water 1 x Per Week/30 Days Discharge Instructions: May shower and wash wound with dial antibacterial soap and water prior to dressing change. Cleanser: Wound Cleanser 1 x Per Week/30 Days Discharge Instructions: Cleanse the wound with wound cleanser prior to applying a clean dressing using gauze sponges, not tissue or cotton balls. Prim Dressing: KerraCel Ag Gelling Fiber Dressing, 2x2 in (silver alginate) 1 x Per Week/30 Days ary Discharge Instructions: Apply silver alginate to wound bed as instructed Secondary Dressing: Zetuvit Plus 4x8 in 1 x Per Week/30 Days Discharge Instructions: Apply over primary dressing as directed. Secured With: Transpore Surgical T ape, 2x10 (in/yd) 1 x Per Week/30 Days Discharge Instructions: Secure dressing with tape as directed. Com pression Wrap: Unnaboot w/Calamine, 4x10 (in/yd) 1 x Per Week/30  Days Discharge Instructions: Apply Unnaboot at top of the leg Com pression Wrap: CoFlex TLC XL 2-layer Compression System 4x7 (in/yd) 1 x Per Week/30 Days Discharge Instructions: Apply CoFlex 2-layer compression as directed. (alt for 4 layer) 07/11/2021: The superficial wounds on her right lower extremity are nearly closed. They just have a thin layer of eschar overlying them. The wound on her left lateral leg is a little bit shallower and has a bit of slough accumulation I used a curette to debride the eschar from the right sided wounds and the slough from the left-sided wounds. We will continue Hydrofera Blue on the left and silver alginate on the right. Bilateral Coflex compression wraps. Follow-up in 1 week. Electronic Signature(s) Signed: 07/11/2021 9:51:30 AM By: [name] MD FACS Entered By: [name] on 07/11/2021 09:51:29 -------------------------------------------------------------------------------- HxROS Details Patient Name: Date of Service: Erin Gerold [name] 07/11/2021 8:45 A M Medical Record Number: [id number] Patient Account Number: [id number] Date of Birth/Sex: Treating RN: Apr 28, 1955 (66 y.o. [name] Primary Care Provider: [name] Other Clinician: Referring Provider: Treating Provider/Extender: [name] in Treatment: 20 Information Obtained From Patient Constitutional Symptoms (General Health) Medical History: Past Medical History Notes: morbid obesity Eyes Medical History: Positive for: Cataracts Hematologic/Lymphatic Medical History: Positive for: Lymphedema Cardiovascular Medical History: Positive for: Hypertension; Peripheral Venous Disease Past Medical History Notes: schamberg disease, hyperlipidemia Gastrointestinal Medical History: Past Medical History Notes: diverticulitis , h/o obstruction due to diverticulitis , colostomy and colostomy reversal Endocrine Medical History: Past Medical  History Notes: hypothyroidism Integumentary (Skin) Medical History: Negative for: History of Burn  Musculoskeletal Medical History: Positive for: Osteoarthritis HBO Extended History Items Eyes: Cataracts Immunizations Pneumococcal Vaccine: Received Pneumococcal Vaccination: Yes Received Pneumococcal Vaccination On or After 60th Birthday: Yes Implantable Devices None Hospitalization / Surgery History Type of Hospitalization/Surgery colostomy reversal 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 Family and Social History Cancer: No; Diabetes: No; Heart Disease: Yes - Mother,Father; Hereditary Spherocytosis: No; Hypertension: Yes - Mother,Father; Kidney Disease: Yes - Mother; Lung Disease: No; Seizures: No; Stroke: No; Thyroid Problems: Yes - Mother; Tuberculosis: No; Never smoker; Marital Status - Single; Alcohol Use: Never; Drug Use: No History; Caffeine Use: Daily - coffee; Financial Concerns: No; Food, Clothing or Shelter Needs: No; Support System Lacking: No; Transportation Concerns: No Electronic Signature(s) Signed: 07/11/2021 10:54:03 AM By: [name] MD FACS Signed: 07/11/2021 4:40:05 PM By: [name] RN Entered By: [name] on 07/11/2021 09:50:17 -------------------------------------------------------------------------------- SuperBill Details Patient Name: Date of Service: Erin Face D. 07/11/2021 Medical Record Number: [id number] Patient Account Number: [id number] Date of Birth/Sex: Treating RN: [birth year] (66 y.o. [name] Primary Care Provider: [name] Other Clinician: Referring Provider: Treating Provider/Extender: [name] in Treatment: 20 Diagnosis Coding ICD-10 Codes Code Description [phone] Chronic venous hypertension (idiopathic) with ulcer and inflammation of left lower extremity L97.828 Non-pressure chronic ulcer of other part of left lower leg with other specified severity L97.811 Non-pressure  chronic ulcer of other part of right lower leg limited to breakdown of skin Facility Procedures CPT4 Code: [id number] Description: [phone] - [facility] WOUND 1ST 20 SQ CM OR < ICD-10 Diagnosis Description 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 Modifier: everity of skin Quantity: 1 Physician Procedures : CPT4 Code Description Modifier [id number] - WC PHYS LEVEL 3 - EST PT 25 ICD-10 Diagnosis Description 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 (idiopathic) with ulcer and inflammation of left lower extremity L97.828 Non-pressure chronic ulcer of other  part of left lower leg with other specified severity L97.811 Non-pressure chronic ulcer of other part of right lower leg limited to breakdown of skin Quantity: 1 : [id number] - WC PHYS DEBR WO ANESTH 20 SQ CM ICD-10 Diagnosis Description 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

## 2021-07-17 ENCOUNTER — Encounter (HOSPITAL_BASED_OUTPATIENT_CLINIC_OR_DEPARTMENT_OTHER): Payer: 59 | Attending: General Surgery | Admitting: General Surgery

## 2021-07-17 DIAGNOSIS — I872 Venous insufficiency (chronic) (peripheral): Secondary | ICD-10-CM | POA: Diagnosis not present

## 2021-07-17 DIAGNOSIS — L97828 Non-pressure chronic ulcer of other part of left lower leg with other specified severity: Secondary | ICD-10-CM | POA: Insufficient documentation

## 2021-07-17 DIAGNOSIS — I87332 Chronic venous hypertension (idiopathic) with ulcer and inflammation of left lower extremity: Secondary | ICD-10-CM | POA: Diagnosis not present

## 2021-07-17 DIAGNOSIS — L97822 Non-pressure chronic ulcer of other part of left lower leg with fat layer exposed: Secondary | ICD-10-CM | POA: Diagnosis not present

## 2021-07-17 DIAGNOSIS — Z6841 Body Mass Index (BMI) 40.0 and over, adult: Secondary | ICD-10-CM | POA: Insufficient documentation

## 2021-07-17 DIAGNOSIS — B9562 Methicillin resistant Staphylococcus aureus infection as the cause of diseases classified elsewhere: Secondary | ICD-10-CM | POA: Insufficient documentation

## 2021-07-17 DIAGNOSIS — E669 Obesity, unspecified: Secondary | ICD-10-CM | POA: Insufficient documentation

## 2021-07-17 DIAGNOSIS — I87322 Chronic venous hypertension (idiopathic) with inflammation of left lower extremity: Secondary | ICD-10-CM | POA: Diagnosis not present

## 2021-07-17 DIAGNOSIS — L97811 Non-pressure chronic ulcer of other part of right lower leg limited to breakdown of skin: Secondary | ICD-10-CM | POA: Diagnosis not present

## 2021-07-18 NOTE — Progress Notes (Signed)
TEKOA, HAMOR ([id number]) Visit Report for 07/17/2021 Arrival Information Details Patient Name: Date of Service: MARGARETMARY, PRISK 07/17/2021 10:15 A M Medical Record Number: [id number] Patient Account Number: [id number] Date of Birth/Sex: Treating RN: 1955/06/03 (66 y.o. Sue Lush Primary Care Rakesh Dutko: [name] Other Clinician: Referring Lashonne Shull: Treating Emmory Solivan/Extender: [name] in Treatment: 44 Visit Information History Since Last Visit Added or deleted any medications: No Patient Arrived: [name] Any new allergies or adverse reactions: No Arrival Time: 10:24 Had a fall or experienced change in No Transfer Assistance: None activities of daily living that may affect Patient Identification Verified: Yes risk of falls: Secondary Verification Process Completed: Yes Signs or symptoms of abuse/neglect since last visito No Patient Requires Transmission-Based Precautions: No Hospitalized since last visit: No Patient Has Alerts: No Implantable device outside of the clinic excluding No cellular tissue based products placed in the center since last visit: Has Dressing in Place as Prescribed: Yes Has Compression in Place as Prescribed: Yes Pain Present Now: No Electronic Signature(s) Signed: 07/17/2021 3:49:35 PM By: Lorrin Jackson Entered By: Lorrin Jackson on 07/17/2021 10:28:23 -------------------------------------------------------------------------------- Compression Therapy Details Patient Name: Date of Service: [name] 07/17/2021 10:15 A M Medical Record Number: [id number] Patient Account Number: [id number] Date of Birth/Sex: Treating RN: 1955-12-09 (66 y.o. Sue Lush Primary Care Louella Medaglia: [name] Other Clinician: Referring Omaya Nieland: Treating Reina Wilton/Extender: [name] in Treatment: 20 Compression Therapy Performed for Wound Assessment: Wound #3 Left,Lateral Lower  Leg Performed By: Clinician Lorrin Jackson, RN Compression Type: Double Layer Post Procedure Diagnosis Same as Pre-procedure Electronic Signature(s) Signed: 07/17/2021 3:49:35 PM By: Lorrin Jackson Entered By: Lorrin Jackson on 07/17/2021 10:48:54 -------------------------------------------------------------------------------- Compression Therapy Details Patient Name: Date of Service: [name] 07/17/2021 10:15 A M Medical Record Number: [id number] Patient Account Number: [id number] Date of Birth/Sex: Treating RN: 07-31-1955 (66 y.o. Sue Lush Primary Care Azriel Dancy: [name] Other Clinician: Referring Maurita Havener: Treating Phyllistine Domingos/Extender: [name] in Treatment: 20 Compression Therapy Performed for Wound Assessment: Wound #6 Right,Anterior Lower Leg Performed By: Clinician Lorrin Jackson, RN Compression Type: Double Layer Post Procedure Diagnosis Same as Pre-procedure Electronic Signature(s) Signed: 07/17/2021 3:49:35 PM By: Lorrin Jackson Entered By: Lorrin Jackson on 07/17/2021 10:52:25 -------------------------------------------------------------------------------- Encounter Discharge Information Details Patient Name: Date of Service: [name] 07/17/2021 10:15 A M Medical Record Number: [id number] Patient Account Number: [id number] Date of Birth/Sex: Treating RN: [birth year] (66 y.o. Sue Lush Primary Care Divante Kotch: [name] Other Clinician: Referring Zanovia Rotz: Treating Coni Homesley/Extender: [name] in Treatment: 20 Encounter Discharge Information Items Post Procedure Vitals Discharge Condition: Stable Temperature (F): 97.7 Ambulatory Status: Ambulatory Pulse (bpm): 91 Discharge Destination: Home Respiratory Rate (breaths/min): 18 Transportation: Private Auto Blood Pressure (mmHg): 122/76 Schedule Follow-up Appointment: Yes Clinical Summary of Care: Provided on  07/17/2021 Form Type Recipient Paper Patient Patient Electronic Signature(s) Signed: 07/17/2021 3:49:35 PM By: Lorrin Jackson Entered By: Lorrin Jackson on 07/17/2021 12:42:11 -------------------------------------------------------------------------------- Lower Extremity Assessment Details Patient Name: Date of Service: [place], RIESEN D. 07/17/2021 10:15 A M Medical Record Number: [id number] Patient Account Number: [id number] Date of Birth/Sex: Treating RN: Nov 09, 1955 (66 y.o. Sue Lush Primary Care Yehudit Fulginiti: [name] Other Clinician: Referring Josafat Enrico: Treating Jeffie Widdowson/Extender: [name] in Treatment: 20 Edema Assessment Assessed: [Left: Yes] [Right: Yes] Edema: [Left: Yes] [Right: Yes] Calf Left: Right: Point of Measurement: 29 cm From Medial Instep 40 cm 42.4 cm Ankle Left: Right: Point  of Measurement: 10 cm From Medial Instep 24.5 cm 26.4 cm Vascular Assessment Pulses: Dorsalis Pedis Palpable: [Left:Yes] [Right:Yes] Electronic Signature(s) Signed: 07/17/2021 3:49:35 PM By: Lorrin Jackson Entered By: Lorrin Jackson on 07/17/2021 10:35:00 -------------------------------------------------------------------------------- Multi Wound Chart Details Patient Name: Date of Service: [name] 07/17/2021 10:15 A M Medical Record Number: [id number] Patient Account Number: [id number] Date of Birth/Sex: Treating RN: 15-Nov-1955 (66 y.o. [name] Primary Care [name]: [name] Other Clinician: Referring Mieczyslaw Stamas: Treating Trevante Tennell/Extender: [name] in Treatment: 20 Vital Signs Height(in): 66 Pulse(bpm): 82 Weight(lbs): 226 Blood Pressure(mmHg): 122/76 Body Mass Index(BMI): 41.3 Temperature(F): 97.7 Respiratory Rate(breaths/min): 18 Photos: [N/A:N/A] Left, Lateral Lower Leg Right, Anterior Lower Leg N/A Wound Location: Blister Gradually Appeared N/A Wounding Event: Venous  Leg Ulcer Venous Leg Ulcer N/A Primary Etiology: Cataracts, Lymphedema, Cataracts, Lymphedema, N/A Comorbid History: Hypertension, Peripheral Venous Hypertension, Peripheral Venous Disease, Osteoarthritis Disease, Osteoarthritis 12/13/2020 06/20/2021 N/A Date Acquired: 20 2 N/A Weeks of Treatment: Open Open N/A Wound Status: No No N/A Wound Recurrence: No Yes N/A Clustered Wound: 1.7x2.2x0.2 0.2x0.2x0.1 N/A Measurements L x W x D (cm) 2.937 0.031 N/A A (cm) : rea 0.587 0.003 N/A Volume (cm) : 89.90% 0.00% N/A % Reduction in Area: 89.90% 0.00% N/A % Reduction in Volume: Full Thickness Without Exposed Full Thickness Without Exposed N/A Classification: Support Structures Support Structures Medium Medium N/A Exudate A mount: Serosanguineous Serosanguineous N/A Exudate Type: red, brown red, brown N/A Exudate Color: Distinct, outline attached Flat and Intact N/A Wound Margin: Large (67-100%) Large (67-100%) N/A Granulation A mount: Red, Pink Pink N/A Granulation Quality: Small (1-33%) None Present (0%) N/A Necrotic A mount: Fat Layer (Subcutaneous Tissue): Yes Fat Layer (Subcutaneous Tissue): Yes N/A Exposed Structures: Fascia: No Fascia: No Tendon: No Tendon: No Muscle: No Muscle: No Joint: No Joint: No Bone: No Bone: No Medium (34-66%) Large (67-100%) N/A Epithelialization: Debridement - Excisional N/A N/A Debridement: Pre-procedure Verification/Time Out 10:45 N/A N/A Taken: Other N/A N/A Pain Control: Subcutaneous N/A N/A Tissue Debrided: Skin/Subcutaneous Tissue N/A N/A Level: 3.74 N/A N/A Debridement A (sq cm): rea Curette N/A N/A Instrument: Minimum N/A N/A Bleeding: Pressure N/A N/A Hemostasis A chieved: Procedure was tolerated well N/A N/A Debridement Treatment Response: 1.7x2.2x0.2 N/A N/A Post Debridement Measurements L x W x D (cm) 0.587 N/A N/A Post Debridement Volume: (cm) Compression Therapy Compression Therapy  N/A Procedures Performed: Debridement Treatment Notes Electronic Signature(s) Signed: 07/17/2021 11:00:21 AM By: [name] MD FACS Signed: 07/18/2021 1:29:25 PM By: [name] RN Entered By: [name] on 07/17/2021 11:00:21 -------------------------------------------------------------------------------- Multi-Disciplinary Care Plan Details Patient Name: Date of Service: [name] 07/17/2021 10:15 A M Medical Record Number: [id number] Patient Account Number: [id number] Date of Birth/Sex: Treating RN: 04/01/1955 (66 y.o. Sue Lush Primary Care Kalani Baray: [name] Other Clinician: Referring Lewanna Petrak: Treating Zyler Hyson/Extender: [name] in Treatment: 20 Multidisciplinary Care Plan reviewed with physician Active Inactive Venous Leg Ulcer Nursing Diagnoses: Actual venous Insuffiency (use after diagnosis is confirmed) Goals: Patient will maintain optimal edema control Date Initiated: 02/21/2021 Target Resolution Date: 09/08/2021 Goal Status: Active Patient/caregiver will verbalize understanding of disease process and disease management Date Initiated: 02/21/2021 Date Inactivated: 03/29/2021 Target Resolution Date: 03/24/2021 Goal Status: Met Interventions: Assess peripheral edema status every visit. Compression as ordered Provide education on venous insufficiency Notes: Wound/Skin Impairment Nursing Diagnoses: Impaired tissue integrity Knowledge deficit related to ulceration/compromised skin integrity Goals: Patient/caregiver will verbalize understanding of skin care regimen Date Initiated: 02/21/2021 Target Resolution Date:  09/01/2021 Goal Status: Active Ulcer/skin breakdown will have a volume reduction of 30% by week 4 Date Initiated: 02/21/2021 Date Inactivated: 03/29/2021 Target Resolution Date: 03/24/2021 Goal Status: Unmet Unmet Reason: infection Ulcer/skin breakdown will have a volume reduction of 50% by  week 8 Date Initiated: 03/29/2021 Date Inactivated: 04/19/2021 Target Resolution Date: 04/21/2021 Goal Status: Met Interventions: Assess patient/caregiver ability to obtain necessary supplies Assess patient/caregiver ability to perform ulcer/skin care regimen upon admission and as needed Assess ulceration(s) every visit Provide education on ulcer and skin care Notes: Electronic Signature(s) Signed: 07/17/2021 3:49:35 PM By: Lorrin Jackson Entered By: Lorrin Jackson on 07/17/2021 10:23:45 -------------------------------------------------------------------------------- Pain Assessment Details Patient Name: Date of Service: [name] 07/17/2021 10:15 A M Medical Record Number: [id number] Patient Account Number: [id number] Date of Birth/Sex: Treating RN: October 27, 1955 (66 y.o. Sue Lush Primary Care Mayes Sangiovanni: [name] Other Clinician: Referring Jerlean Peralta: Treating Analiz Tvedt/Extender: [name] in Treatment: 20 Active Problems Location of Pain Severity and Description of Pain Patient Has Paino No Site Locations Pain Management and Medication Current Pain Management: Electronic Signature(s) Signed: 07/17/2021 3:49:35 PM By: Lorrin Jackson Entered By: Lorrin Jackson on 07/17/2021 10:28:48 -------------------------------------------------------------------------------- Patient/Caregiver Education Details Patient Name: Date of Service: [name] 6/5/2023andnbsp10:15 A M Medical Record Number: [id number] Patient Account Number: [id number] Date of Birth/Gender: Treating RN: 1956-01-20 (66 y.o. Sue Lush Primary Care Physician: [name] Other Clinician: Referring Physician: Treating Physician/Extender: [name] in Treatment: 20 Education Assessment Education Provided To: Patient Education Topics Provided Venous: Methods: Explain/Verbal, Printed Responses: State content  correctly Wound/Skin Impairment: Methods: Demonstration, Explain/Verbal, Printed Responses: State content correctly Electronic Signature(s) Signed: 07/17/2021 3:49:35 PM By: Lorrin Jackson Entered By: Lorrin Jackson on 07/17/2021 10:24:14 -------------------------------------------------------------------------------- Wound Assessment Details Patient Name: Date of Service: [name] 07/17/2021 10:15 A M Medical Record Number: [id number] Patient Account Number: [id number] Date of Birth/Sex: Treating RN: 15-Sep-1955 (66 y.o. Sue Lush Primary Care Buna Cuppett: [name] Other Clinician: Referring Abeera Flannery: Treating Taiquan Campanaro/Extender: [name] in Treatment: 20 Wound Status Wound Number: 3 Primary Venous Leg Ulcer Etiology: Wound Location: Left, Lateral Lower Leg Wound Open Wounding Event: Blister Status: Date Acquired: 12/13/2020 Comorbid Cataracts, Lymphedema, Hypertension, Peripheral Venous Weeks Of Treatment: 20 History: Disease, Osteoarthritis Clustered Wound: No Photos Wound Measurements Length: (cm) 1.7 Width: (cm) 2.2 Depth: (cm) 0.2 Area: (cm) 2.937 Volume: (cm) 0.587 % Reduction in Area: 89.9% % Reduction in Volume: 89.9% Epithelialization: Medium (34-66%) Tunneling: No Undermining: No Wound Description Classification: Full Thickness Without Exposed Support Structures Wound Margin: Distinct, outline attached Exudate Amount: Medium Exudate Type: Serosanguineous Exudate Color: red, brown Foul Odor After Cleansing: No Slough/Fibrino Yes Wound Bed Granulation Amount: Large (67-100%) Exposed Structure Granulation Quality: Red, Pink Fascia Exposed: No Necrotic Amount: Small (1-33%) Fat Layer (Subcutaneous Tissue) Exposed: Yes Necrotic Quality: Adherent Slough Tendon Exposed: No Muscle Exposed: No Joint Exposed: No Bone Exposed: No Treatment Notes Wound #3 (Lower Leg) Wound Laterality: Left,  Lateral Cleanser Soap and Water Discharge Instruction: May shower and wash wound with dial antibacterial soap and water prior to dressing change. Wound Cleanser Discharge Instruction: Cleanse the wound with wound cleanser prior to applying a clean dressing using gauze sponges, not tissue or cotton balls. Peri-Wound Care Zinc Oxide Ointment 30g tube Discharge Instruction: Apply Zinc Oxide to periwound with each dressing change Sween Lotion (Moisturizing lotion) Discharge Instruction: Apply moisturizing lotion as directed Topical Primary Dressing KerraCel Ag Gelling Fiber Dressing, 4x5 in (silver alginate) Discharge  Instruction: Apply silver alginate to wound bed as instructed Secondary Dressing Zetuvit Plus 4x8 in Discharge Instruction: Apply over primary dressing as directed. Secured With Compression Wrap Unnaboot w/Calamine, 4x10 (in/yd) Discharge Instruction: Apply Unnaboot at top of leg CoFlex TLC XL 2-layer Compression System 4x7 (in/yd) Discharge Instruction: Apply CoFlex 2-layer compression as directed. (alt for 4 layer) Compression Stockings Add-Ons Electronic Signature(s) Signed: 07/17/2021 3:49:35 PM By: Lorrin Jackson Entered By: Lorrin Jackson on 07/17/2021 10:43:08 -------------------------------------------------------------------------------- Wound Assessment Details Patient Name: Date of Service: [name] 07/17/2021 10:15 A M Medical Record Number: [id number] Patient Account Number: [id number] Date of Birth/Sex: Treating RN: 02-09-1956 (66 y.o. Sue Lush Primary Care Ellisha Bankson: [name] Other Clinician: Referring Eilis Chestnutt: Treating Patte Winkel/Extender: [name] in Treatment: 20 Wound Status Wound Number: 6 Primary Venous Leg Ulcer Etiology: Wound Location: Right, Anterior Lower Leg Wound Open Wounding Event: Gradually Appeared Status: Date Acquired: 06/20/2021 Comorbid Cataracts, Lymphedema, Hypertension,  Peripheral Venous Weeks Of Treatment: 2 History: Disease, Osteoarthritis Clustered Wound: Yes Photos Wound Measurements Length: (cm) 0.2 Width: (cm) 0.2 Depth: (cm) 0.1 Area: (cm) 0.031 Volume: (cm) 0.003 % Reduction in Area: 0% % Reduction in Volume: 0% Epithelialization: Large (67-100%) Tunneling: No Undermining: No Wound Description Classification: Full Thickness Without Exposed Support Structures Wound Margin: Flat and Intact Exudate Amount: Medium Exudate Type: Serosanguineous Exudate Color: red, brown Foul Odor After Cleansing: No Slough/Fibrino No Wound Bed Granulation Amount: Large (67-100%) Exposed Structure Granulation Quality: Pink Fascia Exposed: No Necrotic Amount: None Present (0%) Fat Layer (Subcutaneous Tissue) Exposed: Yes Tendon Exposed: No Muscle Exposed: No Joint Exposed: No Bone Exposed: No Treatment Notes Wound #6 (Lower Leg) Wound Laterality: Right, Anterior Cleanser Soap and Water Discharge Instruction: May shower and wash wound with dial antibacterial soap and water prior to dressing change. Wound Cleanser Discharge Instruction: Cleanse the wound with wound cleanser prior to applying a clean dressing using gauze sponges, not tissue or cotton balls. Peri-Wound Care Topical Primary Dressing KerraCel Ag Gelling Fiber Dressing, 2x2 in (silver alginate) Discharge Instruction: Apply silver alginate to wound bed as instructed Secondary Dressing Zetuvit Plus 4x8 in Discharge Instruction: Apply over primary dressing as directed. Secured With Compression Wrap Unnaboot w/Calamine, 4x10 (in/yd) Discharge Instruction: Apply Unnaboot at top of the leg CoFlex TLC XL 2-layer Compression System 4x7 (in/yd) Discharge Instruction: Apply CoFlex 2-layer compression as directed. (alt for 4 layer) Compression Stockings Add-Ons Electronic Signature(s) Signed: 07/17/2021 3:49:35 PM By: Lorrin Jackson Entered By: Lorrin Jackson on 07/17/2021  10:49:40 -------------------------------------------------------------------------------- Vitals Details Patient Name: Date of Service: [name] 07/17/2021 10:15 A M Medical Record Number: [id number] Patient Account Number: [id number] Date of Birth/Sex: Treating RN: 1955/06/22 (66 y.o. Sue Lush Primary Care Sollie Vultaggio: [name] Other Clinician: Referring Shantai Tiedeman: Treating Furqan Gosselin/Extender: [name] in Treatment: 20 Vital Signs Time Taken: 10:28 Temperature (F): 97.7 Height (in): 62 Pulse (bpm): 91 Weight (lbs): 226 Respiratory Rate (breaths/min): 18 Body Mass Index (BMI): 41.3 Blood Pressure (mmHg): 122/76 Reference Range: 80 - 120 mg / dl Electronic Signature(s) Signed: 07/17/2021 3:49:35 PM By: Lorrin Jackson Entered By: Lorrin Jackson on 07/17/2021 10:28:41

## 2021-07-18 NOTE — Progress Notes (Signed)
MYRACLE, FEBRES ([id number]) Visit Report for 07/17/2021 Chief Complaint Document Details Patient Name: Date of Service: [name], [name] 07/17/2021 10:15 A M Medical Record Number: [id number] Patient Account Number: [id number] Date of Birth/Sex: Treating RN: Dec 27, 1955 (66 y.o. [name] Primary Care Provider: [name] Other Clinician: Referring Provider: Treating Provider/Extender: [name] in Treatment: 20 Information Obtained from: Patient Chief Complaint 04/19/2021: The patient is here for ongoing follow-up regarding 2 left lower extremity wounds. Electronic Signature(s) Signed: 07/17/2021 11:00:28 AM By: [name] MD FACS Entered By: [name] on 07/17/2021 11:00:28 -------------------------------------------------------------------------------- Debridement Details Patient Name: Date of Service: Erin Face D. 07/17/2021 10:15 A M Medical Record Number: [id number] Patient Account Number: [id number] Date of Birth/Sex: Treating RN: [birth year] (66 y.o. [name] Primary Care Provider: [name] Other Clinician: Referring Provider: Treating Provider/Extender: [name] in Treatment: 20 Debridement Performed for Assessment: Wound #3 Left,Lateral Lower Leg Performed By: Physician [name], MD Debridement Type: Debridement Severity of Tissue Pre Debridement: Fat layer exposed Level of Consciousness (Pre-procedure): Awake and Alert Pre-procedure Verification/Time Out Yes - 10:45 Taken: Start Time: 10:46 Pain Control: Other : Benzocaine T Area Debrided (L x W): otal 1.7 (cm) x 2.2 (cm) = 3.74 (cm) Tissue and other material debrided: Non-Viable, Subcutaneous Level: Skin/Subcutaneous Tissue Debridement Description: Excisional Instrument: Curette Bleeding: Minimum Hemostasis Achieved: Pressure End Time: 10:51 Response to Treatment: Procedure was tolerated well Level of  Consciousness (Post- Awake and Alert procedure): Post Debridement Measurements of Total Wound Length: (cm) 1.7 Width: (cm) 2.2 Depth: (cm) 0.2 Volume: (cm) 0.587 Character of Wound/Ulcer Post Debridement: Stable Severity of Tissue Post Debridement: Fat layer exposed Post Procedure Diagnosis Same as Pre-procedure Electronic Signature(s) Signed: 07/17/2021 11:15:03 AM By: [name] MD FACS Signed: 07/17/2021 3:49:35 PM By: Lorrin Jackson Entered By: Lorrin Jackson on 07/17/2021 10:52:11 -------------------------------------------------------------------------------- HPI Details Patient Name: Date of Service: Erin Face D. 07/17/2021 10:15 A M Medical Record Number: [id number] Patient Account Number: [id number] Date of Birth/Sex: Treating RN: October 11, 1955 (66 y.o. 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 She tells [name] that she fell on the gravel driveway in July. She developed injuries on her distal lower leg which have not healed. She saw her primary physician on 11/15/2017 who noted her left shin injuries. Gave her antibiotics. At that point the wounds were almost circumferential however most were less than 1.5 cm. Weeping edema fluid was noted. She was referred here for evaluation. The patient has a history of chronic lower extremity edema. 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 She has had prior history with leg weeping fluid. She does not wear compression stockings. She is not doing anything specific to these wound areas. The patient has a history of obesity, arthritis, peripheral vascular  disease hypertension lower extremity edema and Schamberg's disease ABI in our clinic was 1.3 on the left 12/17/2017; patient readmitted to the clinic last week. 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 She also has lymphedema. Put her in 3 layer compression and silver alginate last week. She has 3 small wounds with depth just lateral to the tibia. More problematically  than this she has numerous shallow areas some of which are almost canal like in shape with tightly adherent painful debris. It would be very difficult and time- consuming to go through this and attempt to individually debride all these areas.. I changed her to collagen today to see if that would help with any of the surface debris on some of these wounds. Otherwise we will not be able to put this in compression we have to have someone change the dressing. The patient is not eligible for home health 12/25/17 on evaluation today patient actually appears to be doing rather well in regard to the ulcer on her lower extremity. Fortunately there does not appear to be evidence of infection at this time. She has been tolerating the dressing changes without complication. This includes the compression wrap. The only issue she had was that the wrap was initially placed over her bunion region which actually calls her some discomfort and pain. Other than that things seem to be going rather well. 01/01/2018 Seen today for follow-up and management of left lower extremity wound and lymphedema. T oday she presents with a new wound towards to the left lateral LE. Recently treated with a 7 day course of amoxicillin; reason for antibiotic dose is unknown at this time. Tolerating current treatment of collagen with 4- layer wraps. She obtained a venous reflux study on 12/25/17. Studies show on the right abnormal reflux times of the popliteal vein, great saphenous vein at the saphenofemoral junction at the  proximal thigh, great saphenous vein at the mid calf, and origin of the small saphenous vein.No superficial thrombosis. No deep vein thrombosis in the common femoral, femoral,and popliteal veins. Left abnormal reflex times as well of the common femoral vein, popliteal vein, and a great saphenous vein at the saphenofemoral junction, In great saphenous vein at the mid thigh w/o thrombosis. Has any issues or concerns during visit today. Recommended follow-up to vascular specialist due to abnormalities from the venous reflux study. Denies fever, pain, chills, dizziness, nausea, or vomiting. 01/08/18 upon evaluation today the patient actually seems to be showing some signs of improvement in my opinion at this point in regard to the lower extremity ulcerated areas. She still has a lot of drainage but fortunately nothing that appears to be too significant currently. I have been very happy with the overall progress I see today compared to where things were during the last evaluation that I had with her. Nonetheless she has not had her appointment with the vein specialist as of yet in fact we were able to get this approved for her today and confirmed with them she will be seeing them on December 26. Nonetheless in general I do feel like the compression wraps is doing well for her. 01/17/18; quite a bit of improvement since last time I saw this patient she has a small open area remaining on the left lateral calf and even smaller area medially. She has lymphedema chronic stasis changes with distal skin fibrosis. She has an appointment with vascular surgery later this month 01/24/2018; the patient's medial leg has closed. Still a small open area on the left lateral leg. She states that the 4 layer compression we put on last week was too tight and she had to take it off over a few days ago. We have had resultant increase in her lymphedema in the dorsal foot and a proximal calf. Fortunately that does not seem to have  resulted in any deterioration in her wounds The patient  is going to need compression stockings. We have given her measurements to phone elastic therapy in [place]. She has vascular surgery consult on December 26 02/07/18; she's had continuous contraction on the left lateral leg wound which is now very small. Currently using silver alginate under 3 layer compression She saw Dr. Trula Slade of vascular surgery on 02/06/18. It was noted that she had a normal reflux times in the popliteal vein, great saphenous vein at the saphenofemoral junction, great saphenous vein at the proximal thigh great saphenous vein at the mid calf and origin of the small saphenous vein. It was noted that she had significant reflux in the left saphenous veins with diameter measurements in the 0.7-0.8 cm range. It was felt she would benefit from laser ablation to help minimize the risk of ulcer recurrence. It was recommended that she wear 20-30 thigh-high compression stockings and have follow-up in 4-6 weeks 02/17/2018; I thought this lady would be healed however her compression slipped down and she developed increasing swelling and the wound is actually larger. 1/13; we had deterioration last week after the patient's compression slipped down and she developed periwound swelling. I increased her compression before layers we have been using silver alginate we are a lot better again today. She has her compression stockings in waiting 1/23; the patient's wounds are totally healed today. She has her stockings. This was almost circumferential skin damage. She follows up with Dr. Trula Slade of vascular surgery next Monday. READMISSION 02/21/2021 This is a now 66 year old woman that we had in clinic here discharging in January 2020 with wounds on her left calf chronic venous insufficiency. She was discharged with 30/40 stockings. It does not sound like she has worn stockings in about a year largely from not being able to get them on herself.  In November she developed new blisters on her legs an area laterally is opened into a fairly sizable wound. She has weeping posteriorly as well. She has been using Neosporin and Band-Aids. The patient did see Dr. Trula Slade in 2019 and 2020. He felt she might benefit from laser ablation in her left saphenous veins although because of the wound that healed I do not think he went through with it. She might benefit from seeing him again. Her ABI on the left is 1. 1/17; patient's wound on the posterior left calf is closed she has the 2 large areas last week. We put her in 4-layer compression for edema control is a lot better. Our intake nurse noted greenish drainage and odor. We have been using silver alginate 1/25; PCR culture I did have the substantial wound area on the left lateral lower leg showed staff aureus and group A strep. Low titers of coag negative staph which are probably skin contaminants. Resistance detected to tetracycline methicillin and macrolides. I gave her a starter kit of Nuzyra 150 mg x 3 for 2 days then 300 mg for a further 7 days. Marked odor considerable increase in surrounding erythema. We are using Iodoflex last week however I have changed to silver alginate with underlying Bactroban 2/1; she is completing her [place] tomorrow. The degree of erythema around the wounds looks a lot better. Odor has improved. I gave her silver alginate and Bactroban last week and changing her back to Iodoflex to continue with ongoing debridement 2/8; the periwound looks a lot better. Surface of the wound also looks somewhat better although there is still ongoing debridement to be done we have been using Iodoflex under compression. Primary dressing and silver  alginate 2/15; left posterior calf. Some improvement in the surface of the wound but still very gritty we have been using Iodoflex under compression. 04/05/2021: Left lateral posterior calf. She continues to have significant amounts of drainage,  some of which is probably comprised of the Iodoflex microbleeds. There is no odor to the drainage. The satellite lesion on the more posterior aspect of the calf is epithelializing nicely and has contracted quite a bit. There is robust granulation tissue in the dominant wound with minimal adherent slough. 04/12/2021: The satellite lesion has nearly closed. She continues to have good granulation tissue with minimal slough of the larger primary wound. She continues to have a fair amount of drainage, but I think this is less secondary to switching her dressing from Iodoflex to Prisma. 04/19/2021: The satellite lesion is almost completely epithelialized. The larger primary wound continues to contract with good granulation tissue and minimal slough. She is currently in [address] with compression. 04/26/2021: The satellite lesion has closed. The larger primary wound has contracted further and the granulation tissue is robust without being hypertrophic. Minimal slough is present. 05/03/2021: The satellite lesion remains closed. The larger primary wound has a bit of slough, but has also contracted further with good perimeter epithelialization. Good granulation tissue at the wound surface. There was some greenish drainage on the dressing when it was removed; it is not the blue-green typically associated with Pseudomonas aeruginosa, however. 05/10/2021: The primary wound continues to contract. There is minimal slough. The granulation tissue at 9:00 is a bit hypertrophic. No significant drainage. No odor. 05/17/2021: The wound is a little bit smaller today with good granulation tissue. Minimal slough. No significant drainage or odor. 05/24/2021: The wound continues to contract and has a nice base of granulation tissue. Small amount of slough. No concern for infection. 05/31/2021: For some reason, the wound measured slightly larger today but overall it still appears to be in good condition with a nice base of granulation  tissue and minimal slough. 06/07/2021: The wound is smaller today. Good granulation tissue and minimal slough. 06/14/2021: The wound is unchanged in size. She continues to accumulate some slough. Good granulation tissue on the surface. 06/20/2021: The wound is smaller today. Minimal slough with good granulation tissue. 06/27/2021: The wound on her left lateral leg is smaller today with just a bit of slough and eschar accumulation. Unfortunately, she has opened a new superficial wound on her right lower extremity. She has 3+ pitting edema to the knees on that leg and she does not wear compression stockings. 07/04/2021: In addition to the superficial wound on her right lower extremity that she opened up last week, she has opened 3 additional sites on the same leg. Her compression wraps were clearly not in correct position when she came to clinic today as they were at about the mid calf level rather than to the tibial tuberosity. She says that they slipped earlier and she had to come back on Friday to have them redone. The wound on her left lateral leg is perhaps slightly larger with some slough accumulation. 07/11/2021: The superficial wounds on her right lower extremity are nearly closed. They just have a thin layer of eschar overlying them. The wound on her left lateral leg is a little bit shallower and has a bit of slough accumulation. 07/17/2021: The right medial lower extremity leg wounds have almost completely closed; one of them just has a tiny opening with a little bit of serous drainage. The left lateral leg wound is  really unchanged. It seems to be stalled. Electronic Signature(s) Signed: 07/17/2021 11:04:47 AM By: [name] MD FACS Entered By: [name] on 07/17/2021 11:04:46 -------------------------------------------------------------------------------- Physical Exam Details Patient Name: Date of Service: Erin Face D. 07/17/2021 10:15 A M Medical Record Number:  [id number] Patient Account Number: [id number] Date of Birth/Sex: Treating RN: [birth year] (66 y.o. [name] Primary Care Provider: [name] Other Clinician: Referring Provider: Treating Provider/Extender: [name] in Treatment: 20 Constitutional . . . . No acute distress. Respiratory Normal work of breathing on room air. Notes 07/17/2021: The right medial lower extremity leg wounds have almost completely closed; one of them just has a tiny opening with a little bit of serous drainage. The left lateral leg wound is really unchanged. It seems to be stalled. Electronic Signature(s) Signed: 07/17/2021 11:05:50 AM By: [name] MD FACS Entered By: [name] on 07/17/2021 11:05:50 -------------------------------------------------------------------------------- Physician Orders Details Patient Name: Date of Service: Erin Gerold [name] 07/17/2021 10:15 A M Medical Record Number: [id number] Patient Account Number: [id number] Date of Birth/Sex: Treating RN: 20-Jul-1955 (66 y.o. [name] Primary Care Provider: [name] Other Clinician: Referring Provider: Treating Provider/Extender: [name] in Treatment: 59 Verbal / Phone Orders: No Diagnosis Coding ICD-10 Coding Code Description [phone] Chronic venous hypertension (idiopathic) with ulcer and inflammation of left lower extremity L97.828 Non-pressure chronic ulcer of other part of left lower leg with other specified severity L97.811 Non-pressure chronic ulcer of other part of right lower leg limited to breakdown of skin Follow-up Appointments ppointment in 1 week. - Dr. Celine Ahr - Room 3 07/24/21 @ 1:15pm Return A Bathing/ Shower/ Hygiene May shower with protection but do not get wound dressing(s) wet. - Ok to use [employer], can purchase at [facility], [facility], or [employer] Edema Control - Lymphedema / SCD / Other Elevate legs to the level of the  heart or above for 30 minutes daily and/or when sitting, a frequency of: - throughout the day Avoid standing for long periods of time. Exercise regularly Compression stocking or Garment 20-30 mm/Hg pressure to: - Brochure for Elastic Therapy. Leg measurements included Wound Treatment Wound #3 - Lower Leg Wound Laterality: Left, Lateral Cleanser: Soap and Water 1 x Per Week/30 Days Discharge Instructions: May shower and wash wound with dial antibacterial soap and water prior to dressing change. Cleanser: Wound Cleanser 1 x Per Week/30 Days Discharge Instructions: Cleanse the wound with wound cleanser prior to applying a clean dressing using gauze sponges, not tissue or cotton balls. Peri-Wound Care: Zinc Oxide Ointment 30g tube 1 x Per Week/30 Days Discharge Instructions: Apply Zinc Oxide to periwound with each dressing change Peri-Wound Care: Sween Lotion (Moisturizing lotion) 1 x Per Week/30 Days Discharge Instructions: Apply moisturizing lotion as directed Prim Dressing: KerraCel Ag Gelling Fiber Dressing, 4x5 in (silver alginate) 1 x Per Week/30 Days ary Discharge Instructions: Apply silver alginate to wound bed as instructed Secondary Dressing: Zetuvit Plus 4x8 in 1 x Per Week/30 Days Discharge Instructions: Apply over primary dressing as directed. Compression Wrap: Unnaboot w/Calamine, 4x10 (in/yd) 1 x Per Week/30 Days Discharge Instructions: Apply Unnaboot at top of leg Compression Wrap: CoFlex TLC XL 2-layer Compression System 4x7 (in/yd) 1 x Per Week/30 Days Discharge Instructions: Apply CoFlex 2-layer compression as directed. (alt for 4 layer) Wound #6 - Lower Leg Wound Laterality: Right, Anterior Cleanser: Soap and Water 1 x Per Week/30 Days Discharge Instructions: May shower and wash wound with dial antibacterial soap and water prior  to dressing change. Cleanser: Wound Cleanser 1 x Per Week/30 Days Discharge Instructions: Cleanse the wound with wound cleanser prior to applying a  clean dressing using gauze sponges, not tissue or cotton balls. Prim Dressing: KerraCel Ag Gelling Fiber Dressing, 2x2 in (silver alginate) 1 x Per Week/30 Days ary Discharge Instructions: Apply silver alginate to wound bed as instructed Secondary Dressing: Zetuvit Plus 4x8 in 1 x Per Week/30 Days Discharge Instructions: Apply over primary dressing as directed. Compression Wrap: Unnaboot w/Calamine, 4x10 (in/yd) 1 x Per Week/30 Days Discharge Instructions: Apply Unnaboot at top of the leg Compression Wrap: CoFlex TLC XL 2-layer Compression System 4x7 (in/yd) 1 x Per Week/30 Days Discharge Instructions: Apply CoFlex 2-layer compression as directed. (alt for 4 layer) Electronic Signature(s) Signed: 07/17/2021 11:06:05 AM By: [name] MD FACS Entered By: [name] on 07/17/2021 11:06:04 -------------------------------------------------------------------------------- Problem List Details Patient Name: Date of Service: Erin Face D. 07/17/2021 10:15 A M Medical Record Number: [id number] Patient Account Number: [id number] Date of Birth/Sex: Treating RN: [birth year] (66 y.o. [name] Primary Care Provider: [name] Other Clinician: Referring Provider: Treating Provider/Extender: [name] in Treatment: 20 Active Problems ICD-10 Encounter Code Description Active Date MDM Diagnosis 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 (idiopathic) with ulcer and inflammation of left 02/21/2021 No Yes lower extremity L97.828 Non-pressure chronic ulcer of other part of left lower leg with other specified 02/21/2021 No Yes severity L97.811 Non-pressure chronic ulcer of other part of right lower leg limited to breakdown 06/27/2021 No Yes of skin Inactive Problems ICD-10 Code Description Active Date Inactive Date L03.116 Cellulitis of left lower limb 03/08/2021 03/08/2021 Resolved Problems Electronic Signature(s) Signed: 07/17/2021 11:00:14 AM By:  [name] MD FACS Entered By: [name] on 07/17/2021 11:00:13 -------------------------------------------------------------------------------- Progress Note Details Patient Name: Date of Service: Erin Face D. 07/17/2021 10:15 A M Medical Record Number: [id number] Patient Account Number: [id number] Date of Birth/Sex: Treating RN: Jul 10, 1955 (66 y.o. [name] Primary Care Provider: [name] Other Clinician: Referring Provider: Treating Provider/Extender: [name] in Treatment: 20 Subjective Chief Complaint Information obtained from Patient 04/19/2021: The patient is here for ongoing follow-up regarding 2 left lower extremity wounds. 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 She tells [name] that she fell on the gravel driveway in July. She developed injuries on her distal lower leg which have not healed. She saw her primary physician on 11/15/2017 who noted her left shin injuries. Gave her antibiotics. At that point the wounds were almost circumferential however most were less than 1.5 cm. Weeping edema fluid was noted. She was referred here for evaluation. The patient has a history of chronic lower extremity edema. 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 She has had prior history with leg weeping fluid. She does not wear compression stockings. She is not doing anything specific to these wound areas. 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 She also  has lymphedema. Put her in 3 layer compression and silver alginate last week. She has 3 small wounds with depth just lateral to the tibia.  More problematically than this she has numerous shallow areas some of which are almost canal like in shape with tightly adherent painful debris. It would be very difficult and time- consuming to go through this and attempt to individually debride all these areas.. I changed her to collagen today to see if that would help with any of the surface debris on some of these wounds. Otherwise we will not be able to put this in compression we have to have someone change the dressing. The patient is not eligible for home health 12/25/17 on evaluation today patient actually appears to be doing rather well in regard to the ulcer on her lower extremity. Fortunately there does not appear to be evidence of infection at this time. She has been tolerating the dressing changes without complication. This includes the compression wrap. The only issue she had was that the wrap was initially placed over her bunion region which actually calls her some discomfort and pain. Other than that things seem to be going rather well. 01/01/2018 Seen today for follow-up and management of left lower extremity wound and lymphedema. T oday she presents with a new wound towards to the left lateral LE. Recently treated with a 7 day course of amoxicillin; reason for antibiotic dose is unknown at this time. Tolerating current treatment of collagen with 4- layer wraps. She obtained a venous reflux study on 12/25/17. Studies show on the right abnormal reflux times of the popliteal vein, great saphenous vein at the saphenofemoral junction at the proximal thigh, great saphenous vein at the mid calf, and origin of the small saphenous vein.No superficial thrombosis. No deep vein thrombosis in the common femoral, femoral,and popliteal veins. Left abnormal reflex times as well of the common femoral vein,  popliteal vein, and a great saphenous vein at the saphenofemoral junction, In great saphenous vein at the mid thigh w/o thrombosis. Has any issues or concerns during visit today. Recommended follow-up to vascular specialist due to abnormalities from the venous reflux study. Denies fever, pain, chills, dizziness, nausea, or vomiting. 01/08/18 upon evaluation today the patient actually seems to be showing some signs of improvement in my opinion at this point in regard to the lower extremity ulcerated areas. She still has a lot of drainage but fortunately nothing that appears to be too significant currently. I have been very happy with the overall progress I see today compared to where things were during the last evaluation that I had with her. Nonetheless she has not had her appointment with the vein specialist as of yet in fact we were able to get this approved for her today and confirmed with them she will be seeing them on December 26. Nonetheless in general I do feel like the compression wraps is doing well for her. 01/17/18; quite a bit of improvement since last time I saw this patient she has a small open area remaining on the left lateral calf and even smaller area medially. She has lymphedema chronic stasis changes with distal skin fibrosis. She has an appointment with vascular surgery later this month 01/24/2018; the patient's medial leg has closed. Still a small open area on the left lateral leg. She states that the 4 layer compression we put on last week was too tight and she had to take it off over a few days ago. We have had resultant increase in her lymphedema in the dorsal foot and a proximal calf. Fortunately that does not seem to have resulted in any deterioration in her wounds The  patient is going to need compression stockings. We have given her measurements to phone elastic therapy in [place]. She has vascular surgery consult on December 26 02/07/18; she's had continuous contraction  on the left lateral leg wound which is now very small. Currently using silver alginate under 3 layer compression She saw Dr. Trula Slade of vascular surgery on 02/06/18. It was noted that she had a normal reflux times in the popliteal vein, great saphenous vein at the saphenofemoral junction, great saphenous vein at the proximal thigh great saphenous vein at the mid calf and origin of the small saphenous vein. It was noted that she had significant reflux in the left saphenous veins with diameter measurements in the 0.7-0.8 cm range. It was felt she would benefit from laser ablation to help minimize the risk of ulcer recurrence. It was recommended that she wear 20-30 thigh-high compression stockings and have follow-up in 4-6 weeks 02/17/2018; I thought this lady would be healed however her compression slipped down and she developed increasing swelling and the wound is actually larger. 1/13; we had deterioration last week after the patient's compression slipped down and she developed periwound swelling. I increased her compression before layers we have been using silver alginate we are a lot better again today. She has her compression stockings in waiting 1/23; the patient's wounds are totally healed today. She has her stockings. This was almost circumferential skin damage. She follows up with Dr. Trula Slade of vascular surgery next Monday. READMISSION 02/21/2021 This is a now 66 year old woman that we had in clinic here discharging in January 2020 with wounds on her left calf chronic venous insufficiency. She was discharged with 30/40 stockings. It does not sound like she has worn stockings in about a year largely from not being able to get them on herself. In November she developed new blisters on her legs an area laterally is opened into a fairly sizable wound. She has weeping posteriorly as well. She has been using Neosporin and Band-Aids. The patient did see Dr. Trula Slade in 2019 and 2020. He felt she might  benefit from laser ablation in her left saphenous veins although because of the wound that healed I do not think he went through with it. She might benefit from seeing him again. Her ABI on the left is 1. 1/17; patient's wound on the posterior left calf is closed she has the 2 large areas last week. We put her in 4-layer compression for edema control is a lot better. Our intake nurse noted greenish drainage and odor. We have been using silver alginate 1/25; PCR culture I did have the substantial wound area on the left lateral lower leg showed staff aureus and group A strep. Low titers of coag negative staph which are probably skin contaminants. Resistance detected to tetracycline methicillin and macrolides. I gave her a starter kit of Nuzyra 150 mg x 3 for 2 days then 300 mg for a further 7 days. Marked odor considerable increase in surrounding erythema. We are using Iodoflex last week however I have changed to silver alginate with underlying Bactroban 2/1; she is completing her [place] tomorrow. The degree of erythema around the wounds looks a lot better. Odor has improved. I gave her silver alginate and Bactroban last week and changing her back to Iodoflex to continue with ongoing debridement 2/8; the periwound looks a lot better. Surface of the wound also looks somewhat better although there is still ongoing debridement to be done we have been using Iodoflex under compression. Primary dressing  and silver alginate 2/15; left posterior calf. Some improvement in the surface of the wound but still very gritty we have been using Iodoflex under compression. 04/05/2021: Left lateral posterior calf. She continues to have significant amounts of drainage, some of which is probably comprised of the Iodoflex microbleeds. There is no odor to the drainage. The satellite lesion on the more posterior aspect of the calf is epithelializing nicely and has contracted quite a bit. There is robust granulation tissue in  the dominant wound with minimal adherent slough. 04/12/2021: The satellite lesion has nearly closed. She continues to have good granulation tissue with minimal slough of the larger primary wound. She continues to have a fair amount of drainage, but I think this is less secondary to switching her dressing from Iodoflex to Prisma. 04/19/2021: The satellite lesion is almost completely epithelialized. The larger primary wound continues to contract with good granulation tissue and minimal slough. She is currently in [place] with compression. 04/26/2021: The satellite lesion has closed. The larger primary wound has contracted further and the granulation tissue is robust without being hypertrophic. Minimal slough is present. 05/03/2021: The satellite lesion remains closed. The larger primary wound has a bit of slough, but has also contracted further with good perimeter epithelialization. Good granulation tissue at the wound surface. There was some greenish drainage on the dressing when it was removed; it is not the blue-green typically associated with Pseudomonas aeruginosa, however. 05/10/2021: The primary wound continues to contract. There is minimal slough. The granulation tissue at 9:00 is a bit hypertrophic. No significant drainage. No odor. 05/17/2021: The wound is a little bit smaller today with good granulation tissue. Minimal slough. No significant drainage or odor. 05/24/2021: The wound continues to contract and has a nice base of granulation tissue. Small amount of slough. No concern for infection. 05/31/2021: For some reason, the wound measured slightly larger today but overall it still appears to be in good condition with a nice base of granulation tissue and minimal slough. 06/07/2021: The wound is smaller today. Good granulation tissue and minimal slough. 06/14/2021: The wound is unchanged in size. She continues to accumulate some slough. Good granulation tissue on the surface. 06/20/2021: The wound is  smaller today. Minimal slough with good granulation tissue. 06/27/2021: The wound on her left lateral leg is smaller today with just a bit of slough and eschar accumulation. Unfortunately, she has opened a new superficial wound on her right lower extremity. She has 3+ pitting edema to the knees on that leg and she does not wear compression stockings. 07/04/2021: In addition to the superficial wound on her right lower extremity that she opened up last week, she has opened 3 additional sites on the same leg. Her compression wraps were clearly not in correct position when she came to clinic today as they were at about the mid calf level rather than to the tibial tuberosity. She says that they slipped earlier and she had to come back on Friday to have them redone. The wound on her left lateral leg is perhaps slightly larger with some slough accumulation. 07/11/2021: The superficial wounds on her right lower extremity are nearly closed. They just have a thin layer of eschar overlying them. The wound on her left lateral leg is a little bit shallower and has a bit of slough accumulation. 07/17/2021: The right medial lower extremity leg wounds have almost completely closed; one of them just has a tiny opening with a little bit of serous drainage. The left lateral leg  wound is really unchanged. It seems to be stalled. Patient History Information obtained from Patient. Family History Heart Disease - Mother,Father, Hypertension - Mother,Father, Kidney Disease - Mother, Thyroid Problems - Mother, No family history of Cancer, Diabetes, Hereditary Spherocytosis, Lung Disease, Seizures, Stroke, Tuberculosis. Social History Never smoker, Marital Status - Single, Alcohol Use - Never, Drug Use - No History, Caffeine Use - Daily - coffee. Medical History Eyes Patient has history of Cataracts Hematologic/Lymphatic Patient has history of Lymphedema Cardiovascular Patient has history of Hypertension, Peripheral Venous  Disease Integumentary (Skin) Denies history of History of Burn Musculoskeletal Patient has history of Osteoarthritis Hospitalization/Surgery History - colostomy reversal. 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 Medical A Surgical History Notes nd Constitutional Symptoms (General Health) morbid obesity Cardiovascular schamberg disease, hyperlipidemia Gastrointestinal diverticulitis , h/o obstruction due to diverticulitis , colostomy and colostomy reversal Endocrine hypothyroidism Objective Constitutional No acute distress. Vitals Time Taken: 10:28 AM, Height: 62 in, Weight: 226 lbs, BMI: 41.3, Temperature: 97.7 F, Pulse: 91 bpm, Respiratory Rate: 18 breaths/min, Blood Pressure: 122/76 mmHg. Respiratory Normal work of breathing on room air. General Notes: 07/17/2021: The right medial lower extremity leg wounds have almost completely closed; one of them just has a tiny opening with a little bit of serous drainage. The left lateral leg wound is really unchanged. It seems to be stalled. Integumentary (Hair, Skin) Wound #3 status is Open. Original cause of wound was Blister. The date acquired was: 12/13/2020. The wound has been in treatment 20 weeks. The wound is located on the Left,Lateral Lower Leg. The wound measures 1.7cm length x 2.2cm width x 0.2cm depth; 2.937cm^2 area and 0.587cm^3 volume. There is Fat Layer (Subcutaneous Tissue) exposed. There is no tunneling or undermining noted. There is a medium amount of serosanguineous drainage noted. The wound margin is distinct with the outline attached to the wound base. There is large (67-100%) red, pink granulation within the wound bed. There is a small (1-33%) amount of necrotic tissue within the wound bed including Adherent Slough. Wound #6 status is Open. Original cause of wound was Gradually Appeared. The date acquired was: 06/20/2021. The wound has been in treatment 2 weeks. The wound is located on the Right,Anterior Lower Leg. The wound  measures 0.2cm length x 0.2cm width x 0.1cm depth; 0.031cm^2 area and 0.003cm^3 volume. There is Fat Layer (Subcutaneous Tissue) exposed. There is no tunneling or undermining noted. There is a medium amount of serosanguineous drainage noted. The wound margin is flat and intact. There is large (67-100%) pink granulation within the wound bed. There is no necrotic tissue within the wound bed. Assessment Active Problems ICD-10 Chronic venous hypertension (idiopathic) with ulcer and inflammation of left lower extremity Non-pressure chronic ulcer of other part of left lower leg with other specified severity Non-pressure chronic ulcer of other part of right lower leg limited to breakdown of skin Procedures Wound #3 Pre-procedure diagnosis of Wound #3 is a Venous Leg Ulcer located on the Left,Lateral Lower Leg .Severity of Tissue Pre Debridement is: Fat layer exposed. There was a Excisional Skin/Subcutaneous Tissue Debridement with a total area of 3.74 sq cm performed by [name], MD. With the following instrument(s): Curette to remove Non-Viable tissue/material. Material removed includes Subcutaneous Tissue after achieving pain control using Other (Benzocaine). No specimens were taken. A time out was conducted at 10:45, prior to the start of the procedure. A Minimum amount of bleeding was controlled with Pressure. The procedure was tolerated well. Post Debridement Measurements: 1.7cm length x 2.2cm width x 0.2cm  depth; 0.587cm^3 volume. Character of Wound/Ulcer Post Debridement is stable. Severity of Tissue Post Debridement is: Fat layer exposed. Post procedure Diagnosis Wound #3: Same as Pre-Procedure Pre-procedure diagnosis of Wound #3 is a Venous Leg Ulcer located on the Left,Lateral Lower Leg . There was a Double Layer Compression Therapy Procedure by Lorrin Jackson, RN. Post procedure Diagnosis Wound #3: Same as Pre-Procedure Wound #6 Pre-procedure diagnosis of Wound #6 is a Venous Leg  Ulcer located on the Right,Anterior Lower Leg . There was a Double Layer Compression Therapy Procedure by Lorrin Jackson, RN. Post procedure Diagnosis Wound #6: Same as Pre-Procedure Plan Follow-up Appointments: Return Appointment in 1 week. - Dr. Celine Ahr - Room 3 07/24/21 @ 1:15pm Bathing/ Shower/ Hygiene: May shower with protection but do not get wound dressing(s) wet. - Ok to use Market researcher, can purchase at [facility], [facility], or [employer] Edema Control - Lymphedema / SCD / Other: Elevate legs to the level of the heart or above for 30 minutes daily and/or when sitting, a frequency of: - throughout the day Avoid standing for long periods of time. Exercise regularly Compression stocking or Garment 20-30 mm/Hg pressure to: - Brochure for Elastic Therapy. Leg measurements included WOUND #3: - Lower Leg Wound Laterality: Left, Lateral Cleanser: Soap and Water 1 x Per Week/30 Days Discharge Instructions: May shower and wash wound with dial antibacterial soap and water prior to dressing change. Cleanser: Wound Cleanser 1 x Per Week/30 Days Discharge Instructions: Cleanse the wound with wound cleanser prior to applying a clean dressing using gauze sponges, not tissue or cotton balls. Peri-Wound Care: Zinc Oxide Ointment 30g tube 1 x Per Week/30 Days Discharge Instructions: Apply Zinc Oxide to periwound with each dressing change Peri-Wound Care: Sween Lotion (Moisturizing lotion) 1 x Per Week/30 Days Discharge Instructions: Apply moisturizing lotion as directed Prim Dressing: KerraCel Ag Gelling Fiber Dressing, 4x5 in (silver alginate) 1 x Per Week/30 Days ary Discharge Instructions: Apply silver alginate to wound bed as instructed Secondary Dressing: Zetuvit Plus 4x8 in 1 x Per Week/30 Days Discharge Instructions: Apply over primary dressing as directed. Com pression Wrap: Unnaboot w/Calamine, 4x10 (in/yd) 1 x Per Week/30 Days Discharge Instructions: Apply Unnaboot at top of leg Com pression Wrap:  CoFlex TLC XL 2-layer Compression System 4x7 (in/yd) 1 x Per Week/30 Days Discharge Instructions: Apply CoFlex 2-layer compression as directed. (alt for 4 layer) WOUND #6: - Lower Leg Wound Laterality: Right, Anterior Cleanser: Soap and Water 1 x Per Week/30 Days Discharge Instructions: May shower and wash wound with dial antibacterial soap and water prior to dressing change. Cleanser: Wound Cleanser 1 x Per Week/30 Days Discharge Instructions: Cleanse the wound with wound cleanser prior to applying a clean dressing using gauze sponges, not tissue or cotton balls. Prim Dressing: KerraCel Ag Gelling Fiber Dressing, 2x2 in (silver alginate) 1 x Per Week/30 Days ary Discharge Instructions: Apply silver alginate to wound bed as instructed Secondary Dressing: Zetuvit Plus 4x8 in 1 x Per Week/30 Days Discharge Instructions: Apply over primary dressing as directed. Com pression Wrap: Unnaboot w/Calamine, 4x10 (in/yd) 1 x Per Week/30 Days Discharge Instructions: Apply Unnaboot at top of the leg Com pression Wrap: CoFlex TLC XL 2-layer Compression System 4x7 (in/yd) 1 x Per Week/30 Days Discharge Instructions: Apply CoFlex 2-layer compression as directed. (alt for 4 layer) 07/17/2021: The right medial lower extremity leg wounds have almost completely closed; one of them just has a tiny opening with a little bit of serous drainage. The left lateral leg wound is really  unchanged. It seems to be stalled. The right medial lower extremity wound did not require debridement. I debrided the left lateral leg wound quite aggressively to try and stimulate the healing cascade. We have been using Hydrofera Blue on that site for a month without any significant improvement; I am going to change to silver alginate on both sites and continue with Coflex compression. Follow-up in 1 week. Electronic Signature(s) Signed: 07/17/2021 11:06:52 AM By: [name] MD FACS Entered By: [name] on 07/17/2021  11:06:52 -------------------------------------------------------------------------------- HxROS Details Patient Name: Date of Service: Erin Gerold [name] 07/17/2021 10:15 A M Medical Record Number: [id number] Patient Account Number: [id number] Date of Birth/Sex: Treating RN: 06-20-1955 (66 y.o. [name] Primary Care Provider: [name] Other Clinician: Referring Provider: Treating Provider/Extender: [name] in Treatment: 20 Information Obtained From Patient Constitutional Symptoms (General Health) Medical History: Past Medical History Notes: morbid obesity Eyes Medical History: Positive for: Cataracts Hematologic/Lymphatic Medical History: Positive for: Lymphedema Cardiovascular Medical History: Positive for: Hypertension; Peripheral Venous Disease Past Medical History Notes: schamberg disease, hyperlipidemia Gastrointestinal Medical History: Past Medical History Notes: diverticulitis , h/o obstruction due to diverticulitis , colostomy and colostomy reversal Endocrine Medical History: Past Medical History Notes: hypothyroidism Integumentary (Skin) Medical History: Negative for: History of Burn Musculoskeletal Medical History: Positive for: Osteoarthritis HBO Extended History Items Eyes: Cataracts Immunizations Pneumococcal Vaccine: Received Pneumococcal Vaccination: Yes Received Pneumococcal Vaccination On or After 60th Birthday: Yes Implantable Devices None Hospitalization / Surgery History Type of Hospitalization/Surgery colostomy reversal 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 Family and Social History Cancer: No; Diabetes: No; Heart Disease: Yes - Mother,Father; Hereditary Spherocytosis: No; Hypertension: Yes - Mother,Father; Kidney Disease: Yes - Mother; Lung Disease: No; Seizures: No; Stroke: No; Thyroid Problems: Yes - Mother; Tuberculosis: No; Never smoker; Marital Status - Single; Alcohol Use: Never; Drug Use:  No History; Caffeine Use: Daily - coffee; Financial Concerns: No; Food, Clothing or Shelter Needs: No; Support System Lacking: No; Transportation Concerns: No Electronic Signature(s) Signed: 07/17/2021 11:15:03 AM By: [name] MD FACS Signed: 07/18/2021 1:29:25 PM By: [name] RN Entered By: [name] on 07/17/2021 11:05:29 -------------------------------------------------------------------------------- SuperBill Details Patient Name: Date of Service: Erin Face D. 07/17/2021 Medical Record Number: [id number] Patient Account Number: [id number] Date of Birth/Sex: Treating RN: 1955-02-24 (66 y.o. [name] Primary Care Provider: [name] Other Clinician: Referring Provider: Treating Provider/Extender: [name] in Treatment: 20 Diagnosis Coding ICD-10 Codes Code Description [phone] Chronic venous hypertension (idiopathic) with ulcer and inflammation of left lower extremity L97.828 Non-pressure chronic ulcer of other part of left lower leg with other specified severity L97.811 Non-pressure chronic ulcer of other part of right lower leg limited to breakdown of skin Facility Procedures CPT4 Code: [id number] Description: 84696 - DEB SUBQ TISSUE 20 SQ CM/< ICD-10 Diagnosis Description L97.828 Non-pressure chronic ulcer of other part of left lower leg with other specified Modifier: severity Quantity: 1 Physician Procedures : CPT4 Code Description Modifier [id number] - WC PHYS LEVEL 3 - EST PT 25 ICD-10 Diagnosis Description L97.828 Non-pressure chronic ulcer of other part of left lower leg with other specified severity L97.811 Non-pressure chronic ulcer of other part of  right lower leg limited to breakdown of skin 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 (idiopathic) with ulcer and inflammation of left lower extremity Quantity: 1 : [id number] 11042 - WC PHYS SUBQ TISS 20 SQ CM ICD-10 Diagnosis Description L97.828 Non-pressure chronic  ulcer of other part of left lower leg with other specified severity Quantity:  1 Electronic Signature(s) Signed: 07/17/2021 11:07:06 AM By: [name] MD FACS Entered By: [name] on 07/17/2021 11:07:06

## 2021-07-24 ENCOUNTER — Encounter (HOSPITAL_BASED_OUTPATIENT_CLINIC_OR_DEPARTMENT_OTHER): Payer: 59 | Admitting: General Surgery

## 2021-07-24 ENCOUNTER — Encounter: Payer: Self-pay | Admitting: Internal Medicine

## 2021-07-24 DIAGNOSIS — B9562 Methicillin resistant Staphylococcus aureus infection as the cause of diseases classified elsewhere: Secondary | ICD-10-CM | POA: Diagnosis not present

## 2021-07-24 DIAGNOSIS — E669 Obesity, unspecified: Secondary | ICD-10-CM | POA: Diagnosis not present

## 2021-07-24 DIAGNOSIS — I872 Venous insufficiency (chronic) (peripheral): Secondary | ICD-10-CM | POA: Diagnosis not present

## 2021-07-24 DIAGNOSIS — L97822 Non-pressure chronic ulcer of other part of left lower leg with fat layer exposed: Secondary | ICD-10-CM | POA: Diagnosis not present

## 2021-07-24 DIAGNOSIS — Z6841 Body Mass Index (BMI) 40.0 and over, adult: Secondary | ICD-10-CM | POA: Diagnosis not present

## 2021-07-24 DIAGNOSIS — I87332 Chronic venous hypertension (idiopathic) with ulcer and inflammation of left lower extremity: Secondary | ICD-10-CM | POA: Diagnosis not present

## 2021-07-24 DIAGNOSIS — L97811 Non-pressure chronic ulcer of other part of right lower leg limited to breakdown of skin: Secondary | ICD-10-CM | POA: Diagnosis not present

## 2021-07-24 DIAGNOSIS — L97828 Non-pressure chronic ulcer of other part of left lower leg with other specified severity: Secondary | ICD-10-CM | POA: Diagnosis not present

## 2021-07-26 ENCOUNTER — Other Ambulatory Visit (HOSPITAL_COMMUNITY): Payer: Self-pay

## 2021-07-26 MED ORDER — DOXYCYCLINE HYCLATE 100 MG PO TABS
100.0000 mg | ORAL_TABLET | Freq: Two times a day (BID) | ORAL | 0 refills | Status: DC
  Filled 2021-07-26: qty 20, 10d supply, fill #0

## 2021-07-26 NOTE — Progress Notes (Signed)
[name], [name] ([id number]) Visit Report for 07/24/2021 Chief Complaint Document Details Patient Name: Date of Service: [name], [name] 07/24/2021 1:15 PM Medical Record Number: [id number] Patient Account Number: [id number] Date of Birth/Sex: Treating RN: Dec 06, 1955 (66 y.o. [name] Primary Care Provider: Glendale [name] Other Clinician: Referring Provider: Treating Provider/Extender: [name] in Treatment: 21 Information Obtained from: Patient Chief Complaint 04/19/2021: The patient is here for ongoing follow-up regarding 2 left lower extremity wounds. Electronic Signature(s) Signed: 07/24/2021 2:41:21 PM By: Erin Maudlin MD FACS Entered By: [name] on 07/24/2021 14:41:21 -------------------------------------------------------------------------------- Debridement Details Patient Name: Date of Service: Erin Gerold [name]. 07/24/2021 1:15 PM Medical Record Number: [id number] Patient Account Number: [id number] Date of Birth/Sex: Treating RN: 09-28-1955 (66 y.o. [name] Primary Care Provider: Glendale [name] Other Clinician: Referring Provider: Treating Provider/Extender: [name] in Treatment: 21 Debridement Performed for Assessment: Wound #3 Left,Lateral Lower Leg Performed By: Physician Erin Maudlin, MD Debridement Type: Debridement Severity of Tissue Pre Debridement: Fat layer exposed Level of Consciousness (Pre-procedure): Awake and Alert Pre-procedure Verification/Time Out Yes - 14:17 Taken: Start Time: 14:17 T Area Debrided (L x W): otal 2.5 (cm) x 2.5 (cm) = 6.25 (cm) Tissue and other material debrided: Non-Viable, Slough, Subcutaneous, Slough Level: Skin/Subcutaneous Tissue Debridement Description: Excisional Instrument: Curette Specimen: Tissue Culture Number of Specimens T aken: 1 Bleeding: Minimum Hemostasis Achieved: Pressure End Time: 14:19 Procedural Pain: 0 Post  Procedural Pain: 0 Response to Treatment: Procedure was tolerated well Level of Consciousness (Post- Awake and Alert procedure): Post Debridement Measurements of Total Wound Length: (cm) 2.5 Width: (cm) 2.5 Depth: (cm) 0.2 Volume: (cm) 0.982 Character of Wound/Ulcer Post Debridement: Improved Severity of Tissue Post Debridement: Fat layer exposed Post Procedure Diagnosis Same as Pre-procedure Electronic Signature(s) Signed: 07/24/2021 3:22:33 PM By: Erin Maudlin MD FACS Signed: 07/26/2021 6:00:02 PM By: Erin Hurst RN, BSN Entered By: [name] on 07/24/2021 14:20:29 -------------------------------------------------------------------------------- HPI Details Patient Name: Date of Service: Erin Gerold [name]. 07/24/2021 1:15 PM Medical Record Number: [id number] Patient Account Number: [id number] Date of Birth/Sex: Treating RN: May 27, 1955 (66 y.o. 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 She tells [name] that she fell on the gravel driveway in July. She developed injuries on her distal lower leg which have not healed. She saw her primary physician on 11/15/2017 who noted her left shin injuries. Gave her antibiotics. At that point the wounds were almost circumferential however most were less than 1.5 cm. Weeping edema fluid was noted. She was referred here for evaluation. The patient has a history of chronic lower extremity edema. 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 She has had prior history with leg weeping fluid. She does not wear compression stockings. She is not doing anything specific  to these wound areas. 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 She also has lymphedema. Put her in 3 layer compression and silver alginate last week. She has  3 small wounds with depth just lateral to the tibia. More problematically than this she has numerous shallow areas some of which are almost canal like in shape with tightly adherent painful debris. It would be very difficult and time- consuming to go through this and attempt to individually debride all these areas.. I changed her to collagen today to see if that would help with any of the surface debris on some of these wounds. Otherwise we will not be able to put this in compression we have to have someone change the dressing. The patient is not eligible for home health 12/25/17 on evaluation today patient actually appears to be doing rather well in regard to the ulcer on her lower extremity. Fortunately there does not appear to be evidence of infection at this time. She has been tolerating the dressing changes without complication. This includes the compression wrap. The only issue she had was that the wrap was initially placed over her bunion region which actually calls her some discomfort and pain. Other than that things seem to be going rather well. 01/01/2018 Seen today for follow-up and management of left lower extremity wound and lymphedema. T oday she presents with a new wound towards to the left lateral LE. Recently treated with a 7 day course of amoxicillin; reason for antibiotic dose is unknown at this time. Tolerating current treatment of collagen with 4- layer wraps. She obtained a venous reflux study on 12/25/17. Studies show on the right abnormal reflux  times of the popliteal vein, great saphenous vein at the saphenofemoral junction at the proximal thigh, great saphenous vein at the mid calf, and origin of the small saphenous vein.No superficial thrombosis. No deep vein thrombosis in the common femoral, femoral,and popliteal veins. Left abnormal reflex times as well of the common femoral vein, popliteal vein, and a great saphenous vein at the saphenofemoral junction, In great saphenous vein at the mid thigh w/o thrombosis. Has any issues or concerns during visit today. Recommended follow-up to vascular specialist due to abnormalities from the venous reflux study. Denies fever, pain, chills, dizziness, nausea, or vomiting. 01/08/18 upon evaluation today the patient actually seems to be showing some signs of improvement in my opinion at this point in regard to the lower extremity ulcerated areas. She still has a lot of drainage but fortunately nothing that appears to be too significant currently. I have been very happy with the overall progress I see today compared to where things were during the last evaluation that I had with her. Nonetheless she has not had her appointment with the vein specialist as of yet in fact we were able to get this approved for her today and confirmed with them she will be seeing them on December 26. Nonetheless in general I do feel like the compression wraps is doing well for her. 01/17/18; quite a bit of improvement since last time I saw this patient she has a small open area remaining on the left lateral calf and even smaller area medially. She has lymphedema chronic stasis changes with distal skin fibrosis. She has an appointment with vascular surgery later this month 01/24/2018; the patient's medial leg has closed. Still a small open area on the left lateral leg. She states that the 4 layer compression we put on last week was too tight and she had to take it off over a few days ago. We have had resultant increase in her  lymphedema in the dorsal foot and a proximal calf. Fortunately that does not  seem to have resulted in any deterioration in her wounds The patient is going to need compression stockings. We have given her measurements to phone elastic therapy in [place]. She has vascular surgery consult on December 26 02/07/18; she's had continuous contraction on the left lateral leg wound which is now very small. Currently using silver alginate under 3 layer compression She saw Dr. Trula Slade of vascular surgery on 02/06/18. It was noted that she had a normal reflux times in the popliteal vein, great saphenous vein at the saphenofemoral junction, great saphenous vein at the proximal thigh great saphenous vein at the mid calf and origin of the small saphenous vein. It was noted that she had significant reflux in the left saphenous veins with diameter measurements in the 0.7-0.8 cm range. It was felt she would benefit from laser ablation to help minimize the risk of ulcer recurrence. It was recommended that she wear 20-30 thigh-high compression stockings and have follow-up in 4-6 weeks 02/17/2018; I thought this lady would be healed however her compression slipped down and she developed increasing swelling and the wound is actually larger. 1/13; we had deterioration last week after the patient's compression slipped down and she developed periwound swelling. I increased her compression before layers we have been using silver alginate we are a lot better again today. She has her compression stockings in waiting 1/23; the patient's wounds are totally healed today. She has her stockings. This was almost circumferential skin damage. She follows up with Dr. Trula Slade of vascular surgery next Monday. READMISSION 02/21/2021 This is a now 65 year old woman that we had in clinic here discharging in January 2020 with wounds on her left calf chronic venous insufficiency. She was discharged with 30/40 stockings. It does not sound like  she has worn stockings in about a year largely from not being able to get them on herself. In November she developed new blisters on her legs an area laterally is opened into a fairly sizable wound. She has weeping posteriorly as well. She has been using Neosporin and Band-Aids. The patient did see Dr. Trula Slade in 2019 and 2020. He felt she might benefit from laser ablation in her left saphenous veins although because of the wound that healed I do not think he went through with it. She might benefit from seeing him again. Her ABI on the left is 1. 1/17; patient's wound on the posterior left calf is closed she has the 2 large areas last week. We put her in 4-layer compression for edema control is a lot better. Our intake nurse noted greenish drainage and odor. We have been using silver alginate 1/25; PCR culture I did have the substantial wound area on the left lateral lower leg showed staff aureus and group A strep. Low titers of coag negative staph which are probably skin contaminants. Resistance detected to tetracycline methicillin and macrolides. I gave her a starter kit of Nuzyra 150 mg x 3 for 2 days then 300 mg for a further 7 days. Marked odor considerable increase in surrounding erythema. We are using Iodoflex last week however I have changed to silver alginate with underlying Bactroban 2/1; she is completing her [place] tomorrow. The degree of erythema around the wounds looks a lot better. Odor has improved. I gave her silver alginate and Bactroban last week and changing her back to Iodoflex to continue with ongoing debridement 2/8; the periwound looks a lot better. Surface of the wound also looks somewhat better although there is still ongoing debridement to be  done we have been using Iodoflex under compression. Primary dressing and silver alginate 2/15; left posterior calf. Some improvement in the surface of the wound but still very gritty we have been using Iodoflex under  compression. 04/05/2021: Left lateral posterior calf. She continues to have significant amounts of drainage, some of which is probably comprised of the Iodoflex microbleeds. There is no odor to the drainage. The satellite lesion on the more posterior aspect of the calf is epithelializing nicely and has contracted quite a bit. There is robust granulation tissue in the dominant wound with minimal adherent slough. 04/12/2021: The satellite lesion has nearly closed. She continues to have good granulation tissue with minimal slough of the larger primary wound. She continues to have a fair amount of drainage, but I think this is less secondary to switching her dressing from Iodoflex to Prisma. 04/19/2021: The satellite lesion is almost completely epithelialized. The larger primary wound continues to contract with good granulation tissue and minimal slough. She is currently in [place] with compression. 04/26/2021: The satellite lesion has closed. The larger primary wound has contracted further and the granulation tissue is robust without being hypertrophic. Minimal slough is present. 05/03/2021: The satellite lesion remains closed. The larger primary wound has a bit of slough, but has also contracted further with good perimeter epithelialization. Good granulation tissue at the wound surface. There was some greenish drainage on the dressing when it was removed; it is not the blue-green typically associated with Pseudomonas aeruginosa, however. 05/10/2021: The primary wound continues to contract. There is minimal slough. The granulation tissue at 9:00 is a bit hypertrophic. No significant drainage. No odor. 05/17/2021: The wound is a little bit smaller today with good granulation tissue. Minimal slough. No significant drainage or odor. 05/24/2021: The wound continues to contract and has a nice base of granulation tissue. Small amount of slough. No concern for infection. 05/31/2021: For some reason, the wound measured  slightly larger today but overall it still appears to be in good condition with a nice base of granulation tissue and minimal slough. 06/07/2021: The wound is smaller today. Good granulation tissue and minimal slough. 06/14/2021: The wound is unchanged in size. She continues to accumulate some slough. Good granulation tissue on the surface. 06/20/2021: The wound is smaller today. Minimal slough with good granulation tissue. 06/27/2021: The wound on her left lateral leg is smaller today with just a bit of slough and eschar accumulation. Unfortunately, she has opened a new superficial wound on her right lower extremity. She has 3+ pitting edema to the knees on that leg and she does not wear compression stockings. 07/04/2021: In addition to the superficial wound on her right lower extremity that she opened up last week, she has opened 3 additional sites on the same leg. Her compression wraps were clearly not in correct position when she came to clinic today as they were at about the mid calf level rather than to the tibial tuberosity. She says that they slipped earlier and she had to come back on Friday to have them redone. The wound on her left lateral leg is perhaps slightly larger with some slough accumulation. 07/11/2021: The superficial wounds on her right lower extremity are nearly closed. They just have a thin layer of eschar overlying them. The wound on her left lateral leg is a little bit shallower and has a bit of slough accumulation. 07/17/2021: The right medial lower extremity leg wounds have almost completely closed; one of them just has a tiny opening with  a little bit of serous drainage. The left lateral leg wound is really unchanged. It seems to be stalled. 07/24/2021: The right leg wounds are completely closed. The left lateral leg wound is actually bigger today. It is a bit more tender. There is a minor accumulation of slough on the surface. Electronic Signature(s) Signed: 07/24/2021 2:42:16 PM  By: Erin Maudlin MD FACS Entered By: [name] on 07/24/2021 14:42:16 -------------------------------------------------------------------------------- Physical Exam Details Patient Name: Date of Service: Erin Gerold [name]. 07/24/2021 1:15 PM Medical Record Number: [id number] Patient Account Number: [id number] Date of Birth/Sex: Treating RN: [birth year] (66 y.o. [name] Primary Care Provider: Glend[place] [name] Other Clinician: Referring Provider: Treating Provider/Extender: [name] in Treatment: 21 Constitutional . . . . No acute distress.Marland Kitchen Respiratory Normal work of breathing on room air.. Notes 07/24/2021: The right leg wounds are completely closed. The left lateral leg wound is actually bigger today. It is a bit more tender. There is a minor accumulation of slough on the surface. Electronic Signature(s) Signed: 07/24/2021 2:42:43 PM By: Erin Maudlin MD FACS Entered By: [name] on 07/24/2021 14:42:43 -------------------------------------------------------------------------------- Physician Orders Details Patient Name: Date of Service: Erin Gerold [name]. 07/24/2021 1:15 PM Medical Record Number: [id number] Patient Account Number: [id number] Date of Birth/Sex: Treating RN: 02-17-1955 (66 y.o. [name] Primary Care Provider: Glendale [name] Other Clinician: Referring Provider: Treating Provider/Extender: [name] in Treatment: 21 Verbal / Phone Orders: No Diagnosis Coding ICD-10 Coding Code Description [phone] Chronic venous hypertension (idiopathic) with ulcer and inflammation of left lower extremity L97.828 Non-pressure chronic ulcer of other part of left lower leg with other specified severity L97.811 Non-pressure chronic ulcer of other part of right lower leg limited to breakdown of skin Follow-up Appointments ppointment in 1 week. - Dr. Celine Ahr - Room 3 - Monday 6/19 @  3:15pm Return A Bathing/ Shower/ Hygiene May shower with protection but do not get wound dressing(s) wet. - Ok to use [employer], can purchase at [facility], [facility], or [employer] Edema Control - Lymphedema / SCD / Other Elevate legs to the level of the heart or above for 30 minutes daily and/or when sitting, a frequency of: - throughout the day Avoid standing for long periods of time. Exercise regularly Compression stocking or Garment 20-30 mm/Hg pressure to: - Brochure for Elastic Therapy. Leg measurements included Wound Treatment Wound #3 - Lower Leg Wound Laterality: Left, Lateral Cleanser: Soap and Water 1 x Per Week/30 Days Discharge Instructions: May shower and wash wound with dial antibacterial soap and water prior to dressing change. Cleanser: Wound Cleanser 1 x Per Week/30 Days Discharge Instructions: Cleanse the wound with wound cleanser prior to applying a clean dressing using gauze sponges, not tissue or cotton balls. Peri-Wound Care: Zinc Oxide Ointment 30g tube 1 x Per Week/30 Days Discharge Instructions: Apply Zinc Oxide to periwound with each dressing change Peri-Wound Care: Sween Lotion (Moisturizing lotion) 1 x Per Week/30 Days Discharge Instructions: Apply moisturizing lotion as directed Topical: Mupirocin Ointment 1 x Per Week/30 Days Discharge Instructions: Apply Mupirocin (Bactroban) as instructed Prim Dressing: KerraCel Ag Gelling Fiber Dressing, 4x5 in (silver alginate) 1 x Per Week/30 Days ary Discharge Instructions: Apply silver alginate to wound bed as instructed Secondary Dressing: Zetuvit Plus 4x8 in 1 x Per Week/30 Days Discharge Instructions: Apply over primary dressing as directed. Compression Wrap: Unnaboot w/Calamine, 4x10 (in/yd) 1 x Per Week/30 Days Discharge Instructions: Apply Unnaboot at top of leg Compression Wrap: CoFlex TLC XL 2-layer  Compression System 4x7 (in/yd) 1 x Per Week/30 Days Discharge Instructions: Apply CoFlex 2-layer compression as  directed. (alt for 4 layer) Laboratory naerobe culture (MICRO) - Left lateral lower leg - (ICD10 L97.828 - Non-pressure chronic ulcer of Bacteria identified in Unspecified specimen by A other part of left lower leg with other specified severity) LOINC Code: 353-2 Convenience Name: Anaerobic culture Electronic Signature(s) Signed: 07/24/2021 2:43:21 PM By: Erin Maudlin MD FACS Entered By: [name] on 07/24/2021 14:43:18 -------------------------------------------------------------------------------- Problem List Details Patient Name: Date of Service: Erin Gerold [name]. 07/24/2021 1:15 PM Medical Record Number: [id number] Patient Account Number: [id number] Date of Birth/Sex: Treating RN: 26-Jul-1955 (66 y.o. [name] Primary Care Provider: Glendale [name] Other Clinician: Referring Provider: Treating Provider/Extender: [name] in Treatment: 21 Active Problems ICD-10 Encounter Code Description Active Date MDM Diagnosis 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 (idiopathic) with ulcer and inflammation of left 02/21/2021 No Yes lower extremity L97.828 Non-pressure chronic ulcer of other part of left lower leg with other specified 02/21/2021 No Yes severity Inactive Problems ICD-10 Code Description Active Date Inactive Date L03.116 Cellulitis of left lower limb 03/08/2021 03/08/2021 L97.811 Non-pressure chronic ulcer of other part of right lower leg limited to breakdown of skin 06/27/2021 06/27/2021 Resolved Problems Electronic Signature(s) Signed: 07/24/2021 2:23:33 PM By: Erin Maudlin MD FACS Entered By: [name] on 07/24/2021 14:23:32 -------------------------------------------------------------------------------- Progress Note Details Patient Name: Date of Service: Erin Gerold [name]. 07/24/2021 1:15 PM Medical Record Number: [id number] Patient Account Number: [id number] Date of Birth/Sex: Treating RN: 1955-03-10 (66 y.o. [name] Primary Care Provider: Glendale [name] Other Clinician: Referring Provider: Treating Provider/Extender: [name] in Treatment: 21 Subjective Chief Complaint Information obtained from Patient 04/19/2021: The patient is here for ongoing follow-up regarding 2 left lower extremity wounds. 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 She tells [name] that she fell on the gravel driveway in July. She developed injuries on her distal lower leg which have not healed. She saw her primary physician on 11/15/2017 who noted her left shin injuries. Gave her antibiotics. At that point the wounds were almost circumferential however most were less than 1.5 cm. Weeping edema fluid was noted. She was referred here for evaluation. The patient has a history of chronic lower extremity edema. 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 She has had prior history with leg weeping fluid. She does not wear compression stockings. She is not doing anything specific to these wound areas. 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 She also has lymphedema. Put her in 3 layer compression and silver alginate last week. She has 3 small wounds with depth just lateral to the tibia. More problematically than this she has numerous shallow areas some of which are almost canal like in shape with tightly adherent painful debris. It would be very difficult and time- consuming to go through this and attempt to individually debride  all these areas.. I changed her to collagen today to see if that would help with any of the surface debris on some of these wounds. Otherwise  we will not be able to put this in compression we have to have someone change the dressing. The patient is not eligible for home health 12/25/17 on evaluation today patient actually appears to be doing rather well in regard to the ulcer on her lower extremity. Fortunately there does not appear to be evidence of infection at this time. She has been tolerating the dressing changes without complication. This includes the compression wrap. The only issue she had was that the wrap was initially placed over her bunion region which actually calls her some discomfort and pain. Other than that things seem to be going rather well. 01/01/2018 Seen today for follow-up and management of left lower extremity wound and lymphedema. T oday she presents with a new wound towards to the left lateral LE. Recently treated with a 7 day course of amoxicillin; reason for antibiotic dose is unknown at this time. Tolerating current treatment of collagen with 4- layer wraps. She obtained a venous reflux study on 12/25/17. Studies show on the right abnormal reflux times of the popliteal vein, great saphenous vein at the saphenofemoral junction at the proximal thigh, great saphenous vein at the mid calf, and origin of the small saphenous vein.No superficial thrombosis. No deep vein thrombosis in the common femoral, femoral,and popliteal veins. Left abnormal reflex times as well of the common femoral vein, popliteal vein, and a great saphenous vein at the saphenofemoral junction, In great saphenous vein at the mid thigh w/o thrombosis. Has any issues or concerns during visit today. Recommended follow-up to vascular specialist due to abnormalities from the venous reflux study. Denies fever, pain, chills, dizziness, nausea, or vomiting. 01/08/18 upon evaluation today the patient actually seems  to be showing some signs of improvement in my opinion at this point in regard to the lower extremity ulcerated areas. She still has a lot of drainage but fortunately nothing that appears to be too significant currently. I have been very happy with the overall progress I see today compared to where things were during the last evaluation that I had with her. Nonetheless she has not had her appointment with the vein specialist as of yet in fact we were able to get this approved for her today and confirmed with them she will be seeing them on December 26. Nonetheless in general I do feel like the compression wraps is doing well for her. 01/17/18; quite a bit of improvement since last time I saw this patient she has a small open area remaining on the left lateral calf and even smaller area medially. She has lymphedema chronic stasis changes with distal skin fibrosis. She has an appointment with vascular surgery later this month 01/24/2018; the patient's medial leg has closed. Still a small open area on the left lateral leg. She states that the 4 layer compression we put on last week was too tight and she had to take it off over a few days ago. We have had resultant increase in her lymphedema in the dorsal foot and a proximal calf. Fortunately that does not seem to have resulted in any deterioration in her wounds The patient is going to need compression stockings. We have given her measurements to phone elastic therapy in [place]. She has vascular surgery consult on December 26 02/07/18; she's had continuous contraction on the left lateral leg wound which is now very small. Currently using silver alginate under 3 layer compression She saw Dr. Trula Slade of vascular surgery on 02/06/18. It was noted that she had a normal  reflux times in the popliteal vein, great saphenous vein at the saphenofemoral junction, great saphenous vein at the proximal thigh great saphenous vein at the mid calf and origin of the small  saphenous vein. It was noted that she had significant reflux in the left saphenous veins with diameter measurements in the 0.7-0.8 cm range. It was felt she would benefit from laser ablation to help minimize the risk of ulcer recurrence. It was recommended that she wear 20-30 thigh-high compression stockings and have follow-up in 4-6 weeks 02/17/2018; I thought this lady would be healed however her compression slipped down and she developed increasing swelling and the wound is actually larger. 1/13; we had deterioration last week after the patient's compression slipped down and she developed periwound swelling. I increased her compression before layers we have been using silver alginate we are a lot better again today. She has her compression stockings in waiting 1/23; the patient's wounds are totally healed today. She has her stockings. This was almost circumferential skin damage. She follows up with Dr. Trula Slade of vascular surgery next Monday. READMISSION 02/21/2021 This is a now 66 year old woman that we had in clinic here discharging in January 2020 with wounds on her left calf chronic venous insufficiency. She was discharged with 30/40 stockings. It does not sound like she has worn stockings in about a year largely from not being able to get them on herself. In November she developed new blisters on her legs an area laterally is opened into a fairly sizable wound. She has weeping posteriorly as well. She has been using Neosporin and Band-Aids. The patient did see Dr. Trula Slade in 2019 and 2020. He felt she might benefit from laser ablation in her left saphenous veins although because of the wound that healed I do not think he went through with it. She might benefit from seeing him again. Her ABI on the left is 1. 1/17; patient's wound on the posterior left calf is closed she has the 2 large areas last week. We put her in 4-layer compression for edema control is a lot better. Our intake nurse noted  greenish drainage and odor. We have been using silver alginate 1/25; PCR culture I did have the substantial wound area on the left lateral lower leg showed staff aureus and group A strep. Low titers of coag negative staph which are probably skin contaminants. Resistance detected to tetracycline methicillin and macrolides. I gave her a starter kit of Nuzyra 150 mg x 3 for 2 days then 300 mg for a further 7 days. Marked odor considerable increase in surrounding erythema. We are using Iodoflex last week however I have changed to silver alginate with underlying Bactroban 2/1; she is completing her [place] tomorrow. The degree of erythema around the wounds looks a lot better. Odor has improved. I gave her silver alginate and Bactroban last week and changing her back to Iodoflex to continue with ongoing debridement 2/8; the periwound looks a lot better. Surface of the wound also looks somewhat better although there is still ongoing debridement to be done we have been using Iodoflex under compression. Primary dressing and silver alginate 2/15; left posterior calf. Some improvement in the surface of the wound but still very gritty we have been using Iodoflex under compression. 04/05/2021: Left lateral posterior calf. She continues to have significant amounts of drainage, some of which is probably comprised of the Iodoflex microbleeds. There is no odor to the drainage. The satellite lesion on the more posterior aspect of the calf  is epithelializing nicely and has contracted quite a bit. There is robust granulation tissue in the dominant wound with minimal adherent slough. 04/12/2021: The satellite lesion has nearly closed. She continues to have good granulation tissue with minimal slough of the larger primary wound. She continues to have a fair amount of drainage, but I think this is less secondary to switching her dressing from Iodoflex to Prisma. 04/19/2021: The satellite lesion is almost completely  epithelialized. The larger primary wound continues to contract with good granulation tissue and minimal slough. She is currently in [place] with compression. 04/26/2021: The satellite lesion has closed. The larger primary wound has contracted further and the granulation tissue is robust without being hypertrophic. Minimal slough is present. 05/03/2021: The satellite lesion remains closed. The larger primary wound has a bit of slough, but has also contracted further with good perimeter epithelialization. Good granulation tissue at the wound surface. There was some greenish drainage on the dressing when it was removed; it is not the blue-green typically associated with Pseudomonas aeruginosa, however. 05/10/2021: The primary wound continues to contract. There is minimal slough. The granulation tissue at 9:00 is a bit hypertrophic. No significant drainage. No odor. 05/17/2021: The wound is a little bit smaller today with good granulation tissue. Minimal slough. No significant drainage or odor. 05/24/2021: The wound continues to contract and has a nice base of granulation tissue. Small amount of slough. No concern for infection. 05/31/2021: For some reason, the wound measured slightly larger today but overall it still appears to be in good condition with a nice base of granulation tissue and minimal slough. 06/07/2021: The wound is smaller today. Good granulation tissue and minimal slough. 06/14/2021: The wound is unchanged in size. She continues to accumulate some slough. Good granulation tissue on the surface. 06/20/2021: The wound is smaller today. Minimal slough with good granulation tissue. 06/27/2021: The wound on her left lateral leg is smaller today with just a bit of slough and eschar accumulation. Unfortunately, she has opened a new superficial wound on her right lower extremity. She has 3+ pitting edema to the knees on that leg and she does not wear compression stockings. 07/04/2021: In addition to the  superficial wound on her right lower extremity that she opened up last week, she has opened 3 additional sites on the same leg. Her compression wraps were clearly not in correct position when she came to clinic today as they were at about the mid calf level rather than to the tibial tuberosity. She says that they slipped earlier and she had to come back on Friday to have them redone. The wound on her left lateral leg is perhaps slightly larger with some slough accumulation. 07/11/2021: The superficial wounds on her right lower extremity are nearly closed. They just have a thin layer of eschar overlying them. The wound on her left lateral leg is a little bit shallower and has a bit of slough accumulation. 07/17/2021: The right medial lower extremity leg wounds have almost completely closed; one of them just has a tiny opening with a little bit of serous drainage. The left lateral leg wound is really unchanged. It seems to be stalled. 07/24/2021: The right leg wounds are completely closed. The left lateral leg wound is actually bigger today. It is a bit more tender. There is a minor accumulation of slough on the surface. Patient History Information obtained from Patient. Family History Heart Disease - Mother,Father, Hypertension - Mother,Father, Kidney Disease - Mother, Thyroid Problems - Mother, No family  history of Cancer, Diabetes, Hereditary Spherocytosis, Lung Disease, Seizures, Stroke, Tuberculosis. Social History Never smoker, Marital Status - Single, Alcohol Use - Never, Drug Use - No History, Caffeine Use - Daily - coffee. Medical History Eyes Patient has history of Cataracts Hematologic/Lymphatic Patient has history of Lymphedema Cardiovascular Patient has history of Hypertension, Peripheral Venous Disease Integumentary (Skin) Denies history of History of Burn Musculoskeletal Patient has history of Osteoarthritis Hospitalization/Surgery History - colostomy reversal. - colostomy due to  diverticulitis. Medical A Surgical History Notes nd Constitutional Symptoms (General Health) morbid obesity Cardiovascular schamberg disease, hyperlipidemia Gastrointestinal diverticulitis , h/o obstruction due to diverticulitis , colostomy and colostomy reversal Endocrine hypothyroidism Objective Constitutional No acute distress.. Vitals Time Taken: 1:43 PM, Height: 62 in, Weight: 226 lbs, BMI: 41.3, Temperature: 98.0 F, Pulse: 80 bpm, Respiratory Rate: 18 breaths/min, Blood Pressure: 116/75 mmHg. Respiratory Normal work of breathing on room air.. General Notes: 07/24/2021: The right leg wounds are completely closed. The left lateral leg wound is actually bigger today. It is a bit more tender. There is a minor accumulation of slough on the surface. Integumentary (Hair, Skin) Wound #3 status is Open. Original cause of wound was Blister. The date acquired was: 12/13/2020. The wound has been in treatment 21 weeks. The wound is located on the Left,Lateral Lower Leg. The wound measures 2.5cm length x 2.5cm width x 0.2cm depth; 4.909cm^2 area and 0.982cm^3 volume. There is Fat Layer (Subcutaneous Tissue) exposed. There is no tunneling or undermining noted. There is a medium amount of serosanguineous drainage noted. The wound margin is distinct with the outline attached to the wound base. There is large (67-100%) red, pink granulation within the wound bed. There is a small (1-33%) amount of necrotic tissue within the wound bed including Adherent Slough. Wound #6 status is Healed - Epithelialized. Original cause of wound was Gradually Appeared. The date acquired was: 06/20/2021. The wound has been in treatment 3 weeks. The wound is located on the Right,Anterior Lower Leg. The wound measures 0cm length x 0cm width x 0cm depth; 0cm^2 area and 0cm^3 volume. There is no tunneling or undermining noted. There is a none present amount of drainage noted. The wound margin is flat and intact. There is  no granulation within the wound bed. There is no necrotic tissue within the wound bed. 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 There was a Excisional Skin/Subcutaneous Tissue Debridement with a total area of 6.25 sq cm performed by Erin Maudlin, MD. With the following instrument(s): Curette to remove Non-Viable tissue/material. Material removed includes Subcutaneous Tissue and Slough and. 1 specimen was taken by a Tissue Culture and sent to the lab per facility protocol. A time out was conducted at 14:17, prior to the start of the procedure. A Minimum amount of bleeding was controlled with Pressure. The procedure was tolerated well with a pain level of 0 throughout and a pain level of 0 following the procedure. Post Debridement Measurements: 2.5cm length x 2.5cm width x 0.2cm depth; 0.982cm^3 volume. Character of Wound/Ulcer Post Debridement is improved. Severity of Tissue Post Debridement is: Fat layer exposed. Post procedure Diagnosis Wound #3: Same as Pre-Procedure Pre-procedure diagnosis of Wound #3 is a Venous Leg Ulcer located on  the Left,Lateral Lower Leg . There was a Double Layer Compression Therapy Procedure by Erin Hurst, RN. Post procedure Diagnosis Wound #3: Same as Pre-Procedure Plan Follow-up Appointments: Return Appointment in 1 week. - Dr. Celine Ahr - Room 3 - Monday 6/19 @ 3:15pm Bathing/ Shower/ Hygiene: May shower with protection but do not get wound dressing(s) wet. - Ok to use [employer], can purchase at [facility], [facility], or [employer] Edema Control - Lymphedema / SCD / Other: Elevate legs to the level of the heart or  above for 30 minutes daily and/or when sitting, a frequency of: - throughout the day Avoid standing for long periods of time. Exercise regularly Compression stocking or Garment 20-30 mm/Hg pressure to: - Brochure for Elastic Therapy. Leg measurements included Laboratory ordered were: Anaerobic culture - Left lateral lower leg WOUND #3: - Lower Leg Wound Laterality: Left, Lateral Cleanser: Soap and Water 1 x Per Week/30 Days Discharge Instructions: May shower and wash wound with dial antibacterial soap and water prior to dressing change. Cleanser: Wound Cleanser 1 x Per Week/30 Days Discharge Instructions: Cleanse the wound with wound cleanser prior to applying a clean dressing using gauze sponges, not tissue or cotton balls. Peri-Wound Care: Zinc Oxide Ointment 30g tube 1 x Per Week/30 Days Discharge Instructions: Apply Zinc Oxide to periwound with each dressing change Peri-Wound Care: Sween Lotion (Moisturizing lotion) 1 x Per Week/30 Days Discharge Instructions: Apply moisturizing lotion as directed Topical: Mupirocin Ointment 1 x Per Week/30 Days Discharge Instructions: Apply Mupirocin (Bactroban) as instructed Prim Dressing: KerraCel Ag Gelling Fiber Dressing, 4x5 in (silver alginate) 1 x Per Week/30 Days ary Discharge Instructions: Apply silver alginate to wound bed as instructed Secondary Dressing: Zetuvit Plus 4x8 in 1 x Per Week/30 Days Discharge Instructions: Apply over primary dressing as directed. Com pression Wrap: Unnaboot w/Calamine, 4x10 (in/yd) 1 x Per Week/30 Days Discharge Instructions: Apply Unnaboot at top of leg Com pression Wrap: CoFlex TLC XL 2-layer Compression System 4x7 (in/yd) 1 x Per Week/30 Days Discharge Instructions: Apply CoFlex 2-layer compression as directed. (alt for 4 layer) 07/24/2021: The right leg wounds are completely closed. The left lateral leg wound is actually bigger today. It is a bit more tender. There is a minor accumulation of slough on the  surface. I used a curette to debride the wound of its slough. Due to the enlargement and increase in tenderness, I also took a PCR culture. For today, we will apply topical mupirocin underneath the silver alginate and compression wraps. Once the PCR culture data are available, we will adjust our treatment as indicated. Follow-up in 1 week. Electronic Signature(s) Signed: 07/24/2021 2:44:14 PM By: Erin Maudlin MD FACS Entered By: [name] on 07/24/2021 14:44:14 -------------------------------------------------------------------------------- HxROS Details Patient Name: Date of Service: Erin Gerold [name]. 07/24/2021 1:15 PM Medical Record Number: [id number] Patient Account Number: [id number] Date of Birth/Sex: Treating RN: 1955-05-03 (66 y.o. [name] Primary Care Provider: [place] [name] Other Clinician: Referring Provider: Treating Provider/Extender: [name] in Treatment: 21 Information Obtained From Patient Constitutional Symptoms (General Health) Medical History: Past Medical History Notes: morbid obesity Eyes Medical History: Positive for: Cataracts Hematologic/Lymphatic Medical History: Positive for: Lymphedema Cardiovascular Medical History: Positive for: Hypertension; Peripheral Venous Disease Past Medical History Notes: schamberg disease, hyperlipidemia Gastrointestinal Medical History: Past Medical History Notes: diverticulitis , h/o obstruction due to diverticulitis , colostomy and colostomy reversal Endocrine Medical History: Past Medical History Notes: hypothyroidism Integumentary (Skin) Medical History: Negative for: History of Burn Musculoskeletal Medical History: Positive  for: Osteoarthritis HBO Extended History Items Eyes: Cataracts Immunizations Pneumococcal Vaccine: Received Pneumococcal Vaccination: Yes Received Pneumococcal Vaccination On or After 60th Birthday: Yes Implantable  Devices None Hospitalization / Surgery History Type of Hospitalization/Surgery colostomy reversal 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 Family and Social History Cancer: No; Diabetes: No; Heart Disease: Yes - Mother,Father; Hereditary Spherocytosis: No; Hypertension: Yes - Mother,Father; Kidney Disease: Yes - Mother; Lung Disease: No; Seizures: No; Stroke: No; Thyroid Problems: Yes - Mother; Tuberculosis: No; Never smoker; Marital Status - Single; Alcohol Use: Never; Drug Use: No History; Caffeine Use: Daily - coffee; Financial Concerns: No; Food, Clothing or Shelter Needs: No; Support System Lacking: No; Transportation Concerns: No Electronic Signature(s) Signed: 07/24/2021 3:22:33 PM By: Erin Maudlin MD FACS Signed: 07/25/2021 5:11:17 PM By: [name] RN Entered By: [name] on 07/24/2021 14:42:21 -------------------------------------------------------------------------------- SuperBill Details Patient Name: Date of Service: [name] 07/24/2021 Medical Record Number: [id number] Patient Account Number: [id number] Date of Birth/Sex: Treating RN: 02-18-1955 (65 y.o. [name] Primary Care Provider: [place] [name] Other Clinician: Referring Provider: Treating Provider/Extender: [name] in Treatment: 21 Diagnosis Coding ICD-10 Codes Code Description [phone] Chronic venous hypertension (idiopathic) with ulcer and inflammation of left lower extremity L97.828 Non-pressure chronic ulcer of other part of left lower leg with other specified severity Facility Procedures CPT4 Code: [id number] Description: 84720 - DEB SUBQ TISSUE 20 SQ CM/< ICD-10 Diagnosis Description L97.828 Non-pressure chronic ulcer of other part of left lower leg with other specified Modifier: severity Quantity: 1 Physician Procedures : CPT4 Code Description Modifier [id number] - WC PHYS LEVEL 3 - EST PT ICD-10 Diagnosis Description I87.332 Chronic venous  hypertension (idiopathic) with ulcer and inflammation of left lower extremity L97.828 Non-pressure chronic ulcer of other part  of left lower leg with other specified severity Quantity: 1 : [id number] - WC PHYS SUBQ TISS 20 SQ CM ICD-10 Diagnosis Description L97.828 Non-pressure chronic ulcer of other part of left lower leg with other specified severity Quantity: 1 Electronic Signature(s) Signed: 07/24/2021 2:44:30 PM By: Erin Maudlin MD FACS Entered By: [name] on 07/24/2021 14:44:30

## 2021-07-26 NOTE — Progress Notes (Signed)
Erin, [name] ([id number]) Visit Report for 07/24/2021 Arrival Information Details Patient Name: Date of Service: [name], [name] 07/24/2021 1:15 PM Medical Record Number: [id number] Patient Account Number: [id number] Date of Birth/Sex: Treating RN: [birth year] (66 y.o. [name] Primary Care Lavere Shinsky: [name] Other Clinician: Referring Jennilee Demarco: Treating [name]/Extender: [name] in Treatment: 21 Visit Information History Since Last Visit Added or deleted any medications: No Patient Arrived: Cane Any new allergies or adverse reactions: No Arrival Time: 13:40 Had a fall or experienced change in No Accompanied By: alone activities of daily living that may affect Transfer Assistance: None risk of falls: Patient Identification Verified: Yes Signs or symptoms of abuse/neglect since last visito No Secondary Verification Process Completed: Yes Hospitalized since last visit: No Patient Requires Transmission-Based Precautions: No Implantable device outside of the clinic excluding No Patient Has Alerts: No cellular tissue based products placed in the center since last visit: Has Dressing in Place as Prescribed: Yes Has Compression in Place as Prescribed: Yes Pain Present Now: No Electronic Signature(s) Signed: 07/26/2021 6:00:02 PM By: [name] RN, BSN Entered By: [name] on 07/24/2021 13:43:49 -------------------------------------------------------------------------------- Compression Therapy Details Patient Name: Date of Service: Erin Gerold [name] 07/24/2021 1:15 PM Medical Record Number: [id number] Patient Account Number: [id number] Date of Birth/Sex: Treating RN: 1955/12/11 (66 y.o. [name] Primary Care Cathlin Buchan: [name] Other Clinician: Referring Juliannah Ohmann: Treating Imagine Nest/Extender: [name] in Treatment: 21 Compression Therapy Performed for Wound Assessment: Wound  #3 Left,Lateral Lower Leg Performed By: Clinician [name], RN Compression Type: Double Layer Post Procedure Diagnosis Same as Pre-procedure Electronic Signature(s) Signed: 07/26/2021 6:00:02 PM By: [name] RN, BSN Entered By: [name] on 07/24/2021 14:20:53 -------------------------------------------------------------------------------- Encounter Discharge Information Details Patient Name: Date of Service: Erin Gerold [name] 07/24/2021 1:15 PM Medical Record Number: [id number] Patient Account Number: [id number] Date of Birth/Sex: Treating RN: November 20, 1955 (66 y.o. [name] Primary Care Olubunmi Rothenberger: [name] Other Clinician: Referring Addiel Mccardle: Treating [name]/Extender: [name] in Treatment: 21 Encounter Discharge Information Items Post Procedure Vitals Discharge Condition: Stable Temperature (F): 98.0 Ambulatory Status: Cane Pulse (bpm): 80 Discharge Destination: Home Respiratory Rate (breaths/min): 18 Transportation: Private Auto Blood Pressure (mmHg): 116/75 Accompanied By: alone Schedule Follow-up Appointment: Yes Clinical Summary of Care: Patient Declined Electronic Signature(s) Signed: 07/26/2021 6:00:02 PM By: [name] RN, BSN Entered By: [name] on 07/25/2021 11:20:04 -------------------------------------------------------------------------------- Lower Extremity Assessment Details Patient Name: Date of Service: Erin Gerold [name] 07/24/2021 1:15 PM Medical Record Number: [id number] Patient Account Number: [id number] Date of Birth/Sex: Treating RN: 1955-02-27 (66 y.o. [name] Primary Care Anni Hocevar: [name] Other Clinician: Referring Jazen Spraggins: Treating Mahamadou Weltz/Extender: [name] in Treatment: 21 Edema Assessment Assessed: [Left: No] Patrice Paradise: No] Edema: [Left: Yes] [Right: Yes] Calf Left: Right: Point of Measurement: 29 cm From Medial Instep  40 cm 42 cm Ankle Left: Right: Point of Measurement: 10 cm From Medial Instep 24.5 cm 26 cm Vascular Assessment Pulses: Dorsalis Pedis Palpable: [Left:Yes] [Right:Yes] Electronic Signature(s) Signed: 07/26/2021 6:00:02 PM By: [name] RN, BSN Entered By: [name] on 07/24/2021 13:59:21 -------------------------------------------------------------------------------- Multi Wound Chart Details Patient Name: Date of Service: Erin Gerold [name] 07/24/2021 1:15 PM Medical Record Number: [id number] Patient Account Number: [id number] Date of Birth/Sex: Treating RN: 1955/04/20 (66 y.o. [name] Primary Care Shahiem Bedwell: [name] Other Clinician: Referring Dyshawn Cangelosi: Treating Keamber Macfadden/Extender: [name] in Treatment: 21 Vital Signs Height(in): 62 Pulse(bpm):  80 Weight(lbs): 226 Blood Pressure(mmHg): 116/75 Body Mass Index(BMI): 41.3 Temperature(F): 98.0 Respiratory Rate(breaths/min): 18 Photos: [N/A:N/A] Left, Lateral Lower Leg Right, Anterior Lower Leg N/A Wound Location: Blister Gradually Appeared N/A Wounding Event: Venous Leg Ulcer Venous Leg Ulcer N/A Primary Etiology: Cataracts, Lymphedema, Cataracts, Lymphedema, N/A Comorbid History: Hypertension, Peripheral Venous Hypertension, Peripheral Venous Disease, Osteoarthritis Disease, Osteoarthritis 12/13/2020 06/20/2021 N/A Date Acquired: 21 3 N/A Weeks of Treatment: Open Healed - Epithelialized N/A Wound Status: No No N/A Wound Recurrence: No Yes N/A Clustered Wound: 2.5x2.5x0.2 0x0x0 N/A Measurements L x W x D (cm) 4.909 0 N/A A (cm) : rea 0.982 0 N/A Volume (cm) : 83.10% 100.00% N/A % Reduction in A rea: 83.10% 100.00% N/A % Reduction in Volume: Full Thickness Without Exposed Full Thickness Without Exposed N/A Classification: Support Structures Support Structures Medium None Present N/A Exudate A mount: Serosanguineous N/A N/A Exudate Type: red,  [name] N/A N/A Exudate Color: Distinct, outline attached Flat and Intact N/A Wound Margin: Large (67-100%) None Present (0%) N/A Granulation A mount: Red, Pink N/A N/A Granulation Quality: Small (1-33%) None Present (0%) N/A Necrotic A mount: Fat Layer (Subcutaneous Tissue): Yes Fascia: No N/A Exposed Structures: Fascia: No Fat Layer (Subcutaneous Tissue): No Tendon: No Tendon: No Muscle: No Muscle: No Joint: No Joint: No Bone: No Bone: No Medium (34-66%) Large (67-100%) N/A Epithelialization: Debridement - Excisional N/A N/A Debridement: Pre-procedure Verification/Time Out 14:17 N/A N/A Taken: Subcutaneous, Slough N/A N/A Tissue Debrided: Skin/Subcutaneous Tissue N/A N/A Level: 6.25 N/A N/A Debridement A (sq cm): rea Curette N/A N/A Instrument: Minimum N/A N/A Bleeding: Pressure N/A N/A Hemostasis A chieved: 0 N/A N/A Procedural Pain: 0 N/A N/A Post Procedural Pain: Procedure was tolerated well N/A N/A Debridement Treatment Response: 2.5x2.5x0.2 N/A N/A Post Debridement Measurements L x W x D (cm) 0.982 N/A N/A Post Debridement Volume: (cm) Compression Therapy N/A N/A Procedures Performed: Debridement Treatment Notes Electronic Signature(s) Signed: 07/24/2021 2:23:38 PM By: [name] MD FACS Signed: 07/25/2021 5:11:17 PM By: [name] RN Entered By: [name] on 07/24/2021 14:23:37 -------------------------------------------------------------------------------- Multi-Disciplinary Care Plan Details Patient Name: Date of Service: Erin Gerold [name] 07/24/2021 1:15 PM Medical Record Number: [id number] Patient Account Number: [id number] Date of Birth/Sex: Treating RN: 1955/11/20 (66 y.o. [name] Primary Care Lace Chenevert: [name] Other Clinician: Referring Siddhanth Denk: Treating Caspian Deleonardis/Extender: [name] in Treatment: 21 Multidisciplinary Care Plan reviewed with physician Active  Inactive Venous Leg Ulcer Nursing Diagnoses: Actual venous Insuffiency (use after diagnosis is confirmed) Goals: Patient will maintain optimal edema control Date Initiated: 02/21/2021 Target Resolution Date: 09/08/2021 Goal Status: Active Patient/caregiver will verbalize understanding of disease process and disease management Date Initiated: 02/21/2021 Date Inactivated: 03/29/2021 Target Resolution Date: 03/24/2021 Goal Status: Met Interventions: Assess peripheral edema status every visit. Compression as ordered Provide education on venous insufficiency Notes: Wound/Skin Impairment Nursing Diagnoses: Impaired tissue integrity Knowledge deficit related to ulceration/compromised skin integrity Goals: Patient/caregiver will verbalize understanding of skin care regimen Date Initiated: 02/21/2021 Target Resolution Date: 09/01/2021 Goal Status: Active Ulcer/skin breakdown will have a volume reduction of 30% by week 4 Date Initiated: 02/21/2021 Date Inactivated: 03/29/2021 Target Resolution Date: 03/24/2021 Goal Status: Unmet Unmet Reason: infection Ulcer/skin breakdown will have a volume reduction of 50% by week 8 Date Initiated: 03/29/2021 Date Inactivated: 04/19/2021 Target Resolution Date: 04/21/2021 Goal Status: Met Interventions: Assess patient/caregiver ability to obtain necessary supplies Assess patient/caregiver ability to perform ulcer/skin care regimen upon admission and as needed Assess ulceration(s) every visit Provide education on ulcer and  skin care Notes: Electronic Signature(s) Signed: 07/26/2021 6:00:02 PM By: [name] RN, BSN Entered By: [name] on 07/24/2021 14:16:27 -------------------------------------------------------------------------------- Pain Assessment Details Patient Name: Date of Service: Erin Gerold [name] 07/24/2021 1:15 PM Medical Record Number: [id number] Patient Account Number: [id number] Date of Birth/Sex: Treating RN: Nov 09, 1955 (66  y.o. [name] Primary Care Ayerim Berquist: [name] Other Clinician: Referring Macrina Lehnert: Treating Tayia Stonesifer/Extender: [name] in Treatment: 21 Active Problems Location of Pain Severity and Description of Pain Patient Has Paino No Site Locations Pain Management and Medication Current Pain Management: Electronic Signature(s) Signed: 07/26/2021 6:00:02 PM By: [name] RN, BSN Entered By: [name] on 07/24/2021 13:44:26 -------------------------------------------------------------------------------- Patient/Caregiver Education Details Patient Name: Date of Service: [name] 6/12/2023andnbsp1:15 PM Medical Record Number: [id number] Patient Account Number: [id number] Date of Birth/Gender: Treating RN: 27-Apr-1955 (66 y.o. [name] Primary Care Physician: [name] Other Clinician: Referring Physician: Treating Physician/Extender: [name] in Treatment: 21 Education Assessment Education Provided To: Patient Education Topics Provided Wound/Skin Impairment: Methods: Explain/Verbal Responses: State content correctly [employer]) Signed: 07/26/2021 6:00:02 PM By: [name] RN, BSN Entered By: [name] on 07/24/2021 14:16:58 -------------------------------------------------------------------------------- Wound Assessment Details Patient Name: Date of Service: Erin Gerold [name] 07/24/2021 1:15 PM Medical Record Number: [id number] Patient Account Number: [id number] Date of Birth/Sex: Treating RN: 07-Apr-1955 (66 y.o. [name] Primary Care Izaiah Tabb: [name] Other Clinician: Referring Fallen Crisostomo: Treating Mileidy Atkin/Extender: [name] in Treatment: 21 Wound Status Wound Number: 3 Primary Venous Leg Ulcer Etiology: Wound Location: Left, Lateral Lower Leg Wound Open Wounding Event: Blister Status: Date Acquired:  12/13/2020 Comorbid Cataracts, Lymphedema, Hypertension, Peripheral Venous Weeks Of Treatment: 21 History: Disease, Osteoarthritis Clustered Wound: No Photos Wound Measurements Length: (cm) 2.5 Width: (cm) 2.5 Depth: (cm) 0.2 Area: (cm) 4.909 Volume: (cm) 0.982 % Reduction in Area: 83.1% % Reduction in Volume: 83.1% Epithelialization: Medium (34-66%) Tunneling: No Undermining: No Wound Description Classification: Full Thickness Without Exposed Support Structures Wound Margin: Distinct, outline attached Exudate Amount: Medium Exudate Type: Serosanguineous Exudate Color: red, [name] Foul Odor After Cleansing: No Slough/Fibrino Yes Wound Bed Granulation Amount: Large (67-100%) Exposed Structure Granulation Quality: Red, Pink Fascia Exposed: No Necrotic Amount: Small (1-33%) Fat Layer (Subcutaneous Tissue) Exposed: Yes Necrotic Quality: Adherent Slough Tendon Exposed: No Muscle Exposed: No Joint Exposed: No Bone Exposed: No Treatment Notes Wound #3 (Lower Leg) Wound Laterality: Left, Lateral Cleanser Soap and Water Discharge Instruction: May shower and wash wound with dial antibacterial soap and water prior to dressing change. Wound Cleanser Discharge Instruction: Cleanse the wound with wound cleanser prior to applying a clean dressing using gauze sponges, not tissue or cotton balls. Peri-Wound Care Zinc Oxide Ointment 30g tube Discharge Instruction: Apply Zinc Oxide to periwound with each dressing change Sween Lotion (Moisturizing lotion) Discharge Instruction: Apply moisturizing lotion as directed Topical Mupirocin Ointment Discharge Instruction: Apply Mupirocin (Bactroban) as instructed Primary Dressing KerraCel Ag Gelling Fiber Dressing, 4x5 in (silver alginate) Discharge Instruction: Apply silver alginate to wound bed as instructed Secondary Dressing Zetuvit Plus 4x8 in Discharge Instruction: Apply over primary dressing as directed. Secured With Compression  Wrap Unnaboot w/Calamine, 4x10 (in/yd) Discharge Instruction: Apply Unnaboot at top of leg CoFlex TLC XL 2-layer Compression System 4x7 (in/yd) Discharge Instruction: Apply CoFlex 2-layer compression as directed. (alt for 4 layer) Compression Stockings Add-Ons Electronic Signature(s) Signed: 07/26/2021 6:00:02 PM By: [name] RN, BSN Entered By: [name] on 07/24/2021 13:58:22 -------------------------------------------------------------------------------- Wound Assessment Details Patient  Name: Date of Service: ZAMERIA, VOGL 07/24/2021 1:15 PM Medical Record Number: [id number] Patient Account Number: [id number] Date of Birth/Sex: Treating RN: 05-Jul-1955 (66 y.o. [name] Primary Care Meigan Pates: [name] Other Clinician: Referring Lenda Baratta: Treating Rocko Fesperman/Extender: [name] in Treatment: 21 Wound Status Wound Number: 6 Primary Venous Leg Ulcer Etiology: Wound Location: Right, Anterior Lower Leg Wound Healed - Epithelialized Wounding Event: Gradually Appeared Status: Date Acquired: 06/20/2021 Comorbid Cataracts, Lymphedema, Hypertension, Peripheral Venous Weeks Of Treatment: 3 History: Disease, Osteoarthritis Clustered Wound: Yes Photos Wound Measurements Length: (cm) Width: (cm) Depth: (cm) Area: (cm) Volume: (cm) 0 % Reduction in Area: 100% 0 % Reduction in Volume: 100% 0 Epithelialization: Large (67-100%) 0 Tunneling: No 0 Undermining: No Wound Description Classification: Full Thickness Without Exposed Support Structures Wound Margin: Flat and Intact Exudate Amount: None Present Foul Odor After Cleansing: No Slough/Fibrino No Wound Bed Granulation Amount: None Present (0%) Exposed Structure Necrotic Amount: None Present (0%) Fascia Exposed: No Fat Layer (Subcutaneous Tissue) Exposed: No Tendon Exposed: No Muscle Exposed: No Joint Exposed: No Bone Exposed: No Electronic Signature(s) Signed:  07/26/2021 6:00:02 PM By: [name] RN, BSN Entered By: [name] on 07/24/2021 13:58:57 -------------------------------------------------------------------------------- [place] Details Patient Name: Date of Service: Erin Gerold [name] 07/24/2021 1:15 PM Medical Record Number: [id number] Patient Account Number: [id number] Date of Birth/Sex: Treating RN: 1955/06/05 (66 y.o. [name] Primary Care Caedence Snowden: [name] Other Clinician: Referring Courtney Fenlon: Treating Talma Aguillard/Extender: [name] in Treatment: 21 Vital Signs Time Taken: 13:43 Temperature (F): 98.0 Height (in): 62 Pulse (bpm): 80 Weight (lbs): 226 Respiratory Rate (breaths/min): 18 Body Mass Index (BMI): 41.3 Blood Pressure (mmHg): 116/75 Reference Range: 80 - 120 mg / dl Electronic Signature(s) Signed: 07/26/2021 6:00:02 PM By: [name] RN, BSN Entered By: [name] on 07/24/2021 13:44:22

## 2021-07-27 ENCOUNTER — Other Ambulatory Visit (HOSPITAL_COMMUNITY): Payer: Self-pay

## 2021-07-27 ENCOUNTER — Ambulatory Visit: Payer: 59 | Admitting: Internal Medicine

## 2021-07-27 ENCOUNTER — Encounter: Payer: Self-pay | Admitting: Internal Medicine

## 2021-07-27 VITALS — BP 120/62 | HR 86 | Temp 97.8°F | Ht 60.6 in | Wt 233.0 lb

## 2021-07-27 DIAGNOSIS — F4321 Adjustment disorder with depressed mood: Secondary | ICD-10-CM

## 2021-07-27 DIAGNOSIS — E039 Hypothyroidism, unspecified: Secondary | ICD-10-CM

## 2021-07-27 DIAGNOSIS — Z6841 Body Mass Index (BMI) 40.0 and over, adult: Secondary | ICD-10-CM | POA: Diagnosis not present

## 2021-07-27 DIAGNOSIS — I129 Hypertensive chronic kidney disease with stage 1 through stage 4 chronic kidney disease, or unspecified chronic kidney disease: Secondary | ICD-10-CM | POA: Diagnosis not present

## 2021-07-27 DIAGNOSIS — N182 Chronic kidney disease, stage 2 (mild): Secondary | ICD-10-CM

## 2021-07-27 DIAGNOSIS — R7309 Other abnormal glucose: Secondary | ICD-10-CM

## 2021-07-27 MED ORDER — LISINOPRIL 20 MG PO TABS
20.0000 mg | ORAL_TABLET | Freq: Every day | ORAL | 2 refills | Status: DC
  Filled 2021-07-27 – 2021-09-07 (×2): qty 90, 90d supply, fill #0
  Filled 2021-12-04: qty 90, 90d supply, fill #1
  Filled 2022-03-27: qty 90, 90d supply, fill #2

## 2021-07-27 MED ORDER — AMLODIPINE BESYLATE 5 MG PO TABS
5.0000 mg | ORAL_TABLET | Freq: Every day | ORAL | 2 refills | Status: DC
  Filled 2021-07-27 – 2021-11-03 (×2): qty 90, 90d supply, fill #0
  Filled 2022-02-13: qty 90, 90d supply, fill #1
  Filled 2022-05-25: qty 90, 90d supply, fill #2

## 2021-07-27 NOTE — Progress Notes (Unsigned)
[name],acting as a Education administrator for Erin Greenland, MD.,have documented all relevant documentation on the behalf of Erin Greenland, MD,as directed by  Erin Greenland, MD while in the presence of Erin Greenland, MD.  This visit occurred during the SARS-CoV-2 public health emergency.  Safety protocols were in place, including screening questions prior to the visit, additional usage of staff PPE, and extensive cleaning of exam room while observing appropriate contact time as indicated for disinfecting solutions.  Subjective:     Patient ID: [name] , female    DOB: 05/24/1955 , 66 y.o.   MRN: [id number]   Chief Complaint  Patient presents with   Hypertension    HPI  She presents today for BP check. She reports compliance with meds. She denies headaches, chest pain and shortness of breath. She is still working at [employer] in [place]. She is also followed at [facility] for chronic LE wounds/ulcers.   Hypertension This is a chronic problem. The current episode started more than 1 year ago. The problem has been gradually improving since onset. The problem is controlled. Pertinent negatives include no blurred vision, chest pain, palpitations or shortness of breath. Risk factors for coronary artery disease include obesity, sedentary lifestyle and post-menopausal state. Compliance problems include exercise.      Past Medical History:  Diagnosis Date   Arthritis    DOE (dyspnea on exertion)    mild   Hyperlipidemia    Hypertension    Hypothyroidism    Morbid obesity ([place])    Pre-diabetes    Schamberg disease      Family History  Problem Relation Age of Onset   Hypertension Mother    Heart Problems Mother        Pacemaker   Heart attack Father        at age 7.   Heart attack Maternal Grandfather 64       Died of AAA   AAA (abdominal aortic aneurysm) Maternal Grandfather    Heart attack Maternal Uncle    Stroke Paternal Aunt    Alzheimer's disease Maternal  Grandmother      Current Outpatient Medications:    Calcium Carbonate-Vitamin D 600-400 MG-UNIT tablet, Take 1 tablet by mouth daily., Disp: , Rfl:    Cholecalciferol (VITAMIN D) 2000 units tablet, Take 4,000 Units by mouth daily., Disp: , Rfl:    doxycycline (VIBRA-TABS) 100 MG tablet, Take 1 tablet (100 mg total) by mouth 2 (two) times daily for 10 days, Disp: 20 tablet, Rfl: 0   levothyroxine (SYNTHROID) 112 MCG tablet, Take 1 tablet (112 mcg total) by mouth daily., Disp: 90 tablet, Rfl: 1   Magnesium 400 MG TABS, Take by mouth., Disp: , Rfl:    Multiple Vitamins-Minerals (CENTRUM SILVER 50+WOMEN PO), Take by mouth. 1 per day, Disp: , Rfl:    potassium chloride SA (KLOR-CON) 20 MEQ tablet, Take 1 tablet (20 mEq total) by mouth daily as needed (when taking Torsemide)., Disp: 90 tablet, Rfl: 1   simvastatin (ZOCOR) 20 MG tablet, Take 1 tablet (20 mg total) by mouth daily., Disp: 90 tablet, Rfl: 0   torsemide (DEMADEX) 20 MG tablet, Take 1 tablet (20 mg total) by mouth daily as needed (for edema)., Disp: 90 tablet, Rfl: 1   amLODipine (NORVASC) 5 MG tablet, Take 1 tablet (5 mg total) by mouth daily., Disp: 90 tablet, Rfl: 2   lisinopril (ZESTRIL) 20 MG tablet, Take 1 tablet (20 mg total) by mouth daily., Disp:  90 tablet, Rfl: 2   No Known Allergies   Review of Systems  Constitutional: Negative.   Eyes:  Negative for blurred vision.  Respiratory: Negative.  Negative for shortness of breath.   Cardiovascular: Negative.  Negative for chest pain and palpitations.  Neurological: Negative.   Psychiatric/Behavioral: Negative.       Today's Vitals   07/27/21 1500 07/27/21 1520  BP: (!) 142/90 120/62  Pulse: 86   Temp: 97.8 F (36.6 C)   Weight: 233 lb (105.7 kg)   Height: 5' 0.6" (1.539 m)   PainSc: 0-No pain    Body mass index is 44.61 kg/m.  Wt Readings from Last 3 Encounters:  07/27/21 233 lb (105.7 kg)  02/21/21 227 lb (103 kg)  01/12/21 225 lb 12.8 oz (102.4 kg)    BP  Readings from Last 3 Encounters:  07/27/21 120/62  02/21/21 130/70  01/12/21 124/80    Repeat BP: 120/62  Objective:  Physical Exam Vitals and nursing note reviewed.  Constitutional:      Appearance: Normal appearance. She is obese.  HENT:     Head: Normocephalic and atraumatic.  Cardiovascular:     Rate and Rhythm: Normal rate and regular rhythm.     Heart sounds: Murmur heard.  Pulmonary:     Effort: Pulmonary effort is normal.     Breath sounds: Normal breath sounds.  Musculoskeletal:     Cervical back: Normal range of motion.     Right lower leg: Edema present.     Left lower leg: Edema present.     Comments: Ambulatory w/ cane  Skin:    General: Skin is warm.  Neurological:     General: No focal deficit present.     Mental Status: She is alert.  Psychiatric:        Mood and Affect: Mood normal.        Behavior: Behavior normal.      Assessment And Plan:     1. Parenchymal renal hypertension, stage 1 through stage 4 or unspecified chronic kidney disease Comments: Chronic, initially uncontrolled. Repeat BP 120/62. She will c/w current meds. She is encouraged to follow low sodium diet. She will f/u in 6 months.  - CMP14+EGFR  2. Chronic renal disease, stage II Comments: Chronic, I will check renal fxn today. She is encouraged to avoid NSAIDS, keep BP controlled and stay hydrated to decrease risk of CKD progression.   3. Primary hypothyroidism Comments: I will check thyroid panel and adjust meds as needed.  She will f/u in six months for full physical examination.  - TSH + free T4  4. Grief Comments: Unfortunately, her father recently passed. She does not wish to pursue grief counseling at this time.  5. Other abnormal glucose Comments: Her a1c has been elevated in the past. I will recheck this today. She is encouraged to limit her intake of sweetened beverages/foods.  - Hemoglobin A1c  6. Class 3 severe obesity due to excess calories with serious comorbidity  and body mass index (BMI) of 40.0 to 44.9 in adult [facility]) Comments: BMI 44.  She has gained 6 lbs since Jan 2023. She is encouraged to incorporate more exercise into her daily routine.   We discussed the use of Wegovy for obesity. She denies family/personal h/o thyroid cancer. She was given 0.64m samples and advised how to administer the medication. She understands that she will not take her next dose until next week. She is reminded to stop eating when full. She  will rto in 4 weeks for re-evaluation.  Possible side effects d/w patient.   Patient was given opportunity to ask questions. Patient verbalized understanding of the plan and was able to repeat key elements of the plan. All questions were answered to their satisfaction.   I, Erin Greenland, MD, have reviewed all documentation for this visit. The documentation on 07/27/21 for the exam, diagnosis, procedures, and orders are all accurate and complete.   IF YOU HAVE BEEN REFERRED TO A SPECIALIST, IT MAY TAKE 1-2 WEEKS TO SCHEDULE/PROCESS THE REFERRAL. IF YOU HAVE NOT HEARD FROM US/SPECIALIST IN TWO WEEKS, PLEASE GIVE [name] A CALL AT [phone] X [phone].   THE PATIENT IS ENCOURAGED TO PRACTICE SOCIAL DISTANCING DUE TO THE COVID-19 PANDEMIC.

## 2021-07-27 NOTE — Patient Instructions (Signed)
Hypertension, Adult ?Hypertension is another name for high blood pressure. High blood pressure forces your heart to work harder to pump blood. This can cause problems over time. ?There are two numbers in a blood pressure reading. There is a top number (systolic) over a bottom number (diastolic). It is best to have a blood pressure that is below 120/80. ?What are the causes? ?The cause of this condition is not known. Some other conditions can lead to high blood pressure. ?What increases the risk? ?Some lifestyle factors can make you more likely to develop high blood pressure: ?Smoking. ?Not getting enough exercise or physical activity. ?Being overweight. ?Having too much fat, sugar, calories, or salt (sodium) in your diet. ?Drinking too much alcohol. ?Other risk factors include: ?Having any of these conditions: ?Heart disease. ?Diabetes. ?High cholesterol. ?Kidney disease. ?Obstructive sleep apnea. ?Having a family history of high blood pressure and high cholesterol. ?Age. The risk increases with age. ?Stress. ?What are the signs or symptoms? ?High blood pressure may not cause symptoms. Very high blood pressure (hypertensive crisis) may cause: ?Headache. ?Fast or uneven heartbeats (palpitations). ?Shortness of breath. ?Nosebleed. ?Vomiting or feeling like you may vomit (nauseous). ?Changes in how you see. ?Very bad chest pain. ?Feeling dizzy. ?Seizures. ?How is this treated? ?This condition is treated by making healthy lifestyle changes, such as: ?Eating healthy foods. ?Exercising more. ?Drinking less alcohol. ?Your doctor may prescribe medicine if lifestyle changes do not help enough and if: ?Your top number is above 130. ?Your bottom number is above 80. ?Your personal target blood pressure may vary. ?Follow these instructions at home: ?Eating and drinking ? ?If told, follow the DASH eating plan. To follow this plan: ?Fill one half of your plate at each meal with fruits and vegetables. ?Fill one fourth of your plate  at each meal with whole grains. Whole grains include whole-wheat pasta, brown rice, and whole-grain bread. ?Eat or drink low-fat dairy products, such as skim milk or low-fat yogurt. ?Fill one fourth of your plate at each meal with low-fat (lean) proteins. Low-fat proteins include fish, chicken without skin, eggs, beans, and tofu. ?Avoid fatty meat, cured and processed meat, or chicken with skin. ?Avoid pre-made or processed food. ?Limit the amount of salt in your diet to less than 1,500 mg each day. ?Do not drink alcohol if: ?Your doctor tells you not to drink. ?You are pregnant, may be pregnant, or are planning to become pregnant. ?If you drink alcohol: ?Limit how much you have to: ?0-1 drink a day for women. ?0-2 drinks a day for men. ?Know how much alcohol is in your drink. In the [place] one drink equals one 12 oz bottle of beer (355 mL), one 5 oz glass of wine (148 mL), or one 1? oz glass of hard liquor (44 mL). ?Lifestyle ? ?Work with your doctor to stay at a healthy weight or to lose weight. Ask your doctor what the best weight is for you. ?Get at least 30 minutes of exercise that causes your heart to beat faster (aerobic exercise) most days of the week. This may include walking, swimming, or biking. ?Get at least 30 minutes of exercise that strengthens your muscles (resistance exercise) at least 3 days a week. This may include lifting weights or doing Pilates. ?Do not smoke or use any products that contain nicotine or tobacco. If you need help quitting, ask your doctor. ?Check your blood pressure at home as told by your doctor. ?Keep all follow-up visits. ?Medicines ?Take over-the-counter and prescription medicines   only as told by your doctor. Follow directions carefully. ?Do not skip doses of blood pressure medicine. The medicine does not work as well if you skip doses. Skipping doses also puts you at risk for problems. ?Ask your doctor about side effects or reactions to medicines that you should watch  for. ?Contact a doctor if: ?You think you are having a reaction to the medicine you are taking. ?You have headaches that keep coming back. ?You feel dizzy. ?You have swelling in your ankles. ?You have trouble with your vision. ?Get help right away if: ?You get a very bad headache. ?You start to feel mixed up (confused). ?You feel weak or numb. ?You feel faint. ?You have very bad pain in your: ?Chest. ?Belly (abdomen). ?You vomit more than once. ?You have trouble breathing. ?These symptoms may be an emergency. Get help right away. Call 911. ?Do not wait to see if the symptoms will go away. ?Do not drive yourself to the hospital. ?Summary ?Hypertension is another name for high blood pressure. ?High blood pressure forces your heart to work harder to pump blood. ?For most people, a normal blood pressure is less than 120/80. ?Making healthy choices can help lower blood pressure. If your blood pressure does not get lower with healthy choices, you may need to take medicine. ?This information is not intended to replace advice given to you by your health care provider. Make sure you discuss any questions you have with your health care provider. ?Document Revised: 11/17/2020 Document Reviewed: 11/17/2020 ?Elsevier Patient Education ? 2023 [employer] ? ?

## 2021-07-31 ENCOUNTER — Encounter (HOSPITAL_BASED_OUTPATIENT_CLINIC_OR_DEPARTMENT_OTHER): Payer: 59 | Admitting: General Surgery

## 2021-07-31 DIAGNOSIS — Z6841 Body Mass Index (BMI) 40.0 and over, adult: Secondary | ICD-10-CM | POA: Diagnosis not present

## 2021-07-31 DIAGNOSIS — I872 Venous insufficiency (chronic) (peripheral): Secondary | ICD-10-CM | POA: Diagnosis not present

## 2021-07-31 DIAGNOSIS — L97822 Non-pressure chronic ulcer of other part of left lower leg with fat layer exposed: Secondary | ICD-10-CM | POA: Diagnosis not present

## 2021-07-31 DIAGNOSIS — B9562 Methicillin resistant Staphylococcus aureus infection as the cause of diseases classified elsewhere: Secondary | ICD-10-CM | POA: Diagnosis not present

## 2021-07-31 DIAGNOSIS — I87332 Chronic venous hypertension (idiopathic) with ulcer and inflammation of left lower extremity: Secondary | ICD-10-CM | POA: Diagnosis not present

## 2021-07-31 DIAGNOSIS — E669 Obesity, unspecified: Secondary | ICD-10-CM | POA: Diagnosis not present

## 2021-07-31 DIAGNOSIS — L97811 Non-pressure chronic ulcer of other part of right lower leg limited to breakdown of skin: Secondary | ICD-10-CM | POA: Diagnosis not present

## 2021-07-31 DIAGNOSIS — L97828 Non-pressure chronic ulcer of other part of left lower leg with other specified severity: Secondary | ICD-10-CM | POA: Diagnosis not present

## 2021-08-02 ENCOUNTER — Encounter: Payer: Self-pay | Admitting: Internal Medicine

## 2021-08-02 NOTE — Progress Notes (Signed)
DEMARA, LOVER ([id number]) Visit Report for 07/31/2021 Arrival Information Details Patient Name: Date of Service: Erin, [name] 07/31/2021 3:15 PM Medical Record Number: [id number] Patient Account Number: [id number] Date of Birth/Sex: Treating RN: 1955-03-16 (66 y.o. [name] Primary Care Nico Rogness: [name] Other Clinician: Referring Luwanda Starr: Treating Rosell Khouri/Extender: [name] in Treatment: 83 Visit Information History Since Last Visit Added or deleted any medications: No Patient Arrived: [name] Any new allergies or adverse reactions: No Arrival Time: 15:53 Had a fall or experienced change in No Accompanied By: self activities of daily living that may affect Transfer Assistance: None risk of falls: Patient Identification Verified: Yes Signs or symptoms of abuse/neglect since last visito No Secondary Verification Process Completed: Yes Hospitalized since last visit: No Patient Requires Transmission-Based Precautions: No Implantable device outside of the clinic excluding No Patient Has Alerts: No cellular tissue based products placed in the center since last visit: Has Dressing in Place as Prescribed: Yes Has Compression in Place as Prescribed: Yes Pain Present Now: No Electronic Signature(s) Signed: 07/31/2021 5:25:20 PM By: Adline Peals Entered By: Adline Peals on 07/31/2021 15:57:10 -------------------------------------------------------------------------------- Compression Therapy Details Patient Name: Date of Service: Erin, HENDLER D. 07/31/2021 3:15 PM Medical Record Number: [id number] Patient Account Number: [id number] Date of Birth/Sex: Treating RN: 1956/01/10 (66 y.o. [name] Primary Care Emersen Carroll: [name] Other Clinician: Referring Yohann Curl: Treating Lyndell Allaire/Extender: [name] in Treatment: 22 Compression Therapy Performed for Wound Assessment:  Wound #3 Left,Lateral Lower Leg Performed By: Clinician Baruch Gouty, RN Compression Type: Double Layer Post Procedure Diagnosis Same as Pre-procedure Electronic Signature(s) Signed: 08/02/2021 6:21:16 PM By: Baruch Gouty RN, BSN Entered By: Baruch Gouty on 07/31/2021 16:16:27 -------------------------------------------------------------------------------- Encounter Discharge Information Details Patient Name: Date of Service: Erin Face D. 07/31/2021 3:15 PM Medical Record Number: [id number] Patient Account Number: [id number] Date of Birth/Sex: Treating RN: 12/27/1955 (66 y.o. [name] Primary Care Rya Rausch: [name] Other Clinician: Referring Ellyn Rubiano: Treating Monifa Blanchette/Extender: [name] in Treatment: 22 Encounter Discharge Information Items Post Procedure Vitals Discharge Condition: Stable Temperature (F): 98.5 Ambulatory Status: Cane Pulse (bpm): 87 Discharge Destination: Home Respiratory Rate (breaths/min): 20 Transportation: Private Auto Blood Pressure (mmHg): 150/79 Accompanied By: self Schedule Follow-up Appointment: Yes Clinical Summary of Care: Patient Declined Electronic Signature(s) Signed: 07/31/2021 5:25:20 PM By: Adline Peals Entered By: Adline Peals on 07/31/2021 17:25:04 -------------------------------------------------------------------------------- Lower Extremity Assessment Details Patient Name: Date of Service: [name], [name] 07/31/2021 3:15 PM Medical Record Number: [id number] Patient Account Number: [id number] Date of Birth/Sex: Treating RN: 05-18-1955 (66 y.o. [name] Primary Care Sharryn Belding: [name] Other Clinician: Referring Payeton Germani: Treating Aryia Delira/Extender: [name] in Treatment: 22 Edema Assessment Assessed: [Left: No] [Right: No] Edema: [Left: Yes] [Right: Yes] Calf Left: Right: Point of Measurement: 29 cm  From Medial Instep 40.2 cm 42 cm Ankle Left: Right: Point of Measurement: 10 cm From Medial Instep 23.8 cm 26 cm Vascular Assessment Pulses: Dorsalis Pedis Palpable: [Left:Yes] Electronic Signature(s) Signed: 07/31/2021 5:25:20 PM By: Adline Peals Entered By: Adline Peals on 07/31/2021 16:04:54 -------------------------------------------------------------------------------- Multi Wound Chart Details Patient Name: Date of Service: Erin Face D. 07/31/2021 3:15 PM Medical Record Number: [id number] Patient Account Number: [id number] Date of Birth/Sex: Treating RN: [birth year] (66 y.o. [name] Primary Care Fradel Baldonado: [name] Other Clinician: Referring Eilidh Marcano: Treating Tonnie Stillman/Extender: [name] in Treatment: 22 Vital Signs Height(in): 62 Pulse(bpm): 87 Weight(lbs): 226 Blood Pressure(mmHg): 150/79 Body  Mass Index(BMI): 41.3 Temperature(F): 98.5 Respiratory Rate(breaths/min): 20 Photos: [N/A:N/A] Left, Lateral Lower Leg N/A N/A Wound Location: Blister N/A N/A Wounding Event: Venous Leg Ulcer N/A N/A Primary Etiology: Cataracts, Lymphedema, N/A N/A Comorbid History: Hypertension, Peripheral Venous Disease, Osteoarthritis 12/13/2020 N/A N/A Date Acquired: 22 N/A N/A Weeks of Treatment: Open N/A N/A Wound Status: No N/A N/A Wound Recurrence: 2x2x0.1 N/A N/A Measurements L x W x D (cm) 3.142 N/A N/A A (cm) : rea 0.314 N/A N/A Volume (cm) : 89.20% N/A N/A % Reduction in A rea: 94.60% N/A N/A % Reduction in Volume: Full Thickness Without Exposed N/A N/A Classification: Support Structures Medium N/A N/A Exudate A mount: Serosanguineous N/A N/A Exudate Type: red, [name] N/A N/A Exudate Color: Distinct, outline attached N/A N/A Wound Margin: Small (1-33%) N/A N/A Granulation A mount: Red, Pink N/A N/A Granulation Quality: Large (67-100%) N/A N/A Necrotic A mount: Fat Layer (Subcutaneous  Tissue): Yes N/A N/A Exposed Structures: Fascia: No Tendon: No Muscle: No Joint: No Bone: No Medium (34-66%) N/A N/A Epithelialization: Debridement - Selective/Open Wound N/A N/A Debridement: Pre-procedure Verification/Time Out 16:14 N/A N/A Taken: Other N/A N/A Pain Control: Slough N/A N/A Tissue Debrided: Non-Viable Tissue N/A N/A Level: 4 N/A N/A Debridement A (sq cm): rea Curette N/A N/A Instrument: Minimum N/A N/A Bleeding: Pressure N/A N/A Hemostasis A chieved: 1 N/A N/A Procedural Pain: 0 N/A N/A Post Procedural Pain: Procedure was tolerated well N/A N/A Debridement Treatment Response: 2x2x0.1 N/A N/A Post Debridement Measurements L x W x D (cm) 0.314 N/A N/A Post Debridement Volume: (cm) Compression Therapy N/A N/A Procedures Performed: Debridement Treatment Notes Electronic Signature(s) Signed: 07/31/2021 4:16:55 PM By: [name] MD FACS Signed: 07/31/2021 5:28:34 PM By: [name] RN Entered By: [name] on 07/31/2021 16:16:54 -------------------------------------------------------------------------------- Multi-Disciplinary Care Plan Details Patient Name: Date of Service: Erin Face D. 07/31/2021 3:15 PM Medical Record Number: [id number] Patient Account Number: [id number] Date of Birth/Sex: Treating RN: [birth year] (66 y.o. [name] Primary Care Markeeta Scalf: [name] Other Clinician: Referring Beckem Tomberlin: Treating Estellar Cadena/Extender: [name] in Treatment: 92 Multidisciplinary Care Plan reviewed with physician Active Inactive Venous Leg Ulcer Nursing Diagnoses: Actual venous Insuffiency (use after diagnosis is confirmed) Goals: Patient will maintain optimal edema control Date Initiated: 02/21/2021 Target Resolution Date: 09/08/2021 Goal Status: Active Patient/caregiver will verbalize understanding of disease process and disease management Date Initiated: 02/21/2021 Date  Inactivated: 03/29/2021 Target Resolution Date: 03/24/2021 Goal Status: Met Interventions: Assess peripheral edema status every visit. Compression as ordered Provide education on venous insufficiency Notes: Wound/Skin Impairment Nursing Diagnoses: Impaired tissue integrity Knowledge deficit related to ulceration/compromised skin integrity Goals: Patient/caregiver will verbalize understanding of skin care regimen Date Initiated: 02/21/2021 Target Resolution Date: 09/01/2021 Goal Status: Active Ulcer/skin breakdown will have a volume reduction of 30% by week 4 Date Initiated: 02/21/2021 Date Inactivated: 03/29/2021 Target Resolution Date: 03/24/2021 Goal Status: Unmet Unmet Reason: infection Ulcer/skin breakdown will have a volume reduction of 50% by week 8 Date Initiated: 03/29/2021 Date Inactivated: 04/19/2021 Target Resolution Date: 04/21/2021 Goal Status: Met Interventions: Assess patient/caregiver ability to obtain necessary supplies Assess patient/caregiver ability to perform ulcer/skin care regimen upon admission and as needed Assess ulceration(s) every visit Provide education on ulcer and skin care Notes: Electronic Signature(s) Signed: 07/31/2021 5:25:20 PM By: Adline Peals Entered By: Adline Peals on 07/31/2021 16:05:45 -------------------------------------------------------------------------------- Pain Assessment Details Patient Name: Date of Service: Erin, [name] 07/31/2021 3:15 PM Medical Record Number: [id number] Patient Account Number: [id number] Date of Birth/Sex: Treating RN:  [birth year] (66 y.o. [name] Primary Care Kirtan Sada: [name] Other Clinician: Referring Carolyna Yerian: Treating Waneda Klammer/Extender: [name] in Treatment: 22 Active Problems Location of Pain Severity and Description of Pain Patient Has Paino No Site Locations Rate the pain. Current Pain Level: 0 Pain Management and  Medication Current Pain Management: Electronic Signature(s) Signed: 07/31/2021 5:25:20 PM By: Adline Peals Entered By: Adline Peals on 07/31/2021 15:57:35 -------------------------------------------------------------------------------- Patient/Caregiver Education Details Patient Name: Date of Service: [name] 6/19/2023andnbsp3:15 PM Medical Record Number: [id number] Patient Account Number: [id number] Date of Birth/Gender: Treating RN: [birth year] (66 y.o. [name] Primary Care Physician: [name] Other Clinician: Referring Physician: Treating Physician/Extender: [name] in Treatment: 22 Education Assessment Education Provided To: Patient Education Topics Provided Wound/Skin Impairment: Methods: Explain/Verbal Responses: Reinforcements needed, State content correctly Electronic Signature(s) Signed: 07/31/2021 5:25:20 PM By: Adline Peals Entered By: Adline Peals on 07/31/2021 16:05:56 -------------------------------------------------------------------------------- Wound Assessment Details Patient Name: Date of Service: Erin, KAPAUN D. 07/31/2021 3:15 PM Medical Record Number: [id number] Patient Account Number: [id number] Date of Birth/Sex: Treating RN: 09-27-1955 (66 y.o. [name] Primary Care Sandrine Bloodsworth: [name] Other Clinician: Referring Shloimy Michalski: Treating Ariadna Setter/Extender: [name] in Treatment: 22 Wound Status Wound Number: 3 Primary Venous Leg Ulcer Etiology: Wound Location: Left, Lateral Lower Leg Wound Open Wounding Event: Blister Status: Date Acquired: 12/13/2020 Comorbid Cataracts, Lymphedema, Hypertension, Peripheral Venous Weeks Of Treatment: 22 History: Disease, Osteoarthritis Clustered Wound: No Photos Wound Measurements Length: (cm) 2 Width: (cm) 2 Depth: (cm) 0.1 Area: (cm) 3.142 Volume: (cm) 0.314 % Reduction in  Area: 89.2% % Reduction in Volume: 94.6% Epithelialization: Medium (34-66%) Tunneling: No Undermining: No Wound Description Classification: Full Thickness Without Exposed Support Structures Wound Margin: Distinct, outline attached Exudate Amount: Medium Exudate Type: Serosanguineous Exudate Color: red, [name] Foul Odor After Cleansing: No Slough/Fibrino Yes Wound Bed Granulation Amount: Small (1-33%) Exposed Structure Granulation Quality: Red, Pink Fascia Exposed: No Necrotic Amount: Large (67-100%) Fat Layer (Subcutaneous Tissue) Exposed: Yes Necrotic Quality: Adherent Slough Tendon Exposed: No Muscle Exposed: No Joint Exposed: No Bone Exposed: No Treatment Notes Wound #3 (Lower Leg) Wound Laterality: Left, Lateral Cleanser Soap and Water Discharge Instruction: May shower and wash wound with dial antibacterial soap and water prior to dressing change. Wound Cleanser Discharge Instruction: Cleanse the wound with wound cleanser prior to applying a clean dressing using gauze sponges, not tissue or cotton balls. Peri-Wound Care Sween Lotion (Moisturizing lotion) Discharge Instruction: Apply moisturizing lotion as directed Topical Mupirocin Ointment Discharge Instruction: Apply Mupirocin (Bactroban) thin layer to wound bed Primary Dressing KerraCel Ag Gelling Fiber Dressing, 4x5 in (silver alginate) Discharge Instruction: Apply silver alginate to wound bed as instructed Secondary Dressing Zetuvit Plus 4x8 in Discharge Instruction: Apply over primary dressing as directed. Secured With Compression Wrap Unnaboot w/Calamine, 4x10 (in/yd) Discharge Instruction: Apply Unnaboot at top of leg CoFlex TLC XL 2-layer Compression System 4x7 (in/yd) Discharge Instruction: Apply CoFlex 2-layer compression as directed. (alt for 4 layer) Compression Stockings Add-Ons Electronic Signature(s) Signed: 07/31/2021 5:25:20 PM By: Adline Peals Entered By: Adline Peals on 07/31/2021  16:07:02 -------------------------------------------------------------------------------- Vitals Details Patient Name: Date of Service: Erin Gerold [name] 07/31/2021 3:15 PM Medical Record Number: [id number] Patient Account Number: [id number] Date of Birth/Sex: Treating RN: August 13, 1955 (66 y.o. [name] Primary Care Saxon Crosby: [name] Other Clinician: Referring Phung Kotas: Treating Richa Shor/Extender: [name] in Treatment: 22 Vital Signs Time Taken: 15:57 Temperature (F): 98.5 Height (in): 62 Pulse (  bpm): 87 Weight (lbs): 226 Respiratory Rate (breaths/min): 20 Body Mass Index (BMI): 41.3 Blood Pressure (mmHg): 150/79 Reference Range: 80 - 120 mg / dl Electronic Signature(s) Signed: 07/31/2021 5:25:20 PM By: Adline Peals Entered By: Adline Peals on 07/31/2021 15:57:29

## 2021-08-02 NOTE — Progress Notes (Signed)
[name], [name] ([id number]) Visit Report for 07/31/2021 Chief Complaint Document Details Patient Name: Date of Service: [name], [name] 07/31/2021 3:15 PM Medical Record Number: [id number] Patient Account Number: [id number] Date of Birth/Sex: Treating RN: 1955-09-20 (66 y.o. [name] Primary Care Provider: [name] Other Clinician: Referring Provider: Treating Provider/Extender: [name] in Treatment: 22 Information Obtained from: Patient Chief Complaint 04/19/2021: The patient is here for ongoing follow-up regarding 2 left lower extremity wounds. Electronic Signature(s) Signed: 07/31/2021 4:17:01 PM By: [name] MD FACS Entered By: [name] on 07/31/2021 16:17:01 -------------------------------------------------------------------------------- Debridement Details Patient Name: Date of Service: Erin Gerold [name]. 07/31/2021 3:15 PM Medical Record Number: [id number] Patient Account Number: [id number] Date of Birth/Sex: Treating RN: [birth year] (66 y.o. Martyn Malay, Linda Primary Care Provider: [name] Other Clinician: Referring Provider: Treating Provider/Extender: [name] in Treatment: 22 Debridement Performed for Assessment: Wound #3 Left,Lateral Lower Leg Performed By: Physician [name], MD Debridement Type: Debridement Severity of Tissue Pre Debridement: Fat layer exposed Level of Consciousness (Pre-procedure): Awake and Alert Pre-procedure Verification/Time Out Yes - 16:14 Taken: Start Time: 16:15 Pain Control: Other : benzocaine 20% spray T Area Debrided (L x W): otal 2 (cm) x 2 (cm) = 4 (cm) Tissue and other material debrided: Non-Viable, Slough, Slough Level: Non-Viable Tissue Debridement Description: Selective/Open Wound Instrument: Curette Bleeding: Minimum Hemostasis Achieved: Pressure Procedural Pain: 1 Post Procedural Pain: 0 Response to Treatment:  Procedure was tolerated well Level of Consciousness (Post- Awake and Alert procedure): Post Debridement Measurements of Total Wound Length: (cm) 2 Width: (cm) 2 Depth: (cm) 0.1 Volume: (cm) 0.314 Character of Wound/Ulcer Post Debridement: Improved Severity of Tissue Post Debridement: Fat layer exposed Post Procedure Diagnosis Same as Pre-procedure Electronic Signature(s) Signed: 07/31/2021 5:02:10 PM By: [name] MD FACS Signed: 08/02/2021 6:21:16 PM By: Baruch Gouty RN, BSN Entered By: Baruch Gouty on 07/31/2021 16:16:12 -------------------------------------------------------------------------------- HPI Details Patient Name: Date of Service: Erin Face D. 07/31/2021 3:15 PM Medical Record Number: [id number] Patient Account Number: [id number] Date of Birth/Sex: Treating RN: September 09, 1955 (66 y.o. 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 She tells [name] that she fell on the gravel driveway in July. She developed injuries on her distal lower leg which have not healed. She saw her primary physician on 11/15/2017 who noted her left shin injuries. Gave her antibiotics. At that point the wounds were almost circumferential however most were less than 1.5 cm. Weeping edema fluid was noted. She was referred here for evaluation. The patient has a history of chronic lower extremity edema. 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 She has had prior history with leg weeping fluid. She does not wear compression stockings. She is not doing anything specific to these wound areas. The patient has a  history of obesity, arthritis, peripheral vascular disease hypertension lower extremity edema and Schamberg's disease ABI in our clinic was 1.3 on the left 12/17/2017; patient readmitted to the clinic last week. 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 She also has lymphedema. Put her in 3 layer compression and silver alginate last week. She has 3 small wounds with depth  just lateral to the tibia. More problematically than this she has numerous shallow areas some of which are almost canal like in shape with tightly adherent painful debris. It would be very difficult and time- consuming to go through this and attempt to individually debride all these areas.. I changed her to collagen today to see if that would help with any of the surface debris on some of these wounds. Otherwise we will not be able to put this in compression we have to have someone change the dressing. The patient is not eligible for home health 12/25/17 on evaluation today patient actually appears to be doing rather well in regard to the ulcer on her lower extremity. Fortunately there does not appear to be evidence of infection at this time. She has been tolerating the dressing changes without complication. This includes the compression wrap. The only issue she had was that the wrap was initially placed over her bunion region which actually calls her some discomfort and pain. Other than that things seem to be going rather well. 01/01/2018 Seen today for follow-up and management of left lower extremity wound and lymphedema. T oday she presents with a new wound towards to the left lateral LE. Recently treated with a 7 day course of amoxicillin; reason for antibiotic dose is unknown at this time. Tolerating current treatment of collagen with 4- layer wraps. She obtained a venous reflux study on 12/25/17. Studies show on the right abnormal reflux times of the popliteal vein, great  saphenous vein at the saphenofemoral junction at the proximal thigh, great saphenous vein at the mid calf, and origin of the small saphenous vein.No superficial thrombosis. No deep vein thrombosis in the common femoral, femoral,and popliteal veins. Left abnormal reflex times as well of the common femoral vein, popliteal vein, and a great saphenous vein at the saphenofemoral junction, In great saphenous vein at the mid thigh w/o thrombosis. Has any issues or concerns during visit today. Recommended follow-up to vascular specialist due to abnormalities from the venous reflux study. Denies fever, pain, chills, dizziness, nausea, or vomiting. 01/08/18 upon evaluation today the patient actually seems to be showing some signs of improvement in my opinion at this point in regard to the lower extremity ulcerated areas. She still has a lot of drainage but fortunately nothing that appears to be too significant currently. I have been very happy with the overall progress I see today compared to where things were during the last evaluation that I had with her. Nonetheless she has not had her appointment with the vein specialist as of yet in fact we were able to get this approved for her today and confirmed with them she will be seeing them on December 26. Nonetheless in general I do feel like the compression wraps is doing well for her. 01/17/18; quite a bit of improvement since last time I saw this patient she has a small open area remaining on the left lateral calf and even smaller area medially. She has lymphedema chronic stasis changes with distal skin fibrosis. She has an appointment with vascular surgery later this month 01/24/2018; the patient's medial leg has closed. Still a small open area on the left lateral leg. She states that the 4 layer compression we put on last week was too tight and she had to take it off over a few days ago. We have had resultant increase in her lymphedema in the dorsal foot and a  proximal calf. Fortunately that does not seem to have resulted in  any deterioration in her wounds The patient is going to need compression stockings. We have given her measurements to phone elastic therapy in [place]. She has vascular surgery consult on December 26 02/07/18; she's had continuous contraction on the left lateral leg wound which is now very small. Currently using silver alginate under 3 layer compression She saw Dr. Trula Slade of vascular surgery on 02/06/18. It was noted that she had a normal reflux times in the popliteal vein, great saphenous vein at the saphenofemoral junction, great saphenous vein at the proximal thigh great saphenous vein at the mid calf and origin of the small saphenous vein. It was noted that she had significant reflux in the left saphenous veins with diameter measurements in the 0.7-0.8 cm range. It was felt she would benefit from laser ablation to help minimize the risk of ulcer recurrence. It was recommended that she wear 20-30 thigh-high compression stockings and have follow-up in 4-6 weeks 02/17/2018; I thought this lady would be healed however her compression slipped down and she developed increasing swelling and the wound is actually larger. 1/13; we had deterioration last week after the patient's compression slipped down and she developed periwound swelling. I increased her compression before layers we have been using silver alginate we are a lot better again today. She has her compression stockings in waiting 1/23; the patient's wounds are totally healed today. She has her stockings. This was almost circumferential skin damage. She follows up with Dr. Trula Slade of vascular surgery next Monday. READMISSION 02/21/2021 This is a now 66 year old woman that we had in clinic here discharging in January 2020 with wounds on her left calf chronic venous insufficiency. She was discharged with 30/40 stockings. It does not sound like she has worn stockings in about a  year largely from not being able to get them on herself. In November she developed new blisters on her legs an area laterally is opened into a fairly sizable wound. She has weeping posteriorly as well. She has been using Neosporin and Band-Aids. The patient did see Dr. Trula Slade in 2019 and 2020. He felt she might benefit from laser ablation in her left saphenous veins although because of the wound that healed I do not think he went through with it. She might benefit from seeing him again. Her ABI on the left is 1. 1/17; patient's wound on the posterior left calf is closed she has the 2 large areas last week. We put her in 4-layer compression for edema control is a lot better. Our intake nurse noted greenish drainage and odor. We have been using silver alginate 1/25; PCR culture I did have the substantial wound area on the left lateral lower leg showed staff aureus and group A strep. Low titers of coag negative staph which are probably skin contaminants. Resistance detected to tetracycline methicillin and macrolides. I gave her a starter kit of Nuzyra 150 mg x 3 for 2 days then 300 mg for a further 7 days. Marked odor considerable increase in surrounding erythema. We are using Iodoflex last week however I have changed to silver alginate with underlying Bactroban 2/1; she is completing her [place] tomorrow. The degree of erythema around the wounds looks a lot better. Odor has improved. I gave her silver alginate and Bactroban last week and changing her back to Iodoflex to continue with ongoing debridement 2/8; the periwound looks a lot better. Surface of the wound also looks somewhat better although there is still ongoing debridement to be done we have been using  Iodoflex under compression. Primary dressing and silver alginate 2/15; left posterior calf. Some improvement in the surface of the wound but still very gritty we have been using Iodoflex under compression. 04/05/2021: Left lateral posterior  calf. She continues to have significant amounts of drainage, some of which is probably comprised of the Iodoflex microbleeds. There is no odor to the drainage. The satellite lesion on the more posterior aspect of the calf is epithelializing nicely and has contracted quite a bit. There is robust granulation tissue in the dominant wound with minimal adherent slough. 04/12/2021: The satellite lesion has nearly closed. She continues to have good granulation tissue with minimal slough of the larger primary wound. She continues to have a fair amount of drainage, but I think this is less secondary to switching her dressing from Iodoflex to Prisma. 04/19/2021: The satellite lesion is almost completely epithelialized. The larger primary wound continues to contract with good granulation tissue and minimal slough. She is currently in [place] with compression. 04/26/2021: The satellite lesion has closed. The larger primary wound has contracted further and the granulation tissue is robust without being hypertrophic. Minimal slough is present. 05/03/2021: The satellite lesion remains closed. The larger primary wound has a bit of slough, but has also contracted further with good perimeter epithelialization. Good granulation tissue at the wound surface. There was some greenish drainage on the dressing when it was removed; it is not the blue-green typically associated with Pseudomonas aeruginosa, however. 05/10/2021: The primary wound continues to contract. There is minimal slough. The granulation tissue at 9:00 is a bit hypertrophic. No significant drainage. No odor. 05/17/2021: The wound is a little bit smaller today with good granulation tissue. Minimal slough. No significant drainage or odor. 05/24/2021: The wound continues to contract and has a nice base of granulation tissue. Small amount of slough. No concern for infection. 05/31/2021: For some reason, the wound measured slightly larger today but overall it still appears  to be in good condition with a nice base of granulation tissue and minimal slough. 06/07/2021: The wound is smaller today. Good granulation tissue and minimal slough. 06/14/2021: The wound is unchanged in size. She continues to accumulate some slough. Good granulation tissue on the surface. 06/20/2021: The wound is smaller today. Minimal slough with good granulation tissue. 06/27/2021: The wound on her left lateral leg is smaller today with just a bit of slough and eschar accumulation. Unfortunately, she has opened a new superficial wound on her right lower extremity. She has 3+ pitting edema to the knees on that leg and she does not wear compression stockings. 07/04/2021: In addition to the superficial wound on her right lower extremity that she opened up last week, she has opened 3 additional sites on the same leg. Her compression wraps were clearly not in correct position when she came to clinic today as they were at about the mid calf level rather than to the tibial tuberosity. She says that they slipped earlier and she had to come back on Friday to have them redone. The wound on her left lateral leg is perhaps slightly larger with some slough accumulation. 07/11/2021: The superficial wounds on her right lower extremity are nearly closed. They just have a thin layer of eschar overlying them. The wound on her left lateral leg is a little bit shallower and has a bit of slough accumulation. 07/17/2021: The right medial lower extremity leg wounds have almost completely closed; one of them just has a tiny opening with a little bit of serous  drainage. The left lateral leg wound is really unchanged. It seems to be stalled. 07/24/2021: The right leg wounds are completely closed. The left lateral leg wound is actually bigger today. It is a bit more tender. There is a minor accumulation of slough on the surface. 07/31/2021: The culture that I took last week was positive for MRSA. We added mupirocin under the silver  alginate and I prescribed doxycycline. She has been tolerating this well. The wound is smaller today but does have slough accumulation. No significant pain or drainage. Electronic Signature(s) Signed: 07/31/2021 4:17:58 PM By: [name] MD FACS Entered By: [name] on 07/31/2021 16:17:58 -------------------------------------------------------------------------------- Physical Exam Details Patient Name: Date of Service: Erin Gerold [name]. 07/31/2021 3:15 PM Medical Record Number: [id number] Patient Account Number: [id number] Date of Birth/Sex: Treating RN: 1955-04-23 (66 y.o. [name] Primary Care Provider: [name] Other Clinician: Referring Provider: Treating Provider/Extender: [name] in Treatment: 22 Constitutional Hypertensive, asymptomatic. . . . No acute distress.Marland Kitchen Respiratory Normal work of breathing on room air.. Notes 07/31/2021: The wound is smaller today but does have slough accumulation. No significant pain or drainage. Electronic Signature(s) Signed: 07/31/2021 4:19:58 PM By: [name] MD FACS Entered By: [name] on 07/31/2021 16:19:58 -------------------------------------------------------------------------------- Physician Orders Details Patient Name: Date of Service: Erin Gerold [name]. 07/31/2021 3:15 PM Medical Record Number: [id number] Patient Account Number: [id number] Date of Birth/Sex: Treating RN: 1955/04/15 (66 y.o. [name] Primary Care Provider: [name] Other Clinician: Referring Provider: Treating Provider/Extender: [name] in Treatment: 22 Verbal / Phone Orders: No Diagnosis Coding ICD-10 Coding Code Description 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 (idiopathic) with ulcer and inflammation of left lower extremity L97.828 Non-pressure chronic ulcer of other part of left lower leg with other specified severity Follow-up  Appointments ppointment in 1 week. - Dr. Celine Ahr Monday 6/26 at 8:45am Return A Bathing/ Shower/ Hygiene May shower with protection but do not get wound dressing(s) wet. - Ok to use [employer], can purchase at [facility], [facility], or [employer] Edema Control - Lymphedema / SCD / Other Elevate legs to the level of the heart or above for 30 minutes daily and/or when sitting, a frequency of: - throughout the day Avoid standing for long periods of time. Exercise regularly Compression stocking or Garment 20-30 mm/Hg pressure to: - Brochure for Elastic Therapy. Leg measurements included Wound Treatment Wound #3 - Lower Leg Wound Laterality: Left, Lateral Cleanser: Soap and Water 1 x Per Week/30 Days Discharge Instructions: May shower and wash wound with dial antibacterial soap and water prior to dressing change. Cleanser: Wound Cleanser 1 x Per Week/30 Days Discharge Instructions: Cleanse the wound with wound cleanser prior to applying a clean dressing using gauze sponges, not tissue or cotton balls. Peri-Wound Care: Sween Lotion (Moisturizing lotion) 1 x Per Week/30 Days Discharge Instructions: Apply moisturizing lotion as directed Topical: Mupirocin Ointment 1 x Per Week/30 Days Discharge Instructions: Apply Mupirocin (Bactroban) thin layer to wound bed Prim Dressing: KerraCel Ag Gelling Fiber Dressing, 4x5 in (silver alginate) 1 x Per Week/30 Days ary Discharge Instructions: Apply silver alginate to wound bed as instructed Secondary Dressing: Zetuvit Plus 4x8 in 1 x Per Week/30 Days Discharge Instructions: Apply over primary dressing as directed. Compression Wrap: Unnaboot w/Calamine, 4x10 (in/yd) 1 x Per Week/30 Days Discharge Instructions: Apply Unnaboot at top of leg Compression Wrap: CoFlex TLC XL 2-layer Compression System 4x7 (in/yd) 1 x Per Week/30 Days Discharge Instructions: Apply CoFlex 2-layer compression as directed. (  alt for 4 layer) Patient Medications llergies: No Known Drug  Allergies A Notifications Medication Indication Start End prior to debridement 07/31/2021 benzocaine DOSE topical 20 % aerosol - aerosol topical Electronic Signature(s) Signed: 07/31/2021 5:02:10 PM By: [name] MD FACS Signed: 07/31/2021 5:28:34 PM By: [name] RN Previous Signature: 07/31/2021 4:20:11 PM Version By: [name] MD FACS Entered By: [name] on 07/31/2021 16:21:53 -------------------------------------------------------------------------------- Problem List Details Patient Name: Date of Service: Erin Face D. 07/31/2021 3:15 PM Medical Record Number: [id number] Patient Account Number: [id number] Date of Birth/Sex: Treating RN: November 20, 1955 (66 y.o. [name] Primary Care Provider: [name] Other Clinician: Referring Provider: Treating Provider/Extender: [name] in Treatment: 22 Active Problems ICD-10 Encounter Code Description Active Date MDM Diagnosis 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 (idiopathic) with ulcer and inflammation of left 02/21/2021 No Yes lower extremity L97.828 Non-pressure chronic ulcer of other part of left lower leg with other specified 02/21/2021 No Yes severity Inactive Problems ICD-10 Code Description Active Date Inactive Date L03.116 Cellulitis of left lower limb 03/08/2021 03/08/2021 L97.811 Non-pressure chronic ulcer of other part of right lower leg limited to breakdown of skin 06/27/2021 06/27/2021 Resolved Problems Electronic Signature(s) Signed: 07/31/2021 4:16:48 PM By: [name] MD FACS Entered By: [name] on 07/31/2021 16:16:48 -------------------------------------------------------------------------------- Progress Note Details Patient Name: Date of Service: Erin Gerold [name]. 07/31/2021 3:15 PM Medical Record Number: [id number] Patient Account Number: [id number] Date of Birth/Sex: Treating RN: 10/23/1955 (66 y.o. [name] Primary  Care Provider: [name] Other Clinician: Referring Provider: Treating Provider/Extender: [name] in Treatment: 22 Subjective Chief Complaint Information obtained from Patient 04/19/2021: The patient is here for ongoing follow-up regarding 2 left lower extremity wounds. 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 She tells [name] that she fell on the gravel driveway in July. She developed injuries on her distal lower leg which have not healed. She saw her primary physician on 11/15/2017 who noted her left shin injuries. Gave her antibiotics. At that point the wounds were almost circumferential however most were less than 1.5 cm. Weeping edema fluid was noted. She was referred here for evaluation. The patient has a history of chronic lower extremity edema. 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 She has had prior history with leg weeping fluid. She does not wear compression stockings. She is not doing anything specific to these wound areas. 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 She also has lymphedema. Put her in 3 layer compression and silver alginate last week. She has 3 small wounds with depth just lateral to the tibia. More problematically than this she has numerous shallow areas some of which are almost canal like in shape with tightly adherent painful debris. It would be very difficult and time- consuming to go through this and attempt to individually debride all these areas.. I  changed her to collagen today to see if that would help with any of the surface debris on some of these wounds. Otherwise we will not be able to put this in compression we have to  have someone change the dressing. The patient is not eligible for home health 12/25/17 on evaluation today patient actually appears to be doing rather well in regard to the ulcer on her lower extremity. Fortunately there does not appear to be evidence of infection at this time. She has been tolerating the dressing changes without complication. This includes the compression wrap. The only issue she had was that the wrap was initially placed over her bunion region which actually calls her some discomfort and pain. Other than that things seem to be going rather well. 01/01/2018 Seen today for follow-up and management of left lower extremity wound and lymphedema. T oday she presents with a new wound towards to the left lateral LE. Recently treated with a 7 day course of amoxicillin; reason for antibiotic dose is unknown at this time. Tolerating current treatment of collagen with 4- layer wraps. She obtained a venous reflux study on 12/25/17. Studies show on the right abnormal reflux times of the popliteal vein, great saphenous vein at the saphenofemoral junction at the proximal thigh, great saphenous vein at the mid calf, and origin of the small saphenous vein.No superficial thrombosis. No deep vein thrombosis in the common femoral, femoral,and popliteal veins. Left abnormal reflex times as well of the common femoral vein, popliteal vein, and a great saphenous vein at the saphenofemoral junction, In great saphenous vein at the mid thigh w/o thrombosis. Has any issues or concerns during visit today. Recommended follow-up to vascular specialist due to abnormalities from the venous reflux study. Denies fever, pain, chills, dizziness, nausea, or vomiting. 01/08/18 upon evaluation today the patient actually seems to be showing some  signs of improvement in my opinion at this point in regard to the lower extremity ulcerated areas. She still has a lot of drainage but fortunately nothing that appears to be too significant currently. I have been very happy with the overall progress I see today compared to where things were during the last evaluation that I had with her. Nonetheless she has not had her appointment with the vein specialist as of yet in fact we were able to get this approved for her today and confirmed with them she will be seeing them on December 26. Nonetheless in general I do feel like the compression wraps is doing well for her. 01/17/18; quite a bit of improvement since last time I saw this patient she has a small open area remaining on the left lateral calf and even smaller area medially. She has lymphedema chronic stasis changes with distal skin fibrosis. She has an appointment with vascular surgery later this month 01/24/2018; the patient's medial leg has closed. Still a small open area on the left lateral leg. She states that the 4 layer compression we put on last week was too tight and she had to take it off over a few days ago. We have had resultant increase in her lymphedema in the dorsal foot and a proximal calf. Fortunately that does not seem to have resulted in any deterioration in her wounds The patient is going to need compression stockings. We have given her measurements to phone elastic therapy in [place]. She has vascular surgery consult on December 26 02/07/18; she's had continuous contraction on the left lateral leg wound which is now very small. Currently using silver alginate under 3 layer compression She saw Dr. Trula Slade of vascular surgery on 02/06/18. It was noted that she had a normal reflux times in the popliteal vein, great saphenous vein at the saphenofemoral junction,  great saphenous vein at the proximal thigh great saphenous vein at the mid calf and origin of the small saphenous vein. It  was noted that she had significant reflux in the left saphenous veins with diameter measurements in the 0.7-0.8 cm range. It was felt she would benefit from laser ablation to help minimize the risk of ulcer recurrence. It was recommended that she wear 20-30 thigh-high compression stockings and have follow-up in 4-6 weeks 02/17/2018; I thought this lady would be healed however her compression slipped down and she developed increasing swelling and the wound is actually larger. 1/13; we had deterioration last week after the patient's compression slipped down and she developed periwound swelling. I increased her compression before layers we have been using silver alginate we are a lot better again today. She has her compression stockings in waiting 1/23; the patient's wounds are totally healed today. She has her stockings. This was almost circumferential skin damage. She follows up with Dr. Trula Slade of vascular surgery next Monday. READMISSION 02/21/2021 This is a now 66 year old woman that we had in clinic here discharging in January 2020 with wounds on her left calf chronic venous insufficiency. She was discharged with 30/40 stockings. It does not sound like she has worn stockings in about a year largely from not being able to get them on herself. In November she developed new blisters on her legs an area laterally is opened into a fairly sizable wound. She has weeping posteriorly as well. She has been using Neosporin and Band-Aids. The patient did see Dr. Trula Slade in 2019 and 2020. He felt she might benefit from laser ablation in her left saphenous veins although because of the wound that healed I do not think he went through with it. She might benefit from seeing him again. Her ABI on the left is 1. 1/17; patient's wound on the posterior left calf is closed she has the 2 large areas last week. We put her in 4-layer compression for edema control is a lot better. Our intake nurse noted greenish drainage  and odor. We have been using silver alginate 1/25; PCR culture I did have the substantial wound area on the left lateral lower leg showed staff aureus and group A strep. Low titers of coag negative staph which are probably skin contaminants. Resistance detected to tetracycline methicillin and macrolides. I gave her a starter kit of Nuzyra 150 mg x 3 for 2 days then 300 mg for a further 7 days. Marked odor considerable increase in surrounding erythema. We are using Iodoflex last week however I have changed to silver alginate with underlying Bactroban 2/1; she is completing her [place] tomorrow. The degree of erythema around the wounds looks a lot better. Odor has improved. I gave her silver alginate and Bactroban last week and changing her back to Iodoflex to continue with ongoing debridement 2/8; the periwound looks a lot better. Surface of the wound also looks somewhat better although there is still ongoing debridement to be done we have been using Iodoflex under compression. Primary dressing and silver alginate 2/15; left posterior calf. Some improvement in the surface of the wound but still very gritty we have been using Iodoflex under compression. 04/05/2021: Left lateral posterior calf. She continues to have significant amounts of drainage, some of which is probably comprised of the Iodoflex microbleeds. There is no odor to the drainage. The satellite lesion on the more posterior aspect of the calf is epithelializing nicely and has contracted quite a bit. There is robust granulation  tissue in the dominant wound with minimal adherent slough. 04/12/2021: The satellite lesion has nearly closed. She continues to have good granulation tissue with minimal slough of the larger primary wound. She continues to have a fair amount of drainage, but I think this is less secondary to switching her dressing from Iodoflex to Prisma. 04/19/2021: The satellite lesion is almost completely epithelialized. The larger  primary wound continues to contract with good granulation tissue and minimal slough. She is currently in [place] with compression. 04/26/2021: The satellite lesion has closed. The larger primary wound has contracted further and the granulation tissue is robust without being hypertrophic. Minimal slough is present. 05/03/2021: The satellite lesion remains closed. The larger primary wound has a bit of slough, but has also contracted further with good perimeter epithelialization. Good granulation tissue at the wound surface. There was some greenish drainage on the dressing when it was removed; it is not the blue-green typically associated with Pseudomonas aeruginosa, however. 05/10/2021: The primary wound continues to contract. There is minimal slough. The granulation tissue at 9:00 is a bit hypertrophic. No significant drainage. No odor. 05/17/2021: The wound is a little bit smaller today with good granulation tissue. Minimal slough. No significant drainage or odor. 05/24/2021: The wound continues to contract and has a nice base of granulation tissue. Small amount of slough. No concern for infection. 05/31/2021: For some reason, the wound measured slightly larger today but overall it still appears to be in good condition with a nice base of granulation tissue and minimal slough. 06/07/2021: The wound is smaller today. Good granulation tissue and minimal slough. 06/14/2021: The wound is unchanged in size. She continues to accumulate some slough. Good granulation tissue on the surface. 06/20/2021: The wound is smaller today. Minimal slough with good granulation tissue. 06/27/2021: The wound on her left lateral leg is smaller today with just a bit of slough and eschar accumulation. Unfortunately, she has opened a new superficial wound on her right lower extremity. She has 3+ pitting edema to the knees on that leg and she does not wear compression stockings. 07/04/2021: In addition to the superficial wound on her right  lower extremity that she opened up last week, she has opened 3 additional sites on the same leg. Her compression wraps were clearly not in correct position when she came to clinic today as they were at about the mid calf level rather than to the tibial tuberosity. She says that they slipped earlier and she had to come back on Friday to have them redone. The wound on her left lateral leg is perhaps slightly larger with some slough accumulation. 07/11/2021: The superficial wounds on her right lower extremity are nearly closed. They just have a thin layer of eschar overlying them. The wound on her left lateral leg is a little bit shallower and has a bit of slough accumulation. 07/17/2021: The right medial lower extremity leg wounds have almost completely closed; one of them just has a tiny opening with a little bit of serous drainage. The left lateral leg wound is really unchanged. It seems to be stalled. 07/24/2021: The right leg wounds are completely closed. The left lateral leg wound is actually bigger today. It is a bit more tender. There is a minor accumulation of slough on the surface. 07/31/2021: The culture that I took last week was positive for MRSA. We added mupirocin under the silver alginate and I prescribed doxycycline. She has been tolerating this well. The wound is smaller today but does have slough  accumulation. No significant pain or drainage. Patient History Information obtained from Patient. Family History Heart Disease - Mother,Father, Hypertension - Mother,Father, Kidney Disease - Mother, Thyroid Problems - Mother, No family history of Cancer, Diabetes, Hereditary Spherocytosis, Lung Disease, Seizures, Stroke, Tuberculosis. Social History Never smoker, Marital Status - Single, Alcohol Use - Never, Drug Use - No History, Caffeine Use - Daily - coffee. Medical History Eyes Patient has history of Cataracts Hematologic/Lymphatic Patient has history of  Lymphedema Cardiovascular Patient has history of Hypertension, Peripheral Venous Disease Integumentary (Skin) Denies history of History of Burn Musculoskeletal Patient has history of Osteoarthritis Hospitalization/Surgery History - colostomy reversal. 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 Medical A Surgical History Notes nd Constitutional Symptoms (General Health) morbid obesity Cardiovascular schamberg disease, hyperlipidemia Gastrointestinal diverticulitis , h/o obstruction due to diverticulitis , colostomy and colostomy reversal Endocrine hypothyroidism Objective Constitutional Hypertensive, asymptomatic. No acute distress.. Vitals Time Taken: 3:57 PM, Height: 62 in, Weight: 226 lbs, BMI: 41.3, Temperature: 98.5 F, Pulse: 87 bpm, Respiratory Rate: 20 breaths/min, Blood Pressure: 150/79 mmHg. Respiratory Normal work of breathing on room air.. General Notes: 07/31/2021: The wound is smaller today but does have slough accumulation. No significant pain or drainage. Integumentary (Hair, Skin) Wound #3 status is Open. Original cause of wound was Blister. The date acquired was: 12/13/2020. The wound has been in treatment 22 weeks. The wound is located on the Left,Lateral Lower Leg. The wound measures 2cm length x 2cm width x 0.1cm depth; 3.142cm^2 area and 0.314cm^3 volume. There is Fat Layer (Subcutaneous Tissue) exposed. There is no tunneling or undermining noted. There is a medium amount of serosanguineous drainage noted. The wound margin is distinct with the outline attached to the wound base. There is small (1-33%) red, pink granulation within the wound bed. There is a large (67-100%) amount of necrotic tissue within the wound bed including Adherent Slough. Assessment Active Problems ICD-10 Chronic venous hypertension (idiopathic) with ulcer and inflammation of left lower extremity Non-pressure chronic ulcer of other part of left lower leg with other specified  severity Procedures Wound #3 Pre-procedure diagnosis of Wound #3 is a Venous Leg Ulcer located on the Left,Lateral Lower Leg .Severity of Tissue Pre Debridement is: Fat layer exposed. There was a Selective/Open Wound Non-Viable Tissue Debridement with a total area of 4 sq cm performed by [name], MD. With the following instrument(s): Curette to remove Non-Viable tissue/material. Material removed includes [facility] after achieving pain control using Other (benzocaine 20% spray). No specimens were taken. A time out was conducted at 16:14, prior to the start of the procedure. A Minimum amount of bleeding was controlled with Pressure. The procedure was tolerated well with a pain level of 1 throughout and a pain level of 0 following the procedure. Post Debridement Measurements: 2cm length x 2cm width x 0.1cm depth; 0.314cm^3 volume. Character of Wound/Ulcer Post Debridement is improved. Severity of Tissue Post Debridement is: Fat layer exposed. Post procedure Diagnosis Wound #3: Same as Pre-Procedure Pre-procedure diagnosis of Wound #3 is a Venous Leg Ulcer located on the Left,Lateral Lower Leg . There was a Double Layer Compression Therapy Procedure by Baruch Gouty, RN. Post procedure Diagnosis Wound #3: Same as Pre-Procedure Plan Follow-up Appointments: Return Appointment in 1 week. - Dr. Celine Ahr Monday 6/26 Bathing/ Shower/ Hygiene: May shower with protection but do not get wound dressing(s) wet. - Ok to use Market [employer], can purchase at [facility], [facility], or [employer] Edema Control - Lymphedema / SCD / Other: Elevate legs to the level of the heart or  above for 30 minutes daily and/or when sitting, a frequency of: - throughout the day Avoid standing for long periods of time. Exercise regularly Compression stocking or Garment 20-30 mm/Hg pressure to: - Brochure for Elastic Therapy. Leg measurements included The following medication(s) was prescribed: benzocaine topical 20 % aerosol aerosol  topical for prior to debridement was prescribed at facility WOUND #3: - Lower Leg Wound Laterality: Left, Lateral Cleanser: Soap and Water 1 x Per Week/30 Days Discharge Instructions: May shower and wash wound with dial antibacterial soap and water prior to dressing change. Cleanser: Wound Cleanser 1 x Per Week/30 Days Discharge Instructions: Cleanse the wound with wound cleanser prior to applying a clean dressing using gauze sponges, not tissue or cotton balls. Peri-Wound Care: Sween Lotion (Moisturizing lotion) 1 x Per Week/30 Days Discharge Instructions: Apply moisturizing lotion as directed Topical: Mupirocin Ointment 1 x Per Week/30 Days Discharge Instructions: Apply Mupirocin (Bactroban) thin layer to wound bed Prim Dressing: KerraCel Ag Gelling Fiber Dressing, 4x5 in (silver alginate) 1 x Per Week/30 Days ary Discharge Instructions: Apply silver alginate to wound bed as instructed Secondary Dressing: Zetuvit Plus 4x8 in 1 x Per Week/30 Days Discharge Instructions: Apply over primary dressing as directed. Com pression Wrap: Unnaboot w/Calamine, 4x10 (in/yd) 1 x Per Week/30 Days Discharge Instructions: Apply Unnaboot at top of leg Com pression Wrap: CoFlex TLC XL 2-layer Compression System 4x7 (in/yd) 1 x Per Week/30 Days Discharge Instructions: Apply CoFlex 2-layer compression as directed. (alt for 4 layer) 07/31/2021: The wound is smaller today but does have slough accumulation. No significant pain or drainage. I used a curette to debride the slough from the wound. I think the improvement in the wound can be attributed to adequately treating the MRSA infection. We will continue using the topical mupirocin under silver alginate with 4-layer compression. Follow-up in 1 week. Electronic Signature(s) Signed: 07/31/2021 4:20:55 PM By: [name] MD FACS Entered By: [name] on 07/31/2021  16:20:55 -------------------------------------------------------------------------------- HxROS Details Patient Name: Date of Service: Erin Gerold [name]. 07/31/2021 3:15 PM Medical Record Number: [id number] Patient Account Number: [id number] Date of Birth/Sex: Treating RN: [birth year] (66 y.o. [name] Primary Care Provider: [name] Other Clinician: Referring Provider: Treating Provider/Extender: [name] in Treatment: 28 Information Obtained From Patient Constitutional Symptoms (General Health) Medical History: Past Medical History Notes: morbid obesity Eyes Medical History: Positive for: Cataracts Hematologic/Lymphatic Medical History: Positive for: Lymphedema Cardiovascular Medical History: Positive for: Hypertension; Peripheral Venous Disease Past Medical History Notes: schamberg disease, hyperlipidemia Gastrointestinal Medical History: Past Medical History Notes: diverticulitis , h/o obstruction due to diverticulitis , colostomy and colostomy reversal Endocrine Medical History: Past Medical History Notes: hypothyroidism Integumentary (Skin) Medical History: Negative for: History of Burn Musculoskeletal Medical History: Positive for: Osteoarthritis HBO Extended History Items Eyes: Cataracts Immunizations Pneumococcal Vaccine: Received Pneumococcal Vaccination: Yes Received Pneumococcal Vaccination On or After 60th Birthday: Yes Implantable Devices None Hospitalization / Surgery History Type of Hospitalization/Surgery colostomy reversal 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 Family and Social History Cancer: No; Diabetes: No; Heart Disease: Yes - Mother,Father; Hereditary Spherocytosis: No; Hypertension: Yes - Mother,Father; Kidney Disease: Yes - Mother; Lung Disease: No; Seizures: No; Stroke: No; Thyroid Problems: Yes - Mother; Tuberculosis: No; Never smoker; Marital Status - Single; Alcohol Use: Never; Drug Use:  No History; Caffeine Use: Daily - coffee; Financial Concerns: No; Food, Clothing or Shelter Needs: No; Support System Lacking: No; Transportation Concerns: No Electronic Signature(s) Signed: 07/31/2021 5:02:10 PM By: [name] MD FACS Signed: 07/31/2021 5:28:34  PM By: [name] RN Entered By: [name] on 07/31/2021 16:19:16 -------------------------------------------------------------------------------- SuperBill Details Patient Name: Date of Service: Erin Gerold [name]. 07/31/2021 Medical Record Number: [id number] Patient Account Number: [id number] Date of Birth/Sex: Treating RN: [birth year] (66 y.o. [name] Primary Care Provider: [name] Other Clinician: Referring Provider: Treating Provider/Extender: [name] in Treatment: 22 Diagnosis Coding ICD-10 Codes Code Description [phone] Chronic venous hypertension (idiopathic) with ulcer and inflammation of left lower extremity L97.828 Non-pressure chronic ulcer of other part of left lower leg with other specified severity Facility Procedures CPT4 Code: [id number] Description: [phone] - DEBRIDE WOUND 1ST 20 SQ CM OR < ICD-10 Diagnosis Description L97.828 Non-pressure chronic ulcer of other part of left lower leg with other specified se Modifier: verity Quantity: 1 Physician Procedures : CPT4 Code Description Modifier [id number] - WC PHYS LEVEL 3 - EST PT 25 ICD-10 Diagnosis Description L97.828 Non-pressure chronic ulcer of other part of left lower leg with other specified severity 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 (idiopathic)  with ulcer and inflammation of left lower extremity Quantity: 1 : [id number] - WC PHYS DEBR WO ANESTH 20 SQ CM ICD-10 Diagnosis Description L97.828 Non-pressure chronic ulcer of other part of left lower leg with other specified severity Quantity: 1 Electronic Signature(s) Signed: 07/31/2021 4:22:29 PM By: [name] MD FACS Entered By:  [name] on 07/31/2021 16:22:29

## 2021-08-04 ENCOUNTER — Other Ambulatory Visit (HOSPITAL_COMMUNITY): Payer: Self-pay

## 2021-08-07 ENCOUNTER — Encounter (HOSPITAL_BASED_OUTPATIENT_CLINIC_OR_DEPARTMENT_OTHER): Payer: 59 | Admitting: General Surgery

## 2021-08-07 DIAGNOSIS — I87332 Chronic venous hypertension (idiopathic) with ulcer and inflammation of left lower extremity: Secondary | ICD-10-CM | POA: Diagnosis not present

## 2021-08-07 DIAGNOSIS — L97811 Non-pressure chronic ulcer of other part of right lower leg limited to breakdown of skin: Secondary | ICD-10-CM | POA: Diagnosis not present

## 2021-08-07 DIAGNOSIS — Z6841 Body Mass Index (BMI) 40.0 and over, adult: Secondary | ICD-10-CM | POA: Diagnosis not present

## 2021-08-07 DIAGNOSIS — B9562 Methicillin resistant Staphylococcus aureus infection as the cause of diseases classified elsewhere: Secondary | ICD-10-CM | POA: Diagnosis not present

## 2021-08-07 DIAGNOSIS — I872 Venous insufficiency (chronic) (peripheral): Secondary | ICD-10-CM | POA: Diagnosis not present

## 2021-08-07 DIAGNOSIS — E669 Obesity, unspecified: Secondary | ICD-10-CM | POA: Diagnosis not present

## 2021-08-07 DIAGNOSIS — L97828 Non-pressure chronic ulcer of other part of left lower leg with other specified severity: Secondary | ICD-10-CM | POA: Diagnosis not present

## 2021-08-07 DIAGNOSIS — L97822 Non-pressure chronic ulcer of other part of left lower leg with fat layer exposed: Secondary | ICD-10-CM | POA: Diagnosis not present

## 2021-08-10 ENCOUNTER — Encounter: Payer: Self-pay | Admitting: Internal Medicine

## 2021-08-14 ENCOUNTER — Encounter (HOSPITAL_BASED_OUTPATIENT_CLINIC_OR_DEPARTMENT_OTHER): Payer: 59 | Attending: General Surgery | Admitting: General Surgery

## 2021-08-14 DIAGNOSIS — L97822 Non-pressure chronic ulcer of other part of left lower leg with fat layer exposed: Secondary | ICD-10-CM | POA: Diagnosis not present

## 2021-08-14 DIAGNOSIS — E039 Hypothyroidism, unspecified: Secondary | ICD-10-CM | POA: Diagnosis not present

## 2021-08-14 DIAGNOSIS — I87332 Chronic venous hypertension (idiopathic) with ulcer and inflammation of left lower extremity: Secondary | ICD-10-CM | POA: Insufficient documentation

## 2021-08-14 DIAGNOSIS — I129 Hypertensive chronic kidney disease with stage 1 through stage 4 chronic kidney disease, or unspecified chronic kidney disease: Secondary | ICD-10-CM | POA: Diagnosis not present

## 2021-08-14 DIAGNOSIS — R7309 Other abnormal glucose: Secondary | ICD-10-CM | POA: Diagnosis not present

## 2021-08-14 DIAGNOSIS — L97828 Non-pressure chronic ulcer of other part of left lower leg with other specified severity: Secondary | ICD-10-CM | POA: Insufficient documentation

## 2021-08-14 DIAGNOSIS — I872 Venous insufficiency (chronic) (peripheral): Secondary | ICD-10-CM | POA: Diagnosis not present

## 2021-08-14 NOTE — Progress Notes (Signed)
Erin, [name] ([id number]) Visit Report for 08/14/2021 Arrival Information Details Patient Name: Date of Service: Erin, [name] 08/14/2021 1:15 PM Medical Record Number: [id number] Patient Account Number: [id number] Date of Birth/Sex: Treating RN: Feb 10, 1956 (66 y.o. [name] Primary Care Afomia Blackley: [name] Other Clinician: Referring Kaiyon Hynes: Treating Britain Anagnos/Extender: [name] in Treatment: 24 Visit Information History Since Last Visit Added or deleted any medications: No Patient Arrived: [name] Any new allergies or adverse reactions: No Arrival Time: 13:38 Had a fall or experienced change in No Accompanied By: self activities of daily living that may affect Transfer Assistance: None risk of falls: Patient Identification Verified: Yes Signs or symptoms of abuse/neglect since last visito No Patient Requires Transmission-Based Precautions: No Hospitalized since last visit: No Patient Has Alerts: No Implantable device outside of the clinic excluding No cellular tissue based products placed in the center since last visit: Has Dressing in Place as Prescribed: Yes Pain Present Now: No Electronic Signature(s) Signed: 08/14/2021 6:16:24 PM By: [name] RN Entered By: [name] on 08/14/2021 13:40:22 -------------------------------------------------------------------------------- Encounter Discharge Information Details Patient Name: Date of Service: Erin Gerold [name] 08/14/2021 1:15 PM Medical Record Number: [id number] Patient Account Number: [id number] Date of Birth/Sex: Treating RN: 1955/04/03 (66 y.o. [name] Primary Care Alexiss Iturralde: [name] Other Clinician: Referring Lando Alcalde: Treating Raelynn Corron/Extender: [name] in Treatment: 24 Encounter Discharge Information Items Post Procedure Vitals Discharge Condition: Stable Temperature (F): 97.8 Ambulatory Status: Cane Pulse  (bpm): 85 Discharge Destination: Home Respiratory Rate (breaths/min): 16 Transportation: Private Auto Blood Pressure (mmHg): 122/79 Accompanied By: self Schedule Follow-up Appointment: Yes Clinical Summary of Care: Patient Declined Electronic Signature(s) Signed: 08/14/2021 6:16:24 PM By: [name] RN Entered By: [name] on 08/14/2021 18:13:49 -------------------------------------------------------------------------------- Lower Extremity Assessment Details Patient Name: Date of Service: REVECCA, NACHTIGAL 08/14/2021 1:15 PM Medical Record Number: [id number] Patient Account Number: [id number] Date of Birth/Sex: Treating RN: 1956-01-16 (66 y.o. [name] Primary Care Lilyann Gravelle: [name] Other Clinician: Referring Jeane Cashatt: Treating Dondre Catalfamo/Extender: [name] in Treatment: 24 Edema Assessment Assessed: [Left: No] [Right: No] Edema: [Left: Yes] [Right: Yes] Calf Left: Right: Point of Measurement: 29 cm From Medial Instep 40.2 cm 42 cm Ankle Left: Right: Point of Measurement: 10 cm From Medial Instep 24.5 cm 26 cm Electronic Signature(s) Signed: 08/14/2021 6:16:24 PM By: [name] RN Entered By: [name] on 08/14/2021 13:50:40 -------------------------------------------------------------------------------- Multi Wound Chart Details Patient Name: Date of Service: Erin Face D. 08/14/2021 1:15 PM Medical Record Number: [id number] Patient Account Number: [id number] Date of Birth/Sex: Treating RN: Sep 13, 1955 (65 y.o. [name] Primary Care Britiany Silbernagel: [name] Other Clinician: Referring Jedaiah Rathbun: Treating Rahil Passey/Extender: [name] in Treatment: 24 Vital Signs Height(in): 62 Pulse(bpm): 44 Weight(lbs): 226 Blood Pressure(mmHg): 122/79 Body Mass Index(BMI): 41.3 Temperature(F): 97.8 Respiratory Rate(breaths/min): 16 Photos: [N/A:N/A] Left, Lateral Lower  Leg N/A N/A Wound Location: Blister N/A N/A Wounding Event: Venous Leg Ulcer N/A N/A Primary Etiology: Cataracts, Lymphedema, N/A N/A Comorbid History: Hypertension, Peripheral Venous Disease, Osteoarthritis 12/13/2020 N/A N/A Date Acquired: 24 N/A N/A Weeks of Treatment: Open N/A N/A Wound Status: No N/A N/A Wound Recurrence: 1x0.8x0.1 N/A N/A Measurements L x W x D (cm) 0.628 N/A N/A A (cm) : rea 0.063 N/A N/A Volume (cm) : 97.80% N/A N/A % Reduction in Area: 98.90% N/A N/A % Reduction in Volume: Full Thickness Without Exposed N/A N/A Classification: Support Structures Medium N/A N/A Exudate A mount: Serosanguineous  N/A N/A Exudate Type: red, brown N/A N/A Exudate Color: Distinct, outline attached N/A N/A Wound Margin: Small (1-33%) N/A N/A Granulation A mount: Red, Pink N/A N/A Granulation Quality: Large (67-100%) N/A N/A Necrotic A mount: Fat Layer (Subcutaneous Tissue): Yes N/A N/A Exposed Structures: Fascia: No Tendon: No Muscle: No Joint: No Bone: No Medium (34-66%) N/A N/A Epithelialization: Debridement - Excisional N/A N/A Debridement: Pre-procedure Verification/Time Out 13:55 N/A N/A Taken: Lidocaine 5% topical ointment N/A N/A Pain Control: Necrotic/Eschar, Subcutaneous, N/A N/A Tissue Debrided: Slough Skin/Subcutaneous Tissue N/A N/A Level: 0.8 N/A N/A Debridement A (sq cm): rea Curette N/A N/A Instrument: Minimum N/A N/A Bleeding: Pressure N/A N/A Hemostasis Achieved: 0 N/A N/A Procedural Pain: 0 N/A N/A Post Procedural Pain: Debridement Treatment Response: Procedure was tolerated well N/A N/A Post Debridement Measurements L x 1x0.8x0.1 N/A N/A W x D (cm) 0.063 N/A N/A Post Debridement Volume: (cm) Debridement N/A N/A Procedures Performed: Treatment Notes Electronic Signature(s) Signed: 08/14/2021 2:15:59 PM By: [name] MD FACS Signed: 08/14/2021 6:16:24 PM By: [name] RN Entered By: [name]  on 08/14/2021 14:15:59 -------------------------------------------------------------------------------- Multi-Disciplinary Care Plan Details Patient Name: Date of Service: Erin Face D. 08/14/2021 1:15 PM Medical Record Number: [id number] Patient Account Number: [id number] Date of Birth/Sex: Treating RN: Dec 02, 1955 (66 y.o. [name] Primary Care Jemila Camille: [name] Other Clinician: Referring Lakiah Dhingra: Treating Muna Demers/Extender: [name] in Treatment: 24 Multidisciplinary Care Plan reviewed with physician Active Inactive Venous Leg Ulcer Nursing Diagnoses: Actual venous Insuffiency (use after diagnosis is confirmed) Goals: Patient will maintain optimal edema control Date Initiated: 02/21/2021 Target Resolution Date: 09/08/2021 Goal Status: Active Patient/caregiver will verbalize understanding of disease process and disease management Date Initiated: 02/21/2021 Date Inactivated: 03/29/2021 Target Resolution Date: 03/24/2021 Goal Status: Met Interventions: Assess peripheral edema status every visit. Compression as ordered Provide education on venous insufficiency Notes: Wound/Skin Impairment Nursing Diagnoses: Impaired tissue integrity Knowledge deficit related to ulceration/compromised skin integrity Goals: Patient/caregiver will verbalize understanding of skin care regimen Date Initiated: 02/21/2021 Target Resolution Date: 09/08/2021 Goal Status: Active Ulcer/skin breakdown will have a volume reduction of 30% by week 4 Date Initiated: 02/21/2021 Date Inactivated: 03/29/2021 Target Resolution Date: 03/24/2021 Goal Status: Unmet Unmet Reason: infection Ulcer/skin breakdown will have a volume reduction of 50% by week 8 Date Initiated: 03/29/2021 Date Inactivated: 04/19/2021 Target Resolution Date: 04/21/2021 Goal Status: Met Interventions: Assess patient/caregiver ability to obtain necessary supplies Assess patient/caregiver  ability to perform ulcer/skin care regimen upon admission and as needed Assess ulceration(s) every visit Provide education on ulcer and skin care Notes: Electronic Signature(s) Signed: 08/14/2021 6:16:24 PM By: [name] RN Entered By: [name] on 08/14/2021 18:12:54 -------------------------------------------------------------------------------- Pain Assessment Details Patient Name: Date of Service: TARRAH, FURUTA D. 08/14/2021 1:15 PM Medical Record Number: [id number] Patient Account Number: [id number] Date of Birth/Sex: Treating RN: 1955/10/16 (66 y.o. [name] Primary Care Wasil Wolke: [name] Other Clinician: Referring Seirra Kos: Treating Devone Tousley/Extender: [name] in Treatment: 24 Active Problems Location of Pain Severity and Description of Pain Patient Has Paino No Site Locations Pain Management and Medication Current Pain Management: Electronic Signature(s) Signed: 08/14/2021 6:16:24 PM By: [name] RN Entered By: [name] on 08/14/2021 13:41:05 -------------------------------------------------------------------------------- Patient/Caregiver Education Details Patient Name: Date of Service: [name] 7/3/2023andnbsp1:15 PM Medical Record Number: [id number] Patient Account Number: [id number] Date of Birth/Gender: Treating RN: [birth year] (66 y.o. [name] Primary Care Physician: [name] Other Clinician: Referring Physician: Treating Physician/Extender: Vonita Moss  Weeks in Treatment: 24 Education Assessment Education Provided To: Patient Education Topics Provided Wound/Skin Impairment: Methods: Explain/Verbal Responses: Return demonstration correctly Electronic Signature(s) Signed: 08/14/2021 6:16:24 PM By: [name] RN Entered By: [name] on 08/14/2021  18:13:08 -------------------------------------------------------------------------------- Wound Assessment Details Patient Name: Date of Service: DNIYA, NEUHAUS D. 08/14/2021 1:15 PM Medical Record Number: [id number] Patient Account Number: [id number] Date of Birth/Sex: Treating RN: 19-Jan-1956 (66 y.o. [name] Primary Care Sanvika Cuttino: [name] Other Clinician: Referring Augustine Brannick: Treating Winni Ehrhard/Extender: [name] in Treatment: 24 Wound Status Wound Number: 3 Primary Venous Leg Ulcer Etiology: Wound Location: Left, Lateral Lower Leg Wound Open Wounding Event: Blister Status: Date Acquired: 12/13/2020 Comorbid Cataracts, Lymphedema, Hypertension, Peripheral Venous Weeks Of Treatment: 24 History: Disease, Osteoarthritis Clustered Wound: No Photos Wound Measurements Length: (cm) 1 Width: (cm) 0.8 Depth: (cm) 0.1 Area: (cm) 0.628 Volume: (cm) 0.063 % Reduction in Area: 97.8% % Reduction in Volume: 98.9% Epithelialization: Medium (34-66%) Tunneling: No Undermining: No Wound Description Classification: Full Thickness Without Exposed Support Structures Wound Margin: Distinct, outline attached Exudate Amount: Medium Exudate Type: Serosanguineous Exudate Color: red, brown Foul Odor After Cleansing: No Slough/Fibrino Yes Wound Bed Granulation Amount: Small (1-33%) Exposed Structure Granulation Quality: Red, Pink Fascia Exposed: No Necrotic Amount: Large (67-100%) Fat Layer (Subcutaneous Tissue) Exposed: Yes Necrotic Quality: Adherent Slough Tendon Exposed: No Muscle Exposed: No Joint Exposed: No Bone Exposed: No Treatment Notes Wound #3 (Lower Leg) Wound Laterality: Left, Lateral Cleanser Soap and Water Discharge Instruction: May shower and wash wound with dial antibacterial soap and water prior to dressing change. Wound Cleanser Discharge Instruction: Cleanse the wound with wound cleanser prior to applying a clean  dressing using gauze sponges, not tissue or cotton balls. Peri-Wound Care Sween Lotion (Moisturizing lotion) Discharge Instruction: Apply moisturizing lotion as directed Topical Mupirocin Ointment Discharge Instruction: Apply Mupirocin (Bactroban) thin layer to wound bed Primary Dressing KerraCel Ag Gelling Fiber Dressing, 4x5 in (silver alginate) Discharge Instruction: Apply silver alginate to wound bed as instructed Secondary Dressing Zetuvit Plus 4x8 in Discharge Instruction: Apply over primary dressing as directed. Secured With Compression Wrap CoFlex TLC XL 2-layer Compression System 4x7 (in/yd) Discharge Instruction: Apply CoFlex 2-layer compression as directed. (alt for 4 layer) Compression Stockings Add-Ons Electronic Signature(s) Signed: 08/14/2021 6:16:24 PM By: [name] RN Entered By: [name] on 08/14/2021 13:54:49 -------------------------------------------------------------------------------- Vitals Details Patient Name: Date of Service: Erin Gerold [name] 08/14/2021 1:15 PM Medical Record Number: [id number] Patient Account Number: [id number] Date of Birth/Sex: Treating RN: [birth year] (66 y.o. [name] Primary Care Ruqaya Strauss: [name] Other Clinician: Referring Herby Amick: Treating Jearline Hirschhorn/Extender: [name] in Treatment: 24 Vital Signs Time Taken: 13:38 Temperature (F): 97.8 Height (in): 62 Pulse (bpm): 85 Weight (lbs): 226 Respiratory Rate (breaths/min): 16 Body Mass Index (BMI): 41.3 Blood Pressure (mmHg): 122/79 Reference Range: 80 - 120 mg / dl Electronic Signature(s) Signed: 08/14/2021 6:16:24 PM By: [name] RN Entered By: [name] on 08/14/2021 13:40:54

## 2021-08-14 NOTE — Progress Notes (Signed)
[name], [name] ([id number]) Visit Report for 08/14/2021 Chief Complaint Document Details Patient Name: Date of Service: [name], [name] 08/14/2021 1:15 PM Medical Record Number: [id number] Patient Account Number: [id number] Date of Birth/Sex: Treating RN: 1955-11-13 (66 y.o. [name] Primary Care Provider: [name] Other Clinician: Referring Provider: Treating Provider/Extender: [name] in Treatment: 24 Information Obtained from: Patient Chief Complaint 04/19/2021: The patient is here for ongoing follow-up regarding 2 left lower extremity wounds. Electronic Signature(s) Signed: 08/14/2021 2:16:04 PM By: [name] MD FACS Entered By: [name] on 08/14/2021 14:16:04 -------------------------------------------------------------------------------- Debridement Details Patient Name: Date of Service: Erin Gerold [name] 08/14/2021 1:15 PM Medical Record Number: [id number] Patient Account Number: [id number] Date of Birth/Sex: Treating RN: 1955/08/21 (66 y.o. [name] Primary Care Provider: [name] Other Clinician: Referring Provider: Treating Provider/Extender: [name] in Treatment: 24 Debridement Performed for Assessment: Wound #3 Left,Lateral Lower Leg Performed By: Physician [name], MD Debridement Type: Debridement Severity of Tissue Pre Debridement: Fat layer exposed Level of Consciousness (Pre-procedure): Awake and Alert Pre-procedure Verification/Time Out Yes - 13:55 Taken: Start Time: 13:55 Pain Control: Lidocaine 5% topical ointment T Area Debrided (L x W): otal 1 (cm) x 0.8 (cm) = 0.8 (cm) Tissue and other material debrided: Non-Viable, Eschar, Slough, Subcutaneous, Slough Level: Skin/Subcutaneous Tissue Debridement Description: Excisional Instrument: Curette Bleeding: Minimum Hemostasis Achieved: Pressure End Time: 13:57 Procedural Pain: 0 Post Procedural  Pain: 0 Response to Treatment: Procedure was tolerated well Level of Consciousness (Post- Awake and Alert procedure): Post Debridement Measurements of Total Wound Length: (cm) 1 Width: (cm) 0.8 Depth: (cm) 0.1 Volume: (cm) 0.063 Character of Wound/Ulcer Post Debridement: Improved Severity of Tissue Post Debridement: Fat layer exposed Post Procedure Diagnosis Same as Pre-procedure Electronic Signature(s) Signed: 08/14/2021 2:54:17 PM By: [name] MD FACS Signed: 08/14/2021 6:16:24 PM By: [name] RN Entered By: [name] on 08/14/2021 14:01:10 -------------------------------------------------------------------------------- HPI Details Patient Name: Date of Service: Erin Face D. 08/14/2021 1:15 PM Medical Record Number: [id number] Patient Account Number: [id number] Date of Birth/Sex: Treating RN: [birth year] (66 y.o. 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 She tells [name] that she fell on the gravel driveway in July. She developed injuries on her distal lower leg which have not healed. She saw her primary physician on 11/15/2017 who noted her left shin injuries. Gave her antibiotics. At that point the wounds were almost circumferential however most were less than 1.5 cm. Weeping edema fluid was noted. She was referred here for evaluation. The patient has a history of chronic lower extremity edema. 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 She has had prior history with leg weeping fluid. She does not wear compression stockings. She is not doing anything specific to these wound  areas. 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 She also has lymphedema. Put her in 3 layer compression and silver alginate last week. She has 3 small wounds  with depth just lateral to the tibia. More problematically than this she has numerous shallow areas some of which are almost canal like in shape with tightly adherent painful debris. It would be very difficult and time- consuming to go through this and attempt to individually debride all these areas.. I changed her to collagen today to see if that would help with any of the surface debris on some of these wounds. Otherwise we will not be able to put this in compression we have to have someone change the dressing. The patient is not eligible for home health 12/25/17 on evaluation today patient actually appears to be doing rather well in regard to the ulcer on her lower extremity. Fortunately there does not appear to be evidence of infection at this time. She has been tolerating the dressing changes without complication. This includes the compression wrap. The only issue she had was that the wrap was initially placed over her bunion region which actually calls her some discomfort and pain. Other than that things seem to be going rather well. 01/01/2018 Seen today for follow-up and management of left lower extremity wound and lymphedema. T oday she presents with a new wound towards to the left lateral LE. Recently treated with a 7 day course of amoxicillin; reason for antibiotic dose is unknown at this time. Tolerating current treatment of collagen with 4- layer wraps. She obtained a venous reflux study on 12/25/17. Studies show on the right abnormal reflux times of the  popliteal vein, great saphenous vein at the saphenofemoral junction at the proximal thigh, great saphenous vein at the mid calf, and origin of the small saphenous vein.No superficial thrombosis. No deep vein thrombosis in the common femoral, femoral,and popliteal veins. Left abnormal reflex times as well of the common femoral vein, popliteal vein, and a great saphenous vein at the saphenofemoral junction, In great saphenous vein at the mid thigh w/o thrombosis. Has any issues or concerns during visit today. Recommended follow-up to vascular specialist due to abnormalities from the venous reflux study. Denies fever, pain, chills, dizziness, nausea, or vomiting. 01/08/18 upon evaluation today the patient actually seems to be showing some signs of improvement in my opinion at this point in regard to the lower extremity ulcerated areas. She still has a lot of drainage but fortunately nothing that appears to be too significant currently. I have been very happy with the overall progress I see today compared to where things were during the last evaluation that I had with her. Nonetheless she has not had her appointment with the vein specialist as of yet in fact we were able to get this approved for her today and confirmed with them she will be seeing them on December 26. Nonetheless in general I do feel like the compression wraps is doing well for her. 01/17/18; quite a bit of improvement since last time I saw this patient she has a small open area remaining on the left lateral calf and even smaller area medially. She has lymphedema chronic stasis changes with distal skin fibrosis. She has an appointment with vascular surgery later this month 01/24/2018; the patient's medial leg has closed. Still a small open area on the left lateral leg. She states that the 4 layer compression we put on last week was too tight and she had to take it off over a few days ago. We have had resultant increase in her lymphedema in  the dorsal foot and a proximal calf. Fortunately that does not seem to have  resulted in any deterioration in her wounds The patient is going to need compression stockings. We have given her measurements to phone elastic therapy in [place]. She has vascular surgery consult on December 26 02/07/18; she's had continuous contraction on the left lateral leg wound which is now very small. Currently using silver alginate under 3 layer compression She saw Dr. Trula Slade of vascular surgery on 02/06/18. It was noted that she had a normal reflux times in the popliteal vein, great saphenous vein at the saphenofemoral junction, great saphenous vein at the proximal thigh great saphenous vein at the mid calf and origin of the small saphenous vein. It was noted that she had significant reflux in the left saphenous veins with diameter measurements in the 0.7-0.8 cm range. It was felt she would benefit from laser ablation to help minimize the risk of ulcer recurrence. It was recommended that she wear 20-30 thigh-high compression stockings and have follow-up in 4-6 weeks 02/17/2018; I thought this lady would be healed however her compression slipped down and she developed increasing swelling and the wound is actually larger. 1/13; we had deterioration last week after the patient's compression slipped down and she developed periwound swelling. I increased her compression before layers we have been using silver alginate we are a lot better again today. She has her compression stockings in waiting 1/23; the patient's wounds are totally healed today. She has her stockings. This was almost circumferential skin damage. She follows up with Dr. Trula Slade of vascular surgery next Monday. READMISSION 02/21/2021 This is a now 66 year old woman that we had in clinic here discharging in January 2020 with wounds on her left calf chronic venous insufficiency. She was discharged with 30/40 stockings. It does not sound like she has worn  stockings in about a year largely from not being able to get them on herself. In November she developed new blisters on her legs an area laterally is opened into a fairly sizable wound. She has weeping posteriorly as well. She has been using Neosporin and Band-Aids. The patient did see Dr. Trula Slade in 2019 and 2020. He felt she might benefit from laser ablation in her left saphenous veins although because of the wound that healed I do not think he went through with it. She might benefit from seeing him again. Her ABI on the left is 1. 1/17; patient's wound on the posterior left calf is closed she has the 2 large areas last week. We put her in 4-layer compression for edema control is a lot better. Our intake nurse noted greenish drainage and odor. We have been using silver alginate 1/25; PCR culture I did have the substantial wound area on the left lateral lower leg showed staff aureus and group A strep. Low titers of coag negative staph which are probably skin contaminants. Resistance detected to tetracycline methicillin and macrolides. I gave her a starter kit of Nuzyra 150 mg x 3 for 2 days then 300 mg for a further 7 days. Marked odor considerable increase in surrounding erythema. We are using Iodoflex last week however I have changed to silver alginate with underlying Bactroban 2/1; she is completing her [place] tomorrow. The degree of erythema around the wounds looks a lot better. Odor has improved. I gave her silver alginate and Bactroban last week and changing her back to Iodoflex to continue with ongoing debridement 2/8; the periwound looks a lot better. Surface of the wound also looks somewhat better although there is still ongoing debridement to be done we have  been using Iodoflex under compression. Primary dressing and silver alginate 2/15; left posterior calf. Some improvement in the surface of the wound but still very gritty we have been using Iodoflex under compression. 04/05/2021: Left  lateral posterior calf. She continues to have significant amounts of drainage, some of which is probably comprised of the Iodoflex microbleeds. There is no odor to the drainage. The satellite lesion on the more posterior aspect of the calf is epithelializing nicely and has contracted quite a bit. There is robust granulation tissue in the dominant wound with minimal adherent slough. 04/12/2021: The satellite lesion has nearly closed. She continues to have good granulation tissue with minimal slough of the larger primary wound. She continues to have a fair amount of drainage, but I think this is less secondary to switching her dressing from Iodoflex to Prisma. 04/19/2021: The satellite lesion is almost completely epithelialized. The larger primary wound continues to contract with good granulation tissue and minimal slough. She is currently in [place] with compression. 04/26/2021: The satellite lesion has closed. The larger primary wound has contracted further and the granulation tissue is robust without being hypertrophic. Minimal slough is present. 05/03/2021: The satellite lesion remains closed. The larger primary wound has a bit of slough, but has also contracted further with good perimeter epithelialization. Good granulation tissue at the wound surface. There was some greenish drainage on the dressing when it was removed; it is not the blue-green typically associated with Pseudomonas aeruginosa, however. 05/10/2021: The primary wound continues to contract. There is minimal slough. The granulation tissue at 9:00 is a bit hypertrophic. No significant drainage. No odor. 05/17/2021: The wound is a little bit smaller today with good granulation tissue. Minimal slough. No significant drainage or odor. 05/24/2021: The wound continues to contract and has a nice base of granulation tissue. Small amount of slough. No concern for infection. 05/31/2021: For some reason, the wound measured slightly larger today but overall  it still appears to be in good condition with a nice base of granulation tissue and minimal slough. 06/07/2021: The wound is smaller today. Good granulation tissue and minimal slough. 06/14/2021: The wound is unchanged in size. She continues to accumulate some slough. Good granulation tissue on the surface. 06/20/2021: The wound is smaller today. Minimal slough with good granulation tissue. 06/27/2021: The wound on her left lateral leg is smaller today with just a bit of slough and eschar accumulation. Unfortunately, she has opened a new superficial wound on her right lower extremity. She has 3+ pitting edema to the knees on that leg and she does not wear compression stockings. 07/04/2021: In addition to the superficial wound on her right lower extremity that she opened up last week, she has opened 3 additional sites on the same leg. Her compression wraps were clearly not in correct position when she came to clinic today as they were at about the mid calf level rather than to the tibial tuberosity. She says that they slipped earlier and she had to come back on Friday to have them redone. The wound on her left lateral leg is perhaps slightly larger with some slough accumulation. 07/11/2021: The superficial wounds on her right lower extremity are nearly closed. They just have a thin layer of eschar overlying them. The wound on her left lateral leg is a little bit shallower and has a bit of slough accumulation. 07/17/2021: The right medial lower extremity leg wounds have almost completely closed; one of them just has a tiny opening with a little bit  of serous drainage. The left lateral leg wound is really unchanged. It seems to be stalled. 07/24/2021: The right leg wounds are completely closed. The left lateral leg wound is actually bigger today. It is a bit more tender. There is a minor accumulation of slough on the surface. 07/31/2021: The culture that I took last week was positive for MRSA. We added mupirocin  under the silver alginate and I prescribed doxycycline. She has been tolerating this well. The wound is smaller today but does have slough accumulation. No significant pain or drainage. 08/07/2021: She completed her course of doxycycline. The wound looks much better today. It is smaller and the periwound is less inflamed. She does have some slough accumulation on the surface. 08/14/2021: The wound continues to contract. There is slough on the wound surface, but the periwound is intact without inflammation or induration. Electronic Signature(s) Signed: 08/14/2021 2:16:35 PM By: [name] MD FACS Entered By: [name] on 08/14/2021 14:16:35 -------------------------------------------------------------------------------- Physical Exam Details Patient Name: Date of Service: Erin Gerold [name] 08/14/2021 1:15 PM Medical Record Number: [id number] Patient Account Number: [id number] Date of Birth/Sex: Treating RN: 1955/08/29 (66 y.o. [name] Primary Care Provider: [name] Other Clinician: Referring Provider: Treating Provider/Extender: [name] in Treatment: 24 Constitutional . . . . No acute distress.Marland Kitchen Respiratory Normal work of breathing on room air.. Notes 08/14/2021: The wound continues to contract. There is slough on the wound surface, but the periwound is intact without inflammation or induration. Electronic Signature(s) Signed: 08/14/2021 2:17:02 PM By: [name] MD FACS Entered By: [name] on 08/14/2021 14:17:02 -------------------------------------------------------------------------------- Physician Orders Details Patient Name: Date of Service: Erin Gerold [name] 08/14/2021 1:15 PM Medical Record Number: [id number] Patient Account Number: [id number] Date of Birth/Sex: Treating RN: 10/28/1955 (66 y.o. [name] Primary Care Provider: [name] Other Clinician: Referring Provider: Treating  Provider/Extender: [name] in Treatment: 24 Verbal / Phone Orders: No Diagnosis Coding ICD-10 Coding Code Description 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 (idiopathic) with ulcer and inflammation of left lower extremity L97.828 Non-pressure chronic ulcer of other part of left lower leg with other specified severity Follow-up Appointments ppointment in 1 week. - Dr. Celine Ahr Room 3 Monday 7/10 at 10:15 am Return A Bathing/ Shower/ Hygiene May shower with protection but do not get wound dressing(s) wet. - Ok to use [employer], can purchase at [facility], [facility], or [employer] Edema Control - Lymphedema / SCD / Other Elevate legs to the level of the heart or above for 30 minutes daily and/or when sitting, a frequency of: - throughout the day Avoid standing for long periods of time. Exercise regularly Compression stocking or Garment 20-30 mm/Hg pressure to: - Brochure for Elastic Therapy. Leg measurements included Wound Treatment Wound #3 - Lower Leg Wound Laterality: Left, Lateral Cleanser: Soap and Water 1 x Per Week/30 Days Discharge Instructions: May shower and wash wound with dial antibacterial soap and water prior to dressing change. Cleanser: Wound Cleanser 1 x Per Week/30 Days Discharge Instructions: Cleanse the wound with wound cleanser prior to applying a clean dressing using gauze sponges, not tissue or cotton balls. Peri-Wound Care: Sween Lotion (Moisturizing lotion) 1 x Per Week/30 Days Discharge Instructions: Apply moisturizing lotion as directed Topical: Mupirocin Ointment 1 x Per Week/30 Days Discharge Instructions: Apply Mupirocin (Bactroban) thin layer to wound bed Prim Dressing: KerraCel Ag Gelling Fiber Dressing, 4x5 in (silver alginate) 1 x Per Week/30 Days ary Discharge Instructions: Apply silver alginate to wound bed  as instructed Secondary Dressing: Zetuvit Plus 4x8 in 1 x Per Week/30 Days Discharge Instructions: Apply over primary dressing  as directed. Compression Wrap: CoFlex TLC XL 2-layer Compression System 4x7 (in/yd) 1 x Per Week/30 Days Discharge Instructions: Apply CoFlex 2-layer compression as directed. (alt for 4 layer) Electronic Signature(s) Signed: 08/14/2021 2:18:05 PM By: [name] MD FACS Entered By: [name] on 08/14/2021 14:18:04 -------------------------------------------------------------------------------- Problem List Details Patient Name: Date of Service: Erin Gerold [name] 08/14/2021 1:15 PM Medical Record Number: [id number] Patient Account Number: [id number] Date of Birth/Sex: Treating RN: [birth year] (66 y.o. [name] Primary Care Provider: [name] Other Clinician: Referring Provider: Treating Provider/Extender: [name] in Treatment: 24 Active Problems ICD-10 Encounter Code Description Active Date MDM Diagnosis 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 (idiopathic) with ulcer and inflammation of left 02/21/2021 No Yes lower extremity L97.828 Non-pressure chronic ulcer of other part of left lower leg with other specified 02/21/2021 No Yes severity Inactive Problems ICD-10 Code Description Active Date Inactive Date L03.116 Cellulitis of left lower limb 03/08/2021 03/08/2021 L97.811 Non-pressure chronic ulcer of other part of right lower leg limited to breakdown of skin 06/27/2021 06/27/2021 Resolved Problems Electronic Signature(s) Signed: 08/14/2021 2:15:52 PM By: [name] MD FACS Signed: 08/14/2021 2:15:52 PM By: [name] MD FACS Entered By: [name] on 08/14/2021 14:15:52 -------------------------------------------------------------------------------- Progress Note Details Patient Name: Date of Service: Erin Face D. 08/14/2021 1:15 PM Medical Record Number: [id number] Patient Account Number: [id number] Date of Birth/Sex: Treating RN: 03/04/1955 (66 y.o. [name] Primary Care Provider: [name]  Other Clinician: Referring Provider: Treating Provider/Extender: [name] in Treatment: 24 Subjective Chief Complaint Information obtained from Patient 04/19/2021: The patient is here for ongoing follow-up regarding 2 left lower extremity wounds. 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 She tells [name] that she fell on the gravel driveway in July. She developed injuries on her distal lower leg which have not healed. She saw her primary physician on 11/15/2017 who noted her left shin injuries. Gave her antibiotics. At that point the wounds were almost circumferential however most were less than 1.5 cm. Weeping edema fluid was noted. She was referred here for evaluation. The patient has a history of chronic lower extremity edema. 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 She has had prior history with leg weeping fluid. She does not wear compression stockings. She is not doing anything specific to these wound areas. 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 She also has lymphedema. Put her in 3 layer compression and silver alginate last week. She has 3 small wounds with depth just lateral to the tibia. More problematically than this she has numerous shallow areas some of which are almost canal like in shape with tightly adherent painful debris. It would be very difficult and time- consuming to go through this and attempt to individually debride all these areas.. I changed her to collagen today to  see if that would help with any of the surface debris on some of these wounds. Otherwise we will not be able  to put this in compression we have to have someone change the dressing. The patient is not eligible for home health 12/25/17 on evaluation today patient actually appears to be doing rather well in regard to the ulcer on her lower extremity. Fortunately there does not appear to be evidence of infection at this time. She has been tolerating the dressing changes without complication. This includes the compression wrap. The only issue she had was that the wrap was initially placed over her bunion region which actually calls her some discomfort and pain. Other than that things seem to be going rather well. 01/01/2018 Seen today for follow-up and management of left lower extremity wound and lymphedema. T oday she presents with a new wound towards to the left lateral LE. Recently treated with a 7 day course of amoxicillin; reason for antibiotic dose is unknown at this time. Tolerating current treatment of collagen with 4- layer wraps. She obtained a venous reflux study on 12/25/17. Studies show on the right abnormal reflux times of the popliteal vein, great saphenous vein at the saphenofemoral junction at the proximal thigh, great saphenous vein at the mid calf, and origin of the small saphenous vein.No superficial thrombosis. No deep vein thrombosis in the common femoral, femoral,and popliteal veins. Left abnormal reflex times as well of the common femoral vein, popliteal vein, and a great saphenous vein at the saphenofemoral junction, In great saphenous vein at the mid thigh w/o thrombosis. Has any issues or concerns during visit today. Recommended follow-up to vascular specialist due to abnormalities from the venous reflux study. Denies fever, pain, chills, dizziness, nausea, or vomiting. 01/08/18 upon evaluation today the patient actually seems to be showing some signs of improvement in my opinion  at this point in regard to the lower extremity ulcerated areas. She still has a lot of drainage but fortunately nothing that appears to be too significant currently. I have been very happy with the overall progress I see today compared to where things were during the last evaluation that I had with her. Nonetheless she has not had her appointment with the vein specialist as of yet in fact we were able to get this approved for her today and confirmed with them she will be seeing them on December 26. Nonetheless in general I do feel like the compression wraps is doing well for her. 01/17/18; quite a bit of improvement since last time I saw this patient she has a small open area remaining on the left lateral calf and even smaller area medially. She has lymphedema chronic stasis changes with distal skin fibrosis. She has an appointment with vascular surgery later this month 01/24/2018; the patient's medial leg has closed. Still a small open area on the left lateral leg. She states that the 4 layer compression we put on last week was too tight and she had to take it off over a few days ago. We have had resultant increase in her lymphedema in the dorsal foot and a proximal calf. Fortunately that does not seem to have resulted in any deterioration in her wounds The patient is going to need compression stockings. We have given her measurements to phone elastic therapy in [place]. She has vascular surgery consult on December 26 02/07/18; she's had continuous contraction on the left lateral leg wound which is now very small. Currently using silver alginate under 3 layer compression She saw Dr. Trula Slade of vascular surgery on 02/06/18. It was noted that she had a normal reflux times in the popliteal  vein, great saphenous vein at the saphenofemoral junction, great saphenous vein at the proximal thigh great saphenous vein at the mid calf and origin of the small saphenous vein. It was noted that she had significant  reflux in the left saphenous veins with diameter measurements in the 0.7-0.8 cm range. It was felt she would benefit from laser ablation to help minimize the risk of ulcer recurrence. It was recommended that she wear 20-30 thigh-high compression stockings and have follow-up in 4-6 weeks 02/17/2018; I thought this lady would be healed however her compression slipped down and she developed increasing swelling and the wound is actually larger. 1/13; we had deterioration last week after the patient's compression slipped down and she developed periwound swelling. I increased her compression before layers we have been using silver alginate we are a lot better again today. She has her compression stockings in waiting 1/23; the patient's wounds are totally healed today. She has her stockings. This was almost circumferential skin damage. She follows up with Dr. Trula Slade of vascular surgery next Monday. READMISSION 02/21/2021 This is a now 66 year old woman that we had in clinic here discharging in January 2020 with wounds on her left calf chronic venous insufficiency. She was discharged with 30/40 stockings. It does not sound like she has worn stockings in about a year largely from not being able to get them on herself. In November she developed new blisters on her legs an area laterally is opened into a fairly sizable wound. She has weeping posteriorly as well. She has been using Neosporin and Band-Aids. The patient did see Dr. Trula Slade in 2019 and 2020. He felt she might benefit from laser ablation in her left saphenous veins although because of the wound that healed I do not think he went through with it. She might benefit from seeing him again. Her ABI on the left is 1. 1/17; patient's wound on the posterior left calf is closed she has the 2 large areas last week. We put her in 4-layer compression for edema control is a lot better. Our intake nurse noted greenish drainage and odor. We have been using silver  alginate 1/25; PCR culture I did have the substantial wound area on the left lateral lower leg showed staff aureus and group A strep. Low titers of coag negative staph which are probably skin contaminants. Resistance detected to tetracycline methicillin and macrolides. I gave her a starter kit of Nuzyra 150 mg x 3 for 2 days then 300 mg for a further 7 days. Marked odor considerable increase in surrounding erythema. We are using Iodoflex last week however I have changed to silver alginate with underlying Bactroban 2/1; she is completing her [place] tomorrow. The degree of erythema around the wounds looks a lot better. Odor has improved. I gave her silver alginate and Bactroban last week and changing her back to Iodoflex to continue with ongoing debridement 2/8; the periwound looks a lot better. Surface of the wound also looks somewhat better although there is still ongoing debridement to be done we have been using Iodoflex under compression. Primary dressing and silver alginate 2/15; left posterior calf. Some improvement in the surface of the wound but still very gritty we have been using Iodoflex under compression. 04/05/2021: Left lateral posterior calf. She continues to have significant amounts of drainage, some of which is probably comprised of the Iodoflex microbleeds. There is no odor to the drainage. The satellite lesion on the more posterior aspect of the calf is epithelializing nicely and has  contracted quite a bit. There is robust granulation tissue in the dominant wound with minimal adherent slough. 04/12/2021: The satellite lesion has nearly closed. She continues to have good granulation tissue with minimal slough of the larger primary wound. She continues to have a fair amount of drainage, but I think this is less secondary to switching her dressing from Iodoflex to Prisma. 04/19/2021: The satellite lesion is almost completely epithelialized. The larger primary wound continues to contract with  good granulation tissue and minimal slough. She is currently in [place] with compression. 04/26/2021: The satellite lesion has closed. The larger primary wound has contracted further and the granulation tissue is robust without being hypertrophic. Minimal slough is present. 05/03/2021: The satellite lesion remains closed. The larger primary wound has a bit of slough, but has also contracted further with good perimeter epithelialization. Good granulation tissue at the wound surface. There was some greenish drainage on the dressing when it was removed; it is not the blue-green typically associated with Pseudomonas aeruginosa, however. 05/10/2021: The primary wound continues to contract. There is minimal slough. The granulation tissue at 9:00 is a bit hypertrophic. No significant drainage. No odor. 05/17/2021: The wound is a little bit smaller today with good granulation tissue. Minimal slough. No significant drainage or odor. 05/24/2021: The wound continues to contract and has a nice base of granulation tissue. Small amount of slough. No concern for infection. 05/31/2021: For some reason, the wound measured slightly larger today but overall it still appears to be in good condition with a nice base of granulation tissue and minimal slough. 06/07/2021: The wound is smaller today. Good granulation tissue and minimal slough. 06/14/2021: The wound is unchanged in size. She continues to accumulate some slough. Good granulation tissue on the surface. 06/20/2021: The wound is smaller today. Minimal slough with good granulation tissue. 06/27/2021: The wound on her left lateral leg is smaller today with just a bit of slough and eschar accumulation. Unfortunately, she has opened a new superficial wound on her right lower extremity. She has 3+ pitting edema to the knees on that leg and she does not wear compression stockings. 07/04/2021: In addition to the superficial wound on her right lower extremity that she opened up last  week, she has opened 3 additional sites on the same leg. Her compression wraps were clearly not in correct position when she came to clinic today as they were at about the mid calf level rather than to the tibial tuberosity. She says that they slipped earlier and she had to come back on Friday to have them redone. The wound on her left lateral leg is perhaps slightly larger with some slough accumulation. 07/11/2021: The superficial wounds on her right lower extremity are nearly closed. They just have a thin layer of eschar overlying them. The wound on her left lateral leg is a little bit shallower and has a bit of slough accumulation. 07/17/2021: The right medial lower extremity leg wounds have almost completely closed; one of them just has a tiny opening with a little bit of serous drainage. The left lateral leg wound is really unchanged. It seems to be stalled. 07/24/2021: The right leg wounds are completely closed. The left lateral leg wound is actually bigger today. It is a bit more tender. There is a minor accumulation of slough on the surface. 07/31/2021: The culture that I took last week was positive for MRSA. We added mupirocin under the silver alginate and I prescribed doxycycline. She has been tolerating this well. The  wound is smaller today but does have slough accumulation. No significant pain or drainage. 08/07/2021: She completed her course of doxycycline. The wound looks much better today. It is smaller and the periwound is less inflamed. She does have some slough accumulation on the surface. 08/14/2021: The wound continues to contract. There is slough on the wound surface, but the periwound is intact without inflammation or induration. Patient History Information obtained from Patient. Family History Heart Disease - Mother,Father, Hypertension - Mother,Father, Kidney Disease - Mother, Thyroid Problems - Mother, No family history of Cancer, Diabetes, Hereditary Spherocytosis, Lung Disease,  Seizures, Stroke, Tuberculosis. Social History Never smoker, Marital Status - Single, Alcohol Use - Never, Drug Use - No History, Caffeine Use - Daily - coffee. Medical History Eyes Patient has history of Cataracts Hematologic/Lymphatic Patient has history of Lymphedema Cardiovascular Patient has history of Hypertension, Peripheral Venous Disease Integumentary (Skin) Denies history of History of Burn Musculoskeletal Patient has history of Osteoarthritis Hospitalization/Surgery History - colostomy reversal. 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 Medical A Surgical History Notes nd Constitutional Symptoms (General Health) morbid obesity Cardiovascular schamberg disease, hyperlipidemia Gastrointestinal diverticulitis , h/o obstruction due to diverticulitis , colostomy and colostomy reversal Endocrine hypothyroidism Objective Constitutional No acute distress.. Vitals Time Taken: 1:38 PM, Height: 62 in, Weight: 226 lbs, BMI: 41.3, Temperature: 97.8 F, Pulse: 85 bpm, Respiratory Rate: 16 breaths/min, Blood Pressure: 122/79 mmHg. Respiratory Normal work of breathing on room air.. General Notes: 08/14/2021: The wound continues to contract. There is slough on the wound surface, but the periwound is intact without inflammation or induration. Integumentary (Hair, Skin) Wound #3 status is Open. Original cause of wound was Blister. The date acquired was: 12/13/2020. The wound has been in treatment 24 weeks. The wound is located on the Left,Lateral Lower Leg. The wound measures 1cm length x 0.8cm width x 0.1cm depth; 0.628cm^2 area and 0.063cm^3 volume. There is Fat Layer (Subcutaneous Tissue) exposed. There is no tunneling or undermining noted. There is a medium amount of serosanguineous drainage noted. The wound margin is distinct with the outline attached to the wound base. There is small (1-33%) red, pink granulation within the wound bed. There is a large (67-100%) amount of necrotic tissue  within the wound bed including Adherent Slough. 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 There was a Excisional Skin/Subcutaneous Tissue Debridement with a total area of 0.8 sq cm performed by [name], MD. With the following instrument(s): Curette to remove Non-Viable tissue/material. Material removed includes Eschar, Subcutaneous Tissue, and Slough after achieving pain control using Lidocaine 5% topical ointment. No specimens were taken. A time out was conducted at 13:55, prior to the start of the procedure. A Minimum amount of bleeding was controlled with Pressure. The procedure was tolerated well with a pain level of 0 throughout and a pain level of 0 following the procedure. Post Debridement Measurements: 1cm length x 0.8cm width x 0.1cm depth; 0.063cm^3 volume. Character of Wound/Ulcer Post Debridement is improved. Severity of Tissue Post Debridement is: Fat layer exposed. Post procedure Diagnosis Wound #3: Same as Pre-Procedure Plan Follow-up Appointments: Return Appointment in 1 week. - Dr. Celine Ahr Room 3 Monday 7/10 at 10:15 am Bathing/ Shower/ Hygiene: May shower with protection but  do not get wound dressing(s) wet. - Ok to use [employer], can purchase at [facility], [facility], or [employer] Edema Control - Lymphedema / SCD / Other: Elevate legs to the level of the heart or above for 30 minutes daily and/or when sitting, a frequency of: - throughout the day Avoid standing for long periods of time. Exercise regularly Compression stocking or Garment 20-30 mm/Hg pressure to: - Brochure for Elastic Therapy. Leg measurements included WOUND  #3: - Lower Leg Wound Laterality: Left, Lateral Cleanser: Soap and Water 1 x Per Week/30 Days Discharge Instructions: May shower and wash wound with dial antibacterial soap and water prior to dressing change. Cleanser: Wound Cleanser 1 x Per Week/30 Days Discharge Instructions: Cleanse the wound with wound cleanser prior to applying a clean dressing using gauze sponges, not tissue or cotton balls. Peri-Wound Care: Sween Lotion (Moisturizing lotion) 1 x Per Week/30 Days Discharge Instructions: Apply moisturizing lotion as directed Topical: Mupirocin Ointment 1 x Per Week/30 Days Discharge Instructions: Apply Mupirocin (Bactroban) thin layer to wound bed Prim Dressing: KerraCel Ag Gelling Fiber Dressing, 4x5 in (silver alginate) 1 x Per Week/30 Days ary Discharge Instructions: Apply silver alginate to wound bed as instructed Secondary Dressing: Zetuvit Plus 4x8 in 1 x Per Week/30 Days Discharge Instructions: Apply over primary dressing as directed. Com pression Wrap: CoFlex TLC XL 2-layer Compression System 4x7 (in/yd) 1 x Per Week/30 Days Discharge Instructions: Apply CoFlex 2-layer compression as directed. (alt for 4 layer) 08/14/2021: The wound continues to contract. There is slough on the wound surface, but the periwound is intact without inflammation or induration. I used a curette to debride the slough from the wound surface. We will continue topical mupirocin with silver alginate and Coflex compression. Follow-up in 1 week. Electronic Signature(s) Signed: 08/14/2021 2:18:33 PM By: [name] MD FACS Entered By: [name] on 08/14/2021 14:18:33 -------------------------------------------------------------------------------- HxROS Details Patient Name: Date of Service: Erin Gerold [name] 08/14/2021 1:15 PM Medical Record Number: [id number] Patient Account Number: [id number] Date of Birth/Sex: Treating RN: [birth year] (66 y.o. [name] Primary Care Provider: [name] Other Clinician: Referring Provider: Treating Provider/Extender: [name] in Treatment: 24 Information Obtained From Patient Constitutional Symptoms (General Health) Medical History: Past Medical History Notes: morbid obesity Eyes Medical History: Positive for: Cataracts Hematologic/Lymphatic Medical History: Positive for: Lymphedema Cardiovascular Medical History: Positive for: Hypertension; Peripheral Venous Disease Past Medical History Notes: schamberg disease, hyperlipidemia Gastrointestinal Medical History: Past Medical History Notes: diverticulitis , h/o obstruction due to diverticulitis , colostomy and colostomy reversal Endocrine Medical History: Past Medical History Notes: hypothyroidism Integumentary (Skin) Medical History: Negative for: History of Burn Musculoskeletal Medical History: Positive for: Osteoarthritis HBO Extended History Items Eyes: Cataracts Immunizations Pneumococcal Vaccine: Received Pneumococcal Vaccination: Yes Received Pneumococcal Vaccination On or After 60th Birthday: Yes Implantable Devices None Hospitalization / Surgery History Type of Hospitalization/Surgery colostomy reversal 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 Family and Social History Cancer: No; Diabetes: No; Heart Disease: Yes - Mother,Father; Hereditary Spherocytosis: No; Hypertension: Yes - Mother,Father; Kidney Disease: Yes - Mother; Lung Disease: No; Seizures: No; Stroke: No; Thyroid Problems: Yes - Mother; Tuberculosis: No; Never smoker; Marital Status - Single; Alcohol Use: Never; Drug Use: No History; Caffeine Use: Daily - coffee; Financial Concerns: No; Food, Clothing or Shelter Needs: No; Support System Lacking: No; Transportation Concerns: No Electronic Signature(s) Signed: 08/14/2021 2:54:17 PM By: [name] MD FACS Signed: 08/14/2021 6:16:24 PM By: [name] RN Entered By: [name] on 08/14/2021  14:16:41 -------------------------------------------------------------------------------- SuperBill Details  Patient Name: Date of Service: ALEESHA, RINGSTAD 08/14/2021 Medical Record Number: [id number] Patient Account Number: [id number] Date of Birth/Sex: Treating RN: Nov 10, 1955 (66 y.o. [name] Primary Care Provider: [name] Other Clinician: Referring Provider: Treating Provider/Extender: [name] in Treatment: 24 Diagnosis Coding ICD-10 Codes Code Description [phone] Chronic venous hypertension (idiopathic) with ulcer and inflammation of left lower extremity L97.828 Non-pressure chronic ulcer of other part of left lower leg with other specified severity Facility Procedures CPT4 Code: [id number] Description: 22449 - DEB SUBQ TISSUE 20 SQ CM/< ICD-10 Diagnosis Description L97.828 Non-pressure chronic ulcer of other part of left lower leg with other specified Modifier: severity Quantity: 1 Physician Procedures : CPT4 Code Description Modifier [id number] 10211 - WC PHYS LEVEL 3 - EST PT 25 ICD-10 Diagnosis Description 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 (idiopathic) with ulcer and inflammation of left lower extremity L97.828 Non-pressure chronic ulcer of other  part of left lower leg with other specified severity Quantity: 1 : [id number] 14103 - WC PHYS SUBQ TISS 20 SQ CM ICD-10 Diagnosis Description L97.828 Non-pressure chronic ulcer of other part of left lower leg with other specified severity Quantity: 1 Electronic Signature(s) Signed: 08/14/2021 2:18:49 PM By: [name] MD FACS Entered By: [name] on 08/14/2021 14:18:49

## 2021-08-15 LAB — TSH+FREE T4
Free T4: 1.65 ng/dL (ref 0.82–1.77)
TSH: 0.811 u[IU]/mL (ref 0.450–4.500)

## 2021-08-15 LAB — CMP14+EGFR
ALT: 16 IU/L (ref 0–32)
AST: 22 IU/L (ref 0–40)
Albumin/Globulin Ratio: 1.6 (ref 1.2–2.2)
Albumin: 4.6 g/dL (ref 3.8–4.8)
Alkaline Phosphatase: 82 IU/L (ref 44–121)
BUN/Creatinine Ratio: 19 (ref 12–28)
BUN: 21 mg/dL (ref 8–27)
Bilirubin Total: 0.7 mg/dL (ref 0.0–1.2)
CO2: 21 mmol/L (ref 20–29)
Calcium: 10.2 mg/dL (ref 8.7–10.3)
Chloride: 104 mmol/L (ref 96–106)
Creatinine, Ser: 1.09 mg/dL — ABNORMAL HIGH (ref 0.57–1.00)
Globulin, Total: 2.9 g/dL (ref 1.5–4.5)
Glucose: 83 mg/dL (ref 70–99)
Potassium: 4.4 mmol/L (ref 3.5–5.2)
Sodium: 139 mmol/L (ref 134–144)
Total Protein: 7.5 g/dL (ref 6.0–8.5)
eGFR: 55 mL/min/{1.73_m2} — ABNORMAL LOW (ref >59)

## 2021-08-15 LAB — HEMOGLOBIN A1C
Est. average glucose Bld gHb Est-mCnc: 117 mg/dL
Hgb A1c MFr Bld: 5.7 % — ABNORMAL HIGH (ref 4.8–5.6)

## 2021-08-16 ENCOUNTER — Encounter: Payer: Self-pay | Admitting: Internal Medicine

## 2021-08-21 ENCOUNTER — Encounter (HOSPITAL_BASED_OUTPATIENT_CLINIC_OR_DEPARTMENT_OTHER): Payer: 59 | Admitting: General Surgery

## 2021-08-21 DIAGNOSIS — I87332 Chronic venous hypertension (idiopathic) with ulcer and inflammation of left lower extremity: Secondary | ICD-10-CM | POA: Diagnosis not present

## 2021-08-21 DIAGNOSIS — L97828 Non-pressure chronic ulcer of other part of left lower leg with other specified severity: Secondary | ICD-10-CM | POA: Diagnosis not present

## 2021-08-21 DIAGNOSIS — L97822 Non-pressure chronic ulcer of other part of left lower leg with fat layer exposed: Secondary | ICD-10-CM | POA: Diagnosis not present

## 2021-08-21 DIAGNOSIS — I872 Venous insufficiency (chronic) (peripheral): Secondary | ICD-10-CM | POA: Diagnosis not present

## 2021-08-21 NOTE — Progress Notes (Signed)
ANEIRA, CAVITT ([id number]) Visit Report for 08/21/2021 Arrival Information Details Patient Name: Date of Service: SHAKELA, DONATI 08/21/2021 10:15 A M Medical Record Number: [id number] Patient Account Number: [id number] Date of Birth/Sex: Treating RN: 02-28-1955 (66 y.o. [name] Primary Care Michaeline Eckersley: [name] Other Clinician: Referring Stanley Helmuth: Treating Noeli Lavery/Extender: [name] in Treatment: 25 Visit Information History Since Last Visit Added or deleted any medications: No Patient Arrived: [name] Any new allergies or adverse reactions: No Arrival Time: 10:15 Had a fall or experienced change in No Accompanied By: self activities of daily living that may affect Transfer Assistance: None risk of falls: Patient Identification Verified: Yes Signs or symptoms of abuse/neglect since last visito No Patient Requires Transmission-Based Precautions: No Hospitalized since last visit: No Patient Has Alerts: No Implantable device outside of the clinic excluding No cellular tissue based products placed in the center since last visit: Has Dressing in Place as Prescribed: Yes Has Compression in Place as Prescribed: Yes Pain Present Now: No Electronic Signature(s) Signed: 08/21/2021 5:44:50 PM By: [name] RN Entered By: [name] on 08/21/2021 10:19:03 -------------------------------------------------------------------------------- Compression Therapy Details Patient Name: Date of Service: [name] 08/21/2021 10:15 A M Medical Record Number: [id number] Patient Account Number: [id number] Date of Birth/Sex: Treating RN: [birth year] (66 y.o. [name] Primary Care Shivansh Hardaway: [name] Other Clinician: Referring Xavious Sharrar: Treating Ryder Chesmore/Extender: [name] in Treatment: 25 Compression Therapy Performed for Wound Assessment: Wound #3 Left,Lateral Lower Leg Performed By:  Clinician [name], RN Compression Type: Double Layer Post Procedure Diagnosis Same as Pre-procedure Electronic Signature(s) Signed: 08/21/2021 5:44:50 PM By: [name] RN Entered By: [name] on 08/21/2021 11:07:17 -------------------------------------------------------------------------------- Encounter Discharge Information Details Patient Name: Date of Service: [name] 08/21/2021 10:15 A M Medical Record Number: [id number] Patient Account Number: [id number] Date of Birth/Sex: Treating RN: Jan 30, 1956 (66 y.o. [name] Primary Care [name] Belger: [name] Other Clinician: Referring Ivania Teagarden: Treating Jaymian Bogart/Extender: [name] in Treatment: 25 Encounter Discharge Information Items Post Procedure Vitals Discharge Condition: Stable Temperature (F): 97.6 Ambulatory Status: Cane Pulse (bpm): 75 Discharge Destination: Home Respiratory Rate (breaths/min): 16 Transportation: Private Auto Blood Pressure (mmHg): 125/77 Accompanied By: self Schedule Follow-up Appointment: Yes Clinical Summary of Care: Patient Declined Electronic Signature(s) Signed: 08/21/2021 5:44:50 PM By: [name] RN Entered By: [name] on 08/21/2021 17:44:23 -------------------------------------------------------------------------------- Lower Extremity Assessment Details Patient Name: Date of Service: KAYCE, CHISMAR 08/21/2021 10:15 A M Medical Record Number: [id number] Patient Account Number: [id number] Date of Birth/Sex: Treating RN: 1955-09-20 (66 y.o. [name] Primary Care Wataru Mccowen: [name] Other Clinician: Referring Corbin Falck: Treating Willmer Fellers/Extender: [name] in Treatment: 25 Edema Assessment Assessed: [Left: No] [Right: No] Edema: [Left: Yes] [Right: Yes] Calf Left: Right: Point of Measurement: 29 cm From Medial Instep 40.2 cm 42 cm Ankle Left:  Right: Point of Measurement: 10 cm From Medial Instep 23.5 cm 26 cm Electronic Signature(s) Signed: 08/21/2021 5:44:50 PM By: [name] RN Entered By: [name] on 08/21/2021 10:31:06 -------------------------------------------------------------------------------- Multi Wound Chart Details Patient Name: Date of Service: [name] 08/21/2021 10:15 A M Medical Record Number: [id number] Patient Account Number: [id number] Date of Birth/Sex: Treating RN: Jul 11, 1955 (66 y.o. [name] Primary Care Britanie Harshman: [name] Other Clinician: Referring Athziry Millican: Treating Ki Corbo/Extender: [name] in Treatment: 25 Vital Signs Height(in): 62 Pulse(bpm): 75 Weight(lbs): 226 Blood Pressure(mmHg): 125/77 Body Mass Index(BMI): 41.3 Temperature(F): 97.6  Respiratory Rate(breaths/min): 16 Photos: [N/A:N/A] Left, Lateral Lower Leg N/A N/A Wound Location: Blister N/A N/A Wounding Event: Venous Leg Ulcer N/A N/A Primary Etiology: Cataracts, Lymphedema, N/A N/A Comorbid History: Hypertension, Peripheral Venous Disease, Osteoarthritis 12/13/2020 N/A N/A Date Acquired: 25 N/A N/A Weeks of Treatment: Open N/A N/A Wound Status: No N/A N/A Wound Recurrence: 0.8x0.7x0.1 N/A N/A Measurements L x W x D (cm) 0.44 N/A N/A A (cm) : rea 0.044 N/A N/A Volume (cm) : 98.50% N/A N/A % Reduction in A rea: 99.20% N/A N/A % Reduction in Volume: Full Thickness Without Exposed N/A N/A Classification: Support Structures Medium N/A N/A Exudate A mount: Serosanguineous N/A N/A Exudate Type: red, brown N/A N/A Exudate Color: Distinct, outline attached N/A N/A Wound Margin: Small (1-33%) N/A N/A Granulation A mount: Red, Pink N/A N/A Granulation Quality: Large (67-100%) N/A N/A Necrotic A mount: Fat Layer (Subcutaneous Tissue): Yes N/A N/A Exposed Structures: Fascia: No Tendon: No Muscle: No Joint: No Bone: No Medium  (34-66%) N/A N/A Epithelialization: Debridement - Excisional N/A N/A Debridement: Pre-procedure Verification/Time Out 10:40 N/A N/A Taken: Lidocaine 4% Topical Solution N/A N/A Pain Control: Subcutaneous, Slough N/A N/A Tissue Debrided: Skin/Subcutaneous Tissue N/A N/A Level: 0.56 N/A N/A Debridement A (sq cm): rea Curette N/A N/A Instrument: Minimum N/A N/A Bleeding: Pressure N/A N/A Hemostasis A chieved: 0 N/A N/A Procedural Pain: 0 N/A N/A Post Procedural Pain: Procedure was tolerated well N/A N/A Debridement Treatment Response: 0.8x0.7x0.1 N/A N/A Post Debridement Measurements L x W x D (cm) 0.044 N/A N/A Post Debridement Volume: (cm) Compression Therapy N/A N/A Procedures Performed: Debridement Treatment Notes Electronic Signature(s) Signed: 08/21/2021 11:08:33 AM By: [name] MD FACS Signed: 08/21/2021 5:44:50 PM By: [name] RN Entered By: [name] on 08/21/2021 11:08:33 -------------------------------------------------------------------------------- Multi-Disciplinary Care Plan Details Patient Name: Date of Service: [name] 08/21/2021 10:15 A M Medical Record Number: [id number] Patient Account Number: [id number] Date of Birth/Sex: Treating RN: December 07, 1955 (66 y.o. [name] Primary Care Quashaun Lazalde: [name] Other Clinician: Referring Anadelia Kintz: Treating Kamel Haven/Extender: [name] in Treatment: 25 Multidisciplinary Care Plan reviewed with physician Active Inactive Venous Leg Ulcer Nursing Diagnoses: Actual venous Insuffiency (use after diagnosis is confirmed) Goals: Patient will maintain optimal edema control Date Initiated: 02/21/2021 Target Resolution Date: 09/08/2021 Goal Status: Active Patient/caregiver will verbalize understanding of disease process and disease management Date Initiated: 02/21/2021 Date Inactivated: 03/29/2021 Target Resolution Date: 03/24/2021 Goal  Status: Met Interventions: Assess peripheral edema status every visit. Compression as ordered Provide education on venous insufficiency Notes: Wound/Skin Impairment Nursing Diagnoses: Impaired tissue integrity Knowledge deficit related to ulceration/compromised skin integrity Goals: Patient/caregiver will verbalize understanding of skin care regimen Date Initiated: 02/21/2021 Target Resolution Date: 09/08/2021 Goal Status: Active Ulcer/skin breakdown will have a volume reduction of 30% by week 4 Date Initiated: 02/21/2021 Date Inactivated: 03/29/2021 Target Resolution Date: 03/24/2021 Goal Status: Unmet Unmet Reason: infection Ulcer/skin breakdown will have a volume reduction of 50% by week 8 Date Initiated: 03/29/2021 Date Inactivated: 04/19/2021 Target Resolution Date: 04/21/2021 Goal Status: Met Interventions: Assess patient/caregiver ability to obtain necessary supplies Assess patient/caregiver ability to perform ulcer/skin care regimen upon admission and as needed Assess ulceration(s) every visit Provide education on ulcer and skin care Notes: Electronic Signature(s) Signed: 08/21/2021 5:44:50 PM By: [name] RN Entered By: [name] on 08/21/2021 17:43:14 -------------------------------------------------------------------------------- Pain Assessment Details Patient Name: Date of Service: LOIDA, CALAMIA D. 08/21/2021 10:15 A M Medical Record Number: [id number] Patient Account Number: [id number] Date of Birth/Sex: Treating  RN: 1955/08/15 (66 y.o. [name] Primary Care Zineb Glade: [name] Other Clinician: Referring Berwyn Bigley: Treating Sovereign Ramiro/Extender: [name] in Treatment: 25 Active Problems Location of Pain Severity and Description of Pain Patient Has Paino No Site Locations Pain Management and Medication Current Pain Management: Electronic Signature(s) Signed: 08/21/2021 5:44:50 PM By: [name]  RN Entered By: [name] on 08/21/2021 10:30:49 -------------------------------------------------------------------------------- Patient/Caregiver Education Details Patient Name: Date of Service: [name] 7/10/2023andnbsp10:15 [place] Record Number: [id number] Patient Account Number: [id number] Date of Birth/Gender: Treating RN: 1955/03/31 (66 y.o. [name] Primary Care Physician: [name] Other Clinician: Referring Physician: Treating Physician/Extender: [name] in Treatment: 25 Education Assessment Education Provided To: Patient Education Topics Provided Wound/Skin Impairment: Methods: Explain/Verbal Responses: Return demonstration correctly Electronic Signature(s) Signed: 08/21/2021 5:44:50 PM By: [name] RN Entered By: [name] on 08/21/2021 17:43:29 -------------------------------------------------------------------------------- Wound Assessment Details Patient Name: Date of Service: EVANGELIA, WHITAKER D. 08/21/2021 10:15 A M Medical Record Number: [id number] Patient Account Number: [id number] Date of Birth/Sex: Treating RN: 04-08-1955 (66 y.o. [name] Primary Care Broc Caspers: [name] Other Clinician: Referring Dandrea Medders: Treating Joven Mom/Extender: [name] in Treatment: 25 Wound Status Wound Number: 3 Primary Venous Leg Ulcer Etiology: Wound Location: Left, Lateral Lower Leg Wound Open Wounding Event: Blister Status: Date Acquired: 12/13/2020 Comorbid Cataracts, Lymphedema, Hypertension, Peripheral Venous Weeks Of Treatment: 25 History: Disease, Osteoarthritis Clustered Wound: No Photos Wound Measurements Length: (cm) 0.8 Width: (cm) 0.7 Depth: (cm) 0.1 Area: (cm) 0.44 Volume: (cm) 0.044 % Reduction in Area: 98.5% % Reduction in Volume: 99.2% Epithelialization: Medium (34-66%) Tunneling: No Undermining: No Wound  Description Classification: Full Thickness Without Exposed Support Structures Wound Margin: Distinct, outline attached Exudate Amount: Medium Exudate Type: Serosanguineous Exudate Color: red, brown Foul Odor After Cleansing: No Slough/Fibrino Yes Wound Bed Granulation Amount: Small (1-33%) Exposed Structure Granulation Quality: Red, Pink Fascia Exposed: No Necrotic Amount: Large (67-100%) Fat Layer (Subcutaneous Tissue) Exposed: Yes Necrotic Quality: Adherent Slough Tendon Exposed: No Muscle Exposed: No Joint Exposed: No Bone Exposed: No Treatment Notes Wound #3 (Lower Leg) Wound Laterality: Left, Lateral Cleanser Soap and Water Discharge Instruction: May shower and wash wound with dial antibacterial soap and water prior to dressing change. Wound Cleanser Discharge Instruction: Cleanse the wound with wound cleanser prior to applying a clean dressing using gauze sponges, not tissue or cotton balls. Peri-Wound Care Sween Lotion (Moisturizing lotion) Discharge Instruction: Apply moisturizing lotion as directed Topical Mupirocin Ointment Discharge Instruction: Apply Mupirocin (Bactroban) thin layer to wound bed Primary Dressing KerraCel Ag Gelling Fiber Dressing, 4x5 in (silver alginate) Discharge Instruction: Apply silver alginate to wound bed as instructed Secondary Dressing Zetuvit Plus 4x8 in Discharge Instruction: Apply over primary dressing as directed. Secured With Compression Wrap CoFlex TLC XL 2-layer Compression System 4x7 (in/yd) Discharge Instruction: Apply CoFlex 2-layer compression as directed. (alt for 4 layer) Compression Stockings Add-Ons Electronic Signature(s) Signed: 08/21/2021 5:44:50 PM By: [name] RN Entered By: [name] on 08/21/2021 10:37:13 -------------------------------------------------------------------------------- Vitals Details Patient Name: Date of Service: [name] 08/21/2021 10:15 A M Medical Record Number:  [id number] Patient Account Number: [id number] Date of Birth/Sex: Treating RN: Jul 10, 1955 (66 y.o. [name] Primary Care Abdon Petrosky: [name] Other Clinician: Referring Even Budlong: Treating Destynee Stringfellow/Extender: [name] in Treatment: 25 Vital Signs Time Taken: 10:16 Temperature (F): 97.6 Height (in): 62 Pulse (bpm): 75 Weight (lbs): 226 Respiratory Rate (breaths/min): 16 Body Mass Index (BMI): 41.3  Blood Pressure (mmHg): 125/77 Reference Range: 80 - 120 mg / dl Electronic Signature(s) Signed: 08/21/2021 5:44:50 PM By: [name] RN Entered By: [name] on 08/21/2021 10:19:30

## 2021-08-21 NOTE — Progress Notes (Signed)
[name], [name] ([id number]) Visit Report for 08/21/2021 Chief Complaint Document Details Patient Name: Date of Service: [name], [name] 08/21/2021 10:15 A M Medical Record Number: [id number] Patient Account Number: [id number] Date of Birth/Sex: Treating RN: May 03, 1955 (66 y.o. [name] Primary Care Provider: [name] Other Clinician: Referring Provider: Treating Provider/Extender: [name] in Treatment: 25 Information Obtained from: Patient Chief Complaint 04/19/2021: The patient is here for ongoing follow-up regarding 2 left lower extremity wounds. Electronic Signature(s) Signed: 08/21/2021 11:08:42 AM By: [name] MD FACS Entered By: [name] on 08/21/2021 11:08:42 -------------------------------------------------------------------------------- Debridement Details Patient Name: Date of Service: Erin Face D. 08/21/2021 10:15 A M Medical Record Number: [id number] Patient Account Number: [id number] Date of Birth/Sex: Treating RN: [birth year] (66 y.o. [name] Primary Care Provider: [name] Other Clinician: Referring Provider: Treating Provider/Extender: [name] in Treatment: 25 Debridement Performed for Assessment: Wound #3 Left,Lateral Lower Leg Performed By: Physician [name], MD Debridement Type: Debridement Severity of Tissue Pre Debridement: Fat layer exposed Level of Consciousness (Pre-procedure): Awake and Alert Pre-procedure Verification/Time Out Yes - 10:40 Taken: Start Time: 10:40 Pain Control: Lidocaine 4% T opical Solution T Area Debrided (L x W): otal 0.8 (cm) x 0.7 (cm) = 0.56 (cm) Tissue and other material debrided: Non-Viable, Slough, Subcutaneous, Slough Level: Skin/Subcutaneous Tissue Debridement Description: Excisional Instrument: Curette Bleeding: Minimum Hemostasis Achieved: Pressure End Time: 10:41 Procedural Pain: 0 Post  Procedural Pain: 0 Response to Treatment: Procedure was tolerated well Level of Consciousness (Post- Awake and Alert procedure): Post Debridement Measurements of Total Wound Length: (cm) 0.8 Width: (cm) 0.7 Depth: (cm) 0.1 Volume: (cm) 0.044 Character of Wound/Ulcer Post Debridement: Improved Severity of Tissue Post Debridement: Fat layer exposed Post Procedure Diagnosis Same as Pre-procedure Electronic Signature(s) Signed: 08/21/2021 12:30:27 PM By: [name] MD FACS Signed: 08/21/2021 5:44:50 PM By: [name] RN Entered By: [name] on 08/21/2021 10:44:53 -------------------------------------------------------------------------------- HPI Details Patient Name: Date of Service: Erin Face D. 08/21/2021 10:15 A M Medical Record Number: [id number] Patient Account Number: [id number] Date of Birth/Sex: Treating RN: 1955/08/17 (66 y.o. 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 She tells [name] that she fell on the gravel driveway in July. She developed injuries on her distal lower leg which have not healed. She saw her primary physician on 11/15/2017 who noted her left shin injuries. Gave her antibiotics. At that point the wounds were almost circumferential however most were less than 1.5 cm. Weeping edema fluid was noted. She was referred here for evaluation. The patient has a history of chronic lower extremity edema. 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 She has had prior history with leg weeping fluid. She does not wear compression stockings. She is not doing anything specific  to these wound areas. 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 She also has lymphedema. Put her in 3 layer compression and silver alginate last week. She has  3 small wounds with depth just lateral to the tibia. More problematically than this she has numerous shallow areas some of which are almost canal like in shape with tightly adherent painful debris. It would be very difficult and time- consuming to go through this and attempt to individually debride all these areas.. I changed her to collagen today to see if that would help with any of the surface debris on some of these wounds. Otherwise we will not be able to put this in compression we have to have someone change the dressing. The patient is not eligible for home health 12/25/17 on evaluation today patient actually appears to be doing rather well in regard to the ulcer on her lower extremity. Fortunately there does not appear to be evidence of infection at this time. She has been tolerating the dressing changes without complication. This includes the compression wrap. The only issue she had was that the wrap was initially placed over her bunion region which actually calls her some discomfort and pain. Other than that things seem to be going rather well. 01/01/2018 Seen today for follow-up and management of left lower extremity wound and lymphedema. T oday she presents with a new wound towards to the left lateral LE. Recently treated with a 7 day course of amoxicillin; reason for antibiotic dose is unknown at this time. Tolerating current treatment of collagen with 4- layer wraps. She obtained a venous reflux study on 12/25/17. Studies show on the right abnormal reflux  times of the popliteal vein, great saphenous vein at the saphenofemoral junction at the proximal thigh, great saphenous vein at the mid calf, and origin of the small saphenous vein.No superficial thrombosis. No deep vein thrombosis in the common femoral, femoral,and popliteal veins. Left abnormal reflex times as well of the common femoral vein, popliteal vein, and a great saphenous vein at the saphenofemoral junction, In great saphenous vein at the mid thigh w/o thrombosis. Has any issues or concerns during visit today. Recommended follow-up to vascular specialist due to abnormalities from the venous reflux study. Denies fever, pain, chills, dizziness, nausea, or vomiting. 01/08/18 upon evaluation today the patient actually seems to be showing some signs of improvement in my opinion at this point in regard to the lower extremity ulcerated areas. She still has a lot of drainage but fortunately nothing that appears to be too significant currently. I have been very happy with the overall progress I see today compared to where things were during the last evaluation that I had with her. Nonetheless she has not had her appointment with the vein specialist as of yet in fact we were able to get this approved for her today and confirmed with them she will be seeing them on December 26. Nonetheless in general I do feel like the compression wraps is doing well for her. 01/17/18; quite a bit of improvement since last time I saw this patient she has a small open area remaining on the left lateral calf and even smaller area medially. She has lymphedema chronic stasis changes with distal skin fibrosis. She has an appointment with vascular surgery later this month 01/24/2018; the patient's medial leg has closed. Still a small open area on the left lateral leg. She states that the 4 layer compression we put on last week was too tight and she had to take it off over a few days ago. We have had resultant increase in her  lymphedema in the dorsal foot and a proximal calf. Fortunately that does not  seem to have resulted in any deterioration in her wounds The patient is going to need compression stockings. We have given her measurements to phone elastic therapy in [place]. She has vascular surgery consult on December 26 02/07/18; she's had continuous contraction on the left lateral leg wound which is now very small. Currently using silver alginate under 3 layer compression She saw Dr. Trula Slade of vascular surgery on 02/06/18. It was noted that she had a normal reflux times in the popliteal vein, great saphenous vein at the saphenofemoral junction, great saphenous vein at the proximal thigh great saphenous vein at the mid calf and origin of the small saphenous vein. It was noted that she had significant reflux in the left saphenous veins with diameter measurements in the 0.7-0.8 cm range. It was felt she would benefit from laser ablation to help minimize the risk of ulcer recurrence. It was recommended that she wear 20-30 thigh-high compression stockings and have follow-up in 4-6 weeks 02/17/2018; I thought this lady would be healed however her compression slipped down and she developed increasing swelling and the wound is actually larger. 1/13; we had deterioration last week after the patient's compression slipped down and she developed periwound swelling. I increased her compression before layers we have been using silver alginate we are a lot better again today. She has her compression stockings in waiting 1/23; the patient's wounds are totally healed today. She has her stockings. This was almost circumferential skin damage. She follows up with Dr. Trula Slade of vascular surgery next Monday. READMISSION 02/21/2021 This is a now 65 year old woman that we had in clinic here discharging in January 2020 with wounds on her left calf chronic venous insufficiency. She was discharged with 30/40 stockings. It does not sound like  she has worn stockings in about a year largely from not being able to get them on herself. In November she developed new blisters on her legs an area laterally is opened into a fairly sizable wound. She has weeping posteriorly as well. She has been using Neosporin and Band-Aids. The patient did see Dr. Trula Slade in 2019 and 2020. He felt she might benefit from laser ablation in her left saphenous veins although because of the wound that healed I do not think he went through with it. She might benefit from seeing him again. Her ABI on the left is 1. 1/17; patient's wound on the posterior left calf is closed she has the 2 large areas last week. We put her in 4-layer compression for edema control is a lot better. Our intake nurse noted greenish drainage and odor. We have been using silver alginate 1/25; PCR culture I did have the substantial wound area on the left lateral lower leg showed staff aureus and group A strep. Low titers of coag negative staph which are probably skin contaminants. Resistance detected to tetracycline methicillin and macrolides. I gave her a starter kit of Nuzyra 150 mg x 3 for 2 days then 300 mg for a further 7 days. Marked odor considerable increase in surrounding erythema. We are using Iodoflex last week however I have changed to silver alginate with underlying Bactroban 2/1; she is completing her [place] tomorrow. The degree of erythema around the wounds looks a lot better. Odor has improved. I gave her silver alginate and Bactroban last week and changing her back to Iodoflex to continue with ongoing debridement 2/8; the periwound looks a lot better. Surface of the wound also looks somewhat better although there is still ongoing debridement to be  done we have been using Iodoflex under compression. Primary dressing and silver alginate 2/15; left posterior calf. Some improvement in the surface of the wound but still very gritty we have been using Iodoflex under  compression. 04/05/2021: Left lateral posterior calf. She continues to have significant amounts of drainage, some of which is probably comprised of the Iodoflex microbleeds. There is no odor to the drainage. The satellite lesion on the more posterior aspect of the calf is epithelializing nicely and has contracted quite a bit. There is robust granulation tissue in the dominant wound with minimal adherent slough. 04/12/2021: The satellite lesion has nearly closed. She continues to have good granulation tissue with minimal slough of the larger primary wound. She continues to have a fair amount of drainage, but I think this is less secondary to switching her dressing from Iodoflex to Prisma. 04/19/2021: The satellite lesion is almost completely epithelialized. The larger primary wound continues to contract with good granulation tissue and minimal slough. She is currently in [place] with compression. 04/26/2021: The satellite lesion has closed. The larger primary wound has contracted further and the granulation tissue is robust without being hypertrophic. Minimal slough is present. 05/03/2021: The satellite lesion remains closed. The larger primary wound has a bit of slough, but has also contracted further with good perimeter epithelialization. Good granulation tissue at the wound surface. There was some greenish drainage on the dressing when it was removed; it is not the blue-green typically associated with Pseudomonas aeruginosa, however. 05/10/2021: The primary wound continues to contract. There is minimal slough. The granulation tissue at 9:00 is a bit hypertrophic. No significant drainage. No odor. 05/17/2021: The wound is a little bit smaller today with good granulation tissue. Minimal slough. No significant drainage or odor. 05/24/2021: The wound continues to contract and has a nice base of granulation tissue. Small amount of slough. No concern for infection. 05/31/2021: For some reason, the wound measured  slightly larger today but overall it still appears to be in good condition with a nice base of granulation tissue and minimal slough. 06/07/2021: The wound is smaller today. Good granulation tissue and minimal slough. 06/14/2021: The wound is unchanged in size. She continues to accumulate some slough. Good granulation tissue on the surface. 06/20/2021: The wound is smaller today. Minimal slough with good granulation tissue. 06/27/2021: The wound on her left lateral leg is smaller today with just a bit of slough and eschar accumulation. Unfortunately, she has opened a new superficial wound on her right lower extremity. She has 3+ pitting edema to the knees on that leg and she does not wear compression stockings. 07/04/2021: In addition to the superficial wound on her right lower extremity that she opened up last week, she has opened 3 additional sites on the same leg. Her compression wraps were clearly not in correct position when she came to clinic today as they were at about the mid calf level rather than to the tibial tuberosity. She says that they slipped earlier and she had to come back on Friday to have them redone. The wound on her left lateral leg is perhaps slightly larger with some slough accumulation. 07/11/2021: The superficial wounds on her right lower extremity are nearly closed. They just have a thin layer of eschar overlying them. The wound on her left lateral leg is a little bit shallower and has a bit of slough accumulation. 07/17/2021: The right medial lower extremity leg wounds have almost completely closed; one of them just has a tiny opening with  a little bit of serous drainage. The left lateral leg wound is really unchanged. It seems to be stalled. 07/24/2021: The right leg wounds are completely closed. The left lateral leg wound is actually bigger today. It is a bit more tender. There is a minor accumulation of slough on the surface. 07/31/2021: The culture that I took last week was  positive for MRSA. We added mupirocin under the silver alginate and I prescribed doxycycline. She has been tolerating this well. The wound is smaller today but does have slough accumulation. No significant pain or drainage. 08/07/2021: She completed her course of doxycycline. The wound looks much better today. It is smaller and the periwound is less inflamed. She does have some slough accumulation on the surface. 08/14/2021: The wound continues to contract. There is slough on the wound surface, but the periwound is intact without inflammation or induration. 08/21/2021: The wound is down to just 2 small open sites. They both have a bit of slough accumulation. Electronic Signature(s) Signed: 08/21/2021 11:09:26 AM By: [name] MD FACS Entered By: [name] on 08/21/2021 11:09:26 -------------------------------------------------------------------------------- Physical Exam Details Patient Name: Date of Service: Erin Face D. 08/21/2021 10:15 A M Medical Record Number: [id number] Patient Account Number: [id number] Date of Birth/Sex: Treating RN: January 17, 1956 (66 y.o. [name] Primary Care Provider: [name] Other Clinician: Referring Provider: Treating Provider/Extender: [name] in Treatment: 25 Constitutional . . . . No acute distress.Marland Kitchen Respiratory Normal work of breathing on room air.. Notes 08/21/2021: The wound is down to just 2 small open sites. They both have a bit of slough accumulation. Electronic Signature(s) Signed: 08/21/2021 11:09:54 AM By: [name] MD FACS Entered By: [name] on 08/21/2021 11:09:54 -------------------------------------------------------------------------------- Physician Orders Details Patient Name: Date of Service: [name] 08/21/2021 10:15 A M Medical Record Number: [id number] Patient Account Number: [id number] Date of Birth/Sex: Treating RN: 05/18/1955 (66 y.o. [name] Primary Care Provider: [name] Other Clinician: Referring Provider: Treating Provider/Extender: [name] in Treatment: 25 Verbal / Phone Orders: No Diagnosis Coding ICD-10 Coding Code Description 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 (idiopathic) with ulcer and inflammation of left lower extremity L97.828 Non-pressure chronic ulcer of other part of left lower leg with other specified severity Follow-up Appointments ppointment in 1 week. - Dr. Celine Ahr Room 3 Monday 7/17 at 8:45 am Return A Bathing/ Shower/ Hygiene May shower with protection but do not get wound dressing(s) wet. - Ok to use [employer], can purchase at [facility], [facility], or [employer] Edema Control - Lymphedema / SCD / Other Elevate legs to the level of the heart or above for 30 minutes daily and/or when sitting, a frequency of: - throughout the day Avoid standing for long periods of time. Exercise regularly Compression stocking or Garment 20-30 mm/Hg pressure to: - Brochure for Elastic Therapy. Leg measurements included Wound Treatment Wound #3 - Lower Leg Wound Laterality: Left, Lateral Cleanser: Soap and Water 1 x Per Week/30 Days Discharge Instructions: May shower and wash wound with dial antibacterial soap and water prior to dressing change. Cleanser: Wound Cleanser 1 x Per Week/30 Days Discharge Instructions: Cleanse the wound with wound cleanser prior to applying a clean dressing using gauze sponges, not tissue or cotton balls. Peri-Wound Care: Sween Lotion (Moisturizing lotion) 1 x Per Week/30 Days Discharge Instructions: Apply moisturizing lotion as directed Topical: Mupirocin Ointment 1 x Per Week/30 Days Discharge Instructions: Apply Mupirocin (Bactroban) thin layer to wound bed Prim Dressing: KerraCel Ag  Gelling Fiber Dressing, 4x5 in (silver alginate) 1 x Per Week/30 Days ary Discharge Instructions: Apply silver alginate to wound bed as instructed Secondary Dressing:  Zetuvit Plus 4x8 in 1 x Per Week/30 Days Discharge Instructions: Apply over primary dressing as directed. Compression Wrap: CoFlex TLC XL 2-layer Compression System 4x7 (in/yd) 1 x Per Week/30 Days Discharge Instructions: Apply CoFlex 2-layer compression as directed. (alt for 4 layer) Electronic Signature(s) Signed: 08/21/2021 11:11:53 AM By: [name] MD FACS Entered By: [name] on 08/21/2021 11:11:53 -------------------------------------------------------------------------------- Problem List Details Patient Name: Date of Service: Erin Face D. 08/21/2021 10:15 A M Medical Record Number: [id number] Patient Account Number: [id number] Date of Birth/Sex: Treating RN: Sep 23, 1955 (66 y.o. [name] Primary Care Provider: [name] Other Clinician: Referring Provider: Treating Provider/Extender: [name] in Treatment: 25 Active Problems ICD-10 Encounter Code Description Active Date MDM Diagnosis 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 (idiopathic) with ulcer and inflammation of left 02/21/2021 No Yes lower extremity L97.828 Non-pressure chronic ulcer of other part of left lower leg with other specified 02/21/2021 No Yes severity Inactive Problems ICD-10 Code Description Active Date Inactive Date L03.116 Cellulitis of left lower limb 03/08/2021 03/08/2021 L97.811 Non-pressure chronic ulcer of other part of right lower leg limited to breakdown of skin 06/27/2021 06/27/2021 Resolved Problems Electronic Signature(s) Signed: 08/21/2021 11:08:25 AM By: [name] MD FACS Entered By: [name] on 08/21/2021 11:08:25 -------------------------------------------------------------------------------- Progress Note Details Patient Name: Date of Service: Erin Face D. 08/21/2021 10:15 A M Medical Record Number: [id number] Patient Account Number: [id number] Date of Birth/Sex: Treating RN: Jan 31, 1956 (66 y.o. [name] Primary Care Provider: [name] Other Clinician: Referring Provider: Treating Provider/Extender: [name] in Treatment: 25 Subjective Chief Complaint Information obtained from Patient 04/19/2021: The patient is here for ongoing follow-up regarding 2 left lower extremity wounds. 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 She tells [name] that she fell on the gravel driveway in July. She developed injuries on her distal lower leg which have not healed. She saw her primary physician on 11/15/2017 who noted her left shin injuries. Gave her antibiotics. At that point the wounds were almost circumferential however most were less than 1.5 cm. Weeping edema fluid was noted. She was referred here for evaluation. The patient has a history of chronic lower extremity edema. 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 She has had prior history with leg weeping fluid. She does not wear compression stockings. She is not doing anything specific to these wound areas. 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 She also has lymphedema. Put her in 3 layer compression and silver alginate last week. She has 3 small wounds with depth just lateral to the tibia. More problematically than this she has numerous shallow areas some of which are almost canal like in shape with tightly adherent painful debris. It would be very difficult and time- consuming to go through this and attempt to individually debride all these  areas.. I changed her to collagen today to see if that would help with any of  the surface debris on some of these wounds. Otherwise we will not be able to put this in compression we have to have someone change the dressing. The patient is not eligible for home health 12/25/17 on evaluation today patient actually appears to be doing rather well in regard to the ulcer on her lower extremity. Fortunately there does not appear to be evidence of infection at this time. She has been tolerating the dressing changes without complication. This includes the compression wrap. The only issue she had was that the wrap was initially placed over her bunion region which actually calls her some discomfort and pain. Other than that things seem to be going rather well. 01/01/2018 Seen today for follow-up and management of left lower extremity wound and lymphedema. T oday she presents with a new wound towards to the left lateral LE. Recently treated with a 7 day course of amoxicillin; reason for antibiotic dose is unknown at this time. Tolerating current treatment of collagen with 4- layer wraps. She obtained a venous reflux study on 12/25/17. Studies show on the right abnormal reflux times of the popliteal vein, great saphenous vein at the saphenofemoral junction at the proximal thigh, great saphenous vein at the mid calf, and origin of the small saphenous vein.No superficial thrombosis. No deep vein thrombosis in the common femoral, femoral,and popliteal veins. Left abnormal reflex times as well of the common femoral vein, popliteal vein, and a great saphenous vein at the saphenofemoral junction, In great saphenous vein at the mid thigh w/o thrombosis. Has any issues or concerns during visit today. Recommended follow-up to vascular specialist due to abnormalities from the venous reflux study. Denies fever, pain, chills, dizziness, nausea, or vomiting. 01/08/18 upon evaluation today the patient actually seems to be  showing some signs of improvement in my opinion at this point in regard to the lower extremity ulcerated areas. She still has a lot of drainage but fortunately nothing that appears to be too significant currently. I have been very happy with the overall progress I see today compared to where things were during the last evaluation that I had with her. Nonetheless she has not had her appointment with the vein specialist as of yet in fact we were able to get this approved for her today and confirmed with them she will be seeing them on December 26. Nonetheless in general I do feel like the compression wraps is doing well for her. 01/17/18; quite a bit of improvement since last time I saw this patient she has a small open area remaining on the left lateral calf and even smaller area medially. She has lymphedema chronic stasis changes with distal skin fibrosis. She has an appointment with vascular surgery later this month 01/24/2018; the patient's medial leg has closed. Still a small open area on the left lateral leg. She states that the 4 layer compression we put on last week was too tight and she had to take it off over a few days ago. We have had resultant increase in her lymphedema in the dorsal foot and a proximal calf. Fortunately that does not seem to have resulted in any deterioration in her wounds The patient is going to need compression stockings. We have given her measurements to phone elastic therapy in [place]. She has vascular surgery consult on December 26 02/07/18; she's had continuous contraction on the left lateral leg wound which is now very small. Currently using silver alginate under 3 layer compression She saw Dr. Trula Slade of vascular surgery on  02/06/18. It was noted that she had a normal reflux times in the popliteal vein, great saphenous vein at the saphenofemoral junction, great saphenous vein at the proximal thigh great saphenous vein at the mid calf and origin of the small  saphenous vein. It was noted that she had significant reflux in the left saphenous veins with diameter measurements in the 0.7-0.8 cm range. It was felt she would benefit from laser ablation to help minimize the risk of ulcer recurrence. It was recommended that she wear 20-30 thigh-high compression stockings and have follow-up in 4-6 weeks 02/17/2018; I thought this lady would be healed however her compression slipped down and she developed increasing swelling and the wound is actually larger. 1/13; we had deterioration last week after the patient's compression slipped down and she developed periwound swelling. I increased her compression before layers we have been using silver alginate we are a lot better again today. She has her compression stockings in waiting 1/23; the patient's wounds are totally healed today. She has her stockings. This was almost circumferential skin damage. She follows up with Dr. Trula Slade of vascular surgery next Monday. READMISSION 02/21/2021 This is a now 66 year old woman that we had in clinic here discharging in January 2020 with wounds on her left calf chronic venous insufficiency. She was discharged with 30/40 stockings. It does not sound like she has worn stockings in about a year largely from not being able to get them on herself. In November she developed new blisters on her legs an area laterally is opened into a fairly sizable wound. She has weeping posteriorly as well. She has been using Neosporin and Band-Aids. The patient did see Dr. Trula Slade in 2019 and 2020. He felt she might benefit from laser ablation in her left saphenous veins although because of the wound that healed I do not think he went through with it. She might benefit from seeing him again. Her ABI on the left is 1. 1/17; patient's wound on the posterior left calf is closed she has the 2 large areas last week. We put her in 4-layer compression for edema control is a lot better. Our intake nurse noted  greenish drainage and odor. We have been using silver alginate 1/25; PCR culture I did have the substantial wound area on the left lateral lower leg showed staff aureus and group A strep. Low titers of coag negative staph which are probably skin contaminants. Resistance detected to tetracycline methicillin and macrolides. I gave her a starter kit of Nuzyra 150 mg x 3 for 2 days then 300 mg for a further 7 days. Marked odor considerable increase in surrounding erythema. We are using Iodoflex last week however I have changed to silver alginate with underlying Bactroban 2/1; she is completing her [place] tomorrow. The degree of erythema around the wounds looks a lot better. Odor has improved. I gave her silver alginate and Bactroban last week and changing her back to Iodoflex to continue with ongoing debridement 2/8; the periwound looks a lot better. Surface of the wound also looks somewhat better although there is still ongoing debridement to be done we have been using Iodoflex under compression. Primary dressing and silver alginate 2/15; left posterior calf. Some improvement in the surface of the wound but still very gritty we have been using Iodoflex under compression. 04/05/2021: Left lateral posterior calf. She continues to have significant amounts of drainage, some of which is probably comprised of the Iodoflex microbleeds. There is no odor to the drainage. The satellite  lesion on the more posterior aspect of the calf is epithelializing nicely and has contracted quite a bit. There is robust granulation tissue in the dominant wound with minimal adherent slough. 04/12/2021: The satellite lesion has nearly closed. She continues to have good granulation tissue with minimal slough of the larger primary wound. She continues to have a fair amount of drainage, but I think this is less secondary to switching her dressing from Iodoflex to Prisma. 04/19/2021: The satellite lesion is almost completely  epithelialized. The larger primary wound continues to contract with good granulation tissue and minimal slough. She is currently in [place] with compression. 04/26/2021: The satellite lesion has closed. The larger primary wound has contracted further and the granulation tissue is robust without being hypertrophic. Minimal slough is present. 05/03/2021: The satellite lesion remains closed. The larger primary wound has a bit of slough, but has also contracted further with good perimeter epithelialization. Good granulation tissue at the wound surface. There was some greenish drainage on the dressing when it was removed; it is not the blue-green typically associated with Pseudomonas aeruginosa, however. 05/10/2021: The primary wound continues to contract. There is minimal slough. The granulation tissue at 9:00 is a bit hypertrophic. No significant drainage. No odor. 05/17/2021: The wound is a little bit smaller today with good granulation tissue. Minimal slough. No significant drainage or odor. 05/24/2021: The wound continues to contract and has a nice base of granulation tissue. Small amount of slough. No concern for infection. 05/31/2021: For some reason, the wound measured slightly larger today but overall it still appears to be in good condition with a nice base of granulation tissue and minimal slough. 06/07/2021: The wound is smaller today. Good granulation tissue and minimal slough. 06/14/2021: The wound is unchanged in size. She continues to accumulate some slough. Good granulation tissue on the surface. 06/20/2021: The wound is smaller today. Minimal slough with good granulation tissue. 06/27/2021: The wound on her left lateral leg is smaller today with just a bit of slough and eschar accumulation. Unfortunately, she has opened a new superficial wound on her right lower extremity. She has 3+ pitting edema to the knees on that leg and she does not wear compression stockings. 07/04/2021: In addition to the  superficial wound on her right lower extremity that she opened up last week, she has opened 3 additional sites on the same leg. Her compression wraps were clearly not in correct position when she came to clinic today as they were at about the mid calf level rather than to the tibial tuberosity. She says that they slipped earlier and she had to come back on Friday to have them redone. The wound on her left lateral leg is perhaps slightly larger with some slough accumulation. 07/11/2021: The superficial wounds on her right lower extremity are nearly closed. They just have a thin layer of eschar overlying them. The wound on her left lateral leg is a little bit shallower and has a bit of slough accumulation. 07/17/2021: The right medial lower extremity leg wounds have almost completely closed; one of them just has a tiny opening with a little bit of serous drainage. The left lateral leg wound is really unchanged. It seems to be stalled. 07/24/2021: The right leg wounds are completely closed. The left lateral leg wound is actually bigger today. It is a bit more tender. There is a minor accumulation of slough on the surface. 07/31/2021: The culture that I took last week was positive for MRSA. We added mupirocin under  the silver alginate and I prescribed doxycycline. She has been tolerating this well. The wound is smaller today but does have slough accumulation. No significant pain or drainage. 08/07/2021: She completed her course of doxycycline. The wound looks much better today. It is smaller and the periwound is less inflamed. She does have some slough accumulation on the surface. 08/14/2021: The wound continues to contract. There is slough on the wound surface, but the periwound is intact without inflammation or induration. 08/21/2021: The wound is down to just 2 small open sites. They both have a bit of slough accumulation. Patient History Information obtained from Patient. Family History Heart Disease -  Mother,Father, Hypertension - Mother,Father, Kidney Disease - Mother, Thyroid Problems - Mother, No family history of Cancer, Diabetes, Hereditary Spherocytosis, Lung Disease, Seizures, Stroke, Tuberculosis. Social History Never smoker, Marital Status - Single, Alcohol Use - Never, Drug Use - No History, Caffeine Use - Daily - coffee. Medical History Eyes Patient has history of Cataracts Hematologic/Lymphatic Patient has history of Lymphedema Cardiovascular Patient has history of Hypertension, Peripheral Venous Disease Integumentary (Skin) Denies history of History of Burn Musculoskeletal Patient has history of Osteoarthritis Hospitalization/Surgery History - colostomy reversal. 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 Medical A Surgical History Notes nd Constitutional Symptoms (General Health) morbid obesity Cardiovascular schamberg disease, hyperlipidemia Gastrointestinal diverticulitis , h/o obstruction due to diverticulitis , colostomy and colostomy reversal Endocrine hypothyroidism Objective Constitutional No acute distress.. Vitals Time Taken: 10:16 AM, Height: 62 in, Weight: 226 lbs, BMI: 41.3, Temperature: 97.6 F, Pulse: 75 bpm, Respiratory Rate: 16 breaths/min, Blood Pressure: 125/77 mmHg. Respiratory Normal work of breathing on room air.. General Notes: 08/21/2021: The wound is down to just 2 small open sites. They both have a bit of slough accumulation. Integumentary (Hair, Skin) Wound #3 status is Open. Original cause of wound was Blister. The date acquired was: 12/13/2020. The wound has been in treatment 25 weeks. The wound is located on the Left,Lateral Lower Leg. The wound measures 0.8cm length x 0.7cm width x 0.1cm depth; 0.44cm^2 area and 0.044cm^3 volume. There is Fat Layer (Subcutaneous Tissue) exposed. There is no tunneling or undermining noted. There is a medium amount of serosanguineous drainage noted. The wound margin is distinct with the outline attached to the  wound base. There is small (1-33%) red, pink granulation within the wound bed. There is a large (67-100%) amount of necrotic tissue within the wound bed including Adherent Slough. 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 There was a Excisional Skin/Subcutaneous Tissue Debridement with a total area of 0.56 sq cm performed by [name], MD. With the following instrument(s): Curette to remove Non-Viable tissue/material. Material removed includes Subcutaneous Tissue and Slough and after achieving pain control using Lidocaine 4% Topical Solution. No specimens were taken. A time out was conducted at 10:40, prior to the start of the procedure. A Minimum amount of bleeding was controlled with Pressure. The procedure was tolerated well with a pain level of 0 throughout and a pain level of 0 following the procedure. Post Debridement Measurements: 0.8cm length x 0.7cm width x 0.1cm depth; 0.044cm^3 volume. Character of Wound/Ulcer Post Debridement is improved. Severity of Tissue Post Debridement is: Fat layer exposed. Post procedure Diagnosis Wound #  3: Same as Pre-Procedure Pre-procedure diagnosis of Wound #3 is a Venous Leg Ulcer located on the Left,Lateral Lower Leg . There was a Double Layer Compression Therapy Procedure by [name], RN. Post procedure Diagnosis Wound #3: Same as Pre-Procedure Plan Follow-up Appointments: Return Appointment in 1 week. - Dr. Celine Ahr Room 3 Monday 7/17 at 8:45 am Bathing/ Shower/ Hygiene: May shower with protection but do not get wound dressing(s) wet. - Ok to use Market [employer], can purchase at [facility], [facility],  or [employer] Edema Control - Lymphedema / SCD / Other: Elevate legs to the level of the heart or above for 30 minutes daily and/or when sitting, a frequency of: - throughout the day Avoid standing for long periods of time. Exercise regularly Compression stocking or Garment 20-30 mm/Hg pressure to: - Brochure for Elastic Therapy. Leg measurements included WOUND #3: - Lower Leg Wound Laterality: Left, Lateral Cleanser: Soap and Water 1 x Per Week/30 Days Discharge Instructions: May shower and wash wound with dial antibacterial soap and water prior to dressing change. Cleanser: Wound Cleanser 1 x Per Week/30 Days Discharge Instructions: Cleanse the wound with wound cleanser prior to applying a clean dressing using gauze sponges, not tissue or cotton balls. Peri-Wound Care: Sween Lotion (Moisturizing lotion) 1 x Per Week/30 Days Discharge Instructions: Apply moisturizing lotion as directed Topical: Mupirocin Ointment 1 x Per Week/30 Days Discharge Instructions: Apply Mupirocin (Bactroban) thin layer to wound bed Prim Dressing: KerraCel Ag Gelling Fiber Dressing, 4x5 in (silver alginate) 1 x Per Week/30 Days ary Discharge Instructions: Apply silver alginate to wound bed as instructed Secondary Dressing: Zetuvit Plus 4x8 in 1 x Per Week/30 Days Discharge Instructions: Apply over primary dressing as directed. Com pression Wrap: CoFlex TLC XL 2-layer Compression System 4x7 (in/yd) 1 x Per Week/30 Days Discharge Instructions: Apply CoFlex 2-layer compression as directed. (alt for 4 layer) 08/21/2021: The wound is down to just 2 small open sites. They both have a bit of slough accumulation. I used a curette to debride slough and nonviable subcutaneous tissue from the wound. We will continue using mupirocin, silver alginate, and Coflex compression. Follow-up in 1 week. Electronic Signature(s) Signed: 08/21/2021 11:12:38 AM By: [name] MD FACS Entered By: [name] on 08/21/2021  11:12:37 -------------------------------------------------------------------------------- HxROS Details Patient Name: Date of Service: [name] 08/21/2021 10:15 A M Medical Record Number: [id number] Patient Account Number: [id number] Date of Birth/Sex: Treating RN: 28-Sep-1955 (66 y.o. [name] Primary Care Provider: [name] Other Clinician: Referring Provider: Treating Provider/Extender: [name] in Treatment: 25 Information Obtained From Patient Constitutional Symptoms (General Health) Medical History: Past Medical History Notes: morbid obesity Eyes Medical History: Positive for: Cataracts Hematologic/Lymphatic Medical History: Positive for: Lymphedema Cardiovascular Medical History: Positive for: Hypertension; Peripheral Venous Disease Past Medical History Notes: schamberg disease, hyperlipidemia Gastrointestinal Medical History: Past Medical History Notes: diverticulitis , h/o obstruction due to diverticulitis , colostomy and colostomy reversal Endocrine Medical History: Past Medical History Notes: hypothyroidism Integumentary (Skin) Medical History: Negative for: History of Burn Musculoskeletal Medical History: Positive for: Osteoarthritis HBO Extended History Items Eyes: Cataracts Immunizations Pneumococcal Vaccine: Received Pneumococcal Vaccination: Yes Received Pneumococcal Vaccination On or After 60th Birthday: Yes Implantable Devices None Hospitalization / Surgery History Type of Hospitalization/Surgery colostomy reversal 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 Family and Social History Cancer: No; Diabetes: No; Heart Disease: Yes - Mother,Father; Hereditary Spherocytosis: No; Hypertension: Yes - Mother,Father; Kidney Disease: Yes - Mother; Lung Disease: No; Seizures: No; Stroke: No; Thyroid Problems: Yes - Mother;  Tuberculosis: No; Never smoker; Marital Status - Single; Alcohol Use: Never; Drug Use: No  History; Caffeine Use: Daily - coffee; Financial Concerns: No; Food, Clothing or Shelter Needs: No; Support System Lacking: No; Transportation Concerns: No Electronic Signature(s) Signed: 08/21/2021 12:30:27 PM By: [name] MD FACS Signed: 08/21/2021 5:44:50 PM By: [name] RN Entered By: [name] on 08/21/2021 11:09:33 -------------------------------------------------------------------------------- SuperBill Details Patient Name: Date of Service: Erin Face D. 08/21/2021 Medical Record Number: [id number] Patient Account Number: [id number] Date of Birth/Sex: Treating RN: May 05, 1955 (66 y.o. [name] Primary Care Provider: [name] Other Clinician: Referring Provider: Treating Provider/Extender: [name] in Treatment: 25 Diagnosis Coding ICD-10 Codes Code Description [phone] Chronic venous hypertension (idiopathic) with ulcer and inflammation of left lower extremity L97.828 Non-pressure chronic ulcer of other part of left lower leg with other specified severity Facility Procedures CPT4 Code: [id number] Description: 70110 - DEB SUBQ TISSUE 20 SQ CM/< ICD-10 Diagnosis Description L97.828 Non-pressure chronic ulcer of other part of left lower leg with other specified Modifier: severity Quantity: 1 Physician Procedures : CPT4 Code Description Modifier [id number] - WC PHYS LEVEL 3 - EST PT 25 ICD-10 Diagnosis Description 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 (idiopathic) with ulcer and inflammation of left lower extremity L97.828 Non-pressure chronic ulcer of other  part of left lower leg with other specified severity Quantity: 1 : [id number] - WC PHYS SUBQ TISS 20 SQ CM ICD-10 Diagnosis Description L97.828 Non-pressure chronic ulcer of other part of left lower leg with other specified severity Quantity: 1 Electronic Signature(s) Signed: 08/21/2021 11:12:52 AM By: [name] MD FACS Entered By: [name] on 08/21/2021 11:12:52

## 2021-08-28 ENCOUNTER — Encounter (HOSPITAL_BASED_OUTPATIENT_CLINIC_OR_DEPARTMENT_OTHER): Payer: 59 | Admitting: General Surgery

## 2021-08-28 DIAGNOSIS — L97822 Non-pressure chronic ulcer of other part of left lower leg with fat layer exposed: Secondary | ICD-10-CM | POA: Diagnosis not present

## 2021-08-28 DIAGNOSIS — L97828 Non-pressure chronic ulcer of other part of left lower leg with other specified severity: Secondary | ICD-10-CM | POA: Diagnosis not present

## 2021-08-28 DIAGNOSIS — I87332 Chronic venous hypertension (idiopathic) with ulcer and inflammation of left lower extremity: Secondary | ICD-10-CM | POA: Diagnosis not present

## 2021-08-28 DIAGNOSIS — I872 Venous insufficiency (chronic) (peripheral): Secondary | ICD-10-CM | POA: Diagnosis not present

## 2021-08-28 NOTE — Progress Notes (Addendum)
CYNTHEA, ZACHMAN ([id number]) Visit Report for 08/28/2021 Chief Complaint Document Details Patient Name: Date of Service: [name], [name] 08/28/2021 8:45 A M Medical Record Number: [id number] Patient Account Number: [id number] Date of Birth/Sex: Treating RN: 22-Aug-1955 (66 y.o. [name] Primary Care Provider: [name] Other Clinician: Referring Provider: Treating Provider/Extender: [name] in Treatment: 26 Information Obtained from: Patient Chief Complaint 04/19/2021: The patient is here for ongoing follow-up regarding 2 left lower extremity wounds. Electronic Signature(s) Signed: 08/28/2021 9:43:06 AM By: [name] MD FACS Entered By: [name] on 08/28/2021 09:43:06 -------------------------------------------------------------------------------- Debridement Details Patient Name: Date of Service: Erin Gerold [name] 08/28/2021 8:45 A M Medical Record Number: [id number] Patient Account Number: [id number] Date of Birth/Sex: Treating RN: 08-15-1955 (66 y.o. [name] Primary Care Provider: [name] Other Clinician: Referring Provider: Treating Provider/Extender: [name] in Treatment: 26 Debridement Performed for Assessment: Wound #3 Left,Lateral Lower Leg Performed By: Physician [name], MD Debridement Type: Debridement Severity of Tissue Pre Debridement: Fat layer exposed Level of Consciousness (Pre-procedure): Awake and Alert Pre-procedure Verification/Time Out Yes - 09:36 Taken: Start Time: 09:36 Pain Control: Lidocaine 5% topical ointment T Area Debrided (L x W): otal 0.3 (cm) x 0.4 (cm) = 0.12 (cm) Tissue and other material debrided: Non-Viable, Eschar, Slough, Slough Level: Non-Viable Tissue Debridement Description: Selective/Open Wound Instrument: Curette Bleeding: Minimum Hemostasis Achieved: Pressure End Time: 09:37 Procedural Pain: 0 Post Procedural  Pain: 0 Response to Treatment: Procedure was tolerated well Level of Consciousness (Post- Awake and Alert procedure): Post Debridement Measurements of Total Wound Length: (cm) 0.3 Width: (cm) 0.4 Depth: (cm) 0.1 Volume: (cm) 0.009 Character of Wound/Ulcer Post Debridement: Improved Severity of Tissue Post Debridement: Fat layer exposed Post Procedure Diagnosis Same as Pre-procedure Electronic Signature(s) Signed: 08/28/2021 9:51:33 AM By: [name] MD FACS Signed: 08/28/2021 10:09:05 AM By: [name] RN Entered By: [name] on 08/28/2021 09:41:11 -------------------------------------------------------------------------------- HPI Details Patient Name: Date of Service: Erin Face D. 08/28/2021 8:45 A M Medical Record Number: [id number] Patient Account Number: [id number] Date of Birth/Sex: Treating RN: 01/19/1956 (66 y.o. 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 She tells [name] that she fell on the gravel driveway in July. She developed injuries on her distal lower leg which have not healed. She saw her primary physician on 11/15/2017 who noted her left shin injuries. Gave her antibiotics. At that point the wounds were almost circumferential however most were less than 1.5 cm. Weeping edema fluid was noted. She was referred here for evaluation. The patient has a history of chronic lower extremity edema. 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 She has had prior history with leg weeping fluid. She does not wear compression stockings. She is not doing anything specific to these  wound areas. 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 She also has lymphedema. Put her in 3 layer compression and silver alginate last week. She has  3 small wounds with depth just lateral to the tibia. More problematically than this she has numerous shallow areas some of which are almost canal like in shape with tightly adherent painful debris. It would be very difficult and time- consuming to go through this and attempt to individually debride all these areas.. I changed her to collagen today to see if that would help with any of the surface debris on some of these wounds. Otherwise we will not be able to put this in compression we have to have someone change the dressing. The patient is not eligible for home health 12/25/17 on evaluation today patient actually appears to be doing rather well in regard to the ulcer on her lower extremity. Fortunately there does not appear to be evidence of infection at this time. She has been tolerating the dressing changes without complication. This includes the compression wrap. The only issue she had was that the wrap was initially placed over her bunion region which actually calls her some discomfort and pain. Other than that things seem to be going rather well. 01/01/2018 Seen today for follow-up and management of left lower extremity wound and lymphedema. T oday she presents with a new wound towards to the left lateral LE. Recently treated with a 7 day course of amoxicillin; reason for antibiotic dose is unknown at this time. Tolerating current treatment of collagen with 4- layer wraps. She obtained a venous reflux study on 12/25/17. Studies show on the right abnormal reflux times of  the popliteal vein, great saphenous vein at the saphenofemoral junction at the proximal thigh, great saphenous vein at the mid calf, and origin of the small saphenous vein.No superficial thrombosis. No deep vein thrombosis in the common femoral, femoral,and popliteal veins. Left abnormal reflex times as well of the common femoral vein, popliteal vein, and a great saphenous vein at the saphenofemoral junction, In great saphenous vein at the mid thigh w/o thrombosis. Has any issues or concerns during visit today. Recommended follow-up to vascular specialist due to abnormalities from the venous reflux study. Denies fever, pain, chills, dizziness, nausea, or vomiting. 01/08/18 upon evaluation today the patient actually seems to be showing some signs of improvement in my opinion at this point in regard to the lower extremity ulcerated areas. She still has a lot of drainage but fortunately nothing that appears to be too significant currently. I have been very happy with the overall progress I see today compared to where things were during the last evaluation that I had with her. Nonetheless she has not had her appointment with the vein specialist as of yet in fact we were able to get this approved for her today and confirmed with them she will be seeing them on December 26. Nonetheless in general I do feel like the compression wraps is doing well for her. 01/17/18; quite a bit of improvement since last time I saw this patient she has a small open area remaining on the left lateral calf and even smaller area medially. She has lymphedema chronic stasis changes with distal skin fibrosis. She has an appointment with vascular surgery later this month 01/24/2018; the patient's medial leg has closed. Still a small open area on the left lateral leg. She states that the 4 layer compression we put on last week was too tight and she had to take it off over a few days ago. We have had resultant increase in her lymphedema  in the dorsal foot and a proximal calf. Fortunately that does not  seem to have resulted in any deterioration in her wounds The patient is going to need compression stockings. We have given her measurements to phone elastic therapy in [place]. She has vascular surgery consult on December 26 02/07/18; she's had continuous contraction on the left lateral leg wound which is now very small. Currently using silver alginate under 3 layer compression She saw Dr. Trula Slade of vascular surgery on 02/06/18. It was noted that she had a normal reflux times in the popliteal vein, great saphenous vein at the saphenofemoral junction, great saphenous vein at the proximal thigh great saphenous vein at the mid calf and origin of the small saphenous vein. It was noted that she had significant reflux in the left saphenous veins with diameter measurements in the 0.7-0.8 cm range. It was felt she would benefit from laser ablation to help minimize the risk of ulcer recurrence. It was recommended that she wear 20-30 thigh-high compression stockings and have follow-up in 4-6 weeks 02/17/2018; I thought this lady would be healed however her compression slipped down and she developed increasing swelling and the wound is actually larger. 1/13; we had deterioration last week after the patient's compression slipped down and she developed periwound swelling. I increased her compression before layers we have been using silver alginate we are a lot better again today. She has her compression stockings in waiting 1/23; the patient's wounds are totally healed today. She has her stockings. This was almost circumferential skin damage. She follows up with Dr. Trula Slade of vascular surgery next Monday. READMISSION 02/21/2021 This is a now 66 year old woman that we had in clinic here discharging in January 2020 with wounds on her left calf chronic venous insufficiency. She was discharged with 30/40 stockings. It does not sound like she has worn  stockings in about a year largely from not being able to get them on herself. In November she developed new blisters on her legs an area laterally is opened into a fairly sizable wound. She has weeping posteriorly as well. She has been using Neosporin and Band-Aids. The patient did see Dr. Trula Slade in 2019 and 2020. He felt she might benefit from laser ablation in her left saphenous veins although because of the wound that healed I do not think he went through with it. She might benefit from seeing him again. Her ABI on the left is 1. 1/17; patient's wound on the posterior left calf is closed she has the 2 large areas last week. We put her in 4-layer compression for edema control is a lot better. Our intake nurse noted greenish drainage and odor. We have been using silver alginate 1/25; PCR culture I did have the substantial wound area on the left lateral lower leg showed staff aureus and group A strep. Low titers of coag negative staph which are probably skin contaminants. Resistance detected to tetracycline methicillin and macrolides. I gave her a starter kit of Nuzyra 150 mg x 3 for 2 days then 300 mg for a further 7 days. Marked odor considerable increase in surrounding erythema. We are using Iodoflex last week however I have changed to silver alginate with underlying Bactroban 2/1; she is completing her [place] tomorrow. The degree of erythema around the wounds looks a lot better. Odor has improved. I gave her silver alginate and Bactroban last week and changing her back to Iodoflex to continue with ongoing debridement 2/8; the periwound looks a lot better. Surface of the wound also looks somewhat better although there is still ongoing debridement to be  done we have been using Iodoflex under compression. Primary dressing and silver alginate 2/15; left posterior calf. Some improvement in the surface of the wound but still very gritty we have been using Iodoflex under compression. 04/05/2021: Left  lateral posterior calf. She continues to have significant amounts of drainage, some of which is probably comprised of the Iodoflex microbleeds. There is no odor to the drainage. The satellite lesion on the more posterior aspect of the calf is epithelializing nicely and has contracted quite a bit. There is robust granulation tissue in the dominant wound with minimal adherent slough. 04/12/2021: The satellite lesion has nearly closed. She continues to have good granulation tissue with minimal slough of the larger primary wound. She continues to have a fair amount of drainage, but I think this is less secondary to switching her dressing from Iodoflex to Prisma. 04/19/2021: The satellite lesion is almost completely epithelialized. The larger primary wound continues to contract with good granulation tissue and minimal slough. She is currently in [place] with compression. 04/26/2021: The satellite lesion has closed. The larger primary wound has contracted further and the granulation tissue is robust without being hypertrophic. Minimal slough is present. 05/03/2021: The satellite lesion remains closed. The larger primary wound has a bit of slough, but has also contracted further with good perimeter epithelialization. Good granulation tissue at the wound surface. There was some greenish drainage on the dressing when it was removed; it is not the blue-green typically associated with Pseudomonas aeruginosa, however. 05/10/2021: The primary wound continues to contract. There is minimal slough. The granulation tissue at 9:00 is a bit hypertrophic. No significant drainage. No odor. 05/17/2021: The wound is a little bit smaller today with good granulation tissue. Minimal slough. No significant drainage or odor. 05/24/2021: The wound continues to contract and has a nice base of granulation tissue. Small amount of slough. No concern for infection. 05/31/2021: For some reason, the wound measured slightly larger today but overall  it still appears to be in good condition with a nice base of granulation tissue and minimal slough. 06/07/2021: The wound is smaller today. Good granulation tissue and minimal slough. 06/14/2021: The wound is unchanged in size. She continues to accumulate some slough. Good granulation tissue on the surface. 06/20/2021: The wound is smaller today. Minimal slough with good granulation tissue. 06/27/2021: The wound on her left lateral leg is smaller today with just a bit of slough and eschar accumulation. Unfortunately, she has opened a new superficial wound on her right lower extremity. She has 3+ pitting edema to the knees on that leg and she does not wear compression stockings. 07/04/2021: In addition to the superficial wound on her right lower extremity that she opened up last week, she has opened 3 additional sites on the same leg. Her compression wraps were clearly not in correct position when she came to clinic today as they were at about the mid calf level rather than to the tibial tuberosity. She says that they slipped earlier and she had to come back on Friday to have them redone. The wound on her left lateral leg is perhaps slightly larger with some slough accumulation. 07/11/2021: The superficial wounds on her right lower extremity are nearly closed. They just have a thin layer of eschar overlying them. The wound on her left lateral leg is a little bit shallower and has a bit of slough accumulation. 07/17/2021: The right medial lower extremity leg wounds have almost completely closed; one of them just has a tiny opening with  a little bit of serous drainage. The left lateral leg wound is really unchanged. It seems to be stalled. 07/24/2021: The right leg wounds are completely closed. The left lateral leg wound is actually bigger today. It is a bit more tender. There is a minor accumulation of slough on the surface. 07/31/2021: The culture that I took last week was positive for MRSA. We added mupirocin  under the silver alginate and I prescribed doxycycline. She has been tolerating this well. The wound is smaller today but does have slough accumulation. No significant pain or drainage. 08/07/2021: She completed her course of doxycycline. The wound looks much better today. It is smaller and the periwound is less inflamed. She does have some slough accumulation on the surface. 08/14/2021: The wound continues to contract. There is slough on the wound surface, but the periwound is intact without inflammation or induration. 08/21/2021: The wound is down to just 2 small open sites. They both have a bit of slough accumulation. 08/28/2021: The wound is down to just 1 small open site with a little bit of slough and eschar accumulation. Electronic Signature(s) Signed: 08/28/2021 9:43:34 AM By: [name] MD FACS Entered By: [name] on 08/28/2021 09:43:34 -------------------------------------------------------------------------------- Physical Exam Details Patient Name: Date of Service: Erin Gerold [name] 08/28/2021 8:45 A M Medical Record Number: [id number] Patient Account Number: [id number] Date of Birth/Sex: Treating RN: 06-Nov-1955 (66 y.o. [name] Primary Care Provider: [name] Other Clinician: Referring Provider: Treating Provider/Extender: [name] in Treatment: 26 Constitutional . . . . No acute distress.Marland Kitchen Respiratory Normal work of breathing on room air.. Notes 08/28/2021: The wound is down to just 1 small open site with a little bit of slough and eschar accumulation. Electronic Signature(s) Signed: 08/28/2021 9:44:02 AM By: [name] MD FACS Entered By: [name] on 08/28/2021 09:44:02 -------------------------------------------------------------------------------- Physician Orders Details Patient Name: Date of Service: Erin Gerold [name] 08/28/2021 8:45 A M Medical Record Number: [id number] Patient Account Number:  [id number] Date of Birth/Sex: Treating RN: 10/22/1955 (66 y.o. [name] Primary Care Provider: [name] Other Clinician: Referring Provider: Treating Provider/Extender: [name] in Treatment: 26 Verbal / Phone Orders: No Diagnosis Coding ICD-10 Coding Code Description 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 (idiopathic) with ulcer and inflammation of left lower extremity L97.828 Non-pressure chronic ulcer of other part of left lower leg with other specified severity Follow-up Appointments ppointment in 1 week. - Dr. Celine Ahr Room 3 Monday 7/24 at 1:15pm Return A Bathing/ Shower/ Hygiene May shower with protection but do not get wound dressing(s) wet. - Ok to use [employer], can purchase at [facility], [facility], or [employer] Edema Control - Lymphedema / SCD / Other Elevate legs to the level of the heart or above for 30 minutes daily and/or when sitting, a frequency of: - throughout the day Avoid standing for long periods of time. Exercise regularly Compression stocking or Garment 20-30 mm/Hg pressure to: - Brochure for Elastic Therapy. Leg measurements included Wound Treatment Wound #3 - Lower Leg Wound Laterality: Left, Lateral Cleanser: Soap and Water 1 x Per Week/30 Days Discharge Instructions: May shower and wash wound with dial antibacterial soap and water prior to dressing change. Cleanser: Wound Cleanser 1 x Per Week/30 Days Discharge Instructions: Cleanse the wound with wound cleanser prior to applying a clean dressing using gauze sponges, not tissue or cotton balls. Peri-Wound Care: Sween Lotion (Moisturizing lotion) 1 x Per Week/30 Days Discharge Instructions: Apply moisturizing lotion as directed Topical: Mupirocin  Ointment 1 x Per Week/30 Days Discharge Instructions: Apply Mupirocin (Bactroban) thin layer to wound bed Prim Dressing: KerraCel Ag Gelling Fiber Dressing, 4x5 in (silver alginate) 1 x Per Week/30 Days ary Discharge  Instructions: Apply silver alginate to wound bed as instructed Secondary Dressing: Zetuvit Plus 4x8 in 1 x Per Week/30 Days Discharge Instructions: Apply over primary dressing as directed. Compression Wrap: CoFlex TLC XL 2-layer Compression System 4x7 (in/yd) 1 x Per Week/30 Days Discharge Instructions: Apply CoFlex 2-layer compression as directed. (alt for 4 layer) Electronic Signature(s) Signed: 08/28/2021 9:44:13 AM By: [name] MD FACS Entered By: [name] on 08/28/2021 09:44:12 -------------------------------------------------------------------------------- Problem List Details Patient Name: Date of Service: Erin Gerold [name] 08/28/2021 8:45 A M Medical Record Number: [id number] Patient Account Number: [id number] Date of Birth/Sex: Treating RN: 08/21/1955 (66 y.o. [name] Primary Care Provider: [name] Other Clinician: Referring Provider: Treating Provider/Extender: [name] in Treatment: 26 Active Problems ICD-10 Encounter Code Description Active Date MDM Diagnosis 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 (idiopathic) with ulcer and inflammation of left 02/21/2021 No Yes lower extremity L97.828 Non-pressure chronic ulcer of other part of left lower leg with other specified 02/21/2021 No Yes severity Inactive Problems ICD-10 Code Description Active Date Inactive Date L03.116 Cellulitis of left lower limb 03/08/2021 03/08/2021 L97.811 Non-pressure chronic ulcer of other part of right lower leg limited to breakdown of skin 06/27/2021 06/27/2021 Resolved Problems Electronic Signature(s) Signed: 08/28/2021 9:42:51 AM By: [name] MD FACS Previous Signature: 08/28/2021 9:39:49 AM Version By: [name] MD FACS Entered By: [name] on 08/28/2021 09:42:51 -------------------------------------------------------------------------------- Progress Note Details Patient Name: Date of Service: Erin Face  D. 08/28/2021 8:45 A M Medical Record Number: [id number] Patient Account Number: [id number] Date of Birth/Sex: Treating RN: 1955-02-22 (66 y.o. [name] Primary Care Provider: [name] Other Clinician: Referring Provider: Treating Provider/Extender: [name] in Treatment: 26 Subjective Chief Complaint Information obtained from Patient 04/19/2021: The patient is here for ongoing follow-up regarding 2 left lower extremity wounds. 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 She tells [name] that she fell on the gravel driveway in July. She developed injuries on her distal lower leg which have not healed. She saw her primary physician on 11/15/2017 who noted her left shin injuries. Gave her antibiotics. At that point the wounds were almost circumferential however most were less than 1.5 cm. Weeping edema fluid was noted. She was referred here for evaluation. The patient has a history of chronic lower extremity edema. 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 She has had prior history with leg weeping fluid. She does not wear compression stockings. She is not doing anything specific to these wound areas. 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 She also has lymphedema. Put her in 3 layer compression and silver alginate last week. She has 3 small wounds with depth just lateral to the tibia. More problematically than this she has numerous shallow areas some of which are almost canal like in  shape with tightly adherent painful debris. It would be very  difficult and time- consuming to go through this and attempt to individually debride all these areas.. I changed her to collagen today to see if that would help with any of the surface debris on some of these wounds. Otherwise we will not be able to put this in compression we have to have someone change the dressing. The patient is not eligible for home health 12/25/17 on evaluation today patient actually appears to be doing rather well in regard to the ulcer on her lower extremity. Fortunately there does not appear to be evidence of infection at this time. She has been tolerating the dressing changes without complication. This includes the compression wrap. The only issue she had was that the wrap was initially placed over her bunion region which actually calls her some discomfort and pain. Other than that things seem to be going rather well. 01/01/2018 Seen today for follow-up and management of left lower extremity wound and lymphedema. T oday she presents with a new wound towards to the left lateral LE. Recently treated with a 7 day course of amoxicillin; reason for antibiotic dose is unknown at this time. Tolerating current treatment of collagen with 4- layer wraps. She obtained a venous reflux study on 12/25/17. Studies show on the right abnormal reflux times of the popliteal vein, great saphenous vein at the saphenofemoral junction at the proximal thigh, great saphenous vein at the mid calf, and origin of the small saphenous vein.No superficial thrombosis. No deep vein thrombosis in the common femoral, femoral,and popliteal veins. Left abnormal reflex times as well of the common femoral vein, popliteal vein, and a great saphenous vein at the saphenofemoral junction, In great saphenous vein at the mid thigh w/o thrombosis. Has any issues or concerns during visit today. Recommended follow-up to vascular specialist due to abnormalities  from the venous reflux study. Denies fever, pain, chills, dizziness, nausea, or vomiting. 01/08/18 upon evaluation today the patient actually seems to be showing some signs of improvement in my opinion at this point in regard to the lower extremity ulcerated areas. She still has a lot of drainage but fortunately nothing that appears to be too significant currently. I have been very happy with the overall progress I see today compared to where things were during the last evaluation that I had with her. Nonetheless she has not had her appointment with the vein specialist as of yet in fact we were able to get this approved for her today and confirmed with them she will be seeing them on December 26. Nonetheless in general I do feel like the compression wraps is doing well for her. 01/17/18; quite a bit of improvement since last time I saw this patient she has a small open area remaining on the left lateral calf and even smaller area medially. She has lymphedema chronic stasis changes with distal skin fibrosis. She has an appointment with vascular surgery later this month 01/24/2018; the patient's medial leg has closed. Still a small open area on the left lateral leg. She states that the 4 layer compression we put on last week was too tight and she had to take it off over a few days ago. We have had resultant increase in her lymphedema in the dorsal foot and a proximal calf. Fortunately that does not seem to have resulted in any deterioration in her wounds The patient is going to need compression stockings. We have given her measurements to phone elastic therapy in [place]. She has vascular surgery consult on December 26 02/07/18;  she's had continuous contraction on the left lateral leg wound which is now very small. Currently using silver alginate under 3 layer compression She saw Dr. Trula Slade of vascular surgery on 02/06/18. It was noted that she had a normal reflux times in the popliteal vein, great  saphenous vein at the saphenofemoral junction, great saphenous vein at the proximal thigh great saphenous vein at the mid calf and origin of the small saphenous vein. It was noted that she had significant reflux in the left saphenous veins with diameter measurements in the 0.7-0.8 cm range. It was felt she would benefit from laser ablation to help minimize the risk of ulcer recurrence. It was recommended that she wear 20-30 thigh-high compression stockings and have follow-up in 4-6 weeks 02/17/2018; I thought this lady would be healed however her compression slipped down and she developed increasing swelling and the wound is actually larger. 1/13; we had deterioration last week after the patient's compression slipped down and she developed periwound swelling. I increased her compression before layers we have been using silver alginate we are a lot better again today. She has her compression stockings in waiting 1/23; the patient's wounds are totally healed today. She has her stockings. This was almost circumferential skin damage. She follows up with Dr. Trula Slade of vascular surgery next Monday. READMISSION 02/21/2021 This is a now 66 year old woman that we had in clinic here discharging in January 2020 with wounds on her left calf chronic venous insufficiency. She was discharged with 30/40 stockings. It does not sound like she has worn stockings in about a year largely from not being able to get them on herself. In November she developed new blisters on her legs an area laterally is opened into a fairly sizable wound. She has weeping posteriorly as well. She has been using Neosporin and Band-Aids. The patient did see Dr. Trula Slade in 2019 and 2020. He felt she might benefit from laser ablation in her left saphenous veins although because of the wound that healed I do not think he went through with it. She might benefit from seeing him again. Her ABI on the left is 1. 1/17; patient's wound on the  posterior left calf is closed she has the 2 large areas last week. We put her in 4-layer compression for edema control is a lot better. Our intake nurse noted greenish drainage and odor. We have been using silver alginate 1/25; PCR culture I did have the substantial wound area on the left lateral lower leg showed staff aureus and group A strep. Low titers of coag negative staph which are probably skin contaminants. Resistance detected to tetracycline methicillin and macrolides. I gave her a starter kit of Nuzyra 150 mg x 3 for 2 days then 300 mg for a further 7 days. Marked odor considerable increase in surrounding erythema. We are using Iodoflex last week however I have changed to silver alginate with underlying Bactroban 2/1; she is completing her [place] tomorrow. The degree of erythema around the wounds looks a lot better. Odor has improved. I gave her silver alginate and Bactroban last week and changing her back to Iodoflex to continue with ongoing debridement 2/8; the periwound looks a lot better. Surface of the wound also looks somewhat better although there is still ongoing debridement to be done we have been using Iodoflex under compression. Primary dressing and silver alginate 2/15; left posterior calf. Some improvement in the surface of the wound but still very gritty we have been using Iodoflex under compression. 04/05/2021:  Left lateral posterior calf. She continues to have significant amounts of drainage, some of which is probably comprised of the Iodoflex microbleeds. There is no odor to the drainage. The satellite lesion on the more posterior aspect of the calf is epithelializing nicely and has contracted quite a bit. There is robust granulation tissue in the dominant wound with minimal adherent slough. 04/12/2021: The satellite lesion has nearly closed. She continues to have good granulation tissue with minimal slough of the larger primary wound. She continues to have a fair amount of  drainage, but I think this is less secondary to switching her dressing from Iodoflex to Prisma. 04/19/2021: The satellite lesion is almost completely epithelialized. The larger primary wound continues to contract with good granulation tissue and minimal slough. She is currently in [place] with compression. 04/26/2021: The satellite lesion has closed. The larger primary wound has contracted further and the granulation tissue is robust without being hypertrophic. Minimal slough is present. 05/03/2021: The satellite lesion remains closed. The larger primary wound has a bit of slough, but has also contracted further with good perimeter epithelialization. Good granulation tissue at the wound surface. There was some greenish drainage on the dressing when it was removed; it is not the blue-green typically associated with Pseudomonas aeruginosa, however. 05/10/2021: The primary wound continues to contract. There is minimal slough. The granulation tissue at 9:00 is a bit hypertrophic. No significant drainage. No odor. 05/17/2021: The wound is a little bit smaller today with good granulation tissue. Minimal slough. No significant drainage or odor. 05/24/2021: The wound continues to contract and has a nice base of granulation tissue. Small amount of slough. No concern for infection. 05/31/2021: For some reason, the wound measured slightly larger today but overall it still appears to be in good condition with a nice base of granulation tissue and minimal slough. 06/07/2021: The wound is smaller today. Good granulation tissue and minimal slough. 06/14/2021: The wound is unchanged in size. She continues to accumulate some slough. Good granulation tissue on the surface. 06/20/2021: The wound is smaller today. Minimal slough with good granulation tissue. 06/27/2021: The wound on her left lateral leg is smaller today with just a bit of slough and eschar accumulation. Unfortunately, she has opened a new superficial wound on her  right lower extremity. She has 3+ pitting edema to the knees on that leg and she does not wear compression stockings. 07/04/2021: In addition to the superficial wound on her right lower extremity that she opened up last week, she has opened 3 additional sites on the same leg. Her compression wraps were clearly not in correct position when she came to clinic today as they were at about the mid calf level rather than to the tibial tuberosity. She says that they slipped earlier and she had to come back on Friday to have them redone. The wound on her left lateral leg is perhaps slightly larger with some slough accumulation. 07/11/2021: The superficial wounds on her right lower extremity are nearly closed. They just have a thin layer of eschar overlying them. The wound on her left lateral leg is a little bit shallower and has a bit of slough accumulation. 07/17/2021: The right medial lower extremity leg wounds have almost completely closed; one of them just has a tiny opening with a little bit of serous drainage. The left lateral leg wound is really unchanged. It seems to be stalled. 07/24/2021: The right leg wounds are completely closed. The left lateral leg wound is actually bigger today. It  is a bit more tender. There is a minor accumulation of slough on the surface. 07/31/2021: The culture that I took last week was positive for MRSA. We added mupirocin under the silver alginate and I prescribed doxycycline. She has been tolerating this well. The wound is smaller today but does have slough accumulation. No significant pain or drainage. 08/07/2021: She completed her course of doxycycline. The wound looks much better today. It is smaller and the periwound is less inflamed. She does have some slough accumulation on the surface. 08/14/2021: The wound continues to contract. There is slough on the wound surface, but the periwound is intact without inflammation or induration. 08/21/2021: The wound is down to just 2  small open sites. They both have a bit of slough accumulation. 08/28/2021: The wound is down to just 1 small open site with a little bit of slough and eschar accumulation. Patient History Information obtained from Patient. Family History Heart Disease - Mother,Father, Hypertension - Mother,Father, Kidney Disease - Mother, Thyroid Problems - Mother, No family history of Cancer, Diabetes, Hereditary Spherocytosis, Lung Disease, Seizures, Stroke, Tuberculosis. Social History Never smoker, Marital Status - Single, Alcohol Use - Never, Drug Use - No History, Caffeine Use - Daily - coffee. Medical History Eyes Patient has history of Cataracts Hematologic/Lymphatic Patient has history of Lymphedema Cardiovascular Patient has history of Hypertension, Peripheral Venous Disease Integumentary (Skin) Denies history of History of Burn Musculoskeletal Patient has history of Osteoarthritis Hospitalization/Surgery History - colostomy reversal. 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 Medical A Surgical History Notes nd Constitutional Symptoms (General Health) morbid obesity Cardiovascular schamberg disease, hyperlipidemia Gastrointestinal diverticulitis , h/o obstruction due to diverticulitis , colostomy and colostomy reversal Endocrine hypothyroidism Objective Constitutional No acute distress.. Vitals Time Taken: 9:10 AM, Height: 62 in, Weight: 226 lbs, BMI: 41.3, Temperature: 98 F, Pulse: 75 bpm, Respiratory Rate: 16 breaths/min, Blood Pressure: 122/66 mmHg. Respiratory Normal work of breathing on room air.. General Notes: 08/28/2021: The wound is down to just 1 small open site with a little bit of slough and eschar accumulation. Integumentary (Hair, Skin) Wound #3 status is Open. Original cause of wound was Blister. The date acquired was: 12/13/2020. The wound has been in treatment 26 weeks. The wound is located on the Left,Lateral Lower Leg. The wound measures 0.3cm length x 0.4cm width x 0.1cm  depth; 0.094cm^2 area and 0.009cm^3 volume. There is Fat Layer (Subcutaneous Tissue) exposed. There is no tunneling or undermining noted. There is a medium amount of serosanguineous drainage noted. The wound margin is distinct with the outline attached to the wound base. There is small (1-33%) pink granulation within the wound bed. There is a large (67-100%) amount of necrotic tissue within the wound bed including Adherent Slough. 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 There was a Selective/Open Wound Non-Viable Tissue Debridement with a total area of 0.12 sq cm performed by [name], MD. With the following instrument(s): Curette to remove Non-Viable tissue/material. Material removed includes Eschar and Slough and after achieving pain control using Lidocaine 5% topical ointment. No specimens were taken. A time out was conducted at 09:36, prior to the start of the procedure. A Minimum amount of bleeding was controlled with Pressure. The procedure was  tolerated well with a pain level of 0 throughout and a pain level of 0 following the procedure. Post Debridement Measurements: 0.3cm length x 0.4cm width x 0.1cm depth; 0.009cm^3 volume. Character of Wound/Ulcer Post Debridement is improved. Severity of Tissue Post Debridement is: Fat layer exposed. Post procedure Diagnosis Wound #3: Same as Pre-Procedure Pre-procedure diagnosis of Wound #3 is a Venous Leg Ulcer located on the Left,Lateral Lower Leg . There was a Double Layer Compression Therapy Procedure by [name], RN. Post procedure Diagnosis Wound #3: Same as  Pre-Procedure Plan Follow-up Appointments: Return Appointment in 1 week. - Dr. Celine Ahr Room 3 Monday 7/24 at 1:15pm Bathing/ Shower/ Hygiene: May shower with protection but do not get wound dressing(s) wet. - Ok to use [employer], can purchase at [facility], [facility], or [employer] Edema Control - Lymphedema / SCD / Other: Elevate legs to the level of the heart or above for 30 minutes daily and/or when sitting, a frequency of: - throughout the day Avoid standing for long periods of time. Exercise regularly Compression stocking or Garment 20-30 mm/Hg pressure to: - Brochure for Elastic Therapy. Leg measurements included WOUND #3: - Lower Leg Wound Laterality: Left, Lateral Cleanser: Soap and Water 1 x Per Week/30 Days Discharge Instructions: May shower and wash wound with dial antibacterial soap and water prior to dressing change. Cleanser: Wound Cleanser 1 x Per Week/30 Days Discharge Instructions: Cleanse the wound with wound cleanser prior to applying a clean dressing using gauze sponges, not tissue or cotton balls. Peri-Wound Care: Sween Lotion (Moisturizing lotion) 1 x Per Week/30 Days Discharge Instructions: Apply moisturizing lotion as directed Topical: Mupirocin Ointment 1 x Per Week/30 Days Discharge Instructions: Apply Mupirocin (Bactroban) thin layer to wound bed Prim Dressing: KerraCel Ag Gelling Fiber Dressing, 4x5 in (silver alginate) 1 x Per Week/30 Days ary Discharge Instructions: Apply silver alginate to wound bed as instructed Secondary Dressing: Zetuvit Plus 4x8 in 1 x Per Week/30 Days Discharge Instructions: Apply over primary dressing as directed. Com pression Wrap: CoFlex TLC XL 2-layer Compression System 4x7 (in/yd) 1 x Per Week/30 Days Discharge Instructions: Apply CoFlex 2-layer compression as directed. (alt for 4 layer) 08/28/2021: The wound is down to just 1 small open site with a little bit of slough and eschar accumulation. I used a curette to debride eschar and slough  from the wound. We will continue using topical mupirocin with silver alginate and Coflex compression. Follow-up in 1 week. Electronic Signature(s) Signed: 08/28/2021 9:44:47 AM By: [name] MD FACS Entered By: [name] on 08/28/2021 09:44:47 -------------------------------------------------------------------------------- HxROS Details Patient Name: Date of Service: Erin Gerold [name] 08/28/2021 8:45 A M Medical Record Number: [id number] Patient Account Number: [id number] Date of Birth/Sex: Treating RN: Dec 26, 1955 (66 y.o. [name] Primary Care Provider: [name] Other Clinician: Referring Provider: Treating Provider/Extender: [name] in Treatment: 26 Information Obtained From Patient Constitutional Symptoms (General Health) Medical History: Past Medical History Notes: morbid obesity Eyes Medical History: Positive for: Cataracts Hematologic/Lymphatic Medical History: Positive for: Lymphedema Cardiovascular Medical History: Positive for: Hypertension; Peripheral Venous Disease Past Medical History Notes: schamberg disease, hyperlipidemia Gastrointestinal Medical History: Past Medical History Notes: diverticulitis , h/o obstruction due to diverticulitis , colostomy and colostomy reversal Endocrine Medical History: Past Medical History Notes: hypothyroidism Integumentary (Skin) Medical History: Negative for: History of Burn Musculoskeletal Medical History: Positive for: Osteoarthritis HBO Extended History Items Eyes: Cataracts Immunizations Pneumococcal Vaccine: Received Pneumococcal Vaccination: Yes Received Pneumococcal Vaccination On or After 60th Birthday: Yes Implantable Devices  None Hospitalization / Surgery History Type of Hospitalization/Surgery colostomy reversal 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 Family and Social History Cancer: No; Diabetes: No; Heart Disease: Yes - Mother,Father; Hereditary  Spherocytosis: No; Hypertension: Yes - Mother,Father; Kidney Disease: Yes - Mother; Lung Disease: No; Seizures: No; Stroke: No; Thyroid Problems: Yes - Mother; Tuberculosis: No; Never smoker; Marital Status - Single; Alcohol Use: Never; Drug Use: No History; Caffeine Use: Daily - coffee; Financial Concerns: No; Food, Clothing or Shelter Needs: No; Support System Lacking: No; Transportation Concerns: No Electronic Signature(s) Signed: 08/28/2021 9:51:33 AM By: [name] MD FACS Signed: 08/28/2021 10:09:05 AM By: [name] RN Entered By: [name] on 08/28/2021 09:43:40 -------------------------------------------------------------------------------- SuperBill Details Patient Name: Date of Service: Erin Face D. 08/28/2021 Medical Record Number: [id number] Patient Account Number: [id number] Date of Birth/Sex: Treating RN: 04/10/1955 (66 y.o. [name] Primary Care Provider: [name] Other Clinician: Referring Provider: Treating Provider/Extender: [name] in Treatment: 26 Diagnosis Coding ICD-10 Codes Code Description [phone] Chronic venous hypertension (idiopathic) with ulcer and inflammation of left lower extremity L97.828 Non-pressure chronic ulcer of other part of left lower leg with other specified severity Facility Procedures CPT4 Code: [id number] Description: [id number] - DEBRIDE WOUND 1ST 20 SQ CM OR < ICD-10 Diagnosis Description L97.828 Non-pressure chronic ulcer of other part of left lower leg with other specified se Modifier: verity Quantity: 1 Physician Procedures : CPT4 Code Description Modifier [id number] - WC PHYS LEVEL 4 - EST PT 25 ICD-10 Diagnosis Description L97.828 Non-pressure chronic ulcer of other part of left lower leg with other specified severity 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 (idiopathic)  with ulcer and inflammation of left lower extremity Quantity: 1 : [id number] - WC PHYS DEBR WO  ANESTH 20 SQ CM ICD-10 Diagnosis Description L97.828 Non-pressure chronic ulcer of other part of left lower leg with other specified severity Quantity: 1 Electronic Signature(s) Signed: 08/28/2021 9:45:01 AM By: [name] MD FACS Entered By: [name] on 08/28/2021 09:45:01

## 2021-08-28 NOTE — Progress Notes (Signed)
[name], [name] ([id number]) Visit Report for 08/28/2021 Arrival Information Details Patient Name: Date of Service: [name], [name] 08/28/2021 8:45 A M Medical Record Number: [id number] Patient Account Number: [id number] Date of Birth/Sex: Treating RN: [birth year] (66 y.o. [name] Primary Care Avalin Briley: [name] Other Clinician: Referring Elizar Alpern: Treating Wenona Mayville/Extender: [name] in Treatment: 66 Visit Information History Since Last Visit Added or deleted any medications: No Patient Arrived: Cane Any new allergies or adverse reactions: No Arrival Time: 09:08 Had a fall or experienced change in No Accompanied By: self activities of daily living that may affect Transfer Assistance: None risk of falls: Patient Identification Verified: Yes Signs or symptoms of abuse/neglect since last visito No Patient Requires Transmission-Based Precautions: No Hospitalized since last visit: No Patient Has Alerts: No Implantable device outside of the clinic excluding No cellular tissue based products placed in the center since last visit: Has Dressing in Place as Prescribed: Yes Has Compression in Place as Prescribed: Yes Pain Present Now: No Electronic Signature(s) Signed: 08/28/2021 10:09:05 AM By: [name] RN Entered By: [name] on 08/28/2021 09:09:42 -------------------------------------------------------------------------------- Compression Therapy Details Patient Name: Date of Service: Erin Face D. 08/28/2021 8:45 A M Medical Record Number: [id number] Patient Account Number: [id number] Date of Birth/Sex: Treating RN: Dec 18, 1955 (66 y.o. [name] Primary Care Mekai Wilkinson: [name] Other Clinician: Referring Linsi Humann: Treating Lilleigh Hechavarria/Extender: [name] in Treatment: 26 Compression Therapy Performed for Wound Assessment: Wound #3 Left,Lateral Lower Leg Performed By:  Clinician [name], RN Compression Type: Double Layer Post Procedure Diagnosis Same as Pre-procedure Electronic Signature(s) Signed: 08/28/2021 10:09:05 AM By: [name] RN Entered By: [name] on 08/28/2021 09:41:41 -------------------------------------------------------------------------------- Encounter Discharge Information Details Patient Name: Date of Service: Erin Face D. 08/28/2021 8:45 A M Medical Record Number: [id number] Patient Account Number: [id number] Date of Birth/Sex: Treating RN: 1955-03-22 (66 y.o. [name] Primary Care Nikita Humble: [name] Other Clinician: Referring Maxamillion Banas: Treating Tristian Sickinger/Extender: [name] in Treatment: 26 Encounter Discharge Information Items Post Procedure Vitals Discharge Condition: Stable Temperature (F): 98 Ambulatory Status: Cane Pulse (bpm): 75 Discharge Destination: Home Respiratory Rate (breaths/min): 16 Transportation: Private Auto Blood Pressure (mmHg): 122/66 Accompanied By: self Schedule Follow-up Appointment: Yes Clinical Summary of Care: Patient Declined Electronic Signature(s) Signed: 08/28/2021 10:09:05 AM By: [name] RN Entered By: [name] on 08/28/2021 10:07:28 -------------------------------------------------------------------------------- Lower Extremity Assessment Details Patient Name: Date of Service: Erin Face D. 08/28/2021 8:45 A M Medical Record Number: [id number] Patient Account Number: [id number] Date of Birth/Sex: Treating RN: 07-Jan-1956 (66 y.o. [name] Primary Care Roselynn Whitacre: [name] Other Clinician: Referring Sharlena Kristensen: Treating Kayela Humphres/Extender: [name] in Treatment: 26 Edema Assessment Assessed: [Left: No] [Right: No] Edema: [Left: Yes] [Right: Yes] Calf Left: Right: Point of Measurement: 29 cm From Medial Instep 40 cm 42 cm Ankle Left: Right: Point of  Measurement: 10 cm From Medial Instep 22.8 cm 26 cm Electronic Signature(s) Signed: 08/28/2021 10:09:05 AM By: [name] RN Entered By: [name] on 08/28/2021 09:23:32 -------------------------------------------------------------------------------- Multi Wound Chart Details Patient Name: Date of Service: Erin Face D. 08/28/2021 8:45 A M Medical Record Number: [id number] Patient Account Number: [id number] Date of Birth/Sex: Treating RN: Jun 14, 1955 (66 y.o. [name] Primary Care Raju Coppolino: [name] Other Clinician: Referring Minta Fair: Treating Sufyan Meidinger/Extender: [name] in Treatment: 26 Vital Signs Height(in): 62 Pulse(bpm): 75 Weight(lbs): 226 Blood Pressure(mmHg): 122/66 Body Mass Index(BMI): 41.3 Temperature(F): 98  Respiratory Rate(breaths/min): 16 Photos: [N/A:N/A] Left, Lateral Lower Leg N/A N/A Wound Location: Blister N/A N/A Wounding Event: Venous Leg Ulcer N/A N/A Primary Etiology: Cataracts, Lymphedema, N/A N/A Comorbid History: Hypertension, Peripheral Venous Disease, Osteoarthritis 12/13/2020 N/A N/A Date Acquired: 34 N/A N/A Weeks of Treatment: Open N/A N/A Wound Status: No N/A N/A Wound Recurrence: 0.3x0.4x0.1 N/A N/A Measurements L x W x D (cm) 0.094 N/A N/A A (cm) : rea 0.009 N/A N/A Volume (cm) : 99.70% N/A N/A % Reduction in A rea: 99.80% N/A N/A % Reduction in Volume: Full Thickness Without Exposed N/A N/A Classification: Support Structures Medium N/A N/A Exudate A mount: Serosanguineous N/A N/A Exudate Type: red, [name] N/A N/A Exudate Color: Distinct, outline attached N/A N/A Wound Margin: Small (1-33%) N/A N/A Granulation A mount: Pink N/A N/A Granulation Quality: Large (67-100%) N/A N/A Necrotic A mount: Fat Layer (Subcutaneous Tissue): Yes N/A N/A Exposed Structures: Fascia: No Tendon: No Muscle: No Joint: No Bone: No Medium (34-66%) N/A  N/A Epithelialization: Debridement - Selective/Open Wound N/A N/A Debridement: Pre-procedure Verification/Time Out 09:36 N/A N/A Taken: Lidocaine 5% topical ointment N/A N/A Pain Control: Necrotic/Eschar, Slough N/A N/A Tissue Debrided: Non-Viable Tissue N/A N/A Level: 0.12 N/A N/A Debridement A (sq cm): rea Curette N/A N/A Instrument: Minimum N/A N/A Bleeding: Pressure N/A N/A Hemostasis A chieved: 0 N/A N/A Procedural Pain: 0 N/A N/A Post Procedural Pain: Procedure was tolerated well N/A N/A Debridement Treatment Response: 0.3x0.4x0.1 N/A N/A Post Debridement Measurements L x W x D (cm) 0.009 N/A N/A Post Debridement Volume: (cm) Compression Therapy N/A N/A Procedures Performed: Debridement Treatment Notes Electronic Signature(s) Signed: 08/28/2021 9:42:59 AM By: [name] MD FACS Signed: 08/28/2021 10:09:05 AM By: [name] RN Previous Signature: 08/28/2021 9:39:55 AM Version By: [name] MD FACS Entered By: [name] on 08/28/2021 09:42:59 -------------------------------------------------------------------------------- Multi-Disciplinary Care Plan Details Patient Name: Date of Service: Erin Face D. 08/28/2021 8:45 A M Medical Record Number: [id number] Patient Account Number: [id number] Date of Birth/Sex: Treating RN: 06-Jan-1956 (66 y.o. [name] Primary Care Jamella Grayer: [name] Other Clinician: Referring Amaziah Raisanen: Treating Leslieann Whisman/Extender: [name] in Treatment: 26 Multidisciplinary Care Plan reviewed with physician Active Inactive Venous Leg Ulcer Nursing Diagnoses: Actual venous Insuffiency (use after diagnosis is confirmed) Goals: Patient will maintain optimal edema control Date Initiated: 02/21/2021 Target Resolution Date: 10/12/2021 Goal Status: Active Patient/caregiver will verbalize understanding of disease process and disease management Date Initiated:  02/21/2021 Date Inactivated: 03/29/2021 Target Resolution Date: 03/24/2021 Goal Status: Met Interventions: Assess peripheral edema status every visit. Compression as ordered Provide education on venous insufficiency Notes: Wound/Skin Impairment Nursing Diagnoses: Impaired tissue integrity Knowledge deficit related to ulceration/compromised skin integrity Goals: Patient/caregiver will verbalize understanding of skin care regimen Date Initiated: 02/21/2021 Target Resolution Date: 10/12/2021 Goal Status: Active Ulcer/skin breakdown will have a volume reduction of 30% by week 4 Date Initiated: 02/21/2021 Date Inactivated: 03/29/2021 Target Resolution Date: 03/24/2021 Goal Status: Unmet Unmet Reason: infection Ulcer/skin breakdown will have a volume reduction of 50% by week 8 Date Initiated: 03/29/2021 Date Inactivated: 04/19/2021 Target Resolution Date: 04/21/2021 Goal Status: Met Interventions: Assess patient/caregiver ability to obtain necessary supplies Assess patient/caregiver ability to perform ulcer/skin care regimen upon admission and as needed Assess ulceration(s) every visit Provide education on ulcer and skin care Notes: Electronic Signature(s) Signed: 08/28/2021 10:09:05 AM By: [name] RN Entered By: [name] on 08/28/2021 10:05:34 -------------------------------------------------------------------------------- Pain Assessment Details Patient Name: Date of Service: Erin Face D. 08/28/2021 8:45 A M Medical  Record Number: [id number] Patient Account Number: [id number] Date of Birth/Sex: Treating RN: [birth year] (66 y.o. [name] Primary Care Shewanda Sharpe: [name] Other Clinician: Referring Jansel Vonstein: Treating Lovetta Condie/Extender: [name] in Treatment: 26 Active Problems Location of Pain Severity and Description of Pain Patient Has Paino No Site Locations Pain Management and Medication Current Pain  Management: Electronic Signature(s) Signed: 08/28/2021 10:09:05 AM By: [name] RN Entered By: [name] on 08/28/2021 09:10:20 -------------------------------------------------------------------------------- Patient/Caregiver Education Details Patient Name: Date of Service: [name] 7/17/2023andnbsp8:45 [place] Record Number: [id number] Patient Account Number: [id number] Date of Birth/Gender: Treating RN: 26-Feb-1955 (66 y.o. [name] Primary Care Physician: [name] Other Clinician: Referring Physician: Treating Physician/Extender: [name] in Treatment: 26 Education Assessment Education Provided To: Patient Education Topics Provided Wound/Skin Impairment: Methods: Explain/Verbal Responses: Return demonstration correctly Electronic Signature(s) Signed: 08/28/2021 10:09:05 AM By: [name] RN Entered By: [name] on 08/28/2021 10:05:53 -------------------------------------------------------------------------------- Wound Assessment Details Patient Name: Date of Service: Erin Face D. 08/28/2021 8:45 A M Medical Record Number: [id number] Patient Account Number: [id number] Date of Birth/Sex: Treating RN: [birth year] (66 y.o. [name] Primary Care Chaitanya Amedee: [name] Other Clinician: Referring Korinna Tat: Treating Darriana Deboy/Extender: [name] in Treatment: 26 Wound Status Wound Number: 3 Primary Venous Leg Ulcer Etiology: Wound Location: Left, Lateral Lower Leg Wound Open Wounding Event: Blister Status: Date Acquired: 12/13/2020 Comorbid Cataracts, Lymphedema, Hypertension, Peripheral Venous Weeks Of Treatment: 26 History: Disease, Osteoarthritis Clustered Wound: No Photos Wound Measurements Length: (cm) 0.3 Width: (cm) 0.4 Depth: (cm) 0.1 Area: (cm) 0.094 Volume: (cm) 0.009 % Reduction in Area: 99.7% % Reduction in Volume:  99.8% Epithelialization: Medium (34-66%) Tunneling: No Undermining: No Wound Description Classification: Full Thickness Without Exposed Support Structures Wound Margin: Distinct, outline attached Exudate Amount: Medium Exudate Type: Serosanguineous Exudate Color: red, [name] Foul Odor After Cleansing: No Slough/Fibrino Yes Wound Bed Granulation Amount: Small (1-33%) Exposed Structure Granulation Quality: Pink Fascia Exposed: No Necrotic Amount: Large (67-100%) Fat Layer (Subcutaneous Tissue) Exposed: Yes Necrotic Quality: Adherent Slough Tendon Exposed: No Muscle Exposed: No Joint Exposed: No Bone Exposed: No Treatment Notes Wound #3 (Lower Leg) Wound Laterality: Left, Lateral Cleanser Soap and Water Discharge Instruction: May shower and wash wound with dial antibacterial soap and water prior to dressing change. Wound Cleanser Discharge Instruction: Cleanse the wound with wound cleanser prior to applying a clean dressing using gauze sponges, not tissue or cotton balls. Peri-Wound Care Sween Lotion (Moisturizing lotion) Discharge Instruction: Apply moisturizing lotion as directed Topical Mupirocin Ointment Discharge Instruction: Apply Mupirocin (Bactroban) thin layer to wound bed Primary Dressing KerraCel Ag Gelling Fiber Dressing, 4x5 in (silver alginate) Discharge Instruction: Apply silver alginate to wound bed as instructed Secondary Dressing Zetuvit Plus 4x8 in Discharge Instruction: Apply over primary dressing as directed. Secured With Compression Wrap CoFlex TLC XL 2-layer Compression System 4x7 (in/yd) Discharge Instruction: Apply CoFlex 2-layer compression as directed. (alt for 4 layer) Compression Stockings Add-Ons Electronic Signature(s) Signed: 08/28/2021 10:09:05 AM By: [name] RN Entered By: [name] on 08/28/2021 09:31:41 -------------------------------------------------------------------------------- Vitals Details Patient Name: Date of  Service: Erin Face D. 08/28/2021 8:45 A M Medical Record Number: [id number] Patient Account Number: [id number] Date of Birth/Sex: Treating RN: 1955/05/30 (66 y.o. [name] Primary Care Rutger Salton: [name] Other Clinician: Referring Montre Harbor: Treating Maryssa Giampietro/Extender: [name] in Treatment: 26 Vital Signs Time Taken: 09:10 Temperature (F): 98 Height (in): 62 Pulse (bpm): 75 Weight (lbs):  226 Respiratory Rate (breaths/min): 16 Body Mass Index (BMI): 41.3 Blood Pressure (mmHg): 122/66 Reference Range: 80 - 120 mg / dl Electronic Signature(s) Signed: 08/28/2021 10:09:05 AM By: [name] RN Entered By: [name] on 08/28/2021 09:10:10

## 2021-08-31 ENCOUNTER — Encounter: Payer: Self-pay | Admitting: Internal Medicine

## 2021-08-31 ENCOUNTER — Ambulatory Visit: Payer: 59 | Admitting: Internal Medicine

## 2021-08-31 ENCOUNTER — Other Ambulatory Visit (HOSPITAL_COMMUNITY): Payer: Self-pay

## 2021-08-31 VITALS — BP 124/80 | HR 81 | Temp 98.2°F | Ht 60.6 in | Wt 220.0 lb

## 2021-08-31 DIAGNOSIS — Z6841 Body Mass Index (BMI) 40.0 and over, adult: Secondary | ICD-10-CM

## 2021-08-31 DIAGNOSIS — I129 Hypertensive chronic kidney disease with stage 1 through stage 4 chronic kidney disease, or unspecified chronic kidney disease: Secondary | ICD-10-CM

## 2021-08-31 DIAGNOSIS — I739 Peripheral vascular disease, unspecified: Secondary | ICD-10-CM | POA: Diagnosis not present

## 2021-08-31 DIAGNOSIS — N182 Chronic kidney disease, stage 2 (mild): Secondary | ICD-10-CM

## 2021-08-31 MED ORDER — WEGOVY 0.5 MG/0.5ML ~~LOC~~ SOAJ
0.5000 mg | SUBCUTANEOUS | 1 refills | Status: DC
  Filled 2021-08-31: qty 2, 28d supply, fill #0
  Filled 2021-10-30: qty 2, fill #0
  Filled 2021-11-24: qty 2, 28d supply, fill #0

## 2021-08-31 NOTE — Patient Instructions (Signed)
Exercising to Lose Weight ?Getting regular exercise is important for everyone. It is especially important if you are overweight. Being overweight increases your risk of heart disease, stroke, diabetes, high blood pressure, and several types of cancer. Exercising, and reducing the calories you consume, can help you lose weight and improve fitness and health. ?Exercise can be moderate or vigorous intensity. To lose weight, most people need to do a certain amount of moderate or vigorous-intensity exercise each week. ?How can exercise affect me? ?You lose weight when you exercise enough to burn more calories than you eat. Exercise also reduces body fat and builds muscle. The more muscle you have, the more calories you burn. Exercise also: ?Improves mood. ?Reduces stress and tension. ?Improves your overall fitness, flexibility, and endurance. ?Increases bone strength. ?Moderate-intensity exercise ? ?Moderate-intensity exercise is any activity that gets you moving enough to burn at least three times more energy (calories) than if you were sitting. ?Examples of moderate exercise include: ?Walking a mile in 15 minutes. ?Doing light yard work. ?Biking at an easy pace. ?Most people should get at least 150 minutes of moderate-intensity exercise a week to maintain their body weight. ?Vigorous-intensity exercise ?Vigorous-intensity exercise is any activity that gets you moving enough to burn at least six times more calories than if you were sitting. When you exercise at this intensity, you should be working hard enough that you are not able to carry on a conversation. ?Examples of vigorous exercise include: ?Running. ?Playing a team sport, such as football, basketball, and soccer. ?Jumping rope. ?Most people should get at least 75 minutes a week of vigorous exercise to maintain their body weight. ?What actions can I take to lose weight? ?The amount of exercise you need to lose weight depends on: ?Your age. ?The type of  exercise. ?Any health conditions you have. ?Your overall physical ability. ?Talk to your health care provider about how much exercise you need and what types of activities are safe for you. ?Nutrition ? ?Make changes to your diet as told by your health care provider or diet and nutrition specialist (dietitian). This may include: ?Eating fewer calories. ?Eating more protein. ?Eating less unhealthy fats. ?Eating a diet that includes fresh fruits and vegetables, whole grains, low-fat dairy products, and lean protein. ?Avoiding foods with added fat, salt, and sugar. ?Drink plenty of water while you exercise to prevent dehydration or heat stroke. ?Activity ?Choose an activity that you enjoy and set realistic goals. Your health care provider can help you make an exercise plan that works for you. ?Exercise at a moderate or vigorous intensity most days of the week. ?The intensity of exercise may vary from person to person. You can tell how intense a workout is for you by paying attention to your breathing and heartbeat. Most people will notice their breathing and heartbeat get faster with more intense exercise. ?Do resistance training twice each week, such as: ?Push-ups. ?Sit-ups. ?Lifting weights. ?Using resistance bands. ?Getting short amounts of exercise can be just as helpful as long, structured periods of exercise. If you have trouble finding time to exercise, try doing these things as part of your daily routine: ?Get up, stretch, and walk around every 30 minutes throughout the day. ?Go for a walk during your lunch break. ?Park your car farther away from your destination. ?If you take public transportation, get off one stop early and walk the rest of the way. ?Make phone calls while standing up and walking around. ?Take the stairs instead of elevators or escalators. ?  Wear comfortable clothes and shoes with good support. ?Do not exercise so much that you hurt yourself, feel dizzy, or get very short of breath. ?Where to  find more information ?[place] Department of Health and Human Services: www.hhs.gov ?Centers for Disease Control and Prevention: www.cdc.gov ?Contact a health care provider: ?Before starting a new exercise program. ?If you have questions or concerns about your weight. ?If you have a medical problem that keeps you from exercising. ?Get help right away if: ?You have any of the following while exercising: ?Injury. ?Dizziness. ?Difficulty breathing or shortness of breath that does not go away when you stop exercising. ?Chest pain. ?Rapid heartbeat. ?These symptoms may represent a serious problem that is an emergency. Do not wait to see if the symptoms will go away. Get medical help right away. Call your local emergency services (911 in the [place] Do not drive yourself to the hospital. ?Summary ?Getting regular exercise is especially important if you are overweight. ?Being overweight increases your risk of heart disease, stroke, diabetes, high blood pressure, and several types of cancer. ?Losing weight happens when you burn more calories than you eat. ?Reducing the amount of calories you eat, and getting regular moderate or vigorous exercise each week, helps you lose weight. ?This information is not intended to replace advice given to you by your health care provider. Make sure you discuss any questions you have with your health care provider. ?Document Revised: 03/27/2020 Document Reviewed: 03/27/2020 ?[employer]vier Patient Education ? 2023 [employer] ? ?

## 2021-08-31 NOTE — Progress Notes (Signed)
[name],acting as a Education administrator for [name], MD.,have documented all relevant documentation on the behalf of [name], MD,as directed by  [name], MD while in the presence of [name], MD.    Subjective:     Patient ID: [name] , female    DOB: 1955-11-19 , 66 y.o.   MRN: [id number]   Chief Complaint  Patient presents with   Weight Check    HPI  :Patient presents today for a weight check. Patient is tolerating the semaglutide 0.'5mg'$  weekly without any issues. She report she originally had issues with the 0.'25mg'$  dose; however, they have since subsided. She has no other concerns or complaints at this time.   Hypertension This is a chronic problem. The current episode started more than 1 year ago. The problem has been gradually improving since onset. The problem is controlled. Pertinent negatives include no blurred vision, chest pain, palpitations or shortness of breath. Risk factors for coronary artery disease include obesity, sedentary lifestyle and post-menopausal state. Compliance problems include exercise.      Past Medical History:  Diagnosis Date   Arthritis    DOE (dyspnea on exertion)    mild   Hyperlipidemia    Hypertension    Hypothyroidism    Morbid obesity ([place])    Pre-diabetes    Schamberg disease      Family History  Problem Relation Age of Onset   Hypertension Mother    Heart Problems Mother        Pacemaker   Heart attack Father        at age 75.   Heart attack Maternal Grandfather 92       Died of AAA   AAA (abdominal aortic aneurysm) Maternal Grandfather    Heart attack Maternal Uncle    Stroke Paternal Aunt    Alzheimer's disease Maternal Grandmother      Current Outpatient Medications:    amLODipine (NORVASC) 5 MG tablet, Take 1 tablet (5 mg total) by mouth daily., Disp: 90 tablet, Rfl: 2   Calcium Carbonate-Vitamin D 600-400 MG-UNIT tablet, Take 1 tablet by mouth daily., Disp: , Rfl:     Cholecalciferol (VITAMIN D) 2000 units tablet, Take 4,000 Units by mouth daily., Disp: , Rfl:    doxycycline (VIBRA-TABS) 100 MG tablet, Take 1 tablet (100 mg total) by mouth 2 (two) times daily for 10 days, Disp: 20 tablet, Rfl: 0   levothyroxine (SYNTHROID) 112 MCG tablet, Take 1 tablet (112 mcg total) by mouth daily., Disp: 90 tablet, Rfl: 1   lisinopril (ZESTRIL) 20 MG tablet, Take 1 tablet (20 mg total) by mouth daily., Disp: 90 tablet, Rfl: 2   Magnesium 400 MG TABS, Take by mouth., Disp: , Rfl:    Multiple Vitamins-Minerals (CENTRUM SILVER 50+WOMEN PO), Take by mouth. 1 per day, Disp: , Rfl:    potassium chloride SA (KLOR-CON) 20 MEQ tablet, Take 1 tablet (20 mEq total) by mouth daily as needed (when taking Torsemide)., Disp: 90 tablet, Rfl: 1   Semaglutide-Weight Management (WEGOVY) 0.5 MG/0.5ML SOAJ, Inject 0.5 mg into the skin once a week., Disp: 2 mL, Rfl: 1   simvastatin (ZOCOR) 20 MG tablet, Take 1 tablet (20 mg total) by mouth daily., Disp: 90 tablet, Rfl: 0   torsemide (DEMADEX) 20 MG tablet, Take 1 tablet (20 mg total) by mouth daily as needed (for edema)., Disp: 90 tablet, Rfl: 1   No Known Allergies   Review of Systems  Constitutional:  Negative.   Eyes:  Negative for blurred vision.  Respiratory: Negative.  Negative for shortness of breath.   Cardiovascular: Negative.  Negative for chest pain and palpitations.  Gastrointestinal: Negative.   Neurological: Negative.   Psychiatric/Behavioral: Negative.       Today's Vitals   08/31/21 1630  BP: 124/80  Pulse: 81  Temp: 98.2 F (36.8 C)  Weight: 220 lb (99.8 kg)  Height: 5' 0.6" (1.539 m)  PainSc: 0-No pain   Body mass index is 42.12 kg/m.  Wt Readings from Last 3 Encounters:  08/31/21 220 lb (99.8 kg)  07/27/21 233 lb (105.7 kg)  02/21/21 227 lb (103 kg)     Objective:  Physical Exam Vitals and nursing note reviewed.  Constitutional:      Appearance: Normal appearance. She is obese.  HENT:     Head:  Normocephalic and atraumatic.  Eyes:     Extraocular Movements: Extraocular movements intact.  Cardiovascular:     Rate and Rhythm: Normal rate and regular rhythm.     Heart sounds: Normal heart sounds.  Pulmonary:     Effort: Pulmonary effort is normal.     Breath sounds: Normal breath sounds.  Musculoskeletal:     Cervical back: Normal range of motion.  Skin:    General: Skin is warm.  Neurological:     General: No focal deficit present.     Mental Status: She is alert.  Psychiatric:        Mood and Affect: Mood normal.        Behavior: Behavior normal.       Assessment And Plan:     1. Class 3 severe obesity due to excess calories with serious comorbidity and body mass index (BMI) of 40.0 to 44.9 in adult [facility]) Comments: BMI 42. She was congratulated on her 13lb weight loss in six weeks. She will c/w semaglutide 0.'5mg'$  weekly, rx CBULAG sent to her pharmacy. F/u 6 mos.   2. Parenchymal renal hypertension, stage 1 through stage 4 or unspecified chronic kidney disease Comments: Chronic, well controlled. She will c/w current meds - amlodipine '5mg'$  and lisinopril '20mg'$  daily.   3. Chronic renal disease, stage II Comments: Chronic, this has been stable. I will recheck at her next visit.   4. Peripheral vascular disease ([place]) Comments: Chronic, she is encouraged to increase activity as tolerated.    Patient was given opportunity to ask questions. Patient verbalized understanding of the plan and was able to repeat key elements of the plan. All questions were answered to their satisfaction.   I, [name], MD, have reviewed all documentation for this visit. The documentation on 08/31/21 for the exam, diagnosis, procedures, and orders are all accurate and complete.   IF YOU HAVE BEEN REFERRED TO A SPECIALIST, IT MAY TAKE 1-2 WEEKS TO SCHEDULE/PROCESS THE REFERRAL. IF YOU HAVE NOT HEARD FROM US/SPECIALIST IN TWO WEEKS, PLEASE GIVE [name] A CALL AT [phone].   THE PATIENT IS  ENCOURAGED TO PRACTICE SOCIAL DISTANCING DUE TO THE COVID-19 PANDEMIC.

## 2021-09-04 ENCOUNTER — Other Ambulatory Visit (HOSPITAL_COMMUNITY): Payer: Self-pay

## 2021-09-04 ENCOUNTER — Encounter (HOSPITAL_BASED_OUTPATIENT_CLINIC_OR_DEPARTMENT_OTHER): Payer: 59 | Admitting: General Surgery

## 2021-09-04 DIAGNOSIS — I87332 Chronic venous hypertension (idiopathic) with ulcer and inflammation of left lower extremity: Secondary | ICD-10-CM | POA: Diagnosis not present

## 2021-09-04 DIAGNOSIS — L97828 Non-pressure chronic ulcer of other part of left lower leg with other specified severity: Secondary | ICD-10-CM | POA: Diagnosis not present

## 2021-09-04 NOTE — Progress Notes (Signed)
SAIDY, ORMAND ([id number]) Visit Report for 09/04/2021 Arrival Information Details Patient Name: Date of Service: [name], [name] 09/04/2021 1:15 PM Medical Record Number: [id number] Patient Account Number: [id number] Date of Birth/Sex: Treating RN: Aug 03, 1955 (66 y.o. [name] Primary Care Teena Mangus: [name] Other Clinician: Referring Moncerrath Berhe: Treating Nyssa Sayegh/Extender: [name] in Treatment: 27 Visit Information History Since Last Visit Added or deleted any medications: No Patient Arrived: Cane Any new allergies or adverse reactions: No Arrival Time: 13:37 Had a fall or experienced change in No Accompanied By: self activities of daily living that may affect Transfer Assistance: None risk of falls: Patient Identification Verified: Yes Signs or symptoms of abuse/neglect since last visito No Patient Requires Transmission-Based Precautions: No Hospitalized since last visit: No Patient Has Alerts: No Implantable device outside of the clinic excluding No cellular tissue based products placed in the center since last visit: Has Dressing in Place as Prescribed: Yes Has Compression in Place as Prescribed: Yes Pain Present Now: No Electronic Signature(s) Signed: 09/04/2021 5:35:13 PM By: [name] RN Entered By: [name] on 09/04/2021 13:39:31 -------------------------------------------------------------------------------- Compression Therapy Details Patient Name: Date of Service: [name], [name]. 09/04/2021 1:15 PM Medical Record Number: [id number] Patient Account Number: [id number] Date of Birth/Sex: Treating RN: August 18, 1955 (66 y.o. [name] Primary Care Quinten Allerton: [name] Other Clinician: Referring Kerby Hockley: Treating Levar Fayson/Extender: [name] in Treatment: 27 Compression Therapy Performed for Wound Assessment: Wound #3 Left,Lateral Lower Leg Performed By:  Clinician [name], RN Compression Type: Double Layer Post Procedure Diagnosis Same as Pre-procedure Electronic Signature(s) Signed: 09/04/2021 5:35:13 PM By: [name] RN Entered By: [name] on 09/04/2021 14:02:50 -------------------------------------------------------------------------------- Encounter Discharge Information Details Patient Name: Date of Service: Erin Face D. 09/04/2021 1:15 PM Medical Record Number: [id number] Patient Account Number: [id number] Date of Birth/Sex: Treating RN: Sep 14, 1955 (66 y.o. [name] Primary Care Lelaina Oatis: [name] Other Clinician: Referring Urie Loughner: Treating Daleisa Halperin/Extender: [name] in Treatment: 27 Encounter Discharge Information Items Discharge Condition: Stable Ambulatory Status: Cane Discharge Destination: Home Transportation: Private Auto Accompanied By: self Schedule Follow-up Appointment: Yes Clinical Summary of Care: Patient Declined Electronic Signature(s) Signed: 09/04/2021 5:35:13 PM By: [name] RN Entered By: [name] on 09/04/2021 14:33:35 -------------------------------------------------------------------------------- Lower Extremity Assessment Details Patient Name: Date of Service: [name], [name] 09/04/2021 1:15 PM Medical Record Number: [id number] Patient Account Number: [id number] Date of Birth/Sex: Treating RN: 1955-11-30 (66 y.o. [name] Primary Care Dietra Stokely: [name] Other Clinician: Referring Mattison Stuckey: Treating Iolani Twilley/Extender: [name] in Treatment: 27 Edema Assessment Assessed: [Left: No] [Right: No] Edema: [Left: Yes] [Right: Yes] Calf Left: Right: Point of Measurement: 29 cm From Medial Instep 40 cm 42 cm Ankle Left: Right: Point of Measurement: 10 cm From Medial Instep 22.8 cm 26 cm Electronic Signature(s) Signed: 09/04/2021 5:35:13 PM By: [name]  RN Entered By: [name] on 09/04/2021 13:51:06 -------------------------------------------------------------------------------- Multi Wound Chart Details Patient Name: Date of Service: Erin Face D. 09/04/2021 1:15 PM Medical Record Number: [id number] Patient Account Number: [id number] Date of Birth/Sex: Treating RN: 22-Apr-1955 (66 y.o. [name] Primary Care Blessin Kanno: [name] Other Clinician: Referring Urbano Milhouse: Treating Petronella Shuford/Extender: [name] in Treatment: 27 Vital Signs Height(in): 62 Pulse(bpm): 93 Weight(lbs): 226 Blood Pressure(mmHg): 112/76 Body Mass Index(BMI): 41.3 Temperature(F): 98 Respiratory Rate(breaths/min): 16 Photos: [N/A:N/A] Left, Lateral Lower Leg N/A N/A Wound Location: Blister N/A N/A Wounding Event: Venous Leg Ulcer N/A  N/A Primary Etiology: Cataracts, Lymphedema, N/A N/A Comorbid History: Hypertension, Peripheral Venous Disease, Osteoarthritis 12/13/2020 N/A N/A Date Acquired: 79 N/A N/A Weeks of Treatment: Open N/A N/A Wound Status: No N/A N/A Wound Recurrence: 0.3x0.3x0.1 N/A N/A Measurements L x W x D (cm) 0.071 N/A N/A A (cm) : rea 0.007 N/A N/A Volume (cm) : 99.80% N/A N/A % Reduction in Area: 99.90% N/A N/A % Reduction in Volume: Full Thickness Without Exposed N/A N/A Classification: Support Structures Medium N/A N/A Exudate Amount: Serosanguineous N/A N/A Exudate Type: red, brown N/A N/A Exudate Color: Distinct, outline attached N/A N/A Wound Margin: Small (1-33%) N/A N/A Granulation Amount: Pink N/A N/A Granulation Quality: Large (67-100%) N/A N/A Necrotic Amount: Fascia: No N/A N/A Exposed Structures: Fat Layer (Subcutaneous Tissue): No Tendon: No Muscle: No Joint: No Bone: No Medium (34-66%) N/A N/A Epithelialization: Compression Therapy N/A N/A Procedures Performed: Treatment Notes Electronic Signature(s) Signed: 09/04/2021 2:26:05 PM  By: Erin Maudlin MD FACS Signed: 09/04/2021 5:35:13 PM By: [name] RN Entered By: [name] on 09/04/2021 14:26:05 -------------------------------------------------------------------------------- Multi-Disciplinary Care Plan Details Patient Name: Date of Service: Erin Face D. 09/04/2021 1:15 PM Medical Record Number: [id number] Patient Account Number: [id number] Date of Birth/Sex: Treating RN: Oct 20, 1955 (66 y.o. [name] Primary Care : Other Clinician: [name] Referring : Treating /Extender: [name] in Treatment: 27 Multidisciplinary Care Plan reviewed with physician Active Inactive Venous Leg Ulcer Nursing Diagnoses: Actual venous Insuffiency (use after diagnosis is confirmed) Goals: Patient will maintain optimal edema control Date Initiated: 02/21/2021 Target Resolution Date: 10/12/2021 Goal Status: Active Patient/caregiver will verbalize understanding of disease process and disease management Date Initiated: 02/21/2021 Date Inactivated: 03/29/2021 Target Resolution Date: 03/24/2021 Goal Status: Met Interventions: Assess peripheral edema status every visit. Compression as ordered Provide education on venous insufficiency Notes: Wound/Skin Impairment Nursing Diagnoses: Impaired tissue integrity Knowledge deficit related to ulceration/compromised skin integrity Goals: Patient/caregiver will verbalize understanding of skin care regimen Date Initiated: 02/21/2021 Target Resolution Date: 10/12/2021 Goal Status: Active Ulcer/skin breakdown will have a volume reduction of 30% by week 4 Date Initiated: 02/21/2021 Date Inactivated: 03/29/2021 Target Resolution Date: 03/24/2021 Goal Status: Unmet Unmet Reason: infection Ulcer/skin breakdown will have a volume reduction of 50% by week 8 Date Initiated: 03/29/2021 Date Inactivated: 04/19/2021 Target Resolution Date: 04/21/2021 Goal Status:  Met Interventions: Assess patient/caregiver ability to obtain necessary supplies Assess patient/caregiver ability to perform ulcer/skin care regimen upon admission and as needed Assess ulceration(s) every visit Provide education on ulcer and skin care Notes: Electronic Signature(s) Signed: 09/04/2021 5:35:13 PM By: [name] RN Entered By: [name] on 09/04/2021 14:28:37 -------------------------------------------------------------------------------- Pain Assessment Details Patient Name: Date of Service: TAELA, CHARBONNEAU D. 09/04/2021 1:15 PM Medical Record Number: [id number] Patient Account Number: [id number] Date of Birth/Sex: Treating RN: November 24, 1955 (66 y.o. [name] Primary Care : [name] Other Clinician: Referring : Treating /Extender: [name] in Treatment: 27 Active Problems Location of Pain Severity and Description of Pain Patient Has Paino No Site Locations Pain Management and Medication Current Pain Management: Electronic Signature(s) Signed: 09/04/2021 5:35:13 PM By: [name] RN Entered By: [name] on 09/04/2021 13:40:23 -------------------------------------------------------------------------------- Patient/Caregiver Education Details Patient Name: Date of Service: [name] 7/24/2023andnbsp1:15 PM Medical Record Number: [id number] Patient Account Number: [id number] Date of Birth/Gender: Treating RN: 1955-11-21 (66 y.o. [name] Primary Care Physician: [name] Other Clinician: Referring Physician: Treating Physician/Extender: [name] in Treatment: 27 Education Assessment Education Provided  To: Patient Education Topics Provided Venous: Methods: Explain/Verbal Responses: Return demonstration correctly Electronic Signature(s) Signed: 09/04/2021 5:35:13 PM By: [name] RN Entered By: [name]  on 09/04/2021 14:32:15 -------------------------------------------------------------------------------- Wound Assessment Details Patient Name: Date of Service: JIMMY, STIPES D. 09/04/2021 1:15 PM Medical Record Number: [id number] Patient Account Number: [id number] Date of Birth/Sex: Treating RN: 13-Sep-1955 (66 y.o. [name] Primary Care Carlinda Ohlson: [name] Other Clinician: Referring Safiatou Islam: Treating Ryson Bacha/Extender: [name] in Treatment: 27 Wound Status Wound Number: 3 Primary Venous Leg Ulcer Etiology: Wound Location: Left, Lateral Lower Leg Wound Open Wounding Event: Blister Status: Date Acquired: 12/13/2020 Comorbid Cataracts, Lymphedema, Hypertension, Peripheral Venous Weeks Of Treatment: 27 History: Disease, Osteoarthritis Clustered Wound: No Photos Wound Measurements Length: (cm) 0.3 Width: (cm) 0.3 Depth: (cm) 0.1 Area: (cm) 0.071 Volume: (cm) 0.007 % Reduction in Area: 99.8% % Reduction in Volume: 99.9% Epithelialization: Medium (34-66%) Tunneling: No Undermining: No Wound Description Classification: Full Thickness Without Exposed Support Structures Wound Margin: Distinct, outline attached Exudate Amount: Medium Exudate Type: Serosanguineous Exudate Color: red, brown Foul Odor After Cleansing: No Slough/Fibrino Yes Wound Bed Granulation Amount: Small (1-33%) Exposed Structure Granulation Quality: Pink Fascia Exposed: No Necrotic Amount: Large (67-100%) Fat Layer (Subcutaneous Tissue) Exposed: No Necrotic Quality: Adherent Slough Tendon Exposed: No Muscle Exposed: No Joint Exposed: No Bone Exposed: No Treatment Notes Wound #3 (Lower Leg) Wound Laterality: Left, Lateral Cleanser Soap and Water Discharge Instruction: May shower and wash wound with dial antibacterial soap and water prior to dressing change. Wound Cleanser Discharge Instruction: Cleanse the wound with wound cleanser prior to applying  a clean dressing using gauze sponges, not tissue or cotton balls. Peri-Wound Care Sween Lotion (Moisturizing lotion) Discharge Instruction: Apply moisturizing lotion as directed Topical Mupirocin Ointment Discharge Instruction: Apply Mupirocin (Bactroban) thin layer to wound bed Primary Dressing KerraCel Ag Gelling Fiber Dressing, 4x5 in (silver alginate) Discharge Instruction: Apply silver alginate to wound bed as instructed Secondary Dressing Zetuvit Plus 4x8 in Discharge Instruction: Apply over primary dressing as directed. Secured With Compression Wrap CoFlex TLC XL 2-layer Compression System 4x7 (in/yd) Discharge Instruction: Apply CoFlex 2-layer compression as directed. (alt for 4 layer) Compression Stockings Add-Ons Electronic Signature(s) Signed: 09/04/2021 5:35:13 PM By: [name] RN Entered By: [name] on 09/04/2021 13:56:29 -------------------------------------------------------------------------------- Vitals Details Patient Name: Date of Service: [name] 09/04/2021 1:15 PM Medical Record Number: [id number] Patient Account Number: [id number] Date of Birth/Sex: Treating RN: 1955/07/06 (66 y.o. [name] Primary Care Valmore Arabie: [name] Other Clinician: Referring Karim Aiello: Treating Christon Gallaway/Extender: [name] in Treatment: 27 Vital Signs Time Taken: 13:39 Temperature (F): 98 Height (in): 62 Pulse (bpm): 93 Weight (lbs): 226 Respiratory Rate (breaths/min): 16 Body Mass Index (BMI): 41.3 Blood Pressure (mmHg): 112/76 Reference Range: 80 - 120 mg / dl Electronic Signature(s) Signed: 09/04/2021 5:35:13 PM By: [name] RN Entered By: [name] on 09/04/2021 13:40:16

## 2021-09-04 NOTE — Progress Notes (Signed)
[name], [name] ([id number]) Visit Report for 09/04/2021 Chief Complaint Document Details Patient Name: Date of Service: [name] 09/04/2021 1:15 PM Medical Record Number: [id number] Patient Account Number: [id number] Date of Birth/Sex: Treating RN: 1955/05/19 (66 y.o. [name] Primary Care Provider: Glendale [name] Other Clinician: Referring Provider: Treating Provider/Extender: [name] in Treatment: 27 Information Obtained from: Patient Chief Complaint 04/19/2021: The patient is here for ongoing follow-up regarding 2 left lower extremity wounds. Electronic Signature(s) Signed: 09/04/2021 2:26:12 PM By: [name] MD FACS Entered By: [name] on 09/04/2021 14:26:12 -------------------------------------------------------------------------------- HPI Details Patient Name: Date of Service: [name] 09/04/2021 1:15 PM Medical Record Number: [id number] Patient Account Number: [id number] Date of Birth/Sex: Treating RN: 16-Dec-1955 (66 y.o. 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 She tells [name] that she fell on the gravel driveway in July. She developed injuries on her distal lower leg which have not healed. She saw her primary physician on 11/15/2017 who noted her left shin injuries. Gave her antibiotics. At that point the wounds were almost circumferential however most were less than 1.5 cm. Weeping edema fluid was noted. She was referred here for evaluation. The patient has a history of chronic lower extremity edema. 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 She has had prior history with leg weeping fluid. She does not wear compression stockings. She is not doing anything specific to these wound areas. 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 She also has lymphedema. Put her in 3 layer compression and silver alginate last week. She has 3 small wounds with depth just lateral to the tibia. More problematically than this she has numerous shallow areas some of which are almost canal like in shape with tightly adherent painful debris. It would be very difficult and time- consuming to go through this and attempt to individually debride all these areas.. I changed her to collagen today to see if that would help with any of the surface debris on some of these wounds. Otherwise we will not be able to put this in compression we have to have someone change the dressing. The patient is not eligible for home health 12/25/17 on evaluation today patient actually appears to be doing rather well in regard to the ulcer on her lower extremity. Fortunately there does not appear to be evidence of infection at this time. She has been tolerating the dressing changes without complication. This includes the compression wrap. The only issue she had was that the wrap was initially placed over her bunion region which actually calls her some discomfort and pain. Other than that things seem to be going rather well. 01/01/2018 Seen today for follow-up and management of left lower extremity wound and lymphedema. T oday she presents with a new wound towards to the left lateral LE. Recently treated with a 7 day course of amoxicillin; reason for antibiotic dose is unknown at this time.  Tolerating  current treatment of collagen with 4- layer wraps. She obtained a venous reflux study on 12/25/17. Studies show on the right abnormal reflux times of the popliteal vein, great saphenous vein at the saphenofemoral junction at the proximal thigh, great saphenous vein at the mid calf, and origin of the small saphenous vein.No superficial thrombosis. No deep vein thrombosis in the common femoral, femoral,and popliteal veins. Left abnormal reflex times as well of the common femoral vein, popliteal vein, and a great saphenous vein at the saphenofemoral junction, In great saphenous vein at the mid thigh w/o thrombosis. Has any issues or concerns during visit today. Recommended follow-up to vascular specialist due to abnormalities from the venous reflux study. Denies fever, pain, chills, dizziness, nausea, or vomiting. 01/08/18 upon evaluation today the patient actually seems to be showing some signs of improvement in my opinion at this point in regard to the lower extremity ulcerated areas. She still has a lot of drainage but fortunately nothing that appears to be too significant currently. I have been very happy with the overall progress I see today compared to where things were during the last evaluation that I had with her. Nonetheless she has not had her appointment with the vein specialist as of yet in fact we were able to get this approved for her today and confirmed with them she will be seeing them on December 26. Nonetheless in general I do feel like the compression wraps is doing well for her. 01/17/18; quite a bit of improvement since last time I saw this patient she has a small open area remaining on the left lateral calf and even smaller area medially. She has lymphedema chronic stasis changes with distal skin fibrosis. She has an appointment with vascular surgery later this month 01/24/2018; the patient's medial leg has closed. Still a small open area on the left lateral leg. She  states that the 4 layer compression we put on last week was too tight and she had to take it off over a few days ago. We have had resultant increase in her lymphedema in the dorsal foot and a proximal calf. Fortunately that does not seem to have resulted in any deterioration in her wounds The patient is going to need compression stockings. We have given her measurements to phone elastic therapy in [place]. She has vascular surgery consult on December 26 02/07/18; she's had continuous contraction on the left lateral leg wound which is now very small. Currently using silver alginate under 3 layer compression She saw Dr. Trula Slade of vascular surgery on 02/06/18. It was noted that she had a normal reflux times in the popliteal vein, great saphenous vein at the saphenofemoral junction, great saphenous vein at the proximal thigh great saphenous vein at the mid calf and origin of the small saphenous vein. It was noted that she had significant reflux in the left saphenous veins with diameter measurements in the 0.7-0.8 cm range. It was felt she would benefit from laser ablation to help minimize the risk of ulcer recurrence. It was recommended that she wear 20-30 thigh-high compression stockings and have follow-up in 4-6 weeks 02/17/2018; I thought this lady would be healed however her compression slipped down and she developed increasing swelling and the wound is actually larger. 1/13; we had deterioration last week after the patient's compression slipped down and she developed periwound swelling. I increased her compression before layers we have been using silver alginate we are a lot better again today. She has her compression stockings in waiting  1/23; the patient's wounds are totally healed today. She has her stockings. This was almost circumferential skin damage. She follows up with Dr. Trula Slade of vascular surgery next Monday. READMISSION 02/21/2021 This is a now 66 year old woman that we had in clinic  here discharging in January 2020 with wounds on her left calf chronic venous insufficiency. She was discharged with 30/40 stockings. It does not sound like she has worn stockings in about a year largely from not being able to get them on herself. In November she developed new blisters on her legs an area laterally is opened into a fairly sizable wound. She has weeping posteriorly as well. She has been using Neosporin and Band-Aids. The patient did see Dr. Trula Slade in 2019 and 2020. He felt she might benefit from laser ablation in her left saphenous veins although because of the wound that healed I do not think he went through with it. She might benefit from seeing him again. Her ABI on the left is 1. 1/17; patient's wound on the posterior left calf is closed she has the 2 large areas last week. We put her in 4-layer compression for edema control is a lot better. Our intake nurse noted greenish drainage and odor. We have been using silver alginate 1/25; PCR culture I did have the substantial wound area on the left lateral lower leg showed staff aureus and group A strep. Low titers of coag negative staph which are probably skin contaminants. Resistance detected to tetracycline methicillin and macrolides. I gave her a starter kit of Nuzyra 150 mg x 3 for 2 days then 300 mg for a further 7 days. Marked odor considerable increase in surrounding erythema. We are using Iodoflex last week however I have changed to silver alginate with underlying Bactroban 2/1; she is completing her [place] tomorrow. The degree of erythema around the wounds looks a lot better. Odor has improved. I gave her silver alginate and Bactroban last week and changing her back to Iodoflex to continue with ongoing debridement 2/8; the periwound looks a lot better. Surface of the wound also looks somewhat better although there is still ongoing debridement to be done we have been using Iodoflex under compression. Primary dressing and silver  alginate 2/15; left posterior calf. Some improvement in the surface of the wound but still very gritty we have been using Iodoflex under compression. 04/05/2021: Left lateral posterior calf. She continues to have significant amounts of drainage, some of which is probably comprised of the Iodoflex microbleeds. There is no odor to the drainage. The satellite lesion on the more posterior aspect of the calf is epithelializing nicely and has contracted quite a bit. There is robust granulation tissue in the dominant wound with minimal adherent slough. 04/12/2021: The satellite lesion has nearly closed. She continues to have good granulation tissue with minimal slough of the larger primary wound. She continues to have a fair amount of drainage, but I think this is less secondary to switching her dressing from Iodoflex to Prisma. 04/19/2021: The satellite lesion is almost completely epithelialized. The larger primary wound continues to contract with good granulation tissue and minimal slough. She is currently in [place] with compression. 04/26/2021: The satellite lesion has closed. The larger primary wound has contracted further and the granulation tissue is robust without being hypertrophic. Minimal slough is present. 05/03/2021: The satellite lesion remains closed. The larger primary wound has a bit of slough, but has also contracted further with good perimeter epithelialization. Good granulation tissue at the wound surface. There  was some greenish drainage on the dressing when it was removed; it is not the blue-green typically associated with Pseudomonas aeruginosa, however. 05/10/2021: The primary wound continues to contract. There is minimal slough. The granulation tissue at 9:00 is a bit hypertrophic. No significant drainage. No odor. 05/17/2021: The wound is a little bit smaller today with good granulation tissue. Minimal slough. No significant drainage or odor. 05/24/2021: The wound continues to contract and  has a nice base of granulation tissue. Small amount of slough. No concern for infection. 05/31/2021: For some reason, the wound measured slightly larger today but overall it still appears to be in good condition with a nice base of granulation tissue and minimal slough. 06/07/2021: The wound is smaller today. Good granulation tissue and minimal slough. 06/14/2021: The wound is unchanged in size. She continues to accumulate some slough. Good granulation tissue on the surface. 06/20/2021: The wound is smaller today. Minimal slough with good granulation tissue. 06/27/2021: The wound on her left lateral leg is smaller today with just a bit of slough and eschar accumulation. Unfortunately, she has opened a new superficial wound on her right lower extremity. She has 3+ pitting edema to the knees on that leg and she does not wear compression stockings. 07/04/2021: In addition to the superficial wound on her right lower extremity that she opened up last week, she has opened 3 additional sites on the same leg. Her compression wraps were clearly not in correct position when she came to clinic today as they were at about the mid calf level rather than to the tibial tuberosity. She says that they slipped earlier and she had to come back on Friday to have them redone. The wound on her left lateral leg is perhaps slightly larger with some slough accumulation. 07/11/2021: The superficial wounds on her right lower extremity are nearly closed. They just have a thin layer of eschar overlying them. The wound on her left lateral leg is a little bit shallower and has a bit of slough accumulation. 07/17/2021: The right medial lower extremity leg wounds have almost completely closed; one of them just has a tiny opening with a little bit of serous drainage. The left lateral leg wound is really unchanged. It seems to be stalled. 07/24/2021: The right leg wounds are completely closed. The left lateral leg wound is actually bigger today.  It is a bit more tender. There is a minor accumulation of slough on the surface. 07/31/2021: The culture that I took last week was positive for MRSA. We added mupirocin under the silver alginate and I prescribed doxycycline. She has been tolerating this well. The wound is smaller today but does have slough accumulation. No significant pain or drainage. 08/07/2021: She completed her course of doxycycline. The wound looks much better today. It is smaller and the periwound is less inflamed. She does have some slough accumulation on the surface. 08/14/2021: The wound continues to contract. There is slough on the wound surface, but the periwound is intact without inflammation or induration. 08/21/2021: The wound is down to just 2 small open sites. They both have a bit of slough accumulation. 08/28/2021: The wound is down to just 1 small open site with a little bit of slough and eschar accumulation. 09/04/2021: The wound continues to contract but remains open. It is clean without any slough. Electronic Signature(s) Signed: 09/04/2021 2:26:41 PM By: [name] MD FACS Entered By: [name] on 09/04/2021 14:26:40 -------------------------------------------------------------------------------- Physical Exam Details Patient Name: Date of Service: Erin Gerold  NITA [name] 09/04/2021 1:15 PM Medical Record Number: [id number] Patient Account Number: [id number] Date of Birth/Sex: Treating RN: 04-May-1955 (66 y.o. [name] Primary Care Provider: Gl[place] [name] Other Clinician: Referring Provider: Treating Provider/Extender: [name] in Treatment: 27 Constitutional . . . . No acute distress.Marland Kitchen Respiratory Normal work of breathing on room air.. Notes 09/04/2021: The wound continues to contract but remains open. It is clean without any slough. Electronic Signature(s) Signed: 09/04/2021 2:27:11 PM By: [name] MD FACS Entered By: [name] on 09/04/2021  14:27:11 -------------------------------------------------------------------------------- Physician Orders Details Patient Name: Date of Service: [name] 09/04/2021 1:15 PM Medical Record Number: [id number] Patient Account Number: [id number] Date of Birth/Sex: Treating RN: Jul 01, 1955 (66 y.o. [name] Primary Care Provider: Glendale [name] Other Clinician: Referring Provider: Treating Provider/Extender: [name] in Treatment: 27 Verbal / Phone Orders: No Diagnosis Coding ICD-10 Coding Code Description 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 (idiopathic) with ulcer and inflammation of left lower extremity L97.828 Non-pressure chronic ulcer of other part of left lower leg with other specified severity Follow-up Appointments ppointment in 1 week. - Dr. Celine Ahr Room 3 Tuesday [name] 1st at 11am Return A Bathing/ Shower/ Hygiene May shower with protection but do not get wound dressing(s) wet. - Ok to use [employer], can purchase at [facility], [facility], or [employer] Edema Control - Lymphedema / SCD / Other Elevate legs to the level of the heart or above for 30 minutes daily and/or when sitting, a frequency of: - throughout the day Avoid standing for long periods of time. Exercise regularly Compression stocking or Garment 20-30 mm/Hg pressure to: - Brochure for Elastic Therapy. Leg measurements included Wound Treatment Wound #3 - Lower Leg Wound Laterality: Left, Lateral Cleanser: Soap and Water 1 x Per Week/30 Days Discharge Instructions: May shower and wash wound with dial antibacterial soap and water prior to dressing change. Cleanser: Wound Cleanser 1 x Per Week/30 Days Discharge Instructions: Cleanse the wound with wound cleanser prior to applying a clean dressing using gauze sponges, not tissue or cotton balls. Peri-Wound Care: Sween Lotion (Moisturizing lotion) 1 x Per Week/30 Days Discharge Instructions: Apply moisturizing lotion as  directed Topical: Mupirocin Ointment 1 x Per Week/30 Days Discharge Instructions: Apply Mupirocin (Bactroban) thin layer to wound bed Prim Dressing: KerraCel Ag Gelling Fiber Dressing, 4x5 in (silver alginate) 1 x Per Week/30 Days ary Discharge Instructions: Apply silver alginate to wound bed as instructed Secondary Dressing: Zetuvit Plus 4x8 in 1 x Per Week/30 Days Discharge Instructions: Apply over primary dressing as directed. Compression Wrap: CoFlex TLC XL 2-layer Compression System 4x7 (in/yd) 1 x Per Week/30 Days Discharge Instructions: Apply CoFlex 2-layer compression as directed. (alt for 4 layer) Electronic Signature(s) Signed: 09/04/2021 2:27:24 PM By: [name] MD FACS Entered By: [name] on 09/04/2021 14:27:23 -------------------------------------------------------------------------------- Problem List Details Patient Name: Date of Service: [name] 09/04/2021 1:15 PM Medical Record Number: [id number] Patient Account Number: [id number] Date of Birth/Sex: Treating RN: 1955-07-16 (66 y.o. [name] Primary Care Provider: Glendale [name] Other Clinician: Referring Provider: Treating Provider/Extender: [name] in Treatment: 27 Active Problems ICD-10 Encounter Code Description Active Date MDM Diagnosis 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 (idiopathic) with ulcer and inflammation of left 02/21/2021 No Yes lower extremity L97.828 Non-pressure chronic ulcer of other part of left lower leg with other specified 02/21/2021 No Yes severity Inactive Problems ICD-10 Code Description Active Date Inactive Date L03.116 Cellulitis of left lower limb 03/08/2021 03/08/2021 L97.811  Non-pressure chronic ulcer of other part of right lower leg limited to breakdown of skin 06/27/2021 06/27/2021 Resolved Problems Electronic Signature(s) Signed: 09/04/2021 2:25:59 PM By: [name] MD FACS Entered By: [name] on  09/04/2021 14:25:59 -------------------------------------------------------------------------------- Progress Note Details Patient Name: Date of Service: [name] 09/04/2021 1:15 PM Medical Record Number: [id number] Patient Account Number: [id number] Date of Birth/Sex: Treating RN: July 22, 1955 (66 y.o. [name] Primary Care Provider: Glendale [name] Other Clinician: Referring Provider: Treating Provider/Extender: [name] in Treatment: 27 Subjective Chief Complaint Information obtained from Patient 04/19/2021: The patient is here for ongoing follow-up regarding 2 left lower extremity wounds. 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 She tells [name] that she fell on the gravel driveway in July. She developed injuries on her distal lower leg which have not healed. She saw her primary physician on 11/15/2017 who noted her left shin injuries. Gave her antibiotics. At that point the wounds were almost circumferential however most were less than 1.5 cm. Weeping edema fluid was noted. She was referred here for evaluation. The patient has a history of chronic lower extremity edema. 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 She has had prior history with leg weeping fluid. She does not wear compression stockings. She is not doing anything specific to these wound areas. 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 She also has lymphedema. Put her in 3 layer compression and silver alginate  last week. She has 3 small wounds with depth just lateral to the tibia. More problematically than this she has numerous shallow areas some of which are almost canal like in shape with tightly adherent painful debris. It would be very difficult and time- consuming to go through this and attempt to individually debride all these areas.. I changed her to collagen today to see if that would help with any of the surface debris on some of these wounds. Otherwise we will not be able to put this in compression we have to have someone change the dressing. The patient is not eligible for home health 12/25/17 on evaluation today patient actually appears to be doing rather well in regard to the ulcer on her lower extremity. Fortunately there does not appear to be evidence of infection at this time. She has been tolerating the dressing changes without complication. This includes the compression wrap. The only issue she had was that the wrap was initially placed over her bunion region which actually calls her some discomfort and pain. Other than that things seem to be going rather well. 01/01/2018 Seen today for follow-up and management of left lower extremity wound and lymphedema. T oday she presents with a new wound towards to the left lateral LE. Recently treated with a 7 day course of amoxicillin; reason for antibiotic dose is unknown at this time. Tolerating current treatment of collagen with 4- layer wraps. She obtained a venous reflux study on 12/25/17. Studies show on the right abnormal reflux times of the popliteal vein, great saphenous vein at the saphenofemoral junction at the proximal thigh, great saphenous vein  at the mid calf, and origin of the small saphenous vein.No superficial thrombosis. No deep vein thrombosis in the common femoral, femoral,and popliteal veins. Left abnormal reflex times as well of the common femoral vein, popliteal vein, and a great saphenous vein at the saphenofemoral junction,  In great saphenous vein at the mid thigh w/o thrombosis. Has any issues or concerns during visit today. Recommended follow-up to vascular specialist due to abnormalities from the venous reflux study. Denies fever, pain, chills, dizziness, nausea, or vomiting. 01/08/18 upon evaluation today the patient actually seems to be showing some signs of improvement in my opinion at this point in regard to the lower extremity ulcerated areas. She still has a lot of drainage but fortunately nothing that appears to be too significant currently. I have been very happy with the overall progress I see today compared to where things were during the last evaluation that I had with her. Nonetheless she has not had her appointment with the vein specialist as of yet in fact we were able to get this approved for her today and confirmed with them she will be seeing them on December 26. Nonetheless in general I do feel like the compression wraps is doing well for her. 01/17/18; quite a bit of improvement since last time I saw this patient she has a small open area remaining on the left lateral calf and even smaller area medially. She has lymphedema chronic stasis changes with distal skin fibrosis. She has an appointment with vascular surgery later this month 01/24/2018; the patient's medial leg has closed. Still a small open area on the left lateral leg. She states that the 4 layer compression we put on last week was too tight and she had to take it off over a few days ago. We have had resultant increase in her lymphedema in the dorsal foot and a proximal calf. Fortunately that does not seem to have resulted in any deterioration in her wounds The patient is going to need compression stockings. We have given her measurements to phone elastic therapy in [place]. She has vascular surgery consult on December 26 02/07/18; she's had continuous contraction on the left lateral leg wound which is now very small. Currently using  silver alginate under 3 layer compression She saw Dr. Trula Slade of vascular surgery on 02/06/18. It was noted that she had a normal reflux times in the popliteal vein, great saphenous vein at the saphenofemoral junction, great saphenous vein at the proximal thigh great saphenous vein at the mid calf and origin of the small saphenous vein. It was noted that she had significant reflux in the left saphenous veins with diameter measurements in the 0.7-0.8 cm range. It was felt she would benefit from laser ablation to help minimize the risk of ulcer recurrence. It was recommended that she wear 20-30 thigh-high compression stockings and have follow-up in 4-6 weeks 02/17/2018; I thought this lady would be healed however her compression slipped down and she developed increasing swelling and the wound is actually larger. 1/13; we had deterioration last week after the patient's compression slipped down and she developed periwound swelling. I increased her compression before layers we have been using silver alginate we are a lot better again today. She has her compression stockings in waiting 1/23; the patient's wounds are totally healed today. She has her stockings. This was almost circumferential skin damage. She follows up with Dr. Trula Slade of vascular surgery next Monday. READMISSION 02/21/2021 This is a now 66 year old woman that we had in  clinic here discharging in January 2020 with wounds on her left calf chronic venous insufficiency. She was discharged with 30/40 stockings. It does not sound like she has worn stockings in about a year largely from not being able to get them on herself. In November she developed new blisters on her legs an area laterally is opened into a fairly sizable wound. She has weeping posteriorly as well. She has been using Neosporin and Band-Aids. The patient did see Dr. Trula Slade in 2019 and 2020. He felt she might benefit from laser ablation in her left saphenous veins although  because of the wound that healed I do not think he went through with it. She might benefit from seeing him again. Her ABI on the left is 1. 1/17; patient's wound on the posterior left calf is closed she has the 2 large areas last week. We put her in 4-layer compression for edema control is a lot better. Our intake nurse noted greenish drainage and odor. We have been using silver alginate 1/25; PCR culture I did have the substantial wound area on the left lateral lower leg showed staff aureus and group A strep. Low titers of coag negative staph which are probably skin contaminants. Resistance detected to tetracycline methicillin and macrolides. I gave her a starter kit of Nuzyra 150 mg x 3 for 2 days then 300 mg for a further 7 days. Marked odor considerable increase in surrounding erythema. We are using Iodoflex last week however I have changed to silver alginate with underlying Bactroban 2/1; she is completing her [place] tomorrow. The degree of erythema around the wounds looks a lot better. Odor has improved. I gave her silver alginate and Bactroban last week and changing her back to Iodoflex to continue with ongoing debridement 2/8; the periwound looks a lot better. Surface of the wound also looks somewhat better although there is still ongoing debridement to be done we have been using Iodoflex under compression. Primary dressing and silver alginate 2/15; left posterior calf. Some improvement in the surface of the wound but still very gritty we have been using Iodoflex under compression. 04/05/2021: Left lateral posterior calf. She continues to have significant amounts of drainage, some of which is probably comprised of the Iodoflex microbleeds. There is no odor to the drainage. The satellite lesion on the more posterior aspect of the calf is epithelializing nicely and has contracted quite a bit. There is robust granulation tissue in the dominant wound with minimal adherent slough. 04/12/2021: The  satellite lesion has nearly closed. She continues to have good granulation tissue with minimal slough of the larger primary wound. She continues to have a fair amount of drainage, but I think this is less secondary to switching her dressing from Iodoflex to Prisma. 04/19/2021: The satellite lesion is almost completely epithelialized. The larger primary wound continues to contract with good granulation tissue and minimal slough. She is currently in [place] with compression. 04/26/2021: The satellite lesion has closed. The larger primary wound has contracted further and the granulation tissue is robust without being hypertrophic. Minimal slough is present. 05/03/2021: The satellite lesion remains closed. The larger primary wound has a bit of slough, but has also contracted further with good perimeter epithelialization. Good granulation tissue at the wound surface. There was some greenish drainage on the dressing when it was removed; it is not the blue-green typically associated with Pseudomonas aeruginosa, however. 05/10/2021: The primary wound continues to contract. There is minimal slough. The granulation tissue at 9:00 is a bit  hypertrophic. No significant drainage. No odor. 05/17/2021: The wound is a little bit smaller today with good granulation tissue. Minimal slough. No significant drainage or odor. 05/24/2021: The wound continues to contract and has a nice base of granulation tissue. Small amount of slough. No concern for infection. 05/31/2021: For some reason, the wound measured slightly larger today but overall it still appears to be in good condition with a nice base of granulation tissue and minimal slough. 06/07/2021: The wound is smaller today. Good granulation tissue and minimal slough. 06/14/2021: The wound is unchanged in size. She continues to accumulate some slough. Good granulation tissue on the surface. 06/20/2021: The wound is smaller today. Minimal slough with good granulation  tissue. 06/27/2021: The wound on her left lateral leg is smaller today with just a bit of slough and eschar accumulation. Unfortunately, she has opened a new superficial wound on her right lower extremity. She has 3+ pitting edema to the knees on that leg and she does not wear compression stockings. 07/04/2021: In addition to the superficial wound on her right lower extremity that she opened up last week, she has opened 3 additional sites on the same leg. Her compression wraps were clearly not in correct position when she came to clinic today as they were at about the mid calf level rather than to the tibial tuberosity. She says that they slipped earlier and she had to come back on Friday to have them redone. The wound on her left lateral leg is perhaps slightly larger with some slough accumulation. 07/11/2021: The superficial wounds on her right lower extremity are nearly closed. They just have a thin layer of eschar overlying them. The wound on her left lateral leg is a little bit shallower and has a bit of slough accumulation. 07/17/2021: The right medial lower extremity leg wounds have almost completely closed; one of them just has a tiny opening with a little bit of serous drainage. The left lateral leg wound is really unchanged. It seems to be stalled. 07/24/2021: The right leg wounds are completely closed. The left lateral leg wound is actually bigger today. It is a bit more tender. There is a minor accumulation of slough on the surface. 07/31/2021: The culture that I took last week was positive for MRSA. We added mupirocin under the silver alginate and I prescribed doxycycline. She has been tolerating this well. The wound is smaller today but does have slough accumulation. No significant pain or drainage. 08/07/2021: She completed her course of doxycycline. The wound looks much better today. It is smaller and the periwound is less inflamed. She does have some slough accumulation on the  surface. 08/14/2021: The wound continues to contract. There is slough on the wound surface, but the periwound is intact without inflammation or induration. 08/21/2021: The wound is down to just 2 small open sites. They both have a bit of slough accumulation. 08/28/2021: The wound is down to just 1 small open site with a little bit of slough and eschar accumulation. 09/04/2021: The wound continues to contract but remains open. It is clean without any slough. Patient History Information obtained from Patient. Family History Heart Disease - Mother,Father, Hypertension - Mother,Father, Kidney Disease - Mother, Thyroid Problems - Mother, No family history of Cancer, Diabetes, Hereditary Spherocytosis, Lung Disease, Seizures, Stroke, Tuberculosis. Social History Never smoker, Marital Status - Single, Alcohol Use - Never, Drug Use - No History, Caffeine Use - Daily - coffee. Medical History Eyes Patient has history of Cataracts Hematologic/Lymphatic Patient has  history of Lymphedema Cardiovascular Patient has history of Hypertension, Peripheral Venous Disease Integumentary (Skin) Denies history of History of Burn Musculoskeletal Patient has history of Osteoarthritis Hospitalization/Surgery History - colostomy reversal. 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 Medical A Surgical History Notes nd Constitutional Symptoms (General Health) morbid obesity Cardiovascular schamberg disease, hyperlipidemia Gastrointestinal diverticulitis , h/o obstruction due to diverticulitis , colostomy and colostomy reversal Endocrine hypothyroidism Objective Constitutional No acute distress.. Vitals Time Taken: 1:39 PM, Height: 62 in, Weight: 226 lbs, BMI: 41.3, Temperature: 98 F, Pulse: 93 bpm, Respiratory Rate: 16 breaths/min, Blood Pressure: 112/76 mmHg. Respiratory Normal work of breathing on room air.. General Notes: 09/04/2021: The wound continues to contract but remains open. It is clean without any  slough. Integumentary (Hair, Skin) Wound #3 status is Open. Original cause of wound was Blister. The date acquired was: 12/13/2020. The wound has been in treatment 27 weeks. The wound is located on the Left,Lateral Lower Leg. The wound measures 0.3cm length x 0.3cm width x 0.1cm depth; 0.071cm^2 area and 0.007cm^3 volume. There is no tunneling or undermining noted. There is a medium amount of serosanguineous drainage noted. The wound margin is distinct with the outline attached to the wound base. There is small (1-33%) pink granulation within the wound bed. There is a large (67-100%) amount of necrotic tissue within the wound bed including Adherent Slough. Assessment Active Problems ICD-10 Chronic venous hypertension (idiopathic) with ulcer and inflammation of left lower extremity Non-pressure chronic ulcer of other part of left lower leg with other specified severity Procedures Wound #3 Pre-procedure diagnosis of Wound #3 is a Venous Leg Ulcer located on the Left,Lateral Lower Leg . There was a Double Layer Compression Therapy Procedure by [name], RN. Post procedure Diagnosis Wound #3: Same as Pre-Procedure Plan Follow-up Appointments: Return Appointment in 1 week. - Dr. Celine Ahr Room 3 Tuesday [name] 1st at [facility] Hygiene: May shower with protection but do not get wound dressing(s) wet. - Ok to use Market [employer], can purchase at [facility], [facility], or [employer] Edema Control - Lymphedema / SCD / Other: Elevate legs to the level of the heart or above for 30 minutes daily and/or when sitting, a frequency of: - throughout the day Avoid standing for long periods of time. Exercise regularly Compression stocking or Garment 20-30 mm/Hg pressure to: - Brochure for Elastic Therapy. Leg measurements included WOUND #3: - Lower Leg Wound Laterality: Left, Lateral Cleanser: Soap and Water 1 x Per Week/30 Days Discharge Instructions: May shower and wash wound with dial antibacterial  soap and water prior to dressing change. Cleanser: Wound Cleanser 1 x Per Week/30 Days Discharge Instructions: Cleanse the wound with wound cleanser prior to applying a clean dressing using gauze sponges, not tissue or cotton balls. Peri-Wound Care: Sween Lotion (Moisturizing lotion) 1 x Per Week/30 Days Discharge Instructions: Apply moisturizing lotion as directed Topical: Mupirocin Ointment 1 x Per Week/30 Days Discharge Instructions: Apply Mupirocin (Bactroban) thin layer to wound bed Prim Dressing: KerraCel Ag Gelling Fiber Dressing, 4x5 in (silver alginate) 1 x Per Week/30 Days ary Discharge Instructions: Apply silver alginate to wound bed as instructed Secondary Dressing: Zetuvit Plus 4x8 in 1 x Per Week/30 Days Discharge Instructions: Apply over primary dressing as directed. Com pression Wrap: CoFlex TLC XL 2-layer Compression System 4x7 (in/yd) 1 x Per Week/30 Days Discharge Instructions: Apply CoFlex 2-layer compression as directed. (alt for 4 layer) 09/04/2021: The wound continues to contract but remains open. It is clean without any slough. No debridement was necessary today.  We will continue mupirocin and silver alginate with Coflex compression. Follow-up in 1 week. Electronic Signature(s) Signed: 09/04/2021 2:30:53 PM By: [name] MD FACS Previous Signature: 09/04/2021 2:30:04 PM Version By: [name] MD FACS Entered By: [name] on 09/04/2021 14:30:53 -------------------------------------------------------------------------------- HxROS Details Patient Name: Date of Service: [name] 09/04/2021 1:15 PM Medical Record Number: [id number] Patient Account Number: [id number] Date of Birth/Sex: Treating RN: January 09, 1956 (66 y.o. [name] Primary Care Provider: [place] [name] Other Clinician: Referring Provider: Treating Provider/Extender: [name] in Treatment: 27 Information Obtained  From Patient Constitutional Symptoms (General Health) Medical History: Past Medical History Notes: morbid obesity Eyes Medical History: Positive for: Cataracts Hematologic/Lymphatic Medical History: Positive for: Lymphedema Cardiovascular Medical History: Positive for: Hypertension; Peripheral Venous Disease Past Medical History Notes: schamberg disease, hyperlipidemia Gastrointestinal Medical History: Past Medical History Notes: diverticulitis , h/o obstruction due to diverticulitis , colostomy and colostomy reversal Endocrine Medical History: Past Medical History Notes: hypothyroidism Integumentary (Skin) Medical History: Negative for: History of Burn Musculoskeletal Medical History: Positive for: Osteoarthritis HBO Extended History Items Eyes: Cataracts Immunizations Pneumococcal Vaccine: Received Pneumococcal Vaccination: Yes Received Pneumococcal Vaccination On or After 60th Birthday: Yes Implantable Devices None Hospitalization / Surgery History Type of Hospitalization/Surgery colostomy reversal 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 Family and Social History Cancer: No; Diabetes: No; Heart Disease: Yes - Mother,Father; Hereditary Spherocytosis: No; Hypertension: Yes - Mother,Father; Kidney Disease: Yes - Mother; Lung Disease: No; Seizures: No; Stroke: No; Thyroid Problems: Yes - Mother; Tuberculosis: No; Never smoker; Marital Status - Single; Alcohol Use: Never; Drug Use: No History; Caffeine Use: Daily - coffee; Financial Concerns: No; Food, Clothing or Shelter Needs: No; Support System Lacking: No; Transportation Concerns: No Electronic Signature(s) Signed: 09/04/2021 3:41:11 PM By: [name] MD FACS Signed: 09/04/2021 5:35:13 PM By: [name] RN Entered By: [name] on 09/04/2021 14:26:46 -------------------------------------------------------------------------------- [place] Details Patient Name: Date of Service: [name]  09/04/2021 Medical Record Number: [id number] Patient Account Number: [id number] Date of Birth/Sex: Treating RN: [birth year] (66 y.o. [name] Primary Care Provider: [place] [name] Other Clinician: Referring Provider: Treating Provider/Extender: [name] in Treatment: 27 Diagnosis Coding ICD-10 Codes Code Description [phone] Chronic venous hypertension (idiopathic) with ulcer and inflammation of left lower extremity L97.828 Non-pressure chronic ulcer of other part of left lower leg with other specified severity Facility Procedures CPT4 Code: [id number] Description: (Facility Use Only) [phone] - [facility] COMPRS LWR LT LEG Modifier: Quantity: 1 Physician Procedures : CPT4 Code Description Modifier [id number] - WC PHYS LEVEL 3 - EST PT ICD-10 Diagnosis Description 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 (idiopathic) with ulcer and inflammation of left lower extremity L97.828 Non-pressure chronic ulcer of other part  of left lower leg with other specified severity Quantity: 1 Electronic Signature(s) Signed: 09/04/2021 3:41:11 PM By: [name] MD FACS Signed: 09/04/2021 5:35:13 PM By: [name] RN Previous Signature: 09/04/2021 2:30:14 PM Version By: [name] MD FACS Entered By: [name] on 09/04/2021 14:33:02

## 2021-09-05 ENCOUNTER — Encounter: Payer: Self-pay | Admitting: Internal Medicine

## 2021-09-07 ENCOUNTER — Other Ambulatory Visit (HOSPITAL_COMMUNITY): Payer: Self-pay

## 2021-09-08 ENCOUNTER — Other Ambulatory Visit (HOSPITAL_COMMUNITY): Payer: Self-pay

## 2021-09-12 ENCOUNTER — Encounter (HOSPITAL_BASED_OUTPATIENT_CLINIC_OR_DEPARTMENT_OTHER): Payer: 59 | Attending: General Surgery | Admitting: General Surgery

## 2021-09-12 DIAGNOSIS — I739 Peripheral vascular disease, unspecified: Secondary | ICD-10-CM | POA: Insufficient documentation

## 2021-09-12 DIAGNOSIS — I87332 Chronic venous hypertension (idiopathic) with ulcer and inflammation of left lower extremity: Secondary | ICD-10-CM | POA: Diagnosis not present

## 2021-09-12 DIAGNOSIS — Z6841 Body Mass Index (BMI) 40.0 and over, adult: Secondary | ICD-10-CM | POA: Insufficient documentation

## 2021-09-12 DIAGNOSIS — M1990 Unspecified osteoarthritis, unspecified site: Secondary | ICD-10-CM | POA: Insufficient documentation

## 2021-09-12 DIAGNOSIS — L817 Pigmented purpuric dermatosis: Secondary | ICD-10-CM | POA: Insufficient documentation

## 2021-09-12 DIAGNOSIS — E669 Obesity, unspecified: Secondary | ICD-10-CM | POA: Diagnosis not present

## 2021-09-12 DIAGNOSIS — I10 Essential (primary) hypertension: Secondary | ICD-10-CM | POA: Insufficient documentation

## 2021-09-12 DIAGNOSIS — L97828 Non-pressure chronic ulcer of other part of left lower leg with other specified severity: Secondary | ICD-10-CM | POA: Diagnosis not present

## 2021-09-12 DIAGNOSIS — I872 Venous insufficiency (chronic) (peripheral): Secondary | ICD-10-CM | POA: Diagnosis not present

## 2021-09-12 DIAGNOSIS — L97822 Non-pressure chronic ulcer of other part of left lower leg with fat layer exposed: Secondary | ICD-10-CM | POA: Diagnosis not present

## 2021-09-12 NOTE — Progress Notes (Signed)
PURITY, IRMEN ([id number]) Visit Report for 09/12/2021 Arrival Information Details Patient Name: Date of Service: MIRAH, NEVINS 09/12/2021 11:00 A M Medical Record Number: [id number] Patient Account Number: [id number] Date of Birth/Sex: Treating RN: November 18, 1955 (66 y.o. [name] Primary Care Auda Finfrock: [name] Other Clinician: Referring Sheldon Amara: Treating Rolinda Impson/Extender: [name] in Treatment: 29 Visit Information History Since Last Visit All ordered tests and consults were completed: Yes Patient Arrived: Ambulatory Added or deleted any medications: No Arrival Time: 11:06 Any new allergies or adverse reactions: No Accompanied By: self Had a fall or experienced change in No Transfer Assistance: None activities of daily living that may affect Patient Requires Transmission-Based Precautions: No risk of falls: Patient Has Alerts: No Signs or symptoms of abuse/neglect since last visito No Hospitalized since last visit: No Implantable device outside of the clinic excluding No cellular tissue based products placed in the center since last visit: Has Dressing in Place as Prescribed: Yes Has Compression in Place as Prescribed: Yes Pain Present Now: No Electronic Signature(s) Signed: 09/12/2021 4:07:51 PM By: Blanche East RN Entered By: Blanche East on 09/12/2021 11:06:53 -------------------------------------------------------------------------------- Encounter Discharge Information Details Patient Name: Date of Service: [name] 09/12/2021 11:00 A M Medical Record Number: [id number] Patient Account Number: [id number] Date of Birth/Sex: Treating RN: 07-29-1955 (66 y.o. [name] Primary Care Edison Wollschlager: [name] Other Clinician: Referring Deanthony Maull: Treating Maesyn Frisinger/Extender: [name] in Treatment: 29 Encounter Discharge Information Items Post Procedure Vitals Discharge  Condition: Stable Temperature (F): 98.2 Ambulatory Status: Ambulatory Pulse (bpm): 82 Discharge Destination: Home Respiratory Rate (breaths/min): 18 Transportation: Private Auto Blood Pressure (mmHg): 117/73 Accompanied By: self Schedule Follow-up Appointment: Yes Clinical Summary of Care: Electronic Signature(s) Signed: 09/12/2021 4:07:51 PM By: Blanche East RN Entered By: Blanche East on 09/12/2021 11:32:33 -------------------------------------------------------------------------------- Lower Extremity Assessment Details Patient Name: Date of Service: DARIANE, NATZKE D. 09/12/2021 11:00 A M Medical Record Number: [id number] Patient Account Number: [id number] Date of Birth/Sex: Treating RN: 05-13-1955 (66 y.o. [name] Primary Care Arrielle Mcginn: [name] Other Clinician: Referring Kashmere Daywalt: Treating Shanecia Hoganson/Extender: [name] in Treatment: 29 Edema Assessment Assessed: [Left: No] [Right: No] Edema: [Left: Ye] [Right: s] Calf Left: Right: Point of Measurement: 29 cm From Medial Instep 38 cm Ankle Left: Right: Point of Measurement: 10 cm From Medial Instep 23 cm Vascular Assessment Pulses: Dorsalis Pedis Palpable: [Left:Yes] Electronic Signature(s) Signed: 09/12/2021 4:07:51 PM By: Blanche East RN Signed: 09/12/2021 5:58:43 PM By: [name] RN Entered By: Blanche East on 09/12/2021 11:14:37 -------------------------------------------------------------------------------- Multi Wound Chart Details Patient Name: Date of Service: [name] 09/12/2021 11:00 A M Medical Record Number: [id number] Patient Account Number: [id number] Date of Birth/Sex: Treating RN: 05/22/1955 (66 y.o. [name] Primary Care Jaclyne Haverstick: [name] Other Clinician: Referring Harriett Azar: Treating Arafat Cocuzza/Extender: [name] in Treatment: 29 Vital Signs Height(in): 62 Pulse(bpm): 63 Weight(lbs):  226 Blood Pressure(mmHg): 117/73 Body Mass Index(BMI): 41.3 Temperature(F): 98.0 Respiratory Rate(breaths/min): 18 Photos: [3:Left, Lateral Lower Leg] [N/A:N/A N/A] Wound Location: [3:Blister] [N/A:N/A] Wounding Event: [3:Venous Leg Ulcer] [N/A:N/A] Primary Etiology: [3:Cataracts, Lymphedema,] [N/A:N/A] Comorbid History: [3:Hypertension, Peripheral Venous Disease, Osteoarthritis 12/13/2020] [N/A:N/A] Date Acquired: [3:29] [N/A:N/A] Weeks of Treatment: [3:Open] [N/A:N/A] Wound Status: [3:No] [N/A:N/A] Wound Recurrence: [3:1.2x1.6x0.1] [N/A:N/A] Measurements L x W x D (cm) [3:1.508] [N/A:N/A] A (cm) : rea [3:0.151] [N/A:N/A] Volume (cm) : [3:94.80%] [N/A:N/A] % Reduction in A [3:rea: 97.40%] [N/A:N/A] % Reduction in Volume: [3:Full Thickness Without Exposed] [  N/A:N/A] Classification: [3:Support Structures Medium] [N/A:N/A] Exudate A mount: [3:Serosanguineous] [N/A:N/A] Exudate Type: [3:red, brown] [N/A:N/A] Exudate Color: [3:Distinct, outline attached] [N/A:N/A] Wound Margin: [3:Small (1-33%)] [N/A:N/A] Granulation A mount: [3:Pink] [N/A:N/A] Granulation Quality: [3:Large (67-100%)] [N/A:N/A] Necrotic A mount: [3:Fascia: No] [N/A:N/A] Exposed Structures: [3:Fat Layer (Subcutaneous Tissue): No Tendon: No Muscle: No Joint: No Bone: No Medium (34-66%)] [N/A:N/A] Epithelialization: [3:Debridement - Excisional] [N/A:N/A] Debridement: Pre-procedure Verification/Time Out 11:23 [N/A:N/A] Taken: [3:Lidocaine 4% Topical Solution] [N/A:N/A] Pain Control: [3:Subcutaneous] [N/A:N/A] Tissue Debrided: [3:Skin/Subcutaneous Tissue] [N/A:N/A] Level: [3:1.92] [N/A:N/A] Debridement A (sq cm): [3:rea Curette] [N/A:N/A] Instrument: [3:Minimum] [N/A:N/A] Bleeding: [3:Pressure] [N/A:N/A] Hemostasis A chieved: [3:0] [N/A:N/A] Procedural Pain: [3:0] [N/A:N/A] Post Procedural Pain: [3:Procedure was tolerated well] [N/A:N/A] Debridement Treatment Response: [3:1.2x1.6x0.1] [N/A:N/A] Post Debridement  Measurements L x W x D (cm) [3:0.151] [N/A:N/A] Post Debridement Volume: (cm) [3:Debridement] [N/A:N/A] Treatment Notes Electronic Signature(s) Signed: 09/12/2021 11:30:17 AM By: [name] MD FACS Signed: 09/12/2021 5:58:43 PM By: [name] RN Entered By: [name] on 09/12/2021 11:30:17 -------------------------------------------------------------------------------- Multi-Disciplinary Care Plan Details Patient Name: Date of Service: [name] 09/12/2021 11:00 A M Medical Record Number: [id number] Patient Account Number: [id number] Date of Birth/Sex: Treating RN: 1956/01/23 (66 y.o. [name] Primary Care Dell Hurtubise: [name] Other Clinician: Referring Mandeep Kiser: Treating Shakeda Pearse/Extender: [name] in Treatment: 29 Multidisciplinary Care Plan reviewed with physician Active Inactive Venous Leg Ulcer Nursing Diagnoses: Actual venous Insuffiency (use after diagnosis is confirmed) Goals: Patient will maintain optimal edema control Date Initiated: 02/21/2021 Target Resolution Date: 10/12/2021 Goal Status: Active Patient/caregiver will verbalize understanding of disease process and disease management Date Initiated: 02/21/2021 Date Inactivated: 03/29/2021 Target Resolution Date: 03/24/2021 Goal Status: Met Interventions: Assess peripheral edema status every visit. Compression as ordered Provide education on venous insufficiency Notes: Wound/Skin Impairment Nursing Diagnoses: Impaired tissue integrity Knowledge deficit related to ulceration/compromised skin integrity Goals: Patient/caregiver will verbalize understanding of skin care regimen Date Initiated: 02/21/2021 Target Resolution Date: 10/12/2021 Goal Status: Active Ulcer/skin breakdown will have a volume reduction of 30% by week 4 Date Initiated: 02/21/2021 Date Inactivated: 03/29/2021 Target Resolution Date: 03/24/2021 Goal Status: Unmet Unmet Reason:  infection Ulcer/skin breakdown will have a volume reduction of 50% by week 8 Date Initiated: 03/29/2021 Date Inactivated: 04/19/2021 Target Resolution Date: 04/21/2021 Goal Status: Met Interventions: Assess patient/caregiver ability to obtain necessary supplies Assess patient/caregiver ability to perform ulcer/skin care regimen upon admission and as needed Assess ulceration(s) every visit Provide education on ulcer and skin care Notes: Electronic Signature(s) Signed: 09/12/2021 4:07:51 PM By: Blanche East RN Signed: 09/12/2021 5:58:43 PM By: [name] RN Entered By: Blanche East on 09/12/2021 11:19:27 -------------------------------------------------------------------------------- Pain Assessment Details Patient Name: Date of Service: [name] 09/12/2021 11:00 A M Medical Record Number: [id number] Patient Account Number: [id number] Date of Birth/Sex: Treating RN: 06/13/1955 (66 y.o. [name] Primary Care Cartina Brousseau: [name] Other Clinician: Referring Pariss Hommes: Treating Jillisa Harris/Extender: [name] in Treatment: 29 Active Problems Location of Pain Severity and Description of Pain Patient Has Paino No Site Locations Rate the pain. Rate the pain. Current Pain Level: 0 Pain Management and Medication Current Pain Management: Electronic Signature(s) Signed: 09/12/2021 4:07:51 PM By: Blanche East RN Signed: 09/12/2021 5:58:43 PM By: [name] RN Entered By: Blanche East on 09/12/2021 11:08:32 -------------------------------------------------------------------------------- Patient/Caregiver Education Details Patient Name: Date of Service: Gersten, BO [name] 8/1/2023andnbsp11:00 A M Medical Record Number: [id number] Patient Account Number: [id number] Date of Birth/Gender: Treating RN: Oct 11, 1955 (66 y.o. [name] Primary Care Physician:  [name] Other Clinician: Referring Physician: Treating  Physician/Extender: [name] in Treatment: 29 Education Assessment Education Provided To: Patient Education Topics Provided Venous: Methods: Explain/Verbal Responses: Reinforcements needed, State content correctly Wound/Skin Impairment: Methods: Explain/Verbal Responses: Reinforcements needed, State content correctly Electronic Signature(s) Signed: 09/12/2021 4:07:51 PM By: Blanche East RN Entered By: Blanche East on 09/12/2021 11:19:46 -------------------------------------------------------------------------------- Wound Assessment Details Patient Name: Date of Service: [name] 09/12/2021 11:00 A M Medical Record Number: [id number] Patient Account Number: [id number] Date of Birth/Sex: Treating RN: 04/21/1955 (66 y.o. [name] Primary Care Jahlia Omura: [name] Other Clinician: Referring Mikeala Girdler: Treating Alfreida Steffenhagen/Extender: [name] in Treatment: 29 Wound Status Wound Number: 3 Primary Venous Leg Ulcer Etiology: Wound Location: Left, Lateral Lower Leg Wound Open Wounding Event: Blister Status: Date Acquired: 12/13/2020 Comorbid Cataracts, Lymphedema, Hypertension, Peripheral Venous Weeks Of Treatment: 29 History: Disease, Osteoarthritis Clustered Wound: No Photos Wound Measurements Length: (cm) 1.2 Width: (cm) 1.6 Depth: (cm) 0.1 Area: (cm) 1.508 Volume: (cm) 0.151 % Reduction in Area: 94.8% % Reduction in Volume: 97.4% Epithelialization: Medium (34-66%) Tunneling: No Undermining: No Wound Description Classification: Full Thickness Without Exposed Support Structures Wound Margin: Distinct, outline attached Exudate Amount: Medium Exudate Type: Serosanguineous Exudate Color: red, brown Foul Odor After Cleansing: No Slough/Fibrino Yes Wound Bed Granulation Amount: Small (1-33%) Exposed Structure Granulation Quality: Pink Fascia Exposed: No Necrotic Amount: Large  (67-100%) Fat Layer (Subcutaneous Tissue) Exposed: No Necrotic Quality: Adherent Slough Tendon Exposed: No Muscle Exposed: No Joint Exposed: No Bone Exposed: No Treatment Notes Wound #3 (Lower Leg) Wound Laterality: Left, Lateral Cleanser Soap and Water Discharge Instruction: May shower and wash wound with dial antibacterial soap and water prior to dressing change. Wound Cleanser Discharge Instruction: Cleanse the wound with wound cleanser prior to applying a clean dressing using gauze sponges, not tissue or cotton balls. Peri-Wound Care Sween Lotion (Moisturizing lotion) Discharge Instruction: Apply moisturizing lotion as directed Topical Primary Dressing Promogran Prisma Matrix, 4.34 (sq in) (silver collagen) Discharge Instruction: Moisten collagen with saline or hydrogel Secondary Dressing Zetuvit Plus 4x8 in Discharge Instruction: Apply over primary dressing as directed. Secured With Compression Wrap CoFlex TLC XL 2-layer Compression System 4x7 (in/yd) Discharge Instruction: Apply CoFlex 2-layer compression as directed. (alt for 4 layer) Compression Stockings Add-Ons Electronic Signature(s) Signed: 09/12/2021 4:07:51 PM By: Blanche East RN Signed: 09/12/2021 5:58:43 PM By: [name] RN Entered By: Blanche East on 09/12/2021 11:19:03 -------------------------------------------------------------------------------- Vitals Details Patient Name: Date of Service: [name] 09/12/2021 11:00 A M Medical Record Number: [id number] Patient Account Number: [id number] Date of Birth/Sex: Treating RN: Aug 23, 1955 (66 y.o. [name] Primary Care Mayling Aber: [name] Other Clinician: Referring Alycen Mack: Treating Marlies Ligman/Extender: [name] in Treatment: 29 Vital Signs Time Taken: 11:06 Temperature (F): 98.0 Height (in): 62 Pulse (bpm): 82 Weight (lbs): 226 Respiratory Rate (breaths/min): 18 Body Mass Index (BMI):  41.3 Blood Pressure (mmHg): 117/73 Reference Range: 80 - 120 mg / dl Electronic Signature(s) Signed: 09/12/2021 4:07:51 PM By: Blanche East RN Entered By: Blanche East on 09/12/2021 11:08:26

## 2021-09-12 NOTE — Progress Notes (Signed)
HOANG, REICH ([id number]) Visit Report for 09/12/2021 Chief Complaint Document Details Patient Name: Date of Service: [name], [name] 09/12/2021 11:00 A M Medical Record Number: [id number] Patient Account Number: [id number] Date of Birth/Sex: Treating RN: [birth year] (66 y.o. [name] Primary Care Provider: [name] Other Clinician: Referring Provider: Treating Provider/Extender: [name] in Treatment: 29 Information Obtained from: Patient Chief Complaint 04/19/2021: The patient is here for ongoing follow-up regarding 2 left lower extremity wounds. Electronic Signature(s) Signed: 09/12/2021 11:30:24 AM By: [name] MD FACS Entered By: [name] on 09/12/2021 11:30:24 -------------------------------------------------------------------------------- Debridement Details Patient Name: Date of Service: Erin Gerold [name] 09/12/2021 11:00 A M Medical Record Number: [id number] Patient Account Number: [id number] Date of Birth/Sex: Treating RN: 1956-01-07 (66 y.o. [name] Primary Care Provider: [name] Other Clinician: Referring Provider: Treating Provider/Extender: [name] in Treatment: 29 Debridement Performed for Assessment: Wound #3 Left,Lateral Lower Leg Performed By: Physician [name], MD Debridement Type: Debridement Severity of Tissue Pre Debridement: Fat layer exposed Level of Consciousness (Pre-procedure): Awake and Alert Pre-procedure Verification/Time Out Yes - 11:23 Taken: Start Time: 11:23 Pain Control: Lidocaine 4% T opical Solution T Area Debrided (L x W): otal 1.2 (cm) x 1.6 (cm) = 1.92 (cm) Tissue and other material debrided: Viable, Non-Viable, Subcutaneous Level: Skin/Subcutaneous Tissue Debridement Description: Excisional Instrument: Curette Bleeding: Minimum Hemostasis Achieved: Pressure Procedural Pain: 0 Post Procedural Pain: 0 Response to  Treatment: Procedure was tolerated well Level of Consciousness (Post- Awake and Alert procedure): Post Debridement Measurements of Total Wound Length: (cm) 1.2 Width: (cm) 1.6 Depth: (cm) 0.1 Volume: (cm) 0.151 Character of Wound/Ulcer Post Debridement: Stable Severity of Tissue Post Debridement: Fat layer exposed Post Procedure Diagnosis Same as Pre-procedure Electronic Signature(s) Signed: 09/12/2021 11:36:06 AM By: [name] MD FACS Signed: 09/12/2021 4:07:51 PM By: Blanche East RN Signed: 09/12/2021 5:58:43 PM By: [name] RN Entered By: Blanche East on 09/12/2021 11:25:24 -------------------------------------------------------------------------------- HPI Details Patient Name: Date of Service: Erin Face D. 09/12/2021 11:00 A M Medical Record Number: [id number] Patient Account Number: [id number] Date of Birth/Sex: Treating RN: Jan 26, 1956 (66 y.o. 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 She tells [name] that she fell on the gravel driveway in July. She developed injuries on her distal lower leg which have not healed. She saw her primary physician on 11/15/2017 who noted her left shin injuries. Gave her antibiotics. At that point the wounds were almost circumferential however most were less than 1.5 cm. Weeping edema fluid was noted. She was referred here for evaluation. The patient has a history of chronic lower extremity edema. 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 She has had prior history with leg weeping fluid. She does not wear compression stockings. She is not doing anything  specific to these wound areas. 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 She also has lymphedema. Put her in 3 layer compression and silver alginate  last week. She has 3 small wounds with depth just lateral to the tibia. More problematically than this she has numerous shallow areas some of which are almost canal like in shape with tightly adherent painful debris. It would be very difficult and time- consuming to go through this and attempt to individually debride all these areas.. I changed her to collagen today to see if that would help with any of the surface debris on some of these wounds. Otherwise we will not be able to put this in compression we have to have someone change the dressing. The patient is not eligible for home health 12/25/17 on evaluation today patient actually appears to be doing rather well in regard to the ulcer on her lower extremity. Fortunately there does not appear to be evidence of infection at this time. She has been tolerating the dressing changes without complication. This includes the compression wrap. The only issue she had was that the wrap was initially placed over her bunion region which actually calls her some discomfort and pain. Other than that things seem to be going rather well. 01/01/2018 Seen today for follow-up and management of left lower extremity wound and lymphedema. T oday she presents with a new wound towards to the left lateral LE. Recently treated with a 7 day course of amoxicillin; reason for antibiotic dose is unknown at this time. Tolerating current treatment of collagen with 4- layer wraps. She obtained a venous reflux study on 12/25/17. Studies show on the right  abnormal reflux times of the popliteal vein, great saphenous vein at the saphenofemoral junction at the proximal thigh, great saphenous vein at the mid calf, and origin of the small saphenous vein.No superficial thrombosis. No deep vein thrombosis in the common femoral, femoral,and popliteal veins. Left abnormal reflex times as well of the common femoral vein, popliteal vein, and a great saphenous vein at the saphenofemoral junction, In great saphenous vein at the mid thigh w/o thrombosis. Has any issues or concerns during visit today. Recommended follow-up to vascular specialist due to abnormalities from the venous reflux study. Denies fever, pain, chills, dizziness, nausea, or vomiting. 01/08/18 upon evaluation today the patient actually seems to be showing some signs of improvement in my opinion at this point in regard to the lower extremity ulcerated areas. She still has a lot of drainage but fortunately nothing that appears to be too significant currently. I have been very happy with the overall progress I see today compared to where things were during the last evaluation that I had with her. Nonetheless she has not had her appointment with the vein specialist as of yet in fact we were able to get this approved for her today and confirmed with them she will be seeing them on December 26. Nonetheless in general I do feel like the compression wraps is doing well for her. 01/17/18; quite a bit of improvement since last time I saw this patient she has a small open area remaining on the left lateral calf and even smaller area medially. She has lymphedema chronic stasis changes with distal skin fibrosis. She has an appointment with vascular surgery later this month 01/24/2018; the patient's medial leg has closed. Still a small open area on the left lateral leg. She states that the 4 layer compression we put on last week was too tight and she had to take it off over a few days ago. We have had resultant  increase in her lymphedema in the dorsal foot and a proximal calf.  Fortunately that does not seem to have resulted in any deterioration in her wounds The patient is going to need compression stockings. We have given her measurements to phone elastic therapy in [place]. She has vascular surgery consult on December 26 02/07/18; she's had continuous contraction on the left lateral leg wound which is now very small. Currently using silver alginate under 3 layer compression She saw Dr. Trula Slade of vascular surgery on 02/06/18. It was noted that she had a normal reflux times in the popliteal vein, great saphenous vein at the saphenofemoral junction, great saphenous vein at the proximal thigh great saphenous vein at the mid calf and origin of the small saphenous vein. It was noted that she had significant reflux in the left saphenous veins with diameter measurements in the 0.7-0.8 cm range. It was felt she would benefit from laser ablation to help minimize the risk of ulcer recurrence. It was recommended that she wear 20-30 thigh-high compression stockings and have follow-up in 4-6 weeks 02/17/2018; I thought this lady would be healed however her compression slipped down and she developed increasing swelling and the wound is actually larger. 1/13; we had deterioration last week after the patient's compression slipped down and she developed periwound swelling. I increased her compression before layers we have been using silver alginate we are a lot better again today. She has her compression stockings in waiting 1/23; the patient's wounds are totally healed today. She has her stockings. This was almost circumferential skin damage. She follows up with Dr. Trula Slade of vascular surgery next Monday. READMISSION 02/21/2021 This is a now 66 year old woman that we had in clinic here discharging in January 2020 with wounds on her left calf chronic venous insufficiency. She was discharged with 30/40 stockings. It does  not sound like she has worn stockings in about a year largely from not being able to get them on herself. In November she developed new blisters on her legs an area laterally is opened into a fairly sizable wound. She has weeping posteriorly as well. She has been using Neosporin and Band-Aids. The patient did see Dr. Trula Slade in 2019 and 2020. He felt she might benefit from laser ablation in her left saphenous veins although because of the wound that healed I do not think he went through with it. She might benefit from seeing him again. Her ABI on the left is 1. 1/17; patient's wound on the posterior left calf is closed she has the 2 large areas last week. We put her in 4-layer compression for edema control is a lot better. Our intake nurse noted greenish drainage and odor. We have been using silver alginate 1/25; PCR culture I did have the substantial wound area on the left lateral lower leg showed staff aureus and group A strep. Low titers of coag negative staph which are probably skin contaminants. Resistance detected to tetracycline methicillin and macrolides. I gave her a starter kit of Nuzyra 150 mg x 3 for 2 days then 300 mg for a further 7 days. Marked odor considerable increase in surrounding erythema. We are using Iodoflex last week however I have changed to silver alginate with underlying Bactroban 2/1; she is completing her [place] tomorrow. The degree of erythema around the wounds looks a lot better. Odor has improved. I gave her silver alginate and Bactroban last week and changing her back to Iodoflex to continue with ongoing debridement 2/8; the periwound looks a lot better. Surface of the wound also looks somewhat better although there is still  ongoing debridement to be done we have been using Iodoflex under compression. Primary dressing and silver alginate 2/15; left posterior calf. Some improvement in the surface of the wound but still very gritty we have been using Iodoflex under  compression. 04/05/2021: Left lateral posterior calf. She continues to have significant amounts of drainage, some of which is probably comprised of the Iodoflex microbleeds. There is no odor to the drainage. The satellite lesion on the more posterior aspect of the calf is epithelializing nicely and has contracted quite a bit. There is robust granulation tissue in the dominant wound with minimal adherent slough. 04/12/2021: The satellite lesion has nearly closed. She continues to have good granulation tissue with minimal slough of the larger primary wound. She continues to have a fair amount of drainage, but I think this is less secondary to switching her dressing from Iodoflex to Prisma. 04/19/2021: The satellite lesion is almost completely epithelialized. The larger primary wound continues to contract with good granulation tissue and minimal slough. She is currently in [place] with compression. 04/26/2021: The satellite lesion has closed. The larger primary wound has contracted further and the granulation tissue is robust without being hypertrophic. Minimal slough is present. 05/03/2021: The satellite lesion remains closed. The larger primary wound has a bit of slough, but has also contracted further with good perimeter epithelialization. Good granulation tissue at the wound surface. There was some greenish drainage on the dressing when it was removed; it is not the blue-green typically associated with Pseudomonas aeruginosa, however. 05/10/2021: The primary wound continues to contract. There is minimal slough. The granulation tissue at 9:00 is a bit hypertrophic. No significant drainage. No odor. 05/17/2021: The wound is a little bit smaller today with good granulation tissue. Minimal slough. No significant drainage or odor. 05/24/2021: The wound continues to contract and has a nice base of granulation tissue. Small amount of slough. No concern for infection. 05/31/2021: For some reason, the wound measured  slightly larger today but overall it still appears to be in good condition with a nice base of granulation tissue and minimal slough. 06/07/2021: The wound is smaller today. Good granulation tissue and minimal slough. 06/14/2021: The wound is unchanged in size. She continues to accumulate some slough. Good granulation tissue on the surface. 06/20/2021: The wound is smaller today. Minimal slough with good granulation tissue. 06/27/2021: The wound on her left lateral leg is smaller today with just a bit of slough and eschar accumulation. Unfortunately, she has opened a new superficial wound on her right lower extremity. She has 3+ pitting edema to the knees on that leg and she does not wear compression stockings. 07/04/2021: In addition to the superficial wound on her right lower extremity that she opened up last week, she has opened 3 additional sites on the same leg. Her compression wraps were clearly not in correct position when she came to clinic today as they were at about the mid calf level rather than to the tibial tuberosity. She says that they slipped earlier and she had to come back on Friday to have them redone. The wound on her left lateral leg is perhaps slightly larger with some slough accumulation. 07/11/2021: The superficial wounds on her right lower extremity are nearly closed. They just have a thin layer of eschar overlying them. The wound on her left lateral leg is a little bit shallower and has a bit of slough accumulation. 07/17/2021: The right medial lower extremity leg wounds have almost completely closed; one of them just has  a tiny opening with a little bit of serous drainage. The left lateral leg wound is really unchanged. It seems to be stalled. 07/24/2021: The right leg wounds are completely closed. The left lateral leg wound is actually bigger today. It is a bit more tender. There is a minor accumulation of slough on the surface. 07/31/2021: The culture that I took last week was  positive for MRSA. We added mupirocin under the silver alginate and I prescribed doxycycline. She has been tolerating this well. The wound is smaller today but does have slough accumulation. No significant pain or drainage. 08/07/2021: She completed her course of doxycycline. The wound looks much better today. It is smaller and the periwound is less inflamed. She does have some slough accumulation on the surface. 08/14/2021: The wound continues to contract. There is slough on the wound surface, but the periwound is intact without inflammation or induration. 08/21/2021: The wound is down to just 2 small open sites. They both have a bit of slough accumulation. 08/28/2021: The wound is down to just 1 small open site with a little bit of slough and eschar accumulation. 09/04/2021: The wound continues to contract but remains open. It is clean without any slough. 09/12/2021: No real change in the overall wound dimensions, but it is flush with the surrounding skin. There is a little bit of slough accumulation. Edema control is good. Electronic Signature(s) Signed: 09/12/2021 11:31:14 AM By: [name] MD FACS Entered By: [name] on 09/12/2021 11:31:14 -------------------------------------------------------------------------------- Physical Exam Details Patient Name: Date of Service: Erin Gerold [name] 09/12/2021 11:00 A M Medical Record Number: [id number] Patient Account Number: [id number] Date of Birth/Sex: Treating RN: 09-16-1955 (66 y.o. [name] Primary Care Provider: [name] Other Clinician: Referring Provider: Treating Provider/Extender: [name] in Treatment: 29 Constitutional . . . . No acute distress.Marland Kitchen Respiratory Normal work of breathing on room air.. Notes 09/12/2021: No real change in the overall wound dimensions, but it is flush with the surrounding skin. There is a little bit of slough accumulation. Edema control is good. Electronic  Signature(s) Signed: 09/12/2021 11:31:46 AM By: [name] MD FACS Entered By: [name] on 09/12/2021 11:31:46 -------------------------------------------------------------------------------- Physician Orders Details Patient Name: Date of Service: Erin Gerold [name] 09/12/2021 11:00 A M Medical Record Number: [id number] Patient Account Number: [id number] Date of Birth/Sex: Treating RN: [birth year] (66 y.o. [name] Primary Care Provider: [name] Other Clinician: Referring Provider: Treating Provider/Extender: [name] in Treatment: 29 Verbal / Phone Orders: No Diagnosis Coding ICD-10 Coding Code Description 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 (idiopathic) with ulcer and inflammation of left lower extremity L97.828 Non-pressure chronic ulcer of other part of left lower leg with other specified severity Follow-up Appointments ppointment in 1 week. - Dr. Celine Ahr Room 3 Return A Friday September 22, 2021 @ 2:30 Bathing/ Shower/ Hygiene May shower with protection but do not get wound dressing(s) wet. - Ok to use [employer], can purchase at [facility], [facility], or [employer] Edema Control - Lymphedema / SCD / Other Elevate legs to the level of the heart or above for 30 minutes daily and/or when sitting, a frequency of: - throughout the day Avoid standing for long periods of time. Exercise regularly Compression stocking or Garment 20-30 mm/Hg pressure to: - Brochure for Elastic Therapy. Leg measurements included Wound Treatment Wound #3 - Lower Leg Wound Laterality: Left, Lateral Cleanser: Soap and Water 1 x Per Week/30 Days Discharge Instructions: May shower and wash wound with  dial antibacterial soap and water prior to dressing change. Cleanser: Wound Cleanser 1 x Per Week/30 Days Discharge Instructions: Cleanse the wound with wound cleanser prior to applying a clean dressing using gauze sponges, not tissue or cotton balls. Peri-Wound  Care: Sween Lotion (Moisturizing lotion) 1 x Per Week/30 Days Discharge Instructions: Apply moisturizing lotion as directed Prim Dressing: Promogran Prisma Matrix, 4.34 (sq in) (silver collagen) 1 x Per Week/30 Days ary Discharge Instructions: Moisten collagen with saline or hydrogel Secondary Dressing: Zetuvit Plus 4x8 in 1 x Per Week/30 Days Discharge Instructions: Apply over primary dressing as directed. Compression Wrap: CoFlex TLC XL 2-layer Compression System 4x7 (in/yd) 1 x Per Week/30 Days Discharge Instructions: Apply CoFlex 2-layer compression as directed. (alt for 4 layer) Electronic Signature(s) Signed: 09/12/2021 11:33:51 AM By: [name] MD FACS Entered By: [name] on 09/12/2021 11:33:51 -------------------------------------------------------------------------------- Problem List Details Patient Name: Date of Service: Erin Gerold [name] 09/12/2021 11:00 A M Medical Record Number: [id number] Patient Account Number: [id number] Date of Birth/Sex: Treating RN: [birth year] (66 y.o. [name] Primary Care Provider: [name] Other Clinician: Referring Provider: Treating Provider/Extender: [name] in Treatment: 29 Active Problems ICD-10 Encounter Code Description Active Date MDM Diagnosis 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 (idiopathic) with ulcer and inflammation of left 02/21/2021 No Yes lower extremity L97.828 Non-pressure chronic ulcer of other part of left lower leg with other specified 02/21/2021 No Yes severity Inactive Problems ICD-10 Code Description Active Date Inactive Date L03.116 Cellulitis of left lower limb 03/08/2021 03/08/2021 L97.811 Non-pressure chronic ulcer of other part of right lower leg limited to breakdown of skin 06/27/2021 06/27/2021 Resolved Problems Electronic Signature(s) Signed: 09/12/2021 11:30:11 AM By: [name] MD FACS Entered By: [name] on 09/12/2021  11:30:11 -------------------------------------------------------------------------------- Progress Note Details Patient Name: Date of Service: Erin Face D. 09/12/2021 11:00 A M Medical Record Number: [id number] Patient Account Number: [id number] Date of Birth/Sex: Treating RN: 1955-07-25 (66 y.o. [name] Primary Care Provider: [name] Other Clinician: Referring Provider: Treating Provider/Extender: [name] in Treatment: 29 Subjective Chief Complaint Information obtained from Patient 04/19/2021: The patient is here for ongoing follow-up regarding 2 left lower extremity wounds. 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 She tells [name] that she fell on the gravel driveway in July. She developed injuries on her distal lower leg which have not healed. She saw her primary physician on 11/15/2017 who noted her left shin injuries. Gave her antibiotics. At that point the wounds were almost circumferential however most were less than 1.5 cm. Weeping edema fluid was noted. She was referred here for evaluation. The patient has a history of chronic lower extremity edema. 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 She has had prior history with leg weeping fluid. She does not wear compression stockings. She is not doing anything specific to these wound areas. 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 She also has lymphedema. Put her in 3 layer compression and silver alginate last week.  She has 3 small wounds with depth just lateral to the tibia.  More problematically than this she has numerous shallow areas some of which are almost canal like in shape with tightly adherent painful debris. It would be very difficult and time- consuming to go through this and attempt to individually debride all these areas.. I changed her to collagen today to see if that would help with any of the surface debris on some of these wounds. Otherwise we will not be able to put this in compression we have to have someone change the dressing. The patient is not eligible for home health 12/25/17 on evaluation today patient actually appears to be doing rather well in regard to the ulcer on her lower extremity. Fortunately there does not appear to be evidence of infection at this time. She has been tolerating the dressing changes without complication. This includes the compression wrap. The only issue she had was that the wrap was initially placed over her bunion region which actually calls her some discomfort and pain. Other than that things seem to be going rather well. 01/01/2018 Seen today for follow-up and management of left lower extremity wound and lymphedema. T oday she presents with a new wound towards to the left lateral LE. Recently treated with a 7 day course of amoxicillin; reason for antibiotic dose is unknown at this time. Tolerating current treatment of collagen with 4- layer wraps. She obtained a venous reflux study on 12/25/17. Studies show on the right abnormal reflux times of the popliteal vein, great saphenous vein at the saphenofemoral junction at the proximal thigh, great saphenous vein at the mid calf, and origin of the small saphenous vein.No superficial thrombosis. No deep vein thrombosis in the common femoral, femoral,and popliteal veins. Left abnormal reflex times as well of the common femoral vein, popliteal vein, and a great saphenous vein at the saphenofemoral junction, In great  saphenous vein at the mid thigh w/o thrombosis. Has any issues or concerns during visit today. Recommended follow-up to vascular specialist due to abnormalities from the venous reflux study. Denies fever, pain, chills, dizziness, nausea, or vomiting. 01/08/18 upon evaluation today the patient actually seems to be showing some signs of improvement in my opinion at this point in regard to the lower extremity ulcerated areas. She still has a lot of drainage but fortunately nothing that appears to be too significant currently. I have been very happy with the overall progress I see today compared to where things were during the last evaluation that I had with her. Nonetheless she has not had her appointment with the vein specialist as of yet in fact we were able to get this approved for her today and confirmed with them she will be seeing them on December 26. Nonetheless in general I do feel like the compression wraps is doing well for her. 01/17/18; quite a bit of improvement since last time I saw this patient she has a small open area remaining on the left lateral calf and even smaller area medially. She has lymphedema chronic stasis changes with distal skin fibrosis. She has an appointment with vascular surgery later this month 01/24/2018; the patient's medial leg has closed. Still a small open area on the left lateral leg. She states that the 4 layer compression we put on last week was too tight and she had to take it off over a few days ago. We have had resultant increase in her lymphedema in the dorsal foot and a proximal calf. Fortunately that does not seem to have resulted in any deterioration in her wounds The  patient is going to need compression stockings. We have given her measurements to phone elastic therapy in [place]. She has vascular surgery consult on December 26 02/07/18; she's had continuous contraction on the left lateral leg wound which is now very small. Currently using silver  alginate under 3 layer compression She saw Dr. Trula Slade of vascular surgery on 02/06/18. It was noted that she had a normal reflux times in the popliteal vein, great saphenous vein at the saphenofemoral junction, great saphenous vein at the proximal thigh great saphenous vein at the mid calf and origin of the small saphenous vein. It was noted that she had significant reflux in the left saphenous veins with diameter measurements in the 0.7-0.8 cm range. It was felt she would benefit from laser ablation to help minimize the risk of ulcer recurrence. It was recommended that she wear 20-30 thigh-high compression stockings and have follow-up in 4-6 weeks 02/17/2018; I thought this lady would be healed however her compression slipped down and she developed increasing swelling and the wound is actually larger. 1/13; we had deterioration last week after the patient's compression slipped down and she developed periwound swelling. I increased her compression before layers we have been using silver alginate we are a lot better again today. She has her compression stockings in waiting 1/23; the patient's wounds are totally healed today. She has her stockings. This was almost circumferential skin damage. She follows up with Dr. Trula Slade of vascular surgery next Monday. READMISSION 02/21/2021 This is a now 66 year old woman that we had in clinic here discharging in January 2020 with wounds on her left calf chronic venous insufficiency. She was discharged with 30/40 stockings. It does not sound like she has worn stockings in about a year largely from not being able to get them on herself. In November she developed new blisters on her legs an area laterally is opened into a fairly sizable wound. She has weeping posteriorly as well. She has been using Neosporin and Band-Aids. The patient did see Dr. Trula Slade in 2019 and 2020. He felt she might benefit from laser ablation in her left saphenous veins although because of  the wound that healed I do not think he went through with it. She might benefit from seeing him again. Her ABI on the left is 1. 1/17; patient's wound on the posterior left calf is closed she has the 2 large areas last week. We put her in 4-layer compression for edema control is a lot better. Our intake nurse noted greenish drainage and odor. We have been using silver alginate 1/25; PCR culture I did have the substantial wound area on the left lateral lower leg showed staff aureus and group A strep. Low titers of coag negative staph which are probably skin contaminants. Resistance detected to tetracycline methicillin and macrolides. I gave her a starter kit of Nuzyra 150 mg x 3 for 2 days then 300 mg for a further 7 days. Marked odor considerable increase in surrounding erythema. We are using Iodoflex last week however I have changed to silver alginate with underlying Bactroban 2/1; she is completing her [place] tomorrow. The degree of erythema around the wounds looks a lot better. Odor has improved. I gave her silver alginate and Bactroban last week and changing her back to Iodoflex to continue with ongoing debridement 2/8; the periwound looks a lot better. Surface of the wound also looks somewhat better although there is still ongoing debridement to be done we have been using Iodoflex under compression. Primary dressing  and silver alginate 2/15; left posterior calf. Some improvement in the surface of the wound but still very gritty we have been using Iodoflex under compression. 04/05/2021: Left lateral posterior calf. She continues to have significant amounts of drainage, some of which is probably comprised of the Iodoflex microbleeds. There is no odor to the drainage. The satellite lesion on the more posterior aspect of the calf is epithelializing nicely and has contracted quite a bit. There is robust granulation tissue in the dominant wound with minimal adherent slough. 04/12/2021: The satellite  lesion has nearly closed. She continues to have good granulation tissue with minimal slough of the larger primary wound. She continues to have a fair amount of drainage, but I think this is less secondary to switching her dressing from Iodoflex to Prisma. 04/19/2021: The satellite lesion is almost completely epithelialized. The larger primary wound continues to contract with good granulation tissue and minimal slough. She is currently in [place] with compression. 04/26/2021: The satellite lesion has closed. The larger primary wound has contracted further and the granulation tissue is robust without being hypertrophic. Minimal slough is present. 05/03/2021: The satellite lesion remains closed. The larger primary wound has a bit of slough, but has also contracted further with good perimeter epithelialization. Good granulation tissue at the wound surface. There was some greenish drainage on the dressing when it was removed; it is not the blue-green typically associated with Pseudomonas aeruginosa, however. 05/10/2021: The primary wound continues to contract. There is minimal slough. The granulation tissue at 9:00 is a bit hypertrophic. No significant drainage. No odor. 05/17/2021: The wound is a little bit smaller today with good granulation tissue. Minimal slough. No significant drainage or odor. 05/24/2021: The wound continues to contract and has a nice base of granulation tissue. Small amount of slough. No concern for infection. 05/31/2021: For some reason, the wound measured slightly larger today but overall it still appears to be in good condition with a nice base of granulation tissue and minimal slough. 06/07/2021: The wound is smaller today. Good granulation tissue and minimal slough. 06/14/2021: The wound is unchanged in size. She continues to accumulate some slough. Good granulation tissue on the surface. 06/20/2021: The wound is smaller today. Minimal slough with good granulation tissue. 06/27/2021: The  wound on her left lateral leg is smaller today with just a bit of slough and eschar accumulation. Unfortunately, she has opened a new superficial wound on her right lower extremity. She has 3+ pitting edema to the knees on that leg and she does not wear compression stockings. 07/04/2021: In addition to the superficial wound on her right lower extremity that she opened up last week, she has opened 3 additional sites on the same leg. Her compression wraps were clearly not in correct position when she came to clinic today as they were at about the mid calf level rather than to the tibial tuberosity. She says that they slipped earlier and she had to come back on Friday to have them redone. The wound on her left lateral leg is perhaps slightly larger with some slough accumulation. 07/11/2021: The superficial wounds on her right lower extremity are nearly closed. They just have a thin layer of eschar overlying them. The wound on her left lateral leg is a little bit shallower and has a bit of slough accumulation. 07/17/2021: The right medial lower extremity leg wounds have almost completely closed; one of them just has a tiny opening with a little bit of serous drainage. The left lateral leg  wound is really unchanged. It seems to be stalled. 07/24/2021: The right leg wounds are completely closed. The left lateral leg wound is actually bigger today. It is a bit more tender. There is a minor accumulation of slough on the surface. 07/31/2021: The culture that I took last week was positive for MRSA. We added mupirocin under the silver alginate and I prescribed doxycycline. She has been tolerating this well. The wound is smaller today but does have slough accumulation. No significant pain or drainage. 08/07/2021: She completed her course of doxycycline. The wound looks much better today. It is smaller and the periwound is less inflamed. She does have some slough accumulation on the surface. 08/14/2021: The wound  continues to contract. There is slough on the wound surface, but the periwound is intact without inflammation or induration. 08/21/2021: The wound is down to just 2 small open sites. They both have a bit of slough accumulation. 08/28/2021: The wound is down to just 1 small open site with a little bit of slough and eschar accumulation. 09/04/2021: The wound continues to contract but remains open. It is clean without any slough. 09/12/2021: No real change in the overall wound dimensions, but it is flush with the surrounding skin. There is a little bit of slough accumulation. Edema control is good. Patient History Information obtained from Patient. Family History Heart Disease - Mother,Father, Hypertension - Mother,Father, Kidney Disease - Mother, Thyroid Problems - Mother, No family history of Cancer, Diabetes, Hereditary Spherocytosis, Lung Disease, Seizures, Stroke, Tuberculosis. Social History Never smoker, Marital Status - Single, Alcohol Use - Never, Drug Use - No History, Caffeine Use - Daily - coffee. Medical History Eyes Patient has history of Cataracts Hematologic/Lymphatic Patient has history of Lymphedema Cardiovascular Patient has history of Hypertension, Peripheral Venous Disease Integumentary (Skin) Denies history of History of Burn Musculoskeletal Patient has history of Osteoarthritis Hospitalization/Surgery History - colostomy reversal. 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 Medical A Surgical History Notes nd Constitutional Symptoms (General Health) morbid obesity Cardiovascular schamberg disease, hyperlipidemia Gastrointestinal diverticulitis , h/o obstruction due to diverticulitis , colostomy and colostomy reversal Endocrine hypothyroidism Objective Constitutional No acute distress.. Vitals Time Taken: 11:06 AM, Height: 62 in, Weight: 226 lbs, BMI: 41.3, Temperature: 98.0 F, Pulse: 82 bpm, Respiratory Rate: 18 breaths/min, Blood Pressure: 117/73  mmHg. Respiratory Normal work of breathing on room air.. General Notes: 09/12/2021: No real change in the overall wound dimensions, but it is flush with the surrounding skin. There is a little bit of slough accumulation. Edema control is good. Integumentary (Hair, Skin) Wound #3 status is Open. Original cause of wound was Blister. The date acquired was: 12/13/2020. The wound has been in treatment 29 weeks. The wound is located on the Left,Lateral Lower Leg. The wound measures 1.2cm length x 1.6cm width x 0.1cm depth; 1.508cm^2 area and 0.151cm^3 volume. There is no tunneling or undermining noted. There is a medium amount of serosanguineous drainage noted. The wound margin is distinct with the outline attached to the wound base. There is small (1-33%) pink granulation within the wound bed. There is a large (67-100%) amount of necrotic tissue within the wound bed including Adherent Slough. 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 There was a  Excisional Skin/Subcutaneous Tissue Debridement with a total area of 1.92 sq cm performed by [name], MD. With the following instrument(s): Curette to remove Viable and Non-Viable tissue/material. Material removed includes Subcutaneous Tissue after achieving pain control using Lidocaine 4% T opical Solution. No specimens were taken. A time out was conducted at 11:23, prior to the start of the procedure. A Minimum amount of bleeding was controlled with Pressure. The procedure was tolerated well with a pain level of 0 throughout and a pain level of 0 following the procedure. Post Debridement Measurements: 1.2cm  length x 1.6cm width x 0.1cm depth; 0.151cm^3 volume. Character of Wound/Ulcer Post Debridement is stable. Severity of Tissue Post Debridement is: Fat layer exposed. Post procedure Diagnosis Wound #3: Same as Pre-Procedure Plan Follow-up Appointments: Return Appointment in 1 week. - Dr. Celine Ahr Room 3 Friday September 22, 2021 @ 2:30 Bathing/ Shower/ Hygiene: May shower with protection but do not get wound dressing(s) wet. - Ok to use Market [employer], can purchase at [facility], [facility], or [employer] Edema Control - Lymphedema / SCD / Other: Elevate legs to the level of the heart or above for 30 minutes daily and/or when sitting, a frequency of: - throughout the day Avoid standing for long periods of time. Exercise regularly Compression stocking or Garment 20-30 mm/Hg pressure to: - Brochure for Elastic Therapy. Leg measurements included WOUND #3: - Lower Leg Wound Laterality: Left, Lateral Cleanser: Soap and Water 1 x Per Week/30 Days Discharge Instructions: May shower and wash wound with dial antibacterial soap and water prior to dressing change. Cleanser: Wound Cleanser 1 x Per Week/30 Days Discharge Instructions: Cleanse the wound with wound cleanser prior to applying a clean dressing using gauze sponges, not tissue or cotton balls. Peri-Wound Care: Sween Lotion (Moisturizing lotion) 1 x Per Week/30 Days Discharge Instructions: Apply moisturizing lotion as directed Prim Dressing: Promogran Prisma Matrix, 4.34 (sq in) (silver collagen) 1 x Per Week/30 Days ary Discharge Instructions: Moisten collagen with saline or hydrogel Secondary Dressing: Zetuvit Plus 4x8 in 1 x Per Week/30 Days Discharge Instructions: Apply over primary dressing as directed. Com pression Wrap: CoFlex TLC XL 2-layer Compression System 4x7 (in/yd) 1 x Per Week/30 Days Discharge Instructions: Apply CoFlex 2-layer compression as directed. (alt for 4 layer) 09/12/2021: No real change in the overall wound dimensions, but it is flush  with the surrounding skin. There is a little bit of slough accumulation. Edema control is good. I used a curette to debride slough and nonviable subcutaneous tissue from the wound. I do not think she needs mupirocin any longer. We have been using silver alginate for many weeks without substantial change in the wound; I am going to change to Prisma silver collagen, as we have not used this in quite some time. Continue Coflex compression. Follow-up in 1 week. Electronic Signature(s) Signed: 09/12/2021 11:34:35 AM By: [name] MD FACS Entered By: [name] on 09/12/2021 11:34:34 -------------------------------------------------------------------------------- HxROS Details Patient Name: Date of Service: Erin Gerold [name] 09/12/2021 11:00 A M Medical Record Number: [id number] Patient Account Number: [id number] Date of Birth/Sex: Treating RN: [birth year] (66 y.o. [name] Primary Care Provider: [name] Other Clinician: Referring Provider: Treating Provider/Extender: [name] in Treatment: 29 Information Obtained From Patient Constitutional Symptoms (General Health) Medical History: Past Medical History Notes: morbid obesity Eyes Medical History: Positive for: Cataracts Hematologic/Lymphatic Medical History: Positive for: Lymphedema Cardiovascular Medical History: Positive for: Hypertension; Peripheral Venous Disease Past Medical History Notes: schamberg disease, hyperlipidemia Gastrointestinal Medical History: Past  Medical History Notes: diverticulitis , h/o obstruction due to diverticulitis , colostomy and colostomy reversal Endocrine Medical History: Past Medical History Notes: hypothyroidism Integumentary (Skin) Medical History: Negative for: History of Burn Musculoskeletal Medical History: Positive for: Osteoarthritis HBO Extended History Items Eyes: Cataracts Immunizations Pneumococcal Vaccine: Received  Pneumococcal Vaccination: Yes Received Pneumococcal Vaccination On or After 60th Birthday: Yes Implantable Devices None Hospitalization / Surgery History Type of Hospitalization/Surgery colostomy reversal 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 Family and Social History Cancer: No; Diabetes: No; Heart Disease: Yes - Mother,Father; Hereditary Spherocytosis: No; Hypertension: Yes - Mother,Father; Kidney Disease: Yes - Mother; Lung Disease: No; Seizures: No; Stroke: No; Thyroid Problems: Yes - Mother; Tuberculosis: No; Never smoker; Marital Status - Single; Alcohol Use: Never; Drug Use: No History; Caffeine Use: Daily - coffee; Financial Concerns: No; Food, Clothing or Shelter Needs: No; Support System Lacking: No; Transportation Concerns: No Electronic Signature(s) Signed: 09/12/2021 11:36:06 AM By: [name] MD FACS Signed: 09/12/2021 5:58:43 PM By: [name] RN Entered By: [name] on 09/12/2021 11:31:25 -------------------------------------------------------------------------------- SuperBill Details Patient Name: Date of Service: Erin Face D. 09/12/2021 Medical Record Number: [id number] Patient Account Number: [id number] Date of Birth/Sex: Treating RN: [birth year] (66 y.o. [name] Primary Care Provider: [name] Other Clinician: Referring Provider: Treating Provider/Extender: [name] in Treatment: 29 Diagnosis Coding ICD-10 Codes Code Description [phone] Chronic venous hypertension (idiopathic) with ulcer and inflammation of left lower extremity L97.828 Non-pressure chronic ulcer of other part of left lower leg with other specified severity Facility Procedures CPT4 Code: [id number] Description: 06015 - DEB SUBQ TISSUE 20 SQ CM/< ICD-10 Diagnosis Description L97.828 Non-pressure chronic ulcer of other part of left lower leg with other specified Modifier: severity Quantity: 1 Physician Procedures : CPT4 Code  Description Modifier [id number] - WC PHYS LEVEL 3 - EST PT 25 ICD-10 Diagnosis Description L97.828 Non-pressure chronic ulcer of other part of left lower leg with other specified severity 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 (idiopathic)  with ulcer and inflammation of left lower extremity Quantity: 1 : [id number] - WC PHYS SUBQ TISS 20 SQ CM ICD-10 Diagnosis Description L97.828 Non-pressure chronic ulcer of other part of left lower leg with other specified severity Quantity: 1 Electronic Signature(s) Signed: 09/12/2021 11:34:51 AM By: [name] MD FACS Entered By: [name] on 09/12/2021 11:34:51

## 2021-09-22 ENCOUNTER — Encounter (HOSPITAL_BASED_OUTPATIENT_CLINIC_OR_DEPARTMENT_OTHER): Payer: 59 | Admitting: General Surgery

## 2021-09-22 DIAGNOSIS — I872 Venous insufficiency (chronic) (peripheral): Secondary | ICD-10-CM | POA: Diagnosis not present

## 2021-09-22 DIAGNOSIS — L97822 Non-pressure chronic ulcer of other part of left lower leg with fat layer exposed: Secondary | ICD-10-CM | POA: Diagnosis not present

## 2021-09-22 DIAGNOSIS — L97828 Non-pressure chronic ulcer of other part of left lower leg with other specified severity: Secondary | ICD-10-CM | POA: Diagnosis not present

## 2021-09-22 DIAGNOSIS — M1990 Unspecified osteoarthritis, unspecified site: Secondary | ICD-10-CM | POA: Diagnosis not present

## 2021-09-22 DIAGNOSIS — E669 Obesity, unspecified: Secondary | ICD-10-CM | POA: Diagnosis not present

## 2021-09-22 DIAGNOSIS — I10 Essential (primary) hypertension: Secondary | ICD-10-CM | POA: Diagnosis not present

## 2021-09-22 DIAGNOSIS — L817 Pigmented purpuric dermatosis: Secondary | ICD-10-CM | POA: Diagnosis not present

## 2021-09-22 DIAGNOSIS — I739 Peripheral vascular disease, unspecified: Secondary | ICD-10-CM | POA: Diagnosis not present

## 2021-09-22 DIAGNOSIS — I87332 Chronic venous hypertension (idiopathic) with ulcer and inflammation of left lower extremity: Secondary | ICD-10-CM | POA: Diagnosis not present

## 2021-09-22 DIAGNOSIS — Z6841 Body Mass Index (BMI) 40.0 and over, adult: Secondary | ICD-10-CM | POA: Diagnosis not present

## 2021-09-22 NOTE — Progress Notes (Signed)
[name], [name] ([id number]) Visit Report for 09/22/2021 Chief Complaint Document Details Patient Name: Date of Service: [name], [name] 09/22/2021 2:30 PM Medical Record Number: [id number] Patient Account Number: [id number] Date of Birth/Sex: Treating RN: 08/28/1955 (66 y.o. [name] Primary Care Provider: [name] Other Clinician: Referring Provider: Treating Provider/Extender: [name] in Treatment: 30 Information Obtained from: Patient Chief Complaint 04/19/2021: The patient is here for ongoing follow-up regarding 2 left lower extremity wounds. Electronic Signature(s) Signed: 09/22/2021 3:27:14 PM By: [name] MD FACS Entered By: [name] on 09/22/2021 15:27:14 -------------------------------------------------------------------------------- Debridement Details Patient Name: Date of Service: Erin Gerold [name] 09/22/2021 2:30 PM Medical Record Number: [id number] Patient Account Number: [id number] Date of Birth/Sex: Treating RN: May 06, 1955 (66 y.o. [name] Primary Care Provider: [name] Other Clinician: Referring Provider: Treating Provider/Extender: [name] in Treatment: 30 Debridement Performed for Assessment: Wound #3 Left,Lateral Lower Leg Performed By: Physician [name], MD Debridement Type: Debridement Severity of Tissue Pre Debridement: Fat layer exposed Level of Consciousness (Pre-procedure): Awake and Alert Pre-procedure Verification/Time Out Yes - 15:07 Taken: Start Time: 15:07 Pain Control: Lidocaine 5% topical ointment T Area Debrided (L x W): otal 0.9 (cm) x 1.2 (cm) = 1.08 (cm) Tissue and other material debrided: Non-Viable, Slough, Slough Level: Non-Viable Tissue Debridement Description: Selective/Open Wound Instrument: Curette Bleeding: Minimum Hemostasis Achieved: Pressure Procedural Pain: 0 Post Procedural Pain: 0 Response to  Treatment: Procedure was tolerated well Level of Consciousness (Post- Awake and Alert procedure): Post Debridement Measurements of Total Wound Length: (cm) 0.9 Width: (cm) 1.2 Depth: (cm) 0.1 Volume: (cm) 0.085 Character of Wound/Ulcer Post Debridement: Improved Severity of Tissue Post Debridement: Fat layer exposed Post Procedure Diagnosis Same as Pre-procedure Electronic Signature(s) Signed: 09/22/2021 3:37:27 PM By: [name] MD FACS Signed: 09/22/2021 4:07:48 PM By: Adline Peals Entered By: Adline Peals on 09/22/2021 15:09:09 -------------------------------------------------------------------------------- HPI Details Patient Name: Date of Service: Erin Gerold [name] 09/22/2021 2:30 PM Medical Record Number: [id number] Patient Account Number: [id number] Date of Birth/Sex: Treating RN: August 02, 1955 (66 y.o. 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 She tells [name] that she fell on the gravel driveway in July. She developed injuries on her distal lower leg which have not healed. She saw her primary physician on 11/15/2017 who noted her left shin injuries. Gave her antibiotics. At that point the wounds were almost circumferential however most were less than 1.5 cm. Weeping edema fluid was noted. She was referred here for evaluation. The patient has a history of chronic lower extremity edema. 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 She has had prior history with leg weeping fluid. She does not wear compression stockings. She is not doing anything specific to these wound areas. The  patient has a history of obesity, arthritis, peripheral vascular disease hypertension lower extremity edema and Schamberg's disease ABI in our clinic was 1.3 on the left 12/17/2017; patient readmitted to the clinic last week. 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 She also has lymphedema. Put her in 3 layer compression and silver alginate last week. She has 3 small wounds with depth just lateral to  the tibia. More problematically than this she has numerous shallow areas some of which are almost canal like in shape with tightly adherent painful debris. It would be very difficult and time- consuming to go through this and attempt to individually debride all these areas.. I changed her to collagen today to see if that would help with any of the surface debris on some of these wounds. Otherwise we will not be able to put this in compression we have to have someone change the dressing. The patient is not eligible for home health 12/25/17 on evaluation today patient actually appears to be doing rather well in regard to the ulcer on her lower extremity. Fortunately there does not appear to be evidence of infection at this time. She has been tolerating the dressing changes without complication. This includes the compression wrap. The only issue she had was that the wrap was initially placed over her bunion region which actually calls her some discomfort and pain. Other than that things seem to be going rather well. 01/01/2018 Seen today for follow-up and management of left lower extremity wound and lymphedema. T oday she presents with a new wound towards to the left lateral LE. Recently treated with a 7 day course of amoxicillin; reason for antibiotic dose is unknown at this time. Tolerating current treatment of collagen with 4- layer wraps. She obtained a venous reflux study on 12/25/17. Studies show on the right abnormal reflux times of the popliteal  vein, great saphenous vein at the saphenofemoral junction at the proximal thigh, great saphenous vein at the mid calf, and origin of the small saphenous vein.No superficial thrombosis. No deep vein thrombosis in the common femoral, femoral,and popliteal veins. Left abnormal reflex times as well of the common femoral vein, popliteal vein, and a great saphenous vein at the saphenofemoral junction, In great saphenous vein at the mid thigh w/o thrombosis. Has any issues or concerns during visit today. Recommended follow-up to vascular specialist due to abnormalities from the venous reflux study. Denies fever, pain, chills, dizziness, nausea, or vomiting. 01/08/18 upon evaluation today the patient actually seems to be showing some signs of improvement in my opinion at this point in regard to the lower extremity ulcerated areas. She still has a lot of drainage but fortunately nothing that appears to be too significant currently. I have been very happy with the overall progress I see today compared to where things were during the last evaluation that I had with her. Nonetheless she has not had her appointment with the vein specialist as of yet in fact we were able to get this approved for her today and confirmed with them she will be seeing them on December 26. Nonetheless in general I do feel like the compression wraps is doing well for her. 01/17/18; quite a bit of improvement since last time I saw this patient she has a small open area remaining on the left lateral calf and even smaller area medially. She has lymphedema chronic stasis changes with distal skin fibrosis. She has an appointment with vascular surgery later this month 01/24/2018; the patient's medial leg has closed. Still a small open area on the left lateral leg. She states that the 4 layer compression we put on last week was too tight and she had to take it off over a few days ago. We have had resultant increase in her lymphedema in the dorsal  foot and a proximal calf. Fortunately that does not seem to have resulted in any deterioration in  her wounds The patient is going to need compression stockings. We have given her measurements to phone elastic therapy in [place]. She has vascular surgery consult on December 26 02/07/18; she's had continuous contraction on the left lateral leg wound which is now very small. Currently using silver alginate under 3 layer compression She saw Dr. Trula Slade of vascular surgery on 02/06/18. It was noted that she had a normal reflux times in the popliteal vein, great saphenous vein at the saphenofemoral junction, great saphenous vein at the proximal thigh great saphenous vein at the mid calf and origin of the small saphenous vein. It was noted that she had significant reflux in the left saphenous veins with diameter measurements in the 0.7-0.8 cm range. It was felt she would benefit from laser ablation to help minimize the risk of ulcer recurrence. It was recommended that she wear 20-30 thigh-high compression stockings and have follow-up in 4-6 weeks 02/17/2018; I thought this lady would be healed however her compression slipped down and she developed increasing swelling and the wound is actually larger. 1/13; we had deterioration last week after the patient's compression slipped down and she developed periwound swelling. I increased her compression before layers we have been using silver alginate we are a lot better again today. She has her compression stockings in waiting 1/23; the patient's wounds are totally healed today. She has her stockings. This was almost circumferential skin damage. She follows up with Dr. Trula Slade of vascular surgery next Monday. READMISSION 02/21/2021 This is a now 66 year old woman that we had in clinic here discharging in January 2020 with wounds on her left calf chronic venous insufficiency. She was discharged with 30/40 stockings. It does not sound like she has worn stockings in  about a year largely from not being able to get them on herself. In November she developed new blisters on her legs an area laterally is opened into a fairly sizable wound. She has weeping posteriorly as well. She has been using Neosporin and Band-Aids. The patient did see Dr. Trula Slade in 2019 and 2020. He felt she might benefit from laser ablation in her left saphenous veins although because of the wound that healed I do not think he went through with it. She might benefit from seeing him again. Her ABI on the left is 1. 1/17; patient's wound on the posterior left calf is closed she has the 2 large areas last week. We put her in 4-layer compression for edema control is a lot better. Our intake nurse noted greenish drainage and odor. We have been using silver alginate 1/25; PCR culture I did have the substantial wound area on the left lateral lower leg showed staff aureus and group A strep. Low titers of coag negative staph which are probably skin contaminants. Resistance detected to tetracycline methicillin and macrolides. I gave her a starter kit of Nuzyra 150 mg x 3 for 2 days then 300 mg for a further 7 days. Marked odor considerable increase in surrounding erythema. We are using Iodoflex last week however I have changed to silver alginate with underlying Bactroban 2/1; she is completing her [place] tomorrow. The degree of erythema around the wounds looks a lot better. Odor has improved. I gave her silver alginate and Bactroban last week and changing her back to Iodoflex to continue with ongoing debridement 2/8; the periwound looks a lot better. Surface of the wound also looks somewhat better although there is still ongoing debridement to be done we have been using Iodoflex under compression.  Primary dressing and silver alginate 2/15; left posterior calf. Some improvement in the surface of the wound but still very gritty we have been using Iodoflex under compression. 04/05/2021: Left lateral  posterior calf. She continues to have significant amounts of drainage, some of which is probably comprised of the Iodoflex microbleeds. There is no odor to the drainage. The satellite lesion on the more posterior aspect of the calf is epithelializing nicely and has contracted quite a bit. There is robust granulation tissue in the dominant wound with minimal adherent slough. 04/12/2021: The satellite lesion has nearly closed. She continues to have good granulation tissue with minimal slough of the larger primary wound. She continues to have a fair amount of drainage, but I think this is less secondary to switching her dressing from Iodoflex to Prisma. 04/19/2021: The satellite lesion is almost completely epithelialized. The larger primary wound continues to contract with good granulation tissue and minimal slough. She is currently in [place] with compression. 04/26/2021: The satellite lesion has closed. The larger primary wound has contracted further and the granulation tissue is robust without being hypertrophic. Minimal slough is present. 05/03/2021: The satellite lesion remains closed. The larger primary wound has a bit of slough, but has also contracted further with good perimeter epithelialization. Good granulation tissue at the wound surface. There was some greenish drainage on the dressing when it was removed; it is not the blue-green typically associated with Pseudomonas aeruginosa, however. 05/10/2021: The primary wound continues to contract. There is minimal slough. The granulation tissue at 9:00 is a bit hypertrophic. No significant drainage. No odor. 05/17/2021: The wound is a little bit smaller today with good granulation tissue. Minimal slough. No significant drainage or odor. 05/24/2021: The wound continues to contract and has a nice base of granulation tissue. Small amount of slough. No concern for infection. 05/31/2021: For some reason, the wound measured slightly larger today but overall it  still appears to be in good condition with a nice base of granulation tissue and minimal slough. 06/07/2021: The wound is smaller today. Good granulation tissue and minimal slough. 06/14/2021: The wound is unchanged in size. She continues to accumulate some slough. Good granulation tissue on the surface. 06/20/2021: The wound is smaller today. Minimal slough with good granulation tissue. 06/27/2021: The wound on her left lateral leg is smaller today with just a bit of slough and eschar accumulation. Unfortunately, she has opened a new superficial wound on her right lower extremity. She has 3+ pitting edema to the knees on that leg and she does not wear compression stockings. 07/04/2021: In addition to the superficial wound on her right lower extremity that she opened up last week, she has opened 3 additional sites on the same leg. Her compression wraps were clearly not in correct position when she came to clinic today as they were at about the mid calf level rather than to the tibial tuberosity. She says that they slipped earlier and she had to come back on Friday to have them redone. The wound on her left lateral leg is perhaps slightly larger with some slough accumulation. 07/11/2021: The superficial wounds on her right lower extremity are nearly closed. They just have a thin layer of eschar overlying them. The wound on her left lateral leg is a little bit shallower and has a bit of slough accumulation. 07/17/2021: The right medial lower extremity leg wounds have almost completely closed; one of them just has a tiny opening with a little bit of serous drainage. The left  lateral leg wound is really unchanged. It seems to be stalled. 07/24/2021: The right leg wounds are completely closed. The left lateral leg wound is actually bigger today. It is a bit more tender. There is a minor accumulation of slough on the surface. 07/31/2021: The culture that I took last week was positive for MRSA. We added mupirocin  under the silver alginate and I prescribed doxycycline. She has been tolerating this well. The wound is smaller today but does have slough accumulation. No significant pain or drainage. 08/07/2021: She completed her course of doxycycline. The wound looks much better today. It is smaller and the periwound is less inflamed. She does have some slough accumulation on the surface. 08/14/2021: The wound continues to contract. There is slough on the wound surface, but the periwound is intact without inflammation or induration. 08/21/2021: The wound is down to just 2 small open sites. They both have a bit of slough accumulation. 08/28/2021: The wound is down to just 1 small open site with a little bit of slough and eschar accumulation. 09/04/2021: The wound continues to contract but remains open. It is clean without any slough. 09/12/2021: No real change in the overall wound dimensions, but it is flush with the surrounding skin. There is a little bit of slough accumulation. Edema control is good. 09/22/2021: The wound is smaller and even more superficial. Minimal slough accumulation. Good control of edema. Electronic Signature(s) Signed: 09/22/2021 3:27:45 PM By: [name] MD FACS Entered By: [name] on 09/22/2021 15:27:45 -------------------------------------------------------------------------------- Physical Exam Details Patient Name: Date of Service: Erin Gerold [name] 09/22/2021 2:30 PM Medical Record Number: [id number] Patient Account Number: [id number] Date of Birth/Sex: Treating RN: Aug 12, 1955 (66 y.o. [name] Primary Care Provider: [name] Other Clinician: Referring Provider: Treating Provider/Extender: [name] in Treatment: 30 Constitutional . . . . No acute distress.Marland Kitchen Respiratory Normal work of breathing on room air.. Notes 09/22/2021: The wound is smaller and even more superficial. Minimal slough accumulation. Good control of  edema. Electronic Signature(s) Signed: 09/22/2021 3:29:04 PM By: [name] MD FACS Entered By: [name] on 09/22/2021 15:29:04 -------------------------------------------------------------------------------- Physician Orders Details Patient Name: Date of Service: Erin Gerold [name] 09/22/2021 2:30 PM Medical Record Number: [id number] Patient Account Number: [id number] Date of Birth/Sex: Treating RN: January 05, 1956 (66 y.o. [name] Primary Care Provider: [name] Other Clinician: Referring Provider: Treating Provider/Extender: [name] in Treatment: 73 Verbal / Phone Orders: No Diagnosis Coding ICD-10 Coding Code Description [phone] Chronic venous hypertension (idiopathic) with ulcer and inflammation of left lower extremity L97.828 Non-pressure chronic ulcer of other part of left lower leg with other specified severity Follow-up Appointments ppointment in 1 week. - Dr. Celine Ahr Room 3 - 8/18 at 1:15 PM Return A Bathing/ Shower/ Hygiene May shower with protection but do not get wound dressing(s) wet. - Ok to use [employer], can purchase at [facility], [facility], or [employer] Edema Control - Lymphedema / SCD / Other Elevate legs to the level of the heart or above for 30 minutes daily and/or when sitting, a frequency of: - throughout the day Avoid standing for long periods of time. Exercise regularly Compression stocking or Garment 20-30 mm/Hg pressure to: - Brochure for Elastic Therapy. Leg measurements included Wound Treatment Wound #3 - Lower Leg Wound Laterality: Left, Lateral Cleanser: Soap and Water 1 x Per Week/30 Days Discharge Instructions: May shower and wash wound with dial antibacterial soap and water prior to dressing change. Cleanser: Wound Cleanser  1 x Per Week/30 Days Discharge Instructions: Cleanse the wound with wound cleanser prior to applying a clean dressing using gauze sponges, not tissue or cotton  balls. Peri-Wound Care: Sween Lotion (Moisturizing lotion) 1 x Per Week/30 Days Discharge Instructions: Apply moisturizing lotion as directed Prim Dressing: Promogran Prisma Matrix, 4.34 (sq in) (silver collagen) 1 x Per Week/30 Days ary Discharge Instructions: Moisten collagen with saline or hydrogel Secondary Dressing: Zetuvit Plus 4x8 in 1 x Per Week/30 Days Discharge Instructions: Apply over primary dressing as directed. Compression Wrap: CoFlex TLC XL 2-layer Compression System 4x7 (in/yd) 1 x Per Week/30 Days Discharge Instructions: Apply CoFlex 2-layer compression as directed. (alt for 4 layer) Electronic Signature(s) Signed: 09/22/2021 3:37:27 PM By: [name] MD FACS Entered By: [name] on 09/22/2021 15:29:16 -------------------------------------------------------------------------------- Problem List Details Patient Name: Date of Service: Erin Gerold [name] 09/22/2021 2:30 PM Medical Record Number: [id number] Patient Account Number: [id number] Date of Birth/Sex: Treating RN: [birth year] (66 y.o. [name] Primary Care Provider: [name] Other Clinician: Referring Provider: Treating Provider/Extender: [name] in Treatment: 30 Active Problems ICD-10 Encounter Code Description Active Date MDM Diagnosis 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 (idiopathic) with ulcer and inflammation of left 02/21/2021 No Yes lower extremity L97.828 Non-pressure chronic ulcer of other part of left lower leg with other specified 02/21/2021 No Yes severity Inactive Problems ICD-10 Code Description Active Date Inactive Date L03.116 Cellulitis of left lower limb 03/08/2021 03/08/2021 L97.811 Non-pressure chronic ulcer of other part of right lower leg limited to breakdown of skin 06/27/2021 06/27/2021 Resolved Problems Electronic Signature(s) Signed: 09/22/2021 3:26:23 PM By: [name] MD FACS Entered By: [name] on 09/22/2021  15:26:23 -------------------------------------------------------------------------------- Progress Note Details Patient Name: Date of Service: Erin Gerold [name] 09/22/2021 2:30 PM Medical Record Number: [id number] Patient Account Number: [id number] Date of Birth/Sex: Treating RN: 01-15-1956 (66 y.o. [name] Primary Care Provider: [name] Other Clinician: Referring Provider: Treating Provider/Extender: [name] in Treatment: 30 Subjective Chief Complaint Information obtained from Patient 04/19/2021: The patient is here for ongoing follow-up regarding 2 left lower extremity wounds. 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 She tells [name] that she fell on the gravel driveway in July. She developed injuries on her distal lower leg which have not healed. She saw her primary physician on 11/15/2017 who noted her left shin injuries. Gave her antibiotics. At that point the wounds were almost circumferential however most were less than 1.5 cm. Weeping edema fluid was noted. She was referred here for evaluation. The patient has a history of chronic lower extremity edema. 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 She has had prior history with leg weeping fluid. She does not wear compression stockings. She is not doing anything specific to these wound areas. 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 She also has lymphedema. Put her in 3 layer compression and silver alginate last week.  She has 3 small wounds with depth just lateral to the tibia. More problematically than this she has numerous shallow areas some of which are almost  canal like in shape with tightly adherent painful debris. It would be very difficult and time- consuming to go through this and attempt to individually debride all these areas.. I changed her to collagen today to see if that would help with any of the surface debris on some of these wounds. Otherwise we will not be able to put this in compression we have to have someone change the dressing. The patient is not eligible for home health 12/25/17 on evaluation today patient actually appears to be doing rather well in regard to the ulcer on her lower extremity. Fortunately there does not appear to be evidence of infection at this time. She has been tolerating the dressing changes without complication. This includes the compression wrap. The only issue she had was that the wrap was initially placed over her bunion region which actually calls her some discomfort and pain. Other than that things seem to be going rather well. 01/01/2018 Seen today for follow-up and management of left lower extremity wound and lymphedema. T oday she presents with a new wound towards to the left lateral LE. Recently treated with a 7 day course of amoxicillin; reason for antibiotic dose is unknown at this time. Tolerating current treatment of collagen with 4- layer wraps. She obtained a venous reflux study on 12/25/17. Studies show on the right abnormal reflux times of the popliteal vein, great saphenous vein at the saphenofemoral junction at the proximal thigh, great saphenous vein at the mid calf, and origin of the small saphenous vein.No superficial thrombosis. No deep vein thrombosis in the common femoral, femoral,and popliteal veins. Left abnormal reflex times as well of the common femoral vein, popliteal vein, and a great saphenous vein at the saphenofemoral junction, In great  saphenous vein at the mid thigh w/o thrombosis. Has any issues or concerns during visit today. Recommended follow-up to vascular specialist due to abnormalities from the venous reflux study. Denies fever, pain, chills, dizziness, nausea, or vomiting. 01/08/18 upon evaluation today the patient actually seems to be showing some signs of improvement in my opinion at this point in regard to the lower extremity ulcerated areas. She still has a lot of drainage but fortunately nothing that appears to be too significant currently. I have been very happy with the overall progress I see today compared to where things were during the last evaluation that I had with her. Nonetheless she has not had her appointment with the vein specialist as of yet in fact we were able to get this approved for her today and confirmed with them she will be seeing them on December 26. Nonetheless in general I do feel like the compression wraps is doing well for her. 01/17/18; quite a bit of improvement since last time I saw this patient she has a small open area remaining on the left lateral calf and even smaller area medially. She has lymphedema chronic stasis changes with distal skin fibrosis. She has an appointment with vascular surgery later this month 01/24/2018; the patient's medial leg has closed. Still a small open area on the left lateral leg. She states that the 4 layer compression we put on last week was too tight and she had to take it off over a few days ago. We have had resultant increase in her lymphedema in the dorsal foot and a proximal calf. Fortunately that does not seem to have resulted in any deterioration in her wounds The patient is going to need compression stockings. We have given her measurements to phone  elastic therapy in [place]. She has vascular surgery consult on December 26 02/07/18; she's had continuous contraction on the left lateral leg wound which is now very small. Currently using silver  alginate under 3 layer compression She saw Dr. Trula Slade of vascular surgery on 02/06/18. It was noted that she had a normal reflux times in the popliteal vein, great saphenous vein at the saphenofemoral junction, great saphenous vein at the proximal thigh great saphenous vein at the mid calf and origin of the small saphenous vein. It was noted that she had significant reflux in the left saphenous veins with diameter measurements in the 0.7-0.8 cm range. It was felt she would benefit from laser ablation to help minimize the risk of ulcer recurrence. It was recommended that she wear 20-30 thigh-high compression stockings and have follow-up in 4-6 weeks 02/17/2018; I thought this lady would be healed however her compression slipped down and she developed increasing swelling and the wound is actually larger. 1/13; we had deterioration last week after the patient's compression slipped down and she developed periwound swelling. I increased her compression before layers we have been using silver alginate we are a lot better again today. She has her compression stockings in waiting 1/23; the patient's wounds are totally healed today. She has her stockings. This was almost circumferential skin damage. She follows up with Dr. Trula Slade of vascular surgery next Monday. READMISSION 02/21/2021 This is a now 66 year old woman that we had in clinic here discharging in January 2020 with wounds on her left calf chronic venous insufficiency. She was discharged with 30/40 stockings. It does not sound like she has worn stockings in about a year largely from not being able to get them on herself. In November she developed new blisters on her legs an area laterally is opened into a fairly sizable wound. She has weeping posteriorly as well. She has been using Neosporin and Band-Aids. The patient did see Dr. Trula Slade in 2019 and 2020. He felt she might benefit from laser ablation in her left saphenous veins although because of  the wound that healed I do not think he went through with it. She might benefit from seeing him again. Her ABI on the left is 1. 1/17; patient's wound on the posterior left calf is closed she has the 2 large areas last week. We put her in 4-layer compression for edema control is a lot better. Our intake nurse noted greenish drainage and odor. We have been using silver alginate 1/25; PCR culture I did have the substantial wound area on the left lateral lower leg showed staff aureus and group A strep. Low titers of coag negative staph which are probably skin contaminants. Resistance detected to tetracycline methicillin and macrolides. I gave her a starter kit of Nuzyra 150 mg x 3 for 2 days then 300 mg for a further 7 days. Marked odor considerable increase in surrounding erythema. We are using Iodoflex last week however I have changed to silver alginate with underlying Bactroban 2/1; she is completing her [place] tomorrow. The degree of erythema around the wounds looks a lot better. Odor has improved. I gave her silver alginate and Bactroban last week and changing her back to Iodoflex to continue with ongoing debridement 2/8; the periwound looks a lot better. Surface of the wound also looks somewhat better although there is still ongoing debridement to be done we have been using Iodoflex under compression. Primary dressing and silver alginate 2/15; left posterior calf. Some improvement in the surface of the  wound but still very gritty we have been using Iodoflex under compression. 04/05/2021: Left lateral posterior calf. She continues to have significant amounts of drainage, some of which is probably comprised of the Iodoflex microbleeds. There is no odor to the drainage. The satellite lesion on the more posterior aspect of the calf is epithelializing nicely and has contracted quite a bit. There is robust granulation tissue in the dominant wound with minimal adherent slough. 04/12/2021: The satellite  lesion has nearly closed. She continues to have good granulation tissue with minimal slough of the larger primary wound. She continues to have a fair amount of drainage, but I think this is less secondary to switching her dressing from Iodoflex to Prisma. 04/19/2021: The satellite lesion is almost completely epithelialized. The larger primary wound continues to contract with good granulation tissue and minimal slough. She is currently in [place] with compression. 04/26/2021: The satellite lesion has closed. The larger primary wound has contracted further and the granulation tissue is robust without being hypertrophic. Minimal slough is present. 05/03/2021: The satellite lesion remains closed. The larger primary wound has a bit of slough, but has also contracted further with good perimeter epithelialization. Good granulation tissue at the wound surface. There was some greenish drainage on the dressing when it was removed; it is not the blue-green typically associated with Pseudomonas aeruginosa, however. 05/10/2021: The primary wound continues to contract. There is minimal slough. The granulation tissue at 9:00 is a bit hypertrophic. No significant drainage. No odor. 05/17/2021: The wound is a little bit smaller today with good granulation tissue. Minimal slough. No significant drainage or odor. 05/24/2021: The wound continues to contract and has a nice base of granulation tissue. Small amount of slough. No concern for infection. 05/31/2021: For some reason, the wound measured slightly larger today but overall it still appears to be in good condition with a nice base of granulation tissue and minimal slough. 06/07/2021: The wound is smaller today. Good granulation tissue and minimal slough. 06/14/2021: The wound is unchanged in size. She continues to accumulate some slough. Good granulation tissue on the surface. 06/20/2021: The wound is smaller today. Minimal slough with good granulation tissue. 06/27/2021: The  wound on her left lateral leg is smaller today with just a bit of slough and eschar accumulation. Unfortunately, she has opened a new superficial wound on her right lower extremity. She has 3+ pitting edema to the knees on that leg and she does not wear compression stockings. 07/04/2021: In addition to the superficial wound on her right lower extremity that she opened up last week, she has opened 3 additional sites on the same leg. Her compression wraps were clearly not in correct position when she came to clinic today as they were at about the mid calf level rather than to the tibial tuberosity. She says that they slipped earlier and she had to come back on Friday to have them redone. The wound on her left lateral leg is perhaps slightly larger with some slough accumulation. 07/11/2021: The superficial wounds on her right lower extremity are nearly closed. They just have a thin layer of eschar overlying them. The wound on her left lateral leg is a little bit shallower and has a bit of slough accumulation. 07/17/2021: The right medial lower extremity leg wounds have almost completely closed; one of them just has a tiny opening with a little bit of serous drainage. The left lateral leg wound is really unchanged. It seems to be stalled. 07/24/2021: The right leg wounds  are completely closed. The left lateral leg wound is actually bigger today. It is a bit more tender. There is a minor accumulation of slough on the surface. 07/31/2021: The culture that I took last week was positive for MRSA. We added mupirocin under the silver alginate and I prescribed doxycycline. She has been tolerating this well. The wound is smaller today but does have slough accumulation. No significant pain or drainage. 08/07/2021: She completed her course of doxycycline. The wound looks much better today. It is smaller and the periwound is less inflamed. She does have some slough accumulation on the surface. 08/14/2021: The wound  continues to contract. There is slough on the wound surface, but the periwound is intact without inflammation or induration. 08/21/2021: The wound is down to just 2 small open sites. They both have a bit of slough accumulation. 08/28/2021: The wound is down to just 1 small open site with a little bit of slough and eschar accumulation. 09/04/2021: The wound continues to contract but remains open. It is clean without any slough. 09/12/2021: No real change in the overall wound dimensions, but it is flush with the surrounding skin. There is a little bit of slough accumulation. Edema control is good. 09/22/2021: The wound is smaller and even more superficial. Minimal slough accumulation. Good control of edema. Patient History Information obtained from Patient. Family History Heart Disease - Mother,Father, Hypertension - Mother,Father, Kidney Disease - Mother, Thyroid Problems - Mother, No family history of Cancer, Diabetes, Hereditary Spherocytosis, Lung Disease, Seizures, Stroke, Tuberculosis. Social History Never smoker, Marital Status - Single, Alcohol Use - Never, Drug Use - No History, Caffeine Use - Daily - coffee. Medical History Eyes Patient has history of Cataracts Hematologic/Lymphatic Patient has history of Lymphedema Cardiovascular Patient has history of Hypertension, Peripheral Venous Disease Integumentary (Skin) Denies history of History of Burn Musculoskeletal Patient has history of Osteoarthritis Hospitalization/Surgery History - colostomy reversal. 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 Medical A Surgical History Notes nd Constitutional Symptoms (General Health) morbid obesity Cardiovascular schamberg disease, hyperlipidemia Gastrointestinal diverticulitis , h/o obstruction due to diverticulitis , colostomy and colostomy reversal Endocrine hypothyroidism Objective Constitutional No acute distress.. Vitals Time Taken: 2:47 PM, Height: 62 in, Weight: 226 lbs, BMI: 41.3,  Temperature: 97.8 F, Pulse: 81 bpm, Respiratory Rate: 18 breaths/min, Blood Pressure: 126/78 mmHg. Respiratory Normal work of breathing on room air.. General Notes: 09/22/2021: The wound is smaller and even more superficial. Minimal slough accumulation. Good control of edema. Integumentary (Hair, Skin) Wound #3 status is Open. Original cause of wound was Blister. The date acquired was: 12/13/2020. The wound has been in treatment 30 weeks. The wound is located on the Left,Lateral Lower Leg. The wound measures 0.9cm length x 1.2cm width x 0.1cm depth; 0.848cm^2 area and 0.085cm^3 volume. There is a medium amount of serosanguineous drainage noted. The wound margin is distinct with the outline attached to the wound base. There is small (1-33%) pink granulation within the wound bed. There is a large (67-100%) amount of necrotic tissue within the wound bed including Adherent Slough. 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 There was a Selective/Open Wound Non-Viable Tissue Debridement with a total area of 1.08 sq cm performed by [name], MD.  With the following instrument(s): Curette to remove Non-Viable tissue/material. Material removed includes [facility] after achieving pain control using Lidocaine 5% topical ointment. No specimens were taken. A time out was conducted at 15:07, prior to the start of the procedure. A Minimum amount of bleeding was controlled with Pressure. The procedure was tolerated well with a pain level of 0 throughout and a pain level of 0 following the procedure. Post Debridement Measurements: 0.9cm length x 1.2cm width x 0.1cm  depth; 0.085cm^3 volume. Character of Wound/Ulcer Post Debridement is improved. Severity of Tissue Post Debridement is: Fat layer exposed. Post procedure Diagnosis Wound #3: Same as Pre-Procedure Plan Follow-up Appointments: Return Appointment in 1 week. - Dr. Celine Ahr Room 3 - 8/18 at 1:15 PM Bathing/ Shower/ Hygiene: May shower with protection but do not get wound dressing(s) wet. - Ok to use [employer], can purchase at [facility], [facility], or [employer] Edema Control - Lymphedema / SCD / Other: Elevate legs to the level of the heart or above for 30 minutes daily and/or when sitting, a frequency of: - throughout the day Avoid standing for long periods of time. Exercise regularly Compression stocking or Garment 20-30 mm/Hg pressure to: - Brochure for Elastic Therapy. Leg measurements included WOUND #3: - Lower Leg Wound Laterality: Left, Lateral Cleanser: Soap and Water 1 x Per Week/30 Days Discharge Instructions: May shower and wash wound with dial antibacterial soap and water prior to dressing change. Cleanser: Wound Cleanser 1 x Per Week/30 Days Discharge Instructions: Cleanse the wound with wound cleanser prior to applying a clean dressing using gauze sponges, not tissue or cotton balls. Peri-Wound Care: Sween Lotion (Moisturizing lotion) 1 x Per Week/30 Days Discharge Instructions: Apply moisturizing lotion as directed Prim Dressing: Promogran Prisma Matrix, 4.34 (sq in) (silver collagen) 1 x Per Week/30 Days ary Discharge Instructions: Moisten collagen with saline or hydrogel Secondary Dressing: Zetuvit Plus 4x8 in 1 x Per Week/30 Days Discharge Instructions: Apply over primary dressing as directed. Com pression Wrap: CoFlex TLC XL 2-layer Compression System 4x7 (in/yd) 1 x Per Week/30 Days Discharge Instructions: Apply CoFlex 2-layer compression as directed. (alt for 4 layer) 09/22/2021: The wound is smaller and even more superficial. Minimal slough accumulation. Good control of edema. I  used a curette to debride the slough from the wound surface. We will continue using Prisma silver collagen and Coflex compression. Follow-up in 1 week. Electronic Signature(s) Signed: 09/22/2021 3:29:39 PM By: [name] MD FACS Entered By: [name] on 09/22/2021 15:29:39 -------------------------------------------------------------------------------- HxROS Details Patient Name: Date of Service: Erin Gerold [name] 09/22/2021 2:30 PM Medical Record Number: [id number] Patient Account Number: [id number] Date of Birth/Sex: Treating RN: February 20, 1955 (66 y.o. [name] Primary Care Provider: [name] Other Clinician: Referring Provider: Treating Provider/Extender: [name] in Treatment: 22 Information Obtained From Patient Constitutional Symptoms (General Health) Medical History: Past Medical History Notes: morbid obesity Eyes Medical History: Positive for: Cataracts Hematologic/Lymphatic Medical History: Positive for: Lymphedema Cardiovascular Medical History: Positive for: Hypertension; Peripheral Venous Disease Past Medical History Notes: schamberg disease, hyperlipidemia Gastrointestinal Medical History: Past Medical History Notes: diverticulitis , h/o obstruction due to diverticulitis , colostomy and colostomy reversal Endocrine Medical History: Past Medical History Notes: hypothyroidism Integumentary (Skin) Medical History: Negative for: History of Burn Musculoskeletal Medical History: Positive for: Osteoarthritis HBO Extended History Items Eyes: Cataracts Immunizations Pneumococcal Vaccine: Received Pneumococcal Vaccination: Yes Received Pneumococcal Vaccination On or After 60th Birthday: Yes Implantable Devices None Hospitalization / Surgery History Type of Hospitalization/Surgery colostomy reversal colostomy due  to diverticulitis Family and Social History Cancer: No; Diabetes: No; Heart Disease:  Yes - Mother,Father; Hereditary Spherocytosis: No; Hypertension: Yes - Mother,Father; Kidney Disease: Yes - Mother; Lung Disease: No; Seizures: No; Stroke: No; Thyroid Problems: Yes - Mother; Tuberculosis: No; Never smoker; Marital Status - Single; Alcohol Use: Never; Drug Use: No History; Caffeine Use: Daily - coffee; Financial Concerns: No; Food, Clothing or Shelter Needs: No; Support System Lacking: No; Transportation Concerns: No Electronic Signature(s) Signed: 09/22/2021 3:37:27 PM By: [name] MD FACS Signed: 09/22/2021 4:33:41 PM By: [name] RN Entered By: [name] on 09/22/2021 15:27:53 -------------------------------------------------------------------------------- SuperBill Details Patient Name: Date of Service: Erin Face D. 09/22/2021 Medical Record Number: [id number] Patient Account Number: [id number] Date of Birth/Sex: Treating RN: 18-Feb-1955 (66 y.o. [name] Primary Care Provider: [name] Other Clinician: Referring Provider: Treating Provider/Extender: [name] in Treatment: 30 Diagnosis Coding ICD-10 Codes Code Description 3438220105 Chronic venous hypertension (idiopathic) with ulcer and inflammation of left lower extremity L97.828 Non-pressure chronic ulcer of other part of left lower leg with other specified severity Facility Procedures CPT4 Code: [id number] Description: [id number] - [facility]BRIDE WOUND 1ST 20 SQ CM OR < ICD-10 Diagnosis Description L97.828 Non-pressure chronic ulcer of other part of left lower leg with other specified se Modifier: verity Quantity: 1 Physician Procedures : CPT4 Code Description Modifier [id number] - WC PHYS LEVEL 3 - EST PT 25 ICD-10 Diagnosis Description 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 (idiopathic) with ulcer and inflammation of left lower extremity L97.828 Non-pressure chronic ulcer of other  part of left lower leg with other specified severity Quantity: 1 :  [id number] - WC PHYS DEBR WO ANESTH 20 SQ CM ICD-10 Diagnosis Description L97.828 Non-pressure chronic ulcer of other part of left lower leg with other specified severity Quantity: 1 Electronic Signature(s) Signed: 09/22/2021 3:29:55 PM By: [name] MD FACS Entered By: [name] on 09/22/2021 15:29:55

## 2021-09-28 ENCOUNTER — Other Ambulatory Visit: Payer: Self-pay | Admitting: Nurse Practitioner

## 2021-09-28 NOTE — Progress Notes (Signed)
Erin, [name] ([id number]) Visit Report for 09/22/2021 Arrival Information Details Patient Name: Date of Service: [name], [name] 09/22/2021 2:30 PM Medical Record Number: [id number] Patient Account Number: [id number] Date of Birth/Sex: Treating RN: 1955/11/29 (66 y.o. [name] Primary Care Undine Nealis: [name] Other Clinician: Donavan Burnet Referring Lizvet Chunn: Treating Hebert Dooling/Extender: [name] in Treatment: 36 Visit Information History Since Last Visit All ordered tests and consults were completed: Yes Patient Arrived: Ambulatory Added or deleted any medications: No Arrival Time: 14:41 Any new allergies or adverse reactions: No Accompanied By: self Had a fall or experienced change in No Transfer Assistance: None activities of daily living that may affect Patient Identification Verified: Yes risk of falls: Secondary Verification Process Completed: Yes Signs or symptoms of abuse/neglect since last visito No Patient Requires Transmission-Based Precautions: No Hospitalized since last visit: No Patient Has Alerts: No Implantable device outside of the clinic excluding No cellular tissue based products placed in the center since last visit: Pain Present Now: No Electronic Signature(s) Signed: 09/27/2021 4:40:43 PM By: Donavan Burnet CHT EMT BS , , Entered By: Donavan Burnet on 09/22/2021 14:47:30 -------------------------------------------------------------------------------- Encounter Discharge Information Details Patient Name: Date of Service: Erin Face D. 09/22/2021 2:30 PM Medical Record Number: [id number] Patient Account Number: [id number] Date of Birth/Sex: Treating RN: 1955/09/19 (66 y.o. [name] Primary Care Shalev Helminiak: [name] Other Clinician: Referring Grettell Ransdell: Treating Walther Sanagustin/Extender: [name] in Treatment: 30 Encounter Discharge Information Items  Post Procedure Vitals Discharge Condition: Stable Temperature (F): 97.8 Ambulatory Status: Cane Pulse (bpm): 81 Discharge Destination: Home Respiratory Rate (breaths/min): 18 Transportation: Private Auto Blood Pressure (mmHg): 126/78 Accompanied By: self Schedule Follow-up Appointment: Yes Clinical Summary of Care: Patient Declined Electronic Signature(s) Signed: 09/22/2021 4:07:48 PM By: Adline Peals Entered By: Adline Peals on 09/22/2021 15:31:40 -------------------------------------------------------------------------------- Lower Extremity Assessment Details Patient Name: Date of Service: Erin, [name] 09/22/2021 2:30 PM Medical Record Number: [id number] Patient Account Number: [id number] Date of Birth/Sex: Treating RN: 1955-10-24 (66 y.o. [name] Primary Care Compton Brigance: [name] Other Clinician: Referring Rollins Wrightson: Treating Anhthu Perdew/Extender: [name] in Treatment: 30 Edema Assessment Assessed: [Left: No] [Right: No] Edema: [Left: Ye] [Right: s] Calf Left: Right: Point of Measurement: 29 cm From Medial Instep 39 cm Ankle Left: Right: Point of Measurement: 10 cm From Medial Instep 24.3 cm Vascular Assessment Pulses: Dorsalis Pedis Palpable: [Left:Yes] Electronic Signature(s) Signed: 09/22/2021 4:07:48 PM By: Adline Peals Entered By: Adline Peals on 09/22/2021 15:00:57 -------------------------------------------------------------------------------- Multi Wound Chart Details Patient Name: Date of Service: Erin Face D. 09/22/2021 2:30 PM Medical Record Number: [id number] Patient Account Number: [id number] Date of Birth/Sex: Treating RN: 1955-05-18 (66 y.o. [name] Primary Care Cortnie Ringel: [name] Other Clinician: Referring Noeli Lavery: Treating Geneive Sandstrom/Extender: [name] in Treatment: 30 Vital Signs Height(in): 62 Pulse(bpm):  81 Weight(lbs): 226 Blood Pressure(mmHg): 126/78 Body Mass Index(BMI): 41.3 Temperature(F): 97.8 Respiratory Rate(breaths/min): 18 Photos: [3:Left, Lateral Lower Leg] [N/A:N/A N/A] Wound Location: [3:Blister] [N/A:N/A] Wounding Event: [3:Venous Leg Ulcer] [N/A:N/A] Primary Etiology: [3:Cataracts, Lymphedema,] [N/A:N/A] Comorbid History: [3:Hypertension, Peripheral Venous Disease, Osteoarthritis 12/13/2020] [N/A:N/A] Date Acquired: [3:30] [N/A:N/A] Weeks of Treatment: [3:Open] [N/A:N/A] Wound Status: [3:No] [N/A:N/A] Wound Recurrence: [3:0.9x1.2x0.1] [N/A:N/A] Measurements L x W x D (cm) [3:0.848] [N/A:N/A] A (cm) : rea [3:0.085] [N/A:N/A] Volume (cm) : [3:97.10%] [N/A:N/A] % Reduction in A [3:rea: 98.50%] [N/A:N/A] % Reduction in Volume: [3:Full Thickness Without Exposed] [N/A:N/A] Classification: [3:Support Structures Medium] [N/A:N/A] Exudate A mount: [3:Serosanguineous] [N/A:N/A]  Exudate Type: [3:red, [name]] [N/A:N/A] Exudate Color: [3:Distinct, outline attached] [N/A:N/A] Wound Margin: [3:Small (1-33%)] [N/A:N/A] Granulation A mount: [3:Pink] [N/A:N/A] Granulation Quality: [3:Large (67-100%)] [N/A:N/A] Necrotic A mount: [3:Fascia: No] [N/A:N/A] Exposed Structures: [3:Fat Layer (Subcutaneous Tissue): No Tendon: No Muscle: No Joint: No Bone: No Medium (34-66%)] [N/A:N/A] Epithelialization: [3:Debridement - Selective/Open Wound] [N/A:N/A] Debridement: Pre-procedure Verification/Time Out 15:07 [N/A:N/A] Taken: [3:Lidocaine 5% topical ointment] [N/A:N/A] Pain Control: [3:Slough] [N/A:N/A] Tissue Debrided: [3:Non-Viable Tissue] [N/A:N/A] Level: [3:1.08] [N/A:N/A] Debridement A (sq cm): [3:rea Curette] [N/A:N/A] Instrument: [3:Minimum] [N/A:N/A] Bleeding: [3:Pressure] [N/A:N/A] Hemostasis A chieved: [3:0] [N/A:N/A] Procedural Pain: [3:0] [N/A:N/A] Post Procedural Pain: [3:Procedure was tolerated well] [N/A:N/A] Debridement Treatment Response: [3:0.9x1.2x0.1]  [N/A:N/A] Post Debridement Measurements L x W x D (cm) [3:0.085] [N/A:N/A] Post Debridement Volume: (cm) [3:Debridement] [N/A:N/A] Treatment Notes Electronic Signature(s) Signed: 09/22/2021 3:27:06 PM By: [name] MD FACS Signed: 09/22/2021 4:33:41 PM By: [name] RN Entered By: [name] on 09/22/2021 15:27:06 -------------------------------------------------------------------------------- Multi-Disciplinary Care Plan Details Patient Name: Date of Service: Erin Face D. 09/22/2021 2:30 PM Medical Record Number: [id number] Patient Account Number: [id number] Date of Birth/Sex: Treating RN: 06/10/1955 (66 y.o. [name] Primary Care Roniyah Llorens: [name] Other Clinician: Referring Jahleah Mariscal: Treating Nicolaas Savo/Extender: [name] in Treatment: 87 Multidisciplinary Care Plan reviewed with physician Active Inactive Venous Leg Ulcer Nursing Diagnoses: Actual venous Insuffiency (use after diagnosis is confirmed) Goals: Patient will maintain optimal edema control Date Initiated: 02/21/2021 Target Resolution Date: 10/12/2021 Goal Status: Active Patient/caregiver will verbalize understanding of disease process and disease management Date Initiated: 02/21/2021 Date Inactivated: 03/29/2021 Target Resolution Date: 03/24/2021 Goal Status: Met Interventions: Assess peripheral edema status every visit. Compression as ordered Provide education on venous insufficiency Notes: Wound/Skin Impairment Nursing Diagnoses: Impaired tissue integrity Knowledge deficit related to ulceration/compromised skin integrity Goals: Patient/caregiver will verbalize understanding of skin care regimen Date Initiated: 02/21/2021 Target Resolution Date: 10/12/2021 Goal Status: Active Ulcer/skin breakdown will have a volume reduction of 30% by week 4 Date Initiated: 02/21/2021 Date Inactivated: 03/29/2021 Target Resolution Date: 03/24/2021 Goal  Status: Unmet Unmet Reason: infection Ulcer/skin breakdown will have a volume reduction of 50% by week 8 Date Initiated: 03/29/2021 Date Inactivated: 04/19/2021 Target Resolution Date: 04/21/2021 Goal Status: Met Interventions: Assess patient/caregiver ability to obtain necessary supplies Assess patient/caregiver ability to perform ulcer/skin care regimen upon admission and as needed Assess ulceration(s) every visit Provide education on ulcer and skin care Notes: Electronic Signature(s) Signed: 09/22/2021 4:07:48 PM By: Adline Peals Entered By: Adline Peals on 09/22/2021 15:01:08 -------------------------------------------------------------------------------- Pain Assessment Details Patient Name: Date of Service: LADELLE, TEODORO D. 09/22/2021 2:30 PM Medical Record Number: [id number] Patient Account Number: [id number] Date of Birth/Sex: Treating RN: May 15, 1955 (66 y.o. [name] Primary Care Ewald Beg: [name] Other Clinician: Referring Kaison Mcparland: Treating Lenyx Boody/Extender: [name] in Treatment: 30 Active Problems Location of Pain Severity and Description of Pain Patient Has Paino No Site Locations Rate the pain. Rate the pain. Current Pain Level: 0 Pain Management and Medication Current Pain Management: Electronic Signature(s) Signed: 09/22/2021 4:07:48 PM By: Adline Peals Entered By: Adline Peals on 09/22/2021 15:00:33 -------------------------------------------------------------------------------- Patient/Caregiver Education Details Patient Name: Date of Service: [name] 8/11/2023andnbsp2:30 PM Medical Record Number: [id number] Patient Account Number: [id number] Date of Birth/Gender: Treating RN: Aug 27, 1955 (66 y.o. [name] Primary Care Physician: [name] Other Clinician: Referring Physician: Treating Physician/Extender: [name] in  Treatment: 30 Education Assessment Education Provided To: Patient Education Topics Provided Wound/Skin Impairment: Methods: Explain/Verbal Responses:  Reinforcements needed, State content correctly Electronic Signature(s) Signed: 09/22/2021 4:07:48 PM By: Adline Peals Entered By: Adline Peals on 09/22/2021 15:01:19 -------------------------------------------------------------------------------- Wound Assessment Details Patient Name: Date of Service: JASMINNE, MEALY D. 09/22/2021 2:30 PM Medical Record Number: [id number] Patient Account Number: [id number] Date of Birth/Sex: Treating RN: 1955-05-24 (66 y.o. [name] Primary Care Pax Reasoner: [name] Other Clinician: Referring Mersades Barbaro: Treating Itai Barbian/Extender: [name] in Treatment: 30 Wound Status Wound Number: 3 Primary Venous Leg Ulcer Etiology: Wound Location: Left, Lateral Lower Leg Wound Open Wounding Event: Blister Status: Date Acquired: 12/13/2020 Comorbid Cataracts, Lymphedema, Hypertension, Peripheral Venous Weeks Of Treatment: 30 History: Disease, Osteoarthritis Clustered Wound: No Photos Wound Measurements Length: (cm) 0.9 Width: (cm) 1.2 Depth: (cm) 0.1 Area: (cm) 0.848 Volume: (cm) 0.085 % Reduction in Area: 97.1% % Reduction in Volume: 98.5% Epithelialization: Medium (34-66%) Wound Description Classification: Full Thickness Without Exposed Support Structures Wound Margin: Distinct, outline attached Exudate Amount: Medium Exudate Type: Serosanguineous Exudate Color: red, [name] Foul Odor After Cleansing: No Slough/Fibrino Yes Wound Bed Granulation Amount: Small (1-33%) Exposed Structure Granulation Quality: Pink Fascia Exposed: No Necrotic Amount: Large (67-100%) Fat Layer (Subcutaneous Tissue) Exposed: No Necrotic Quality: Adherent Slough Tendon Exposed: No Muscle Exposed: No Joint Exposed: No Bone Exposed: No Treatment Notes Wound  #3 (Lower Leg) Wound Laterality: Left, Lateral Cleanser Soap and Water Discharge Instruction: May shower and wash wound with dial antibacterial soap and water prior to dressing change. Wound Cleanser Discharge Instruction: Cleanse the wound with wound cleanser prior to applying a clean dressing using gauze sponges, not tissue or cotton balls. Peri-Wound Care Sween Lotion (Moisturizing lotion) Discharge Instruction: Apply moisturizing lotion as directed Topical Primary Dressing Promogran Prisma Matrix, 4.34 (sq in) (silver collagen) Discharge Instruction: Moisten collagen with saline or hydrogel Secondary Dressing Zetuvit Plus 4x8 in Discharge Instruction: Apply over primary dressing as directed. Secured With Compression Wrap CoFlex TLC XL 2-layer Compression System 4x7 (in/yd) Discharge Instruction: Apply CoFlex 2-layer compression as directed. (alt for 4 layer) Compression Stockings Add-Ons Electronic Signature(s) Signed: 09/22/2021 4:33:41 PM By: [name] RN Signed: 09/22/2021 5:06:10 PM By: Deon Pilling RN, BSN Entered By: Deon Pilling on 09/22/2021 14:57:36 -------------------------------------------------------------------------------- Vitals Details Patient Name: Date of Service: [name] 09/22/2021 2:30 PM Medical Record Number: [id number] Patient Account Number: [id number] Date of Birth/Sex: Treating RN: Nov 04, 1955 (66 y.o. [name] Primary Care Jillian Warth: [name] Other Clinician: Referring Edras Wilford: Treating Cheveyo Virginia/Extender: [name] in Treatment: 30 Vital Signs Time Taken: 14:47 Temperature (F): 97.8 Height (in): 62 Pulse (bpm): 81 Weight (lbs): 226 Respiratory Rate (breaths/min): 18 Body Mass Index (BMI): 41.3 Blood Pressure (mmHg): 126/78 Reference Range: 80 - 120 mg / dl Electronic Signature(s) Signed: 09/27/2021 4:40:43 PM By: Donavan Burnet CHT EMT BS , , Entered By: Donavan Burnet on 09/22/2021 14:47:59

## 2021-09-29 ENCOUNTER — Other Ambulatory Visit (HOSPITAL_COMMUNITY): Payer: Self-pay

## 2021-09-29 ENCOUNTER — Encounter (HOSPITAL_BASED_OUTPATIENT_CLINIC_OR_DEPARTMENT_OTHER): Payer: 59 | Admitting: General Surgery

## 2021-09-29 DIAGNOSIS — E669 Obesity, unspecified: Secondary | ICD-10-CM | POA: Diagnosis not present

## 2021-09-29 DIAGNOSIS — L97828 Non-pressure chronic ulcer of other part of left lower leg with other specified severity: Secondary | ICD-10-CM | POA: Diagnosis not present

## 2021-09-29 DIAGNOSIS — Z6841 Body Mass Index (BMI) 40.0 and over, adult: Secondary | ICD-10-CM | POA: Diagnosis not present

## 2021-09-29 DIAGNOSIS — L97822 Non-pressure chronic ulcer of other part of left lower leg with fat layer exposed: Secondary | ICD-10-CM | POA: Diagnosis not present

## 2021-09-29 DIAGNOSIS — I10 Essential (primary) hypertension: Secondary | ICD-10-CM | POA: Diagnosis not present

## 2021-09-29 DIAGNOSIS — L817 Pigmented purpuric dermatosis: Secondary | ICD-10-CM | POA: Diagnosis not present

## 2021-09-29 DIAGNOSIS — I739 Peripheral vascular disease, unspecified: Secondary | ICD-10-CM | POA: Diagnosis not present

## 2021-09-29 DIAGNOSIS — I87332 Chronic venous hypertension (idiopathic) with ulcer and inflammation of left lower extremity: Secondary | ICD-10-CM | POA: Diagnosis not present

## 2021-09-29 DIAGNOSIS — I872 Venous insufficiency (chronic) (peripheral): Secondary | ICD-10-CM | POA: Diagnosis not present

## 2021-09-29 DIAGNOSIS — M1990 Unspecified osteoarthritis, unspecified site: Secondary | ICD-10-CM | POA: Diagnosis not present

## 2021-09-29 MED ORDER — SIMVASTATIN 20 MG PO TABS
20.0000 mg | ORAL_TABLET | Freq: Every day | ORAL | 0 refills | Status: DC
  Filled 2021-09-29: qty 32, 32d supply, fill #0
  Filled 2021-09-29: qty 58, 58d supply, fill #0

## 2021-09-29 NOTE — Progress Notes (Signed)
[name] ([id number]) Visit Report for 09/29/2021 Chief Complaint Document Details Patient Name: Date of Service: [name], [name] 09/29/2021 1:15 PM Medical Record Number: [id number] Patient Account Number: [id number] Date of Birth/Sex: Treating RN: 1956/01/13 (66 y.o. [name] Primary Care Provider: [name] Other Clinician: Referring Provider: Treating Provider/Extender: [name] in Treatment: 31 Information Obtained from: Patient Chief Complaint 04/19/2021: The patient is here for ongoing follow-up regarding 2 left lower extremity wounds. Electronic Signature(s) Signed: 09/29/2021 1:51:58 PM By: [name] MD FACS Entered By: [name] on 09/29/2021 13:51:58 -------------------------------------------------------------------------------- Debridement Details Patient Name: Date of Service: [name] [name]. 09/29/2021 1:15 PM Medical Record Number: [id number] Patient Account Number: [id number] Date of Birth/Sex: Treating RN: [birth year] (66 y.o. [name] Primary Care Provider: [name] Other Clinician: Referring Provider: Treating Provider/Extender: [name] in Treatment: 31 Debridement Performed for Assessment: Wound #3 Left,Lateral Lower Leg Performed By: Physician [name], MD Debridement Type: Debridement Severity of Tissue Pre Debridement: Fat layer exposed Level of Consciousness (Pre-procedure): Awake and Alert Pre-procedure Verification/Time Out Yes - 13:36 Taken: Start Time: 13:36 Pain Control: Lidocaine 5% topical ointment T Area Debrided (L x W): otal 0.8 (cm) x 0.9 (cm) = 0.72 (cm) Tissue and other material debrided: Non-Viable, Slough, Slough Level: Non-Viable Tissue Debridement Description: Selective/Open Wound Instrument: Curette Bleeding: Minimum Hemostasis Achieved: Pressure End Time: 13:37 Procedural Pain: 0 Post Procedural Pain:  0 Response to Treatment: Procedure was tolerated well Level of Consciousness (Post- Awake and Alert procedure): Post Debridement Measurements of Total Wound Length: (cm) 0.8 Width: (cm) 0.9 Depth: (cm) 0.1 Volume: (cm) 0.057 Character of Wound/Ulcer Post Debridement: Improved Severity of Tissue Post Debridement: Fat layer exposed Post Procedure Diagnosis Same as Pre-procedure Electronic Signature(s) Signed: 09/29/2021 4:05:21 PM By: [name] MD FACS Signed: 09/29/2021 5:30:22 PM By: [name] RN Entered By: [name] on 09/29/2021 13:41:16 -------------------------------------------------------------------------------- HPI Details Patient Name: Date of Service: [name] [name]. 09/29/2021 1:15 PM Medical Record Number: [id number] Patient Account Number: [id number] Date of Birth/Sex: Treating RN: [birth year] (66 y.o. 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 She tells [name] that she fell on the gravel driveway in July. She developed injuries on her distal lower leg which have not healed. She saw her primary physician on 11/15/2017 who noted her left shin injuries. Gave her antibiotics. At that point the wounds were almost circumferential however most were less than 1.5 cm. Weeping edema fluid was noted. She was referred here for evaluation. The patient has a history of chronic lower extremity edema. 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 She has had prior history with leg weeping fluid. She does not wear compression stockings. She is not doing anything specific to these wound  areas. 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 She also has lymphedema. Put her in 3 layer compression and silver alginate last week. She has 3 small wounds with  depth just lateral to the tibia. More problematically than this she has numerous shallow areas some of which are almost canal like in shape with tightly adherent painful debris. It would be very difficult and time- consuming to go through this and attempt to individually debride all these areas.. I changed her to collagen today to see if that would help with any of the surface debris on some of these wounds. Otherwise we will not be able to put this in compression we have to have someone change the dressing. The patient is not eligible for home health 12/25/17 on evaluation today patient actually appears to be doing rather well in regard to the ulcer on her lower extremity. Fortunately there does not appear to be evidence of infection at this time. She has been tolerating the dressing changes without complication. This includes the compression wrap. The only issue she had was that the wrap was initially placed over her bunion region which actually calls her some discomfort and pain. Other than that things seem to be going rather well. 01/01/2018 Seen today for follow-up and management of left lower extremity wound and lymphedema. T oday she presents with a new wound towards to the left lateral LE. Recently treated with a 7 day course of amoxicillin; reason for antibiotic dose is unknown at this time. Tolerating current treatment of collagen with 4- layer wraps. She obtained a venous reflux study on 12/25/17. Studies show on the right abnormal reflux times of the  popliteal vein, great saphenous vein at the saphenofemoral junction at the proximal thigh, great saphenous vein at the mid calf, and origin of the small saphenous vein.No superficial thrombosis. No deep vein thrombosis in the common femoral, femoral,and popliteal veins. Left abnormal reflex times as well of the common femoral vein, popliteal vein, and a great saphenous vein at the saphenofemoral junction, In great saphenous vein at the mid thigh w/o thrombosis. Has any issues or concerns during visit today. Recommended follow-up to vascular specialist due to abnormalities from the venous reflux study. Denies fever, pain, chills, dizziness, nausea, or vomiting. 01/08/18 upon evaluation today the patient actually seems to be showing some signs of improvement in my opinion at this point in regard to the lower extremity ulcerated areas. She still has a lot of drainage but fortunately nothing that appears to be too significant currently. I have been very happy with the overall progress I see today compared to where things were during the last evaluation that I had with her. Nonetheless she has not had her appointment with the vein specialist as of yet in fact we were able to get this approved for her today and confirmed with them she will be seeing them on December 26. Nonetheless in general I do feel like the compression wraps is doing well for her. 01/17/18; quite a bit of improvement since last time I saw this patient she has a small open area remaining on the left lateral calf and even smaller area medially. She has lymphedema chronic stasis changes with distal skin fibrosis. She has an appointment with vascular surgery later this month 01/24/2018; the patient's medial leg has closed. Still a small open area on the left lateral leg. She states that the 4 layer compression we put on last week was too tight and she had to take it off over a few days ago. We have had resultant increase in her lymphedema in  the dorsal foot and a proximal calf. Fortunately that does not seem to have resulted  in any deterioration in her wounds The patient is going to need compression stockings. We have given her measurements to phone elastic therapy in [place]. She has vascular surgery consult on December 26 02/07/18; she's had continuous contraction on the left lateral leg wound which is now very small. Currently using silver alginate under 3 layer compression She saw Dr. Trula Slade of vascular surgery on 02/06/18. It was noted that she had a normal reflux times in the popliteal vein, great saphenous vein at the saphenofemoral junction, great saphenous vein at the proximal thigh great saphenous vein at the mid calf and origin of the small saphenous vein. It was noted that she had significant reflux in the left saphenous veins with diameter measurements in the 0.7-0.8 cm range. It was felt she would benefit from laser ablation to help minimize the risk of ulcer recurrence. It was recommended that she wear 20-30 thigh-high compression stockings and have follow-up in 4-6 weeks 02/17/2018; I thought this lady would be healed however her compression slipped down and she developed increasing swelling and the wound is actually larger. 1/13; we had deterioration last week after the patient's compression slipped down and she developed periwound swelling. I increased her compression before layers we have been using silver alginate we are a lot better again today. She has her compression stockings in waiting 1/23; the patient's wounds are totally healed today. She has her stockings. This was almost circumferential skin damage. She follows up with Dr. Trula Slade of vascular surgery next Monday. READMISSION 02/21/2021 This is a now 66 year old woman that we had in clinic here discharging in January 2020 with wounds on her left calf chronic venous insufficiency. She was discharged with 30/40 stockings. It does not sound like she has worn  stockings in about a year largely from not being able to get them on herself. In November she developed new blisters on her legs an area laterally is opened into a fairly sizable wound. She has weeping posteriorly as well. She has been using Neosporin and Band-Aids. The patient did see Dr. Trula Slade in 2019 and 2020. He felt she might benefit from laser ablation in her left saphenous veins although because of the wound that healed I do not think he went through with it. She might benefit from seeing him again. Her ABI on the left is 1. 1/17; patient's wound on the posterior left calf is closed she has the 2 large areas last week. We put her in 4-layer compression for edema control is a lot better. Our intake nurse noted greenish drainage and odor. We have been using silver alginate 1/25; PCR culture I did have the substantial wound area on the left lateral lower leg showed staff aureus and group A strep. Low titers of coag negative staph which are probably skin contaminants. Resistance detected to tetracycline methicillin and macrolides. I gave her a starter kit of Nuzyra 150 mg x 3 for 2 days then 300 mg for a further 7 days. Marked odor considerable increase in surrounding erythema. We are using Iodoflex last week however I have changed to silver alginate with underlying Bactroban 2/1; she is completing her [place] tomorrow. The degree of erythema around the wounds looks a lot better. Odor has improved. I gave her silver alginate and Bactroban last week and changing her back to Iodoflex to continue with ongoing debridement 2/8; the periwound looks a lot better. Surface of the wound also looks somewhat better although there is still ongoing debridement to be done we have been  using Iodoflex under compression. Primary dressing and silver alginate 2/15; left posterior calf. Some improvement in the surface of the wound but still very gritty we have been using Iodoflex under compression. 04/05/2021: Left  lateral posterior calf. She continues to have significant amounts of drainage, some of which is probably comprised of the Iodoflex microbleeds. There is no odor to the drainage. The satellite lesion on the more posterior aspect of the calf is epithelializing nicely and has contracted quite a bit. There is robust granulation tissue in the dominant wound with minimal adherent slough. 04/12/2021: The satellite lesion has nearly closed. She continues to have good granulation tissue with minimal slough of the larger primary wound. She continues to have a fair amount of drainage, but I think this is less secondary to switching her dressing from Iodoflex to Prisma. 04/19/2021: The satellite lesion is almost completely epithelialized. The larger primary wound continues to contract with good granulation tissue and minimal slough. She is currently in [place] with compression. 04/26/2021: The satellite lesion has closed. The larger primary wound has contracted further and the granulation tissue is robust without being hypertrophic. Minimal slough is present. 05/03/2021: The satellite lesion remains closed. The larger primary wound has a bit of slough, but has also contracted further with good perimeter epithelialization. Good granulation tissue at the wound surface. There was some greenish drainage on the dressing when it was removed; it is not the blue-green typically associated with Pseudomonas aeruginosa, however. 05/10/2021: The primary wound continues to contract. There is minimal slough. The granulation tissue at 9:00 is a bit hypertrophic. No significant drainage. No odor. 05/17/2021: The wound is a little bit smaller today with good granulation tissue. Minimal slough. No significant drainage or odor. 05/24/2021: The wound continues to contract and has a nice base of granulation tissue. Small amount of slough. No concern for infection. 05/31/2021: For some reason, the wound measured slightly larger today but overall  it still appears to be in good condition with a nice base of granulation tissue and minimal slough. 06/07/2021: The wound is smaller today. Good granulation tissue and minimal slough. 06/14/2021: The wound is unchanged in size. She continues to accumulate some slough. Good granulation tissue on the surface. 06/20/2021: The wound is smaller today. Minimal slough with good granulation tissue. 06/27/2021: The wound on her left lateral leg is smaller today with just a bit of slough and eschar accumulation. Unfortunately, she has opened a new superficial wound on her right lower extremity. She has 3+ pitting edema to the knees on that leg and she does not wear compression stockings. 07/04/2021: In addition to the superficial wound on her right lower extremity that she opened up last week, she has opened 3 additional sites on the same leg. Her compression wraps were clearly not in correct position when she came to clinic today as they were at about the mid calf level rather than to the tibial tuberosity. She says that they slipped earlier and she had to come back on Friday to have them redone. The wound on her left lateral leg is perhaps slightly larger with some slough accumulation. 07/11/2021: The superficial wounds on her right lower extremity are nearly closed. They just have a thin layer of eschar overlying them. The wound on her left lateral leg is a little bit shallower and has a bit of slough accumulation. 07/17/2021: The right medial lower extremity leg wounds have almost completely closed; one of them just has a tiny opening with a little bit of  serous drainage. The left lateral leg wound is really unchanged. It seems to be stalled. 07/24/2021: The right leg wounds are completely closed. The left lateral leg wound is actually bigger today. It is a bit more tender. There is a minor accumulation of slough on the surface. 07/31/2021: The culture that I took last week was positive for MRSA. We added mupirocin  under the silver alginate and I prescribed doxycycline. She has been tolerating this well. The wound is smaller today but does have slough accumulation. No significant pain or drainage. 08/07/2021: She completed her course of doxycycline. The wound looks much better today. It is smaller and the periwound is less inflamed. She does have some slough accumulation on the surface. 08/14/2021: The wound continues to contract. There is slough on the wound surface, but the periwound is intact without inflammation or induration. 08/21/2021: The wound is down to just 2 small open sites. They both have a bit of slough accumulation. 08/28/2021: The wound is down to just 1 small open site with a little bit of slough and eschar accumulation. 09/04/2021: The wound continues to contract but remains open. It is clean without any slough. 09/12/2021: No real change in the overall wound dimensions, but it is flush with the surrounding skin. There is a little bit of slough accumulation. Edema control is good. 09/22/2021: The wound is smaller and even more superficial. Minimal slough accumulation. Good control of edema. 09/29/2021: The wound continues to contract. She reports that it was a little bit "twingey" during the week. It is a little bit dry on inspection today. Light accumulation of slough. Good edema control. Electronic Signature(s) Signed: 09/29/2021 1:52:51 PM By: [name] MD FACS Entered By: [name] on 09/29/2021 13:52:51 -------------------------------------------------------------------------------- Physical Exam Details Patient Name: Date of Service: [name] [name]. 09/29/2021 1:15 PM Medical Record Number: [id number] Patient Account Number: [id number] Date of Birth/Sex: Treating RN: 11-26-1955 (66 y.o. [name] Primary Care Provider: [name] Other Clinician: Referring Provider: Treating Provider/Extender: [name] in Treatment:  31 Constitutional . . . . No acute distress.Marland Kitchen Respiratory Normal work of breathing on room air.. Notes 09/29/2021: The wound continues to contract. She reports that it was a little bit "twingey" during the week. It is a little bit dry on inspection today. Light accumulation of slough. Good edema control. Electronic Signature(s) Signed: 09/29/2021 1:53:27 PM By: [name] MD FACS Entered By: [name] on 09/29/2021 13:53:27 -------------------------------------------------------------------------------- Physician Orders Details Patient Name: Date of Service: [name] [name]. 09/29/2021 1:15 PM Medical Record Number: [id number] Patient Account Number: [id number] Date of Birth/Sex: Treating RN: 02/03/1956 (65 y.o. [name] Primary Care Provider: [name] Other Clinician: Referring Provider: Treating Provider/Extender: [name] in Treatment: 72 Verbal / Phone Orders: No Diagnosis Coding ICD-10 Coding Code Description 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 (idiopathic) with ulcer and inflammation of left lower extremity L97.828 Non-pressure chronic ulcer of other part of left lower leg with other specified severity Follow-up Appointments ppointment in 1 week. - Dr. Celine Ahr Room 3 - Friday August 25th at 1:15pm Return A Bathing/ Shower/ Hygiene May shower with protection but do not get wound dressing(s) wet. - Ok to use [employer], can purchase at [facility], [facility], or [employer] Edema Control - Lymphedema / SCD / Other Elevate legs to the level of the heart or above for 30 minutes daily and/or when sitting, a frequency of: - throughout the day Avoid standing for long periods of time. Exercise  regularly Compression stocking or Garment 20-30 mm/Hg pressure to: - Brochure for Elastic Therapy. Leg measurements included Wound Treatment Wound #3 - Lower Leg Wound Laterality: Left, Lateral Cleanser: Soap and Water 1 x Per Week/30  Days Discharge Instructions: May shower and wash wound with dial antibacterial soap and water prior to dressing change. Cleanser: Wound Cleanser 1 x Per Week/30 Days Discharge Instructions: Cleanse the wound with wound cleanser prior to applying a clean dressing using gauze sponges, not tissue or cotton balls. Peri-Wound Care: Sween Lotion (Moisturizing lotion) 1 x Per Week/30 Days Discharge Instructions: Apply moisturizing lotion as directed Prim Dressing: Promogran Prisma Matrix, 4.34 (sq in) (silver collagen) 1 x Per Week/30 Days ary Discharge Instructions: Moisten collagen with saline or hydrogel Secondary Dressing: Zetuvit Plus 4x8 in 1 x Per Week/30 Days Discharge Instructions: Apply over primary dressing as directed. Compression Wrap: CoFlex TLC XL 2-layer Compression System 4x7 (in/yd) 1 x Per Week/30 Days Discharge Instructions: Apply CoFlex 2-layer compression as directed. (alt for 4 layer) Electronic Signature(s) Signed: 09/29/2021 4:05:21 PM By: [name] MD FACS Entered By: [name] on 09/29/2021 13:55:40 -------------------------------------------------------------------------------- Problem List Details Patient Name: Date of Service: [name] [name]. 09/29/2021 1:15 PM Medical Record Number: [id number] Patient Account Number: [id number] Date of Birth/Sex: Treating RN: [birth year] (66 y.o. [name] Primary Care Provider: [name] Other Clinician: Referring Provider: Treating Provider/Extender: [name] in Treatment: 31 Active Problems ICD-10 Encounter Code Description Active Date MDM Diagnosis 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 (idiopathic) with ulcer and inflammation of left 02/21/2021 No Yes lower extremity L97.828 Non-pressure chronic ulcer of other part of left lower leg with other specified 02/21/2021 No Yes severity Inactive Problems ICD-10 Code Description Active Date Inactive Date L03.116  Cellulitis of left lower limb 03/08/2021 03/08/2021 L97.811 Non-pressure chronic ulcer of other part of right lower leg limited to breakdown of skin 06/27/2021 06/27/2021 Resolved Problems Electronic Signature(s) Signed: 09/29/2021 1:51:40 PM By: [name] MD FACS Entered By: [name] on 09/29/2021 13:51:39 -------------------------------------------------------------------------------- Progress Note Details Patient Name: Date of Service: [name] [name]. 09/29/2021 1:15 PM Medical Record Number: [id number] Patient Account Number: [id number] Date of Birth/Sex: Treating RN: 06/23/1955 (66 y.o. [name] Primary Care Provider: [name] Other Clinician: Referring Provider: Treating Provider/Extender: [name] in Treatment: 31 Subjective Chief Complaint Information obtained from Patient 04/19/2021: The patient is here for ongoing follow-up regarding 2 left lower extremity wounds. 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 She tells [name] that she fell on the gravel driveway in July. She developed injuries on her distal lower leg which have not healed. She saw her primary physician on 11/15/2017 who noted her left shin injuries. Gave her antibiotics. At that point the wounds were almost circumferential however most were less than 1.5 cm. Weeping edema fluid was noted. She was referred here for evaluation. The patient has a history of chronic lower extremity edema. 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 She has had prior history with leg weeping fluid. She does not wear compression stockings. She is not doing anything specific to these wound areas. The patient has a history of obesity, arthritis, peripheral vascular disease hypertension lower  extremity edema and Schamberg's disease ABI in our clinic was 1.3 on the left 12/17/2017; patient readmitted to the clinic  last week. 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 She also has lymphedema. Put her in 3 layer compression and silver alginate last week. She has 3 small wounds with depth just lateral to the tibia. More problematically than this she has numerous shallow areas some of which are almost canal like in shape with tightly adherent painful debris. It would be very difficult and time- consuming to go through this and attempt to individually debride all these areas.. I changed her to collagen today to see if that would help with any of the surface debris on some of these wounds. Otherwise we will not be able to put this in compression we have to have someone change the dressing. The patient is not eligible for home health 12/25/17 on evaluation today patient actually appears to be doing rather well in regard to the ulcer on her lower extremity. Fortunately there does not appear to be evidence of infection at this time. She has been tolerating the dressing changes without complication. This includes the compression wrap. The only issue she had was that the wrap was initially placed over her bunion region which actually calls her some discomfort and pain. Other than that things seem to be going rather well. 01/01/2018 Seen today for follow-up and management of left lower extremity wound and lymphedema. T oday she presents with a new wound towards to the left lateral LE. Recently treated with a 7 day course of amoxicillin; reason for antibiotic dose is unknown at this time. Tolerating current treatment of collagen with 4- layer wraps. She obtained a venous reflux study on 12/25/17. Studies show on the right abnormal reflux times of the popliteal vein, great saphenous vein at the saphenofemoral junction at the proximal thigh, great  saphenous vein at the mid calf, and origin of the small saphenous vein.No superficial thrombosis. No deep vein thrombosis in the common femoral, femoral,and popliteal veins. Left abnormal reflex times as well of the common femoral vein, popliteal vein, and a great saphenous vein at the saphenofemoral junction, In great saphenous vein at the mid thigh w/o thrombosis. Has any issues or concerns during visit today. Recommended follow-up to vascular specialist due to abnormalities from the venous reflux study. Denies fever, pain, chills, dizziness, nausea, or vomiting. 01/08/18 upon evaluation today the patient actually seems to be showing some signs of improvement in my opinion at this point in regard to the lower extremity ulcerated areas. She still has a lot of drainage but fortunately nothing that appears to be too significant currently. I have been very happy with the overall progress I see today compared to where things were during the last evaluation that I had with her. Nonetheless she has not had her appointment with the vein specialist as of yet in fact we were able to get this approved for her today and confirmed with them she will be seeing them on December 26. Nonetheless in general I do feel like the compression wraps is doing well for her. 01/17/18; quite a bit of improvement since last time I saw this patient she has a small open area remaining on the left lateral calf and even smaller area medially. She has lymphedema chronic stasis changes with distal skin fibrosis. She has an appointment with vascular surgery later this month 01/24/2018; the patient's medial leg has closed. Still a small open area on the left lateral leg. She states that the 4 layer compression we put on last week was too tight  and she had to take it off over a few days ago. We have had resultant increase in her lymphedema in the dorsal foot and a proximal calf. Fortunately that does not seem to have resulted in any  deterioration in her wounds The patient is going to need compression stockings. We have given her measurements to phone elastic therapy in [place]. She has vascular surgery consult on December 26 02/07/18; she's had continuous contraction on the left lateral leg wound which is now very small. Currently using silver alginate under 3 layer compression She saw Dr. Trula Slade of vascular surgery on 02/06/18. It was noted that she had a normal reflux times in the popliteal vein, great saphenous vein at the saphenofemoral junction, great saphenous vein at the proximal thigh great saphenous vein at the mid calf and origin of the small saphenous vein. It was noted that she had significant reflux in the left saphenous veins with diameter measurements in the 0.7-0.8 cm range. It was felt she would benefit from laser ablation to help minimize the risk of ulcer recurrence. It was recommended that she wear 20-30 thigh-high compression stockings and have follow-up in 4-6 weeks 02/17/2018; I thought this lady would be healed however her compression slipped down and she developed increasing swelling and the wound is actually larger. 1/13; we had deterioration last week after the patient's compression slipped down and she developed periwound swelling. I increased her compression before layers we have been using silver alginate we are a lot better again today. She has her compression stockings in waiting 1/23; the patient's wounds are totally healed today. She has her stockings. This was almost circumferential skin damage. She follows up with Dr. Trula Slade of vascular surgery next Monday. READMISSION 02/21/2021 This is a now 66 year old woman that we had in clinic here discharging in January 2020 with wounds on her left calf chronic venous insufficiency. She was discharged with 30/40 stockings. It does not sound like she has worn stockings in about a year largely from not being able to get them on herself. In November she  developed new blisters on her legs an area laterally is opened into a fairly sizable wound. She has weeping posteriorly as well. She has been using Neosporin and Band-Aids. The patient did see Dr. Trula Slade in 2019 and 2020. He felt she might benefit from laser ablation in her left saphenous veins although because of the wound that healed I do not think he went through with it. She might benefit from seeing him again. Her ABI on the left is 1. 1/17; patient's wound on the posterior left calf is closed she has the 2 large areas last week. We put her in 4-layer compression for edema control is a lot better. Our intake nurse noted greenish drainage and odor. We have been using silver alginate 1/25; PCR culture I did have the substantial wound area on the left lateral lower leg showed staff aureus and group A strep. Low titers of coag negative staph which are probably skin contaminants. Resistance detected to tetracycline methicillin and macrolides. I gave her a starter kit of Nuzyra 150 mg x 3 for 2 days then 300 mg for a further 7 days. Marked odor considerable increase in surrounding erythema. We are using Iodoflex last week however I have changed to silver alginate with underlying Bactroban 2/1; she is completing her [place] tomorrow. The degree of erythema around the wounds looks a lot better. Odor has improved. I gave her silver alginate and Bactroban last week and  changing her back to Iodoflex to continue with ongoing debridement 2/8; the periwound looks a lot better. Surface of the wound also looks somewhat better although there is still ongoing debridement to be done we have been using Iodoflex under compression. Primary dressing and silver alginate 2/15; left posterior calf. Some improvement in the surface of the wound but still very gritty we have been using Iodoflex under compression. 04/05/2021: Left lateral posterior calf. She continues to have significant amounts of drainage, some of which is  probably comprised of the Iodoflex microbleeds. There is no odor to the drainage. The satellite lesion on the more posterior aspect of the calf is epithelializing nicely and has contracted quite a bit. There is robust granulation tissue in the dominant wound with minimal adherent slough. 04/12/2021: The satellite lesion has nearly closed. She continues to have good granulation tissue with minimal slough of the larger primary wound. She continues to have a fair amount of drainage, but I think this is less secondary to switching her dressing from Iodoflex to Prisma. 04/19/2021: The satellite lesion is almost completely epithelialized. The larger primary wound continues to contract with good granulation tissue and minimal slough. She is currently in [place] with compression. 04/26/2021: The satellite lesion has closed. The larger primary wound has contracted further and the granulation tissue is robust without being hypertrophic. Minimal slough is present. 05/03/2021: The satellite lesion remains closed. The larger primary wound has a bit of slough, but has also contracted further with good perimeter epithelialization. Good granulation tissue at the wound surface. There was some greenish drainage on the dressing when it was removed; it is not the blue-green typically associated with Pseudomonas aeruginosa, however. 05/10/2021: The primary wound continues to contract. There is minimal slough. The granulation tissue at 9:00 is a bit hypertrophic. No significant drainage. No odor. 05/17/2021: The wound is a little bit smaller today with good granulation tissue. Minimal slough. No significant drainage or odor. 05/24/2021: The wound continues to contract and has a nice base of granulation tissue. Small amount of slough. No concern for infection. 05/31/2021: For some reason, the wound measured slightly larger today but overall it still appears to be in good condition with a nice base of granulation tissue and minimal  slough. 06/07/2021: The wound is smaller today. Good granulation tissue and minimal slough. 06/14/2021: The wound is unchanged in size. She continues to accumulate some slough. Good granulation tissue on the surface. 06/20/2021: The wound is smaller today. Minimal slough with good granulation tissue. 06/27/2021: The wound on her left lateral leg is smaller today with just a bit of slough and eschar accumulation. Unfortunately, she has opened a new superficial wound on her right lower extremity. She has 3+ pitting edema to the knees on that leg and she does not wear compression stockings. 07/04/2021: In addition to the superficial wound on her right lower extremity that she opened up last week, she has opened 3 additional sites on the same leg. Her compression wraps were clearly not in correct position when she came to clinic today as they were at about the mid calf level rather than to the tibial tuberosity. She says that they slipped earlier and she had to come back on Friday to have them redone. The wound on her left lateral leg is perhaps slightly larger with some slough accumulation. 07/11/2021: The superficial wounds on her right lower extremity are nearly closed. They just have a thin layer of eschar overlying them. The wound on her left lateral leg  is a little bit shallower and has a bit of slough accumulation. 07/17/2021: The right medial lower extremity leg wounds have almost completely closed; one of them just has a tiny opening with a little bit of serous drainage. The left lateral leg wound is really unchanged. It seems to be stalled. 07/24/2021: The right leg wounds are completely closed. The left lateral leg wound is actually bigger today. It is a bit more tender. There is a minor accumulation of slough on the surface. 07/31/2021: The culture that I took last week was positive for MRSA. We added mupirocin under the silver alginate and I prescribed doxycycline. She has been tolerating this well.  The wound is smaller today but does have slough accumulation. No significant pain or drainage. 08/07/2021: She completed her course of doxycycline. The wound looks much better today. It is smaller and the periwound is less inflamed. She does have some slough accumulation on the surface. 08/14/2021: The wound continues to contract. There is slough on the wound surface, but the periwound is intact without inflammation or induration. 08/21/2021: The wound is down to just 2 small open sites. They both have a bit of slough accumulation. 08/28/2021: The wound is down to just 1 small open site with a little bit of slough and eschar accumulation. 09/04/2021: The wound continues to contract but remains open. It is clean without any slough. 09/12/2021: No real change in the overall wound dimensions, but it is flush with the surrounding skin. There is a little bit of slough accumulation. Edema control is good. 09/22/2021: The wound is smaller and even more superficial. Minimal slough accumulation. Good control of edema. 09/29/2021: The wound continues to contract. She reports that it was a little bit "twingey" during the week. It is a little bit dry on inspection today. Light accumulation of slough. Good edema control. Patient History Information obtained from Patient. Family History Heart Disease - Mother,Father, Hypertension - Mother,Father, Kidney Disease - Mother, Thyroid Problems - Mother, No family history of Cancer, Diabetes, Hereditary Spherocytosis, Lung Disease, Seizures, Stroke, Tuberculosis. Social History Never smoker, Marital Status - Single, Alcohol Use - Never, Drug Use - No History, Caffeine Use - Daily - coffee. Medical History Eyes Patient has history of Cataracts Hematologic/Lymphatic Patient has history of Lymphedema Cardiovascular Patient has history of Hypertension, Peripheral Venous Disease Integumentary (Skin) Denies history of History of Burn Musculoskeletal Patient has history of  Osteoarthritis Hospitalization/Surgery History - colostomy reversal. 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 Medical A Surgical History Notes nd Constitutional Symptoms (General Health) morbid obesity Cardiovascular schamberg disease, hyperlipidemia Gastrointestinal diverticulitis , h/o obstruction due to diverticulitis , colostomy and colostomy reversal Endocrine hypothyroidism Objective Constitutional No acute distress.. Vitals Time Taken: 1:15 PM, Height: 62 in, Weight: 226 lbs, BMI: 41.3, Temperature: 97.8 F, Pulse: 86 bpm, Respiratory Rate: 18 breaths/min, Blood Pressure: 120/75 mmHg. Respiratory Normal work of breathing on room air.. General Notes: 09/29/2021: The wound continues to contract. She reports that it was a little bit "twingey" during the week. It is a little bit dry on inspection today. Light accumulation of slough. Good edema control. Integumentary (Hair, Skin) Wound #3 status is Open. Original cause of wound was Blister. The date acquired was: 12/13/2020. The wound has been in treatment 31 weeks. The wound is located on the Left,Lateral Lower Leg. The wound measures 0.8cm length x 0.9cm width x 0.1cm depth; 0.565cm^2 area and 0.057cm^3 volume. There is Fat Layer (Subcutaneous Tissue) exposed. There is no tunneling or undermining noted. There is a medium  amount of serosanguineous drainage noted. The wound margin is distinct with the outline attached to the wound base. There is medium (34-66%) pink granulation within the wound bed. There is a medium (34-66%) amount of necrotic tissue within the wound bed including Adherent Slough. Assessment Active Problems ICD-10 Chronic venous hypertension (idiopathic) with ulcer and inflammation of left lower extremity Non-pressure chronic ulcer of other part of left lower leg with other specified severity Procedures Wound #3 Pre-procedure diagnosis of Wound #3 is a Venous Leg Ulcer located on the Left,Lateral Lower Leg .Severity of  Tissue Pre Debridement is: Fat layer exposed. There was a Selective/Open Wound Non-Viable Tissue Debridement with a total area of 0.72 sq cm performed by [name], MD. With the following instrument(s): Curette to remove Non-Viable tissue/material. Material removed includes [facility] after achieving pain control using Lidocaine 5% topical ointment. No specimens were taken. A time out was conducted at 13:36, prior to the start of the procedure. A Minimum amount of bleeding was controlled with Pressure. The procedure was tolerated well with a pain level of 0 throughout and a pain level of 0 following the procedure. Post Debridement Measurements: 0.8cm length x 0.9cm width x 0.1cm depth; 0.057cm^3 volume. Character of Wound/Ulcer Post Debridement is improved. Severity of Tissue Post Debridement is: Fat layer exposed. Post procedure Diagnosis Wound #3: Same as Pre-Procedure Plan Follow-up Appointments: Return Appointment in 1 week. - Dr. Celine Ahr Room 3 - Friday August 25th at 1:15pm Bathing/ Shower/ Hygiene: May shower with protection but do not get wound dressing(s) wet. - Ok to use [employer], can purchase at [facility], [facility], or [employer] Edema Control - Lymphedema / SCD / Other: Elevate legs to the level of the heart or above for 30 minutes daily and/or when sitting, a frequency of: - throughout the day Avoid standing for long periods of time. Exercise regularly Compression stocking or Garment 20-30 mm/Hg pressure to: - Brochure for Elastic Therapy. Leg measurements included WOUND #3: - Lower Leg Wound Laterality: Left, Lateral Cleanser: Soap and Water 1 x Per Week/30 Days Discharge Instructions: May shower and wash wound with dial antibacterial soap and water prior to dressing change. Cleanser: Wound Cleanser 1 x Per Week/30 Days Discharge Instructions: Cleanse the wound with wound cleanser prior to applying a clean dressing using gauze sponges, not tissue or cotton balls. Peri-Wound Care:  Sween Lotion (Moisturizing lotion) 1 x Per Week/30 Days Discharge Instructions: Apply moisturizing lotion as directed Prim Dressing: Promogran Prisma Matrix, 4.34 (sq in) (silver collagen) 1 x Per Week/30 Days ary Discharge Instructions: Moisten collagen with saline or hydrogel Secondary Dressing: Zetuvit Plus 4x8 in 1 x Per Week/30 Days Discharge Instructions: Apply over primary dressing as directed. Com pression Wrap: CoFlex TLC XL 2-layer Compression System 4x7 (in/yd) 1 x Per Week/30 Days Discharge Instructions: Apply CoFlex 2-layer compression as directed. (alt for 4 layer) 09/29/2021: The wound continues to contract. She reports that it was a little bit "twingey" during the week. It is a little bit dry on inspection today. Light accumulation of slough. Good edema control. I used a curette to debride slough from the wound. I think the fact that it got a bit dry during the week may account for the twinges of discomfort the patient was experiencing. We will continue use Prisma silver collagen but will moisten it with hydrogel rather than saline. Continue Coflex compression. Follow-up in 1 week. Electronic Signature(s) Signed: 09/29/2021 1:56:18 PM By: [name] MD FACS Entered By: [name] on 09/29/2021 13:56:18 -------------------------------------------------------------------------------- HxROS  Details Patient Name: Date of Service: [name], [name] 09/29/2021 1:15 PM Medical Record Number: [id number] Patient Account Number: [id number] Date of Birth/Sex: Treating RN: 08-24-1955 (66 y.o. [name] Primary Care Provider: Other Clinician: [name] Referring Provider: Treating Provider/Extender: [name] in Treatment: 31 Information Obtained From Patient Constitutional Symptoms (General Health) Medical History: Past Medical History Notes: morbid obesity Eyes Medical History: Positive for:  Cataracts Hematologic/Lymphatic Medical History: Positive for: Lymphedema Cardiovascular Medical History: Positive for: Hypertension; Peripheral Venous Disease Past Medical History Notes: schamberg disease, hyperlipidemia Gastrointestinal Medical History: Past Medical History Notes: diverticulitis , h/o obstruction due to diverticulitis , colostomy and colostomy reversal Endocrine Medical History: Past Medical History Notes: hypothyroidism Integumentary (Skin) Medical History: Negative for: History of Burn Musculoskeletal Medical History: Positive for: Osteoarthritis HBO Extended History Items Eyes: Cataracts Immunizations Pneumococcal Vaccine: Received Pneumococcal Vaccination: Yes Received Pneumococcal Vaccination On or After 60th Birthday: Yes Implantable Devices None Hospitalization / Surgery History Type of Hospitalization/Surgery colostomy reversal 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 Family and Social History Cancer: No; Diabetes: No; Heart Disease: Yes - Mother,Father; Hereditary Spherocytosis: No; Hypertension: Yes - Mother,Father; Kidney Disease: Yes - Mother; Lung Disease: No; Seizures: No; Stroke: No; Thyroid Problems: Yes - Mother; Tuberculosis: No; Never smoker; Marital Status - Single; Alcohol Use: Never; Drug Use: No History; Caffeine Use: Daily - coffee; Financial Concerns: No; Food, Clothing or Shelter Needs: No; Support System Lacking: No; Transportation Concerns: No Electronic Signature(s) Signed: 09/29/2021 4:05:21 PM By: [name] MD FACS Signed: 09/29/2021 5:30:22 PM By: [name] RN Entered By: [name] on 09/29/2021 13:53:04 -------------------------------------------------------------------------------- SuperBill Details Patient Name: Date of Service: Erin Face D. 09/29/2021 Medical Record Number: [id number] Patient Account Number: [id number] Date of Birth/Sex: Treating RN: [birth year] (66 y.o. [name] Primary  Care Provider: [name] Other Clinician: Referring Provider: Treating Provider/Extender: [name] in Treatment: 31 Diagnosis Coding ICD-10 Codes Code Description [phone] Chronic venous hypertension (idiopathic) with ulcer and inflammation of left lower extremity L97.828 Non-pressure chronic ulcer of other part of left lower leg with other specified severity Facility Procedures CPT4 Code: [id number] Description: [phone] - DEBRIDE WOUND 1ST 20 SQ CM OR < ICD-10 Diagnosis Description L97.828 Non-pressure chronic ulcer of other part of left lower leg with other specified se Modifier: verity Quantity: 1 Physician Procedures : CPT4 Code Description Modifier [id number] - WC PHYS LEVEL 3 - EST PT 25 ICD-10 Diagnosis Description 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 (idiopathic) with ulcer and inflammation of left lower extremity L97.828 Non-pressure chronic ulcer of other  part of left lower leg with other specified severity Quantity: 1 : [id number] - WC PHYS DEBR WO ANESTH 20 SQ CM ICD-10 Diagnosis Description L97.828 Non-pressure chronic ulcer of other part of left lower leg with other specified severity Quantity: 1 Electronic Signature(s) Signed: 09/29/2021 1:56:32 PM By: [name] MD FACS Entered By: [name] on 09/29/2021 13:56:32

## 2021-09-29 NOTE — Progress Notes (Signed)
[name], [name] ([id number]) Visit Report for 09/29/2021 Arrival Information Details Patient Name: Date of Service: [name], [name] 09/29/2021 1:15 PM Medical Record Number: [id number] Patient Account Number: [id number] Date of Birth/Sex: Treating RN: 07/08/1955 (66 y.o. [name] Primary Care Kamal Jurgens: [name] Other Clinician: Referring Malijah Lietz: Treating Hezzie Karim/Extender: [name] in Treatment: 54 Visit Information History Since Last Visit Added or deleted any medications: No Patient Arrived: [name] Any new allergies or adverse reactions: No Arrival Time: 13:16 Had a fall or experienced change in No Accompanied By: self activities of daily living that may affect Transfer Assistance: None risk of falls: Patient Identification Verified: Yes Signs or symptoms of abuse/neglect since last visito No Patient Requires Transmission-Based Precautions: No Hospitalized since last visit: No Patient Has Alerts: No Implantable device outside of the clinic excluding No cellular tissue based products placed in the center since last visit: Has Dressing in Place as Prescribed: Yes Pain Present Now: No Electronic Signature(s) Signed: 09/29/2021 5:30:22 PM By: [name] RN Entered By: [name] on 09/29/2021 13:16:52 -------------------------------------------------------------------------------- Compression Therapy Details Patient Name: Date of Service: [name], [name]. 09/29/2021 1:15 PM Medical Record Number: [id number] Patient Account Number: [id number] Date of Birth/Sex: Treating RN: Dec 16, 1955 (66 y.o. [name] Primary Care Aby Gessel: [name] Other Clinician: Referring Levaughn Puccinelli: Treating Darneisha Windhorst/Extender: [name] in Treatment: 31 Compression Therapy Performed for Wound Assessment: Wound #3 Left,Lateral Lower Leg Performed By: Clinician [name], RN Compression Type:  Double Layer Post Procedure Diagnosis Same as Pre-procedure Electronic Signature(s) Signed: 09/29/2021 5:30:22 PM By: [name] RN Entered By: [name] on 09/29/2021 17:25:03 -------------------------------------------------------------------------------- Encounter Discharge Information Details Patient Name: Date of Service: Erin Gerold [name] 09/29/2021 1:15 PM Medical Record Number: [id number] Patient Account Number: [id number] Date of Birth/Sex: Treating RN: 09-03-1955 (66 y.o. [name] Primary Care Jareli Highland: [name] Other Clinician: Referring Lessa Huge: Treating Joshawn Crissman/Extender: [name] in Treatment: 31 Encounter Discharge Information Items Post Procedure Vitals Discharge Condition: Stable Temperature (F): 97.8 Ambulatory Status: Cane Pulse (bpm): 86 Discharge Destination: Home Respiratory Rate (breaths/min): 18 Transportation: Private Auto Blood Pressure (mmHg): 120/75 Accompanied By: self Schedule Follow-up Appointment: Yes Clinical Summary of Care: Patient Declined Electronic Signature(s) Signed: 09/29/2021 5:30:22 PM By: [name] RN Entered By: [name] on 09/29/2021 17:29:45 -------------------------------------------------------------------------------- Lower Extremity Assessment Details Patient Name: Date of Service: [name], [name]. 09/29/2021 1:15 PM Medical Record Number: [id number] Patient Account Number: [id number] Date of Birth/Sex: Treating RN: Jul 04, 1955 (66 y.o. [name] Primary Care Alianah Lofton: [name] Other Clinician: Referring Marguerite Jarboe: Treating Starlene Consuegra/Extender: [name] in Treatment: 31 Edema Assessment Assessed: [Left: No] [Right: No] Edema: [Left: Ye] [Right: s] Calf Left: Right: Point of Measurement: 29 cm From Medial Instep 43 cm Ankle Left: Right: Point of Measurement: 10 cm From Medial Instep 24.1 cm Electronic  Signature(s) Signed: 09/29/2021 5:30:22 PM By: [name] RN Entered By: [name] on 09/29/2021 13:21:27 -------------------------------------------------------------------------------- Multi Wound Chart Details Patient Name: Date of Service: Erin Face D. 09/29/2021 1:15 PM Medical Record Number: [id number] Patient Account Number: [id number] Date of Birth/Sex: Treating RN: 12/30/1955 (66 y.o. [name] Primary Care Carren Blakley: [name] Other Clinician: Referring Jourden Delmont: Treating Angelice Piech/Extender: [name] in Treatment: 31 Vital Signs Height(in): 62 Pulse(bpm): 86 Weight(lbs): 226 Blood Pressure(mmHg): 120/75 Body Mass Index(BMI): 41.3 Temperature(F): 97.8 Respiratory Rate(breaths/min): 18 Photos: [3:No Photos Left, Lateral Lower Leg] [N/A:N/A N/A] Wound Location: [3:Blister] [N/A:N/A]  Wounding Event: [3:Venous Leg Ulcer] [N/A:N/A] Primary Etiology: [3:Cataracts, Lymphedema,] [N/A:N/A] Comorbid History: [3:Hypertension, Peripheral Venous Disease, Osteoarthritis 12/13/2020] [N/A:N/A] Date Acquired: [3:31] [N/A:N/A] Weeks of Treatment: [3:Open] [N/A:N/A] Wound Status: [3:No] [N/A:N/A] Wound Recurrence: [3:0.8x0.9x0.1] [N/A:N/A] Measurements L x W x D (cm) [3:0.565] [N/A:N/A] A (cm) : rea [3:0.057] [N/A:N/A] Volume (cm) : [3:98.10%] [N/A:N/A] % Reduction in A [3:rea: 99.00%] [N/A:N/A] % Reduction in Volume: [3:Full Thickness Without Exposed] [N/A:N/A] Classification: [3:Support Structures Medium] [N/A:N/A] Exudate A mount: [3:Serosanguineous] [N/A:N/A] Exudate Type: [3:red, [name]] [N/A:N/A] Exudate Color: [3:Distinct, outline attached] [N/A:N/A] Wound Margin: [3:Medium (34-66%)] [N/A:N/A] Granulation A mount: [3:Pink] [N/A:N/A] Granulation Quality: [3:Medium (34-66%)] [N/A:N/A] Necrotic A mount: [3:Fat Layer (Subcutaneous Tissue): Yes N/A] Exposed Structures: [3:Fascia: No Tendon: No Muscle: No Joint: No  Bone: No Medium (34-66%)] [N/A:N/A] Epithelialization: [3:Debridement - Selective/Open Wound N/A] Debridement: Pre-procedure Verification/Time Out 13:36 [N/A:N/A] Taken: [3:Lidocaine 5% topical ointment] [N/A:N/A] Pain Control: [3:Slough] [N/A:N/A] Tissue Debrided: [3:Non-Viable Tissue] [N/A:N/A] Level: [3:0.72] [N/A:N/A] Debridement A (sq cm): [3:rea Curette] [N/A:N/A] Instrument: [3:Minimum] [N/A:N/A] Bleeding: [3:Pressure] [N/A:N/A] Hemostasis A chieved: [3:0] [N/A:N/A] Procedural Pain: [3:0] [N/A:N/A] Post Procedural Pain: [3:Procedure was tolerated well] [N/A:N/A] Debridement Treatment Response: [3:0.8x0.9x0.1] [N/A:N/A] Post Debridement Measurements L x W x D (cm) [3:0.057] [N/A:N/A] Post Debridement Volume: (cm) [3:Debridement] [N/A:N/A] Treatment Notes Electronic Signature(s) Signed: 09/29/2021 1:51:49 PM By: [name] MD FACS Signed: 09/29/2021 5:30:22 PM By: [name] RN Entered By: [name] on 09/29/2021 13:51:49 -------------------------------------------------------------------------------- Multi-Disciplinary Care Plan Details Patient Name: Date of Service: Erin Face D. 09/29/2021 1:15 PM Medical Record Number: [id number] Patient Account Number: [id number] Date of Birth/Sex: Treating RN: 03-Feb-1956 (66 y.o. [name] Primary Care Dinna Severs: Other Clinician: [name] Referring Dwan Hemmelgarn: Treating Tarea Skillman/Extender: [name] in Treatment: 63 Multidisciplinary Care Plan reviewed with physician Active Inactive Venous Leg Ulcer Nursing Diagnoses: Actual venous Insuffiency (use after diagnosis is confirmed) Goals: Patient will maintain optimal edema control Date Initiated: 02/21/2021 Target Resolution Date: 12/12/2021 Goal Status: Active Patient/caregiver will verbalize understanding of disease process and disease management Date Initiated: 02/21/2021 Date Inactivated: 03/29/2021 Target  Resolution Date: 03/24/2021 Goal Status: Met Interventions: Assess peripheral edema status every visit. Compression as ordered Provide education on venous insufficiency Notes: Wound/Skin Impairment Nursing Diagnoses: Impaired tissue integrity Knowledge deficit related to ulceration/compromised skin integrity Goals: Patient/caregiver will verbalize understanding of skin care regimen Date Initiated: 02/21/2021 Target Resolution Date: 12/12/2021 Goal Status: Active Ulcer/skin breakdown will have a volume reduction of 30% by week 4 Date Initiated: 02/21/2021 Date Inactivated: 03/29/2021 Target Resolution Date: 03/24/2021 Goal Status: Unmet Unmet Reason: infection Ulcer/skin breakdown will have a volume reduction of 50% by week 8 Date Initiated: 03/29/2021 Date Inactivated: 04/19/2021 Target Resolution Date: 04/21/2021 Goal Status: Met Interventions: Assess patient/caregiver ability to obtain necessary supplies Assess patient/caregiver ability to perform ulcer/skin care regimen upon admission and as needed Assess ulceration(s) every visit Provide education on ulcer and skin care Notes: Electronic Signature(s) Signed: 09/29/2021 5:30:22 PM By: [name] RN Entered By: [name] on 09/29/2021 17:25:36 -------------------------------------------------------------------------------- Pain Assessment Details Patient Name: Date of Service: [name], [name] 09/29/2021 1:15 PM Medical Record Number: [id number] Patient Account Number: [id number] Date of Birth/Sex: Treating RN: 01/17/1956 (66 y.o. [name] Primary Care Adden Strout: [name] Other Clinician: Referring Loreda Silverio: Treating Tinna Kolker/Extender: [name] in Treatment: 31 Active Problems Location of Pain Severity and Description of Pain Patient Has Paino No Site Locations Pain Management and Medication Current Pain Management: Electronic Signature(s) Signed: 09/29/2021  5:30:22 PM By: [name]  RN Entered By: [name] on 09/29/2021 13:21:05 -------------------------------------------------------------------------------- Patient/Caregiver Education Details Patient Name: Date of Service: [name], [name] 8/18/2023andnbsp1:15 PM Medical Record Number: [id number] Patient Account Number: [id number] Date of Birth/Gender: Treating RN: 28-Aug-1955 (66 y.o. [name] Primary Care Physician: [name] Other Clinician: Referring Physician: Treating Physician/Extender: [name] in Treatment: 31 Education Assessment Education Provided To: Patient Education Topics Provided Wound/Skin Impairment: Methods: Explain/Verbal Responses: Return demonstration correctly Electronic Signature(s) Signed: 09/29/2021 5:30:22 PM By: [name] RN Entered By: [name] on 09/29/2021 17:26:31 -------------------------------------------------------------------------------- Wound Assessment Details Patient Name: Date of Service: [name], [name]. 09/29/2021 1:15 PM Medical Record Number: [id number] Patient Account Number: [id number] Date of Birth/Sex: Treating RN: 09-03-1955 (66 y.o. [name] Primary Care Raileigh Sabater: [name] Other Clinician: Referring Rheagan Nayak: Treating Walter Min/Extender: [name] in Treatment: 31 Wound Status Wound Number: 3 Primary Venous Leg Ulcer Etiology: Wound Location: Left, Lateral Lower Leg Wound Open Wounding Event: Blister Status: Date Acquired: 12/13/2020 Comorbid Cataracts, Lymphedema, Hypertension, Peripheral Venous Weeks Of Treatment: 31 History: Disease, Osteoarthritis Clustered Wound: No Wound Measurements Length: (cm) 0.8 Width: (cm) 0.9 Depth: (cm) 0.1 Area: (cm) 0.565 Volume: (cm) 0.057 % Reduction in Area: 98.1% % Reduction in Volume: 99% Epithelialization: Medium (34-66%) Tunneling: No Undermining:  No Wound Description Classification: Full Thickness Without Exposed Support Structures Wound Margin: Distinct, outline attached Exudate Amount: Medium Exudate Type: Serosanguineous Exudate Color: red, [name] Foul Odor After Cleansing: No Slough/Fibrino Yes Wound Bed Granulation Amount: Medium (34-66%) Exposed Structure Granulation Quality: Pink Fascia Exposed: No Necrotic Amount: Medium (34-66%) Fat Layer (Subcutaneous Tissue) Exposed: Yes Necrotic Quality: Adherent Slough Tendon Exposed: No Muscle Exposed: No Joint Exposed: No Bone Exposed: No Treatment Notes Wound #3 (Lower Leg) Wound Laterality: Left, Lateral Cleanser Soap and Water Discharge Instruction: May shower and wash wound with [name] antibacterial soap and water prior to dressing change. Wound Cleanser Discharge Instruction: Cleanse the wound with wound cleanser prior to applying a clean dressing using gauze sponges, not tissue or cotton balls. Peri-Wound Care Sween Lotion (Moisturizing lotion) Discharge Instruction: Apply moisturizing lotion as directed Topical Primary Dressing Promogran Prisma Matrix, 4.34 (sq in) (silver collagen) Discharge Instruction: Moisten collagen with saline or hydrogel Secondary Dressing Zetuvit Plus 4x8 in Discharge Instruction: Apply over primary dressing as directed. Secured With Compression Wrap CoFlex TLC XL 2-layer Compression System 4x7 (in/yd) Discharge Instruction: Apply CoFlex 2-layer compression as directed. (alt for 4 layer) Compression Stockings Add-Ons Electronic Signature(s) Signed: 09/29/2021 5:30:22 PM By: [name] RN Entered By: [name] on 09/29/2021 13:30:39 -------------------------------------------------------------------------------- Vitals Details Patient Name: Date of Service: Erin Gerold [name] 09/29/2021 1:15 PM Medical Record Number: [id number] Patient Account Number: [id number] Date of Birth/Sex: Treating RN: 08/07/1955 (66 y.o. [name] Primary Care Tammala Weider: [name] Other Clinician: Referring Hilberto Burzynski: Treating Deshawnda Acrey/Extender: [name] in Treatment: 31 Vital Signs Time Taken: 13:15 Temperature (F): 97.8 Height (in): 62 Pulse (bpm): 86 Weight (lbs): 226 Respiratory Rate (breaths/min): 18 Body Mass Index (BMI): 41.3 Blood Pressure (mmHg): 120/75 Reference Range: 80 - 120 mg / dl Electronic Signature(s) Signed: 09/29/2021 5:30:22 PM By: [name] RN Entered By: [name] on 09/29/2021 13:19:59

## 2021-10-03 DIAGNOSIS — Z1211 Encounter for screening for malignant neoplasm of colon: Secondary | ICD-10-CM | POA: Diagnosis not present

## 2021-10-03 DIAGNOSIS — K5730 Diverticulosis of large intestine without perforation or abscess without bleeding: Secondary | ICD-10-CM | POA: Diagnosis not present

## 2021-10-03 DIAGNOSIS — I10 Essential (primary) hypertension: Secondary | ICD-10-CM | POA: Diagnosis not present

## 2021-10-03 DIAGNOSIS — E119 Type 2 diabetes mellitus without complications: Secondary | ICD-10-CM | POA: Diagnosis not present

## 2021-10-06 ENCOUNTER — Encounter (HOSPITAL_BASED_OUTPATIENT_CLINIC_OR_DEPARTMENT_OTHER): Payer: 59 | Admitting: General Surgery

## 2021-10-06 DIAGNOSIS — L97822 Non-pressure chronic ulcer of other part of left lower leg with fat layer exposed: Secondary | ICD-10-CM | POA: Diagnosis not present

## 2021-10-06 DIAGNOSIS — I87332 Chronic venous hypertension (idiopathic) with ulcer and inflammation of left lower extremity: Secondary | ICD-10-CM | POA: Diagnosis not present

## 2021-10-06 DIAGNOSIS — L97828 Non-pressure chronic ulcer of other part of left lower leg with other specified severity: Secondary | ICD-10-CM | POA: Diagnosis not present

## 2021-10-06 DIAGNOSIS — L817 Pigmented purpuric dermatosis: Secondary | ICD-10-CM | POA: Diagnosis not present

## 2021-10-06 DIAGNOSIS — I10 Essential (primary) hypertension: Secondary | ICD-10-CM | POA: Diagnosis not present

## 2021-10-06 DIAGNOSIS — E669 Obesity, unspecified: Secondary | ICD-10-CM | POA: Diagnosis not present

## 2021-10-06 DIAGNOSIS — I872 Venous insufficiency (chronic) (peripheral): Secondary | ICD-10-CM | POA: Diagnosis not present

## 2021-10-06 DIAGNOSIS — M1990 Unspecified osteoarthritis, unspecified site: Secondary | ICD-10-CM | POA: Diagnosis not present

## 2021-10-06 DIAGNOSIS — I739 Peripheral vascular disease, unspecified: Secondary | ICD-10-CM | POA: Diagnosis not present

## 2021-10-06 DIAGNOSIS — Z6841 Body Mass Index (BMI) 40.0 and over, adult: Secondary | ICD-10-CM | POA: Diagnosis not present

## 2021-10-06 NOTE — Progress Notes (Signed)
[name], TSCHETTER ([id number]) Visit Report for 10/06/2021 Chief Complaint Document Details Patient Name: Date of Service: [name], Erin 10/06/2021 1:15 PM Medical Record Number: [id number] Patient Account Number: [id number] Date of Birth/Sex: Treating RN: 08-10-1955 (66 y.o. [name] Primary Care Provider: [name] Other Clinician: Referring Provider: Treating Provider/Extender: [name] in Treatment: 73 Information Obtained from: Patient Chief Complaint 04/19/2021: The patient is here for ongoing follow-up regarding 2 left lower extremity wounds. Electronic Signature(s) Signed: 10/06/2021 2:03:45 PM By: [name] MD FACS Entered By: [name] on 10/06/2021 14:03:45 -------------------------------------------------------------------------------- Debridement Details Patient Name: Date of Service: Erin Gerold [name] 10/06/2021 1:15 PM Medical Record Number: [id number] Patient Account Number: [id number] Date of Birth/Sex: Treating RN: Apr 09, 1955 (66 y.o. [name] Primary Care Provider: [name] Other Clinician: Referring Provider: Treating Provider/Extender: [name] in Treatment: 32 Debridement Performed for Assessment: Wound #3 Left,Lateral Lower Leg Performed By: Physician [name], MD Debridement Type: Debridement Severity of Tissue Pre Debridement: Fat layer exposed Level of Consciousness (Pre-procedure): Awake and Alert Pre-procedure Verification/Time Out Yes - 13:55 Taken: Start Time: 13:55 T Area Debrided (L x W): otal 0.9 (cm) x 1 (cm) = 0.9 (cm) Tissue and other material debrided: Non-Viable, Slough, Slough Level: Non-Viable Tissue Debridement Description: Selective/Open Wound Instrument: Curette Bleeding: Minimum Hemostasis Achieved: Pressure End Time: 13:56 Procedural Pain: 0 Post Procedural Pain: 0 Response to Treatment: Procedure was tolerated  well Level of Consciousness (Post- Awake and Alert procedure): Post Debridement Measurements of Total Wound Length: (cm) 0.9 Width: (cm) 1 Depth: (cm) 0.1 Volume: (cm) 0.071 Character of Wound/Ulcer Post Debridement: Improved Severity of Tissue Post Debridement: Fat layer exposed Post Procedure Diagnosis Same as Pre-procedure Electronic Signature(s) Signed: 10/06/2021 4:55:05 PM By: [name] MD FACS Signed: 10/06/2021 6:25:09 PM By: [name] RN Entered By: [name] on 10/06/2021 14:00:42 -------------------------------------------------------------------------------- HPI Details Patient Name: Date of Service: Erin Face D. 10/06/2021 1:15 PM Medical Record Number: [id number] Patient Account Number: [id number] Date of Birth/Sex: Treating RN: Apr 17, 1955 (66 y.o. 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 She tells [name] that she fell on the gravel driveway in July. She developed injuries on her distal lower leg which have not healed. She saw her primary physician on 11/15/2017 who noted her left shin injuries. Gave her antibiotics. At that point the wounds were almost circumferential however most were less than 1.5 cm. Weeping edema fluid was noted. She was referred here for evaluation. The patient has a history of chronic lower extremity edema. 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 She has had prior history with leg weeping fluid. She does not wear compression stockings. She is not doing anything specific to these wound areas. The patient has a history of obesity, arthritis,  peripheral vascular disease hypertension lower extremity edema and Schamberg's disease ABI in our clinic was 1.3 on the left 12/17/2017; patient readmitted to the clinic last week. 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 She also has lymphedema. Put her in 3 layer compression and silver alginate last week. She has 3 small wounds with depth just lateral to the tibia.  More problematically than this she has numerous shallow areas some of which are almost canal like in shape with tightly adherent painful debris. It would be very difficult and time- consuming to go through this and attempt to individually debride all these areas.. I changed her to collagen today to see if that would help with any of the surface debris on some of these wounds. Otherwise we will not be able to put this in compression we have to have someone change the dressing. The patient is not eligible for home health 12/25/17 on evaluation today patient actually appears to be doing rather well in regard to the ulcer on her lower extremity. Fortunately there does not appear to be evidence of infection at this time. She has been tolerating the dressing changes without complication. This includes the compression wrap. The only issue she had was that the wrap was initially placed over her bunion region which actually calls her some discomfort and pain. Other than that things seem to be going rather well. 01/01/2018 Seen today for follow-up and management of left lower extremity wound and lymphedema. T oday she presents with a new wound towards to the left lateral LE. Recently treated with a 7 day course of amoxicillin; reason for antibiotic dose is unknown at this time. Tolerating current treatment of collagen with 4- layer wraps. She obtained a venous reflux study on 12/25/17. Studies show on the right abnormal reflux times of the popliteal vein, great saphenous vein at the saphenofemoral  junction at the proximal thigh, great saphenous vein at the mid calf, and origin of the small saphenous vein.No superficial thrombosis. No deep vein thrombosis in the common femoral, femoral,and popliteal veins. Left abnormal reflex times as well of the common femoral vein, popliteal vein, and a great saphenous vein at the saphenofemoral junction, In great saphenous vein at the mid thigh w/o thrombosis. Has any issues or concerns during visit today. Recommended follow-up to vascular specialist due to abnormalities from the venous reflux study. Denies fever, pain, chills, dizziness, nausea, or vomiting. 01/08/18 upon evaluation today the patient actually seems to be showing some signs of improvement in my opinion at this point in regard to the lower extremity ulcerated areas. She still has a lot of drainage but fortunately nothing that appears to be too significant currently. I have been very happy with the overall progress I see today compared to where things were during the last evaluation that I had with her. Nonetheless she has not had her appointment with the vein specialist as of yet in fact we were able to get this approved for her today and confirmed with them she will be seeing them on December 26. Nonetheless in general I do feel like the compression wraps is doing well for her. 01/17/18; quite a bit of improvement since last time I saw this patient she has a small open area remaining on the left lateral calf and even smaller area medially. She has lymphedema chronic stasis changes with distal skin fibrosis. She has an appointment with vascular surgery later this month 01/24/2018; the patient's medial leg has closed. Still a small open area on the left lateral leg. She states that the 4 layer compression we put on last week was too tight and she had to take it off over a few days ago. We have had resultant increase in her lymphedema in the dorsal foot and a proximal calf. Fortunately that does  not seem to have resulted in any deterioration in her wounds  The patient is going to need compression stockings. We have given her measurements to phone elastic therapy in [place]. She has vascular surgery consult on December 26 02/07/18; she's had continuous contraction on the left lateral leg wound which is now very small. Currently using silver alginate under 3 layer compression She saw Dr. Trula Slade of vascular surgery on 02/06/18. It was noted that she had a normal reflux times in the popliteal vein, great saphenous vein at the saphenofemoral junction, great saphenous vein at the proximal thigh great saphenous vein at the mid calf and origin of the small saphenous vein. It was noted that she had significant reflux in the left saphenous veins with diameter measurements in the 0.7-0.8 cm range. It was felt she would benefit from laser ablation to help minimize the risk of ulcer recurrence. It was recommended that she wear 20-30 thigh-high compression stockings and have follow-up in 4-6 weeks 02/17/2018; I thought this lady would be healed however her compression slipped down and she developed increasing swelling and the wound is actually larger. 1/13; we had deterioration last week after the patient's compression slipped down and she developed periwound swelling. I increased her compression before layers we have been using silver alginate we are a lot better again today. She has her compression stockings in waiting 1/23; the patient's wounds are totally healed today. She has her stockings. This was almost circumferential skin damage. She follows up with Dr. Trula Slade of vascular surgery next Monday. READMISSION 02/21/2021 This is a now 66 year old woman that we had in clinic here discharging in January 2020 with wounds on her left calf chronic venous insufficiency. She was discharged with 30/40 stockings. It does not sound like she has worn stockings in about a year largely from not being able to get  them on herself. In November she developed new blisters on her legs an area laterally is opened into a fairly sizable wound. She has weeping posteriorly as well. She has been using Neosporin and Band-Aids. The patient did see Dr. Trula Slade in 2019 and 2020. He felt she might benefit from laser ablation in her left saphenous veins although because of the wound that healed I do not think he went through with it. She might benefit from seeing him again. Her ABI on the left is 1. 1/17; patient's wound on the posterior left calf is closed she has the 2 large areas last week. We put her in 4-layer compression for edema control is a lot better. Our intake nurse noted greenish drainage and odor. We have been using silver alginate 1/25; PCR culture I did have the substantial wound area on the left lateral lower leg showed staff aureus and group A strep. Low titers of coag negative staph which are probably skin contaminants. Resistance detected to tetracycline methicillin and macrolides. I gave her a starter kit of Nuzyra 150 mg x 3 for 2 days then 300 mg for a further 7 days. Marked odor considerable increase in surrounding erythema. We are using Iodoflex last week however I have changed to silver alginate with underlying Bactroban 2/1; she is completing her [place] tomorrow. The degree of erythema around the wounds looks a lot better. Odor has improved. I gave her silver alginate and Bactroban last week and changing her back to Iodoflex to continue with ongoing debridement 2/8; the periwound looks a lot better. Surface of the wound also looks somewhat better although there is still ongoing debridement to be done we have been using Iodoflex under compression. Primary dressing  and silver alginate 2/15; left posterior calf. Some improvement in the surface of the wound but still very gritty we have been using Iodoflex under compression. 04/05/2021: Left lateral posterior calf. She continues to have significant  amounts of drainage, some of which is probably comprised of the Iodoflex microbleeds. There is no odor to the drainage. The satellite lesion on the more posterior aspect of the calf is epithelializing nicely and has contracted quite a bit. There is robust granulation tissue in the dominant wound with minimal adherent slough. 04/12/2021: The satellite lesion has nearly closed. She continues to have good granulation tissue with minimal slough of the larger primary wound. She continues to have a fair amount of drainage, but I think this is less secondary to switching her dressing from Iodoflex to Prisma. 04/19/2021: The satellite lesion is almost completely epithelialized. The larger primary wound continues to contract with good granulation tissue and minimal slough. She is currently in [employer] with compression. 04/26/2021: The satellite lesion has closed. The larger primary wound has contracted further and the granulation tissue is robust without being hypertrophic. Minimal slough is present. 05/03/2021: The satellite lesion remains closed. The larger primary wound has a bit of slough, but has also contracted further with good perimeter epithelialization. Good granulation tissue at the wound surface. There was some greenish drainage on the dressing when it was removed; it is not the blue-green typically associated with Pseudomonas aeruginosa, however. 05/10/2021: The primary wound continues to contract. There is minimal slough. The granulation tissue at 9:00 is a bit hypertrophic. No significant drainage. No odor. 05/17/2021: The wound is a little bit smaller today with good granulation tissue. Minimal slough. No significant drainage or odor. 05/24/2021: The wound continues to contract and has a nice base of granulation tissue. Small amount of slough. No concern for infection. 05/31/2021: For some reason, the wound measured slightly larger today but overall it still appears to be in good condition with a nice base  of granulation tissue and minimal slough. 06/07/2021: The wound is smaller today. Good granulation tissue and minimal slough. 06/14/2021: The wound is unchanged in size. She continues to accumulate some slough. Good granulation tissue on the surface. 06/20/2021: The wound is smaller today. Minimal slough with good granulation tissue. 06/27/2021: The wound on her left lateral leg is smaller today with just a bit of slough and eschar accumulation. Unfortunately, she has opened a new superficial wound on her right lower extremity. She has 3+ pitting edema to the knees on that leg and she does not wear compression stockings. 07/04/2021: In addition to the superficial wound on her right lower extremity that she opened up last week, she has opened 3 additional sites on the same leg. Her compression wraps were clearly not in correct position when she came to clinic today as they were at about the mid calf level rather than to the tibial tuberosity. She says that they slipped earlier and she had to come back on Friday to have them redone. The wound on her left lateral leg is perhaps slightly larger with some slough accumulation. 07/11/2021: The superficial wounds on her right lower extremity are nearly closed. They just have a thin layer of eschar overlying them. The wound on her left lateral leg is a little bit shallower and has a bit of slough accumulation. 07/17/2021: The right medial lower extremity leg wounds have almost completely closed; one of them just has a tiny opening with a little bit of serous drainage. The left lateral leg  wound is really unchanged. It seems to be stalled. 07/24/2021: The right leg wounds are completely closed. The left lateral leg wound is actually bigger today. It is a bit more tender. There is a minor accumulation of slough on the surface. 07/31/2021: The culture that I took last week was positive for MRSA. We added mupirocin under the silver alginate and I prescribed doxycycline. She  has been tolerating this well. The wound is smaller today but does have slough accumulation. No significant pain or drainage. 08/07/2021: She completed her course of doxycycline. The wound looks much better today. It is smaller and the periwound is less inflamed. She does have some slough accumulation on the surface. 08/14/2021: The wound continues to contract. There is slough on the wound surface, but the periwound is intact without inflammation or induration. 08/21/2021: The wound is down to just 2 small open sites. They both have a bit of slough accumulation. 08/28/2021: The wound is down to just 1 small open site with a little bit of slough and eschar accumulation. 09/04/2021: The wound continues to contract but remains open. It is clean without any slough. 09/12/2021: No real change in the overall wound dimensions, but it is flush with the surrounding skin. There is a little bit of slough accumulation. Edema control is good. 09/22/2021: The wound is smaller and even more superficial. Minimal slough accumulation. Good control of edema. 09/29/2021: The wound continues to contract. She reports that it was a little bit "twingey" during the week. It is a little bit dry on inspection today. Light accumulation of slough. Good edema control. 10/06/2021: The wound is about the same size today. There is a little slough on the surface. Edema control is good. Electronic Signature(s) Signed: 10/06/2021 2:04:17 PM By: [name] MD FACS Entered By: [name] on 10/06/2021 14:04:17 -------------------------------------------------------------------------------- Physical Exam Details Patient Name: Date of Service: STARNISHA, BATREZ D. 10/06/2021 1:15 PM Medical Record Number: [id number] Patient Account Number: [id number] Date of Birth/Sex: Treating RN: [birth year] (66 y.o. [name] Primary Care Provider: [name] Other Clinician: Referring Provider: Treating Provider/Extender: [name] in Treatment: 32 Constitutional . . . . No acute distress.Marland Kitchen Respiratory Normal work of breathing on room air.. Notes 10/06/2021: The wound is about the same size today. There is a little slough on the surface. Edema control is good. Electronic Signature(s) Signed: 10/06/2021 2:04:45 PM By: [name] MD FACS Entered By: [name] on 10/06/2021 14:04:45 -------------------------------------------------------------------------------- Physician Orders Details Patient Name: Date of Service: Erin Gerold [name] 10/06/2021 1:15 PM Medical Record Number: [id number] Patient Account Number: [id number] Date of Birth/Sex: Treating RN: 08-30-1955 (66 y.o. [name] Primary Care Provider: [name] Other Clinician: Referring Provider: Treating Provider/Extender: [name] in Treatment: [phone] Verbal / Phone Orders: No Diagnosis Coding ICD-10 Coding Code Description 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 (idiopathic) with ulcer and inflammation of left lower extremity L97.828 Non-pressure chronic ulcer of other part of left lower leg with other specified severity Follow-up Appointments ppointment in 1 week. - Dr. Celine Ahr Room 3 - Friday 10/13/21 at 12:30pm Return A Bathing/ Shower/ Hygiene May shower with protection but do not get wound dressing(s) wet. - Ok to use [employer], can purchase at [facility], [facility], or [employer] Edema Control - Lymphedema / SCD / Other Elevate legs to the level of the heart or above for 30 minutes daily and/or when sitting, a frequency of: - throughout the day Avoid standing for long periods of time.  Exercise regularly Compression stocking or Garment 20-30 mm/Hg pressure to: - Brochure for Elastic Therapy. Leg measurements included Wound Treatment Wound #3 - Lower Leg Wound Laterality: Left, Lateral Cleanser: Soap and Water 1 x Per Week/30 Days Discharge Instructions: May shower and wash  wound with dial antibacterial soap and water prior to dressing change. Cleanser: Wound Cleanser 1 x Per Week/30 Days Discharge Instructions: Cleanse the wound with wound cleanser prior to applying a clean dressing using gauze sponges, not tissue or cotton balls. Peri-Wound Care: Sween Lotion (Moisturizing lotion) 1 x Per Week/30 Days Discharge Instructions: Apply moisturizing lotion as directed Prim Dressing: Promogran Prisma Matrix, 4.34 (sq in) (silver collagen) 1 x Per Week/30 Days ary Discharge Instructions: Moisten collagen with saline or hydrogel Secondary Dressing: Zetuvit Plus 4x8 in 1 x Per Week/30 Days Discharge Instructions: Apply over primary dressing as directed. Compression Wrap: CoFlex TLC XL 2-layer Compression System 4x7 (in/yd) 1 x Per Week/30 Days Discharge Instructions: Apply CoFlex 2-layer compression as directed. (alt for 4 layer) Electronic Signature(s) Signed: 10/06/2021 4:55:05 PM By: [name] MD FACS Previous Signature: 10/06/2021 1:57:09 PM Version By: [name] MD FACS Entered By: [name] on 10/06/2021 14:05:04 -------------------------------------------------------------------------------- Problem List Details Patient Name: Date of Service: Erin Face D. 10/06/2021 1:15 PM Medical Record Number: [id number] Patient Account Number: [id number] Date of Birth/Sex: Treating RN: 04/09/1955 (66 y.o. [name] Primary Care Provider: [name] Other Clinician: Referring Provider: Treating Provider/Extender: [name] in Treatment: 32 Active Problems ICD-10 Encounter Code Description Active Date MDM Diagnosis 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 (idiopathic) with ulcer and inflammation of left 02/21/2021 No Yes lower extremity L97.828 Non-pressure chronic ulcer of other part of left lower leg with other specified 02/21/2021 No Yes severity Inactive Problems ICD-10 Code Description Active Date  Inactive Date L03.116 Cellulitis of left lower limb 03/08/2021 03/08/2021 L97.811 Non-pressure chronic ulcer of other part of right lower leg limited to breakdown of skin 06/27/2021 06/27/2021 Resolved Problems Electronic Signature(s) Signed: 10/06/2021 2:03:32 PM By: [name] MD FACS Entered By: [name] on 10/06/2021 14:03:32 -------------------------------------------------------------------------------- Progress Note Details Patient Name: Date of Service: Erin Face D. 10/06/2021 1:15 PM Medical Record Number: [id number] Patient Account Number: [id number] Date of Birth/Sex: Treating RN: 20-Sep-1955 (66 y.o. [name] Primary Care Provider: [name] Other Clinician: Referring Provider: Treating Provider/Extender: [name] in Treatment: 37 Subjective Chief Complaint Information obtained from Patient 04/19/2021: The patient is here for ongoing follow-up regarding 2 left lower extremity wounds. 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 She tells [name] that she fell on the gravel driveway in July. She developed injuries on her distal lower leg which have not healed. She saw her primary physician on 11/15/2017 who noted her left shin injuries. Gave her antibiotics. At that point the wounds were almost circumferential however most were less than 1.5 cm. Weeping edema fluid was noted. She was referred here for evaluation. The patient has a history of chronic lower extremity edema. 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 She has had prior history with leg weeping fluid. She does not wear compression stockings. She is not doing anything specific to these wound areas. The patient has a history of obesity, arthritis, peripheral vascular  disease hypertension lower extremity edema and Schamberg's disease ABI in our  clinic was 1.3 on the left 12/17/2017; patient readmitted to the clinic last week. 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 She also has lymphedema. Put her in 3 layer compression and silver alginate last week. She has 3 small wounds with depth just lateral to the tibia. More problematically than this she has numerous shallow areas some of which are almost canal like in shape with tightly adherent painful debris. It would be very difficult and time- consuming to go through this and attempt to individually debride all these areas.. I changed her to collagen today to see if that would help with any of the surface debris on some of these wounds. Otherwise we will not be able to put this in compression we have to have someone change the dressing. The patient is not eligible for home health 12/25/17 on evaluation today patient actually appears to be doing rather well in regard to the ulcer on her lower extremity. Fortunately there does not appear to be evidence of infection at this time. She has been tolerating the dressing changes without complication. This includes the compression wrap. The only issue she had was that the wrap was initially placed over her bunion region which actually calls her some discomfort and pain. Other than that things seem to be going rather well. 01/01/2018 Seen today for follow-up and management of left lower extremity wound and lymphedema. T oday she presents with a new wound towards to the left lateral LE. Recently treated with a 7 day course of amoxicillin; reason for antibiotic dose is unknown at this time. Tolerating current treatment of collagen with 4- layer wraps. She obtained a venous reflux study on 12/25/17. Studies show on the right abnormal reflux times of the popliteal vein, great saphenous vein at the saphenofemoral junction at the  proximal thigh, great saphenous vein at the mid calf, and origin of the small saphenous vein.No superficial thrombosis. No deep vein thrombosis in the common femoral, femoral,and popliteal veins. Left abnormal reflex times as well of the common femoral vein, popliteal vein, and a great saphenous vein at the saphenofemoral junction, In great saphenous vein at the mid thigh w/o thrombosis. Has any issues or concerns during visit today. Recommended follow-up to vascular specialist due to abnormalities from the venous reflux study. Denies fever, pain, chills, dizziness, nausea, or vomiting. 01/08/18 upon evaluation today the patient actually seems to be showing some signs of improvement in my opinion at this point in regard to the lower extremity ulcerated areas. She still has a lot of drainage but fortunately nothing that appears to be too significant currently. I have been very happy with the overall progress I see today compared to where things were during the last evaluation that I had with her. Nonetheless she has not had her appointment with the vein specialist as of yet in fact we were able to get this approved for her today and confirmed with them she will be seeing them on December 26. Nonetheless in general I do feel like the compression wraps is doing well for her. 01/17/18; quite a bit of improvement since last time I saw this patient she has a small open area remaining on the left lateral calf and even smaller area medially. She has lymphedema chronic stasis changes with distal skin fibrosis. She has an appointment with vascular surgery later this month 01/24/2018; the patient's medial leg has closed. Still a small open area on the left lateral leg. She states that  the 4 layer compression we put on last week was too tight and she had to take it off over a few days ago. We have had resultant increase in her lymphedema in the dorsal foot and a proximal calf. Fortunately that does not seem to have  resulted in any deterioration in her wounds The patient is going to need compression stockings. We have given her measurements to phone elastic therapy in [place]. She has vascular surgery consult on December 26 02/07/18; she's had continuous contraction on the left lateral leg wound which is now very small. Currently using silver alginate under 3 layer compression She saw Dr. Trula Slade of vascular surgery on 02/06/18. It was noted that she had a normal reflux times in the popliteal vein, great saphenous vein at the saphenofemoral junction, great saphenous vein at the proximal thigh great saphenous vein at the mid calf and origin of the small saphenous vein. It was noted that she had significant reflux in the left saphenous veins with diameter measurements in the 0.7-0.8 cm range. It was felt she would benefit from laser ablation to help minimize the risk of ulcer recurrence. It was recommended that she wear 20-30 thigh-high compression stockings and have follow-up in 4-6 weeks 02/17/2018; I thought this lady would be healed however her compression slipped down and she developed increasing swelling and the wound is actually larger. 1/13; we had deterioration last week after the patient's compression slipped down and she developed periwound swelling. I increased her compression before layers we have been using silver alginate we are a lot better again today. She has her compression stockings in waiting 1/23; the patient's wounds are totally healed today. She has her stockings. This was almost circumferential skin damage. She follows up with Dr. Trula Slade of vascular surgery next Monday. READMISSION 02/21/2021 This is a now 66 year old woman that we had in clinic here discharging in January 2020 with wounds on her left calf chronic venous insufficiency. She was discharged with 30/40 stockings. It does not sound like she has worn stockings in about a year largely from not being able to get them on herself.  In November she developed new blisters on her legs an area laterally is opened into a fairly sizable wound. She has weeping posteriorly as well. She has been using Neosporin and Band-Aids. The patient did see Dr. Trula Slade in 2019 and 2020. He felt she might benefit from laser ablation in her left saphenous veins although because of the wound that healed I do not think he went through with it. She might benefit from seeing him again. Her ABI on the left is 1. 1/17; patient's wound on the posterior left calf is closed she has the 2 large areas last week. We put her in 4-layer compression for edema control is a lot better. Our intake nurse noted greenish drainage and odor. We have been using silver alginate 1/25; PCR culture I did have the substantial wound area on the left lateral lower leg showed staff aureus and group A strep. Low titers of coag negative staph which are probably skin contaminants. Resistance detected to tetracycline methicillin and macrolides. I gave her a starter kit of Nuzyra 150 mg x 3 for 2 days then 300 mg for a further 7 days. Marked odor considerable increase in surrounding erythema. We are using Iodoflex last week however I have changed to silver alginate with underlying Bactroban 2/1; she is completing her [place] tomorrow. The degree of erythema around the wounds looks a lot better. Odor  has improved. I gave her silver alginate and Bactroban last week and changing her back to Iodoflex to continue with ongoing debridement 2/8; the periwound looks a lot better. Surface of the wound also looks somewhat better although there is still ongoing debridement to be done we have been using Iodoflex under compression. Primary dressing and silver alginate 2/15; left posterior calf. Some improvement in the surface of the wound but still very gritty we have been using Iodoflex under compression. 04/05/2021: Left lateral posterior calf. She continues to have significant amounts of drainage,  some of which is probably comprised of the Iodoflex microbleeds. There is no odor to the drainage. The satellite lesion on the more posterior aspect of the calf is epithelializing nicely and has contracted quite a bit. There is robust granulation tissue in the dominant wound with minimal adherent slough. 04/12/2021: The satellite lesion has nearly closed. She continues to have good granulation tissue with minimal slough of the larger primary wound. She continues to have a fair amount of drainage, but I think this is less secondary to switching her dressing from Iodoflex to Prisma. 04/19/2021: The satellite lesion is almost completely epithelialized. The larger primary wound continues to contract with good granulation tissue and minimal slough. She is currently in [place] with compression. 04/26/2021: The satellite lesion has closed. The larger primary wound has contracted further and the granulation tissue is robust without being hypertrophic. Minimal slough is present. 05/03/2021: The satellite lesion remains closed. The larger primary wound has a bit of slough, but has also contracted further with good perimeter epithelialization. Good granulation tissue at the wound surface. There was some greenish drainage on the dressing when it was removed; it is not the blue-green typically associated with Pseudomonas aeruginosa, however. 05/10/2021: The primary wound continues to contract. There is minimal slough. The granulation tissue at 9:00 is a bit hypertrophic. No significant drainage. No odor. 05/17/2021: The wound is a little bit smaller today with good granulation tissue. Minimal slough. No significant drainage or odor. 05/24/2021: The wound continues to contract and has a nice base of granulation tissue. Small amount of slough. No concern for infection. 05/31/2021: For some reason, the wound measured slightly larger today but overall it still appears to be in good condition with a nice base of granulation  tissue and minimal slough. 06/07/2021: The wound is smaller today. Good granulation tissue and minimal slough. 06/14/2021: The wound is unchanged in size. She continues to accumulate some slough. Good granulation tissue on the surface. 06/20/2021: The wound is smaller today. Minimal slough with good granulation tissue. 06/27/2021: The wound on her left lateral leg is smaller today with just a bit of slough and eschar accumulation. Unfortunately, she has opened a new superficial wound on her right lower extremity. She has 3+ pitting edema to the knees on that leg and she does not wear compression stockings. 07/04/2021: In addition to the superficial wound on her right lower extremity that she opened up last week, she has opened 3 additional sites on the same leg. Her compression wraps were clearly not in correct position when she came to clinic today as they were at about the mid calf level rather than to the tibial tuberosity. She says that they slipped earlier and she had to come back on Friday to have them redone. The wound on her left lateral leg is perhaps slightly larger with some slough accumulation. 07/11/2021: The superficial wounds on her right lower extremity are nearly closed. They just have a thin  layer of eschar overlying them. The wound on her left lateral leg is a little bit shallower and has a bit of slough accumulation. 07/17/2021: The right medial lower extremity leg wounds have almost completely closed; one of them just has a tiny opening with a little bit of serous drainage. The left lateral leg wound is really unchanged. It seems to be stalled. 07/24/2021: The right leg wounds are completely closed. The left lateral leg wound is actually bigger today. It is a bit more tender. There is a minor accumulation of slough on the surface. 07/31/2021: The culture that I took last week was positive for MRSA. We added mupirocin under the silver alginate and I prescribed doxycycline. She has  been tolerating this well. The wound is smaller today but does have slough accumulation. No significant pain or drainage. 08/07/2021: She completed her course of doxycycline. The wound looks much better today. It is smaller and the periwound is less inflamed. She does have some slough accumulation on the surface. 08/14/2021: The wound continues to contract. There is slough on the wound surface, but the periwound is intact without inflammation or induration. 08/21/2021: The wound is down to just 2 small open sites. They both have a bit of slough accumulation. 08/28/2021: The wound is down to just 1 small open site with a little bit of slough and eschar accumulation. 09/04/2021: The wound continues to contract but remains open. It is clean without any slough. 09/12/2021: No real change in the overall wound dimensions, but it is flush with the surrounding skin. There is a little bit of slough accumulation. Edema control is good. 09/22/2021: The wound is smaller and even more superficial. Minimal slough accumulation. Good control of edema. 09/29/2021: The wound continues to contract. She reports that it was a little bit "twingey" during the week. It is a little bit dry on inspection today. Light accumulation of slough. Good edema control. 10/06/2021: The wound is about the same size today. There is a little slough on the surface. Edema control is good. Patient History Information obtained from Patient. Family History Heart Disease - Mother,Father, Hypertension - Mother,Father, Kidney Disease - Mother, Thyroid Problems - Mother, No family history of Cancer, Diabetes, Hereditary Spherocytosis, Lung Disease, Seizures, Stroke, Tuberculosis. Social History Never smoker, Marital Status - Single, Alcohol Use - Never, Drug Use - No History, Caffeine Use - Daily - coffee. Medical History Eyes Patient has history of Cataracts Hematologic/Lymphatic Patient has history of Lymphedema Cardiovascular Patient has  history of Hypertension, Peripheral Venous Disease Integumentary (Skin) Denies history of History of Burn Musculoskeletal Patient has history of Osteoarthritis Hospitalization/Surgery History - colostomy reversal. 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 Medical A Surgical History Notes nd Constitutional Symptoms (General Health) morbid obesity Cardiovascular schamberg disease, hyperlipidemia Gastrointestinal diverticulitis , h/o obstruction due to diverticulitis , colostomy and colostomy reversal Endocrine hypothyroidism Objective Constitutional No acute distress.. Vitals Time Taken: 1:35 PM, Height: 62 in, Weight: 226 lbs, BMI: 41.3, Temperature: 97.9 F, Pulse: 83 bpm, Respiratory Rate: 18 breaths/min, Blood Pressure: 114/73 mmHg. Respiratory Normal work of breathing on room air.. General Notes: 10/06/2021: The wound is about the same size today. There is a little slough on the surface. Edema control is good. Integumentary (Hair, Skin) Wound #3 status is Open. Original cause of wound was Blister. The date acquired was: 12/13/2020. The wound has been in treatment 32 weeks. The wound is located on the Left,Lateral Lower Leg. The wound measures 0.9cm length x 1cm width x 0.1cm depth; 0.707cm^2 area and  0.071cm^3 volume. There is Fat Layer (Subcutaneous Tissue) exposed. There is no tunneling or undermining noted. There is a medium amount of serosanguineous drainage noted. The wound margin is distinct with the outline attached to the wound base. There is medium (34-66%) pink granulation within the wound bed. There is a medium (34-66%) amount of necrotic tissue within the wound bed including Adherent Slough. Assessment Active Problems ICD-10 Chronic venous hypertension (idiopathic) with ulcer and inflammation of left lower extremity Non-pressure chronic ulcer of other part of left lower leg with other specified severity Procedures Wound #3 Pre-procedure diagnosis of Wound #3 is a Venous  Leg Ulcer located on the Left,Lateral Lower Leg .Severity of Tissue Pre Debridement is: Fat layer exposed. There was a Selective/Open Wound Non-Viable Tissue Debridement with a total area of 0.9 sq cm performed by [name], MD. With the following instrument(s): Curette to remove Non-Viable tissue/material. Material removed includes [facility]. No specimens were taken. A time out was conducted at 13:55, prior to the start of the procedure. A Minimum amount of bleeding was controlled with Pressure. The procedure was tolerated well with a pain level of 0 throughout and a pain level of 0 following the procedure. Post Debridement Measurements: 0.9cm length x 1cm width x 0.1cm depth; 0.071cm^3 volume. Character of Wound/Ulcer Post Debridement is improved. Severity of Tissue Post Debridement is: Fat layer exposed. Post procedure Diagnosis Wound #3: Same as Pre-Procedure Plan Follow-up Appointments: Return Appointment in 1 week. - Dr. Celine Ahr Room 3 - Friday 10/13/21 at 12:30pm Bathing/ Shower/ Hygiene: May shower with protection but do not get wound dressing(s) wet. - Ok to use [employer], can purchase at [facility], [facility], or [employer] Edema Control - Lymphedema / SCD / Other: Elevate legs to the level of the heart or above for 30 minutes daily and/or when sitting, a frequency of: - throughout the day Avoid standing for long periods of time. Exercise regularly Compression stocking or Garment 20-30 mm/Hg pressure to: - Brochure for Elastic Therapy. Leg measurements included WOUND #3: - Lower Leg Wound Laterality: Left, Lateral Cleanser: Soap and Water 1 x Per Week/30 Days Discharge Instructions: May shower and wash wound with dial antibacterial soap and water prior to dressing change. Cleanser: Wound Cleanser 1 x Per Week/30 Days Discharge Instructions: Cleanse the wound with wound cleanser prior to applying a clean dressing using gauze sponges, not tissue or cotton balls. Peri-Wound Care: Sween Lotion  (Moisturizing lotion) 1 x Per Week/30 Days Discharge Instructions: Apply moisturizing lotion as directed Prim Dressing: Promogran Prisma Matrix, 4.34 (sq in) (silver collagen) 1 x Per Week/30 Days ary Discharge Instructions: Moisten collagen with saline or hydrogel Secondary Dressing: Zetuvit Plus 4x8 in 1 x Per Week/30 Days Discharge Instructions: Apply over primary dressing as directed. Com pression Wrap: CoFlex TLC XL 2-layer Compression System 4x7 (in/yd) 1 x Per Week/30 Days Discharge Instructions: Apply CoFlex 2-layer compression as directed. (alt for 4 layer) 10/06/2021: The wound is about the same size today. There is a little slough on the surface. Edema control is good. I used a curette to debride the slough from her wound. We are a little bit stagnant at this point and I am going to change her to endoform to see if we can move things forward again. Continue Coflex compression. Follow-up in 1 week. Electronic Signature(s) Signed: 10/06/2021 2:05:41 PM By: [name] MD FACS Entered By: [name] on 10/06/2021 14:05:41 -------------------------------------------------------------------------------- HxROS Details Patient Name: Date of Service: Erin Gerold [name] 10/06/2021 1:15 PM Medical Record  Number: [id number] Patient Account Number: [id number] Date of Birth/Sex: Treating RN: 04/04/1955 (66 y.o. [name] Primary Care Provider: Other Clinician: [name] Referring Provider: Treating Provider/Extender: [name] in Treatment: 20 Information Obtained From Patient Constitutional Symptoms (General Health) Medical History: Past Medical History Notes: morbid obesity Eyes Medical History: Positive for: Cataracts Hematologic/Lymphatic Medical History: Positive for: Lymphedema Cardiovascular Medical History: Positive for: Hypertension; Peripheral Venous Disease Past Medical History Notes: schamberg disease,  hyperlipidemia Gastrointestinal Medical History: Past Medical History Notes: diverticulitis , h/o obstruction due to diverticulitis , colostomy and colostomy reversal Endocrine Medical History: Past Medical History Notes: hypothyroidism Integumentary (Skin) Medical History: Negative for: History of Burn Musculoskeletal Medical History: Positive for: Osteoarthritis HBO Extended History Items Eyes: Cataracts Immunizations Pneumococcal Vaccine: Received Pneumococcal Vaccination: Yes Received Pneumococcal Vaccination On or After 60th Birthday: Yes Implantable Devices None Hospitalization / Surgery History Type of Hospitalization/Surgery colostomy reversal 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 Family and Social History Cancer: No; Diabetes: No; Heart Disease: Yes - Mother,Father; Hereditary Spherocytosis: No; Hypertension: Yes - Mother,Father; Kidney Disease: Yes - Mother; Lung Disease: No; Seizures: No; Stroke: No; Thyroid Problems: Yes - Mother; Tuberculosis: No; Never smoker; Marital Status - Single; Alcohol Use: Never; Drug Use: No History; Caffeine Use: Daily - coffee; Financial Concerns: No; Food, Clothing or Shelter Needs: No; Support System Lacking: No; Transportation Concerns: No Electronic Signature(s) Signed: 10/06/2021 4:55:05 PM By: [name] MD FACS Signed: 10/06/2021 6:25:09 PM By: [name] RN Entered By: [name] on 10/06/2021 14:04:24 -------------------------------------------------------------------------------- SuperBill Details Patient Name: Date of Service: Erin Face D. 10/06/2021 Medical Record Number: [id number] Patient Account Number: [id number] Date of Birth/Sex: Treating RN: 1955/03/09 (66 y.o. [name] Primary Care Provider: [name] Other Clinician: Referring Provider: Treating Provider/Extender: [name] in Treatment: 32 Diagnosis Coding ICD-10 Codes Code Description [phone]  Chronic venous hypertension (idiopathic) with ulcer and inflammation of left lower extremity L97.828 Non-pressure chronic ulcer of other part of left lower leg with other specified severity Facility Procedures CPT4 Code: [id number] Description: [phone] - DEBRIDE WOUND 1ST 20 SQ CM OR < ICD-10 Diagnosis Description L97.828 Non-pressure chronic ulcer of other part of left lower leg with other specified se Modifier: verity Quantity: 1 Physician Procedures : CPT4 Code Description Modifier [id number] - WC PHYS LEVEL 3 - EST PT 25 ICD-10 Diagnosis Description L97.828 Non-pressure chronic ulcer of other part of left lower leg with other specified severity 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 (idiopathic)  with ulcer and inflammation of left lower extremity Quantity: 1 : [id number] - WC PHYS DEBR WO ANESTH 20 SQ CM ICD-10 Diagnosis Description L97.828 Non-pressure chronic ulcer of other part of left lower leg with other specified severity Quantity: 1 Electronic Signature(s) Signed: 10/06/2021 2:06:00 PM By: [name] MD FACS Entered By: [name] on 10/06/2021 14:06:00

## 2021-10-06 NOTE — Progress Notes (Signed)
NYOMI, HOWSER ([id number]) Visit Report for 10/06/2021 Arrival Information Details Patient Name: Date of Service: LAYNEE, LOCKAMY 10/06/2021 1:15 PM Medical Record Number: [id number] Patient Account Number: [id number] Date of Birth/Sex: Treating RN: Sep 28, 1955 (66 y.o. [name] Primary Care Brailyn Delman: [name] Other Clinician: Referring Yuniel Blaney: Treating Kalijah Zeiss/Extender: [name] in Treatment: 2 Visit Information History Since Last Visit Added or deleted any medications: No Patient Arrived: Cane Any new allergies or adverse reactions: No Arrival Time: 13:37 Had a fall or experienced change in No Accompanied By: self activities of daily living that may affect Transfer Assistance: None risk of falls: Patient Identification Verified: Yes Signs or symptoms of abuse/neglect since last visito No Patient Requires Transmission-Based Precautions: No Hospitalized since last visit: No Patient Has Alerts: No Implantable device outside of the clinic excluding No cellular tissue based products placed in the center since last visit: Has Dressing in Place as Prescribed: Yes Pain Present Now: No Electronic Signature(s) Signed: 10/06/2021 6:25:09 PM By: [name] RN Entered By: [name] on 10/06/2021 13:38:40 -------------------------------------------------------------------------------- Encounter Discharge Information Details Patient Name: Date of Service: [name] 10/06/2021 1:15 PM Medical Record Number: [id number] Patient Account Number: [id number] Date of Birth/Sex: Treating RN: August 22, 1955 (66 y.o. [name] Primary Care Terrian Sentell: [name] Other Clinician: Referring Chanae Gemma: Treating Marjie Chea/Extender: [name] in Treatment: [phone] Encounter Discharge Information Items Post Procedure Vitals Discharge Condition: Stable Temperature (F): 97.9 Ambulatory Status:  Cane Pulse (bpm): 83 Discharge Destination: Home Respiratory Rate (breaths/min): 18 Transportation: Private Auto Blood Pressure (mmHg): 114/73 Accompanied By: self Schedule Follow-up Appointment: Yes Clinical Summary of Care: Patient Declined Electronic Signature(s) Signed: 10/06/2021 6:25:09 PM By: [name] RN Entered By: [name] on 10/06/2021 17:16:16 -------------------------------------------------------------------------------- Lower Extremity Assessment Details Patient Name: Date of Service: CHIANA, WAMSER 10/06/2021 1:15 PM Medical Record Number: [id number] Patient Account Number: [id number] Date of Birth/Sex: Treating RN: 10-16-1955 (66 y.o. [name] Primary Care Aamirah Salmi: [name] Other Clinician: Referring Ranvir Renovato: Treating Eliya Geiman/Extender: [name] in Treatment: 32 Edema Assessment Assessed: [Left: No] [Right: No] Edema: [Left: Ye] [Right: s] Calf Left: Right: Point of Measurement: 29 cm From Medial Instep 43 cm Ankle Left: Right: Point of Measurement: 10 cm From Medial Instep 24.1 cm Electronic Signature(s) Signed: 10/06/2021 6:25:09 PM By: [name] RN Entered By: [name] on 10/06/2021 13:39:48 -------------------------------------------------------------------------------- Multi Wound Chart Details Patient Name: Date of Service: [name] 10/06/2021 1:15 PM Medical Record Number: [id number] Patient Account Number: [id number] Date of Birth/Sex: Treating RN: 11/25/1955 (66 y.o. [name] Primary Care Torey Reinard: [name] Other Clinician: Referring Vickye Astorino: Treating Shawon Denzer/Extender: [name] in Treatment: 32 Vital Signs Height(in): 62 Pulse(bpm): 83 Weight(lbs): 226 Blood Pressure(mmHg): 114/73 Body Mass Index(BMI): 41.3 Temperature(F): 97.9 Respiratory Rate(breaths/min): 18 Photos: [3:No Photos Left, Lateral Lower  Leg] [N/A:N/A N/A] Wound Location: [3:Blister] [N/A:N/A] Wounding Event: [3:Venous Leg Ulcer] [N/A:N/A] Primary Etiology: [3:Cataracts, Lymphedema,] [N/A:N/A] Comorbid History: [3:Hypertension, Peripheral Venous Disease, Osteoarthritis 12/13/2020] [N/A:N/A] Date Acquired: [3:32] [N/A:N/A] Weeks of Treatment: [3:Open] [N/A:N/A] Wound Status: [3:No] [N/A:N/A] Wound Recurrence: [3:0.9x1x0.1] [N/A:N/A] Measurements L x W x D (cm) [3:0.707] [N/A:N/A] A (cm) : rea [3:0.071] [N/A:N/A] Volume (cm) : [3:97.60%] [N/A:N/A] % Reduction in Area: [3:98.80%] [N/A:N/A] % Reduction in Volume: [3:Full Thickness Without Exposed] [N/A:N/A] Classification: [3:Support Structures Medium] [N/A:N/A] Exudate A mount: [3:Serosanguineous] [N/A:N/A] Exudate Type: [3:red, brown] [N/A:N/A] Exudate Color: [3:Distinct, outline attached] [N/A:N/A] Wound Margin: [3:Medium (34-66%)] [N/A:N/A]  Granulation A mount: [3:Pink] [N/A:N/A] Granulation Quality: [3:Medium (34-66%)] [N/A:N/A] Necrotic A mount: [3:Fat Layer (Subcutaneous Tissue): Yes N/A] Exposed Structures: [3:Fascia: No Tendon: No Muscle: No Joint: No Bone: No Medium (34-66%)] [N/A:N/A] Epithelialization: [3:Debridement - Selective/Open Wound N/A] Debridement: Pre-procedure Verification/Time Out 13:55 [N/A:N/A] Taken: [3:Slough] [N/A:N/A] Tissue Debrided: [3:Non-Viable Tissue] [N/A:N/A] Level: [3:0.9] [N/A:N/A] Debridement A (sq cm): [3:rea Curette] [N/A:N/A] Instrument: [3:Minimum] [N/A:N/A] Bleeding: [3:Pressure] [N/A:N/A] Hemostasis A chieved: [3:0] [N/A:N/A] Procedural Pain: [3:0] [N/A:N/A] Post Procedural Pain: [3:Procedure was tolerated well] [N/A:N/A] Debridement Treatment Response: [3:0.9x1x0.1] [N/A:N/A] Post Debridement Measurements L x W x D (cm) [3:0.071] [N/A:N/A] Post Debridement Volume: (cm) [3:Debridement] [N/A:N/A] Treatment Notes Electronic Signature(s) Signed: 10/06/2021 2:03:38 PM By: [name] MD FACS Signed: 10/06/2021  6:25:09 PM By: [name] RN Entered By: [name] on 10/06/2021 14:03:38 -------------------------------------------------------------------------------- Multi-Disciplinary Care Plan Details Patient Name: Date of Service: [name] 10/06/2021 1:15 PM Medical Record Number: [id number] Patient Account Number: [id number] Date of Birth/Sex: Treating RN: [birth year] (66 y.o. [name] Primary Care Kunio Cummiskey: [name] Other Clinician: Referring Malgorzata Albert: Treating Melinda Gwinner/Extender: [name] in Treatment: 4 Multidisciplinary Care Plan reviewed with physician Active Inactive Venous Leg Ulcer Nursing Diagnoses: Actual venous Insuffiency (use after diagnosis is confirmed) Goals: Patient will maintain optimal edema control Date Initiated: 02/21/2021 Target Resolution Date: 12/12/2021 Goal Status: Active Patient/caregiver will verbalize understanding of disease process and disease management Date Initiated: 02/21/2021 Date Inactivated: 03/29/2021 Target Resolution Date: 03/24/2021 Goal Status: Met Interventions: Assess peripheral edema status every visit. Compression as ordered Provide education on venous insufficiency Notes: Wound/Skin Impairment Nursing Diagnoses: Impaired tissue integrity Knowledge deficit related to ulceration/compromised skin integrity Goals: Patient/caregiver will verbalize understanding of skin care regimen Date Initiated: 02/21/2021 Target Resolution Date: 12/12/2021 Goal Status: Active Ulcer/skin breakdown will have a volume reduction of 30% by week 4 Date Initiated: 02/21/2021 Date Inactivated: 03/29/2021 Target Resolution Date: 03/24/2021 Goal Status: Unmet Unmet Reason: infection Ulcer/skin breakdown will have a volume reduction of 50% by week 8 Date Initiated: 03/29/2021 Date Inactivated: 04/19/2021 Target Resolution Date: 04/21/2021 Goal Status: Met Interventions: Assess patient/caregiver  ability to obtain necessary supplies Assess patient/caregiver ability to perform ulcer/skin care regimen upon admission and as needed Assess ulceration(s) every visit Provide education on ulcer and skin care Notes: Electronic Signature(s) Signed: 10/06/2021 6:25:09 PM By: [name] RN Entered By: [name] on 10/06/2021 17:14:29 -------------------------------------------------------------------------------- Pain Assessment Details Patient Name: Date of Service: KELLEEN, STOLZE 10/06/2021 1:15 PM Medical Record Number: [id number] Patient Account Number: [id number] Date of Birth/Sex: Treating RN: 09/07/1955 (66 y.o. [name] Primary Care Jalon Squier: [name] Other Clinician: Referring Zyriah Mask: Treating Wes Lezotte/Extender: [name] in Treatment: 32 Active Problems Location of Pain Severity and Description of Pain Patient Has Paino No Site Locations Pain Management and Medication Current Pain Management: Electronic Signature(s) Signed: 10/06/2021 6:25:09 PM By: [name] RN Entered By: [name] on 10/06/2021 13:39:26 -------------------------------------------------------------------------------- Patient/Caregiver Education Details Patient Name: Date of Service: [name] 8/25/2023andnbsp1:15 PM Medical Record Number: [id number] Patient Account Number: [id number] Date of Birth/Gender: Treating RN: [birth year] (66 y.o. [name] Primary Care Physician: [name] Other Clinician: Referring Physician: Treating Physician/Extender: [name] in Treatment: 63 Education Assessment Education Provided To: Patient Education Topics Provided Wound/Skin Impairment: Methods: Explain/Verbal Responses: Return demonstration correctly Electronic Signature(s) Signed: 10/06/2021 6:25:09 PM By: [name] RN Entered By: [name] on 10/06/2021  17:14:41 -------------------------------------------------------------------------------- Wound Assessment Details Patient Name: Date of Service: [name]  [name] 10/06/2021 1:15 PM Medical Record Number: [id number] Patient Account Number: [id number] Date of Birth/Sex: Treating RN: Oct 22, 1955 (66 y.o. [name] Primary Care Shiva Karis: [name] Other Clinician: Referring Manasi Dishon: Treating Jabreel Chimento/Extender: [name] in Treatment: 32 Wound Status Wound Number: 3 Primary Venous Leg Ulcer Etiology: Wound Location: Left, Lateral Lower Leg Wound Open Wounding Event: Blister Status: Date Acquired: 12/13/2020 Comorbid Cataracts, Lymphedema, Hypertension, Peripheral Venous Weeks Of Treatment: 32 History: Disease, Osteoarthritis Clustered Wound: No Wound Measurements Length: (cm) 0.9 Width: (cm) 1 Depth: (cm) 0.1 Area: (cm) 0.707 Volume: (cm) 0.071 % Reduction in Area: 97.6% % Reduction in Volume: 98.8% Epithelialization: Medium (34-66%) Tunneling: No Undermining: No Wound Description Classification: Full Thickness Without Exposed Support Structures Wound Margin: Distinct, outline attached Exudate Amount: Medium Exudate Type: Serosanguineous Exudate Color: red, brown Foul Odor After Cleansing: No Slough/Fibrino Yes Wound Bed Granulation Amount: Medium (34-66%) Exposed Structure Granulation Quality: Pink Fascia Exposed: No Necrotic Amount: Medium (34-66%) Fat Layer (Subcutaneous Tissue) Exposed: Yes Necrotic Quality: Adherent Slough Tendon Exposed: No Muscle Exposed: No Joint Exposed: No Bone Exposed: No Treatment Notes Wound #3 (Lower Leg) Wound Laterality: Left, Lateral Cleanser Soap and Water Discharge Instruction: May shower and wash wound with dial antibacterial soap and water prior to dressing change. Wound Cleanser Discharge Instruction: Cleanse the wound with wound cleanser prior to applying a clean dressing  using gauze sponges, not tissue or cotton balls. Peri-Wound Care Sween Lotion (Moisturizing lotion) Discharge Instruction: Apply moisturizing lotion as directed Topical Primary Dressing Promogran Prisma Matrix, 4.34 (sq in) (silver collagen) Discharge Instruction: Moisten collagen with saline or hydrogel Secondary Dressing Zetuvit Plus 4x8 in Discharge Instruction: Apply over primary dressing as directed. Secured With Compression Wrap CoFlex TLC XL 2-layer Compression System 4x7 (in/yd) Discharge Instruction: Apply CoFlex 2-layer compression as directed. (alt for 4 layer) Compression Stockings Add-Ons Electronic Signature(s) Signed: 10/06/2021 6:25:09 PM By: [name] RN Entered By: [name] on 10/06/2021 13:52:21 -------------------------------------------------------------------------------- Vitals Details Patient Name: Date of Service: [name] 10/06/2021 1:15 PM Medical Record Number: [id number] Patient Account Number: [id number] Date of Birth/Sex: Treating RN: [birth year] (66 y.o. [name] Primary Care Johnel Yielding: [name] Other Clinician: Referring Kaled Allende: Treating Tiani Stanbery/Extender: [name] in Treatment: 32 Vital Signs Time Taken: 13:35 Temperature (F): 97.9 Height (in): 62 Pulse (bpm): 83 Weight (lbs): 226 Respiratory Rate (breaths/min): 18 Body Mass Index (BMI): 41.3 Blood Pressure (mmHg): 114/73 Reference Range: 80 - 120 mg / dl Electronic Signature(s) Signed: 10/06/2021 6:25:09 PM By: [name] RN Entered By: [name] on 10/06/2021 13:39:21

## 2021-10-13 ENCOUNTER — Encounter (HOSPITAL_BASED_OUTPATIENT_CLINIC_OR_DEPARTMENT_OTHER): Payer: 59 | Attending: General Surgery | Admitting: General Surgery

## 2021-10-13 DIAGNOSIS — I739 Peripheral vascular disease, unspecified: Secondary | ICD-10-CM | POA: Insufficient documentation

## 2021-10-13 DIAGNOSIS — M1990 Unspecified osteoarthritis, unspecified site: Secondary | ICD-10-CM | POA: Diagnosis not present

## 2021-10-13 DIAGNOSIS — L97828 Non-pressure chronic ulcer of other part of left lower leg with other specified severity: Secondary | ICD-10-CM | POA: Diagnosis not present

## 2021-10-13 DIAGNOSIS — I872 Venous insufficiency (chronic) (peripheral): Secondary | ICD-10-CM | POA: Insufficient documentation

## 2021-10-13 DIAGNOSIS — W19XXXA Unspecified fall, initial encounter: Secondary | ICD-10-CM | POA: Diagnosis not present

## 2021-10-13 DIAGNOSIS — E039 Hypothyroidism, unspecified: Secondary | ICD-10-CM | POA: Insufficient documentation

## 2021-10-13 DIAGNOSIS — I87332 Chronic venous hypertension (idiopathic) with ulcer and inflammation of left lower extremity: Secondary | ICD-10-CM | POA: Diagnosis not present

## 2021-10-13 DIAGNOSIS — I890 Lymphedema, not elsewhere classified: Secondary | ICD-10-CM | POA: Insufficient documentation

## 2021-10-13 DIAGNOSIS — L97822 Non-pressure chronic ulcer of other part of left lower leg with fat layer exposed: Secondary | ICD-10-CM | POA: Diagnosis not present

## 2021-10-13 DIAGNOSIS — I10 Essential (primary) hypertension: Secondary | ICD-10-CM | POA: Insufficient documentation

## 2021-10-13 NOTE — Progress Notes (Signed)
KATLYN, MULDREW ([id number]) Visit Report for 10/13/2021 Problem List Details Patient Name: Date of Service: [name], [name] 10/13/2021 12:30 PM Medical Record Number: [id number] Patient Account Number: [id number] Date of Birth/Sex: Treating RN: 11-27-1955 (66 y.o. 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

## 2021-10-17 NOTE — Progress Notes (Signed)
[name], [name] ([id number]) Visit Report for 10/13/2021 Arrival Information Details Patient Name: Date of Service: [name], [name] 10/13/2021 12:30 PM Medical Record Number: [id number] Patient Account Number: [id number] Date of Birth/Sex: Treating RN: [birth year] (66 y.o. [name] Primary Care Bettyanne Dittman: [name] Other Clinician: Referring Devina Bezold: Treating Mariea Mcmartin/Extender: [name] in Treatment: 33 Visit Information History Since Last Visit Added or deleted any medications: No Patient Arrived: Cane Any new allergies or adverse reactions: No Arrival Time: 12:41 Had a fall or experienced change in No Accompanied By: self activities of daily living that may affect Transfer Assistance: None risk of falls: Patient Identification Verified: Yes Signs or symptoms of abuse/neglect since last visito No Patient Requires Transmission-Based Precautions: No Hospitalized since last visit: No Patient Has Alerts: No Implantable device outside of the clinic excluding No cellular tissue based products placed in the center since last visit: Has Dressing in Place as Prescribed: Yes Has Compression in Place as Prescribed: Yes Pain Present Now: No Electronic Signature(s) Signed: 10/17/2021 12:07:49 PM By: [name] RN Entered By: [name] on 10/13/2021 12:41:31 -------------------------------------------------------------------------------- Compression Therapy Details Patient Name: Date of Service: Erin Face D. 10/13/2021 12:30 PM Medical Record Number: [id number] Patient Account Number: [id number] Date of Birth/Sex: Treating RN: 11/24/1955 (66 y.o. [name] Primary Care Rosemary Pentecost: [name] Other Clinician: Referring Abdulrahman Bracey: Treating Brance Dartt/Extender: [name] in Treatment: 33 Compression Therapy Performed for Wound Assessment: Wound #3 Left,Lateral Lower Leg Performed By: Clinician  [name], RN Compression Type: Double Layer Post Procedure Diagnosis Same as Pre-procedure Electronic Signature(s) Signed: 10/17/2021 12:07:49 PM By: [name] RN Entered By: [name] on 10/13/2021 13:07:01 -------------------------------------------------------------------------------- Lower Extremity Assessment Details Patient Name: Date of Service: [name], [name] 10/13/2021 12:30 PM Medical Record Number: [id number] Patient Account Number: [id number] Date of Birth/Sex: Treating RN: 02/19/1955 (66 y.o. [name] Primary Care Ajani Rineer: [name] Other Clinician: Referring Charell Faulk: Treating Gwenda Heiner/Extender: [name] in Treatment: 33 Edema Assessment Assessed: [Left: No] [Right: No] Edema: [Left: Ye] [Right: s] Calf Left: Right: Point of Measurement: 29 cm From Medial Instep 43 cm Ankle Left: Right: Point of Measurement: 10 cm From Medial Instep 24.1 cm Electronic Signature(s) Signed: 10/17/2021 12:07:49 PM By: [name] RN Entered By: [name] on 10/13/2021 12:46:12 -------------------------------------------------------------------------------- Multi Wound Chart Details Patient Name: Date of Service: Erin Face D. 10/13/2021 12:30 PM Medical Record Number: [id number] Patient Account Number: [id number] Date of Birth/Sex: Treating RN: 1955-06-08 (66 y.o. [name] Primary Care Kashara Blocher: [name] Other Clinician: Referring Durante Violett: Treating Shylie Polo/Extender: [name] in Treatment: 33 Vital Signs Height(in): 30 Pulse(bpm): 57 Weight(lbs): 77 Blood Pressure(mmHg): 111/69 Body Mass Index(BMI): 41.3 Temperature(F): 97.9 Respiratory Rate(breaths/min): 16 Photos: [N/A:N/A] Left, Lateral Lower Leg N/A N/A Wound Location: Blister N/A N/A Wounding Event: Venous Leg Ulcer N/A N/A Primary Etiology: Cataracts, Lymphedema, N/A N/A Comorbid  History: Hypertension, Peripheral Venous Disease, Osteoarthritis 12/13/2020 N/A N/A Date Acquired: 78 N/A N/A Weeks of Treatment: Open N/A N/A Wound Status: No N/A N/A Wound Recurrence: 0.6x0.4x0.1 N/A N/A Measurements L x W x D (cm) 0.188 N/A N/A A (cm) : rea 0.019 N/A N/A Volume (cm) : 99.40% N/A N/A % Reduction in Area: 99.70% N/A N/A % Reduction in Volume: Full Thickness Without Exposed N/A N/A Classification: Support Structures Medium N/A N/A Exudate Amount: Serosanguineous N/A N/A Exudate Type: red, [name] N/A N/A Exudate Color: Distinct, outline attached N/A N/A Wound Margin: Medium (  34-66%) N/A N/A Granulation Amount: Pink N/A N/A Granulation Quality: Medium (34-66%) N/A N/A Necrotic Amount: Fat Layer (Subcutaneous Tissue): Yes N/A N/A Exposed Structures: Fascia: No Tendon: No Muscle: No Joint: No Bone: No Medium (34-66%) N/A N/A Epithelialization: Treatment Notes Electronic Signature(s) Signed: 10/13/2021 1:05:53 PM By: [name] MD FACS Signed: 10/17/2021 12:07:49 PM By: [name] RN Entered By: [name] on 10/13/2021 13:05:53 -------------------------------------------------------------------------------- Pain Assessment Details Patient Name: Date of Service: Erin Face D. 10/13/2021 12:30 PM Medical Record Number: [id number] Patient Account Number: [id number] Date of Birth/Sex: Treating RN: [birth year] (66 y.o. [name] Primary Care Lisett Dirusso: [name] Other Clinician: Referring Hamish Banks: Treating Earnie Bechard/Extender: [name] in Treatment: 33 Active Problems Location of Pain Severity and Description of Pain Patient Has Paino No Site Locations Pain Management and Medication Current Pain Management: Electronic Signature(s) Signed: 10/17/2021 12:07:49 PM By: [name] RN Entered By: [name] on 10/13/2021  12:46:07 -------------------------------------------------------------------------------- Wound Assessment Details Patient Name: Date of Service: Erin Face D. 10/13/2021 12:30 PM Medical Record Number: [id number] Patient Account Number: [id number] Date of Birth/Sex: Treating RN: 12-Apr-1955 (66 y.o. [name] Primary Care Nathaniel Wakeley: [name] Other Clinician: Referring Rubyann Lingle: Treating Teruo Stilley/Extender: [name] in Treatment: 33 Wound Status Wound Number: 3 Primary Venous Leg Ulcer Etiology: Wound Location: Left, Lateral Lower Leg Wound Open Wounding Event: Blister Status: Date Acquired: 12/13/2020 Comorbid Cataracts, Lymphedema, Hypertension, Peripheral Venous Weeks Of Treatment: 33 History: Disease, Osteoarthritis Clustered Wound: No Photos Wound Measurements Length: (cm) 0.6 Width: (cm) 0.4 Depth: (cm) 0.1 Area: (cm) 0.188 Volume: (cm) 0.019 % Reduction in Area: 99.4% % Reduction in Volume: 99.7% Epithelialization: Medium (34-66%) Tunneling: No Undermining: No Wound Description Classification: Full Thickness Without Exposed Support Structures Wound Margin: Distinct, outline attached Exudate Amount: Medium Exudate Type: Serosanguineous Exudate Color: red, [name] Foul Odor After Cleansing: No Slough/Fibrino Yes Wound Bed Granulation Amount: Medium (34-66%) Exposed Structure Granulation Quality: Pink Fascia Exposed: No Necrotic Amount: Medium (34-66%) Fat Layer (Subcutaneous Tissue) Exposed: Yes Necrotic Quality: Adherent Slough Tendon Exposed: No Muscle Exposed: No Joint Exposed: No Bone Exposed: No Electronic Signature(s) Signed: 10/17/2021 12:07:49 PM By: [name] RN Entered By: [name] on 10/13/2021 12:58:32 -------------------------------------------------------------------------------- Vitals Details Patient Name: Date of Service: Erin Gerold [name] 10/13/2021 12:30 PM Medical Record  Number: [id number] Patient Account Number: [id number] Date of Birth/Sex: Treating RN: February 20, 1955 (66 y.o. [name] Primary Care Tomiko Schoon: [name] Other Clinician: Referring Kaedance Magos: Treating Devorah Givhan/Extender: [name] in Treatment: 33 Vital Signs Time Taken: 12:41 Temperature (F): 97.9 Height (in): 62 Pulse (bpm): 86 Weight (lbs): 226 Respiratory Rate (breaths/min): 16 Body Mass Index (BMI): 41.3 Blood Pressure (mmHg): 111/69 Reference Range: 80 - 120 mg / dl Electronic Signature(s) Signed: 10/17/2021 12:07:49 PM By: [name] RN Entered By: [name] on 10/13/2021 12:46:00

## 2021-10-23 ENCOUNTER — Ambulatory Visit: Payer: 59 | Admitting: Internal Medicine

## 2021-10-23 ENCOUNTER — Encounter (HOSPITAL_BASED_OUTPATIENT_CLINIC_OR_DEPARTMENT_OTHER): Payer: 59 | Admitting: General Surgery

## 2021-10-23 DIAGNOSIS — I872 Venous insufficiency (chronic) (peripheral): Secondary | ICD-10-CM | POA: Diagnosis not present

## 2021-10-23 DIAGNOSIS — L97828 Non-pressure chronic ulcer of other part of left lower leg with other specified severity: Secondary | ICD-10-CM | POA: Diagnosis not present

## 2021-10-23 DIAGNOSIS — I10 Essential (primary) hypertension: Secondary | ICD-10-CM | POA: Diagnosis not present

## 2021-10-23 DIAGNOSIS — I739 Peripheral vascular disease, unspecified: Secondary | ICD-10-CM | POA: Diagnosis not present

## 2021-10-23 DIAGNOSIS — I87332 Chronic venous hypertension (idiopathic) with ulcer and inflammation of left lower extremity: Secondary | ICD-10-CM | POA: Diagnosis not present

## 2021-10-23 DIAGNOSIS — E039 Hypothyroidism, unspecified: Secondary | ICD-10-CM | POA: Diagnosis not present

## 2021-10-23 DIAGNOSIS — I890 Lymphedema, not elsewhere classified: Secondary | ICD-10-CM | POA: Diagnosis not present

## 2021-10-23 DIAGNOSIS — L97822 Non-pressure chronic ulcer of other part of left lower leg with fat layer exposed: Secondary | ICD-10-CM | POA: Diagnosis not present

## 2021-10-23 DIAGNOSIS — M1990 Unspecified osteoarthritis, unspecified site: Secondary | ICD-10-CM | POA: Diagnosis not present

## 2021-10-23 NOTE — Progress Notes (Signed)
[name], Erin ([id number]) Visit Report for 10/23/2021 Arrival Information Details Patient Name: Date of Service: [name], VENN 10/23/2021 2:30 PM Medical Record Number: [id number] Patient Account Number: [id number] Date of Birth/Sex: Treating RN: 1955-04-23 (66 y.o. [name] Primary Care : [name] Other Clinician: Referring : Treating /Extender: [name] in Treatment: 38 Visit Information History Since Last Visit Added or deleted any medications: No Patient Arrived: Cane Any new allergies or adverse reactions: No Arrival Time: 14:38 Had a fall or experienced change in No Accompanied By: self activities of daily living that may affect Transfer Assistance: None risk of falls: Patient Identification Verified: Yes Signs or symptoms of abuse/neglect since last visito No Patient Requires Transmission-Based Precautions: No Hospitalized since last visit: No Patient Has Alerts: No Implantable device outside of the clinic excluding No cellular tissue based products placed in the center since last visit: Has Dressing in Place as Prescribed: Yes Pain Present Now: No Electronic Signature(s) Signed: 10/23/2021 5:34:43 PM By: [name] RN Entered By: [name] on 10/23/2021 14:40:42 -------------------------------------------------------------------------------- Compression Therapy Details Patient Name: Date of Service: Erin Face D. 10/23/2021 2:30 PM Medical Record Number: [id number] Patient Account Number: [id number] Date of Birth/Sex: Treating RN: [birth year] (66 y.o. [name] Primary Care : [name] Other Clinician: Referring : Treating /Extender: [name] in Treatment: 34 Compression Therapy Performed for Wound Assessment: Wound #3 Left,Lateral Lower Leg Performed By: Clinician [name], RN Compression Type:  Double Layer Post Procedure Diagnosis Same as Pre-procedure Electronic Signature(s) Signed: 10/23/2021 5:34:43 PM By: [name] RN Entered By: [name] on 10/23/2021 15:09:45 -------------------------------------------------------------------------------- Encounter Discharge Information Details Patient Name: Date of Service: Erin Face D. 10/23/2021 2:30 PM Medical Record Number: [id number] Patient Account Number: [id number] Date of Birth/Sex: Treating RN: [birth year] (66 y.o. [name] Primary Care : [name] Other Clinician: Referring : Treating /Extender: [name] in Treatment: 34 Encounter Discharge Information Items Post Procedure Vitals Discharge Condition: Stable Temperature (F): 98.1 Ambulatory Status: Cane Pulse (bpm): 78 Discharge Destination: Home Respiratory Rate (breaths/min): 16 Transportation: Private Auto Blood Pressure (mmHg): 120/77 Accompanied By: self Schedule Follow-up Appointment: Yes Clinical Summary of Care: Patient Declined Electronic Signature(s) Signed: 10/23/2021 5:34:43 PM By: [name] RN Entered By: [name] on 10/23/2021 17:34:19 -------------------------------------------------------------------------------- Lower Extremity Assessment Details Patient Name: Date of Service: Erin, [name] 10/23/2021 2:30 PM Medical Record Number: [id number] Patient Account Number: [id number] Date of Birth/Sex: Treating RN: 07/04/1955 (66 y.o. [name] Primary Care : [name] Other Clinician: Referring : Treating /Extender: [name] in Treatment: 34 Edema Assessment Assessed: [Left: No] [Right: No] Edema: [Left: Ye] [Right: s] Calf Left: Right: Point of Measurement: 32 cm From Medial Instep 45 cm 44.9 cm Ankle Left: Right: Point of Measurement: 10 cm From Medial Instep 22.9 cm 25  cm Knee To Floor Left: Right: From Medial Instep 41 cm 41 cm Electronic Signature(s) Signed: 10/23/2021 5:34:43 PM By: [name] RN Entered By: [name] on 10/23/2021 14:57:17 -------------------------------------------------------------------------------- Multi Wound Chart Details Patient Name: Date of Service: Erin Face D. 10/23/2021 2:30 PM Medical Record Number: [id number] Patient Account Number: [id number] Date of Birth/Sex: Treating RN: 1955-12-30 (66 y.o. F) Primary Care : Other Clinician: [name] Referring : Treating /Extender: [name] in Treatment: 34 Vital Signs Height(in): 62 Pulse(bpm): 78 Weight(lbs): 226 Blood Pressure(mmHg): 120/77 Body Mass Index(BMI): 41.3 Temperature(F): 98.1 Respiratory Rate(breaths/min):  16 Photos: [N/A:N/A] Left, Lateral Lower Leg N/A N/A Wound Location: Blister N/A N/A Wounding Event: Venous Leg Ulcer N/A N/A Primary Etiology: Cataracts, Lymphedema, N/A N/A Comorbid History: Hypertension, Peripheral Venous Disease, Osteoarthritis 12/13/2020 N/A N/A Date Acquired: 12 N/A N/A Weeks of Treatment: Open N/A N/A Wound Status: No N/A N/A Wound Recurrence: 0.1x0.1x0.1 N/A N/A Measurements L x W x D (cm) 0.008 N/A N/A A (cm) : rea 0.001 N/A N/A Volume (cm) : 100.00% N/A N/A % Reduction in A rea: 100.00% N/A N/A % Reduction in Volume: Full Thickness Without Exposed N/A N/A Classification: Support Structures Medium N/A N/A Exudate A mount: Serosanguineous N/A N/A Exudate Type: red, [name] N/A N/A Exudate Color: Distinct, outline attached N/A N/A Wound Margin: Medium (34-66%) N/A N/A Granulation A mount: Pink N/A N/A Granulation Quality: Medium (34-66%) N/A N/A Necrotic A mount: Fat Layer (Subcutaneous Tissue): Yes N/A N/A Exposed Structures: Fascia: No Tendon: No Muscle: No Joint: No Bone: No Medium (34-66%) N/A  N/A Epithelialization: Debridement - Selective/Open Wound N/A N/A Debridement: Pre-procedure Verification/Time Out 15:06 N/A N/A Taken: Lidocaine 4% Topical Solution N/A N/A Pain Control: Slough N/A N/A Tissue Debrided: Non-Viable Tissue N/A N/A Level: 0.01 N/A N/A Debridement A (sq cm): rea Curette N/A N/A Instrument: Minimum N/A N/A Bleeding: Pressure N/A N/A Hemostasis A chieved: 0 N/A N/A Procedural Pain: 0 N/A N/A Post Procedural Pain: Procedure was tolerated well N/A N/A Debridement Treatment Response: 0.1x0.1x0.1 N/A N/A Post Debridement Measurements L x W x D (cm) 0.001 N/A N/A Post Debridement Volume: (cm) Compression Therapy N/A N/A Procedures Performed: Debridement Treatment Notes Electronic Signature(s) Signed: 10/23/2021 3:11:32 PM By: [name] MD FACS Entered By: [name] on 10/23/2021 15:11:32 -------------------------------------------------------------------------------- Multi-Disciplinary Care Plan Details Patient Name: Date of Service: Erin Gerold [name] 10/23/2021 2:30 PM Medical Record Number: [id number] Patient Account Number: [id number] Date of Birth/Sex: Treating RN: [birth year] (66 y.o. [name] Primary Care : [name] Other Clinician: Referring : Treating /Extender: [name] in Treatment: 34 Multidisciplinary Care Plan reviewed with physician Active Inactive Venous Leg Ulcer Nursing Diagnoses: Actual venous Insuffiency (use after diagnosis is confirmed) Goals: Patient will maintain optimal edema control Date Initiated: 02/21/2021 Target Resolution Date: 12/12/2021 Goal Status: Active Patient/caregiver will verbalize understanding of disease process and disease management Date Initiated: 02/21/2021 Date Inactivated: 03/29/2021 Target Resolution Date: 03/24/2021 Goal Status: Met Interventions: Assess peripheral edema status every  visit. Compression as ordered Provide education on venous insufficiency Notes: Wound/Skin Impairment Nursing Diagnoses: Impaired tissue integrity Knowledge deficit related to ulceration/compromised skin integrity Goals: Patient/caregiver will verbalize understanding of skin care regimen Date Initiated: 02/21/2021 Target Resolution Date: 12/12/2021 Goal Status: Active Ulcer/skin breakdown will have a volume reduction of 30% by week 4 Date Initiated: 02/21/2021 Date Inactivated: 03/29/2021 Target Resolution Date: 03/24/2021 Goal Status: Unmet Unmet Reason: infection Ulcer/skin breakdown will have a volume reduction of 50% by week 8 Date Initiated: 03/29/2021 Date Inactivated: 04/19/2021 Target Resolution Date: 04/21/2021 Goal Status: Met Interventions: Assess patient/caregiver ability to obtain necessary supplies Assess patient/caregiver ability to perform ulcer/skin care regimen upon admission and as needed Assess ulceration(s) every visit Provide education on ulcer and skin care Notes: Electronic Signature(s) Signed: 10/23/2021 5:34:43 PM By: [name] RN Entered By: [name] on 10/23/2021 17:32:57 -------------------------------------------------------------------------------- Pain Assessment Details Patient Name: Date of Service: [name], [name]. 10/23/2021 2:30 PM Medical Record Number: [id number] Patient Account Number: [id number] Date of Birth/Sex: Treating RN: 11/30/1955 (66 y.o. [name] Primary Care : [name] Other  Clinician: Referring Annick Dimaio: Treating Montez Stryker/Extender: [name] in Treatment: 34 Active Problems Location of Pain Severity and Description of Pain Patient Has Paino No Site Locations Pain Management and Medication Current Pain Management: Electronic Signature(s) Signed: 10/23/2021 5:34:43 PM By: [name] RN Entered By: [name] on 10/23/2021  14:41:53 -------------------------------------------------------------------------------- Patient/Caregiver Education Details Patient Name: Date of Service: [name] 9/11/2023andnbsp2:30 PM Medical Record Number: [id number] Patient Account Number: [id number] Date of Birth/Gender: Treating RN: 08/17/1955 (66 y.o. [name] Primary Care Physician: [name] Other Clinician: Referring Physician: Treating Physician/Extender: [name] in Treatment: 46 Education Assessment Education Provided To: Patient Education Topics Provided Wound/Skin Impairment: Methods: Explain/Verbal Responses: Return demonstration correctly Electronic Signature(s) Signed: 10/23/2021 5:34:43 PM By: [name] RN Entered By: [name] on 10/23/2021 17:33:09 -------------------------------------------------------------------------------- Wound Assessment Details Patient Name: Date of Service: CHAN, ROSASCO D. 10/23/2021 2:30 PM Medical Record Number: [id number] Patient Account Number: [id number] Date of Birth/Sex: Treating RN: 1955/10/25 (66 y.o. [name] Primary Care Harlie Buening: [name] Other Clinician: Referring Akil Hoos: Treating Dollene Mallery/Extender: [name] in Treatment: 34 Wound Status Wound Number: 3 Primary Venous Leg Ulcer Etiology: Wound Location: Left, Lateral Lower Leg Wound Open Wounding Event: Blister Status: Date Acquired: 12/13/2020 Comorbid Cataracts, Lymphedema, Hypertension, Peripheral Venous Weeks Of Treatment: 34 History: Disease, Osteoarthritis Clustered Wound: No Photos Wound Measurements Length: (cm) 0.1 Width: (cm) 0.1 Depth: (cm) 0.1 Area: (cm) 0.008 Volume: (cm) 0.001 % Reduction in Area: 100% % Reduction in Volume: 100% Epithelialization: Medium (34-66%) Tunneling: No Undermining: No Wound Description Classification: Full Thickness Without Exposed  Support Structures Wound Margin: Distinct, outline attached Exudate Amount: Medium Exudate Type: Serosanguineous Exudate Color: red, [name] Foul Odor After Cleansing: No Slough/Fibrino Yes Wound Bed Granulation Amount: Medium (34-66%) Exposed Structure Granulation Quality: Pink Fascia Exposed: No Necrotic Amount: Medium (34-66%) Fat Layer (Subcutaneous Tissue) Exposed: Yes Necrotic Quality: Adherent Slough Tendon Exposed: No Muscle Exposed: No Joint Exposed: No Bone Exposed: No Treatment Notes Wound #3 (Lower Leg) Wound Laterality: Left, Lateral Cleanser Soap and Water Discharge Instruction: May shower and wash wound with dial antibacterial soap and water prior to dressing change. Wound Cleanser Discharge Instruction: Cleanse the wound with wound cleanser prior to applying a clean dressing using gauze sponges, not tissue or cotton balls. Peri-Wound Care Sween Lotion (Moisturizing lotion) Discharge Instruction: Apply moisturizing lotion as directed Topical Primary Dressing Endoform 2x2 in Discharge Instruction: Moisten with saline Secondary Dressing Zetuvit Plus 4x8 in Discharge Instruction: Apply over primary dressing as directed. Secured With Compression Wrap Unnaboot w/Calamine, 4x10 (in/yd) Discharge Instruction: Apply Unnaboot at top of leg CoFlex TLC XL 2-layer Compression System 4x7 (in/yd) Discharge Instruction: Apply CoFlex 2-layer compression as directed. (alt for 4 layer) Compression Stockings Add-Ons Electronic Signature(s) Signed: 10/23/2021 5:34:43 PM By: [name] RN Entered By: [name] on 10/23/2021 15:00:32 -------------------------------------------------------------------------------- Vitals Details Patient Name: Date of Service: Erin Gerold [name] 10/23/2021 2:30 PM Medical Record Number: [id number] Patient Account Number: [id number] Date of Birth/Sex: Treating RN: 1956-01-07 (66 y.o. [name] Primary Care Niranjan Rufener:  [name] Other Clinician: Referring Skylarr Liz: Treating Darrion Macaulay/Extender: [name] in Treatment: 34 Vital Signs Time Taken: 13:40 Temperature (F): 98.1 Height (in): 62 Pulse (bpm): 78 Weight (lbs): 226 Respiratory Rate (breaths/min): 16 Body Mass Index (BMI): 41.3 Blood Pressure (mmHg): 120/77 Reference Range: 80 - 120 mg / dl Electronic Signature(s) Signed: 10/23/2021 5:34:43 PM By: [name] RN Entered By: [name] on 10/23/2021  14:41:46

## 2021-10-24 NOTE — Progress Notes (Signed)
[name] ([id number]) Visit Report for 10/23/2021 Chief Complaint Document Details Patient Name: Date of Service: [name], [name] 10/23/2021 2:30 PM Medical Record Number: [id number] Patient Account Number: [id number] Date of Birth/Sex: Treating RN: 01-05-1956 (66 y.o. F) Primary Care Provider: [name] Other Clinician: Referring Provider: Treating Provider/Extender: [name] in Treatment: 34 Information Obtained from: Patient Chief Complaint 04/19/2021: The patient is here for ongoing follow-up regarding 2 left lower extremity wounds. Electronic Signature(s) Signed: 10/23/2021 3:11:38 PM By: [name] MD FACS Entered By: [name] on 10/23/2021 15:11:38 -------------------------------------------------------------------------------- Debridement Details Patient Name: Date of Service: Erin Gerold [name] 10/23/2021 2:30 PM Medical Record Number: [id number] Patient Account Number: [id number] Date of Birth/Sex: Treating RN: 08-23-1955 (66 y.o. [name] Primary Care Provider: [name] Other Clinician: Referring Provider: Treating Provider/Extender: [name] in Treatment: 34 Debridement Performed for Assessment: Wound #3 Left,Lateral Lower Leg Performed By: Physician [name], MD Debridement Type: Debridement Severity of Tissue Pre Debridement: Fat layer exposed Level of Consciousness (Pre-procedure): Awake and Alert Pre-procedure Verification/Time Out Yes - 15:06 Taken: Start Time: 15:06 Pain Control: Lidocaine 4% T opical Solution T Area Debrided (L x W): otal 0.1 (cm) x 0.1 (cm) = 0.01 (cm) Tissue and other material debrided: Non-Viable, Slough, Slough Level: Non-Viable Tissue Debridement Description: Selective/Open Wound Instrument: Curette Bleeding: Minimum Hemostasis Achieved: Pressure End Time: 15:07 Procedural Pain: 0 Post Procedural Pain: 0 Response to  Treatment: Procedure was tolerated well Level of Consciousness (Post- Awake and Alert procedure): Post Debridement Measurements of Total Wound Length: (cm) 0.1 Width: (cm) 0.1 Depth: (cm) 0.1 Volume: (cm) 0.001 Character of Wound/Ulcer Post Debridement: Improved Severity of Tissue Post Debridement: Fat layer exposed Post Procedure Diagnosis Same as Pre-procedure Notes Scribed for Dr Celine Ahr by J.[name] RN Electronic Signature(s) Signed: 10/23/2021 5:34:43 PM By: [name] RN Signed: 10/24/2021 8:47:02 AM By: [name] MD FACS Previous Signature: 10/23/2021 3:19:09 PM Version By: [name] MD FACS Entered By: [name] on 10/23/2021 17:31:32 -------------------------------------------------------------------------------- HPI Details Patient Name: Date of Service: Erin Gerold [name] 10/23/2021 2:30 PM Medical Record Number: [id number] Patient Account Number: [id number] Date of Birth/Sex: Treating RN: 13-Aug-1955 (66 y.o. 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 She tells [name] that she fell on the gravel driveway in July. She developed injuries on her distal lower leg which have not healed. She saw her primary physician on 11/15/2017 who noted her left shin injuries. Gave her antibiotics. At that point the wounds were almost circumferential however most were less than 1.5 cm. Weeping edema fluid was noted. She was referred here for evaluation. The patient has a history of chronic lower extremity edema. 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 She has had prior history with leg weeping fluid. She  does not wear compression stockings. She is not doing anything specific to these wound areas. 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 She also has lymphedema. Put  her in 3 layer compression and silver alginate last week. She has 3 small wounds with depth just lateral to the tibia. More problematically than this she has numerous shallow areas some of which are almost canal like in shape with tightly adherent painful debris. It would be very difficult and time- consuming to go through this and attempt to individually debride all these areas.. I changed her to collagen today to see if that would help with any of the surface debris on some of these wounds. Otherwise we will not be able to put this in compression we have to have someone change the dressing. The patient is not eligible for home health 12/25/17 on evaluation today patient actually appears to be doing rather well in regard to the ulcer on her lower extremity. Fortunately there does not appear to be evidence of infection at this time. She has been tolerating the dressing changes without complication. This includes the compression wrap. The only issue she had was that the wrap was initially placed over her bunion region which actually calls her some discomfort and pain. Other than that things seem to be going rather well. 01/01/2018 Seen today for follow-up and management of left lower extremity wound and lymphedema. T oday she presents with a new wound towards to the left lateral LE. Recently treated with a 7 day course of amoxicillin; reason for antibiotic dose is unknown at this time. Tolerating current treatment of collagen with 4- layer wraps. She obtained a  venous reflux study on 12/25/17. Studies show on the right abnormal reflux times of the popliteal vein, great saphenous vein at the saphenofemoral junction at the proximal thigh, great saphenous vein at the mid calf, and origin of the small saphenous vein.No superficial thrombosis. No deep vein thrombosis in the common femoral, femoral,and popliteal veins. Left abnormal reflex times as well of the common femoral vein, popliteal vein, and a great saphenous vein at the saphenofemoral junction, In great saphenous vein at the mid thigh w/o thrombosis. Has any issues or concerns during visit today. Recommended follow-up to vascular specialist due to abnormalities from the venous reflux study. Denies fever, pain, chills, dizziness, nausea, or vomiting. 01/08/18 upon evaluation today the patient actually seems to be showing some signs of improvement in my opinion at this point in regard to the lower extremity ulcerated areas. She still has a lot of drainage but fortunately nothing that appears to be too significant currently. I have been very happy with the overall progress I see today compared to where things were during the last evaluation that I had with her. Nonetheless she has not had her appointment with the vein specialist as of yet in fact we were able to get this approved for her today and confirmed with them she will be seeing them on December 26. Nonetheless in general I do feel like the compression wraps is doing well for her. 01/17/18; quite a bit of improvement since last time I saw this patient she has a small open area remaining on the left lateral calf and even smaller area medially. She has lymphedema chronic stasis changes with distal skin fibrosis. She has an appointment with vascular surgery later this month 01/24/2018; the patient's medial leg has closed. Still a small open area on the left lateral leg. She states that the 4 layer compression we put on last week was too tight and she had  to take it off over a few days ago. We have had resultant increase in her lymphedema  in the dorsal foot and a proximal calf. Fortunately that does not seem to have resulted in any deterioration in her wounds The patient is going to need compression stockings. We have given her measurements to phone elastic therapy in [place]. She has vascular surgery consult on December 26 02/07/18; she's had continuous contraction on the left lateral leg wound which is now very small. Currently using silver alginate under 3 layer compression She saw Dr. Trula Slade of vascular surgery on 02/06/18. It was noted that she had a normal reflux times in the popliteal vein, great saphenous vein at the saphenofemoral junction, great saphenous vein at the proximal thigh great saphenous vein at the mid calf and origin of the small saphenous vein. It was noted that she had significant reflux in the left saphenous veins with diameter measurements in the 0.7-0.8 cm range. It was felt she would benefit from laser ablation to help minimize the risk of ulcer recurrence. It was recommended that she wear 20-30 thigh-high compression stockings and have follow-up in 4-6 weeks 02/17/2018; I thought this lady would be healed however her compression slipped down and she developed increasing swelling and the wound is actually larger. 1/13; we had deterioration last week after the patient's compression slipped down and she developed periwound swelling. I increased her compression before layers we have been using silver alginate we are a lot better again today. She has her compression stockings in waiting 1/23; the patient's wounds are totally healed today. She has her stockings. This was almost circumferential skin damage. She follows up with Dr. Trula Slade of vascular surgery next Monday. READMISSION 02/21/2021 This is a now 66 year old woman that we had in clinic here discharging in January 2020 with wounds on her left calf chronic venous  insufficiency. She was discharged with 30/40 stockings. It does not sound like she has worn stockings in about a year largely from not being able to get them on herself. In November she developed new blisters on her legs an area laterally is opened into a fairly sizable wound. She has weeping posteriorly as well. She has been using Neosporin and Band-Aids. The patient did see Dr. Trula Slade in 2019 and 2020. He felt she might benefit from laser ablation in her left saphenous veins although because of the wound that healed I do not think he went through with it. She might benefit from seeing him again. Her ABI on the left is 1. 1/17; patient's wound on the posterior left calf is closed she has the 2 large areas last week. We put her in 4-layer compression for edema control is a lot better. Our intake nurse noted greenish drainage and odor. We have been using silver alginate 1/25; PCR culture I did have the substantial wound area on the left lateral lower leg showed staff aureus and group A strep. Low titers of coag negative staph which are probably skin contaminants. Resistance detected to tetracycline methicillin and macrolides. I gave her a starter kit of Nuzyra 150 mg x 3 for 2 days then 300 mg for a further 7 days. Marked odor considerable increase in surrounding erythema. We are using Iodoflex last week however I have changed to silver alginate with underlying Bactroban 2/1; she is completing her [place] tomorrow. The degree of erythema around the wounds looks a lot better. Odor has improved. I gave her silver alginate and Bactroban last week and changing her back to Iodoflex to continue with ongoing debridement 2/8; the periwound looks a lot better. Surface of the wound  also looks somewhat better although there is still ongoing debridement to be done we have been using Iodoflex under compression. Primary dressing and silver alginate 2/15; left posterior calf. Some improvement in the surface of the  wound but still very gritty we have been using Iodoflex under compression. 04/05/2021: Left lateral posterior calf. She continues to have significant amounts of drainage, some of which is probably comprised of the Iodoflex microbleeds. There is no odor to the drainage. The satellite lesion on the more posterior aspect of the calf is epithelializing nicely and has contracted quite a bit. There is robust granulation tissue in the dominant wound with minimal adherent slough. 04/12/2021: The satellite lesion has nearly closed. She continues to have good granulation tissue with minimal slough of the larger primary wound. She continues to have a fair amount of drainage, but I think this is less secondary to switching her dressing from Iodoflex to Prisma. 04/19/2021: The satellite lesion is almost completely epithelialized. The larger primary wound continues to contract with good granulation tissue and minimal slough. She is currently in [place] with compression. 04/26/2021: The satellite lesion has closed. The larger primary wound has contracted further and the granulation tissue is robust without being hypertrophic. Minimal slough is present. 05/03/2021: The satellite lesion remains closed. The larger primary wound has a bit of slough, but has also contracted further with good perimeter epithelialization. Good granulation tissue at the wound surface. There was some greenish drainage on the dressing when it was removed; it is not the blue-green typically associated with Pseudomonas aeruginosa, however. 05/10/2021: The primary wound continues to contract. There is minimal slough. The granulation tissue at 9:00 is a bit hypertrophic. No significant drainage. No odor. 05/17/2021: The wound is a little bit smaller today with good granulation tissue. Minimal slough. No significant drainage or odor. 05/24/2021: The wound continues to contract and has a nice base of granulation tissue. Small amount of slough. No concern for  infection. 05/31/2021: For some reason, the wound measured slightly larger today but overall it still appears to be in good condition with a nice base of granulation tissue and minimal slough. 06/07/2021: The wound is smaller today. Good granulation tissue and minimal slough. 06/14/2021: The wound is unchanged in size. She continues to accumulate some slough. Good granulation tissue on the surface. 06/20/2021: The wound is smaller today. Minimal slough with good granulation tissue. 06/27/2021: The wound on her left lateral leg is smaller today with just a bit of slough and eschar accumulation. Unfortunately, she has opened a new superficial wound on her right lower extremity. She has 3+ pitting edema to the knees on that leg and she does not wear compression stockings. 07/04/2021: In addition to the superficial wound on her right lower extremity that she opened up last week, she has opened 3 additional sites on the same leg. Her compression wraps were clearly not in correct position when she came to clinic today as they were at about the mid calf level rather than to the tibial tuberosity. She says that they slipped earlier and she had to come back on Friday to have them redone. The wound on her left lateral leg is perhaps slightly larger with some slough accumulation. 07/11/2021: The superficial wounds on her right lower extremity are nearly closed. They just have a thin layer of eschar overlying them. The wound on her left lateral leg is a little bit shallower and has a bit of slough accumulation. 07/17/2021: The right medial lower extremity leg wounds have  almost completely closed; one of them just has a tiny opening with a little bit of serous drainage. The left lateral leg wound is really unchanged. It seems to be stalled. 07/24/2021: The right leg wounds are completely closed. The left lateral leg wound is actually bigger today. It is a bit more tender. There is a minor accumulation of slough on the  surface. 07/31/2021: The culture that I took last week was positive for MRSA. We added mupirocin under the silver alginate and I prescribed doxycycline. She has been tolerating this well. The wound is smaller today but does have slough accumulation. No significant pain or drainage. 08/07/2021: She completed her course of doxycycline. The wound looks much better today. It is smaller and the periwound is less inflamed. She does have some slough accumulation on the surface. 08/14/2021: The wound continues to contract. There is slough on the wound surface, but the periwound is intact without inflammation or induration. 08/21/2021: The wound is down to just 2 small open sites. They both have a bit of slough accumulation. 08/28/2021: The wound is down to just 1 small open site with a little bit of slough and eschar accumulation. 09/04/2021: The wound continues to contract but remains open. It is clean without any slough. 09/12/2021: No real change in the overall wound dimensions, but it is flush with the surrounding skin. There is a little bit of slough accumulation. Edema control is good. 09/22/2021: The wound is smaller and even more superficial. Minimal slough accumulation. Good control of edema. 09/29/2021: The wound continues to contract. She reports that it was a little bit "twingey" during the week. It is a little bit dry on inspection today. Light accumulation of slough. Good edema control. 10/06/2021: The wound is about the same size today. There is a little slough on the surface. Edema control is good. 10/13/2021: The wound is about the same size but more superficial. A little bit of slough on the surface. 10/23/2021: The wound is slightly smaller and continues to fill in. Minimal slough on the wound surface. Electronic Signature(s) Signed: 10/23/2021 3:12:24 PM By: [name] MD FACS Entered By: [name] on 10/23/2021  15:12:24 -------------------------------------------------------------------------------- Physical Exam Details Patient Name: Date of Service: Erin Gerold [name] 10/23/2021 2:30 PM Medical Record Number: [id number] Patient Account Number: [id number] Date of Birth/Sex: Treating RN: 05-09-1955 (66 y.o. F) Primary Care Provider: [name] Other Clinician: Referring Provider: Treating Provider/Extender: [name] in Treatment: 34 Constitutional . . . . No acute distress.Marland Kitchen Respiratory Normal work of breathing on room air.. Notes 10/23/2021: The wound is slightly smaller and continues to fill in. Minimal slough on the wound surface. Electronic Signature(s) Signed: 10/23/2021 3:12:51 PM By: [name] MD FACS Entered By: [name] on 10/23/2021 15:12:51 -------------------------------------------------------------------------------- Physician Orders Details Patient Name: Date of Service: Erin Gerold [name] 10/23/2021 2:30 PM Medical Record Number: [id number] Patient Account Number: [id number] Date of Birth/Sex: Treating RN: [birth year] (66 y.o. [name] Primary Care Provider: [name] Other Clinician: Referring Provider: Treating Provider/Extender: [name] in Treatment: 89 Verbal / Phone Orders: No Diagnosis Coding ICD-10 Coding Code Description 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 (idiopathic) with ulcer and inflammation of left lower extremity L97.828 Non-pressure chronic ulcer of other part of left lower leg with other specified severity Follow-up Appointments ppointment in 1 week. - Dr. Celine Ahr Room 3 - Monday 10/30/21 at 2:15pm Return A Anesthetic (In clinic) Topical Lidocaine 4% applied to wound bed - Used in clinic  Bathing/ Shower/ Hygiene May shower with protection but do not get wound dressing(s) wet. - Ok to use [employer], can purchase at [facility], [facility], or [employer] Edema Control -  Lymphedema / SCD / Other Elevate legs to the level of the heart or above for 30 minutes daily and/or when sitting, a frequency of: - throughout the day Avoid standing for long periods of time. Exercise regularly Compression stocking or Garment 20-30 mm/Hg pressure to: - Brochure for Elastic Therapy. Leg measurements included Wound Treatment Wound #3 - Lower Leg Wound Laterality: Left, Lateral Cleanser: Soap and Water 1 x Per Week/30 Days Discharge Instructions: May shower and wash wound with dial antibacterial soap and water prior to dressing change. Cleanser: Wound Cleanser 1 x Per Week/30 Days Discharge Instructions: Cleanse the wound with wound cleanser prior to applying a clean dressing using gauze sponges, not tissue or cotton balls. Peri-Wound Care: Sween Lotion (Moisturizing lotion) 1 x Per Week/30 Days Discharge Instructions: Apply moisturizing lotion as directed Prim Dressing: Endoform 2x2 in 1 x Per Week/30 Days ary Discharge Instructions: Moisten with saline Secondary Dressing: Zetuvit Plus 4x8 in 1 x Per Week/30 Days Discharge Instructions: Apply over primary dressing as directed. Compression Wrap: Unnaboot w/Calamine, 4x10 (in/yd) 1 x Per Week/30 Days Discharge Instructions: Apply Unnaboot at top of leg Compression Wrap: CoFlex TLC XL 2-layer Compression System 4x7 (in/yd) 1 x Per Week/30 Days Discharge Instructions: Apply CoFlex 2-layer compression as directed. (alt for 4 layer) Electronic Signature(s) Signed: 10/23/2021 5:34:43 PM By: [name] RN Signed: 10/24/2021 8:47:02 AM By: [name] MD FACS Previous Signature: 10/23/2021 3:19:09 PM Version By: [name] MD FACS Entered By: [name] on 10/23/2021 17:32:25 -------------------------------------------------------------------------------- Problem List Details Patient Name: Date of Service: Erin Face D. 10/23/2021 2:30 PM Medical Record Number: [id number] Patient Account Number:  [id number] Date of Birth/Sex: Treating RN: 11/07/1955 (66 y.o. F) Primary Care Provider: [name] Other Clinician: Referring Provider: Treating Provider/Extender: [name] in Treatment: 34 Active Problems ICD-10 Encounter Code Description Active Date MDM Diagnosis 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 (idiopathic) with ulcer and inflammation of left 02/21/2021 No Yes lower extremity L97.828 Non-pressure chronic ulcer of other part of left lower leg with other specified 02/21/2021 No Yes severity Inactive Problems ICD-10 Code Description Active Date Inactive Date L03.116 Cellulitis of left lower limb 03/08/2021 03/08/2021 L97.811 Non-pressure chronic ulcer of other part of right lower leg limited to breakdown of skin 06/27/2021 06/27/2021 Resolved Problems Electronic Signature(s) Signed: 10/23/2021 3:11:24 PM By: [name] MD FACS Entered By: [name] on 10/23/2021 15:11:24 -------------------------------------------------------------------------------- Progress Note Details Patient Name: Date of Service: Erin Gerold [name] 10/23/2021 2:30 PM Medical Record Number: [id number] Patient Account Number: [id number] Date of Birth/Sex: Treating RN: 1955-09-13 (66 y.o. F) Primary Care Provider: [name] Other Clinician: Referring Provider: Treating Provider/Extender: [name] in Treatment: 35 Subjective Chief Complaint Information obtained from Patient 04/19/2021: The patient is here for ongoing follow-up regarding 2 left lower extremity wounds. 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 She tells [name] that she fell on the gravel driveway in July. She developed injuries on her distal lower leg which have not healed. She saw her primary physician on 11/15/2017 who noted her left shin injuries. Gave her antibiotics. At that point  the wounds were almost circumferential however most were less than 1.5 cm. Weeping edema fluid was noted. She was referred here for evaluation.  The patient has a history of chronic lower extremity edema. 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 She has had prior history with leg weeping fluid. She does not wear compression stockings. She is not doing anything specific to these wound areas. 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 She also has lymphedema. Put her in 3 layer compression and silver alginate last week. She has 3 small wounds with depth just lateral to the tibia. More problematically than this she has numerous shallow areas some of which are almost canal like in shape with tightly adherent painful debris. It would be very difficult and time- consuming to go through this and attempt to individually debride all these areas.. I changed her to collagen today to see if that would help with any of the surface debris on some of these wounds. Otherwise we will not be able to put this in compression we have to have someone change the dressing. The patient is not eligible for home health 12/25/17 on evaluation today patient actually appears to be doing rather well in regard to the ulcer on her lower extremity. Fortunately there does not appear to be evidence of infection at this time. She has been tolerating the dressing changes without complication. This includes the compression wrap. The only issue she had was that the wrap was initially placed over her bunion region which actually calls her some discomfort  and pain. Other than that things seem to be going rather well. 01/01/2018 Seen today for follow-up and management of left lower extremity wound and lymphedema. T oday she presents with a new wound towards to the left lateral LE. Recently treated with a 7 day course of amoxicillin; reason for antibiotic dose is unknown at this time. Tolerating current treatment of collagen with 4- layer wraps. She obtained a venous reflux study on 12/25/17. Studies show on the right abnormal reflux times of the popliteal vein, great saphenous vein at the saphenofemoral junction at the proximal thigh, great saphenous vein at the mid calf, and origin of the small saphenous vein.No superficial thrombosis. No deep vein thrombosis in the common femoral, femoral,and popliteal veins. Left abnormal reflex times as well of the common femoral vein, popliteal vein, and a great saphenous vein at the saphenofemoral junction, In great saphenous vein at the mid thigh w/o thrombosis. Has any issues or concerns during visit today. Recommended follow-up to vascular specialist due to abnormalities from the venous reflux study. Denies fever, pain, chills, dizziness, nausea, or vomiting. 01/08/18 upon evaluation today the patient actually seems to be showing some signs of improvement in my opinion at this point in regard to the lower extremity ulcerated areas. She still has a lot of drainage but fortunately nothing that appears to be too significant currently. I have been very happy with the overall progress I see today compared to where things were during the last evaluation that I had with her. Nonetheless she has not had her appointment with the vein specialist as of yet in fact we were able to get this approved for her today and confirmed with them she will be seeing them on December 26. Nonetheless in general I  do feel like the compression wraps is doing well for her. 01/17/18; quite a bit of improvement since last time I saw this  patient she has a small open area remaining on the left lateral calf and even smaller area medially. She has lymphedema chronic stasis changes with distal skin fibrosis. She has an appointment with vascular surgery later this month 01/24/2018; the patient's medial leg has closed. Still a small open area on the left lateral leg. She states that the 4 layer compression we put on last week was too tight and she had to take it off over a few days ago. We have had resultant increase in her lymphedema in the dorsal foot and a proximal calf. Fortunately that does not seem to have resulted in any deterioration in her wounds The patient is going to need compression stockings. We have given her measurements to phone elastic therapy in [place]. She has vascular surgery consult on December 26 02/07/18; she's had continuous contraction on the left lateral leg wound which is now very small. Currently using silver alginate under 3 layer compression She saw Dr. Trula Slade of vascular surgery on 02/06/18. It was noted that she had a normal reflux times in the popliteal vein, great saphenous vein at the saphenofemoral junction, great saphenous vein at the proximal thigh great saphenous vein at the mid calf and origin of the small saphenous vein. It was noted that she had significant reflux in the left saphenous veins with diameter measurements in the 0.7-0.8 cm range. It was felt she would benefit from laser ablation to help minimize the risk of ulcer recurrence. It was recommended that she wear 20-30 thigh-high compression stockings and have follow-up in 4-6 weeks 02/17/2018; I thought this lady would be healed however her compression slipped down and she developed increasing swelling and the wound is actually larger. 1/13; we had deterioration last week after the patient's compression slipped down and she developed periwound swelling. I increased her compression before layers we have been using silver alginate we are a  lot better again today. She has her compression stockings in waiting 1/23; the patient's wounds are totally healed today. She has her stockings. This was almost circumferential skin damage. She follows up with Dr. Trula Slade of vascular surgery next Monday. READMISSION 02/21/2021 This is a now 66 year old woman that we had in clinic here discharging in January 2020 with wounds on her left calf chronic venous insufficiency. She was discharged with 30/40 stockings. It does not sound like she has worn stockings in about a year largely from not being able to get them on herself. In November she developed new blisters on her legs an area laterally is opened into a fairly sizable wound. She has weeping posteriorly as well. She has been using Neosporin and Band-Aids. The patient did see Dr. Trula Slade in 2019 and 2020. He felt she might benefit from laser ablation in her left saphenous veins although because of the wound that healed I do not think he went through with it. She might benefit from seeing him again. Her ABI on the left is 1. 1/17; patient's wound on the posterior left calf is closed she has the 2 large areas last week. We put her in 4-layer compression for edema control is a lot better. Our intake nurse noted greenish drainage and odor. We have been using silver alginate 1/25; PCR culture I did have the substantial wound area on the left lateral lower leg showed staff aureus and group A strep. Low titers  of coag negative staph which are probably skin contaminants. Resistance detected to tetracycline methicillin and macrolides. I gave her a starter kit of Nuzyra 150 mg x 3 for 2 days then 300 mg for a further 7 days. Marked odor considerable increase in surrounding erythema. We are using Iodoflex last week however I have changed to silver alginate with underlying Bactroban 2/1; she is completing her [place] tomorrow. The degree of erythema around the wounds looks a lot better. Odor has improved. I  gave her silver alginate and Bactroban last week and changing her back to Iodoflex to continue with ongoing debridement 2/8; the periwound looks a lot better. Surface of the wound also looks somewhat better although there is still ongoing debridement to be done we have been using Iodoflex under compression. Primary dressing and silver alginate 2/15; left posterior calf. Some improvement in the surface of the wound but still very gritty we have been using Iodoflex under compression. 04/05/2021: Left lateral posterior calf. She continues to have significant amounts of drainage, some of which is probably comprised of the Iodoflex microbleeds. There is no odor to the drainage. The satellite lesion on the more posterior aspect of the calf is epithelializing nicely and has contracted quite a bit. There is robust granulation tissue in the dominant wound with minimal adherent slough. 04/12/2021: The satellite lesion has nearly closed. She continues to have good granulation tissue with minimal slough of the larger primary wound. She continues to have a fair amount of drainage, but I think this is less secondary to switching her dressing from Iodoflex to Prisma. 04/19/2021: The satellite lesion is almost completely epithelialized. The larger primary wound continues to contract with good granulation tissue and minimal slough. She is currently in [place] with compression. 04/26/2021: The satellite lesion has closed. The larger primary wound has contracted further and the granulation tissue is robust without being hypertrophic. Minimal slough is present. 05/03/2021: The satellite lesion remains closed. The larger primary wound has a bit of slough, but has also contracted further with good perimeter epithelialization. Good granulation tissue at the wound surface. There was some greenish drainage on the dressing when it was removed; it is not the blue-green typically associated with Pseudomonas aeruginosa,  however. 05/10/2021: The primary wound continues to contract. There is minimal slough. The granulation tissue at 9:00 is a bit hypertrophic. No significant drainage. No odor. 05/17/2021: The wound is a little bit smaller today with good granulation tissue. Minimal slough. No significant drainage or odor. 05/24/2021: The wound continues to contract and has a nice base of granulation tissue. Small amount of slough. No concern for infection. 05/31/2021: For some reason, the wound measured slightly larger today but overall it still appears to be in good condition with a nice base of granulation tissue and minimal slough. 06/07/2021: The wound is smaller today. Good granulation tissue and minimal slough. 06/14/2021: The wound is unchanged in size. She continues to accumulate some slough. Good granulation tissue on the surface. 06/20/2021: The wound is smaller today. Minimal slough with good granulation tissue. 06/27/2021: The wound on her left lateral leg is smaller today with just a bit of slough and eschar accumulation. Unfortunately, she has opened a new superficial wound on her right lower extremity. She has 3+ pitting edema to the knees on that leg and she does not wear compression stockings. 07/04/2021: In addition to the superficial wound on her right lower extremity that she opened up last week, she has opened 3 additional sites on the same  leg. Her compression wraps were clearly not in correct position when she came to clinic today as they were at about the mid calf level rather than to the tibial tuberosity. She says that they slipped earlier and she had to come back on Friday to have them redone. The wound on her left lateral leg is perhaps slightly larger with some slough accumulation. 07/11/2021: The superficial wounds on her right lower extremity are nearly closed. They just have a thin layer of eschar overlying them. The wound on her left lateral leg is a little bit shallower and has a bit of slough  accumulation. 07/17/2021: The right medial lower extremity leg wounds have almost completely closed; one of them just has a tiny opening with a little bit of serous drainage. The left lateral leg wound is really unchanged. It seems to be stalled. 07/24/2021: The right leg wounds are completely closed. The left lateral leg wound is actually bigger today. It is a bit more tender. There is a minor accumulation of slough on the surface. 07/31/2021: The culture that I took last week was positive for MRSA. We added mupirocin under the silver alginate and I prescribed doxycycline. She has been tolerating this well. The wound is smaller today but does have slough accumulation. No significant pain or drainage. 08/07/2021: She completed her course of doxycycline. The wound looks much better today. It is smaller and the periwound is less inflamed. She does have some slough accumulation on the surface. 08/14/2021: The wound continues to contract. There is slough on the wound surface, but the periwound is intact without inflammation or induration. 08/21/2021: The wound is down to just 2 small open sites. They both have a bit of slough accumulation. 08/28/2021: The wound is down to just 1 small open site with a little bit of slough and eschar accumulation. 09/04/2021: The wound continues to contract but remains open. It is clean without any slough. 09/12/2021: No real change in the overall wound dimensions, but it is flush with the surrounding skin. There is a little bit of slough accumulation. Edema control is good. 09/22/2021: The wound is smaller and even more superficial. Minimal slough accumulation. Good control of edema. 09/29/2021: The wound continues to contract. She reports that it was a little bit "twingey" during the week. It is a little bit dry on inspection today. Light accumulation of slough. Good edema control. 10/06/2021: The wound is about the same size today. There is a little slough on the surface. Edema  control is good. 10/13/2021: The wound is about the same size but more superficial. A little bit of slough on the surface. 10/23/2021: The wound is slightly smaller and continues to fill in. Minimal slough on the wound surface. Patient History Information obtained from Patient. Family History Heart Disease - Mother,Father, Hypertension - Mother,Father, Kidney Disease - Mother, Thyroid Problems - Mother, No family history of Cancer, Diabetes, Hereditary Spherocytosis, Lung Disease, Seizures, Stroke, Tuberculosis. Social History Never smoker, Marital Status - Single, Alcohol Use - Never, Drug Use - No History, Caffeine Use - Daily - coffee. Medical History Eyes Patient has history of Cataracts Hematologic/Lymphatic Patient has history of Lymphedema Cardiovascular Patient has history of Hypertension, Peripheral Venous Disease Integumentary (Skin) Denies history of History of Burn Musculoskeletal Patient has history of Osteoarthritis Hospitalization/Surgery History - colostomy reversal. 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 Medical A Surgical History Notes nd Constitutional Symptoms (General Health) morbid obesity Cardiovascular schamberg disease, hyperlipidemia Gastrointestinal diverticulitis , h/o obstruction due to diverticulitis , colostomy and  colostomy reversal Endocrine hypothyroidism Objective Constitutional No acute distress.. Vitals Time Taken: 1:40 PM, Height: 62 in, Weight: 226 lbs, BMI: 41.3, Temperature: 98.1 F, Pulse: 78 bpm, Respiratory Rate: 16 breaths/min, Blood Pressure: 120/77 mmHg. Respiratory Normal work of breathing on room air.. General Notes: 10/23/2021: The wound is slightly smaller and continues to fill in. Minimal slough on the wound surface. Integumentary (Hair, Skin) Wound #3 status is Open. Original cause of wound was Blister. The date acquired was: 12/13/2020. The wound has been in treatment 34 weeks. The wound is located on the Left,Lateral Lower Leg.  The wound measures 0.1cm length x 0.1cm width x 0.1cm depth; 0.008cm^2 area and 0.001cm^3 volume. There is Fat Layer (Subcutaneous Tissue) exposed. There is no tunneling or undermining noted. There is a medium amount of serosanguineous drainage noted. The wound margin is distinct with the outline attached to the wound base. There is medium (34-66%) pink granulation within the wound bed. There is a medium (34-66%) amount of necrotic tissue within the wound bed including Adherent Slough. 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 There was a Selective/Open Wound Non-Viable Tissue Debridement with a total area of 0.01 sq cm performed by [name], MD. With the following instrument(s): Curette to remove Non-Viable tissue/material. Material removed includes [facility] after achieving pain control using Lidocaine 4% Topical Solution. No specimens were taken. A time out was conducted at 15:06, prior to the start of the procedure. A Minimum amount of bleeding was controlled with Pressure. The procedure was tolerated well with a pain level of 0 throughout and a pain level of 0 following the procedure. Post Debridement Measurements: 0.1cm length x 0.1cm width x 0.1cm depth; 0.001cm^3 volume. Character of Wound/Ulcer Post Debridement is improved. Severity of Tissue Post Debridement is: Fat layer exposed. Post procedure Diagnosis Wound #3: Same as Pre-Procedure General Notes: Scribed for Dr Celine Ahr by J.[name] RN. Pre-procedure diagnosis of Wound #3 is a Venous Leg Ulcer located on the Left,Lateral Lower Leg . There was a Double Layer Compression Therapy  Procedure by [name], RN. Post procedure Diagnosis Wound #3: Same as Pre-Procedure Plan Follow-up Appointments: Return Appointment in 1 week. - Dr. Celine Ahr Room 3 - Monday 10/30/21 at 2:15pm Anesthetic: (In clinic) Topical Lidocaine 4% applied to wound bed - Used in clinic Bathing/ Shower/ Hygiene: May shower with protection but do not get wound dressing(s) wet. - Ok to use Market [employer], can purchase at [facility], [facility], or [employer] Edema Control - Lymphedema / SCD / Other: Elevate legs to the level of the heart or above for 30 minutes daily and/or when sitting, a frequency of: - throughout the day Avoid standing for long periods of time. Exercise regularly Compression stocking or Garment 20-30 mm/Hg pressure to: - Brochure for Elastic Therapy. Leg measurements included WOUND #3: - Lower Leg Wound Laterality: Left, Lateral Cleanser: Soap and Water 1 x Per Week/30 Days Discharge Instructions: May shower and wash wound with dial antibacterial soap and water prior to dressing change. Cleanser: Wound Cleanser 1 x Per Week/30 Days Discharge Instructions: Cleanse the wound with wound cleanser prior to applying a clean dressing using gauze sponges, not tissue or cotton balls. Peri-Wound Care: Sween Lotion (Moisturizing lotion) 1 x  Per Week/30 Days Discharge Instructions: Apply moisturizing lotion as directed Prim Dressing: Endoform 2x2 in 1 x Per Week/30 Days ary Discharge Instructions: Moisten with saline Secondary Dressing: Zetuvit Plus 4x8 in 1 x Per Week/30 Days Discharge Instructions: Apply over primary dressing as directed. Com pression Wrap: Unnaboot w/Calamine, 4x10 (in/yd) 1 x Per Week/30 Days Discharge Instructions: Apply Unnaboot at top of leg Com pression Wrap: CoFlex TLC XL 2-layer Compression System 4x7 (in/yd) 1 x Per Week/30 Days Discharge Instructions: Apply CoFlex 2-layer compression as directed. (alt for 4 layer) 10/23/2021: The wound is slightly smaller and continues to  fill in. Minimal slough on the wound surface. I used a curette to debride the slough from the wound surface. We will continue endoform and Coflex compression. Follow-up in 1 week. Electronic Signature(s) Signed: 10/23/2021 5:34:43 PM By: [name] RN Signed: 10/24/2021 8:47:02 AM By: [name] MD FACS Previous Signature: 10/23/2021 3:13:29 PM Version By: [name] MD FACS Entered By: [name] on 10/23/2021 17:32:45 -------------------------------------------------------------------------------- HxROS Details Patient Name: Date of Service: Erin Gerold [name] 10/23/2021 2:30 PM Medical Record Number: [id number] Patient Account Number: [id number] Date of Birth/Sex: Treating RN: [birth year] (66 y.o. F) Primary Care Provider: [name] Other Clinician: Referring Provider: Treating Provider/Extender: [name] in Treatment: 34 Information Obtained From Patient Constitutional Symptoms (General Health) Medical History: Past Medical History Notes: morbid obesity Eyes Medical History: Positive for: Cataracts Hematologic/Lymphatic Medical History: Positive for: Lymphedema Cardiovascular Medical History: Positive for: Hypertension; Peripheral Venous Disease Past Medical History Notes: schamberg disease, hyperlipidemia Gastrointestinal Medical History: Past Medical History Notes: diverticulitis , h/o obstruction due to diverticulitis , colostomy and colostomy reversal Endocrine Medical History: Past Medical History Notes: hypothyroidism Integumentary (Skin) Medical History: Negative for: History of Burn Musculoskeletal Medical History: Positive for: Osteoarthritis HBO Extended History Items Eyes: Cataracts Immunizations Pneumococcal Vaccine: Received Pneumococcal Vaccination: Yes Received Pneumococcal Vaccination On or After 60th Birthday: Yes Implantable Devices None Hospitalization / Surgery History Type of  Hospitalization/Surgery colostomy reversal 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 Family and Social History Cancer: No; Diabetes: No; Heart Disease: Yes - Mother,Father; Hereditary Spherocytosis: No; Hypertension: Yes - Mother,Father; Kidney Disease: Yes - Mother; Lung Disease: No; Seizures: No; Stroke: No; Thyroid Problems: Yes - Mother; Tuberculosis: No; Never smoker; Marital Status - Single; Alcohol Use: Never; Drug Use: No History; Caffeine Use: Daily - coffee; Financial Concerns: No; Food, Clothing or Shelter Needs: No; Support System Lacking: No; Transportation Concerns: No Electronic Signature(s) Signed: 10/23/2021 3:19:09 PM By: [name] MD FACS Entered By: [name] on 10/23/2021 15:12:28 -------------------------------------------------------------------------------- SuperBill Details Patient Name: Date of Service: Erin Gerold [name] 10/23/2021 Medical Record Number: [id number] Patient Account Number: [id number] Date of Birth/Sex: Treating RN: [birth year] (66 y.o. F) Primary Care Provider: [name] Other Clinician: Referring Provider: Treating Provider/Extender: [name] in Treatment: 34 Diagnosis Coding ICD-10 Codes Code Description [phone] Chronic venous hypertension (idiopathic) with ulcer and inflammation of left lower extremity L97.828 Non-pressure chronic ulcer of other part of left lower leg with other specified severity Facility Procedures CPT4 Code: [id number] Description: [id number] - DEBRIDE WOUND 1ST 20 SQ CM OR < ICD-10 Diagnosis Description L97.828 Non-pressure chronic ulcer of other part of left lower leg with other specified sev 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 (idiopathic) with ulcer and inflammation of  left lower Modifier: erity extremity Quantity: 1 Physician Procedures : CPT4 Code Description Modifier [id number] - WC PHYS LEVEL 3 - EST PT 25 ICD-10 Diagnosis Description L97.828 Non-pressure chronic ulcer of  other part of left lower leg with other specified severity 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 (idiopathic)  with ulcer and inflammation of left lower extremity Quantity: 1 : [id number] - WC PHYS DEBR WO ANESTH 20 SQ CM ICD-10 Diagnosis Description L97.828 Non-pressure chronic ulcer of other part of left lower leg with other specified severity 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 (idiopathic) with ulcer and inflammation  of left lower extremity Quantity: 1 Electronic Signature(s) Signed: 10/23/2021 3:13:48 PM By: [name] MD FACS Entered By: [name] on 10/23/2021 15:13:48

## 2021-10-30 ENCOUNTER — Other Ambulatory Visit (HOSPITAL_COMMUNITY): Payer: Self-pay

## 2021-10-30 ENCOUNTER — Encounter: Payer: Self-pay | Admitting: Internal Medicine

## 2021-10-30 ENCOUNTER — Ambulatory Visit: Payer: 59 | Admitting: Internal Medicine

## 2021-10-30 ENCOUNTER — Encounter (HOSPITAL_BASED_OUTPATIENT_CLINIC_OR_DEPARTMENT_OTHER): Payer: 59 | Admitting: General Surgery

## 2021-10-30 VITALS — BP 132/86 | HR 79 | Temp 98.2°F | Ht 60.6 in | Wt 221.0 lb

## 2021-10-30 DIAGNOSIS — Z6841 Body Mass Index (BMI) 40.0 and over, adult: Secondary | ICD-10-CM | POA: Diagnosis not present

## 2021-10-30 DIAGNOSIS — I890 Lymphedema, not elsewhere classified: Secondary | ICD-10-CM | POA: Diagnosis not present

## 2021-10-30 DIAGNOSIS — I129 Hypertensive chronic kidney disease with stage 1 through stage 4 chronic kidney disease, or unspecified chronic kidney disease: Secondary | ICD-10-CM | POA: Diagnosis not present

## 2021-10-30 DIAGNOSIS — M1990 Unspecified osteoarthritis, unspecified site: Secondary | ICD-10-CM | POA: Diagnosis not present

## 2021-10-30 DIAGNOSIS — E039 Hypothyroidism, unspecified: Secondary | ICD-10-CM | POA: Diagnosis not present

## 2021-10-30 DIAGNOSIS — I872 Venous insufficiency (chronic) (peripheral): Secondary | ICD-10-CM | POA: Diagnosis not present

## 2021-10-30 DIAGNOSIS — I10 Essential (primary) hypertension: Secondary | ICD-10-CM | POA: Diagnosis not present

## 2021-10-30 DIAGNOSIS — I739 Peripheral vascular disease, unspecified: Secondary | ICD-10-CM | POA: Diagnosis not present

## 2021-10-30 DIAGNOSIS — N182 Chronic kidney disease, stage 2 (mild): Secondary | ICD-10-CM

## 2021-10-30 DIAGNOSIS — L97828 Non-pressure chronic ulcer of other part of left lower leg with other specified severity: Secondary | ICD-10-CM | POA: Diagnosis not present

## 2021-10-30 DIAGNOSIS — I87332 Chronic venous hypertension (idiopathic) with ulcer and inflammation of left lower extremity: Secondary | ICD-10-CM | POA: Diagnosis not present

## 2021-10-30 DIAGNOSIS — L97822 Non-pressure chronic ulcer of other part of left lower leg with fat layer exposed: Secondary | ICD-10-CM | POA: Diagnosis not present

## 2021-10-30 NOTE — Progress Notes (Signed)
Barnet Glasgow Martin,acting as a Education administrator for [name], MD.,have documented all relevant documentation on the behalf of [name], MD,as directed by  [name], MD while in the presence of [name], MD.    Subjective:     Patient ID: [name] , female    DOB: 1955/06/24 , 66 y.o.   MRN: [id number]   Chief Complaint  Patient presents with   Weight Check   Hypertension    HPI  Patient presents today for a weight and BP check. She reports compliance with meds. She initially had some nausea with Wegovy, but it has since resolved. She would like to continue with the medication. Patient would also like to discuss getting a handicap sticker.      Past Medical History:  Diagnosis Date   Arthritis    DOE (dyspnea on exertion)    mild   Hyperlipidemia    Hypertension    Hypothyroidism    Morbid obesity ([place])    Pre-diabetes    Schamberg disease      Family History  Problem Relation Age of Onset   Hypertension Mother    Heart Problems Mother        Pacemaker   Heart attack Father        at age 32.   Heart attack Maternal Grandfather 38       Died of AAA   AAA (abdominal aortic aneurysm) Maternal Grandfather    Heart attack Maternal Uncle    Stroke Paternal Aunt    Alzheimer's disease Maternal Grandmother      Current Outpatient Medications:    amLODipine (NORVASC) 5 MG tablet, Take 1 tablet (5 mg total) by mouth daily., Disp: 90 tablet, Rfl: 2   Calcium Carbonate-Vitamin D 600-400 MG-UNIT tablet, Take 1 tablet by mouth daily., Disp: , Rfl:    Cholecalciferol (VITAMIN D) 2000 units tablet, Take 4,000 Units by mouth daily., Disp: , Rfl:    doxycycline (VIBRA-TABS) 100 MG tablet, Take 1 tablet (100 mg total) by mouth 2 (two) times daily for 10 days, Disp: 20 tablet, Rfl: 0   levothyroxine (SYNTHROID) 112 MCG tablet, Take 1 tablet (112 mcg total) by mouth daily., Disp: 90 tablet, Rfl: 1   lisinopril (ZESTRIL) 20 MG tablet, Take 1 tablet (20 mg  total) by mouth daily., Disp: 90 tablet, Rfl: 2   Magnesium 400 MG TABS, Take by mouth., Disp: , Rfl:    Multiple Vitamins-Minerals (CENTRUM SILVER 50+WOMEN PO), Take by mouth. 1 per day, Disp: , Rfl:    potassium chloride SA (KLOR-CON) 20 MEQ tablet, Take 1 tablet (20 mEq total) by mouth daily as needed (when taking Torsemide)., Disp: 90 tablet, Rfl: 1   simvastatin (ZOCOR) 20 MG tablet, Take 1 tablet (20 mg total) by mouth daily., Disp: 90 tablet, Rfl: 0   torsemide (DEMADEX) 20 MG tablet, Take 1 tablet (20 mg total) by mouth daily as needed (for edema)., Disp: 90 tablet, Rfl: 1   Semaglutide-Weight Management (WEGOVY) 0.5 MG/0.5ML SOAJ, Inject 0.5 mg into the skin once a week. (Patient not taking: Reported on 10/30/2021), Disp: 2 mL, Rfl: 1   No Known Allergies   Review of Systems  Constitutional: Negative.   Respiratory: Negative.    Cardiovascular: Negative.   Gastrointestinal: Negative.   Neurological: Negative.   Psychiatric/Behavioral: Negative.       Today's Vitals   10/30/21 1622 10/30/21 1641  BP: (!) 160/76 132/86  Pulse: 79   Temp: 98.2  F (36.8 C)   Weight: 221 lb (100.2 kg)   Height: 5' 0.6" (1.539 m)   PainSc: 0-No pain    Body mass index is 42.31 kg/m.  Wt Readings from Last 3 Encounters:  10/30/21 221 lb (100.2 kg)  08/31/21 220 lb (99.8 kg)  07/27/21 233 lb (105.7 kg)     Objective:  Physical Exam Vitals and nursing note reviewed.  Constitutional:      Appearance: Normal appearance. She is obese.  HENT:     Head: Normocephalic and atraumatic.  Eyes:     Extraocular Movements: Extraocular movements intact.  Cardiovascular:     Rate and Rhythm: Normal rate and regular rhythm.     Heart sounds: Murmur heard.  Pulmonary:     Effort: Pulmonary effort is normal.     Breath sounds: Normal breath sounds.  Skin:    General: Skin is warm.  Neurological:     General: No focal deficit present.     Mental Status: She is alert.  Psychiatric:        Mood  and Affect: Mood normal.        Behavior: Behavior normal.       Assessment And Plan:     1. Class 3 severe obesity due to excess calories with serious comorbidity and body mass index (BMI) of 40.0 to 44.9 in adult [facility]) Comments: She has gained one lb since July 2023. She will start 0.'5mg'$  semaglutide and take for four weeks. At that time, will consider 0.'75mg'$  weekly if 0.'5mg'$  is tolerated. She will f/u in 4-6 weeks.   2. Parenchymal renal hypertension, stage 1 through stage 4 or unspecified chronic kidney disease Comments: Chronic, fair control. Goal BP<130/80. She will continue w/ current meds. Encouraged to follow low sodium diet.   3. Chronic renal disease, stage II Comments: Chronic, encouraged to stay well hydrated and avoid NSAIDs.    Patient was given opportunity to ask questions. Patient verbalized understanding of the plan and was able to repeat key elements of the plan. All questions were answered to their satisfaction.   I, [name], MD, have reviewed all documentation for this visit. The documentation on 10/30/21 for the exam, diagnosis, procedures, and orders are all accurate and complete.   IF YOU HAVE BEEN REFERRED TO A SPECIALIST, IT MAY TAKE 1-2 WEEKS TO SCHEDULE/PROCESS THE REFERRAL. IF YOU HAVE NOT HEARD FROM US/SPECIALIST IN TWO WEEKS, PLEASE GIVE [name] A CALL AT [phone] X [phone].   THE PATIENT IS ENCOURAGED TO PRACTICE SOCIAL DISTANCING DUE TO THE COVID-19 PANDEMIC.

## 2021-10-30 NOTE — Progress Notes (Signed)
SHIRELY, TOREN ([id number]) Visit Report for 10/30/2021 Arrival Information Details Patient Name: Date of Service: ZURISADAI, HELMINIAK 10/30/2021 2:15 PM Medical Record Number: [id number] Patient Account Number: [id number] Date of Birth/Sex: Treating RN: [birth year] (66 y.o. [name] Primary Care Hallis Meditz: [name] Other Clinician: Referring Nicosha Struve: Treating Corrinna Karapetyan/Extender: [name] in Treatment: 34 Visit Information History Since Last Visit Added or deleted any medications: No Patient Arrived: [name] Any new allergies or adverse reactions: No Arrival Time: 14:48 Had a fall or experienced change in No Accompanied By: self activities of daily living that may affect Transfer Assistance: None risk of falls: Patient Requires Transmission-Based Precautions: No Signs or symptoms of abuse/neglect since last visito No Patient Has Alerts: No Hospitalized since last visit: No Implantable device outside of the clinic excluding No cellular tissue based products placed in the center since last visit: Has Dressing in Place as Prescribed: Yes Has Compression in Place as Prescribed: Yes Pain Present Now: No Electronic Signature(s) Signed: 10/30/2021 6:25:10 PM By: [name] RN Entered By: [name] on 10/30/2021 15:04:52 -------------------------------------------------------------------------------- Compression Therapy Details Patient Name: Date of Service: [name] 10/30/2021 2:15 PM Medical Record Number: [id number] Patient Account Number: [id number] Date of Birth/Sex: Treating RN: [birth year] (67 y.o. [name] Primary Care Haneef Hallquist: [name] Other Clinician: Referring Asees Manfredi: Treating Primrose Oler/Extender: [name] in Treatment: 35 Compression Therapy Performed for Wound Assessment: Wound #3 Left,Lateral Lower Leg Performed By: Clinician [name], RN Compression  Type: Four Layer Post Procedure Diagnosis Same as Pre-procedure Electronic Signature(s) Signed: 10/30/2021 6:25:10 PM By: [name] RN Entered By: [name] on 10/30/2021 15:11:34 -------------------------------------------------------------------------------- Encounter Discharge Information Details Patient Name: Date of Service: [name] 10/30/2021 2:15 PM Medical Record Number: [id number] Patient Account Number: [id number] Date of Birth/Sex: Treating RN: 1955-02-23 (66 y.o. [name] Primary Care Leamon Palau: [name] Other Clinician: Referring Ermagene [name]: Treating Morayo Leven/Extender: [name] in Treatment: [phone] Encounter Discharge Information Items Post Procedure Vitals Discharge Condition: Stable Temperature (F): 97.9 Ambulatory Status: Cane Pulse (bpm): 78 Discharge Destination: Home Respiratory Rate (breaths/min): 18 Transportation: Private Auto Blood Pressure (mmHg): 144/82 Accompanied By: self Schedule Follow-up Appointment: Yes Clinical Summary of Care: Patient Declined Electronic Signature(s) Signed: 10/30/2021 6:25:10 PM By: [name] RN Entered By: [name] on 10/30/2021 17:33:54 -------------------------------------------------------------------------------- Lower Extremity Assessment Details Patient Name: Date of Service: BERNISE, SYLVAIN 10/30/2021 2:15 PM Medical Record Number: [id number] Patient Account Number: [id number] Date of Birth/Sex: Treating RN: 1955-07-02 (66 y.o. [name] Primary Care Zamani Crocker: [name] Other Clinician: Referring Maeleigh Buschman: Treating Connery Shiffler/Extender: [name] in Treatment: 35 Edema Assessment Assessed: [Left: No] [Right: No] Edema: [Left: Ye] [Right: s] Calf Left: Right: Point of Measurement: 32 cm From Medial Instep 45 cm 44.9 cm Ankle Left: Right: Point of Measurement: 10 cm From Medial Instep 22.9 cm  25 cm Knee To Floor Left: Right: From Medial Instep 41 cm Electronic Signature(s) Signed: 10/30/2021 6:25:10 PM By: [name] RN Entered By: [name] on 10/30/2021 15:08:51 -------------------------------------------------------------------------------- Multi Wound Chart Details Patient Name: Date of Service: [name] 10/30/2021 2:15 PM Medical Record Number: [id number] Patient Account Number: [id number] Date of Birth/Sex: Treating RN: 06-25-1955 (66 y.o. [name] Primary Care Kaylan Yates: [name] Other Clinician: Referring Yeraldin Litzenberger: Treating Camarie Mctigue/Extender: [name] in Treatment: 35 Vital Signs Height(in): 62 Pulse(bpm): 78 Weight(lbs): 226 Blood Pressure(mmHg): 144/82 Body Mass Index(BMI): 41.3 Temperature(F):  97.9 Respiratory Rate(breaths/min): 18 Photos: [N/A:N/A] Left, Lateral Lower Leg N/A N/A Wound Location: Blister N/A N/A Wounding Event: Venous Leg Ulcer N/A N/A Primary Etiology: Cataracts, Lymphedema, N/A N/A Comorbid History: Hypertension, Peripheral Venous Disease, Osteoarthritis 12/13/2020 N/A N/A Date Acquired: 47 N/A N/A Weeks of Treatment: Open N/A N/A Wound Status: No N/A N/A Wound Recurrence: 0.1x0.1x0.1 N/A N/A Measurements L x W x D (cm) 0.008 N/A N/A A (cm) : rea 0.001 N/A N/A Volume (cm) : 100.00% N/A N/A % Reduction in A rea: 100.00% N/A N/A % Reduction in Volume: Full Thickness Without Exposed N/A N/A Classification: Support Structures Medium N/A N/A Exudate A mount: Serosanguineous N/A N/A Exudate Type: red, brown N/A N/A Exudate Color: Distinct, outline attached N/A N/A Wound Margin: Medium (34-66%) N/A N/A Granulation A mount: Pink N/A N/A Granulation Quality: Medium (34-66%) N/A N/A Necrotic A mount: Fat Layer (Subcutaneous Tissue): Yes N/A N/A Exposed Structures: Fascia: No Tendon: No Muscle: No Joint: No Bone: No Medium (34-66%) N/A  N/A Epithelialization: Debridement - Selective/Open Wound N/A N/A Debridement: Pre-procedure Verification/Time Out 15:03 N/A N/A Taken: Lidocaine 5% topical ointment N/A N/A Pain Control: Slough N/A N/A Tissue Debrided: Non-Viable Tissue N/A N/A Level: 0.01 N/A N/A Debridement A (sq cm): rea Curette N/A N/A Instrument: Minimum N/A N/A Bleeding: Pressure N/A N/A Hemostasis A chieved: 0 N/A N/A Procedural Pain: 0 N/A N/A Post Procedural Pain: Procedure was tolerated well N/A N/A Debridement Treatment Response: 0.1x0.1x0.1 N/A N/A Post Debridement Measurements L x W x D (cm) 0.001 N/A N/A Post Debridement Volume: (cm) Compression Therapy N/A N/A Procedures Performed: Debridement Treatment Notes Electronic Signature(s) Signed: 10/30/2021 3:18:49 PM By: [name] MD FACS Signed: 10/30/2021 6:25:10 PM By: [name] RN Entered By: [name] on 10/30/2021 15:18:49 -------------------------------------------------------------------------------- Multi-Disciplinary Care Plan Details Patient Name: Date of Service: [name] 10/30/2021 2:15 PM Medical Record Number: [id number] Patient Account Number: [id number] Date of Birth/Sex: Treating RN: [birth year] (66 y.o. [name] Primary Care Kiasia Chou: [name] Other Clinician: Referring Kaizlee Carlino: Treating Lex Linhares/Extender: [name] in Treatment: 40 Multidisciplinary Care Plan reviewed with physician Active Inactive Venous Leg Ulcer Nursing Diagnoses: Actual venous Insuffiency (use after diagnosis is confirmed) Goals: Patient will maintain optimal edema control Date Initiated: 02/21/2021 Target Resolution Date: 12/12/2021 Goal Status: Active Patient/caregiver will verbalize understanding of disease process and disease management Date Initiated: 02/21/2021 Date Inactivated: 03/29/2021 Target Resolution Date: 03/24/2021 Goal Status:  Met Interventions: Assess peripheral edema status every visit. Compression as ordered Provide education on venous insufficiency Notes: Wound/Skin Impairment Nursing Diagnoses: Impaired tissue integrity Knowledge deficit related to ulceration/compromised skin integrity Goals: Patient/caregiver will verbalize understanding of skin care regimen Date Initiated: 02/21/2021 Target Resolution Date: 12/12/2021 Goal Status: Active Ulcer/skin breakdown will have a volume reduction of 30% by week 4 Date Initiated: 02/21/2021 Date Inactivated: 03/29/2021 Target Resolution Date: 03/24/2021 Goal Status: Unmet Unmet Reason: infection Ulcer/skin breakdown will have a volume reduction of 50% by week 8 Date Initiated: 03/29/2021 Date Inactivated: 04/19/2021 Target Resolution Date: 04/21/2021 Goal Status: Met Interventions: Assess patient/caregiver ability to obtain necessary supplies Assess patient/caregiver ability to perform ulcer/skin care regimen upon admission and as needed Assess ulceration(s) every visit Provide education on ulcer and skin care Notes: Electronic Signature(s) Signed: 10/30/2021 6:25:10 PM By: [name] RN Entered By: [name] on 10/30/2021 17:32:48 -------------------------------------------------------------------------------- Pain Assessment Details Patient Name: Date of Service: [name] 10/30/2021 2:15 PM Medical Record Number: [id number] Patient Account Number: [id number] Date of Birth/Sex: Treating RN: 08/04/1955 (  66 y.o. [name] Primary Care Anaise Sterbenz: [name] Other Clinician: Referring Takela Varden: Treating [name] [name]/Extender: [name] in Treatment: 35 Active Problems Location of Pain Severity and Description of Pain Patient Has Paino No Site Locations Pain Management and Medication Current Pain Management: Electronic Signature(s) Signed: 10/30/2021 6:25:10 PM By: [name] RN Entered By:  [name] on 10/30/2021 15:08:40 -------------------------------------------------------------------------------- Patient/Caregiver Education Details Patient Name: Date of Service: [name] 9/18/2023andnbsp2:15 PM Medical Record Number: [id number] Patient Account Number: [id number] Date of Birth/Gender: Treating RN: 1956/01/14 (66 y.o. [name] Primary Care Physician: [name] Other Clinician: Referring Physician: Treating Physician/Extender: [name] in Treatment: 35 Education Assessment Education Provided To: Patient Education Topics Provided Wound Debridement: Methods: Explain/Verbal Responses: Return demonstration correctly Electronic Signature(s) Signed: 10/30/2021 6:25:10 PM By: [name] RN Entered By: [name] on 10/30/2021 17:33:00 -------------------------------------------------------------------------------- Wound Assessment Details Patient Name: Date of Service: IRMALEE, RIEMENSCHNEIDER D. 10/30/2021 2:15 PM Medical Record Number: [id number] Patient Account Number: [id number] Date of Birth/Sex: Treating RN: [birth year] (66 y.o. [name] Primary Care Donevin Sainsbury: [name] Other Clinician: Referring Mykhia Danish: Treating Yoav Okane/Extender: [name] in Treatment: 35 Wound Status Wound Number: 3 Primary Venous Leg Ulcer Etiology: Wound Location: Left, Lateral Lower Leg Wound Open Wounding Event: Blister Status: Date Acquired: 12/13/2020 Comorbid Cataracts, Lymphedema, Hypertension, Peripheral Venous Weeks Of Treatment: 35 History: Disease, Osteoarthritis Clustered Wound: No Photos Wound Measurements Length: (cm) 0.1 Width: (cm) 0.1 Depth: (cm) 0.1 Area: (cm) 0.008 Volume: (cm) 0.001 % Reduction in Area: 100% % Reduction in Volume: 100% Epithelialization: Medium (34-66%) Tunneling: No Undermining: No Wound Description Classification: Full  Thickness Without Exposed Support Structures Wound Margin: Distinct, outline attached Exudate Amount: Medium Exudate Type: Serosanguineous Exudate Color: red, brown Foul Odor After Cleansing: No Slough/Fibrino Yes Wound Bed Granulation Amount: Medium (34-66%) Exposed Structure Granulation Quality: Pink Fascia Exposed: No Necrotic Amount: Medium (34-66%) Fat Layer (Subcutaneous Tissue) Exposed: Yes Necrotic Quality: Adherent Slough Tendon Exposed: No Muscle Exposed: No Joint Exposed: No Bone Exposed: No Treatment Notes Wound #3 (Lower Leg) Wound Laterality: Left, Lateral Cleanser Soap and Water Discharge Instruction: May shower and wash wound with dial antibacterial soap and water prior to dressing change. Wound Cleanser Discharge Instruction: Cleanse the wound with wound cleanser prior to applying a clean dressing using gauze sponges, not tissue or cotton balls. Peri-Wound Care Sween Lotion (Moisturizing lotion) Discharge Instruction: Apply moisturizing lotion as directed Topical Primary Dressing Endoform 2x2 in Discharge Instruction: Moisten with saline Secondary Dressing Zetuvit Plus 4x8 in Discharge Instruction: Apply over primary dressing as directed. Secured With Compression Wrap FourPress (4 layer compression wrap) Discharge Instruction: Apply four layer compression as directed. May also use Miliken CoFlex 2 layer compression system as alternative. Netting,tubular #5 Compression Stockings Add-Ons Electronic Signature(s) Signed: 10/30/2021 6:25:10 PM By: [name] RN Entered By: [name] on 10/30/2021 15:03:30 -------------------------------------------------------------------------------- Vitals Details Patient Name: Date of Service: [name] 10/30/2021 2:15 PM Medical Record Number: [id number] Patient Account Number: [id number] Date of Birth/Sex: Treating RN: 1955/02/17 (66 y.o. [name] Primary Care Jancarlos Thrun: [name] Other Clinician: Referring Griffith Santilli: Treating Braelynn Lupton/Extender: [name] in Treatment: 35 Vital Signs Time Taken: 15:00 Temperature (F): 97.9 Height (in): 62 Pulse (bpm): 78 Weight (lbs): 226 Respiratory Rate (breaths/min): 18 Body Mass Index (BMI): 41.3 Blood Pressure (mmHg): 144/82 Reference Range: 80 - 120 mg / dl Electronic Signature(s) Signed: 10/30/2021 6:25:10 PM By: [name] RN Entered  By: [name] on 10/30/2021 15:08:33

## 2021-10-30 NOTE — Patient Instructions (Signed)
Obesity, Adult ?Obesity is having too much body fat. Being obese means that your weight is more than what is healthy for you.  ?BMI (body mass index) is a number that explains how much body fat you have. If you have a BMI of 30 or more, you are obese. ?Obesity can cause serious health problems, such as: ?Stroke. ?Coronary artery disease (CAD). ?Type 2 diabetes. ?Some types of cancer. ?High blood pressure (hypertension). ?High cholesterol. ?Gallbladder stones. ?Obesity can also contribute to: ?Osteoarthritis. ?Sleep apnea. ?Infertility problems. ?What are the causes? ?Eating meals each day that are high in calories, sugar, and fat. ?Drinking a lot of drinks that have sugar in them. ?Being born with genes that may make you more likely to become obese. ?Having a medical condition that causes obesity. ?Taking certain medicines. ?Sitting a lot (having a sedentary lifestyle). ?Not getting enough sleep. ?What increases the risk? ?Having a family history of obesity. ?Living in an area with limited access to: ?Parks, recreation centers, or sidewalks. ?Healthy food choices, such as grocery stores and farmers' markets. ?What are the signs or symptoms? ?The main sign is having too much body fat. ?How is this treated? ?Treatment for this condition often includes changing your lifestyle. Treatment may include: ?Changing your diet. This may include making a healthy meal plan. ?Exercise. This may include activity that causes your heart to beat faster (aerobic exercise) and strength training. Work with your doctor to design a program that works for you. ?Medicine to help you lose weight. This may be used if you are not able to lose one pound a week after 6 weeks of healthy eating and more exercise. ?Treating conditions that cause the obesity. ?Surgery. Options may include gastric banding and gastric bypass. This may be done if: ?Other treatments have not helped to improve your condition. ?You have a BMI of 40 or higher. ?You have  life-threatening health problems related to obesity. ?Follow these instructions at home: ?Eating and drinking ? ?Follow advice from your doctor about what to eat and drink. Your doctor may tell you to: ?Limit fast food, sweets, and processed snack foods. ?Choose low-fat options. For example, choose low-fat milk instead of whole milk. ?Eat five or more servings of fruits or vegetables each day. ?Eat at home more often. This gives you more control over what you eat. ?Choose healthy foods when you eat out. ?Learn to read food labels. This will help you learn how much food is in one serving. ?Keep low-fat snacks available. ?Avoid drinks that have a lot of sugar in them. These include soda, fruit juice, iced tea with sugar, and flavored milk. ?Drink enough water to keep your pee (urine) pale yellow. ?Do not go on fad diets. ?Physical activity ?Exercise often, as told by your doctor. Most adults should get up to 150 minutes of moderate-intensity exercise every week.Ask your doctor: ?What types of exercise are safe for you. ?How often you should exercise. ?Warm up and stretch before being active. ?Do slow stretching after being active (cool down). ?Rest between times of being active. ?Lifestyle ?Work with your doctor and a food expert (dietitian) to set a weight-loss goal that is best for you. ?Limit your screen time. ?Find ways to reward yourself that do not involve food. ?Do not drink alcohol if: ?Your doctor tells you not to drink. ?You are pregnant, may be pregnant, or are planning to become pregnant. ?If you drink alcohol: ?Limit how much you have to: ?0-1 drink a day for women. ?0-2 drinks   a day for men. ?Know how much alcohol is in your drink. In the [place] one drink equals one 12 oz bottle of beer (355 mL), one 5 oz glass of wine (148 mL), or one 1? oz glass of hard liquor (44 mL). ?General instructions ?Keep a weight-loss journal. This can help you keep track of: ?The food that you eat. ?How much exercise you  get. ?Take over-the-counter and prescription medicines only as told by your doctor. ?Take vitamins and supplements only as told by your doctor. ?Think about joining a support group. ?Pay attention to your mental health as obesity can lead to depression or self esteem issues. ?Keep all follow-up visits. ?Contact a doctor if: ?You cannot meet your weight-loss goal after you have changed your diet and lifestyle for 6 weeks. ?You are having trouble breathing. ?Summary ?Obesity is having too much body fat. ?Being obese means that your weight is more than what is healthy for you. ?Work with your doctor to set a weight-loss goal. ?Get regular exercise as told by your doctor. ?This information is not intended to replace advice given to you by your health care provider. Make sure you discuss any questions you have with your health care provider. ?Document Revised: 09/06/2020 Document Reviewed: 09/06/2020 ?Elsevier Patient Education ? 2023 [employer] ? ?

## 2021-10-30 NOTE — Progress Notes (Addendum)
[name], [name] ([id number]) Visit Report for 10/30/2021 Chief Complaint Document Details Patient Name: Date of Service: [name], [name] 10/30/2021 2:15 PM Medical Record Number: [id number] Patient Account Number: [id number] Date of Birth/Sex: Treating RN: 1955/02/16 (66 y.o. [name] Primary Care Provider: [name] Other Clinician: Referring Provider: Treating Provider/Extender: [name] in Treatment: 65 Information Obtained from: Patient Chief Complaint 04/19/2021: The patient is here for ongoing follow-up regarding 2 left lower extremity wounds. Electronic Signature(s) Signed: 10/30/2021 3:18:55 PM By: [name] MD FACS Entered By: [name] on 10/30/2021 15:18:55 -------------------------------------------------------------------------------- Debridement Details Patient Name: Date of Service: Erin Gerold [name] 10/30/2021 2:15 PM Medical Record Number: [id number] Patient Account Number: [id number] Date of Birth/Sex: Treating RN: [birth year] (66 y.o. [name] Primary Care Provider: [name] Other Clinician: Referring Provider: Treating Provider/Extender: [name] in Treatment: 35 Debridement Performed for Assessment: Wound #3 Left,Lateral Lower Leg Performed By: Physician [name], MD Debridement Type: Debridement Severity of Tissue Pre Debridement: Fat layer exposed Level of Consciousness (Pre-procedure): Awake and Alert Pre-procedure Verification/Time Out Yes - 15:03 Taken: Start Time: 15:03 Pain Control: Lidocaine 5% topical ointment T Area Debrided (L x W): otal 0.1 (cm) x 0.1 (cm) = 0.01 (cm) Tissue and other material debrided: Non-Viable, Slough, Slough Level: Non-Viable Tissue Debridement Description: Selective/Open Wound Instrument: Curette Bleeding: Minimum Hemostasis Achieved: Pressure End Time: 15:04 Procedural Pain: 0 Post Procedural Pain:  0 Response to Treatment: Procedure was tolerated well Level of Consciousness (Post- Awake and Alert procedure): Post Debridement Measurements of Total Wound Length: (cm) 0.1 Width: (cm) 0.1 Depth: (cm) 0.1 Volume: (cm) 0.001 Character of Wound/Ulcer Post Debridement: Improved Severity of Tissue Post Debridement: Fat layer exposed Post Procedure Diagnosis Same as Pre-procedure Notes Scribed for Dr. Celine Ahr by J.[name] Electronic Signature(s) Signed: 10/30/2021 3:31:57 PM By: [name] MD FACS Signed: 10/30/2021 6:25:10 PM By: [name] RN Entered By: [name] on 10/30/2021 15:11:08 -------------------------------------------------------------------------------- HPI Details Patient Name: Date of Service: Erin Gerold [name] 10/30/2021 2:15 PM Medical Record Number: [id number] Patient Account Number: [id number] Date of Birth/Sex: Treating RN: [birth year] (66 y.o. 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 She tells [name] that she fell on the gravel driveway in July. She developed injuries on her distal lower leg which have not healed. She saw her primary physician on 11/15/2017 who noted her left shin injuries. Gave her antibiotics. At that point the wounds were almost circumferential however most were less than 1.5 cm. Weeping edema fluid was noted. She was referred here for evaluation. The patient has a history of chronic lower extremity edema. 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 She has had prior history with leg weeping fluid. She does not wear compression stockings. She is not  doing anything specific to these wound areas. 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 She also has lymphedema. Put her in 3 layer compression and silver alginate last  week. She has 3 small wounds with depth just lateral to the tibia. More problematically than this she has numerous shallow areas some of which are almost canal like in shape with tightly adherent painful debris. It would be very difficult and time- consuming to go through this and attempt to individually debride all these areas.. I changed her to collagen today to see if that would help with any of the surface debris on some of these wounds. Otherwise we will not be able to put this in compression we have to have someone change the dressing. The patient is not eligible for home health 12/25/17 on evaluation today patient actually appears to be doing rather well in regard to the ulcer on her lower extremity. Fortunately there does not appear to be evidence of infection at this time. She has been tolerating the dressing changes without complication. This includes the compression wrap. The only issue she had was that the wrap was initially placed over her bunion region which actually calls her some discomfort and pain. Other than that things seem to be going rather well. 01/01/2018 Seen today for follow-up and management of left lower extremity wound and lymphedema. T oday she presents with a new wound towards to the left lateral LE. Recently treated with a 7 day course of amoxicillin; reason for antibiotic dose is unknown at this time. Tolerating current treatment of collagen with 4- layer wraps. She obtained a venous reflux study on 12/25/17. Studies show on  the right abnormal reflux times of the popliteal vein, great saphenous vein at the saphenofemoral junction at the proximal thigh, great saphenous vein at the mid calf, and origin of the small saphenous vein.No superficial thrombosis. No deep vein thrombosis in the common femoral, femoral,and popliteal veins. Left abnormal reflex times as well of the common femoral vein, popliteal vein, and a great saphenous vein at the saphenofemoral junction, In great saphenous vein at the mid thigh w/o thrombosis. Has any issues or concerns during visit today. Recommended follow-up to vascular specialist due to abnormalities from the venous reflux study. Denies fever, pain, chills, dizziness, nausea, or vomiting. 01/08/18 upon evaluation today the patient actually seems to be showing some signs of improvement in my opinion at this point in regard to the lower extremity ulcerated areas. She still has a lot of drainage but fortunately nothing that appears to be too significant currently. I have been very happy with the overall progress I see today compared to where things were during the last evaluation that I had with her. Nonetheless she has not had her appointment with the vein specialist as of yet in fact we were able to get this approved for her today and confirmed with them she will be seeing them on December 26. Nonetheless in general I do feel like the compression wraps is doing well for her. 01/17/18; quite a bit of improvement since last time I saw this patient she has a small open area remaining on the left lateral calf and even smaller area medially. She has lymphedema chronic stasis changes with distal skin fibrosis. She has an appointment with vascular surgery later this month 01/24/2018; the patient's medial leg has closed. Still a small open area on the left lateral leg. She states that the 4 layer compression we put on last week was too tight and she had to take it off over a few days ago. We have had  resultant increase in her lymphedema in the dorsal foot and a proximal calf. Fortunately  that does not seem to have resulted in any deterioration in her wounds The patient is going to need compression stockings. We have given her measurements to phone elastic therapy in [place]. She has vascular surgery consult on December 26 02/07/18; she's had continuous contraction on the left lateral leg wound which is now very small. Currently using silver alginate under 3 layer compression She saw Dr. Trula Slade of vascular surgery on 02/06/18. It was noted that she had a normal reflux times in the popliteal vein, great saphenous vein at the saphenofemoral junction, great saphenous vein at the proximal thigh great saphenous vein at the mid calf and origin of the small saphenous vein. It was noted that she had significant reflux in the left saphenous veins with diameter measurements in the 0.7-0.8 cm range. It was felt she would benefit from laser ablation to help minimize the risk of ulcer recurrence. It was recommended that she wear 20-30 thigh-high compression stockings and have follow-up in 4-6 weeks 02/17/2018; I thought this lady would be healed however her compression slipped down and she developed increasing swelling and the wound is actually larger. 1/13; we had deterioration last week after the patient's compression slipped down and she developed periwound swelling. I increased her compression before layers we have been using silver alginate we are a lot better again today. She has her compression stockings in waiting 1/23; the patient's wounds are totally healed today. She has her stockings. This was almost circumferential skin damage. She follows up with Dr. Trula Slade of vascular surgery next Monday. READMISSION 02/21/2021 This is a now 66 year old woman that we had in clinic here discharging in January 2020 with wounds on her left calf chronic venous insufficiency. She was discharged with 30/40  stockings. It does not sound like she has worn stockings in about a year largely from not being able to get them on herself. In November she developed new blisters on her legs an area laterally is opened into a fairly sizable wound. She has weeping posteriorly as well. She has been using Neosporin and Band-Aids. The patient did see Dr. Trula Slade in 2019 and 2020. He felt she might benefit from laser ablation in her left saphenous veins although because of the wound that healed I do not think he went through with it. She might benefit from seeing him again. Her ABI on the left is 1. 1/17; patient's wound on the posterior left calf is closed she has the 2 large areas last week. We put her in 4-layer compression for edema control is a lot better. Our intake nurse noted greenish drainage and odor. We have been using silver alginate 1/25; PCR culture I did have the substantial wound area on the left lateral lower leg showed staff aureus and group A strep. Low titers of coag negative staph which are probably skin contaminants. Resistance detected to tetracycline methicillin and macrolides. I gave her a starter kit of Nuzyra 150 mg x 3 for 2 days then 300 mg for a further 7 days. Marked odor considerable increase in surrounding erythema. We are using Iodoflex last week however I have changed to silver alginate with underlying Bactroban 2/1; she is completing her [place] tomorrow. The degree of erythema around the wounds looks a lot better. Odor has improved. I gave her silver alginate and Bactroban last week and changing her back to Iodoflex to continue with ongoing debridement 2/8; the periwound looks a lot better. Surface of the wound also looks somewhat better although there is still ongoing  debridement to be done we have been using Iodoflex under compression. Primary dressing and silver alginate 2/15; left posterior calf. Some improvement in the surface of the wound but still very gritty we have been using  Iodoflex under compression. 04/05/2021: Left lateral posterior calf. She continues to have significant amounts of drainage, some of which is probably comprised of the Iodoflex microbleeds. There is no odor to the drainage. The satellite lesion on the more posterior aspect of the calf is epithelializing nicely and has contracted quite a bit. There is robust granulation tissue in the dominant wound with minimal adherent slough. 04/12/2021: The satellite lesion has nearly closed. She continues to have good granulation tissue with minimal slough of the larger primary wound. She continues to have a fair amount of drainage, but I think this is less secondary to switching her dressing from Iodoflex to Prisma. 04/19/2021: The satellite lesion is almost completely epithelialized. The larger primary wound continues to contract with good granulation tissue and minimal slough. She is currently in [place] with compression. 04/26/2021: The satellite lesion has closed. The larger primary wound has contracted further and the granulation tissue is robust without being hypertrophic. Minimal slough is present. 05/03/2021: The satellite lesion remains closed. The larger primary wound has a bit of slough, but has also contracted further with good perimeter epithelialization. Good granulation tissue at the wound surface. There was some greenish drainage on the dressing when it was removed; it is not the blue-green typically associated with Pseudomonas aeruginosa, however. 05/10/2021: The primary wound continues to contract. There is minimal slough. The granulation tissue at 9:00 is a bit hypertrophic. No significant drainage. No odor. 05/17/2021: The wound is a little bit smaller today with good granulation tissue. Minimal slough. No significant drainage or odor. 05/24/2021: The wound continues to contract and has a nice base of granulation tissue. Small amount of slough. No concern for infection. 05/31/2021: For some reason, the  wound measured slightly larger today but overall it still appears to be in good condition with a nice base of granulation tissue and minimal slough. 06/07/2021: The wound is smaller today. Good granulation tissue and minimal slough. 06/14/2021: The wound is unchanged in size. She continues to accumulate some slough. Good granulation tissue on the surface. 06/20/2021: The wound is smaller today. Minimal slough with good granulation tissue. 06/27/2021: The wound on her left lateral leg is smaller today with just a bit of slough and eschar accumulation. Unfortunately, she has opened a new superficial wound on her right lower extremity. She has 3+ pitting edema to the knees on that leg and she does not wear compression stockings. 07/04/2021: In addition to the superficial wound on her right lower extremity that she opened up last week, she has opened 3 additional sites on the same leg. Her compression wraps were clearly not in correct position when she came to clinic today as they were at about the mid calf level rather than to the tibial tuberosity. She says that they slipped earlier and she had to come back on Friday to have them redone. The wound on her left lateral leg is perhaps slightly larger with some slough accumulation. 07/11/2021: The superficial wounds on her right lower extremity are nearly closed. They just have a thin layer of eschar overlying them. The wound on her left lateral leg is a little bit shallower and has a bit of slough accumulation. 07/17/2021: The right medial lower extremity leg wounds have almost completely closed; one of them just has a  tiny opening with a little bit of serous drainage. The left lateral leg wound is really unchanged. It seems to be stalled. 07/24/2021: The right leg wounds are completely closed. The left lateral leg wound is actually bigger today. It is a bit more tender. There is a minor accumulation of slough on the surface. 07/31/2021: The culture that I took last  week was positive for MRSA. We added mupirocin under the silver alginate and I prescribed doxycycline. She has been tolerating this well. The wound is smaller today but does have slough accumulation. No significant pain or drainage. 08/07/2021: She completed her course of doxycycline. The wound looks much better today. It is smaller and the periwound is less inflamed. She does have some slough accumulation on the surface. 08/14/2021: The wound continues to contract. There is slough on the wound surface, but the periwound is intact without inflammation or induration. 08/21/2021: The wound is down to just 2 small open sites. They both have a bit of slough accumulation. 08/28/2021: The wound is down to just 1 small open site with a little bit of slough and eschar accumulation. 09/04/2021: The wound continues to contract but remains open. It is clean without any slough. 09/12/2021: No real change in the overall wound dimensions, but it is flush with the surrounding skin. There is a little bit of slough accumulation. Edema control is good. 09/22/2021: The wound is smaller and even more superficial. Minimal slough accumulation. Good control of edema. 09/29/2021: The wound continues to contract. She reports that it was a little bit "twingey" during the week. It is a little bit dry on inspection today. Light accumulation of slough. Good edema control. 10/06/2021: The wound is about the same size today. There is a little slough on the surface. Edema control is good. 10/13/2021: The wound is about the same size but more superficial. A little bit of slough on the surface. 10/23/2021: The wound is slightly smaller and continues to fill in. Minimal slough on the wound surface. 10/30/2021: The wound continues to contract but has not yet closed. Light slough and eschar present. Electronic Signature(s) Signed: 10/30/2021 3:19:26 PM By: [name] MD FACS Entered By: [name] on 10/30/2021  15:19:25 -------------------------------------------------------------------------------- Physical Exam Details Patient Name: Date of Service: Erin Gerold [name] 10/30/2021 2:15 PM Medical Record Number: [id number] Patient Account Number: [id number] Date of Birth/Sex: Treating RN: 05-04-1955 (66 y.o. [name] Primary Care Provider: [name] Other Clinician: Referring Provider: Treating Provider/Extender: [name] in Treatment: 47 Constitutional Slightly hypertensive. . . . No acute distress.Marland Kitchen Respiratory Normal work of breathing on room air.. Notes 10/30/2021: The wound continues to contract but has not yet closed. Light slough and eschar present. Electronic Signature(s) Signed: 10/30/2021 3:21:06 PM By: [name] MD FACS Entered By: [name] on 10/30/2021 15:21:06 -------------------------------------------------------------------------------- Physician Orders Details Patient Name: Date of Service: Erin Gerold [name] 10/30/2021 2:15 PM Medical Record Number: [id number] Patient Account Number: [id number] Date of Birth/Sex: Treating RN: 02-26-1955 (66 y.o. [name] Primary Care Provider: [name] Other Clinician: Referring Provider: Treating Provider/Extender: [name] in Treatment: [phone] Verbal / Phone Orders: No Diagnosis Coding ICD-10 Coding Code Description [phone] Chronic venous hypertension (idiopathic) with ulcer and inflammation of left lower extremity L97.828 Non-pressure chronic ulcer of other part of left lower leg with other specified severity Follow-up Appointments ppointment in 1 week. - Dr. Celine Ahr Room 3 - Monday 11/06/21 at 10:15am Return A Anesthetic (In clinic) Topical Lidocaine  4% applied to wound bed - Used in clinic Bathing/ Shower/ Hygiene May shower with protection but do not get wound dressing(s) wet. - Ok to use [employer], can purchase at [facility],  [facility], or [employer] Edema Control - Lymphedema / SCD / Other Elevate legs to the level of the heart or above for 30 minutes daily and/or when sitting, a frequency of: - throughout the day Avoid standing for long periods of time. Exercise regularly Compression stocking or Garment 20-30 mm/Hg pressure to: - Brochure for Elastic Therapy. Leg measurements included Wound Treatment Wound #3 - Lower Leg Wound Laterality: Left, Lateral Cleanser: Soap and Water 1 x Per Week/30 Days Discharge Instructions: May shower and wash wound with dial antibacterial soap and water prior to dressing change. Cleanser: Wound Cleanser 1 x Per Week/30 Days Discharge Instructions: Cleanse the wound with wound cleanser prior to applying a clean dressing using gauze sponges, not tissue or cotton balls. Peri-Wound Care: Sween Lotion (Moisturizing lotion) 1 x Per Week/30 Days Discharge Instructions: Apply moisturizing lotion as directed Prim Dressing: Endoform 2x2 in 1 x Per Week/30 Days ary Discharge Instructions: Moisten with saline Secondary Dressing: Zetuvit Plus 4x8 in 1 x Per Week/30 Days Discharge Instructions: Apply over primary dressing as directed. Compression Wrap: FourPress (4 layer compression wrap) 1 x Per Week/30 Days Discharge Instructions: Apply four layer compression as directed. May also use [place] CoFlex 2 layer compression system as alternative. Compression Wrap: Netting,tubular #5 1 x Per Week/30 Days Electronic Signature(s) Signed: 10/30/2021 3:31:57 PM By: [name] MD FACS Entered By: [name] on 10/30/2021 15:22:42 -------------------------------------------------------------------------------- Problem List Details Patient Name: Date of Service: Erin Gerold [name] 10/30/2021 2:15 PM Medical Record Number: [id number] Patient Account Number: [id number] Date of Birth/Sex: Treating RN: [birth year] (66 y.o. [name] Primary Care Provider: [name] Other  Clinician: Referring Provider: Treating Provider/Extender: [name] in Treatment: 35 Active Problems ICD-10 Encounter Code Description Active Date MDM Diagnosis 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 (idiopathic) with ulcer and inflammation of left 02/21/2021 No Yes lower extremity L97.828 Non-pressure chronic ulcer of other part of left lower leg with other specified 02/21/2021 No Yes severity Inactive Problems ICD-10 Code Description Active Date Inactive Date L03.116 Cellulitis of left lower limb 03/08/2021 03/08/2021 L97.811 Non-pressure chronic ulcer of other part of right lower leg limited to breakdown of skin 06/27/2021 06/27/2021 Resolved Problems Electronic Signature(s) Signed: 10/30/2021 3:10:31 PM By: [name] MD FACS Entered By: [name] on 10/30/2021 15:10:31 -------------------------------------------------------------------------------- Progress Note Details Patient Name: Date of Service: Erin Gerold [name] 10/30/2021 2:15 PM Medical Record Number: [id number] Patient Account Number: [id number] Date of Birth/Sex: Treating RN: 1955/03/03 (66 y.o. [name] Primary Care Provider: [name] Other Clinician: Referring Provider: Treating Provider/Extender: [name] in Treatment: 4 Subjective Chief Complaint Information obtained from Patient 04/19/2021: The patient is here for ongoing follow-up regarding 2 left lower extremity wounds. 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 She tells [name] that she fell on the gravel driveway in July. She developed injuries on her distal lower leg which have not healed. She saw her primary physician on 11/15/2017 who noted her left shin injuries. Gave her antibiotics. At that point the wounds were almost circumferential however most were less than 1.5 cm. Weeping edema fluid  was noted. She was referred here for evaluation. The patient has a history of chronic lower extremity edema. 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 She has had prior history with leg weeping fluid. She does not wear compression stockings. She is not doing anything specific to these wound areas. 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 She also has lymphedema. Put her in 3 layer compression and silver alginate last week. She has 3 small wounds with depth just lateral to the tibia. More problematically than this she has numerous shallow areas some of which are almost canal like in shape with tightly adherent painful debris. It would be very difficult and time- consuming to go through this and attempt to individually debride all these areas.. I changed her to collagen today to see if that would help with any of the surface debris on some of these wounds. Otherwise we will not be able to put this in compression we have to have someone change the dressing. The patient is not eligible for home health 12/25/17 on evaluation today patient actually appears to be doing rather well in regard to the ulcer on her lower extremity. Fortunately there does not appear to be evidence of infection at this time. She has been tolerating the dressing changes without complication. This includes the compression wrap. The only issue she had was that the wrap was initially placed over her bunion region which actually calls her some discomfort and pain. Other than that things seem to be going rather well. 01/01/2018 Seen today for  follow-up and management of left lower extremity wound and lymphedema. T oday she presents with a new wound towards to the left lateral LE. Recently treated with a 7 day course of amoxicillin; reason for antibiotic dose is unknown at this time. Tolerating current treatment of collagen with 4- layer wraps. She obtained a venous reflux study on 12/25/17. Studies show on the right abnormal reflux times of the popliteal vein, great saphenous vein at the saphenofemoral junction at the proximal thigh, great saphenous vein at the mid calf, and origin of the small saphenous vein.No superficial thrombosis. No deep vein thrombosis in the common femoral, femoral,and popliteal veins. Left abnormal reflex times as well of the common femoral vein, popliteal vein, and a great saphenous vein at the saphenofemoral junction, In great saphenous vein at the mid thigh w/o thrombosis. Has any issues or concerns during visit today. Recommended follow-up to vascular specialist due to abnormalities from the venous reflux study. Denies fever, pain, chills, dizziness, nausea, or vomiting. 01/08/18 upon evaluation today the patient actually seems to be showing some signs of improvement in my opinion at this point in regard to the lower extremity ulcerated areas. She still has a lot of drainage but fortunately nothing that appears to be too significant currently. I have been very happy with the overall progress I see today compared to where things were during the last evaluation that I had with her. Nonetheless she has not had her appointment with the vein specialist as of yet in fact we were able to get this approved for her today and confirmed with them she will be seeing them on December 26. Nonetheless in general I do feel like the compression wraps is doing well for her. 01/17/18;  quite a bit of improvement since last time I saw this patient she has a small open area remaining on the left lateral calf and even smaller  area medially. She has lymphedema chronic stasis changes with distal skin fibrosis. She has an appointment with vascular surgery later this month 01/24/2018; the patient's medial leg has closed. Still a small open area on the left lateral leg. She states that the 4 layer compression we put on last week was too tight and she had to take it off over a few days ago. We have had resultant increase in her lymphedema in the dorsal foot and a proximal calf. Fortunately that does not seem to have resulted in any deterioration in her wounds The patient is going to need compression stockings. We have given her measurements to phone elastic therapy in [place]. She has vascular surgery consult on December 26 02/07/18; she's had continuous contraction on the left lateral leg wound which is now very small. Currently using silver alginate under 3 layer compression She saw Dr. Trula Slade of vascular surgery on 02/06/18. It was noted that she had a normal reflux times in the popliteal vein, great saphenous vein at the saphenofemoral junction, great saphenous vein at the proximal thigh great saphenous vein at the mid calf and origin of the small saphenous vein. It was noted that she had significant reflux in the left saphenous veins with diameter measurements in the 0.7-0.8 cm range. It was felt she would benefit from laser ablation to help minimize the risk of ulcer recurrence. It was recommended that she wear 20-30 thigh-high compression stockings and have follow-up in 4-6 weeks 02/17/2018; I thought this lady would be healed however her compression slipped down and she developed increasing swelling and the wound is actually larger. 1/13; we had deterioration last week after the patient's compression slipped down and she developed periwound swelling. I increased her compression before layers we have been using silver alginate we are a lot better again today. She has her compression stockings in waiting 1/23; the  patient's wounds are totally healed today. She has her stockings. This was almost circumferential skin damage. She follows up with Dr. Trula Slade of vascular surgery next Monday. READMISSION 02/21/2021 This is a now 66 year old woman that we had in clinic here discharging in January 2020 with wounds on her left calf chronic venous insufficiency. She was discharged with 30/40 stockings. It does not sound like she has worn stockings in about a year largely from not being able to get them on herself. In November she developed new blisters on her legs an area laterally is opened into a fairly sizable wound. She has weeping posteriorly as well. She has been using Neosporin and Band-Aids. The patient did see Dr. Trula Slade in 2019 and 2020. He felt she might benefit from laser ablation in her left saphenous veins although because of the wound that healed I do not think he went through with it. She might benefit from seeing him again. Her ABI on the left is 1. 1/17; patient's wound on the posterior left calf is closed she has the 2 large areas last week. We put her in 4-layer compression for edema control is a lot better. Our intake nurse noted greenish drainage and odor. We have been using silver alginate 1/25; PCR culture I did have the substantial wound area on the left lateral lower leg showed staff aureus and group A strep. Low titers of coag negative staph which are probably skin contaminants. Resistance detected to  tetracycline methicillin and macrolides. I gave her a starter kit of Nuzyra 150 mg x 3 for 2 days then 300 mg for a further 7 days. Marked odor considerable increase in surrounding erythema. We are using Iodoflex last week however I have changed to silver alginate with underlying Bactroban 2/1; she is completing her [place] tomorrow. The degree of erythema around the wounds looks a lot better. Odor has improved. I gave her silver alginate and Bactroban last week and changing her back to Iodoflex  to continue with ongoing debridement 2/8; the periwound looks a lot better. Surface of the wound also looks somewhat better although there is still ongoing debridement to be done we have been using Iodoflex under compression. Primary dressing and silver alginate 2/15; left posterior calf. Some improvement in the surface of the wound but still very gritty we have been using Iodoflex under compression. 04/05/2021: Left lateral posterior calf. She continues to have significant amounts of drainage, some of which is probably comprised of the Iodoflex microbleeds. There is no odor to the drainage. The satellite lesion on the more posterior aspect of the calf is epithelializing nicely and has contracted quite a bit. There is robust granulation tissue in the dominant wound with minimal adherent slough. 04/12/2021: The satellite lesion has nearly closed. She continues to have good granulation tissue with minimal slough of the larger primary wound. She continues to have a fair amount of drainage, but I think this is less secondary to switching her dressing from Iodoflex to Prisma. 04/19/2021: The satellite lesion is almost completely epithelialized. The larger primary wound continues to contract with good granulation tissue and minimal slough. She is currently in [place] with compression. 04/26/2021: The satellite lesion has closed. The larger primary wound has contracted further and the granulation tissue is robust without being hypertrophic. Minimal slough is present. 05/03/2021: The satellite lesion remains closed. The larger primary wound has a bit of slough, but has also contracted further with good perimeter epithelialization. Good granulation tissue at the wound surface. There was some greenish drainage on the dressing when it was removed; it is not the blue-green typically associated with Pseudomonas aeruginosa, however. 05/10/2021: The primary wound continues to contract. There is minimal slough. The  granulation tissue at 9:00 is a bit hypertrophic. No significant drainage. No odor. 05/17/2021: The wound is a little bit smaller today with good granulation tissue. Minimal slough. No significant drainage or odor. 05/24/2021: The wound continues to contract and has a nice base of granulation tissue. Small amount of slough. No concern for infection. 05/31/2021: For some reason, the wound measured slightly larger today but overall it still appears to be in good condition with a nice base of granulation tissue and minimal slough. 06/07/2021: The wound is smaller today. Good granulation tissue and minimal slough. 06/14/2021: The wound is unchanged in size. She continues to accumulate some slough. Good granulation tissue on the surface. 06/20/2021: The wound is smaller today. Minimal slough with good granulation tissue. 06/27/2021: The wound on her left lateral leg is smaller today with just a bit of slough and eschar accumulation. Unfortunately, she has opened a new superficial wound on her right lower extremity. She has 3+ pitting edema to the knees on that leg and she does not wear compression stockings. 07/04/2021: In addition to the superficial wound on her right lower extremity that she opened up last week, she has opened 3 additional sites on the same leg. Her compression wraps were clearly not in correct position when she  came to clinic today as they were at about the mid calf level rather than to the tibial tuberosity. She says that they slipped earlier and she had to come back on Friday to have them redone. The wound on her left lateral leg is perhaps slightly larger with some slough accumulation. 07/11/2021: The superficial wounds on her right lower extremity are nearly closed. They just have a thin layer of eschar overlying them. The wound on her left lateral leg is a little bit shallower and has a bit of slough accumulation. 07/17/2021: The right medial lower extremity leg wounds have almost completely  closed; one of them just has a tiny opening with a little bit of serous drainage. The left lateral leg wound is really unchanged. It seems to be stalled. 07/24/2021: The right leg wounds are completely closed. The left lateral leg wound is actually bigger today. It is a bit more tender. There is a minor accumulation of slough on the surface. 07/31/2021: The culture that I took last week was positive for MRSA. We added mupirocin under the silver alginate and I prescribed doxycycline. She has been tolerating this well. The wound is smaller today but does have slough accumulation. No significant pain or drainage. 08/07/2021: She completed her course of doxycycline. The wound looks much better today. It is smaller and the periwound is less inflamed. She does have some slough accumulation on the surface. 08/14/2021: The wound continues to contract. There is slough on the wound surface, but the periwound is intact without inflammation or induration. 08/21/2021: The wound is down to just 2 small open sites. They both have a bit of slough accumulation. 08/28/2021: The wound is down to just 1 small open site with a little bit of slough and eschar accumulation. 09/04/2021: The wound continues to contract but remains open. It is clean without any slough. 09/12/2021: No real change in the overall wound dimensions, but it is flush with the surrounding skin. There is a little bit of slough accumulation. Edema control is good. 09/22/2021: The wound is smaller and even more superficial. Minimal slough accumulation. Good control of edema. 09/29/2021: The wound continues to contract. She reports that it was a little bit "twingey" during the week. It is a little bit dry on inspection today. Light accumulation of slough. Good edema control. 10/06/2021: The wound is about the same size today. There is a little slough on the surface. Edema control is good. 10/13/2021: The wound is about the same size but more superficial. A little bit  of slough on the surface. 10/23/2021: The wound is slightly smaller and continues to fill in. Minimal slough on the wound surface. 10/30/2021: The wound continues to contract but has not yet closed. Light slough and eschar present. Patient History Information obtained from Patient. Family History Heart Disease - Mother,Father, Hypertension - Mother,Father, Kidney Disease - Mother, Thyroid Problems - Mother, No family history of Cancer, Diabetes, Hereditary Spherocytosis, Lung Disease, Seizures, Stroke, Tuberculosis. Social History Never smoker, Marital Status - Single, Alcohol Use - Never, Drug Use - No History, Caffeine Use - Daily - coffee. Medical History Eyes Patient has history of Cataracts Hematologic/Lymphatic Patient has history of Lymphedema Cardiovascular Patient has history of Hypertension, Peripheral Venous Disease Integumentary (Skin) Denies history of History of Burn Musculoskeletal Patient has history of Osteoarthritis Hospitalization/Surgery History - colostomy reversal. 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 Medical A Surgical History Notes nd Constitutional Symptoms (General Health) morbid obesity Cardiovascular schamberg disease, hyperlipidemia Gastrointestinal diverticulitis , h/o obstruction due to  diverticulitis , colostomy and colostomy reversal Endocrine hypothyroidism Objective Constitutional Slightly hypertensive. No acute distress.. Vitals Time Taken: 3:00 PM, Height: 62 in, Weight: 226 lbs, BMI: 41.3, Temperature: 97.9 F, Pulse: 78 bpm, Respiratory Rate: 18 breaths/min, Blood Pressure: 144/82 mmHg. Respiratory Normal work of breathing on room air.. General Notes: 10/30/2021: The wound continues to contract but has not yet closed. Light slough and eschar present. Integumentary (Hair, Skin) Wound #3 status is Open. Original cause of wound was Blister. The date acquired was: 12/13/2020. The wound has been in treatment 35 weeks. The wound is located on the  Left,Lateral Lower Leg. The wound measures 0.1cm length x 0.1cm width x 0.1cm depth; 0.008cm^2 area and 0.001cm^3 volume. There is Fat Layer (Subcutaneous Tissue) exposed. There is no tunneling or undermining noted. There is a medium amount of serosanguineous drainage noted. The wound margin is distinct with the outline attached to the wound base. There is medium (34-66%) pink granulation within the wound bed. There is a medium (34-66%) amount of necrotic tissue within the wound bed including Adherent Slough. 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 There was a Selective/Open Wound Non-Viable Tissue Debridement with a total area of 0.01 sq cm performed by [name], MD. With the following instrument(s): Curette to remove Non-Viable tissue/material. Material removed includes [facility] after achieving pain control using Lidocaine 5% topical ointment. No specimens were taken. A time out was conducted at 15:03, prior to the start of the procedure. A Minimum amount of bleeding was controlled with Pressure. The procedure was tolerated well with a pain level of 0 throughout and a pain level of 0 following the procedure. Post Debridement Measurements: 0.1cm length x 0.1cm width x 0.1cm depth; 0.001cm^3 volume. Character of Wound/Ulcer Post Debridement is improved. Severity of Tissue Post Debridement is: Fat layer exposed. Post procedure Diagnosis Wound #3: Same as Pre-Procedure General Notes: Scribed for Dr. Celine Ahr by J.[name]. Pre-procedure diagnosis of Wound #3 is a Venous Leg Ulcer located on the Left,Lateral Lower Leg . There was a Four Layer  Compression Therapy Procedure by [name], RN. Post procedure Diagnosis Wound #3: Same as Pre-Procedure Plan Follow-up Appointments: Return Appointment in 1 week. - Dr. Celine Ahr Room 3 - Monday 11/06/21 at 10:15am Anesthetic: (In clinic) Topical Lidocaine 4% applied to wound bed - Used in clinic Bathing/ Shower/ Hygiene: May shower with protection but do not get wound dressing(s) wet. - Ok to use Market [employer], can purchase at [facility], [facility], or [employer] Edema Control - Lymphedema / SCD / Other: Elevate legs to the level of the heart or above for 30 minutes daily and/or when sitting, a frequency of: - throughout the day Avoid standing for long periods of time. Exercise regularly Compression stocking or Garment 20-30 mm/Hg pressure to: - Brochure for Elastic Therapy. Leg measurements included WOUND #3: - Lower Leg Wound Laterality: Left, Lateral Cleanser: Soap and Water 1 x Per Week/30 Days Discharge Instructions: May shower and wash wound with dial antibacterial soap and water prior to dressing change. Cleanser: Wound Cleanser 1 x Per Week/30 Days Discharge Instructions: Cleanse the wound with wound cleanser prior to applying a clean dressing using gauze sponges, not tissue or cotton balls. Peri-Wound Care: Maude Leriche (  Moisturizing lotion) 1 x Per Week/30 Days Discharge Instructions: Apply moisturizing lotion as directed Prim Dressing: Endoform 2x2 in 1 x Per Week/30 Days ary Discharge Instructions: Moisten with saline Secondary Dressing: Zetuvit Plus 4x8 in 1 x Per Week/30 Days Discharge Instructions: Apply over primary dressing as directed. Com pression Wrap: FourPress (4 layer compression wrap) 1 x Per Week/30 Days Discharge Instructions: Apply four layer compression as directed. May also use [place] CoFlex 2 layer compression system as alternative. Com pression Wrap: Netting,tubular #5 1 x Per Week/30 Days 10/30/2021: The wound continues to contract but has not yet closed. Light  slough and eschar present. Is a curette to debride the slough and eschar. We will continue using endoform. We have been using Coflex but I am going to try true 4-layer compression to see if the additional pressure will help [name] get this wound finally closed. She will follow-up in 1 week. Electronic Signature(s) Signed: 10/30/2021 3:24:39 PM By: [name] MD FACS Entered By: [name] on 10/30/2021 15:24:39 -------------------------------------------------------------------------------- HxROS Details Patient Name: Date of Service: Erin Gerold [name] 10/30/2021 2:15 PM Medical Record Number: [id number] Patient Account Number: [id number] Date of Birth/Sex: Treating RN: [birth year] (66 y.o. [name] Primary Care Provider: [name] Other Clinician: Referring Provider: Treating Provider/Extender: [name] in Treatment: 64 Information Obtained From Patient Constitutional Symptoms (General Health) Medical History: Past Medical History Notes: morbid obesity Eyes Medical History: Positive for: Cataracts Hematologic/Lymphatic Medical History: Positive for: Lymphedema Cardiovascular Medical History: Positive for: Hypertension; Peripheral Venous Disease Past Medical History Notes: schamberg disease, hyperlipidemia Gastrointestinal Medical History: Past Medical History Notes: diverticulitis , h/o obstruction due to diverticulitis , colostomy and colostomy reversal Endocrine Medical History: Past Medical History Notes: hypothyroidism Integumentary (Skin) Medical History: Negative for: History of Burn Musculoskeletal Medical History: Positive for: Osteoarthritis HBO Extended History Items Eyes: Cataracts Immunizations Pneumococcal Vaccine: Received Pneumococcal Vaccination: Yes Received Pneumococcal Vaccination On or After 60th Birthday: Yes Implantable Devices None Hospitalization / Surgery History Type of  Hospitalization/Surgery colostomy reversal 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 Family and Social History Cancer: No; Diabetes: No; Heart Disease: Yes - Mother,Father; Hereditary Spherocytosis: No; Hypertension: Yes - Mother,Father; Kidney Disease: Yes - Mother; Lung Disease: No; Seizures: No; Stroke: No; Thyroid Problems: Yes - Mother; Tuberculosis: No; Never smoker; Marital Status - Single; Alcohol Use: Never; Drug Use: No History; Caffeine Use: Daily - coffee; Financial Concerns: No; Food, Clothing or Shelter Needs: No; Support System Lacking: No; Transportation Concerns: No Electronic Signature(s) Signed: 10/30/2021 3:31:57 PM By: [name] MD FACS Signed: 10/30/2021 6:25:10 PM By: [name] RN Entered By: [name] on 10/30/2021 15:20:42 -------------------------------------------------------------------------------- SuperBill Details Patient Name: Date of Service: [name] 10/30/2021 Medical Record Number: [id number] Patient Account Number: [id number] Date of Birth/Sex: Treating RN: 10/15/1955 (66 y.o. [name] Primary Care Provider: [name] Other Clinician: Referring Provider: Treating Provider/Extender: [name] in Treatment: 35 Diagnosis Coding ICD-10 Codes Code Description [phone] Chronic venous hypertension (idiopathic) with ulcer and inflammation of left lower extremity L97.828 Non-pressure chronic ulcer of other part of left lower leg with other specified severity Facility Procedures CPT4 Code: [id number] Description: [phone] - DEBRIDE WOUND 1ST 20 SQ CM OR < ICD-10 Diagnosis Description L97.828 Non-pressure chronic ulcer of other part of left lower leg with other specified se Modifier: verity Quantity: 1 Physician Procedures : CPT4 Code Description Modifier [id number] - WC PHYS LEVEL 3 - EST PT 25 ICD-10 Diagnosis Description L97.828 Non-pressure chronic ulcer of  other part of left lower leg with  other specified severity 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 (idiopathic)  with ulcer and inflammation of left lower extremity Quantity: 1 Electronic Signature(s) Signed: 10/30/2021 3:26:11 PM By: [name] MD FACS Entered By: [name] on 10/30/2021 15:26:11

## 2021-11-03 ENCOUNTER — Other Ambulatory Visit (HOSPITAL_COMMUNITY): Payer: Self-pay

## 2021-11-06 ENCOUNTER — Encounter (HOSPITAL_BASED_OUTPATIENT_CLINIC_OR_DEPARTMENT_OTHER): Payer: 59 | Admitting: General Surgery

## 2021-11-06 DIAGNOSIS — I87332 Chronic venous hypertension (idiopathic) with ulcer and inflammation of left lower extremity: Secondary | ICD-10-CM | POA: Diagnosis not present

## 2021-11-06 DIAGNOSIS — L97828 Non-pressure chronic ulcer of other part of left lower leg with other specified severity: Secondary | ICD-10-CM | POA: Diagnosis not present

## 2021-11-06 DIAGNOSIS — M1990 Unspecified osteoarthritis, unspecified site: Secondary | ICD-10-CM | POA: Diagnosis not present

## 2021-11-06 DIAGNOSIS — I872 Venous insufficiency (chronic) (peripheral): Secondary | ICD-10-CM | POA: Diagnosis not present

## 2021-11-06 DIAGNOSIS — I890 Lymphedema, not elsewhere classified: Secondary | ICD-10-CM | POA: Diagnosis not present

## 2021-11-06 DIAGNOSIS — I10 Essential (primary) hypertension: Secondary | ICD-10-CM | POA: Diagnosis not present

## 2021-11-06 DIAGNOSIS — E039 Hypothyroidism, unspecified: Secondary | ICD-10-CM | POA: Diagnosis not present

## 2021-11-06 DIAGNOSIS — L97822 Non-pressure chronic ulcer of other part of left lower leg with fat layer exposed: Secondary | ICD-10-CM | POA: Diagnosis not present

## 2021-11-06 DIAGNOSIS — I739 Peripheral vascular disease, unspecified: Secondary | ICD-10-CM | POA: Diagnosis not present

## 2021-11-06 NOTE — Progress Notes (Signed)
[name] ([id number]) Visit Report for 11/06/2021 Arrival Information Details Patient Name: Date of Service: [name], [name] 11/06/2021 10:15 A M Medical Record Number: [id number] Patient Account Number: [id number] Date of Birth/Sex: Treating RN: 1955-12-31 (66 y.o. [name] Primary Care Rigo Letts: [name] Other Clinician: Referring Eliz Nigg: Treating Zamauri Nez/Extender: [name] in Treatment: 36 Visit Information History Since Last Visit Added or deleted any medications: No Patient Arrived: Cane Any new allergies or adverse reactions: No Arrival Time: 10:27 Had a fall or experienced change in No Accompanied By: self activities of daily living that may affect Transfer Assistance: None risk of falls: Patient Identification Verified: Yes Signs or symptoms of abuse/neglect since last visito No Patient Requires Transmission-Based Precautions: No Hospitalized since last visit: No Patient Has Alerts: No Implantable device outside of the clinic excluding No cellular tissue based products placed in the center since last visit: Has Dressing in Place as Prescribed: Yes Has Compression in Place as Prescribed: Yes Pain Present Now: No Electronic Signature(s) Signed: 11/06/2021 5:29:08 PM By: [name] RN Entered By: [name] on 11/06/2021 10:45:25 -------------------------------------------------------------------------------- Compression Therapy Details Patient Name: Date of Service: Erin Face D. 11/06/2021 10:15 A M Medical Record Number: [id number] Patient Account Number: [id number] Date of Birth/Sex: Treating RN: 02-16-1955 (66 y.o. [name] Primary Care Falisha Osment: [name] Other Clinician: Referring Irie Fiorello: Treating Kassey Laforest/Extender: [name] in Treatment: 36 Compression Therapy Performed for Wound Assessment: Wound #3 Left,Lateral Lower Leg Performed By:  Clinician [name], RN Compression Type: Four Layer Post Procedure Diagnosis Same as Pre-procedure Electronic Signature(s) Signed: 11/06/2021 5:29:08 PM By: [name] RN Entered By: [name] on 11/06/2021 14:16:27 -------------------------------------------------------------------------------- Encounter Discharge Information Details Patient Name: Date of Service: Erin Face D. 11/06/2021 10:15 A M Medical Record Number: [id number] Patient Account Number: [id number] Date of Birth/Sex: Treating RN: 01-16-1956 (66 y.o. [name] Primary Care Edd Reppert: [name] Other Clinician: Referring Yannick Steuber: Treating Marybelle Giraldo/Extender: [name] in Treatment: 36 Encounter Discharge Information Items Post Procedure Vitals Discharge Condition: Stable Temperature (F): 98.5 Ambulatory Status: Cane Pulse (bpm): 82 Discharge Destination: Home Respiratory Rate (breaths/min): 16 Transportation: Private Auto Blood Pressure (mmHg): 130/75 Accompanied By: self Schedule Follow-up Appointment: Yes Clinical Summary of Care: Patient Declined Electronic Signature(s) Signed: 11/06/2021 5:29:08 PM By: [name] RN Entered By: [name] on 11/06/2021 17:21:07 -------------------------------------------------------------------------------- Lower Extremity Assessment Details Patient Name: Date of Service: [name], [name] 11/06/2021 10:15 A M Medical Record Number: [id number] Patient Account Number: [id number] Date of Birth/Sex: Treating RN: 1955/12/28 (66 y.o. [name] Primary Care Elton Heid: [name] Other Clinician: Referring Noal Abshier: Treating Rajah Lamba/Extender: [name] in Treatment: 36 Edema Assessment Assessed: [Left: No] [Right: No] Edema: [Left: Ye] [Right: s] Calf Left: Right: Point of Measurement: 32 cm From Medial Instep 44.2 cm 44.9 cm Ankle Left: Right: Point  of Measurement: 10 cm From Medial Instep 21.5 cm 25 cm Electronic Signature(s) Signed: 11/06/2021 5:29:08 PM By: [name] RN Entered By: [name] on 11/06/2021 10:49:34 -------------------------------------------------------------------------------- Multi Wound Chart Details Patient Name: Date of Service: Erin Face D. 11/06/2021 10:15 A M Medical Record Number: [id number] Patient Account Number: [id number] Date of Birth/Sex: Treating RN: 1955/12/24 (66 y.o. [name] Primary Care Levell Tavano: [name] Other Clinician: Referring Carlen Fils: Treating Tisa Weisel/Extender: [name] in Treatment: 36 Vital Signs Height(in): 62 Pulse(bpm): 82 Weight(lbs): 226 Blood Pressure(mmHg): 130/75 Body Mass Index(BMI): 41.3 Temperature(F): 98.5  Respiratory Rate(breaths/min): 16 Photos: [N/A:N/A] Left, Lateral Lower Leg N/A N/A Wound Location: Blister N/A N/A Wounding Event: Venous Leg Ulcer N/A N/A Primary Etiology: Cataracts, Lymphedema, N/A N/A Comorbid History: Hypertension, Peripheral Venous Disease, Osteoarthritis 12/13/2020 N/A N/A Date Acquired: 79 N/A N/A Weeks of Treatment: Open N/A N/A Wound Status: No N/A N/A Wound Recurrence: 0.1x0.1x0.1 N/A N/A Measurements L x W x D (cm) 0.008 N/A N/A A (cm) : rea 0.001 N/A N/A Volume (cm) : 100.00% N/A N/A % Reduction in Area: 100.00% N/A N/A % Reduction in Volume: Full Thickness Without Exposed N/A N/A Classification: Support Structures Medium N/A N/A Exudate Amount: Serosanguineous N/A N/A Exudate Type: red, [name] N/A N/A Exudate Color: Distinct, outline attached N/A N/A Wound Margin: Large (67-100%) N/A N/A Granulation Amount: Pink N/A N/A Granulation Quality: Small (1-33%) N/A N/A Necrotic Amount: Fat Layer (Subcutaneous Tissue): Yes N/A N/A Exposed Structures: Fascia: No Tendon: No Muscle: No Joint: No Bone: No Medium (34-66%) N/A  N/A Epithelialization: Treatment Notes Electronic Signature(s) Signed: 11/06/2021 10:58:54 AM By: [name] MD FACS Signed: 11/06/2021 5:29:08 PM By: [name] RN Entered By: [name] on 11/06/2021 10:58:54 -------------------------------------------------------------------------------- Multi-Disciplinary Care Plan Details Patient Name: Date of Service: Erin Face D. 11/06/2021 10:15 A M Medical Record Number: [id number] Patient Account Number: [id number] Date of Birth/Sex: Treating RN: 1955-08-08 (66 y.o. [name] Primary Care Red Mandt: [name] Other Clinician: Referring Ephrem Carrick: Treating Antavius Sperbeck/Extender: [name] in Treatment: 36 Multidisciplinary Care Plan reviewed with physician Active Inactive Venous Leg Ulcer Nursing Diagnoses: Actual venous Insuffiency (use after diagnosis is confirmed) Goals: Patient will maintain optimal edema control Date Initiated: 02/21/2021 Target Resolution Date: 12/12/2021 Goal Status: Active Patient/caregiver will verbalize understanding of disease process and disease management Date Initiated: 02/21/2021 Date Inactivated: 03/29/2021 Target Resolution Date: 03/24/2021 Goal Status: Met Interventions: Assess peripheral edema status every visit. Compression as ordered Provide education on venous insufficiency Notes: Wound/Skin Impairment Nursing Diagnoses: Impaired tissue integrity Knowledge deficit related to ulceration/compromised skin integrity Goals: Patient/caregiver will verbalize understanding of skin care regimen Date Initiated: 02/21/2021 Target Resolution Date: 12/12/2021 Goal Status: Active Ulcer/skin breakdown will have a volume reduction of 30% by week 4 Date Initiated: 02/21/2021 Date Inactivated: 03/29/2021 Target Resolution Date: 03/24/2021 Goal Status: Unmet Unmet Reason: infection Ulcer/skin breakdown will have a volume reduction of 50% by week  8 Date Initiated: 03/29/2021 Date Inactivated: 04/19/2021 Target Resolution Date: 04/21/2021 Goal Status: Met Interventions: Assess patient/caregiver ability to obtain necessary supplies Assess patient/caregiver ability to perform ulcer/skin care regimen upon admission and as needed Assess ulceration(s) every visit Provide education on ulcer and skin care Notes: Electronic Signature(s) Signed: 11/06/2021 5:29:08 PM By: [name] RN Entered By: [name] on 11/06/2021 17:19:54 -------------------------------------------------------------------------------- Pain Assessment Details Patient Name: Date of Service: [name], [name]. 11/06/2021 10:15 A M Medical Record Number: [id number] Patient Account Number: [id number] Date of Birth/Sex: Treating RN: Mar 09, 1955 (66 y.o. [name] Primary Care Eward Rutigliano: [name] Other Clinician: Referring Erron Wengert: Treating Suzan Manon/Extender: [name] in Treatment: 36 Active Problems Location of Pain Severity and Description of Pain Patient Has Paino No Site Locations Pain Management and Medication Current Pain Management: Electronic Signature(s) Signed: 11/06/2021 5:29:08 PM By: [name] RN Entered By: [name] on 11/06/2021 10:46:39 -------------------------------------------------------------------------------- Patient/Caregiver Education Details Patient Name: Date of Service: [name] 9/25/2023andnbsp10:15 A M Medical Record Number: [id number] Patient Account Number: [id number] Date of Birth/Gender: Treating RN: Jul 28, 1955 (66 y.o. [name] Primary Care Physician: Baird Cancer,  Robyn Other Clinician: Referring Physician: Treating Physician/Extender: [name] in Treatment: 29 Education Assessment Education Provided To: Patient Education Topics Provided Wound/Skin Impairment: Methods: Explain/Verbal Responses: Return  demonstration correctly Electronic Signature(s) Signed: 11/06/2021 5:29:08 PM By: [name] RN Entered By: [name] on 11/06/2021 17:20:06 -------------------------------------------------------------------------------- Wound Assessment Details Patient Name: Date of Service: [name], [name]. 11/06/2021 10:15 A M Medical Record Number: [id number] Patient Account Number: [id number] Date of Birth/Sex: Treating RN: 07/12/1955 (66 y.o. [name] Primary Care Janson Lamar: [name] Other Clinician: Referring Lochlin Eppinger: Treating Tyleigh Mahn/Extender: [name] in Treatment: 36 Wound Status Wound Number: 3 Primary Venous Leg Ulcer Etiology: Wound Location: Left, Lateral Lower Leg Wound Open Wounding Event: Blister Status: Date Acquired: 12/13/2020 Comorbid Cataracts, Lymphedema, Hypertension, Peripheral Venous Weeks Of Treatment: 36 History: Disease, Osteoarthritis Clustered Wound: No Photos Wound Measurements Length: (cm) 0.1 Width: (cm) 0.1 Depth: (cm) 0.1 Area: (cm) 0.008 Volume: (cm) 0.001 % Reduction in Area: 100% % Reduction in Volume: 100% Epithelialization: Medium (34-66%) Tunneling: No Undermining: No Wound Description Classification: Full Thickness Without Exposed Support Structures Wound Margin: Distinct, outline attached Exudate Amount: Medium Exudate Type: Serosanguineous Exudate Color: red, [name] Foul Odor After Cleansing: No Slough/Fibrino Yes Wound Bed Granulation Amount: Large (67-100%) Exposed Structure Granulation Quality: Pink Fascia Exposed: No Necrotic Amount: Small (1-33%) Fat Layer (Subcutaneous Tissue) Exposed: Yes Necrotic Quality: Adherent Slough Tendon Exposed: No Muscle Exposed: No Joint Exposed: No Bone Exposed: No Treatment Notes Wound #3 (Lower Leg) Wound Laterality: Left, Lateral Cleanser Soap and Water Discharge Instruction: May shower and wash wound with dial antibacterial soap  and water prior to dressing change. Wound Cleanser Discharge Instruction: Cleanse the wound with wound cleanser prior to applying a clean dressing using gauze sponges, not tissue or cotton balls. Peri-Wound Care Sween Lotion (Moisturizing lotion) Discharge Instruction: Apply moisturizing lotion as directed Topical Primary Dressing Endoform 2x2 in Discharge Instruction: Moisten with saline Secondary Dressing Zetuvit Plus 4x8 in Discharge Instruction: Apply over primary dressing as directed. Secured With Compression Wrap FourPress (4 layer compression wrap) Discharge Instruction: Apply four layer compression as directed. May also use Miliken CoFlex 2 layer compression system as alternative. Netting,tubular #5 Compression Stockings Add-Ons Electronic Signature(s) Signed: 11/06/2021 5:29:08 PM By: [name] RN Entered By: [name] on 11/06/2021 10:52:09 -------------------------------------------------------------------------------- Vitals Details Patient Name: Date of Service: Erin Face D. 11/06/2021 10:15 A M Medical Record Number: [id number] Patient Account Number: [id number] Date of Birth/Sex: Treating RN: 1955/02/25 (66 y.o. [name] Primary Care Craig Wisnewski: [name] Other Clinician: Referring Chelbie Jarnagin: Treating Maquita Sandoval/Extender: [name] in Treatment: 36 Vital Signs Time Taken: 10:45 Temperature (F): 98.5 Height (in): 62 Pulse (bpm): 82 Weight (lbs): 226 Respiratory Rate (breaths/min): 16 Body Mass Index (BMI): 41.3 Blood Pressure (mmHg): 130/75 Reference Range: 80 - 120 mg / dl Electronic Signature(s) Signed: 11/06/2021 5:29:08 PM By: [name] RN Entered By: [name] on 11/06/2021 10:46:22

## 2021-11-06 NOTE — Progress Notes (Addendum)
[name], [name] ([id number]) Visit Report for 11/06/2021 Chief Complaint Document Details Patient Name: Date of Service: [name], [name] 11/06/2021 10:15 A M Medical Record Number: [id number] Patient Account Number: [id number] Date of Birth/Sex: Treating RN: May 22, 1955 (66 y.o. [name] Primary Care Provider: [name] Other Clinician: Referring Provider: Treating Provider/Extender: [name] in Treatment: 36 Information Obtained from: Patient Chief Complaint 04/19/2021: The patient is here for ongoing follow-up regarding 2 left lower extremity wounds. Electronic Signature(s) Signed: 11/06/2021 11:00:43 AM By: [name] MD FACS Entered By: [name] on 11/06/2021 11:00:43 -------------------------------------------------------------------------------- Debridement Details Patient Name: Date of Service: Erin Face D. 11/06/2021 10:15 A M Medical Record Number: [id number] Patient Account Number: [id number] Date of Birth/Sex: Treating RN: [birth year] (66 y.o. [name] Primary Care Provider: [name] Other Clinician: Referring Provider: Treating Provider/Extender: [name] in Treatment: 36 Debridement Performed for Assessment: Wound #3 Left,Lateral Lower Leg Performed By: Physician [name], MD Debridement Type: Debridement Severity of Tissue Pre Debridement: Fat layer exposed Level of Consciousness (Pre-procedure): Awake and Alert Pre-procedure Verification/Time Out Yes - 10:56 Taken: Start Time: 10:56 T Area Debrided (L x W): otal 0.1 (cm) x 0.1 (cm) = 0.01 (cm) Tissue and other material debrided: Non-Viable, Eschar, Slough, Slough Level: Non-Viable Tissue Debridement Description: Selective/Open Wound Instrument: Curette Bleeding: Minimum Hemostasis Achieved: Pressure End Time: 10:57 Procedural Pain: 0 Post Procedural Pain: 0 Response to Treatment: Procedure  was tolerated well Level of Consciousness (Post- Awake and Alert procedure): Post Debridement Measurements of Total Wound Length: (cm) 0.1 Width: (cm) 0.1 Depth: (cm) 0.1 Volume: (cm) 0.001 Character of Wound/Ulcer Post Debridement: Improved Severity of Tissue Post Debridement: Fat layer exposed Post Procedure Diagnosis Same as Pre-procedure Notes Scribed for Dr. Celine Ahr by J.[name] Electronic Signature(s) Signed: 11/06/2021 11:51:09 AM By: [name] MD FACS Signed: 11/06/2021 5:29:08 PM By: [name] RN Entered By: [name] on 11/06/2021 10:59:39 -------------------------------------------------------------------------------- HPI Details Patient Name: Date of Service: Erin Face D. 11/06/2021 10:15 A M Medical Record Number: [id number] Patient Account Number: [id number] Date of Birth/Sex: Treating RN: Nov 19, 1955 (66 y.o. 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 She tells [name] that she fell on the gravel driveway in July. She developed injuries on her distal lower leg which have not healed. She saw her primary physician on 11/15/2017 who noted her left shin injuries. Gave her antibiotics. At that point the wounds were almost circumferential however most were less than 1.5 cm. Weeping edema fluid was noted. She was referred here for evaluation. The patient has a history of chronic lower extremity edema. 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 She has had prior history with leg weeping fluid. She does not wear compression stockings. She is not doing anything specific to these  wound areas. 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 She also has lymphedema. Put her in 3 layer compression and silver alginate last week. She  has 3 small wounds with depth just lateral to the tibia. More problematically than this she has numerous shallow areas some of which are almost canal like in shape with tightly adherent painful debris. It would be very difficult and time- consuming to go through this and attempt to individually debride all these areas.. I changed her to collagen today to see if that would help with any of the surface debris on some of these wounds. Otherwise we will not be able to put this in compression we have to have someone change the dressing. The patient is not eligible for home health 12/25/17 on evaluation today patient actually appears to be doing rather well in regard to the ulcer on her lower extremity. Fortunately there does not appear to be evidence of infection at this time. She has been tolerating the dressing changes without complication. This includes the compression wrap. The only issue she had was that the wrap was initially placed over her bunion region which actually calls her some discomfort and pain. Other than that things seem to be going rather well. 01/01/2018 Seen today for follow-up and management of left lower extremity wound and lymphedema. T oday she presents with a new wound towards to the left lateral LE. Recently treated with a 7 day course of amoxicillin; reason for antibiotic dose is unknown at this time. Tolerating current treatment of collagen with 4- layer wraps. She obtained a venous reflux study on 12/25/17. Studies show on the right abnormal reflux times of  the popliteal vein, great saphenous vein at the saphenofemoral junction at the proximal thigh, great saphenous vein at the mid calf, and origin of the small saphenous vein.No superficial thrombosis. No deep vein thrombosis in the common femoral, femoral,and popliteal veins. Left abnormal reflex times as well of the common femoral vein, popliteal vein, and a great saphenous vein at the saphenofemoral junction, In great saphenous vein at the mid thigh w/o thrombosis. Has any issues or concerns during visit today. Recommended follow-up to vascular specialist due to abnormalities from the venous reflux study. Denies fever, pain, chills, dizziness, nausea, or vomiting. 01/08/18 upon evaluation today the patient actually seems to be showing some signs of improvement in my opinion at this point in regard to the lower extremity ulcerated areas. She still has a lot of drainage but fortunately nothing that appears to be too significant currently. I have been very happy with the overall progress I see today compared to where things were during the last evaluation that I had with her. Nonetheless she has not had her appointment with the vein specialist as of yet in fact we were able to get this approved for her today and confirmed with them she will be seeing them on December 26. Nonetheless in general I do feel like the compression wraps is doing well for her. 01/17/18; quite a bit of improvement since last time I saw this patient she has a small open area remaining on the left lateral calf and even smaller area medially. She has lymphedema chronic stasis changes with distal skin fibrosis. She has an appointment with vascular surgery later this month 01/24/2018; the patient's medial leg has closed. Still a small open area on the left lateral leg. She states that the 4 layer compression we put on last week was too tight and she had to take it off over a few days ago. We have had resultant increase in her lymphedema  in the dorsal foot and a proximal calf. Fortunately that does  not seem to have resulted in any deterioration in her wounds The patient is going to need compression stockings. We have given her measurements to phone elastic therapy in [place]. She has vascular surgery consult on December 26 02/07/18; she's had continuous contraction on the left lateral leg wound which is now very small. Currently using silver alginate under 3 layer compression She saw Dr. Trula Slade of vascular surgery on 02/06/18. It was noted that she had a normal reflux times in the popliteal vein, great saphenous vein at the saphenofemoral junction, great saphenous vein at the proximal thigh great saphenous vein at the mid calf and origin of the small saphenous vein. It was noted that she had significant reflux in the left saphenous veins with diameter measurements in the 0.7-0.8 cm range. It was felt she would benefit from laser ablation to help minimize the risk of ulcer recurrence. It was recommended that she wear 20-30 thigh-high compression stockings and have follow-up in 4-6 weeks 02/17/2018; I thought this lady would be healed however her compression slipped down and she developed increasing swelling and the wound is actually larger. 1/13; we had deterioration last week after the patient's compression slipped down and she developed periwound swelling. I increased her compression before layers we have been using silver alginate we are a lot better again today. She has her compression stockings in waiting 1/23; the patient's wounds are totally healed today. She has her stockings. This was almost circumferential skin damage. She follows up with Dr. Trula Slade of vascular surgery next Monday. READMISSION 02/21/2021 This is a now 66 year old woman that we had in clinic here discharging in January 2020 with wounds on her left calf chronic venous insufficiency. She was discharged with 30/40 stockings. It does not sound like she has worn  stockings in about a year largely from not being able to get them on herself. In November she developed new blisters on her legs an area laterally is opened into a fairly sizable wound. She has weeping posteriorly as well. She has been using Neosporin and Band-Aids. The patient did see Dr. Trula Slade in 2019 and 2020. He felt she might benefit from laser ablation in her left saphenous veins although because of the wound that healed I do not think he went through with it. She might benefit from seeing him again. Her ABI on the left is 1. 1/17; patient's wound on the posterior left calf is closed she has the 2 large areas last week. We put her in 4-layer compression for edema control is a lot better. Our intake nurse noted greenish drainage and odor. We have been using silver alginate 1/25; PCR culture I did have the substantial wound area on the left lateral lower leg showed staff aureus and group A strep. Low titers of coag negative staph which are probably skin contaminants. Resistance detected to tetracycline methicillin and macrolides. I gave her a starter kit of Nuzyra 150 mg x 3 for 2 days then 300 mg for a further 7 days. Marked odor considerable increase in surrounding erythema. We are using Iodoflex last week however I have changed to silver alginate with underlying Bactroban 2/1; she is completing her [place] tomorrow. The degree of erythema around the wounds looks a lot better. Odor has improved. I gave her silver alginate and Bactroban last week and changing her back to Iodoflex to continue with ongoing debridement 2/8; the periwound looks a lot better. Surface of the wound also looks somewhat better although there is still ongoing debridement to  be done we have been using Iodoflex under compression. Primary dressing and silver alginate 2/15; left posterior calf. Some improvement in the surface of the wound but still very gritty we have been using Iodoflex under compression. 04/05/2021: Left  lateral posterior calf. She continues to have significant amounts of drainage, some of which is probably comprised of the Iodoflex microbleeds. There is no odor to the drainage. The satellite lesion on the more posterior aspect of the calf is epithelializing nicely and has contracted quite a bit. There is robust granulation tissue in the dominant wound with minimal adherent slough. 04/12/2021: The satellite lesion has nearly closed. She continues to have good granulation tissue with minimal slough of the larger primary wound. She continues to have a fair amount of drainage, but I think this is less secondary to switching her dressing from Iodoflex to Prisma. 04/19/2021: The satellite lesion is almost completely epithelialized. The larger primary wound continues to contract with good granulation tissue and minimal slough. She is currently in [place] with compression. 04/26/2021: The satellite lesion has closed. The larger primary wound has contracted further and the granulation tissue is robust without being hypertrophic. Minimal slough is present. 05/03/2021: The satellite lesion remains closed. The larger primary wound has a bit of slough, but has also contracted further with good perimeter epithelialization. Good granulation tissue at the wound surface. There was some greenish drainage on the dressing when it was removed; it is not the blue-green typically associated with Pseudomonas aeruginosa, however. 05/10/2021: The primary wound continues to contract. There is minimal slough. The granulation tissue at 9:00 is a bit hypertrophic. No significant drainage. No odor. 05/17/2021: The wound is a little bit smaller today with good granulation tissue. Minimal slough. No significant drainage or odor. 05/24/2021: The wound continues to contract and has a nice base of granulation tissue. Small amount of slough. No concern for infection. 05/31/2021: For some reason, the wound measured slightly larger today but overall  it still appears to be in good condition with a nice base of granulation tissue and minimal slough. 06/07/2021: The wound is smaller today. Good granulation tissue and minimal slough. 06/14/2021: The wound is unchanged in size. She continues to accumulate some slough. Good granulation tissue on the surface. 06/20/2021: The wound is smaller today. Minimal slough with good granulation tissue. 06/27/2021: The wound on her left lateral leg is smaller today with just a bit of slough and eschar accumulation. Unfortunately, she has opened a new superficial wound on her right lower extremity. She has 3+ pitting edema to the knees on that leg and she does not wear compression stockings. 07/04/2021: In addition to the superficial wound on her right lower extremity that she opened up last week, she has opened 3 additional sites on the same leg. Her compression wraps were clearly not in correct position when she came to clinic today as they were at about the mid calf level rather than to the tibial tuberosity. She says that they slipped earlier and she had to come back on Friday to have them redone. The wound on her left lateral leg is perhaps slightly larger with some slough accumulation. 07/11/2021: The superficial wounds on her right lower extremity are nearly closed. They just have a thin layer of eschar overlying them. The wound on her left lateral leg is a little bit shallower and has a bit of slough accumulation. 07/17/2021: The right medial lower extremity leg wounds have almost completely closed; one of them just has a tiny opening  with a little bit of serous drainage. The left lateral leg wound is really unchanged. It seems to be stalled. 07/24/2021: The right leg wounds are completely closed. The left lateral leg wound is actually bigger today. It is a bit more tender. There is a minor accumulation of slough on the surface. 07/31/2021: The culture that I took last week was positive for MRSA. We added mupirocin  under the silver alginate and I prescribed doxycycline. She has been tolerating this well. The wound is smaller today but does have slough accumulation. No significant pain or drainage. 08/07/2021: She completed her course of doxycycline. The wound looks much better today. It is smaller and the periwound is less inflamed. She does have some slough accumulation on the surface. 08/14/2021: The wound continues to contract. There is slough on the wound surface, but the periwound is intact without inflammation or induration. 08/21/2021: The wound is down to just 2 small open sites. They both have a bit of slough accumulation. 08/28/2021: The wound is down to just 1 small open site with a little bit of slough and eschar accumulation. 09/04/2021: The wound continues to contract but remains open. It is clean without any slough. 09/12/2021: No real change in the overall wound dimensions, but it is flush with the surrounding skin. There is a little bit of slough accumulation. Edema control is good. 09/22/2021: The wound is smaller and even more superficial. Minimal slough accumulation. Good control of edema. 09/29/2021: The wound continues to contract. She reports that it was a little bit "twingey" during the week. It is a little bit dry on inspection today. Light accumulation of slough. Good edema control. 10/06/2021: The wound is about the same size today. There is a little slough on the surface. Edema control is good. 10/13/2021: The wound is about the same size but more superficial. A little bit of slough on the surface. 10/23/2021: The wound is slightly smaller and continues to fill in. Minimal slough on the wound surface. 10/30/2021: The wound continues to contract but has not yet closed. Light slough and eschar present. 11/06/2021: The wound persists, but seems a little bit more epithelialized. There is still slough and eschar accumulation. Electronic Signature(s) Signed: 11/06/2021 11:01:17 AM By: [name]  MD FACS Entered By: [name] on 11/06/2021 11:01:17 -------------------------------------------------------------------------------- Physical Exam Details Patient Name: Date of Service: Erin Face D. 11/06/2021 10:15 A M Medical Record Number: [id number] Patient Account Number: [id number] Date of Birth/Sex: Treating RN: 01/12/1956 (66 y.o. [name] Primary Care Provider: [name] Other Clinician: Referring Provider: Treating Provider/Extender: [name] in Treatment: 36 Constitutional . . . . No acute distress.Marland Kitchen Respiratory Normal work of breathing on room air.. Notes 11/06/2021: The wound persists, but seems a little bit more epithelialized. There is still slough and eschar accumulation. Electronic Signature(s) Signed: 11/06/2021 11:01:41 AM By: [name] MD FACS Entered By: [name] on 11/06/2021 11:01:41 -------------------------------------------------------------------------------- Physician Orders Details Patient Name: Date of Service: Erin Gerold [name] 11/06/2021 10:15 A M Medical Record Number: [id number] Patient Account Number: [id number] Date of Birth/Sex: Treating RN: 1955/10/19 (66 y.o. [name] Primary Care Provider: [name] Other Clinician: Referring Provider: Treating Provider/Extender: [name] in Treatment: 4 Verbal / Phone Orders: No Diagnosis Coding ICD-10 Coding Code Description [phone] Chronic venous hypertension (idiopathic) with ulcer and inflammation of left lower extremity L97.828 Non-pressure chronic ulcer of other part of left lower leg with other specified severity Follow-up Appointments ppointment in  1 week. - Dr. Celine Ahr Room 3 - Tuesday 10/3 at 2:30pm Return A Anesthetic (In clinic) Topical Lidocaine 4% applied to wound bed - Used in clinic Bathing/ Shower/ Hygiene May shower with protection but do not get wound dressing(s) wet.  - Ok to use [employer], can purchase at [facility], [facility], or [employer] Edema Control - Lymphedema / SCD / Other Elevate legs to the level of the heart or above for 30 minutes daily and/or when sitting, a frequency of: - throughout the day Avoid standing for long periods of time. Exercise regularly Compression stocking or Garment 20-30 mm/Hg pressure to: - Brochure for Elastic Therapy. Leg measurements included Wound Treatment Wound #3 - Lower Leg Wound Laterality: Left, Lateral Cleanser: Soap and Water 1 x Per Week/30 Days Discharge Instructions: May shower and wash wound with dial antibacterial soap and water prior to dressing change. Cleanser: Wound Cleanser 1 x Per Week/30 Days Discharge Instructions: Cleanse the wound with wound cleanser prior to applying a clean dressing using gauze sponges, not tissue or cotton balls. Peri-Wound Care: Sween Lotion (Moisturizing lotion) 1 x Per Week/30 Days Discharge Instructions: Apply moisturizing lotion as directed Prim Dressing: Endoform 2x2 in 1 x Per Week/30 Days ary Discharge Instructions: Moisten with saline Secondary Dressing: Zetuvit Plus 4x8 in 1 x Per Week/30 Days Discharge Instructions: Apply over primary dressing as directed. Compression Wrap: FourPress (4 layer compression wrap) 1 x Per Week/30 Days Discharge Instructions: Apply four layer compression as directed. May also use [place] CoFlex 2 layer compression system as alternative. Compression Wrap: Netting,tubular #5 1 x Per Week/30 Days Electronic Signature(s) Signed: 11/06/2021 11:51:09 AM By: [name] MD FACS Entered By: [name] on 11/06/2021 11:01:50 -------------------------------------------------------------------------------- Problem List Details Patient Name: Date of Service: Erin Face D. 11/06/2021 10:15 A M Medical Record Number: [id number] Patient Account Number: [id number] Date of Birth/Sex: Treating RN: 07/06/1955 (66 y.o. [name] Primary Care Provider: [name] Other Clinician: Referring Provider: Treating Provider/Extender: [name] in Treatment: 36 Active Problems ICD-10 Encounter Code Description Active Date MDM Diagnosis 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 (idiopathic) with ulcer and inflammation of left 02/21/2021 No Yes lower extremity L97.828 Non-pressure chronic ulcer of other part of left lower leg with other specified 02/21/2021 No Yes severity Inactive Problems ICD-10 Code Description Active Date Inactive Date L03.116 Cellulitis of left lower limb 03/08/2021 03/08/2021 L97.811 Non-pressure chronic ulcer of other part of right lower leg limited to breakdown of skin 06/27/2021 06/27/2021 Resolved Problems Electronic Signature(s) Signed: 11/06/2021 10:58:46 AM By: [name] MD FACS Entered By: [name] on 11/06/2021 10:58:46 -------------------------------------------------------------------------------- Progress Note Details Patient Name: Date of Service: Erin Face D. 11/06/2021 10:15 A M Medical Record Number: [id number] Patient Account Number: [id number] Date of Birth/Sex: Treating RN: [birth year] (66 y.o. [name] Primary Care Provider: [name] Other Clinician: Referring Provider: Treating Provider/Extender: [name] in Treatment: 36 Subjective Chief Complaint Information obtained from Patient 04/19/2021: The patient is here for ongoing follow-up regarding 2 left lower extremity wounds. 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 She tells [name] that she fell on the gravel driveway in July. She developed injuries on her distal lower leg which have not healed. She saw her primary physician on 11/15/2017 who noted her left shin injuries. Gave her antibiotics. At that point the wounds were almost circumferential  however most were less than 1.5 cm. Weeping edema  fluid was noted. She was referred here for evaluation. The patient has a history of chronic lower extremity edema. 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 She has had prior history with leg weeping fluid. She does not wear compression stockings. She is not doing anything specific to these wound areas. 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 She also has lymphedema. Put her in 3 layer compression and silver alginate last week. She has 3 small wounds with depth just lateral to the tibia. More problematically than this she has numerous shallow areas some of which are almost canal like in shape with tightly adherent painful debris. It would be very difficult and time- consuming to go through this and attempt to individually debride all these areas.. I changed her to collagen today to see if that would help with any of the surface debris on some of these wounds. Otherwise we will not be able to put this in compression we have to have someone change the dressing. The patient is not eligible for home health 12/25/17 on evaluation today patient actually appears to be doing rather well in regard to the ulcer on her lower extremity. Fortunately there does not appear to be evidence of infection at this time. She has been tolerating the dressing changes without complication. This includes the compression wrap. The only issue she had was that the wrap was initially placed over her bunion region which actually calls her some discomfort and pain. Other than that things seem to  be going rather well. 01/01/2018 Seen today for follow-up and management of left lower extremity wound and lymphedema. T oday she presents with a new wound towards to the left lateral LE. Recently treated with a 7 day course of amoxicillin; reason for antibiotic dose is unknown at this time. Tolerating current treatment of collagen with 4- layer wraps. She obtained a venous reflux study on 12/25/17. Studies show on the right abnormal reflux times of the popliteal vein, great saphenous vein at the saphenofemoral junction at the proximal thigh, great saphenous vein at the mid calf, and origin of the small saphenous vein.No superficial thrombosis. No deep vein thrombosis in the common femoral, femoral,and popliteal veins. Left abnormal reflex times as well of the common femoral vein, popliteal vein, and a great saphenous vein at the saphenofemoral junction, In great saphenous vein at the mid thigh w/o thrombosis. Has any issues or concerns during visit today. Recommended follow-up to vascular specialist due to abnormalities from the venous reflux study. Denies fever, pain, chills, dizziness, nausea, or vomiting. 01/08/18 upon evaluation today the patient actually seems to be showing some signs of improvement in my opinion at this point in regard to the lower extremity ulcerated areas. She still has a lot of drainage but fortunately nothing that appears to be too significant currently. I have been very happy with the overall progress I see today compared to where things were during the last evaluation that I had with her. Nonetheless she has not had her appointment with the vein specialist as of yet in fact we were able to get this approved for her today and confirmed with them she will be  seeing them on December 26. Nonetheless in general I do feel like the compression wraps is doing well for her. 01/17/18; quite a bit of improvement since last time I saw this patient she has a small open area remaining  on the left lateral calf and even smaller area medially. She has lymphedema chronic stasis changes with distal skin fibrosis. She has an appointment with vascular surgery later this month 01/24/2018; the patient's medial leg has closed. Still a small open area on the left lateral leg. She states that the 4 layer compression we put on last week was too tight and she had to take it off over a few days ago. We have had resultant increase in her lymphedema in the dorsal foot and a proximal calf. Fortunately that does not seem to have resulted in any deterioration in her wounds The patient is going to need compression stockings. We have given her measurements to phone elastic therapy in [place]. She has vascular surgery consult on December 26 02/07/18; she's had continuous contraction on the left lateral leg wound which is now very small. Currently using silver alginate under 3 layer compression She saw Dr. Trula Slade of vascular surgery on 02/06/18. It was noted that she had a normal reflux times in the popliteal vein, great saphenous vein at the saphenofemoral junction, great saphenous vein at the proximal thigh great saphenous vein at the mid calf and origin of the small saphenous vein. It was noted that she had significant reflux in the left saphenous veins with diameter measurements in the 0.7-0.8 cm range. It was felt she would benefit from laser ablation to help minimize the risk of ulcer recurrence. It was recommended that she wear 20-30 thigh-high compression stockings and have follow-up in 4-6 weeks 02/17/2018; I thought this lady would be healed however her compression slipped down and she developed increasing swelling and the wound is actually larger. 1/13; we had deterioration last week after the patient's compression slipped down and she developed periwound swelling. I increased her compression before layers we have been using silver alginate we are a lot better again today. She has her  compression stockings in waiting 1/23; the patient's wounds are totally healed today. She has her stockings. This was almost circumferential skin damage. She follows up with Dr. Trula Slade of vascular surgery next Monday. READMISSION 02/21/2021 This is a now 66 year old woman that we had in clinic here discharging in January 2020 with wounds on her left calf chronic venous insufficiency. She was discharged with 30/40 stockings. It does not sound like she has worn stockings in about a year largely from not being able to get them on herself. In November she developed new blisters on her legs an area laterally is opened into a fairly sizable wound. She has weeping posteriorly as well. She has been using Neosporin and Band-Aids. The patient did see Dr. Trula Slade in 2019 and 2020. He felt she might benefit from laser ablation in her left saphenous veins although because of the wound that healed I do not think he went through with it. She might benefit from seeing him again. Her ABI on the left is 1. 1/17; patient's wound on the posterior left calf is closed she has the 2 large areas last week. We put her in 4-layer compression for edema control is a lot better. Our intake nurse noted greenish drainage and odor. We have been using silver alginate 1/25; PCR culture I did have the substantial wound area on the left lateral lower leg  showed staff aureus and group A strep. Low titers of coag negative staph which are probably skin contaminants. Resistance detected to tetracycline methicillin and macrolides. I gave her a starter kit of Nuzyra 150 mg x 3 for 2 days then 300 mg for a further 7 days. Marked odor considerable increase in surrounding erythema. We are using Iodoflex last week however I have changed to silver alginate with underlying Bactroban 2/1; she is completing her [place] tomorrow. The degree of erythema around the wounds looks a lot better. Odor has improved. I gave her silver alginate and Bactroban  last week and changing her back to Iodoflex to continue with ongoing debridement 2/8; the periwound looks a lot better. Surface of the wound also looks somewhat better although there is still ongoing debridement to be done we have been using Iodoflex under compression. Primary dressing and silver alginate 2/15; left posterior calf. Some improvement in the surface of the wound but still very gritty we have been using Iodoflex under compression. 04/05/2021: Left lateral posterior calf. She continues to have significant amounts of drainage, some of which is probably comprised of the Iodoflex microbleeds. There is no odor to the drainage. The satellite lesion on the more posterior aspect of the calf is epithelializing nicely and has contracted quite a bit. There is robust granulation tissue in the dominant wound with minimal adherent slough. 04/12/2021: The satellite lesion has nearly closed. She continues to have good granulation tissue with minimal slough of the larger primary wound. She continues to have a fair amount of drainage, but I think this is less secondary to switching her dressing from Iodoflex to Prisma. 04/19/2021: The satellite lesion is almost completely epithelialized. The larger primary wound continues to contract with good granulation tissue and minimal slough. She is currently in [place] with compression. 04/26/2021: The satellite lesion has closed. The larger primary wound has contracted further and the granulation tissue is robust without being hypertrophic. Minimal slough is present. 05/03/2021: The satellite lesion remains closed. The larger primary wound has a bit of slough, but has also contracted further with good perimeter epithelialization. Good granulation tissue at the wound surface. There was some greenish drainage on the dressing when it was removed; it is not the blue-green typically associated with Pseudomonas aeruginosa, however. 05/10/2021: The primary wound continues to  contract. There is minimal slough. The granulation tissue at 9:00 is a bit hypertrophic. No significant drainage. No odor. 05/17/2021: The wound is a little bit smaller today with good granulation tissue. Minimal slough. No significant drainage or odor. 05/24/2021: The wound continues to contract and has a nice base of granulation tissue. Small amount of slough. No concern for infection. 05/31/2021: For some reason, the wound measured slightly larger today but overall it still appears to be in good condition with a nice base of granulation tissue and minimal slough. 06/07/2021: The wound is smaller today. Good granulation tissue and minimal slough. 06/14/2021: The wound is unchanged in size. She continues to accumulate some slough. Good granulation tissue on the surface. 06/20/2021: The wound is smaller today. Minimal slough with good granulation tissue. 06/27/2021: The wound on her left lateral leg is smaller today with just a bit of slough and eschar accumulation. Unfortunately, she has opened a new superficial wound on her right lower extremity. She has 3+ pitting edema to the knees on that leg and she does not wear compression stockings. 07/04/2021: In addition to the superficial wound on her right lower extremity that she opened up last week,  she has opened 3 additional sites on the same leg. Her compression wraps were clearly not in correct position when she came to clinic today as they were at about the mid calf level rather than to the tibial tuberosity. She says that they slipped earlier and she had to come back on Friday to have them redone. The wound on her left lateral leg is perhaps slightly larger with some slough accumulation. 07/11/2021: The superficial wounds on her right lower extremity are nearly closed. They just have a thin layer of eschar overlying them. The wound on her left lateral leg is a little bit shallower and has a bit of slough accumulation. 07/17/2021: The right medial lower  extremity leg wounds have almost completely closed; one of them just has a tiny opening with a little bit of serous drainage. The left lateral leg wound is really unchanged. It seems to be stalled. 07/24/2021: The right leg wounds are completely closed. The left lateral leg wound is actually bigger today. It is a bit more tender. There is a minor accumulation of slough on the surface. 07/31/2021: The culture that I took last week was positive for MRSA. We added mupirocin under the silver alginate and I prescribed doxycycline. She has been tolerating this well. The wound is smaller today but does have slough accumulation. No significant pain or drainage. 08/07/2021: She completed her course of doxycycline. The wound looks much better today. It is smaller and the periwound is less inflamed. She does have some slough accumulation on the surface. 08/14/2021: The wound continues to contract. There is slough on the wound surface, but the periwound is intact without inflammation or induration. 08/21/2021: The wound is down to just 2 small open sites. They both have a bit of slough accumulation. 08/28/2021: The wound is down to just 1 small open site with a little bit of slough and eschar accumulation. 09/04/2021: The wound continues to contract but remains open. It is clean without any slough. 09/12/2021: No real change in the overall wound dimensions, but it is flush with the surrounding skin. There is a little bit of slough accumulation. Edema control is good. 09/22/2021: The wound is smaller and even more superficial. Minimal slough accumulation. Good control of edema. 09/29/2021: The wound continues to contract. She reports that it was a little bit "twingey" during the week. It is a little bit dry on inspection today. Light accumulation of slough. Good edema control. 10/06/2021: The wound is about the same size today. There is a little slough on the surface. Edema control is good. 10/13/2021: The wound is about the  same size but more superficial. A little bit of slough on the surface. 10/23/2021: The wound is slightly smaller and continues to fill in. Minimal slough on the wound surface. 10/30/2021: The wound continues to contract but has not yet closed. Light slough and eschar present. 11/06/2021: The wound persists, but seems a little bit more epithelialized. There is still slough and eschar accumulation. Patient History Information obtained from Patient. Family History Heart Disease - Mother,Father, Hypertension - Mother,Father, Kidney Disease - Mother, Thyroid Problems - Mother, No family history of Cancer, Diabetes, Hereditary Spherocytosis, Lung Disease, Seizures, Stroke, Tuberculosis. Social History Never smoker, Marital Status - Single, Alcohol Use - Never, Drug Use - No History, Caffeine Use - Daily - coffee. Medical History Eyes Patient has history of Cataracts Hematologic/Lymphatic Patient has history of Lymphedema Cardiovascular Patient has history of Hypertension, Peripheral Venous Disease Integumentary (Skin) Denies history of History of Burn  Musculoskeletal Patient has history of Osteoarthritis Hospitalization/Surgery History - colostomy reversal. 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 Medical A Surgical History Notes nd Constitutional Symptoms (General Health) morbid obesity Cardiovascular schamberg disease, hyperlipidemia Gastrointestinal diverticulitis , h/o obstruction due to diverticulitis , colostomy and colostomy reversal Endocrine hypothyroidism Objective Constitutional No acute distress.. Vitals Time Taken: 10:45 AM, Height: 62 in, Weight: 226 lbs, BMI: 41.3, Temperature: 98.5 F, Pulse: 82 bpm, Respiratory Rate: 16 breaths/min, Blood Pressure: 130/75 mmHg. Respiratory Normal work of breathing on room air.. General Notes: 11/06/2021: The wound persists, but seems a little bit more epithelialized. There is still slough and eschar accumulation. Integumentary (Hair,  Skin) Wound #3 status is Open. Original cause of wound was Blister. The date acquired was: 12/13/2020. The wound has been in treatment 36 weeks. The wound is located on the Left,Lateral Lower Leg. The wound measures 0.1cm length x 0.1cm width x 0.1cm depth; 0.008cm^2 area and 0.001cm^3 volume. There is Fat Layer (Subcutaneous Tissue) exposed. There is no tunneling or undermining noted. There is a medium amount of serosanguineous drainage noted. The wound margin is distinct with the outline attached to the wound base. There is large (67-100%) pink granulation within the wound bed. There is a small (1-33%) amount of necrotic tissue within the wound bed including Adherent Slough. 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 There was a Selective/Open Wound Non-Viable Tissue Debridement with a total area of 0.01 sq cm performed by [name], MD. With the following instrument(s): Curette to remove Non-Viable tissue/material. Material removed includes Eschar and Slough and. No specimens were taken. A time out was conducted at 10:56, prior to the start of the procedure. A Minimum amount of bleeding was controlled with Pressure. The procedure was tolerated well with a pain level of 0 throughout and a pain level of 0 following the procedure. Post Debridement Measurements: 0.1cm length x 0.1cm width x 0.1cm depth; 0.001cm^3 volume. Character of Wound/Ulcer Post Debridement is improved. Severity of Tissue Post Debridement is: Fat layer exposed. Post procedure Diagnosis Wound #3: Same as Pre-Procedure General Notes: Scribed for Dr. Celine Ahr by  J.[name]. Plan Follow-up Appointments: Return Appointment in 1 week. - Dr. Celine Ahr Room 3 - Tuesday 10/3 at 2:30pm Anesthetic: (In clinic) Topical Lidocaine 4% applied to wound bed - Used in clinic Bathing/ Shower/ Hygiene: May shower with protection but do not get wound dressing(s) wet. - Ok to use Market [employer], can purchase at [facility], [facility], or [employer] Edema Control - Lymphedema / SCD / Other: Elevate legs to the level of the heart or above for 30 minutes daily and/or when sitting, a frequency of: - throughout the day Avoid standing for long periods of time. Exercise regularly Compression stocking or Garment 20-30 mm/Hg pressure to: - Brochure for Elastic Therapy. Leg measurements included WOUND #3: - Lower Leg Wound Laterality: Left, Lateral Cleanser: Soap and Water 1 x Per Week/30 Days Discharge Instructions: May shower and wash wound with dial antibacterial soap and water prior to dressing change. Cleanser: Wound Cleanser 1 x Per Week/30 Days Discharge Instructions: Cleanse the wound with wound cleanser prior to applying a clean dressing using gauze sponges, not tissue or cotton balls. Peri-Wound Care: Sween Lotion (Moisturizing lotion) 1 x  Per Week/30 Days Discharge Instructions: Apply moisturizing lotion as directed Prim Dressing: Endoform 2x2 in 1 x Per Week/30 Days ary Discharge Instructions: Moisten with saline Secondary Dressing: Zetuvit Plus 4x8 in 1 x Per Week/30 Days Discharge Instructions: Apply over primary dressing as directed. Com pression Wrap: FourPress (4 layer compression wrap) 1 x Per Week/30 Days Discharge Instructions: Apply four layer compression as directed. May also use [place] CoFlex 2 layer compression system as alternative. Com pression Wrap: Netting,tubular #5 1 x Per Week/30 Days 11/06/2021: The wound persists, but seems a little bit more epithelialized. There is still slough and eschar accumulation. I used a curette to debride the slough and eschar. I  think she is doing a little bit better in the 4-layer compression, rather than the Coflex. We will continue this along with endoform. Follow-up in 1 week. Electronic Signature(s) Signed: 11/06/2021 11:02:18 AM By: [name] MD FACS Entered By: [name] on 11/06/2021 11:02:18 -------------------------------------------------------------------------------- HxROS Details Patient Name: Date of Service: Erin Gerold [name] 11/06/2021 10:15 A M Medical Record Number: [id number] Patient Account Number: [id number] Date of Birth/Sex: Treating RN: 1955-02-22 (66 y.o. [name] Primary Care Provider: [name] Other Clinician: Referring Provider: Treating Provider/Extender: [name] in Treatment: 36 Information Obtained From Patient Constitutional Symptoms (General Health) Medical History: Past Medical History Notes: morbid obesity Eyes Medical History: Positive for: Cataracts Hematologic/Lymphatic Medical History: Positive for: Lymphedema Cardiovascular Medical History: Positive for: Hypertension; Peripheral Venous Disease Past Medical History Notes: schamberg disease, hyperlipidemia Gastrointestinal Medical History: Past Medical History Notes: diverticulitis , h/o obstruction due to diverticulitis , colostomy and colostomy reversal Endocrine Medical History: Past Medical History Notes: hypothyroidism Integumentary (Skin) Medical History: Negative for: History of Burn Musculoskeletal Medical History: Positive for: Osteoarthritis HBO Extended History Items Eyes: Cataracts Immunizations Pneumococcal Vaccine: Received Pneumococcal Vaccination: Yes Received Pneumococcal Vaccination On or After 60th Birthday: Yes Implantable Devices None Hospitalization / Surgery History Type of Hospitalization/Surgery colostomy reversal 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 Family and Social History Cancer: No; Diabetes: No; Heart  Disease: Yes - Mother,Father; Hereditary Spherocytosis: No; Hypertension: Yes - Mother,Father; Kidney Disease: Yes - Mother; Lung Disease: No; Seizures: No; Stroke: No; Thyroid Problems: Yes - Mother; Tuberculosis: No; Never smoker; Marital Status - Single; Alcohol Use: Never; Drug Use: No History; Caffeine Use: Daily - coffee; Financial Concerns: No; Food, Clothing or Shelter Needs: No; Support System Lacking: No; Transportation Concerns: No Electronic Signature(s) Signed: 11/06/2021 11:51:09 AM By: [name] MD FACS Signed: 11/06/2021 5:29:08 PM By: [name] RN Entered By: [name] on 11/06/2021 11:01:22 -------------------------------------------------------------------------------- SuperBill Details Patient Name: Date of Service: Erin Face D. 11/06/2021 Medical Record Number: [id number] Patient Account Number: [id number] Date of Birth/Sex: Treating RN: July 28, 1955 (66 y.o. [name] Primary Care Provider: [name] Other Clinician: Referring Provider: Treating Provider/Extender: [name] in Treatment: 36 Diagnosis Coding ICD-10 Codes Code Description [phone] Chronic venous hypertension (idiopathic) with ulcer and inflammation of left lower extremity L97.828 Non-pressure chronic ulcer of other part of left lower leg with other specified severity Facility Procedures CPT4 Code: [id number] Description: [phone] - DEBRIDE WOUND 1ST 20 SQ CM OR < ICD-10 Diagnosis Description L97.828 Non-pressure chronic ulcer of other part of left lower leg with other specified se Modifier: verity Quantity: 1 Physician Procedures : CPT4 Code Description Modifier [id number] - WC PHYS LEVEL 3 - EST PT 25 ICD-10 Diagnosis Description L97.828 Non-pressure chronic ulcer of other part of left lower leg with other specified severity I87.332  Chronic venous hypertension (idiopathic)  with ulcer and inflammation of left lower extremity Quantity:  1 : [id number] - WC PHYS DEBR WO ANESTH 20 SQ CM ICD-10 Diagnosis Description L97.828 Non-pressure chronic ulcer of other part of left lower leg with other specified severity Quantity: 1 Electronic Signature(s) Signed: 11/06/2021 11:02:33 AM By: [name] MD FACS Entered By: [name] on 11/06/2021 11:02:32

## 2021-11-14 ENCOUNTER — Encounter (HOSPITAL_BASED_OUTPATIENT_CLINIC_OR_DEPARTMENT_OTHER): Payer: 59 | Attending: General Surgery | Admitting: General Surgery

## 2021-11-14 DIAGNOSIS — I87332 Chronic venous hypertension (idiopathic) with ulcer and inflammation of left lower extremity: Secondary | ICD-10-CM | POA: Diagnosis not present

## 2021-11-14 DIAGNOSIS — Z09 Encounter for follow-up examination after completed treatment for conditions other than malignant neoplasm: Secondary | ICD-10-CM | POA: Insufficient documentation

## 2021-11-14 DIAGNOSIS — I872 Venous insufficiency (chronic) (peripheral): Secondary | ICD-10-CM | POA: Diagnosis not present

## 2021-11-14 DIAGNOSIS — L97822 Non-pressure chronic ulcer of other part of left lower leg with fat layer exposed: Secondary | ICD-10-CM | POA: Diagnosis not present

## 2021-11-14 NOTE — Progress Notes (Signed)
[name], [name] ([id number]) Visit Report for 11/14/2021 Chief Complaint Document Details Patient Name: Date of Service: [name], [name] 11/14/2021 2:30 PM Medical Record Number: [id number] Patient Account Number: [id number] Date of Birth/Sex: Treating RN: Apr 14, 1955 (66 y.o. F) Primary Care Provider: [name] Other Clinician: Referring Provider: Treating Provider/Extender: [name] in Treatment: 38 Information Obtained from: Patient Chief Complaint 04/19/2021: The patient is here for ongoing follow-up regarding 2 left lower extremity wounds. Electronic Signature(s) Signed: 11/14/2021 2:53:05 PM By: [name] MD FACS Entered By: [name] on 11/14/2021 14:53:05 -------------------------------------------------------------------------------- Debridement Details Patient Name: Date of Service: Erin Gerold [name] 11/14/2021 2:30 PM Medical Record Number: [id number] Patient Account Number: [id number] Date of Birth/Sex: Treating RN: Feb 12, 1956 (66 y.o. [name] Primary Care Provider: [name] Other Clinician: Referring Provider: Treating Provider/Extender: [name] in Treatment: 38 Debridement Performed for Assessment: Wound #3 Left,Lateral Lower Leg Performed By: Physician [name], MD Debridement Type: Debridement Severity of Tissue Pre Debridement: Fat layer exposed Level of Consciousness (Pre-procedure): Awake and Alert Pre-procedure Verification/Time Out Yes - 14:50 Taken: Start Time: 14:50 Pain Control: Lidocaine 5% topical ointment T Area Debrided (L x W): otal 0.5 (cm) x 0.5 (cm) = 0.25 (cm) Tissue and other material debrided: Non-Viable, Eschar Level: Non-Viable Tissue Debridement Description: Selective/Open Wound Instrument: Curette Bleeding: Minimum Hemostasis Achieved: Pressure Procedural Pain: 0 Post Procedural Pain: 0 Response to Treatment: Procedure was  tolerated well Level of Consciousness (Post- Awake and Alert procedure): Post Debridement Measurements of Total Wound Length: (cm) 0.5 Width: (cm) 0.5 Depth: (cm) 0.1 Volume: (cm) 0.02 Character of Wound/Ulcer Post Debridement: Improved Severity of Tissue Post Debridement: Fat layer exposed Post Procedure Diagnosis Same as Pre-procedure Electronic Signature(s) Signed: 11/14/2021 3:04:05 PM By: [name] MD FACS Signed: 11/14/2021 5:11:51 PM By: Sharyn Creamer RN, BSN Entered By: Sharyn Creamer on 11/14/2021 14:51:25 -------------------------------------------------------------------------------- HPI Details Patient Name: Date of Service: Erin Face D. 11/14/2021 2:30 PM Medical Record Number: [id number] Patient Account Number: [id number] Date of Birth/Sex: Treating RN: 1955-04-07 (66 y.o. 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 She tells [name] that she fell on the gravel driveway in July. She developed injuries on her distal lower leg which have not healed. She saw her primary physician on 11/15/2017 who noted her left shin injuries. Gave her antibiotics. At that point the wounds were almost circumferential however most were less than 1.5 cm. Weeping edema fluid was noted. She was referred here for evaluation. The patient has a history of chronic lower extremity edema. 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 She has had prior history with leg weeping fluid. She does not wear compression stockings. She is not doing anything specific to these wound areas. The patient has a history of obesity, arthritis,  peripheral vascular disease hypertension lower extremity edema and Schamberg's disease ABI in our clinic was 1.3 on the left 12/17/2017; patient readmitted to the clinic last week. 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 She also has lymphedema. Put her in 3 layer compression and silver alginate last week. She has 3 small wounds with depth just lateral to the tibia. More  problematically than this she has numerous shallow areas some of which are almost canal like in shape with tightly adherent painful debris. It would be very difficult and time- consuming to go through this and attempt to individually debride all these areas.. I changed her to collagen today to see if that would help with any of the surface debris on some of these wounds. Otherwise we will not be able to put this in compression we have to have someone change the dressing. The patient is not eligible for home health 12/25/17 on evaluation today patient actually appears to be doing rather well in regard to the ulcer on her lower extremity. Fortunately there does not appear to be evidence of infection at this time. She has been tolerating the dressing changes without complication. This includes the compression wrap. The only issue she had was that the wrap was initially placed over her bunion region which actually calls her some discomfort and pain. Other than that things seem to be going rather well. 01/01/2018 Seen today for follow-up and management of left lower extremity wound and lymphedema. T oday she presents with a new wound towards to the left lateral LE. Recently treated with a 7 day course of amoxicillin; reason for antibiotic dose is unknown at this time. Tolerating current treatment of collagen with 4- layer wraps. She obtained a venous reflux study on 12/25/17. Studies show on the right abnormal reflux times of the popliteal vein, great saphenous vein at the saphenofemoral  junction at the proximal thigh, great saphenous vein at the mid calf, and origin of the small saphenous vein.No superficial thrombosis. No deep vein thrombosis in the common femoral, femoral,and popliteal veins. Left abnormal reflex times as well of the common femoral vein, popliteal vein, and a great saphenous vein at the saphenofemoral junction, In great saphenous vein at the mid thigh w/o thrombosis. Has any issues or concerns during visit today. Recommended follow-up to vascular specialist due to abnormalities from the venous reflux study. Denies fever, pain, chills, dizziness, nausea, or vomiting. 01/08/18 upon evaluation today the patient actually seems to be showing some signs of improvement in my opinion at this point in regard to the lower extremity ulcerated areas. She still has a lot of drainage but fortunately nothing that appears to be too significant currently. I have been very happy with the overall progress I see today compared to where things were during the last evaluation that I had with her. Nonetheless she has not had her appointment with the vein specialist as of yet in fact we were able to get this approved for her today and confirmed with them she will be seeing them on December 26. Nonetheless in general I do feel like the compression wraps is doing well for her. 01/17/18; quite a bit of improvement since last time I saw this patient she has a small open area remaining on the left lateral calf and even smaller area medially. She has lymphedema chronic stasis changes with distal skin fibrosis. She has an appointment with vascular surgery later this month 01/24/2018; the patient's medial leg has closed. Still a small open area on the left lateral leg. She states that the 4 layer compression we put on last week was too tight and she had to take it off over a few days ago. We have had resultant increase in her lymphedema in the dorsal foot and a proximal calf. Fortunately that does  not seem to have resulted in any deterioration in her wounds The  patient is going to need compression stockings. We have given her measurements to phone elastic therapy in [place]. She has vascular surgery consult on December 26 02/07/18; she's had continuous contraction on the left lateral leg wound which is now very small. Currently using silver alginate under 3 layer compression She saw Dr. Trula Slade of vascular surgery on 02/06/18. It was noted that she had a normal reflux times in the popliteal vein, great saphenous vein at the saphenofemoral junction, great saphenous vein at the proximal thigh great saphenous vein at the mid calf and origin of the small saphenous vein. It was noted that she had significant reflux in the left saphenous veins with diameter measurements in the 0.7-0.8 cm range. It was felt she would benefit from laser ablation to help minimize the risk of ulcer recurrence. It was recommended that she wear 20-30 thigh-high compression stockings and have follow-up in 4-6 weeks 02/17/2018; I thought this lady would be healed however her compression slipped down and she developed increasing swelling and the wound is actually larger. 1/13; we had deterioration last week after the patient's compression slipped down and she developed periwound swelling. I increased her compression before layers we have been using silver alginate we are a lot better again today. She has her compression stockings in waiting 1/23; the patient's wounds are totally healed today. She has her stockings. This was almost circumferential skin damage. She follows up with Dr. Trula Slade of vascular surgery next Monday. READMISSION 02/21/2021 This is a now 66 year old woman that we had in clinic here discharging in January 2020 with wounds on her left calf chronic venous insufficiency. She was discharged with 30/40 stockings. It does not sound like she has worn stockings in about a year largely from not being able to get  them on herself. In November she developed new blisters on her legs an area laterally is opened into a fairly sizable wound. She has weeping posteriorly as well. She has been using Neosporin and Band-Aids. The patient did see Dr. Trula Slade in 2019 and 2020. He felt she might benefit from laser ablation in her left saphenous veins although because of the wound that healed I do not think he went through with it. She might benefit from seeing him again. Her ABI on the left is 1. 1/17; patient's wound on the posterior left calf is closed she has the 2 large areas last week. We put her in 4-layer compression for edema control is a lot better. Our intake nurse noted greenish drainage and odor. We have been using silver alginate 1/25; PCR culture I did have the substantial wound area on the left lateral lower leg showed staff aureus and group A strep. Low titers of coag negative staph which are probably skin contaminants. Resistance detected to tetracycline methicillin and macrolides. I gave her a starter kit of Nuzyra 150 mg x 3 for 2 days then 300 mg for a further 7 days. Marked odor considerable increase in surrounding erythema. We are using Iodoflex last week however I have changed to silver alginate with underlying Bactroban 2/1; she is completing her [place] tomorrow. The degree of erythema around the wounds looks a lot better. Odor has improved. I gave her silver alginate and Bactroban last week and changing her back to Iodoflex to continue with ongoing debridement 2/8; the periwound looks a lot better. Surface of the wound also looks somewhat better although there is still ongoing debridement to be done we have been using Iodoflex under compression. Primary dressing and  silver alginate 2/15; left posterior calf. Some improvement in the surface of the wound but still very gritty we have been using Iodoflex under compression. 04/05/2021: Left lateral posterior calf. She continues to have significant  amounts of drainage, some of which is probably comprised of the Iodoflex microbleeds. There is no odor to the drainage. The satellite lesion on the more posterior aspect of the calf is epithelializing nicely and has contracted quite a bit. There is robust granulation tissue in the dominant wound with minimal adherent slough. 04/12/2021: The satellite lesion has nearly closed. She continues to have good granulation tissue with minimal slough of the larger primary wound. She continues to have a fair amount of drainage, but I think this is less secondary to switching her dressing from Iodoflex to Prisma. 04/19/2021: The satellite lesion is almost completely epithelialized. The larger primary wound continues to contract with good granulation tissue and minimal slough. She is currently in [place] with compression. 04/26/2021: The satellite lesion has closed. The larger primary wound has contracted further and the granulation tissue is robust without being hypertrophic. Minimal slough is present. 05/03/2021: The satellite lesion remains closed. The larger primary wound has a bit of slough, but has also contracted further with good perimeter epithelialization. Good granulation tissue at the wound surface. There was some greenish drainage on the dressing when it was removed; it is not the blue-green typically associated with Pseudomonas aeruginosa, however. 05/10/2021: The primary wound continues to contract. There is minimal slough. The granulation tissue at 9:00 is a bit hypertrophic. No significant drainage. No odor. 05/17/2021: The wound is a little bit smaller today with good granulation tissue. Minimal slough. No significant drainage or odor. 05/24/2021: The wound continues to contract and has a nice base of granulation tissue. Small amount of slough. No concern for infection. 05/31/2021: For some reason, the wound measured slightly larger today but overall it still appears to be in good condition with a nice base  of granulation tissue and minimal slough. 06/07/2021: The wound is smaller today. Good granulation tissue and minimal slough. 06/14/2021: The wound is unchanged in size. She continues to accumulate some slough. Good granulation tissue on the surface. 06/20/2021: The wound is smaller today. Minimal slough with good granulation tissue. 06/27/2021: The wound on her left lateral leg is smaller today with just a bit of slough and eschar accumulation. Unfortunately, she has opened a new superficial wound on her right lower extremity. She has 3+ pitting edema to the knees on that leg and she does not wear compression stockings. 07/04/2021: In addition to the superficial wound on her right lower extremity that she opened up last week, she has opened 3 additional sites on the same leg. Her compression wraps were clearly not in correct position when she came to clinic today as they were at about the mid calf level rather than to the tibial tuberosity. She says that they slipped earlier and she had to come back on Friday to have them redone. The wound on her left lateral leg is perhaps slightly larger with some slough accumulation. 07/11/2021: The superficial wounds on her right lower extremity are nearly closed. They just have a thin layer of eschar overlying them. The wound on her left lateral leg is a little bit shallower and has a bit of slough accumulation. 07/17/2021: The right medial lower extremity leg wounds have almost completely closed; one of them just has a tiny opening with a little bit of serous drainage. The left lateral leg wound  is really unchanged. It seems to be stalled. 07/24/2021: The right leg wounds are completely closed. The left lateral leg wound is actually bigger today. It is a bit more tender. There is a minor accumulation of slough on the surface. 07/31/2021: The culture that I took last week was positive for MRSA. We added mupirocin under the silver alginate and I prescribed doxycycline. She  has been tolerating this well. The wound is smaller today but does have slough accumulation. No significant pain or drainage. 08/07/2021: She completed her course of doxycycline. The wound looks much better today. It is smaller and the periwound is less inflamed. She does have some slough accumulation on the surface. 08/14/2021: The wound continues to contract. There is slough on the wound surface, but the periwound is intact without inflammation or induration. 08/21/2021: The wound is down to just 2 small open sites. They both have a bit of slough accumulation. 08/28/2021: The wound is down to just 1 small open site with a little bit of slough and eschar accumulation. 09/04/2021: The wound continues to contract but remains open. It is clean without any slough. 09/12/2021: No real change in the overall wound dimensions, but it is flush with the surrounding skin. There is a little bit of slough accumulation. Edema control is good. 09/22/2021: The wound is smaller and even more superficial. Minimal slough accumulation. Good control of edema. 09/29/2021: The wound continues to contract. She reports that it was a little bit "twingey" during the week. It is a little bit dry on inspection today. Light accumulation of slough. Good edema control. 10/06/2021: The wound is about the same size today. There is a little slough on the surface. Edema control is good. 10/13/2021: The wound is about the same size but more superficial. A little bit of slough on the surface. 10/23/2021: The wound is slightly smaller and continues to fill in. Minimal slough on the wound surface. 10/30/2021: The wound continues to contract but has not yet closed. Light slough and eschar present. 11/06/2021: The wound persists, but seems a little bit more epithelialized. There is still slough and eschar accumulation. 11/14/2021: The wound continues to contract but persists. Light eschar around the wound. Electronic Signature(s) Signed: 11/14/2021  2:53:34 PM By: [name] MD FACS Entered By: [name] on 11/14/2021 14:53:34 -------------------------------------------------------------------------------- Physical Exam Details Patient Name: Date of Service: Erin Gerold [name] 11/14/2021 2:30 PM Medical Record Number: [id number] Patient Account Number: [id number] Date of Birth/Sex: Treating RN: 1955-03-29 (66 y.o. F) Primary Care Provider: [name] Other Clinician: Referring Provider: Treating Provider/Extender: [name] in Treatment: 38 Constitutional Hypertensive, asymptomatic. . . . No acute distress.Marland Kitchen Respiratory Normal work of breathing on room air.. Notes 11/14/2021: The wound continues to contract but persists. Light eschar around the wound. Electronic Signature(s) Signed: 11/14/2021 2:55:15 PM By: [name] MD FACS Entered By: [name] on 11/14/2021 14:55:15 -------------------------------------------------------------------------------- Physician Orders Details Patient Name: Date of Service: Erin Gerold [name] 11/14/2021 2:30 PM Medical Record Number: [id number] Patient Account Number: [id number] Date of Birth/Sex: Treating RN: [birth year] (66 y.o. [name] Primary Care Provider: [name] Other Clinician: Referring Provider: Treating Provider/Extender: [name] in Treatment: 5 Verbal / Phone Orders: No Diagnosis Coding ICD-10 Coding Code Description [id number] Chronic venous hypertension (idiopathic) with ulcer and inflammation of left lower extremity L97.828 Non-pressure chronic ulcer of other part of left lower leg with other specified severity Follow-up Appointments ppointment in 1 week. - Dr. Celine Ahr Room 3 -  Tuesday Return A Anesthetic (In clinic) Topical Lidocaine 4% applied to wound bed - Used in clinic Bathing/ Shower/ Hygiene May shower with protection but do not get wound dressing(s) wet. - Ok to use  Market [employer], can purchase at [facility], [facility], or [employer] Edema Control - Lymphedema / SCD / Other Elevate legs to the level of the heart or above for 30 minutes daily and/or when sitting, a frequency of: - throughout the day Avoid standing for long periods of time. Exercise regularly Compression stocking or Garment 20-30 mm/Hg pressure to: - Brochure for Elastic Therapy. Leg measurements included Wound Treatment Wound #3 - Lower Leg Wound Laterality: Left, Lateral Cleanser: Soap and Water 1 x Per Week/30 Days Discharge Instructions: May shower and wash wound with dial antibacterial soap and water prior to dressing change. Cleanser: Wound Cleanser 1 x Per Week/30 Days Discharge Instructions: Cleanse the wound with wound cleanser prior to applying a clean dressing using gauze sponges, not tissue or cotton balls. Peri-Wound Care: Sween Lotion (Moisturizing lotion) 1 x Per Week/30 Days Discharge Instructions: Apply moisturizing lotion as directed Prim Dressing: Endoform 2x2 in 1 x Per Week/30 Days ary Discharge Instructions: Moisten with saline Secondary Dressing: Zetuvit Plus 4x8 in 1 x Per Week/30 Days Discharge Instructions: Apply over primary dressing as directed. Compression Wrap: FourPress (4 layer compression wrap) 1 x Per Week/30 Days Discharge Instructions: Apply four layer compression as directed. May also use [place] CoFlex 2 layer compression system as alternative. Compression Wrap: Netting,tubular #5 1 x Per Week/30 Days Patient Medications llergies: No Known Drug Allergies A Notifications Medication Indication Start End prior to debridement 11/14/2021 lidocaine DOSE topical 5 % ointment - ointment topical Electronic Signature(s) Signed: 11/14/2021 3:04:05 PM By: [name] MD FACS Entered By: [name] on 11/14/2021 14:56:04 -------------------------------------------------------------------------------- Problem List Details Patient Name: Date of  Service: Erin Face D. 11/14/2021 2:30 PM Medical Record Number: [id number] Patient Account Number: [id number] Date of Birth/Sex: Treating RN: 02-10-1956 (66 y.o. F) Primary Care Provider: [name] Other Clinician: Referring Provider: Treating Provider/Extender: [name] in Treatment: 38 Active Problems ICD-10 Encounter Code Description Active Date MDM Diagnosis 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 (idiopathic) with ulcer and inflammation of left 02/21/2021 No Yes lower extremity L97.828 Non-pressure chronic ulcer of other part of left lower leg with other specified 02/21/2021 No Yes severity Inactive Problems ICD-10 Code Description Active Date Inactive Date L03.116 Cellulitis of left lower limb 03/08/2021 03/08/2021 L97.811 Non-pressure chronic ulcer of other part of right lower leg limited to breakdown of skin 06/27/2021 06/27/2021 Resolved Problems Electronic Signature(s) Signed: 11/14/2021 2:52:53 PM By: [name] MD FACS Entered By: [name] on 11/14/2021 14:52:53 -------------------------------------------------------------------------------- Progress Note Details Patient Name: Date of Service: Erin Face D. 11/14/2021 2:30 PM Medical Record Number: [id number] Patient Account Number: [id number] Date of Birth/Sex: Treating RN: February 24, 1955 (66 y.o. F) Primary Care Provider: [name] Other Clinician: Referring Provider: Treating Provider/Extender: [name] in Treatment: 38 Subjective Chief Complaint Information obtained from Patient 04/19/2021: The patient is here for ongoing follow-up regarding 2 left lower extremity wounds. 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 She tells [name] that she fell on the gravel driveway in July. She developed injuries on her distal lower leg which have not healed. She  saw her primary physician on 11/15/2017 who noted her left shin injuries. Gave her antibiotics. At that point the wounds were almost circumferential  however most were less than 1.5 cm. Weeping edema fluid was noted. She was referred here for evaluation. The patient has a history of chronic lower extremity edema. 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 She has had prior history with leg weeping fluid. She does not wear compression stockings. She is not doing anything specific to these wound areas. 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 She also has lymphedema. Put her in 3 layer compression and silver alginate last week. She has 3 small wounds with depth just lateral to the tibia. More problematically than this she has numerous shallow areas some of which are almost canal like in shape with tightly adherent painful debris. It would be very difficult and time- consuming to go through this and attempt to individually debride all these areas.. I changed her to collagen today to see if that would help with any of the surface debris on some of these wounds. Otherwise we will not be able to put this in compression we have to have someone change the dressing. The patient is not eligible for home health 12/25/17 on evaluation today patient actually appears to be doing rather well in regard to the ulcer on her lower extremity. Fortunately there does not appear to be evidence of infection at this time. She has been tolerating the dressing changes without complication. This includes the compression wrap. The only  issue she had was that the wrap was initially placed over her bunion region which actually calls her some discomfort and pain. Other than that things seem to be going rather well. 01/01/2018 Seen today for follow-up and management of left lower extremity wound and lymphedema. T oday she presents with a new wound towards to the left lateral LE. Recently treated with a 7 day course of amoxicillin; reason for antibiotic dose is unknown at this time. Tolerating current treatment of collagen with 4- layer wraps. She obtained a venous reflux study on 12/25/17. Studies show on the right abnormal reflux times of the popliteal vein, great saphenous vein at the saphenofemoral junction at the proximal thigh, great saphenous vein at the mid calf, and origin of the small saphenous vein.No superficial thrombosis. No deep vein thrombosis in the common femoral, femoral,and popliteal veins. Left abnormal reflex times as well of the common femoral vein, popliteal vein, and a great saphenous vein at the saphenofemoral junction, In great saphenous vein at the mid thigh w/o thrombosis. Has any issues or concerns during visit today. Recommended follow-up to vascular specialist due to abnormalities from the venous reflux study. Denies fever, pain, chills, dizziness, nausea, or vomiting. 01/08/18 upon evaluation today the patient actually seems to be showing some signs of improvement in my opinion at this point in regard to the lower extremity ulcerated areas. She still has a lot of drainage but fortunately nothing that appears to be too significant currently. I have been very happy with the overall progress I see today compared to where things were during the last evaluation that I had with her. Nonetheless she has not had her appointment with the vein specialist as of yet in fact we were able to get this approved for  her today and confirmed with them she will be seeing them on December 26. Nonetheless in general I do feel  like the compression wraps is doing well for her. 01/17/18; quite a bit of improvement since last time I saw this patient she has a small open area remaining on the left lateral calf and even smaller area medially. She has lymphedema chronic stasis changes with distal skin fibrosis. She has an appointment with vascular surgery later this month 01/24/2018; the patient's medial leg has closed. Still a small open area on the left lateral leg. She states that the 4 layer compression we put on last week was too tight and she had to take it off over a few days ago. We have had resultant increase in her lymphedema in the dorsal foot and a proximal calf. Fortunately that does not seem to have resulted in any deterioration in her wounds The patient is going to need compression stockings. We have given her measurements to phone elastic therapy in [place]. She has vascular surgery consult on December 26 02/07/18; she's had continuous contraction on the left lateral leg wound which is now very small. Currently using silver alginate under 3 layer compression She saw Dr. Trula Slade of vascular surgery on 02/06/18. It was noted that she had a normal reflux times in the popliteal vein, great saphenous vein at the saphenofemoral junction, great saphenous vein at the proximal thigh great saphenous vein at the mid calf and origin of the small saphenous vein. It was noted that she had significant reflux in the left saphenous veins with diameter measurements in the 0.7-0.8 cm range. It was felt she would benefit from laser ablation to help minimize the risk of ulcer recurrence. It was recommended that she wear 20-30 thigh-high compression stockings and have follow-up in 4-6 weeks 02/17/2018; I thought this lady would be healed however her compression slipped down and she developed increasing swelling and the wound is actually larger. 1/13; we had deterioration last week after the patient's compression slipped down and she  developed periwound swelling. I increased her compression before layers we have been using silver alginate we are a lot better again today. She has her compression stockings in waiting 1/23; the patient's wounds are totally healed today. She has her stockings. This was almost circumferential skin damage. She follows up with Dr. Trula Slade of vascular surgery next Monday. READMISSION 02/21/2021 This is a now 66 year old woman that we had in clinic here discharging in January 2020 with wounds on her left calf chronic venous insufficiency. She was discharged with 30/40 stockings. It does not sound like she has worn stockings in about a year largely from not being able to get them on herself. In November she developed new blisters on her legs an area laterally is opened into a fairly sizable wound. She has weeping posteriorly as well. She has been using Neosporin and Band-Aids. The patient did see Dr. Trula Slade in 2019 and 2020. He felt she might benefit from laser ablation in her left saphenous veins although because of the wound that healed I do not think he went through with it. She might benefit from seeing him again. Her ABI on the left is 1. 1/17; patient's wound on the posterior left calf is closed she has the 2 large areas last week. We put her in 4-layer compression for edema control is a lot better. Our intake nurse noted greenish drainage and odor. We have been using silver alginate 1/25; PCR culture I did have the  substantial wound area on the left lateral lower leg showed staff aureus and group A strep. Low titers of coag negative staph which are probably skin contaminants. Resistance detected to tetracycline methicillin and macrolides. I gave her a starter kit of Nuzyra 150 mg x 3 for 2 days then 300 mg for a further 7 days. Marked odor considerable increase in surrounding erythema. We are using Iodoflex last week however I have changed to silver alginate with underlying Bactroban 2/1; she is  completing her [place] tomorrow. The degree of erythema around the wounds looks a lot better. Odor has improved. I gave her silver alginate and Bactroban last week and changing her back to Iodoflex to continue with ongoing debridement 2/8; the periwound looks a lot better. Surface of the wound also looks somewhat better although there is still ongoing debridement to be done we have been using Iodoflex under compression. Primary dressing and silver alginate 2/15; left posterior calf. Some improvement in the surface of the wound but still very gritty we have been using Iodoflex under compression. 04/05/2021: Left lateral posterior calf. She continues to have significant amounts of drainage, some of which is probably comprised of the Iodoflex microbleeds. There is no odor to the drainage. The satellite lesion on the more posterior aspect of the calf is epithelializing nicely and has contracted quite a bit. There is robust granulation tissue in the dominant wound with minimal adherent slough. 04/12/2021: The satellite lesion has nearly closed. She continues to have good granulation tissue with minimal slough of the larger primary wound. She continues to have a fair amount of drainage, but I think this is less secondary to switching her dressing from Iodoflex to Prisma. 04/19/2021: The satellite lesion is almost completely epithelialized. The larger primary wound continues to contract with good granulation tissue and minimal slough. She is currently in [place] with compression. 04/26/2021: The satellite lesion has closed. The larger primary wound has contracted further and the granulation tissue is robust without being hypertrophic. Minimal slough is present. 05/03/2021: The satellite lesion remains closed. The larger primary wound has a bit of slough, but has also contracted further with good perimeter epithelialization. Good granulation tissue at the wound surface. There was some greenish drainage on the dressing  when it was removed; it is not the blue-green typically associated with Pseudomonas aeruginosa, however. 05/10/2021: The primary wound continues to contract. There is minimal slough. The granulation tissue at 9:00 is a bit hypertrophic. No significant drainage. No odor. 05/17/2021: The wound is a little bit smaller today with good granulation tissue. Minimal slough. No significant drainage or odor. 05/24/2021: The wound continues to contract and has a nice base of granulation tissue. Small amount of slough. No concern for infection. 05/31/2021: For some reason, the wound measured slightly larger today but overall it still appears to be in good condition with a nice base of granulation tissue and minimal slough. 06/07/2021: The wound is smaller today. Good granulation tissue and minimal slough. 06/14/2021: The wound is unchanged in size. She continues to accumulate some slough. Good granulation tissue on the surface. 06/20/2021: The wound is smaller today. Minimal slough with good granulation tissue. 06/27/2021: The wound on her left lateral leg is smaller today with just a bit of slough and eschar accumulation. Unfortunately, she has opened a new superficial wound on her right lower extremity. She has 3+ pitting edema to the knees on that leg and she does not wear compression stockings. 07/04/2021: In addition to the superficial wound on her  right lower extremity that she opened up last week, she has opened 3 additional sites on the same leg. Her compression wraps were clearly not in correct position when she came to clinic today as they were at about the mid calf level rather than to the tibial tuberosity. She says that they slipped earlier and she had to come back on Friday to have them redone. The wound on her left lateral leg is perhaps slightly larger with some slough accumulation. 07/11/2021: The superficial wounds on her right lower extremity are nearly closed. They just have a thin layer of eschar  overlying them. The wound on her left lateral leg is a little bit shallower and has a bit of slough accumulation. 07/17/2021: The right medial lower extremity leg wounds have almost completely closed; one of them just has a tiny opening with a little bit of serous drainage. The left lateral leg wound is really unchanged. It seems to be stalled. 07/24/2021: The right leg wounds are completely closed. The left lateral leg wound is actually bigger today. It is a bit more tender. There is a minor accumulation of slough on the surface. 07/31/2021: The culture that I took last week was positive for MRSA. We added mupirocin under the silver alginate and I prescribed doxycycline. She has been tolerating this well. The wound is smaller today but does have slough accumulation. No significant pain or drainage. 08/07/2021: She completed her course of doxycycline. The wound looks much better today. It is smaller and the periwound is less inflamed. She does have some slough accumulation on the surface. 08/14/2021: The wound continues to contract. There is slough on the wound surface, but the periwound is intact without inflammation or induration. 08/21/2021: The wound is down to just 2 small open sites. They both have a bit of slough accumulation. 08/28/2021: The wound is down to just 1 small open site with a little bit of slough and eschar accumulation. 09/04/2021: The wound continues to contract but remains open. It is clean without any slough. 09/12/2021: No real change in the overall wound dimensions, but it is flush with the surrounding skin. There is a little bit of slough accumulation. Edema control is good. 09/22/2021: The wound is smaller and even more superficial. Minimal slough accumulation. Good control of edema. 09/29/2021: The wound continues to contract. She reports that it was a little bit "twingey" during the week. It is a little bit dry on inspection today. Light accumulation of slough. Good edema  control. 10/06/2021: The wound is about the same size today. There is a little slough on the surface. Edema control is good. 10/13/2021: The wound is about the same size but more superficial. A little bit of slough on the surface. 10/23/2021: The wound is slightly smaller and continues to fill in. Minimal slough on the wound surface. 10/30/2021: The wound continues to contract but has not yet closed. Light slough and eschar present. 11/06/2021: The wound persists, but seems a little bit more epithelialized. There is still slough and eschar accumulation. 11/14/2021: The wound continues to contract but persists. Light eschar around the wound. Patient History Information obtained from Patient. Family History Heart Disease - Mother,Father, Hypertension - Mother,Father, Kidney Disease - Mother, Thyroid Problems - Mother, No family history of Cancer, Diabetes, Hereditary Spherocytosis, Lung Disease, Seizures, Stroke, Tuberculosis. Social History Never smoker, Marital Status - Single, Alcohol Use - Never, Drug Use - No History, Caffeine Use - Daily - coffee. Medical History Eyes Patient has history of Cataracts Hematologic/Lymphatic  Patient has history of Lymphedema Cardiovascular Patient has history of Hypertension, Peripheral Venous Disease Integumentary (Skin) Denies history of History of Burn Musculoskeletal Patient has history of Osteoarthritis Hospitalization/Surgery History - colostomy reversal. 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 Medical A Surgical History Notes nd Constitutional Symptoms (General Health) morbid obesity Cardiovascular schamberg disease, hyperlipidemia Gastrointestinal diverticulitis , h/o obstruction due to diverticulitis , colostomy and colostomy reversal Endocrine hypothyroidism Objective Constitutional Hypertensive, asymptomatic. No acute distress.. Vitals Time Taken: 2:33 PM, Height: 62 in, Weight: 226 lbs, BMI: 41.3, Temperature: 98.1 F, Pulse: 85 bpm,  Respiratory Rate: 18 breaths/min, Blood Pressure: 151/77 mmHg. Respiratory Normal work of breathing on room air.. General Notes: 11/14/2021: The wound continues to contract but persists. Light eschar around the wound. Integumentary (Hair, Skin) Wound #3 status is Open. Original cause of wound was Blister. The date acquired was: 12/13/2020. The wound has been in treatment 38 weeks. The wound is located on the Left,Lateral Lower Leg. The wound measures 0.5cm length x 0.5cm width x 0.1cm depth; 0.196cm^2 area and 0.02cm^3 volume. There is Fat Layer (Subcutaneous Tissue) exposed. There is no tunneling or undermining noted. There is a medium amount of serosanguineous drainage noted. The wound margin is distinct with the outline attached to the wound base. There is large (67-100%) pink granulation within the wound bed. There is a small (1-33%) amount of necrotic tissue within the wound bed including Adherent Slough. The periwound skin appearance had no abnormalities noted for color. The periwound skin appearance did not exhibit: Dry/Scaly, Maceration. Periwound temperature was noted as No Abnormality. 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 There was a Selective/Open Wound Non-Viable Tissue Debridement with a total area of 0.25 sq cm performed by [name], MD. With the following instrument(s): Curette to remove Non-Viable tissue/material. Material removed includes Eschar after achieving pain control using Lidocaine 5% topical ointment. No specimens were taken. A time out was conducted at 14:50, prior to the start of the procedure. A Minimum amount of  bleeding was controlled with Pressure. The procedure was tolerated well with a pain level of 0 throughout and a pain level of 0 following the procedure. Post Debridement Measurements: 0.5cm length x 0.5cm width x 0.1cm depth; 0.02cm^3 volume. Character of Wound/Ulcer Post Debridement is improved. Severity of Tissue Post Debridement is: Fat layer exposed. Post procedure Diagnosis Wound #3: Same as Pre-Procedure Plan Follow-up Appointments: Return Appointment in 1 week. - Dr. Celine Ahr Room 3 - Tuesday Anesthetic: (In clinic) Topical Lidocaine 4% applied to wound bed - Used in clinic Bathing/ Shower/ Hygiene: May shower with protection but do not get wound dressing(s) wet. - Ok to use [employer], can purchase at [facility], [facility], or [employer] Edema Control - Lymphedema / SCD / Other: Elevate legs to the level of the heart or above for 30 minutes daily and/or when sitting, a frequency of: - throughout the day Avoid standing for long periods of time. Exercise regularly Compression stocking or Garment 20-30 mm/Hg pressure to: - Brochure for Elastic Therapy. Leg measurements included The following medication(s) was prescribed: lidocaine topical 5 % ointment ointment topical for prior to debridement was prescribed at facility WOUND #3: - Lower Leg Wound Laterality: Left, Lateral Cleanser: Soap and Water  1 x Per Week/30 Days Discharge Instructions: May shower and wash wound with dial antibacterial soap and water prior to dressing change. Cleanser: Wound Cleanser 1 x Per Week/30 Days Discharge Instructions: Cleanse the wound with wound cleanser prior to applying a clean dressing using gauze sponges, not tissue or cotton balls. Peri-Wound Care: Sween Lotion (Moisturizing lotion) 1 x Per Week/30 Days Discharge Instructions: Apply moisturizing lotion as directed Prim Dressing: Endoform 2x2 in 1 x Per Week/30 Days ary Discharge Instructions: Moisten with saline Secondary Dressing: Zetuvit Plus 4x8 in 1 x  Per Week/30 Days Discharge Instructions: Apply over primary dressing as directed. Com pression Wrap: FourPress (4 layer compression wrap) 1 x Per Week/30 Days Discharge Instructions: Apply four layer compression as directed. May also use [place] CoFlex 2 layer compression system as alternative. Com pression Wrap: Netting,tubular #5 1 x Per Week/30 Days 11/14/2021: The wound continues to contract but persists. Light eschar around the wound. I used a curette to remove the eschar from the wound. We will continue to use endoform with 4-layer compression; I do think the improvement seen this week is secondary to increasing her degree of compression and she tolerated it well. Follow-up in 1 week. Electronic Signature(s) Signed: 11/14/2021 2:56:39 PM By: [name] MD FACS Entered By: [name] on 11/14/2021 14:56:38 -------------------------------------------------------------------------------- HxROS Details Patient Name: Date of Service: [place] Gerold [name] 11/14/2021 2:30 PM Medical Record Number: [id number] Patient Account Number: [id number] Date of Birth/Sex: Treating RN: February 22, 1955 (66 y.o. F) Primary Care Provider: [name] Other Clinician: Referring Provider: Treating Provider/Extender: [name] in Treatment: 38 Information Obtained From Patient Constitutional Symptoms (General Health) Medical History: Past Medical History Notes: morbid obesity Eyes Medical History: Positive for: Cataracts Hematologic/Lymphatic Medical History: Positive for: Lymphedema Cardiovascular Medical History: Positive for: Hypertension; Peripheral Venous Disease Past Medical History Notes: schamberg disease, hyperlipidemia Gastrointestinal Medical History: Past Medical History Notes: diverticulitis , h/o obstruction due to diverticulitis , colostomy and colostomy reversal Endocrine Medical History: Past Medical History  Notes: hypothyroidism Integumentary (Skin) Medical History: Negative for: History of Burn Musculoskeletal Medical History: Positive for: Osteoarthritis HBO Extended History Items Eyes: Cataracts Immunizations Pneumococcal Vaccine: Received Pneumococcal Vaccination: Yes Received Pneumococcal Vaccination On or After 60th Birthday: Yes Implantable Devices None Hospitalization / Surgery History Type of Hospitalization/Surgery colostomy reversal 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 Family and Social History Cancer: No; Diabetes: No; Heart Disease: Yes - Mother,Father; Hereditary Spherocytosis: No; Hypertension: Yes - Mother,Father; Kidney Disease: Yes - Mother; Lung Disease: No; Seizures: No; Stroke: No; Thyroid Problems: Yes - Mother; Tuberculosis: No; Never smoker; Marital Status - Single; Alcohol Use: Never; Drug Use: No History; Caffeine Use: Daily - coffee; Financial Concerns: No; Food, Clothing or Shelter Needs: No; Support System Lacking: No; Transportation Concerns: No Electronic Signature(s) Signed: 11/14/2021 3:04:05 PM By: [name] MD FACS Entered By: [name] on 11/14/2021 14:54:49 -------------------------------------------------------------------------------- SuperBill Details Patient Name: Date of Service: [place] Face D. 11/14/2021 Medical Record Number: [id number] Patient Account Number: [id number] Date of Birth/Sex: Treating RN: November 13, 1955 (66 y.o. F) Primary Care Provider: [name] Other Clinician: Referring Provider: Treating Provider/Extender: [name] in Treatment: 38 Diagnosis Coding ICD-10 Codes Code Description [phone] Chronic venous hypertension (idiopathic) with ulcer and inflammation of left lower extremity L97.828 Non-pressure chronic ulcer of other part of left lower leg with other specified severity Facility Procedures CPT4 Code: [id number] Description: [id number] - DEBRIDE WOUND 1ST 20 SQ CM OR <  ICD-10 Diagnosis Description L97.828 Non-pressure chronic ulcer of other  part of left lower leg with other specified se Modifier: verity Quantity: 1 Physician Procedures : CPT4 Code Description Modifier [id number] - WC PHYS LEVEL 3 - EST PT 25 ICD-10 Diagnosis Description L97.828 Non-pressure chronic ulcer of other part of left lower leg with other specified severity 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 (idiopathic)  with ulcer and inflammation of left lower extremity Quantity: 1 : [id number] - WC PHYS DEBR WO ANESTH 20 SQ CM ICD-10 Diagnosis Description L97.828 Non-pressure chronic ulcer of other part of left lower leg with other specified severity Quantity: 1 Electronic Signature(s) Signed: 11/14/2021 2:59:22 PM By: [name] MD FACS Entered By: [name] on 11/14/2021 14:59:22

## 2021-11-14 NOTE — Progress Notes (Signed)
VILLA, BURGIN ([id number]) Visit Report for 11/14/2021 Arrival Information Details Patient Name: Date of Service: [name], [name] 11/14/2021 2:30 PM Medical Record Number: [id number] Patient Account Number: [id number] Date of Birth/Sex: Treating RN: Jan 18, 1956 (66 y.o. [name] Primary Care Sheddrick Lattanzio: [name] Other Clinician: Referring Macari Zalesky: Treating Simrah Chatham/Extender: [name] in Treatment: 38 Visit Information History Since Last Visit Added or deleted any medications: No Patient Arrived: [name] Any new allergies or adverse reactions: No Arrival Time: 14:32 Had a fall or experienced change in No Accompanied By: self activities of daily living that may affect Transfer Assistance: None risk of falls: Patient Identification Verified: Yes Signs or symptoms of abuse/neglect since last visito No Secondary Verification Process Completed: Yes Hospitalized since last visit: No Patient Requires Transmission-Based Precautions: No Implantable device outside of the clinic excluding No Patient Has Alerts: No cellular tissue based products placed in the center since last visit: Has Dressing in Place as Prescribed: Yes Has Compression in Place as Prescribed: Yes Pain Present Now: No Electronic Signature(s) Signed: 11/14/2021 5:11:51 PM By: Sharyn Creamer RN, BSN Entered By: Sharyn Creamer on 11/14/2021 14:34:07 -------------------------------------------------------------------------------- Encounter Discharge Information Details Patient Name: Date of Service: Erin Face D. 11/14/2021 2:30 PM Medical Record Number: [id number] Patient Account Number: [id number] Date of Birth/Sex: Treating RN: 1955/09/21 (66 y.o. [name] Primary Care Sirr Kabel: [name] Other Clinician: Referring Julya Alioto: Treating Darla Mcdonald/Extender: [name] in Treatment: 38 Encounter Discharge Information Items Post  Procedure Vitals Discharge Condition: Stable Temperature (F): 98.1 Ambulatory Status: Cane Pulse (bpm): 85 Discharge Destination: Home Respiratory Rate (breaths/min): 18 Transportation: Private Auto Blood Pressure (mmHg): 151/77 Accompanied By: self Schedule Follow-up Appointment: Yes Clinical Summary of Care: Patient Declined Electronic Signature(s) Signed: 11/14/2021 5:11:51 PM By: Sharyn Creamer RN, BSN Entered By: Sharyn Creamer on 11/14/2021 16:48:20 -------------------------------------------------------------------------------- Lower Extremity Assessment Details Patient Name: Date of Service: Erin Face D. 11/14/2021 2:30 PM Medical Record Number: [id number] Patient Account Number: [id number] Date of Birth/Sex: Treating RN: 17-Nov-1955 (66 y.o. [name] Primary Care Deb Loudin: [name] Other Clinician: Referring Bodhi Moradi: Treating Ramone Gander/Extender: [name] in Treatment: 38 Edema Assessment Assessed: [Left: No] [Right: No] Edema: [Left: Ye] [Right: s] Calf Left: Right: Point of Measurement: 32 cm From Medial Instep 44.5 cm 44.9 cm Ankle Left: Right: Point of Measurement: 10 cm From Medial Instep 21.5 cm 25 cm Vascular Assessment Pulses: Dorsalis Pedis Palpable: [Left:Yes] Electronic Signature(s) Signed: 11/14/2021 5:11:51 PM By: Sharyn Creamer RN, BSN Entered By: Sharyn Creamer on 11/14/2021 14:43:03 -------------------------------------------------------------------------------- Multi Wound Chart Details Patient Name: Date of Service: Erin Face D. 11/14/2021 2:30 PM Medical Record Number: [id number] Patient Account Number: [id number] Date of Birth/Sex: Treating RN: 1955/06/20 (66 y.o. F) Primary Care Vittorio Mohs: [name] Other Clinician: Referring Mccrae Speciale: Treating Treyton Slimp/Extender: [name] in Treatment: 38 Vital Signs Height(in): 62 Pulse(bpm): 85 Weight(lbs):  226 Blood Pressure(mmHg): 151/77 Body Mass Index(BMI): 41.3 Temperature(F): 98.1 Respiratory Rate(breaths/min): 18 Photos: [N/A:N/A] Left, Lateral Lower Leg N/A N/A Wound Location: Blister N/A N/A Wounding Event: Venous Leg Ulcer N/A N/A Primary Etiology: Cataracts, Lymphedema, N/A N/A Comorbid History: Hypertension, Peripheral Venous Disease, Osteoarthritis 12/13/2020 N/A N/A Date Acquired: 57 N/A N/A Weeks of Treatment: Open N/A N/A Wound Status: No N/A N/A Wound Recurrence: 0.5x0.5x0.1 N/A N/A Measurements L x W x D (cm) 0.196 N/A N/A A (cm) : rea 0.02 N/A N/A Volume (cm) : 99.30% N/A N/A % Reduction in A rea: 99.70% N/A  N/A % Reduction in Volume: Full Thickness Without Exposed N/A N/A Classification: Support Structures Medium N/A N/A Exudate A mount: Serosanguineous N/A N/A Exudate Type: red, brown N/A N/A Exudate Color: Distinct, outline attached N/A N/A Wound Margin: Large (67-100%) N/A N/A Granulation A mount: Pink N/A N/A Granulation Quality: Small (1-33%) N/A N/A Necrotic A mount: Fat Layer (Subcutaneous Tissue): Yes N/A N/A Exposed Structures: Fascia: No Tendon: No Muscle: No Joint: No Bone: No Medium (34-66%) N/A N/A Epithelialization: Debridement - Selective/Open Wound N/A N/A Debridement: Pre-procedure Verification/Time Out 14:50 N/A N/A Taken: Lidocaine 5% topical ointment N/A N/A Pain Control: Necrotic/Eschar N/A N/A Tissue Debrided: Non-Viable Tissue N/A N/A Level: 0.25 N/A N/A Debridement A (sq cm): rea Curette N/A N/A Instrument: Minimum N/A N/A Bleeding: Pressure N/A N/A Hemostasis A chieved: 0 N/A N/A Procedural Pain: 0 N/A N/A Post Procedural Pain: Procedure was tolerated well N/A N/A Debridement Treatment Response: 0.5x0.5x0.1 N/A N/A Post Debridement Measurements L x W x D (cm) 0.02 N/A N/A Post Debridement Volume: (cm) Maceration: No N/A N/A Periwound Skin Moisture: Dry/Scaly: No No Abnormalities  Noted N/A N/A Periwound Skin Color: No Abnormality N/A N/A Temperature: Debridement N/A N/A Procedures Performed: Treatment Notes Electronic Signature(s) Signed: 11/14/2021 2:52:59 PM By: Erin Maudlin [name] Entered By: [name] on 11/14/2021 14:52:59 -------------------------------------------------------------------------------- Multi-Disciplinary Care Plan Details Patient Name: Date of Service: Erin Face D. 11/14/2021 2:30 PM Medical Record Number: [id number] Patient Account Number: [id number] Date of Birth/Sex: Treating RN: 08-15-1955 (66 y.o. [name] Primary Care Rollin Kotowski: [name] Other Clinician: Referring Sakiyah Shur: Treating Dali Kraner/Extender: [name] in Treatment: 38 Multidisciplinary Care Plan reviewed with physician Active Inactive Venous Leg Ulcer Nursing Diagnoses: Actual venous Insuffiency (use after diagnosis is confirmed) Goals: Patient will maintain optimal edema control Date Initiated: 02/21/2021 Target Resolution Date: 12/12/2021 Goal Status: Active Patient/caregiver will verbalize understanding of disease process and disease management Date Initiated: 02/21/2021 Date Inactivated: 03/29/2021 Target Resolution Date: 03/24/2021 Goal Status: Met Interventions: Assess peripheral edema status every visit. Compression as ordered Provide education on venous insufficiency Notes: Wound/Skin Impairment Nursing Diagnoses: Impaired tissue integrity Knowledge deficit related to ulceration/compromised skin integrity Goals: Patient/caregiver will verbalize understanding of skin care regimen Date Initiated: 02/21/2021 Target Resolution Date: 12/12/2021 Goal Status: Active Ulcer/skin breakdown will have a volume reduction of 30% by week 4 Date Initiated: 02/21/2021 Date Inactivated: 03/29/2021 Target Resolution Date: 03/24/2021 Goal Status: Unmet Unmet Reason: infection Ulcer/skin breakdown will have a  volume reduction of 50% by week 8 Date Initiated: 03/29/2021 Date Inactivated: 04/19/2021 Target Resolution Date: 04/21/2021 Goal Status: Met Interventions: Assess patient/caregiver ability to obtain necessary supplies Assess patient/caregiver ability to perform ulcer/skin care regimen upon admission and as needed Assess ulceration(s) every visit Provide education on ulcer and skin care Notes: Electronic Signature(s) Signed: 11/14/2021 5:11:51 PM By: Sharyn Creamer RN, BSN Entered By: Sharyn Creamer on 11/14/2021 14:49:44 -------------------------------------------------------------------------------- Pain Assessment Details Patient Name: Date of Service: Erin Face D. 11/14/2021 2:30 PM Medical Record Number: [id number] Patient Account Number: [id number] Date of Birth/Sex: Treating RN: 11-18-1955 (66 y.o. [name] Primary Care Jaspreet Hollings: [name] Other Clinician: Referring Adi Seales: Treating Tashima Scarpulla/Extender: [name] in Treatment: 38 Active Problems Location of Pain Severity and Description of Pain Patient Has Paino No Site Locations Pain Management and Medication Current Pain Management: Electronic Signature(s) Signed: 11/14/2021 5:11:51 PM By: Sharyn Creamer RN, BSN Entered By: Sharyn Creamer on 11/14/2021 14:34:49 -------------------------------------------------------------------------------- Patient/Caregiver Education Details Patient Name: Date of Service: Erin Gerold [name]  10/3/2023andnbsp2:30 PM Medical Record Number: [id number] Patient Account Number: [id number] Date of Birth/Gender: Treating RN: 12/28/1955 (66 y.o. [name] Primary Care Physician: [name] Other Clinician: Referring Physician: Treating Physician/Extender: [name] in Treatment: 35 Education Assessment Education Provided To: Patient Education Topics Provided Wound/Skin Impairment: Methods:  Explain/Verbal Responses: State content correctly [employer]) Signed: 11/14/2021 5:11:51 PM By: Sharyn Creamer RN, BSN Entered By: Sharyn Creamer on 11/14/2021 14:50:17 -------------------------------------------------------------------------------- Wound Assessment Details Patient Name: Date of Service: Erin Face D. 11/14/2021 2:30 PM Medical Record Number: [id number] Patient Account Number: [id number] Date of Birth/Sex: Treating RN: [birth year] (66 y.o. [name] Primary Care Chenise Mulvihill: [name] Other Clinician: Referring Trevaun Rendleman: Treating Abdiaziz Klahn/Extender: [name] in Treatment: 38 Wound Status Wound Number: 3 Primary Venous Leg Ulcer Etiology: Wound Location: Left, Lateral Lower Leg Wound Open Wounding Event: Blister Status: Date Acquired: 12/13/2020 Comorbid Cataracts, Lymphedema, Hypertension, Peripheral Venous Weeks Of Treatment: 38 History: Disease, Osteoarthritis Clustered Wound: No Photos Wound Measurements Length: (cm) 0.5 Width: (cm) 0.5 Depth: (cm) 0.1 Area: (cm) 0.196 Volume: (cm) 0.02 % Reduction in Area: 99.3% % Reduction in Volume: 99.7% Epithelialization: Medium (34-66%) Tunneling: No Undermining: No Wound Description Classification: Full Thickness Without Exposed Support Structures Wound Margin: Distinct, outline attached Exudate Amount: Medium Exudate Type: Serosanguineous Exudate Color: red, brown Foul Odor After Cleansing: No Slough/Fibrino Yes Wound Bed Granulation Amount: Large (67-100%) Exposed Structure Granulation Quality: Pink Fascia Exposed: No Necrotic Amount: Small (1-33%) Fat Layer (Subcutaneous Tissue) Exposed: Yes Necrotic Quality: Adherent Slough Tendon Exposed: No Muscle Exposed: No Joint Exposed: No Bone Exposed: No Periwound Skin Texture Texture Color No Abnormalities Noted: No No Abnormalities Noted: Yes Moisture Temperature / Pain No Abnormalities  Noted: No Temperature: No Abnormality Dry / Scaly: No Maceration: No Treatment Notes Wound #3 (Lower Leg) Wound Laterality: Left, Lateral Cleanser Soap and Water Discharge Instruction: May shower and wash wound with dial antibacterial soap and water prior to dressing change. Wound Cleanser Discharge Instruction: Cleanse the wound with wound cleanser prior to applying a clean dressing using gauze sponges, not tissue or cotton balls. Peri-Wound Care Sween Lotion (Moisturizing lotion) Discharge Instruction: Apply moisturizing lotion as directed Topical Primary Dressing Endoform 2x2 in Discharge Instruction: Moisten with saline Secondary Dressing Zetuvit Plus 4x8 in Discharge Instruction: Apply over primary dressing as directed. Secured With Compression Wrap FourPress (4 layer compression wrap) Discharge Instruction: Apply four layer compression as directed. May also use [employer]iken CoFlex 2 layer compression system as alternative. Netting,tubular #5 Compression Stockings Environmental education officer) Signed: 11/14/2021 5:11:51 PM By: Sharyn Creamer RN, BSN Entered By: Sharyn Creamer on 11/14/2021 14:45:56 -------------------------------------------------------------------------------- Francis Details Patient Name: Date of Service: Erin Gerold [name] 11/14/2021 2:30 PM Medical Record Number: [id number] Patient Account Number: [id number] Date of Birth/Sex: Treating RN: 1956/01/20 (66 y.o. [name] Primary Care Lamari Beckles: [name] Other Clinician: Referring Kaiyla Stahly: Treating Jisel Fleet/Extender: [name] in Treatment: 38 Vital Signs Time Taken: 14:33 Temperature (F): 98.1 Height (in): 62 Pulse (bpm): 85 Weight (lbs): 226 Respiratory Rate (breaths/min): 18 Body Mass Index (BMI): 41.3 Blood Pressure (mmHg): 151/77 Reference Range: 80 - 120 mg / dl Electronic Signature(s) Signed: 11/14/2021 5:11:51 PM By: Sharyn Creamer RN,  BSN Entered By: Sharyn Creamer on 11/14/2021 14:34:44

## 2021-11-17 ENCOUNTER — Other Ambulatory Visit (HOSPITAL_COMMUNITY): Payer: Self-pay

## 2021-11-17 ENCOUNTER — Other Ambulatory Visit: Payer: Self-pay | Admitting: Internal Medicine

## 2021-11-17 MED ORDER — LEVOTHYROXINE SODIUM 112 MCG PO TABS
112.0000 ug | ORAL_TABLET | Freq: Every day | ORAL | 1 refills | Status: DC
  Filled 2021-11-17: qty 90, 90d supply, fill #0
  Filled 2022-02-13: qty 90, 90d supply, fill #1

## 2021-11-22 ENCOUNTER — Encounter (HOSPITAL_BASED_OUTPATIENT_CLINIC_OR_DEPARTMENT_OTHER): Payer: 59 | Admitting: General Surgery

## 2021-11-22 DIAGNOSIS — Z09 Encounter for follow-up examination after completed treatment for conditions other than malignant neoplasm: Secondary | ICD-10-CM | POA: Diagnosis not present

## 2021-11-22 DIAGNOSIS — I872 Venous insufficiency (chronic) (peripheral): Secondary | ICD-10-CM | POA: Diagnosis not present

## 2021-11-22 DIAGNOSIS — L97822 Non-pressure chronic ulcer of other part of left lower leg with fat layer exposed: Secondary | ICD-10-CM | POA: Diagnosis not present

## 2021-11-22 DIAGNOSIS — I87332 Chronic venous hypertension (idiopathic) with ulcer and inflammation of left lower extremity: Secondary | ICD-10-CM | POA: Diagnosis not present

## 2021-11-22 NOTE — Progress Notes (Signed)
[name], [name] ([id number]) [id number] Page 1 of 12 Visit Report for 11/22/2021 Chief Complaint Document Details Patient Name: Date of Service: [name] 11/22/2021 11:00 A M Medical Record Number: [id number] Patient Account Number: [id number] Date of Birth/Sex: Treating RN: [birth year] (66 y.o. F) Primary Care Provider: [name] Other Clinician: Referring Provider: Treating Provider/Extender: [name] in Treatment: 39 Information Obtained from: Patient Chief Complaint 04/19/2021: The patient is here for ongoing follow-up regarding 2 left lower extremity wounds. Electronic Signature(s) Signed: 11/22/2021 11:51:18 AM By: [name] MD FACS Entered By: [name] on 11/22/2021 11:51:17 -------------------------------------------------------------------------------- Debridement Details Patient Name: Date of Service: [name] 11/22/2021 11:00 A M Medical Record Number: [id number] Patient Account Number: [id number] Date of Birth/Sex: Treating RN: February 27, 1955 (66 y.o. [name] Primary Care Provider: [name] Other Clinician: Referring Provider: Treating Provider/Extender: [name] in Treatment: 39 Debridement Performed for Assessment: Wound #3 Left,Lateral Lower Leg Performed By: Physician [name], MD Debridement Type: Debridement Severity of Tissue Pre Debridement: Fat layer exposed Level of Consciousness (Pre-procedure): Awake and Alert Pre-procedure Verification/Time Out Yes - 11:30 Taken: Start Time: 11:30 Pain Control: Lidocaine 4% T opical Solution T Area Debrided (L x W): otal 0.1 (cm) x 0.1 (cm) = 0.01 (cm) Tissue and other material debrided: Non-Viable, Eschar Level: Non-Viable Tissue Debridement Description: Selective/Open Wound Instrument: Curette Bleeding: Minimum Hemostasis Achieved: Pressure End Time:  11:31 Procedural Pain: 0 Post Procedural Pain: 0 Response to Treatment: Procedure was tolerated well Level of Consciousness (Post- Awake and Alert procedure): Post Debridement Measurements of Total Wound Length: (cm) 0.1 Width: (cm) 0.1 Depth: (cm) 0.1 Volume: (cm) 0.001 Character of Wound/Ulcer Post Debridement: Improved Severity of Tissue Post Debridement: Fat layer exposed [name], [name] ([id number]) [id number] Page 2 of 12 Post Procedure Diagnosis Same as Pre-procedure Notes Scribed for Dr. Celine Ahr by J.[name] Electronic Signature(s) Signed: 11/22/2021 12:06:00 PM By: [name] MD FACS Signed: 11/22/2021 5:32:28 PM By: [name] RN Entered By: [name] on 11/22/2021 11:33:25 -------------------------------------------------------------------------------- HPI Details Patient Name: Date of Service: [name] 11/22/2021 11:00 A M Medical Record Number: [id number] Patient Account Number: [id number] Date of Birth/Sex: Treating RN: 08-25-1955 (66 y.o. 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 She tells [name] that she fell on the gravel driveway in July. She developed injuries on her distal lower leg which have not healed. She saw her primary physician on 11/15/2017 who noted her left shin injuries. Gave her antibiotics. At that point the wounds were almost circumferential however most were less than 1.5 cm. Weeping edema fluid was noted. She was referred here for evaluation. The patient has a history of chronic lower extremity edema. 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 She has had prior history with leg weeping fluid. She does not wear compression stockings. She is not doing anything specific to these wound areas. 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 She  also has lymphedema. Put her in 3 layer compression and silver alginate last week. She has 3 small wounds with depth just lateral to the tibia. More problematically than this she has numerous shallow areas some of which are almost canal like in shape with tightly adherent painful debris. It would be very difficult and time- consuming to go through this and attempt to individually debride all these areas.. I changed her to collagen today to see if that would help with any of the surface debris on some of these wounds. Otherwise we will not be able to put this in compression we have to have someone change the dressing. The patient is not eligible for home health 12/25/17 on evaluation today patient actually appears to be doing rather well in regard to the ulcer on her lower extremity. Fortunately there does not appear to be evidence of infection at this time. She has been tolerating the dressing changes without complication. This includes the compression wrap. The only issue she had was that the wrap was initially placed over her bunion region which actually calls her some discomfort and pain. Other than that things seem to be going rather well. 01/01/2018 Seen today for follow-up and management of left lower extremity wound and lymphedema. T oday she presents with a new wound towards to the left lateral LE. Recently treated with a 7 day course of amoxicillin; reason for antibiotic dose is unknown at this time.  Tolerating current treatment of collagen with 4- layer wraps. She obtained a venous reflux study on 12/25/17. Studies show on the right abnormal reflux times of the popliteal vein, great saphenous vein at the saphenofemoral junction at the proximal thigh, great saphenous vein at the mid calf, and origin of the small saphenous vein.No superficial thrombosis. No deep vein thrombosis in the common femoral, femoral,and popliteal veins. Left abnormal reflex times as well of the common femoral vein, popliteal vein, and a great saphenous vein at the saphenofemoral junction, In great saphenous vein at the mid thigh w/o thrombosis. Has any issues or concerns during visit today. Recommended follow-up to vascular specialist due to abnormalities from the venous reflux study. Denies fever, pain, chills, dizziness, nausea, or vomiting. 01/08/18 upon evaluation today the patient actually seems to be showing some signs of improvement in my opinion at this point in regard to the lower extremity ulcerated areas. She still has a lot of drainage but fortunately nothing that appears to be too significant currently. I have been very happy with the overall progress I see today compared to where things were during the last evaluation that I had with her. Nonetheless she has not had her appointment with the vein specialist as of yet in fact we were able to get this approved for her today and confirmed with them she will be seeing them on December 26. Nonetheless in general I do feel like the compression wraps is doing well for her. 01/17/18; quite a bit of improvement since last time I saw this patient she has a small open area remaining on the left lateral calf and even smaller area medially. She has lymphedema chronic stasis changes with distal skin fibrosis. She has an appointment with vascular surgery later this month 01/24/2018; the patient's medial leg has closed. Still a small open area on the left lateral leg. She  states that the 4 layer compression we put on last week was too tight and she had to take it off over a few days ago. We have had resultant  increase in her lymphedema in the dorsal foot and a proximal calf. Fortunately that does not seem to have resulted in any deterioration in her wounds The patient is going to need compression stockings. We have given her measurements to phone elastic therapy in [place]. She has vascular surgery consult on December 26 02/07/18; she's had continuous contraction on the left lateral leg wound which is now very small. Currently using silver alginate under 3 layer compression She saw Dr. Trula Slade of vascular surgery on 02/06/18. It was noted that she had a normal reflux times in the popliteal vein, great saphenous vein at the saphenofemoral junction, great saphenous vein at the proximal thigh great saphenous vein at the mid calf and origin of the small saphenous vein. It was noted that she had significant reflux in the left saphenous veins with diameter measurements in the 0.7-0.8 cm range. It was felt she would benefit from laser UNIQUA, KIHN [name] ([id number]) [id number] Page 3 of 12 ablation to help minimize the risk of ulcer recurrence. It was recommended that she wear 20-30 thigh-high compression stockings and have follow-up in 4-6 weeks 02/17/2018; I thought this lady would be healed however her compression slipped down and she developed increasing swelling and the wound is actually larger. 1/13; we had deterioration last week after the patient's compression slipped down and she developed periwound swelling. I increased her compression before layers we have been using silver alginate we are a lot better again today. She has her compression stockings in waiting 1/23; the patient's wounds are totally healed today. She has her stockings. This was almost circumferential skin damage. She follows up with Dr. Trula Slade of vascular surgery next  Monday. READMISSION 02/21/2021 This is a now 66 year old woman that we had in clinic here discharging in January 2020 with wounds on her left calf chronic venous insufficiency. She was discharged with 30/40 stockings. It does not sound like she has worn stockings in about a year largely from not being able to get them on herself. In November she developed new blisters on her legs an area laterally is opened into a fairly sizable wound. She has weeping posteriorly as well. She has been using Neosporin and Band-Aids. The patient did see Dr. Trula Slade in 2019 and 2020. He felt she might benefit from laser ablation in her left saphenous veins although because of the wound that healed I do not think he went through with it. She might benefit from seeing him again. Her ABI on the left is 1. 1/17; patient's wound on the posterior left calf is closed she has the 2 large areas last week. We put her in 4-layer compression for edema control is a lot better. Our intake nurse noted greenish drainage and odor. We have been using silver alginate 1/25; PCR culture I did have the substantial wound area on the left lateral lower leg showed staff aureus and group A strep. Low titers of coag negative staph which are probably skin contaminants. Resistance detected to tetracycline methicillin and macrolides. I gave her a starter kit of Nuzyra 150 mg x 3 for 2 days then 300 mg for a further 7 days. Marked odor considerable increase in surrounding erythema. We are using Iodoflex last week however I have changed to silver alginate with underlying Bactroban 2/1; she is completing her [place] tomorrow. The degree of erythema around the wounds looks a lot better. Odor has improved. I gave her silver alginate and Bactroban last week and changing her back to Iodoflex to continue with  ongoing debridement 2/8; the periwound looks a lot better. Surface of the wound also looks somewhat better although there is still ongoing debridement  to be done we have been using Iodoflex under compression. Primary dressing and silver alginate 2/15; left posterior calf. Some improvement in the surface of the wound but still very gritty we have been using Iodoflex under compression. 04/05/2021: Left lateral posterior calf. She continues to have significant amounts of drainage, some of which is probably comprised of the Iodoflex microbleeds. There is no odor to the drainage. The satellite lesion on the more posterior aspect of the calf is epithelializing nicely and has contracted quite a bit. There is robust granulation tissue in the dominant wound with minimal adherent slough. 04/12/2021: The satellite lesion has nearly closed. She continues to have good granulation tissue with minimal slough of the larger primary wound. She continues to have a fair amount of drainage, but I think this is less secondary to switching her dressing from Iodoflex to Prisma. 04/19/2021: The satellite lesion is almost completely epithelialized. The larger primary wound continues to contract with good granulation tissue and minimal slough. She is currently in [place] with compression. 04/26/2021: The satellite lesion has closed. The larger primary wound has contracted further and the granulation tissue is robust without being hypertrophic. Minimal slough is present. 05/03/2021: The satellite lesion remains closed. The larger primary wound has a bit of slough, but has also contracted further with good perimeter epithelialization. Good granulation tissue at the wound surface. There was some greenish drainage on the dressing when it was removed; it is not the blue-green typically associated with Pseudomonas aeruginosa, however. 05/10/2021: The primary wound continues to contract. There is minimal slough. The granulation tissue at 9:00 is a bit hypertrophic. No significant drainage. No odor. 05/17/2021: The wound is a little bit smaller today with good granulation tissue. Minimal  slough. No significant drainage or odor. 05/24/2021: The wound continues to contract and has a nice base of granulation tissue. Small amount of slough. No concern for infection. 05/31/2021: For some reason, the wound measured slightly larger today but overall it still appears to be in good condition with a nice base of granulation tissue and minimal slough. 06/07/2021: The wound is smaller today. Good granulation tissue and minimal slough. 06/14/2021: The wound is unchanged in size. She continues to accumulate some slough. Good granulation tissue on the surface. 06/20/2021: The wound is smaller today. Minimal slough with good granulation tissue. 06/27/2021: The wound on her left lateral leg is smaller today with just a bit of slough and eschar accumulation. Unfortunately, she has opened a new superficial wound on her right lower extremity. She has 3+ pitting edema to the knees on that leg and she does not wear compression stockings. 07/04/2021: In addition to the superficial wound on her right lower extremity that she opened up last week, she has opened 3 additional sites on the same leg. Her compression wraps were clearly not in correct position when she came to clinic today as they were at about the mid calf level rather than to the tibial tuberosity. She says that they slipped earlier and she had to come back on Friday to have them redone. The wound on her left lateral leg is perhaps slightly larger with some slough accumulation. 07/11/2021: The superficial wounds on her right lower extremity are nearly closed. They just have a thin layer of eschar overlying them. The wound on her left lateral leg is a little bit shallower and has a  bit of slough accumulation. 07/17/2021: The right medial lower extremity leg wounds have almost completely closed; one of them just has a tiny opening with a little bit of serous drainage. The left lateral leg wound is really unchanged. It seems to be stalled. 07/24/2021: The  right leg wounds are completely closed. The left lateral leg wound is actually bigger today. It is a bit more tender. There is a minor accumulation of slough on the surface. 07/31/2021: The culture that I took last week was positive for MRSA. We added mupirocin under the silver alginate and I prescribed doxycycline. She has been tolerating this well. The wound is smaller today but does have slough accumulation. No significant pain or drainage. 08/07/2021: She completed her course of doxycycline. The wound looks much better today. It is smaller and the periwound is less inflamed. She does have some slough accumulation on the surface. [name], [name] ([id number]) [id number] Page 4 of 12 08/14/2021: The wound continues to contract. There is slough on the wound surface, but the periwound is intact without inflammation or induration. 08/21/2021: The wound is down to just 2 small open sites. They both have a bit of slough accumulation. 08/28/2021: The wound is down to just 1 small open site with a little bit of slough and eschar accumulation. 09/04/2021: The wound continues to contract but remains open. It is clean without any slough. 09/12/2021: No real change in the overall wound dimensions, but it is flush with the surrounding skin. There is a little bit of slough accumulation. Edema control is good. 09/22/2021: The wound is smaller and even more superficial. Minimal slough accumulation. Good control of edema. 09/29/2021: The wound continues to contract. She reports that it was a little bit "twingey" during the week. It is a little bit dry on inspection today. Light accumulation of slough. Good edema control. 10/06/2021: The wound is about the same size today. There is a little slough on the surface. Edema control is good. 10/13/2021: The wound is about the same size but more superficial. A little bit of slough on the surface. 10/23/2021: The wound is slightly smaller and continues to  fill in. Minimal slough on the wound surface. 10/30/2021: The wound continues to contract but has not yet closed. Light slough and eschar present. 11/06/2021: The wound persists, but seems a little bit more epithelialized. There is still slough and eschar accumulation. 11/14/2021: The wound continues to contract but persists. Light eschar around the wound. 11/22/2021: The wound is down to just a pinhole opening. There is eschar overlying the surface. Edema control is excellent. Electronic Signature(s) Signed: 11/22/2021 11:51:49 AM By: [name] MD FACS Entered By: [name] on 11/22/2021 11:51:49 -------------------------------------------------------------------------------- Physical Exam Details Patient Name: Date of Service: [name] 11/22/2021 11:00 A M Medical Record Number: [id number] Patient Account Number: [id number] Date of Birth/Sex: Treating RN: [birth year] (66 y.o. F) Primary Care Provider: [name] Other Clinician: Referring Provider: Treating Provider/Extender: [name] in Treatment: 39 Constitutional . . . . No acute distress.Marland Kitchen Respiratory Normal work of breathing on room air.. Notes 11/22/2021: The wound is down to just a pinhole opening. There is eschar overlying the surface. Edema control is excellent. Electronic Signature(s) Signed: 11/22/2021 11:56:20 AM By: [name] MD FACS Entered By: [name] on 11/22/2021 11:56:20 -------------------------------------------------------------------------------- Physician Orders Details Patient Name: Date of Service: [name] 11/22/2021 11:00 A M Medical Record Number: [id number] Patient Account Number: [id number] Date of Birth/Sex: Treating RN: 1955/04/11 (66  y.o. [name] Primary Care Provider: [name] Other Clinician: Referring Provider: Treating Provider/Extender: [name], [name]  ([id number]) [id number] Page 5 of 12 Weeks in Treatment: 39 Verbal / Phone Orders: No Diagnosis Coding ICD-10 Coding Code Description 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 (idiopathic) with ulcer and inflammation of left lower extremity L97.828 Non-pressure chronic ulcer of other part of left lower leg with other specified severity Follow-up Appointments ppointment in 1 week. - Dr. Celine Ahr Room 3 - Wednesday 11/29/21 at 10:15am Return A Anesthetic (In clinic) Topical Lidocaine 4% applied to wound bed - Used in clinic Bathing/ Shower/ Hygiene May shower with protection but do not get wound dressing(s) wet. - Ok to use [employer], can purchase at [facility], [facility], or [employer] Edema Control - Lymphedema / SCD / Other Elevate legs to the level of the heart or above for 30 minutes daily and/or when sitting, a frequency of: - throughout the day Avoid standing for long periods of time. Exercise regularly Compression stocking or Garment 20-30 mm/Hg pressure to: - Brochure for Elastic Therapy. Leg measurements included Compression stocking or Garment 30-40 mm/Hg pressure to: - If you can tolerate 30-40 mm/Hg please order these compression stockings Wound Treatment Wound #3 - Lower Leg Wound Laterality: Left, Lateral Cleanser: Soap and Water 1 x Per Week/30 Days Discharge Instructions: May shower and wash wound with dial antibacterial soap and water prior to dressing change. Cleanser: Wound Cleanser 1 x Per Week/30 Days Discharge Instructions: Cleanse the wound with wound cleanser prior to applying a clean dressing using gauze sponges, not tissue or cotton balls. Peri-Wound Care: Sween Lotion (Moisturizing lotion) 1 x Per Week/30 Days Discharge Instructions: Apply moisturizing lotion as directed Prim Dressing: Endoform 2x2 in 1 x Per Week/30 Days ary Discharge Instructions: Moisten with saline Secondary Dressing: Zetuvit Plus 4x8 in 1 x Per Week/30 Days Discharge  Instructions: Apply over primary dressing as directed. Compression Wrap: FourPress (4 layer compression wrap) 1 x Per Week/30 Days Discharge Instructions: Apply four layer compression as directed. May also use [place] CoFlex 2 layer compression system as alternative. Compression Wrap: Netting,tubular #5 1 x Per Week/30 Days Electronic Signature(s) Signed: 11/22/2021 12:06:00 PM By: [name] MD FACS Entered By: [name] on 11/22/2021 11:56:30 -------------------------------------------------------------------------------- Problem List Details Patient Name: Date of Service: [name] 11/22/2021 11:00 A M Medical Record Number: [id number] Patient Account Number: [id number] Date of Birth/Sex: Treating RN: 21-Jul-1955 (66 y.o. F) Primary Care Provider: [name] Other Clinician: Referring Provider: Treating Provider/Extender: [name] in Treatment: [address] Problems ICD-10 TYRICA, AFZAL ([id number]) [id number] Page 6 of 12 Encounter Code Description Active Date MDM Diagnosis 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 (idiopathic) with ulcer and inflammation of left 02/21/2021 No Yes lower extremity L97.828 Non-pressure chronic ulcer of other part of left lower leg with other specified 02/21/2021 No Yes severity Inactive Problems ICD-10 Code Description Active Date Inactive Date L03.116 Cellulitis of left lower limb 03/08/2021 03/08/2021 L97.811 Non-pressure chronic ulcer of other part of right lower leg limited to breakdown of skin 06/27/2021 06/27/2021 Resolved Problems Electronic Signature(s) Signed: 11/22/2021 11:51:06 AM By: [name] MD FACS Entered By: [name] on 11/22/2021 11:51:06 -------------------------------------------------------------------------------- Progress Note Details Patient Name: Date of Service: [name] 11/22/2021 11:00 A M Medical Record Number:  [id number] Patient Account Number: [id number] Date of Birth/Sex: Treating RN: 08/10/1955 (66 y.o. F) Primary Care Provider: [name] Other Clinician: Referring Provider: Treating Provider/Extender: [name] in  Treatment: 65 Subjective Chief Complaint Information obtained from Patient 04/19/2021: The patient is here for ongoing follow-up regarding 2 left lower extremity wounds. 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 She tells [name] that she fell on the gravel driveway in July. She developed injuries on her distal lower leg which have not healed. She saw her primary physician on 11/15/2017 who noted her left shin injuries. Gave her antibiotics. At that point the wounds were almost circumferential however most were less than 1.5 cm. Weeping edema fluid was noted. She was referred here for evaluation. The patient has a history of chronic lower extremity edema. 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 She has had prior history with leg weeping fluid. She does not wear compression stockings. She is not doing anything specific to these wound areas. 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 She also has lymphedema. Put her in 3 layer compression and silver alginate last week. She has 3 small wounds with depth just lateral to the tibia. More problematically than this she has numerous shallow areas some of which are almost canal like in shape with tightly adherent painful debris. It would be very  difficult and time- consuming to go through this and attempt to individually debride all these areas.. I changed her to collagen today to see if that would help with any of the surface debris on some of these wounds. Otherwise we will not be able to put this in compression we have to have someone change the dressing. The patient is not eligible for home health 12/25/17 on evaluation today patient actually appears to be doing rather well in regard to the ulcer on her lower extremity. Fortunately there does not appear to be evidence of infection at this time. She has been tolerating the dressing changes without complication. This includes the compression wrap. The only issue she had was that the wrap was initially placed over her bunion region which actually calls her some discomfort and pain. Other than that things seem to be going rather well. [name], [name] ([id number]) [id number] Page 7 of 12 01/01/2018 Seen today for follow-up and management of left lower extremity wound and lymphedema. T oday she presents with a new wound towards to the left lateral LE. Recently treated with a 7 day course of amoxicillin; reason for antibiotic dose is unknown at this time. Tolerating current treatment of collagen with 4- layer wraps. She obtained a venous reflux study on 12/25/17. Studies show on the right abnormal reflux times of the popliteal vein, great saphenous vein at the saphenofemoral junction at the proximal thigh, great saphenous vein at the mid calf, and origin of the small saphenous vein.No superficial thrombosis. No deep vein thrombosis in the common femoral, femoral,and popliteal veins. Left abnormal reflex times as well of the common femoral vein, popliteal vein, and a great saphenous vein at the saphenofemoral junction, In great saphenous vein at the mid thigh w/o thrombosis. Has any issues or concerns during visit today. Recommended follow-up to vascular specialist  due to abnormalities from the venous reflux study. Denies fever, pain,  chills, dizziness, nausea, or vomiting. 01/08/18 upon evaluation today the patient actually seems to be showing some signs of improvement in my opinion at this point in regard to the lower extremity ulcerated areas. She still has a lot of drainage but fortunately nothing that appears to be too significant currently. I have been very happy with the overall progress I see today compared to where things were during the last evaluation that I had with her. Nonetheless she has not had her appointment with the vein specialist as of yet in fact we were able to get this approved for her today and confirmed with them she will be seeing them on December 26. Nonetheless in general I do feel like the compression wraps is doing well for her. 01/17/18; quite a bit of improvement since last time I saw this patient she has a small open area remaining on the left lateral calf and even smaller area medially. She has lymphedema chronic stasis changes with distal skin fibrosis. She has an appointment with vascular surgery later this month 01/24/2018; the patient's medial leg has closed. Still a small open area on the left lateral leg. She states that the 4 layer compression we put on last week was too tight and she had to take it off over a few days ago. We have had resultant increase in her lymphedema in the dorsal foot and a proximal calf. Fortunately that does not seem to have resulted in any deterioration in her wounds The patient is going to need compression stockings. We have given her measurements to phone elastic therapy in [place]. She has vascular surgery consult on December 26 02/07/18; she's had continuous contraction on the left lateral leg wound which is now very small. Currently using silver alginate under 3 layer compression She saw Dr. Trula Slade of vascular surgery on 02/06/18. It was noted that she had a normal reflux times in the  popliteal vein, great saphenous vein at the saphenofemoral junction, great saphenous vein at the proximal thigh great saphenous vein at the mid calf and origin of the small saphenous vein. It was noted that she had significant reflux in the left saphenous veins with diameter measurements in the 0.7-0.8 cm range. It was felt she would benefit from laser ablation to help minimize the risk of ulcer recurrence. It was recommended that she wear 20-30 thigh-high compression stockings and have follow-up in 4-6 weeks 02/17/2018; I thought this lady would be healed however her compression slipped down and she developed increasing swelling and the wound is actually larger. 1/13; we had deterioration last week after the patient's compression slipped down and she developed periwound swelling. I increased her compression before layers we have been using silver alginate we are a lot better again today. She has her compression stockings in waiting 1/23; the patient's wounds are totally healed today. She has her stockings. This was almost circumferential skin damage. She follows up with Dr. Trula Slade of vascular surgery next Monday. READMISSION 02/21/2021 This is a now 66 year old woman that we had in clinic here discharging in January 2020 with wounds on her left calf chronic venous insufficiency. She was discharged with 30/40 stockings. It does not sound like she has worn stockings in about a year largely from not being able to get them on herself. In November she developed new blisters on her legs an area laterally is opened into a fairly sizable wound. She has weeping posteriorly as well. She has been using Neosporin and Band-Aids. The patient did see Dr.  Brabham in 2019 and 2020. He felt she might benefit from laser ablation in her left saphenous veins although because of the wound that healed I do not think he went through with it. She might benefit from seeing him again. Her ABI on the left is 1. 1/17;  patient's wound on the posterior left calf is closed she has the 2 large areas last week. We put her in 4-layer compression for edema control is a lot better. Our intake nurse noted greenish drainage and odor. We have been using silver alginate 1/25; PCR culture I did have the substantial wound area on the left lateral lower leg showed staff aureus and group A strep. Low titers of coag negative staph which are probably skin contaminants. Resistance detected to tetracycline methicillin and macrolides. I gave her a starter kit of Nuzyra 150 mg x 3 for 2 days then 300 mg for a further 7 days. Marked odor considerable increase in surrounding erythema. We are using Iodoflex last week however I have changed to silver alginate with underlying Bactroban 2/1; she is completing her [place] tomorrow. The degree of erythema around the wounds looks a lot better. Odor has improved. I gave her silver alginate and Bactroban last week and changing her back to Iodoflex to continue with ongoing debridement 2/8; the periwound looks a lot better. Surface of the wound also looks somewhat better although there is still ongoing debridement to be done we have been using Iodoflex under compression. Primary dressing and silver alginate 2/15; left posterior calf. Some improvement in the surface of the wound but still very gritty we have been using Iodoflex under compression. 04/05/2021: Left lateral posterior calf. She continues to have significant amounts of drainage, some of which is probably comprised of the Iodoflex microbleeds. There is no odor to the drainage. The satellite lesion on the more posterior aspect of the calf is epithelializing nicely and has contracted quite a bit. There is robust granulation tissue in the dominant wound with minimal adherent slough. 04/12/2021: The satellite lesion has nearly closed. She continues to have good granulation tissue with minimal slough of the larger primary wound. She continues to  have a fair amount of drainage, but I think this is less secondary to switching her dressing from Iodoflex to Prisma. 04/19/2021: The satellite lesion is almost completely epithelialized. The larger primary wound continues to contract with good granulation tissue and minimal slough. She is currently in [place] with compression. 04/26/2021: The satellite lesion has closed. The larger primary wound has contracted further and the granulation tissue is robust without being hypertrophic. Minimal slough is present. 05/03/2021: The satellite lesion remains closed. The larger primary wound has a bit of slough, but has also contracted further with good perimeter epithelialization. Good granulation tissue at the wound surface. There was some greenish drainage on the dressing when it was removed; it is not the blue-green typically associated with Pseudomonas aeruginosa, however. 05/10/2021: The primary wound continues to contract. There is minimal slough. The granulation tissue at 9:00 is a bit hypertrophic. No significant drainage. No odor. 05/17/2021: The wound is a little bit smaller today with good granulation tissue. Minimal slough. No significant drainage or odor. 05/24/2021: The wound continues to contract and has a nice base of granulation tissue. Small amount of slough. No concern for infection. 05/31/2021: For some reason, the wound measured slightly larger today but overall it still appears to be in good condition with a nice base of granulation tissue and minimal slough. 06/07/2021: The wound  is smaller today. Good granulation tissue and minimal slough. [name], [name] ([id number]) [id number] Page 8 of 12 06/14/2021: The wound is unchanged in size. She continues to accumulate some slough. Good granulation tissue on the surface. 06/20/2021: The wound is smaller today. Minimal slough with good granulation tissue. 06/27/2021: The wound on her left lateral leg is smaller today with just  a bit of slough and eschar accumulation. Unfortunately, she has opened a new superficial wound on her right lower extremity. She has 3+ pitting edema to the knees on that leg and she does not wear compression stockings. 07/04/2021: In addition to the superficial wound on her right lower extremity that she opened up last week, she has opened 3 additional sites on the same leg. Her compression wraps were clearly not in correct position when she came to clinic today as they were at about the mid calf level rather than to the tibial tuberosity. She says that they slipped earlier and she had to come back on Friday to have them redone. The wound on her left lateral leg is perhaps slightly larger with some slough accumulation. 07/11/2021: The superficial wounds on her right lower extremity are nearly closed. They just have a thin layer of eschar overlying them. The wound on her left lateral leg is a little bit shallower and has a bit of slough accumulation. 07/17/2021: The right medial lower extremity leg wounds have almost completely closed; one of them just has a tiny opening with a little bit of serous drainage. The left lateral leg wound is really unchanged. It seems to be stalled. 07/24/2021: The right leg wounds are completely closed. The left lateral leg wound is actually bigger today. It is a bit more tender. There is a minor accumulation of slough on the surface. 07/31/2021: The culture that I took last week was positive for MRSA. We added mupirocin under the silver alginate and I prescribed doxycycline. She has been tolerating this well. The wound is smaller today but does have slough accumulation. No significant pain or drainage. 08/07/2021: She completed her course of doxycycline. The wound looks much better today. It is smaller and the periwound is less inflamed. She does have some slough accumulation on the surface. 08/14/2021: The wound continues to contract. There is slough on the wound surface, but  the periwound is intact without inflammation or induration. 08/21/2021: The wound is down to just 2 small open sites. They both have a bit of slough accumulation. 08/28/2021: The wound is down to just 1 small open site with a little bit of slough and eschar accumulation. 09/04/2021: The wound continues to contract but remains open. It is clean without any slough. 09/12/2021: No real change in the overall wound dimensions, but it is flush with the surrounding skin. There is a little bit of slough accumulation. Edema control is good. 09/22/2021: The wound is smaller and even more superficial. Minimal slough accumulation. Good control of edema. 09/29/2021: The wound continues to contract. She reports that it was a little bit "twingey" during the week. It is a little bit dry on inspection today. Light accumulation of slough. Good edema control. 10/06/2021: The wound is about the same size today. There is a little slough on the surface. Edema control is good. 10/13/2021: The wound is about the same size but more superficial. A little bit of slough on the surface. 10/23/2021: The wound is slightly smaller and continues to fill in. Minimal slough on the wound surface. 10/30/2021: The wound continues to contract  but has not yet closed. Light slough and eschar present. 11/06/2021: The wound persists, but seems a little bit more epithelialized. There is still slough and eschar accumulation. 11/14/2021: The wound continues to contract but persists. Light eschar around the wound. 11/22/2021: The wound is down to just a pinhole opening. There is eschar overlying the surface. Edema control is excellent. Patient History Information obtained from Patient. Family History Heart Disease - Mother,Father, Hypertension - Mother,Father, Kidney Disease - Mother, Thyroid Problems - Mother, No family history of Cancer, Diabetes, Hereditary Spherocytosis, Lung Disease, Seizures, Stroke, Tuberculosis. Social History Never smoker,  Marital Status - Single, Alcohol Use - Never, Drug Use - No History, Caffeine Use - Daily - coffee. Medical History Eyes Patient has history of Cataracts Hematologic/Lymphatic Patient has history of Lymphedema Cardiovascular Patient has history of Hypertension, Peripheral Venous Disease Integumentary (Skin) Denies history of History of Burn Musculoskeletal Patient has history of Osteoarthritis Hospitalization/Surgery History - colostomy reversal. 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 Medical A Surgical History Notes nd Constitutional Symptoms (General Health) morbid obesity Cardiovascular schamberg disease, hyperlipidemia Gastrointestinal diverticulitis , h/o obstruction due to diverticulitis , colostomy and colostomy reversal Endocrine hypothyroidism [name], [name] ([id number]) [id number] Page 9 of 12 Objective Constitutional No acute distress.. Vitals Time Taken: 11:19 AM, Height: 62 in, Weight: 226 lbs, BMI: 41.3, Temperature: 98.2 F, Pulse: 75 bpm, Respiratory Rate: 16 breaths/min, Blood Pressure: 120/75 mmHg. Respiratory Normal work of breathing on room air.. General Notes: 11/22/2021: The wound is down to just a pinhole opening. There is eschar overlying the surface. Edema control is excellent. Integumentary (Hair, Skin) Wound #3 status is Open. Original cause of wound was Blister. The date acquired was: 12/13/2020. The wound has been in treatment 39 weeks. The wound is located on the Left,Lateral Lower Leg. The wound measures 0.1cm length x 0.1cm width x 0.1cm depth; 0.008cm^2 area and 0.001cm^3 volume. There is Fat Layer (Subcutaneous Tissue) exposed. There is no tunneling or undermining noted. There is a medium amount of serosanguineous drainage noted. The wound margin is distinct with the outline attached to the wound base. There is large (67-100%) pink granulation within the wound bed. There is a small (1-33%) amount of necrotic tissue  within the wound bed including Eschar and Adherent Slough. The periwound skin appearance had no abnormalities noted for texture. The periwound skin appearance had no abnormalities noted for color. The periwound skin appearance did not exhibit: Dry/Scaly, Maceration. Periwound temperature was noted as No Abnormality. 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 There was a Selective/Open Wound Non-Viable Tissue Debridement with a total area of 0.01 sq cm performed by [name], MD. With the following instrument(s): Curette to remove Non-Viable tissue/material. Material removed includes Eschar after achieving pain control using Lidocaine 4% Topical Solution. No specimens were taken. A time out was conducted at 11:30, prior to the start of the procedure. A Minimum amount of bleeding was controlled with Pressure. The procedure was tolerated well with a pain level of 0 throughout and a pain level of 0 following the procedure. Post Debridement Measurements: 0.1cm length x 0.1cm width x 0.1cm depth; 0.001cm^3 volume. Character of Wound/Ulcer Post Debridement is improved. Severity of Tissue Post Debridement is: Fat layer  exposed. Post procedure Diagnosis Wound #3: Same as Pre-Procedure General Notes: Scribed for Dr. Celine Ahr by J.[name]. Plan Follow-up Appointments: Return Appointment in 1 week. - Dr. Celine Ahr Room 3 - Wednesday 11/29/21 at 10:15am Anesthetic: (In clinic) Topical Lidocaine 4% applied to wound bed - Used in clinic Bathing/ Shower/ Hygiene: May shower with protection but do not get wound dressing(s) wet. - Ok to use [employer], can  purchase at [facility], [facility], or [employer] Edema Control - Lymphedema / SCD / Other: Elevate legs to the level of the heart or above for 30 minutes daily and/or when sitting, a frequency of: - throughout the day Avoid standing for long periods of time. Exercise regularly Compression stocking or Garment 20-30 mm/Hg pressure to: - Brochure for Elastic Therapy. Leg measurements included Compression stocking or Garment 30-40 mm/Hg pressure to: - If you can tolerate 30-40 mm/Hg please order these compression stockings WOUND #3: - Lower Leg Wound Laterality: Left, Lateral Cleanser: Soap and Water 1 x Per Week/30 Days Discharge Instructions: May shower and wash wound with dial antibacterial soap and water prior to dressing change. Cleanser: Wound Cleanser 1 x Per Week/30 Days Discharge Instructions: Cleanse the wound with wound cleanser prior to applying a clean dressing using gauze sponges, not tissue or cotton balls. Peri-Wound Care: Sween Lotion (Moisturizing lotion) 1 x Per Week/30 Days [name], [name] ([id number]) [phone].pdf Page 10 of 12 Discharge Instructions: Apply moisturizing lotion as directed Prim Dressing: Endoform 2x2 in 1 x Per Week/30 Days ary Discharge Instructions: Moisten with saline Secondary Dressing: Zetuvit Plus 4x8 in 1 x Per Week/30 Days Discharge Instructions: Apply over primary dressing as directed. Com pression Wrap: FourPress (4 layer compression wrap) 1 x Per Week/30 Days Discharge Instructions: Apply four layer compression as directed. May also use [place] CoFlex 2 layer compression system as alternative. Com pression Wrap: Netting,tubular #5 1 x Per Week/30 Days 11/22/2021: The wound is down to just a pinhole opening. There is eschar overlying the surface. Edema control is excellent. I used a curette to debride the eschar off of the wound. I think the increase in compression has been very helpful in getting this wound to near closure. We  will continue to use endoform with 4-layer compression. Follow-up in 1 week. Electronic Signature(s) Signed: 11/22/2021 12:00:31 PM By: [name] MD FACS Entered By: [name] on 11/22/2021 12:00:30 -------------------------------------------------------------------------------- HxROS Details Patient Name: Date of Service: [name] 11/22/2021 11:00 A M Medical Record Number: [id number] Patient Account Number: [id number] Date of Birth/Sex: Treating RN: May 10, 1955 (66 y.o. F) Primary Care Provider: [name] Other Clinician: Referring Provider: Treating Provider/Extender: [name] in Treatment: 39 Information Obtained From Patient Constitutional Symptoms (General Health) Medical History: Past Medical History Notes: morbid obesity Eyes Medical History: Positive for: Cataracts Hematologic/Lymphatic Medical History: Positive for: Lymphedema Cardiovascular Medical History: Positive for: Hypertension; Peripheral Venous Disease Past Medical History Notes: schamberg disease, hyperlipidemia Gastrointestinal Medical History: Past Medical History Notes: diverticulitis , h/o obstruction due to diverticulitis , colostomy and colostomy reversal Endocrine Medical History: Past Medical History Notes: hypothyroidism Integumentary ([address] [name], [name] ([id number]) [id number] Page 11 of 12 Medical History: Negative for: History of Burn Musculoskeletal Medical History: Positive for: Osteoarthritis HBO Extended History Items Eyes: Cataracts Immunizations Pneumococcal Vaccine: Received Pneumococcal Vaccination: Yes Received Pneumococcal Vaccination On or After 60th Birthday: Yes Implantable Devices None Hospitalization / Surgery History Type of Hospitalization/Surgery colostomy reversal 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 Family and Social History Cancer: No; Diabetes: No; Heart Disease: Yes -  Mother,Father; Hereditary Spherocytosis: No; Hypertension: Yes - Mother,Father; Kidney Disease: Yes - Mother; Lung Disease: No; Seizures: No; Stroke: No; Thyroid Problems: Yes - Mother; Tuberculosis: No; Never smoker; Marital Status - Single; Alcohol Use: Never; Drug Use: No History; Caffeine Use: Daily - coffee; Financial Concerns: No; Food, Clothing or Shelter Needs: No; Support System Lacking: No; Transportation Concerns: No Electronic Signature(s) Signed: 11/22/2021 12:06:00 PM By: [name] MD FACS Entered By: [name] on 11/22/2021 11:56:01 -------------------------------------------------------------------------------- SuperBill Details Patient Name: Date of Service: [name] 11/22/2021 Medical Record Number: [id number] Patient Account Number: [id number] Date of Birth/Sex: Treating RN: Oct 09, 1955 (66 y.o. F) Primary Care Provider: [name] Other Clinician: Referring Provider: Treating Provider/Extender: [name] in Treatment: 39 Diagnosis Coding ICD-10 Codes Code Description [phone] Chronic venous hypertension (idiopathic) with ulcer and inflammation of left lower extremity L97.828 Non-pressure chronic ulcer of other part of left lower leg with other specified severity Facility Procedures : CPT4 Code: [id number] Description: 10175 - DEBRIDE WOUND 1ST 20 SQ CM OR < ICD-10 Diagnosis Description L97.828 Non-pressure chronic ulcer of other part of left lower leg with other specified se Modifier: verity Quantity: 1 Physician Procedures : CPT4 Code Description Modifier [id number] - WC PHYS LEVEL 3 - EST PT 25 ICD-10 Diagnosis Description ALFREDA, HAMMAD ([id number]) [id number] X54.008 Non-pressure chronic ulcer of other part of left lower leg with other  specified severity 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 (idiopathic) with ulcer and inflammation of left lower extremity Quantity: 1 pdf Page 12  of 12 : [id number] - WC PHYS DEBR WO ANESTH 20 SQ CM 1 ICD-10 Diagnosis Description L97.828 Non-pressure chronic ulcer of other part of left lower leg with other specified severity Quantity: Electronic Signature(s) Signed: 11/22/2021 12:00:44 PM By: [name] MD FACS Entered By: [name] on 11/22/2021 12:00:44

## 2021-11-22 NOTE — Progress Notes (Signed)
Erin, [name] ([id number]) [id number] Page 1 of 8 Visit Report for 11/22/2021 Arrival Information Details Patient Name: Date of Service: [name], [name] 11/22/2021 11:00 A M Medical Record Number: [id number] Patient Account Number: [id number] Date of Birth/Sex: Treating RN: 1955/11/12 (66 y.o. [name] Primary Care Renji Berwick: [name] Other Clinician: Referring Teola Felipe: Treating Raylene Carmickle/Extender: [name] in Treatment: 39 Visit Information History Since Last Visit Added or deleted any medications: No Patient Arrived: Cane Any new allergies or adverse reactions: No Arrival Time: 11:01 Had a fall or experienced change in No Accompanied By: self activities of daily living that may affect Transfer Assistance: None risk of falls: Patient Identification Verified: Yes Signs or symptoms of abuse/neglect since last visito No Patient Requires Transmission-Based Precautions: No Hospitalized since last visit: No Patient Has Alerts: No Implantable device outside of the clinic excluding No cellular tissue based products placed in the center since last visit: Has Dressing in Place as Prescribed: Yes Has Compression in Place as Prescribed: Yes Pain Present Now: No Electronic Signature(s) Signed: 11/22/2021 5:32:28 PM By: [name] RN Entered By: [name] on 11/22/2021 11:01:37 -------------------------------------------------------------------------------- Compression Therapy Details Patient Name: Date of Service: Erin Face D. 11/22/2021 11:00 A M Medical Record Number: [id number] Patient Account Number: [id number] Date of Birth/Sex: Treating RN: 1955-10-07 (66 y.o. [name] Primary Care Jarred Purtee: [name] Other Clinician: Referring Cerenity Goshorn: Treating Stefhanie Kachmar/Extender: [name] in Treatment: 39 Compression Therapy Performed for Wound  Assessment: Wound #3 Left,Lateral Lower Leg Performed By: Clinician [name], RN Compression Type: Four Layer Post Procedure Diagnosis Same as Pre-procedure Electronic Signature(s) Signed: 11/22/2021 5:32:28 PM By: [name] RN Entered By: [name] on 11/22/2021 15:20:00 ADIRA, LIMBURG D ([id number]) [id number] Page 2 of 8 -------------------------------------------------------------------------------- Encounter Discharge Information Details Patient Name: Date of Service: [name], [name] 11/22/2021 11:00 A M Medical Record Number: [id number] Patient Account Number: [id number] Date of Birth/Sex: Treating RN: 09-24-1955 (66 y.o. [name] Primary Care Carizma Dunsworth: [name] Other Clinician: Referring Jamileth Putzier: Treating Evangelyn Crouse/Extender: [name] in Treatment: 39 Encounter Discharge Information Items Post Procedure Vitals Discharge Condition: Stable Temperature (F): 98.2 Ambulatory Status: Cane Pulse (bpm): 75 Discharge Destination: Home Respiratory Rate (breaths/min): 16 Transportation: Private Auto Blood Pressure (mmHg): 120/75 Accompanied By: self Schedule Follow-up Appointment: Yes Clinical Summary of Care: Patient Declined Electronic Signature(s) Signed: 11/22/2021 5:32:28 PM By: [name] RN Entered By: [name] on 11/22/2021 17:13:51 -------------------------------------------------------------------------------- Lower Extremity Assessment Details Patient Name: Date of Service: Erin, [name] D. 11/22/2021 11:00 A M Medical Record Number: [id number] Patient Account Number: [id number] Date of Birth/Sex: Treating RN: [birth year] (66 y.o. [name] Primary Care Deshea Pooley: [name] Other Clinician: Referring Cloey Sferrazza: Treating Declan Mier/Extender: [name] in Treatment: 39 Edema Assessment Assessed: [Left: No] [Right:  No] Edema: [Left: Ye] [Right: s] Calf Left: Right: Point of Measurement: 32 cm From Medial Instep 41.5 cm 44 cm Ankle Left: Right: Point of Measurement: 10 cm From Medial Instep 21.5 cm 25 cm Knee To Floor Left: Right: From Medial Instep 41 cm 41 cm Vascular Assessment Pulses: Dorsalis Pedis Palpable: [Left:Yes] Electronic Signature(s) Signed: 11/22/2021 5:32:28 PM By: [name] RN Entered By: [name] on 11/22/2021 11:20:29 [name] ([id number]) [id number] Page 3 of 8 -------------------------------------------------------------------------------- Multi Wound Chart Details Patient Name: Date of Service: [name], [name] 11/22/2021 11:00 A M Medical Record Number: [id number] Patient Account Number: [id number] Date of Birth/Sex: Treating RN:  1955/05/15 (66 y.o. F) Primary Care Grae Cannata: [name] Other Clinician: Referring Ninamarie Keel: Treating Karah Caruthers/Extender: [name] in Treatment: 39 Vital Signs Height(in): 62 Pulse(bpm): 75 Weight(lbs): 226 Blood Pressure(mmHg): 120/75 Body Mass Index(BMI): 41.3 Temperature(F): 98.2 Respiratory Rate(breaths/min): 16 [3:Photos:] [N/A:N/A] Left, Lateral Lower Leg N/A N/A Wound Location: Blister N/A N/A Wounding Event: Venous Leg Ulcer N/A N/A Primary Etiology: Cataracts, Lymphedema, N/A N/A Comorbid History: Hypertension, Peripheral Venous Disease, Osteoarthritis 12/13/2020 N/A N/A Date Acquired: 71 N/A N/A Weeks of Treatment: Open N/A N/A Wound Status: No N/A N/A Wound Recurrence: 0.1x0.1x0.1 N/A N/A Measurements L x W x D (cm) 0.008 N/A N/A A (cm) : rea 0.001 N/A N/A Volume (cm) : 100.00% N/A N/A % Reduction in A rea: 100.00% N/A N/A % Reduction in Volume: Full Thickness Without Exposed N/A N/A Classification: Support Structures Medium N/A N/A Exudate A mount: Serosanguineous N/A N/A Exudate Type: red, [name] N/A  N/A Exudate Color: Distinct, outline attached N/A N/A Wound Margin: Large (67-100%) N/A N/A Granulation A mount: Pink N/A N/A Granulation Quality: Small (1-33%) N/A N/A Necrotic A mount: Eschar, Adherent Slough N/A N/A Necrotic Tissue: Fat Layer (Subcutaneous Tissue): Yes N/A N/A Exposed Structures: Fascia: No Tendon: No Muscle: No Joint: No Bone: No Large (67-100%) N/A N/A Epithelialization: Debridement - Selective/Open Wound N/A N/A Debridement: Pre-procedure Verification/Time Out 11:30 N/A N/A Taken: Lidocaine 4% Topical Solution N/A N/A Pain Control: Necrotic/Eschar N/A N/A Tissue Debrided: Non-Viable Tissue N/A N/A Level: 0.01 N/A N/A Debridement A (sq cm): rea Curette N/A N/A Instrument: Minimum N/A N/A Bleeding: Pressure N/A N/A Hemostasis A chieved: 0 N/A N/A Procedural Pain: 0 N/A N/A Post Procedural Pain: Procedure was tolerated well N/A N/A Debridement Treatment Response: 0.1x0.1x0.1 N/A N/A Post Debridement Measurements L x W x D (cm) MELYSSA, SIGNOR ([id number]) [id number] Page 4 of 8 0.001 N/A N/A Post Debridement Volume: (cm) No Abnormalities Noted N/A N/A Periwound Skin Texture: Maceration: No N/A N/A Periwound Skin Moisture: Dry/Scaly: No No Abnormalities Noted N/A N/A Periwound Skin Color: No Abnormality N/A N/A Temperature: Debridement N/A N/A Procedures Performed: Treatment Notes Electronic Signature(s) Signed: 11/22/2021 11:51:12 AM By: [name] MD FACS Entered By: [name] on 11/22/2021 11:51:12 -------------------------------------------------------------------------------- Multi-Disciplinary Care Plan Details Patient Name: Date of Service: Erin Face D. 11/22/2021 11:00 A M Medical Record Number: [id number] Patient Account Number: [id number] Date of Birth/Sex: Treating RN: [birth year] (66 y.o. [name] Primary Care Pairlee Sawtell: [name] Other  Clinician: Referring Wave Calzada: Treating Kelsye Loomer/Extender: [name] in Treatment: 39 Multidisciplinary Care Plan reviewed with physician Active Inactive Venous Leg Ulcer Nursing Diagnoses: Actual venous Insuffiency (use after diagnosis is confirmed) Goals: Patient will maintain optimal edema control Date Initiated: 02/21/2021 Target Resolution Date: 12/12/2021 Goal Status: Active Patient/caregiver will verbalize understanding of disease process and disease management Date Initiated: 02/21/2021 Date Inactivated: 03/29/2021 Target Resolution Date: 03/24/2021 Goal Status: Met Interventions: Assess peripheral edema status every visit. Compression as ordered Provide education on venous insufficiency Notes: Wound/Skin Impairment Nursing Diagnoses: Impaired tissue integrity Knowledge deficit related to ulceration/compromised skin integrity Goals: Patient/caregiver will verbalize understanding of skin care regimen Date Initiated: 02/21/2021 Target Resolution Date: 12/12/2021 Goal Status: Active Ulcer/skin breakdown will have a volume reduction of 30% by week 4 Date Initiated: 02/21/2021 Date Inactivated: 03/29/2021 Target Resolution Date: 03/24/2021 Goal Status: Unmet Unmet Reason: infection Ulcer/skin breakdown will have a volume reduction of 50% by week 8 Date Initiated: 03/29/2021 Date Inactivated: 04/19/2021 Target Resolution Date: 04/21/2021 Goal Status: Met Interventions: Assess patient/caregiver ability  to obtain necessary supplies SHANDEE, JERGENS ([id number]) [id number] Page 5 of 8 Assess patient/caregiver ability to perform ulcer/skin care regimen upon admission and as needed Assess ulceration(s) every visit Provide education on ulcer and skin care Notes: Electronic Signature(s) Signed: 11/22/2021 5:32:28 PM By: [name] RN Entered By: [name] on 11/22/2021  17:11:20 -------------------------------------------------------------------------------- Pain Assessment Details Patient Name: Date of Service: [name], [name]. 11/22/2021 11:00 A M Medical Record Number: [id number] Patient Account Number: [id number] Date of Birth/Sex: Treating RN: 29-Apr-1955 (66 y.o. [name] Primary Care Cabela Pacifico: [name] Other Clinician: Referring Kieryn Burtis: Treating Shian Goodnow/Extender: [name] in Treatment: 39 Active Problems Location of Pain Severity and Description of Pain Patient Has Paino No Site Locations Pain Management and Medication Current Pain Management: Electronic Signature(s) Signed: 11/22/2021 5:32:28 PM By: [name] RN Entered By: [name] on 11/22/2021 11:20:02 -------------------------------------------------------------------------------- Patient/Caregiver Education Details Patient Name: Date of Service: [name] 10/11/2023andnbsp11:00 A M Medical Record Number: [id number] Patient Account Number: [id number] Date of Birth/Gender: Treating RN: Feb 28, 1955 (66 y.o. [name] Primary Care Physician: [name] Other Clinician: Referring Physician: Treating Physician/Extender: [name] in Treatment: [address], [place] D ([id number]) [phone].pdf Page 6 of 8 Education Assessment Education Provided To: Patient Education Topics Provided Wound/Skin Impairment: Methods: Explain/Verbal Responses: Return demonstration correctly Electronic Signature(s) Signed: 11/22/2021 5:32:28 PM By: [name] RN Entered By: [name] on 11/22/2021 17:12:12 -------------------------------------------------------------------------------- Wound Assessment Details Patient Name: Date of Service: Erin Face D. 11/22/2021 11:00 A M Medical Record Number: [id number] Patient Account Number: [id number] Date of  Birth/Sex: Treating RN: December 24, 1955 (66 y.o. [name] Primary Care Deziya Amero: [name] Other Clinician: Referring Teleshia Lemere: Treating Arland Usery/Extender: [name] in Treatment: 39 Wound Status Wound Number: 3 Primary Venous Leg Ulcer Etiology: Wound Location: Left, Lateral Lower Leg Wound Open Wounding Event: Blister Status: Date Acquired: 12/13/2020 Comorbid Cataracts, Lymphedema, Hypertension, Peripheral Venous Weeks Of Treatment: 39 History: Disease, Osteoarthritis Clustered Wound: No Photos Wound Measurements Length: (cm) 0.1 Width: (cm) 0.1 Depth: (cm) 0.1 Area: (cm) 0.008 Volume: (cm) 0.001 % Reduction in Area: 100% % Reduction in Volume: 100% Epithelialization: Large (67-100%) Tunneling: No Undermining: No Wound Description Classification: Full Thickness Without Exposed Support Structures Wound Margin: Distinct, outline attached Exudate Amount: Medium Exudate Type: Serosanguineous Exudate Color: red, [name] Foul Odor After Cleansing: No Slough/Fibrino Yes Wound Bed Granulation Amount: Large (67-100%) Exposed Structure Granulation Quality: Pink Fascia Exposed: No MICHALENE, DEBRULER D ([id number]) [id number] Page 7 of 8 Necrotic Amount: Small (1-33%) Fat Layer (Subcutaneous Tissue) Exposed: Yes Necrotic Quality: Eschar, Adherent Slough Tendon Exposed: No Muscle Exposed: No Joint Exposed: No Bone Exposed: No Periwound Skin Texture Texture Color No Abnormalities Noted: Yes No Abnormalities Noted: Yes Moisture Temperature / Pain No Abnormalities Noted: No Temperature: No Abnormality Dry / Scaly: No Maceration: No Treatment Notes Wound #3 (Lower Leg) Wound Laterality: Left, Lateral Cleanser Soap and Water Discharge Instruction: May shower and wash wound with dial antibacterial soap and water prior to dressing change. Wound Cleanser Discharge Instruction: Cleanse the wound with wound  cleanser prior to applying a clean dressing using gauze sponges, not tissue or cotton balls. Peri-Wound Care Sween Lotion (Moisturizing lotion) Discharge Instruction: Apply moisturizing lotion as directed Topical Primary Dressing Endoform 2x2 in Discharge Instruction: Moisten with saline Secondary Dressing Zetuvit Plus 4x8 in Discharge Instruction: Apply over primary dressing as directed. Secured With Compression Wrap FourPress (4 layer compression wrap) Discharge Instruction: Apply four layer compression  as directed. May also use Miliken CoFlex 2 layer compression system as alternative. Netting,tubular #5 Compression Stockings Add-Ons Electronic Signature(s) Signed: 11/22/2021 5:32:28 PM By: [name] RN Entered By: [name] on 11/22/2021 11:23:53 -------------------------------------------------------------------------------- Vitals Details Patient Name: Date of Service: Erin Face D. 11/22/2021 11:00 A M Medical Record Number: [id number] Patient Account Number: [id number] Date of Birth/Sex: Treating RN: 1955-12-14 (66 y.o. [name] Primary Care Faryn Sieg: [name] Other Clinician: Referring Juni Glaab: Treating Jaskarn Schweer/Extender: [name] in Treatment: 39 Vital Signs Time Taken: 11:19 Temperature (F): 98.2 Height (in): 62 Pulse (bpm): 75 Weight (lbs): 226 Respiratory Rate (breaths/min): [address], [place] D ([id number]) [id number] Page 8 of 8 Body Mass Index (BMI): 41.3 Blood Pressure (mmHg): 120/75 Reference Range: 80 - 120 mg / dl Electronic Signature(s) Signed: 11/22/2021 5:32:28 PM By: [name] RN Entered By: [name] on 11/22/2021 11:19:53

## 2021-11-24 ENCOUNTER — Other Ambulatory Visit (HOSPITAL_COMMUNITY): Payer: Self-pay

## 2021-11-29 ENCOUNTER — Encounter (HOSPITAL_BASED_OUTPATIENT_CLINIC_OR_DEPARTMENT_OTHER): Payer: 59 | Admitting: General Surgery

## 2021-11-29 DIAGNOSIS — L97822 Non-pressure chronic ulcer of other part of left lower leg with fat layer exposed: Secondary | ICD-10-CM | POA: Diagnosis not present

## 2021-11-29 DIAGNOSIS — Z09 Encounter for follow-up examination after completed treatment for conditions other than malignant neoplasm: Secondary | ICD-10-CM | POA: Diagnosis not present

## 2021-11-29 DIAGNOSIS — I87332 Chronic venous hypertension (idiopathic) with ulcer and inflammation of left lower extremity: Secondary | ICD-10-CM | POA: Diagnosis not present

## 2021-11-29 DIAGNOSIS — S81802A Unspecified open wound, left lower leg, initial encounter: Secondary | ICD-10-CM | POA: Diagnosis not present

## 2021-11-29 NOTE — Progress Notes (Signed)
[name], [name] ([id number]) [id number] Page 1 of 10 Visit Report for 11/29/2021 Chief Complaint Document Details Patient Name: Date of Service: [name], [name] 11/29/2021 10:15 A M Medical Record Number: [id number] Patient Account Number: [id number] Date of Birth/Sex: Treating RN: Jun 26, 1955 (66 y.o. F) Primary Care Provider: [name] Other Clinician: Referring Provider: Treating Provider/Extender: [name] in Treatment: 37 Information Obtained from: Patient Chief Complaint 04/19/2021: The patient is here for ongoing follow-up regarding 2 left lower extremity wounds. Electronic Signature(s) Signed: 11/29/2021 10:52:48 AM By: [name] MD FACS Entered By: [name] on 11/29/2021 10:52:48 -------------------------------------------------------------------------------- HPI Details Patient Name: Date of Service: Erin Gerold [name] 11/29/2021 10:15 A M Medical Record Number: [id number] Patient Account Number: [id number] Date of Birth/Sex: Treating RN: 01-21-1956 (66 y.o. 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 She tells [name] that she fell on the gravel driveway in July. She developed injuries on her distal lower leg which have not healed. She saw her primary physician on 11/15/2017 who noted her left shin injuries. Gave her antibiotics. At that point the wounds were almost circumferential however most were less than 1.5 cm. Weeping edema fluid was noted. She was referred here for evaluation. The patient has a history of chronic lower extremity edema. 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 She has had prior history with leg weeping fluid. She does not wear compression stockings. She is not doing anything specific to these wound areas. 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 She also has lymphedema. Put her in 3 layer compression and silver alginate last week. She has 3 small wounds with depth just lateral to the tibia. More problematically than this she has numerous shallow areas some of which are almost canal like in shape with tightly adherent painful debris. It would be very difficult and time- consuming to go through this and attempt to individually debride all these areas.. I changed her to collagen today to see if that would help with any of the surface debris on some of these wounds. Otherwise we will not be able to put this in compression we have to have someone change the dressing. The patient is not eligible for home health 12/25/17 on evaluation today patient actually appears to be doing rather well in regard to the ulcer on her lower extremity. Fortunately there does not appear to be evidence of infection at this time. She has been tolerating the dressing changes without complication. This includes the compression wrap. The only issue she had was that the wrap was initially placed over her bunion region which actually calls her some discomfort and pain. Other than that things seem to be going rather well. 01/01/2018 Seen today for follow-up and management of left lower extremity wound and lymphedema. T oday she presents with a new wound towards to the left lateral LE. Recently treated with a 7 day course of amoxicillin; reason for antibiotic  dose is unknown at  this time. Tolerating current treatment of collagen with 4- layer wraps. She obtained a venous reflux study on 12/25/17. Studies show on the right abnormal reflux times of the popliteal vein, great saphenous vein at the saphenofemoral junction at the proximal thigh, great saphenous vein at the mid calf, and origin of the small saphenous vein.No superficial thrombosis. No deep vein thrombosis in the common femoral, femoral,and popliteal veins. Left abnormal reflex times as well of the common femoral vein, popliteal vein, and a great saphenous vein at the saphenofemoral junction, In great saphenous vein at the mid thigh w/o thrombosis. Has any issues or concerns during visit [name], [name] ([id number]) [id number] Page 2 of 10 today. Recommended follow-up to vascular specialist due to abnormalities from the venous reflux study. Denies fever, pain, chills, dizziness, nausea, or vomiting. 01/08/18 upon evaluation today the patient actually seems to be showing some signs of improvement in my opinion at this point in regard to the lower extremity ulcerated areas. She still has a lot of drainage but fortunately nothing that appears to be too significant currently. I have been very happy with the overall progress I see today compared to where things were during the last evaluation that I had with her. Nonetheless she has not had her appointment with the vein specialist as of yet in fact we were able to get this approved for her today and confirmed with them she will be seeing them on December 26. Nonetheless in general I do feel like the compression wraps is doing well for her. 01/17/18; quite a bit of improvement since last time I saw this patient she has a small open area remaining on the left lateral calf and even smaller area medially. She has lymphedema chronic stasis changes with distal skin fibrosis. She has an appointment with vascular surgery later this  month 01/24/2018; the patient's medial leg has closed. Still a small open area on the left lateral leg. She states that the 4 layer compression we put on last week was too tight and she had to take it off over a few days ago. We have had resultant increase in her lymphedema in the dorsal foot and a proximal calf. Fortunately that does not seem to have resulted in any deterioration in her wounds The patient is going to need compression stockings. We have given her measurements to phone elastic therapy in [place]. She has vascular surgery consult on December 26 02/07/18; she's had continuous contraction on the left lateral leg wound which is now very small. Currently using silver alginate under 3 layer compression She saw Dr. Trula Slade of vascular surgery on 02/06/18. It was noted that she had a normal reflux times in the popliteal vein, great saphenous vein at the saphenofemoral junction, great saphenous vein at the proximal thigh great saphenous vein at the mid calf and origin of the small saphenous vein. It was noted that she had significant reflux in the left saphenous veins with diameter measurements in the 0.7-0.8 cm range. It was felt she would benefit from laser ablation to help minimize the risk of ulcer recurrence. It was recommended that she wear 20-30 thigh-high compression stockings and have follow-up in 4-6 weeks 02/17/2018; I thought this lady would be healed however her compression slipped down and she developed increasing swelling and the wound is actually larger. 1/13; we had deterioration last week after the patient's compression slipped down and she developed periwound swelling. I increased her compression before layers we have been using silver alginate we are  a lot better again today. She has her compression stockings in waiting 1/23; the patient's wounds are totally healed today. She has her stockings. This was almost circumferential skin damage. She follows up with Dr. Trula Slade  of vascular surgery next Monday. READMISSION 02/21/2021 This is a now 66 year old woman that we had in clinic here discharging in January 2020 with wounds on her left calf chronic venous insufficiency. She was discharged with 30/40 stockings. It does not sound like she has worn stockings in about a year largely from not being able to get them on herself. In November she developed new blisters on her legs an area laterally is opened into a fairly sizable wound. She has weeping posteriorly as well. She has been using Neosporin and Band-Aids. The patient did see Dr. Trula Slade in 2019 and 2020. He felt she might benefit from laser ablation in her left saphenous veins although because of the wound that healed I do not think he went through with it. She might benefit from seeing him again. Her ABI on the left is 1. 1/17; patient's wound on the posterior left calf is closed she has the 2 large areas last week. We put her in 4-layer compression for edema control is a lot better. Our intake nurse noted greenish drainage and odor. We have been using silver alginate 1/25; PCR culture I did have the substantial wound area on the left lateral lower leg showed staff aureus and group A strep. Low titers of coag negative staph which are probably skin contaminants. Resistance detected to tetracycline methicillin and macrolides. I gave her a starter kit of Nuzyra 150 mg x 3 for 2 days then 300 mg for a further 7 days. Marked odor considerable increase in surrounding erythema. We are using Iodoflex last week however I have changed to silver alginate with underlying Bactroban 2/1; she is completing her [place] tomorrow. The degree of erythema around the wounds looks a lot better. Odor has improved. I gave her silver alginate and Bactroban last week and changing her back to Iodoflex to continue with ongoing debridement 2/8; the periwound looks a lot better. Surface of the wound also looks somewhat better although there is  still ongoing debridement to be done we have been using Iodoflex under compression. Primary dressing and silver alginate 2/15; left posterior calf. Some improvement in the surface of the wound but still very gritty we have been using Iodoflex under compression. 04/05/2021: Left lateral posterior calf. She continues to have significant amounts of drainage, some of which is probably comprised of the Iodoflex microbleeds. There is no odor to the drainage. The satellite lesion on the more posterior aspect of the calf is epithelializing nicely and has contracted quite a bit. There is robust granulation tissue in the dominant wound with minimal adherent slough. 04/12/2021: The satellite lesion has nearly closed. She continues to have good granulation tissue with minimal slough of the larger primary wound. She continues to have a fair amount of drainage, but I think this is less secondary to switching her dressing from Iodoflex to Prisma. 04/19/2021: The satellite lesion is almost completely epithelialized. The larger primary wound continues to contract with good granulation tissue and minimal slough. She is currently in [place] with compression. 04/26/2021: The satellite lesion has closed. The larger primary wound has contracted further and the granulation tissue is robust without being hypertrophic. Minimal slough is present. 05/03/2021: The satellite lesion remains closed. The larger primary wound has a bit of slough, but has also contracted further  with good perimeter epithelialization. Good granulation tissue at the wound surface. There was some greenish drainage on the dressing when it was removed; it is not the blue-green typically associated with Pseudomonas aeruginosa, however. 05/10/2021: The primary wound continues to contract. There is minimal slough. The granulation tissue at 9:00 is a bit hypertrophic. No significant drainage. No odor. 05/17/2021: The wound is a little bit smaller today with good  granulation tissue. Minimal slough. No significant drainage or odor. 05/24/2021: The wound continues to contract and has a nice base of granulation tissue. Small amount of slough. No concern for infection. 05/31/2021: For some reason, the wound measured slightly larger today but overall it still appears to be in good condition with a nice base of granulation tissue and minimal slough. 06/07/2021: The wound is smaller today. Good granulation tissue and minimal slough. 06/14/2021: The wound is unchanged in size. She continues to accumulate some slough. Good granulation tissue on the surface. 06/20/2021: The wound is smaller today. Minimal slough with good granulation tissue. 06/27/2021: The wound on her left lateral leg is smaller today with just a bit of slough and eschar accumulation. Unfortunately, she has opened a new superficial wound on her right lower extremity. She has 3+ pitting edema to the knees on that leg and she does not wear compression stockings. [name], [name] ([id number]) [id number] Page 3 of 10 07/04/2021: In addition to the superficial wound on her right lower extremity that she opened up last week, she has opened 3 additional sites on the same leg. Her compression wraps were clearly not in correct position when she came to clinic today as they were at about the mid calf level rather than to the tibial tuberosity. She says that they slipped earlier and she had to come back on Friday to have them redone. The wound on her left lateral leg is perhaps slightly larger with some slough accumulation. 07/11/2021: The superficial wounds on her right lower extremity are nearly closed. They just have a thin layer of eschar overlying them. The wound on her left lateral leg is a little bit shallower and has a bit of slough accumulation. 07/17/2021: The right medial lower extremity leg wounds have almost completely closed; one of them just has a tiny opening with a little bit  of serous drainage. The left lateral leg wound is really unchanged. It seems to be stalled. 07/24/2021: The right leg wounds are completely closed. The left lateral leg wound is actually bigger today. It is a bit more tender. There is a minor accumulation of slough on the surface. 07/31/2021: The culture that I took last week was positive for MRSA. We added mupirocin under the silver alginate and I prescribed doxycycline. She has been tolerating this well. The wound is smaller today but does have slough accumulation. No significant pain or drainage. 08/07/2021: She completed her course of doxycycline. The wound looks much better today. It is smaller and the periwound is less inflamed. She does have some slough accumulation on the surface. 08/14/2021: The wound continues to contract. There is slough on the wound surface, but the periwound is intact without inflammation or induration. 08/21/2021: The wound is down to just 2 small open sites. They both have a bit of slough accumulation. 08/28/2021: The wound is down to just 1 small open site with a little bit of slough and eschar accumulation. 09/04/2021: The wound continues to contract but remains open. It is clean without any slough. 09/12/2021: No real change in the overall wound  dimensions, but it is flush with the surrounding skin. There is a little bit of slough accumulation. Edema control is good. 09/22/2021: The wound is smaller and even more superficial. Minimal slough accumulation. Good control of edema. 09/29/2021: The wound continues to contract. She reports that it was a little bit "twingey" during the week. It is a little bit dry on inspection today. Light accumulation of slough. Good edema control. 10/06/2021: The wound is about the same size today. There is a little slough on the surface. Edema control is good. 10/13/2021: The wound is about the same size but more superficial. A little bit of slough on the surface. 10/23/2021: The wound is slightly  smaller and continues to fill in. Minimal slough on the wound surface. 10/30/2021: The wound continues to contract but has not yet closed. Light slough and eschar present. 11/06/2021: The wound persists, but seems a little bit more epithelialized. There is still slough and eschar accumulation. 11/14/2021: The wound continues to contract but persists. Light eschar around the wound. 11/22/2021: The wound is down to just a pinhole opening. There is eschar overlying the surface. Edema control is excellent. 11/29/2021: Her wound is closed. Electronic Signature(s) Signed: 11/29/2021 10:53:02 AM By: [name] MD FACS Entered By: [name] on 11/29/2021 10:53:02 -------------------------------------------------------------------------------- Physical Exam Details Patient Name: Date of Service: Erin Face D. 11/29/2021 10:15 A M Medical Record Number: [id number] Patient Account Number: [id number] Date of Birth/Sex: Treating RN: 22-Jul-1955 (66 y.o. F) Primary Care Provider: [name] Other Clinician: Referring Provider: Treating Provider/Extender: [name] in Treatment: 40 Constitutional . . . . No acute distress.Marland Kitchen Respiratory Normal work of breathing on room air.. Notes 11/29/2021: Her wound is healed. [name], [name] ([id number]) [id number] Page 4 of 10 Electronic Signature(s) Signed: 11/29/2021 10:53:33 AM By: [name] MD FACS Entered By: [name] on 11/29/2021 10:53:33 -------------------------------------------------------------------------------- Physician Orders Details Patient Name: Date of Service: Erin Gerold [name] 11/29/2021 10:15 A M Medical Record Number: [id number] Patient Account Number: [id number] Date of Birth/Sex: Treating RN: 1955/03/25 (66 y.o. [name] Primary Care Provider: [name] Other Clinician: Referring Provider: Treating Provider/Extender: [name] in Treatment: [phone] Verbal / Phone Orders: No Diagnosis Coding ICD-10 Coding Code Description [phone] Chronic venous hypertension (idiopathic) with ulcer and inflammation of left lower extremity L97.828 Non-pressure chronic ulcer of other part of left lower leg with other specified severity Discharge From [facility] Discharge from [place] your wound is healed! Wound Treatment Electronic Signature(s) Signed: 11/29/2021 10:53:42 AM By: [name] MD FACS Entered By: [name] on 11/29/2021 10:53:41 -------------------------------------------------------------------------------- Problem List Details Patient Name: Date of Service: Erin Face D. 11/29/2021 10:15 A M Medical Record Number: [id number] Patient Account Number: [id number] Date of Birth/Sex: Treating RN: 1955/04/18 (66 y.o. F) Primary Care Provider: [name] Other Clinician: Referring Provider: Treating Provider/Extender: [name] in Treatment: 41 Active Problems ICD-10 Encounter Code Description Active Date MDM Diagnosis 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 (idiopathic) with ulcer and inflammation of left 02/21/2021 No Yes lower extremity L97.828 Non-pressure chronic ulcer of other part of left lower leg with other specified 02/21/2021 No Yes severity Inactive Problems BRITTNAE, ASCHENBRENNER ([id number]) [id number] Page 5 of 10 ICD-10 Code Description Active Date Inactive Date L03.116 Cellulitis of left lower limb 03/08/2021 03/08/2021 L97.811 Non-pressure chronic ulcer of other part of right lower leg limited to breakdown of skin 06/27/2021 06/27/2021 Resolved Problems Electronic Signature(s) Signed: 11/29/2021 10:52:38 AM  By: [name] MD FACS Entered By: [name] on 11/29/2021 10:52:38 -------------------------------------------------------------------------------- Progress Note  Details Patient Name: Date of Service: Erin Gerold [name] 11/29/2021 10:15 A M Medical Record Number: [id number] Patient Account Number: [id number] Date of Birth/Sex: Treating RN: [birth year] (66 y.o. F) Primary Care Provider: [name] Other Clinician: Referring Provider: Treating Provider/Extender: [name] in Treatment: 74 Subjective Chief Complaint Information obtained from Patient 04/19/2021: The patient is here for ongoing follow-up regarding 2 left lower extremity wounds. 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 She tells [name] that she fell on the gravel driveway in July. She developed injuries on her distal lower leg which have not healed. She saw her primary physician on 11/15/2017 who noted her left shin injuries. Gave her antibiotics. At that point the wounds were almost circumferential however most were less than 1.5 cm. Weeping edema fluid was noted. She was referred here for evaluation. The patient has a history of chronic lower extremity edema. 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 She has had prior history with leg weeping fluid. She does not wear compression stockings. She is not doing anything specific to these wound areas. 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 She also has lymphedema. Put her in 3 layer compression and silver alginate last week. She has 3 small wounds with depth just lateral to the tibia. More problematically than this she has numerous shallow  areas some of which are almost canal like in shape with tightly adherent painful debris. It would be very difficult and time- consuming to go through this and attempt to individually debride all these areas.. I changed her to collagen today to see if that would help with any of the surface debris on some of these wounds. Otherwise we will not be able to put this in compression we have to have someone change the dressing. The patient is not eligible for home health 12/25/17 on evaluation today patient actually appears to be doing rather well in regard to the ulcer on her lower extremity. Fortunately there does not appear to be evidence of infection at this time. She has been tolerating the dressing changes without complication. This includes the compression wrap. The only issue she had was that the wrap was initially placed over her bunion region which actually calls her some discomfort and pain. Other than that things seem to be going rather well. 01/01/2018 Seen today for follow-up and management of left lower extremity wound and lymphedema. T oday she presents with a new wound towards to the left lateral LE. Recently treated with a 7 day course of amoxicillin; reason for antibiotic dose is unknown at this time. Tolerating current treatment of collagen with 4- layer wraps. She obtained a venous reflux study on 12/25/17. Studies show on the right abnormal reflux times of the popliteal vein, great saphenous vein at the saphenofemoral junction at the proximal thigh, great saphenous vein at the mid calf, and origin of the small saphenous vein.No superficial thrombosis. No deep vein thrombosis in the common femoral, femoral,and popliteal veins. Left abnormal  reflex times as well of the common femoral vein, popliteal vein, and a great saphenous vein at the saphenofemoral junction, In great saphenous vein at the mid thigh w/o thrombosis. Has any issues or concerns during visit today. Recommended follow-up to  vascular specialist due to abnormalities from the venous reflux study. Denies fever, pain, chills, dizziness, nausea, or vomiting. 01/08/18 upon evaluation today the patient actually seems to be showing some signs of improvement in my opinion at this point in regard to the lower extremity ulcerated areas. She still has a lot of drainage but fortunately nothing that appears to be too significant currently. I have been very happy with the overall progress I see today compared to where things were during the last evaluation that I had with her. Nonetheless she has not had her appointment with the vein specialist as of yet in fact we were able to get this approved for her today and confirmed with them she will be seeing them on December 26. Nonetheless in general I do feel like the compression wraps is doing well for her. 01/17/18; quite a bit of improvement since last time I saw this patient she has a small open area remaining on the left lateral calf and even smaller area medially. She has lymphedema chronic stasis changes with distal skin fibrosis. She has an appointment with vascular surgery later this month 01/24/2018; the patient's medial leg has closed. Still a small open area on the left lateral leg. She states that the 4 layer compression we put on last week was [name], [name] ([id number]) [id number] Page 6 of 10 too tight and she had to take it off over a few days ago. We have had resultant increase in her lymphedema in the dorsal foot and a proximal calf. Fortunately that does not seem to have resulted in any deterioration in her wounds The patient is going to need compression stockings. We have given her measurements to phone elastic therapy in [place]. She has vascular surgery consult on December 26 02/07/18; she's had continuous contraction on the left lateral leg wound which is now very small. Currently using silver alginate under 3 layer compression She saw  Dr. Trula Slade of vascular surgery on 02/06/18. It was noted that she had a normal reflux times in the popliteal vein, great saphenous vein at the saphenofemoral junction, great saphenous vein at the proximal thigh great saphenous vein at the mid calf and origin of the small saphenous vein. It was noted that she had significant reflux in the left saphenous veins with diameter measurements in the 0.7-0.8 cm range. It was felt she would benefit from laser ablation to help minimize the risk of ulcer recurrence. It was recommended that she wear 20-30 thigh-high compression stockings and have follow-up in 4-6 weeks 02/17/2018; I thought this lady would be healed however her compression slipped down and she developed increasing swelling and the wound is actually larger. 1/13; we had deterioration last week after the patient's compression slipped down and she developed periwound swelling. I increased her compression before layers we have been using silver alginate we are a lot better again today. She has her compression stockings in waiting 1/23; the patient's wounds are totally healed today. She has her stockings. This was almost circumferential skin damage. She follows up with Dr. Trula Slade of vascular surgery next Monday. READMISSION 02/21/2021 This is a now 66 year old woman that we had in clinic here discharging in January 2020 with wounds on her left calf chronic venous insufficiency. She was  discharged with 30/40 stockings. It does not sound like she has worn stockings in about a year largely from not being able to get them on herself. In November she developed new blisters on her legs an area laterally is opened into a fairly sizable wound. She has weeping posteriorly as well. She has been using Neosporin and Band-Aids. The patient did see Dr. Trula Slade in 2019 and 2020. He felt she might benefit from laser ablation in her left saphenous veins although because of the wound that healed I do not think he went  through with it. She might benefit from seeing him again. Her ABI on the left is 1. 1/17; patient's wound on the posterior left calf is closed she has the 2 large areas last week. We put her in 4-layer compression for edema control is a lot better. Our intake nurse noted greenish drainage and odor. We have been using silver alginate 1/25; PCR culture I did have the substantial wound area on the left lateral lower leg showed staff aureus and group A strep. Low titers of coag negative staph which are probably skin contaminants. Resistance detected to tetracycline methicillin and macrolides. I gave her a starter kit of Nuzyra 150 mg x 3 for 2 days then 300 mg for a further 7 days. Marked odor considerable increase in surrounding erythema. We are using Iodoflex last week however I have changed to silver alginate with underlying Bactroban 2/1; she is completing her [place] tomorrow. The degree of erythema around the wounds looks a lot better. Odor has improved. I gave her silver alginate and Bactroban last week and changing her back to Iodoflex to continue with ongoing debridement 2/8; the periwound looks a lot better. Surface of the wound also looks somewhat better although there is still ongoing debridement to be done we have been using Iodoflex under compression. Primary dressing and silver alginate 2/15; left posterior calf. Some improvement in the surface of the wound but still very gritty we have been using Iodoflex under compression. 04/05/2021: Left lateral posterior calf. She continues to have significant amounts of drainage, some of which is probably comprised of the Iodoflex microbleeds. There is no odor to the drainage. The satellite lesion on the more posterior aspect of the calf is epithelializing nicely and has contracted quite a bit. There is robust granulation tissue in the dominant wound with minimal adherent slough. 04/12/2021: The satellite lesion has nearly closed. She continues to have  good granulation tissue with minimal slough of the larger primary wound. She continues to have a fair amount of drainage, but I think this is less secondary to switching her dressing from Iodoflex to Prisma. 04/19/2021: The satellite lesion is almost completely epithelialized. The larger primary wound continues to contract with good granulation tissue and minimal slough. She is currently in [place] with compression. 04/26/2021: The satellite lesion has closed. The larger primary wound has contracted further and the granulation tissue is robust without being hypertrophic. Minimal slough is present. 05/03/2021: The satellite lesion remains closed. The larger primary wound has a bit of slough, but has also contracted further with good perimeter epithelialization. Good granulation tissue at the wound surface. There was some greenish drainage on the dressing when it was removed; it is not the blue-green typically associated with Pseudomonas aeruginosa, however. 05/10/2021: The primary wound continues to contract. There is minimal slough. The granulation tissue at 9:00 is a bit hypertrophic. No significant drainage. No odor. 05/17/2021: The wound is a little bit smaller today with good  granulation tissue. Minimal slough. No significant drainage or odor. 05/24/2021: The wound continues to contract and has a nice base of granulation tissue. Small amount of slough. No concern for infection. 05/31/2021: For some reason, the wound measured slightly larger today but overall it still appears to be in good condition with a nice base of granulation tissue and minimal slough. 06/07/2021: The wound is smaller today. Good granulation tissue and minimal slough. 06/14/2021: The wound is unchanged in size. She continues to accumulate some slough. Good granulation tissue on the surface. 06/20/2021: The wound is smaller today. Minimal slough with good granulation tissue. 06/27/2021: The wound on her left lateral leg is smaller today  with just a bit of slough and eschar accumulation. Unfortunately, she has opened a new superficial wound on her right lower extremity. She has 3+ pitting edema to the knees on that leg and she does not wear compression stockings. 07/04/2021: In addition to the superficial wound on her right lower extremity that she opened up last week, she has opened 3 additional sites on the same leg. Her compression wraps were clearly not in correct position when she came to clinic today as they were at about the mid calf level rather than to the tibial tuberosity. She says that they slipped earlier and she had to come back on Friday to have them redone. The wound on her left lateral leg is perhaps slightly larger with some slough accumulation. 07/11/2021: The superficial wounds on her right lower extremity are nearly closed. They just have a thin layer of eschar overlying them. The wound on her left lateral leg is a little bit shallower and has a bit of slough accumulation. 07/17/2021: The right medial lower extremity leg wounds have almost completely closed; one of them just has a tiny opening with a little bit of serous drainage. The left lateral leg wound is really unchanged. It seems to be stalled. [name], [name] ([id number]) [id number] Page 7 of 10 07/24/2021: The right leg wounds are completely closed. The left lateral leg wound is actually bigger today. It is a bit more tender. There is a minor accumulation of slough on the surface. 07/31/2021: The culture that I took last week was positive for MRSA. We added mupirocin under the silver alginate and I prescribed doxycycline. She has been tolerating this well. The wound is smaller today but does have slough accumulation. No significant pain or drainage. 08/07/2021: She completed her course of doxycycline. The wound looks much better today. It is smaller and the periwound is less inflamed. She does have some slough accumulation on the  surface. 08/14/2021: The wound continues to contract. There is slough on the wound surface, but the periwound is intact without inflammation or induration. 08/21/2021: The wound is down to just 2 small open sites. They both have a bit of slough accumulation. 08/28/2021: The wound is down to just 1 small open site with a little bit of slough and eschar accumulation. 09/04/2021: The wound continues to contract but remains open. It is clean without any slough. 09/12/2021: No real change in the overall wound dimensions, but it is flush with the surrounding skin. There is a little bit of slough accumulation. Edema control is good. 09/22/2021: The wound is smaller and even more superficial. Minimal slough accumulation. Good control of edema. 09/29/2021: The wound continues to contract. She reports that it was a little bit "twingey" during the week. It is a little bit dry on inspection today. Light accumulation of slough. Good edema  control. 10/06/2021: The wound is about the same size today. There is a little slough on the surface. Edema control is good. 10/13/2021: The wound is about the same size but more superficial. A little bit of slough on the surface. 10/23/2021: The wound is slightly smaller and continues to fill in. Minimal slough on the wound surface. 10/30/2021: The wound continues to contract but has not yet closed. Light slough and eschar present. 11/06/2021: The wound persists, but seems a little bit more epithelialized. There is still slough and eschar accumulation. 11/14/2021: The wound continues to contract but persists. Light eschar around the wound. 11/22/2021: The wound is down to just a pinhole opening. There is eschar overlying the surface. Edema control is excellent. 11/29/2021: Her wound is closed. Patient History Information obtained from Patient. Family History Heart Disease - Mother,Father, Hypertension - Mother,Father, Kidney Disease - Mother, Thyroid Problems - Mother, No family history  of Cancer, Diabetes, Hereditary Spherocytosis, Lung Disease, Seizures, Stroke, Tuberculosis. Social History Never smoker, Marital Status - Single, Alcohol Use - Never, Drug Use - No History, Caffeine Use - Daily - coffee. Medical History Eyes Patient has history of Cataracts Hematologic/Lymphatic Patient has history of Lymphedema Cardiovascular Patient has history of Hypertension, Peripheral Venous Disease Integumentary (Skin) Denies history of History of Burn Musculoskeletal Patient has history of Osteoarthritis Hospitalization/Surgery History - colostomy reversal. 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 Medical A Surgical History Notes nd Constitutional Symptoms (General Health) morbid obesity Cardiovascular schamberg disease, hyperlipidemia Gastrointestinal diverticulitis , h/o obstruction due to diverticulitis , colostomy and colostomy reversal Endocrine hypothyroidism Objective Constitutional No acute distress.[name], [name] ([id number]) [id number] Page 8 of 10 Vitals Time Taken: 10:00 AM, Height: 62 in, Weight: 226 lbs, BMI: 41.3, Temperature: 97.9 F, Pulse: 73 bpm, Respiratory Rate: 20 breaths/min, Blood Pressure: 130/68 mmHg. Respiratory Normal work of breathing on room air.. General Notes: 11/29/2021: Her wound is healed. Integumentary (Hair, Skin) Wound #3 status is Open. Original cause of wound was Blister. The date acquired was: 12/13/2020. The wound has been in treatment 40 weeks. The wound is located on the Left,Lateral Lower Leg. The wound measures 0.1cm length x 0.1cm width x 0.1cm depth; 0.008cm^2 area and 0.001cm^3 volume. There is Fat Layer (Subcutaneous Tissue) exposed. There is no tunneling or undermining noted. There is a medium amount of serosanguineous drainage noted. The wound margin is distinct with the outline attached to the wound base. There is small (1-33%) pink granulation within the wound bed. There is a large  (67-100%) amount of necrotic tissue within the wound bed including Eschar and Adherent Slough. The periwound skin appearance had no abnormalities noted for texture. The periwound skin appearance had no abnormalities noted for color. The periwound skin appearance did not exhibit: Dry/Scaly, Maceration. Periwound temperature was noted as No Abnormality. Assessment Active Problems ICD-10 Chronic venous hypertension (idiopathic) with ulcer and inflammation of left lower extremity Non-pressure chronic ulcer of other part of left lower leg with other specified severity Plan Discharge From [facility]: Discharge from [facility] your wound is healed! 11/29/2021: Her wound is healed. She has ordered compression stockings from the shop in [place] and is just waiting for them to be shipped to her home. She did order 30 to 40 mmHg compression, which I think is appropriate. She was reminded to keep her legs elevated is much as possible during the day and at night. We will discharge her from the wound care center. She may follow-up as needed. Electronic Signature(s) Signed: 11/29/2021 10:54:19  AM By: [name] MD FACS Entered By: [name] on 11/29/2021 10:54:19 -------------------------------------------------------------------------------- HxROS Details Patient Name: Date of Service: Erin Gerold [name] 11/29/2021 10:15 A M Medical Record Number: [id number] Patient Account Number: [id number] Date of Birth/Sex: Treating RN: 1955/06/30 (66 y.o. F) Primary Care Provider: [name] Other Clinician: Referring Provider: Treating Provider/Extender: [name] in Treatment: 23 Information Obtained From Patient Constitutional Symptoms (General Health) Medical History: Past Medical History Notes: morbid obesity Eyes Medical HistoryDEJANE, [name] ([id number]) [id number] Page 9 of 10 Positive for:  Cataracts Hematologic/Lymphatic Medical History: Positive for: Lymphedema Cardiovascular Medical History: Positive for: Hypertension; Peripheral Venous Disease Past Medical History Notes: schamberg disease, hyperlipidemia Gastrointestinal Medical History: Past Medical History Notes: diverticulitis , h/o obstruction due to diverticulitis , colostomy and colostomy reversal Endocrine Medical History: Past Medical History Notes: hypothyroidism Integumentary (Skin) Medical History: Negative for: History of Burn Musculoskeletal Medical History: Positive for: Osteoarthritis HBO Extended History Items Eyes: Cataracts Immunizations Pneumococcal Vaccine: Received Pneumococcal Vaccination: Yes Received Pneumococcal Vaccination On or After 60th Birthday: Yes Implantable Devices None Hospitalization / Surgery History Type of Hospitalization/Surgery colostomy reversal 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 Family and Social History Cancer: No; Diabetes: No; Heart Disease: Yes - Mother,Father; Hereditary Spherocytosis: No; Hypertension: Yes - Mother,Father; Kidney Disease: Yes - Mother; Lung Disease: No; Seizures: No; Stroke: No; Thyroid Problems: Yes - Mother; Tuberculosis: No; Never smoker; Marital Status - Single; Alcohol Use: Never; Drug Use: No History; Caffeine Use: Daily - coffee; Financial Concerns: No; Food, Clothing or Shelter Needs: No; Support System Lacking: No; Transportation Concerns: No Electronic Signature(s) Signed: 11/29/2021 12:35:35 PM By: [name] MD FACS Entered By: [name] on 11/29/2021 10:53:08 -------------------------------------------------------------------------------- SuperBill Details Patient Name: Date of Service: Erin Face D. 11/29/2021 Medical Record Number: [id number] Patient Account Number: [id number] [name], [name] ([id number]) [id number] Page 10 of 10 Date of Birth/Sex: Treating RN: 06-Nov-1955 (66  y.o. [name] Primary Care Provider: [name] Other Clinician: Referring Provider: Treating Provider/Extender: [name] in Treatment: 40 Diagnosis Coding ICD-10 Codes Code Description [phone] Chronic venous hypertension (idiopathic) with ulcer and inflammation of left lower extremity L97.828 Non-pressure chronic ulcer of other part of left lower leg with other specified severity Facility Procedures : CPT4 Code: [id number] Description: 62376 - WOUND CARE VISIT-LEV 3 EST PT Modifier: Quantity: 1 Physician Procedures : CPT4 Code Description Modifier [id number] - WC PHYS LEVEL 3 - EST PT ICD-10 Diagnosis Description 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 (idiopathic) with ulcer and inflammation of left lower extremity L97.828 Non-pressure chronic ulcer of other part  of left lower leg with other specified severity Quantity: 1 Electronic Signature(s) Signed: 11/29/2021 10:54:30 AM By: [name] MD FACS Entered By: [name] on 11/29/2021 10:54:30

## 2021-12-01 NOTE — Progress Notes (Signed)
GLORIANNA, GOTT ([id number]) [id number] Page 1 of 8 Visit Report for 11/29/2021 Arrival Information Details Patient Name: Date of Service: [name], [name] 11/29/2021 10:15 A M Medical Record Number: [id number] Patient Account Number: [id number] Date of Birth/Sex: Treating RN: 12-01-1955 (66 y.o. F) Primary Care Cylis Ayars: [name] Other Clinician: Referring Trini Christiansen: Treating Abel Hageman/Extender: [name] in Treatment: 46 Visit Information History Since Last Visit All ordered tests and consults were completed: No Patient Arrived: Ambulatory Added or deleted any medications: No Arrival Time: 10:09 Any new allergies or adverse reactions: No Transfer Assistance: None Had a fall or experienced change in No Patient Requires Transmission-Based Precautions: No activities of daily living that may affect Patient Has Alerts: No risk of falls: Signs or symptoms of abuse/neglect since last visito No Hospitalized since last visit: No Pain Present Now: No Electronic Signature(s) Signed: 12/01/2021 11:34:44 AM By: Worthy Rancher Entered By: Worthy Rancher on 11/29/2021 10:10:03 -------------------------------------------------------------------------------- Clinic Level of Care Assessment Details Patient Name: Date of Service: [name] 11/29/2021 10:15 A M Medical Record Number: [id number] Patient Account Number: [id number] Date of Birth/Sex: Treating RN: 06-May-1955 (66 y.o. [name] Primary Care Nattalie Santiesteban: [name] Other Clinician: Referring Idriss Quackenbush: Treating Ledora Delker/Extender: [name] in Treatment: 18 Clinic Level of Care Assessment Items TOOL 4 Quantity Score X- 1 0 Use when only an EandM is performed on FOLLOW-UP visit ASSESSMENTS - Nursing Assessment / Reassessment X- 1 10 Reassessment of Co-morbidities (includes updates in patient status) X- 1 5 Reassessment of  Adherence to Treatment Plan ASSESSMENTS - Wound and Skin A ssessment / Reassessment X - Simple Wound Assessment / Reassessment - one wound 1 5 '[]'$  - 0 Complex Wound Assessment / Reassessment - multiple wounds '[]'$  - 0 Dermatologic / Skin Assessment (not related to wound area) ASSESSMENTS - Focused Assessment '[]'$  - 0 Circumferential Edema Measurements - multi extremities '[]'$  - 0 Nutritional Assessment / Counseling / Intervention '[]'$  - 0 Lower Extremity Assessment (monofilament, tuning fork, pulses) '[]'$  - 0 Peripheral Arterial Disease Assessment (using hand held dopplerMYCA, PERNO D ([id number]) [id number] Page 2 of 8 ASSESSMENTS - Ostomy and/or Continence Assessment and Care '[]'$  - 0 Incontinence Assessment and Management '[]'$  - 0 Ostomy Care Assessment and Management (repouching, etc.) PROCESS - Coordination of Care X - Simple Patient / Family Education for ongoing care 1 15 '[]'$  - 0 Complex (extensive) Patient / Family Education for ongoing care X- 1 10 Staff obtains Programmer, systems, Records, T Results / Process Orders est X- 1 10 Staff telephones HHA, Nursing Homes / Clarify orders / etc '[]'$  - 0 Routine Transfer to another Facility (non-emergent condition) '[]'$  - 0 Routine Hospital Admission (non-emergent condition) '[]'$  - 0 New Admissions / Biomedical engineer / Ordering NPWT Apligraf, etc. , '[]'$  - 0 Emergency Hospital Admission (emergent condition) X- 1 10 Simple Discharge Coordination '[]'$  - 0 Complex (extensive) Discharge Coordination PROCESS - Special Needs '[]'$  - 0 Pediatric / Minor Patient Management '[]'$  - 0 Isolation Patient Management '[]'$  - 0 Hearing / Language / Visual special needs '[]'$  - 0 Assessment of Community assistance (transportation, D/C planning, etc.) '[]'$  - 0 Additional assistance / Altered mentation '[]'$  - 0 Support Surface(s) Assessment (bed, cushion, seat, etc.) INTERVENTIONS - Wound Cleansing / Measurement X - Simple Wound Cleansing  - one wound 1 5 '[]'$  - 0 Complex Wound Cleansing - multiple wounds X- 1 5 Wound Imaging (photographs - any number of wounds) '[]'$  - 0 Wound Tracing (instead of  photographs) X- 1 5 Simple Wound Measurement - one wound '[]'$  - 0 Complex Wound Measurement - multiple wounds INTERVENTIONS - Wound Dressings X - Small Wound Dressing one or multiple wounds 1 10 '[]'$  - 0 Medium Wound Dressing one or multiple wounds '[]'$  - 0 Large Wound Dressing one or multiple wounds '[]'$  - 0 Application of Medications - topical '[]'$  - 0 Application of Medications - injection INTERVENTIONS - Miscellaneous '[]'$  - 0 External ear exam '[]'$  - 0 Specimen Collection (cultures, biopsies, blood, body fluids, etc.) '[]'$  - 0 Specimen(s) / Culture(s) sent or taken to Lab for analysis '[]'$  - 0 Patient Transfer (multiple staff / Civil Service fast streamer / Similar devices) '[]'$  - 0 Simple Staple / Suture removal (25 or less) '[]'$  - 0 Complex Staple / Suture removal (26 or more) '[]'$  - 0 Hypo / Hyperglycemic Management (close monitor of Blood Glucose) '[]'$  - 0 Ankle / Brachial Index (ABI) - do not check if billed separately X- 1 5 Vital Signs BRYANNA, YIM ([id number]) [id number] Page 3 of 8 Has the patient been seen at the hospital within the last three years: Yes Total Score: 95 Level Of Care: New/Established - Level 3 Electronic Signature(s) Signed: 11/29/2021 12:37:23 PM By: [name] RN Entered By: [name] on 11/29/2021 10:54:09 -------------------------------------------------------------------------------- Encounter Discharge Information Details Patient Name: Date of Service: Erin Face D. 11/29/2021 10:15 A M Medical Record Number: [id number] Patient Account Number: [id number] Date of Birth/Sex: Treating RN: 1955/07/02 (66 y.o. [name] Primary Care Sidrah Harden: [name] Other Clinician: Referring Janai Maudlin: Treating Ki Corbo/Extender: [name] in  Treatment: 88 Encounter Discharge Information Items Discharge Condition: Stable Ambulatory Status: Cane Discharge Destination: Home Transportation: Private Auto Accompanied By: self Schedule Follow-up Appointment: Yes Clinical Summary of Care: Patient Declined Electronic Signature(s) Signed: 11/29/2021 12:37:23 PM By: [name] RN Entered By: [name] on 11/29/2021 10:54:49 -------------------------------------------------------------------------------- Lower Extremity Assessment Details Patient Name: Date of Service: ALBIRTHA, GRINAGE D. 11/29/2021 10:15 A M Medical Record Number: [id number] Patient Account Number: [id number] Date of Birth/Sex: Treating RN: 04-19-1955 (66 y.o. [name] Primary Care Jevon Shells: [name] Other Clinician: Referring Granvel Proudfoot: Treating Camrin Lapre/Extender: [name] in Treatment: 40 Edema Assessment Assessed: [Left: No] [Right: No] Edema: [Left: Ye] [Right: s] Calf Left: Right: Point of Measurement: 32 cm From Medial Instep 41.5 cm 44 cm Ankle Left: Right: Point of Measurement: 10 cm From Medial Instep 21.5 cm 25 cm Knee To Floor Left: Right: From Medial Instep 41 cm 41 cm Vascular Assessment Left: [[id number] Page 4 of 8Right:] Pulses: Dorsalis Pedis Palpable: [[id number] Page 4 of 8Yes Yes] Electronic Signature(s) Signed: 11/29/2021 12:37:23 PM By: [name] RN Entered By: [name] on 11/29/2021 10:27:51 -------------------------------------------------------------------------------- Multi Wound Chart Details Patient Name: Date of Service: Erin Face D. 11/29/2021 10:15 A M Medical Record Number: [id number] Patient Account Number: [id number] Date of Birth/Sex: Treating RN: 11-03-1955 (66 y.o. F) Primary Care Marella Vanderpol: [name] Other Clinician: Referring Auriel Kist: Treating Nashid Pellum/Extender: [name] in Treatment: 40 Vital Signs Height(in): 58 Pulse(bpm): 29 Weight(lbs): 226 Blood Pressure(mmHg): 130/68 Body Mass Index(BMI): 41.3 Temperature(F): 97.9 Respiratory Rate(breaths/min): 20 [3:Photos:] [N/A:N/A] Left, Lateral Lower Leg N/A N/A Wound Location: Blister N/A N/A Wounding Event: Venous Leg Ulcer N/A N/A Primary Etiology: Cataracts, Lymphedema, N/A N/A Comorbid History: Hypertension, Peripheral Venous Disease, Osteoarthritis 12/13/2020 N/A N/A Date Acquired: 40 N/A N/A Weeks of Treatment: Open N/A N/A Wound Status: No N/A N/A Wound Recurrence: 0.1x0.1x0.1 N/A N/A  Measurements L x W x D (cm) 0.008 N/A N/A A (cm) : rea 0.001 N/A N/A Volume (cm) : 100.00% N/A N/A % Reduction in Area: 100.00% N/A N/A % Reduction in Volume: Full Thickness Without Exposed N/A N/A Classification: Support Structures Medium N/A N/A Exudate A mount: Serosanguineous N/A N/A Exudate Type: red, brown N/A N/A Exudate Color: Distinct, outline attached N/A N/A Wound Margin: Small (1-33%) N/A N/A Granulation Amount: Pink N/A N/A Granulation Quality: Large (67-100%) N/A N/A Necrotic Amount: Eschar, Adherent Slough N/A N/A Necrotic Tissue: Fat Layer (Subcutaneous Tissue): Yes N/A N/A Exposed Structures: Fascia: No Tendon: No Muscle: No Joint: No Bone: No Large (67-100%) N/A N/A Epithelialization: No Abnormalities Noted N/A N/A Periwound Skin Texture: Maceration: No N/A N/A [address]LOU, IRIGOYEN ([id number]) [id number] Page 5 of 8 Dry/Scaly: No No Abnormalities Noted N/A N/A Periwound Skin Color: No Abnormality N/A N/A Temperature: Treatment Notes Electronic Signature(s) Signed: 11/29/2021 10:52:43 AM By: [name] MD FACS Entered By: [name] on 11/29/2021 10:52:43 -------------------------------------------------------------------------------- Multi-Disciplinary Care  Plan Details Patient Name: Date of Service: Erin Face D. 11/29/2021 10:15 A M Medical Record Number: [id number] Patient Account Number: [id number] Date of Birth/Sex: Treating RN: 08-03-1955 (66 y.o. [name] Primary Care Macon Sandiford: [name] Other Clinician: Referring Rayola Everhart: Treating Kawon Willcutt/Extender: [name] in Treatment: [place] reviewed with physician Active Inactive Electronic Signature(s) Signed: 11/29/2021 12:37:23 PM By: [name] RN Entered By: [name] on 11/29/2021 10:53:00 -------------------------------------------------------------------------------- Pain Assessment Details Patient Name: Date of Service: ROSEALEE, RECINOS 11/29/2021 10:15 A M Medical Record Number: [id number] Patient Account Number: [id number] Date of Birth/Sex: Treating RN: 03/04/1955 (66 y.o. F) Primary Care Sumeet Geter: [name] Other Clinician: Referring Becca Bayne: Treating Malyna Budney/Extender: [name] in Treatment: 40 Active Problems Location of Pain Severity and Description of Pain Patient Has Paino No Site Locations KOYA, HUNGER D ([id number]) [id number] Page 6 of 8 Pain Management and Medication Current Pain Management: Electronic Signature(s) Signed: 12/01/2021 11:34:44 AM By: Worthy Rancher Entered By: Worthy Rancher on 11/29/2021 10:11:33 -------------------------------------------------------------------------------- Patient/Caregiver Education Details Patient Name: Date of Service: [name] 10/18/2023andnbsp10:15 A M Medical Record Number: [id number] Patient Account Number: [id number] Date of Birth/Gender: Treating RN: 1955-09-03 (66 y.o. [name] Primary Care Physician: [name] Other Clinician: Referring Physician: Treating Physician/Extender: [name] in Treatment: 87 Education  Assessment Education Provided To: Patient Education Topics Provided Wound/Skin Impairment: Methods: Explain/Verbal Responses: Return demonstration correctly Electronic Signature(s) Signed: 11/29/2021 12:37:23 PM By: [name] RN Entered By: [name] on 11/29/2021 10:53:14 -------------------------------------------------------------------------------- Wound Assessment Details Patient Name: Date of Service: Erin Face D. 11/29/2021 10:15 A M Medical Record Number: [id number] Patient Account Number: [id number] Date of Birth/Sex: Treating RN: January 07, 1956 (66 y.o. [name] Primary Care Valerio Pinard: [name] Other Clinician: Referring Reise Hietala: Treating Nikkie Liming/Extender: Lylah, Lantis D ([id number]) [id number] Page 7 of 8 Weeks in Treatment: 40 Wound Status Wound Number: 3 Primary Venous Leg Ulcer Etiology: Wound Location: Left, Lateral Lower Leg Wound Healed - Epithelialized Wounding Event: Blister Status: Date Acquired: 12/13/2020 Comorbid Cataracts, Lymphedema, Hypertension, Peripheral Venous Weeks Of Treatment: 40 History: Disease, Osteoarthritis Clustered Wound: No Photos Wound Measurements Length: (cm) Width: (cm) Depth: (cm) Area: (cm) Volume: (cm) 0 % Reduction in Area: 100% 0 % Reduction in Volume: 100% 0 Epithelialization: Large (67-100%) 0 Tunneling: No 0 Undermining: No Wound Description Classification: Full Thickness Without Exposed Support Wound Margin: Distinct, outline  attached Exudate Amount: None Present Structures Foul Odor After Cleansing: No Slough/Fibrino No Wound Bed Granulation Amount: None Present (0%) Exposed Structure Necrotic Amount: None Present (0%) Fascia Exposed: No Fat Layer (Subcutaneous Tissue) Exposed: No Tendon Exposed: No Muscle Exposed: No Joint Exposed: No Bone Exposed: No Periwound Skin Texture Texture Color No Abnormalities  Noted: Yes No Abnormalities Noted: Yes Moisture Temperature / Pain No Abnormalities Noted: Yes Temperature: No Abnormality Treatment Notes Wound #3 (Lower Leg) Wound Laterality: Left, Lateral Cleanser Peri-Wound Care Topical Primary Dressing Secondary Dressing Secured With Compression Wrap Compression Stockings Add-Ons Electronic Signature(s) Signed: 11/29/2021 12:37:23 PM By: [name] RN [name], Renada D ([id number]) [id number] Page 8 of 8 Entered By: [name] on 11/29/2021 11:52:43 -------------------------------------------------------------------------------- Vitals Details Patient Name: Date of Service: CAMIL, HAUSMANN 11/29/2021 10:15 A M Medical Record Number: [id number] Patient Account Number: [id number] Date of Birth/Sex: Treating RN: 1955/03/24 (66 y.o. F) Primary Care Orvile Corona: [name] Other Clinician: Referring Tylerjames Hoglund: Treating Payam Gribble/Extender: [name] in Treatment: 40 Vital Signs Time Taken: 10:00 Temperature (F): 97.9 Height (in): 62 Pulse (bpm): 73 Weight (lbs): 226 Respiratory Rate (breaths/min): 20 Body Mass Index (BMI): 41.3 Blood Pressure (mmHg): 130/68 Reference Range: 80 - 120 mg / dl Electronic Signature(s) Signed: 12/01/2021 11:34:44 AM By: Worthy Rancher Entered By: Worthy Rancher on 11/29/2021 10:10:39

## 2021-12-04 ENCOUNTER — Other Ambulatory Visit (HOSPITAL_COMMUNITY): Payer: Self-pay

## 2021-12-06 ENCOUNTER — Encounter: Payer: Self-pay | Admitting: Internal Medicine

## 2021-12-11 ENCOUNTER — Ambulatory Visit: Payer: 59 | Admitting: Internal Medicine

## 2021-12-14 ENCOUNTER — Other Ambulatory Visit (HOSPITAL_COMMUNITY): Payer: Self-pay

## 2021-12-14 MED ORDER — CLENPIQ 10-3.5-12 MG-GM -GM/175ML PO SOLN
ORAL | 0 refills | Status: DC
  Filled 2021-12-14: qty 350, 1d supply, fill #0

## 2021-12-15 ENCOUNTER — Other Ambulatory Visit (HOSPITAL_COMMUNITY): Payer: Self-pay

## 2021-12-15 DIAGNOSIS — Z1231 Encounter for screening mammogram for malignant neoplasm of breast: Secondary | ICD-10-CM | POA: Diagnosis not present

## 2021-12-15 LAB — HM MAMMOGRAPHY: HM Mammogram: NORMAL (ref 0–4)

## 2021-12-21 ENCOUNTER — Other Ambulatory Visit (HOSPITAL_COMMUNITY): Payer: Self-pay

## 2021-12-21 ENCOUNTER — Other Ambulatory Visit: Payer: Self-pay | Admitting: Nurse Practitioner

## 2021-12-21 MED ORDER — SIMVASTATIN 20 MG PO TABS
20.0000 mg | ORAL_TABLET | Freq: Every day | ORAL | 0 refills | Status: DC
  Filled 2021-12-21: qty 90, 90d supply, fill #0

## 2021-12-29 DIAGNOSIS — K5730 Diverticulosis of large intestine without perforation or abscess without bleeding: Secondary | ICD-10-CM | POA: Diagnosis not present

## 2021-12-29 DIAGNOSIS — Z1211 Encounter for screening for malignant neoplasm of colon: Secondary | ICD-10-CM | POA: Diagnosis not present

## 2021-12-29 DIAGNOSIS — D122 Benign neoplasm of ascending colon: Secondary | ICD-10-CM | POA: Diagnosis not present

## 2021-12-29 DIAGNOSIS — K635 Polyp of colon: Secondary | ICD-10-CM | POA: Diagnosis not present

## 2021-12-29 LAB — HM COLONOSCOPY

## 2022-01-08 ENCOUNTER — Other Ambulatory Visit (HOSPITAL_COMMUNITY): Payer: Self-pay

## 2022-01-09 ENCOUNTER — Encounter: Payer: Self-pay | Admitting: Internal Medicine

## 2022-01-18 ENCOUNTER — Other Ambulatory Visit (HOSPITAL_COMMUNITY): Payer: Self-pay

## 2022-01-18 ENCOUNTER — Ambulatory Visit (INDEPENDENT_AMBULATORY_CARE_PROVIDER_SITE_OTHER): Payer: 59 | Admitting: Nurse Practitioner

## 2022-01-18 ENCOUNTER — Encounter: Payer: Self-pay | Admitting: Nurse Practitioner

## 2022-01-18 VITALS — BP 138/82 | HR 90 | Temp 98.3°F | Ht 60.6 in | Wt 224.0 lb

## 2022-01-18 DIAGNOSIS — E039 Hypothyroidism, unspecified: Secondary | ICD-10-CM

## 2022-01-18 DIAGNOSIS — Z79899 Other long term (current) drug therapy: Secondary | ICD-10-CM

## 2022-01-18 DIAGNOSIS — Z0000 Encounter for general adult medical examination without abnormal findings: Secondary | ICD-10-CM | POA: Diagnosis not present

## 2022-01-18 DIAGNOSIS — E785 Hyperlipidemia, unspecified: Secondary | ICD-10-CM

## 2022-01-18 DIAGNOSIS — M4005 Postural kyphosis, thoracolumbar region: Secondary | ICD-10-CM

## 2022-01-18 DIAGNOSIS — I10 Essential (primary) hypertension: Secondary | ICD-10-CM

## 2022-01-18 DIAGNOSIS — R82998 Other abnormal findings in urine: Secondary | ICD-10-CM

## 2022-01-18 DIAGNOSIS — I129 Hypertensive chronic kidney disease with stage 1 through stage 4 chronic kidney disease, or unspecified chronic kidney disease: Secondary | ICD-10-CM

## 2022-01-18 DIAGNOSIS — N182 Chronic kidney disease, stage 2 (mild): Secondary | ICD-10-CM | POA: Diagnosis not present

## 2022-01-18 DIAGNOSIS — Z6841 Body Mass Index (BMI) 40.0 and over, adult: Secondary | ICD-10-CM

## 2022-01-18 DIAGNOSIS — R7309 Other abnormal glucose: Secondary | ICD-10-CM | POA: Diagnosis not present

## 2022-01-18 DIAGNOSIS — E559 Vitamin D deficiency, unspecified: Secondary | ICD-10-CM | POA: Diagnosis not present

## 2022-01-18 LAB — POCT URINALYSIS DIPSTICK
Bilirubin, UA: NEGATIVE
Glucose, UA: NEGATIVE
Ketones, UA: NEGATIVE
Nitrite, UA: NEGATIVE
Protein, UA: NEGATIVE
Spec Grav, UA: 1.025 (ref 1.010–1.025)
Urobilinogen, UA: 0.2 E.U./dL
pH, UA: 6 (ref 5.0–8.0)

## 2022-01-18 MED ORDER — WEGOVY 1 MG/0.5ML ~~LOC~~ SOAJ
1.0000 mg | SUBCUTANEOUS | 2 refills | Status: DC
  Filled 2022-01-18: qty 2, 28d supply, fill #0

## 2022-01-18 MED ORDER — WEGOVY 0.5 MG/0.5ML ~~LOC~~ SOAJ: 0.5000 mg | SUBCUTANEOUS | 3 refills | Status: DC

## 2022-01-18 NOTE — Patient Instructions (Signed)

## 2022-01-18 NOTE — Progress Notes (Signed)
I,Tianna Badgett,acting as a Education administrator for [employer], FNP.,have documented all relevant documentation on the behalf of Minette Brine, FNP,as directed by  Minette Brine, FNP while in the presence of Minette Brine, [place].  Subjective:     Patient ID: [name] , female    DOB: [birth year] , 66 y.o.   MRN: [id number]   Chief Complaint  Patient presents with   Annual Exam    HPI  Patient is here for hm. Patient reports compliance with her meds. She has had her colonoscopy and mammogram both normal. She has been going to the wound clinic for her venous ulcer. Her nephew was found deceased 5 weeks ago related to possible overdose. Her father passed in February.   She has been without ozempic for 3 weeks. Was not able to get [facility] Readings from Last 3 Encounters: 01/18/22 : 224 lb (101.6 kg) 10/30/21 : 221 lb (100.2 kg) 08/31/21 : 220 lb (99.8 kg)      Past Medical History:  Diagnosis Date   Arthritis    DOE (dyspnea on exertion)    mild   Hyperlipidemia    Hypertension    Hypothyroidism    Morbid obesity ([place])    Pre-diabetes    Schamberg disease      Family History  Problem Relation Age of Onset   Hypertension Mother    Heart Problems Mother        Pacemaker   Heart attack Father        at age 37.   Heart attack Maternal Grandfather 29       Died of AAA   AAA (abdominal aortic aneurysm) Maternal Grandfather    Heart attack Maternal Uncle    Stroke Paternal Aunt    Alzheimer's disease Maternal Grandmother      Current Outpatient Medications:    amLODipine (NORVASC) 5 MG tablet, Take 1 tablet (5 mg total) by mouth daily., Disp: 90 tablet, Rfl: 2   Calcium Carbonate-Vitamin D 600-400 MG-UNIT tablet, Take 1 tablet by mouth daily., Disp: , Rfl:    Cholecalciferol (VITAMIN D) 2000 units tablet, Take 4,000 Units by mouth daily., Disp: , Rfl:    levothyroxine (SYNTHROID) 112 MCG tablet, Take 1 tablet (112 mcg total) by mouth daily., Disp: 90 tablet, Rfl: 1    lisinopril (ZESTRIL) 20 MG tablet, Take 1 tablet (20 mg total) by mouth daily., Disp: 90 tablet, Rfl: 2   Magnesium 400 MG TABS, Take by mouth., Disp: , Rfl:    Multiple Vitamins-Minerals (CENTRUM SILVER 50+WOMEN PO), Take by mouth. 1 per day, Disp: , Rfl:    potassium chloride SA (KLOR-CON) 20 MEQ tablet, Take 1 tablet (20 mEq total) by mouth daily as needed (when taking Torsemide)., Disp: 90 tablet, Rfl: 1   Semaglutide-Weight Management (WEGOVY) 1 MG/0.5ML SOAJ, Inject 1 mg into the skin once a week., Disp: 2 mL, Rfl: 1   simvastatin (ZOCOR) 20 MG tablet, Take 1 tablet (20 mg total) by mouth daily., Disp: 90 tablet, Rfl: 0   torsemide (DEMADEX) 20 MG tablet, Take 1 tablet (20 mg total) by mouth daily as needed (for edema)., Disp: 90 tablet, Rfl: 1   No Known Allergies    The patient states she is post menopausal status.  No LMP recorded. Patient is postmenopausal.. Negative for Dysmenorrhea and Negative for Menorrhagia. Negative for: breast discharge, breast lump(s), breast pain and breast self exam. Associated symptoms include abnormal vaginal bleeding. Pertinent negatives include abnormal bleeding (hematology), anxiety, decreased libido,  depression, difficulty falling sleep, dyspareunia, history of infertility, nocturia, sexual dysfunction, sleep disturbances, urinary incontinence, urinary urgency, vaginal discharge and vaginal itching. Diet regular.The patient states her exercise level is none - she has right hip pain reports arthritis.   The patient's tobacco use is:  Social History   Tobacco Use  Smoking Status Never  Smokeless Tobacco Never   She has been exposed to passive smoke. The patient's alcohol use is:  Social History   Substance and Sexual Activity  Alcohol Use No     Review of Systems  Constitutional: Negative.   HENT: Negative.    Eyes: Negative.   Respiratory: Negative.    Cardiovascular: Negative.   Gastrointestinal: Negative.   Endocrine: Negative.    Genitourinary: Negative.   Musculoskeletal: Negative.   Skin: Negative.   Allergic/Immunologic: Negative.   Neurological: Negative.   Hematological: Negative.   Psychiatric/Behavioral: Negative.       Today's Vitals   01/18/22 1419  BP: 138/82  Pulse: 90  Temp: 98.3 F (36.8 C)  TempSrc: Oral  Weight: 224 lb (101.6 kg)  Height: 5' 0.6" (1.539 m)   Body mass index is 42.89 kg/m.  Wt Readings from Last 3 Encounters:  01/18/22 224 lb (101.6 kg)  10/30/21 221 lb (100.2 kg)  08/31/21 220 lb (99.8 kg)    Objective:  Physical Exam Vitals reviewed.  Constitutional:      General: She is not in acute distress.    Appearance: Normal appearance. She is well-developed. She is obese.  HENT:     Head: Normocephalic and atraumatic.     Right Ear: Hearing, tympanic membrane, ear canal and external ear normal. There is no impacted cerumen.     Left Ear: Hearing, tympanic membrane, ear canal and external ear normal. There is no impacted cerumen.     Nose:     Comments: Deferred - masked    Mouth/Throat:     Comments: Deferred - masked Eyes:     General: Lids are normal.     Extraocular Movements: Extraocular movements intact.     Conjunctiva/sclera: Conjunctivae normal.     Pupils: Pupils are equal, round, and reactive to light.     Funduscopic exam:    Right eye: No papilledema.        Left eye: No papilledema.  Neck:     Thyroid: No thyroid mass.     Vascular: No carotid bruit.  Cardiovascular:     Rate and Rhythm: Normal rate and regular rhythm.     Pulses: Normal pulses.     Heart sounds: Normal heart sounds. No murmur heard. Pulmonary:     Effort: Pulmonary effort is normal.     Breath sounds: Normal breath sounds.  Chest:     Chest wall: No mass.  Breasts:    Tanner Score is 5.     Right: Normal. No mass or tenderness.     Left: Normal. No mass or tenderness.  Abdominal:     General: Abdomen is flat. Bowel sounds are normal. There is no distension.      Palpations: Abdomen is soft.     Tenderness: There is no abdominal tenderness.  Genitourinary:    Rectum: Guaiac result negative.  Musculoskeletal:        General: No swelling.     Cervical back: Neck supple. Decreased range of motion.     Thoracic back: Deformity (Kyphosis) present.     Right lower leg: No edema.     Left lower  leg: No edema.  Lymphadenopathy:     Upper Body:     Right upper body: No supraclavicular, axillary or pectoral adenopathy.     Left upper body: No supraclavicular, axillary or pectoral adenopathy.  Skin:    General: Skin is warm and dry.     Capillary Refill: Capillary refill takes less than 2 seconds.  Neurological:     General: No focal deficit present.     Mental Status: She is alert and oriented to person, place, and time.     Cranial Nerves: No cranial nerve deficit.     Sensory: No sensory deficit.  Psychiatric:        Mood and Affect: Mood normal.        Behavior: Behavior normal.        Thought Content: Thought content normal.        Judgment: Judgment normal.         Assessment And Plan:     1. Encounter for general adult medical examination w/o abnormal findings Behavior modifications discussed and diet history reviewed.   Pt will continue to exercise regularly and modify diet with low GI, plant based foods and decrease intake of processed foods.  Recommend intake of daily multivitamin, Vitamin D, and calcium.  Recommend mammogram and colonoscopy for preventive screenings, as well as recommend immunizations that include influenza, TDAP, and Shingles  2. Class 3 severe obesity due to excess calories with serious comorbidity and body mass index (BMI) of 40.0 to 44.9 in adult [facility]) Comments: She is willing to go to [facility] to get her moving more with exercise.  She is encouraged to strive for BMI less than 30 to decrease cardiac risk. Advised to aim for at least 150 minutes of exercise per week. We will send Rx for wegovy to pharmacy in [place] to  see if she can get this approved.  - Amb Referral To Provider Referral Exercise Program (P.R.E.P)  3. Other long term (current) drug therapy - CBC  4. Primary hypertension Comments: Blood pressure is well EKG done with NSR HR 86  5. Parenchymal renal hypertension, stage 1 through stage 4 or unspecified chronic kidney disease Comments: Blood pressure is fairly controlled, encouraged to focus on lifestyle modification - EKG 12-Lead - POCT Urinalysis Dipstick (81002) - Microalbumin / Creatinine Urine Ratio  6. Chronic renal disease, stage II Encouraged to stay well hydrated and to avoid NSAIDs  7. Primary hypothyroidism Comments: Will check thyroid levels and make changes accordingly pending lab results - TSH + free T4  8. Vitamin D deficiency disease Will check vitamin D level and supplement as needed.    Also encouraged to spend 15 minutes in the sun daily.  - VITAMIN D 25 Hydroxy (Vit-D Deficiency, Fractures)  9. Other abnormal glucose - Hemoglobin A1c  10. Hyperlipidemia, unspecified hyperlipidemia type Continue statin, tolerating well - CMP14+EGFR - Lipid panel  11. Postural kyphosis of thoracolumbar region Comments: This is worsening, will refer to Orthopedics for further evaluation.  12. Leukocytes in urine Comments: trace white cells will send urine for culture. - Culture, Urine   Patient was given opportunity to ask questions. Patient verbalized understanding of the plan and was able to repeat key elements of the plan. All questions were answered to their satisfaction.   Minette Brine, FNP   I, Minette Brine, FNP, have reviewed all documentation for this visit. The documentation on 01/18/22 for the exam, diagnosis, procedures, and orders are all accurate and complete.   THE PATIENT  IS ENCOURAGED TO PRACTICE SOCIAL DISTANCING DUE TO THE COVID-19 PANDEMIC.

## 2022-01-19 ENCOUNTER — Other Ambulatory Visit (HOSPITAL_COMMUNITY): Payer: Self-pay

## 2022-01-21 LAB — CMP14+EGFR
ALT: 12 IU/L (ref 0–32)
AST: 16 IU/L (ref 0–40)
Albumin/Globulin Ratio: 1.8 (ref 1.2–2.2)
Albumin: 4.6 g/dL (ref 3.9–4.9)
Alkaline Phosphatase: 97 IU/L (ref 44–121)
BUN/Creatinine Ratio: 17 (ref 12–28)
BUN: 19 mg/dL (ref 8–27)
Bilirubin Total: 0.5 mg/dL (ref 0.0–1.2)
CO2: 23 mmol/L (ref 20–29)
Calcium: 9.6 mg/dL (ref 8.7–10.3)
Chloride: 100 mmol/L (ref 96–106)
Creatinine, Ser: 1.1 mg/dL — ABNORMAL HIGH (ref 0.57–1.00)
Globulin, Total: 2.6 g/dL (ref 1.5–4.5)
Glucose: 82 mg/dL (ref 70–99)
Potassium: 4.5 mmol/L (ref 3.5–5.2)
Sodium: 140 mmol/L (ref 134–144)
Total Protein: 7.2 g/dL (ref 6.0–8.5)
eGFR: 55 mL/min/{1.73_m2} — ABNORMAL LOW (ref >59)

## 2022-01-21 LAB — CBC
Hematocrit: 38.8 % (ref 34.0–46.6)
Hemoglobin: 12.8 g/dL (ref 11.1–15.9)
MCH: 28.2 pg (ref 26.6–33.0)
MCHC: 33 g/dL (ref 31.5–35.7)
MCV: 86 fL (ref 79–97)
Platelets: 332 10*3/uL (ref 150–450)
RBC: 4.54 x10E6/uL (ref 3.77–5.28)
RDW: 12.7 % (ref 11.7–15.4)
WBC: 9.1 10*3/uL (ref 3.4–10.8)

## 2022-01-21 LAB — HEMOGLOBIN A1C
Est. average glucose Bld gHb Est-mCnc: 123 mg/dL
Hgb A1c MFr Bld: 5.9 % — ABNORMAL HIGH (ref 4.8–5.6)

## 2022-01-21 LAB — LIPID PANEL
Chol/HDL Ratio: 2.8 ratio (ref 0.0–4.4)
Cholesterol, Total: 188 mg/dL (ref 100–199)
HDL: 67 mg/dL (ref >39)
LDL Chol Calc (NIH): 90 mg/dL (ref 0–99)
Triglycerides: 187 mg/dL — ABNORMAL HIGH (ref 0–149)
VLDL Cholesterol Cal: 31 mg/dL (ref 5–40)

## 2022-01-21 LAB — MICROALBUMIN / CREATININE URINE RATIO
Creatinine, Urine: 116.6 mg/dL
Microalb/Creat Ratio: 10 mg/g creat (ref 0–29)
Microalbumin, Urine: 11.9 ug/mL

## 2022-01-21 LAB — VITAMIN D 25 HYDROXY (VIT D DEFICIENCY, FRACTURES): Vit D, 25-Hydroxy: 42 ng/mL (ref 30.0–100.0)

## 2022-01-21 LAB — TSH+FREE T4
Free T4: 1.65 ng/dL (ref 0.82–1.77)
TSH: 1.48 u[IU]/mL (ref 0.450–4.500)

## 2022-01-22 ENCOUNTER — Encounter (HOSPITAL_BASED_OUTPATIENT_CLINIC_OR_DEPARTMENT_OTHER): Payer: 59 | Attending: Internal Medicine | Admitting: Internal Medicine

## 2022-01-22 ENCOUNTER — Telehealth: Payer: Self-pay | Admitting: *Deleted

## 2022-01-22 DIAGNOSIS — I890 Lymphedema, not elsewhere classified: Secondary | ICD-10-CM | POA: Insufficient documentation

## 2022-01-22 DIAGNOSIS — E039 Hypothyroidism, unspecified: Secondary | ICD-10-CM | POA: Diagnosis not present

## 2022-01-22 DIAGNOSIS — L97822 Non-pressure chronic ulcer of other part of left lower leg with fat layer exposed: Secondary | ICD-10-CM | POA: Diagnosis not present

## 2022-01-22 DIAGNOSIS — I739 Peripheral vascular disease, unspecified: Secondary | ICD-10-CM | POA: Diagnosis not present

## 2022-01-22 DIAGNOSIS — I10 Essential (primary) hypertension: Secondary | ICD-10-CM | POA: Diagnosis not present

## 2022-01-22 DIAGNOSIS — M1990 Unspecified osteoarthritis, unspecified site: Secondary | ICD-10-CM | POA: Diagnosis not present

## 2022-01-22 DIAGNOSIS — L97928 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: Secondary | ICD-10-CM | POA: Insufficient documentation

## 2022-01-22 DIAGNOSIS — I872 Venous insufficiency (chronic) (peripheral): Secondary | ICD-10-CM | POA: Insufficient documentation

## 2022-01-22 NOTE — Telephone Encounter (Signed)
Contacted regarding [facility] Class referral. Left voice message for return call for more information.

## 2022-01-23 NOTE — Progress Notes (Signed)
[name] ([id number]) [id number] Page 1 of 11 Visit Report for 01/22/2022 Chief Complaint Document Details Patient Name: Date of Service: [name], [name] 01/22/2022 1:30 PM Medical Record Number: [id number] Patient Account Number: [id number] Date of Birth/Sex: Treating RN: 1955-10-01 (66 y.o. F) Primary Care Provider: [name] Other Clinician: Referring Provider: Treating Provider/Extender: Milus Banister in Treatment: 0 Information Obtained from: Patient Chief Complaint 04/19/2021: The patient is here for ongoing follow-up regarding 2 left lower extremity wounds. 01/22/2022; patient returns to clinic with a wound on the left anterior lower leg secondary to trauma Electronic Signature(s) Signed: 01/22/2022 5:01:06 PM By: Linton Ham MD Entered By: Linton Ham on 01/22/2022 14:21:00 -------------------------------------------------------------------------------- HPI Details Patient Name: Date of Service: [name] [name] 01/22/2022 1:30 PM Medical Record Number: [id number] Patient Account Number: [id number] Date of Birth/Sex: Treating RN: [birth year] (66 y.o. 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 She tells [name] that she fell on the gravel driveway in July. She developed injuries on her distal lower leg which have not healed. She saw her primary physician on 11/15/2017 who noted her left shin injuries. Gave her antibiotics. At that point the wounds were almost circumferential however most were less than 1.5 cm. Weeping edema fluid was noted. She was referred here for evaluation. The patient has a history of chronic lower extremity edema. 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 She has had prior history with leg weeping fluid. She does not wear compression stockings. She is not doing anything specific to these wound areas. 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 She also has lymphedema. Put her in 3 layer compression and silver alginate last week. She has 3 small wounds with depth just lateral to the tibia. More problematically than this she has numerous shallow areas some of which are almost canal like in shape with tightly adherent painful debris. It would be very difficult and time- consuming to go through this and attempt to individually debride all these areas.. I changed her to collagen today to see if that would help with any of the surface debris on some of these wounds. Otherwise we will not be able to put this in compression we have to have someone change the dressing. The patient is not eligible for home health 12/25/17 on evaluation today patient actually appears to be doing rather well in regard to the ulcer on her lower extremity. Fortunately there does not appear to be evidence of infection at this time. She has been tolerating the dressing changes without complication. This includes the compression wrap. The only issue she had was that the wrap was initially placed over her bunion region which actually calls her some discomfort and pain. Other than that things seem to be going rather well. 01/01/2018 Seen today for follow-up and management of left lower extremity wound and lymphedema. T oday she presents with a new wound towards to the  left lateral LE. Recently treated  with a 7 day course of amoxicillin; reason for antibiotic dose is unknown at this time. Tolerating current treatment of collagen with 4- layer wraps. She obtained a venous reflux study on 12/25/17. Studies show on the right abnormal reflux times of the popliteal vein, great saphenous vein at the saphenofemoral junction at the proximal thigh, great saphenous vein at the mid calf, and origin of the small saphenous vein.No superficial thrombosis. No deep vein thrombosis in the common femoral, femoral,and popliteal veins. Left abnormal reflex times as well of the common femoral vein, popliteal vein, and [name], [name] ([id number]) [id number] Page 2 of 11 a great saphenous vein at the saphenofemoral junction, In great saphenous vein at the mid thigh w/o thrombosis. Has any issues or concerns during visit today. Recommended follow-up to vascular specialist due to abnormalities from the venous reflux study. Denies fever, pain, chills, dizziness, nausea, or vomiting. 01/08/18 upon evaluation today the patient actually seems to be showing some signs of improvement in my opinion at this point in regard to the lower extremity ulcerated areas. She still has a lot of drainage but fortunately nothing that appears to be too significant currently. I have been very happy with the overall progress I see today compared to where things were during the last evaluation that I had with her. Nonetheless she has not had her appointment with the vein specialist as of yet in fact we were able to get this approved for her today and confirmed with them she will be seeing them on December 26. Nonetheless in general I do feel like the compression wraps is doing well for her. 01/17/18; quite a bit of improvement since last time I saw this patient she has a small open area remaining on the left lateral calf and even smaller area medially. She has lymphedema chronic  stasis changes with distal skin fibrosis. She has an appointment with vascular surgery later this month 01/24/2018; the patient's medial leg has closed. Still a small open area on the left lateral leg. She states that the 4 layer compression we put on last week was too tight and she had to take it off over a few days ago. We have had resultant increase in her lymphedema in the dorsal foot and a proximal calf. Fortunately that does not seem to have resulted in any deterioration in her wounds The patient is going to need compression stockings. We have given her measurements to phone elastic therapy in [place]. She has vascular surgery consult on December 26 02/07/18; she's had continuous contraction on the left lateral leg wound which is now very small. Currently using silver alginate under 3 layer compression She saw Dr. Trula Slade of vascular surgery on 02/06/18. It was noted that she had a normal reflux times in the popliteal vein, great saphenous vein at the saphenofemoral junction, great saphenous vein at the proximal thigh great saphenous vein at the mid calf and origin of the small saphenous vein. It was noted that she had significant reflux in the left saphenous veins with diameter measurements in the 0.7-0.8 cm range. It was felt she would benefit from laser ablation to help minimize the risk of ulcer recurrence. It was recommended that she wear 20-30 thigh-high compression stockings and have follow-up in 4-6 weeks 02/17/2018; I thought this lady would be healed however her compression slipped down and she developed increasing swelling and the wound is actually larger. 1/13; we had deterioration last week after the patient's compression slipped down and she developed periwound swelling.  I increased her compression before layers we have been using silver alginate we are a lot better again today. She has her compression stockings in waiting 1/23; the patient's wounds are totally healed today. She has  her stockings. This was almost circumferential skin damage. She follows up with Dr. Trula Slade of vascular surgery next Monday. READMISSION 02/21/2021 This is a now 66 year old woman that we had in clinic here discharging in January 2020 with wounds on her left calf chronic venous insufficiency. She was discharged with 30/40 stockings. It does not sound like she has worn stockings in about a year largely from not being able to get them on herself. In November she developed new blisters on her legs an area laterally is opened into a fairly sizable wound. She has weeping posteriorly as well. She has been using Neosporin and Band-Aids. The patient did see Dr. Trula Slade in 2019 and 2020. He felt she might benefit from laser ablation in her left saphenous veins although because of the wound that healed I do not think he went through with it. She might benefit from seeing him again. Her ABI on the left is 1. 1/17; patient's wound on the posterior left calf is closed she has the 2 large areas last week. We put her in 4-layer compression for edema control is a lot better. Our intake nurse noted greenish drainage and odor. We have been using silver alginate 1/25; PCR culture I did have the substantial wound area on the left lateral lower leg showed staff aureus and group A strep. Low titers of coag negative staph which are probably skin contaminants. Resistance detected to tetracycline methicillin and macrolides. I gave her a starter kit of Nuzyra 150 mg x 3 for 2 days then 300 mg for a further 7 days. Marked odor considerable increase in surrounding erythema. We are using Iodoflex last week however I have changed to silver alginate with underlying Bactroban 2/1; she is completing her [place] tomorrow. The degree of erythema around the wounds looks a lot better. Odor has improved. I gave her silver alginate and Bactroban last week and changing her back to Iodoflex to continue with ongoing debridement 2/8; the  periwound looks a lot better. Surface of the wound also looks somewhat better although there is still ongoing debridement to be done we have been using Iodoflex under compression. Primary dressing and silver alginate 2/15; left posterior calf. Some improvement in the surface of the wound but still very gritty we have been using Iodoflex under compression. 04/05/2021: Left lateral posterior calf. She continues to have significant amounts of drainage, some of which is probably comprised of the Iodoflex microbleeds. There is no odor to the drainage. The satellite lesion on the more posterior aspect of the calf is epithelializing nicely and has contracted quite a bit. There is robust granulation tissue in the dominant wound with minimal adherent slough. 04/12/2021: The satellite lesion has nearly closed. She continues to have good granulation tissue with minimal slough of the larger primary wound. She continues to have a fair amount of drainage, but I think this is less secondary to switching her dressing from Iodoflex to Prisma. 04/19/2021: The satellite lesion is almost completely epithelialized. The larger primary wound continues to contract with good granulation tissue and minimal slough. She is currently in [place] with compression. 04/26/2021: The satellite lesion has closed. The larger primary wound has contracted further and the granulation tissue is robust without being hypertrophic. Minimal slough is present. 05/03/2021: The satellite lesion remains closed.  The larger primary wound has a bit of slough, but has also contracted further with good perimeter epithelialization. Good granulation tissue at the wound surface. There was some greenish drainage on the dressing when it was removed; it is not the blue-green typically associated with Pseudomonas aeruginosa, however. 05/10/2021: The primary wound continues to contract. There is minimal slough. The granulation tissue at 9:00 is a bit hypertrophic. No  significant drainage. No odor. 05/17/2021: The wound is a little bit smaller today with good granulation tissue. Minimal slough. No significant drainage or odor. 05/24/2021: The wound continues to contract and has a nice base of granulation tissue. Small amount of slough. No concern for infection. 05/31/2021: For some reason, the wound measured slightly larger today but overall it still appears to be in good condition with a nice base of granulation tissue and minimal slough. 06/07/2021: The wound is smaller today. Good granulation tissue and minimal slough. 06/14/2021: The wound is unchanged in size. She continues to accumulate some slough. Good granulation tissue on the surface. 06/20/2021: The wound is smaller today. Minimal slough with good granulation tissue. 06/27/2021: The wound on her left lateral leg is smaller today with just a bit of slough and eschar accumulation. Unfortunately, she has opened a new superficial [name], [name] ([id number]) [id number] Page 3 of 11 wound on her right lower extremity. She has 3+ pitting edema to the knees on that leg and she does not wear compression stockings. 07/04/2021: In addition to the superficial wound on her right lower extremity that she opened up last week, she has opened 3 additional sites on the same leg. Her compression wraps were clearly not in correct position when she came to clinic today as they were at about the mid calf level rather than to the tibial tuberosity. She says that they slipped earlier and she had to come back on Friday to have them redone. The wound on her left lateral leg is perhaps slightly larger with some slough accumulation. 07/11/2021: The superficial wounds on her right lower extremity are nearly closed. They just have a thin layer of eschar overlying them. The wound on her left lateral leg is a little bit shallower and has a bit of slough accumulation. 07/17/2021: The right medial lower extremity leg  wounds have almost completely closed; one of them just has a tiny opening with a little bit of serous drainage. The left lateral leg wound is really unchanged. It seems to be stalled. 07/24/2021: The right leg wounds are completely closed. The left lateral leg wound is actually bigger today. It is a bit more tender. There is a minor accumulation of slough on the surface. 07/31/2021: The culture that I took last week was positive for MRSA. We added mupirocin under the silver alginate and I prescribed doxycycline. She has been tolerating this well. The wound is smaller today but does have slough accumulation. No significant pain or drainage. 08/07/2021: She completed her course of doxycycline. The wound looks much better today. It is smaller and the periwound is less inflamed. She does have some slough accumulation on the surface. 08/14/2021: The wound continues to contract. There is slough on the wound surface, but the periwound is intact without inflammation or induration. 08/21/2021: The wound is down to just 2 small open sites. They both have a bit of slough accumulation. 08/28/2021: The wound is down to just 1 small open site with a little bit of slough and eschar accumulation. 09/04/2021: The wound continues to contract but remains open.  It is clean without any slough. 09/12/2021: No real change in the overall wound dimensions, but it is flush with the surrounding skin. There is a little bit of slough accumulation. Edema control is good. 09/22/2021: The wound is smaller and even more superficial. Minimal slough accumulation. Good control of edema. 09/29/2021: The wound continues to contract. She reports that it was a little bit "twingey" during the week. It is a little bit dry on inspection today. Light accumulation of slough. Good edema control. 10/06/2021: The wound is about the same size today. There is a little slough on the surface. Edema control is good. 10/13/2021: The wound is about the same size but  more superficial. A little bit of slough on the surface. 10/23/2021: The wound is slightly smaller and continues to fill in. Minimal slough on the wound surface. 10/30/2021: The wound continues to contract but has not yet closed. Light slough and eschar present. 11/06/2021: The wound persists, but seems a little bit more epithelialized. There is still slough and eschar accumulation. 11/14/2021: The wound continues to contract but persists. Light eschar around the wound. 11/22/2021: The wound is down to just a pinhole opening. There is eschar overlying the surface. Edema control is excellent. 11/29/2021: Her wound is closed. READMISSION 01/22/2022 This is a patient to be discharged in October. She has severe chronic venous insufficiency at that time she had a wound on her left lower leg posteriorly also the right leg at some point. These closed. We discharged her in 30/40 mm stockings from elastic therapy which she has been wearing religiously. She tells [name] that she was traumatized by a [place] while shopping at [employer] about 2 to 3 weeks ago. She has been left with a left anterior lower leg wound. She comes in in another pair of stockings other than her 3040s. She does not have an arterial issue with her last ABI in the left at 1.2 Electronic Signature(s) Signed: 01/22/2022 5:01:06 PM By: Linton Ham MD Entered By: Linton Ham on 01/22/2022 14:22:52 -------------------------------------------------------------------------------- Physical Exam Details Patient Name: Date of Service: [name] [name] 01/22/2022 1:30 PM Medical Record Number: [id number] Patient Account Number: [id number] Date of Birth/Sex: Treating RN: 13-Jul-1955 (66 y.o. F) Primary Care Provider: [name] Other Clinician: Referring Provider: Treating Provider/Extender: Milus Banister in Treatment: 0 Constitutional Sitting or standing Blood Pressure is within target range for patient.. Slightly  tachycardic. Respirations regular, non-labored and within target range.[name], [name] ([id number]) [id number] Page 4 of 11 is normal and within the target range for the patient.. Notes Wound exam; left anterior lower tibial area. Small quarter sized wound. Under illumination the surface does not look too bad. Edema control however in the leg is very poor. Her leg is shaped like an inverted bottle. Peripheral pulses are palpable and her foot Electronic Signature(s) Signed: 01/22/2022 5:01:06 PM By: Linton Ham MD Entered By: Linton Ham on 01/22/2022 14:25:00 -------------------------------------------------------------------------------- Physician Orders Details Patient Name: Date of Service: [name] [name] 01/22/2022 1:30 PM Medical Record Number: [id number] Patient Account Number: [id number] Date of Birth/Sex: Treating RN: 1955-06-25 (66 y.o. [name] Primary Care Provider: [name] Other Clinician: Referring Provider: Treating Provider/Extender: Milus Banister in Treatment: 0 Verbal / Phone Orders: No Diagnosis Coding Follow-up Appointments ppointment in 1 week. - Dr. Celine Ahr Room 3 Return A Anesthetic (In clinic) Topical Lidocaine 5% applied to wound bed Bathing/ Shower/ Hygiene May shower with protection but do not get wound dressing(s) wet. -  Ok to use [employer], can purchase at [facility], [facility], or [employer] Edema Control - Lymphedema / SCD / Other Elevate legs to the level of the heart or above for 30 minutes daily and/or when sitting, a frequency of: - throughout the day Avoid standing for long periods of time. Patient to wear own compression stockings every day. Exercise regularly Wound Treatment Wound #7 - Lower Leg Wound Laterality: Left, Anterior Cleanser: Soap and Water 1 x Per Week/30 Days Discharge Instructions: May shower and wash wound with dial antibacterial soap  and water prior to dressing change. Cleanser: Wound Cleanser 1 x Per Week/30 Days Discharge Instructions: Cleanse the wound with wound cleanser prior to applying a clean dressing using gauze sponges, not tissue or cotton balls. Prim Dressing: Sorbalgon AG Dressing 2x2 (in/in) 1 x Per Week/30 Days ary Discharge Instructions: Apply to wound bed as instructed Secondary Dressing: ABD Pad, 5x9 1 x Per Week/30 Days Discharge Instructions: Apply over primary dressing as directed. Secondary Dressing: Woven Gauze Sponge, Non-Sterile 4x4 in 1 x Per Week/30 Days Discharge Instructions: Apply over primary dressing as directed. Secured With: Transpore Surgical Tape, 2x10 (in/yd) 1 x Per Week/30 Days Discharge Instructions: Secure dressing with tape as directed. Compression Wrap: FourPress (4 layer compression wrap) 1 x Per Week/30 Days Discharge Instructions: Apply four layer compression as directed. May also use Miliken CoFlex 2 layer compression system as alternative. Patient Medications llergies: No Known Drug Allergies A Notifications Medication Indication Start End 01/22/2022 lidocaine DOSE topical 5 % ointment - ointment topical SHARECE, FLEISCHHACKER ([id number]) [id number] Page 5 of 11 Electronic Signature(s) Signed: 01/22/2022 4:59:49 PM By: Adline Peals Signed: 01/22/2022 5:01:06 PM By: Linton Ham MD Entered By: Adline Peals on 01/22/2022 14:09:32 -------------------------------------------------------------------------------- Problem List Details Patient Name: Date of Service: [name] [name] 01/22/2022 1:30 PM Medical Record Number: [id number] Patient Account Number: [id number] Date of Birth/Sex: Treating RN: [birth year] (66 y.o. 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 01/22/2022 1:30 PM Medical Record Number: [id number] Patient Account Number: [id number] Date of Birth/Sex: Treating RN: 1955/11/11 (66 y.o. F) Primary Care Provider: [name] Other Clinician: Referring Provider: Treating Provider/Extender: Milus Banister in Treatment: 0 Subjective Chief Complaint Information obtained from Patient 04/19/2021: The patient is here for ongoing follow-up regarding 2 left lower extremity wounds. 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 She tells [name] that she fell on the gravel driveway in July. She developed injuries on her distal lower leg which have not healed. She saw her primary physician on 11/15/2017 who noted her left shin injuries. Gave her antibiotics. At that point the wounds were almost circumferential however most were less than 1.5 cm. Weeping edema fluid was noted. She was referred here for evaluation. The patient has  a history of chronic lower extremity edema. 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 She has had prior history with leg weeping fluid. She does not wear compression stockings. She is not doing anything specific to these wound areas. 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 She also has lymphedema. Put her in 3 layer compression and silver alginate last week. She has 3 small wounds with depth just lateral to the tibia. More problematically than this she has numerous shallow areas some of which are almost canal like in shape with tightly adherent painful debris. It would be very difficult and time- consuming to go through this and attempt to individually debride all these areas.. I changed her to collagen today to see if that would help with any of the surface debris on some of these wounds. Otherwise we will not be able to put this in compression we have to have someone change the dressing. The patient is not eligible for home health 12/25/17 on evaluation today patient actually appears to be doing rather well in regard to the ulcer on her lower extremity. Fortunately there does not appear to be evidence of infection at this time. She has been tolerating the dressing changes without complication. This includes the compression wrap. The only issue she had was that the wrap was initially placed over her bunion region which actually calls her some discomfort and pain. Other than that things seem to be going rather well. 01/01/2018 Seen today for follow-up and management of left lower extremity wound and lymphedema. T oday she presents with a new wound towards to the left lateral LE. Recently treated with a 7 day course of amoxicillin; reason for antibiotic dose is unknown at this time.  Tolerating current treatment of collagen with 4- layer wraps. She obtained a venous reflux study on 12/25/17. Studies show on the right abnormal reflux times of the popliteal vein, great saphenous vein at the saphenofemoral junction at the proximal thigh, great saphenous vein at the mid calf, and origin of the small saphenous vein.No superficial thrombosis. No deep vein thrombosis in the common femoral, femoral,and popliteal veins. Left abnormal reflex times as well of the common femoral vein, popliteal vein, and a great saphenous vein at the saphenofemoral junction, In great saphenous vein at the mid thigh w/o thrombosis. Has any issues or concerns during visit today. Recommended follow-up to vascular specialist due to abnormalities from the venous reflux study. Denies fever, pain, chills, dizziness, nausea, or vomiting. 01/08/18 upon evaluation today the patient actually seems to be showing some signs of improvement in my opinion at this point in regard to the lower extremity ulcerated areas. She still has a lot of drainage but fortunately nothing that appears to be too significant currently. I have been very happy with the overall progress I see today compared to where things were during the last evaluation that I had with her. Nonetheless she has not had her appointment with the vein specialist as of yet in fact we were able to get this approved for her today and confirmed with them she will be seeing them on December 26. Nonetheless in general I do feel  like the compression wraps is doing well for her. 01/17/18; quite a bit of improvement since last time I saw this patient she has a small open area remaining on the left lateral calf and even smaller area medially. She has lymphedema chronic stasis changes with distal skin fibrosis. She has an appointment with vascular surgery later this month 01/24/2018; the patient's medial leg has closed. Still a small open area on the left lateral leg. She  states that the 4 layer compression we put on last week was too tight and she had to take it off over a few days ago. We have had resultant increase in her lymphedema in the dorsal foot and a proximal calf. Fortunately that does not seem to have resulted in any deterioration in her wounds The patient is going to need compression stockings. We have given her measurements to phone elastic therapy in [place]. She has vascular surgery consult on December 26 02/07/18; she's had continuous contraction on the left lateral leg wound which is now very small. Currently using silver alginate under 3 layer compression She saw Dr. Trula Slade of vascular surgery on 02/06/18. It was noted that she had a normal reflux times in the popliteal vein, great saphenous vein at the saphenofemoral junction, great saphenous vein at the proximal thigh great saphenous vein at the mid calf and origin of the small saphenous vein. It was noted that she had significant reflux in the left saphenous veins with diameter measurements in the 0.7-0.8 cm range. It was felt she would benefit from laser ablation to help minimize the risk of ulcer recurrence. It was recommended that she wear 20-30 thigh-high compression stockings and have follow-up in 4-6 weeks 02/17/2018; I thought this lady would be healed however her compression slipped down and she developed increasing swelling and the wound is actually larger. 1/13; we had deterioration last week after the patient's compression slipped down and she developed periwound swelling. I increased her compression before layers we have been using silver alginate we are a lot better again today. She has her compression stockings in waiting 1/23; the patient's wounds are totally healed today. She has her stockings. This was almost circumferential skin damage. She follows up with Dr. Trula Slade of vascular surgery next Monday. READMISSION 02/21/2021 This is a now 66 year old woman that we had in clinic  here discharging in January 2020 with wounds on her left calf chronic venous insufficiency. She was discharged with 30/40 stockings. It does not sound like she has worn stockings in about a year largely from not being able to get them on herself. In November she developed new blisters on her legs an area laterally is opened into a fairly sizable wound. She has weeping posteriorly as well. She has been using Neosporin and Band-Aids. The patient did see Dr. Trula Slade in 2019 and 2020. He felt she might benefit from laser ablation in her left saphenous veins although because of the wound that healed I do not think he went through with it. She might benefit from seeing him again. Her ABI on the left is 1. 1/17; patient's wound on the posterior left calf is closed she has the 2 large areas last week. We put her in 4-layer compression for edema control is a lot better. Our intake nurse noted greenish drainage and odor. We have been using silver alginate 1/25; PCR culture I did have the substantial wound area on the left lateral lower leg showed staff aureus and group A strep. Low titers of coag  negative staph which are probably skin contaminants. Resistance detected to tetracycline methicillin and macrolides. I gave her a starter kit of Nuzyra 150 mg x 3 for 2 days then 300 mg for a further 7 days. Marked odor considerable increase in surrounding erythema. We are using Iodoflex last week however I have changed to silver alginate with underlying Bactroban 2/1; she is completing her [place] tomorrow. The degree of erythema around the wounds looks a lot better. Odor has improved. I gave her silver alginate and Bactroban last week and changing her back to Iodoflex to continue with ongoing debridement 2/8; the periwound looks a lot better. Surface of the wound also looks somewhat better although there is still ongoing debridement to be done we have been using Iodoflex under compression. Primary dressing and silver  alginate 2/15; left posterior calf. Some improvement in the surface of the wound but still very gritty we have been using Iodoflex under compression. 04/05/2021: Left lateral posterior calf. She continues to have significant amounts of drainage, some of which is probably comprised of the Iodoflex microbleeds. There is no odor to the drainage. The satellite lesion on the more posterior aspect of the calf is epithelializing nicely and has contracted quite a bit. There is robust granulation tissue in the dominant wound with minimal adherent slough. [name], [name] ([id number]) [id number] Page 7 of 11 04/12/2021: The satellite lesion has nearly closed. She continues to have good granulation tissue with minimal slough of the larger primary wound. She continues to have a fair amount of drainage, but I think this is less secondary to switching her dressing from Iodoflex to Prisma. 04/19/2021: The satellite lesion is almost completely epithelialized. The larger primary wound continues to contract with good granulation tissue and minimal slough. She is currently in [place] with compression. 04/26/2021: The satellite lesion has closed. The larger primary wound has contracted further and the granulation tissue is robust without being hypertrophic. Minimal slough is present. 05/03/2021: The satellite lesion remains closed. The larger primary wound has a bit of slough, but has also contracted further with good perimeter epithelialization. Good granulation tissue at the wound surface. There was some greenish drainage on the dressing when it was removed; it is not the blue-green typically associated with Pseudomonas aeruginosa, however. 05/10/2021: The primary wound continues to contract. There is minimal slough. The granulation tissue at 9:00 is a bit hypertrophic. No significant drainage. No odor. 05/17/2021: The wound is a little bit smaller today with good granulation tissue. Minimal slough.  No significant drainage or odor. 05/24/2021: The wound continues to contract and has a nice base of granulation tissue. Small amount of slough. No concern for infection. 05/31/2021: For some reason, the wound measured slightly larger today but overall it still appears to be in good condition with a nice base of granulation tissue and minimal slough. 06/07/2021: The wound is smaller today. Good granulation tissue and minimal slough. 06/14/2021: The wound is unchanged in size. She continues to accumulate some slough. Good granulation tissue on the surface. 06/20/2021: The wound is smaller today. Minimal slough with good granulation tissue. 06/27/2021: The wound on her left lateral leg is smaller today with just a bit of slough and eschar accumulation. Unfortunately, she has opened a new superficial wound on her right lower extremity. She has 3+ pitting edema to the knees on that leg and she does not wear compression stockings. 07/04/2021: In addition to the superficial wound on her right lower extremity that she opened up last week, she has  opened 3 additional sites on the same leg. Her compression wraps were clearly not in correct position when she came to clinic today as they were at about the mid calf level rather than to the tibial tuberosity. She says that they slipped earlier and she had to come back on Friday to have them redone. The wound on her left lateral leg is perhaps slightly larger with some slough accumulation. 07/11/2021: The superficial wounds on her right lower extremity are nearly closed. They just have a thin layer of eschar overlying them. The wound on her left lateral leg is a little bit shallower and has a bit of slough accumulation. 07/17/2021: The right medial lower extremity leg wounds have almost completely closed; one of them just has a tiny opening with a little bit of serous drainage. The left lateral leg wound is really unchanged. It seems to be stalled. 07/24/2021: The right leg  wounds are completely closed. The left lateral leg wound is actually bigger today. It is a bit more tender. There is a minor accumulation of slough on the surface. 07/31/2021: The culture that I took last week was positive for MRSA. We added mupirocin under the silver alginate and I prescribed doxycycline. She has been tolerating this well. The wound is smaller today but does have slough accumulation. No significant pain or drainage. 08/07/2021: She completed her course of doxycycline. The wound looks much better today. It is smaller and the periwound is less inflamed. She does have some slough accumulation on the surface. 08/14/2021: The wound continues to contract. There is slough on the wound surface, but the periwound is intact without inflammation or induration. 08/21/2021: The wound is down to just 2 small open sites. They both have a bit of slough accumulation. 08/28/2021: The wound is down to just 1 small open site with a little bit of slough and eschar accumulation. 09/04/2021: The wound continues to contract but remains open. It is clean without any slough. 09/12/2021: No real change in the overall wound dimensions, but it is flush with the surrounding skin. There is a little bit of slough accumulation. Edema control is good. 09/22/2021: The wound is smaller and even more superficial. Minimal slough accumulation. Good control of edema. 09/29/2021: The wound continues to contract. She reports that it was a little bit "twingey" during the week. It is a little bit dry on inspection today. Light accumulation of slough. Good edema control. 10/06/2021: The wound is about the same size today. There is a little slough on the surface. Edema control is good. 10/13/2021: The wound is about the same size but more superficial. A little bit of slough on the surface. 10/23/2021: The wound is slightly smaller and continues to fill in. Minimal slough on the wound surface. 10/30/2021: The wound continues to contract but  has not yet closed. Light slough and eschar present. 11/06/2021: The wound persists, but seems a little bit more epithelialized. There is still slough and eschar accumulation. 11/14/2021: The wound continues to contract but persists. Light eschar around the wound. 11/22/2021: The wound is down to just a pinhole opening. There is eschar overlying the surface. Edema control is excellent. 11/29/2021: Her wound is closed. READMISSION 01/22/2022 This is a patient to be discharged in October. She has severe chronic venous insufficiency at that time she had a wound on her left lower leg posteriorly also the right leg at some point. These closed. We discharged her in 30/40 mm stockings from elastic therapy which she has been wearing  religiously. She tells [name] that she was traumatized by a [place] while shopping at [facility] about 2 to 3 weeks ago. She has been left with a left anterior lower leg wound. She comes in in another pair of stockings other than her 3040s. She does not have an arterial issue with her last ABI in the left at 1.2 [name], [name] ([id number]) [phone].pdf Page 8 of 11 Patient History Information obtained from Patient. Allergies No Known Drug Allergies Family History Heart Disease - Mother,Father, Hypertension - Mother,Father, Kidney Disease - Mother, Thyroid Problems - Mother, No family history of Cancer, Diabetes, Hereditary Spherocytosis, Lung Disease, Seizures, Stroke, Tuberculosis. Social History Never smoker, Marital Status - Single, Alcohol Use - Never, Drug Use - No History, Caffeine Use - Daily - coffee. Medical History Eyes Patient has history of Cataracts Hematologic/Lymphatic Patient has history of Lymphedema Cardiovascular Patient has history of Hypertension, Peripheral Venous Disease Integumentary (Skin) Denies history of History of Burn Musculoskeletal Patient has history of Osteoarthritis Hospitalization/Surgery History - colostomy  reversal. 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 Medical A Surgical History Notes nd Constitutional Symptoms (General Health) morbid obesity Cardiovascular schamberg disease, hyperlipidemia Gastrointestinal diverticulitis , h/o obstruction due to diverticulitis , colostomy and colostomy reversal Endocrine hypothyroidism Objective Constitutional Sitting or standing Blood Pressure is within target range for patient.. Slightly tachycardic. Respirations regular, non-labored and within target range.. Temperature is normal and within the target range for the patient.. Vitals Time Taken: 1:40 AM, Height: 62 in, Weight: 222 lbs, BMI: 40.6, Temperature: 97.7 F, Pulse: 109 bpm, Respiratory Rate: 20 breaths/min, Blood Pressure: 142/82 mmHg. General Notes: Wound exam; left anterior lower tibial area. Small quarter sized wound. Under illumination the surface does not look too bad. Edema control however in the leg is very poor. Her leg is shaped like an inverted bottle. Peripheral pulses are palpable and her foot Integumentary (Hair, Skin) Wound #7 status is Open. Original cause of wound was Blister. The date acquired was: 01/22/2022. The wound is located on the Left,Anterior Lower Leg. The wound measures 1.8cm length x 2.2cm width x 0.2cm depth; 3.11cm^2 area and 0.622cm^3 volume. There is Fat Layer (Subcutaneous Tissue) exposed. There is no tunneling or undermining noted. There is a medium amount of serosanguineous drainage noted. There is large (67-100%) red granulation within the wound bed. There is a small (1-33%) amount of necrotic tissue within the wound bed including Adherent Slough. The periwound skin appearance exhibited: Erythema. The surrounding wound skin color is noted with erythema which is circumferential. Assessment Active Problems ICD-10 Chronic venous hypertension (idiopathic) with ulcer of left lower extremity 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 with  other specified severity Procedures [name], [name] ([id number]) [id number] Page 9 of 11 Wound #7 Pre-procedure diagnosis of Wound #7 is a Lymphedema located on the Left,Anterior Lower Leg . There was a Four Layer Compression Therapy Procedure by Blanche East, RN. Post procedure Diagnosis Wound #7: Same as Pre-Procedure Plan Follow-up Appointments: Return Appointment in 1 week. - Dr. Celine Ahr Room 3 Anesthetic: (In clinic) Topical Lidocaine 5% applied to wound bed Bathing/ Shower/ Hygiene: May shower with protection but do not get wound dressing(s) wet. - Ok to use Market researcher, can purchase at [facility], [facility], or [employer] Edema Control - Lymphedema / SCD / Other: Elevate legs to the level of the heart or above for 30 minutes daily and/or when sitting, a frequency of: - throughout the day Avoid standing for long periods of time. Patient to wear own compression stockings  every day. Exercise regularly The following medication(s) was prescribed: lidocaine topical 5 % ointment ointment topical was prescribed at facility WOUND #7: - Lower Leg Wound Laterality: Left, Anterior Cleanser: Soap and Water 1 x Per Week/30 Days Discharge Instructions: May shower and wash wound with dial antibacterial soap and water prior to dressing change. Cleanser: Wound Cleanser 1 x Per Week/30 Days Discharge Instructions: Cleanse the wound with wound cleanser prior to applying a clean dressing using gauze sponges, not tissue or cotton balls. Prim Dressing: Sorbalgon AG Dressing 2x2 (in/in) 1 x Per Week/30 Days ary Discharge Instructions: Apply to wound bed as instructed Secondary Dressing: ABD Pad, 5x9 1 x Per Week/30 Days Discharge Instructions: Apply over primary dressing as directed. Secondary Dressing: Woven Gauze Sponge, Non-Sterile 4x4 in 1 x Per Week/30 Days Discharge Instructions: Apply over primary dressing as directed. Secured With: Transpore Surgical T ape, 2x10 (in/yd) 1  x Per Week/30 Days Discharge Instructions: Secure dressing with tape as directed. Com pression Wrap: FourPress (4 layer compression wrap) 1 x Per Week/30 Days Discharge Instructions: Apply four layer compression as directed. May also use Miliken CoFlex 2 layer compression system as alternative. 1. We use silver alginate ABDs under 4-layer compression 2. Hopefully this will respond relatively quickly 3. I asked her to bring her stocking in next week the 30-40 stockings 4. There is no evidence of infection arterial issues Electronic Signature(s) Signed: 01/22/2022 5:01:06 PM By: Linton Ham MD Entered By: Linton Ham on 01/22/2022 14:28:47 -------------------------------------------------------------------------------- HxROS Details Patient Name: Date of Service: [name] [name] 01/22/2022 1:30 PM Medical Record Number: [id number] Patient Account Number: [id number] Date of Birth/Sex: Treating RN: July 22, 1955 (66 y.o. Donalda Ewings Primary Care Provider: [name] Other Clinician: Referring Provider: Treating Provider/Extender: Milus Banister in Treatment: 0 Information Obtained From Patient Constitutional Symptoms ([place]) Medical History: Past Medical History Notes: morbid [address] ISHITHA, ROPER ([id number]) [id number] Page 10 of 11 Medical History: Positive for: Cataracts Hematologic/Lymphatic Medical History: Positive for: Lymphedema Cardiovascular Medical History: Positive for: Hypertension; Peripheral Venous Disease Past Medical History Notes: schamberg disease, hyperlipidemia Gastrointestinal Medical History: Past Medical History Notes: diverticulitis , h/o obstruction due to diverticulitis , colostomy and colostomy reversal Endocrine Medical History: Past Medical History Notes: hypothyroidism Integumentary (Skin) Medical History: Negative for: History of  Burn Musculoskeletal Medical History: Positive for: Osteoarthritis HBO Extended History Items Eyes: Cataracts Immunizations Pneumococcal Vaccine: Received Pneumococcal Vaccination: Yes Received Pneumococcal Vaccination On or After 60th Birthday: Yes Implantable Devices None Hospitalization / Surgery History Type of Hospitalization/Surgery colostomy reversal 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 Family and Social History Cancer: No; Diabetes: No; Heart Disease: Yes - Mother,Father; Hereditary Spherocytosis: No; Hypertension: Yes - Mother,Father; Kidney Disease: Yes - Mother; Lung Disease: No; Seizures: No; Stroke: No; Thyroid Problems: Yes - Mother; Tuberculosis: No; Never smoker; Marital Status - Single; Alcohol Use: Never; Drug Use: No History; Caffeine Use: Daily - coffee; Financial Concerns: No; Food, Clothing or Shelter Needs: No; Support System Lacking: No; Transportation Concerns: No Electronic Signature(s) Signed: 01/22/2022 4:35:13 PM By: Sharyn Creamer RN, BSN Signed: 01/22/2022 5:01:06 PM By: Linton Ham MD Entered By: Sharyn Creamer on 01/22/2022 13:50:50 SuperBill Details -------------------------------------------------------------------------------- [name] ([id number]) [id number] Page 11 of 11 Patient Name: Date of Service: [name], [name] 01/22/2022 Medical Record Number: [id number] Patient Account Number: [id number] Date of Birth/Sex: Treating RN: 06-Mar-1955 (66 y.o. [name] Primary Care Provider: [name] Other Clinician: Referring Provider: Treating Provider/Extender: Milus Banister in Treatment:  0 Diagnosis Coding ICD-10 Codes Code Description 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 L97.928 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 Facility Procedures CPT4 Code Description  Modifier Quantity [id number] - WOUND CARE VISIT-LEV 3 EST PT 25 1 [id number] (Facility Use Only) [id number] - APPLY MULTLAY COMPRS LWR LT LEG 1 Physician Procedures Quantity CPT4 Code Description Modifier [id number] - WC PHYS LEVEL 3 - EST PT 1 ICD-10 Diagnosis Description 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 L97.928 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 Electronic Signature(s) Signed: 01/22/2022 5:01:06 PM By: Linton Ham MD Entered By: Linton Ham on 01/22/2022 14:29:19

## 2022-01-24 NOTE — Progress Notes (Signed)
ANTONAE, ZBIKOWSKI ([id number]) [id number] Page 1 of 9 Visit Report for 01/22/2022 Allergy List Details Patient Name: Date of Service: [name], [name] 01/22/2022 1:30 PM Medical Record Number: [id number] Patient Account Number: [id number] Date of Birth/Sex: Treating RN: Oct 05, 1955 (66 y.o. [name] Primary Care Nilah Belcourt: [name] Other Clinician: Referring Janice Seales: Treating Sameena Artus/Extender: Norval Gable, Murvin Natal in Treatment: 0 Allergies Active Allergies No Known Drug Allergies Allergy Notes Electronic Signature(s) Signed: 01/22/2022 4:35:13 PM By: Sharyn Creamer RN, BSN Entered By: Sharyn Creamer on 01/22/2022 13:50:41 -------------------------------------------------------------------------------- Arrival Information Details Patient Name: Date of Service: Erin Face [name] 01/22/2022 1:30 PM Medical Record Number: [id number] Patient Account Number: [id number] Date of Birth/Sex: Treating RN: 1955/10/01 (66 y.o. F) Primary Care Omer Puccinelli: [name] Other Clinician: Referring Jaycob Mcclenton: Treating Shyla Gayheart/Extender: Milus Banister in Treatment: 0 Visit Information Patient Arrived: Lyndel Pleasure Time: 13:46 Accompanied By: self Transfer Assistance: None Patient Identification Verified: Yes Secondary Verification Process Completed: Yes History Since Last Visit All ordered tests and consults were completed: No Added or deleted any medications: No Any new allergies or adverse reactions: No Had a fall or experienced change in activities of daily living that may affect risk of falls: No Signs or symptoms of abuse/neglect since last visito No Hospitalized since last visit: No Implantable device outside of the clinic excluding cellular tissue based products placed in the center since last visit: No Electronic Signature(s) Signed: 01/22/2022 3:52:57 PM By: Worthy Rancher Entered By: Worthy Rancher on  01/22/2022 13:46:51 [name] ([id number]) [id number] Page 2 of 9 -------------------------------------------------------------------------------- Clinic Level of Care Assessment Details Patient Name: Date of Service: [name], [name] 01/22/2022 1:30 PM Medical Record Number: [id number] Patient Account Number: [id number] Date of Birth/Sex: Treating RN: [birth year] (66 y.o. [name] Primary Care Ranay Ketter: [name] Other Clinician: Referring Jacky Dross: Treating Rana Hochstein/Extender: Milus Banister in Treatment: 0 Clinic Level of Care Assessment Items TOOL 1 Quantity Score X- 1 0 Use when EandM and Procedure is performed on INITIAL visit ASSESSMENTS - Nursing Assessment / Reassessment X- 1 20 General Physical Exam (combine w/ comprehensive assessment (listed just below) when performed on new pt. evals) X- 1 25 Comprehensive Assessment (HX, ROS, Risk Assessments, Wounds Hx, etc.) ASSESSMENTS - Wound and Skin Assessment / Reassessment '[]'$  - 0 Dermatologic / Skin Assessment (not related to wound area) ASSESSMENTS - Ostomy and/or Continence Assessment and Care '[]'$  - 0 Incontinence Assessment and Management '[]'$  - 0 Ostomy Care Assessment and Management (repouching, etc.) PROCESS - Coordination of Care X - Simple Patient / Family Education for ongoing care 1 15 '[]'$  - 0 Complex (extensive) Patient / Family Education for ongoing care X- 1 10 Staff obtains Programmer, systems, Records, T Results / Process Orders est '[]'$  - 0 Staff telephones HHA, Nursing Homes / Clarify orders / etc '[]'$  - 0 Routine Transfer to another Facility (non-emergent condition) '[]'$  - 0 Routine Hospital Admission (non-emergent condition) X- 1 15 New Admissions / Biomedical engineer / Ordering NPWT Apligraf, etc. , '[]'$  - 0 Emergency Hospital Admission (emergent condition) PROCESS - Special Needs '[]'$  - 0 Pediatric / Minor Patient Management '[]'$  -  0 Isolation Patient Management '[]'$  - 0 Hearing / Language / Visual special needs '[]'$  - 0 Assessment of Community assistance (transportation, [name]/C planning, etc.) '[]'$  - 0 Additional assistance / Altered mentation '[]'$  - 0 Support Surface(s) Assessment (bed, cushion, seat, etc.) INTERVENTIONS - Miscellaneous '[]'$  - 0 External ear exam '[]'$  - 0 Patient Transfer (multiple  staff / Harrel Lemon Lift / Similar devices) '[]'$  - 0 Simple Staple / Suture removal (25 or less) '[]'$  - 0 Complex Staple / Suture removal (26 or more) '[]'$  - 0 Hypo/Hyperglycemic Management (do not check if billed separately) '[]'$  - 0 Ankle / Brachial Index (ABI) - do not check if billed separately Has the patient been seen at the hospital within the last three years: Yes Total Score: 85 Level Of Care: New/Established - Level 3 Electronic Signature(s) Signed: 01/22/2022 4:59:49 PM By: Adline Peals Entered By: Adline Peals on 01/22/2022 14:13:16 [name] ([id number]) [id number] Page 3 of 9 -------------------------------------------------------------------------------- Compression Therapy Details Patient Name: Date of Service: [name], [name] 01/22/2022 1:30 PM Medical Record Number: [id number] Patient Account Number: [id number] Date of Birth/Sex: Treating RN: 06-Sep-1955 (66 y.o. [name] Primary Care William Schake: [name] Other Clinician: Referring Montavius Subramaniam: Treating Tyan Dy/Extender: Milus Banister in Treatment: 0 Compression Therapy Performed for Wound Assessment: Wound #7 Left,Anterior Lower Leg Performed By: Clinician Blanche East, RN Compression Type: Four Layer Post Procedure Diagnosis Same as Pre-procedure Electronic Signature(s) Signed: 01/22/2022 4:59:49 PM By: Sabas Sous By: Adline Peals on 01/22/2022 14:12:54 -------------------------------------------------------------------------------- Encounter  Discharge Information Details Patient Name: Date of Service: Erin Gerold NITA [name] 01/22/2022 1:30 PM Medical Record Number: [id number] Patient Account Number: [id number] Date of Birth/Sex: Treating RN: Mar 11, 1955 (66 y.o. [name] Primary Care Cayden Granholm: [name] Other Clinician: Referring Jeremy Ditullio: Treating Erastus Bartolomei/Extender: Milus Banister in Treatment: 0 Encounter Discharge Information Items Discharge Condition: Stable Ambulatory Status: Cane Discharge Destination: Home Transportation: Private Auto Accompanied By: self Schedule Follow-up Appointment: Yes Clinical Summary of Care: Patient Declined Electronic Signature(s) Signed: 01/22/2022 4:59:49 PM By: Sabas Sous By: Adline Peals on 01/22/2022 14:14:15 -------------------------------------------------------------------------------- Lower Extremity Assessment Details Patient Name: Date of Service: [name], [name] 01/22/2022 1:30 PM Medical Record Number: [id number] Patient Account Number: [id number] Date of Birth/Sex: Treating RN: 1955-07-29 (66 y.o. [name] Primary Care Eiza Canniff: [name] Other Clinician: Referring Avigail Pilling: Treating Sanjuanita Condrey/Extender: Milus Banister in Treatment: 0 Edema Assessment Left: [Left: Right] [Right: :] Assessed: [Left: Yes] [Right: No] [Left: Edema] [Right: :] Calf Left: Right: Point of Measurement: From Medial Instep 46.5 cm Ankle Left: Right: Point of Measurement: From Medial Instep 23 cm Vascular Assessment Pulses: Dorsalis Pedis Palpable: [Left:Yes] Electronic Signature(s) Signed: 01/22/2022 4:35:13 PM By: Sharyn Creamer RN, BSN Entered By: Sharyn Creamer on 01/22/2022 13:55:17 -------------------------------------------------------------------------------- Multi Wound Chart Details Patient Name: Date of Service: Erin Face [name] 01/22/2022 1:30 PM Medical Record Number:  [id number] Patient Account Number: [id number] Date of Birth/Sex: Treating RN: Nov 19, 1955 (66 y.o. F) Primary Care Devean Skoczylas: [name] Other Clinician: Referring Marquette Piontek: Treating Zarahi Fuerst/Extender: Milus Banister in Treatment: 0 Vital Signs Height(in): 70 Pulse(bpm): 109 Weight(lbs): 202 Blood Pressure(mmHg): 142/82 Body Mass Index(BMI): 40.6 Temperature(F): 97.7 Respiratory Rate(breaths/min): 20 [7:Photos:] [N/A:N/A] Left, Anterior Lower Leg N/A N/A Wound Location: Blister N/A N/A Wounding Event: Lymphedema N/A N/A Primary Etiology: Cataracts, Lymphedema, N/A N/A Comorbid History: Hypertension, Peripheral Venous Disease, Osteoarthritis 01/22/2022 N/A N/A Date Acquired: 0 N/A N/A Weeks of Treatment: Open N/A N/A Wound Status: No N/A N/A Wound Recurrence: 1.8x2.2x0.2 N/A N/A Measurements L x W x [name] 3.11 N/A N/A A (cm) : rea 0.622 N/A N/A Volume (cm) : Full Thickness Without Exposed N/A N/A Classification: Support Structures Medium N/A N/A Exudate Amount: Serosanguineous N/A N/A Exudate Type: red, brown N/A N/A Exudate ColorBREKYN, [name] ([id number]) [id number] Page  5 of 9 Large (67-100%) N/A N/A Granulation Amount: Red N/A N/A Granulation Quality: Small (1-33%) N/A N/A Necrotic Amount: Fat Layer (Subcutaneous Tissue): Yes N/A N/A Exposed Structures: Fascia: No Tendon: No Muscle: No Joint: No Bone: No None N/A N/A Epithelialization: Erythema: Yes N/A N/A Periwound Skin Color: Circumferential N/A N/A Erythema Location: Compression Therapy N/A N/A Procedures Performed: Treatment Notes Wound #7 (Lower Leg) Wound Laterality: Left, Anterior Cleanser Soap and Water Discharge Instruction: May shower and wash wound with dial antibacterial soap and water prior to dressing change. Wound Cleanser Discharge Instruction: Cleanse the wound with wound cleanser prior to applying a clean  dressing using gauze sponges, not tissue or cotton balls. Peri-Wound Care Topical Primary Dressing Sorbalgon AG Dressing 2x2 (in/in) Discharge Instruction: Apply to wound bed as instructed Secondary Dressing ABD Pad, 5x9 Discharge Instruction: Apply over primary dressing as directed. Woven Gauze Sponge, Non-Sterile 4x4 in Discharge Instruction: Apply over primary dressing as directed. Secured With Transpore Surgical Tape, 2x10 (in/yd) Discharge Instruction: Secure dressing with tape as directed. Compression Wrap FourPress (4 layer compression wrap) Discharge Instruction: Apply four layer compression as directed. May also use Miliken CoFlex 2 layer compression system as alternative. Compression Stockings Add-Ons Electronic Signature(s) Signed: 01/22/2022 5:01:06 PM By: Linton Ham MD Entered By: Linton Ham on 01/22/2022 14:20:25 -------------------------------------------------------------------------------- Multi-Disciplinary Care Plan Details Patient Name: Date of Service: Erin Gerold NITA [name] 01/22/2022 1:30 PM Medical Record Number: [id number] Patient Account Number: [id number] Date of Birth/Sex: Treating RN: 30-Nov-1955 (66 y.o. [name] Primary Care Nicolaus Andel: [name] Other Clinician: Referring Serra Younan: Treating Sencere Symonette/Extender: Milus Banister in Treatment: [address] JAELAH, HAUTH ([id number]) [id number] Page 6 of 9 Abuse / Safety / Falls / Self Care Management Nursing Diagnoses: History of Falls Impaired physical mobility Goals: Patient/caregiver will identify factors that restrict self-care and home management Date Initiated: 01/22/2022 Target Resolution Date: 03/23/2022 Goal Status: Active Interventions: Assess fall risk on admission and as needed Assess: immobility, friction, shearing, incontinence upon admission and as needed Notes: Wound/Skin Impairment Nursing  Diagnoses: Impaired tissue integrity Knowledge deficit related to ulceration/compromised skin integrity Goals: Patient/caregiver will verbalize understanding of skin care regimen Date Initiated: 01/22/2022 Target Resolution Date: 03/23/2022 Goal Status: Active Interventions: Assess ulceration(s) every visit Treatment Activities: Skin care regimen initiated : 01/22/2022 Topical wound management initiated : 01/22/2022 Notes: Electronic Signature(s) Signed: 01/22/2022 4:59:49 PM By: Adline Peals Entered By: Adline Peals on 01/22/2022 14:10:37 -------------------------------------------------------------------------------- Pain Assessment Details Patient Name: Date of Service: [name], [name] 01/22/2022 1:30 PM Medical Record Number: [id number] Patient Account Number: [id number] Date of Birth/Sex: Treating RN: 03-Oct-1955 (66 y.o. [name] Primary Care Phillp Dolores: [name] Other Clinician: Referring Lashena Signer: Treating Analei Whinery/Extender: Milus Banister in Treatment: 0 Active Problems Location of Pain Severity and Description of Pain Patient Has Paino No Site Locations [name], [name] ([id number]) [phone].pdf Page 7 of 9 Pain Management and Medication Current Pain Management: Electronic Signature(s) Signed: 01/22/2022 4:35:13 PM By: Sharyn Creamer RN, BSN Signed: 01/24/2022 2:56:09 PM By: [name] RN Entered By: [name] on 01/22/2022 14:00:17 -------------------------------------------------------------------------------- Patient/Caregiver Education Details Patient Name: Date of Service: [name], [name] 12/11/2023andnbsp1:30 PM Medical Record Number: [id number] Patient Account Number: [id number] Date of Birth/Gender: Treating RN: 07-27-1955 (66 y.o. [name] Primary Care Physician: [name] Other Clinician: Referring Physician: Treating Physician/Extender: Milus Banister in Treatment: 0 Education Assessment Education Provided To: Patient Education Topics Provided Wound Debridement: Methods: Explain/Verbal Responses: Reinforcements needed, State  content correctly Electronic Signature(s) Signed: 01/22/2022 4:59:49 PM By: Adline Peals Entered By: Adline Peals on 01/22/2022 14:10:53 -------------------------------------------------------------------------------- Wound Assessment Details Patient Name: Date of Service: JOANANN, MIES [name] 01/22/2022 1:30 PM Medical Record Number: [id number] Patient Account Number: [id number] Date of Birth/Sex: Treating RN: [birth year] (65 y.o. [name] Primary Care Ervie Mccard: [name] Other Clinician: SHANDRIKA, AMBERS ([id number]) [id number] Page 8 of 9 Referring Karel Mowers: Treating Meghin Thivierge/Extender: Norval Gable, Murvin Natal in Treatment: 0 Wound Status Wound Number: 7 Primary Lymphedema Etiology: Wound Location: Left, Anterior Lower Leg Wound Open Wounding Event: Blister Status: Date Acquired: 01/22/2022 Comorbid Cataracts, Lymphedema, Hypertension, Peripheral Venous Weeks Of Treatment: 0 History: Disease, Osteoarthritis Clustered Wound: No Photos Wound Measurements Length: (cm) 1.8 Width: (cm) 2.2 Depth: (cm) 0.2 Area: (cm) 3.11 Volume: (cm) 0.622 % Reduction in Area: % Reduction in Volume: Epithelialization: None Tunneling: No Undermining: No Wound Description Classification: Full Thickness Without Exposed Support Structures Exudate Amount: Medium Exudate Type: Serosanguineous Exudate Color: red, brown Foul Odor After Cleansing: No Slough/Fibrino Yes Wound Bed Granulation Amount: Large (67-100%) Exposed Structure Granulation Quality: Red Fascia Exposed: No Necrotic Amount: Small (1-33%) Fat Layer (Subcutaneous Tissue) Exposed: Yes Necrotic Quality: Adherent Slough Tendon Exposed: No Muscle  Exposed: No Joint Exposed: No Bone Exposed: No Periwound Skin Texture Texture Color No Abnormalities Noted: No No Abnormalities Noted: No Erythema: Yes Moisture Erythema Location: Circumferential No Abnormalities Noted: No Treatment Notes Wound #7 (Lower Leg) Wound Laterality: Left, Anterior Cleanser Soap and Water Discharge Instruction: May shower and wash wound with dial antibacterial soap and water prior to dressing change. Wound Cleanser Discharge Instruction: Cleanse the wound with wound cleanser prior to applying a clean dressing using gauze sponges, not tissue or cotton balls. Peri-Wound Care Topical Primary Dressing Sorbalgon AG Dressing 2x2 (in/in) Discharge Instruction: Apply to wound bed as instructed Secondary Dressing ABD Pad, 5x9 PAIGHTON, GODETTE ([id number]) [id number].pdf Page 9 of 9 Discharge Instruction: Apply over primary dressing as directed. Woven Gauze Sponge, Non-Sterile 4x4 in Discharge Instruction: Apply over primary dressing as directed. Secured With Transpore Surgical Tape, 2x10 (in/yd) Discharge Instruction: Secure dressing with tape as directed. Compression Wrap FourPress (4 layer compression wrap) Discharge Instruction: Apply four layer compression as directed. May also use Miliken CoFlex 2 layer compression system as alternative. Compression Stockings Add-Ons Electronic Signature(s) Signed: 01/22/2022 4:35:13 PM By: Sharyn Creamer RN, BSN Signed: 01/24/2022 2:56:09 PM By: [name] RN Entered By: [name] on 01/22/2022 13:59:41 -------------------------------------------------------------------------------- Vitals Details Patient Name: Date of Service: Erin Face [name] 01/22/2022 1:30 PM Medical Record Number: [id number] Patient Account Number: [id number] Date of Birth/Sex: Treating RN: [birth year] (66 y.o. F) Primary Care Marcelle Bebout: [name] Other Clinician: Referring Reyn Faivre: Treating  Jaquavian Firkus/Extender: Milus Banister in Treatment: 0 Vital Signs Time Taken: 01:40 Temperature (F): 97.7 Height (in): 62 Pulse (bpm): 109 Weight (lbs): 222 Respiratory Rate (breaths/min): 20 Body Mass Index (BMI): 40.6 Blood Pressure (mmHg): 142/82 Reference Range: 80 - 120 mg / dl Electronic Signature(s) Signed: 01/22/2022 3:52:57 PM By: Worthy Rancher Entered By: Worthy Rancher on 01/22/2022 13:47:59

## 2022-01-29 ENCOUNTER — Encounter (HOSPITAL_BASED_OUTPATIENT_CLINIC_OR_DEPARTMENT_OTHER): Payer: 59 | Admitting: Internal Medicine

## 2022-01-29 DIAGNOSIS — I739 Peripheral vascular disease, unspecified: Secondary | ICD-10-CM | POA: Diagnosis not present

## 2022-01-29 DIAGNOSIS — E039 Hypothyroidism, unspecified: Secondary | ICD-10-CM | POA: Diagnosis not present

## 2022-01-29 DIAGNOSIS — I872 Venous insufficiency (chronic) (peripheral): Secondary | ICD-10-CM | POA: Diagnosis not present

## 2022-01-29 DIAGNOSIS — I890 Lymphedema, not elsewhere classified: Secondary | ICD-10-CM | POA: Diagnosis not present

## 2022-01-29 DIAGNOSIS — I10 Essential (primary) hypertension: Secondary | ICD-10-CM | POA: Diagnosis not present

## 2022-01-29 DIAGNOSIS — L97822 Non-pressure chronic ulcer of other part of left lower leg with fat layer exposed: Secondary | ICD-10-CM | POA: Diagnosis not present

## 2022-01-29 DIAGNOSIS — L97928 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: Secondary | ICD-10-CM | POA: Diagnosis not present

## 2022-01-29 DIAGNOSIS — M1990 Unspecified osteoarthritis, unspecified site: Secondary | ICD-10-CM | POA: Diagnosis not present

## 2022-01-30 ENCOUNTER — Encounter: Payer: Self-pay | Admitting: Internal Medicine

## 2022-01-30 ENCOUNTER — Other Ambulatory Visit: Payer: Self-pay | Admitting: Nurse Practitioner

## 2022-01-30 MED ORDER — WEGOVY 1 MG/0.5ML ~~LOC~~ SOAJ
1.0000 mg | SUBCUTANEOUS | 1 refills | Status: DC
  Filled 2022-01-30: qty 2, 28d supply, fill #0

## 2022-01-30 NOTE — Progress Notes (Signed)
SIMRIT, GOHLKE ([id number]) [id number] Page 1 of 7 Visit Report for 01/29/2022 Arrival Information Details Patient Name: Date of Service: MAO, LOCKNER 01/29/2022 9:30 A M Medical Record Number: [id number] Patient Account Number: [id number] Date of Birth/Sex: Treating RN: 04/29/1955 (66 y.o. Marta Lamas Primary Care Janiesha Diehl: [name] Other Clinician: Referring Zahid Carneiro: Treating Jolene Guyett/Extender: Milus Banister in Treatment: 1 Visit Information History Since Last Visit Added or deleted any medications: No Patient Arrived: [name] Any new allergies or adverse reactions: No Arrival Time: 09:44 Signs or symptoms of abuse/neglect since last visito No Accompanied By: self Hospitalized since last visit: No Transfer Assistance: None Implantable device outside of the clinic excluding No Patient Identification Verified: Yes cellular tissue based products placed in the center Secondary Verification Process Completed: Yes since last visit: Has Compression in Place as Prescribed: Yes Pain Present Now: No Electronic Signature(s) Signed: 01/29/2022 5:09:12 PM By: Blanche East RN Entered By: Blanche East on 01/29/2022 09:45:03 -------------------------------------------------------------------------------- Compression Therapy Details Patient Name: Date of Service: [name] 01/29/2022 9:30 A M Medical Record Number: [id number] Patient Account Number: [id number] Date of Birth/Sex: Treating RN: [birth year] (66 y.o. Marta Lamas Primary Care Alazae Crymes: [name] Other Clinician: Referring Moncerrath Berhe: Treating Francesa Eugenio/Extender: Milus Banister in Treatment: 1 Compression Therapy Performed for Wound Assessment: Wound #7 Left,Anterior Lower Leg Performed By: Clinician Blanche East, RN Compression Type: Four Layer Pre Treatment ABI: 1.2 Post Procedure Diagnosis Same as  Pre-procedure Electronic Signature(s) Signed: 01/29/2022 5:09:12 PM By: Blanche East RN Entered By: Blanche East on 01/29/2022 10:04:27 -------------------------------------------------------------------------------- Encounter Discharge Information Details Patient Name: Date of Service: [name] 01/29/2022 9:30 A Rulon Eisenmenger, Doroteo Bradford D ([id number]) [id number] Page 2 of 7 Medical Record Number: [id number] Patient Account Number: [id number] Date of Birth/Sex: Treating RN: [birth year] (66 y.o. Marta Lamas Primary Care Naketa Daddario: [name] Other Clinician: Referring Binnie Droessler: Treating Shantell Belongia/Extender: Milus Banister in Treatment: 1 Encounter Discharge Information Items Post Procedure Vitals Discharge Condition: Stable Temperature (F): 97.9 Ambulatory Status: Cane Pulse (bpm): 99 Discharge Destination: Home Respiratory Rate (breaths/min): 18 Transportation: Private Auto Blood Pressure (mmHg): 147/84 Accompanied By: self Schedule Follow-up Appointment: Yes Clinical Summary of Care: Electronic Signature(s) Signed: 01/29/2022 5:09:12 PM By: Blanche East RN Entered By: Blanche East on 01/29/2022 10:16:43 -------------------------------------------------------------------------------- Lower Extremity Assessment Details Patient Name: Date of Service: ZLATY, ALEXA D. 01/29/2022 9:30 A M Medical Record Number: [id number] Patient Account Number: [id number] Date of Birth/Sex: Treating RN: 1955/12/15 (66 y.o. Marta Lamas Primary Care Irini Leet: [name] Other Clinician: Referring Camry Robello: Treating Liviana Mills/Extender: Milus Banister in Treatment: 1 Edema Assessment Assessed: [Left: No] [Right: No] [Left: Edema] [Right: :] Calf Left: Right: Point of Measurement: From Medial Instep 46 cm Ankle Left: Right: Point of Measurement: From Medial Instep 21.5 cm Vascular  Assessment Pulses: Dorsalis Pedis Palpable: [Left:Yes] Electronic Signature(s) Signed: 01/29/2022 5:09:12 PM By: Blanche East RN Entered By: Blanche East on 01/29/2022 09:46:10 -------------------------------------------------------------------------------- Multi Wound Chart Details Patient Name: Date of Service: [name] 01/29/2022 9:30 A M Medical Record Number: [id number] Patient Account Number: [id number] Date of Birth/Sex: Treating RN: September 21, 1955 (66 y.o. AERIAL, DILLEY D ([id number]) [id number] Page 3 of 7 Primary Care Lem Peary: [name] Other Clinician: Referring Fawnda Vitullo: Treating Akeen Ledyard/Extender: Milus Banister in Treatment: 1 Vital Signs Height(in): 62 Pulse(bpm): 99 Weight(lbs): 973 Blood Pressure(mmHg): 147/84 Body Mass Index(BMI): 40.6 Temperature(F): 97.9 Respiratory Rate(breaths/min): 18 [7:Photos: No Photos Left, Anterior  Lower Leg Wound Location: Blister Wounding Event: Lymphedema Primary Etiology: Cataracts, Lymphedema, Comorbid History: Hypertension, Peripheral Venous Disease, Osteoarthritis 01/22/2022 Date Acquired: 1 Weeks of Treatment: Open  Wound Status: No Wound Recurrence: 1.8x2x0.2 Measurements L x W x D (cm) 2.827 A (cm) : rea 0.565 Volume (cm) : 9.10% % Reduction in A rea: 9.20% % Reduction in Volume: Full Thickness Without Exposed Classification: Support Structures Medium Exudate A  mount: Serosanguineous Exudate Type: red, brown Exudate Color: Large (67-100%) Granulation A mount: Red Granulation Quality: Small (1-33%) Necrotic A mount: Fat Layer (Subcutaneous Tissue): Yes N/A Exposed Structures: Fascia: No Tendon: No Muscle: No  Joint: No Bone: No None Epithelialization: Debridement - Excisional Debridement: Pre-procedure Verification/Time Out 10:01 Taken: Lidocaine 5% topical ointment Pain Control: Subcutaneous, Slough Tissue Debrided: Skin/Subcutaneous Tissue Level: 3.6   Debridement A (sq cm): rea Curette Instrument: Minimum Bleeding: Pressure Hemostasis A chieved: 0 Procedural Pain: 0 Post Procedural Pain: Procedure was tolerated well Debridement Treatment Response: 1.8x2x0.2 Post Debridement Measurements L x W x D (cm)  0.565 Post Debridement Volume: (cm) No Abnormalities Noted Periwound Skin Texture: Dry/Scaly: Yes Periwound Skin Moisture: Erythema: Yes Periwound Skin Color: Circumferential Erythema Location: Compression Therapy Procedures Performed: Debridement]  [N/A:N/A N/A N/A N/A N/A N/A N/A N/A N/A N/A N/A N/A N/A N/A N/A N/A N/A N/A N/A N/A N/A N/A N/A N/A N/A N/A N/A N/A N/A N/A N/A N/A N/A N/A N/A N/A N/A N/A N/A N/A N/A] Treatment Notes Wound #7 (Lower Leg) Wound Laterality: Left, Anterior Cleanser Soap and Water Discharge Instruction: May shower and wash wound with dial antibacterial soap and water prior to dressing change. Wound Cleanser Discharge Instruction: Cleanse the wound with wound cleanser prior to applying a clean dressing using gauze sponges, not tissue or cotton balls. Peri-Wound Care Topical LYSA, LIVENGOOD ([id number]) [id number] Page 4 of 7 Primary Dressing Sorbalgon AG Dressing 2x2 (in/in) Discharge Instruction: Apply to wound bed as instructed Secondary Dressing ABD Pad, 5x9 Discharge Instruction: Apply over primary dressing as directed. Woven Gauze Sponge, Non-Sterile 4x4 in Discharge Instruction: Apply over primary dressing as directed. Secured With Transpore Surgical Tape, 2x10 (in/yd) Discharge Instruction: Secure dressing with tape as directed. Compression Wrap FourPress (4 layer compression wrap) Discharge Instruction: Apply four layer compression as directed. May also use Miliken CoFlex 2 layer compression system as alternative. Compression Stockings Add-Ons Electronic Signature(s) Signed: 01/29/2022 5:06:55 PM By: Linton Ham MD Entered By: Linton Ham on 01/29/2022  10:29:53 -------------------------------------------------------------------------------- Multi-Disciplinary Care Plan Details Patient Name: Date of Service: [name] 01/29/2022 9:30 A M Medical Record Number: [id number] Patient Account Number: [id number] Date of Birth/Sex: Treating RN: 1955/07/16 (66 y.o. Marta Lamas Primary Care Jari Carollo: [name] Other Clinician: Referring Rod Majerus: Treating Cortlynn Hollinsworth/Extender: Milus Banister in Treatment: 1 Active Inactive Abuse / Safety / Falls / Self Care Management Nursing Diagnoses: History of Falls Impaired physical mobility Goals: Patient/caregiver will identify factors that restrict self-care and home management Date Initiated: 01/22/2022 Target Resolution Date: 03/23/2022 Goal Status: Active Interventions: Assess fall risk on admission and as needed Assess: immobility, friction, shearing, incontinence upon admission and as needed Notes: Wound/Skin Impairment Nursing Diagnoses: Impaired tissue integrity Knowledge deficit related to ulceration/compromised skin integrity Goals: Patient/caregiver will verbalize understanding of skin care regimen Date Initiated: 01/22/2022 Target Resolution Date: 03/23/2022 Goal Status: [address] HERTA, HINK ([id number]) [id number].pdf Page 5 of 7 Interventions: Assess ulceration(s) every visit Treatment Activities: Skin care regimen initiated : 01/22/2022 Topical wound management initiated : 01/22/2022 Notes: Electronic Signature(s) Signed:  01/29/2022 5:09:12 PM By: Blanche East RN Entered By: Blanche East on 01/29/2022 10:04:50 -------------------------------------------------------------------------------- Pain Assessment Details Patient Name: Date of Service: KRYSTLE, POLCYN D. 01/29/2022 9:30 A M Medical Record Number: [id number] Patient Account Number: [id number] Date of Birth/Sex: Treating RN: [birth year] (66 y.o. Marta Lamas Primary Care Kendre Jacinto: [name] Other Clinician: Referring Keishawna Carranza: Treating Lanson Randle/Extender: Milus Banister in Treatment: 1 Active Problems Location of Pain Severity and Description of Pain Patient Has Paino No Site Locations Rate the pain. Current Pain Level: 0 Pain Management and Medication Current Pain Management: Electronic Signature(s) Signed: 01/29/2022 5:09:12 PM By: Blanche East RN Entered By: Blanche East on 01/29/2022 09:45:35 -------------------------------------------------------------------------------- Patient/Caregiver Education Details Patient Name: Date of Service: [name] 12/18/2023andnbsp9:30 [place] Record Number: [id number] Patient Account Number: [id number] Date of Birth/Gender: Treating RN: 1955-07-20 (66 y.o. [address] [place], [place] D ([id number]) [id number] Page 6 of 7 Primary Care Physician: [name] Other Clinician: Referring Physician: Treating Physician/Extender: Milus Banister in Treatment: 1 Education Assessment Education Provided To: Patient Education Topics Provided Wound/Skin Impairment: Methods: Explain/Verbal Responses: Reinforcements needed, State content correctly Electronic Signature(s) Signed: 01/29/2022 5:09:12 PM By: Blanche East RN Entered By: Blanche East on 01/29/2022 10:05:04 -------------------------------------------------------------------------------- Wound Assessment Details Patient Name: Date of Service: [name] 01/29/2022 9:30 A M Medical Record Number: [id number] Patient Account Number: [id number] Date of Birth/Sex: Treating RN: May 19, 1955 (66 y.o. Iver Nestle, [name] Primary Care Sudiksha Victor: [name] Other Clinician: Referring [place] Natarajan: Treating Daijah Scrivens/Extender: Milus Banister in Treatment: 1 Wound Status Wound Number: 7 Primary  Lymphedema Etiology: Wound Location: Left, Anterior Lower Leg Wound Open Wounding Event: Blister Status: Date Acquired: 01/22/2022 Comorbid Cataracts, Lymphedema, Hypertension, Peripheral Venous Weeks Of Treatment: 1 History: Disease, Osteoarthritis Clustered Wound: No Wound Measurements Length: (cm) 1.8 Width: (cm) 2 Depth: (cm) 0.2 Area: (cm) 2.827 Volume: (cm) 0.565 % Reduction in Area: 9.1% % Reduction in Volume: 9.2% Epithelialization: None Tunneling: No Undermining: No Wound Description Classification: Full Thickness Without Exposed Suppor Exudate Amount: Medium Exudate Type: Serosanguineous Exudate Color: red, brown t Structures Foul Odor After Cleansing: No Slough/Fibrino Yes Wound Bed Granulation Amount: Large (67-100%) Exposed Structure Granulation Quality: Red Fascia Exposed: No Necrotic Amount: Small (1-33%) Fat Layer (Subcutaneous Tissue) Exposed: Yes Necrotic Quality: Adherent Slough Tendon Exposed: No Muscle Exposed: No Joint Exposed: No Bone Exposed: No Periwound Skin Texture Texture Color No Abnormalities Noted: Yes No Abnormalities Noted: No Erythema: Yes Moisture Erythema Location: Circumferential No Abnormalities Noted: No JAMEELA, MICHNA ([id number]) [id number] Page 7 of 7 Dry / Scaly: Yes Treatment Notes Wound #7 (Lower Leg) Wound Laterality: Left, Anterior Cleanser Soap and Water Discharge Instruction: May shower and wash wound with dial antibacterial soap and water prior to dressing change. Wound Cleanser Discharge Instruction: Cleanse the wound with wound cleanser prior to applying a clean dressing using gauze sponges, not tissue or cotton balls. Peri-Wound Care Topical Primary Dressing Sorbalgon AG Dressing 2x2 (in/in) Discharge Instruction: Apply to wound bed as instructed Secondary Dressing ABD Pad, 5x9 Discharge Instruction: Apply over primary dressing as directed. Woven Gauze Sponge,  Non-Sterile 4x4 in Discharge Instruction: Apply over primary dressing as directed. Secured With Transpore Surgical Tape, 2x10 (in/yd) Discharge Instruction: Secure dressing with tape as directed. Compression Wrap FourPress (4 layer compression wrap) Discharge Instruction: Apply four layer compression as directed. May also use Miliken CoFlex 2 layer compression system as alternative. Compression Stockings Add-Ons Electronic Signature(s) Signed: 01/29/2022 5:09:12 PM By: Willey Blade,  [name] RN Entered By: Blanche East on 01/29/2022 09:50:29 -------------------------------------------------------------------------------- Vitals Details Patient Name: Date of Service: AKAYSHA, COBERN 01/29/2022 9:30 A M Medical Record Number: [id number] Patient Account Number: [id number] Date of Birth/Sex: Treating RN: April 25, 1955 (66 y.o. Iver Nestle, [name] Primary Care Marisel Tostenson: [name] Other Clinician: Referring Luba Matzen: Treating Anabeth Chilcott/Extender: Milus Banister in Treatment: 1 Vital Signs Time Taken: 09:39 Temperature (F): 97.9 Height (in): 62 Pulse (bpm): 99 Weight (lbs): 222 Respiratory Rate (breaths/min): 18 Body Mass Index (BMI): 40.6 Blood Pressure (mmHg): 147/84 Reference Range: 80 - 120 mg / dl Electronic Signature(s) Signed: 01/29/2022 5:09:12 PM By: Blanche East RN Entered By: Blanche East on 01/29/2022 09:45:28

## 2022-01-30 NOTE — Progress Notes (Signed)
Sent Rx for wegovy to pharmacy

## 2022-01-30 NOTE — Progress Notes (Signed)
DAWNELL, BRYANT ([id number]) [id number] Page 1 of 10 Visit Report for 01/29/2022 Debridement Details Patient Name: Date of Service: [name], [name] 01/29/2022 9:30 A M Medical Record Number: [id number] Patient Account Number: [id number] Date of Birth/Sex: Treating RN: 11-14-1955 (66 y.o. Iver Nestle, [name] Primary Care Provider: [name] Other Clinician: Referring Provider: Treating Provider/Extender: Milus Banister in Treatment: 1 Debridement Performed for Assessment: Wound #7 Left,Anterior Lower Leg Performed By: Physician Ricard Dillon., MD Debridement Type: Debridement Level of Consciousness (Pre-procedure): Awake and Alert Pre-procedure Verification/Time Out Yes - 10:01 Taken: Start Time: 10:02 Pain Control: Lidocaine 5% topical ointment T Area Debrided (L x W): otal 1.8 (cm) x 2 (cm) = 3.6 (cm) Tissue and other material debrided: Non-Viable, Slough, Subcutaneous, Slough Level: Skin/Subcutaneous Tissue Debridement Description: Excisional Instrument: Curette Bleeding: Minimum Hemostasis Achieved: Pressure Procedural Pain: 0 Post Procedural Pain: 0 Response to Treatment: Procedure was tolerated well Level of Consciousness (Post- Awake and Alert procedure): Post Debridement Measurements of Total Wound Length: (cm) 1.8 Width: (cm) 2 Depth: (cm) 0.2 Volume: (cm) 0.565 Character of Wound/Ulcer Post Debridement: Requires Further Debridement Post Procedure Diagnosis Same as Pre-procedure Notes Scribed for Dr. Dellia Nims by Blanche East, RN Electronic Signature(s) Signed: 01/29/2022 5:06:55 PM By: Linton Ham MD Signed: 01/29/2022 5:09:12 PM By: Blanche East RN Entered By: Blanche East on 01/29/2022 10:04:03 -------------------------------------------------------------------------------- HPI Details Patient Name: Date of Service: Erin Face [name] 01/29/2022 9:30 A M Medical Record Number:  [id number] Patient Account Number: [id number] Date of Birth/Sex: Treating RN: 1955-10-22 (66 y.o. 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 She tells [name] that she fell on the gravel driveway in July. She developed injuries on her distal lower leg which have not healed. She saw her primary physician on 11/15/2017 who noted her left shin injuries. Gave her antibiotics. At that point the wounds were almost circumferential however most were less than 1.5 cm. Weeping edema fluid was noted. She was referred here for evaluation. The patient has a history of chronic lower extremity edema. 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 She has had prior history with leg weeping fluid. She does not wear compression stockings. She is not doing anything specific to these wound areas. 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 She also has lymphedema. Put her in 3 layer compression and silver alginate last week. She has 3 small wounds with depth just lateral to the tibia. More problematically than this she has numerous shallow areas some of which are almost canal like in shape with tightly adherent painful debris. It would be very difficult and time- consuming to go through this and attempt to  individually debride all these areas.. I changed her to collagen today to see if that would help with any of the surface debris on some of these wounds. Otherwise we will not be able to put this in compression we have to have  someone change the dressing. The patient is not eligible for home health 12/25/17 on evaluation today patient actually appears to be doing rather well in regard to the ulcer on her lower extremity. Fortunately there does not appear to be evidence of infection at this time. She has been tolerating the dressing changes without complication. This includes the compression wrap. The only issue she had was that the wrap was initially placed over her bunion region which actually calls her some discomfort and pain. Other than that things seem to be going rather well. 01/01/2018 Seen today for follow-up and management of left lower extremity wound and lymphedema. T oday she presents with a new wound towards to the left lateral LE. Recently treated with a 7 day course of amoxicillin; reason for antibiotic dose is unknown at this time. Tolerating current treatment of collagen with 4- layer wraps. She obtained a venous reflux study on 12/25/17. Studies show on the right abnormal reflux times of the popliteal vein, great saphenous vein at the saphenofemoral junction at the proximal thigh, great saphenous vein at the mid calf, and origin of the small saphenous vein.No superficial thrombosis. No deep vein thrombosis in the common femoral, femoral,and popliteal veins. Left abnormal reflex times as well of the common femoral vein, popliteal vein, and a great saphenous vein at the saphenofemoral junction, In great saphenous vein at the mid thigh w/o thrombosis. Has any issues or concerns during visit today. Recommended follow-up to vascular specialist due to abnormalities from the venous reflux study. Denies fever, pain, chills, dizziness, nausea, or vomiting. 01/08/18 upon evaluation today the  patient actually seems to be showing some signs of improvement in my opinion at this point in regard to the lower extremity ulcerated areas. She still has a lot of drainage but fortunately nothing that appears to be too significant currently. I have been very happy with the overall progress I see today compared to where things were during the last evaluation that I had with her. Nonetheless she has not had her appointment with the vein specialist as of yet in fact we were able to get this approved for her today and confirmed with them she will be seeing them on December 26. Nonetheless in general I do feel like the compression wraps is doing well for her. 01/17/18; quite a bit of improvement since last time I saw this patient she has a small open area remaining on the left lateral calf and even smaller area medially. She has lymphedema chronic stasis changes with distal skin fibrosis. She has an appointment with vascular surgery later this month 01/24/2018; the patient's medial leg has closed. Still a small open area on the left lateral leg. She states that the 4 layer compression we put on last week was too tight and she had to take it off over a few days ago. We have had resultant increase in her lymphedema in the dorsal foot and a proximal calf. Fortunately that does not seem to have resulted in any deterioration in her wounds The patient is going to need compression stockings. We have given her measurements to phone elastic therapy in [place]. She has vascular surgery consult on December 26 02/07/18; she's had continuous contraction on the left lateral leg wound which is now very small. Currently using silver alginate under 3 layer compression She saw Dr. Trula Slade of vascular surgery on 02/06/18. It was noted that she had a normal reflux times in the popliteal vein, great saphenous vein at the saphenofemoral junction, great  saphenous vein at the proximal thigh great saphenous vein at the mid calf  and origin of the small saphenous vein. It was noted that she had significant reflux in the left saphenous veins with diameter measurements in the 0.7-0.8 cm range. It was felt she would benefit from laser ablation to help minimize the risk of ulcer recurrence. It was recommended that she wear 20-30 thigh-high compression stockings and have follow-up in 4-6 weeks 02/17/2018; I thought this lady would be healed however her compression slipped down and she developed increasing swelling and the wound is actually larger. 1/13; we had deterioration last week after the patient's compression slipped down and she developed periwound swelling. I increased her compression before layers we have been using silver alginate we are a lot better again today. She has her compression stockings in waiting 1/23; the patient's wounds are totally healed today. She has her stockings. This was almost circumferential skin damage. She follows up with Dr. Trula Slade of vascular surgery next Monday. READMISSION 02/21/2021 This is a now 66 year old woman that we had in clinic here discharging in January 2020 with wounds on her left calf chronic venous insufficiency. She was discharged with 30/40 stockings. It does not sound like she has worn stockings in about a year largely from not being able to get them on herself. In November she developed new blisters on her legs an area laterally is opened into a fairly sizable wound. She has weeping posteriorly as well. She has been using Neosporin and Band-Aids. The patient did see Dr. Trula Slade in 2019 and 2020. He felt she might benefit from laser ablation in her left saphenous veins although because of the wound that healed I do not think he went through with it. She might benefit from seeing him again. Her ABI on the left is 1. 1/17; patient's wound on the posterior left calf is closed she has the 2 large areas last week. We put her in 4-layer compression for edema control is a  lot better. Our intake nurse noted greenish drainage and odor. We have been using silver alginate 1/25; PCR culture I did have the substantial wound area on the left lateral lower leg showed staff aureus and group A strep. Low titers of coag negative staph which are probably skin contaminants. Resistance detected to tetracycline methicillin and macrolides. I gave her a starter kit of Nuzyra 150 mg x 3 for 2 days then 300 mg for a further 7 days. Marked odor considerable increase in surrounding erythema. We are using Iodoflex last week however I have changed to silver alginate with underlying Bactroban 2/1; she is completing her [place] tomorrow. The degree of erythema around the wounds looks a lot better. Odor has improved. I gave her silver alginate and Bactroban last week and changing her back to Iodoflex to continue with ongoing debridement 2/8; the periwound looks a lot better. Surface of the wound also looks somewhat better although there is still ongoing debridement to be done we have been using Iodoflex under compression. Primary dressing and silver alginate 2/15; left posterior calf. Some improvement in the surface of the wound but still very gritty we have been using Iodoflex under compression. 04/05/2021: Left lateral posterior calf. She continues to have significant amounts of drainage, some of which is probably comprised of the Iodoflex microbleeds. [name], [name] ([id number]) [id number] Page 3 of 10 There is no odor to the drainage. The satellite lesion on the more posterior aspect of the calf is epithelializing nicely and has  contracted quite a bit. There is robust granulation tissue in the dominant wound with minimal adherent slough. 04/12/2021: The satellite lesion has nearly closed. She continues to have good granulation tissue with minimal slough of the larger primary wound. She continues to have a fair amount of drainage, but I think this is less  secondary to switching her dressing from Iodoflex to Prisma. 04/19/2021: The satellite lesion is almost completely epithelialized. The larger primary wound continues to contract with good granulation tissue and minimal slough. She is currently in [place] with compression. 04/26/2021: The satellite lesion has closed. The larger primary wound has contracted further and the granulation tissue is robust without being hypertrophic. Minimal slough is present. 05/03/2021: The satellite lesion remains closed. The larger primary wound has a bit of slough, but has also contracted further with good perimeter epithelialization. Good granulation tissue at the wound surface. There was some greenish drainage on the dressing when it was removed; it is not the blue-green typically associated with Pseudomonas aeruginosa, however. 05/10/2021: The primary wound continues to contract. There is minimal slough. The granulation tissue at 9:00 is a bit hypertrophic. No significant drainage. No odor. 05/17/2021: The wound is a little bit smaller today with good granulation tissue. Minimal slough. No significant drainage or odor. 05/24/2021: The wound continues to contract and has a nice base of granulation tissue. Small amount of slough. No concern for infection. 05/31/2021: For some reason, the wound measured slightly larger today but overall it still appears to be in good condition with a nice base of granulation tissue and minimal slough. 06/07/2021: The wound is smaller today. Good granulation tissue and minimal slough. 06/14/2021: The wound is unchanged in size. She continues to accumulate some slough. Good granulation tissue on the surface. 06/20/2021: The wound is smaller today. Minimal slough with good granulation tissue. 06/27/2021: The wound on her left lateral leg is smaller today with just a bit of slough and eschar accumulation. Unfortunately, she has opened a new superficial wound on her right lower extremity. She has 3+  pitting edema to the knees on that leg and she does not wear compression stockings. 07/04/2021: In addition to the superficial wound on her right lower extremity that she opened up last week, she has opened 3 additional sites on the same leg. Her compression wraps were clearly not in correct position when she came to clinic today as they were at about the mid calf level rather than to the tibial tuberosity. She says that they slipped earlier and she had to come back on Friday to have them redone. The wound on her left lateral leg is perhaps slightly larger with some slough accumulation. 07/11/2021: The superficial wounds on her right lower extremity are nearly closed. They just have a thin layer of eschar overlying them. The wound on her left lateral leg is a little bit shallower and has a bit of slough accumulation. 07/17/2021: The right medial lower extremity leg wounds have almost completely closed; one of them just has a tiny opening with a little bit of serous drainage. The left lateral leg wound is really unchanged. It seems to be stalled. 07/24/2021: The right leg wounds are completely closed. The left lateral leg wound is actually bigger today. It is a bit more tender. There is a minor accumulation of slough on the surface. 07/31/2021: The culture that I took last week was positive for MRSA. We added mupirocin under the silver alginate and I prescribed doxycycline. She has been tolerating this well. The  wound is smaller today but does have slough accumulation. No significant pain or drainage. 08/07/2021: She completed her course of doxycycline. The wound looks much better today. It is smaller and the periwound is less inflamed. She does have some slough accumulation on the surface. 08/14/2021: The wound continues to contract. There is slough on the wound surface, but the periwound is intact without inflammation or induration. 08/21/2021: The wound is down to just 2 small open sites. They both have a  bit of slough accumulation. 08/28/2021: The wound is down to just 1 small open site with a little bit of slough and eschar accumulation. 09/04/2021: The wound continues to contract but remains open. It is clean without any slough. 09/12/2021: No real change in the overall wound dimensions, but it is flush with the surrounding skin. There is a little bit of slough accumulation. Edema control is good. 09/22/2021: The wound is smaller and even more superficial. Minimal slough accumulation. Good control of edema. 09/29/2021: The wound continues to contract. She reports that it was a little bit "twingey" during the week. It is a little bit dry on inspection today. Light accumulation of slough. Good edema control. 10/06/2021: The wound is about the same size today. There is a little slough on the surface. Edema control is good. 10/13/2021: The wound is about the same size but more superficial. A little bit of slough on the surface. 10/23/2021: The wound is slightly smaller and continues to fill in. Minimal slough on the wound surface. 10/30/2021: The wound continues to contract but has not yet closed. Light slough and eschar present. 11/06/2021: The wound persists, but seems a little bit more epithelialized. There is still slough and eschar accumulation. 11/14/2021: The wound continues to contract but persists. Light eschar around the wound. 11/22/2021: The wound is down to just a pinhole opening. There is eschar overlying the surface. Edema control is excellent. 11/29/2021: Her wound is closed. READMISSION 01/22/2022 This is a patient to be discharged in October. She has severe chronic venous insufficiency at that time she had a wound on her left lower leg posteriorly also the right leg at some point. These closed. We discharged her in 30/40 mm stockings from elastic therapy which she has been wearing religiously. She tells [name] NYARA, CAPELL ([id number]) [id number].pdf Page 4 of 10 that  she was traumatized by a [place] while shopping at [facility] about 2 to 3 weeks ago. She has been left with a left anterior lower leg wound. She comes in in another pair of stockings other than her 3040s. She does not have an arterial issue with her last ABI in the left at 1.2 12/18; this is a patient who has chronic venous insufficiency and recurrent venous insufficiency ulcers. She has a area on her left anterior lower leg and we readmitted her to the clinic last week. We use silver alginate and 4-layer compression. ABI was 1.2 She brought in her stocking which was a 20/30 below-knee stocking from elastic therapy. She may require a 30/40 mm equivalent stockings after this wound heals. Electronic Signature(s) Signed: 01/29/2022 5:06:55 PM By: Linton Ham MD Entered By: Linton Ham on 01/29/2022 10:30:58 -------------------------------------------------------------------------------- Physical Exam Details Patient Name: Date of Service: [name] NITA [name] 01/29/2022 9:30 A M Medical Record Number: [id number] Patient Account Number: [id number] Date of Birth/Sex: Treating RN: [birth year] (66 y.o. F) Primary Care Provider: [name] Other Clinician: Referring Provider: Treating Provider/Extender: Milus Banister in Treatment: 1 Constitutional Patient is hypertensive.. Pulse regular  and within target range for patient.Marland Kitchen Respirations regular, non-labored and within target range.. Temperature is normal and within the target range for the patient.Marland Kitchen Appears in no distress. Notes Wound exam; left anterior lateral lower leg. Under illumination 100% covered with adherent slough and fibrinous debris. I used a #5 curette to debride this. Her edema control is moderate even with 4-layer compression. There is no evidence of infection Electronic Signature(s) Signed: 01/29/2022 5:06:55 PM By: Linton Ham MD Entered By: Linton Ham on 01/29/2022  10:32:26 -------------------------------------------------------------------------------- Physician Orders Details Patient Name: Date of Service: Erin Face [name] 01/29/2022 9:30 A M Medical Record Number: [id number] Patient Account Number: [id number] Date of Birth/Sex: Treating RN: 1955-12-11 (66 y.o. Marta Lamas Primary Care Provider: [name] Other Clinician: Referring Provider: Treating Provider/Extender: Milus Banister in Treatment: 1 Verbal / Phone Orders: No Diagnosis Coding Follow-up Appointments ppointment in 1 week. - Dr. Celine Ahr Room 3 Return A Anesthetic (In clinic) Topical Lidocaine 5% applied to wound bed Bathing/ Shower/ Hygiene May shower with protection but do not get wound dressing(s) wet. - Ok to use Market [employer], can purchase at [facility], [facility], or [employer] Edema Control - Lymphedema / SCD / Other Elevate legs to the level of the heart or above for 30 minutes daily and/or when sitting, a frequency of: - throughout the day Avoid standing for long periods of time. Patient to wear own compression stockings every day. Exercise regularly ALAISHA, EVERSLEY ([id number]) [phone].pdf Page 5 of 10 Wound Treatment Wound #7 - Lower Leg Wound Laterality: Left, Anterior Cleanser: Soap and Water 1 x Per Week/30 Days Discharge Instructions: May shower and wash wound with dial antibacterial soap and water prior to dressing change. Cleanser: Wound Cleanser 1 x Per Week/30 Days Discharge Instructions: Cleanse the wound with wound cleanser prior to applying a clean dressing using gauze sponges, not tissue or cotton balls. Prim Dressing: Sorbalgon AG Dressing 2x2 (in/in) 1 x Per Week/30 Days ary Discharge Instructions: Apply to wound bed as instructed Secondary Dressing: ABD Pad, 5x9 1 x Per Week/30 Days Discharge Instructions: Apply over primary dressing as directed. Secondary Dressing: Woven Gauze Sponge, Non-Sterile  4x4 in 1 x Per Week/30 Days Discharge Instructions: Apply over primary dressing as directed. Secured With: Transpore Surgical Tape, 2x10 (in/yd) 1 x Per Week/30 Days Discharge Instructions: Secure dressing with tape as directed. Compression Wrap: FourPress (4 layer compression wrap) 1 x Per Week/30 Days Discharge Instructions: Apply four layer compression as directed. May also use Miliken CoFlex 2 layer compression system as alternative. Electronic Signature(s) Signed: 01/29/2022 5:06:55 PM By: Linton Ham MD Signed: 01/29/2022 5:09:12 PM By: Blanche East RN Entered By: Blanche East on 01/29/2022 10:04:42 -------------------------------------------------------------------------------- Problem List Details Patient Name: Date of Service: Erin Face [name] 01/29/2022 9:30 A M Medical Record Number: [id number] Patient Account Number: [id number] Date of Birth/Sex: Treating RN: [birth year] (66 y.o. F) Primary Care Provider: [name] Other Clinician: Referring Provider: Treating Provider/Extender: Milus Banister in Treatment: 1 Active Problems ICD-10 Encounter Code Description Active Date MDM Diagnosis 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 01/22/2022 No Yes L97.928 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 with other 01/22/2022 No Yes specified severity Inactive Problems Resolved Problems Electronic Signature(s) Signed: 01/29/2022 5:06:55 PM By: Linton Ham MD Entered By: Linton Ham on 01/29/2022 10:29:41 [name] ([id number]) [id number] Page 6 of 10 -------------------------------------------------------------------------------- Progress Note Details Patient Name: Date of Service: [name], [name] 01/29/2022  9:30 A M Medical Record Number: [id number] Patient Account Number: [id number] Date of Birth/Sex: Treating RN: July 30, 1955 (66  y.o. 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 She tells [name] that she fell on the gravel driveway in July. She developed injuries on her distal lower leg which have not healed. She saw her primary physician on 11/15/2017 who noted her left shin injuries. Gave her antibiotics. At that point the wounds were almost circumferential however most were less than 1.5 cm. Weeping edema fluid was noted. She was referred here for evaluation. The patient has a history of chronic lower extremity edema. 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 She has had prior history with leg weeping fluid. She does not wear compression stockings. She is not doing anything specific to these wound areas. 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 She also has lymphedema. Put her in 3 layer compression and silver alginate last week. She has 3 small wounds with depth just lateral to the tibia. More problematically than this she has numerous shallow areas some of which are almost canal like in shape with tightly adherent painful debris. It would be very difficult and time- consuming to go through this and attempt to individually debride all these areas.. I changed her to collagen today to see if that would help with any of the surface debris on some of these wounds. Otherwise we will not  be able to put this in compression we have to have someone change the dressing. The patient is not eligible for home health 12/25/17 on evaluation today patient actually appears to be doing rather well in regard to the ulcer on her lower extremity. Fortunately there does not appear to be evidence of infection at this time. She has been tolerating the dressing changes without complication. This includes the compression wrap. The only issue she had was that the wrap was initially placed over her bunion region which actually calls her some discomfort and pain. Other than that things seem to be going rather well. 01/01/2018 Seen today for follow-up and management of left lower extremity wound and lymphedema. T oday she presents with a new wound towards to the left lateral LE. Recently treated with a 7 day course of amoxicillin; reason for antibiotic dose is unknown at this time. Tolerating current treatment of collagen with 4- layer wraps. She obtained a venous reflux study on 12/25/17. Studies show on the right abnormal reflux times of the popliteal vein, great saphenous vein at the saphenofemoral junction at the proximal thigh, great saphenous vein at the mid calf, and origin of the small saphenous vein.No superficial thrombosis. No deep vein thrombosis in the common femoral, femoral,and popliteal veins. Left abnormal reflex times as well of the common femoral vein, popliteal vein, and a great saphenous vein at the saphenofemoral junction, In great saphenous vein at the mid thigh w/o thrombosis. Has any issues or concerns during visit today. Recommended follow-up to vascular specialist due to abnormalities  from the venous reflux study. Denies fever, pain, chills, dizziness, nausea, or vomiting. 01/08/18 upon evaluation today the patient actually seems to be showing some signs of improvement in my opinion at this point in regard to the lower extremity ulcerated areas. She still has a lot of drainage but  fortunately nothing that appears to be too significant currently. I have been very happy with the overall progress I see today compared to where things were during the last evaluation that I had with her. Nonetheless she has not had her appointment with the vein specialist as of yet in fact we were able to get this approved for her today and confirmed with them she will be seeing them on December 26. Nonetheless in general I do feel like the compression wraps is doing well for her. 01/17/18; quite a bit of improvement since last time I saw this patient she has a small open area remaining on the left lateral calf and even smaller area medially. She has lymphedema chronic stasis changes with distal skin fibrosis. She has an appointment with vascular surgery later this month 01/24/2018; the patient's medial leg has closed. Still a small open area on the left lateral leg. She states that the 4 layer compression we put on last week was too tight and she had to take it off over a few days ago. We have had resultant increase in her lymphedema in the dorsal foot and a proximal calf. Fortunately that does not seem to have resulted in any deterioration in her wounds The patient is going to need compression stockings. We have given her measurements to phone elastic therapy in [place]. She has vascular surgery consult on December 26 02/07/18; she's had continuous contraction on the left lateral leg wound which is now very small. Currently using silver alginate under 3 layer compression She saw Dr. Trula Slade of vascular surgery on 02/06/18. It was noted that she had a normal reflux times in the popliteal vein, great saphenous vein at the saphenofemoral junction, great saphenous vein at the proximal thigh great saphenous vein at the mid calf and origin of the small saphenous vein. It was noted that she had significant reflux in the left saphenous veins with diameter measurements in the 0.7-0.8 cm range. It was felt  she would benefit from laser ablation to help minimize the risk of ulcer recurrence. It was recommended that she wear 20-30 thigh-high compression stockings and have follow-up in 4-6 weeks 02/17/2018; I thought this lady would be healed however her compression slipped down and she developed increasing swelling and the wound is actually larger. 1/13; we had deterioration last week after the patient's compression slipped down and she developed periwound swelling. I increased her compression before layers we have been using silver alginate we are a lot better again today. She has her compression stockings in waiting 1/23; the patient's wounds are totally healed today. She has her stockings. This was almost circumferential skin damage. She follows up with Dr. Trula Slade of vascular surgery next Monday. READMISSION 02/21/2021 This is a now 66 year old woman that we had in clinic here discharging in January 2020 with wounds on her left calf chronic venous insufficiency. She was discharged with 30/40 stockings. It does not sound like she has worn stockings in about a year largely from not being able to get them on herself. In November she developed new blisters on her legs an area laterally is opened into a fairly sizable wound. She has weeping posteriorly as well. She has been  using Neosporin and Band-Aids. The patient did see Dr. Trula Slade in 2019 and 2020. He felt she might benefit from laser ablation in her left saphenous veins although because of the wound that healed I do not think he went through with it. She might benefit from seeing him again. [name], [name] ([id number]) [id number] Page 7 of 10 Her ABI on the left is 1. 1/17; patient's wound on the posterior left calf is closed she has the 2 large areas last week. We put her in 4-layer compression for edema control is a lot better. Our intake nurse noted greenish drainage and odor. We have been using silver alginate 1/25;  PCR culture I did have the substantial wound area on the left lateral lower leg showed staff aureus and group A strep. Low titers of coag negative staph which are probably skin contaminants. Resistance detected to tetracycline methicillin and macrolides. I gave her a starter kit of Nuzyra 150 mg x 3 for 2 days then 300 mg for a further 7 days. Marked odor considerable increase in surrounding erythema. We are using Iodoflex last week however I have changed to silver alginate with underlying Bactroban 2/1; she is completing her [place] tomorrow. The degree of erythema around the wounds looks a lot better. Odor has improved. I gave her silver alginate and Bactroban last week and changing her back to Iodoflex to continue with ongoing debridement 2/8; the periwound looks a lot better. Surface of the wound also looks somewhat better although there is still ongoing debridement to be done we have been using Iodoflex under compression. Primary dressing and silver alginate 2/15; left posterior calf. Some improvement in the surface of the wound but still very gritty we have been using Iodoflex under compression. 04/05/2021: Left lateral posterior calf. She continues to have significant amounts of drainage, some of which is probably comprised of the Iodoflex microbleeds. There is no odor to the drainage. The satellite lesion on the more posterior aspect of the calf is epithelializing nicely and has contracted quite a bit. There is robust granulation tissue in the dominant wound with minimal adherent slough. 04/12/2021: The satellite lesion has nearly closed. She continues to have good granulation tissue with minimal slough of the larger primary wound. She continues to have a fair amount of drainage, but I think this is less secondary to switching her dressing from Iodoflex to Prisma. 04/19/2021: The satellite lesion is almost completely epithelialized. The larger primary wound continues to contract with good granulation  tissue and minimal slough. She is currently in [place] with compression. 04/26/2021: The satellite lesion has closed. The larger primary wound has contracted further and the granulation tissue is robust without being hypertrophic. Minimal slough is present. 05/03/2021: The satellite lesion remains closed. The larger primary wound has a bit of slough, but has also contracted further with good perimeter epithelialization. Good granulation tissue at the wound surface. There was some greenish drainage on the dressing when it was removed; it is not the blue-green typically associated with Pseudomonas aeruginosa, however. 05/10/2021: The primary wound continues to contract. There is minimal slough. The granulation tissue at 9:00 is a bit hypertrophic. No significant drainage. No odor. 05/17/2021: The wound is a little bit smaller today with good granulation tissue. Minimal slough. No significant drainage or odor. 05/24/2021: The wound continues to contract and has a nice base of granulation tissue. Small amount of slough. No concern for infection. 05/31/2021: For some reason, the wound measured slightly larger today but overall it still appears  to be in good condition with a nice base of granulation tissue and minimal slough. 06/07/2021: The wound is smaller today. Good granulation tissue and minimal slough. 06/14/2021: The wound is unchanged in size. She continues to accumulate some slough. Good granulation tissue on the surface. 06/20/2021: The wound is smaller today. Minimal slough with good granulation tissue. 06/27/2021: The wound on her left lateral leg is smaller today with just a bit of slough and eschar accumulation. Unfortunately, she has opened a new superficial wound on her right lower extremity. She has 3+ pitting edema to the knees on that leg and she does not wear compression stockings. 07/04/2021: In addition to the superficial wound on her right lower extremity that she opened up last week, she has  opened 3 additional sites on the same leg. Her compression wraps were clearly not in correct position when she came to clinic today as they were at about the mid calf level rather than to the tibial tuberosity. She says that they slipped earlier and she had to come back on Friday to have them redone. The wound on her left lateral leg is perhaps slightly larger with some slough accumulation. 07/11/2021: The superficial wounds on her right lower extremity are nearly closed. They just have a thin layer of eschar overlying them. The wound on her left lateral leg is a little bit shallower and has a bit of slough accumulation. 07/17/2021: The right medial lower extremity leg wounds have almost completely closed; one of them just has a tiny opening with a little bit of serous drainage. The left lateral leg wound is really unchanged. It seems to be stalled. 07/24/2021: The right leg wounds are completely closed. The left lateral leg wound is actually bigger today. It is a bit more tender. There is a minor accumulation of slough on the surface. 07/31/2021: The culture that I took last week was positive for MRSA. We added mupirocin under the silver alginate and I prescribed doxycycline. She has been tolerating this well. The wound is smaller today but does have slough accumulation. No significant pain or drainage. 08/07/2021: She completed her course of doxycycline. The wound looks much better today. It is smaller and the periwound is less inflamed. She does have some slough accumulation on the surface. 08/14/2021: The wound continues to contract. There is slough on the wound surface, but the periwound is intact without inflammation or induration. 08/21/2021: The wound is down to just 2 small open sites. They both have a bit of slough accumulation. 08/28/2021: The wound is down to just 1 small open site with a little bit of slough and eschar accumulation. 09/04/2021: The wound continues to contract but remains open. It  is clean without any slough. 09/12/2021: No real change in the overall wound dimensions, but it is flush with the surrounding skin. There is a little bit of slough accumulation. Edema control is good. 09/22/2021: The wound is smaller and even more superficial. Minimal slough accumulation. Good control of edema. 09/29/2021: The wound continues to contract. She reports that it was a little bit "twingey" during the week. It is a little bit dry on inspection today. Light accumulation of slough. Good edema control. 10/06/2021: The wound is about the same size today. There is a little slough on the surface. Edema control is good. [name], [name] ([id number]) [id number] Page 8 of 10 10/13/2021: The wound is about the same size but more superficial. A little bit of slough on the surface. 10/23/2021: The wound is slightly  smaller and continues to fill in. Minimal slough on the wound surface. 10/30/2021: The wound continues to contract but has not yet closed. Light slough and eschar present. 11/06/2021: The wound persists, but seems a little bit more epithelialized. There is still slough and eschar accumulation. 11/14/2021: The wound continues to contract but persists. Light eschar around the wound. 11/22/2021: The wound is down to just a pinhole opening. There is eschar overlying the surface. Edema control is excellent. 11/29/2021: Her wound is closed. READMISSION 01/22/2022 This is a patient to be discharged in October. She has severe chronic venous insufficiency at that time she had a wound on her left lower leg posteriorly also the right leg at some point. These closed. We discharged her in 30/40 mm stockings from elastic therapy which she has been wearing religiously. She tells [name] that she was traumatized by a [place] while shopping at [facility] about 2 to 3 weeks ago. She has been left with a left anterior lower leg wound. She comes in in another pair of stockings other than her 3040s.  She does not have an arterial issue with her last ABI in the left at 1.2 12/18; this is a patient who has chronic venous insufficiency and recurrent venous insufficiency ulcers. She has a area on her left anterior lower leg and we readmitted her to the clinic last week. We use silver alginate and 4-layer compression. ABI was 1.2 She brought in her stocking which was a 20/30 below-knee stocking from elastic therapy. She may require a 30/40 mm equivalent stockings after this wound heals. Objective Constitutional Patient is hypertensive.. Pulse regular and within target range for patient.Marland Kitchen Respirations regular, non-labored and within target range.. Temperature is normal and within the target range for the patient.Marland Kitchen Appears in no distress. Vitals Time Taken: 9:39 AM, Height: 62 in, Weight: 222 lbs, BMI: 40.6, Temperature: 97.9 F, Pulse: 99 bpm, Respiratory Rate: 18 breaths/min, Blood Pressure: 147/84 mmHg. General Notes: Wound exam; left anterior lateral lower leg. Under illumination 100% covered with adherent slough and fibrinous debris. I used a #5 curette to debride this. Her edema control is moderate even with 4-layer compression. There is no evidence of infection Integumentary (Hair, Skin) Wound #7 status is Open. Original cause of wound was Blister. The date acquired was: 01/22/2022. The wound has been in treatment 1 weeks. The wound is located on the Left,Anterior Lower Leg. The wound measures 1.8cm length x 2cm width x 0.2cm depth; 2.827cm^2 area and 0.565cm^3 volume. There is Fat Layer (Subcutaneous Tissue) exposed. There is no tunneling or undermining noted. There is a medium amount of serosanguineous drainage noted. There is large (67-100%) red granulation within the wound bed. There is a small (1-33%) amount of necrotic tissue within the wound bed including Adherent Slough. The periwound skin appearance had no abnormalities noted for texture. The periwound skin appearance exhibited:  Dry/Scaly, Erythema. The surrounding wound skin color is noted with erythema which is circumferential. Assessment Active Problems ICD-10 Chronic venous hypertension (idiopathic) with ulcer of left lower extremity 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 Procedures Wound #7 Pre-procedure diagnosis of Wound #7 is a Lymphedema located on the Left,Anterior Lower Leg . There was a Excisional Skin/Subcutaneous Tissue Debridement with a total area of 3.6 sq cm performed by Ricard Dillon., MD. With the following instrument(s): Curette to remove Non-Viable tissue/material. Material removed includes Subcutaneous Tissue and Slough and after achieving pain control using Lidocaine 5% topical ointment. No specimens were  taken. A time out was conducted at 10:01, prior to the start of the procedure. A Minimum amount of bleeding was controlled with Pressure. The procedure was tolerated well with a pain level of 0 throughout and a pain level of 0 following the procedure. Post Debridement Measurements: 1.8cm length x 2cm width x 0.2cm depth; 0.565cm^3 volume. Character of Wound/Ulcer Post Debridement requires further debridement. Post procedure Diagnosis Wound #7: Same as Pre-Procedure General Notes: Scribed for Dr. Dellia Nims by Blanche East, RN. Pre-procedure diagnosis of Wound #7 is a Lymphedema located on the Left,Anterior Lower Leg . There was a Four Layer Compression Therapy Procedure with [name], [name] ([id number]) [phone].pdf Page 9 of 10 a pre-treatment ABI of 1.2 by Blanche East, RN. Post procedure Diagnosis Wound #7: Same as Pre-Procedure Plan Follow-up Appointments: Return Appointment in 1 week. - Dr. Celine Ahr Room 3 Anesthetic: (In clinic) Topical Lidocaine 5% applied to wound bed Bathing/ Shower/ Hygiene: May shower with protection but do not get wound dressing(s) wet. - Ok to use [employer], can purchase  at [facility], [facility], or [employer] Edema Control - Lymphedema / SCD / Other: Elevate legs to the level of the heart or above for 30 minutes daily and/or when sitting, a frequency of: - throughout the day Avoid standing for long periods of time. Patient to wear own compression stockings every day. Exercise regularly WOUND #7: - Lower Leg Wound Laterality: Left, Anterior Cleanser: Soap and Water 1 x Per Week/30 Days Discharge Instructions: May shower and wash wound with dial antibacterial soap and water prior to dressing change. Cleanser: Wound Cleanser 1 x Per Week/30 Days Discharge Instructions: Cleanse the wound with wound cleanser prior to applying a clean dressing using gauze sponges, not tissue or cotton balls. Prim Dressing: Sorbalgon [employer] Dressing 2x2 (in/in) 1 x Per Week/30 Days ary Discharge Instructions: Apply to wound bed as instructed Secondary Dressing: ABD Pad, 5x9 1 x Per Week/30 Days Discharge Instructions: Apply over primary dressing as directed. Secondary Dressing: Woven Gauze Sponge, Non-Sterile 4x4 in 1 x Per Week/30 Days Discharge Instructions: Apply over primary dressing as directed. Secured With: Transpore Surgical T ape, 2x10 (in/yd) 1 x Per Week/30 Days Discharge Instructions: Secure dressing with tape as directed. Com pression Wrap: FourPress (4 layer compression wrap) 1 x Per Week/30 Days Discharge Instructions: Apply four layer compression as directed. May also use Miliken CoFlex 2 layer compression system as alternative. 1. Postdebridement hemostasis with direct pressure. 2. I am going to change to Iodoflex under 4-layer compression to help with ongoing debridement of the wound surface otherwise she may require more mechanical debridement. 3. Looking at her leg under 4-layer compression makes me think that she is likely going to need more aggressive compression stockings or external compression stockings at 30-40 once this wound heals Electronic Signature(s) Signed:  01/29/2022 5:06:55 PM By: Linton Ham MD Entered By: Linton Ham on 01/29/2022 10:33:23 -------------------------------------------------------------------------------- SuperBill Details Patient Name: Date of Service: Erin Face [name] 01/29/2022 Medical Record Number: [id number] Patient Account Number: [id number] Date of Birth/Sex: Treating RN: 16-Jul-1955 (66 y.o. F) Primary Care Provider: [name] Other Clinician: Referring Provider: Treating Provider/Extender: Milus Banister in Treatment: 1 Diagnosis Coding ICD-10 Codes Code Description [phone] Chronic venous hypertension (idiopathic) with ulcer of left lower extremity L97.928 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 Facility Procedures : REEVA, DAVERN Code: [id number] 1 Rosie [name] ([id number] ICD- L Description: 8756 - DEB SUBQ TISSUE 20 SQ CM/< ICD-10 Diagnosis  Description 16) [phone] 10 Diagnosis Description 97.928 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 with other specif Modifier: [id number]. ied severity Quantity: 1 pdf Page 10 of 10 Physician Procedures : CPT4 Code Description Modifier [id number] - WC PHYS SUBQ TISS 20 SQ CM ICD-10 Diagnosis Description L97.928 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 Quantity: 1 Electronic Signature(s) Signed: 01/29/2022 5:06:55 PM By: Linton Ham MD Entered By: Linton Ham on 01/29/2022 10:33:34

## 2022-01-31 ENCOUNTER — Other Ambulatory Visit (HOSPITAL_COMMUNITY): Payer: Self-pay

## 2022-02-06 ENCOUNTER — Encounter (HOSPITAL_BASED_OUTPATIENT_CLINIC_OR_DEPARTMENT_OTHER): Payer: 59 | Admitting: General Surgery

## 2022-02-06 DIAGNOSIS — L97822 Non-pressure chronic ulcer of other part of left lower leg with fat layer exposed: Secondary | ICD-10-CM | POA: Diagnosis not present

## 2022-02-06 DIAGNOSIS — L97928 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: Secondary | ICD-10-CM | POA: Diagnosis not present

## 2022-02-06 DIAGNOSIS — I872 Venous insufficiency (chronic) (peripheral): Secondary | ICD-10-CM | POA: Diagnosis not present

## 2022-02-06 DIAGNOSIS — I890 Lymphedema, not elsewhere classified: Secondary | ICD-10-CM | POA: Diagnosis not present

## 2022-02-06 DIAGNOSIS — I10 Essential (primary) hypertension: Secondary | ICD-10-CM | POA: Diagnosis not present

## 2022-02-06 DIAGNOSIS — I739 Peripheral vascular disease, unspecified: Secondary | ICD-10-CM | POA: Diagnosis not present

## 2022-02-06 DIAGNOSIS — E039 Hypothyroidism, unspecified: Secondary | ICD-10-CM | POA: Diagnosis not present

## 2022-02-06 DIAGNOSIS — M1990 Unspecified osteoarthritis, unspecified site: Secondary | ICD-10-CM | POA: Diagnosis not present

## 2022-02-06 NOTE — Progress Notes (Signed)
[name] ([id number]) [phone].pdf Page 1 of 7 Visit Report for 02/06/2022 Arrival Information Details Patient Name: Date of Service: [name], [name] 02/06/2022 11:00 A M Medical Record Number: [id number] Patient Account Number: [id number] Date of Birth/Sex: Treating RN: [birth year] (65 y.o. [name] Primary Care [name]: [name] Other Clinician: Referring [name]: Treating [name]/Extender: [name] in Treatment: 2 Visit Information History Since Last Visit Added or deleted any medications: No Patient Arrived: [name] Any new allergies or adverse reactions: No Arrival Time: 11:30 Had a fall or experienced change in No Accompanied By: self activities of daily living that may affect Transfer Assistance: None risk of falls: Patient Identification Verified: Yes Signs or symptoms of abuse/neglect since last visito No Secondary Verification Process Completed: Yes Hospitalized since last visit: No Implantable device outside of the clinic excluding No cellular tissue based products placed in the center since last visit: Has Dressing in Place as Prescribed: Yes Has Compression in Place as Prescribed: Yes Pain Present Now: Yes Electronic Signature(s) Signed: 02/06/2022 4:17:15 PM By: [name] Entered By: [name] on 02/06/2022 11:33:51 -------------------------------------------------------------------------------- Compression Therapy Details Patient Name: Date of Service: Erin Face [name] 02/06/2022 11:00 A M Medical Record Number: [id number] Patient Account Number: [id number] Date of Birth/Sex: Treating RN: [birth year] (66 y.o. [name] Primary Care [name]: [name] Other Clinician: Referring [name]: Treating [name]/Extender: [name] in Treatment: 2 Compression Therapy Performed for Wound Assessment: Wound #7  Left,Anterior Lower Leg Performed By: Clinician Erin Peals, RN Compression Type: Four Layer Post Procedure Diagnosis Same as Pre-procedure Electronic Signature(s) Signed: 02/06/2022 4:17:15 PM By: [name] Entered By: [name] on 02/06/2022 11:47:56 [name], [name] ([id number]) [id number] Page 2 of 7 -------------------------------------------------------------------------------- Encounter Discharge Information Details Patient Name: Date of Service: [name] 02/06/2022 11:00 A M Medical Record Number: [id number] Patient Account Number: [id number] Date of Birth/Sex: Treating RN: May 20, 1955 (66 y.o. [name] Primary Care [name]: [name] Other Clinician: Referring [name]: Treating [name]/Extender: [name] in Treatment: 2 Encounter Discharge Information Items Post Procedure Vitals Discharge Condition: Stable Temperature (F): 98.2 Ambulatory Status: Cane Pulse (bpm): 108 Discharge Destination: Home Respiratory Rate (breaths/min): 18 Transportation: Private Auto Blood Pressure (mmHg): 162/80 Accompanied By: self Schedule Follow-up Appointment: Yes Clinical Summary of Care: Patient Declined Electronic Signature(s) Signed: 02/06/2022 4:17:15 PM By: [name] Entered By: [name] on 02/06/2022 11:59:08 -------------------------------------------------------------------------------- Lower Extremity Assessment Details Patient Name: Date of Service: [name] 02/06/2022 11:00 A M Medical Record Number: [id number] Patient Account Number: [id number] Date of Birth/Sex: Treating RN: November 08, 1955 (66 y.o. [name] Primary Care [name]: [name] Other Clinician: Referring [name]: Treating [name]/Extender: [name] in Treatment: 2 Edema Assessment Assessed: [Left: No] [Right: No] [Left:  Edema] [Right: :] Calf Left: Right: Point of Measurement: From Medial Instep 42.3 cm Ankle Left: Right: Point of Measurement: From Medial Instep 22 cm Vascular Assessment Pulses: Dorsalis Pedis Palpable: [Left:Yes] Electronic Signature(s) Signed: 02/06/2022 4:17:15 PM By: [name] Entered By: [name] on 02/06/2022 11:40:42 Multi Wound Chart Details -------------------------------------------------------------------------------- [name] ([id number]) [id number] Page 3 of 7 Patient Name: Date of Service: [name], [name] 02/06/2022 11:00 A M Medical Record Number: [id number] Patient Account Number: [id number] Date of Birth/Sex: Treating RN: [birth year] (66 y.o. F) Primary Care [name]: [name] Other Clinician: Referring [name]: Treating [name]/Extender: [name] in Treatment: 2 Vital Signs Height(in): 62 Pulse(bpm): 108  Weight(lbs): 222 Blood Pressure(mmHg): 162/80 Body Mass Index(BMI): 40.6 Temperature(F): 98.2 Respiratory Rate(breaths/min): 18 Wound Assessments Wound Number: 7 N/A N/A Photos: N/A N/A Left, Anterior Lower Leg N/A N/A Wound Location: Blister N/A N/A Wounding Event: Lymphedema N/A N/A Primary Etiology: Cataracts, Lymphedema, N/A N/A Comorbid History: Hypertension, Peripheral Venous Disease, Osteoarthritis 01/22/2022 N/A N/A Date Acquired: 2 N/A N/A Weeks of Treatment: Open N/A N/A Wound Status: No N/A N/A Wound Recurrence: 1.9x1.9x0.2 N/A N/A Measurements L x W x [name] 2.835 N/A N/A A (cm) : rea 0.567 N/A N/A Volume (cm) : 8.80% N/A N/A % Reduction in A rea: 8.80% N/A N/A % Reduction in Volume: Full Thickness Without Exposed N/A N/A Classification: Support Structures Medium N/A N/A Exudate A mount: Serosanguineous N/A N/A Exudate Type: red, brown N/A N/A Exudate Color: Distinct, outline attached N/A N/A Wound Margin: Small  (1-33%) N/A N/A Granulation A mount: Red N/A N/A Granulation Quality: Large (67-100%) N/A N/A Necrotic A mount: Fat Layer (Subcutaneous Tissue): Yes N/A N/A Exposed Structures: Fascia: No Tendon: No Muscle: No Joint: No Bone: No Small (1-33%) N/A N/A Epithelialization: Debridement - Excisional N/A N/A Debridement: Pre-procedure Verification/Time Out 11:45 N/A N/A Taken: Lidocaine 4% Topical Solution N/A N/A Pain Control: Subcutaneous, Slough N/A N/A Tissue Debrided: Skin/Subcutaneous Tissue N/A N/A Level: 3.61 N/A N/A Debridement A (sq cm): rea Curette N/A N/A Instrument: Minimum N/A N/A Bleeding: Pressure N/A N/A Hemostasis A chieved: Procedure was tolerated well N/A N/A Debridement Treatment Response: 1.9x1.9x0.2 N/A N/A Post Debridement Measurements L x W x [name] 0.567 N/A N/A Post Debridement Volume: (cm) No Abnormalities Noted N/A N/A Periwound Skin Texture: Dry/Scaly: Yes N/A N/A Periwound Skin Moisture: Erythema: Yes N/A N/A Periwound Skin Color: Circumferential N/A N/A Erythema Location: Compression Therapy N/A N/A Procedures Performed: Debridement Treatment Notes ALSHA, MELAND ([id number]) [id number] Page 4 of 7 Electronic Signature(s) Signed: 02/06/2022 11:50:16 AM By: [name] MD FACS Entered By: [name] on 02/06/2022 11:50:16 -------------------------------------------------------------------------------- Multi-Disciplinary Care Plan Details Patient Name: Date of Service: Erin Face [name] 02/06/2022 11:00 A M Medical Record Number: [id number] Patient Account Number: [id number] Date of Birth/Sex: Treating RN: 12-Aug-1955 (66 y.o. [name] Primary Care Elijah Phommachanh: [name] Other Clinician: Referring Gearl Kimbrough: Treating Kalyna Paolella/Extender: [name] in Treatment: 2 Active Inactive Abuse / Safety / Falls / Self Care Management Nursing  Diagnoses: History of Falls Impaired physical mobility Goals: Patient/caregiver will identify factors that restrict self-care and home management Date Initiated: 01/22/2022 Target Resolution Date: 03/23/2022 Goal Status: Active Interventions: Assess fall risk on admission and as needed Assess: immobility, friction, shearing, incontinence upon admission and as needed Notes: Wound/Skin Impairment Nursing Diagnoses: Impaired tissue integrity Knowledge deficit related to ulceration/compromised skin integrity Goals: Patient/caregiver will verbalize understanding of skin care regimen Date Initiated: 01/22/2022 Target Resolution Date: 03/23/2022 Goal Status: Active Interventions: Assess ulceration(s) every visit Treatment Activities: Skin care regimen initiated : 01/22/2022 Topical wound management initiated : 01/22/2022 Notes: Electronic Signature(s) Signed: 02/06/2022 4:17:15 PM By: [name] Entered By: [name] on 02/06/2022 11:43:33 Pain Assessment Details -------------------------------------------------------------------------------- [name] ([id number]) [id number] Page 5 of 7 Patient Name: Date of Service: [name], [name] 02/06/2022 11:00 A M Medical Record Number: [id number] Patient Account Number: [id number] Date of Birth/Sex: Treating RN: 1956/01/13 (66 y.o. [name] Primary Care Alima Naser: [name] Other Clinician: Referring Chaia Ikard: Treating Liyah Higham/Extender: [name] in Treatment: 2 Active Problems Location of Pain Severity and Description of Pain Patient Has Paino Yes Site Locations  Duration of the Pain. Constant / Intermittento Intermittent Rate the pain. Current Pain Level: 5 Pain Management and Medication Current Pain Management: Electronic Signature(s) Signed: 02/06/2022 4:17:15 PM By: [name] Entered By: [name] on 02/06/2022  11:34:07 -------------------------------------------------------------------------------- Patient/Caregiver Education Details Patient Name: Date of Service: [name], Erin NITA [name] 12/26/2023andnbsp11:00 A M Medical Record Number: [id number] Patient Account Number: [id number] Date of Birth/Gender: Treating RN: 1955/05/04 (66 y.o. [name] Primary Care Physician: [name] Other Clinician: Referring Physician: Treating Physician/Extender: [name] in Treatment: 2 Education Assessment Education Provided To: Patient Education Topics Provided Safety: Methods: Explain/Verbal Responses: Reinforcements needed, State content correctly Electronic Signature(s) Signed: 02/06/2022 4:17:15 PM By: [name] Entered By: [name] on 02/06/2022 11:43:45 [name] ([id number]) [id number] Page 6 of 7 -------------------------------------------------------------------------------- Wound Assessment Details Patient Name: Date of Service: [name], [name] 02/06/2022 11:00 A M Medical Record Number: [id number] Patient Account Number: [id number] Date of Birth/Sex: Treating RN: Feb 16, 1955 (66 y.o. [name] Primary Care Lilyian Quayle: [name] Other Clinician: Referring Jhalen Eley: Treating Logyn Dedominicis/Extender: [name] in Treatment: 2 Wound Status Wound Number: 7 Primary Lymphedema Etiology: Wound Location: Left, Anterior Lower Leg Wound Open Wounding Event: Blister Status: Date Acquired: 01/22/2022 Comorbid Cataracts, Lymphedema, Hypertension, Peripheral Venous Weeks Of Treatment: 2 History: Disease, Osteoarthritis Clustered Wound: No Photos Wound Measurements Length: (cm) 1.9 Width: (cm) 1.9 Depth: (cm) 0.2 Area: (cm) 2.835 Volume: (cm) 0.567 % Reduction in Area: 8.8% % Reduction in Volume: 8.8% Epithelialization: Small (1-33%) Tunneling:  No Undermining: No Wound Description Classification: Full Thickness Without Exposed Suppor Wound Margin: Distinct, outline attached Exudate Amount: Medium Exudate Type: Serosanguineous Exudate Color: red, brown t Structures Foul Odor After Cleansing: No Slough/Fibrino Yes Wound Bed Granulation Amount: Small (1-33%) Exposed Structure Granulation Quality: Red Fascia Exposed: No Necrotic Amount: Large (67-100%) Fat Layer (Subcutaneous Tissue) Exposed: Yes Necrotic Quality: Adherent Slough Tendon Exposed: No Muscle Exposed: No Joint Exposed: No Bone Exposed: No Periwound Skin Texture Texture Color No Abnormalities Noted: Yes No Abnormalities Noted: No Erythema: Yes Moisture Erythema Location: Circumferential No Abnormalities Noted: No Dry / Scaly: Yes Treatment Notes Wound #7 (Lower Leg) Wound Laterality: Left, Anterior Cleanser Soap and [address] [name], [name] ([id number]) [phone].pdf Page 7 of 7 Discharge Instruction: May shower and wash wound with dial antibacterial soap and water prior to dressing change. Wound Cleanser Discharge Instruction: Cleanse the wound with wound cleanser prior to applying a clean dressing using gauze sponges, not tissue or cotton balls. Peri-Wound Care Topical Primary Dressing IODOFLEX 0.9% Cadexomer Iodine Pad 4x6 cm Discharge Instruction: Apply to wound bed as instructed Secondary Dressing ABD Pad, 5x9 Discharge Instruction: Apply over primary dressing as directed. Woven Gauze Sponge, Non-Sterile 4x4 in Discharge Instruction: Apply over primary dressing as directed. Secured With Transpore Surgical Tape, 2x10 (in/yd) Discharge Instruction: Secure dressing with tape as directed. Compression Wrap FourPress (4 layer compression wrap) Discharge Instruction: Apply four layer compression as directed. May also use Miliken CoFlex 2 layer compression system as alternative. Compression Stockings Add-Ons Electronic  Signature(s) Signed: 02/06/2022 4:17:15 PM By: [name] Entered By: [name] on 02/06/2022 11:42:31 -------------------------------------------------------------------------------- Vitals Details Patient Name: Date of Service: Erin Face [name] 02/06/2022 11:00 A M Medical Record Number: [id number] Patient Account Number: [id number] Date of Birth/Sex: Treating RN: 17-Mar-1955 (66 y.o. [name] Primary Care Skarlette Lattner: [name] Other Clinician: Referring Gailya Tauer: Treating Lamond Glantz/Extender: [name] in Treatment: 2 Vital Signs Time Taken: 11:34 Temperature (F): 98.2 Height (  in): 62 Pulse (bpm): 108 Weight (lbs): 222 Respiratory Rate (breaths/min): 18 Body Mass Index (BMI): 40.6 Blood Pressure (mmHg): 162/80 Reference Range: 80 - 120 mg / dl Electronic Signature(s) Signed: 02/06/2022 4:17:15 PM By: [name] Entered By: [name] on 02/06/2022 11:35:39

## 2022-02-06 NOTE — Progress Notes (Signed)
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 History: Past Medical History Notes: hypothyroidism Integumentary (Skin) Medical History: Negative for: History of Burn Musculoskeletal Medical History: Positive for: Osteoarthritis HBO Extended History Items Eyes: Cataracts Immunizations Pneumococcal Vaccine: Received Pneumococcal Vaccination: Yes Received Pneumococcal Vaccination On or After 60th Birthday: Yes Implantable Devices None Hospitalization / Surgery History Type of Hospitalization/Surgery colostomy reversal 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 Family and Social History Cancer: No; Diabetes: No; Heart Disease: Yes - Mother,Father; Hereditary Spherocytosis: No; Hypertension: Yes - Mother,Father; Kidney Disease: Yes - Mother; Lung Disease: No; Seizures: No; Stroke: No; Thyroid Problems: Yes - Mother; Tuberculosis: No; Never smoker; Marital Status - Single; Alcohol Use: Never; Drug Use: No History; Caffeine Use: Daily - coffee; Financial Concerns: No; Food, Clothing or Shelter Needs: No; Support System Lacking: No; Transportation Concerns: No Electronic Signature(s) Signed: 02/06/2022 1:20:52 PM By: [name] MD FACS Entered By: [name] on 02/06/2022 11:52:37 RAKIA, FRAYNE [name] ([id number]) [id number] Page 12 of 12 -------------------------------------------------------------------------------- SuperBill Details Patient Name: Date of Service: ALIZAYA, OSHEA 02/06/2022 Medical Record Number: [id number] Patient Account Number: [id number] Date of Birth/Sex: Treating RN: 1955-04-18 (66 y.o. 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 with other spec Modifier: ified severity Quantity: 1 Physician Procedures : CPT4 Code Description Modifier [id number] - WC PHYS LEVEL 3 - EST PT 25 ICD-10 Diagnosis Description L97.928 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

## 2022-02-07 ENCOUNTER — Encounter (HOSPITAL_BASED_OUTPATIENT_CLINIC_OR_DEPARTMENT_OTHER): Payer: 59 | Admitting: General Surgery

## 2022-02-13 ENCOUNTER — Encounter (HOSPITAL_BASED_OUTPATIENT_CLINIC_OR_DEPARTMENT_OTHER): Payer: Commercial Managed Care - PPO | Attending: General Surgery | Admitting: General Surgery

## 2022-02-13 ENCOUNTER — Other Ambulatory Visit: Payer: Self-pay

## 2022-02-13 DIAGNOSIS — I739 Peripheral vascular disease, unspecified: Secondary | ICD-10-CM | POA: Insufficient documentation

## 2022-02-13 DIAGNOSIS — E039 Hypothyroidism, unspecified: Secondary | ICD-10-CM | POA: Diagnosis not present

## 2022-02-13 DIAGNOSIS — I890 Lymphedema, not elsewhere classified: Secondary | ICD-10-CM | POA: Diagnosis not present

## 2022-02-13 DIAGNOSIS — I872 Venous insufficiency (chronic) (peripheral): Secondary | ICD-10-CM | POA: Insufficient documentation

## 2022-02-13 DIAGNOSIS — L97928 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: Secondary | ICD-10-CM | POA: Insufficient documentation

## 2022-02-13 NOTE — Progress Notes (Signed)
DHANI, IMEL ([id number]) [id number] Page 1 of 7 Visit Report for 02/13/2022 Arrival Information Details Patient Name: Date of Service: [name], [name] 02/13/2022 10:45 A M Medical Record Number: [id number] Patient Account Number: [id number] Date of Birth/Sex: Treating RN: 01/10/1956 (67 y.o. [name] Primary Care Jakai Onofre: [name] Other Clinician: Referring Wilman Tucker: Treating Sheniece Ruggles/Extender: [name] in Treatment: 3 Visit Information History Since Last Visit Added or deleted any medications: No Patient Arrived: [name] Any new allergies or adverse reactions: No Arrival Time: 11:11 Had a fall or experienced change in No Accompanied By: self activities of daily living that may affect Transfer Assistance: None risk of falls: Patient Identification Verified: Yes Signs or symptoms of abuse/neglect since last visito No Secondary Verification Process Completed: Yes Hospitalized since last visit: No Implantable device outside of the clinic excluding No cellular tissue based products placed in the center since last visit: Has Dressing in Place as Prescribed: Yes Has Compression in Place as Prescribed: Yes Pain Present Now: No Electronic Signature(s) Signed: 02/13/2022 3:22:54 PM By: Adline Peals Entered By: Adline Peals on 02/13/2022 11:15:58 -------------------------------------------------------------------------------- Compression Therapy Details Patient Name: Date of Service: Erin Face [name] 02/13/2022 10:45 A M Medical Record Number: [id number] Patient Account Number: [id number] Date of Birth/Sex: Treating RN: 1955/05/09 (67 y.o. [name] Primary Care Donneisha Beane: [name] Other Clinician: Referring Yaniris Braddock: Treating Marcelia Petersen/Extender: [name] in Treatment: 3 Compression Therapy Performed for Wound Assessment: Wound #7 Left,Anterior Lower  Leg Performed By: Clinician Adline Peals, RN Compression Type: Four Layer Post Procedure Diagnosis Same as Pre-procedure Electronic Signature(s) Signed: 02/13/2022 3:22:54 PM By: Adline Peals Entered By: Adline Peals on 02/13/2022 11:29:37 [name], [name] ([id number]) [id number] Page 2 of 7 -------------------------------------------------------------------------------- Encounter Discharge Information Details Patient Name: Date of Service: ANAGABRIELA, JOKERST 02/13/2022 10:45 A M Medical Record Number: [id number] Patient Account Number: [id number] Date of Birth/Sex: Treating RN: January 25, 1956 (67 y.o. [name] Primary Care Tayelor Osborne: [name] Other Clinician: Referring Lexxi Koslow: Treating Neyra Pettie/Extender: [name] in Treatment: 3 Encounter Discharge Information Items Post Procedure Vitals Discharge Condition: Stable Temperature (F): 97.9 Ambulatory Status: Cane Pulse (bpm): 85 Discharge Destination: Home Respiratory Rate (breaths/min): 20 Transportation: Private Auto Blood Pressure (mmHg): 148/83 Accompanied By: self Schedule Follow-up Appointment: Yes Clinical Summary of Care: Patient Declined Electronic Signature(s) Signed: 02/13/2022 3:22:54 PM By: Adline Peals Entered By: Adline Peals on 02/13/2022 11:43:17 -------------------------------------------------------------------------------- Lower Extremity Assessment Details Patient Name: Date of Service: [name], [name] 02/13/2022 10:45 A M Medical Record Number: [id number] Patient Account Number: [id number] Date of Birth/Sex: Treating RN: 01/01/1956 (67 y.o. [name] Primary Care Ignatz Deis: [name] Other Clinician: Referring Adoni Greenough: Treating Trichelle Lehan/Extender: [name] in Treatment: 3 Edema Assessment Assessed: [Left: No] [Right: No] [Left: Edema] [Right: :] Calf Left:  Right: Point of Measurement: From Medial Instep 42 cm Ankle Left: Right: Point of Measurement: From Medial Instep 21.2 cm Vascular Assessment Pulses: Dorsalis Pedis Palpable: [Left:Yes] Electronic Signature(s) Signed: 02/13/2022 3:22:54 PM By: Adline Peals Entered By: Adline Peals on 02/13/2022 11:22:16 Multi Wound Chart Details -------------------------------------------------------------------------------- [name] ([id number]) [id number] Page 3 of 7 Patient Name: Date of Service: [name], [name] 02/13/2022 10:45 A M Medical Record Number: [id number] Patient Account Number: [id number] Date of Birth/Sex: Treating RN: [birth year] (67 y.o. F) Primary Care Kelena Garrow: [name] Other Clinician: Referring Philicia Heyne: Treating Taraoluwa Thakur/Extender: [name] in Treatment: 3 Vital Signs Height(in): 62 Pulse(bpm): 33  Weight(lbs): 222 Blood Pressure(mmHg): 148/83 Body Mass Index(BMI): 40.6 Temperature(F): 97.9 Respiratory Rate(breaths/min): 20 Wound Assessments Wound Number: 7 N/A N/A Photos: N/A N/A Left, Anterior Lower Leg N/A N/A Wound Location: Blister N/A N/A Wounding Event: Lymphedema N/A N/A Primary Etiology: Cataracts, Lymphedema, N/A N/A Comorbid History: Hypertension, Peripheral Venous Disease, Osteoarthritis 01/22/2022 N/A N/A Date Acquired: 3 N/A N/A Weeks of Treatment: Open N/A N/A Wound Status: No N/A N/A Wound Recurrence: 2.1x2.4x0.2 N/A N/A Measurements L x W x [name] 3.958 N/A N/A A (cm) : rea 0.792 N/A N/A Volume (cm) : -27.30% N/A N/A % Reduction in A rea: -27.30% N/A N/A % Reduction in Volume: Full Thickness Without Exposed N/A N/A Classification: Support Structures Medium N/A N/A Exudate A mount: Serosanguineous N/A N/A Exudate Type: red, brown N/A N/A Exudate Color: Distinct, outline attached N/A N/A Wound Margin: Medium (34-66%) N/A N/A Granulation A  mount: Red N/A N/A Granulation Quality: Medium (34-66%) N/A N/A Necrotic A mount: Fat Layer (Subcutaneous Tissue): Yes N/A N/A Exposed Structures: Fascia: No Tendon: No Muscle: No Joint: No Bone: No Small (1-33%) N/A N/A Epithelialization: Debridement - Excisional N/A N/A Debridement: Pre-procedure Verification/Time Out 11:27 N/A N/A Taken: Lidocaine 5% topical ointment N/A N/A Pain Control: Necrotic/Eschar, Subcutaneous, N/A N/A Tissue Debrided: Slough Skin/Subcutaneous Tissue N/A N/A Level: 5.04 N/A N/A Debridement A (sq cm): rea Curette N/A N/A Instrument: Minimum N/A N/A Bleeding: Pressure N/A N/A Hemostasis Achieved: Debridement Treatment Response: Procedure was tolerated well N/A N/A Post Debridement Measurements L x 2.1x2.4x0.2 N/A N/A W x [name] 0.792 N/A N/A Post Debridement Volume: (cm) No Abnormalities Noted N/A N/A Periwound Skin Texture: Dry/Scaly: Yes N/A N/A Periwound Skin Moisture: Erythema: Yes N/A N/A Periwound Skin Color: Circumferential N/A N/A Erythema Location: No Abnormality N/A N/A Temperature: Compression Therapy N/A N/A Procedures Performed: Debridement Treatment Notes TAKYAH, CIARAMITARO ([id number]) [id number] Page 4 of 7 Electronic Signature(s) Signed: 02/13/2022 11:31:08 AM By: [name] MD FACS Entered By: [name] on 02/13/2022 11:31:07 -------------------------------------------------------------------------------- Multi-Disciplinary Care Plan Details Patient Name: Date of Service: Erin Face [name] 02/13/2022 10:45 A M Medical Record Number: [id number] Patient Account Number: [id number] Date of Birth/Sex: Treating RN: [birth year] (67 y.o. [name] Primary Care Lashia Niese: [name] Other Clinician: Referring Clois Treanor: Treating Connor Meacham/Extender: [name] in Treatment: 3 Active Inactive Abuse / Safety / Falls / Self Care  Management Nursing Diagnoses: History of Falls Impaired physical mobility Goals: Patient/caregiver will identify factors that restrict self-care and home management Date Initiated: 01/22/2022 Target Resolution Date: 03/23/2022 Goal Status: Active Interventions: Assess fall risk on admission and as needed Assess: immobility, friction, shearing, incontinence upon admission and as needed Notes: Wound/Skin Impairment Nursing Diagnoses: Impaired tissue integrity Knowledge deficit related to ulceration/compromised skin integrity Goals: Patient/caregiver will verbalize understanding of skin care regimen Date Initiated: 01/22/2022 Target Resolution Date: 03/23/2022 Goal Status: Active Interventions: Assess ulceration(s) every visit Treatment Activities: Skin care regimen initiated : 01/22/2022 Topical wound management initiated : 01/22/2022 Notes: Electronic Signature(s) Signed: 02/13/2022 3:22:54 PM By: Adline Peals Entered By: Adline Peals on 02/13/2022 11:27:13 [name] ([id number]) [id number] Page 5 of 7 -------------------------------------------------------------------------------- Pain Assessment Details Patient Name: Date of Service: [name], [name] 02/13/2022 10:45 A M Medical Record Number: [id number] Patient Account Number: [id number] Date of Birth/Sex: Treating RN: 1955/09/05 (67 y.o. [name] Primary Care Jamari Diana: [name] Other Clinician: Referring Analyn Matusek: Treating Aunesty Tyson/Extender: [name] in Treatment: 3 Active Problems Location of Pain Severity and Description of Pain Patient  Has Paino No Site Locations Rate the pain. Current Pain Level: 0 Pain Management and Medication Current Pain Management: Electronic Signature(s) Signed: 02/13/2022 3:22:54 PM By: Adline Peals Entered By: Adline Peals on 02/13/2022  11:16:51 -------------------------------------------------------------------------------- Patient/Caregiver Education Details Patient Name: Date of Service: [name] 1/2/2024andnbsp10:45 [place] Record Number: [id number] Patient Account Number: [id number] Date of Birth/Gender: Treating RN: Jun 29, 1955 (67 y.o. [name] Primary Care Physician: [name] Other Clinician: Referring Physician: Treating Physician/Extender: [name] in Treatment: 3 Education Assessment Education Provided To: Patient Education Topics Provided Safety: Methods: Explain/Verbal Responses: Reinforcements needed, State content correctly Electronic Signature(s) Signed: 02/13/2022 3:22:54 PM By: Adline Peals Entered By: Adline Peals on 02/13/2022 11:27:29 [name] ([id number]) [id number] Page 6 of 7 -------------------------------------------------------------------------------- Wound Assessment Details Patient Name: Date of Service: [name], [name] 02/13/2022 10:45 A M Medical Record Number: [id number] Patient Account Number: [id number] Date of Birth/Sex: Treating RN: 02-08-1956 (67 y.o. [name] Primary Care Deaundra Kutzer: [name] Other Clinician: Referring Brooklin Rieger: Treating Shawnee Gambone/Extender: [name] in Treatment: 3 Wound Status Wound Number: 7 Primary Lymphedema Etiology: Wound Location: Left, Anterior Lower Leg Wound Open Wounding Event: Blister Status: Date Acquired: 01/22/2022 Comorbid Cataracts, Lymphedema, Hypertension, Peripheral Venous Weeks Of Treatment: 3 History: Disease, Osteoarthritis Clustered Wound: No Photos Wound Measurements Length: (cm) 2.1 Width: (cm) 2.4 Depth: (cm) 0.2 Area: (cm) 3.958 Volume: (cm) 0.792 % Reduction in Area: -27.3% % Reduction in Volume: -27.3% Epithelialization: Small (1-33%) Tunneling:  No Undermining: No Wound Description Classification: Full Thickness Without Exposed Suppor Wound Margin: Distinct, outline attached Exudate Amount: Medium Exudate Type: Serosanguineous Exudate Color: red, brown t Structures Foul Odor After Cleansing: No Slough/Fibrino Yes Wound Bed Granulation Amount: Medium (34-66%) Exposed Structure Granulation Quality: Red Fascia Exposed: No Necrotic Amount: Medium (34-66%) Fat Layer (Subcutaneous Tissue) Exposed: Yes Necrotic Quality: Adherent Slough Tendon Exposed: No Muscle Exposed: No Joint Exposed: No Bone Exposed: No Periwound Skin Texture Texture Color No Abnormalities Noted: Yes No Abnormalities Noted: No Erythema: Yes Moisture Erythema Location: Circumferential No Abnormalities Noted: No Dry / Scaly: Yes Temperature / Pain Temperature: No Abnormality Treatment Notes Wound #7 (Lower Leg) Wound Laterality: Left, Anterior [name], [name] ([id number]) [phone] Page 7 of 7 Cleanser Soap and Water Discharge Instruction: May shower and wash wound with dial antibacterial soap and water prior to dressing change. Wound Cleanser Discharge Instruction: Cleanse the wound with wound cleanser prior to applying a clean dressing using gauze sponges, not tissue or cotton balls. Peri-Wound Care Topical Primary Dressing IODOFLEX 0.9% Cadexomer Iodine Pad 4x6 cm Discharge Instruction: Apply to wound bed as instructed Secondary Dressing ABD Pad, 5x9 Discharge Instruction: Apply over primary dressing as directed. Woven Gauze Sponge, Non-Sterile 4x4 in Discharge Instruction: Apply over primary dressing as directed. Secured With Transpore Surgical Tape, 2x10 (in/yd) Discharge Instruction: Secure dressing with tape as directed. Compression Wrap FourPress (4 layer compression wrap) Discharge Instruction: Apply four layer compression as directed. May also use Miliken CoFlex 2 layer compression system as  alternative. Compression Stockings Add-Ons Electronic Signature(s) Signed: 02/13/2022 3:22:54 PM By: Adline Peals Entered By: Adline Peals on 02/13/2022 11:25:05 -------------------------------------------------------------------------------- Vitals Details Patient Name: Date of Service: Erin Gerold NITA [name] 02/13/2022 10:45 A M Medical Record Number: [id number] Patient Account Number: [id number] Date of Birth/Sex: Treating RN: [birth year] (67 y.o. [name] Primary Care Nathian Stencil: [name] Other Clinician: Referring Yonatan Guitron: Treating Tyshika Baldridge/Extender: [name] in Treatment: 3 Vital Signs Time Taken: 11:16 Temperature (  F): 97.9 Height (in): 62 Pulse (bpm): 85 Weight (lbs): 222 Respiratory Rate (breaths/min): 20 Body Mass Index (BMI): 40.6 Blood Pressure (mmHg): 148/83 Reference Range: 80 - 120 mg / dl Electronic Signature(s) Signed: 02/13/2022 3:22:54 PM By: Adline Peals Entered By: Adline Peals on 02/13/2022 11:16:44

## 2022-02-13 NOTE — Progress Notes (Signed)
NEVIA, HENKIN ([id number]) [id number] Page 1 of 12 Visit Report for 02/13/2022 Chief Complaint Document Details Patient Name: Date of Service: [name], [name] 02/13/2022 10:45 A M Medical Record Number: [id number] Patient Account Number: [id number] Date of Birth/Sex: Treating RN: 07-17-1955 (67 y.o. F) Primary Care Provider: [name] Other Clinician: Referring Provider: Treating Provider/Extender: [name] in Treatment: 3 Information Obtained from: Patient Chief Complaint 04/19/2021: The patient is here for ongoing follow-up regarding 2 left lower extremity wounds. 01/22/2022; patient returns to clinic with a wound on the left anterior lower leg secondary to trauma Electronic Signature(s) Signed: 02/13/2022 11:31:17 AM By: [name] MD FACS Entered By: [name] on 02/13/2022 11:31:17 -------------------------------------------------------------------------------- Debridement Details Patient Name: Date of Service: Erin Face [name] 02/13/2022 10:45 A M Medical Record Number: [id number] Patient Account Number: [id number] Date of Birth/Sex: Treating RN: Mar 21, 1955 (66 y.o. [name] Primary Care Provider: [name] Other Clinician: Referring Provider: Treating Provider/Extender: [name] in Treatment: 3 Debridement Performed for Assessment: Wound #7 Left,Anterior Lower Leg Performed By: Physician [name], MD Debridement Type: Debridement Level of Consciousness (Pre-procedure): Awake and Alert Pre-procedure Verification/Time Out Yes - 11:27 Taken: Start Time: 11:27 Pain Control: Lidocaine 5% topical ointment T Area Debrided (L x W): otal 2.1 (cm) x 2.4 (cm) = 5.04 (cm) Tissue and other material debrided: Non-Viable, Eschar, Slough, Subcutaneous, Slough Level: Skin/Subcutaneous Tissue Debridement Description: Excisional Instrument:  Curette Bleeding: Minimum Hemostasis Achieved: Pressure Response to Treatment: Procedure was tolerated well Level of Consciousness (Post- Awake and Alert procedure): Post Debridement Measurements of Total Wound Length: (cm) 2.1 Width: (cm) 2.4 Depth: (cm) 0.2 Volume: (cm) 0.792 Character of Wound/Ulcer Post Debridement: Improved Post Procedure Diagnosis Same as [name], [name] ([id number]) [id number] Page 2 of 12 Notes scribed for Dr. Celine Ahr by Adline Peals, RN Electronic Signature(s) Signed: 02/13/2022 12:20:40 PM By: [name] MD FACS Signed: 02/13/2022 3:22:54 PM By: Adline Peals Entered By: Adline Peals on 02/13/2022 11:29:18 -------------------------------------------------------------------------------- HPI Details Patient Name: Date of Service: Erin Face [name] 02/13/2022 10:45 A M Medical Record Number: [id number] Patient Account Number: [id number] Date of Birth/Sex: Treating RN: 1955/04/24 (67 y.o. 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 She tells [name] that she fell on the gravel driveway in July. She developed injuries on her distal lower leg which have not healed. She saw her primary physician on 11/15/2017 who noted her left shin injuries. Gave her antibiotics. At that point the wounds were almost circumferential however most were less than 1.5 cm. Weeping edema fluid was noted. She was referred here for evaluation. The patient has a history of chronic lower extremity edema. 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 She has had prior  history with leg weeping fluid. She does not wear compression stockings. She is not doing anything specific to these wound areas. 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 She also has lymphedema. Put her in 3  layer compression and silver alginate last week. She has 3 small wounds with depth just lateral to the tibia. More problematically than this she has numerous shallow areas some of which are almost canal like in shape with tightly adherent painful debris. It would be very difficult and time- consuming to go through this and attempt to individually debride all these areas.. I changed her to collagen today to see if that would help with any of the surface debris on some of these wounds. Otherwise we will not be able to put this in compression we have to have someone change the dressing. The patient is not eligible for home health 12/25/17 on evaluation today patient actually appears to be doing rather well in regard to the ulcer on her lower extremity. Fortunately there does not appear to be evidence of infection at this time. She has been tolerating the dressing changes without complication. This includes the compression wrap. The only issue she had was that the wrap was initially placed over her bunion region which actually calls her some discomfort and pain. Other than that things seem to be going rather well. 01/01/2018 Seen today for follow-up and management of left lower extremity wound and lymphedema. T oday she presents with a new wound towards to the left lateral LE. Recently treated with a 7 day course of amoxicillin; reason for antibiotic dose is unknown at this time. Tolerating current treatment of collagen  with 4- layer wraps. She obtained a venous reflux study on 12/25/17. Studies show on the right abnormal reflux times of the popliteal vein, great saphenous vein at the saphenofemoral junction at the proximal thigh, great saphenous vein at the mid calf, and origin of the small saphenous vein.No superficial thrombosis. No deep vein thrombosis in the common femoral, femoral,and popliteal veins. Left abnormal reflex times as well of the common femoral vein, popliteal vein, and a great saphenous vein at the saphenofemoral junction, In great saphenous vein at the mid thigh w/o thrombosis. Has any issues or concerns during visit today. Recommended follow-up to vascular specialist due to abnormalities from the venous reflux study. Denies fever, pain, chills, dizziness, nausea, or vomiting. 01/08/18 upon evaluation today the patient actually seems to be showing some signs of improvement in my opinion at this point in regard to the lower extremity ulcerated areas. She still has a lot of drainage but fortunately nothing that appears to be too significant currently. I have been very happy with the overall progress I see today compared to where things were during the last evaluation that I had with her. Nonetheless she has not had her appointment with the vein specialist as of yet in fact we were able to get this approved for her today and confirmed with them she will be seeing them on December 26. Nonetheless in general I do feel like the compression wraps is doing well for her. 01/17/18; quite a bit of improvement since last time I saw this patient she has a small open area remaining on the left lateral calf and even smaller area medially. She has lymphedema chronic stasis changes with distal skin fibrosis. She has an appointment with vascular surgery later this month 01/24/2018; the patient's medial leg has closed. Still a small open area on the left lateral leg. She states that the 4 layer compression we put on  last week was too tight and she had to take it off over a few days ago. We have had resultant increase in her lymphedema in the dorsal  foot and a proximal calf. Fortunately that does not seem to have resulted in any deterioration in her wounds The patient is going to need compression stockings. We have given her measurements to phone elastic therapy in [place]. She has vascular surgery consult on December 26 02/07/18; she's had continuous contraction on the left lateral leg wound which is now very small. Currently using silver alginate under 3 layer compression She saw Dr. Trula Slade of vascular surgery on 02/06/18. It was noted that she had a normal reflux times in the popliteal vein, great saphenous vein at the saphenofemoral junction, great saphenous vein at the proximal thigh great saphenous vein at the mid calf and origin of the small saphenous vein. It was noted that she had significant reflux in the left saphenous veins with diameter measurements in the 0.7-0.8 cm range. It was felt she would benefit from laser ablation to help minimize the risk of ulcer recurrence. It was recommended that she wear 20-30 thigh-high compression stockings and have follow-up in 4-6 weeks 02/17/2018; I thought this lady would be healed however her compression slipped down and she developed increasing swelling and the wound is actually larger. 1/13; we had deterioration last week after the patient's compression slipped down and she developed periwound swelling. I increased her compression before layers we have been using silver alginate we are a lot better again today. She has her compression stockings in waiting [name], [name] ([id number]) [phone].pdf Page 3 of 12 1/23; the patient's wounds are totally healed today. She has her stockings. This was almost circumferential skin damage. She follows up with Dr. Trula Slade of vascular surgery next Monday. READMISSION 02/21/2021 This is a now  67 year old woman that we had in clinic here discharging in January 2020 with wounds on her left calf chronic venous insufficiency. She was discharged with 30/40 stockings. It does not sound like she has worn stockings in about a year largely from not being able to get them on herself. In November she developed new blisters on her legs an area laterally is opened into a fairly sizable wound. She has weeping posteriorly as well. She has been using Neosporin and Band-Aids. The patient did see Dr. Trula Slade in 2019 and 2020. He felt she might benefit from laser ablation in her left saphenous veins although because of the wound that healed I do not think he went through with it. She might benefit from seeing him again. Her ABI on the left is 1. 1/17; patient's wound on the posterior left calf is closed she has the 2 large areas last week. We put her in 4-layer compression for edema control is a lot better. Our intake nurse noted greenish drainage and odor. We have been using silver alginate 1/25; PCR culture I did have the substantial wound area on the left lateral lower leg showed staff aureus and group A strep. Low titers of coag negative staph which are probably skin contaminants. Resistance detected to tetracycline methicillin and macrolides. I gave her a starter kit of Nuzyra 150 mg x 3 for 2 days then 300 mg for a further 7 days. Marked odor considerable increase in surrounding erythema. We are using Iodoflex last week however I have changed to silver alginate with underlying Bactroban 2/1; she is completing her [place] tomorrow. The degree of erythema around the wounds looks a lot better. Odor has improved. I gave her silver alginate and Bactroban last week and changing her back to Iodoflex to continue with ongoing debridement 2/8; the periwound looks a  lot better. Surface of the wound also looks somewhat better although there is still ongoing debridement to be done we have been using Iodoflex under  compression. Primary dressing and silver alginate 2/15; left posterior calf. Some improvement in the surface of the wound but still very gritty we have been using Iodoflex under compression. 04/05/2021: Left lateral posterior calf. She continues to have significant amounts of drainage, some of which is probably comprised of the Iodoflex microbleeds. There is no odor to the drainage. The satellite lesion on the more posterior aspect of the calf is epithelializing nicely and has contracted quite a bit. There is robust granulation tissue in the dominant wound with minimal adherent slough. 04/12/2021: The satellite lesion has nearly closed. She continues to have good granulation tissue with minimal slough of the larger primary wound. She continues to have a fair amount of drainage, but I think this is less secondary to switching her dressing from Iodoflex to Prisma. 04/19/2021: The satellite lesion is almost completely epithelialized. The larger primary wound continues to contract with good granulation tissue and minimal slough. She is currently in [place] with compression. 04/26/2021: The satellite lesion has closed. The larger primary wound has contracted further and the granulation tissue is robust without being hypertrophic. Minimal slough is present. 05/03/2021: The satellite lesion remains closed. The larger primary wound has a bit of slough, but has also contracted further with good perimeter epithelialization. Good granulation tissue at the wound surface. There was some greenish drainage on the dressing when it was removed; it is not the blue-green typically associated with Pseudomonas aeruginosa, however. 05/10/2021: The primary wound continues to contract. There is minimal slough. The granulation tissue at 9:00 is a bit hypertrophic. No significant drainage. No odor. 05/17/2021: The wound is a little bit smaller today with good granulation tissue. Minimal slough. No significant drainage or  odor. 05/24/2021: The wound continues to contract and has a nice base of granulation tissue. Small amount of slough. No concern for infection. 05/31/2021: For some reason, the wound measured slightly larger today but overall it still appears to be in good condition with a nice base of granulation tissue and minimal slough. 06/07/2021: The wound is smaller today. Good granulation tissue and minimal slough. 06/14/2021: The wound is unchanged in size. She continues to accumulate some slough. Good granulation tissue on the surface. 06/20/2021: The wound is smaller today. Minimal slough with good granulation tissue. 06/27/2021: The wound on her left lateral leg is smaller today with just a bit of slough and eschar accumulation. Unfortunately, she has opened a new superficial wound on her right lower extremity. She has 3+ pitting edema to the knees on that leg and she does not wear compression stockings. 07/04/2021: In addition to the superficial wound on her right lower extremity that she opened up last week, she has opened 3 additional sites on the same leg. Her compression wraps were clearly not in correct position when she came to clinic today as they were at about the mid calf level rather than to the tibial tuberosity. She says that they slipped earlier and she had to come back on Friday to have them redone. The wound on her left lateral leg is perhaps slightly larger with some slough accumulation. 07/11/2021: The superficial wounds on her right lower extremity are nearly closed. They just have a thin layer of eschar overlying them. The wound on her left lateral leg is a little bit shallower and has a bit of slough accumulation. 07/17/2021: The right  medial lower extremity leg wounds have almost completely closed; one of them just has a tiny opening with a little bit of serous drainage. The left lateral leg wound is really unchanged. It seems to be stalled. 07/24/2021: The right leg wounds are completely closed.  The left lateral leg wound is actually bigger today. It is a bit more tender. There is a minor accumulation of slough on the surface. 07/31/2021: The culture that I took last week was positive for MRSA. We added mupirocin under the silver alginate and I prescribed doxycycline. She has been tolerating this well. The wound is smaller today but does have slough accumulation. No significant pain or drainage. 08/07/2021: She completed her course of doxycycline. The wound looks much better today. It is smaller and the periwound is less inflamed. She does have some slough accumulation on the surface. 08/14/2021: The wound continues to contract. There is slough on the wound surface, but the periwound is intact without inflammation or induration. 08/21/2021: The wound is down to just 2 small open sites. They both have a bit of slough accumulation. 08/28/2021: The wound is down to just 1 small open site with a little bit of slough and eschar accumulation. [name], [name] ([id number]) [id number] Page 4 of 12 09/04/2021: The wound continues to contract but remains open. It is clean without any slough. 09/12/2021: No real change in the overall wound dimensions, but it is flush with the surrounding skin. There is a little bit of slough accumulation. Edema control is good. 09/22/2021: The wound is smaller and even more superficial. Minimal slough accumulation. Good control of edema. 09/29/2021: The wound continues to contract. She reports that it was a little bit "twingey" during the week. It is a little bit dry on inspection today. Light accumulation of slough. Good edema control. 10/06/2021: The wound is about the same size today. There is a little slough on the surface. Edema control is good. 10/13/2021: The wound is about the same size but more superficial. A little bit of slough on the surface. 10/23/2021: The wound is slightly smaller and continues to fill in. Minimal slough on the wound  surface. 10/30/2021: The wound continues to contract but has not yet closed. Light slough and eschar present. 11/06/2021: The wound persists, but seems a little bit more epithelialized. There is still slough and eschar accumulation. 11/14/2021: The wound continues to contract but persists. Light eschar around the wound. 11/22/2021: The wound is down to just a pinhole opening. There is eschar overlying the surface. Edema control is excellent. 11/29/2021: Her wound is closed. READMISSION 01/22/2022 This is a patient to be discharged in October. She has severe chronic venous insufficiency at that time she had a wound on her left lower leg posteriorly also the right leg at some point. These closed. We discharged her in 30/40 mm stockings from elastic therapy which she has been wearing religiously. She tells [name] that she was traumatized by a dolly while shopping at [employer] about 2 to 3 weeks ago. She has been left with a left anterior lower leg wound. She comes in in another pair of stockings other than her 3040s. She does not have an arterial issue with her last ABI in the left at 1.2 12/18; this is a patient who has chronic venous insufficiency and recurrent venous insufficiency ulcers. She has a area on her left anterior lower leg and we readmitted her to the clinic last week. We use silver alginate and 4-layer compression. ABI was 1.2 She brought  in her stocking which was a 20/30 below-knee stocking from elastic therapy. She may require a 30/40 mm equivalent stockings after this wound heals. 02/06/2022: The intake nurse reported an odor coming from the wound when she first unwrapped it but this abated after the leg was washed. There is thick layer of slough on the surface. 02/13/2022: The wound is cleaner today. It measured larger, but on visual inspection, I think some crust of Iodoflex was included in the wound measurements; the actual wound itself looks about the same to slightly smaller. Edema control  is good. Electronic Signature(s) Signed: 02/13/2022 11:32:12 AM By: [name] MD FACS Entered By: [name] on 02/13/2022 11:32:12 -------------------------------------------------------------------------------- Physical Exam Details Patient Name: Date of Service: Erin Gerold NITA [name] 02/13/2022 10:45 A M Medical Record Number: [id number] Patient Account Number: [id number] Date of Birth/Sex: Treating RN: 11/26/1955 (67 y.o. F) Primary Care Provider: [name] Other Clinician: Referring Provider: Treating Provider/Extender: [name] in Treatment: 3 Constitutional Slightly hypertensive. . . . No acute distress. Respiratory Normal work of breathing on room air. Notes 02/13/2022: The wound is cleaner today. It measured larger, but on visual inspection, I think some crust of Iodoflex was included in the wound measurements; the actual wound itself looks about the same to slightly smaller. Edema control is good. Electronic Signature(s) Signed: 02/13/2022 11:40:32 AM By: [name] MD FACS Entered By: [name] on 02/13/2022 11:40:32 [name], [name] ([id number]) [id number] Page 5 of 12 -------------------------------------------------------------------------------- Physician Orders Details Patient Name: Date of Service: [name], [name] 02/13/2022 10:45 A M Medical Record Number: [id number] Patient Account Number: [id number] Date of Birth/Sex: Treating RN: 10-26-1955 (67 y.o. 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 - Dr. Celine Ahr Room 3 Return A Anesthetic (In clinic) Topical Lidocaine 5% applied to wound bed Bathing/ Shower/ Hygiene May shower with protection but do not get wound dressing(s) wet. Protect dressing(s) with water repellant cover (for example, large plastic bag) or a cast cover and may then take shower. Edema Control - Lymphedema / SCD / Other Avoid standing for long periods of time. Patient to wear own compression stockings every day. Exercise regularly Wound Treatment Wound #7 - Lower Leg Wound Laterality: Left, Anterior Cleanser: Soap and Water 1 x Per Week/30 Days Discharge Instructions: May shower and wash wound with dial antibacterial soap and water prior to dressing change. Cleanser: Wound Cleanser 1 x Per Week/30 Days Discharge Instructions: Cleanse the wound with wound cleanser prior to applying a clean dressing using gauze sponges, not tissue or cotton balls. Prim Dressing: IODOFLEX 0.9% Cadexomer Iodine Pad 4x6 cm 1 x Per Week/30 Days ary Discharge Instructions: Apply to wound bed as instructed Secondary Dressing: ABD Pad, 5x9 1 x Per Week/30 Days Discharge Instructions: Apply over primary dressing as directed. Secondary Dressing: Woven Gauze Sponge, Non-Sterile 4x4 in 1 x Per Week/30 Days Discharge Instructions: Apply over primary dressing as directed. Secured With: Transpore Surgical Tape, 2x10 (in/yd) 1 x Per Week/30 Days Discharge Instructions: Secure dressing with tape as directed. Compression Wrap: FourPress (4 layer compression wrap) 1 x Per Week/30 Days Discharge Instructions: Apply four layer compression as directed. May also  use Miliken CoFlex 2 layer compression system as alternative. Patient Medications llergies: No Known Drug Allergies A Notifications Medication Indication Start End 02/13/2022 lidocaine DOSE topical 5 % ointment - ointment topical Electronic Signature(s) KESHONNA, VALVO ([id number])  [id number] Page 6 of 12 Signed: 02/13/2022 12:20:40 PM By: [name] MD FACS Entered By: [name] on 02/13/2022 11:40:45 -------------------------------------------------------------------------------- Problem List Details Patient Name: Date of Service: Erin Gerold NITA [name] 02/13/2022 10:45 A M Medical Record Number: [id number] Patient Account Number: [id number] Date of Birth/Sex: Treating RN: December 25, 1955 (67 y.o. 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 y.o. F) Primary Care Provider: [name] Other Clinician: Referring Provider: Treating Provider/Extender: [name] in Treatment: [address] CLEATUS, GOODIN [name]  ([id number]) [id number] Page 11 of 12 Information Obtained From Patient Constitutional Symptoms (General Health) Medical History: Past Medical History Notes: morbid obesity Eyes Medical History: Positive for: Cataracts Hematologic/Lymphatic Medical History: Positive for: Lymphedema Cardiovascular Medical History: Positive for: Hypertension; Peripheral Venous Disease Past Medical History Notes: schamberg disease, hyperlipidemia Gastrointestinal Medical History: Past Medical History Notes: diverticulitis , h/o obstruction due to diverticulitis , colostomy and colostomy reversal Endocrine Medical History: Past Medical History Notes: hypothyroidism Integumentary (Skin) Medical History: Negative for: History of Burn Musculoskeletal Medical History: Positive for: Osteoarthritis HBO Extended History Items Eyes: Cataracts Immunizations Pneumococcal Vaccine: Received Pneumococcal Vaccination: Yes Received Pneumococcal Vaccination On or After 60th Birthday: Yes Implantable Devices None Hospitalization / Surgery History Type of Hospitalization/Surgery colostomy reversal 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 Family and Social History Cancer: No; Diabetes: No; Heart Disease: Yes - Mother,Father; Hereditary Spherocytosis: No; Hypertension: Yes - Mother,Father; Kidney Disease: Yes - Mother; Lung Disease: No; Seizures: No; Stroke: No; Thyroid Problems: Yes - Mother; Tuberculosis: No; Never smoker; Marital Status - Single; Alcohol Use: Never; Drug Use: No History; Caffeine Use: Daily - coffee; Financial Concerns: No; Food, Clothing or Shelter Needs: No; Support System Lacking: No; Transportation Concerns: No Electronic Signature(s) Signed: 02/13/2022  12:20:40 PM By: [name] MD FACS Entered By: [name] on 02/13/2022 11:32:27 BALEIGH, [name] [name] ([id number]) [id number] Page 12 of  12 -------------------------------------------------------------------------------- SuperBill Details Patient Name: Date of Service: ALEXYIA, GUARINO 02/13/2022 Medical Record Number: [id number] Patient Account Number: [id number] Date of Birth/Sex: Treating RN: [birth year] (67 y.o. 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 with other spec Modifier: ified severity Quantity: 1 Physician Procedures : CPT4 Code Description Modifier [id number] 02334 - WC PHYS LEVEL 3 - EST PT 25 ICD-10 Diagnosis Description L97.928 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

## 2022-02-14 ENCOUNTER — Other Ambulatory Visit: Payer: Self-pay | Admitting: Nurse Practitioner

## 2022-02-14 ENCOUNTER — Encounter: Payer: Self-pay | Admitting: Internal Medicine

## 2022-02-14 DIAGNOSIS — M4005 Postural kyphosis, thoracolumbar region: Secondary | ICD-10-CM

## 2022-02-16 ENCOUNTER — Encounter: Payer: Self-pay | Admitting: Nurse Practitioner

## 2022-02-16 ENCOUNTER — Other Ambulatory Visit: Payer: Self-pay | Admitting: Nurse Practitioner

## 2022-02-16 ENCOUNTER — Other Ambulatory Visit (HOSPITAL_COMMUNITY): Payer: Self-pay

## 2022-02-16 MED ORDER — MELOXICAM 7.5 MG PO TABS
7.5000 mg | ORAL_TABLET | Freq: Every day | ORAL | 0 refills | Status: DC
  Filled 2022-02-16: qty 30, 30d supply, fill #0

## 2022-02-20 ENCOUNTER — Encounter (HOSPITAL_BASED_OUTPATIENT_CLINIC_OR_DEPARTMENT_OTHER): Payer: Commercial Managed Care - PPO | Admitting: General Surgery

## 2022-02-20 DIAGNOSIS — L97822 Non-pressure chronic ulcer of other part of left lower leg with fat layer exposed: Secondary | ICD-10-CM | POA: Diagnosis not present

## 2022-02-20 DIAGNOSIS — I739 Peripheral vascular disease, unspecified: Secondary | ICD-10-CM | POA: Diagnosis not present

## 2022-02-20 DIAGNOSIS — E039 Hypothyroidism, unspecified: Secondary | ICD-10-CM | POA: Diagnosis not present

## 2022-02-20 DIAGNOSIS — I890 Lymphedema, not elsewhere classified: Secondary | ICD-10-CM | POA: Diagnosis not present

## 2022-02-20 DIAGNOSIS — L97928 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: Secondary | ICD-10-CM | POA: Diagnosis not present

## 2022-02-20 DIAGNOSIS — I872 Venous insufficiency (chronic) (peripheral): Secondary | ICD-10-CM | POA: Diagnosis not present

## 2022-02-20 NOTE — Progress Notes (Signed)
Erin, [name] ([id number]) [id number] Page 1 of 12 Visit Report for 02/20/2022 Chief Complaint Document Details Patient Name: Date of Service: [name], [name] 02/20/2022 9:30 A M Medical Record Number: [id number] Patient Account Number: [id number] Date of Birth/Sex: Treating RN: 11/12/1955 (67 y.o. F) Primary Care Provider: [name] Other Clinician: Referring Provider: Treating Provider/Extender: [name] in Treatment: 4 Information Obtained from: Patient Chief Complaint 04/19/2021: The patient is here for ongoing follow-up regarding 2 left lower extremity wounds. 01/22/2022; patient returns to clinic with a wound on the left anterior lower leg secondary to trauma Electronic Signature(s) Signed: 02/20/2022 10:13:38 AM By: [name] MD FACS Entered By: [name] on 02/20/2022 10:13:37 -------------------------------------------------------------------------------- Debridement Details Patient Name: Date of Service: [name] [name]. 02/20/2022 9:30 A M Medical Record Number: [id number] Patient Account Number: [id number] Date of Birth/Sex: Treating RN: April 26, 1955 (67 y.o. [name] Primary Care Provider: [name] Other Clinician: Referring Provider: Treating Provider/Extender: [name] in Treatment: 4 Debridement Performed for Assessment: Wound #7 Left,Anterior Lower Leg Performed By: Physician [name], MD Debridement Type: Debridement Level of Consciousness (Pre-procedure): Awake and Alert Pre-procedure Verification/Time Out Yes - 09:55 Taken: Start Time: 09:55 Pain Control: Lidocaine 5% topical ointment T Area Debrided (L x W): otal 2 (cm) x 2.2 (cm) = 4.4 (cm) Tissue and other material debrided: Non-Viable, Slough, Subcutaneous, Slough Level: Skin/Subcutaneous Tissue Debridement Description: Excisional Instrument: Curette Bleeding:  Minimum Hemostasis Achieved: Pressure End Time: 09:57 Procedural Pain: 0 Post Procedural Pain: 0 Response to Treatment: Procedure was tolerated well Level of Consciousness (Post- Awake and Alert procedure): Post Debridement Measurements of Total Wound Length: (cm) 2 Width: (cm) 2.2 Depth: (cm) 0.2 Volume: (cm) 0.691 Character of Wound/Ulcer Post Debridement: Improved Erin, [name] ([id number]) [id number] Page 2 of 12 Post Procedure Diagnosis Same as Pre-procedure Notes Scribed for Dr. Celine Ahr by J.[name] Electronic Signature(s) Signed: 02/20/2022 11:56:27 AM By: [name] MD FACS Signed: 02/20/2022 4:07:41 PM By: [name] RN Entered By: [name] on 02/20/2022 10:13:48 -------------------------------------------------------------------------------- HPI Details Patient Name: Date of Service: Erin Face D. 02/20/2022 9:30 A M Medical Record Number: [id number] Patient Account Number: [id number] Date of Birth/Sex: Treating RN: 1956-01-03 (67 y.o. 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 She tells [name] that she fell on the gravel driveway in July. She developed injuries on her distal lower leg which have not healed. She saw her primary physician on 11/15/2017 who noted her left shin injuries. Gave her antibiotics. At that point the wounds were almost circumferential however most were less than 1.5 cm. Weeping edema fluid was noted. She was referred here for evaluation. The patient has a history of chronic lower extremity edema. 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 She has had prior history with leg weeping fluid. She does not wear compression stockings. She is not doing anything specific to these wound areas. 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  She also has lymphedema. Put her in 3 layer compression and silver alginate last week. She has 3 small wounds with depth just lateral to the tibia. More problematically than this she has numerous shallow areas some of which are almost canal like in shape with tightly adherent painful debris. It would be very difficult and time- consuming to go through this and attempt to individually debride all these areas.. I changed her to collagen today to see if that would help with any of the surface debris on some of these wounds. Otherwise we will not be able to put this in compression we have to have someone change the dressing. The patient is not eligible for home health 12/25/17 on evaluation today patient actually appears to be doing rather well in regard to the ulcer on her lower extremity. Fortunately there does not appear to be evidence of infection at this time. She has been tolerating the dressing changes without complication. This includes the compression wrap. The only issue she had was that the wrap was initially placed over her bunion region which actually calls her some discomfort and pain. Other than that things seem to be going rather well. 01/01/2018 Seen today for follow-up and management of left lower extremity wound and lymphedema. T oday she presents with a new wound towards to the left lateral LE. Recently treated with a 7 day course of amoxicillin; reason for antibiotic dose is unknown at this time. Tolerating  current treatment of collagen with 4- layer wraps. She obtained a venous reflux study on 12/25/17. Studies show on the right abnormal reflux times of the popliteal vein, great saphenous vein at the saphenofemoral junction at the proximal thigh, great saphenous vein at the mid calf, and origin of the small saphenous vein.No superficial thrombosis. No deep vein thrombosis in the common femoral, femoral,and popliteal veins. Left abnormal reflex times as well of the common femoral vein, popliteal vein, and a great saphenous vein at the saphenofemoral junction, In great saphenous vein at the mid thigh w/o thrombosis. Has any issues or concerns during visit today. Recommended follow-up to vascular specialist due to abnormalities from the venous reflux study. Denies fever, pain, chills, dizziness, nausea, or vomiting. 01/08/18 upon evaluation today the patient actually seems to be showing some signs of improvement in my opinion at this point in regard to the lower extremity ulcerated areas. She still has a lot of drainage but fortunately nothing that appears to be too significant currently. I have been very happy with the overall progress I see today compared to where things were during the last evaluation that I had with her. Nonetheless she has not had her appointment with the vein specialist as of yet in fact we were able to get this approved for her today and confirmed with them she will be seeing them on December 26. Nonetheless in general I do feel like the compression wraps is doing well for her. 01/17/18; quite a bit of improvement since last time I saw this patient she has a small open area remaining on the left lateral calf and even smaller area medially. She has lymphedema chronic stasis changes with distal skin fibrosis. She has an appointment with vascular surgery later this month 01/24/2018; the patient's medial leg has closed. Still a small open area on the left lateral leg. She states that the  4 layer compression we put on last week was too tight and she had to take it off over a few days ago. We have had  resultant increase in her lymphedema in the dorsal foot and a proximal calf. Fortunately that does not seem to have resulted in any deterioration in her wounds The patient is going to need compression stockings. We have given her measurements to phone elastic therapy in . She has vascular surgery consult on December 26 02/07/18; she's had continuous contraction on the left lateral leg wound which is now very small. Currently using silver alginate under 3 layer compression She saw Dr. Trula Slade of vascular surgery on 02/06/18. It was noted that she had a normal reflux times in the popliteal vein, great saphenous vein at the saphenofemoral junction, great saphenous vein at the proximal thigh great saphenous vein at the mid calf and origin of the small saphenous vein. It was noted that she had significant reflux in the left saphenous veins with diameter measurements in the 0.7-0.8 cm range. It was felt she would benefit from laser RIVER, MCKERCHER D ([id number]) [id number] Page 3 of 12 ablation to help minimize the risk of ulcer recurrence. It was recommended that she wear 20-30 thigh-high compression stockings and have follow-up in 4-6 weeks 02/17/2018; I thought this lady would be healed however her compression slipped down and she developed increasing swelling and the wound is actually larger. 1/13; we had deterioration last week after the patient's compression slipped down and she developed periwound swelling. I increased her compression before layers we have been using silver alginate we are a lot better again today. She has her compression stockings in waiting 1/23; the patient's wounds are totally healed today. She has her stockings. This was almost circumferential skin damage. She follows up with Dr. Trula Slade of vascular surgery next  Monday. READMISSION 02/21/2021 This is a now 66 year old woman that we had in clinic here discharging in January 2020 with wounds on her left calf chronic venous insufficiency. She was discharged with 30/40 stockings. It does not sound like she has worn stockings in about a year largely from not being able to get them on herself. In November she developed new blisters on her legs an area laterally is opened into a fairly sizable wound. She has weeping posteriorly as well. She has been using Neosporin and Band-Aids. The patient did see Dr. Trula Slade in 2019 and 2020. He felt she might benefit from laser ablation in her left saphenous veins although because of the wound that healed I do not think he went through with it. She might benefit from seeing him again. Her ABI on the left is 1. 1/17; patient's wound on the posterior left calf is closed she has the 2 large areas last week. We put her in 4-layer compression for edema control is a lot better. Our intake nurse noted greenish drainage and odor. We have been using silver alginate 1/25; PCR culture I did have the substantial wound area on the left lateral lower leg showed staff aureus and group A strep. Low titers of coag negative staph which are probably skin contaminants. Resistance detected to tetracycline methicillin and macrolides. I gave her a starter kit of Nuzyra 150 mg x 3 for 2 days then 300 mg for a further 7 days. Marked odor considerable increase in surrounding erythema. We are using Iodoflex last week however I have changed to silver alginate with underlying Bactroban 2/1; she is completing her [place] tomorrow. The degree of erythema around the wounds looks a lot better. Odor has improved. I gave her silver alginate and Bactroban last week and changing her back to Iodoflex to continue  with ongoing debridement 2/8; the periwound looks a lot better. Surface of the wound also looks somewhat better although there is still ongoing debridement  to be done we have been using Iodoflex under compression. Primary dressing and silver alginate 2/15; left posterior calf. Some improvement in the surface of the wound but still very gritty we have been using Iodoflex under compression. 04/05/2021: Left lateral posterior calf. She continues to have significant amounts of drainage, some of which is probably comprised of the Iodoflex microbleeds. There is no odor to the drainage. The satellite lesion on the more posterior aspect of the calf is epithelializing nicely and has contracted quite a bit. There is robust granulation tissue in the dominant wound with minimal adherent slough. 04/12/2021: The satellite lesion has nearly closed. She continues to have good granulation tissue with minimal slough of the larger primary wound. She continues to have a fair amount of drainage, but I think this is less secondary to switching her dressing from Iodoflex to Prisma. 04/19/2021: The satellite lesion is almost completely epithelialized. The larger primary wound continues to contract with good granulation tissue and minimal slough. She is currently in [place] with compression. 04/26/2021: The satellite lesion has closed. The larger primary wound has contracted further and the granulation tissue is robust without being hypertrophic. Minimal slough is present. 05/03/2021: The satellite lesion remains closed. The larger primary wound has a bit of slough, but has also contracted further with good perimeter epithelialization. Good granulation tissue at the wound surface. There was some greenish drainage on the dressing when it was removed; it is not the blue-green typically associated with Pseudomonas aeruginosa, however. 05/10/2021: The primary wound continues to contract. There is minimal slough. The granulation tissue at 9:00 is a bit hypertrophic. No significant drainage. No odor. 05/17/2021: The wound is a little bit smaller today with good granulation tissue. Minimal  slough. No significant drainage or odor. 05/24/2021: The wound continues to contract and has a nice base of granulation tissue. Small amount of slough. No concern for infection. 05/31/2021: For some reason, the wound measured slightly larger today but overall it still appears to be in good condition with a nice base of granulation tissue and minimal slough. 06/07/2021: The wound is smaller today. Good granulation tissue and minimal slough. 06/14/2021: The wound is unchanged in size. She continues to accumulate some slough. Good granulation tissue on the surface. 06/20/2021: The wound is smaller today. Minimal slough with good granulation tissue. 06/27/2021: The wound on her left lateral leg is smaller today with just a bit of slough and eschar accumulation. Unfortunately, she has opened a new superficial wound on her right lower extremity. She has 3+ pitting edema to the knees on that leg and she does not wear compression stockings. 07/04/2021: In addition to the superficial wound on her right lower extremity that she opened up last week, she has opened 3 additional sites on the same leg. Her compression wraps were clearly not in correct position when she came to clinic today as they were at about the mid calf level rather than to the tibial tuberosity. She says that they slipped earlier and she had to come back on Friday to have them redone. The wound on her left lateral leg is perhaps slightly larger with some slough accumulation. 07/11/2021: The superficial wounds on her right lower extremity are nearly closed. They just have a thin layer of eschar overlying them. The wound on her left lateral leg is a little bit shallower and has  a bit of slough accumulation. 07/17/2021: The right medial lower extremity leg wounds have almost completely closed; one of them just has a tiny opening with a little bit of serous drainage. The left lateral leg wound is really unchanged. It seems to be stalled. 07/24/2021: The  right leg wounds are completely closed. The left lateral leg wound is actually bigger today. It is a bit more tender. There is a minor accumulation of slough on the surface. 07/31/2021: The culture that I took last week was positive for MRSA. We added mupirocin under the silver alginate and I prescribed doxycycline. She has been tolerating this well. The wound is smaller today but does have slough accumulation. No significant pain or drainage. 08/07/2021: She completed her course of doxycycline. The wound looks much better today. It is smaller and the periwound is less inflamed. She does have some slough accumulation on the surface. JULI, ODOM ([id number]) [id number] Page 4 of 12 08/14/2021: The wound continues to contract. There is slough on the wound surface, but the periwound is intact without inflammation or induration. 08/21/2021: The wound is down to just 2 small open sites. They both have a bit of slough accumulation. 08/28/2021: The wound is down to just 1 small open site with a little bit of slough and eschar accumulation. 09/04/2021: The wound continues to contract but remains open. It is clean without any slough. 09/12/2021: No real change in the overall wound dimensions, but it is flush with the surrounding skin. There is a little bit of slough accumulation. Edema control is good. 09/22/2021: The wound is smaller and even more superficial. Minimal slough accumulation. Good control of edema. 09/29/2021: The wound continues to contract. She reports that it was a little bit "twingey" during the week. It is a little bit dry on inspection today. Light accumulation of slough. Good edema control. 10/06/2021: The wound is about the same size today. There is a little slough on the surface. Edema control is good. 10/13/2021: The wound is about the same size but more superficial. A little bit of slough on the surface. 10/23/2021: The wound is slightly smaller and continues to  fill in. Minimal slough on the wound surface. 10/30/2021: The wound continues to contract but has not yet closed. Light slough and eschar present. 11/06/2021: The wound persists, but seems a little bit more epithelialized. There is still slough and eschar accumulation. 11/14/2021: The wound continues to contract but persists. Light eschar around the wound. 11/22/2021: The wound is down to just a pinhole opening. There is eschar overlying the surface. Edema control is excellent. 11/29/2021: Her wound is closed. READMISSION 01/22/2022 This is a patient to be discharged in October. She has severe chronic venous insufficiency at that time she had a wound on her left lower leg posteriorly also the right leg at some point. These closed. We discharged her in 30/40 mm stockings from elastic therapy which she has been wearing religiously. She tells [name] that she was traumatized by a dolly while shopping at [employer] about 2 to 3 weeks ago. She has been left with a left anterior lower leg wound. She comes in in another pair of stockings other than her 3040s. She does not have an arterial issue with her last ABI in the left at 1.2 12/18; this is a patient who has chronic venous insufficiency and recurrent venous insufficiency ulcers. She has a area on her left anterior lower leg and we readmitted her to the clinic last week. We use silver alginate  and 4-layer compression. ABI was 1.2 She brought in her stocking which was a 20/30 below-knee stocking from elastic therapy. She may require a 30/40 mm equivalent stockings after this wound heals. 02/06/2022: The intake nurse reported an odor coming from the wound when she first unwrapped it but this abated after the leg was washed. There is thick layer of slough on the surface. 02/13/2022: The wound is cleaner today. It measured larger, but on visual inspection, I think some crust of Iodoflex was included in the wound measurements; the actual wound itself looks about the same  to slightly smaller. Edema control is good. 02/20/2022: The wound is cleaner again today. It is also measuring a little bit smaller, but there has been some moisture related periwound breakdown. Edema control is good. Electronic Signature(s) Signed: 02/20/2022 10:14:14 AM By: [name] MD FACS Entered By: [name] on 02/20/2022 10:14:14 -------------------------------------------------------------------------------- Physical Exam Details Patient Name: Date of Service: [name] [name]. 02/20/2022 9:30 A M Medical Record Number: [id number] Patient Account Number: [id number] Date of Birth/Sex: Treating RN: May 14, 1955 (67 y.o. F) Primary Care Provider: [name] Other Clinician: Referring Provider: Treating Provider/Extender: [name] in Treatment: 4 Constitutional Hypertensive, asymptomatic. . . . no acute distress. Respiratory Normal work of breathing on room air. Notes PROMISS, LABARBERA ([id number]) [id number] Page 5 of 12 02/20/2022: The wound is cleaner again today. It is also measuring a little bit smaller, but there has been some moisture related periwound breakdown. Edema control is good. Electronic Signature(s) Signed: 02/20/2022 10:14:45 AM By: [name] MD FACS Entered By: [name] on 02/20/2022 10:14:45 -------------------------------------------------------------------------------- Physician Orders Details Patient Name: Date of Service: [name] [name]. 02/20/2022 9:30 A M Medical Record Number: [id number] Patient Account Number: [id number] Date of Birth/Sex: Treating RN: 05-29-1955 (67 y.o. 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 - Dr. Celine Ahr Room 3 Return A Anesthetic (In clinic) Topical Lidocaine 5% applied to wound bed Bathing/ Shower/ Hygiene May shower with protection but do not get wound dressing(s) wet. Protect dressing(s) with water repellant cover (for example, large plastic bag) or a cast cover and may then take shower. Edema Control - Lymphedema / SCD / Other Avoid standing for long periods of time. Patient to wear own compression stockings every day. Exercise regularly Wound Treatment Wound #7 - Lower Leg Wound Laterality: Left, Anterior Cleanser: Soap and Water 1 x Per Week/30 Days Discharge Instructions: May shower and wash wound with dial antibacterial soap and water prior to dressing change. Cleanser: Wound Cleanser 1 x Per Week/30 Days Discharge Instructions: Cleanse the wound with wound cleanser prior to applying a clean dressing using gauze sponges, not tissue or cotton balls. Peri-Wound Care: Zinc Oxide Ointment 30g tube 1 x Per Week/30 Days Discharge Instructions: Apply Zinc Oxide to periwound with each dressing change Prim Dressing: IODOFLEX 0.9% Cadexomer Iodine Pad 4x6 cm 1 x Per Week/30 Days ary Discharge Instructions: Apply to wound bed as instructed Secondary Dressing: ABD Pad, 5x9 1 x Per Week/30 Days Discharge Instructions: Apply over primary dressing as directed. Secondary Dressing: Woven Gauze Sponge, Non-Sterile 4x4 in 1 x Per Week/30 Days Discharge Instructions: Apply  over primary dressing as directed. Secured With: Transpore Surgical Tape, 2x10 (in/yd) 1 x Per Week/30 Days Discharge Instructions: Secure dressing with tape as directed. Compression Wrap: FourPress (4 layer compression wrap) 1 x Per Week/30 Days Discharge Instructions: Apply four layer compression as directed. May also use  Miliken CoFlex 2 layer compression system as alternative. SAPHRONIA, OZDEMIR ([id number]) [id number] Page 6 of 12 Electronic Signature(s) Signed: 02/20/2022 11:56:27 AM By: [name] MD FACS Entered By: [name] on 02/20/2022 10:15:14 -------------------------------------------------------------------------------- Problem List Details Patient Name: Date of Service: [name] [name]. 02/20/2022 9:30 A M Medical Record Number: [id number] Patient Account Number: [id number] Date of Birth/Sex: Treating RN: 09-09-1955 (67 y.o. 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 ERIENNE, SPELMAN [name]. 02/20/2022 9:30 A Rulon Eisenmenger, Doroteo Bradford D ([id number]) [id number] Page 11  of 12 Medical Record Number: [id number] Patient Account Number: [id number] Date of Birth/Sex: Treating RN: 1955-11-28 (67 y.o. F) Primary Care Provider: [name] Other Clinician: Referring Provider: Treating Provider/Extender: [name] in Treatment: 4 Information Obtained From Patient Constitutional Symptoms (General Health) Medical History: Past Medical History Notes: morbid obesity Eyes Medical History: Positive for: Cataracts Hematologic/Lymphatic Medical History: Positive for: Lymphedema Cardiovascular Medical History: Positive for: Hypertension; Peripheral Venous Disease Past Medical History Notes: schamberg disease, hyperlipidemia Gastrointestinal Medical History: Past Medical History Notes: diverticulitis , h/o obstruction due to diverticulitis , colostomy and colostomy reversal Endocrine Medical History: Past Medical History Notes: hypothyroidism Integumentary (Skin) Medical History: Negative for: History of Burn Musculoskeletal Medical History: Positive for: Osteoarthritis HBO Extended History Items Eyes: Cataracts Immunizations Pneumococcal Vaccine: Received Pneumococcal Vaccination: Yes Received Pneumococcal Vaccination On or After 60th Birthday: Yes Implantable Devices None Hospitalization / Surgery History Type of Hospitalization/Surgery colostomy reversal 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 Family and Social History Cancer: No; Diabetes: No; Heart Disease: Yes - Mother,Father; Hereditary Spherocytosis: No; Hypertension: Yes - Mother,Father; Kidney Disease: Yes - Mother; Lung  Disease: No; Seizures: No; Stroke: No; Thyroid Problems: Yes - Mother; Tuberculosis: No; Never smoker; Marital Status - Single; Alcohol Use: Never; Drug Use: No History; Caffeine Use: Daily - coffee; Financial Concerns: No; Food, Clothing or Shelter Needs: No; Support System Lacking: No; Transportation Concerns: No JORDYAN, HARDIMAN ([id number])  [id number] Page 12 of 12 Electronic Signature(s) Signed: 02/20/2022 11:56:27 AM By: [name] MD FACS Entered By: [name] on 02/20/2022 10:14:22 -------------------------------------------------------------------------------- SuperBill Details Patient Name: Date of Service: [name] [name]. 02/20/2022 Medical Record Number: [id number] Patient Account Number: [id number] Date of Birth/Sex: Treating RN: 1956-01-05 (67 y.o. 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 with other spec Modifier: ified severity Quantity: 1 Physician Procedures : CPT4 Code Description Modifier [id number] 77412 - WC PHYS LEVEL 3 - EST PT 25 ICD-10 Diagnosis Description L97.928 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

## 2022-02-20 NOTE — Progress Notes (Signed)
MARCI, POLITO ([id number]) [id number] Page 1 of 7 Visit Report for 02/20/2022 Arrival Information Details Patient Name: Date of Service: [name], [name] 02/20/2022 9:30 A M Medical Record Number: [id number] Patient Account Number: [id number] Date of Birth/Sex: Treating RN: [birth year] (67 y.o. F) Primary Care Joneisha Miles: [name] Other Clinician: Referring Shaune Westfall: Treating Jahyra Sukup/Extender: [name] in Treatment: 4 Visit Information History Since Last Visit All ordered tests and consults were completed: No Patient Arrived: [name] Added or deleted any medications: No Arrival Time: 09:46 Any new allergies or adverse reactions: No Accompanied By: self Had a fall or experienced change in No Transfer Assistance: None activities of daily living that may affect Patient Identification Verified: Yes risk of falls: Secondary Verification Process Completed: Yes Signs or symptoms of abuse/neglect since last visito No Hospitalized since last visit: No Implantable device outside of the clinic excluding No cellular tissue based products placed in the center since last visit: Pain Present Now: No Electronic Signature(s) Signed: 02/20/2022 11:36:33 AM By: Worthy Rancher Entered By: Worthy Rancher on 02/20/2022 09:47:15 -------------------------------------------------------------------------------- Compression Therapy Details Patient Name: Date of Service: [name], [name]. 02/20/2022 9:30 A M Medical Record Number: [id number] Patient Account Number: [id number] Date of Birth/Sex: Treating RN: November 29, 1955 (67 y.o. [name] Primary Care Cloyde Oregel: [name] Other Clinician: Referring Nozomi Mettler: Treating Aedyn Kempfer/Extender: [name] in Treatment: 4 Compression Therapy Performed for Wound Assessment: Wound #7 Left,Anterior Lower Leg Performed By: Clinician [name], RN Compression Type: Four  Layer Post Procedure Diagnosis Same as Pre-procedure Electronic Signature(s) Signed: 02/20/2022 4:07:41 PM By: [name] RN Entered By: [name] on 02/20/2022 10:14:08 Derrill Memo D ([id number]) [id number] Page 2 of 7 -------------------------------------------------------------------------------- Encounter Discharge Information Details Patient Name: Date of Service: [name], [name] 02/20/2022 9:30 A M Medical Record Number: [id number] Patient Account Number: [id number] Date of Birth/Sex: Treating RN: [birth year] (67 y.o. [name] Primary Care Hairo Garraway: [name] Other Clinician: Referring Kazandra Forstrom: Treating Kaiyla Stahly/Extender: [name] in Treatment: 4 Encounter Discharge Information Items Post Procedure Vitals Discharge Condition: Stable Temperature (F): 98.2 Ambulatory Status: Cane Pulse (bpm): 80 Discharge Destination: Home Respiratory Rate (breaths/min): 20 Transportation: Private Auto Blood Pressure (mmHg): 166/84 Accompanied By: self Schedule Follow-up Appointment: Yes Clinical Summary of Care: Patient Declined Electronic Signature(s) Signed: 02/20/2022 4:07:41 PM By: [name] RN Entered By: [name] on 02/20/2022 16:07:15 -------------------------------------------------------------------------------- Lower Extremity Assessment Details Patient Name: Date of Service: [name], [name]. 02/20/2022 9:30 A M Medical Record Number: [id number] Patient Account Number: [id number] Date of Birth/Sex: Treating RN: 1955/05/03 (67 y.o. [name] Primary Care Saori Umholtz: [name] Other Clinician: Referring Katelind Pytel: Treating Jerre Diguglielmo/Extender: [name] in Treatment: 4 Edema Assessment Assessed: [Left: No] [Right: No] [Left: Edema] [Right: :] Calf Left: Right: Point of Measurement: From Medial Instep 39 cm Ankle Left: Right: Point of  Measurement: From Medial Instep 20.8 cm Vascular Assessment Pulses: Dorsalis Pedis Palpable: [Left:Yes] Electronic Signature(s) Signed: 02/20/2022 4:07:41 PM By: [name] RN Entered By: [name] on 02/20/2022 09:59:50 Multi Wound Chart Details -------------------------------------------------------------------------------- [name] ([id number]) [id number] Page 3 of 7 Patient Name: Date of Service: [name], [name] 02/20/2022 9:30 A M Medical Record Number: [id number] Patient Account Number: [id number] Date of Birth/Sex: Treating RN: [birth year] (67 y.o. F) Primary Care Jeffery Gammell: [name] Other Clinician: Referring Natalya Domzalski: Treating Ailyne Pawley/Extender: [name] in Treatment: 4 Vital Signs Height(in): 62 Pulse(bpm): 80 Weight(lbs): 671 Blood Pressure(mmHg): 166/84  Body Mass Index(BMI): 40.6 Temperature(F): 98.2 Respiratory Rate(breaths/min): 20 Wound Assessments Wound Number: 7 N/A N/A Photos: N/A N/A Left, Anterior Lower Leg N/A N/A Wound Location: Blister N/A N/A Wounding Event: Lymphedema N/A N/A Primary Etiology: Cataracts, Lymphedema, N/A N/A Comorbid History: Hypertension, Peripheral Venous Disease, Osteoarthritis 01/22/2022 N/A N/A Date Acquired: 4 N/A N/A Weeks of Treatment: Open N/A N/A Wound Status: No N/A N/A Wound Recurrence: 2x2.2x0.2 N/A N/A Measurements L x W x D (cm) 3.456 N/A N/A A (cm) : rea 0.691 N/A N/A Volume (cm) : -11.10% N/A N/A % Reduction in Area: -11.10% N/A N/A % Reduction in Volume: Full Thickness Without Exposed N/A N/A Classification: Support Structures Medium N/A N/A Exudate Amount: Serosanguineous N/A N/A Exudate Type: red, brown N/A N/A Exudate Color: Distinct, outline attached N/A N/A Wound Margin: Medium (34-66%) N/A N/A Granulation Amount: Red N/A N/A Granulation Quality: Medium (34-66%) N/A N/A Necrotic Amount: Fat Layer  (Subcutaneous Tissue): Yes N/A N/A Exposed Structures: Fascia: No Tendon: No Muscle: No Joint: No Bone: No Small (1-33%) N/A N/A Epithelialization: No Abnormalities Noted N/A N/A Periwound Skin Texture: Dry/Scaly: Yes N/A N/A Periwound Skin Moisture: Erythema: Yes N/A N/A Periwound Skin Color: Circumferential N/A N/A Erythema Location: No Abnormality N/A N/A Temperature: Treatment Notes Electronic Signature(s) Signed: 02/20/2022 10:12:13 AM By: [name] MD FACS Entered By: [name] on 02/20/2022 10:12:13 Multi-Disciplinary Care Plan Details -------------------------------------------------------------------------------- [name] ([id number]) [phone].pdf Page 4 of 7 Patient Name: Date of Service: [name], [name] 02/20/2022 9:30 A M Medical Record Number: [id number] Patient Account Number: [id number] Date of Birth/Sex: Treating RN: Jun 08, 1955 (67 y.o. [name] Primary Care Jezebelle Ledwell: [name] Other Clinician: Referring Jalaysha Skilton: Treating Khaleel Beckom/Extender: [name] in Treatment: 4 Active Inactive Abuse / Safety / Falls / Self Care Management Nursing Diagnoses: History of Falls Impaired physical mobility Goals: Patient/caregiver will identify factors that restrict self-care and home management Date Initiated: 01/22/2022 Target Resolution Date: 03/23/2022 Goal Status: Active Interventions: Assess fall risk on admission and as needed Assess: immobility, friction, shearing, incontinence upon admission and as needed Notes: Wound/Skin Impairment Nursing Diagnoses: Impaired tissue integrity Knowledge deficit related to ulceration/compromised skin integrity Goals: Patient/caregiver will verbalize understanding of skin care regimen Date Initiated: 01/22/2022 Target Resolution Date: 03/23/2022 Goal Status: Active Interventions: Assess ulceration(s) every visit Treatment  Activities: Skin care regimen initiated : 01/22/2022 Topical wound management initiated : 01/22/2022 Notes: Electronic Signature(s) Signed: 02/20/2022 4:07:41 PM By: [name] RN Entered By: [name] on 02/20/2022 16:06:05 -------------------------------------------------------------------------------- Pain Assessment Details Patient Name: Date of Service: Erin Face D. 02/20/2022 9:30 A M Medical Record Number: [id number] Patient Account Number: [id number] Date of Birth/Sex: Treating RN: [birth year] (67 y.o. F) Primary Care Nickole Adamek: [name] Other Clinician: Referring Lazlo Tunney: Treating Veena Sturgess/Extender: [name] in Treatment: 4 Active Problems Location of Pain Severity and Description of Pain Patient Has Paino No Site Locations [place], [place] D ([id number]) [id number] Page 5 of 7 Pain Management and Medication Current Pain Management: Electronic Signature(s) Signed: 02/20/2022 11:36:33 AM By: Worthy Rancher Entered By: Worthy Rancher on 02/20/2022 09:47:52 -------------------------------------------------------------------------------- Patient/Caregiver Education Details Patient Name: Date of Service: [name] 1/9/2024andnbsp9:30 A M Medical Record Number: [id number] Patient Account Number: [id number] Date of Birth/Gender: Treating RN: 1955/06/21 (67 y.o. [name] Primary Care Physician: [name] Other Clinician: Referring Physician: Treating Physician/Extender: [name] in Treatment: 4 Education Assessment Education Provided To: Patient Education Topics Provided Wound/Skin Impairment: Methods: Explain/Verbal Responses: Return demonstration correctly  Electronic Signature(s) Signed: 02/20/2022 4:07:41 PM By: [name] RN Entered By: [name] on 02/20/2022  16:06:20 -------------------------------------------------------------------------------- Wound Assessment Details Patient Name: Date of Service: [name], [name]. 02/20/2022 9:30 A M Medical Record Number: [id number] Patient Account Number: [id number] Date of Birth/Sex: Treating RN: [birth year] (67 y.o. [name] Primary Care Demetres Prochnow: [name] Other Clinician: Referring Maiyah Goyne: Treating Elliannah Wayment/Extender: Namiko, Pritt[place] D ([id number]) [id number] Page 6 of 7 Weeks in Treatment: 4 Wound Status Wound Number: 7 Primary Lymphedema Etiology: Wound Location: Left, Anterior Lower Leg Wound Open Wounding Event: Blister Status: Date Acquired: 01/22/2022 Comorbid Cataracts, Lymphedema, Hypertension, Peripheral Venous Weeks Of Treatment: 4 History: Disease, Osteoarthritis Clustered Wound: No Photos Wound Measurements Length: (cm) 2 Width: (cm) 2.2 Depth: (cm) 0.2 Area: (cm) 3.456 Volume: (cm) 0.691 % Reduction in Area: -11.1% % Reduction in Volume: -11.1% Epithelialization: Small (1-33%) Tunneling: No Undermining: No Wound Description Classification: Full Thickness Without Exposed Support Structures Wound Margin: Distinct, outline attached Exudate Amount: Medium Exudate Type: Serosanguineous Exudate Color: red, brown Foul Odor After Cleansing: No Slough/Fibrino Yes Wound Bed Granulation Amount: Medium (34-66%) Exposed Structure Granulation Quality: Red Fascia Exposed: No Necrotic Amount: Medium (34-66%) Fat Layer (Subcutaneous Tissue) Exposed: Yes Necrotic Quality: Adherent Slough Tendon Exposed: No Muscle Exposed: No Joint Exposed: No Bone Exposed: No Periwound Skin Texture Texture Color No Abnormalities Noted: Yes No Abnormalities Noted: No Erythema: Yes Moisture Erythema Location: Circumferential No Abnormalities Noted: No Dry / Scaly: Yes Temperature / Pain Temperature: No  Abnormality Treatment Notes Wound #7 (Lower Leg) Wound Laterality: Left, Anterior Cleanser Soap and Water Discharge Instruction: May shower and wash wound with dial antibacterial soap and water prior to dressing change. Wound Cleanser Discharge Instruction: Cleanse the wound with wound cleanser prior to applying a clean dressing using gauze sponges, not tissue or cotton balls. Peri-Wound Care Zinc Oxide Ointment 30g tube Discharge Instruction: Apply Zinc Oxide to periwound with each dressing change Topical Primary Dressing IODOFLEX 0.9% Cadexomer Iodine Pad 4x6 cm [name], [name] ([id number]) [id number] Page 7 of 7 Discharge Instruction: Apply to wound bed as instructed Secondary Dressing ABD Pad, 5x9 Discharge Instruction: Apply over primary dressing as directed. Woven Gauze Sponge, Non-Sterile 4x4 in Discharge Instruction: Apply over primary dressing as directed. Secured With Transpore Surgical Tape, 2x10 (in/yd) Discharge Instruction: Secure dressing with tape as directed. Compression Wrap FourPress (4 layer compression wrap) Discharge Instruction: Apply four layer compression as directed. May also use Miliken CoFlex 2 layer compression system as alternative. Compression Stockings Add-Ons Electronic Signature(s) Signed: 02/20/2022 4:07:41 PM By: [name] RN Entered By: [name] on 02/20/2022 10:04:51 -------------------------------------------------------------------------------- Vitals Details Patient Name: Date of Service: Erin Gerold [name] 02/20/2022 9:30 A M Medical Record Number: [id number] Patient Account Number: [id number] Date of Birth/Sex: Treating RN: 05/08/1955 (67 y.o. F) Primary Care Kamonte Mcmichen: [name] Other Clinician: Referring Tabitha Tupper: Treating Elmo Shumard/Extender: [name] in Treatment: 4 Vital Signs Time Taken: 09:47 Temperature (F): 98.2 Height (in): 62 Pulse (bpm):  80 Weight (lbs): 222 Respiratory Rate (breaths/min): 20 Body Mass Index (BMI): 40.6 Blood Pressure (mmHg): 166/84 Reference Range: 80 - 120 mg / dl Electronic Signature(s) Signed: 02/20/2022 11:36:33 AM By: Worthy Rancher Entered By: Worthy Rancher on 02/20/2022 09:47:43

## 2022-02-24 ENCOUNTER — Other Ambulatory Visit: Payer: Self-pay

## 2022-02-27 ENCOUNTER — Encounter (HOSPITAL_BASED_OUTPATIENT_CLINIC_OR_DEPARTMENT_OTHER): Payer: Commercial Managed Care - PPO | Admitting: General Surgery

## 2022-02-27 DIAGNOSIS — I872 Venous insufficiency (chronic) (peripheral): Secondary | ICD-10-CM | POA: Diagnosis not present

## 2022-02-27 DIAGNOSIS — I890 Lymphedema, not elsewhere classified: Secondary | ICD-10-CM | POA: Diagnosis not present

## 2022-02-27 DIAGNOSIS — I739 Peripheral vascular disease, unspecified: Secondary | ICD-10-CM | POA: Diagnosis not present

## 2022-02-27 DIAGNOSIS — L97928 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: Secondary | ICD-10-CM | POA: Diagnosis not present

## 2022-02-27 DIAGNOSIS — E039 Hypothyroidism, unspecified: Secondary | ICD-10-CM | POA: Diagnosis not present

## 2022-02-27 NOTE — Progress Notes (Signed)
IDALIE, CANTO ([id number]) [id number] Page 1 of 7 Visit Report for 02/27/2022 Arrival Information Details Patient Name: Date of Service: [name], [name] 02/27/2022 9:30 A M Medical Record Number: [id number] Patient Account Number: [id number] Date of Birth/Sex: Treating RN: [birth year] (67 y.o. [name] Primary Care Naraly Fritcher: [name] Other Clinician: Referring Cort Dragoo: Treating Hagen Tidd/Extender: [name] in Treatment: 5 Visit Information History Since Last Visit Added or deleted any medications: No Patient Arrived: [name] Any new allergies or adverse reactions: No Arrival Time: 09:40 Had a fall or experienced change in No Accompanied By: self activities of daily living that may affect Transfer Assistance: Manual risk of falls: Patient Identification Verified: Yes Signs or symptoms of abuse/neglect since last visito No Hospitalized since last visit: No Implantable device outside of the clinic excluding No cellular tissue based products placed in the center since last visit: Has Dressing in Place as Prescribed: Yes Has Compression in Place as Prescribed: Yes Pain Present Now: No Electronic Signature(s) Signed: 02/27/2022 4:37:52 PM By: [name] RN Entered By: [name] on 02/27/2022 09:58:05 -------------------------------------------------------------------------------- Compression Therapy Details Patient Name: Date of Service: Erin Face [name] 02/27/2022 9:30 A M Medical Record Number: [id number] Patient Account Number: [id number] Date of Birth/Sex: Treating RN: 18-Jan-1956 (67 y.o. [name] Primary Care Lizzett Nobile: [name] Other Clinician: Referring Tallin Hart: Treating Alphonsus Doyel/Extender: [name] in Treatment: 5 Compression Therapy Performed for Wound Assessment: Wound #7 Left,Anterior Lower Leg Performed By: Clinician [name],  RN Compression Type: Four Layer Post Procedure Diagnosis Same as Pre-procedure Electronic Signature(s) Signed: 02/27/2022 4:37:52 PM By: [name] RN Entered By: [name] on 02/27/2022 10:14:48 [name], [name] ([id number]) [id number] Page 2 of 7 -------------------------------------------------------------------------------- Encounter Discharge Information Details Patient Name: Date of Service: [name] 02/27/2022 9:30 A M Medical Record Number: [id number] Patient Account Number: [id number] Date of Birth/Sex: Treating RN: [birth year] (67 y.o. [name] Primary Care Pam Vanalstine: [name] Other Clinician: Referring Karee Forge: Treating Teal Raben/Extender: [name] in Treatment: 5 Encounter Discharge Information Items Post Procedure Vitals Discharge Condition: Stable Temperature (F): 97.9 Ambulatory Status: Cane Pulse (bpm): 66 Discharge Destination: Home Respiratory Rate (breaths/min): 18 Transportation: Private Auto Blood Pressure (mmHg): 134/79 Accompanied By: self Schedule Follow-up Appointment: Yes Clinical Summary of Care: Patient Declined Electronic Signature(s) Signed: 02/27/2022 4:37:52 PM By: [name] RN Entered By: [name] on 02/27/2022 16:35:20 -------------------------------------------------------------------------------- Lower Extremity Assessment Details Patient Name: Date of Service: [name] 02/27/2022 9:30 A M Medical Record Number: [id number] Patient Account Number: [id number] Date of Birth/Sex: Treating RN: 20-May-1955 (67 y.o. [name] Primary Care Karsen Fellows: [name] Other Clinician: Referring Keyli Duross: Treating Indiana Pechacek/Extender: [name] in Treatment: 5 Edema Assessment Assessed: [Left: No] [Right: No] [Left: Edema] [Right: :] Calf Left: Right: Point of Measurement: From Medial Instep 40.2  cm Ankle Left: Right: Point of Measurement: From Medial Instep 21 cm Vascular Assessment Pulses: Dorsalis Pedis Palpable: [Left:Yes] Electronic Signature(s) Signed: 02/27/2022 4:37:52 PM By: [name] RN Entered By: [name] on 02/27/2022 09:58:51 Multi Wound Chart Details -------------------------------------------------------------------------------- [name] ([id number]) [id number] Page 3 of 7 Patient Name: Date of Service: [name], [name] 02/27/2022 9:30 A M Medical Record Number: [id number] Patient Account Number: [id number] Date of Birth/Sex: Treating RN: 10-Jun-1955 (67 y.o. F) Primary Care Dickson Kostelnik: [name] Other Clinician: Referring Briena Swingler: Treating Kimbery Harwood/Extender: [name] in Treatment: 5 Vital Signs Height(in): 62 Pulse(bpm): 66 Weight(lbs):  222 Blood Pressure(mmHg): 134/79 Body Mass Index(BMI): 40.6 Temperature(F): 97.9 Respiratory Rate(breaths/min): 18 Wound Assessments Wound Number: 7 N/A N/A Photos: N/A N/A Left, Anterior Lower Leg N/A N/A Wound Location: Blister N/A N/A Wounding Event: Lymphedema N/A N/A Primary Etiology: Cataracts, Lymphedema, N/A N/A Comorbid History: Hypertension, Peripheral Venous Disease, Osteoarthritis 01/22/2022 N/A N/A Date Acquired: 5 N/A N/A Weeks of Treatment: Open N/A N/A Wound Status: No N/A N/A Wound Recurrence: 3.5x3x0.2 N/A N/A Measurements L x W x [name] 8.247 N/A N/A A (cm) : rea 1.649 N/A N/A Volume (cm) : -165.20% N/A N/A % Reduction in A rea: -165.10% N/A N/A % Reduction in Volume: Full Thickness Without Exposed N/A N/A Classification: Support Structures Medium N/A N/A Exudate A mount: Serosanguineous N/A N/A Exudate Type: red, [name] N/A N/A Exudate Color: Distinct, outline attached N/A N/A Wound Margin: Medium (34-66%) N/A N/A Granulation A mount: Red N/A N/A Granulation Quality: Medium  (34-66%) N/A N/A Necrotic A mount: Fat Layer (Subcutaneous Tissue): Yes N/A N/A Exposed Structures: Fascia: No Tendon: No Muscle: No Joint: No Bone: No Small (1-33%) N/A N/A Epithelialization: Debridement - Excisional N/A N/A Debridement: Pre-procedure Verification/Time Out 10:05 N/A N/A Taken: Lidocaine 5% topical ointment N/A N/A Pain Control: Subcutaneous, Slough N/A N/A Tissue Debrided: Skin/Subcutaneous Tissue N/A N/A Level: 10.5 N/A N/A Debridement A (sq cm): rea Curette N/A N/A Instrument: Minimum N/A N/A Bleeding: Pressure N/A N/A Hemostasis A chieved: 0 N/A N/A Procedural Pain: 0 N/A N/A Post Procedural Pain: Procedure was tolerated well N/A N/A Debridement Treatment Response: 3.5x3x0.2 N/A N/A Post Debridement Measurements L x W x [name] 1.649 N/A N/A Post Debridement Volume: (cm) No Abnormalities Noted N/A N/A Periwound Skin Texture: Maceration: Yes N/A N/A Periwound Skin Moisture: Dry/Scaly: No Erythema: Yes N/A N/A Periwound Skin Color: Circumferential N/A N/A Erythema Location: No Abnormality N/A N/A Temperature: Compression Therapy N/A N/A Procedures Performed: Debridement KASHAY, CAVENAUGH ([id number]) [id number] Page 4 of 7 Treatment Notes Electronic Signature(s) Signed: 02/27/2022 10:47:15 AM By: [name] MD FACS Entered By: [name] on 02/27/2022 10:47:15 -------------------------------------------------------------------------------- Multi-Disciplinary Care Plan Details Patient Name: Date of Service: Erin Face [name] 02/27/2022 9:30 A M Medical Record Number: [id number] Patient Account Number: [id number] Date of Birth/Sex: Treating RN: [birth year] (67 y.o. [name] Primary Care Forrester Blando: [name] Other Clinician: Referring Sullivan Blasing: Treating Treavor Blomquist/Extender: [name] in Treatment: 5 Active Inactive Abuse / Safety / Falls / Self Care  Management Nursing Diagnoses: History of Falls Impaired physical mobility Goals: Patient/caregiver will identify factors that restrict self-care and home management Date Initiated: 01/22/2022 Target Resolution Date: 03/23/2022 Goal Status: Active Interventions: Assess fall risk on admission and as needed Assess: immobility, friction, shearing, incontinence upon admission and as needed Notes: Wound/Skin Impairment Nursing Diagnoses: Impaired tissue integrity Knowledge deficit related to ulceration/compromised skin integrity Goals: Patient/caregiver will verbalize understanding of skin care regimen Date Initiated: 01/22/2022 Target Resolution Date: 03/23/2022 Goal Status: Active Interventions: Assess ulceration(s) every visit Treatment Activities: Skin care regimen initiated : 01/22/2022 Topical wound management initiated : 01/22/2022 Notes: Electronic Signature(s) Signed: 02/27/2022 4:37:52 PM By: [name] RN Entered By: [name] on 02/27/2022 16:34:08 Derrill Memo [name] ([id number]) [id number] Page 5 of 7 -------------------------------------------------------------------------------- Pain Assessment Details Patient Name: Date of Service: [name], [name] 02/27/2022 9:30 A M Medical Record Number: [id number] Patient Account Number: [id number] Date of Birth/Sex: Treating RN: Jun 20, 1955 (67 y.o. [name] Primary Care Freddie Nghiem: [name] Other Clinician: Referring Zhavia Cunanan: Treating Chinara Hertzberg/Extender: [name] in  Treatment: 5 Active Problems Location of Pain Severity and Description of Pain Patient Has Paino No Site Locations Pain Management and Medication Current Pain Management: Electronic Signature(s) Signed: 02/27/2022 4:37:52 PM By: [name] RN Entered By: [name] on 02/27/2022  09:58:36 -------------------------------------------------------------------------------- Patient/Caregiver Education Details Patient Name: Date of Service: [name] 1/16/2024andnbsp9:30 Ginger Blue Record Number: [id number] Patient Account Number: [id number] Date of Birth/Gender: Treating RN: 07-25-1955 (67 y.o. [name] Primary Care Physician: [name] Other Clinician: Referring Physician: Treating Physician/Extender: [name] in Treatment: 5 Education Assessment Education Provided To: Patient Education Topics Provided Wound/Skin Impairment: Methods: Explain/Verbal Responses: Return demonstration correctly [name], [name] ([id number]) [phone].pdf Page 6 of 7 Electronic Signature(s) Signed: 02/27/2022 4:37:52 PM By: [name] RN Entered By: [name] on 02/27/2022 16:34:24 -------------------------------------------------------------------------------- Wound Assessment Details Patient Name: Date of Service: [name], [name] 02/27/2022 9:30 A M Medical Record Number: [id number] Patient Account Number: [id number] Date of Birth/Sex: Treating RN: April 15, 1955 (67 y.o. [name] Primary Care Shealyn Sean: [name] Other Clinician: Referring Loise Esguerra: Treating Kalum Minner/Extender: [name] in Treatment: 5 Wound Status Wound Number: 7 Primary Lymphedema Etiology: Wound Location: Left, Anterior Lower Leg Wound Open Wounding Event: Blister Status: Date Acquired: 01/22/2022 Comorbid Cataracts, Lymphedema, Hypertension, Peripheral Venous Weeks Of Treatment: 5 History: Disease, Osteoarthritis Clustered Wound: No Photos Wound Measurements Length: (cm) 3.5 Width: (cm) 3 Depth: (cm) 0.2 Area: (cm) 8.247 Volume: (cm) 1.649 % Reduction in Area: -165.2% % Reduction in Volume: -165.1% Epithelialization: Small (1-33%) Undermining: No Wound  Description Classification: Full Thickness Without Exposed Suppor Wound Margin: Distinct, outline attached Exudate Amount: Medium Exudate Type: Serosanguineous Exudate Color: red, [name] t Structures Foul Odor After Cleansing: No Slough/Fibrino Yes Wound Bed Granulation Amount: Medium (34-66%) Exposed Structure Granulation Quality: Red Fascia Exposed: No Necrotic Amount: Medium (34-66%) Fat Layer (Subcutaneous Tissue) Exposed: Yes Necrotic Quality: Adherent Slough Tendon Exposed: No Muscle Exposed: No Joint Exposed: No Bone Exposed: No Periwound Skin Texture Texture Color No Abnormalities Noted: Yes No Abnormalities Noted: No Erythema: Yes Moisture Erythema Location: Circumferential No Abnormalities Noted: No Dry / Scaly: No Temperature / Pain [name], [name] ([id number]) [id number] Page 7 of 7 Maceration: Yes Temperature: No Abnormality Treatment Notes Wound #7 (Lower Leg) Wound Laterality: Left, Anterior Cleanser Soap and Water Discharge Instruction: May shower and wash wound with dial antibacterial soap and water prior to dressing change. Wound Cleanser Discharge Instruction: Cleanse the wound with wound cleanser prior to applying a clean dressing using gauze sponges, not tissue or cotton balls. Peri-Wound Care Zinc Oxide Ointment 30g tube Discharge Instruction: Apply Zinc Oxide to periwound with each dressing change Topical Primary Dressing IODOFLEX 0.9% Cadexomer Iodine Pad 4x6 cm Discharge Instruction: Apply to wound bed as instructed Secondary Dressing Woven Gauze Sponge, Non-Sterile 4x4 in Discharge Instruction: Apply over primary dressing as directed. Zetuvit Plus 4x8 in Discharge Instruction: Apply over primary dressing as directed. Secured With Transpore Surgical Tape, 2x10 (in/yd) Discharge Instruction: Secure dressing with tape as directed. Compression Wrap FourPress (4 layer compression wrap) Discharge Instruction: Apply  four layer compression as directed. May also use Miliken CoFlex 2 layer compression system as alternative. Compression Stockings Add-Ons Electronic Signature(s) Signed: 02/27/2022 4:37:52 PM By: [name] RN Entered By: [name] on 02/27/2022 10:01:08 -------------------------------------------------------------------------------- Vitals Details Patient Name: Date of Service: Erin Gerold NITA [name] 02/27/2022 9:30 A M Medical Record Number: [id number] Patient Account Number: [id number] Date of Birth/Sex: Treating RN: 10-07-1955 (67 y.o. F) [name], [name]  Primary Care Sandria Mcenroe: [name] Other Clinician: Referring Marielle Mantione: Treating Reighan Hipolito/Extender: [name] in Treatment: 5 Vital Signs Time Taken: 09:42 Temperature (F): 97.9 Height (in): 62 Pulse (bpm): 66 Weight (lbs): 222 Respiratory Rate (breaths/min): 18 Body Mass Index (BMI): 40.6 Blood Pressure (mmHg): 134/79 Reference Range: 80 - 120 mg / dl Electronic Signature(s) Signed: 02/27/2022 4:37:52 PM By: [name] RN Entered By: [name] on 02/27/2022 09:58:28

## 2022-02-27 NOTE — Progress Notes (Signed)
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 this ended up being crusted on Iodoflex. There is still some periwound moisture. Still with thick slough accumulation. Edema control is good. I used a  curette to debride slough and nonviable subcutaneous tissue from the wound. I think another week of Iodoflex will be beneficial. Due to the periwound moisture, we will use zinc as well as a Zetuvit pad rather than an ABD. Continue 4-layer compression. Follow-up in 1 week. Electronic Signature(s) Signed: 02/27/2022 10:50:15 AM By: [name] MD FACS Entered By: [name] on 02/27/2022 10:50:15 [name], [name] ([id number]) [id number] Page 11 of 12 -------------------------------------------------------------------------------- HxROS Details Patient Name: Date of Service: [name], [name] 02/27/2022 9:30 A M Medical Record Number: [id number] Patient Account Number: [id number] Date of Birth/Sex: Treating RN: 09-21-1955 (67 y.o. F) Primary Care Provider: [name] Other Clinician: Referring Provider: Treating Provider/Extender: [name] in Treatment: 5 Information Obtained From Patient Constitutional Symptoms (General Health) Medical History: Past Medical History Notes: morbid obesity Eyes Medical History: Positive for: Cataracts Hematologic/Lymphatic Medical History: Positive for: Lymphedema Cardiovascular Medical History: Positive for: Hypertension; Peripheral Venous Disease Past Medical History Notes: schamberg disease, hyperlipidemia Gastrointestinal Medical History: Past Medical  History Notes: diverticulitis , h/o obstruction due to diverticulitis , colostomy and colostomy reversal Endocrine Medical History: Past Medical History Notes: hypothyroidism Integumentary (Skin) Medical History: Negative for: History of Burn Musculoskeletal Medical History: Positive for: Osteoarthritis HBO Extended History Items Eyes: Cataracts Immunizations Pneumococcal Vaccine: Received Pneumococcal Vaccination: Yes Received Pneumococcal Vaccination On or After 60th Birthday: Yes Implantable  Devices None Hospitalization / Surgery History [name], [name] ([id number]) [id number] Page 12 of 12 Type of Hospitalization/Surgery colostomy reversal 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 Family and Social History Cancer: No; Diabetes: No; Heart Disease: Yes - Mother,Father; Hereditary Spherocytosis: No; Hypertension: Yes - Mother,Father; Kidney Disease: Yes - Mother; Lung Disease: No; Seizures: No; Stroke: No; Thyroid Problems: Yes - Mother; Tuberculosis: No; Never smoker; Marital Status - Single; Alcohol Use: Never; Drug Use: No History; Caffeine Use: Daily - coffee; Financial Concerns: No; Food, Clothing or Shelter Needs: No; Support System Lacking: No; Transportation Concerns: No Electronic Signature(s) Signed: 02/27/2022 11:02:56 AM By: [name] MD FACS Entered By: [name] on 02/27/2022 10:48:11 -------------------------------------------------------------------------------- SuperBill Details Patient Name: Date of Service: [name] [name] 02/27/2022 Medical Record Number: [id number] Patient Account Number: [id number] Date of Birth/Sex: Treating RN: 1956/01/26 (67 y.o. 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 with other spec Modifier: ified severity Quantity: 1 Physician Procedures : CPT4 Code Description Modifier [id number] - WC PHYS LEVEL 3 - EST PT 25 ICD-10 Diagnosis Description L97.928  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

## 2022-03-06 ENCOUNTER — Encounter (HOSPITAL_BASED_OUTPATIENT_CLINIC_OR_DEPARTMENT_OTHER): Payer: Commercial Managed Care - PPO | Admitting: General Surgery

## 2022-03-06 DIAGNOSIS — I872 Venous insufficiency (chronic) (peripheral): Secondary | ICD-10-CM | POA: Diagnosis not present

## 2022-03-06 DIAGNOSIS — L97928 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: Secondary | ICD-10-CM | POA: Diagnosis not present

## 2022-03-06 DIAGNOSIS — I739 Peripheral vascular disease, unspecified: Secondary | ICD-10-CM | POA: Diagnosis not present

## 2022-03-06 DIAGNOSIS — L97822 Non-pressure chronic ulcer of other part of left lower leg with fat layer exposed: Secondary | ICD-10-CM | POA: Diagnosis not present

## 2022-03-06 DIAGNOSIS — I890 Lymphedema, not elsewhere classified: Secondary | ICD-10-CM | POA: Diagnosis not present

## 2022-03-06 DIAGNOSIS — E039 Hypothyroidism, unspecified: Secondary | ICD-10-CM | POA: Diagnosis not present

## 2022-03-06 NOTE — Progress Notes (Signed)
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 Patient Account Number: [id number] Date of Birth/Sex: Treating RN: June 04, 1955 (67 y.o. 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 03/06/2022 9:30 A M Medical Record Number: [id number] Patient Account Number: [id number] Date of Birth/Sex: Treating RN: [birth year]  (67 y.o. F) Primary Care Provider: Gl[place]ale [name] Other Clinician: Referring Provider: Treating Provider/Extender: [name] in Treatment: 6 Subjective Chief Complaint Information obtained from Patient 04/19/2021: The patient is here for ongoing follow-up regarding 2 left lower extremity wounds. 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 She tells [name] that she fell on the gravel driveway in July. She developed injuries on her distal lower leg which have not healed. She saw her primary physician on 11/15/2017 who noted her left shin injuries. Gave her antibiotics. At that point the wounds were almost circumferential however most were less than 1.5 cm. Weeping edema fluid was noted. She was referred here for evaluation. The patient has a history of chronic lower extremity edema. 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 She has had prior history with leg weeping fluid. She does not wear compression stockings. She is not doing anything specific to these wound areas. 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 She also has lymphedema. Put her in 3 layer compression and silver alginate last week. She has 3 small wounds with depth just lateral to the tibia. More problematically than this she  has numerous shallow areas some  of which are almost canal like in shape with tightly adherent painful debris. It would be very difficult and time- consuming to go through this and attempt to individually debride all these areas.. I changed her to collagen today to see if that would help with any of the surface debris on some of these wounds. Otherwise we will not be able to put this in compression we have to have someone change the dressing. The patient is not eligible for home health 12/25/17 on evaluation today patient actually appears to be doing rather well in regard to the ulcer on her lower extremity. Fortunately there does not appear to be evidence of infection at this time. She has been tolerating the dressing changes without complication. This includes the compression wrap. The only issue she had was that the wrap was initially placed over her bunion region which actually calls her some discomfort and pain. Other than that things seem to be going rather well. 01/01/2018 Seen today for follow-up and management of left lower extremity wound and lymphedema. T oday she presents with a new wound towards to the left lateral LE. Recently treated with a 7 day course of amoxicillin; reason for antibiotic dose is unknown at this time. Tolerating current treatment of collagen with 4- layer wraps. She obtained a venous reflux study on 12/25/17. Studies show on the right abnormal reflux times of the popliteal vein, great saphenous vein at the saphenofemoral junction at the proximal thigh, great saphenous vein at the mid calf, and origin of the small saphenous vein.No superficial thrombosis. No deep vein thrombosis in the common femoral, femoral,and popliteal veins. Left abnormal reflex times as well of the common femoral vein, popliteal vein, and a great saphenous vein at the saphenofemoral junction, In great saphenous vein at the mid thigh w/o thrombosis. Has any issues or concerns during visit today. Recommended follow-up to vascular  specialist due to abnormalities from the venous reflux study. Denies fever, pain, chills, dizziness, nausea, or vomiting. 01/08/18 upon evaluation today the patient actually seems to be showing some signs of improvement in my opinion at this point in regard to the lower extremity ulcerated areas. She still has a lot of drainage but fortunately nothing that appears to be too significant currently. I have been very happy with the overall progress I see today compared to where things were during the last evaluation that I had with her. Nonetheless she has not had her appointment with the vein specialist as of yet in fact we were able to get this approved for her today and confirmed with them she will be seeing them on December 26. Nonetheless in general I do feel like the compression wraps is doing well for her. 01/17/18; quite a bit of improvement since last time I saw this patient she has a small open area remaining on the left lateral calf and even smaller area medially. She has lymphedema chronic stasis changes with distal skin fibrosis. She has an appointment with vascular surgery later this month 01/24/2018; the patient's medial leg has closed. Still a small open area on the left lateral leg. She states that the 4 layer compression we put on last week was too tight and she had to take it off over a few days ago. We have had resultant increase in her lymphedema in the dorsal foot and a proximal calf. Fortunately that does not seem to have resulted in any deterioration in her wounds The patient is going to  need compression stockings. We have given her measurements to phone elastic therapy in [place]. She has vascular surgery consult on December 26 02/07/18; she's had continuous contraction on the left lateral leg wound which is now very small. Currently using silver alginate under 3 layer compression She saw Dr. Trula Slade of vascular surgery on 02/06/18. It was noted that she had a normal reflux times  in the popliteal vein, great saphenous vein at the saphenofemoral junction, great saphenous vein at the proximal thigh great saphenous vein at the mid calf and origin of the small saphenous vein. It was noted that she had significant reflux in the left saphenous veins with diameter measurements in the 0.7-0.8 cm range. It was felt she would benefit from laser ablation to help minimize the risk of ulcer recurrence. It was recommended that she wear 20-30 thigh-high compression stockings and have follow-up in 4-6 weeks 02/17/2018; I thought this lady would be healed however her compression slipped down and she developed increasing swelling and the wound is actually larger. 1/13; we had deterioration last week after the patient's compression slipped down and she developed periwound swelling. I increased her compression before layers we have been using silver alginate we are a lot better again today. She has her compression stockings in waiting 1/23; the patient's wounds are totally healed today. She has her stockings. This was almost circumferential skin damage. She follows up with Dr. Trula Slade of vascular surgery next Monday. READMISSION 02/21/2021 This is a now 67 year old woman that we had in clinic here discharging in January 2020 with wounds on her left calf chronic venous insufficiency. She was discharged with 30/40 stockings. It does not sound like she has worn stockings in about a year largely from not being able to get them on herself. In November she developed new blisters on her legs an area laterally is opened into a fairly sizable wound. She has weeping posteriorly as well. She has been using Neosporin and Band-Aids. The patient did see Dr. Trula Slade in 2019 and 2020. He felt she might benefit from laser ablation in her left saphenous veins although because of the wound that healed I do not think he went through with it. She might benefit from seeing him again. Her ABI on the left is 1. 1/17;  patient's wound on the posterior left calf is closed she has the 2 large areas last week. We put her in 4-layer compression for edema control is a lot better. Our intake nurse noted greenish drainage and odor. We have been using silver alginate 1/25; PCR culture I did have the substantial wound area on the left lateral lower leg showed staff aureus and group A strep. Low titers of coag negative staph which are probably skin contaminants. Resistance detected to tetracycline methicillin and macrolides. I gave her a starter kit of Nuzyra 150 mg x 3 for 2 days then 300 mg for a further 7 days. Marked odor considerable increase in surrounding erythema. We are using Iodoflex last week however I have changed to silver alginate with underlying Bactroban 2/1; she is completing her [place] tomorrow. The degree of erythema around the wounds looks a lot better. Odor has improved. I gave her silver alginate and Bactroban last week and changing her back to Iodoflex to continue with ongoing debridement 2/8; the periwound looks a lot better. Surface of the wound also looks somewhat better although there is still ongoing debridement to be done we have been using Iodoflex under compression. Primary dressing and silver alginate 2/15;  left posterior calf. Some improvement in the surface of the wound but still very gritty we have been using Iodoflex under compression. 04/05/2021: Left lateral posterior calf. She continues to have significant amounts of drainage, some of which is probably comprised of the Iodoflex microbleeds. There is no odor to the drainage. The satellite lesion on the more posterior aspect of the calf is epithelializing nicely and has contracted quite a bit. There is robust granulation tissue in the dominant wound with minimal adherent slough. [name], [name] ([id number]) [id number] Page 8 of 12 04/12/2021: The satellite lesion has nearly closed. She continues to have good  granulation tissue with minimal slough of the larger primary wound. She continues to have a fair amount of drainage, but I think this is less secondary to switching her dressing from Iodoflex to Prisma. 04/19/2021: The satellite lesion is almost completely epithelialized. The larger primary wound continues to contract with good granulation tissue and minimal slough. She is currently in [place] with compression. 04/26/2021: The satellite lesion has closed. The larger primary wound has contracted further and the granulation tissue is robust without being hypertrophic. Minimal slough is present. 05/03/2021: The satellite lesion remains closed. The larger primary wound has a bit of slough, but has also contracted further with good perimeter epithelialization. Good granulation tissue at the wound surface. There was some greenish drainage on the dressing when it was removed; it is not the blue-green typically associated with Pseudomonas aeruginosa, however. 05/10/2021: The primary wound continues to contract. There is minimal slough. The granulation tissue at 9:00 is a bit hypertrophic. No significant drainage. No odor. 05/17/2021: The wound is a little bit smaller today with good granulation tissue. Minimal slough. No significant drainage or odor. 05/24/2021: The wound continues to contract and has a nice base of granulation tissue. Small amount of slough. No concern for infection. 05/31/2021: For some reason, the wound measured slightly larger today but overall it still appears to be in good condition with a nice base of granulation tissue and minimal slough. 06/07/2021: The wound is smaller today. Good granulation tissue and minimal slough. 06/14/2021: The wound is unchanged in size. She continues to accumulate some slough. Good granulation tissue on the surface. 06/20/2021: The wound is smaller today. Minimal slough with good granulation tissue. 06/27/2021: The wound on her left lateral leg is smaller today with  just a bit of slough and eschar accumulation. Unfortunately, she has opened a new superficial wound on her right lower extremity. She has 3+ pitting edema to the knees on that leg and she does not wear compression stockings. 07/04/2021: In addition to the superficial wound on her right lower extremity that she opened up last week, she has opened 3 additional sites on the same leg. Her compression wraps were clearly not in correct position when she came to clinic today as they were at about the mid calf level rather than to the tibial tuberosity. She says that they slipped earlier and she had to come back on Friday to have them redone. The wound on her left lateral leg is perhaps slightly larger with some slough accumulation. 07/11/2021: The superficial wounds on her right lower extremity are nearly closed. They just have a thin layer of eschar overlying them. The wound on her left lateral leg is a little bit shallower and has a bit of slough accumulation. 07/17/2021: The right medial lower extremity leg wounds have almost completely closed; one of them just has a tiny opening with a little bit of serous  drainage. The left lateral leg wound is really unchanged. It seems to be stalled. 07/24/2021: The right leg wounds are completely closed. The left lateral leg wound is actually bigger today. It is a bit more tender. There is a minor accumulation of slough on the surface. 07/31/2021: The culture that I took last week was positive for MRSA. We added mupirocin under the silver alginate and I prescribed doxycycline. She has been tolerating this well. The wound is smaller today but does have slough accumulation. No significant pain or drainage. 08/07/2021: She completed her course of doxycycline. The wound looks much better today. It is smaller and the periwound is less inflamed. She does have some slough accumulation on the surface. 08/14/2021: The wound continues to contract. There is slough on the wound surface,  but the periwound is intact without inflammation or induration. 08/21/2021: The wound is down to just 2 small open sites. They both have a bit of slough accumulation. 08/28/2021: The wound is down to just 1 small open site with a little bit of slough and eschar accumulation. 09/04/2021: The wound continues to contract but remains open. It is clean without any slough. 09/12/2021: No real change in the overall wound dimensions, but it is flush with the surrounding skin. There is a little bit of slough accumulation. Edema control is good. 09/22/2021: The wound is smaller and even more superficial. Minimal slough accumulation. Good control of edema. 09/29/2021: The wound continues to contract. She reports that it was a little bit "twingey" during the week. It is a little bit dry on inspection today. Light accumulation of slough. Good edema control. 10/06/2021: The wound is about the same size today. There is a little slough on the surface. Edema control is good. 10/13/2021: The wound is about the same size but more superficial. A little bit of slough on the surface. 10/23/2021: The wound is slightly smaller and continues to fill in. Minimal slough on the wound surface. 10/30/2021: The wound continues to contract but has not yet closed. Light slough and eschar present. 11/06/2021: The wound persists, but seems a little bit more epithelialized. There is still slough and eschar accumulation. 11/14/2021: The wound continues to contract but persists. Light eschar around the wound. 11/22/2021: The wound is down to just a pinhole opening. There is eschar overlying the surface. Edema control is excellent. 11/29/2021: Her wound is closed. READMISSION 01/22/2022 This is a patient to be discharged in October. She has severe chronic venous insufficiency at that time she had a wound on her left lower leg posteriorly also the right leg at some point. These closed. We discharged her in 30/40 mm stockings from elastic therapy  which she has been wearing religiously. She tells [name] that she was traumatized by a dolly while shopping at [place] about 2 to 3 weeks ago. She has been left with a left anterior lower leg wound. She comes in in another pair of stockings other than her 3040s. She does not have an arterial issue with her last ABI in the left at 1.2 [name], [name] ([id number]) [id number] Page 9 of 12 12/18; this is a patient who has chronic venous insufficiency and recurrent venous insufficiency ulcers. She has a area on her left anterior lower leg and we readmitted her to the clinic last week. We use silver alginate and 4-layer compression. ABI was 1.2 She brought in her stocking which was a 20/30 below-knee stocking from elastic therapy. She may require a 30/40 mm equivalent stockings after this wound  heals. 02/06/2022: The intake nurse reported an odor coming from the wound when she first unwrapped it but this abated after the leg was washed. There is thick layer of slough on the surface. 02/13/2022: The wound is cleaner today. It measured larger, but on visual inspection, I think some crust of Iodoflex was included in the wound measurements; the actual wound itself looks about the same to slightly smaller. Edema control is good. 02/20/2022: The wound is cleaner again today. It is also measuring a little bit smaller, but there has been some moisture related periwound breakdown. Edema control is good. 02/27/2022: The intake nurse reported that the wound measures larger, but some of this ended up being crusted on Iodoflex. There is still some periwound moisture. Still with thick slough accumulation. Edema control is good. 03/06/2022: The wound is cleaner and more superficial, but did measure larger because of the inclusion of a satellite area. Still with some slough accumulation but less so than on prior visits. Patient History Information obtained from Patient. Family History Heart Disease -  Mother,Father, Hypertension - Mother,Father, Kidney Disease - Mother, Thyroid Problems - Mother, No family history of Cancer, Diabetes, Hereditary Spherocytosis, Lung Disease, Seizures, Stroke, Tuberculosis. Social History Never smoker, Marital Status - Single, Alcohol Use - Never, Drug Use - No History, Caffeine Use - Daily - coffee. Medical History Eyes Patient has history of Cataracts Hematologic/Lymphatic Patient has history of Lymphedema Cardiovascular Patient has history of Hypertension, Peripheral Venous Disease Integumentary (Skin) Denies history of History of Burn Musculoskeletal Patient has history of Osteoarthritis Hospitalization/Surgery History - colostomy reversal. 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 Medical A Surgical History Notes nd Constitutional Symptoms (General Health) morbid obesity Cardiovascular schamberg disease, hyperlipidemia Gastrointestinal diverticulitis , h/o obstruction due to diverticulitis , colostomy and colostomy reversal Endocrine hypothyroidism Objective Constitutional no acute distress. Vitals Time Taken: 9:40 AM, Height: 62 in, Weight: 222 lbs, BMI: 40.6, Temperature: 98 F, Pulse: 85 bpm, Respiratory Rate: 16 breaths/min, Blood Pressure: 112/72 mmHg. Respiratory Normal work of breathing on room air. General Notes: 03/06/2022: The wound is cleaner and more superficial, but did measure larger because of the inclusion of a satellite area. Still with some slough accumulation but less so than on prior visits. Integumentary (Hair, Skin) Wound #7 status is Open. Original cause of wound was Blister. The date acquired was: 01/22/2022. The wound has been in treatment 6 weeks. The wound is located on the Left,Anterior Lower Leg. The wound measures 4.3cm length x 3.4cm width x 0.2cm depth; 11.483cm^2 area and 2.297cm^3 volume. There is Fat Layer (Subcutaneous Tissue) exposed. There is no tunneling or undermining noted. There is a medium amount of  serosanguineous drainage noted. The wound margin is distinct with the outline attached to the wound base. There is small (1-33%) red granulation within the wound bed. There is a large (67-100%) amount of necrotic tissue within the wound bed including Adherent Slough. The periwound skin appearance had no abnormalities noted for texture. The periwound skin appearance exhibited: Maceration, Hemosiderin Staining, Erythema. The periwound skin appearance did not exhibit: Dry/Scaly. The surrounding wound skin color is noted with erythema which is circumferential. Periwound temperature was noted as No Abnormality. 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 There was a  Excisional Skin/Subcutaneous Tissue Debridement with a total area of 14.62 sq cm performed by Erin Maudlin, MD. With the following instrument(s): Curette to remove Non-Viable tissue/material. Material removed includes Subcutaneous Tissue and Slough and after achieving pain control using Lidocaine 5% topical ointment. No specimens were taken. A time out was conducted at 10:05, prior to the start of the procedure. A Minimum amount of bleeding was controlled with Pressure. The procedure was tolerated well with a pain level of 0 throughout and a pain level of 0 following the procedure. Post Debridement Measurements: 4.3cm length x 3.4cm width x 0.2cm depth; 2.297cm^3 volume. Character of Wound/Ulcer Post Debridement is improved. Post procedure Diagnosis Wound #7: Same as Pre-Procedure General Notes: Scribed for Dr. Celine [name] by [name] Pre-procedure diagnosis of Wound #7 is a Lymphedema  located on the Left,Anterior Lower Leg . There was a Four Layer Compression Therapy Procedure by Erin Catholic, RN. Post procedure Diagnosis Wound #7: Same as Pre-Procedure Plan Follow-up Appointments: Return Appointment in 1 week. - Dr. Celine [name] Room 3 Anesthetic: (In clinic) Topical Lidocaine 5% applied to wound bed Bathing/ Shower/ Hygiene: May shower with protection but do not get wound dressing(s) wet. Protect dressing(s) with water repellant cover (for example, large plastic bag) or a cast cover and may then take shower. Edema Control - Lymphedema / SCD / Other: Avoid standing for long periods of time. Patient to wear own compression stockings every day. Exercise regularly WOUND #7: - Lower Leg Wound Laterality: Left, Anterior Cleanser: Soap and Water 1 x Per Week/30 Days Discharge Instructions: May shower and wash wound with dial antibacterial soap and water prior to dressing change. Cleanser: Wound Cleanser 1 x Per Week/30 Days Discharge Instructions: Cleanse the wound with wound cleanser prior to applying a clean dressing using gauze sponges, not tissue or cotton balls. Peri-Wound Care: Zinc Oxide Ointment 30g tube 1 x Per Week/30 Days Discharge Instructions: Apply Zinc Oxide to periwound with each dressing change Prim Dressing: IODOFLEX 0.9% Cadexomer Iodine Pad 4x6 cm 1 x Per Week/30 Days ary Discharge Instructions: Apply to wound bed as instructed Secondary Dressing: Woven Gauze Sponge, Non-Sterile 4x4 in 1 x Per Week/30 Days Discharge Instructions: Apply over primary dressing as directed. Secondary Dressing: Zetuvit Plus 4x8 in 1 x Per Week/30 Days Discharge Instructions: Apply over primary dressing as directed. Secured With: Transpore Surgical T ape, 2x10 (in/yd) 1 x Per Week/30 Days Discharge Instructions: Secure dressing with tape as directed. Com pression Wrap: FourPress (4 layer compression wrap) 1 x Per Week/30 Days Discharge Instructions: Apply four layer  compression as directed. May also use [place] CoFlex 2 layer compression system as alternative. 03/06/2022: The wound is cleaner and more superficial, but did measure larger because of the inclusion of a satellite area. Still with some slough accumulation but less so than on prior visits. I used a curette to debride slough and nonviable subcutaneous tissue from the wound. I think it is clean enough now that we can discontinue Iodoflex. I am going to change to silver alginate. Continue 4-layer compression. Follow-up in 1 week. Electronic Signature(s) Signed: 03/06/2022 10:17:46 AM By: Erin Maudlin MD FACS Entered By: [name] on 03/06/2022 10:17:46 [name], [name] ([id number]) [id number] Page 11 of 12 -------------------------------------------------------------------------------- HxROS Details Patient Name: Date of Service: [name], [name] 03/06/2022 9:30 A M Medical Record Number: [id number] Patient Account Number: [id number] Date of Birth/Sex: Treating RN: 09-May-1955 (67 y.o. F) Primary Care Provider: Glendale [name] Other Clinician: Referring Provider: Treating Provider/Extender: [name] in  Treatment: 6 Information Obtained From Patient Constitutional Symptoms (General Health) Medical History: Past Medical History Notes: morbid obesity Eyes Medical History: Positive for: Cataracts Hematologic/Lymphatic Medical History: Positive for: Lymphedema Cardiovascular Medical History: Positive for: Hypertension; Peripheral Venous Disease Past Medical History Notes: schamberg disease, hyperlipidemia Gastrointestinal Medical History: Past Medical History Notes: diverticulitis , h/o obstruction due to diverticulitis , colostomy and colostomy reversal Endocrine Medical History: Past Medical History Notes: hypothyroidism Integumentary (Skin) Medical History: Negative for: History of  Burn Musculoskeletal Medical History: Positive for: Osteoarthritis HBO Extended History Items Eyes: Cataracts Immunizations Pneumococcal Vaccine: Received Pneumococcal Vaccination: Yes Received Pneumococcal Vaccination On or After 60th Birthday: Yes Implantable Devices None Hospitalization / Surgery History THAILA, BOTTOMS ([id number]) [id number] Page 12 of 12 Type of Hospitalization/Surgery colostomy reversal 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 Family and Social History Cancer: No; Diabetes: No; Heart Disease: Yes - Mother,Father; Hereditary Spherocytosis: No; Hypertension: Yes - Mother,Father; Kidney Disease: Yes - Mother; Lung Disease: No; Seizures: No; Stroke: No; Thyroid Problems: Yes - Mother; Tuberculosis: No; Never smoker; Marital Status - Single; Alcohol Use: Never; Drug Use: No History; Caffeine Use: Daily - coffee; Financial Concerns: No; Food, Clothing or Shelter Needs: No; Support System Lacking: No; Transportation Concerns: No Electronic Signature(s) Signed: 03/06/2022 12:41:20 PM By: Erin Maudlin MD FACS Entered By: [name] on 03/06/2022 10:16:27 -------------------------------------------------------------------------------- SuperBill Details Patient Name: Date of Service: [name] 03/06/2022 Medical Record Number: [id number] Patient Account Number: [id number] Date of Birth/Sex: Treating RN: [birth year] (67 y.o. 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 with other spec Modifier: ified severity Quantity: 1 Physician Procedures : CPT4 Code Description Modifier [id number] 99213 - WC PHYS LEVEL 3 - EST PT 25 ICD-10 Diagnosis Description L97.928 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

## 2022-03-06 NOTE — Progress Notes (Signed)
[name] ([id number]) [id number] Page 1 of 7 Visit Report for 03/06/2022 Arrival Information Details Patient Name: Date of Service: [name], [name] 03/06/2022 9:30 A M Medical Record Number: [id number] Patient Account Number: [id number] Date of Birth/Sex: Treating RN: [birth year] (67 y.o. [name] Primary Care Jaymen Fetch: [name] Other Clinician: Referring Naresh Althaus: Treating Shylo Dillenbeck/Extender: [name] in Treatment: 6 Visit Information History Since Last Visit Added or deleted any medications: No Patient Arrived: [name] Any new allergies or adverse reactions: No Arrival Time: 09:40 Had a fall or experienced change in No Accompanied By: self activities of daily living that may affect Transfer Assistance: None risk of falls: Patient Identification Verified: Yes Signs or symptoms of abuse/neglect since last visito No Hospitalized since last visit: No Implantable device outside of the clinic excluding No cellular tissue based products placed in the center since last visit: Has Dressing in Place as Prescribed: Yes Has Compression in Place as Prescribed: Yes Pain Present Now: No Electronic Signature(s) Signed: 03/06/2022 3:28:26 PM By: [name] RN Entered By: [name] on 03/06/2022 09:57:57 -------------------------------------------------------------------------------- Compression Therapy Details Patient Name: Date of Service: [name] 03/06/2022 9:30 A M Medical Record Number: [id number] Patient Account Number: [id number] Date of Birth/Sex: Treating RN: Feb 21, 1955 (67 y.o. [name] Primary Care Josephyne Tarter: [name] Other Clinician: Referring Brantley Naser: Treating Jaydien Panepinto/Extender: [name] in Treatment: 6 Compression Therapy Performed for Wound Assessment: Wound #7 Left,Anterior Lower Leg Performed By: Clinician [name],  RN Compression Type: Four Layer Post Procedure Diagnosis Same as Pre-procedure Electronic Signature(s) Signed: 03/06/2022 3:28:26 PM By: [name] RN Entered By: [name] on 03/06/2022 10:16:20 [name] ([id number]) [phone].pdf Page 2 of 7 -------------------------------------------------------------------------------- Encounter Discharge Information Details Patient Name: Date of Service: [name] 03/06/2022 9:30 A M Medical Record Number: [id number] Patient Account Number: [id number] Date of Birth/Sex: Treating RN: 05-16-1955 (67 y.o. [name] Primary Care Eureka Valdes: [name] Other Clinician: Referring Quinci Gavidia: Treating Richmond Coldren/Extender: [name] in Treatment: 6 Encounter Discharge Information Items Post Procedure Vitals Discharge Condition: Stable Temperature (F): 98.5 Ambulatory Status: Cane Pulse (bpm): 85 Discharge Destination: Home Respiratory Rate (breaths/min): 16 Transportation: Private Auto Blood Pressure (mmHg): 112/72 Accompanied By: self Schedule Follow-up Appointment: Yes Clinical Summary of Care: Patient Declined Electronic Signature(s) Signed: 03/06/2022 3:28:26 PM By: [name] RN Entered By: [name] on 03/06/2022 10:38:37 -------------------------------------------------------------------------------- Lower Extremity Assessment Details Patient Name: Date of Service: [name], [name] 03/06/2022 9:30 A M Medical Record Number: [id number] Patient Account Number: [id number] Date of Birth/Sex: Treating RN: 1955/06/03 (67 y.o. [name] Primary Care Farin Buhman: [name] Other Clinician: Referring Gregorey Nabor: Treating Chancey Ringel/Extender: [name] in Treatment: 6 Edema Assessment Assessed: [Left: No] [Right: No] [Left: Edema] [Right: :] Calf Left: Right: Point of Measurement: From Medial Instep 38.5  cm Ankle Left: Right: Point of Measurement: From Medial Instep 20.5 cm Vascular Assessment Pulses: Dorsalis Pedis Palpable: [Left:Yes] Electronic Signature(s) Signed: 03/06/2022 3:28:26 PM By: [name] RN Entered By: [name] on 03/06/2022 09:59:02 Multi Wound Chart Details -------------------------------------------------------------------------------- [name] ([id number]) [id number] Page 3 of 7 Patient Name: Date of Service: [name], [name] 03/06/2022 9:30 A M Medical Record Number: [id number] Patient Account Number: [id number] Date of Birth/Sex: Treating RN: [birth year] (67 y.o. F) Primary Care Montine Hight: [name] Other Clinician: Referring Demarques Pilz: Treating Isaid Salvia/Extender: [name] in Treatment: 6 Vital Signs Height(in): 62 Pulse(bpm): 85 Weight(lbs):  222 Blood Pressure(mmHg): 112/72 Body Mass Index(BMI): 40.6 Temperature(F): 98 Respiratory Rate(breaths/min): 16 Wound Assessments Wound Number: 7 N/A N/A Photos: N/A N/A Left, Anterior Lower Leg N/A N/A Wound Location: Blister N/A N/A Wounding Event: Lymphedema N/A N/A Primary Etiology: Cataracts, Lymphedema, N/A N/A Comorbid History: Hypertension, Peripheral Venous Disease, Osteoarthritis 01/22/2022 N/A N/A Date Acquired: 6 N/A N/A Weeks of Treatment: Open N/A N/A Wound Status: No N/A N/A Wound Recurrence: 4.3x3.4x0.2 N/A N/A Measurements L x W x [name] 11.483 N/A N/A A (cm) : rea 2.297 N/A N/A Volume (cm) : -269.20% N/A N/A % Reduction in Area: -269.30% N/A N/A % Reduction in Volume: Full Thickness Without Exposed N/A N/A Classification: Support Structures Medium N/A N/A Exudate Amount: Serosanguineous N/A N/A Exudate Type: red, brown N/A N/A Exudate Color: Distinct, outline attached N/A N/A Wound Margin: Small (1-33%) N/A N/A Granulation Amount: Red N/A N/A Granulation Quality: Large (67-100%)  N/A N/A Necrotic Amount: Fat Layer (Subcutaneous Tissue): Yes N/A N/A Exposed Structures: Fascia: No Tendon: No Muscle: No Joint: No Bone: No Small (1-33%) N/A N/A Epithelialization: No Abnormalities Noted N/A N/A Periwound Skin Texture: Maceration: Yes N/A N/A Periwound Skin Moisture: Dry/Scaly: No Erythema: Yes N/A N/A Periwound Skin Color: Hemosiderin Staining: Yes Circumferential N/A N/A Erythema Location: No Abnormality N/A N/A Temperature: Treatment Notes Electronic Signature(s) Signed: 03/06/2022 10:15:32 AM By: [name] MD FACS Entered By: [name] on 03/06/2022 10:15:32 [name] ([id number]) [id number] Page 4 of 7 -------------------------------------------------------------------------------- Multi-Disciplinary Care Plan Details Patient Name: Date of Service: [name], [name] 03/06/2022 9:30 A M Medical Record Number: [id number] Patient Account Number: [id number] Date of Birth/Sex: Treating RN: 1955/10/28 (67 y.o. [name] Primary Care Charnise Lovan: [name] Other Clinician: Referring Elliott Quade: Treating Robi Dewolfe/Extender: [name] in Treatment: 6 Active Inactive Abuse / Safety / Falls / Self Care Management Nursing Diagnoses: History of Falls Impaired physical mobility Goals: Patient/caregiver will identify factors that restrict self-care and home management Date Initiated: 01/22/2022 Target Resolution Date: 06/12/2022 Goal Status: Active Interventions: Assess fall risk on admission and as needed Assess: immobility, friction, shearing, incontinence upon admission and as needed Notes: Wound/Skin Impairment Nursing Diagnoses: Impaired tissue integrity Knowledge deficit related to ulceration/compromised skin integrity Goals: Patient/caregiver will verbalize understanding of skin care regimen Date Initiated: 01/22/2022 Target Resolution Date: 06/12/2022 Goal  Status: Active Interventions: Assess ulceration(s) every visit Treatment Activities: Skin care regimen initiated : 01/22/2022 Topical wound management initiated : 01/22/2022 Notes: Electronic Signature(s) Signed: 03/06/2022 3:28:26 PM By: [name] RN Entered By: [name] on 03/06/2022 10:12:49 -------------------------------------------------------------------------------- Pain Assessment Details Patient Name: Date of Service: [name] 03/06/2022 9:30 A M Medical Record Number: [id number] Patient Account Number: [id number] Date of Birth/Sex: Treating RN: September 16, 1955 (67 y.o. [name] Primary Care Cleveland Paiz: [name] Other Clinician: Referring Kasondra Junod: Treating Natara Monfort/Extender: [name] in Treatment: 6 Active Problems Location of Pain Severity and Description of Pain Patient Has Paino No Site Locations [place], [place] [name] ([id number]) [phone].pdf Page 5 of 7 Pain Management and Medication Current Pain Management: Electronic Signature(s) Signed: 03/06/2022 3:28:26 PM By: [name] RN Entered By: [name] on 03/06/2022 09:58:40 -------------------------------------------------------------------------------- Patient/Caregiver Education Details Patient Name: Date of Service: [name] 1/23/2024andnbsp9:30 [place] Record Number: [id number] Patient Account Number: [id number] Date of Birth/Gender: Treating RN: 1955-05-16 (67 y.o. [name] Primary Care Physician: [name] Other Clinician: Referring Physician: Treating Physician/Extender: [name] in Treatment: 6 Education Assessment Education Provided To:  Patient Education Topics Provided Wound/Skin Impairment: Methods: Explain/Verbal Responses: Return demonstration correctly Electronic Signature(s) Signed: 03/06/2022 3:28:26 PM By: [name] RN Entered By:  [name] on 03/06/2022 10:13:40 -------------------------------------------------------------------------------- Wound Assessment Details Patient Name: Date of Service: [name], [name] 03/06/2022 9:30 A M Medical Record Number: [id number] Patient Account Number: [id number] Date of Birth/Sex: Treating RN: 03-01-1955 (67 y.o. [name] Primary Care Kayan Blissett: [name] Other Clinician: Referring Audrena Talaga: Treating Angelyn Osterberg/Extender: [name], [name] ([id number]) [id number] Page 6 of 7 Weeks in Treatment: 6 Wound Status Wound Number: 7 Primary Lymphedema Etiology: Wound Location: Left, Anterior Lower Leg Wound Open Wounding Event: Blister Status: Date Acquired: 01/22/2022 Comorbid Cataracts, Lymphedema, Hypertension, Peripheral Venous Weeks Of Treatment: 6 History: Disease, Osteoarthritis Clustered Wound: No Photos Wound Measurements Length: (cm) 4.3 Width: (cm) 3.4 Depth: (cm) 0.2 Area: (cm) 11.483 Volume: (cm) 2.297 % Reduction in Area: -269.2% % Reduction in Volume: -269.3% Epithelialization: Small (1-33%) Tunneling: No Undermining: No Wound Description Classification: Full Thickness Without Exposed Suppor Wound Margin: Distinct, outline attached Exudate Amount: Medium Exudate Type: Serosanguineous Exudate Color: red, brown t Structures Foul Odor After Cleansing: No Slough/Fibrino Yes Wound Bed Granulation Amount: Small (1-33%) Exposed Structure Granulation Quality: Red Fascia Exposed: No Necrotic Amount: Large (67-100%) Fat Layer (Subcutaneous Tissue) Exposed: Yes Necrotic Quality: Adherent Slough Tendon Exposed: No Muscle Exposed: No Joint Exposed: No Bone Exposed: No Periwound Skin Texture Texture Color No Abnormalities Noted: Yes No Abnormalities Noted: No Erythema: Yes Moisture Erythema Location: Circumferential No Abnormalities Noted: No Hemosiderin Staining:  Yes Dry / Scaly: No Maceration: Yes Temperature / Pain Temperature: No Abnormality Electronic Signature(s) Signed: 03/06/2022 3:28:26 PM By: [name] RN Entered By: [name] on 03/06/2022 10:02:35 -------------------------------------------------------------------------------- Vitals Details Patient Name: Date of Service: [name] 03/06/2022 9:30 A M Medical Record Number: [id number] Patient Account Number: [id number] [name], [name] ([id number]) [phone].pdf Page 7 of 7 Date of Birth/Sex: Treating RN: Jun 20, 1955 (67 y.o. [name] Primary Care Keigan Girten: Other Clinician: [name] Referring Quantavious Eggert: Treating Hillery Zachman/Extender: [name] in Treatment: 6 Vital Signs Time Taken: 09:40 Temperature (F): 98 Height (in): 62 Pulse (bpm): 85 Weight (lbs): 222 Respiratory Rate (breaths/min): 16 Body Mass Index (BMI): 40.6 Blood Pressure (mmHg): 112/72 Reference Range: 80 - 120 mg / dl Electronic Signature(s) Signed: 03/06/2022 3:28:26 PM By: [name] RN Entered By: [name] on 03/06/2022 09:58:31

## 2022-03-07 ENCOUNTER — Ambulatory Visit: Payer: Self-pay

## 2022-03-07 ENCOUNTER — Ambulatory Visit (INDEPENDENT_AMBULATORY_CARE_PROVIDER_SITE_OTHER): Payer: Commercial Managed Care - PPO

## 2022-03-07 ENCOUNTER — Encounter: Payer: Self-pay | Admitting: Orthopaedic Surgery

## 2022-03-07 ENCOUNTER — Ambulatory Visit (INDEPENDENT_AMBULATORY_CARE_PROVIDER_SITE_OTHER): Payer: Commercial Managed Care - PPO | Admitting: Sports Medicine

## 2022-03-07 ENCOUNTER — Ambulatory Visit: Payer: Commercial Managed Care - PPO | Admitting: Orthopaedic Surgery

## 2022-03-07 VITALS — BP 138/77 | HR 81 | Ht 62.0 in | Wt 226.0 lb

## 2022-03-07 DIAGNOSIS — M1611 Unilateral primary osteoarthritis, right hip: Secondary | ICD-10-CM | POA: Diagnosis not present

## 2022-03-07 DIAGNOSIS — M25551 Pain in right hip: Secondary | ICD-10-CM

## 2022-03-07 MED ORDER — LIDOCAINE HCL 1 % IJ SOLN
4.0000 mL | INTRAMUSCULAR | Status: AC | PRN
  Administered 2022-03-07: 4 mL

## 2022-03-07 MED ORDER — METHYLPREDNISOLONE ACETATE 40 MG/ML IJ SUSP
80.0000 mg | INTRAMUSCULAR | Status: AC | PRN
  Administered 2022-03-07: 80 mg via INTRA_ARTICULAR

## 2022-03-07 NOTE — Progress Notes (Signed)
Office Visit Note   Patient: [name]           Date of Birth: March 22, 1955           MRN: [id number] Visit Date: 03/07/2022              Requested by: Minette Brine, [address] [place],  [place] [address] PCP: [name], MD   Assessment & Plan: Visit Diagnoses:  1. Pain in right hip   2. Unilateral primary osteoarthritis, right hip     Plan: Will refer her upstairs for ultrasound-guided hip injection.  She can follow-up with me in a month or so and she will continue her Wegovy and work on weight loss.  She needs to get her BMI below 40 so she would be a candidate for a hip arthroplasty from a direct anterior approach.  Follow-Up Instructions: No follow-ups on file.   Orders:  Orders Placed This Encounter  Procedures   XR HIP UNILAT W OR W/O PELVIS 2-3 VIEWS RIGHT   XR Lumbar Spine 2-3 Views   No orders of the defined types were placed in this encounter.     Procedures: No procedures performed   Clinical Data: No additional findings.   Subjective: Chief Complaint  Patient presents with   Right Hip - Pain   Right Knee - Pain    HPI 67 year old female seen with difficulty ambulating right knee pain right groin pain on meloxicam with some improvement.  She has to ambulate with a cane has trouble getting upright problems crossing her legs or tying her shoes.  Pain radiates down from her hip into her groin down to her knee.  Review of Systems negative for gout or pseudogout.  Positive scoliosis.  Positive for morbid obesity.   Objective: Vital Signs: BP 138/77   Pulse 81   Ht '5\' 2"'$  (1.575 m)   Wt 226 lb (102.5 kg)   BMI 41.34 kg/m   Physical Exam Constitutional:      Appearance: She is well-developed.  HENT:     Head: Normocephalic.     Right Ear: External ear normal.     Left Ear: External ear normal. There is no impacted cerumen.  Eyes:     Pupils: Pupils are equal, round, and reactive to light.  Neck:     Thyroid: No  thyromegaly.     Trachea: No tracheal deviation.  Cardiovascular:     Rate and Rhythm: Normal rate.  Pulmonary:     Effort: Pulmonary effort is normal.  Abdominal:     Palpations: Abdomen is soft.  Musculoskeletal:     Cervical back: No rigidity.  Skin:    General: Skin is warm and dry.  Neurological:     Mental Status: She is alert and oriented to person, place, and time.  Psychiatric:        Behavior: Behavior normal.     Ortho Exam severe pain reproduced with internal rotation of the right hip.  Some sciatic notch tenderness pain shoots from her right groin down to just above her right knee with internal rotation.  She is unable figure-of-four on the right she can do it on the left side.  Specialty Comments:  No specialty comments available.  Imaging: No results found.   PMFS History: Patient Active Problem List   Diagnosis Date Noted   Unilateral primary osteoarthritis, right hip 03/13/2022   Peripheral vascular disease ([place]) 10/27/2018   Morbid obesity ([place])  Schamberg disease    Hyperlipidemia    Arthritis    DOE (dyspnea on exertion)    History of colostomy reversal 02/06/2017   Diverticular disease 02/06/2017   Diverticulitis with obstruction s/p sigmoid colectomy May 2018 & colostomy takedown 02/06/2017 07/10/2016   Lower extremities edema 07/06/2016   Hypothyroidism 07/05/2016   Hypertension 07/05/2016   Pre-diabetes 07/05/2016   Past Medical History:  Diagnosis Date   Arthritis    DOE (dyspnea on exertion)    mild   Hyperlipidemia    Hypertension    Hypothyroidism    Morbid obesity ([facility])    Pre-diabetes    Schamberg disease     Family History  Problem Relation Age of Onset   Hypertension Mother    Heart Problems Mother        Pacemaker   Heart attack Father        at age 49.   Heart attack Maternal Grandfather 58       Died of AAA   AAA (abdominal aortic aneurysm) Maternal Grandfather    Heart attack Maternal Uncle    Stroke Paternal  Aunt    Alzheimer's disease Maternal Grandmother     Past Surgical History:  Procedure Laterality Date   BOWEL RESECTION  07/10/2016   Procedure: SMALL BOWEL RESECTION repair of small bowel x 2;  Surgeon: Fanny Skates, MD;  Location: [facility];  Service: General;;   COLON RESECTION SIGMOID  07/10/2016   Procedure: sigmoid colon resection with colostomy ;  Surgeon: Fanny Skates, MD;  Location: [facility];  Service: General;;   COLOSTOMY TAKEDOWN N/A 02/06/2017   Procedure: LAPAROSCOPIC CONVERTED OPEN COLOSTOMY REVISION WITH SMALL BOWEL RESECTION;  Surgeon: Leighton Ruff, MD;  Location: [facility];  Service: General;  Laterality: N/A;   FLEXIBLE SIGMOIDOSCOPY N/A 07/06/2016   Procedure: Beryle Quant;  Surgeon: Carol Ada, MD;  Location: [facility];  Service: Endoscopy;  Laterality: N/A;   LAPAROSCOPY N/A 07/10/2016   Procedure: LAPAROSCOPY converted to open;  Surgeon: Fanny Skates, MD;  Location: [facility];  Service: General;  Laterality: N/A;   TONSILLECTOMY     age 44 yrs   Social History   Occupational History   Not on file  Tobacco Use   Smoking status: Never   Smokeless tobacco: Never  Vaping Use   Vaping Use: Never used  Substance and Sexual Activity   Alcohol use: No   Drug use: No   Sexual activity: Not on file

## 2022-03-07 NOTE — Progress Notes (Signed)
   Procedure Note  Patient: [name]             Date of Birth: 1955/03/11           MRN: [id number]             Visit Date: 03/07/2022  Procedures: Visit Diagnoses:  1. Pain in right hip   2. Unilateral primary osteoarthritis, right hip    Large Joint Inj: R hip joint on 03/07/2022 11:28 AM Indications: pain Details: 22 G 3.5 in needle, ultrasound-guided anterior approach Medications: 4 mL lidocaine 1 %; 80 mg methylPREDNISolone acetate 40 MG/ML Outcome: tolerated well, no immediate complications  Procedure: US-guided intra-articular hip injection, right After discussion on risks/benefits/indications and informed verbal consent was obtained, a timeout was performed. Patient was lying supine on exam table. The hip was cleaned with betadine and alcohol swabs. Then utilizing ultrasound guidance, the patient's femoral head and neck junction was identified and subsequently injected with 4:2 lidocaine:depomedrol via an in-plane approach with ultrasound visualization of the injectate administered into the hip joint. Patient tolerated procedure well without immediate complications.  Procedure, treatment alternatives, risks and benefits explained, specific risks discussed. Consent was given by the patient. Immediately prior to procedure a time out was called to verify the correct patient, procedure, equipment, support staff and site/side marked as required. Patient was prepped and draped in the usual sterile fashion.     - I evaluated the patient about 10 minutes post-injection and she had some improvement in pain and range of motion - follow-up with Dr. Lorin Mercy as indicated; I am happy to see them as needed  Elba Barman, DO [name]  This note was dictated using Dragon naturally speaking software and may contain errors in syntax, spelling, or content which have not been identified prior to signing this note.

## 2022-03-13 ENCOUNTER — Encounter (HOSPITAL_BASED_OUTPATIENT_CLINIC_OR_DEPARTMENT_OTHER): Payer: Commercial Managed Care - PPO | Admitting: General Surgery

## 2022-03-13 DIAGNOSIS — M1611 Unilateral primary osteoarthritis, right hip: Secondary | ICD-10-CM | POA: Insufficient documentation

## 2022-03-13 DIAGNOSIS — I872 Venous insufficiency (chronic) (peripheral): Secondary | ICD-10-CM | POA: Diagnosis not present

## 2022-03-13 DIAGNOSIS — L97822 Non-pressure chronic ulcer of other part of left lower leg with fat layer exposed: Secondary | ICD-10-CM | POA: Diagnosis not present

## 2022-03-13 DIAGNOSIS — L97928 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: Secondary | ICD-10-CM | POA: Diagnosis not present

## 2022-03-13 DIAGNOSIS — I739 Peripheral vascular disease, unspecified: Secondary | ICD-10-CM | POA: Diagnosis not present

## 2022-03-13 DIAGNOSIS — E039 Hypothyroidism, unspecified: Secondary | ICD-10-CM | POA: Diagnosis not present

## 2022-03-13 DIAGNOSIS — I890 Lymphedema, not elsewhere classified: Secondary | ICD-10-CM | POA: Diagnosis not present

## 2022-03-13 NOTE — Progress Notes (Signed)
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 Account Number: [id number] Date of Birth/Sex: Treating RN: [birth year] (68 y.o. 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 03/13/2022 9:30 A M Medical Record Number: [id number] Patient Account Number: [id number] Date of Birth/Sex:  Treating RN: 05-Jul-1955 (67 y.o. F) Primary Care Provider: [name] Other Clinician: Referring Provider: Treating Provider/Extender: [name] in Treatment: 7 Subjective Chief Complaint Information obtained from Patient 04/19/2021: The patient is here for ongoing follow-up regarding 2 left lower extremity wounds. 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 She tells [name] that she fell on the gravel driveway in July. She developed injuries on her distal lower leg which have not healed. She saw her primary physician on 11/15/2017 who noted her left shin injuries. Gave her antibiotics. At that point the wounds were almost circumferential however most were less than 1.5 cm. Weeping edema fluid was noted. She was referred here for evaluation. The patient has a history of chronic lower extremity edema. 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 She has had prior history with leg weeping fluid. She does not wear compression stockings. She is not doing anything specific to these wound areas. 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 She also has lymphedema. Put her in 3 layer compression and silver alginate last week. She has 3 small wounds with depth just lateral to the tibia. More problematically  than this she has  numerous shallow areas some of which are almost canal like in shape with tightly adherent painful debris. It would be very difficult and time- consuming to go through this and attempt to individually debride all these areas.. I changed her to collagen today to see if that would help with any of the surface debris on some of these wounds. Otherwise we will not be able to put this in compression we have to have someone change the dressing. The patient is not eligible for home health 12/25/17 on evaluation today patient actually appears to be doing rather well in regard to the ulcer on her lower extremity. Fortunately there does not appear to be evidence of infection at this time. She has been tolerating the dressing changes without complication. This includes the compression wrap. The only issue she had was that the wrap was initially placed over her bunion region which actually calls her some discomfort and pain. Other than that things seem to be going rather well. 01/01/2018 Seen today for follow-up and management of left lower extremity wound and lymphedema. T oday she presents with a new wound towards to the left lateral LE. Recently treated with a 7 day course of amoxicillin; reason for antibiotic dose is unknown at this time. Tolerating current treatment of collagen with 4- layer wraps. She obtained a venous reflux study on 12/25/17. Studies show on the right abnormal reflux times of the popliteal vein, great saphenous vein at the saphenofemoral junction at the proximal thigh, great saphenous vein at the mid calf, and origin of the small saphenous vein.No superficial thrombosis. No deep vein thrombosis in the common femoral, femoral,and popliteal veins. Left abnormal reflex times as well of the common femoral vein, popliteal vein, and a great saphenous vein at the saphenofemoral junction, In great saphenous vein at the mid thigh w/o thrombosis. Has any issues or concerns during visit today.  Recommended follow-up to vascular specialist due to abnormalities from the venous reflux study. Denies fever, pain, chills, dizziness, nausea, or vomiting. 01/08/18 upon evaluation today the patient actually seems to be showing some signs of improvement in my opinion at this point in regard to the lower extremity ulcerated areas. She still has a lot of drainage but fortunately nothing that appears to be too significant currently. I have been very happy with the overall progress I see today compared to where things were during the last evaluation that I had with her. Nonetheless she has not had her appointment with the vein specialist as of yet in fact we were able to get this approved for her today and confirmed with them she will be seeing them on December 26. Nonetheless in general I do feel like the compression wraps is doing well for her. 01/17/18; quite a bit of improvement since last time I saw this patient she has a small open area remaining on the left lateral calf and even smaller area medially. She has lymphedema chronic stasis changes with distal skin fibrosis. She has an appointment with vascular surgery later this month 01/24/2018; the patient's medial leg has closed. Still a small open area on the left lateral leg. She states that the 4 layer compression we put on last week was too tight and she had to take it off over a few days ago. We have had resultant increase in her lymphedema in the dorsal foot and a proximal calf. Fortunately that does not seem to have resulted in any deterioration in her wounds The patient  is going to need compression stockings. We have given her measurements to phone elastic therapy in [address]. She has vascular surgery consult on December 26 02/07/18; she's had continuous contraction on the left lateral leg wound which is now very small. Currently using silver alginate under 3 layer compression She saw Dr. Trula Slade of vascular surgery on 02/06/18. It was noted  that she had a normal reflux times in the popliteal vein, great saphenous vein at the saphenofemoral junction, great saphenous vein at the proximal thigh great saphenous vein at the mid calf and origin of the small saphenous vein. It was noted that she had significant reflux in the left saphenous veins with diameter measurements in the 0.7-0.8 cm range. It was felt she would benefit from laser ablation to help minimize the risk of ulcer recurrence. It was recommended that she wear 20-30 thigh-high compression stockings and have follow-up in 4-6 weeks 02/17/2018; I thought this lady would be healed however her compression slipped down and she developed increasing swelling and the wound is actually larger. 1/13; we had deterioration last week after the patient's compression slipped down and she developed periwound swelling. I increased her compression before layers we have been using silver alginate we are a lot better again today. She has her compression stockings in waiting 1/23; the patient's wounds are totally healed today. She has her stockings. This was almost circumferential skin damage. She follows up with Dr. Trula Slade of vascular surgery next Monday. READMISSION 02/21/2021 This is a now 67 year old woman that we had in clinic here discharging in January 2020 with wounds on her left calf chronic venous insufficiency. She was discharged with 30/40 stockings. It does not sound like she has worn stockings in about a year largely from not being able to get them on herself. In November she developed new blisters on her legs an area laterally is opened into a fairly sizable wound. She has weeping posteriorly as well. She has been using Neosporin and Band-Aids. The patient did see Dr. Trula Slade in 2019 and 2020. He felt she might benefit from laser ablation in her left saphenous veins although because of the wound that healed I do not think he went through with it. She might benefit from seeing him  again. Her ABI on the left is 1. 1/17; patient's wound on the posterior left calf is closed she has the 2 large areas last week. We put her in 4-layer compression for edema control is a lot better. Our intake nurse noted greenish drainage and odor. We have been using silver alginate 1/25; PCR culture I did have the substantial wound area on the left lateral lower leg showed staff aureus and group A strep. Low titers of coag negative staph which are probably skin contaminants. Resistance detected to tetracycline methicillin and macrolides. I gave her a starter kit of Nuzyra 150 mg x 3 for 2 days then 300 mg for a further 7 days. Marked odor considerable increase in surrounding erythema. We are using Iodoflex last week however I have changed to silver alginate with underlying Bactroban 2/1; she is completing her [place] tomorrow. The degree of erythema around the wounds looks a lot better. Odor has improved. I gave her silver alginate and Bactroban last week and changing her back to Iodoflex to continue with ongoing debridement 2/8; the periwound looks a lot better. Surface of the wound also looks somewhat better although there is still ongoing debridement to be done we have been using Iodoflex under compression. Primary dressing and  silver alginate 2/15; left posterior calf. Some improvement in the surface of the wound but still very gritty we have been using Iodoflex under compression. 04/05/2021: Left lateral posterior calf. She continues to have significant amounts of drainage, some of which is probably comprised of the Iodoflex microbleeds. There is no odor to the drainage. The satellite lesion on the more posterior aspect of the calf is epithelializing nicely and has contracted quite a bit. There is robust granulation tissue in the dominant wound with minimal adherent slough. LOYDA, COSTIN ([id number]) [id number] Page 8 of 12 04/12/2021: The satellite lesion has  nearly closed. She continues to have good granulation tissue with minimal slough of the larger primary wound. She continues to have a fair amount of drainage, but I think this is less secondary to switching her dressing from Iodoflex to Prisma. 04/19/2021: The satellite lesion is almost completely epithelialized. The larger primary wound continues to contract with good granulation tissue and minimal slough. She is currently in [place] with compression. 04/26/2021: The satellite lesion has closed. The larger primary wound has contracted further and the granulation tissue is robust without being hypertrophic. Minimal slough is present. 05/03/2021: The satellite lesion remains closed. The larger primary wound has a bit of slough, but has also contracted further with good perimeter epithelialization. Good granulation tissue at the wound surface. There was some greenish drainage on the dressing when it was removed; it is not the blue-green typically associated with Pseudomonas aeruginosa, however. 05/10/2021: The primary wound continues to contract. There is minimal slough. The granulation tissue at 9:00 is a bit hypertrophic. No significant drainage. No odor. 05/17/2021: The wound is a little bit smaller today with good granulation tissue. Minimal slough. No significant drainage or odor. 05/24/2021: The wound continues to contract and has a nice base of granulation tissue. Small amount of slough. No concern for infection. 05/31/2021: For some reason, the wound measured slightly larger today but overall it still appears to be in good condition with a nice base of granulation tissue and minimal slough. 06/07/2021: The wound is smaller today. Good granulation tissue and minimal slough. 06/14/2021: The wound is unchanged in size. She continues to accumulate some slough. Good granulation tissue on the surface. 06/20/2021: The wound is smaller today. Minimal slough with good granulation tissue. 06/27/2021: The wound on her  left lateral leg is smaller today with just a bit of slough and eschar accumulation. Unfortunately, she has opened a new superficial wound on her right lower extremity. She has 3+ pitting edema to the knees on that leg and she does not wear compression stockings. 07/04/2021: In addition to the superficial wound on her right lower extremity that she opened up last week, she has opened 3 additional sites on the same leg. Her compression wraps were clearly not in correct position when she came to clinic today as they were at about the mid calf level rather than to the tibial tuberosity. She says that they slipped earlier and she had to come back on Friday to have them redone. The wound on her left lateral leg is perhaps slightly larger with some slough accumulation. 07/11/2021: The superficial wounds on her right lower extremity are nearly closed. They just have a thin layer of eschar overlying them. The wound on her left lateral leg is a little bit shallower and has a bit of slough accumulation. 07/17/2021: The right medial lower extremity leg wounds have almost completely closed; one of them just has a tiny opening with a little  bit of serous drainage. The left lateral leg wound is really unchanged. It seems to be stalled. 07/24/2021: The right leg wounds are completely closed. The left lateral leg wound is actually bigger today. It is a bit more tender. There is a minor accumulation of slough on the surface. 07/31/2021: The culture that I took last week was positive for MRSA. We added mupirocin under the silver alginate and I prescribed doxycycline. She has been tolerating this well. The wound is smaller today but does have slough accumulation. No significant pain or drainage. 08/07/2021: She completed her course of doxycycline. The wound looks much better today. It is smaller and the periwound is less inflamed. She does have some slough accumulation on the surface. 08/14/2021: The wound continues to  contract. There is slough on the wound surface, but the periwound is intact without inflammation or induration. 08/21/2021: The wound is down to just 2 small open sites. They both have a bit of slough accumulation. 08/28/2021: The wound is down to just 1 small open site with a little bit of slough and eschar accumulation. 09/04/2021: The wound continues to contract but remains open. It is clean without any slough. 09/12/2021: No real change in the overall wound dimensions, but it is flush with the surrounding skin. There is a little bit of slough accumulation. Edema control is good. 09/22/2021: The wound is smaller and even more superficial. Minimal slough accumulation. Good control of edema. 09/29/2021: The wound continues to contract. She reports that it was a little bit "twingey" during the week. It is a little bit dry on inspection today. Light accumulation of slough. Good edema control. 10/06/2021: The wound is about the same size today. There is a little slough on the surface. Edema control is good. 10/13/2021: The wound is about the same size but more superficial. A little bit of slough on the surface. 10/23/2021: The wound is slightly smaller and continues to fill in. Minimal slough on the wound surface. 10/30/2021: The wound continues to contract but has not yet closed. Light slough and eschar present. 11/06/2021: The wound persists, but seems a little bit more epithelialized. There is still slough and eschar accumulation. 11/14/2021: The wound continues to contract but persists. Light eschar around the wound. 11/22/2021: The wound is down to just a pinhole opening. There is eschar overlying the surface. Edema control is excellent. 11/29/2021: Her wound is closed. READMISSION 01/22/2022 This is a patient to be discharged in October. She has severe chronic venous insufficiency at that time she had a wound on her left lower leg posteriorly also the right leg at some point. These closed. We discharged  her in 30/40 mm stockings from elastic therapy which she has been wearing religiously. She tells [name] that she was traumatized by a dolly while shopping at [place] about 2 to 3 weeks ago. She has been left with a left anterior lower leg wound. She comes in in another pair of stockings other than her 3040s. She does not have an arterial issue with her last ABI in the left at 1.2 NASYA, VINCENT D ([id number]) [id number] Page 9 of 12 12/18; this is a patient who has chronic venous insufficiency and recurrent venous insufficiency ulcers. She has a area on her left anterior lower leg and we readmitted her to the clinic last week. We use silver alginate and 4-layer compression. ABI was 1.2 She brought in her stocking which was a 20/30 below-knee stocking from elastic therapy. She may require a 30/40 mm equivalent stockings  after this wound heals. 02/06/2022: The intake nurse reported an odor coming from the wound when she first unwrapped it but this abated after the leg was washed. There is thick layer of slough on the surface. 02/13/2022: The wound is cleaner today. It measured larger, but on visual inspection, I think some crust of Iodoflex was included in the wound measurements; the actual wound itself looks about the same to slightly smaller. Edema control is good. 02/20/2022: The wound is cleaner again today. It is also measuring a little bit smaller, but there has been some moisture related periwound breakdown. Edema control is good. 02/27/2022: The intake nurse reported that the wound measures larger, but some of this ended up being crusted on Iodoflex. There is still some periwound moisture. Still with thick slough accumulation. Edema control is good. 03/06/2022: The wound is cleaner and more superficial, but did measure larger because of the inclusion of a satellite area. Still with some slough accumulation but less so than on prior visits. 03/13/2022: The wound measured a  little bit smaller today. There is slough and eschar accumulation, as per usual. Patient History Information obtained from Patient. Family History Heart Disease - Mother,Father, Hypertension - Mother,Father, Kidney Disease - Mother, Thyroid Problems - Mother, No family history of Cancer, Diabetes, Hereditary Spherocytosis, Lung Disease, Seizures, Stroke, Tuberculosis. Social History Never smoker, Marital Status - Single, Alcohol Use - Never, Drug Use - No History, Caffeine Use - Daily - coffee. Medical History Eyes Patient has history of Cataracts Hematologic/Lymphatic Patient has history of Lymphedema Cardiovascular Patient has history of Hypertension, Peripheral Venous Disease Integumentary (Skin) Denies history of History of Burn Musculoskeletal Patient has history of Osteoarthritis Hospitalization/Surgery History - colostomy reversal. 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 Medical A Surgical History Notes nd Constitutional Symptoms (General Health) morbid obesity Cardiovascular schamberg disease, hyperlipidemia Gastrointestinal diverticulitis , h/o obstruction due to diverticulitis , colostomy and colostomy reversal Endocrine hypothyroidism Objective Constitutional no acute distress. Vitals Time Taken: 9:46 AM, Height: 62 in, Weight: 222 lbs, BMI: 40.6, Temperature: 97.8 F, Pulse: 84 bpm, Respiratory Rate: 16 breaths/min, Blood Pressure: 100/66 mmHg. Respiratory Normal work of breathing on room air. General Notes: 03/13/2022: The wound measured a little bit smaller today. There is slough and eschar accumulation, as per usual. Integumentary (Hair, Skin) Wound #7 status is Open. Original cause of wound was Blister. The date acquired was: 01/22/2022. The wound has been in treatment 7 weeks. The wound is located on the Left,Anterior Lower Leg. The wound measures 3.5cm length x 3.3cm width x 0.2cm depth; 9.071cm^2 area and 1.814cm^3 volume. There is Fat Layer (Subcutaneous  Tissue) exposed. There is no tunneling or undermining noted. There is a medium amount of serosanguineous drainage noted. The wound margin is distinct with the outline attached to the wound base. There is small (1-33%) red granulation within the wound bed. There is a large (67-100%) amount of necrotic tissue within the wound bed including Eschar and Adherent Slough. The periwound skin appearance had no abnormalities noted for texture. The periwound skin appearance exhibited: Maceration, Hemosiderin Staining, Erythema. The periwound skin appearance did not exhibit: Dry/Scaly. The surrounding AARIYANA, MANZ ([id number]) [id number] Page 10 of 12 wound skin color is noted with erythema which is circumferential. Periwound temperature was noted as No Abnormality. 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 There was a Excisional Skin/Subcutaneous Tissue Debridement with a total area of 11.55 sq cm performed by [name], MD. With the following instrument(s): Curette to remove Non-Viable tissue/material. Material removed includes Eschar, Subcutaneous Tissue, and Slough after achieving pain control using Lidocaine 5% topical ointment. No specimens were taken. A time out was conducted at 10:00, prior to the start of the procedure. A Minimum amount of bleeding was controlled with Pressure. The procedure was tolerated well with a pain level of 0 throughout and a pain level of 0 following the procedure. Post Debridement Measurements: 3.5cm length x 3.3cm width x 0.2cm depth; 1.814cm^3 volume. Character of Wound/Ulcer Post Debridement is improved. Post procedure Diagnosis Wound #7: Same as  Pre-Procedure General Notes: Scribed for Dr. Celine Ahr by J.[name]. Pre-procedure diagnosis of Wound #7 is a Lymphedema located on the Left,Anterior Lower Leg . There was a Four Layer Compression Therapy Procedure by [name], RN. Post procedure Diagnosis Wound #7: Same as Pre-Procedure Plan Follow-up Appointments: Return Appointment in 1 week. - Dr. Celine Ahr Room 3 Anesthetic: (In clinic) Topical Lidocaine 5% applied to wound bed Bathing/ Shower/ Hygiene: May shower with protection but do not get wound dressing(s) wet. Protect dressing(s) with water repellant cover (for example, large plastic bag) or a cast cover and may then take shower. Edema Control - Lymphedema / SCD / Other: Avoid standing for long periods of time. Patient to wear own compression stockings every day. Exercise regularly WOUND #7: - Lower Leg Wound Laterality: Left, Anterior Cleanser: Soap and Water 1 x Per Week/30 Days Discharge Instructions: May shower and wash wound with dial antibacterial soap and water prior to dressing change. Cleanser: Wound Cleanser 1 x Per Week/30 Days Discharge Instructions: Cleanse the wound with wound cleanser prior to applying a clean dressing using gauze sponges, not tissue or cotton balls. Peri-Wound Care: Zinc Oxide Ointment 30g tube 1 x Per Week/30 Days Discharge Instructions: Apply Zinc Oxide to periwound with each dressing change Prim Dressing: Sorbal[employer] Dressing, 4x4 (in/in) 1 x Per Week/30 Days ary Discharge Instructions: Apply to wound bed as instructed Secondary Dressing: Woven Gauze Sponge, Non-Sterile 4x4 in 1 x Per Week/30 Days Discharge Instructions: Apply over primary dressing as directed. Secondary Dressing: Zetuvit Plus 4x8 in 1 x Per Week/30 Days Discharge Instructions: Apply over primary dressing as directed. Secured With: Transpore Surgical T ape, 2x10 (in/yd) 1 x Per Week/30 Days Discharge Instructions: Secure dressing with tape as directed. Com pression  Wrap: FourPress (4 layer compression wrap) 1 x Per Week/30 Days Discharge Instructions: Apply four layer compression as directed. May also use Miliken CoFlex 2 layer compression system as alternative. 03/13/2022: The wound measured a little bit smaller today. There is slough and eschar accumulation, as per usual. I used a curette to debride slough, eschar, and nonviable subcutaneous tissue from the wound. We will continue silver alginate and 4-layer compression. Follow-up in 1 week. Electronic Signature(s) Signed: 03/13/2022 10:18:33 AM By: [name] MD FACS Entered By: [name] on 03/13/2022 10:18:32 JAKIRA, MCFADDEN D ([id number]) [id number] Page 11 of 12 -------------------------------------------------------------------------------- HxROS Details Patient Name: Date of Service: TASHA, DIAZ 03/13/2022 9:30 A M Medical Record Number: [id number] Patient Account Number: [id number] Date of Birth/Sex: Treating RN: 1955/12/17 (67 y.o. F) Primary Care Provider: [name] Other Clinician: Referring Provider: Treating Provider/Extender: [name] in Treatment: 7 Information Obtained From Patient Constitutional Symptoms (General Health) Medical History: Past Medical History Notes: morbid obesity Eyes Medical History:  Positive for: Cataracts Hematologic/Lymphatic Medical History: Positive for: Lymphedema Cardiovascular Medical History: Positive for: Hypertension; Peripheral Venous Disease Past Medical History Notes: schamberg disease, hyperlipidemia Gastrointestinal Medical History: Past Medical History Notes: diverticulitis , h/o obstruction due to diverticulitis , colostomy and colostomy reversal Endocrine Medical History: Past Medical History Notes: hypothyroidism Integumentary (Skin) Medical History: Negative for: History of Burn Musculoskeletal Medical History: Positive for:  Osteoarthritis HBO Extended History Items Eyes: Cataracts Immunizations Pneumococcal Vaccine: Received Pneumococcal Vaccination: Yes Received Pneumococcal Vaccination On or After 60th Birthday: Yes Implantable Devices None FADUMO, HENG D ([id number]) [id number] Page 12 of 12 Hospitalization / Surgery History Type of Hospitalization/Surgery colostomy reversal 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 Family and Social History Cancer: No; Diabetes: No; Heart Disease: Yes - Mother,Father; Hereditary Spherocytosis: No; Hypertension: Yes - Mother,Father; Kidney Disease: Yes - Mother; Lung Disease: No; Seizures: No; Stroke: No; Thyroid Problems: Yes - Mother; Tuberculosis: No; Never smoker; Marital Status - Single; Alcohol Use: Never; Drug Use: No History; Caffeine Use: Daily - coffee; Financial Concerns: No; Food, Clothing or Shelter Needs: No; Support System Lacking: No; Transportation Concerns: No Electronic Signature(s) Signed: 03/13/2022 10:36:34 AM By: [name] MD FACS Entered By: [name] on 03/13/2022 10:17:06 -------------------------------------------------------------------------------- SuperBill Details Patient Name: Date of Service: Erin Face D. 03/13/2022 Medical Record Number: [id number] Patient Account Number: [id number] Date of Birth/Sex: Treating RN: 22-Mar-1955 (67 y.o. 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 Facility Procedures : CPT4 Code: 84696295 Description: 28413 - DEB SUBQ TISSUE 20 SQ CM/< ICD-10 Diagnosis Description L97.928 Non-pressure chronic ulcer of unspecified  part of left lower leg with other spec Modifier: ified severity Quantity: 1 Physician Procedures : CPT4 Code Description Modifier [id number] 99213 - WC PHYS LEVEL 3 - EST PT 25 ICD-10 Diagnosis Description L97.928 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

## 2022-03-13 NOTE — Progress Notes (Signed)
DANELY, BAYLISS ([id number]) [id number] Page 1 of 7 Visit Report for 03/13/2022 Arrival Information Details Patient Name: Date of Service: [name], [name] 03/13/2022 9:30 A M Medical Record Number: [id number] Patient Account Number: [id number] Date of Birth/Sex: Treating RN: [birth year] (67 y.o. [name] Primary Care Hairo Garraway: [name] Other Clinician: Referring [name]: Treating [name]/Extender: [name] in Treatment: 7 Visit Information History Since Last Visit Added or deleted any medications: No Patient Arrived: Cane Pain Present Now: No Arrival Time: 09:45 Accompanied By: self Transfer Assistance: None Patient Identification Verified: Yes Electronic Signature(s) Signed: 03/13/2022 12:31:32 PM By: Erin Catholic RN Entered By: [name] on 03/13/2022 10:05:19 -------------------------------------------------------------------------------- Compression Therapy Details Patient Name: Date of Service: Erin Face [name] 03/13/2022 9:30 A M Medical Record Number: [id number] Patient Account Number: [id number] Date of Birth/Sex: Treating RN: June 10, 1955 (67 y.o. [name] Primary Care [name]: [name] Other Clinician: Referring [name]: Treating [name]/Extender: [name] in Treatment: 7 Compression Therapy Performed for Wound Assessment: Wound #7 Left,Anterior Lower Leg Performed By: Clinician Erin Catholic, RN Compression Type: Four Layer Post Procedure Diagnosis Same as Pre-procedure Electronic Signature(s) Signed: 03/13/2022 12:31:32 PM By: Erin Catholic RN Entered By: [name] on 03/13/2022 10:08:07 -------------------------------------------------------------------------------- Encounter Discharge Information Details Patient Name: Date of Service: Erin Face [name] 03/13/2022 9:30 A M Medical Record Number: [id number] Patient  Account Number: [id number] Date of Birth/Sex: Treating RN: [birth year] (67 y.o. [name] Primary Care [name]: [name] Other Clinician: Referring [name]: Treating [name]/Extender: [name] in Treatment: [address] [place] [name] ([id number]) [id number] Page 2 of 7 Encounter Discharge Information Items Post Procedure Vitals Discharge Condition: Stable Temperature (F): 97.8 Ambulatory Status: Cane Pulse (bpm): 84 Discharge Destination: Home Respiratory Rate (breaths/min): 16 Transportation: Private Auto Blood Pressure (mmHg): 100/66 Accompanied By: self Schedule Follow-up Appointment: Yes Clinical Summary of Care: Patient Declined Electronic Signature(s) Signed: 03/13/2022 12:31:32 PM By: Erin Catholic RN Entered By: [name] on 03/13/2022 12:25:38 -------------------------------------------------------------------------------- Lower Extremity Assessment Details Patient Name: Date of Service: [name], [name] 03/13/2022 9:30 A M Medical Record Number: [id number] Patient Account Number: [id number] Date of Birth/Sex: Treating RN: 15-May-1955 (67 y.o. [name] Primary Care [name]: [name] Other Clinician: Referring [name]: Treating [name]/Extender: [name] in Treatment: 7 Edema Assessment Assessed: [Left: No] [Right: No] [Left: Edema] [Right: :] Calf Left: Right: Point of Measurement: From Medial Instep 38.5 cm Ankle Left: Right: Point of Measurement: From Medial Instep 20.5 cm Vascular Assessment Pulses: Dorsalis Pedis Palpable: [Left:Yes] Electronic Signature(s) Signed: 03/13/2022 12:31:32 PM By: Erin Catholic RN Entered By: [name] on 03/13/2022 10:06:04 -------------------------------------------------------------------------------- Multi Wound Chart Details Patient Name: Date of Service: Erin Face [name] 03/13/2022 9:30 A  M Medical Record Number: [id number] Patient Account Number: [id number] Date of Birth/Sex: Treating RN: 07-31-1955 (66 y.o. F) Primary Care [name]: [name] Other Clinician: Referring [name]: Treating [name]/Extender: [name] in Treatment: [address] [name], [name] ([id number]) [id number] Page 3 of 7 Height(in): 62 Pulse(bpm): 84 Weight(lbs): 222 Blood Pressure(mmHg): 100/66 Body Mass Index(BMI): 40.6 Temperature(F): 97.8 Respiratory Rate(breaths/min): 16 [7:Photos:] [N/A:N/A] Left, Anterior Lower Leg N/A N/A Wound Location: Blister N/A N/A Wounding Event: Lymphedema N/A N/A Primary Etiology: Cataracts, Lymphedema, N/A N/A Comorbid History: Hypertension, Peripheral Venous Disease, Osteoarthritis 01/22/2022 N/A N/A Date Acquired: 7 N/A N/A Weeks of Treatment: Open N/A N/A Wound Status: No N/A N/A Wound Recurrence: 3.5x3.3x0.2 N/A N/A Measurements L x W  x [name] 9.071 N/A N/A A (cm) : rea 1.814 N/A N/A Volume (cm) : -191.70% N/A N/A % Reduction in A rea: -191.60% N/A N/A % Reduction in Volume: Full Thickness Without Exposed N/A N/A Classification: Support Structures Medium N/A N/A Exudate A mount: Serosanguineous N/A N/A Exudate Type: red, [name] N/A N/A Exudate Color: Distinct, outline attached N/A N/A Wound Margin: Small (1-33%) N/A N/A Granulation A mount: Red N/A N/A Granulation Quality: Large (67-100%) N/A N/A Necrotic A mount: Eschar, Adherent Slough N/A N/A Necrotic Tissue: Fat Layer (Subcutaneous Tissue): Yes N/A N/A Exposed Structures: Fascia: No Tendon: No Muscle: No Joint: No Bone: No Small (1-33%) N/A N/A Epithelialization: Debridement - Excisional N/A N/A Debridement: Pre-procedure Verification/Time Out 10:00 N/A N/A Taken: Lidocaine 5% topical ointment N/A N/A Pain Control: Necrotic/Eschar, Subcutaneous, N/A N/A Tissue  Debrided: Slough Skin/Subcutaneous Tissue N/A N/A Level: 11.55 N/A N/A Debridement A (sq cm): rea Curette N/A N/A Instrument: Minimum N/A N/A Bleeding: Pressure N/A N/A Hemostasis Achieved: 0 N/A N/A Procedural Pain: 0 N/A N/A Post Procedural Pain: Debridement Treatment Response: Procedure was tolerated well N/A N/A Post Debridement Measurements L x 3.5x3.3x0.2 N/A N/A W x [name] 1.814 N/A N/A Post Debridement Volume: (cm) No Abnormalities Noted N/A N/A Periwound Skin Texture: Maceration: Yes N/A N/A Periwound Skin Moisture: Dry/Scaly: No Erythema: Yes N/A N/A Periwound Skin Color: Hemosiderin Staining: Yes Circumferential N/A N/A Erythema Location: No Abnormality N/A N/A Temperature: Compression Therapy N/A N/A Procedures Performed: Debridement Treatment Notes Electronic Signature(s) Signed: 03/13/2022 10:15:14 AM By: [name] MD FACS [place], Doroteo Bradford [name] ([id number]) [id number] Page 4 of 7 Entered By: [name] on 03/13/2022 10:15:13 -------------------------------------------------------------------------------- Multi-Disciplinary Care Plan Details Patient Name: Date of Service: [name], [name] 03/13/2022 9:30 A M Medical Record Number: [id number] Patient Account Number: [id number] Date of Birth/Sex: Treating RN: 12-Jan-1956 (67 y.o. [name] Primary Care Daejon Lich: [name] Other Clinician: Referring Ukiah Trawick: Treating Hannah Strader/Extender: [name] in Treatment: 7 Active Inactive Abuse / Safety / Falls / Self Care Management Nursing Diagnoses: History of Falls Impaired physical mobility Goals: Patient/caregiver will identify factors that restrict self-care and home management Date Initiated: 01/22/2022 Target Resolution Date: 06/12/2022 Goal Status: Active Interventions: Assess fall risk on admission and as needed Assess: immobility, friction, shearing, incontinence  upon admission and as needed Notes: Wound/Skin Impairment Nursing Diagnoses: Impaired tissue integrity Knowledge deficit related to ulceration/compromised skin integrity Goals: Patient/caregiver will verbalize understanding of skin care regimen Date Initiated: 01/22/2022 Target Resolution Date: 06/12/2022 Goal Status: Active Interventions: Assess ulceration(s) every visit Treatment Activities: Skin care regimen initiated : 01/22/2022 Topical wound management initiated : 01/22/2022 Notes: Electronic Signature(s) Signed: 03/13/2022 12:31:32 PM By: Erin Catholic RN Entered By: [name] on 03/13/2022 12:24:29 -------------------------------------------------------------------------------- Pain Assessment Details Patient Name: Date of Service: Erin Face [name] 03/13/2022 9:30 A M Medical Record Number: [id number] Patient Account Number: [id number] Date of Birth/Sex: Treating RN: [birth year] (67 y.o. [address], [place], [place] [name] ([id number]) [phone].pdf Page 5 of 7 Primary Care Lasonia Casino: [name] Other Clinician: Referring Dalton Mille: Treating Tylena Prisk/Extender: [name] in Treatment: 7 Active Problems Location of Pain Severity and Description of Pain Patient Has Paino No Site Locations Pain Management and Medication Current Pain Management: Electronic Signature(s) Signed: 03/13/2022 12:31:32 PM By: Erin Catholic RN Entered By: [name] on 03/13/2022 10:05:57 -------------------------------------------------------------------------------- Patient/Caregiver Education Details Patient Name: Date of Service: [name] 1/30/2024andnbsp9:30 [place] Record Number: [id number] Patient Account Number: [id number] Date of Birth/Gender: Treating  RN: March 20, 1955 (67 y.o. [name] Primary Care Physician: [name] Other Clinician: Referring Physician: Treating Physician/Extender:  [name] in Treatment: 7 Education Assessment Education Provided To: Patient Education Topics Provided Wound/Skin Impairment: Methods: Explain/Verbal Responses: Return demonstration correctly Electronic Signature(s) Signed: 03/13/2022 12:31:32 PM By: Erin Catholic RN Entered By: [name] on 03/13/2022 12:24:41 [name], [name] ([id number]) [id number] Page 6 of 7 -------------------------------------------------------------------------------- Wound Assessment Details Patient Name: Date of Service: [name], [name] 03/13/2022 9:30 A M Medical Record Number: [id number] Patient Account Number: [id number] Date of Birth/Sex: Treating RN: Jun 27, 1955 (67 y.o. [name] Primary Care Kortlynn Poust: [name] Other Clinician: Referring Adeleine Pask: Treating [place] Sigmon/Extender: [name] in Treatment: 7 Wound Status Wound Number: 7 Primary Lymphedema Etiology: Wound Location: Left, Anterior Lower Leg Wound Open Wounding Event: Blister Status: Date Acquired: 01/22/2022 Comorbid Cataracts, Lymphedema, Hypertension, Peripheral Venous Weeks Of Treatment: 7 History: Disease, Osteoarthritis Clustered Wound: No Photos Wound Measurements Length: (cm) 3.5 Width: (cm) 3.3 Depth: (cm) 0.2 Area: (cm) 9.071 Volume: (cm) 1.814 % Reduction in Area: -191.7% % Reduction in Volume: -191.6% Epithelialization: Small (1-33%) Tunneling: No Undermining: No Wound Description Classification: Full Thickness Without Exposed Suppor Wound Margin: Distinct, outline attached Exudate Amount: Medium Exudate Type: Serosanguineous Exudate Color: red, [name] t Structures Foul Odor After Cleansing: No Slough/Fibrino Yes Wound Bed Granulation Amount: Small (1-33%) Exposed Structure Granulation Quality: Red Fascia Exposed: No Necrotic Amount: Large (67-100%) Fat Layer (Subcutaneous Tissue) Exposed:  Yes Necrotic Quality: Eschar, Adherent Slough Tendon Exposed: No Muscle Exposed: No Joint Exposed: No Bone Exposed: No Periwound Skin Texture Texture Color No Abnormalities Noted: Yes No Abnormalities Noted: No Erythema: Yes Moisture Erythema Location: Circumferential No Abnormalities Noted: No Hemosiderin Staining: Yes Dry / Scaly: No Maceration: Yes Temperature / Pain Temperature: No Abnormality Treatment Notes Wound #7 (Lower Leg) Wound Laterality: Left, Anterior [name], [name] ([id number]) [id number].pdf Page 7 of 7 Cleanser Soap and Water Discharge Instruction: May shower and wash wound with dial antibacterial soap and water prior to dressing change. Wound Cleanser Discharge Instruction: Cleanse the wound with wound cleanser prior to applying a clean dressing using gauze sponges, not tissue or cotton balls. Peri-Wound Care Zinc Oxide Ointment 30g tube Discharge Instruction: Apply Zinc Oxide to periwound with each dressing change Topical Primary Dressing Sorbalgon AG Dressing, 4x4 (in/in) Discharge Instruction: Apply to wound bed as instructed Secondary Dressing Woven Gauze Sponge, Non-Sterile 4x4 in Discharge Instruction: Apply over primary dressing as directed. Zetuvit Plus 4x8 in Discharge Instruction: Apply over primary dressing as directed. Secured With Transpore Surgical Tape, 2x10 (in/yd) Discharge Instruction: Secure dressing with tape as directed. Compression Wrap FourPress (4 layer compression wrap) Discharge Instruction: Apply four layer compression as directed. May also use Miliken CoFlex 2 layer compression system as alternative. Compression Stockings Add-Ons Electronic Signature(s) Signed: 03/13/2022 12:31:32 PM By: Erin Catholic RN Entered By: [name] on 03/13/2022 09:59:24 -------------------------------------------------------------------------------- Vitals Details Patient Name: Date of Service: Erin Face [name] 03/13/2022 9:30 A M Medical Record Number: [id number] Patient Account Number: [id number] Date of Birth/Sex: Treating RN: 11/23/1955 (67 y.o. [name] Primary Care Takita Riecke: [name] Other Clinician: Referring Ting Cage: Treating Jhon Mallozzi/Extender: [name] in Treatment: 7 Vital Signs Time Taken: 09:46 Temperature (F): 97.8 Height (in): 62 Pulse (bpm): 84 Weight (lbs): 222 Respiratory Rate (breaths/min): 16 Body Mass Index (BMI): 40.6 Blood Pressure (mmHg): 100/66 Reference Range: 80 - 120 mg / dl Electronic Signature(s) Signed: 03/13/2022 12:31:32 PM By: Erin Catholic  RN Entered By: [name] on 03/13/2022 10:05:51

## 2022-03-17 ENCOUNTER — Other Ambulatory Visit: Payer: Self-pay | Admitting: Nurse Practitioner

## 2022-03-19 ENCOUNTER — Other Ambulatory Visit (HOSPITAL_COMMUNITY): Payer: Self-pay

## 2022-03-19 MED ORDER — MELOXICAM 7.5 MG PO TABS
7.5000 mg | ORAL_TABLET | Freq: Every day | ORAL | 0 refills | Status: DC
  Filled 2022-03-19: qty 30, 30d supply, fill #0

## 2022-03-20 ENCOUNTER — Encounter (HOSPITAL_BASED_OUTPATIENT_CLINIC_OR_DEPARTMENT_OTHER): Payer: Commercial Managed Care - PPO | Attending: General Surgery | Admitting: General Surgery

## 2022-03-20 DIAGNOSIS — I739 Peripheral vascular disease, unspecified: Secondary | ICD-10-CM | POA: Insufficient documentation

## 2022-03-20 DIAGNOSIS — E785 Hyperlipidemia, unspecified: Secondary | ICD-10-CM | POA: Diagnosis not present

## 2022-03-20 DIAGNOSIS — L97928 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: Secondary | ICD-10-CM | POA: Insufficient documentation

## 2022-03-20 DIAGNOSIS — I10 Essential (primary) hypertension: Secondary | ICD-10-CM | POA: Insufficient documentation

## 2022-03-20 DIAGNOSIS — E039 Hypothyroidism, unspecified: Secondary | ICD-10-CM | POA: Diagnosis not present

## 2022-03-20 DIAGNOSIS — I890 Lymphedema, not elsewhere classified: Secondary | ICD-10-CM | POA: Diagnosis not present

## 2022-03-20 DIAGNOSIS — I872 Venous insufficiency (chronic) (peripheral): Secondary | ICD-10-CM | POA: Insufficient documentation

## 2022-03-20 NOTE — Progress Notes (Signed)
[name] ([id number]) [id number] Page 1 of 8 Visit Report for 03/20/2022 Arrival Information Details Patient Name: Date of Service: [name], [name] 03/20/2022 9:30 A M Medical Record Number: [id number] Patient Account Number: [id number] Date of Birth/Sex: Treating RN: 22-Jul-1955 (67 y.o. [name] Primary Care Tressy Kunzman: [name] Other Clinician: Referring Kamaron Deskins: Treating Maurianna Benard/Extender: [name] in Treatment: 8 Visit Information History Since Last Visit Added or deleted any medications: No Patient Arrived: [name] Any new allergies or adverse reactions: No Arrival Time: 09:46 Had a fall or experienced change in No Accompanied By: self activities of daily living that may affect Transfer Assistance: Manual risk of falls: Patient Identification Verified: Yes Signs or symptoms of abuse/neglect since last visito No Hospitalized since last visit: No Implantable device outside of the clinic excluding No cellular tissue based products placed in the center since last visit: Has Dressing in Place as Prescribed: Yes Has Compression in Place as Prescribed: Yes Pain Present Now: No Electronic Signature(s) Signed: 03/20/2022 3:40:47 PM By: [name] RN Entered By: [name] on 03/20/2022 09:49:52 -------------------------------------------------------------------------------- Compression Therapy Details Patient Name: Date of Service: Erin Face [name] 03/20/2022 9:30 A M Medical Record Number: [id number] Patient Account Number: [id number] Date of Birth/Sex: Treating RN: 04/12/1955 (67 y.o. [name] Primary Care Maaz Spiering: [name] Other Clinician: Referring Sayward Horvath: Treating Arda Daggs/Extender: [name] in Treatment: 8 Compression Therapy Performed for Wound Assessment: Wound #7 Left,Anterior Lower Leg Performed By: Clinician [name],  RN Compression Type: Three Layer Post Procedure Diagnosis Same as Pre-procedure Electronic Signature(s) Signed: 03/20/2022 3:40:47 PM By: [name] RN Entered By: [name] on 03/20/2022 15:38:24 [name] ([id number]) [phone].pdf Page 2 of 8 -------------------------------------------------------------------------------- Encounter Discharge Information Details Patient Name: Date of Service: [name] 03/20/2022 9:30 A M Medical Record Number: [id number] Patient Account Number: [id number] Date of Birth/Sex: Treating RN: [birth year] (67 y.o. [name] Primary Care Lyris Hitchman: [name] Other Clinician: Referring Emri Sample: Treating Channah Godeaux/Extender: [name] in Treatment: 8 Encounter Discharge Information Items Post Procedure Vitals Discharge Condition: Stable Temperature (F): 97.6 Ambulatory Status: Cane Pulse (bpm): 76 Discharge Destination: Home Respiratory Rate (breaths/min): 16 Transportation: Private Auto Blood Pressure (mmHg): 120/77 Accompanied By: self Schedule Follow-up Appointment: Yes Clinical Summary of Care: Patient Declined Electronic Signature(s) Signed: 03/20/2022 3:40:47 PM By: [name] RN Entered By: [name] on 03/20/2022 15:39:24 -------------------------------------------------------------------------------- Lower Extremity Assessment Details Patient Name: Date of Service: [name] 03/20/2022 9:30 A M Medical Record Number: [id number] Patient Account Number: [id number] Date of Birth/Sex: Treating RN: Sep 03, 1955 (67 y.o. [name] Primary Care Hassani Sliney: [name] Other Clinician: Referring Rima Blizzard: Treating Jaunice Mirza/Extender: [name] in Treatment: 8 Edema Assessment Assessed: [Left: No] [Right: No] [Left: Edema] [Right: :] Calf Left: Right: Point of Measurement: From Medial Instep 38  cm Ankle Left: Right: Point of Measurement: From Medial Instep 20.2 cm Vascular Assessment Pulses: Dorsalis Pedis Palpable: [Left:Yes] Electronic Signature(s) Signed: 03/20/2022 3:40:47 PM By: [name] RN Entered By: [name] on 03/20/2022 09:51:03 Multi Wound Chart Details -------------------------------------------------------------------------------- [name] ([id number]) [id number] Page 3 of 8 Patient Name: Date of Service: [name], [name] 03/20/2022 9:30 A M Medical Record Number: [id number] Patient Account Number: [id number] Date of Birth/Sex: Treating RN: [birth year] (67 y.o. F) Primary Care Jaquana Geiger: [name] Other Clinician: Referring Magdalena Skilton: Treating Perl Kerney/Extender: [name] in Treatment: 8 Vital Signs Height(in): 62 Pulse(bpm): 76 Weight(lbs):  222 Blood Pressure(mmHg): 120/77 Body Mass Index(BMI): 40.6 Temperature(F): 97.6 Respiratory Rate(breaths/min): 16 Wound Assessments Wound Number: 7 N/A N/A Photos: N/A N/A Left, Anterior Lower Leg N/A N/A Wound Location: Blister N/A N/A Wounding Event: Lymphedema N/A N/A Primary Etiology: Cataracts, Lymphedema, N/A N/A Comorbid History: Hypertension, Peripheral Venous Disease, Osteoarthritis 01/22/2022 N/A N/A Date Acquired: 8 N/A N/A Weeks of Treatment: Open N/A N/A Wound Status: No N/A N/A Wound Recurrence: 3.5x2.1x0.2 N/A N/A Measurements L x W x [name] 5.773 N/A N/A A (cm) : rea 1.155 N/A N/A Volume (cm) : -85.60% N/A N/A % Reduction in A rea: -85.70% N/A N/A % Reduction in Volume: Full Thickness Without Exposed N/A N/A Classification: Support Structures Medium N/A N/A Exudate A mount: Serosanguineous N/A N/A Exudate Type: red, brown N/A N/A Exudate Color: Distinct, outline attached N/A N/A Wound Margin: Small (1-33%) N/A N/A Granulation A mount: Pink N/A N/A Granulation Quality: Large (67-100%)  N/A N/A Necrotic A mount: Eschar, Adherent Slough N/A N/A Necrotic Tissue: Fat Layer (Subcutaneous Tissue): Yes N/A N/A Exposed Structures: Fascia: No Tendon: No Muscle: No Joint: No Bone: No Small (1-33%) N/A N/A Epithelialization: Debridement - Selective/Open Wound N/A N/A Debridement: Pre-procedure Verification/Time Out 09:55 N/A N/A Taken: Lidocaine 5% topical ointment N/A N/A Pain Control: Slough N/A N/A Tissue Debrided: Non-Viable Tissue N/A N/A Level: 7.35 N/A N/A Debridement A (sq cm): rea Curette N/A N/A Instrument: Minimum N/A N/A Bleeding: Pressure N/A N/A Hemostasis A chieved: 0 N/A N/A Procedural Pain: 0 N/A N/A Post Procedural Pain: Procedure was tolerated well N/A N/A Debridement Treatment Response: 3.5x2.1x0.2 N/A N/A Post Debridement Measurements L x W x [name] 1.155 N/A N/A Post Debridement Volume: (cm) No Abnormalities Noted N/A N/A Periwound Skin Texture: Maceration: Yes N/A N/A Periwound Skin Moisture: Dry/Scaly: No Erythema: Yes N/A N/A Periwound Skin Color: Hemosiderin Staining: Yes Circumferential N/A N/A Erythema Location: No Abnormality N/A N/A Temperature: Debridement N/A N/A Procedures Performed: [name], [name] ([id number]) [phone].pdf Page 4 of 8 Treatment Notes Electronic Signature(s) Signed: 03/20/2022 10:29:03 AM By: [name] MD FACS Entered By: [name] on 03/20/2022 10:29:03 -------------------------------------------------------------------------------- Multi-Disciplinary Care Plan Details Patient Name: Date of Service: Erin Face [name] 03/20/2022 9:30 A M Medical Record Number: [id number] Patient Account Number: [id number] Date of Birth/Sex: Treating RN: [birth year] (67 y.o. [name] Primary Care Miles Leyda: [name] Other Clinician: Referring Philipp Callegari: Treating Johnchristopher Sarvis/Extender: [name] in Treatment: 8 Active  Inactive Abuse / Safety / Falls / Self Care Management Nursing Diagnoses: History of Falls Impaired physical mobility Goals: Patient/caregiver will identify factors that restrict self-care and home management Date Initiated: 01/22/2022 Target Resolution Date: 06/12/2022 Goal Status: Active Interventions: Assess fall risk on admission and as needed Assess: immobility, friction, shearing, incontinence upon admission and as needed Notes: Wound/Skin Impairment Nursing Diagnoses: Impaired tissue integrity Knowledge deficit related to ulceration/compromised skin integrity Goals: Patient/caregiver will verbalize understanding of skin care regimen Date Initiated: 01/22/2022 Target Resolution Date: 06/12/2022 Goal Status: Active Interventions: Assess ulceration(s) every visit Treatment Activities: Skin care regimen initiated : 01/22/2022 Topical wound management initiated : 01/22/2022 Notes: Electronic Signature(s) Signed: 03/20/2022 3:40:47 PM By: [name] RN Entered By: [name] on 03/20/2022 15:37:37 [name] ([id number]) [id number] Page 5 of 8 -------------------------------------------------------------------------------- Pain Assessment Details Patient Name: Date of Service: [name], [name] 03/20/2022 9:30 A M Medical Record Number: [id number] Patient Account Number: [id number] Date of Birth/Sex: Treating RN: 1955-03-18 (67 y.o. [name] Primary Care Jermia Rigsby: [name] Other Clinician: Referring Taray Normoyle:  Treating Dallys Nowakowski/Extender: [name] in Treatment: 8 Active Problems Location of Pain Severity and Description of Pain Patient Has Paino No Site Locations Pain Management and Medication Current Pain Management: Electronic Signature(s) Signed: 03/20/2022 3:40:47 PM By: [name] RN Entered By: [name] on 03/20/2022  09:50:43 -------------------------------------------------------------------------------- Patient/Caregiver Education Details Patient Name: Date of Service: [name] 2/6/2024andnbsp9:30 A M Medical Record Number: [id number] Patient Account Number: [id number] Date of Birth/Gender: Treating RN: 28-Jun-1955 (68 y.o. [name] Primary Care Physician: [name] Other Clinician: Referring Physician: Treating Physician/Extender: [name] in Treatment: 8 Education Assessment Education Provided To: Patient Education Topics Provided Wound/Skin Impairment: Methods: Explain/Verbal Responses: Return demonstration correctly GISSEL, KEILMAN ([id number]) [phone].pdf Page 6 of 8 Electronic Signature(s) Signed: 03/20/2022 3:40:47 PM By: [name] RN Entered By: [name] on 03/20/2022 15:37:54 -------------------------------------------------------------------------------- Wound Assessment Details Patient Name: Date of Service: [name], [name] 03/20/2022 9:30 A M Medical Record Number: [id number] Patient Account Number: [id number] Date of Birth/Sex: Treating RN: [birth year] (67 y.o. [name] Primary Care Abhishek Levesque: [name] Other Clinician: Referring Katielynn Horan: Treating Hassel Uphoff/Extender: [name] in Treatment: 8 Wound Status Wound Number: 7 Primary Lymphedema Etiology: Wound Location: Left, Anterior Lower Leg Wound Open Wounding Event: Blister Status: Date Acquired: 01/22/2022 Comorbid Cataracts, Lymphedema, Hypertension, Peripheral Venous Weeks Of Treatment: 8 History: Disease, Osteoarthritis Clustered Wound: No Photos Wound Measurements Length: (cm) 3.5 Width: (cm) 2.1 Depth: (cm) 0.2 Area: (cm) 5.773 Volume: (cm) 1.155 % Reduction in Area: -85.6% % Reduction in Volume: -85.7% Epithelialization: Small (1-33%) Tunneling: No Undermining:  No Wound Description Classification: Full Thickness Without Exposed Suppor Wound Margin: Distinct, outline attached Exudate Amount: Medium Exudate Type: Serosanguineous Exudate Color: red, brown t Structures Foul Odor After Cleansing: No Slough/Fibrino Yes Wound Bed Granulation Amount: Small (1-33%) Exposed Structure Granulation Quality: Pink Fascia Exposed: No Necrotic Amount: Large (67-100%) Fat Layer (Subcutaneous Tissue) Exposed: Yes Necrotic Quality: Eschar, Adherent Slough Tendon Exposed: No Muscle Exposed: No Joint Exposed: No Bone Exposed: No Periwound Skin Texture Texture Color No Abnormalities Noted: Yes No Abnormalities Noted: No Erythema: Yes Moisture Erythema Location: Circumferential No Abnormalities Noted: No Hemosiderin Staining: Yes Dry / Scaly: No [name], [name] ([id number]) [phone].pdf Page 7 of 8 Maceration: Yes Temperature / Pain Temperature: No Abnormality Treatment Notes Wound #7 (Lower Leg) Wound Laterality: Left, Anterior Cleanser Soap and Water Discharge Instruction: May shower and wash wound with dial antibacterial soap and water prior to dressing change. Wound Cleanser Discharge Instruction: Cleanse the wound with wound cleanser prior to applying a clean dressing using gauze sponges, not tissue or cotton balls. Peri-Wound Care Zinc Oxide Ointment 30g tube Discharge Instruction: Apply Zinc Oxide to periwound with each dressing change Sween Lotion (Moisturizing lotion) Discharge Instruction: Apply moisturizing lotion as directed Topical Mupirocin Ointment Discharge Instruction: Apply Mupirocin (Bactroban) as instructed Primary Dressing Sorbalgon AG Dressing, 4x4 (in/in) Discharge Instruction: Apply to wound bed as instructed Secondary Dressing Woven Gauze Sponge, Non-Sterile 4x4 in Discharge Instruction: Apply over primary dressing as directed. Zetuvit Plus 4x8 in Discharge Instruction: Apply over primary  dressing as directed. Secured With Transpore Surgical Tape, 2x10 (in/yd) Discharge Instruction: Secure dressing with tape as directed. Compression Wrap ThreePress (3 layer compression wrap) Discharge Instruction: Apply three layer compression as directed. Netting #5 Compression Stockings Add-Ons Electronic Signature(s) Signed: 03/20/2022 3:40:47 PM By: [name] RN Entered By: [name] on 03/20/2022 09:55:10 -------------------------------------------------------------------------------- Vitals Details Patient Name: Date of Service: Erin Gerold NITA [name]  03/20/2022 9:30 A M Medical Record Number: [id number] Patient Account Number: [id number] Date of Birth/Sex: Treating RN: [birth year] (67 y.o. [name] Primary Care Juana Montini: [name] Other Clinician: Referring Adaleah Forget: Treating Irem Stoneham/Extender: [name] in Treatment: 8 Vital Signs Time Taken: 09:48 Temperature (F): 97.6 Height (in): 62 Pulse (bpm): 76 Weight (lbs): 222 Respiratory Rate (breaths/min): 16 Body Mass Index (BMI): 40.6 Blood Pressure (mmHg): 120/77 Reference Range: 80 - 120 mg / dl [name], [name] ([id number]) [phone].pdf Page 8 of 8 Electronic Signature(s) Signed: 03/20/2022 3:40:47 PM By: [name] RN Entered By: [name] on 03/20/2022 09:50:28

## 2022-03-20 NOTE — Progress Notes (Addendum)
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 Apply three layer compression as directed. Compression Wrap: Netting #5 1 x Per Week/30 Days Laboratory naerobe culture (MICRO) - Left Anterior Lower leg. - (ICD10 [id number] - Non-pressure chronic Bacteria identified in Unspecified specimen by A ulcer of unspecified part of left lower leg with other specified severity) LOINC Code: [id number] Convenience Name: Anaerobic culture Electronic Signature(s) Signed: 03/20/2022 1:45:10 PM By: [name] MD FACS [place], [place] [name] ([id number]) [id number] Page 6 of 12 Signed: 03/20/2022 3:40:47 PM By: [name] RN Previous Signature: 03/20/2022 11:19:40 AM Version By: [name] MD FACS Entered By: [name] on 03/20/2022 13:17:22 -------------------------------------------------------------------------------- Problem List Details Patient Name: Date of Service: Erin Face [name] 03/20/2022 9:30 A M Medical Record Number: [id number] Patient Account Number: [id number] Date of Birth/Sex: Treating RN: April 22, 1955 (67 y.o. 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 03/20/2022 9:30 A M Medical Record Number: [id number] Patient Account Number: [id number] Date of Birth/Sex: Treating RN: 05/27/1955 (67 y.o. F) Primary Care Provider: [name] Other Clinician: Referring Provider: Treating Provider/Extender: [name] in Treatment: 8 Subjective Chief Complaint Information obtained from Patient 04/19/2021: The patient is here for ongoing follow-up regarding 2 left lower extremity wounds. 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 She tells [name] that she fell on the gravel driveway in July. She developed injuries on her distal lower leg which have not healed. She saw her primary physician on 11/15/2017 who noted her left shin injuries. Gave her antibiotics. At that point the wounds were almost circumferential however most were less than 1.5 cm. Weeping edema fluid was noted. She was referred here for evaluation. The patient has a history of chronic lower extremity edema. 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 She has had prior history with leg weeping fluid. She does not wear compression stockings. She is not doing anything specific to these wound areas. 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 She also has lymphedema. Put her in 3 layer compression and silver alginate last week. She has 3 small wounds with depth just lateral to the tibia. More problematically than this she has numerous shallow areas some of which are almost canal like in shape with tightly adherent painful debris. It would be very difficult and time- consuming to go through this and attempt to individually debride all these areas.. I changed her to collagen today to see if that would help with any of the surface debris on some of these wounds. Otherwise we will not be able to put this in compression we have to have someone change the dressing. The patient [name], [name] ([id number]) [id number] Page 7 of 12 is not eligible for home health 12/25/17 on evaluation today patient actually appears to be doing rather well in regard to the ulcer on her lower extremity. Fortunately there does not appear to be evidence of infection at this time. She has been tolerating the dressing changes without complication. This includes the compression wrap. The only issue she had was that the wrap was initially placed over her bunion region which actually calls her some discomfort and pain. Other than that things seem to be going rather well. 01/01/2018 Seen today for follow-up and management of left lower extremity wound and lymphedema. T  oday she presents with a new wound towards to the left lateral LE. Recently treated with a 7 day course of amoxicillin; reason for antibiotic dose is unknown at this time. Tolerating current treatment of collagen with 4- layer wraps. She obtained a venous reflux study on 12/25/17. Studies show on the right abnormal reflux times of the popliteal vein, great saphenous vein at the saphenofemoral junction at the proximal thigh, great saphenous vein at the mid calf, and origin of the small saphenous vein.No superficial thrombosis. No deep vein thrombosis in the common femoral, femoral,and popliteal veins. Left abnormal reflex times as well of the common femoral vein, popliteal vein, and a great saphenous vein at the saphenofemoral junction, In great saphenous vein at the mid thigh w/o thrombosis. Has any issues or concerns during visit today. Recommended follow-up to vascular specialist due to abnormalities from the venous reflux study. Denies fever, pain, chills, dizziness, nausea, or vomiting. 01/08/18 upon evaluation today the patient actually seems to be showing some signs of improvement in my opinion at this point in regard to the lower extremity ulcerated areas. She still has a lot of drainage but fortunately nothing that appears to be too significant currently. I have been very happy with the overall progress I see today compared to where things were during the last evaluation that I had with her. Nonetheless she has not had her appointment with the vein specialist as of yet in fact we were able to get this approved for her today and confirmed with them she will be seeing them on December 26. Nonetheless in general I do feel like the compression wraps is doing well for her. 01/17/18; quite a bit of improvement since last time  I saw this patient she has a small open area remaining on the left lateral calf and even smaller area medially. She has lymphedema chronic stasis changes with distal skin  fibrosis. She has an appointment with vascular surgery later this month 01/24/2018; the patient's medial leg has closed. Still a small open area on the left lateral leg. She states that the 4 layer compression we put on last week was too tight and she had to take it off over a few days ago. We have had resultant increase in her lymphedema in the dorsal foot and a proximal calf. Fortunately that does not seem to have resulted in any deterioration in her wounds The patient is going to need compression stockings. We have given her measurements to phone elastic therapy in [place]. She has vascular surgery consult on December 26 02/07/18; she's had continuous contraction on the left lateral leg wound which is now very small. Currently using silver alginate under 3 layer compression She saw Dr. Trula Slade of vascular surgery on 02/06/18. It was noted that she had a normal reflux times in the popliteal vein, great saphenous vein at the saphenofemoral junction, great saphenous vein at the proximal thigh great saphenous vein at the mid calf and origin of the small saphenous vein. It was noted that she had significant reflux in the left saphenous veins with diameter measurements in the 0.7-0.8 cm range. It was felt she would benefit from laser ablation to help minimize the risk of ulcer recurrence. It was recommended that she wear 20-30 thigh-high compression stockings and have follow-up in 4-6 weeks 02/17/2018; I thought this lady would be healed however her compression slipped down and she developed increasing swelling and the wound is actually larger. 1/13; we had deterioration last week after the patient's compression slipped down and she developed periwound swelling. I increased her compression before layers we have been using silver alginate we are a lot better again today. She has her compression stockings in waiting 1/23; the patient's wounds are totally healed today. She has her stockings. This was almost  circumferential skin damage. She follows up with Dr. Trula Slade of vascular surgery next Monday. READMISSION 02/21/2021 This is a now 67 year old woman that we had in clinic here discharging in January 2020 with wounds on her left calf chronic venous insufficiency. She was discharged with 30/40 stockings. It does not sound like she has worn stockings in about a year largely from not being able to get them on herself. In November she developed new blisters on her legs an area laterally is opened into a fairly sizable wound. She has weeping posteriorly as well. She has been using Neosporin and Band-Aids. The patient did see Dr. Trula Slade in 2019 and 2020. He felt she might benefit from laser ablation in her left saphenous veins although because of the wound that healed I do not think he went through with it. She might benefit from seeing him again. Her ABI on the left is 1. 1/17; patient's wound on the posterior left calf is closed she has the 2 large areas last week. We put her in 4-layer compression for edema control is a lot better. Our intake nurse noted greenish drainage and odor. We have been using silver alginate 1/25; PCR culture I did have the substantial wound area on the left lateral lower leg showed staff aureus and group A strep. Low titers of coag negative staph which are probably skin contaminants. Resistance detected to tetracycline methicillin and macrolides. I gave her a  starter kit of Nuzyra 150 mg x 3 for 2 days then 300 mg for a further 7 days. Marked odor considerable increase in surrounding erythema. We are using Iodoflex last week however I have changed to silver alginate with underlying Bactroban 2/1; she is completing her [place] tomorrow. The degree of erythema around the wounds looks a lot better. Odor has improved. I gave her silver alginate and Bactroban last week and changing her back to Iodoflex to continue with ongoing debridement 2/8; the periwound looks a lot better.  Surface of the wound also looks somewhat better although there is still ongoing debridement to be done we have been using Iodoflex under compression. Primary dressing and silver alginate 2/15; left posterior calf. Some improvement in the surface of the wound but still very gritty we have been using Iodoflex under compression. 04/05/2021: Left lateral posterior calf. She continues to have significant amounts of drainage, some of which is probably comprised of the Iodoflex microbleeds. There is no odor to the drainage. The satellite lesion on the more posterior aspect of the calf is epithelializing nicely and has contracted quite a bit. There is robust granulation tissue in the dominant wound with minimal adherent slough. 04/12/2021: The satellite lesion has nearly closed. She continues to have good granulation tissue with minimal slough of the larger primary wound. She continues to have a fair amount of drainage, but I think this is less secondary to switching her dressing from Iodoflex to Prisma. 04/19/2021: The satellite lesion is almost completely epithelialized. The larger primary wound continues to contract with good granulation tissue and minimal slough. She is currently in [place] with compression. 04/26/2021: The satellite lesion has closed. The larger primary wound has contracted further and the granulation tissue is robust without being hypertrophic. Minimal slough is present. 05/03/2021: The satellite lesion remains closed. The larger primary wound has a bit of slough, but has also contracted further with good perimeter epithelialization. Good granulation tissue at the wound surface. There was some greenish drainage on the dressing when it was removed; it is not the blue-green typically associated with Pseudomonas aeruginosa, however. 05/10/2021: The primary wound continues to contract. There is minimal slough. The granulation tissue at 9:00 is a bit hypertrophic. No significant drainage.  No odor. 05/17/2021: The wound is a little bit smaller today with good granulation tissue. Minimal slough. No significant drainage or odor. [name], [name] ([id number]) [id number] Page 8 of 12 05/24/2021: The wound continues to contract and has a nice base of granulation tissue. Small amount of slough. No concern for infection. 05/31/2021: For some reason, the wound measured slightly larger today but overall it still appears to be in good condition with a nice base of granulation tissue and minimal slough. 06/07/2021: The wound is smaller today. Good granulation tissue and minimal slough. 06/14/2021: The wound is unchanged in size. She continues to accumulate some slough. Good granulation tissue on the surface. 06/20/2021: The wound is smaller today. Minimal slough with good granulation tissue. 06/27/2021: The wound on her left lateral leg is smaller today with just a bit of slough and eschar accumulation. Unfortunately, she has opened a new superficial wound on her right lower extremity. She has 3+ pitting edema to the knees on that leg and she does not wear compression stockings. 07/04/2021: In addition to the superficial wound on her right lower extremity that she opened up last week, she has opened 3 additional sites on the same leg. Her compression wraps were clearly not in correct position when  she came to clinic today as they were at about the mid calf level rather than to the tibial tuberosity. She says that they slipped earlier and she had to come back on Friday to have them redone. The wound on her left lateral leg is perhaps slightly larger with some slough accumulation. 07/11/2021: The superficial wounds on her right lower extremity are nearly closed. They just have a thin layer of eschar overlying them. The wound on her left lateral leg is a little bit shallower and has a bit of slough accumulation. 07/17/2021: The right medial lower extremity leg wounds have almost  completely closed; one of them just has a tiny opening with a little bit of serous drainage. The left lateral leg wound is really unchanged. It seems to be stalled. 07/24/2021: The right leg wounds are completely closed. The left lateral leg wound is actually bigger today. It is a bit more tender. There is a minor accumulation of slough on the surface. 07/31/2021: The culture that I took last week was positive for MRSA. We added mupirocin under the silver alginate and I prescribed doxycycline. She has been tolerating this well. The wound is smaller today but does have slough accumulation. No significant pain or drainage. 08/07/2021: She completed her course of doxycycline. The wound looks much better today. It is smaller and the periwound is less inflamed. She does have some slough accumulation on the surface. 08/14/2021: The wound continues to contract. There is slough on the wound surface, but the periwound is intact without inflammation or induration. 08/21/2021: The wound is down to just 2 small open sites. They both have a bit of slough accumulation. 08/28/2021: The wound is down to just 1 small open site with a little bit of slough and eschar accumulation. 09/04/2021: The wound continues to contract but remains open. It is clean without any slough. 09/12/2021: No real change in the overall wound dimensions, but it is flush with the surrounding skin. There is a little bit of slough accumulation. Edema control is good. 09/22/2021: The wound is smaller and even more superficial. Minimal slough accumulation. Good control of edema. 09/29/2021: The wound continues to contract. She reports that it was a little bit "twingey" during the week. It is a little bit dry on inspection today. Light accumulation of slough. Good edema control. 10/06/2021: The wound is about the same size today. There is a little slough on the surface. Edema control is good. 10/13/2021: The wound is about the same size but more superficial. A  little bit of slough on the surface. 10/23/2021: The wound is slightly smaller and continues to fill in. Minimal slough on the wound surface. 10/30/2021: The wound continues to contract but has not yet closed. Light slough and eschar present. 11/06/2021: The wound persists, but seems a little bit more epithelialized. There is still slough and eschar accumulation. 11/14/2021: The wound continues to contract but persists. Light eschar around the wound. 11/22/2021: The wound is down to just a pinhole opening. There is eschar overlying the surface. Edema control is excellent. 11/29/2021: Her wound is closed. READMISSION 01/22/2022 This is a patient to be discharged in October. She has severe chronic venous insufficiency at that time she had a wound on her left lower leg posteriorly also the right leg at some point. These closed. We discharged her in 30/40 mm stockings from elastic therapy which she has been wearing religiously. She tells [name] that she was traumatized by a dolly while shopping at [place] about 2 to  3 weeks ago. She has been left with a left anterior lower leg wound. She comes in in another pair of stockings other than her 3040s. She does not have an arterial issue with her last ABI in the left at 1.2 12/18; this is a patient who has chronic venous insufficiency and recurrent venous insufficiency ulcers. She has a area on her left anterior lower leg and we readmitted her to the clinic last week. We use silver alginate and 4-layer compression. ABI was 1.2 She brought in her stocking which was a 20/30 below-knee stocking from elastic therapy. She may require a 30/40 mm equivalent stockings after this wound heals. 02/06/2022: The intake nurse reported an odor coming from the wound when she first unwrapped it but this abated after the leg was washed. There is thick layer of slough on the surface. 02/13/2022: The wound is cleaner today. It measured larger, but on visual inspection, I think some crust  of Iodoflex was included in the wound measurements; the actual wound itself looks about the same to slightly smaller. Edema control is good. 02/20/2022: The wound is cleaner again today. It is also measuring a little bit smaller, but there has been some moisture related periwound breakdown. Edema control is good. 02/27/2022: The intake nurse reported that the wound measures larger, but some of this ended up being crusted on Iodoflex. There is still some periwound moisture. Still with thick slough accumulation. Edema control is good. [name], [name] ([id number]) [id number] Page 9 of 12 03/06/2022: The wound is cleaner and more superficial, but did measure larger because of the inclusion of a satellite area. Still with some slough accumulation but less so than on prior visits. 03/13/2022: The wound measured a little bit smaller today. There is slough and eschar accumulation, as per usual. 03/20/2022: Her wound is larger and deeper today. The entire surface appears nonviable. There is more periwound erythema and threatened tissue breakdown. Patient History Information obtained from Patient. Family History Heart Disease - Mother,Father, Hypertension - Mother,Father, Kidney Disease - Mother, Thyroid Problems - Mother, No family history of Cancer, Diabetes, Hereditary Spherocytosis, Lung Disease, Seizures, Stroke, Tuberculosis. Social History Never smoker, Marital Status - Single, Alcohol Use - Never, Drug Use - No History, Caffeine Use - Daily - coffee. Medical History Eyes Patient has history of Cataracts Hematologic/Lymphatic Patient has history of Lymphedema Cardiovascular Patient has history of Hypertension, Peripheral Venous Disease Integumentary (Skin) Denies history of History of Burn Musculoskeletal Patient has history of Osteoarthritis Hospitalization/Surgery History - colostomy reversal. 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 Medical A Surgical History  Notes nd Constitutional Symptoms (General Health) morbid obesity Cardiovascular schamberg disease, hyperlipidemia Gastrointestinal diverticulitis , h/o obstruction due to diverticulitis , colostomy and colostomy reversal Endocrine hypothyroidism Objective Constitutional no acute distress. Vitals Time Taken: 9:48 AM, Height: 62 in, Weight: 222 lbs, BMI: 40.6, Temperature: 97.6 F, Pulse: 76 bpm, Respiratory Rate: 16 breaths/min, Blood Pressure: 120/77 mmHg. Respiratory Normal work of breathing on room air. General Notes: 03/20/2022: Her wound is larger and deeper today. The entire surface appears nonviable. There is more periwound erythema and threatened tissue breakdown. Integumentary (Hair, Skin) Wound #7 status is Open. Original cause of wound was Blister. The date acquired was: 01/22/2022. The wound has been in treatment 8 weeks. The wound is located on the Left,Anterior Lower Leg. The wound measures 3.5cm length x 2.1cm width x 0.2cm depth; 5.773cm^2 area and 1.155cm^3 volume. There is Fat Layer (Subcutaneous Tissue) exposed. There is no tunneling or undermining noted. There is  a medium amount of serosanguineous drainage noted. The wound margin is distinct with the outline attached to the wound base. There is small (1-33%) pink granulation within the wound bed. There is a large (67-100%) amount of necrotic tissue within the wound bed including Eschar and Adherent Slough. The periwound skin appearance had no abnormalities noted for texture. The periwound skin appearance exhibited: Maceration, Hemosiderin Staining, Erythema. The periwound skin appearance did not exhibit: Dry/Scaly. The surrounding wound skin color is noted with erythema which is circumferential. Periwound temperature was noted as No Abnormality. 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 There was a Excisional Skin/Subcutaneous Tissue Debridement with a total area of 7.35 sq cm performed by [name], MD. With the following instrument(s): Curette to remove Non-Viable tissue/material. Material removed includes Subcutaneous Tissue and Slough and after achieving pain control using Lidocaine 5% topical ointment. No specimens were taken. A time out was conducted at 09:55, prior to the start of the procedure. A Minimum amount of bleeding was controlled with Pressure. The procedure was tolerated well with a pain level of 0 throughout and a pain level of 0 following the procedure. Post Debridement Measurements: 3.5cm length x 2.1cm width x 0.2cm depth; 1.155cm^3 volume. Character of Wound/Ulcer Post Debridement is improved. Post procedure Diagnosis Wound #7: Same as Pre-Procedure General Notes: Scribed for Dr. Celine Ahr by J.[name]. Pre-procedure diagnosis of Wound #7 is a Lymphedema located on the Left,Anterior Lower Leg . There was a Three Layer Compression Therapy Procedure by [name], RN. Post procedure Diagnosis Wound #7: Same as Pre-Procedure Plan Follow-up Appointments: Return Appointment in 1 week. - Dr. Celine Ahr Room 3 Anesthetic: (In clinic) Topical Lidocaine 5% applied to wound bed Bathing/ Shower/ Hygiene: May shower with protection but do not get wound dressing(s) wet. Protect dressing(s) with water repellant cover (for example, large plastic bag) or a cast cover and may then take shower. Edema Control - Lymphedema / SCD / Other: Avoid standing for long periods of time. Patient to wear own compression stockings every day. Exercise regularly Laboratory ordered were: Anaerobic culture - Left Anterior Lower leg. WOUND #7: - Lower Leg  Wound Laterality: Left, Anterior Cleanser: Soap and Water 1 x Per Week/30 Days Discharge Instructions: May shower and wash wound with dial antibacterial soap and water prior to dressing change. Cleanser: Wound Cleanser 1 x Per Week/30 Days Discharge Instructions: Cleanse the wound with wound cleanser prior to applying a clean dressing using gauze sponges, not tissue or cotton balls. Peri-Wound Care: Zinc Oxide Ointment 30g tube 1 x Per Week/30 Days Discharge Instructions: Apply Zinc Oxide to periwound with each dressing change Peri-Wound Care: Sween Lotion (Moisturizing lotion) 1 x Per Week/30 Days Discharge Instructions: Apply moisturizing lotion as directed Topical: Mupirocin Ointment 1 x Per Week/30 Days Discharge Instructions: Apply Mupirocin (Bactroban) as instructed Prim Dressing: Sorbalgon AG Dressing, 4x4 (in/in) 1 x Per Week/30 Days ary Discharge Instructions: Apply to wound bed as instructed Secondary Dressing: Woven Gauze Sponge, Non-Sterile 4x4 in 1 x Per Week/30 Days Discharge Instructions: Apply over primary dressing as directed. Secondary Dressing: Zetuvit Plus 4x8 in 1 x Per Week/30 Days Discharge Instructions: Apply over primary dressing as directed. Secured With: Transpore Surgical T ape, 2x10 (in/yd) 1 x  Per Week/30 Days Discharge Instructions: Secure dressing with tape as directed. Com pression Wrap: ThreePress (3 layer compression wrap) 1 x Per Week/30 Days Discharge Instructions: Apply three layer compression as directed. Com pression Wrap: Netting #5 1 x Per Week/30 Days 03/20/2022: Her wound is larger and deeper today. The entire surface appears nonviable. There is more periwound erythema and threatened tissue breakdown. I used a curette to debride slough and nonviable subcutaneous tissue from her wound. I then took a culture. We will add topical mupirocin underneath the silver alginate and 4-layer compression. We will apply liberal zinc oxide to the periwound to avoid  moisture related tissue breakdown. Once her culture data return, I will adjust antibiotic therapy as indicated. Follow-up in 1 week. Electronic Signature(s) Signed: 04/01/2022 2:19:36 PM By: Deon Pilling RN, BSN Signed: 04/09/2022 5:14:33 PM By: [name] MD FACS Previous Signature: 03/20/2022 10:37:32 AM Version By: [name] MD FACS Entered By: Deon Pilling on 04/01/2022 14:01:00 -------------------------------------------------------------------------------- HxROS Details Patient Name: Date of Service: [name], [name] 03/20/2022 9:30 A Rulon Eisenmenger, [name] ([id number]) [id number] Page 11 of 12 Medical Record Number: [id number] Patient Account Number: [id number] Date of Birth/Sex: Treating RN: 1956-01-29 (67 y.o. F) Primary Care Provider: [name] Other Clinician: Referring Provider: Treating Provider/Extender: [name] in Treatment: 8 Information Obtained From Patient Constitutional Symptoms (General Health) Medical History: Past Medical History Notes: morbid obesity Eyes Medical History: Positive for: Cataracts Hematologic/Lymphatic Medical History: Positive for: Lymphedema Cardiovascular Medical History: Positive for: Hypertension; Peripheral Venous Disease Past Medical History Notes: schamberg disease, hyperlipidemia Gastrointestinal Medical History: Past Medical History Notes: diverticulitis , h/o obstruction due to diverticulitis , colostomy and colostomy reversal Endocrine Medical History: Past Medical History Notes: hypothyroidism Integumentary (Skin) Medical History: Negative for: History of Burn Musculoskeletal Medical History: Positive for: Osteoarthritis HBO Extended History Items Eyes: Cataracts Immunizations Pneumococcal Vaccine: Received Pneumococcal Vaccination: Yes Received Pneumococcal Vaccination On or After 60th Birthday: Yes Implantable  Devices None Hospitalization / Surgery History Type of Hospitalization/Surgery colostomy reversal 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 Family and Social History Cancer: No; Diabetes: No; Heart Disease: Yes - Mother,Father; Hereditary Spherocytosis: No; Hypertension: Yes - Mother,Father; Kidney Disease: Yes - Mother; Lung Disease: No; Seizures: No; Stroke: No; Thyroid Problems: Yes - Mother; Tuberculosis: No; Never smoker; Marital Status - Single; Alcohol Use: Never; Drug Use: No History; Caffeine Use: Daily - coffee; Financial Concerns: No; Food, Clothing or Shelter Needs: No; Support System Lacking: No; Transportation Concerns: No JAMEISHA, CANION ([id number]) [id number] Page 12 of 12 Electronic Signature(s) Signed: 03/20/2022 11:19:40 AM By: [name] MD FACS Entered By: [name] on 03/20/2022 10:30:07 -------------------------------------------------------------------------------- SuperBill Details Patient Name: Date of Service: Erin Gerold NITA [name] 03/20/2022 Medical Record Number: [id number] Patient Account Number: [id number] Date of Birth/Sex: Treating RN: 1955/07/12 (67 y.o. 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 with other spec Modifier: ified severity Quantity: 1 Physician Procedures : CPT4 Code Description Modifier [id number] - WC PHYS LEVEL 4 - EST PT 25 ICD-10 Diagnosis Description L97.928  Non-pressure chronic ulcer of unspecified part of  left lower leg with other specified severity I87.312 Chronic venous hypertension  (idiopathic) with ulcer of left lower extremity Quantity: 1 : [id number] - WC PHYS SUBQ TISS 20 SQ CM ICD-10 Diagnosis Description L97.928 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

## 2022-03-21 ENCOUNTER — Other Ambulatory Visit (HOSPITAL_COMMUNITY): Payer: Self-pay

## 2022-03-21 ENCOUNTER — Other Ambulatory Visit: Payer: Self-pay | Admitting: Nurse Practitioner

## 2022-03-21 MED ORDER — SIMVASTATIN 20 MG PO TABS
20.0000 mg | ORAL_TABLET | Freq: Every day | ORAL | 1 refills | Status: DC
  Filled 2022-03-21: qty 90, 90d supply, fill #0
  Filled 2022-06-15: qty 90, 90d supply, fill #1

## 2022-03-27 ENCOUNTER — Encounter (HOSPITAL_BASED_OUTPATIENT_CLINIC_OR_DEPARTMENT_OTHER): Payer: Commercial Managed Care - PPO | Admitting: General Surgery

## 2022-03-27 DIAGNOSIS — E039 Hypothyroidism, unspecified: Secondary | ICD-10-CM | POA: Diagnosis not present

## 2022-03-27 DIAGNOSIS — I739 Peripheral vascular disease, unspecified: Secondary | ICD-10-CM | POA: Diagnosis not present

## 2022-03-27 DIAGNOSIS — I872 Venous insufficiency (chronic) (peripheral): Secondary | ICD-10-CM | POA: Diagnosis not present

## 2022-03-27 DIAGNOSIS — E785 Hyperlipidemia, unspecified: Secondary | ICD-10-CM | POA: Diagnosis not present

## 2022-03-27 DIAGNOSIS — I10 Essential (primary) hypertension: Secondary | ICD-10-CM | POA: Diagnosis not present

## 2022-03-27 DIAGNOSIS — L97822 Non-pressure chronic ulcer of other part of left lower leg with fat layer exposed: Secondary | ICD-10-CM | POA: Diagnosis not present

## 2022-03-27 DIAGNOSIS — L97928 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: Secondary | ICD-10-CM | POA: Diagnosis not present

## 2022-03-27 DIAGNOSIS — I890 Lymphedema, not elsewhere classified: Secondary | ICD-10-CM | POA: Diagnosis not present

## 2022-03-28 NOTE — Progress Notes (Addendum)
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 2x10 (in/yd) 1 x Per Week/30 Days Discharge Instructions: Secure dressing with tape as directed. Compression Wrap: ThreePress (3 layer compression wrap) 1 x Per Week/30 Days Discharge Instructions: Apply three layer compression as directed. Compression Wrap: Netting #5 1 x Per Week/30 Days Electronic Signature(s) Signed: 03/27/2022 12:27:44 PM By: [name] MD FACS Previous Signature: 03/27/2022 10:00:34 AM Version By: Blanche East RN Entered By: [name] on 03/27/2022 10:14:46 -------------------------------------------------------------------------------- Problem List Details Patient Name: Date of Service: [name] 03/27/2022 9:30 A M Medical Record Number: [id number] Patient Account Number: [id number] Date of Birth/Sex: Treating RN: 1955-11-13 (67 y.o. 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 03/27/2022 9:30 A M Medical Record Number: [id number] Patient Account Number: [id number] Date of Birth/Sex: Treating RN: 01-Sep-1955 (67 y.o. F) Primary Care Provider: [name] Other Clinician: Referring Provider: Treating Provider/Extender: [name] in Treatment: [address] [name] ([id number]) [phone].pdf Page 7 of 13 Subjective Chief Complaint Information obtained from Patient 04/19/2021: The patient is here for ongoing follow-up regarding 2 left lower extremity wounds. 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 She tells [name] that she fell on the gravel driveway in July. She developed injuries on her distal lower leg which have not healed. She saw her primary physician on 11/15/2017 who noted her left shin injuries. Gave her antibiotics. At that point the wounds were almost circumferential however most were less than 1.5 cm. Weeping edema fluid was noted. She was referred here for evaluation. The patient has a history of chronic lower extremity edema. 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 She has had prior history with leg weeping fluid. She does not wear compression stockings. She is not doing anything specific to these wound areas. 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 She also has lymphedema. Put her in 3 layer compression and silver alginate last week. She has 3 small wounds with depth just lateral to the tibia. More problematically than this she has numerous shallow areas some of which are almost canal like in shape with tightly adherent painful debris. It would be very difficult and time- consuming to go through this and attempt to individually debride all these areas.. I changed her to collagen today to see if that would help with any of the surface debris on some of these wounds. Otherwise we will not be able to put this in compression we have to have someone change the dressing. The patient is not eligible for home health 12/25/17 on evaluation today patient actually appears to be doing rather well in regard to the ulcer on her lower extremity. Fortunately there does not appear to be evidence of infection at this time. She has been tolerating the dressing changes without complication. This includes the compression wrap. The only issue she had was that the wrap was initially placed over her bunion region which actually calls her some discomfort and pain. Other than that things seem to be going rather well. 01/01/2018 Seen today for follow-up and management of left lower extremity wound and lymphedema. T oday she presents with a new wound towards to the left lateral LE. Recently treated with a 7 day course of amoxicillin;  reason for antibiotic dose is unknown at this time. Tolerating current treatment of collagen with 4- layer wraps. She obtained a venous reflux study on 12/25/17. Studies show on the right abnormal reflux times of the popliteal vein, great saphenous vein at the saphenofemoral junction at the proximal thigh, great saphenous vein at the mid calf, and origin of the small saphenous vein.No superficial thrombosis. No deep vein thrombosis in the common femoral, femoral,and popliteal veins. Left abnormal reflex times as well of the common femoral vein, popliteal vein, and a great saphenous vein at the saphenofemoral junction, In great saphenous vein at the mid thigh w/o thrombosis. Has any issues or concerns during visit today. Recommended follow-up to vascular specialist due to abnormalities from the venous reflux study. Denies fever, pain, chills, dizziness, nausea, or vomiting. 01/08/18 upon evaluation today the patient actually seems to be showing some signs of improvement in my opinion at this point in regard to the lower extremity ulcerated areas. She still has a lot of drainage but fortunately nothing that appears to be too significant currently. I have been very happy with the overall progress I see today compared to where things were during the last evaluation that I had with her. Nonetheless she has not had her appointment with the vein specialist as of yet in fact we were able to get this approved for her today and confirmed with them she will be seeing them on December 26. Nonetheless in general I do feel like the compression wraps is doing well for her. 01/17/18; quite a bit of improvement since last time I saw this patient she has  a small open area remaining on the left lateral calf and even smaller area medially. She has lymphedema chronic stasis changes with distal skin fibrosis. She has an appointment with vascular surgery later this month 01/24/2018; the patient's medial leg has closed. Still  a small open area on the left lateral leg. She states that the 4 layer compression we put on last week was too tight and she had to take it off over a few days ago. We have had resultant increase in her lymphedema in the dorsal foot and a proximal calf. Fortunately that does not seem to have resulted in any deterioration in her wounds The patient is going to need compression stockings. We have given her measurements to phone elastic therapy in [place]. She has vascular surgery consult on December 26 02/07/18; she's had continuous contraction on the left lateral leg wound which is now very small. Currently using silver alginate under 3 layer compression She saw Dr. Trula Slade of vascular surgery on 02/06/18. It was noted that she had a normal reflux times in the popliteal vein, great saphenous vein at the saphenofemoral junction, great saphenous vein at the proximal thigh great saphenous vein at the mid calf and origin of the small saphenous vein. It was noted that she had significant reflux in the left saphenous veins with diameter measurements in the 0.7-0.8 cm range. It was felt she would benefit from laser ablation to help minimize the risk of ulcer recurrence. It was recommended that she wear 20-30 thigh-high compression stockings and have follow-up in 4-6 weeks 02/17/2018; I thought this lady would be healed however her compression slipped down and she developed increasing swelling and the wound is actually larger. 1/13; we had deterioration last week after the patient's compression slipped down and she developed periwound swelling. I increased her compression before layers we have been using silver alginate we are a lot better again today. She has her compression stockings in waiting 1/23; the patient's wounds are totally healed today. She has her stockings. This was almost circumferential skin damage. She follows up with Dr. Trula Slade of vascular surgery next Monday. READMISSION 02/21/2021 This is  a now 67 year old woman that we had in clinic here discharging in January 2020 with wounds on her left calf chronic venous insufficiency. She was discharged with 30/40 stockings. It does not sound like she has worn stockings in about a year largely from not being able to get them on herself. In November she developed new blisters on her legs an area laterally is opened into a fairly sizable wound. She has weeping posteriorly as well. She has been using Neosporin and Band-Aids. The patient did see Dr. Trula Slade in 2019 and 2020. He felt she might benefit from laser ablation in her left saphenous veins although because of the wound that healed I do not think he went through with it. She might benefit from seeing him again. Her ABI on the left is 1. 1/17; patient's wound on the posterior left calf is closed she has the 2 large areas last week. We put her in 4-layer compression for edema control is a lot better. Our intake nurse noted greenish drainage and odor. We have been using silver alginate 1/25; PCR culture I did have the substantial wound area on the left lateral lower leg showed staff aureus and group A strep. Low titers of coag negative staph which are probably skin contaminants. Resistance detected to tetracycline methicillin and macrolides. I gave her a starter kit of Nuzyra 150 mg  x 3 for 2 days then 300 mg for a further 7 days. Marked odor considerable increase in surrounding erythema. We are using Iodoflex last week however I have changed to silver alginate with underlying [name], [name] ([id number]) [id number] Page 8 of 13 2/1; she is completing her [place] tomorrow. The degree of erythema around the wounds looks a lot better. Odor has improved. I gave her silver alginate and Bactroban last week and changing her back to Iodoflex to continue with ongoing debridement 2/8; the periwound looks a lot better. Surface of the wound also looks somewhat better  although there is still ongoing debridement to be done we have been using Iodoflex under compression. Primary dressing and silver alginate 2/15; left posterior calf. Some improvement in the surface of the wound but still very gritty we have been using Iodoflex under compression. 04/05/2021: Left lateral posterior calf. She continues to have significant amounts of drainage, some of which is probably comprised of the Iodoflex microbleeds. There is no odor to the drainage. The satellite lesion on the more posterior aspect of the calf is epithelializing nicely and has contracted quite a bit. There is robust granulation tissue in the dominant wound with minimal adherent slough. 04/12/2021: The satellite lesion has nearly closed. She continues to have good granulation tissue with minimal slough of the larger primary wound. She continues to have a fair amount of drainage, but I think this is less secondary to switching her dressing from Iodoflex to Prisma. 04/19/2021: The satellite lesion is almost completely epithelialized. The larger primary wound continues to contract with good granulation tissue and minimal slough. She is currently in [place] with compression. 04/26/2021: The satellite lesion has closed. The larger primary wound has contracted further and the granulation tissue is robust without being hypertrophic. Minimal slough is present. 05/03/2021: The satellite lesion remains closed. The larger primary wound has a bit of slough, but has also contracted further with good perimeter epithelialization. Good granulation tissue at the wound surface. There was some greenish drainage on the dressing when it was removed; it is not the blue-green typically associated with Pseudomonas aeruginosa, however. 05/10/2021: The primary wound continues to contract. There is minimal slough. The granulation tissue at 9:00 is a bit hypertrophic. No significant drainage. No odor. 05/17/2021: The wound is a little bit smaller  today with good granulation tissue. Minimal slough. No significant drainage or odor. 05/24/2021: The wound continues to contract and has a nice base of granulation tissue. Small amount of slough. No concern for infection. 05/31/2021: For some reason, the wound measured slightly larger today but overall it still appears to be in good condition with a nice base of granulation tissue and minimal slough. 06/07/2021: The wound is smaller today. Good granulation tissue and minimal slough. 06/14/2021: The wound is unchanged in size. She continues to accumulate some slough. Good granulation tissue on the surface. 06/20/2021: The wound is smaller today. Minimal slough with good granulation tissue. 06/27/2021: The wound on her left lateral leg is smaller today with just a bit of slough and eschar accumulation. Unfortunately, she has opened a new superficial wound on her right lower extremity. She has 3+ pitting edema to the knees on that leg and she does not wear compression stockings. 07/04/2021: In addition to the superficial wound on her right lower extremity that she opened up last week, she has opened 3 additional sites on the same leg. Her compression wraps were clearly not in correct position when she came to clinic today as  they were at about the mid calf level rather than to the tibial tuberosity. She says that they slipped earlier and she had to come back on Friday to have them redone. The wound on her left lateral leg is perhaps slightly larger with some slough accumulation. 07/11/2021: The superficial wounds on her right lower extremity are nearly closed. They just have a thin layer of eschar overlying them. The wound on her left lateral leg is a little bit shallower and has a bit of slough accumulation. 07/17/2021: The right medial lower extremity leg wounds have almost completely closed; one of them just has a tiny opening with a little bit of serous drainage. The left lateral leg wound is really unchanged.  It seems to be stalled. 07/24/2021: The right leg wounds are completely closed. The left lateral leg wound is actually bigger today. It is a bit more tender. There is a minor accumulation of slough on the surface. 07/31/2021: The culture that I took last week was positive for MRSA. We added mupirocin under the silver alginate and I prescribed doxycycline. She has been tolerating this well. The wound is smaller today but does have slough accumulation. No significant pain or drainage. 08/07/2021: She completed her course of doxycycline. The wound looks much better today. It is smaller and the periwound is less inflamed. She does have some slough accumulation on the surface. 08/14/2021: The wound continues to contract. There is slough on the wound surface, but the periwound is intact without inflammation or induration. 08/21/2021: The wound is down to just 2 small open sites. They both have a bit of slough accumulation. 08/28/2021: The wound is down to just 1 small open site with a little bit of slough and eschar accumulation. 09/04/2021: The wound continues to contract but remains open. It is clean without any slough. 09/12/2021: No real change in the overall wound dimensions, but it is flush with the surrounding skin. There is a little bit of slough accumulation. Edema control is good. 09/22/2021: The wound is smaller and even more superficial. Minimal slough accumulation. Good control of edema. 09/29/2021: The wound continues to contract. She reports that it was a little bit "twingey" during the week. It is a little bit dry on inspection today. Light accumulation of slough. Good edema control. 10/06/2021: The wound is about the same size today. There is a little slough on the surface. Edema control is good. 10/13/2021: The wound is about the same size but more superficial. A little bit of slough on the surface. 10/23/2021: The wound is slightly smaller and continues to fill in. Minimal slough on the wound  surface. 10/30/2021: The wound continues to contract but has not yet closed. Light slough and eschar present. 11/06/2021: The wound persists, but seems a little bit more epithelialized. There is still slough and eschar accumulation. 11/14/2021: The wound continues to contract but persists. Light eschar around the wound. 11/22/2021: The wound is down to just a pinhole opening. There is eschar overlying the surface. Edema control is excellent. [name], [name] ([id number]) [id number] Page 9 of 13 11/29/2021: Her wound is closed. READMISSION 01/22/2022 This is a patient to be discharged in October. She has severe chronic venous insufficiency at that time she had a wound on her left lower leg posteriorly also the right leg at some point. These closed. We discharged her in 30/40 mm stockings from elastic therapy which she has been wearing religiously. She tells [name] that she was traumatized by a dolly while shopping at [employer]  about 2 to 3 weeks ago. She has been left with a left anterior lower leg wound. She comes in in another pair of stockings other than her 3040s. She does not have an arterial issue with her last ABI in the left at 1.2 12/18; this is a patient who has chronic venous insufficiency and recurrent venous insufficiency ulcers. She has a area on her left anterior lower leg and we readmitted her to the clinic last week. We use silver alginate and 4-layer compression. ABI was 1.2 She brought in her stocking which was a 20/30 below-knee stocking from elastic therapy. She may require a 30/40 mm equivalent stockings after this wound heals. 02/06/2022: The intake nurse reported an odor coming from the wound when she first unwrapped it but this abated after the leg was washed. There is thick layer of slough on the surface. 02/13/2022: The wound is cleaner today. It measured larger, but on visual inspection, I think some crust of Iodoflex was included in the wound  measurements; the actual wound itself looks about the same to slightly smaller. Edema control is good. 02/20/2022: The wound is cleaner again today. It is also measuring a little bit smaller, but there has been some moisture related periwound breakdown. Edema control is good. 02/27/2022: The intake nurse reported that the wound measures larger, but some of this ended up being crusted on Iodoflex. There is still some periwound moisture. Still with thick slough accumulation. Edema control is good. 03/06/2022: The wound is cleaner and more superficial, but did measure larger because of the inclusion of a satellite area. Still with some slough accumulation but less so than on prior visits. 03/13/2022: The wound measured a little bit smaller today. There is slough and eschar accumulation, as per usual. 03/20/2022: Her wound is larger and deeper today. The entire surface appears nonviable. There is more periwound erythema and threatened tissue breakdown. 03/27/2022: The culture that I took last week was positive for MRSA. We had empirically applied topical mupirocin to her wound. Today the wound is smaller and cleaner and less painful. There is still a layer of slough on the surface. Patient History Information obtained from Patient. Family History Heart Disease - Mother,Father, Hypertension - Mother,Father, Kidney Disease - Mother, Thyroid Problems - Mother, No family history of Cancer, Diabetes, Hereditary Spherocytosis, Lung Disease, Seizures, Stroke, Tuberculosis. Social History Never smoker, Marital Status - Single, Alcohol Use - Never, Drug Use - No History, Caffeine Use - Daily - coffee. Medical History Eyes Patient has history of Cataracts Hematologic/Lymphatic Patient has history of Lymphedema Cardiovascular Patient has history of Hypertension, Peripheral Venous Disease Integumentary (Skin) Denies history of History of Burn Musculoskeletal Patient has history of  Osteoarthritis Hospitalization/Surgery History - colostomy reversal. 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 Medical A Surgical History Notes nd Constitutional Symptoms (General Health) morbid obesity Cardiovascular schamberg disease, hyperlipidemia Gastrointestinal diverticulitis , h/o obstruction due to diverticulitis , colostomy and colostomy reversal Endocrine hypothyroidism Objective Constitutional Slightly hypertensive. no acute distress. Vitals Time Taken: 9:40 AM, Height: 62 in, Weight: 222 lbs, BMI: 40.6, Temperature: 98.2 F, Pulse: 81 bpm, Respiratory Rate: 18 breaths/min, Blood Pressure: 148/80 mmHg. [name], [name] ([id number]) [id number] Page 10 of 13 Respiratory Normal work of breathing on room air. General Notes: 03/27/2022: The wound is smaller and cleaner and less painful. There is still a layer of slough on the surface. Integumentary (Hair, Skin) Wound #7 status is Open. Original cause of wound was Blister. The date acquired was: 01/22/2022. The wound has been in  treatment 9 weeks. The wound is located on the Left,Anterior Lower Leg. The wound measures 1.2cm length x 1.9cm width x 0.2cm depth; 1.791cm^2 area and 0.358cm^3 volume. There is Fat Layer (Subcutaneous Tissue) exposed. There is no tunneling or undermining noted. There is a medium amount of serosanguineous drainage noted. The wound margin is distinct with the outline attached to the wound base. There is small (1-33%) pink granulation within the wound bed. There is a large (67-100%) amount of necrotic tissue within the wound bed including Eschar and Adherent Slough. The periwound skin appearance had no abnormalities noted for texture. The periwound skin appearance exhibited: Maceration, Hemosiderin Staining, Erythema. The periwound skin appearance did not exhibit: Dry/Scaly. The surrounding wound skin color is noted with erythema which is circumferential. Periwound temperature was noted  as No Abnormality. 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 There was a Selective/Open Wound Non-Viable Tissue Debridement with a total area of 2.28 sq cm performed by [name], MD. With the following instrument(s): Curette to remove Non-Viable tissue/material. Material removed includes [facility] after achieving pain control using Lidocaine 5% topical ointment. No specimens were taken. A time out was conducted at 10:04, prior to the start of the procedure. A Minimum amount of bleeding was controlled with Pressure. The procedure was tolerated well. Post Debridement Measurements: 1.2cm length x 1.9cm width x 0.2cm depth; 0.358cm^3 volume. Character of Wound/Ulcer Post Debridement requires further debridement. Post procedure Diagnosis Wound #7: Same as Pre-Procedure General Notes: Scribed for Dr. Celine Ahr by Blanche East, RN. Plan Follow-up Appointments: Return Appointment in 1 week. - Dr. Celine Ahr Room 3 Anesthetic: (In clinic) Topical Lidocaine 5% applied to wound bed Bathing/ Shower/ Hygiene: May shower with protection but do not get wound dressing(s) wet. Protect dressing(s) with water repellant cover (for example, large plastic bag) or a cast cover and may then take shower. Edema Control - Lymphedema / SCD / Other: Avoid standing for long periods of time. Patient to wear own compression stockings every day. Exercise regularly WOUND #7: - Lower Leg Wound Laterality: Left, Anterior Cleanser: Soap and Water 1 x Per Week/30 Days Discharge Instructions: May shower and wash wound with dial antibacterial soap and water prior to dressing change. Cleanser: Wound Cleanser 1 x Per Week/30 Days Discharge Instructions:  Cleanse the wound with wound cleanser prior to applying a clean dressing using gauze sponges, not tissue or cotton balls. Peri-Wound Care: Zinc Oxide Ointment 30g tube 1 x Per Week/30 Days Discharge Instructions: Apply Zinc Oxide to periwound with each dressing change Peri-Wound Care: Sween Lotion (Moisturizing lotion) 1 x Per Week/30 Days Discharge Instructions: Apply moisturizing lotion as directed Topical: Mupirocin Ointment 1 x Per Week/30 Days Discharge Instructions: Apply Mupirocin (Bactroban) as instructed Prim Dressing: Sorbalgon AG Dressing, 4x4 (in/in) 1 x Per Week/30 Days ary Discharge Instructions: Apply to wound bed as instructed Secondary Dressing: Woven Gauze Sponge, Non-Sterile 4x4 in 1 x Per Week/30 Days Discharge Instructions: Apply over primary dressing as directed. Secondary Dressing: Zetuvit Plus 4x8 in 1 x Per Week/30 Days Discharge Instructions: Apply over primary dressing as directed. Secured With: Transpore Surgical T ape, 2x10 (in/yd) 1 x Per Week/30 Days Discharge Instructions: Secure dressing with tape as directed. Com pression Wrap: ThreePress (3 layer compression wrap) 1 x Per Week/30 Days Discharge Instructions:  Apply three layer compression as directed. Com pression Wrap: Netting #5 1 x Per Week/30 Days 03/27/2022: The wound is smaller and cleaner and less painful. There is still a layer of slough on the surface. [name], [name] ([id number]) [id number] Page 11 of 13 I used a curette to debride the slough from the wound. We will continue to use the topical mupirocin with silver alginate and 4-layer compression. Follow-up in 1 week. Electronic Signature(s) Signed: 03/28/2022 8:07:13 AM By: [name] MD FACS Previous Signature: 03/27/2022 10:15:31 AM Version By: [name] MD FACS Entered By: [name] on 03/28/2022 08:07:12 -------------------------------------------------------------------------------- HxROS  Details Patient Name: Date of Service: [name] 03/27/2022 9:30 A M Medical Record Number: [id number] Patient Account Number: [id number] Date of Birth/Sex: Treating RN: 1955/05/19 (67 y.o. F) Primary Care Provider: [name] Other Clinician: Referring Provider: Treating Provider/Extender: [name] in Treatment: 9 Information Obtained From Patient Constitutional Symptoms (General Health) Medical History: Past Medical History Notes: morbid obesity Eyes Medical History: Positive for: Cataracts Hematologic/Lymphatic Medical History: Positive for: Lymphedema Cardiovascular Medical History: Positive for: Hypertension; Peripheral Venous Disease Past Medical History Notes: schamberg disease, hyperlipidemia Gastrointestinal Medical History: Past Medical History Notes: diverticulitis , h/o obstruction due to diverticulitis , colostomy and colostomy reversal Endocrine Medical History: Past Medical History Notes: hypothyroidism Integumentary (Skin) Medical History: Negative for: History of Burn Musculoskeletal Medical History: Positive for: Osteoarthritis HBO Extended History Items EyesMATEYA, FELKER ([id number]) [id number] Page 12 of 13 Cataracts Immunizations Pneumococcal Vaccine: Received Pneumococcal Vaccination: Yes Received Pneumococcal Vaccination On or After 60th Birthday: Yes Implantable Devices None Hospitalization / Surgery History Type of Hospitalization/Surgery colostomy reversal 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 Family and Social History Cancer: No; Diabetes: No; Heart Disease: Yes - Mother,Father; Hereditary Spherocytosis: No; Hypertension: Yes - Mother,Father; Kidney Disease: Yes - Mother; Lung Disease: No; Seizures: No; Stroke: No; Thyroid Problems: Yes - Mother; Tuberculosis: No; Never smoker; Marital Status - Single; Alcohol Use: Never; Drug Use: No History; Caffeine Use: Daily -  coffee; Financial Concerns: No; Food, Clothing or Shelter Needs: No; Support System Lacking: No; Transportation Concerns: No Electronic Signature(s) Signed: 03/27/2022 12:27:44 PM By: [name] MD FACS Entered By: [name] on 03/27/2022 10:08:49 -------------------------------------------------------------------------------- SuperBill Details Patient Name: Date of Service: [name] 03/27/2022 Medical Record Number: [id number] Patient Account Number: [id number] Date of Birth/Sex: Treating RN: 04-09-1955 (67 y.o. 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 with other specif Modifier: ied severity Quantity: 1 Physician Procedures : CPT4 Code Description Modifier [id number] - WC PHYS LEVEL 4 - EST PT 25 ICD-10 Diagnosis Description L97.928 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

## 2022-03-28 NOTE — Progress Notes (Signed)
[name] ([id number]) [id number] Page 1 of 8 Visit Report for 03/27/2022 Arrival Information Details Patient Name: Date of Service: [name], [name] 03/27/2022 9:30 A M Medical Record Number: [id number] Patient Account Number: [id number] Date of Birth/Sex: Treating RN: 1956/01/09 (67 y.o. Iver Nestle, [place] Primary Care Daiel Strohecker: [name] Other Clinician: Referring Jaleen Finch: Treating Alleen Kehm/Extender: [name] in Treatment: 9 Visit Information History Since Last Visit Added or deleted any medications: No Patient Arrived: [name] Any new allergies or adverse reactions: No Arrival Time: 09:38 Had a fall or experienced change in No Accompanied By: self activities of daily living that may affect Transfer Assistance: None risk of falls: Patient Identification Verified: Yes Signs or symptoms of abuse/neglect since last visito No Secondary Verification Process Completed: Yes Hospitalized since last visit: No Patient Requires Transmission-Based Precautions: No Implantable device outside of the clinic excluding No Patient Has Alerts: No cellular tissue based products placed in the center since last visit: Has Compression in Place as Prescribed: Yes Pain Present Now: Yes Electronic Signature(s) Signed: 03/27/2022 4:10:46 PM By: Blanche East RN Entered By: Blanche East on 03/27/2022 09:50:00 -------------------------------------------------------------------------------- Encounter Discharge Information Details Patient Name: Date of Service: Erin Face [name] 03/27/2022 9:30 A M Medical Record Number: [id number] Patient Account Number: [id number] Date of Birth/Sex: Treating RN: [birth year] (67 y.o. [name] Primary Care Kailen Name: [name] Other Clinician: Referring Mylon Mabey: Treating Ashlynne Shetterly/Extender: [name] in Treatment: 9 Encounter Discharge Information Items Post  Procedure Vitals Discharge Condition: Stable Temperature (F): 98.2 Ambulatory Status: Cane Pulse (bpm): 82 Discharge Destination: Home Respiratory Rate (breaths/min): 18 Transportation: Private Auto Blood Pressure (mmHg): 148/80 Accompanied By: self Schedule Follow-up Appointment: Yes Clinical Summary of Care: Electronic Signature(s) Signed: 03/27/2022 4:10:46 PM By: Blanche East RN Entered By: Blanche East on 03/27/2022 10:22:02 [name] ([id number].pdf Page 2 of 8 -------------------------------------------------------------------------------- Lower Extremity Assessment Details Patient Name: Date of Service: [name], [name] 03/27/2022 9:30 A M Medical Record Number: [id number] Patient Account Number: [id number] Date of Birth/Sex: Treating RN: [birth year] (67 y.o. [name] Primary Care Wister Hoefle: [name] Other Clinician: Referring Johney Perotti: Treating Josalyn Dettmann/Extender: [name] in Treatment: 9 Edema Assessment Assessed: [Left: No] [Right: No] [Left: Edema] [Right: :] Calf Left: Right: Point of Measurement: From Medial Instep 38.5 cm Ankle Left: Right: Point of Measurement: From Medial Instep 20.4 cm Vascular Assessment Pulses: Dorsalis Pedis Palpable: [Left:Yes] Electronic Signature(s) Signed: 03/27/2022 4:10:46 PM By: Blanche East RN Entered By: Blanche East on 03/27/2022 09:50:49 -------------------------------------------------------------------------------- Multi Wound Chart Details Patient Name: Date of Service: Erin Face [name] 03/27/2022 9:30 A M Medical Record Number: [id number] Patient Account Number: [id number] Date of Birth/Sex: Treating RN: 1955/09/01 (67 y.o. F) Primary Care [name]: [name] Other Clinician: Referring Vegas Fritze: Treating Roth Ress/Extender: [name] in Treatment: 9 Vital Signs Height(in): 62 Pulse(bpm):  81 Weight(lbs): 222 Blood Pressure(mmHg): 148/80 Body Mass Index(BMI): 40.6 Temperature(F): 98.2 Respiratory Rate(breaths/min): 18 [7:Photos:] [N/A:N/A] Left, Anterior Lower Leg N/A N/A Wound Location: Blister N/A N/A Wounding Event: Lymphedema N/A N/A Primary Etiology: Cataracts, Lymphedema, N/A N/A Comorbid History: Hypertension, Peripheral Venous Disease, Osteoarthritis 01/22/2022 N/A N/A Date Acquired: 9 N/A N/A Weeks of Treatment: Open N/A N/A Wound Status: No N/A N/A Wound Recurrence: 1.2x1.9x0.2 N/A N/A Measurements L x W x [name] 1.791 N/A N/A A (cm) : rea 0.358 N/A N/A Volume (cm) : 42.40% N/A N/A % Reduction in Area: 42.40% N/A N/A % Reduction in Volume: Full  Thickness Without Exposed N/A N/A Classification: Support Structures Medium N/A N/A Exudate A mount: Serosanguineous N/A N/A Exudate Type: red, brown N/A N/A Exudate Color: Distinct, outline attached N/A N/A Wound Margin: Small (1-33%) N/A N/A Granulation Amount: Pink N/A N/A Granulation Quality: Large (67-100%) N/A N/A Necrotic Amount: Eschar, Adherent Slough N/A N/A Necrotic Tissue: Fat Layer (Subcutaneous Tissue): Yes N/A N/A Exposed Structures: Fascia: No Tendon: No Muscle: No Joint: No Bone: No Small (1-33%) N/A N/A Epithelialization: No Abnormalities Noted N/A N/A Periwound Skin Texture: Maceration: Yes N/A N/A Periwound Skin Moisture: Dry/Scaly: No Erythema: Yes N/A N/A Periwound Skin Color: Hemosiderin Staining: Yes Circumferential N/A N/A Erythema Location: No Abnormality N/A N/A Temperature: Treatment Notes Wound #7 (Lower Leg) Wound Laterality: Left, Anterior Cleanser Soap and Water Discharge Instruction: May shower and wash wound with dial antibacterial soap and water prior to dressing change. Wound Cleanser Discharge Instruction: Cleanse the wound with wound cleanser prior to applying a clean dressing using gauze sponges, not tissue or cotton  balls. Peri-Wound Care Zinc Oxide Ointment 30g tube Discharge Instruction: Apply Zinc Oxide to periwound with each dressing change Sween Lotion (Moisturizing lotion) Discharge Instruction: Apply moisturizing lotion as directed Topical Mupirocin Ointment Discharge Instruction: Apply Mupirocin (Bactroban) as instructed Primary Dressing Sorbalgon AG Dressing, 4x4 (in/in) Discharge Instruction: Apply to wound bed as instructed Secondary Dressing Woven Gauze Sponge, Non-Sterile 4x4 in Discharge Instruction: Apply over primary dressing as directed. Zetuvit Plus 4x8 in Discharge Instruction: Apply over primary dressing as directed. Secured With Transpore Surgical Tape, 2x10 (in/yd) Discharge Instruction: Secure dressing with tape as directed. Compression Wrap ThreePress (3 layer compression wrap) Discharge Instruction: Apply three layer compression as directed. ALSHA, CHMELIK ([id number]) [id number] Page 4 of 8 Netting #5 Compression Stockings Add-Ons Electronic Signature(s) Signed: 03/27/2022 10:08:01 AM By: [name] MD FACS Entered By: [name] on 03/27/2022 10:08:01 -------------------------------------------------------------------------------- Multi-Disciplinary Care Plan Details Patient Name: Date of Service: Erin Face [name] 03/27/2022 9:30 A M Medical Record Number: [id number] Patient Account Number: [id number] Date of Birth/Sex: Treating RN: 16-Oct-1955 (67 y.o. [name] Primary Care Sher Shampine: [name] Other Clinician: Referring Lakeitha Basques: Treating Ariyan Brisendine/Extender: [name] in Treatment: 9 Active Inactive Abuse / Safety / Falls / Self Care Management Nursing Diagnoses: History of Falls Impaired physical mobility Goals: Patient/caregiver will identify factors that restrict self-care and home management Date Initiated: 01/22/2022 Target Resolution Date: 06/12/2022 Goal Status:  Active Interventions: Assess fall risk on admission and as needed Assess: immobility, friction, shearing, incontinence upon admission and as needed Notes: Wound/Skin Impairment Nursing Diagnoses: Impaired tissue integrity Knowledge deficit related to ulceration/compromised skin integrity Goals: Patient/caregiver will verbalize understanding of skin care regimen Date Initiated: 01/22/2022 Target Resolution Date: 06/12/2022 Goal Status: Active Interventions: Assess ulceration(s) every visit Treatment Activities: Skin care regimen initiated : 01/22/2022 Topical wound management initiated : 01/22/2022 Notes: Electronic Signature(s) Signed: 03/27/2022 10:00:39 AM By: Blanche East RN Entered By: Blanche East on 03/27/2022 10:00:39 Derrill Memo [name] ([id number]) [phone].pdf Page 5 of 8 -------------------------------------------------------------------------------- Pain Assessment Details Patient Name: Date of Service: [name], [name] 03/27/2022 9:30 A M Medical Record Number: [id number] Patient Account Number: [id number] Date of Birth/Sex: Treating RN: 16-Dec-1955 (67 y.o. [name] Primary Care Bea Duren: [name] Other Clinician: Referring Barnabas Henriques: Treating Anabell Swint/Extender: [name] in Treatment: 9 Active Problems Location of Pain Severity and Description of Pain Patient Has Paino Yes Site Locations Pain Location: Pain in Ulcers Rate the pain. Current Pain Level: 1 Pain Management and Medication  Current Pain Management: Electronic Signature(s) Signed: 03/27/2022 4:10:46 PM By: Blanche East RN Entered By: Blanche East on 03/27/2022 09:50:36 -------------------------------------------------------------------------------- Patient/Caregiver Education Details Patient Name: Date of Service: [name] 2/13/2024andnbsp9:30 [place] Record Number: [id number] Patient Account Number:  [id number] Date of Birth/Gender: Treating RN: 02-25-1955 (67 y.o. [name] Primary Care Physician: [name] Other Clinician: Referring Physician: Treating Physician/Extender: [name] in Treatment: 9 Education Assessment Education Provided To: Patient Education Topics Provided Wound Debridement: Methods: Explain/Verbal Responses: Reinforcements needed, State content correctly TIMYA, QUARTARARO [name] ([id number]) [id number].pdf Page 6 of 8 Wound/Skin Impairment: Methods: Explain/Verbal Responses: Reinforcements needed, State content correctly Electronic Signature(s) Signed: 03/27/2022 4:10:46 PM By: Blanche East RN Entered By: Blanche East on 03/27/2022 10:00:59 -------------------------------------------------------------------------------- Wound Assessment Details Patient Name: Date of Service: ETOSHA, HRBEK [name] 03/27/2022 9:30 A M Medical Record Number: [id number] Patient Account Number: [id number] Date of Birth/Sex: Treating RN: 1955-05-19 (67 y.o. Iver Nestle, [name] Primary Care Lendy Dittrich: [name] Other Clinician: Referring Jayan Raymundo: Treating Leticia Coletta/Extender: [name] in Treatment: 9 Wound Status Wound Number: 7 Primary Lymphedema Etiology: Wound Location: Left, Anterior Lower Leg Wound Open Wounding Event: Blister Status: Date Acquired: 01/22/2022 Comorbid Cataracts, Lymphedema, Hypertension, Peripheral Venous Weeks Of Treatment: 9 History: Disease, Osteoarthritis Clustered Wound: No Photos Wound Measurements Length: (cm) 1.2 Width: (cm) 1.9 Depth: (cm) 0.2 Area: (cm) 1.791 Volume: (cm) 0.358 % Reduction in Area: 42.4% % Reduction in Volume: 42.4% Epithelialization: Small (1-33%) Tunneling: No Undermining: No Wound Description Classification: Full Thickness Without Exposed Suppor Wound Margin: Distinct, outline attached Exudate Amount: Medium Exudate Type:  Serosanguineous Exudate Color: red, brown t Structures Foul Odor After Cleansing: No Slough/Fibrino Yes Wound Bed Granulation Amount: Small (1-33%) Exposed Structure Granulation Quality: Pink Fascia Exposed: No Necrotic Amount: Large (67-100%) Fat Layer (Subcutaneous Tissue) Exposed: Yes Necrotic Quality: Eschar, Adherent Slough Tendon Exposed: No Muscle Exposed: No Joint Exposed: No Bone Exposed: No [address] [name], [name] ([id number]) [id number] Page 7 of 8 No Abnormalities Noted: Yes No Abnormalities Noted: No Erythema: Yes Moisture Erythema Location: Circumferential No Abnormalities Noted: No Hemosiderin Staining: Yes Dry / Scaly: No Maceration: Yes Temperature / Pain Temperature: No Abnormality Treatment Notes Wound #7 (Lower Leg) Wound Laterality: Left, Anterior Cleanser Soap and Water Discharge Instruction: May shower and wash wound with dial antibacterial soap and water prior to dressing change. Wound Cleanser Discharge Instruction: Cleanse the wound with wound cleanser prior to applying a clean dressing using gauze sponges, not tissue or cotton balls. Peri-Wound Care Zinc Oxide Ointment 30g tube Discharge Instruction: Apply Zinc Oxide to periwound with each dressing change Sween Lotion (Moisturizing lotion) Discharge Instruction: Apply moisturizing lotion as directed Topical Mupirocin Ointment Discharge Instruction: Apply Mupirocin (Bactroban) as instructed Primary Dressing Sorbalgon AG Dressing, 4x4 (in/in) Discharge Instruction: Apply to wound bed as instructed Secondary Dressing Woven Gauze Sponge, Non-Sterile 4x4 in Discharge Instruction: Apply over primary dressing as directed. Zetuvit Plus 4x8 in Discharge Instruction: Apply over primary dressing as directed. Secured With Transpore Surgical Tape, 2x10 (in/yd) Discharge Instruction: Secure dressing with tape as directed. Compression  Wrap ThreePress (3 layer compression wrap) Discharge Instruction: Apply three layer compression as directed. Netting #5 Compression Stockings Add-Ons Electronic Signature(s) Signed: 03/27/2022 4:10:46 PM By: Blanche East RN Entered By: Blanche East on 03/27/2022 09:53:02 -------------------------------------------------------------------------------- Vitals Details Patient Name: Date of Service: Erin Gerold NITA [name] 03/27/2022 9:30 A M Medical Record Number: [id number] Patient Account Number: [id number] Date of Birth/Sex: Treating RN: September 01, 1955 (  67 y.o. [name] Primary Care Selin Eisler: [name] Other Clinician: Referring Dawne Casali: Treating Chyler Creely/Extender: [name] in Treatment: [address] [name], [name] ([id number]) [id number] Page 8 of 8 Time Taken: 09:40 Temperature (F): 98.2 Height (in): 62 Pulse (bpm): 81 Weight (lbs): 222 Respiratory Rate (breaths/min): 18 Body Mass Index (BMI): 40.6 Blood Pressure (mmHg): 148/80 Reference Range: 80 - 120 mg / dl Electronic Signature(s) Signed: 03/27/2022 4:10:46 PM By: Blanche East RN Entered By: Blanche East on 03/27/2022 09:50:22

## 2022-04-03 ENCOUNTER — Encounter (HOSPITAL_BASED_OUTPATIENT_CLINIC_OR_DEPARTMENT_OTHER): Payer: Commercial Managed Care - PPO | Admitting: General Surgery

## 2022-04-03 DIAGNOSIS — L97928 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: Secondary | ICD-10-CM | POA: Diagnosis not present

## 2022-04-03 DIAGNOSIS — I890 Lymphedema, not elsewhere classified: Secondary | ICD-10-CM | POA: Diagnosis not present

## 2022-04-03 DIAGNOSIS — E785 Hyperlipidemia, unspecified: Secondary | ICD-10-CM | POA: Diagnosis not present

## 2022-04-03 DIAGNOSIS — E039 Hypothyroidism, unspecified: Secondary | ICD-10-CM | POA: Diagnosis not present

## 2022-04-03 DIAGNOSIS — I739 Peripheral vascular disease, unspecified: Secondary | ICD-10-CM | POA: Diagnosis not present

## 2022-04-03 DIAGNOSIS — I872 Venous insufficiency (chronic) (peripheral): Secondary | ICD-10-CM | POA: Diagnosis not present

## 2022-04-03 DIAGNOSIS — I10 Essential (primary) hypertension: Secondary | ICD-10-CM | POA: Diagnosis not present

## 2022-04-04 NOTE — Progress Notes (Signed)
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 Left,Anterior Lower Leg . There was a Three Layer Compression Therapy Procedure by [name], RN. Post procedure Diagnosis Wound #7: Same as Pre-Procedure Plan Follow-up Appointments: Return Appointment in  1 week. - Dr. Celine Ahr Room 3 Anesthetic: (In clinic) Topical Lidocaine 5% applied to wound bed Bathing/ Shower/ Hygiene: May shower with protection but do not get wound dressing(s) wet. Protect dressing(s) with water repellant cover (for example, large plastic bag) or a cast cover and may then take shower. - Please do not get the Left lower leg Compression wraps wet. Edema Control - Lymphedema / SCD / Other: Avoid standing for long periods of time. Patient to wear own compression stockings every day. Exercise regularly Moisturize legs daily. WOUND #7: - Lower Leg Wound Laterality: Left, Anterior Cleanser: Soap and Water 1 x Per Week/30 Days Discharge Instructions: May shower and wash wound with dial antibacterial soap and water prior to dressing change. Cleanser: Wound Cleanser 1 x Per Week/30 Days Discharge Instructions: Cleanse the wound with wound cleanser prior to applying a clean dressing using gauze sponges, not tissue or cotton balls. Peri-Wound Care: Zinc Oxide Ointment 30g  tube 1 x Per Week/30 Days Discharge Instructions: Apply Zinc Oxide to periwound with each dressing change Peri-Wound Care: Sween Lotion (Moisturizing lotion) 1 x Per Week/30 Days [name], [name] ([id number]) [phone].pdf Page 11 of 13 Discharge Instructions: Apply moisturizing lotion as directed Topical: Mupirocin Ointment 1 x Per Week/30 Days Discharge Instructions: Apply Mupirocin (Bactroban) as instructed Prim Dressing: Sorbalgon AG Dressing, 4x4 (in/in) 1 x Per Week/30 Days ary Discharge Instructions: Apply to wound bed as instructed Secondary Dressing: Woven Gauze Sponge, Non-Sterile 4x4 in 1 x Per Week/30 Days Discharge Instructions: Apply over primary dressing as directed. Secondary Dressing: Zetuvit Plus 4x8 in 1 x Per Week/30 Days Discharge Instructions: Apply over primary dressing as directed. Secured With: Transpore Surgical T ape, 2x10 (in/yd) 1 x Per Week/30  Days Discharge Instructions: Secure dressing with tape as directed. Com pression Wrap: ThreePress (3 layer compression wrap) 1 x Per Week/30 Days Discharge Instructions: Apply three layer compression as directed. Com pression Wrap: Netting #5 1 x Per Week/30 Days 04/03/2022: The wound was measured slightly larger today, but appears roughly the same to my eye. There is a layer of slough on the surface. Underneath, the tissue is quite fibrotic. I used a curette to debride slough and nonviable subcutaneous tissue, trying to roughen up the fibrotic surface to encourage the healing cascade. We will continue topical mupirocin with silver alginate and 4-layer compression. Follow-up in 1 week. Electronic Signature(s) Signed: 04/03/2022 10:26:39 AM By: [name] MD FACS Entered By: [name] on 04/03/2022 10:26:39 -------------------------------------------------------------------------------- HxROS Details Patient Name: Date of Service: [name] 04/03/2022 9:30 A M Medical Record Number: [id number] Patient Account Number: [id number] Date of Birth/Sex: Treating RN: 08-28-1955 (67 y.o. F) Primary Care Provider: [name] Other Clinician: Referring Provider: Treating Provider/Extender: [name] in Treatment: 10 Information Obtained From Patient Constitutional Symptoms (General Health) Medical History: Past Medical History Notes: morbid obesity Eyes Medical History: Positive for: Cataracts Hematologic/Lymphatic Medical History: Positive for: Lymphedema Cardiovascular Medical History: Positive for: Hypertension; Peripheral Venous Disease Past Medical History Notes: schamberg disease, hyperlipidemia Gastrointestinal Medical History: Past Medical History Notes: diverticulitis , h/o obstruction due to diverticulitis , colostomy and colostomy reversal Endocrine SHALISE, ASPLIN [name] ([id number]) [id number] Page  12 of 13 Medical History: Past Medical History Notes: hypothyroidism Integumentary (Skin) Medical History: Negative for: History of Burn Musculoskeletal Medical History: Positive for: Osteoarthritis HBO Extended History Items Eyes: Cataracts Immunizations Pneumococcal Vaccine: Received Pneumococcal Vaccination: Yes Received Pneumococcal Vaccination On or After 60th Birthday: Yes Implantable Devices None Hospitalization / Surgery History Type of Hospitalization/Surgery colostomy reversal 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 Family and Social History Cancer: No; Diabetes: No; Heart Disease: Yes - Mother,Father; Hereditary Spherocytosis: No; Hypertension: Yes - Mother,Father; Kidney Disease: Yes - Mother; Lung Disease: No; Seizures: No; Stroke: No; Thyroid Problems: Yes - Mother; Tuberculosis: No; Never smoker; Marital Status - Single; Alcohol Use: Never; Drug Use: No History; Caffeine Use: Daily - coffee; Financial Concerns: No; Food, Clothing or Shelter Needs: No; Support System Lacking: No; Transportation Concerns: No Electronic Signature(s) Signed: 04/03/2022 10:37:40 AM By: [name] MD FACS Entered By: [name] on 04/03/2022 10:22:12 -------------------------------------------------------------------------------- SuperBill Details Patient Name: Date of Service: [name] 04/03/2022 Medical Record Number: [id number] Patient Account Number: [id number] Date of Birth/Sex: Treating RN: 11-28-1955 (67 y.o. 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

## 2022-04-04 NOTE — Progress Notes (Signed)
DANAYAH, HOLDT ([id number]) [id number] Page 1 of 5 Visit Report for 04/03/2022 Arrival Information Details Patient Name: Date of Service: [name], [name] 04/03/2022 9:30 A M Medical Record Number: [id number] Patient Account Number: [id number] Date of Birth/Sex: Treating RN: Dec 01, 1955 (67 y.o. [name] Primary Care Sharaya Boruff: [name] Other Clinician: Referring Hermann Dottavio: Treating Jaidalyn Schillo/Extender: [name] in Treatment: 10 Visit Information History Since Last Visit Added or deleted any medications: No Patient Arrived: [name] Any new allergies or adverse reactions: No Arrival Time: 09:45 Had a fall or experienced change in No Accompanied By: self activities of daily living that may affect Transfer Assistance: None risk of falls: Patient Requires Transmission-Based Precautions: No Signs or symptoms of abuse/neglect since last visito No Patient Has Alerts: No Hospitalized since last visit: No Implantable device outside of the clinic excluding No cellular tissue based products placed in the center since last visit: Has Dressing in Place as Prescribed: Yes Has Compression in Place as Prescribed: Yes Pain Present Now: No Electronic Signature(s) Signed: 04/03/2022 5:21:05 PM By: [name] RN Entered By: [name] on 04/03/2022 10:09:19 -------------------------------------------------------------------------------- Compression Therapy Details Patient Name: Date of Service: Erin Face D. 04/03/2022 9:30 A M Medical Record Number: [id number] Patient Account Number: [id number] Date of Birth/Sex: Treating RN: Apr 27, 1955 (67 y.o. [name] Primary Care Aritza Brunet: [name] Other Clinician: Referring Lindsay Soulliere: Treating Mally Gavina/Extender: [name] in Treatment: 10 Compression Therapy Performed for Wound Assessment: Wound #7 Left,Anterior Lower  Leg Performed By: Clinician [name], RN Compression Type: Three Layer Post Procedure Diagnosis Same as Pre-procedure Electronic Signature(s) Signed: 04/03/2022 5:21:05 PM By: [name] RN Entered By: [name] on 04/03/2022 10:14:32 BELLANIE, ASSMANN D ([id number] Page 2 of 5 -------------------------------------------------------------------------------- Lower Extremity Assessment Details Patient Name: Date of Service: [name], [name] 04/03/2022 9:30 A M Medical Record Number: [id number] Patient Account Number: [id number] Date of Birth/Sex: Treating RN: May 11, 1955 (67 y.o. [name] Primary Care [name]: [name] Other Clinician: Referring Zylpha Poynor: Treating Malikah Principato/Extender: [name] in Treatment: 10 Edema Assessment Assessed: [Left: No] [Right: No] [Left: Edema] [Right: :] Calf Left: Right: Point of Measurement: From Medial Instep 38.5 cm Ankle Left: Right: Point of Measurement: From Medial Instep 22 cm Vascular Assessment Pulses: Dorsalis Pedis Palpable: [Left:Yes] Electronic Signature(s) Signed: 04/03/2022 5:21:05 PM By: [name] RN Entered By: [name] on 04/03/2022 10:10:22 -------------------------------------------------------------------------------- Multi Wound Chart Details Patient Name: Date of Service: Erin Face D. 04/03/2022 9:30 A M Medical Record Number: [id number] Patient Account Number: [id number] Date of Birth/Sex: Treating RN: April 29, 1955 (67 y.o. F) Primary Care Johannes Everage: [name] Other Clinician: Referring Noma Quijas: Treating Sally-Ann Cutbirth/Extender: [name] in Treatment: 10 Vital Signs Height(in): 62 Pulse(bpm): 76 Weight(lbs): 222 Blood Pressure(mmHg): 119/72 Body Mass Index(BMI): 40.6 Temperature(F): 97.7 Respiratory Rate(breaths/min): 18 [7:Photos:] [N/A:N/A] Left, Anterior Lower Leg  N/A N/A Wound Location: Blister N/A N/A Wounding Event: Lymphedema N/A N/A Primary Etiology: Cataracts, Lymphedema, N/A N/A Comorbid History: Hypertension, Peripheral Venous Erin, [name] ([id number] Page 3 of 5 Disease, Osteoarthritis 01/22/2022 N/A N/A Date Acquired: 10 N/A N/A Weeks of Treatment: Open N/A N/A Wound Status: No N/A N/A Wound Recurrence: 1.7x1.9x0.2 N/A N/A Measurements L x W x D (cm) 2.537 N/A N/A A (cm) : rea 0.507 N/A N/A Volume (cm) : 18.40% N/A N/A % Reduction in Area: 18.50% N/A N/A % Reduction in Volume: Full Thickness Without Exposed N/A N/A Classification: Support Structures Medium N/A N/A  Exudate A mount: Serosanguineous N/A N/A Exudate Type: red, [name] N/A N/A Exudate Color: Distinct, outline attached N/A N/A Wound Margin: Small (1-33%) N/A N/A Granulation A mount: Pink N/A N/A Granulation Quality: Large (67-100%) N/A N/A Necrotic A mount: Eschar, Adherent Slough N/A N/A Necrotic Tissue: Fat Layer (Subcutaneous Tissue): Yes N/A N/A Exposed Structures: Fascia: No Tendon: No Muscle: No Joint: No Bone: No Small (1-33%) N/A N/A Epithelialization: Debridement - Excisional N/A N/A Debridement: Pre-procedure Verification/Time Out 10:09 N/A N/A Taken: Lidocaine 5% topical ointment N/A N/A Pain Control: Subcutaneous, Slough N/A N/A Tissue Debrided: Skin/Subcutaneous Tissue N/A N/A Level: 3.23 N/A N/A Debridement A (sq cm): rea Curette N/A N/A Instrument: Minimum N/A N/A Bleeding: Pressure N/A N/A Hemostasis A chieved: 0 N/A N/A Procedural Pain: 0 N/A N/A Post Procedural Pain: Procedure was tolerated well N/A N/A Debridement Treatment Response: 1.7x1.9x0.2 N/A N/A Post Debridement Measurements L x W x D (cm) 0.507 N/A N/A Post Debridement Volume: (cm) No Abnormalities Noted N/A N/A Periwound Skin Texture: Maceration: Yes N/A N/A Periwound Skin Moisture: Dry/Scaly:  No Erythema: Yes N/A N/A Periwound Skin Color: Hemosiderin Staining: Yes Circumferential N/A N/A Erythema Location: No Abnormality N/A N/A Temperature: Compression Therapy N/A N/A Procedures Performed: Debridement Treatment Notes Electronic Signature(s) Signed: 04/03/2022 10:21:14 AM By: [name] MD FACS Entered By: [name] on 04/03/2022 10:21:13 -------------------------------------------------------------------------------- Pain Assessment Details Patient Name: Date of Service: Erin Face D. 04/03/2022 9:30 A M Medical Record Number: [id number] Patient Account Number: [id number] Date of Birth/Sex: Treating RN: [birth year] (67 y.o. [name] Primary Care Jadin Creque: [name] Other Clinician: Referring Hamad Whyte: Treating Reyanna Baley/Extender: [name] in Treatment: 10 Active Problems Location of Pain Severity and Description of Pain Patient Has Paino No [name], [name] ([id number]) [phone].pdf Page 4 of 5 Patient Has Paino No Site Locations Pain Management and Medication Current Pain Management: Electronic Signature(s) Signed: 04/03/2022 5:21:05 PM By: [name] RN Entered By: [name] on 04/03/2022 10:10:05 -------------------------------------------------------------------------------- Wound Assessment Details Patient Name: Date of Service: Erin Face D. 04/03/2022 9:30 A M Medical Record Number: [id number] Patient Account Number: [id number] Date of Birth/Sex: Treating RN: 11/23/1955 (67 y.o. [name] Primary Care Dawnya Grams: [name] Other Clinician: Referring Marica Trentham: Treating Kartier Bennison/Extender: [name] in Treatment: 10 Wound Status Wound Number: 7 Primary Lymphedema Etiology: Wound Location: Left, Anterior Lower Leg Wound Open Wounding Event: Blister Status: Date Acquired: 01/22/2022 Comorbid Cataracts,  Lymphedema, Hypertension, Peripheral Venous Weeks Of Treatment: 10 History: Disease, Osteoarthritis Clustered Wound: No Photos Wound Measurements Length: (cm) 1.7 Width: (cm) 1.9 Depth: (cm) 0.2 Area: (cm) 2.537 Volume: (cm) 0.507 Walter, Crestina D ([id number]) Wound Description Classification: Full Thickness Without Exposed Support Structures Wound Margin: Distinct, outline attached Exudate Amount: Medium Exudate Type: Serosanguineous Exudate Color: red, [name] Foul Odor After Cleansing: No Slough/Fibrino Yes % Reduction in Area: 18.4% % Reduction in Volume: 18.5% Epithelialization: Small (1-33%) Tunneling: No Undermining: No [id number] Page 5 of 5 Wound Bed Granulation Amount: Small (1-33%) Exposed Structure Granulation Quality: Pink Fascia Exposed: No Necrotic Amount: Large (67-100%) Fat Layer (Subcutaneous Tissue) Exposed: Yes Necrotic Quality: Eschar, Adherent Slough Tendon Exposed: No Muscle Exposed: No Joint Exposed: No Bone Exposed: No Periwound Skin Texture Texture Color No Abnormalities Noted: Yes No Abnormalities Noted: No Erythema: Yes Moisture Erythema Location: Circumferential No Abnormalities Noted: No Hemosiderin Staining: Yes Dry / Scaly: No Maceration: Yes Temperature / Pain Temperature: No Abnormality Electronic Signature(s) Signed: 04/03/2022 5:21:05 PM By: [name] RN Entered By: [name]  on 04/03/2022 10:03:14 -------------------------------------------------------------------------------- Vitals Details Patient Name: Date of Service: ENGA, [name] 04/03/2022 9:30 A M Medical Record Number: [id number] Patient Account Number: [id number] Date of Birth/Sex: Treating RN: 11-09-1955 (67 y.o. [name] Primary Care Elisabetta Mishra: [name] Other Clinician: Referring Rhyan Wolters: Treating Randee Upchurch/Extender: [name] in Treatment: 10 Vital Signs Time Taken:  10:02 Temperature (F): 97.7 Height (in): 62 Pulse (bpm): 76 Weight (lbs): 222 Respiratory Rate (breaths/min): 18 Body Mass Index (BMI): 40.6 Blood Pressure (mmHg): 119/72 Reference Range: 80 - 120 mg / dl Electronic Signature(s) Signed: 04/03/2022 5:21:05 PM By: [name] RN Entered By: [name] on 04/03/2022 10:09:57

## 2022-04-07 ENCOUNTER — Ambulatory Visit (HOSPITAL_BASED_OUTPATIENT_CLINIC_OR_DEPARTMENT_OTHER): Payer: Commercial Managed Care - PPO | Admitting: General Surgery

## 2022-04-10 ENCOUNTER — Encounter (HOSPITAL_BASED_OUTPATIENT_CLINIC_OR_DEPARTMENT_OTHER): Payer: Commercial Managed Care - PPO | Admitting: General Surgery

## 2022-04-10 DIAGNOSIS — L97928 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: Secondary | ICD-10-CM | POA: Diagnosis not present

## 2022-04-10 DIAGNOSIS — E039 Hypothyroidism, unspecified: Secondary | ICD-10-CM | POA: Diagnosis not present

## 2022-04-10 DIAGNOSIS — I872 Venous insufficiency (chronic) (peripheral): Secondary | ICD-10-CM | POA: Diagnosis not present

## 2022-04-10 DIAGNOSIS — I890 Lymphedema, not elsewhere classified: Secondary | ICD-10-CM | POA: Diagnosis not present

## 2022-04-10 DIAGNOSIS — I739 Peripheral vascular disease, unspecified: Secondary | ICD-10-CM | POA: Diagnosis not present

## 2022-04-10 DIAGNOSIS — I10 Essential (primary) hypertension: Secondary | ICD-10-CM | POA: Diagnosis not present

## 2022-04-10 DIAGNOSIS — E785 Hyperlipidemia, unspecified: Secondary | ICD-10-CM | POA: Diagnosis not present

## 2022-04-11 ENCOUNTER — Encounter: Payer: Self-pay | Admitting: Internal Medicine

## 2022-04-12 NOTE — Progress Notes (Signed)
ANEZKA, STILTNER ([id number]) [id number] Page 1 of 7 Visit Report for 04/10/2022 Arrival Information Details Patient Name: Date of Service: [name], [name] 04/10/2022 9:30 A M Medical Record Number: [id number] Patient Account Number: [id number] Date of Birth/Sex: Treating RN: 12/20/1955 (67 y.o. [name] Primary Care Lujain Kraszewski: [name] Other Clinician: Referring Armany Mano: Treating Champayne Kocian/Extender: [name] in Treatment: 11 Visit Information History Since Last Visit Added or deleted any medications: No Patient Arrived: [name] Any new allergies or adverse reactions: No Arrival Time: 09:50 Had a fall or experienced change in No Accompanied By: self activities of daily living that may affect Transfer Assistance: None risk of falls: Patient Requires Transmission-Based Precautions: No Signs or symptoms of abuse/neglect since last visito No Patient Has Alerts: No Hospitalized since last visit: No Implantable device outside of the clinic excluding No cellular tissue based products placed in the center since last visit: Has Dressing in Place as Prescribed: Yes Has Compression in Place as Prescribed: Yes Pain Present Now: No Electronic Signature(s) Signed: 04/11/2022 8:33:02 AM By: [name] RN Entered By: [name] on 04/10/2022 09:59:28 -------------------------------------------------------------------------------- Compression Therapy Details Patient Name: Date of Service: Erin Face D. 04/10/2022 9:30 A M Medical Record Number: [id number] Patient Account Number: [id number] Date of Birth/Sex: Treating RN: Jul 08, 1955 (67 y.o. [name] Primary Care Ashur Glatfelter: [name] Other Clinician: Referring Laurice Iglesia: Treating Shaunte Tuft/Extender: [name] in Treatment: 11 Compression Therapy Performed for Wound Assessment: Wound #7 Left,Anterior Lower  Leg Performed By: Clinician [name], RN Compression Type: Four Layer Post Procedure Diagnosis Same as Pre-procedure Electronic Signature(s) Signed: 04/11/2022 8:33:02 AM By: [name] RN Entered By: [name] on 04/10/2022 10:28:05 -------------------------------------------------------------------------------- Encounter Discharge Information Details Patient Name: Date of Service: Erin Face D. 04/10/2022 9:30 A M Medical Record Number: [id number] Patient Account Number: [id number] Date of Birth/Sex: Treating RN: 1955-06-22 (67 y.o. [name] Primary Care Olia Hinderliter: [name] Other Clinician: Referring Keion Neels: Treating Ovid Witman/Extender: [name] in Treatment: 11 Encounter Discharge Information Items Post Procedure Vitals Discharge Condition: Stable Temperature (F): 98 Ambulatory Status: Cane Pulse (bpm): 78 Discharge Destination: Home Respiratory Rate (breaths/min): 18 Transportation: Private Auto Blood Pressure (mmHg): 116/78 [name], [name] ([id number]) [phone].pdf Page 2 of 7 Accompanied By: self Schedule Follow-up Appointment: Yes Clinical Summary of Care: Patient Declined Electronic Signature(s) Signed: 04/11/2022 8:33:02 AM By: [name] RN Entered By: [name] on 04/11/2022 08:13:42 -------------------------------------------------------------------------------- Lower Extremity Assessment Details Patient Name: Date of Service: [name], [name]. 04/10/2022 9:30 A M Medical Record Number: [id number] Patient Account Number: [id number] Date of Birth/Sex: Treating RN: Aug 02, 1955 (68 y.o. [name] Primary Care Dalia Jollie: [name] Other Clinician: Referring Caraline Deutschman: Treating Aranza Geddes/Extender: [name] in Treatment: 11 Edema Assessment Assessed: [Left: No] [Right: No] [Left: Edema] [Right: :] Calf Left: Right: Point  of Measurement: From Medial Instep 38.5 cm Ankle Left: Right: Point of Measurement: From Medial Instep 22 cm Vascular Assessment Pulses: Dorsalis Pedis Palpable: [Left:Yes] Electronic Signature(s) Signed: 04/11/2022 8:33:02 AM By: [name] RN Entered By: [name] on 04/10/2022 10:00:16 -------------------------------------------------------------------------------- Multi Wound Chart Details Patient Name: Date of Service: Erin Face D. 04/10/2022 9:30 A M Medical Record Number: [id number] Patient Account Number: [id number] Date of Birth/Sex: Treating RN: 1956-01-09 (67 y.o. F) Primary Care Torry Adamczak: [name] Other Clinician: Referring Usher Hedberg: Treating Keshawna Dix/Extender: [name] in Treatment: 11 Vital Signs Height(in): 62 Pulse(bpm): 78 Weight(lbs): [id number] Blood Pressure(mmHg): 116/78  Body Mass Index(BMI): 40.6 Temperature(F): 98 Respiratory Rate(breaths/min): 18 [7:Photos:] [N/A:N/A] Left, Anterior Lower Leg N/A N/A Wound Location: Blister N/A N/A Wounding Event: Lymphedema N/A N/A Primary Etiology: Cataracts, Lymphedema, N/A N/A Comorbid History: Hypertension, Peripheral Venous Disease, Osteoarthritis 01/22/2022 N/A N/A Date Acquired: 11 N/A N/A Weeks of Treatment: Open N/A N/A Wound Status: No N/A N/A Wound Recurrence: 1.7x1.8x0.2 N/A N/A Measurements L x W x D (cm) 2.403 N/A N/A A (cm) : rea 0.481 N/A N/A Volume (cm) : 22.70% N/A N/A % Reduction in A rea: 22.70% N/A N/A % Reduction in Volume: Full Thickness Without Exposed N/A N/A Classification: Support Structures Medium N/A N/A Exudate A mount: Serosanguineous N/A N/A Exudate Type: red, brown N/A N/A Exudate Color: Distinct, outline attached N/A N/A Wound Margin: Small (1-33%) N/A N/A Granulation A mount: Pink N/A N/A Granulation Quality: Large (67-100%) N/A N/A Necrotic A mount: Eschar, Adherent Slough N/A N/A Necrotic  Tissue: Fat Layer (Subcutaneous Tissue): Yes N/A N/A Exposed Structures: Fascia: No Tendon: No Muscle: No Joint: No Bone: No Small (1-33%) N/A N/A Epithelialization: Debridement - Selective/Open Wound N/A N/A Debridement: Pre-procedure Verification/Time Out 10:15 N/A N/A Taken: Lidocaine 4% Topical Solution N/A N/A Pain Control: Necrotic/Eschar, Slough N/A N/A Tissue Debrided: Non-Viable Tissue N/A N/A Level: 3.06 N/A N/A Debridement A (sq cm): rea Curette N/A N/A Instrument: Minimum N/A N/A Bleeding: Pressure N/A N/A Hemostasis A chieved: 0 N/A N/A Procedural Pain: 0 N/A N/A Post Procedural Pain: Procedure was tolerated well N/A N/A Debridement Treatment Response: 1.7x1.8x0.2 N/A N/A Post Debridement Measurements L x W x D (cm) 0.481 N/A N/A Post Debridement Volume: (cm) No Abnormalities Noted N/A N/A Periwound Skin Texture: Maceration: Yes N/A N/A Periwound Skin Moisture: Dry/Scaly: No Erythema: Yes N/A N/A Periwound Skin Color: Hemosiderin Staining: Yes Circumferential N/A N/A Erythema Location: No Abnormality N/A N/A Temperature: Compression Therapy N/A N/A Procedures Performed: Debridement Treatment Notes Electronic Signature(s) Signed: 04/10/2022 10:34:27 AM By: [name] MD FACS Entered By: [name] on 04/10/2022 10:34:27 -------------------------------------------------------------------------------- Multi-Disciplinary Care Plan Details Patient Name: Date of Service: Erin Face D. 04/10/2022 9:30 A M Medical Record Number: [id number] Patient Account Number: [id number] Date of Birth/Sex: Treating RN: 08-15-1955 (67 y.o. [name] Primary Care Ardene Remley: [name] Other Clinician: Referring Amil Bouwman: Treating Jjesus Dingley/Extender: [name] in Treatment: [address] ROCELIA, DAGRACA D ([id number]) [id number] Page 4 of 7 Abuse / Safety / Falls / Self  Care Management Nursing Diagnoses: History of Falls Impaired physical mobility Goals: Patient/caregiver will identify factors that restrict self-care and home management Date Initiated: 01/22/2022 Target Resolution Date: 06/12/2022 Goal Status: Active Interventions: Assess fall risk on admission and as needed Assess: immobility, friction, shearing, incontinence upon admission and as needed Notes: Wound/Skin Impairment Nursing Diagnoses: Impaired tissue integrity Knowledge deficit related to ulceration/compromised skin integrity Goals: Patient/caregiver will verbalize understanding of skin care regimen Date Initiated: 01/22/2022 Target Resolution Date: 06/12/2022 Goal Status: Active Interventions: Assess ulceration(s) every visit Treatment Activities: Skin care regimen initiated : 01/22/2022 Topical wound management initiated : 01/22/2022 Notes: Electronic Signature(s) Signed: 04/11/2022 8:33:02 AM By: [name] RN Entered By: [name] on 04/11/2022 08:12:33 -------------------------------------------------------------------------------- Pain Assessment Details Patient Name: Date of Service: Erin Face D. 04/10/2022 9:30 A M Medical Record Number: [id number] Patient Account Number: [id number] Date of Birth/Sex: Treating RN: [birth year] (67 y.o. [name] Primary Care Goku Harb: [name] Other Clinician: Referring Alicen Donalson: Treating Terasa Orsini/Extender: [name] in Treatment: 11 Active Problems Location of Pain Severity and Description  of Pain Patient Has Paino No [address] [place], [place] D ([id number]) [id number] Page 5 of 7 Pain Management and Medication Current Pain Management: Electronic Signature(s) Signed: 04/11/2022 8:33:02 AM By: [name] RN Entered By: [name] on 04/10/2022  10:14:39 -------------------------------------------------------------------------------- Patient/Caregiver Education Details Patient Name: Date of Service: [name] 2/27/2024andnbsp9:30 [place] Record Number: [id number] Patient Account Number: [id number] Date of Birth/Gender: Treating RN: [birth year] (67 y.o. [name] Primary Care Physician: [name] Other Clinician: Referring Physician: Treating Physician/Extender: [name] in Treatment: 11 Education Assessment Education Provided To: Patient Education Topics Provided Wound/Skin Impairment: Methods: Explain/Verbal Responses: Return demonstration correctly Electronic Signature(s) Signed: 04/11/2022 8:33:02 AM By: [name] RN Entered By: [name] on 04/11/2022 08:12:45 -------------------------------------------------------------------------------- Wound Assessment Details Patient Name: Date of Service: Erin Face D. 04/10/2022 9:30 A M Medical Record Number: [id number] Patient Account Number: [id number] Date of Birth/Sex: Treating RN: 06-24-1955 (67 y.o. [name] Primary Care Alegria Dominique: [name] Other Clinician: Referring Areli Frary: Treating Christne Platts/Extender: [name] in Treatment: 11 Wound Status Wound Number: 7 Primary Lymphedema Etiology: Wound Location: Left, Anterior Lower Leg Wound Open Wounding Event: Blister Status: Date Acquired: 01/22/2022 Comorbid Cataracts, Lymphedema, Hypertension, Peripheral Venous Weeks Of Treatment: 11 History: Disease, Osteoarthritis Clustered Wound: No Photos Wound Measurements [name], [name] ([id number]) Length: (cm) 1.7 Width: (cm) 1.8 Depth: (cm) 0.2 Area: (cm) 2.403 Volume: (cm) 0.481 [id number] Page 6 of 7 % Reduction in Area: 22.7% % Reduction in Volume: 22.7% Epithelialization: Small (1-33%) Tunneling: No Undermining:  No Wound Description Classification: Full Thickness Without Exposed Support Structures Wound Margin: Distinct, outline attached Exudate Amount: Medium Exudate Type: Serosanguineous Exudate Color: red, brown Foul Odor After Cleansing: No Slough/Fibrino Yes Wound Bed Granulation Amount: Small (1-33%) Exposed Structure Granulation Quality: Pink Fascia Exposed: No Necrotic Amount: Large (67-100%) Fat Layer (Subcutaneous Tissue) Exposed: Yes Necrotic Quality: Eschar, Adherent Slough Tendon Exposed: No Muscle Exposed: No Joint Exposed: No Bone Exposed: No Periwound Skin Texture Texture Color No Abnormalities Noted: Yes No Abnormalities Noted: No Erythema: Yes Moisture Erythema Location: Circumferential No Abnormalities Noted: No Hemosiderin Staining: Yes Dry / Scaly: No Maceration: Yes Temperature / Pain Temperature: No Abnormality Treatment Notes Wound #7 (Lower Leg) Wound Laterality: Left, Anterior Cleanser Soap and Water Discharge Instruction: May shower and wash wound with dial antibacterial soap and water prior to dressing change. Wound Cleanser Discharge Instruction: Cleanse the wound with wound cleanser prior to applying a clean dressing using gauze sponges, not tissue or cotton balls. Peri-Wound Care Ketoconazole Cream 2% Discharge Instruction: Apply Ketoconazole as directed Triamcinolone 15 (g) Discharge Instruction: Use triamcinolone 15 (g) as directed Zinc Oxide Ointment 30g tube Discharge Instruction: Apply Zinc Oxide to periwound with each dressing change Sween Lotion (Moisturizing lotion) Discharge Instruction: Apply moisturizing lotion as directed Topical Mupirocin Ointment Discharge Instruction: Apply Mupirocin (Bactroban) as instructed Primary Dressing Sorbalgon AG Dressing, 4x4 (in/in) Discharge Instruction: Apply to wound bed as instructed Secondary Dressing Woven Gauze Sponge, Non-Sterile 4x4 in Discharge Instruction: Apply over primary dressing  as directed. Zetuvit Plus 4x8 in Discharge Instruction: Apply over primary dressing as directed. Secured With Compression Wrap FourPress (4 layer compression wrap) Discharge Instruction: Apply four layer compression as directed. May also use Urgo K2 compression system as alternative. [name], [name] ([id number]) [id number] Page 7 of 7 Compression Stockings Add-Ons Electronic Signature(s) Signed: 04/11/2022 8:33:02 AM By: [name] RN Entered By: [name] on 04/10/2022 10:12:11 -------------------------------------------------------------------------------- Vitals Details Patient Name: Date  of Service: [name], [name] 04/10/2022 9:30 A M Medical Record Number: [id number] Patient Account Number: [id number] Date of Birth/Sex: Treating RN: [birth year] (67 y.o. [name] Primary Care Illyria Sobocinski: [name] Other Clinician: Referring Escher Harr: Treating Katelee Schupp/Extender: [name] in Treatment: 11 Vital Signs Time Taken: 09:59 Temperature (F): 98 Height (in): 62 Pulse (bpm): 78 Weight (lbs): 222 Respiratory Rate (breaths/min): 18 Body Mass Index (BMI): 40.6 Blood Pressure (mmHg): 116/78 Reference Range: 80 - 120 mg / dl Electronic Signature(s) Signed: 04/11/2022 8:33:02 AM By: [name] RN Entered By: [name] on 04/10/2022 10:14:30

## 2022-04-12 NOTE — Progress Notes (Signed)
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 Instructions: Apply Zinc Oxide to periwound with each dressing change Peri-Wound Care: Sween Lotion (Moisturizing lotion) 1 x Per Week/30 Days Discharge Instructions: Apply moisturizing lotion as directed Topical: Mupirocin Ointment 1 x Per Week/30 Days Discharge Instructions: Apply Mupirocin (Bactroban) as instructed Prim Dressing: Sorbalgon AG Dressing, 4x4 (in/in) 1 x Per Week/30 Days ary Discharge Instructions: Apply to wound bed as instructed Secondary Dressing: Woven Gauze Sponge, Non-Sterile 4x4 in 1 x Per Week/30 Days Discharge Instructions: Apply over primary dressing as directed. Secondary Dressing: Zetuvit Plus 4x8 in 1 x Per Week/30 Days Discharge Instructions: Apply over primary dressing as directed. Compression Wrap: FourPress (4 layer compression wrap) 1 x Per Week/30 Days Discharge Instructions: Apply four layer compression as directed. May also use Urgo K2 compression system as alternative. Compression Wrap: Netting #5 1 x Per Week/30 Days [name], [name] ([id number])  [phone].pdf Page 6 of 12 Electronic Signature(s) Signed: 04/10/2022 11:31:02 AM By: [name] MD FACS Entered By: [name] on 04/10/2022 10:37:32 -------------------------------------------------------------------------------- Problem List Details Patient Name: Date of Service: [name] 04/10/2022 9:30 A M Medical Record Number: [id number] Patient Account Number: [id number] Date of Birth/Sex: Treating RN: Jul 03, 1955 (67 y.o. 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 04/10/2022 9:30 A M Medical Record Number: [id number] Patient Account Number: [id number] Date of Birth/Sex: Treating RN: 1955-02-28 (67 y.o. F) Primary Care Provider: [place] [name] Other Clinician: Referring Provider: Treating Provider/Extender: [name] in Treatment: 11 Subjective Chief Complaint Information obtained from Patient 04/19/2021: The patient is here for ongoing follow-up regarding 2 left lower extremity wounds. 01/22/2022; patient returns to clinic with a wound on the left anterior lower leg secondary to trauma History of Present Illness (HPI)  ADMISSION 12/10/2017 This is a 67 year old woman  who works in patient accounting a [employer]. She tells [name] that she fell on the gravel driveway in July. She developed injuries on her distal lower leg which have not healed. She saw her primary physician on 11/15/2017 who noted her left shin injuries. Gave her antibiotics. At that point the wounds were almost circumferential however most were less than 1.5 cm. Weeping edema fluid was noted. She was referred here for evaluation. The patient has a history of chronic lower extremity edema. 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 She has had prior history with leg weeping fluid. She does not wear compression stockings. She is not doing anything specific to these wound areas. 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 She also has lymphedema. Put her in 3 layer compression and silver alginate last week. She has 3 small wounds with depth just lateral to the tibia. More problematically than [name], [name] ([id number]) [id number].pdf Page 7 of 12 this she has numerous shallow areas some of which are almost canal like in shape with tightly adherent painful debris. It would be very difficult and time- consuming to go through this and attempt to individually debride all these areas.. I changed her to collagen today to see if that would help with any of the surface debris on some of these wounds. Otherwise we will not be able to put this in compression we have to have someone change the dressing. The patient is not eligible for home health 12/25/17 on evaluation today patient actually appears to  be doing rather well in regard to the ulcer on her lower extremity. Fortunately there does not appear to be evidence of infection at this time. She has been tolerating the dressing changes without complication. This includes the compression wrap. The only issue she had was that the wrap was initially placed over her bunion region which actually calls her some discomfort and pain. Other than that things seem to be going rather well. 01/01/2018 Seen today for follow-up and management of left lower extremity wound and lymphedema. T oday she presents with a new wound towards to the left lateral LE. Recently treated with a 7 day course of amoxicillin; reason for antibiotic dose is unknown at this time. Tolerating current treatment of collagen with 4- layer wraps. She obtained a venous reflux study on 12/25/17. Studies show on the right abnormal reflux times of the popliteal vein, great saphenous vein at the saphenofemoral junction at the proximal thigh, great saphenous vein at the mid calf, and origin of the small saphenous vein.No superficial thrombosis. No deep vein thrombosis in the common femoral, femoral,and popliteal veins. Left abnormal reflex times as well of the common femoral vein, popliteal vein, and a great saphenous vein at the saphenofemoral junction, In great saphenous vein at the mid thigh w/o thrombosis. Has any issues or concerns during visit today. Recommended follow-up to vascular specialist due to abnormalities from the venous reflux study. Denies fever, pain, chills, dizziness, nausea, or vomiting. 01/08/18 upon evaluation today the patient actually seems to be showing some signs of improvement in my opinion at this point in regard  to the lower extremity ulcerated areas. She still has a lot of drainage but fortunately nothing that appears to be too significant currently. I have been very happy with the overall progress I see today compared to where things were during the last  evaluation that I had with her. Nonetheless she has not had her appointment with the vein specialist as of yet in fact we were able to get this approved for her today and confirmed with them she will be seeing them on December 26. Nonetheless in general I do feel like the compression wraps is doing well for her. 01/17/18; quite a bit of improvement since last time I saw this patient she has a small open area remaining on the left lateral calf and even smaller area medially. She has lymphedema chronic stasis changes with distal skin fibrosis. She has an appointment with vascular surgery later this month 01/24/2018; the patient's medial leg has closed. Still a small open area on the left lateral leg. She states that the 4 layer compression we put on last week was too tight and she had to take it off over a few days ago. We have had resultant increase in her lymphedema in the dorsal foot and a proximal calf. Fortunately that does not seem to have resulted in any deterioration in her wounds The patient is going to need compression stockings. We have given her measurements to phone elastic therapy in [place]. She has vascular surgery consult on December 26 02/07/18; she's had continuous contraction on the left lateral leg wound which is now very small. Currently using silver alginate under 3 layer compression She saw Dr. Trula Slade of vascular surgery on 02/06/18. It was noted that she had a normal reflux times in the popliteal vein, great saphenous vein at the saphenofemoral junction, great saphenous vein at the proximal thigh great saphenous vein at the mid calf and origin of the small saphenous vein. It was noted that she had significant reflux in the left saphenous veins with diameter measurements in the 0.7-0.8 cm range. It was felt she would benefit from laser ablation to help minimize the risk of ulcer recurrence. It was recommended that she wear 20-30 thigh-high compression stockings and have follow-up  in 4-6 weeks 02/17/2018; I thought this lady would be healed however her compression slipped down and she developed increasing swelling and the wound is actually larger. 1/13; we had deterioration last week after the patient's compression slipped down and she developed periwound swelling. I increased her compression before layers we have been using silver alginate we are a lot better again today. She has her compression stockings in waiting 1/23; the patient's wounds are totally healed today. She has her stockings. This was almost circumferential skin damage. She follows up with Dr. Trula Slade of vascular surgery next Monday. READMISSION 02/21/2021 This is a now 67 year old woman that we had in clinic here discharging in January 2020 with wounds on her left calf chronic venous insufficiency. She was discharged with 30/40 stockings. It does not sound like she has worn stockings in about a year largely from not being able to get them on herself. In November she developed new blisters on her legs an area laterally is opened into a fairly sizable wound. She has weeping posteriorly as well. She has been using Neosporin and Band-Aids. The patient did see Dr. Trula Slade in 2019 and 2020. He felt she might benefit from laser ablation in her left saphenous veins although because of the wound that healed I do  not think he went through with it. She might benefit from seeing him again. Her ABI on the left is 1. 1/17; patient's wound on the posterior left calf is closed she has the 2 large areas last week. We put her in 4-layer compression for edema control is a lot better. Our intake nurse noted greenish drainage and odor. We have been using silver alginate 1/25; PCR culture I did have the substantial wound area on the left lateral lower leg showed staff aureus and group A strep. Low titers of coag negative staph which are probably skin contaminants. Resistance detected to tetracycline methicillin and macrolides. I  gave her a starter kit of Nuzyra 150 mg x 3 for 2 days then 300 mg for a further 7 days. Marked odor considerable increase in surrounding erythema. We are using Iodoflex last week however I have changed to silver alginate with underlying Bactroban 2/1; she is completing her [place] tomorrow. The degree of erythema around the wounds looks a lot better. Odor has improved. I gave her silver alginate and Bactroban last week and changing her back to Iodoflex to continue with ongoing debridement 2/8; the periwound looks a lot better. Surface of the wound also looks somewhat better although there is still ongoing debridement to be done we have been using Iodoflex under compression. Primary dressing and silver alginate 2/15; left posterior calf. Some improvement in the surface of the wound but still very gritty we have been using Iodoflex under compression. 04/05/2021: Left lateral posterior calf. She continues to have significant amounts of drainage, some of which is probably comprised of the Iodoflex microbleeds. There is no odor to the drainage. The satellite lesion on the more posterior aspect of the calf is epithelializing nicely and has contracted quite a bit. There is robust granulation tissue in the dominant wound with minimal adherent slough. 04/12/2021: The satellite lesion has nearly closed. She continues to have good granulation tissue with minimal slough of the larger primary wound. She continues to have a fair amount of drainage, but I think this is less secondary to switching her dressing from Iodoflex to Prisma. 04/19/2021: The satellite lesion is almost completely epithelialized. The larger primary wound continues to contract with good granulation tissue and minimal slough. She is currently in [place] with compression. 04/26/2021: The satellite lesion has closed. The larger primary wound has contracted further and the granulation tissue is robust without being hypertrophic. Minimal slough is  present. 05/03/2021: The satellite lesion remains closed. The larger primary wound has a bit of slough, but has also contracted further with good perimeter epithelialization. Good granulation tissue at the wound surface. There was some greenish drainage on the dressing when it was removed; it is not the blue-green typically associated with Pseudomonas aeruginosa, however. 05/10/2021: The primary wound continues to contract. There is minimal slough. The granulation tissue at 9:00 is a bit hypertrophic. No significant drainage. No odor. [name], [name] ([id number]) [id number] Page 8 of 12 05/17/2021: The wound is a little bit smaller today with good granulation tissue. Minimal slough. No significant drainage or odor. 05/24/2021: The wound continues to contract and has a nice base of granulation tissue. Small amount of slough. No concern for infection. 05/31/2021: For some reason, the wound measured slightly larger today but overall it still appears to be in good condition with a nice base of granulation tissue and minimal slough. 06/07/2021: The wound is smaller today. Good granulation tissue and minimal slough. 06/14/2021: The wound is unchanged in size. She continues  to accumulate some slough. Good granulation tissue on the surface. 06/20/2021: The wound is smaller today. Minimal slough with good granulation tissue. 06/27/2021: The wound on her left lateral leg is smaller today with just a bit of slough and eschar accumulation. Unfortunately, she has opened a new superficial wound on her right lower extremity. She has 3+ pitting edema to the knees on that leg and she does not wear compression stockings. 07/04/2021: In addition to the superficial wound on her right lower extremity that she opened up last week, she has opened 3 additional sites on the same leg. Her compression wraps were clearly not in correct position when she came to clinic today as they were at about the mid calf  level rather than to the tibial tuberosity. She says that they slipped earlier and she had to come back on Friday to have them redone. The wound on her left lateral leg is perhaps slightly larger with some slough accumulation. 07/11/2021: The superficial wounds on her right lower extremity are nearly closed. They just have a thin layer of eschar overlying them. The wound on her left lateral leg is a little bit shallower and has a bit of slough accumulation. 07/17/2021: The right medial lower extremity leg wounds have almost completely closed; one of them just has a tiny opening with a little bit of serous drainage. The left lateral leg wound is really unchanged. It seems to be stalled. 07/24/2021: The right leg wounds are completely closed. The left lateral leg wound is actually bigger today. It is a bit more tender. There is a minor accumulation of slough on the surface. 07/31/2021: The culture that I took last week was positive for MRSA. We added mupirocin under the silver alginate and I prescribed doxycycline. She has been tolerating this well. The wound is smaller today but does have slough accumulation. No significant pain or drainage. 08/07/2021: She completed her course of doxycycline. The wound looks much better today. It is smaller and the periwound is less inflamed. She does have some slough accumulation on the surface. 08/14/2021: The wound continues to contract. There is slough on the wound surface, but the periwound is intact without inflammation or induration. 08/21/2021: The wound is down to just 2 small open sites. They both have a bit of slough accumulation. 08/28/2021: The wound is down to just 1 small open site with a little bit of slough and eschar accumulation. 09/04/2021: The wound continues to contract but remains open. It is clean without any slough. 09/12/2021: No real change in the overall wound dimensions, but it is flush with the surrounding skin. There is a little bit of slough  accumulation. Edema control is good. 09/22/2021: The wound is smaller and even more superficial. Minimal slough accumulation. Good control of edema. 09/29/2021: The wound continues to contract. She reports that it was a little bit "twingey" during the week. It is a little bit dry on inspection today. Light accumulation of slough. Good edema control. 10/06/2021: The wound is about the same size today. There is a little slough on the surface. Edema control is good. 10/13/2021: The wound is about the same size but more superficial. A little bit of slough on the surface. 10/23/2021: The wound is slightly smaller and continues to fill in. Minimal slough on the wound surface. 10/30/2021: The wound continues to contract but has not yet closed. Light slough and eschar present. 11/06/2021: The wound persists, but seems a little bit more epithelialized. There is still slough and eschar  accumulation. 11/14/2021: The wound continues to contract but persists. Light eschar around the wound. 11/22/2021: The wound is down to just a pinhole opening. There is eschar overlying the surface. Edema control is excellent. 11/29/2021: Her wound is closed. READMISSION 01/22/2022 This is a patient to be discharged in October. She has severe chronic venous insufficiency at that time she had a wound on her left lower leg posteriorly also the right leg at some point. These closed. We discharged her in 30/40 mm stockings from elastic therapy which she has been wearing religiously. She tells [name] that she was traumatized by a dolly while shopping at [employer] about 2 to 3 weeks ago. She has been left with a left anterior lower leg wound. She comes in in another pair of stockings other than her 3040s. She does not have an arterial issue with her last ABI in the left at 1.2 12/18; this is a patient who has chronic venous insufficiency and recurrent venous insufficiency ulcers. She has a area on her left anterior lower leg and we readmitted her  to the clinic last week. We use silver alginate and 4-layer compression. ABI was 1.2 She brought in her stocking which was a 20/30 below-knee stocking from elastic therapy. She may require a 30/40 mm equivalent stockings after this wound heals. 02/06/2022: The intake nurse reported an odor coming from the wound when she first unwrapped it but this abated after the leg was washed. There is thick layer of slough on the surface. 02/13/2022: The wound is cleaner today. It measured larger, but on visual inspection, I think some crust of Iodoflex was included in the wound measurements; the actual wound itself looks about the same to slightly smaller. Edema control is good. 02/20/2022: The wound is cleaner again today. It is also measuring a little bit smaller, but there has been some moisture related periwound breakdown. Edema control is good. [name], [name] ([id number]) [id number] Page 9 of 12 02/27/2022: The intake nurse reported that the wound measures larger, but some of this ended up being crusted on Iodoflex. There is still some periwound moisture. Still with thick slough accumulation. Edema control is good. 03/06/2022: The wound is cleaner and more superficial, but did measure larger because of the inclusion of a satellite area. Still with some slough accumulation but less so than on prior visits. 03/13/2022: The wound measured a little bit smaller today. There is slough and eschar accumulation, as per usual. 03/20/2022: Her wound is larger and deeper today. The entire surface appears nonviable. There is more periwound erythema and threatened tissue breakdown. 03/27/2022: The culture that I took last week was positive for MRSA. We had empirically applied topical mupirocin to her wound. Today the wound is smaller and cleaner and less painful. There is still a layer of slough on the surface. 04/03/2022: The wound was measured slightly larger today, but appears roughly the same to  my eye. There is a layer of slough on the surface. Underneath, the tissue is quite fibrotic. 04/10/2022: The wound is slightly smaller today. There is slough and eschar accumulation. For some reason, her wrap got changed from 4 layers to 3 layers and her edema control is not as good, as a result. Patient History Information obtained from Patient. Family History Heart Disease - Mother,Father, Hypertension - Mother,Father, Kidney Disease - Mother, Thyroid Problems - Mother, No family history of Cancer, Diabetes, Hereditary Spherocytosis, Lung Disease, Seizures, Stroke, Tuberculosis. Social History Never smoker, Marital Status - Single, Alcohol Use - Never, Drug  Use - No History, Caffeine Use - Daily - coffee. Medical History Eyes Patient has history of Cataracts Hematologic/Lymphatic Patient has history of Lymphedema Cardiovascular Patient has history of Hypertension, Peripheral Venous Disease Integumentary (Skin) Denies history of History of Burn Musculoskeletal Patient has history of Osteoarthritis Hospitalization/Surgery History - colostomy reversal. 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 Medical A Surgical History Notes nd Constitutional Symptoms (General Health) morbid obesity Cardiovascular schamberg disease, hyperlipidemia Gastrointestinal diverticulitis , h/o obstruction due to diverticulitis , colostomy and colostomy reversal Endocrine hypothyroidism Objective Constitutional no acute distress. Vitals Time Taken: 9:59 AM, Height: 62 in, Weight: 222 lbs, BMI: 40.6, Temperature: 98 F, Pulse: 78 bpm, Respiratory Rate: 18 breaths/min, Blood Pressure: 116/78 mmHg. Respiratory Normal work of breathing on room air. General Notes: 04/10/2022: The wound is slightly smaller today. There is slough and eschar accumulation. For some reason, her wrap got changed from 4 layers to 3 layers and her edema control is not as good, as a result. Integumentary (Hair, Skin) Wound #7 status is  Open. Original cause of wound was Blister. The date acquired was: 01/22/2022. The wound has been in treatment 11 weeks. The wound is located on the Left,Anterior Lower Leg. The wound measures 1.7cm length x 1.8cm width x 0.2cm depth; 2.403cm^2 area and 0.481cm^3 volume. There is Fat Layer (Subcutaneous Tissue) exposed. There is no tunneling or undermining noted. There is a medium amount of serosanguineous drainage noted. The wound margin is distinct with the outline attached to the wound base. There is small (1-33%) pink granulation within the wound bed. There is a large (67-100%) amount of necrotic tissue within the wound bed including Eschar and Adherent Slough. The periwound skin appearance had no abnormalities noted for texture. The periwound skin appearance exhibited: Maceration, Hemosiderin Staining, Erythema. The periwound skin appearance did not exhibit: Dry/Scaly. The surrounding wound skin color is noted with erythema which is circumferential. Periwound temperature was noted as No Abnormality. 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 There was a Selective/Open Wound Non-Viable Tissue Debridement with a total area of 3.06 sq cm performed by [name], MD. With the following instrument(s): Curette to remove Non-Viable tissue/material. Material removed includes Eschar and Slough and after achieving pain control using Lidocaine 4% T opical Solution. No specimens were taken. A time out was conducted at 10:15, prior to the start of the procedure. A Minimum amount of bleeding was controlled with Pressure. The procedure was tolerated  well with a pain level of 0 throughout and a pain level of 0 following the procedure. Post Debridement Measurements: 1.7cm length x 1.8cm width x 0.2cm depth; 0.481cm^3 volume. Character of Wound/Ulcer Post Debridement is improved. Post procedure Diagnosis Wound #7: Same as Pre-Procedure General Notes: Scribed for Dr. Celine [name] by [name]. Pre-procedure diagnosis of Wound #7 is a Lymphedema located on the Left,Anterior Lower Leg . There was a Four Layer Compression Therapy Procedure by [name], RN. Post procedure Diagnosis Wound #7: Same as Pre-Procedure Plan Follow-up Appointments: Return Appointment in 1 week. - Dr. Celine [name] Room 3 Anesthetic: (In clinic) Topical Lidocaine 5% applied to wound bed Bathing/ Shower/ Hygiene: May shower with protection but do not get wound dressing(s) wet. Protect dressing(s) with water repellant cover (for example, large plastic bag)  or a cast cover and may then take shower. - Please do not get the Left lower leg Compression wraps wet. Edema Control - Lymphedema / SCD / Other: Avoid standing for long periods of time. Patient to wear own compression stockings every day. Exercise regularly Moisturize legs daily. WOUND #7: - Lower Leg Wound Laterality: Left, Anterior Cleanser: Soap and Water 1 x Per Week/30 Days Discharge Instructions: May shower and wash wound with dial antibacterial soap and water prior to dressing change. Cleanser: Wound Cleanser 1 x Per Week/30 Days Discharge Instructions: Cleanse the wound with wound cleanser prior to applying a clean dressing using gauze sponges, not tissue or cotton balls. Peri-Wound Care: Ketoconazole Cream 2% 1 x Per Week/30 Days Discharge Instructions: Apply Ketoconazole as directed Peri-Wound Care: Triamcinolone 15 (g) 1 x Per Week/30 Days Discharge Instructions: Use triamcinolone 15 (g) as directed Peri-Wound Care: Zinc Oxide Ointment 30g tube 1 x Per Week/30 Days Discharge Instructions: Apply Zinc Oxide  to periwound with each dressing change Peri-Wound Care: Sween Lotion (Moisturizing lotion) 1 x Per Week/30 Days Discharge Instructions: Apply moisturizing lotion as directed Topical: Mupirocin Ointment 1 x Per Week/30 Days Discharge Instructions: Apply Mupirocin (Bactroban) as instructed Prim Dressing: Sorbalgon AG Dressing, 4x4 (in/in) 1 x Per Week/30 Days ary Discharge Instructions: Apply to wound bed as instructed Secondary Dressing: Woven Gauze Sponge, Non-Sterile 4x4 in 1 x Per Week/30 Days Discharge Instructions: Apply over primary dressing as directed. Secondary Dressing: Zetuvit Plus 4x8 in 1 x Per Week/30 Days Discharge Instructions: Apply over primary dressing as directed. Com pression Wrap: FourPress (4 layer compression wrap) 1 x Per Week/30 Days Discharge Instructions: Apply four layer compression as directed. May also use Urgo K2 compression system as alternative. Com pression Wrap: Netting #5 1 x Per Week/30 Days 04/10/2022: The wound is slightly smaller today. There is slough and eschar accumulation. For some reason, her wrap got changed from 4 layers to 3 layers and her edema control is not as good, as a result. I used a curette to debride slough and eschar from the wound. We will continue topical mupirocin with silver alginate. We will resume 4-layer compression to achieve better edema control. Follow-up in 1 week. [name], [name] ([id number]) [id number] Page 11 of 12 Electronic Signature(s) Signed: 04/10/2022 10:38:07 AM By: [name] MD FACS Entered By: [name] on 04/10/2022 10:38:07 -------------------------------------------------------------------------------- HxROS Details Patient Name: Date of Service: [name] 04/10/2022 9:30 A M Medical Record Number: [id number] Patient Account Number: [id number] Date of Birth/Sex: Treating RN: 01/11/1956 (67 y.o. F) Primary Care Provider: Glendale [name] Other  Clinician: Referring Provider: Treating Provider/Extender: [name] in Treatment: 11 Information Obtained From Patient Constitutional Symptoms (General Health) Medical History: Past Medical History Notes: morbid obesity Eyes Medical History: Positive for: Cataracts Hematologic/Lymphatic Medical History: Positive for: Lymphedema Cardiovascular Medical History: Positive for: Hypertension; Peripheral Venous Disease Past Medical History Notes: schamberg disease, hyperlipidemia Gastrointestinal Medical History: Past Medical History Notes: diverticulitis , h/o obstruction due to diverticulitis , colostomy and colostomy reversal Endocrine Medical History: Past Medical History Notes: hypothyroidism Integumentary (Skin) Medical History: Negative for: History of Burn Musculoskeletal Medical History: Positive for: Osteoarthritis HBO Extended History Items Eyes: Cataracts Immunizations Pneumococcal Vaccine: Received Pneumococcal Vaccination: Yes Received Pneumococcal Vaccination On or After 60th Birthday: Yes Implantable Devices None [name], [name] ([id number]) [id number] Page 12 of 12 Hospitalization / Surgery History Type of Hospitalization/Surgery colostomy reversal 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 Family and Social History Cancer: No; Diabetes: No; Heart Disease:  Yes - Mother,Father; Hereditary Spherocytosis: No; Hypertension: Yes - Mother,Father; Kidney Disease: Yes - Mother; Lung Disease: No; Seizures: No; Stroke: No; Thyroid Problems: Yes - Mother; Tuberculosis: No; Never smoker; Marital Status - Single; Alcohol Use: Never; Drug Use: No History; Caffeine Use: Daily - coffee; Financial Concerns: No; Food, Clothing or Shelter Needs: No; Support System Lacking: No; Transportation Concerns: No Electronic Signature(s) Signed: 04/10/2022 11:31:02 AM By: [name] MD FACS Entered By: [name] on  04/10/2022 10:36:45 -------------------------------------------------------------------------------- SuperBill Details Patient Name: Date of Service: Erin Face [name] 04/10/2022 Medical Record Number: [id number] Patient Account Number: [id number] Date of Birth/Sex: Treating RN: [birth year] (67 y.o. 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 with other specif Modifier: ied severity Quantity: 1 Physician Procedures : CPT4 Code Description Modifier [id number] - WC PHYS LEVEL 4 - EST PT 25 ICD-10 Diagnosis Description L97.928 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

## 2022-04-17 ENCOUNTER — Other Ambulatory Visit: Payer: Self-pay | Admitting: Nurse Practitioner

## 2022-04-17 ENCOUNTER — Encounter (HOSPITAL_BASED_OUTPATIENT_CLINIC_OR_DEPARTMENT_OTHER): Payer: Commercial Managed Care - PPO | Attending: General Surgery | Admitting: General Surgery

## 2022-04-17 DIAGNOSIS — E785 Hyperlipidemia, unspecified: Secondary | ICD-10-CM | POA: Diagnosis not present

## 2022-04-17 DIAGNOSIS — M1990 Unspecified osteoarthritis, unspecified site: Secondary | ICD-10-CM | POA: Insufficient documentation

## 2022-04-17 DIAGNOSIS — I890 Lymphedema, not elsewhere classified: Secondary | ICD-10-CM | POA: Diagnosis not present

## 2022-04-17 DIAGNOSIS — E039 Hypothyroidism, unspecified: Secondary | ICD-10-CM | POA: Insufficient documentation

## 2022-04-17 DIAGNOSIS — I10 Essential (primary) hypertension: Secondary | ICD-10-CM | POA: Insufficient documentation

## 2022-04-17 DIAGNOSIS — I872 Venous insufficiency (chronic) (peripheral): Secondary | ICD-10-CM | POA: Insufficient documentation

## 2022-04-17 DIAGNOSIS — L97928 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: Secondary | ICD-10-CM | POA: Insufficient documentation

## 2022-04-17 DIAGNOSIS — I739 Peripheral vascular disease, unspecified: Secondary | ICD-10-CM | POA: Diagnosis not present

## 2022-04-18 ENCOUNTER — Other Ambulatory Visit (HOSPITAL_COMMUNITY): Payer: Self-pay

## 2022-04-18 MED ORDER — MELOXICAM 7.5 MG PO TABS
7.5000 mg | ORAL_TABLET | Freq: Every day | ORAL | 0 refills | Status: DC
  Filled 2022-04-18: qty 30, 30d supply, fill #0

## 2022-04-19 ENCOUNTER — Other Ambulatory Visit (HOSPITAL_COMMUNITY): Payer: Self-pay

## 2022-04-19 NOTE — Progress Notes (Signed)
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 Days Discharge Instructions: Use triamcinolone 15 (g) as directed Peri-Wound Care: Zinc Oxide Ointment 30g tube 1 x Per Week/30 Days Discharge Instructions: Apply Zinc Oxide to periwound with each dressing change Peri-Wound Care: Sween Lotion (Moisturizing lotion) 1 x Per Week/30 Days Discharge Instructions: Apply moisturizing lotion as directed Topical: Mupirocin Ointment 1 x Per Week/30 Days Discharge Instructions: Apply Mupirocin (Bactroban) as instructed Prim Dressing: Sorbalgon AG Dressing, 4x4 (in/in) 1 x Per Week/30 Days ary Discharge Instructions: Apply to wound bed as instructed Secondary Dressing: Woven Gauze Sponge, Non-Sterile 4x4 in 1 x Per Week/30 Days Discharge Instructions: Apply over primary dressing as directed. Secondary Dressing: Zetuvit Plus 4x8 in 1 x Per Week/30 Days Discharge Instructions: Apply over primary dressing as directed. Compression Wrap: FourPress (4 layer compression wrap) 1 x Per Week/30 Days Discharge Instructions: Apply four layer compression as directed. May also use Urgo K2 compression system as alternative. Compression  Wrap: Netting #5 1 x Per Week/30 Days ILLYANNA, TISI ([id number]) [id number] Page 6 of 12 Electronic Signature(s) Signed: 04/17/2022 10:49:49 AM By: [name] MD FACS Entered By: [name] on 04/17/2022 10:40:12 -------------------------------------------------------------------------------- Problem List Details Patient Name: Date of Service: Erin Gerold [name] 04/17/2022 9:30 A M Medical Record Number: [id number] Patient Account Number: [id number] Date of Birth/Sex: Treating RN: 07-21-1955 (67 y.o. 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 04/17/2022 9:30 A M Medical Record Number: [id number] Patient Account Number: [id number] Date of Birth/Sex: Treating RN: [birth year] (66 y.o. F) Primary Care Provider: [name] Other Clinician: Referring Provider: Treating Provider/Extender: [name] in Treatment: 12 Subjective Chief Complaint Information obtained from Patient 04/19/2021: The patient is here for ongoing follow-up regarding 2 left lower extremity wounds. 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 She tells [name] that she fell on the gravel driveway in July. She developed injuries on her distal lower leg which have not healed. She saw her primary physician on 11/15/2017 who noted her left shin injuries. Gave her antibiotics. At that point the wounds were almost circumferential however most were less than 1.5 cm. Weeping edema fluid was noted. She was referred here for evaluation. The patient has a history of chronic lower extremity edema. 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 She has had prior history with leg weeping fluid. She does not wear compression stockings. She is not doing anything specific to these wound areas. The patient has a history of obesity, arthritis, peripheral vascular disease hypertension lower extremity edema and Schamberg's disease ABI in our clinic was 1.3 on the left DAESIA, CREEDEN D ([id number]) [id number] Page 7 of 12 12/17/2017; patient readmitted to the clinic last week. 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 She also has lymphedema. Put her in 3 layer compression and silver alginate last week. She has 3 small wounds with depth just lateral to the tibia. More problematically than this she has numerous shallow areas some of which are almost canal like in shape with tightly adherent painful debris. It would be very difficult and time- consuming to go through this and attempt to individually debride all these areas.. I changed her to collagen today to see if that would help with any of the surface debris on some of these wounds. Otherwise we will not be able to put this in compression we have to have someone change the dressing. The patient is not eligible for home  health 12/25/17 on evaluation today patient actually appears to be doing rather well in regard to the ulcer on her lower extremity. Fortunately there does not appear to be evidence of infection at this time. She has been tolerating the dressing changes without complication. This includes the compression wrap. The only issue she had was that the wrap was initially placed over her bunion region which actually calls her some discomfort and pain. Other than that things seem to be going rather well. 01/01/2018 Seen today for follow-up and management of left lower extremity wound and lymphedema. T oday she presents with a new wound towards to the left lateral LE. Recently treated with a 7 day course of amoxicillin; reason for antibiotic dose is unknown at this time. Tolerating current treatment of collagen with 4- layer wraps. She obtained a venous reflux study on 12/25/17. Studies show on the right abnormal reflux times of the popliteal vein, great saphenous vein at the saphenofemoral junction at the proximal thigh, great saphenous vein at the mid calf, and origin of the small saphenous vein.No superficial thrombosis. No deep vein thrombosis in the common femoral, femoral,and popliteal veins. Left abnormal reflex times as well of the common femoral vein, popliteal vein, and a great saphenous vein at the saphenofemoral junction, In great saphenous vein at the mid thigh w/o thrombosis. Has any issues or concerns during visit today. Recommended follow-up to vascular specialist due to abnormalities from the venous reflux study. Denies fever, pain, chills, dizziness, nausea, or vomiting. 01/08/18 upon evaluation today the patient actually seems to be showing some signs of improvement  in my opinion at this point in regard to the lower extremity ulcerated areas. She still has a lot of drainage but fortunately nothing that appears to be too significant currently. I have been very happy with the overall progress I  see today compared to where things were during the last evaluation that I had with her. Nonetheless she has not had her appointment with the vein specialist as of yet in fact we were able to get this approved for her today and confirmed with them she will be seeing them on December 26. Nonetheless in general I do feel like the compression wraps is doing well for her. 01/17/18; quite a bit of improvement since last time I saw this patient she has a small open area remaining on the left lateral calf and even smaller area medially. She has lymphedema chronic stasis changes with distal skin fibrosis. She has an appointment with vascular surgery later this month 01/24/2018; the patient's medial leg has closed. Still a small open area on the left lateral leg. She states that the 4 layer compression we put on last week was too tight and she had to take it off over a few days ago. We have had resultant increase in her lymphedema in the dorsal foot and a proximal calf. Fortunately that does not seem to have resulted in any deterioration in her wounds The patient is going to need compression stockings. We have given her measurements to phone elastic therapy in [place]. She has vascular surgery consult on December 26 02/07/18; she's had continuous contraction on the left lateral leg wound which is now very small. Currently using silver alginate under 3 layer compression She saw Dr. Trula Slade of vascular surgery on 02/06/18. It was noted that she had a normal reflux times in the popliteal vein, great saphenous vein at the saphenofemoral junction, great saphenous vein at the proximal thigh great saphenous vein at the mid calf and origin of the small saphenous vein. It was noted that she had significant reflux in the left saphenous veins with diameter measurements in the 0.7-0.8 cm range. It was felt she would benefit from laser ablation to help minimize the risk of ulcer recurrence. It was recommended that she wear  20-30 thigh-high compression stockings and have follow-up in 4-6 weeks 02/17/2018; I thought this lady would be healed however her compression slipped down and she developed increasing swelling and the wound is actually larger. 1/13; we had deterioration last week after the patient's compression slipped down and she developed periwound swelling. I increased her compression before layers we have been using silver alginate we are a lot better again today. She has her compression stockings in waiting 1/23; the patient's wounds are totally healed today. She has her stockings. This was almost circumferential skin damage. She follows up with Dr. Trula Slade of vascular surgery next Monday. READMISSION 02/21/2021 This is a now 67 year old woman that we had in clinic here discharging in January 2020 with wounds on her left calf chronic venous insufficiency. She was discharged with 30/40 stockings. It does not sound like she has worn stockings in about a year largely from not being able to get them on herself. In November she developed new blisters on her legs an area laterally is opened into a fairly sizable wound. She has weeping posteriorly as well. She has been using Neosporin and Band-Aids. The patient did see Dr. Trula Slade in 2019 and 2020. He felt she might benefit from laser ablation in her left saphenous veins although  because of the wound that healed I do not think he went through with it. She might benefit from seeing him again. Her ABI on the left is 1. 1/17; patient's wound on the posterior left calf is closed she has the 2 large areas last week. We put her in 4-layer compression for edema control is a lot better. Our intake nurse noted greenish drainage and odor. We have been using silver alginate 1/25; PCR culture I did have the substantial wound area on the left lateral lower leg showed staff aureus and group A strep. Low titers of coag negative staph which are probably skin contaminants. Resistance  detected to tetracycline methicillin and macrolides. I gave her a starter kit of Nuzyra 150 mg x 3 for 2 days then 300 mg for a further 7 days. Marked odor considerable increase in surrounding erythema. We are using Iodoflex last week however I have changed to silver alginate with underlying Bactroban 2/1; she is completing her [place] tomorrow. The degree of erythema around the wounds looks a lot better. Odor has improved. I gave her silver alginate and Bactroban last week and changing her back to Iodoflex to continue with ongoing debridement 2/8; the periwound looks a lot better. Surface of the wound also looks somewhat better although there is still ongoing debridement to be done we have been using Iodoflex under compression. Primary dressing and silver alginate 2/15; left posterior calf. Some improvement in the surface of the wound but still very gritty we have been using Iodoflex under compression. 04/05/2021: Left lateral posterior calf. She continues to have significant amounts of drainage, some of which is probably comprised of the Iodoflex microbleeds. There is no odor to the drainage. The satellite lesion on the more posterior aspect of the calf is epithelializing nicely and has contracted quite a bit. There is robust granulation tissue in the dominant wound with minimal adherent slough. 04/12/2021: The satellite lesion has nearly closed. She continues to have good granulation tissue with minimal slough of the larger primary wound. She continues to have a fair amount of drainage, but I think this is less secondary to switching her dressing from Iodoflex to Prisma. 04/19/2021: The satellite lesion is almost completely epithelialized. The larger primary wound continues to contract with good granulation tissue and minimal slough. She is currently in [place] with compression. 04/26/2021: The satellite lesion has closed. The larger primary wound has contracted further and the granulation tissue is robust  without being hypertrophic. Minimal slough is present. 05/03/2021: The satellite lesion remains closed. The larger primary wound has a bit of slough, but has also contracted further with good perimeter epithelialization. Good granulation tissue at the wound surface. There was some greenish drainage on the dressing when it was removed; it is not the blue-green typically associated with Pseudomonas aeruginosa, however. [name], [name] ([id number]) [id number] Page 8 of 12 05/10/2021: The primary wound continues to contract. There is minimal slough. The granulation tissue at 9:00 is a bit hypertrophic. No significant drainage. No odor. 05/17/2021: The wound is a little bit smaller today with good granulation tissue. Minimal slough. No significant drainage or odor. 05/24/2021: The wound continues to contract and has a nice base of granulation tissue. Small amount of slough. No concern for infection. 05/31/2021: For some reason, the wound measured slightly larger today but overall it still appears to be in good condition with a nice base of granulation tissue and minimal slough. 06/07/2021: The wound is smaller today. Good granulation tissue and minimal slough. 06/14/2021:  The wound is unchanged in size. She continues to accumulate some slough. Good granulation tissue on the surface. 06/20/2021: The wound is smaller today. Minimal slough with good granulation tissue. 06/27/2021: The wound on her left lateral leg is smaller today with just a bit of slough and eschar accumulation. Unfortunately, she has opened a new superficial wound on her right lower extremity. She has 3+ pitting edema to the knees on that leg and she does not wear compression stockings. 07/04/2021: In addition to the superficial wound on her right lower extremity that she opened up last week, she has opened 3 additional sites on the same leg. Her compression wraps were clearly not in correct position when she came to  clinic today as they were at about the mid calf level rather than to the tibial tuberosity. She says that they slipped earlier and she had to come back on Friday to have them redone. The wound on her left lateral leg is perhaps slightly larger with some slough accumulation. 07/11/2021: The superficial wounds on her right lower extremity are nearly closed. They just have a thin layer of eschar overlying them. The wound on her left lateral leg is a little bit shallower and has a bit of slough accumulation. 07/17/2021: The right medial lower extremity leg wounds have almost completely closed; one of them just has a tiny opening with a little bit of serous drainage. The left lateral leg wound is really unchanged. It seems to be stalled. 07/24/2021: The right leg wounds are completely closed. The left lateral leg wound is actually bigger today. It is a bit more tender. There is a minor accumulation of slough on the surface. 07/31/2021: The culture that I took last week was positive for MRSA. We added mupirocin under the silver alginate and I prescribed doxycycline. She has been tolerating this well. The wound is smaller today but does have slough accumulation. No significant pain or drainage. 08/07/2021: She completed her course of doxycycline. The wound looks much better today. It is smaller and the periwound is less inflamed. She does have some slough accumulation on the surface. 08/14/2021: The wound continues to contract. There is slough on the wound surface, but the periwound is intact without inflammation or induration. 08/21/2021: The wound is down to just 2 small open sites. They both have a bit of slough accumulation. 08/28/2021: The wound is down to just 1 small open site with a little bit of slough and eschar accumulation. 09/04/2021: The wound continues to contract but remains open. It is clean without any slough. 09/12/2021: No real change in the overall wound dimensions, but it is flush with the  surrounding skin. There is a little bit of slough accumulation. Edema control is good. 09/22/2021: The wound is smaller and even more superficial. Minimal slough accumulation. Good control of edema. 09/29/2021: The wound continues to contract. She reports that it was a little bit "twingey" during the week. It is a little bit dry on inspection today. Light accumulation of slough. Good edema control. 10/06/2021: The wound is about the same size today. There is a little slough on the surface. Edema control is good. 10/13/2021: The wound is about the same size but more superficial. A little bit of slough on the surface. 10/23/2021: The wound is slightly smaller and continues to fill in. Minimal slough on the wound surface. 10/30/2021: The wound continues to contract but has not yet closed. Light slough and eschar present. 11/06/2021: The wound persists, but seems a little bit  more epithelialized. There is still slough and eschar accumulation. 11/14/2021: The wound continues to contract but persists. Light eschar around the wound. 11/22/2021: The wound is down to just a pinhole opening. There is eschar overlying the surface. Edema control is excellent. 11/29/2021: Her wound is closed. READMISSION 01/22/2022 This is a patient to be discharged in October. She has severe chronic venous insufficiency at that time she had a wound on her left lower leg posteriorly also the right leg at some point. These closed. We discharged her in 30/40 mm stockings from elastic therapy which she has been wearing religiously. She tells [name] that she was traumatized by a dolly while shopping at [place] about 2 to 3 weeks ago. She has been left with a left anterior lower leg wound. She comes in in another pair of stockings other than her 3040s. She does not have an arterial issue with her last ABI in the left at 1.2 12/18; this is a patient who has chronic venous insufficiency and recurrent venous insufficiency ulcers. She has a area on  her left anterior lower leg and we readmitted her to the clinic last week. We use silver alginate and 4-layer compression. ABI was 1.2 She brought in her stocking which was a 20/30 below-knee stocking from elastic therapy. She may require a 30/40 mm equivalent stockings after this wound heals. 02/06/2022: The intake nurse reported an odor coming from the wound when she first unwrapped it but this abated after the leg was washed. There is thick layer of slough on the surface. 02/13/2022: The wound is cleaner today. It measured larger, but on visual inspection, I think some crust of Iodoflex was included in the wound measurements; the actual wound itself looks about the same to slightly smaller. Edema control is good. 02/20/2022: The wound is cleaner again today. It is also measuring a little bit smaller, but there has been some moisture related periwound breakdown. Edema KEIRSTEN, HAMANN ([id number]) [phone].pdf Page 9 of 12 control is good. 02/27/2022: The intake nurse reported that the wound measures larger, but some of this ended up being crusted on Iodoflex. There is still some periwound moisture. Still with thick slough accumulation. Edema control is good. 03/06/2022: The wound is cleaner and more superficial, but did measure larger because of the inclusion of a satellite area. Still with some slough accumulation but less so than on prior visits. 03/13/2022: The wound measured a little bit smaller today. There is slough and eschar accumulation, as per usual. 03/20/2022: Her wound is larger and deeper today. The entire surface appears nonviable. There is more periwound erythema and threatened tissue breakdown. 03/27/2022: The culture that I took last week was positive for MRSA. We had empirically applied topical mupirocin to her wound. Today the wound is smaller and cleaner and less painful. There is still a layer of slough on the surface. 04/03/2022: The wound was measured  slightly larger today, but appears roughly the same to my eye. There is a layer of slough on the surface. Underneath, the tissue is quite fibrotic. 04/10/2022: The wound is slightly smaller today. There is slough and eschar accumulation. For some reason, her wrap got changed from 4 layers to 3 layers and her edema control is not as good, as a result. 04/17/2022: The return to true 4-layer compression has improved the wound considerably. It is much smaller and cleaner today. Edema control is excellent. Patient History Information obtained from Patient. Family History Heart Disease - Mother,Father, Hypertension - Mother,Father, Kidney Disease -  Mother, Thyroid Problems - Mother, No family history of Cancer, Diabetes, Hereditary Spherocytosis, Lung Disease, Seizures, Stroke, Tuberculosis. Social History Never smoker, Marital Status - Single, Alcohol Use - Never, Drug Use - No History, Caffeine Use - Daily - coffee. Medical History Eyes Patient has history of Cataracts Hematologic/Lymphatic Patient has history of Lymphedema Cardiovascular Patient has history of Hypertension, Peripheral Venous Disease Integumentary (Skin) Denies history of History of Burn Musculoskeletal Patient has history of Osteoarthritis Hospitalization/Surgery History - colostomy reversal. 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 Medical A Surgical History Notes nd Constitutional Symptoms (General Health) morbid obesity Cardiovascular schamberg disease, hyperlipidemia Gastrointestinal diverticulitis , h/o obstruction due to diverticulitis , colostomy and colostomy reversal Endocrine hypothyroidism Objective Constitutional no acute distress. Vitals Time Taken: 9:45 AM, Height: 62 in, Weight: 222 lbs, BMI: 40.6, Temperature: 98.3 F, Pulse: 75 bpm, Respiratory Rate: 18 breaths/min, Blood Pressure: 134/83 mmHg. Respiratory Normal work of breathing on room air. General Notes: 04/17/2022: The return to true 4-layer  compression has improved the wound considerably. It is much smaller and cleaner today. Edema control is excellent. Integumentary (Hair, Skin) Wound #7 status is Open. Original cause of wound was Blister. The date acquired was: 01/22/2022. The wound has been in treatment 12 weeks. The wound is located on the Left,Anterior Lower Leg. The wound measures 1cm length x 1.1cm width x 0.2cm depth; 0.864cm^2 area and 0.173cm^3 volume. There is Fat Layer (Subcutaneous Tissue) exposed. There is no tunneling or undermining noted. There is a medium amount of serosanguineous drainage noted. The wound margin is distinct with the outline attached to the wound base. There is large (67-100%) pink granulation within the wound bed. There is a small (1-33%) amount of necrotic tissue within the wound bed including Adherent Slough. The periwound skin appearance had no abnormalities noted for texture. The periwound skin appearance exhibited: Dry/Scaly, Maceration, Hemosiderin Staining. The periwound skin appearance did not exhibit: Erythema. Periwound temperature was MARINEL, PAVEL ([id number]) [phone].pdf Page 10 of 12 noted as No Abnormality. 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 There was a Excisional Skin/Subcutaneous Tissue Debridement with a total area of 1.1 sq cm performed by [name], MD. With the following instrument(s): Curette to remove Non-Viable tissue/material. Material removed includes Subcutaneous Tissue and Slough and after achieving pain control using Lidocaine 4% T opical Solution. No specimens were taken. A time out was conducted at 10:20, prior to the start of the procedure. A Minimum  amount of bleeding was controlled with Pressure. The procedure was tolerated well with a pain level of 0 throughout and a pain level of 0 following the procedure. Post Debridement Measurements: 1cm length x 1.1cm width x 0.2cm depth; 0.173cm^3 volume. Character of Wound/Ulcer Post Debridement is improved. Post procedure Diagnosis Wound #7: Same as Pre-Procedure General Notes: Scribed for Dr. Celine Ahr by J.[name]. Plan Follow-up Appointments: Return Appointment in 1 week. - Dr. Celine Ahr Room 3 Anesthetic: (In clinic) Topical Lidocaine 5% applied to wound bed Bathing/ Shower/ Hygiene: May shower with protection but do not get wound dressing(s) wet. Protect dressing(s) with water repellant cover (for example, large plastic bag) or a cast cover and may then take shower. - Please do not get the Left lower leg Compression wraps wet. Edema Control - Lymphedema / SCD / Other: Avoid standing for  long periods of time. Patient to wear own compression stockings every day. Exercise regularly Moisturize legs daily. WOUND #7: - Lower Leg Wound Laterality: Left, Anterior Cleanser: Soap and Water 1 x Per Week/30 Days Discharge Instructions: May shower and wash wound with dial antibacterial soap and water prior to dressing change. Cleanser: Wound Cleanser 1 x Per Week/30 Days Discharge Instructions: Cleanse the wound with wound cleanser prior to applying a clean dressing using gauze sponges, not tissue or cotton balls. Peri-Wound Care: Ketoconazole Cream 2% 1 x Per Week/30 Days Discharge Instructions: Apply Ketoconazole as directed Peri-Wound Care: Triamcinolone 15 (g) 1 x Per Week/30 Days Discharge Instructions: Use triamcinolone 15 (g) as directed Peri-Wound Care: Zinc Oxide Ointment 30g tube 1 x Per Week/30 Days Discharge Instructions: Apply Zinc Oxide to periwound with each dressing change Peri-Wound Care: Sween Lotion (Moisturizing lotion) 1 x Per Week/30 Days Discharge Instructions: Apply  moisturizing lotion as directed Topical: Mupirocin Ointment 1 x Per Week/30 Days Discharge Instructions: Apply Mupirocin (Bactroban) as instructed Prim Dressing: Sorbalgon AG Dressing, 4x4 (in/in) 1 x Per Week/30 Days ary Discharge Instructions: Apply to wound bed as instructed Secondary Dressing: Woven Gauze Sponge, Non-Sterile 4x4 in 1 x Per Week/30 Days Discharge Instructions: Apply over primary dressing as directed. Secondary Dressing: Zetuvit Plus 4x8 in 1 x Per Week/30 Days Discharge Instructions: Apply over primary dressing as directed. Com pression Wrap: FourPress (4 layer compression wrap) 1 x Per Week/30 Days Discharge Instructions: Apply four layer compression as directed. May also use Urgo K2 compression system as alternative. Com pression Wrap: Netting #5 1 x Per Week/30 Days 04/17/2022: The return to true 4-layer compression has improved the wound considerably. It is much smaller and cleaner today. Edema control is excellent. I used a curette to debride slough and subcutaneous tissue from the wound. We will continue topical mupirocin, silver alginate, and 4-layer compression. Continue the combination of ketoconazole, zinc oxide, and triamcinolone to the periwound. Follow-up in 1 week. Electronic Signature(s) Signed: 04/17/2022 10:42:32 AM By: [name] MD FACS DAIYA, PRUITTE D ([id number]) AM By: [name] MD FACS [phone].pdf Page 11 of 12 Signed: 04/17/2022 10:42:32 Entered By: [name] on 04/17/2022 10:42:31 -------------------------------------------------------------------------------- HxROS Details Patient Name: Date of Service: [name], [name]. 04/17/2022 9:30 A M Medical Record Number: [id number] Patient Account Number: [id number] Date of Birth/Sex: Treating RN: January 18, 1956 (67 y.o. F) Primary Care Provider: [name] Other Clinician: Referring Provider: Treating Provider/Extender: [name] in Treatment: 12 Information Obtained From Patient Constitutional Symptoms (General Health) Medical History: Past Medical History Notes: morbid obesity Eyes Medical History: Positive for: Cataracts Hematologic/Lymphatic Medical History: Positive for: Lymphedema Cardiovascular Medical History: Positive for: Hypertension; Peripheral Venous Disease Past Medical History Notes: schamberg disease, hyperlipidemia Gastrointestinal Medical History: Past Medical History Notes: diverticulitis , h/o obstruction due to diverticulitis , colostomy and colostomy reversal Endocrine Medical History: Past Medical History Notes: hypothyroidism Integumentary (Skin) Medical History: Negative for: History of Burn Musculoskeletal Medical History: Positive for: Osteoarthritis HBO Extended History Items Eyes: Cataracts Immunizations Pneumococcal Vaccine: Received Pneumococcal Vaccination: Yes Received Pneumococcal Vaccination On or After 60th Birthday: Yes Implantable Devices None Hospitalization / Surgery History KESI, BRANK ([id number]) [id number] Page 12 of 12 Type of Hospitalization/Surgery colostomy reversal 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 Family and Social History Cancer: No; Diabetes: No; Heart Disease: Yes - Mother,Father; Hereditary Spherocytosis: No; Hypertension: Yes - Mother,Father; Kidney Disease: Yes - Mother; Lung Disease: No; Seizures: No; Stroke: No; Thyroid Problems: Yes - Mother; Tuberculosis: No; Never smoker; Marital  Status - Single; Alcohol Use: Never; Drug Use: No History; Caffeine Use: Daily - coffee; Financial Concerns: No; Food, Clothing or Shelter Needs: No; Support System Lacking: No; Transportation Concerns: No Electronic Signature(s) Signed: 04/17/2022 10:49:49 AM By: [name] MD FACS Entered By: [name] on 04/17/2022  10:38:47 -------------------------------------------------------------------------------- SuperBill Details Patient Name: Date of Service: Erin Gerold [name] 04/17/2022 Medical Record Number: [id number] Patient Account Number: [id number] Date of Birth/Sex: Treating RN: Nov 05, 1955 (67 y.o. 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 with other spec Modifier: ified severity Quantity: 1 Physician Procedures : CPT4 Code Description Modifier [id number] - WC PHYS LEVEL 3 - EST PT 25 ICD-10 Diagnosis Description L97.928 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

## 2022-04-19 NOTE — Progress Notes (Signed)
[name], [name] ([id number]) [id number] Page 1 of 7 Visit Report for 04/17/2022 Arrival Information Details Patient Name: Date of Service: [name], [name] 04/17/2022 9:30 A M Medical Record Number: [id number] Patient Account Number: [id number] Date of Birth/Sex: Treating RN: 1955-08-17 (67 y.o. [name] Primary Care Bobetta Korf: [name] Other Clinician: Referring Wilfrido Luedke: Treating Dalonte Hardage/Extender: [name] in Treatment: 12 Visit Information History Since Last Visit Added or deleted any medications: No Patient Arrived: [name] Any new allergies or adverse reactions: No Arrival Time: 09:44 Had a fall or experienced change in No Accompanied By: self activities of daily living that may affect Transfer Assistance: None risk of falls: Patient Requires Transmission-Based Precautions: No Signs or symptoms of abuse/neglect since last visito No Patient Has Alerts: No Hospitalized since last visit: No Implantable device outside of the clinic excluding No cellular tissue based products placed in the center since last visit: Has Dressing in Place as Prescribed: Yes Has Compression in Place as Prescribed: Yes Pain Present Now: No Electronic Signature(s) Signed: 04/17/2022 5:29:37 PM By: [name] RN Entered By: [name] on 04/17/2022 09:58:38 -------------------------------------------------------------------------------- Encounter Discharge Information Details Patient Name: Date of Service: Erin Gerold NITA [name] 04/17/2022 9:30 A M Medical Record Number: [id number] Patient Account Number: [id number] Date of Birth/Sex: Treating RN: [birth year] (67 y.o. [name] Primary Care Georganna Maxson: [name] Other Clinician: Referring Rhealyn Cullen: Treating Alfredo Collymore/Extender: [name] in Treatment: 12 Encounter Discharge Information Items Post Procedure Vitals Discharge Condition:  Stable Temperature (F): 98.3 Ambulatory Status: Cane Pulse (bpm): 75 Discharge Destination: Home Respiratory Rate (breaths/min): 18 Transportation: Private Auto Blood Pressure (mmHg): 134/83 Accompanied By: self Schedule Follow-up Appointment: Yes Clinical Summary of Care: Patient Declined Electronic Signature(s) Signed: 04/17/2022 5:29:37 PM By: [name] RN Entered By: [name] on 04/17/2022 16:09:35 -------------------------------------------------------------------------------- Lower Extremity Assessment Details Patient Name: Date of Service: [name], [name] 04/17/2022 9:30 A M Medical Record Number: [id number] Patient Account Number: [id number] Date of Birth/Sex: Treating RN: 1955-04-20 (67 y.o. [name] Primary Care Deyonte Cadden: [name] Other Clinician: Referring Geriann Lafont: Treating Marquise Wicke/Extender: [name] in Treatment: 12 Edema Assessment R[Left: ANDRIEL, LEONHART [id number] Patrice [id number].pdf Page 2 of 7] Assessed: [Left: No] [Right: No] [Left: Edema] [Right: :] Calf Left: Right: Point of Measurement: From Medial Instep 36.8 cm Ankle Left: Right: Point of Measurement: From Medial Instep 22 cm Vascular Assessment Pulses: Dorsalis Pedis Palpable: [Left:Yes] Electronic Signature(s) Signed: 04/17/2022 5:29:37 PM By: [name] RN Entered By: [name] on 04/17/2022 10:01:08 -------------------------------------------------------------------------------- Multi Wound Chart Details Patient Name: Date of Service: Erin Face [name] 04/17/2022 9:30 A M Medical Record Number: [id number] Patient Account Number: [id number] Date of Birth/Sex: Treating RN: 06-28-1955 (67 y.o. F) Primary Care Taegan Haider: [name] Other Clinician: Referring Shacora Zynda: Treating Henlee Donovan/Extender: [name] in Treatment: 12 Vital Signs Height(in): 62 Pulse(bpm):  75 Weight(lbs): 222 Blood Pressure(mmHg): 134/83 Body Mass Index(BMI): 40.6 Temperature(F): 98.3 Respiratory Rate(breaths/min): 18 [7:Photos:] [N/A:N/A] Left, Anterior Lower Leg N/A N/A Wound Location: Blister N/A N/A Wounding Event: Lymphedema N/A N/A Primary Etiology: Cataracts, Lymphedema, N/A N/A Comorbid History: Hypertension, Peripheral Venous Disease, Osteoarthritis 01/22/2022 N/A N/A Date Acquired: 12 N/A N/A Weeks of Treatment: Open N/A N/A Wound Status: No N/A N/A Wound Recurrence: 1x1.1x0.2 N/A N/A Measurements L x W x [name] 0.864 N/A N/A A (cm) : rea 0.173 N/A N/A Volume (cm) : 72.20% N/A N/A % Reduction in Area: 72.20% N/A N/A % Reduction in  Volume: Full Thickness Without Exposed N/A N/A Classification: Support Structures Medium N/A N/A Exudate Amount: Serosanguineous N/A N/A Exudate Type: red, brown N/A N/A Exudate Color: Distinct, outline attached N/A N/A Wound Margin: Large (67-100%) N/A N/A Granulation Amount: Pink N/A N/A Granulation Quality: Small (1-33%) N/A N/A Necrotic Amount: [name], [name] ([id number]) [id number] Page 3 of 7 Fat Layer (Subcutaneous Tissue): Yes N/A N/A Exposed Structures: Fascia: No Tendon: No Muscle: No Joint: No Bone: No Small (1-33%) N/A N/A Epithelialization: Debridement - Excisional N/A N/A Debridement: Pre-procedure Verification/Time Out 10:20 N/A N/A Taken: Lidocaine 4% Topical Solution N/A N/A Pain Control: Subcutaneous, Slough N/A N/A Tissue Debrided: Skin/Subcutaneous Tissue N/A N/A Level: 1.1 N/A N/A Debridement A (sq cm): rea Curette N/A N/A Instrument: Minimum N/A N/A Bleeding: Pressure N/A N/A Hemostasis A chieved: 0 N/A N/A Procedural Pain: 0 N/A N/A Post Procedural Pain: Procedure was tolerated well N/A N/A Debridement Treatment Response: 1x1.1x0.2 N/A N/A Post Debridement Measurements L x W x [name] 0.173 N/A N/A Post Debridement  Volume: (cm) No Abnormalities Noted N/A N/A Periwound Skin Texture: Maceration: Yes N/A N/A Periwound Skin Moisture: Dry/Scaly: Yes Hemosiderin Staining: Yes N/A N/A Periwound Skin Color: Erythema: No No Abnormality N/A N/A Temperature: Debridement N/A N/A Procedures Performed: Treatment Notes Electronic Signature(s) Signed: 04/17/2022 10:37:55 AM By: [name] MD FACS Entered By: [name] on 04/17/2022 10:37:55 -------------------------------------------------------------------------------- Multi-Disciplinary Care Plan Details Patient Name: Date of Service: Erin Face [name] 04/17/2022 9:30 A M Medical Record Number: [id number] Patient Account Number: [id number] Date of Birth/Sex: Treating RN: Mar 12, 1955 (67 y.o. [name] Primary Care Jozalynn Noyce: [name] Other Clinician: Referring Vyncent Overby: Treating Eleonore Shippee/Extender: [name] in Treatment: 12 Active Inactive Abuse / Safety / Falls / Self Care Management Nursing Diagnoses: History of Falls Impaired physical mobility Goals: Patient/caregiver will identify factors that restrict self-care and home management Date Initiated: 01/22/2022 Target Resolution Date: 06/12/2022 Goal Status: Active Interventions: Assess fall risk on admission and as needed Assess: immobility, friction, shearing, incontinence upon admission and as needed Notes: Wound/Skin Impairment Nursing Diagnoses: Impaired tissue integrity Knowledge deficit related to ulceration/compromised skin integrity Goals: Patient/caregiver will verbalize understanding of skin care regimen [name], [name] ([id number]) [id number] Page 4 of 7 Date Initiated: 01/22/2022 Target Resolution Date: 06/12/2022 Goal Status: Active Interventions: Assess ulceration(s) every visit Treatment Activities: Skin care regimen initiated : 01/22/2022 Topical wound management initiated :  01/22/2022 Notes: Electronic Signature(s) Signed: 04/17/2022 5:29:37 PM By: [name] RN Entered By: [name] on 04/17/2022 10:18:30 -------------------------------------------------------------------------------- Pain Assessment Details Patient Name: Date of Service: Erin Face [name] 04/17/2022 9:30 A M Medical Record Number: [id number] Patient Account Number: [id number] Date of Birth/Sex: Treating RN: [birth year] (66 y.o. [name] Primary Care Rainier Feuerborn: [name] Other Clinician: Referring Delrick Dehart: Treating Giles Currie/Extender: [name] in Treatment: 12 Active Problems Location of Pain Severity and Description of Pain Patient Has Paino No Site Locations Pain Management and Medication Current Pain Management: Electronic Signature(s) Signed: 04/17/2022 5:29:37 PM By: [name] RN Entered By: [name] on 04/17/2022 10:00:53 -------------------------------------------------------------------------------- Patient/Caregiver Education Details Patient Name: Date of Service: [name] 3/5/2024andnbsp9:30 [place] Record Number: [id number] Patient Account Number: [id number] Date of Birth/Gender: Treating RN: [birth year] (67 y.o. [name] Primary Care Physician: [name] Other Clinician: Referring Physician: Treating Physician/Extender: [name] in Treatment: [address] [name], [name] ([id number]) [id number] Page 5 of 7 Education Provided To: Patient Education Topics Provided Wound/Skin Impairment: Methods:  Explain/Verbal Responses: Return demonstration correctly Electronic Signature(s) Signed: 04/17/2022 5:29:37 PM By: [name] RN Entered By: [name] on 04/17/2022 10:18:43 -------------------------------------------------------------------------------- Wound Assessment Details Patient Name: Date of  Service: Erin Face [name] 04/17/2022 9:30 A M Medical Record Number: [id number] Patient Account Number: [id number] Date of Birth/Sex: Treating RN: 10-21-1955 (67 y.o. [name] Primary Care Killian Ress: [name] Other Clinician: Referring Hamdan Toscano: Treating Maybel Dambrosio/Extender: [name] in Treatment: 12 Wound Status Wound Number: 7 Primary Lymphedema Etiology: Wound Location: Left, Anterior Lower Leg Wound Open Wounding Event: Blister Status: Date Acquired: 01/22/2022 Comorbid Cataracts, Lymphedema, Hypertension, Peripheral Venous Weeks Of Treatment: 12 History: Disease, Osteoarthritis Clustered Wound: No Photos Wound Measurements Length: (cm) 1 Width: (cm) 1.1 Depth: (cm) 0.2 Area: (cm) 0.864 Volume: (cm) 0.173 % Reduction in Area: 72.2% % Reduction in Volume: 72.2% Epithelialization: Small (1-33%) Tunneling: No Undermining: No Wound Description Classification: Full Thickness Without Exposed Support Structures Wound Margin: Distinct, outline attached Exudate Amount: Medium Exudate Type: Serosanguineous Exudate Color: red, brown Foul Odor After Cleansing: No Slough/Fibrino Yes Wound Bed Granulation Amount: Large (67-100%) Exposed Structure Granulation Quality: Pink Fascia Exposed: No Necrotic Amount: Small (1-33%) Fat Layer (Subcutaneous Tissue) Exposed: Yes Necrotic Quality: Adherent Slough Tendon Exposed: No Muscle Exposed: No Joint Exposed: No Bone Exposed: No [address] [name], [name] ([id number]) [id number] Page 6 of 7 No Abnormalities Noted: Yes No Abnormalities Noted: No Erythema: No Moisture Hemosiderin Staining: Yes No Abnormalities Noted: No Dry / Scaly: Yes Temperature / Pain Maceration: Yes Temperature: No Abnormality Treatment Notes Wound #7 (Lower Leg) Wound Laterality: Left, Anterior Cleanser Soap and Water Discharge Instruction: May shower  and wash wound with dial antibacterial soap and water prior to dressing change. Wound Cleanser Discharge Instruction: Cleanse the wound with wound cleanser prior to applying a clean dressing using gauze sponges, not tissue or cotton balls. Peri-Wound Care Ketoconazole Cream 2% Discharge Instruction: Apply Ketoconazole as directed Triamcinolone 15 (g) Discharge Instruction: Use triamcinolone 15 (g) as directed Zinc Oxide Ointment 30g tube Discharge Instruction: Apply Zinc Oxide to periwound with each dressing change Sween Lotion (Moisturizing lotion) Discharge Instruction: Apply moisturizing lotion as directed Topical Mupirocin Ointment Discharge Instruction: Apply Mupirocin (Bactroban) as instructed Primary Dressing Sorbalgon AG Dressing, 4x4 (in/in) Discharge Instruction: Apply to wound bed as instructed Secondary Dressing Woven Gauze Sponge, Non-Sterile 4x4 in Discharge Instruction: Apply over primary dressing as directed. Zetuvit Plus 4x8 in Discharge Instruction: Apply over primary dressing as directed. Secured With Compression Wrap FourPress (4 layer compression wrap) Discharge Instruction: Apply four layer compression as directed. May also use Urgo K2 compression system as alternative. Netting #5 Compression Stockings Add-Ons Electronic Signature(s) Signed: 04/17/2022 5:29:37 PM By: [name] RN Entered By: [name] on 04/17/2022 10:03:27 -------------------------------------------------------------------------------- Vitals Details Patient Name: Date of Service: Erin Gerold NITA [name] 04/17/2022 9:30 A M Medical Record Number: [id number] Patient Account Number: [id number] Date of Birth/Sex: Treating RN: 02/17/1955 (67 y.o. [name] Primary Care Tyrea Froberg: [name] Other Clinician: Referring Brenee Gajda: Treating Usher Hedberg/Extender: [name] in Treatment: 12 Vital Signs Time Taken: 09:45 Temperature (F): 98.3 Height  (in): 62 Pulse (bpm): 75 Weight (lbs): 222 Respiratory Rate (breaths/min): 18 Body Mass Index (BMI): 40.6 Blood Pressure (mmHg): 134/83 [name], [name] ([id number]) [phone].pdf Page 7 of 7 Reference Range: 80 - 120 mg / dl Electronic Signature(s) Signed: 04/17/2022 5:29:37 PM By: [name] RN Entered By: [name] on 04/17/2022 10:00:45

## 2022-04-24 ENCOUNTER — Encounter (HOSPITAL_BASED_OUTPATIENT_CLINIC_OR_DEPARTMENT_OTHER): Payer: Commercial Managed Care - PPO | Admitting: General Surgery

## 2022-04-24 DIAGNOSIS — E785 Hyperlipidemia, unspecified: Secondary | ICD-10-CM | POA: Diagnosis not present

## 2022-04-24 DIAGNOSIS — L97928 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: Secondary | ICD-10-CM | POA: Diagnosis not present

## 2022-04-24 DIAGNOSIS — I872 Venous insufficiency (chronic) (peripheral): Secondary | ICD-10-CM | POA: Diagnosis not present

## 2022-04-24 DIAGNOSIS — I739 Peripheral vascular disease, unspecified: Secondary | ICD-10-CM | POA: Diagnosis not present

## 2022-04-24 DIAGNOSIS — E039 Hypothyroidism, unspecified: Secondary | ICD-10-CM | POA: Diagnosis not present

## 2022-04-24 DIAGNOSIS — I10 Essential (primary) hypertension: Secondary | ICD-10-CM | POA: Diagnosis not present

## 2022-04-24 DIAGNOSIS — M1990 Unspecified osteoarthritis, unspecified site: Secondary | ICD-10-CM | POA: Diagnosis not present

## 2022-04-24 DIAGNOSIS — I890 Lymphedema, not elsewhere classified: Secondary | ICD-10-CM | POA: Diagnosis not present

## 2022-04-25 NOTE — Progress Notes (Signed)
Erin, [name] ([id number]) [id number] Page 1 of 7 Visit Report for 04/24/2022 Arrival Information Details Patient Name: Date of Service: [name], [name] 04/24/2022 9:30 A M Medical Record Number: [id number] Patient Account Number: [id number] Date of Birth/Sex: Treating RN: 06-03-1955 (67 y.o. [name] Primary Care [name]: [name] Other Clinician: Referring [name]: Treating [name]/Extender: [name] in Treatment: 15 Visit Information History Since Last Visit Added or deleted any medications: No Patient Arrived: [name] Any new allergies or adverse reactions: No Arrival Time: 09:37 Had a fall or experienced change in No Accompanied By: self activities of daily living that may affect Transfer Assistance: None risk of falls: Patient Identification Verified: Yes Signs or symptoms of abuse/neglect since last visito No Patient Requires Transmission-Based Precautions: No Hospitalized since last visit: No Patient Has Alerts: No Implantable device outside of the clinic excluding No cellular tissue based products placed in the center since last visit: Has Dressing in Place as Prescribed: Yes Has Compression in Place as Prescribed: Yes Pain Present Now: No Electronic Signature(s) Signed: 04/24/2022 4:50:01 PM By: Erin Catholic RN Entered By: [name] on 04/24/2022 09:54:51 -------------------------------------------------------------------------------- Compression Therapy Details Patient Name: Date of Service: [name] 04/24/2022 9:30 A M Medical Record Number: [id number] Patient Account Number: [id number] Date of Birth/Sex: Treating RN: 05-06-1955 (67 y.o. [name] Primary Care [name]: [name] Other Clinician: Referring [name]: Treating [name]/Extender: [name] in Treatment: 13 Compression Therapy Performed for Wound Assessment:  Wound #7 Left,Anterior Lower Leg Performed By: Clinician Erin Catholic, RN Compression Type: Four Layer Post Procedure Diagnosis Same as Pre-procedure Electronic Signature(s) Signed: 04/24/2022 4:50:01 PM By: Erin Catholic RN Entered By: [name] on 04/24/2022 10:34:48 -------------------------------------------------------------------------------- Encounter Discharge Information Details Patient Name: Date of Service: [name] 04/24/2022 9:30 A M Medical Record Number: [id number] Patient Account Number: [id number] Date of Birth/Sex: Treating RN: Sep 07, 1955 (67 y.o. [name] Primary Care Ronnett Pullin: [name] Other Clinician: Referring Siarah Deleo: Treating [name]/Extender: [name] in Treatment: 13 Encounter Discharge Information Items Post Procedure Vitals Discharge Condition: Stable Temperature (F): 98.2 Ambulatory Status: Cane Pulse (bpm): 73 Discharge Destination: Home Respiratory Rate (breaths/min): 16 Transportation: Private Auto Blood Pressure (mmHg): 116/75 Erin, [name] ([id number]) [id number] Page 2 of 7 Accompanied By: self Schedule Follow-up Appointment: Yes Clinical Summary of Care: Patient Declined Electronic Signature(s) Signed: 04/24/2022 4:50:01 PM By: Erin Catholic RN Entered By: [name] on 04/24/2022 16:48:46 -------------------------------------------------------------------------------- Lower Extremity Assessment Details Patient Name: Date of Service: [name], [name] 04/24/2022 9:30 A M Medical Record Number: [id number] Patient Account Number: [id number] Date of Birth/Sex: Treating RN: 08-Dec-1955 (67 y.o. [name] Primary Care [name] [name]: [name] Other Clinician: Referring [name]: Treating [name]/Extender: [name] in Treatment: 13 Edema Assessment Assessed: [Left: No] [Right: No] [Left: Edema] [Right:  :] Calf Left: Right: Point of Measurement: From Medial Instep 35.8 cm Ankle Left: Right: Point of Measurement: From Medial Instep 23.9 cm Vascular Assessment Pulses: Dorsalis Pedis Palpable: [Left:Yes] Electronic Signature(s) Signed: 04/24/2022 4:50:01 PM By: Erin Catholic RN Entered By: [name] on 04/24/2022 09:56:05 -------------------------------------------------------------------------------- Multi Wound Chart Details Patient Name: Date of Service: [name] 04/24/2022 9:30 A M Medical Record Number: [id number] Patient Account Number: [id number] Date of Birth/Sex: Treating RN: 1955-06-30 (67 y.o. F) Primary Care [name]: [name] Other Clinician: Referring [name]: Treating [name]/Extender: [name] in Treatment: 13 Vital Signs Height(in): 62 Pulse(bpm): 73 Weight(lbs):  222 Blood Pressure(mmHg): 116/75 Body Mass Index(BMI): 40.6 Temperature(F): 98.2 Respiratory Rate(breaths/min): 16 [7:Photos:] [N/A:N/A] Left, Anterior Lower Leg N/A N/A Wound Location: Blister N/A N/A Wounding Event: Lymphedema N/A N/A Primary Etiology: Cataracts, Lymphedema, N/A N/A Comorbid History: Hypertension, Peripheral Venous Disease, Osteoarthritis 01/22/2022 N/A N/A Date Acquired: 13 N/A N/A Weeks of Treatment: Open N/A N/A Wound Status: No N/A N/A Wound Recurrence: 0.9x1x0.2 N/A N/A Measurements L x W x [name] 0.707 N/A N/A A (cm) : rea 0.141 N/A N/A Volume (cm) : 77.30% N/A N/A % Reduction in A rea: 77.30% N/A N/A % Reduction in Volume: Full Thickness Without Exposed N/A N/A Classification: Support Structures Medium N/A N/A Exudate A mount: Serosanguineous N/A N/A Exudate Type: red, [name] N/A N/A Exudate Color: Distinct, outline attached N/A N/A Wound Margin: Large (67-100%) N/A N/A Granulation A mount: Pink N/A N/A Granulation Quality: Small (1-33%) N/A N/A Necrotic A mount: Fat Layer  (Subcutaneous Tissue): Yes N/A N/A Exposed Structures: Fascia: No Tendon: No Muscle: No Joint: No Bone: No Small (1-33%) N/A N/A Epithelialization: Debridement - Excisional N/A N/A Debridement: Pre-procedure Verification/Time Out 10:05 N/A N/A Taken: Lidocaine 5% topical ointment N/A N/A Pain Control: Subcutaneous, Slough N/A N/A Tissue Debrided: Skin/Subcutaneous Tissue N/A N/A Level: 0.9 N/A N/A Debridement A (sq cm): rea Curette N/A N/A Instrument: Minimum N/A N/A Bleeding: Pressure N/A N/A Hemostasis A chieved: 0 N/A N/A Procedural Pain: 0 N/A N/A Post Procedural Pain: Procedure was tolerated well N/A N/A Debridement Treatment Response: 0.9x1x0.2 N/A N/A Post Debridement Measurements L x W x [name] 0.141 N/A N/A Post Debridement Volume: (cm) No Abnormalities Noted N/A N/A Periwound Skin Texture: Maceration: Yes N/A N/A Periwound Skin Moisture: Dry/Scaly: Yes Hemosiderin Staining: Yes N/A N/A Periwound Skin Color: Erythema: No No Abnormality N/A N/A Temperature: Compression Therapy N/A N/A Procedures Performed: Debridement Treatment Notes Electronic Signature(s) Signed: 04/24/2022 10:52:30 AM By: [name] MD FACS Entered By: [name] on 04/24/2022 10:52:29 -------------------------------------------------------------------------------- Multi-Disciplinary Care Plan Details Patient Name: Date of Service: [name] 04/24/2022 9:30 A M Medical Record Number: [id number] Patient Account Number: [id number] Date of Birth/Sex: Treating RN: 22-Jan-1956 (67 y.o. [name] Primary Care Arra Connaughton: [name] Other Clinician: Referring Wilmary Levit: Treating Crestina Strike/Extender: [name] in Treatment: [address] [name], [name] ([id number]) [id number] Page 4 of 7 Abuse / Safety / Falls / Self Care Management Nursing Diagnoses: History of Falls Impaired physical  mobility Goals: Patient/caregiver will identify factors that restrict self-care and home management Date Initiated: 01/22/2022 Target Resolution Date: 06/12/2022 Goal Status: Active Interventions: Assess fall risk on admission and as needed Assess: immobility, friction, shearing, incontinence upon admission and as needed Notes: Wound/Skin Impairment Nursing Diagnoses: Impaired tissue integrity Knowledge deficit related to ulceration/compromised skin integrity Goals: Patient/caregiver will verbalize understanding of skin care regimen Date Initiated: 01/22/2022 Target Resolution Date: 06/12/2022 Goal Status: Active Interventions: Assess ulceration(s) every visit Treatment Activities: Skin care regimen initiated : 01/22/2022 Topical wound management initiated : 01/22/2022 Notes: Electronic Signature(s) Signed: 04/24/2022 4:50:01 PM By: Erin Catholic RN Entered By: [name] on 04/24/2022 16:47:21 -------------------------------------------------------------------------------- Pain Assessment Details Patient Name: Date of Service: [name], [name] 04/24/2022 9:30 A M Medical Record Number: [id number] Patient Account Number: [id number] Date of Birth/Sex: Treating RN: 1955/08/30 (67 y.o. [name] Primary Care Ignacio Lowder: [name] Other Clinician: Referring Million Maharaj: Treating Berel Najjar/Extender: [name] in Treatment: 13 Active Problems Location of Pain Severity and Description of Pain Patient Has Paino No Site Locations [place],  SAM HECTOR ([id number]) [id number] Page 5 of 7 Pain Management and Medication Current Pain Management: Electronic Signature(s) Signed: 04/24/2022 4:50:01 PM By: Erin Catholic RN Entered By: [name] on 04/24/2022 09:55:22 -------------------------------------------------------------------------------- Patient/Caregiver Education Details Patient Name: Date of  Service: [name] 3/12/2024andnbsp9:30 [place] Record Number: [id number] Patient Account Number: [id number] Date of Birth/Gender: Treating RN: Jul 21, 1955 (66 y.o. [name] Primary Care Physician: [name] Other Clinician: Referring Physician: Treating Physician/Extender: [name] in Treatment: 13 Education Assessment Education Provided To: Patient Education Topics Provided Wound/Skin Impairment: Methods: Explain/Verbal Responses: Return demonstration correctly Electronic Signature(s) Signed: 04/24/2022 4:50:01 PM By: Erin Catholic RN Entered By: [name] on 04/24/2022 16:47:38 -------------------------------------------------------------------------------- Wound Assessment Details Patient Name: Date of Service: [name], [name] 04/24/2022 9:30 A M Medical Record Number: [id number] Patient Account Number: [id number] Date of Birth/Sex: Treating RN: 04-20-1955 (67 y.o. [name] Primary Care Thaniel Coluccio: [name] Other Clinician: Referring Darol Cush: Treating Gor Vestal/Extender: [name] in Treatment: 13 Wound Status Wound Number: 7 Primary Lymphedema Etiology: Wound Location: Left, Anterior Lower Leg Wound Open Wounding Event: Blister Status: Date Acquired: 01/22/2022 Comorbid Cataracts, Lymphedema, Hypertension, Peripheral Venous Weeks Of Treatment: 13 History: Disease, Osteoarthritis Clustered Wound: No Photos Wound Measurements [name], [name] ([id number]) Length: (cm) 0.9 Width: (cm) 1 Depth: (cm) 0.2 Area: (cm) 0.707 Volume: (cm) 0.141 [id number] Page 6 of 7 % Reduction in Area: 77.3% % Reduction in Volume: 77.3% Epithelialization: Small (1-33%) Tunneling: No Undermining: No Wound Description Classification: Full Thickness Without Exposed Support Structures Wound Margin: Distinct, outline attached Exudate Amount:  Medium Exudate Type: Serosanguineous Exudate Color: red, [name] Foul Odor After Cleansing: No Slough/Fibrino Yes Wound Bed Granulation Amount: Large (67-100%) Exposed Structure Granulation Quality: Pink Fascia Exposed: No Necrotic Amount: Small (1-33%) Fat Layer (Subcutaneous Tissue) Exposed: Yes Necrotic Quality: Adherent Slough Tendon Exposed: No Muscle Exposed: No Joint Exposed: No Bone Exposed: No Periwound Skin Texture Texture Color No Abnormalities Noted: Yes No Abnormalities Noted: No Erythema: No Moisture Hemosiderin Staining: Yes No Abnormalities Noted: No Dry / Scaly: Yes Temperature / Pain Maceration: Yes Temperature: No Abnormality Treatment Notes Wound #7 (Lower Leg) Wound Laterality: Left, Anterior Cleanser Soap and Water Discharge Instruction: May shower and wash wound with dial antibacterial soap and water prior to dressing change. Wound Cleanser Discharge Instruction: Cleanse the wound with wound cleanser prior to applying a clean dressing using gauze sponges, not tissue or cotton balls. Peri-Wound Care Ketoconazole Cream 2% Discharge Instruction: Apply Ketoconazole as directed Triamcinolone 15 (g) Discharge Instruction: Use triamcinolone 15 (g) as directed Zinc Oxide Ointment 30g tube Discharge Instruction: Apply Zinc Oxide to periwound with each dressing change Sween Lotion (Moisturizing lotion) Discharge Instruction: Apply moisturizing lotion as directed Topical Mupirocin Ointment Discharge Instruction: Apply Mupirocin (Bactroban) as instructed Primary Dressing Sorbalgon AG Dressing, 4x4 (in/in) Discharge Instruction: Apply to wound bed as instructed Secondary Dressing Woven Gauze Sponge, Non-Sterile 4x4 in Discharge Instruction: Apply over primary dressing as directed. Zetuvit Plus 4x8 in Discharge Instruction: Apply over primary dressing as directed. Secured With Compression Wrap FourPress (4 layer compression wrap) Discharge Instruction:  Apply four layer compression as directed. May also use Urgo K2 compression system as alternative. Netting #5 Compression [id number] [name], [name] ([id number]) [id number] Page 7 of 7 Add-Ons Electronic Signature(s) Signed: 04/24/2022 4:50:01 PM By: Erin Catholic RN Entered By: [name] on 04/24/2022 10:01:55 -------------------------------------------------------------------------------- Vitals Details Patient Name: Date of Service: Erin Gerold NITA [name] 04/24/2022 9:30 A M Medical Record Number:  [id number] Patient Account Number: [id number] Date of Birth/Sex: Treating RN: [birth year] (67 y.o. [name] Primary Care Layloni Fahrner: [name] Other Clinician: Referring Milderd Manocchio: Treating Iliani Vejar/Extender: [name] in Treatment: 13 Vital Signs Time Taken: 09:39 Temperature (F): 98.2 Height (in): 62 Pulse (bpm): 73 Weight (lbs): 222 Respiratory Rate (breaths/min): 16 Body Mass Index (BMI): 40.6 Blood Pressure (mmHg): 116/75 Reference Range: 80 - 120 mg / dl Electronic Signature(s) Signed: 04/24/2022 4:50:01 PM By: Erin Catholic RN Entered By: [name] on 04/24/2022 09:55:15

## 2022-04-25 NOTE — Progress Notes (Signed)
DINEEN, TROJANOWSKI ([id number]) [id number] Page 1 of 12 Visit Report for 04/24/2022 Chief Complaint Document Details Patient Name: Date of Service: [name], [name] 04/24/2022 9:30 A M Medical Record Number: [id number] Patient Account Number: [id number] Date of Birth/Sex: Treating RN: October 24, 1955 (67 y.o. F) Primary Care Provider: [name] Other Clinician: Referring Provider: Treating Provider/Extender: [name] in Treatment: 13 Information Obtained from: Patient Chief Complaint 04/19/2021: The patient is here for ongoing follow-up regarding 2 left lower extremity wounds. 01/22/2022; patient returns to clinic with a wound on the left anterior lower leg secondary to trauma Electronic Signature(s) Signed: 04/24/2022 10:52:36 AM By: [name] MD FACS Entered By: [name] on 04/24/2022 10:52:36 -------------------------------------------------------------------------------- Debridement Details Patient Name: Date of Service: Erin Gerold [name]. 04/24/2022 9:30 A M Medical Record Number: [id number] Patient Account Number: [id number] Date of Birth/Sex: Treating RN: 1955-07-10 (67 y.o. [name] Primary Care Provider: [name] Other Clinician: Referring Provider: Treating Provider/Extender: [name] in Treatment: 13 Debridement Performed for Assessment: Wound #7 Left,Anterior Lower Leg Performed By: Physician [name], MD Debridement Type: Debridement Level of Consciousness (Pre-procedure): Awake and Alert Pre-procedure Verification/Time Out Yes - 10:05 Taken: Start Time: 10:05 Pain Control: Lidocaine 5% topical ointment T Area Debrided (L x W): otal 0.9 (cm) x 1 (cm) = 0.9 (cm) Tissue and other material debrided: Non-Viable, Slough, Subcutaneous, Slough Level: Skin/Subcutaneous Tissue Debridement Description: Excisional Instrument: Curette Bleeding:  Minimum Hemostasis Achieved: Pressure End Time: 10:06 Procedural Pain: 0 Post Procedural Pain: 0 Response to Treatment: Procedure was tolerated well Level of Consciousness (Post- Awake and Alert procedure): Post Debridement Measurements of Total Wound Length: (cm) 0.9 Width: (cm) 1 Depth: (cm) 0.2 Volume: (cm) 0.141 Character of Wound/Ulcer Post Debridement: Improved Post Procedure Diagnosis Same as Pre-procedure Notes Scribed for Dr. Celine Ahr by J.[name] Electronic Signature(s) Signed: 04/24/2022 1:18:05 PM By: [name] MD FACS Signed: 04/24/2022 4:50:01 PM By: [name] RN [name], Kourtlyn D ([id number]) PM By: [name] RN [phone].pdf Page 2 of 12 Signed: 04/24/2022 4:50:01 Entered By: [name] on 04/24/2022 10:13:33 -------------------------------------------------------------------------------- HPI Details Patient Name: Date of Service: Erin, [name] 04/24/2022 9:30 A M Medical Record Number: [id number] Patient Account Number: [id number] Date of Birth/Sex: Treating RN: 1956-02-09 (67 y.o. 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 She tells [name] that she fell on the gravel driveway in July. She developed injuries on her distal lower leg which have not healed. She saw her primary physician on 11/15/2017 who noted her left shin injuries. Gave her antibiotics. At that point the wounds were almost circumferential however most were less than 1.5 cm. Weeping edema fluid was noted. She was referred here for evaluation. The patient has a history of chronic lower extremity edema. 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 She has had prior history with leg weeping fluid. She does not wear compression stockings. She is not doing anything specific to these wound areas. 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 She also has lymphedema. Put her in 3 layer compression and silver alginate last week. She has 3 small wounds with depth just lateral to the tibia. More problematically than this she has numerous shallow areas some of which are almost canal like in shape with tightly adherent painful debris. It would be very difficult and time- consuming to go through this and attempt to individually debride all these areas.. I changed her to collagen today to see if that would help with any of the surface debris on some of these wounds. Otherwise we will not be able to put this in compression we have to have someone change the dressing. The patient is not eligible for home health 12/25/17 on evaluation today patient actually appears to be doing rather well in regard to the ulcer on her lower extremity. Fortunately there does not appear to be evidence of infection at this time. She has been tolerating the dressing changes without complication. This includes the compression wrap. The only issue she had was that the wrap was initially placed over her bunion region which actually calls her some discomfort and pain. Other than that things seem to be going rather well. 01/01/2018 Seen today for follow-up and management of left lower extremity wound and lymphedema. T oday she presents with a new wound towards to the left lateral LE. Recently treated with a 7 day course of amoxicillin; reason for  antibiotic dose is unknown at this time. Tolerating current treatment of collagen with 4- layer wraps. She obtained a venous reflux study on 12/25/17. Studies show on the right abnormal reflux times of the popliteal vein, great saphenous vein at the saphenofemoral junction at the proximal thigh, great saphenous vein at the mid calf, and origin of the small saphenous vein.No superficial thrombosis. No deep vein thrombosis in the common femoral, femoral,and popliteal veins. Left abnormal reflex times as well of the common femoral vein, popliteal vein, and a great saphenous vein at the saphenofemoral junction, In great saphenous vein at the mid thigh w/o thrombosis. Has any issues or concerns during visit today. Recommended follow-up to vascular specialist due to abnormalities from the venous reflux study. Denies fever, pain, chills, dizziness, nausea, or vomiting. 01/08/18 upon evaluation today the patient actually seems to be showing some signs of improvement in my opinion at this point in regard to the lower extremity ulcerated areas. She still has a lot of drainage but fortunately nothing that appears to be too significant currently. I have been very happy with the overall progress I see today compared to where things were during the last evaluation that I had with her. Nonetheless she has not had her appointment with the vein specialist as of yet in fact we were able to get this approved for her today and confirmed with them she will be seeing them on December 26. Nonetheless in general I do feel like the compression wraps is doing well for her. 01/17/18; quite a bit of improvement since last time I saw this patient she has a small open area remaining on the left lateral calf and even smaller area medially. She has lymphedema chronic stasis changes with distal skin fibrosis. She has an appointment with vascular surgery later this month 01/24/2018; the patient's medial leg has closed. Still a small  open area on the left lateral leg. She states that the 4 layer compression we put on last week was too tight and she had to take it off  over a few days ago. We have had resultant increase in her lymphedema in the dorsal foot and a proximal calf. Fortunately that does not seem to have resulted in any deterioration in her wounds The patient is going to need compression stockings. We have given her measurements to phone elastic therapy in [place]. She has vascular surgery consult on December 26 02/07/18; she's had continuous contraction on the left lateral leg wound which is now very small. Currently using silver alginate under 3 layer compression She saw Dr. Trula Slade of vascular surgery on 02/06/18. It was noted that she had a normal reflux times in the popliteal vein, great saphenous vein at the saphenofemoral junction, great saphenous vein at the proximal thigh great saphenous vein at the mid calf and origin of the small saphenous vein. It was noted that she had significant reflux in the left saphenous veins with diameter measurements in the 0.7-0.8 cm range. It was felt she would benefit from laser ablation to help minimize the risk of ulcer recurrence. It was recommended that she wear 20-30 thigh-high compression stockings and have follow-up in 4-6 weeks 02/17/2018; I thought this lady would be healed however her compression slipped down and she developed increasing swelling and the wound is actually larger. 1/13; we had deterioration last week after the patient's compression slipped down and she developed periwound swelling. I increased her compression before layers we have been using silver alginate we are a lot better again today. She has her compression stockings in waiting 1/23; the patient's wounds are totally healed today. She has her stockings. This was almost circumferential skin damage. She follows up with Dr. Trula Slade of vascular surgery next Monday. READMISSION 02/21/2021 This is a now  67 year old woman that we had in clinic here discharging in January 2020 with wounds on her left calf chronic venous insufficiency. She was discharged with 30/40 stockings. It does not sound like she has worn stockings in about a year largely from not being able to get them on herself. In November she developed new blisters on her legs an area laterally is opened into a fairly sizable wound. She has weeping posteriorly as well. She has been using Neosporin and Band-Aids. The patient did see Dr. Trula Slade in 2019 and 2020. He felt she might benefit from laser ablation in her left saphenous veins although because of the wound that healed I do not think he went through with it. She might benefit from seeing him again. ZELENE, WHITBY ([id number]) [id number] Page 3 of 12 Her ABI on the left is 1. 1/17; patient's wound on the posterior left calf is closed she has the 2 large areas last week. We put her in 4-layer compression for edema control is a lot better. Our intake nurse noted greenish drainage and odor. We have been using silver alginate 1/25; PCR culture I did have the substantial wound area on the left lateral lower leg showed staff aureus and group A strep. Low titers of coag negative staph which are probably skin contaminants. Resistance detected to tetracycline methicillin and macrolides. I gave her a starter kit of Nuzyra 150 mg x 3 for 2 days then 300 mg for a further 7 days. Marked odor considerable increase in surrounding erythema. We are using Iodoflex last week however I have changed to silver alginate with underlying Bactroban 2/1; she is completing her [place] tomorrow. The degree of erythema around the wounds looks a lot better. Odor has improved. I gave her silver alginate and Bactroban last week  and changing her back to Iodoflex to continue with ongoing debridement 2/8; the periwound looks a lot better. Surface of the wound also looks somewhat better  although there is still ongoing debridement to be done we have been using Iodoflex under compression. Primary dressing and silver alginate 2/15; left posterior calf. Some improvement in the surface of the wound but still very gritty we have been using Iodoflex under compression. 04/05/2021: Left lateral posterior calf. She continues to have significant amounts of drainage, some of which is probably comprised of the Iodoflex microbleeds. There is no odor to the drainage. The satellite lesion on the more posterior aspect of the calf is epithelializing nicely and has contracted quite a bit. There is robust granulation tissue in the dominant wound with minimal adherent slough. 04/12/2021: The satellite lesion has nearly closed. She continues to have good granulation tissue with minimal slough of the larger primary wound. She continues to have a fair amount of drainage, but I think this is less secondary to switching her dressing from Iodoflex to Prisma. 04/19/2021: The satellite lesion is almost completely epithelialized. The larger primary wound continues to contract with good granulation tissue and minimal slough. She is currently in [place] with compression. 04/26/2021: The satellite lesion has closed. The larger primary wound has contracted further and the granulation tissue is robust without being hypertrophic. Minimal slough is present. 05/03/2021: The satellite lesion remains closed. The larger primary wound has a bit of slough, but has also contracted further with good perimeter epithelialization. Good granulation tissue at the wound surface. There was some greenish drainage on the dressing when it was removed; it is not the blue-green typically associated with Pseudomonas aeruginosa, however. 05/10/2021: The primary wound continues to contract. There is minimal slough. The granulation tissue at 9:00 is a bit hypertrophic. No significant drainage. No odor. 05/17/2021: The wound is a little bit smaller  today with good granulation tissue. Minimal slough. No significant drainage or odor. 05/24/2021: The wound continues to contract and has a nice base of granulation tissue. Small amount of slough. No concern for infection. 05/31/2021: For some reason, the wound measured slightly larger today but overall it still appears to be in good condition with a nice base of granulation tissue and minimal slough. 06/07/2021: The wound is smaller today. Good granulation tissue and minimal slough. 06/14/2021: The wound is unchanged in size. She continues to accumulate some slough. Good granulation tissue on the surface. 06/20/2021: The wound is smaller today. Minimal slough with good granulation tissue. 06/27/2021: The wound on her left lateral leg is smaller today with just a bit of slough and eschar accumulation. Unfortunately, she has opened a new superficial wound on her right lower extremity. She has 3+ pitting edema to the knees on that leg and she does not wear compression stockings. 07/04/2021: In addition to the superficial wound on her right lower extremity that she opened up last week, she has opened 3 additional sites on the same leg. Her compression wraps were clearly not in correct position when she came to clinic today as they were at about the mid calf level rather than to the tibial tuberosity. She says that they slipped earlier and she had to come back on Friday to have them redone. The wound on her left lateral leg is perhaps slightly larger with some slough accumulation. 07/11/2021: The superficial wounds on her right lower extremity are nearly closed. They just have a thin layer of eschar overlying them. The wound on her left lateral  leg is a little bit shallower and has a bit of slough accumulation. 07/17/2021: The right medial lower extremity leg wounds have almost completely closed; one of them just has a tiny opening with a little bit of serous drainage. The left lateral leg wound is really unchanged.  It seems to be stalled. 07/24/2021: The right leg wounds are completely closed. The left lateral leg wound is actually bigger today. It is a bit more tender. There is a minor accumulation of slough on the surface. 07/31/2021: The culture that I took last week was positive for MRSA. We added mupirocin under the silver alginate and I prescribed doxycycline. She has been tolerating this well. The wound is smaller today but does have slough accumulation. No significant pain or drainage. 08/07/2021: She completed her course of doxycycline. The wound looks much better today. It is smaller and the periwound is less inflamed. She does have some slough accumulation on the surface. 08/14/2021: The wound continues to contract. There is slough on the wound surface, but the periwound is intact without inflammation or induration. 08/21/2021: The wound is down to just 2 small open sites. They both have a bit of slough accumulation. 08/28/2021: The wound is down to just 1 small open site with a little bit of slough and eschar accumulation. 09/04/2021: The wound continues to contract but remains open. It is clean without any slough. 09/12/2021: No real change in the overall wound dimensions, but it is flush with the surrounding skin. There is a little bit of slough accumulation. Edema control is good. 09/22/2021: The wound is smaller and even more superficial. Minimal slough accumulation. Good control of edema. 09/29/2021: The wound continues to contract. She reports that it was a little bit "twingey" during the week. It is a little bit dry on inspection today. Light accumulation of slough. Good edema control. 10/06/2021: The wound is about the same size today. There is a little slough on the surface. Edema control is good. SHABREE, CRIBB ([id number]) [id number] Page 4 of 12 10/13/2021: The wound is about the same size but more superficial. A little bit of slough on the surface. 10/23/2021: The  wound is slightly smaller and continues to fill in. Minimal slough on the wound surface. 10/30/2021: The wound continues to contract but has not yet closed. Light slough and eschar present. 11/06/2021: The wound persists, but seems a little bit more epithelialized. There is still slough and eschar accumulation. 11/14/2021: The wound continues to contract but persists. Light eschar around the wound. 11/22/2021: The wound is down to just a pinhole opening. There is eschar overlying the surface. Edema control is excellent. 11/29/2021: Her wound is closed. READMISSION 01/22/2022 This is a patient to be discharged in October. She has severe chronic venous insufficiency at that time she had a wound on her left lower leg posteriorly also the right leg at some point. These closed. We discharged her in 30/40 mm stockings from elastic therapy which she has been wearing religiously. She tells [name] that she was traumatized by a dolly while shopping at [employer] about 2 to 3 weeks ago. She has been left with a left anterior lower leg wound. She comes in in another pair of stockings other than her 3040s. She does not have an arterial issue with her last ABI in the left at 1.2 12/18; this is a patient who has chronic venous insufficiency and recurrent venous insufficiency ulcers. She has a area on her left anterior lower leg and we readmitted her to  the clinic last week. We use silver alginate and 4-layer compression. ABI was 1.2 She brought in her stocking which was a 20/30 below-knee stocking from elastic therapy. She may require a 30/40 mm equivalent stockings after this wound heals. 02/06/2022: The intake nurse reported an odor coming from the wound when she first unwrapped it but this abated after the leg was washed. There is thick layer of slough on the surface. 02/13/2022: The wound is cleaner today. It measured larger, but on visual inspection, I think some crust of Iodoflex was included in the wound measurements;  the actual wound itself looks about the same to slightly smaller. Edema control is good. 02/20/2022: The wound is cleaner again today. It is also measuring a little bit smaller, but there has been some moisture related periwound breakdown. Edema control is good. 02/27/2022: The intake nurse reported that the wound measures larger, but some of this ended up being crusted on Iodoflex. There is still some periwound moisture. Still with thick slough accumulation. Edema control is good. 03/06/2022: The wound is cleaner and more superficial, but did measure larger because of the inclusion of a satellite area. Still with some slough accumulation but less so than on prior visits. 03/13/2022: The wound measured a little bit smaller today. There is slough and eschar accumulation, as per usual. 03/20/2022: Her wound is larger and deeper today. The entire surface appears nonviable. There is more periwound erythema and threatened tissue breakdown. 03/27/2022: The culture that I took last week was positive for MRSA. We had empirically applied topical mupirocin to her wound. Today the wound is smaller and cleaner and less painful. There is still a layer of slough on the surface. 04/03/2022: The wound was measured slightly larger today, but appears roughly the same to my eye. There is a layer of slough on the surface. Underneath, the tissue is quite fibrotic. 04/10/2022: The wound is slightly smaller today. There is slough and eschar accumulation. For some reason, her wrap got changed from 4 layers to 3 layers and her edema control is not as good, as a result. 04/17/2022: The return to true 4-layer compression has improved the wound considerably. It is much smaller and cleaner today. Edema control is excellent. 04/24/2022: Her wound continues to improve. It is smaller with just a bit of slough on the surface. Edema control is excellent. Electronic Signature(s) Signed: 04/24/2022 10:56:12 AM By: [name] MD  FACS Entered By: [name] on 04/24/2022 10:56:12 -------------------------------------------------------------------------------- Physical Exam Details Patient Name: Date of Service: Erin Gerold [name]. 04/24/2022 9:30 A M Medical Record Number: [id number] Patient Account Number: [id number] Date of Birth/Sex: Treating RN: September 05, 1955 (67 y.o. F) Primary Care Provider: [name] Other Clinician: Referring Provider: Treating Provider/Extender: [name] in Treatment: 13 Constitutional . . . . no acute distress. Respiratory Normal work of breathing on room air. Notes 04/24/2022: Her wound continues to improve. It is smaller with just a bit of slough on the surface. Edema control is excellent. [name], [name] ([id number]) [id number] Page 5 of 12 Electronic Signature(s) Signed: 04/24/2022 10:56:43 AM By: [name] MD FACS Entered By: [name] on 04/24/2022 10:56:43 -------------------------------------------------------------------------------- Physician Orders Details Patient Name: Date of Service: Erin Gerold [name]. 04/24/2022 9:30 A M Medical Record Number: [id number] Patient Account Number: [id number] Date of Birth/Sex: Treating RN: 1955-09-10 (67 y.o. 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 - Dr. Celine Ahr Room 3 Return A Anesthetic (In clinic) Topical Lidocaine 5% applied to wound bed Bathing/ Shower/ Hygiene May shower with protection but do not get wound  dressing(s) wet. Protect dressing(s) with water repellant cover (for example, large plastic bag) or a cast cover and may then take shower. - Please do not get the Left lower leg Compression wraps wet. Edema Control - Lymphedema / SCD / Other Avoid standing for long periods of time. Patient to wear own compression stockings every day. Exercise regularly Moisturize legs daily. Wound Treatment Wound #7 - Lower Leg Wound Laterality: Left, Anterior Cleanser: Soap and Water 1 x Per Week/30 Days Discharge Instructions: May shower and wash wound with dial antibacterial soap and water prior to dressing change. Cleanser: Wound Cleanser 1 x Per Week/30 Days Discharge Instructions: Cleanse the wound with wound cleanser prior to applying a clean dressing using gauze sponges, not tissue or cotton balls. Peri-Wound Care: Ketoconazole Cream 2% 1 x Per Week/30 Days Discharge Instructions: Apply Ketoconazole as directed Peri-Wound Care: Triamcinolone 15 (g) 1 x Per Week/30 Days Discharge Instructions: Use triamcinolone 15 (g) as directed Peri-Wound Care: Zinc Oxide Ointment 30g tube 1 x Per Week/30 Days Discharge Instructions: Apply Zinc Oxide to periwound with each dressing change Peri-Wound Care: Sween Lotion (Moisturizing lotion) 1 x Per Week/30 Days Discharge Instructions: Apply moisturizing lotion as directed Topical: Mupirocin Ointment 1 x Per Week/30 Days Discharge Instructions: Apply Mupirocin (Bactroban) as instructed Prim Dressing: Sorbalgon AG Dressing, 4x4 (in/in) 1 x Per Week/30 Days ary Discharge Instructions: Apply to wound bed as instructed Secondary Dressing: Woven Gauze Sponge, Non-Sterile 4x4 in 1 x Per Week/30 Days Discharge Instructions: Apply over primary dressing as directed. Secondary Dressing: Zetuvit Plus 4x8 in 1 x Per Week/30 Days Discharge Instructions: Apply over primary dressing as directed. Compression Wrap: FourPress (4 layer compression wrap) 1 x Per Week/30  Days Discharge Instructions: Apply four layer compression as directed. May also use Urgo K2 compression system as alternative. Compression Wrap: Netting #5 1 x Per Week/30 Days SARON, KEIGHTLEY ([id number]) [phone].pdf Page 6 of 12 Electronic Signature(s) Signed: 04/24/2022 1:18:05 PM By: [name] MD FACS Entered By: [name] on 04/24/2022 10:57:00 -------------------------------------------------------------------------------- Problem List Details Patient Name: Date of Service: Erin Gerold [name]. 04/24/2022 9:30 A M Medical Record Number: [id number] Patient Account Number: [id number] Date of Birth/Sex: Treating RN: Mar 23, 1955 (67 y.o. 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 04/24/2022 9:30 A M Medical Record Number: [id number] Patient Account Number: [id number] Date of Birth/Sex: Treating RN: June 09, 1955 (67 y.o. F) Primary Care Provider: [name] Other Clinician: Referring Provider: Treating Provider/Extender: [name] in Treatment: 13 Subjective Chief Complaint Information obtained from Patient 04/19/2021: The patient is here for ongoing follow-up  regarding 2 left  lower extremity wounds. 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 She tells [name] that she fell on the gravel driveway in July. She developed injuries on her distal lower leg which have not healed. She saw her primary physician on 11/15/2017 who noted her left shin injuries. Gave her antibiotics. At that point the wounds were almost circumferential however most were less than 1.5 cm. Weeping edema fluid was noted. She was referred here for evaluation. The patient has a history of chronic lower extremity edema. 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 She has had prior history with leg weeping fluid. She does not wear compression stockings. She is not doing anything specific to these wound areas. The patient has a history of obesity, arthritis, peripheral vascular disease hypertension lower extremity edema and Schamberg's disease ABI in our clinic was 1.3 on the left ZORIANNA, MCARTOR D ([id number]) [id number] Page 7 of 12 12/17/2017; patient readmitted to the clinic last week. 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 She also has lymphedema. Put her in 3 layer compression and silver alginate last week. She has 3 small wounds with depth just lateral to the tibia. More problematically than this she has numerous shallow areas some of which are almost canal like in shape with tightly adherent painful debris. It would be very difficult and time- consuming to go through this and attempt to individually debride all these areas.. I changed her to collagen today to see if that would help with any of the surface debris on some of these  wounds. Otherwise we will not be able to put this in compression we have to have someone change the dressing. The patient is not eligible for home health 12/25/17 on evaluation today patient actually appears to be doing rather well in regard to the ulcer on her lower extremity. Fortunately there does not appear to be evidence of infection at this time. She has been tolerating the dressing changes without complication. This includes the compression wrap. The only issue she had was that the wrap was initially placed over her bunion region which actually calls her some discomfort and pain. Other than that things seem to be going rather well. 01/01/2018 Seen today for follow-up and management of left lower extremity wound and lymphedema. T oday she presents with a new wound towards to the left lateral LE. Recently treated with a 7 day course of amoxicillin; reason for antibiotic dose is unknown at this time. Tolerating current treatment of collagen with 4- layer wraps. She obtained a venous reflux study on 12/25/17. Studies show on the right abnormal reflux times of the popliteal vein, great saphenous vein at the saphenofemoral junction at the proximal thigh, great saphenous vein at the mid calf, and origin of the small saphenous vein.No superficial thrombosis. No deep vein thrombosis in the common femoral, femoral,and popliteal veins. Left abnormal reflex times as well of the common femoral vein, popliteal vein, and a great saphenous vein at the saphenofemoral junction, In great saphenous vein at the mid thigh w/o thrombosis. Has any issues or concerns during visit today. Recommended follow-up to vascular specialist due to abnormalities from the venous reflux study. Denies fever, pain,  chills, dizziness, nausea, or vomiting. 01/08/18 upon evaluation today the patient actually seems to be showing some signs of improvement in my opinion at this point in regard to the lower extremity ulcerated areas. She  still has a lot of drainage but fortunately nothing that appears to be too significant currently. I have been very happy with the overall progress I see today compared to where things were during the last evaluation that I had with her. Nonetheless she has not had her appointment with the vein specialist as of yet in fact we were able to get this approved for her today and confirmed with them she will be seeing them on December 26. Nonetheless in general I do feel like the compression wraps is doing well for her. 01/17/18; quite a bit of improvement since last time I saw this patient she has a small open area remaining on the left lateral calf and even smaller area medially. She has lymphedema chronic stasis changes with distal skin fibrosis. She has an appointment with vascular surgery later this month 01/24/2018; the patient's medial leg has closed. Still a small open area on the left lateral leg. She states that the 4 layer compression we put on last week was too tight and she had to take it off over a few days ago. We have had resultant increase in her lymphedema in the dorsal foot and a proximal calf. Fortunately that does not seem to have resulted in any deterioration in her wounds The patient is going to need compression stockings. We have given her measurements to phone elastic therapy in [place]. She has vascular surgery consult on December 26 02/07/18; she's had continuous contraction on the left lateral leg wound which is now very small. Currently using silver alginate under 3 layer compression She saw Dr. Trula Slade of vascular surgery on 02/06/18. It was noted that she had a normal reflux times in the popliteal vein, great saphenous vein at the saphenofemoral junction, great saphenous vein at the proximal thigh great saphenous vein at the mid calf and origin of the small saphenous vein. It was noted that she had significant reflux in the left saphenous veins with diameter measurements in the  0.7-0.8 cm range. It was felt she would benefit from laser ablation to help minimize the risk of ulcer recurrence. It was recommended that she wear 20-30 thigh-high compression stockings and have follow-up in 4-6 weeks 02/17/2018; I thought this lady would be healed however her compression slipped down and she developed increasing swelling and the wound is actually larger. 1/13; we had deterioration last week after the patient's compression slipped down and she developed periwound swelling. I increased her compression before layers we have been using silver alginate we are a lot better again today. She has her compression stockings in waiting 1/23; the patient's wounds are totally healed today. She has her stockings. This was almost circumferential skin damage. She follows up with Dr. Trula Slade of vascular surgery next Monday. READMISSION 02/21/2021 This is a now 67 year old woman that we had in clinic here discharging in January 2020 with wounds on her left calf chronic venous insufficiency. She was discharged with 30/40 stockings. It does not sound like she has worn stockings in about a year largely from not being able to get them on herself. In November she developed new blisters on her legs an area laterally is opened into a fairly sizable wound. She has weeping posteriorly as well. She has been using Neosporin and Band-Aids. The patient did see  Dr. Trula Slade in 2019 and 2020. He felt she might benefit from laser ablation in her left saphenous veins although because of the wound that healed I do not think he went through with it. She might benefit from seeing him again. Her ABI on the left is 1. 1/17; patient's wound on the posterior left calf is closed she has the 2 large areas last week. We put her in 4-layer compression for edema control is a lot better. Our intake nurse noted greenish drainage and odor. We have been using silver alginate 1/25; PCR culture I did have the substantial wound area on  the left lateral lower leg showed staff aureus and group A strep. Low titers of coag negative staph which are probably skin contaminants. Resistance detected to tetracycline methicillin and macrolides. I gave her a starter kit of Nuzyra 150 mg x 3 for 2 days then 300 mg for a further 7 days. Marked odor considerable increase in surrounding erythema. We are using Iodoflex last week however I have changed to silver alginate with underlying Bactroban 2/1; she is completing her [place] tomorrow. The degree of erythema around the wounds looks a lot better. Odor has improved. I gave her silver alginate and Bactroban last week and changing her back to Iodoflex to continue with ongoing debridement 2/8; the periwound looks a lot better. Surface of the wound also looks somewhat better although there is still ongoing debridement to be done we have been using Iodoflex under compression. Primary dressing and silver alginate 2/15; left posterior calf. Some improvement in the surface of the wound but still very gritty we have been using Iodoflex under compression. 04/05/2021: Left lateral posterior calf. She continues to have significant amounts of drainage, some of which is probably comprised of the Iodoflex microbleeds. There is no odor to the drainage. The satellite lesion on the more posterior aspect of the calf is epithelializing nicely and has contracted quite a bit. There is robust granulation tissue in the dominant wound with minimal adherent slough. 04/12/2021: The satellite lesion has nearly closed. She continues to have good granulation tissue with minimal slough of the larger primary wound. She continues to have a fair amount of drainage, but I think this is less secondary to switching her dressing from Iodoflex to Prisma. 04/19/2021: The satellite lesion is almost completely epithelialized. The larger primary wound continues to contract with good granulation tissue and minimal slough. She is currently in  [place] with compression. 04/26/2021: The satellite lesion has closed. The larger primary wound has contracted further and the granulation tissue is robust without being hypertrophic. Minimal slough is present. 05/03/2021: The satellite lesion remains closed. The larger primary wound has a bit of slough, but has also contracted further with good perimeter epithelialization. Good granulation tissue at the wound surface. There was some greenish drainage on the dressing when it was removed; it is not the blue-green typically associated with Pseudomonas aeruginosa, however. [name], [name] ([id number]) [id number] Page 8 of 12 05/10/2021: The primary wound continues to contract. There is minimal slough. The granulation tissue at 9:00 is a bit hypertrophic. No significant drainage. No odor. 05/17/2021: The wound is a little bit smaller today with good granulation tissue. Minimal slough. No significant drainage or odor. 05/24/2021: The wound continues to contract and has a nice base of granulation tissue. Small amount of slough. No concern for infection. 05/31/2021: For some reason, the wound measured slightly larger today but overall it still appears to be in good condition with a nice  base of granulation tissue and minimal slough. 06/07/2021: The wound is smaller today. Good granulation tissue and minimal slough. 06/14/2021: The wound is unchanged in size. She continues to accumulate some slough. Good granulation tissue on the surface. 06/20/2021: The wound is smaller today. Minimal slough with good granulation tissue. 06/27/2021: The wound on her left lateral leg is smaller today with just a bit of slough and eschar accumulation. Unfortunately, she has opened a new superficial wound on her right lower extremity. She has 3+ pitting edema to the knees on that leg and she does not wear compression stockings. 07/04/2021: In addition to the superficial wound on her right lower extremity that  she opened up last week, she has opened 3 additional sites on the same leg. Her compression wraps were clearly not in correct position when she came to clinic today as they were at about the mid calf level rather than to the tibial tuberosity. She says that they slipped earlier and she had to come back on Friday to have them redone. The wound on her left lateral leg is perhaps slightly larger with some slough accumulation. 07/11/2021: The superficial wounds on her right lower extremity are nearly closed. They just have a thin layer of eschar overlying them. The wound on her left lateral leg is a little bit shallower and has a bit of slough accumulation. 07/17/2021: The right medial lower extremity leg wounds have almost completely closed; one of them just has a tiny opening with a little bit of serous drainage. The left lateral leg wound is really unchanged. It seems to be stalled. 07/24/2021: The right leg wounds are completely closed. The left lateral leg wound is actually bigger today. It is a bit more tender. There is a minor accumulation of slough on the surface. 07/31/2021: The culture that I took last week was positive for MRSA. We added mupirocin under the silver alginate and I prescribed doxycycline. She has been tolerating this well. The wound is smaller today but does have slough accumulation. No significant pain or drainage. 08/07/2021: She completed her course of doxycycline. The wound looks much better today. It is smaller and the periwound is less inflamed. She does have some slough accumulation on the surface. 08/14/2021: The wound continues to contract. There is slough on the wound surface, but the periwound is intact without inflammation or induration. 08/21/2021: The wound is down to just 2 small open sites. They both have a bit of slough accumulation. 08/28/2021: The wound is down to just 1 small open site with a little bit of slough and eschar accumulation. 09/04/2021: The wound continues  to contract but remains open. It is clean without any slough. 09/12/2021: No real change in the overall wound dimensions, but it is flush with the surrounding skin. There is a little bit of slough accumulation. Edema control is good. 09/22/2021: The wound is smaller and even more superficial. Minimal slough accumulation. Good control of edema. 09/29/2021: The wound continues to contract. She reports that it was a little bit "twingey" during the week. It is a little bit dry on inspection today. Light accumulation of slough. Good edema control. 10/06/2021: The wound is about the same size today. There is a little slough on the surface. Edema control is good. 10/13/2021: The wound is about the same size but more superficial. A little bit of slough on the surface. 10/23/2021: The wound is slightly smaller and continues to fill in. Minimal slough on the wound surface. 10/30/2021: The wound continues to  contract but has not yet closed. Light slough and eschar present. 11/06/2021: The wound persists, but seems a little bit more epithelialized. There is still slough and eschar accumulation. 11/14/2021: The wound continues to contract but persists. Light eschar around the wound. 11/22/2021: The wound is down to just a pinhole opening. There is eschar overlying the surface. Edema control is excellent. 11/29/2021: Her wound is closed. READMISSION 01/22/2022 This is a patient to be discharged in October. She has severe chronic venous insufficiency at that time she had a wound on her left lower leg posteriorly also the right leg at some point. These closed. We discharged her in 30/40 mm stockings from elastic therapy which she has been wearing religiously. She tells [name] that she was traumatized by a dolly while shopping at [employer] about 2 to 3 weeks ago. She has been left with a left anterior lower leg wound. She comes in in another pair of stockings other than her 3040s. She does not have an arterial issue with her last ABI  in the left at 1.2 12/18; this is a patient who has chronic venous insufficiency and recurrent venous insufficiency ulcers. She has a area on her left anterior lower leg and we readmitted her to the clinic last week. We use silver alginate and 4-layer compression. ABI was 1.2 She brought in her stocking which was a 20/30 below-knee stocking from elastic therapy. She may require a 30/40 mm equivalent stockings after this wound heals. 02/06/2022: The intake nurse reported an odor coming from the wound when she first unwrapped it but this abated after the leg was washed. There is thick layer of slough on the surface. 02/13/2022: The wound is cleaner today. It measured larger, but on visual inspection, I think some crust of Iodoflex was included in the wound measurements; the actual wound itself looks about the same to slightly smaller. Edema control is good. 02/20/2022: The wound is cleaner again today. It is also measuring a little bit smaller, but there has been some moisture related periwound breakdown. Edema AXEL, HACKETT ([id number]) [id number] Page 9 of 12 control is good. 02/27/2022: The intake nurse reported that the wound measures larger, but some of this ended up being crusted on Iodoflex. There is still some periwound moisture. Still with thick slough accumulation. Edema control is good. 03/06/2022: The wound is cleaner and more superficial, but did measure larger because of the inclusion of a satellite area. Still with some slough accumulation but less so than on prior visits. 03/13/2022: The wound measured a little bit smaller today. There is slough and eschar accumulation, as per usual. 03/20/2022: Her wound is larger and deeper today. The entire surface appears nonviable. There is more periwound erythema and threatened tissue breakdown. 03/27/2022: The culture that I took last week was positive for MRSA. We had empirically applied topical mupirocin to her wound.  Today the wound is smaller and cleaner and less painful. There is still a layer of slough on the surface. 04/03/2022: The wound was measured slightly larger today, but appears roughly the same to my eye. There is a layer of slough on the surface. Underneath, the tissue is quite fibrotic. 04/10/2022: The wound is slightly smaller today. There is slough and eschar accumulation. For some reason, her wrap got changed from 4 layers to 3 layers and her edema control is not as good, as a result. 04/17/2022: The return to true 4-layer compression has improved the wound considerably. It is much smaller and cleaner today. Edema control  is excellent. 04/24/2022: Her wound continues to improve. It is smaller with just a bit of slough on the surface. Edema control is excellent. Patient History Information obtained from Patient. Family History Heart Disease - Mother,Father, Hypertension - Mother,Father, Kidney Disease - Mother, Thyroid Problems - Mother, No family history of Cancer, Diabetes, Hereditary Spherocytosis, Lung Disease, Seizures, Stroke, Tuberculosis. Social History Never smoker, Marital Status - Single, Alcohol Use - Never, Drug Use - No History, Caffeine Use - Daily - coffee. Medical History Eyes Patient has history of Cataracts Hematologic/Lymphatic Patient has history of Lymphedema Cardiovascular Patient has history of Hypertension, Peripheral Venous Disease Integumentary (Skin) Denies history of History of Burn Musculoskeletal Patient has history of Osteoarthritis Hospitalization/Surgery History - colostomy reversal. 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 Medical A Surgical History Notes nd Constitutional Symptoms (General Health) morbid obesity Cardiovascular schamberg disease, hyperlipidemia Gastrointestinal diverticulitis , h/o obstruction due to diverticulitis , colostomy and colostomy reversal Endocrine hypothyroidism Objective Constitutional no acute distress. Vitals Time  Taken: 9:39 AM, Height: 62 in, Weight: 222 lbs, BMI: 40.6, Temperature: 98.2 F, Pulse: 73 bpm, Respiratory Rate: 16 breaths/min, Blood Pressure: 116/75 mmHg. Respiratory Normal work of breathing on room air. General Notes: 04/24/2022: Her wound continues to improve. It is smaller with just a bit of slough on the surface. Edema control is excellent. Integumentary (Hair, Skin) Wound #7 status is Open. Original cause of wound was Blister. The date acquired was: 01/22/2022. The wound has been in treatment 13 weeks. The wound is located on the Left,Anterior Lower Leg. The wound measures 0.9cm length x 1cm width x 0.2cm depth; 0.707cm^2 area and 0.141cm^3 volume. There is Fat Layer (Subcutaneous Tissue) exposed. There is no tunneling or undermining noted. There is a medium amount of serosanguineous drainage noted. The wound margin is distinct with the outline attached to the wound base. There is large (67-100%) pink granulation within the wound bed. There is a small (1-33%) amount of necrotic tissue within the wound bed including Adherent Slough. The periwound skin appearance had no abnormalities noted for texture. The periwound skin [name], [name] ([id number]) [id number] Page 10 of 12 appearance exhibited: Dry/Scaly, Maceration, Hemosiderin Staining. The periwound skin appearance did not exhibit: Erythema. Periwound temperature was noted as No Abnormality. 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 There was a Excisional Skin/Subcutaneous Tissue Debridement with a total area of 0.9 sq cm performed by [name], MD. With the following instrument(s): Curette to remove  Non-Viable tissue/material. Material removed includes Subcutaneous Tissue and Slough and after achieving pain control using Lidocaine 5% topical ointment. No specimens were taken. A time out was conducted at 10:05, prior to the start of the procedure. A Minimum amount of bleeding was controlled with Pressure. The procedure was tolerated well with a pain level of 0 throughout and a pain level of 0 following the procedure. Post Debridement Measurements: 0.9cm length x 1cm width x 0.2cm depth; 0.141cm^3 volume. Character of Wound/Ulcer Post Debridement is improved. Post procedure Diagnosis Wound #7: Same as Pre-Procedure General Notes: Scribed for Dr. Celine Ahr by J.[name]. Pre-procedure diagnosis of Wound #7 is a Lymphedema located on the Left,Anterior Lower Leg . There was a Four Layer Compression Therapy Procedure by [name], RN. Post procedure Diagnosis Wound #7: Same as Pre-Procedure Plan Follow-up Appointments: Return Appointment  in 1 week. - Dr. Celine Ahr Room 3 Anesthetic: (In clinic) Topical Lidocaine 5% applied to wound bed Bathing/ Shower/ Hygiene: May shower with protection but do not get wound dressing(s) wet. Protect dressing(s) with water repellant cover (for example, large plastic bag) or a cast cover and may then take shower. - Please do not get the Left lower leg Compression wraps wet. Edema Control - Lymphedema / SCD / Other: Avoid standing for long periods of time. Patient to wear own compression stockings every day. Exercise regularly Moisturize legs daily. WOUND #7: - Lower Leg Wound Laterality: Left, Anterior Cleanser: Soap and Water 1 x Per Week/30 Days Discharge Instructions: May shower and wash wound with dial antibacterial soap and water prior to dressing change. Cleanser: Wound Cleanser 1 x Per Week/30 Days Discharge Instructions: Cleanse the wound with wound cleanser prior to applying a clean dressing using gauze sponges, not tissue or cotton  balls. Peri-Wound Care: Ketoconazole Cream 2% 1 x Per Week/30 Days Discharge Instructions: Apply Ketoconazole as directed Peri-Wound Care: Triamcinolone 15 (g) 1 x Per Week/30 Days Discharge Instructions: Use triamcinolone 15 (g) as directed Peri-Wound Care: Zinc Oxide Ointment 30g tube 1 x Per Week/30 Days Discharge Instructions: Apply Zinc Oxide to periwound with each dressing change Peri-Wound Care: Sween Lotion (Moisturizing lotion) 1 x Per Week/30 Days Discharge Instructions: Apply moisturizing lotion as directed Topical: Mupirocin Ointment 1 x Per Week/30 Days Discharge Instructions: Apply Mupirocin (Bactroban) as instructed Prim Dressing: Sorbalgon AG Dressing, 4x4 (in/in) 1 x Per Week/30 Days ary Discharge Instructions: Apply to wound bed as instructed Secondary Dressing: Woven Gauze Sponge, Non-Sterile 4x4 in 1 x Per Week/30 Days Discharge Instructions: Apply over primary dressing as directed. Secondary Dressing: Zetuvit Plus 4x8 in 1 x Per Week/30 Days Discharge Instructions: Apply over primary dressing as directed. Com pression Wrap: FourPress (4 layer compression wrap) 1 x Per Week/30 Days Discharge Instructions: Apply four layer compression as directed. May also use Urgo K2 compression system as alternative. Com pression Wrap: Netting #5 1 x Per Week/30 Days 04/24/2022: Her wound continues to improve. It is smaller with just a bit of slough on the surface. Edema control is excellent. I used a curette to debride slough and subcutaneous tissue from the wound. We will continue topical mupirocin, silver alginate, and 4-layer compression. She will have a nurse visit next week due to limited provider availability and then follow-up with me in 2 weeks. NARDA, GRAFT ([id number]) [id number] Page 11 of 12 Electronic Signature(s) Signed: 04/24/2022 10:57:33 AM By: [name] MD FACS Entered By: [name] on 04/24/2022  10:57:33 -------------------------------------------------------------------------------- HxROS Details Patient Name: Date of Service: Erin Gerold [name]. 04/24/2022 9:30 A M Medical Record Number: [id number] Patient Account Number: [id number] Date of Birth/Sex: Treating RN: [birth year] (67 y.o. F) Primary Care Provider: [name] Other Clinician: Referring Provider: Treating Provider/Extender: [name] in Treatment: 13 Information Obtained From Patient Constitutional Symptoms (General Health) Medical History: Past Medical History Notes: morbid obesity Eyes Medical History: Positive for: Cataracts Hematologic/Lymphatic Medical History: Positive for: Lymphedema Cardiovascular Medical History: Positive for: Hypertension; Peripheral Venous Disease Past Medical History Notes: schamberg disease, hyperlipidemia Gastrointestinal Medical History: Past Medical History Notes: diverticulitis , h/o obstruction due to diverticulitis , colostomy and colostomy reversal Endocrine Medical History: Past Medical History Notes: hypothyroidism Integumentary (Skin) Medical History: Negative for: History of Burn Musculoskeletal Medical History: Positive for: Osteoarthritis HBO Extended History Items Eyes: Cataracts Immunizations Pneumococcal Vaccine: Received Pneumococcal Vaccination: Yes Received Pneumococcal Vaccination On or After  [address][name], [name] ([id number]) [id number] Page 12 of 12 Implantable Devices None Hospitalization / Surgery History Type of Hospitalization/Surgery colostomy reversal 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 Family and Social History Cancer: No; Diabetes: No; Heart Disease: Yes - Mother,Father; Hereditary Spherocytosis: No; Hypertension: Yes - Mother,Father; Kidney Disease: Yes - Mother; Lung Disease: No; Seizures: No; Stroke: No; Thyroid Problems: Yes - Mother; Tuberculosis: No;  Never smoker; Marital Status - Single; Alcohol Use: Never; Drug Use: No History; Caffeine Use: Daily - coffee; Financial Concerns: No; Food, Clothing or Shelter Needs: No; Support System Lacking: No; Transportation Concerns: No Electronic Signature(s) Signed: 04/24/2022 1:18:05 PM By: [name] MD FACS Entered By: [name] on 04/24/2022 10:56:24 -------------------------------------------------------------------------------- SuperBill Details Patient Name: Date of Service: Erin Gerold [name]. 04/24/2022 Medical Record Number: [id number] Patient Account Number: [id number] Date of Birth/Sex: Treating RN: Feb 24, 1955 (67 y.o. 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 with other spec Modifier: ified severity Quantity: 1 Physician Procedures : CPT4 Code Description Modifier [id number] - WC PHYS LEVEL 4 - EST PT 25 ICD-10 Diagnosis Description L97.928 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

## 2022-05-01 ENCOUNTER — Encounter (HOSPITAL_BASED_OUTPATIENT_CLINIC_OR_DEPARTMENT_OTHER): Payer: Commercial Managed Care - PPO | Admitting: Internal Medicine

## 2022-05-01 DIAGNOSIS — L97928 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: Secondary | ICD-10-CM | POA: Diagnosis not present

## 2022-05-01 DIAGNOSIS — M1990 Unspecified osteoarthritis, unspecified site: Secondary | ICD-10-CM | POA: Diagnosis not present

## 2022-05-01 DIAGNOSIS — I890 Lymphedema, not elsewhere classified: Secondary | ICD-10-CM | POA: Diagnosis not present

## 2022-05-01 DIAGNOSIS — I872 Venous insufficiency (chronic) (peripheral): Secondary | ICD-10-CM | POA: Diagnosis not present

## 2022-05-01 DIAGNOSIS — I10 Essential (primary) hypertension: Secondary | ICD-10-CM | POA: Diagnosis not present

## 2022-05-01 DIAGNOSIS — E785 Hyperlipidemia, unspecified: Secondary | ICD-10-CM | POA: Diagnosis not present

## 2022-05-01 DIAGNOSIS — I739 Peripheral vascular disease, unspecified: Secondary | ICD-10-CM | POA: Diagnosis not present

## 2022-05-01 DIAGNOSIS — E039 Hypothyroidism, unspecified: Secondary | ICD-10-CM | POA: Diagnosis not present

## 2022-05-02 NOTE — Progress Notes (Signed)
[name] ([id number]) [id number] Page 1 of 4 Visit Report for 05/01/2022 Arrival Information Details Patient Name: Date of Service: [name] 05/01/2022 9:30 A M Medical Record Number: [id number] Patient Account Number: [id number] Date of Birth/Sex: Treating RN: [birth year] (67 y.o. [name] Primary Care Keyna Blizard: [name] Other Clinician: Referring Mandolin Falwell: Treating Appollonia Klee/Extender: Alcario Drought in Treatment: 14 Visit Information History Since Last Visit Added or deleted any medications: No Patient Arrived: Cane Any new allergies or adverse reactions: No Arrival Time: 10:21 Had a fall or experienced change in No Accompanied By: self activities of daily living that may affect Transfer Assistance: None risk of falls: Patient Identification Verified: Yes Signs or symptoms of abuse/neglect since last visito No Patient Requires Transmission-Based Precautions: No Hospitalized since last visit: No Patient Has Alerts: No Implantable device outside of the clinic excluding No cellular tissue based products placed in the center since last visit: Has Dressing in Place as Prescribed: Yes Has Compression in Place as Prescribed: Yes Pain Present Now: No Electronic Signature(s) Signed: 05/01/2022 3:55:56 PM By: [name] RN Entered By: [name] on 05/01/2022 10:21:56 -------------------------------------------------------------------------------- Compression Therapy Details Patient Name: Date of Service: [name] 05/01/2022 9:30 A M Medical Record Number: [id number] Patient Account Number: [id number] Date of Birth/Sex: Treating RN: Jul 10, 1955 (67 y.o. [name] Primary Care Gustavo Meditz: [name] Other Clinician: Referring Concepcion Gillott: Treating Calder Oblinger/Extender: Alcario Drought in Treatment: 14 Compression Therapy Performed for Wound Assessment:  Wound #7 Left,Anterior Lower Leg Performed By: Clinician [name], RN Compression Type: Four Layer Electronic Signature(s) Signed: 05/01/2022 3:55:56 PM By: [name] RN Entered By: [name] on 05/01/2022 10:23:05 -------------------------------------------------------------------------------- Encounter Discharge Information Details Patient Name: Date of Service: [name] 05/01/2022 9:30 A M Medical Record Number: [id number] Patient Account Number: [id number] Date of Birth/Sex: Treating RN: [birth year] (67 y.o. [name] Primary Care Geraldyne Barraclough: [name] Other Clinician: Referring Braelen Sproule: Treating Tariq Pernell/Extender: Alcario Drought in Treatment: 14 Encounter Discharge Information Items Discharge Condition: Stable Ambulatory Status: Cane Discharge Destination: Home Transportation: Private Auto Accompanied By: self Schedule Follow-up Appointment: Yes Clinical Summary of Care: Patient [name] ([id number]) [phone].pdf Page 2 of 4 Electronic Signature(s) Signed: 05/01/2022 3:55:56 PM By: [name] RN Entered By: [name] on 05/01/2022 10:23:49 -------------------------------------------------------------------------------- Patient/Caregiver Education Details Patient Name: Date of Service: [name] 3/19/2024andnbsp9:30 [place] Record Number: [id number] Patient Account Number: [id number] Date of Birth/Gender: Treating RN: 04-Dec-1955 (67 y.o. [name] Primary Care Physician: [name] Other Clinician: Referring Physician: Treating Physician/Extender: Alcario Drought in Treatment: 14 Education Assessment Education Provided To: Patient Education Topics Provided Wound/Skin Impairment: Methods: Explain/Verbal Responses: Return demonstration correctly Electronic Signature(s) Signed: 05/01/2022 3:55:56 PM By:  [name] RN Entered By: [name] on 05/01/2022 10:23:31 -------------------------------------------------------------------------------- Wound Assessment Details Patient Name: Date of Service: [name] 05/01/2022 9:30 A M Medical Record Number: [id number] Patient Account Number: [id number] Date of Birth/Sex: Treating RN: [birth year] (67 y.o. [name] Primary Care Nikkita Adeyemi: [name] Other Clinician: Referring Halla Chopp: Treating Zosia Lucchese/Extender: Alcario Drought in Treatment: 14 Wound Status Wound Number: 7 Primary Lymphedema Etiology: Wound Location: Left, Anterior Lower Leg Wound Open Wounding Event: Blister Status: Date Acquired: 01/22/2022 Comorbid Cataracts, Lymphedema, Hypertension, Peripheral Venous Weeks Of Treatment: 14 History: Disease, Osteoarthritis Clustered Wound: No Wound Measurements Length: (cm) 0.9 Width: (cm) 1 Depth: (cm) 0.2 Area: (cm) 0.707 Volume: (  cm) 0.141 % Reduction in Area: 77.3% % Reduction in Volume: 77.3% Epithelialization: Small (1-33%) Tunneling: No Undermining: No Wound Description Classification: Full Thickness Without Exposed Suppor Wound Margin: Distinct, outline attached Exudate Amount: Medium Exudate Type: Serosanguineous Exudate Color: red, brown t Structures Foul Odor After Cleansing: No Slough/Fibrino Yes Wound Bed Granulation Amount: Large (67-100%) Exposed Structure Granulation Quality: Pink Fascia Exposed: No Necrotic Amount: Small (1-33%) Fat Layer (Subcutaneous Tissue) ExposedMAHLAYA, [name] ([id number]) [phone].pdf Page 3 of 4 Necrotic Quality: Adherent Slough Tendon Exposed: No Muscle Exposed: No Joint Exposed: No Bone Exposed: No Periwound Skin Texture Texture Color No Abnormalities Noted: Yes No Abnormalities Noted: No Erythema: No Moisture Hemosiderin Staining: Yes No Abnormalities Noted: No Dry / Scaly: Yes  Temperature / Pain Maceration: Yes Temperature: No Abnormality Treatment Notes Wound #7 (Lower Leg) Wound Laterality: Left, Anterior Cleanser Soap and Water Discharge Instruction: May shower and wash wound with dial antibacterial soap and water prior to dressing change. Wound Cleanser Discharge Instruction: Cleanse the wound with wound cleanser prior to applying a clean dressing using gauze sponges, not tissue or cotton balls. Peri-Wound Care Ketoconazole Cream 2% Discharge Instruction: Apply Ketoconazole as directed Triamcinolone 15 (g) Discharge Instruction: Use triamcinolone 15 (g) as directed Zinc Oxide Ointment 30g tube Discharge Instruction: Apply Zinc Oxide to periwound with each dressing change Sween Lotion (Moisturizing lotion) Discharge Instruction: Apply moisturizing lotion as directed Topical Mupirocin Ointment Discharge Instruction: Apply Mupirocin (Bactroban) as instructed Primary Dressing Sorbalgon AG Dressing, 4x4 (in/in) Discharge Instruction: Apply to wound bed as instructed Secondary Dressing Woven Gauze Sponge, Non-Sterile 4x4 in Discharge Instruction: Apply over primary dressing as directed. Zetuvit Plus 4x8 in Discharge Instruction: Apply over primary dressing as directed. Secured With Compression Wrap FourPress (4 layer compression wrap) Discharge Instruction: Apply four layer compression as directed. May also use Urgo K2 compression system as alternative. Netting #5 Compression Stockings Add-Ons Electronic Signature(s) Signed: 05/01/2022 3:55:56 PM By: [name] RN Entered By: [name] on 05/01/2022 10:22:45 -------------------------------------------------------------------------------- Vitals Details Patient Name: Date of Service: Erin Gerold NITA [name] 05/01/2022 9:30 A M Medical Record Number: [id number] Patient Account Number: [id number] Date of Birth/Sex: Treating RN: 04/23/1955 (67 y.o. [name] Primary Care Catha Ontko:  [name] Other Clinician: Referring [place]: Treating Italo Banton/Extender: [name], [name] ([id number]) [phone].pdf Page 4 of 4 Weeks in Treatment: 14 Vital Signs Time Taken: 10:23 Temperature (F): 98.2 Height (in): 62 Pulse (bpm): 73 Weight (lbs): 222 Respiratory Rate (breaths/min): 16 Body Mass Index (BMI): 40.6 Blood Pressure (mmHg): 124/68 Reference Range: 80 - 120 mg / dl Electronic Signature(s) Signed: 05/01/2022 3:55:56 PM By: [name] RN Entered By: [name] on 05/01/2022 10:22:21

## 2022-05-02 NOTE — Progress Notes (Signed)
SHERRICE, KAMINSKI ([id number]) [id number] Page 1 of 1 Visit Report for 05/01/2022 SuperBill Details Patient Name: Date of Service: [name], [name] 05/01/2022 Medical Record Number: [id number] Patient Account Number: [id number] Date of Birth/Sex: Treating RN: 1955-07-04 (67 y.o. 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

## 2022-05-08 ENCOUNTER — Encounter (HOSPITAL_BASED_OUTPATIENT_CLINIC_OR_DEPARTMENT_OTHER): Payer: Commercial Managed Care - PPO | Admitting: General Surgery

## 2022-05-08 DIAGNOSIS — M1990 Unspecified osteoarthritis, unspecified site: Secondary | ICD-10-CM | POA: Diagnosis not present

## 2022-05-08 DIAGNOSIS — L97928 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: Secondary | ICD-10-CM | POA: Diagnosis not present

## 2022-05-08 DIAGNOSIS — E039 Hypothyroidism, unspecified: Secondary | ICD-10-CM | POA: Diagnosis not present

## 2022-05-08 DIAGNOSIS — I872 Venous insufficiency (chronic) (peripheral): Secondary | ICD-10-CM | POA: Diagnosis not present

## 2022-05-08 DIAGNOSIS — E785 Hyperlipidemia, unspecified: Secondary | ICD-10-CM | POA: Diagnosis not present

## 2022-05-08 DIAGNOSIS — I739 Peripheral vascular disease, unspecified: Secondary | ICD-10-CM | POA: Diagnosis not present

## 2022-05-08 DIAGNOSIS — I890 Lymphedema, not elsewhere classified: Secondary | ICD-10-CM | POA: Diagnosis not present

## 2022-05-08 DIAGNOSIS — I10 Essential (primary) hypertension: Secondary | ICD-10-CM | POA: Diagnosis not present

## 2022-05-09 NOTE — Progress Notes (Signed)
TENAJA, OHLE ([id number]) [id number] Page 1 of 7 Visit Report for 05/08/2022 Arrival Information Details Patient Name: Date of Service: [name], [name] 05/08/2022 9:30 A M Medical Record Number: [id number] Patient Account Number: [id number] Date of Birth/Sex: Treating RN: February 04, 1956 (67 y.o. [name] Primary Care Saket Hellstrom: [name] Other Clinician: Referring Dail Lerew: Treating Keiffer Piper/Extender: [name] in Treatment: 15 Visit Information History Since Last Visit Added or deleted any medications: No Patient Arrived: [name] Any new allergies or adverse reactions: No Arrival Time: 09:49 Had a fall or experienced change in No Accompanied By: self activities of daily living that may affect Transfer Assistance: Manual risk of falls: Patient Requires Transmission-Based Precautions: No Signs or symptoms of abuse/neglect since last visito No Patient Has Alerts: No Hospitalized since last visit: No Implantable device outside of the clinic excluding No cellular tissue based products placed in the center since last visit: Has Dressing in Place as Prescribed: Yes Has Compression in Place as Prescribed: Yes Pain Present Now: No Electronic Signature(s) Signed: 05/08/2022 5:11:51 PM By: [name] RN Entered By: [name] on 05/08/2022 10:01:50 -------------------------------------------------------------------------------- Compression Therapy Details Patient Name: Date of Service: Erin Face [name] 05/08/2022 9:30 A M Medical Record Number: [id number] Patient Account Number: [id number] Date of Birth/Sex: Treating RN: 16-Jun-1955 (67 y.o. [name] Primary Care Florentino Laabs: [name] Other Clinician: Referring Natale Thoma: Treating Nilah Belcourt/Extender: [name] in Treatment: 15 Compression Therapy Performed for Wound Assessment: Wound #7 Left,Anterior Lower  Leg Performed By: Clinician [name], RN Compression Type: Four Layer Post Procedure Diagnosis Same as Pre-procedure Electronic Signature(s) Signed: 05/08/2022 5:13:24 PM By: [name] RN Entered By: [name] on 05/08/2022 17:12:57 -------------------------------------------------------------------------------- Encounter Discharge Information Details Patient Name: Date of Service: Erin Face [name] 05/08/2022 9:30 A M Medical Record Number: [id number] Patient Account Number: [id number] Date of Birth/Sex: Treating RN: 03-23-1955 (67 y.o. [name] Primary Care Lametria Klunk: [name] Other Clinician: Referring Alyah Boehning: Treating Blaire Hodsdon/Extender: [name] in Treatment: 15 Encounter Discharge Information Items Post Procedure Vitals Discharge Condition: Stable Temperature (F): 97.7 Ambulatory Status: Cane Pulse (bpm): 70 Discharge Destination: Home Respiratory Rate (breaths/min): 16 Transportation: Private Auto Blood Pressure (mmHg): 130/84 [name], [name] ([id number]) [phone].pdf Page 2 of 7 Accompanied By: self Schedule Follow-up Appointment: Yes Clinical Summary of Care: Patient Declined Electronic Signature(s) Signed: 05/08/2022 5:11:51 PM By: [name] RN Entered By: [name] on 05/08/2022 17:11:23 -------------------------------------------------------------------------------- Lower Extremity Assessment Details Patient Name: Date of Service: [name], [name] 05/08/2022 9:30 A M Medical Record Number: [id number] Patient Account Number: [id number] Date of Birth/Sex: Treating RN: [birth year] (67 y.o. [name] Primary Care Vong Garringer: [name] Other Clinician: Referring Autrey Human: Treating Mica Releford/Extender: [name] in Treatment: 15 Edema Assessment Assessed: [Left: No] [Right: No] [Left: Edema] [Right: :] Calf Left:  Right: Point of Measurement: From Medial Instep 35 cm Ankle Left: Right: Point of Measurement: From Medial Instep 23.9 cm Vascular Assessment Pulses: Dorsalis Pedis Palpable: [Left:Yes] Electronic Signature(s) Signed: 05/08/2022 5:11:51 PM By: [name] RN Entered By: [name] on 05/08/2022 10:02:38 -------------------------------------------------------------------------------- Multi Wound Chart Details Patient Name: Date of Service: Erin Face [name] 05/08/2022 9:30 A M Medical Record Number: [id number] Patient Account Number: [id number] Date of Birth/Sex: Treating RN: 12/13/1955 (67 y.o. F) Primary Care Marah Park: [name] Other Clinician: Referring Zeffie Bickert: Treating Conan Mcmanaway/Extender: [name] in Treatment: 15 Vital Signs Height(in): 62 Pulse(bpm): 70 Weight(lbs): [id number] Blood Pressure(mmHg): 130/84  Body Mass Index(BMI): 40.6 Temperature(F): 97.7 Respiratory Rate(breaths/min): 16 [7:Photos:] [N/A:N/A] Left, Anterior Lower Leg N/A N/A Wound Location: Blister N/A N/A Wounding Event: Lymphedema N/A N/A Primary Etiology: Cataracts, Lymphedema, N/A N/A Comorbid History: Hypertension, Peripheral Venous Disease, Osteoarthritis 01/22/2022 N/A N/A Date Acquired: 15 N/A N/A Weeks of Treatment: Open N/A N/A Wound Status: No N/A N/A Wound Recurrence: 0.5x0.6x0.2 N/A N/A Measurements L x W x [name] 0.236 N/A N/A A (cm) : rea 0.047 N/A N/A Volume (cm) : 92.40% N/A N/A % Reduction in A rea: 92.40% N/A N/A % Reduction in Volume: Full Thickness Without Exposed N/A N/A Classification: Support Structures Medium N/A N/A Exudate A mount: Serosanguineous N/A N/A Exudate Type: red, brown N/A N/A Exudate Color: Distinct, outline attached N/A N/A Wound Margin: Large (67-100%) N/A N/A Granulation A mount: Pink N/A N/A Granulation Quality: Small (1-33%) N/A N/A Necrotic A mount: Fat Layer (Subcutaneous Tissue): Yes  N/A N/A Exposed Structures: Fascia: No Tendon: No Muscle: No Joint: No Bone: No Small (1-33%) N/A N/A Epithelialization: Debridement - Selective/Open Wound N/A N/A Debridement: Pre-procedure Verification/Time Out 10:18 N/A N/A Taken: Lidocaine 5% topical ointment N/A N/A Pain Control: Necrotic/Eschar, Slough N/A N/A Tissue Debrided: Non-Viable Tissue N/A N/A Level: 0.4 N/A N/A Debridement A (sq cm): rea Curette N/A N/A Instrument: Minimum N/A N/A Bleeding: Pressure N/A N/A Hemostasis A chieved: 0 N/A N/A Procedural Pain: Procedure was tolerated well N/A N/A Debridement Treatment Response: 0.5x0.6x0.2 N/A N/A Post Debridement Measurements L x W x [name] 0.047 N/A N/A Post Debridement Volume: (cm) No Abnormalities Noted N/A N/A Periwound Skin Texture: Maceration: Yes N/A N/A Periwound Skin Moisture: Dry/Scaly: Yes Hemosiderin Staining: Yes N/A N/A Periwound Skin Color: Erythema: No No Abnormality N/A N/A Temperature: Debridement N/A N/A Procedures Performed: Treatment Notes Electronic Signature(s) Signed: 05/08/2022 10:34:19 AM By: [name] MD FACS Entered By: [name] on 05/08/2022 10:34:18 -------------------------------------------------------------------------------- Multi-Disciplinary Care Plan Details Patient Name: Date of Service: Erin Face [name] 05/08/2022 9:30 A M Medical Record Number: [id number] Patient Account Number: [id number] Date of Birth/Sex: Treating RN: [birth year] (67 y.o. [name] Primary Care Arlander Gillen: [name] Other Clinician: Referring Reighlynn Swiney: Treating Elmon Shader/Extender: [name] in Treatment: 15 Active Inactive Abuse / Safety / Falls / Self Care Management [name], [name] ([id number]) [id number] Page 4 of 7 Nursing Diagnoses: History of Falls Impaired physical mobility Goals: Patient/caregiver will identify factors that restrict  self-care and home management Date Initiated: 01/22/2022 Target Resolution Date: 09/11/2022 Goal Status: Active Interventions: Assess fall risk on admission and as needed Assess: immobility, friction, shearing, incontinence upon admission and as needed Notes: Wound/Skin Impairment Nursing Diagnoses: Impaired tissue integrity Knowledge deficit related to ulceration/compromised skin integrity Goals: Patient/caregiver will verbalize understanding of skin care regimen Date Initiated: 01/22/2022 Target Resolution Date: 09/11/2022 Goal Status: Active Interventions: Assess ulceration(s) every visit Treatment Activities: Skin care regimen initiated : 01/22/2022 Topical wound management initiated : 01/22/2022 Notes: Electronic Signature(s) Signed: 05/08/2022 5:11:51 PM By: [name] RN Entered By: [name] on 05/08/2022 17:08:01 -------------------------------------------------------------------------------- Pain Assessment Details Patient Name: Date of Service: [name], [name] 05/08/2022 9:30 A M Medical Record Number: [id number] Patient Account Number: [id number] Date of Birth/Sex: Treating RN: 1956-01-21 (67 y.o. [name] Primary Care Trysten Berti: [name] Other Clinician: Referring Tameia Rafferty: Treating Martasia Talamante/Extender: [name] in Treatment: 15 Active Problems Location of Pain Severity and Description of Pain Patient Has Paino No Site Locations Pain Management and Medication Current Pain Management: [name], [name] ([id number]) [phone].pdf  Page 5 of 7 Electronic Signature(s) Signed: 05/08/2022 5:11:51 PM By: [name] RN Entered By: [name] on 05/08/2022 10:02:22 -------------------------------------------------------------------------------- Patient/Caregiver Education Details Patient Name: Date of Service: [name] 3/26/2024andnbsp9:30 Anderson Record Number:  [id number] Patient Account Number: [id number] Date of Birth/Gender: Treating RN: [birth year] (67 y.o. [name] Primary Care Physician: [name] Other Clinician: Referring Physician: Treating Physician/Extender: [name] in Treatment: 15 Education Assessment Education Provided To: Patient Education Topics Provided Wound/Skin Impairment: Methods: Explain/Verbal Responses: Return demonstration correctly Electronic Signature(s) Signed: 05/08/2022 5:11:51 PM By: [name] RN Entered By: [name] on 05/08/2022 17:08:16 -------------------------------------------------------------------------------- Wound Assessment Details Patient Name: Date of Service: [name], [name] 05/08/2022 9:30 A M Medical Record Number: [id number] Patient Account Number: [id number] Date of Birth/Sex: Treating RN: [birth year] (67 y.o. [name] Primary Care Jamarrius Salay: [name] Other Clinician: Referring Janiaya Ryser: Treating Kapri Nero/Extender: [name] in Treatment: 15 Wound Status Wound Number: 7 Primary Lymphedema Etiology: Wound Location: Left, Anterior Lower Leg Wound Open Wounding Event: Blister Status: Date Acquired: 01/22/2022 Comorbid Cataracts, Lymphedema, Hypertension, Peripheral Venous Weeks Of Treatment: 15 History: Disease, Osteoarthritis Clustered Wound: No Photos Wound Measurements Length: (cm) 0.5 Width: (cm) 0.6 Depth: (cm) 0.2 [name], [name] ([id number]) Area: (cm) 0.236 Volume: (cm) 0.047 % Reduction in Area: 92.4% % Reduction in Volume: 92.4% Epithelialization: Small (1-33%) [phone].pdf Page 6 of 7 Tunneling: No Undermining: No Wound Description Classification: Full Thickness Without Exposed Support Structures Wound Margin: Distinct, outline attached Exudate Amount: Medium Exudate Type: Serosanguineous Exudate Color: red, brown Foul Odor After  Cleansing: No Slough/Fibrino Yes Wound Bed Granulation Amount: Large (67-100%) Exposed Structure Granulation Quality: Pink Fascia Exposed: No Necrotic Amount: Small (1-33%) Fat Layer (Subcutaneous Tissue) Exposed: Yes Necrotic Quality: Adherent Slough Tendon Exposed: No Muscle Exposed: No Joint Exposed: No Bone Exposed: No Periwound Skin Texture Texture Color No Abnormalities Noted: Yes No Abnormalities Noted: No Erythema: No Moisture Hemosiderin Staining: Yes No Abnormalities Noted: No Dry / Scaly: Yes Temperature / Pain Maceration: Yes Temperature: No Abnormality Treatment Notes Wound #7 (Lower Leg) Wound Laterality: Left, Anterior Cleanser Soap and Water Discharge Instruction: May shower and wash wound with dial antibacterial soap and water prior to dressing change. Wound Cleanser Discharge Instruction: Cleanse the wound with wound cleanser prior to applying a clean dressing using gauze sponges, not tissue or cotton balls. Peri-Wound Care Ketoconazole Cream 2% Discharge Instruction: Apply Ketoconazole as directed Triamcinolone 15 (g) Discharge Instruction: Use triamcinolone 15 (g) as directed Zinc Oxide Ointment 30g tube Discharge Instruction: Apply Zinc Oxide to periwound with each dressing change Sween Lotion (Moisturizing lotion) Discharge Instruction: Apply moisturizing lotion as directed Topical Mupirocin Ointment Discharge Instruction: Apply Mupirocin (Bactroban) as instructed Primary Dressing Sorbalgon AG Dressing, 4x4 (in/in) Discharge Instruction: Apply to wound bed as instructed Secondary Dressing Woven Gauze Sponge, Non-Sterile 4x4 in Discharge Instruction: Apply over primary dressing as directed. Zetuvit Plus 4x8 in Discharge Instruction: Apply over primary dressing as directed. Secured With Compression Wrap FourPress (4 layer compression wrap) Discharge Instruction: Apply four layer compression as directed. May also use Urgo K2 compression system  as alternative. Netting #5 Compression Stockings Add-Ons JANETT, [name] ([id number]) [id number] Page 7 of 7 Electronic Signature(s) Signed: 05/08/2022 5:11:51 PM By: [name] RN Entered By: [name] on 05/08/2022 10:04:05 -------------------------------------------------------------------------------- Vitals Details Patient Name: Date of Service: Erin Gerold NITA [name] 05/08/2022 9:30 A M Medical Record Number: [id number] Patient Account Number: [id number] Date of Birth/Sex: Treating RN: [birth year] (  67 y.o. [name] Primary Care Aaliah Jorgenson: [name] Other Clinician: Referring Haylie Mccutcheon: Treating Audelia Knape/Extender: [name] in Treatment: 15 Vital Signs Time Taken: 10:01 Temperature (F): 97.7 Height (in): 62 Pulse (bpm): 70 Weight (lbs): 222 Respiratory Rate (breaths/min): 16 Body Mass Index (BMI): 40.6 Blood Pressure (mmHg): 130/84 Reference Range: 80 - 120 mg / dl Electronic Signature(s) Signed: 05/08/2022 5:11:51 PM By: [name] RN Entered By: [name] on 05/08/2022 10:02:16

## 2022-05-09 NOTE — Progress Notes (Signed)
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 Week/30 Days Discharge Instructions: Apply Ketoconazole as directed Peri-Wound Care: Triamcinolone 15 (g) 1 x Per Week/30 Days Discharge Instructions: Use triamcinolone 15 (g) as directed Peri-Wound Care: Zinc Oxide Ointment 30g tube 1 x Per Week/30 Days Discharge Instructions: Apply Zinc Oxide to periwound with each dressing change Peri-Wound Care: Sween Lotion (Moisturizing lotion) 1 x Per Week/30 Days Discharge Instructions: Apply moisturizing lotion as directed Topical: Mupirocin Ointment 1 x Per Week/30 Days Discharge Instructions: Apply Mupirocin (Bactroban) as instructed Prim Dressing: Sorbalgon AG Dressing, 4x4 (in/in) 1 x Per Week/30 Days ary Discharge Instructions: Apply to wound bed as instructed Secondary Dressing: Woven Gauze Sponge, Non-Sterile 4x4 in 1 x Per Week/30 Days Discharge Instructions: Apply over primary dressing as directed. Secondary Dressing: Zetuvit Plus 4x8 in 1 x Per Week/30 Days Discharge Instructions: Apply over primary dressing as directed. Compression Wrap: FourPress (4 layer  compression wrap) 1 x Per Week/30 Days Discharge Instructions: Apply four layer compression as directed. May also use Urgo K2 compression system as alternative. NYKERIA, RADUENZ ([id number]) [id number] Page 6 of 12 Compression Wrap: Netting #5 1 x Per Week/30 Days Electronic Signature(s) Signed: 05/08/2022 10:52:39 AM By: [name] MD FACS Entered By: [name] on 05/08/2022 10:35:51 -------------------------------------------------------------------------------- Problem List Details Patient Name: Date of Service: [name] 05/08/2022 9:30 A M Medical Record Number: [id number] Patient Account Number: [id number] Date of Birth/Sex: Treating RN: 1955-04-10 (67 y.o. 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 05/08/2022 9:30 A M Medical Record Number: [id number] Patient Account Number: [id number] Date of Birth/Sex: Treating RN: May 20, 1955 (67 y.o. F) Primary Care Provider: [name] Other Clinician: Referring Provider: Treating Provider/Extender: [name] in Treatment: 15 Subjective Chief Complaint Information obtained from Patient 04/19/2021: The patient  is here for  ongoing follow-up regarding 2 left lower extremity wounds. 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 She tells [name] that she fell on the gravel driveway in July. She developed injuries on her distal lower leg which have not healed. She saw her primary physician on 11/15/2017 who noted her left shin injuries. Gave her antibiotics. At that point the wounds were almost circumferential however most were less than 1.5 cm. Weeping edema fluid was noted. She was referred here for evaluation. The patient has a history of chronic lower extremity edema. 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 She has had prior history with leg weeping fluid. She does not wear compression stockings. She is not doing anything specific to these wound areas. The patient has a history of obesity, arthritis, peripheral vascular disease hypertension lower extremity edema and Schamberg's disease SHARESE, LODES [name] ([id number]) [id number] Page 7 of 12 ABI in our clinic was 1.3 on the left 12/17/2017; patient readmitted to the clinic last week. 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 She also has lymphedema. Put her in 3 layer compression and silver alginate last week. She has 3 small wounds with depth just lateral to the tibia. More problematically than this she has numerous shallow areas some of which are almost canal like in shape with tightly adherent painful debris. It would be very difficult and time- consuming to go through this and attempt to individually debride all these areas.. I changed her to collagen today to see if that would help with any of  the surface debris on some of these wounds. Otherwise we will not be able to put this in compression we have to have someone change the dressing. The patient is not eligible for home health 12/25/17 on evaluation today patient actually appears to be doing rather well in regard to the ulcer on her lower extremity. Fortunately there does not appear to be evidence of infection at this time. She has been tolerating the dressing changes without complication. This includes the compression wrap. The only issue she had was that the wrap was initially placed over her bunion region which actually calls her some discomfort and pain. Other than that things seem to be going rather well. 01/01/2018 Seen today for follow-up and management of left lower extremity wound and lymphedema. T oday she presents with a new wound towards to the left lateral LE. Recently treated with a 7 day course of amoxicillin; reason for antibiotic dose is unknown at this time. Tolerating current treatment of collagen with 4- layer wraps. She obtained a venous reflux study on 12/25/17. Studies show on the right abnormal reflux times of the popliteal vein, great saphenous vein at the saphenofemoral junction at the proximal thigh, great saphenous vein at the mid calf, and origin of the small saphenous vein.No superficial thrombosis. No deep vein thrombosis in the common femoral, femoral,and popliteal veins. Left abnormal reflex times as well of the common femoral vein, popliteal vein, and a great saphenous vein at the saphenofemoral junction, In great saphenous vein at the mid thigh w/o thrombosis. Has any issues or concerns during visit today. Recommended follow-up to vascular specialist due to abnormalities from the venous  reflux study. Denies fever, pain, chills, dizziness, nausea, or vomiting. 01/08/18 upon evaluation today the patient actually seems to be showing some signs of improvement in my opinion at this point in regard to the  lower extremity ulcerated areas. She still has a lot of drainage but fortunately nothing that appears to be too significant currently. I have been very happy with the overall progress I see today compared to where things were during the last evaluation that I had with her. Nonetheless she has not had her appointment with the vein specialist as of yet in fact we were able to get this approved for her today and confirmed with them she will be seeing them on December 26. Nonetheless in general I do feel like the compression wraps is doing well for her. 01/17/18; quite a bit of improvement since last time I saw this patient she has a small open area remaining on the left lateral calf and even smaller area medially. She has lymphedema chronic stasis changes with distal skin fibrosis. She has an appointment with vascular surgery later this month 01/24/2018; the patient's medial leg has closed. Still a small open area on the left lateral leg. She states that the 4 layer compression we put on last week was too tight and she had to take it off over a few days ago. We have had resultant increase in her lymphedema in the dorsal foot and a proximal calf. Fortunately that does not seem to have resulted in any deterioration in her wounds The patient is going to need compression stockings. We have given her measurements to phone elastic therapy in [place]. She has vascular surgery consult on December 26 02/07/18; she's had continuous contraction on the left lateral leg wound which is now very small. Currently using silver alginate under 3 layer compression She saw Dr. Trula Slade of vascular surgery on 02/06/18. It was noted that she had a normal reflux times in the popliteal vein, great saphenous vein at the saphenofemoral junction, great saphenous vein at the proximal thigh great saphenous vein at the mid calf and origin of the small saphenous vein. It was noted that she had significant reflux in the left saphenous  veins with diameter measurements in the 0.7-0.8 cm range. It was felt she would benefit from laser ablation to help minimize the risk of ulcer recurrence. It was recommended that she wear 20-30 thigh-high compression stockings and have follow-up in 4-6 weeks 02/17/2018; I thought this lady would be healed however her compression slipped down and she developed increasing swelling and the wound is actually larger. 1/13; we had deterioration last week after the patient's compression slipped down and she developed periwound swelling. I increased her compression before layers we have been using silver alginate we are a lot better again today. She has her compression stockings in waiting 1/23; the patient's wounds are totally healed today. She has her stockings. This was almost circumferential skin damage. She follows up with Dr. Trula Slade of vascular surgery next Monday. READMISSION 02/21/2021 This is a now 67 year old woman that we had in clinic here discharging in January 2020 with wounds on her left calf chronic venous insufficiency. She was discharged with 30/40 stockings. It does not sound like she has worn stockings in about a year largely from not being able to get them on herself. In November she developed new blisters on her legs an area laterally is opened into a fairly sizable wound. She has weeping posteriorly as well. She has been using Neosporin and  Band-Aids. The patient did see Dr. Trula Slade in 2019 and 2020. He felt she might benefit from laser ablation in her left saphenous veins although because of the wound that healed I do not think he went through with it. She might benefit from seeing him again. Her ABI on the left is 1. 1/17; patient's wound on the posterior left calf is closed she has the 2 large areas last week. We put her in 4-layer compression for edema control is a lot better. Our intake nurse noted greenish drainage and odor. We have been using silver alginate 1/25; PCR culture I  did have the substantial wound area on the left lateral lower leg showed staff aureus and group A strep. Low titers of coag negative staph which are probably skin contaminants. Resistance detected to tetracycline methicillin and macrolides. I gave her a starter kit of Nuzyra 150 mg x 3 for 2 days then 300 mg for a further 7 days. Marked odor considerable increase in surrounding erythema. We are using Iodoflex last week however I have changed to silver alginate with underlying Bactroban 2/1; she is completing her [place] tomorrow. The degree of erythema around the wounds looks a lot better. Odor has improved. I gave her silver alginate and Bactroban last week and changing her back to Iodoflex to continue with ongoing debridement 2/8; the periwound looks a lot better. Surface of the wound also looks somewhat better although there is still ongoing debridement to be done we have been using Iodoflex under compression. Primary dressing and silver alginate 2/15; left posterior calf. Some improvement in the surface of the wound but still very gritty we have been using Iodoflex under compression. 04/05/2021: Left lateral posterior calf. She continues to have significant amounts of drainage, some of which is probably comprised of the Iodoflex microbleeds. There is no odor to the drainage. The satellite lesion on the more posterior aspect of the calf is epithelializing nicely and has contracted quite a bit. There is robust granulation tissue in the dominant wound with minimal adherent slough. 04/12/2021: The satellite lesion has nearly closed. She continues to have good granulation tissue with minimal slough of the larger primary wound. She continues to have a fair amount of drainage, but I think this is less secondary to switching her dressing from Iodoflex to Prisma. 04/19/2021: The satellite lesion is almost completely epithelialized. The larger primary wound continues to contract with good granulation tissue and  minimal slough. She is currently in [place] with compression. 04/26/2021: The satellite lesion has closed. The larger primary wound has contracted further and the granulation tissue is robust without being hypertrophic. Minimal slough is present. 05/03/2021: The satellite lesion remains closed. The larger primary wound has a bit of slough, but has also contracted further with good perimeter epithelialization. Good granulation tissue at the wound surface. There was some greenish drainage on the dressing when it was removed; it is not the blue-green typically associated with Pseudomonas aeruginosa, however. KANAYA, DIBONA ([id number]) [id number] Page 8 of 12 05/10/2021: The primary wound continues to contract. There is minimal slough. The granulation tissue at 9:00 is a bit hypertrophic. No significant drainage. No odor. 05/17/2021: The wound is a little bit smaller today with good granulation tissue. Minimal slough. No significant drainage or odor. 05/24/2021: The wound continues to contract and has a nice base of granulation tissue. Small amount of slough. No concern for infection. 05/31/2021: For some reason, the wound measured slightly larger today but overall it still appears to be in  good condition with a nice base of granulation tissue and minimal slough. 06/07/2021: The wound is smaller today. Good granulation tissue and minimal slough. 06/14/2021: The wound is unchanged in size. She continues to accumulate some slough. Good granulation tissue on the surface. 06/20/2021: The wound is smaller today. Minimal slough with good granulation tissue. 06/27/2021: The wound on her left lateral leg is smaller today with just a bit of slough and eschar accumulation. Unfortunately, she has opened a new superficial wound on her right lower extremity. She has 3+ pitting edema to the knees on that leg and she does not wear compression stockings. 07/04/2021: In addition to the superficial  wound on her right lower extremity that she opened up last week, she has opened 3 additional sites on the same leg. Her compression wraps were clearly not in correct position when she came to clinic today as they were at about the mid calf level rather than to the tibial tuberosity. She says that they slipped earlier and she had to come back on Friday to have them redone. The wound on her left lateral leg is perhaps slightly larger with some slough accumulation. 07/11/2021: The superficial wounds on her right lower extremity are nearly closed. They just have a thin layer of eschar overlying them. The wound on her left lateral leg is a little bit shallower and has a bit of slough accumulation. 07/17/2021: The right medial lower extremity leg wounds have almost completely closed; one of them just has a tiny opening with a little bit of serous drainage. The left lateral leg wound is really unchanged. It seems to be stalled. 07/24/2021: The right leg wounds are completely closed. The left lateral leg wound is actually bigger today. It is a bit more tender. There is a minor accumulation of slough on the surface. 07/31/2021: The culture that I took last week was positive for MRSA. We added mupirocin under the silver alginate and I prescribed doxycycline. She has been tolerating this well. The wound is smaller today but does have slough accumulation. No significant pain or drainage. 08/07/2021: She completed her course of doxycycline. The wound looks much better today. It is smaller and the periwound is less inflamed. She does have some slough accumulation on the surface. 08/14/2021: The wound continues to contract. There is slough on the wound surface, but the periwound is intact without inflammation or induration. 08/21/2021: The wound is down to just 2 small open sites. They both have a bit of slough accumulation. 08/28/2021: The wound is down to just 1 small open site with a little bit of slough and eschar  accumulation. 09/04/2021: The wound continues to contract but remains open. It is clean without any slough. 09/12/2021: No real change in the overall wound dimensions, but it is flush with the surrounding skin. There is a little bit of slough accumulation. Edema control is good. 09/22/2021: The wound is smaller and even more superficial. Minimal slough accumulation. Good control of edema. 09/29/2021: The wound continues to contract. She reports that it was a little bit "twingey" during the week. It is a little bit dry on inspection today. Light accumulation of slough. Good edema control. 10/06/2021: The wound is about the same size today. There is a little slough on the surface. Edema control is good. 10/13/2021: The wound is about the same size but more superficial. A little bit of slough on the surface. 10/23/2021: The wound is slightly smaller and continues to fill in. Minimal slough on the wound surface.  10/30/2021: The wound continues to contract but has not yet closed. Light slough and eschar present. 11/06/2021: The wound persists, but seems a little bit more epithelialized. There is still slough and eschar accumulation. 11/14/2021: The wound continues to contract but persists. Light eschar around the wound. 11/22/2021: The wound is down to just a pinhole opening. There is eschar overlying the surface. Edema control is excellent. 11/29/2021: Her wound is closed. READMISSION 01/22/2022 This is a patient to be discharged in October. She has severe chronic venous insufficiency at that time she had a wound on her left lower leg posteriorly also the right leg at some point. These closed. We discharged her in 30/40 mm stockings from elastic therapy which she has been wearing religiously. She tells [name] that she was traumatized by a dolly while shopping at [employer] about 2 to 3 weeks ago. She has been left with a left anterior lower leg wound. She comes in in another pair of stockings other than her 3040s. She  does not have an arterial issue with her last ABI in the left at 1.2 12/18; this is a patient who has chronic venous insufficiency and recurrent venous insufficiency ulcers. She has a area on her left anterior lower leg and we readmitted her to the clinic last week. We use silver alginate and 4-layer compression. ABI was 1.2 She brought in her stocking which was a 20/30 below-knee stocking from elastic therapy. She may require a 30/40 mm equivalent stockings after this wound heals. 02/06/2022: The intake nurse reported an odor coming from the wound when she first unwrapped it but this abated after the leg was washed. There is thick layer of slough on the surface. 02/13/2022: The wound is cleaner today. It measured larger, but on visual inspection, I think some crust of Iodoflex was included in the wound measurements; the actual wound itself looks about the same to slightly smaller. Edema control is good. METZI, DHANANI ([id number]) [id number] Page 9 of 12 02/20/2022: The wound is cleaner again today. It is also measuring a little bit smaller, but there has been some moisture related periwound breakdown. Edema control is good. 02/27/2022: The intake nurse reported that the wound measures larger, but some of this ended up being crusted on Iodoflex. There is still some periwound moisture. Still with thick slough accumulation. Edema control is good. 03/06/2022: The wound is cleaner and more superficial, but did measure larger because of the inclusion of a satellite area. Still with some slough accumulation but less so than on prior visits. 03/13/2022: The wound measured a little bit smaller today. There is slough and eschar accumulation, as per usual. 03/20/2022: Her wound is larger and deeper today. The entire surface appears nonviable. There is more periwound erythema and threatened tissue breakdown. 03/27/2022: The culture that I took last week was positive for MRSA. We had  empirically applied topical mupirocin to her wound. Today the wound is smaller and cleaner and less painful. There is still a layer of slough on the surface. 04/03/2022: The wound was measured slightly larger today, but appears roughly the same to my eye. There is a layer of slough on the surface. Underneath, the tissue is quite fibrotic. 04/10/2022: The wound is slightly smaller today. There is slough and eschar accumulation. For some reason, her wrap got changed from 4 layers to 3 layers and her edema control is not as good, as a result. 04/17/2022: The return to true 4-layer compression has improved the wound considerably. It is much smaller  and cleaner today. Edema control is excellent. 04/24/2022: Her wound continues to improve. It is smaller with just a bit of slough on the surface. Edema control is excellent. 05/08/2022: Her wound is smaller again today. There is still slough accumulation on the surface. Edema control remains excellent. Patient History Information obtained from Patient. Family History Heart Disease - Mother,Father, Hypertension - Mother,Father, Kidney Disease - Mother, Thyroid Problems - Mother, No family history of Cancer, Diabetes, Hereditary Spherocytosis, Lung Disease, Seizures, Stroke, Tuberculosis. Social History Never smoker, Marital Status - Single, Alcohol Use - Never, Drug Use - No History, Caffeine Use - Daily - coffee. Medical History Eyes Patient has history of Cataracts Hematologic/Lymphatic Patient has history of Lymphedema Cardiovascular Patient has history of Hypertension, Peripheral Venous Disease Integumentary (Skin) Denies history of History of Burn Musculoskeletal Patient has history of Osteoarthritis Hospitalization/Surgery History - colostomy reversal. 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 Medical A Surgical History Notes nd Constitutional Symptoms (General Health) morbid obesity Cardiovascular schamberg disease,  hyperlipidemia Gastrointestinal diverticulitis , h/o obstruction due to diverticulitis , colostomy and colostomy reversal Endocrine hypothyroidism Objective Constitutional no acute distress. Vitals Time Taken: 10:01 AM, Height: 62 in, Weight: 222 lbs, BMI: 40.6, Temperature: 97.7 F, Pulse: 70 bpm, Respiratory Rate: 16 breaths/min, Blood Pressure: 130/84 mmHg. Respiratory Normal work of breathing on room air. General Notes: 05/08/2022: Her wound is smaller again today. There is still slough accumulation on the surface. Edema control remains excellent. Integumentary (Hair, Skin) Wound #7 status is Open. Original cause of wound was Blister. The date acquired was: 01/22/2022. The wound has been in treatment 15 weeks. The wound is located on the Left,Anterior Lower Leg. The wound measures 0.5cm length x 0.6cm width x 0.2cm depth; 0.236cm^2 area and 0.047cm^3 volume. There is Fat MAKINZEY, SIMPKINS ([id number]) [id number] Page 10 of 12 Layer (Subcutaneous Tissue) exposed. There is no tunneling or undermining noted. There is a medium amount of serosanguineous drainage noted. The wound margin is distinct with the outline attached to the wound base. There is large (67-100%) pink granulation within the wound bed. There is a small (1-33%) amount of necrotic tissue within the wound bed including Adherent Slough. The periwound skin appearance had no abnormalities noted for texture. The periwound skin appearance exhibited: Dry/Scaly, Maceration, Hemosiderin Staining. The periwound skin appearance did not exhibit: Erythema. Periwound temperature was noted as No Abnormality. 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 There was a Selective/Open Wound Non-Viable Tissue Debridement with a total area of 0.4 sq cm performed by [name], MD. With the following instrument(s): Curette to remove Non-Viable tissue/material. Material removed includes Eschar and Slough and after achieving pain control using Lidocaine 5% topical ointment. No specimens were taken. A time out was conducted at 10:18, prior to the start of the procedure. A Minimum amount of bleeding was controlled with Pressure. The procedure was tolerated well with a pain level of 0 throughout. Post Debridement Measurements: 0.5cm length x 0.6cm width x 0.2cm depth; 0.047cm^3 volume. Character of Wound/Ulcer Post Debridement is improved. Post procedure Diagnosis Wound #7: Same as Pre-Procedure General Notes: Scribed for Dr. Celine Ahr by J.[name]. Plan Follow-up Appointments: Return Appointment in 1 week. - Dr. Celine Ahr Room 3 Anesthetic: (In clinic) Topical Lidocaine 5% applied to wound bed Bathing/ Shower/ Hygiene: May shower  with protection but do not get wound dressing(s) wet. Protect dressing(s) with water repellant cover (for example, large plastic bag) or a cast cover and may then take shower. - Please do not get the Left lower leg Compression wraps wet. Edema Control - Lymphedema / SCD / Other: Avoid standing for long periods of time. Patient to wear own compression stockings every day. Exercise regularly Moisturize legs daily. WOUND #7: - Lower Leg Wound Laterality: Left, Anterior Cleanser: Soap and Water 1 x Per Week/30 Days Discharge Instructions: May shower and wash wound with dial antibacterial soap and water prior to dressing change. Cleanser: Wound Cleanser 1 x Per Week/30 Days Discharge Instructions: Cleanse the wound with wound cleanser prior to applying a clean dressing using gauze sponges, not tissue or cotton balls. Peri-Wound Care: Ketoconazole Cream 2% 1 x Per Week/30 Days Discharge Instructions: Apply Ketoconazole  as directed Peri-Wound Care: Triamcinolone 15 (g) 1 x Per Week/30 Days Discharge Instructions: Use triamcinolone 15 (g) as directed Peri-Wound Care: Zinc Oxide Ointment 30g tube 1 x Per Week/30 Days Discharge Instructions: Apply Zinc Oxide to periwound with each dressing change Peri-Wound Care: Sween Lotion (Moisturizing lotion) 1 x Per Week/30 Days Discharge Instructions: Apply moisturizing lotion as directed Topical: Mupirocin Ointment 1 x Per Week/30 Days Discharge Instructions: Apply Mupirocin (Bactroban) as instructed Prim Dressing: Sorbalgon AG Dressing, 4x4 (in/in) 1 x Per Week/30 Days ary Discharge Instructions: Apply to wound bed as instructed Secondary Dressing: Woven Gauze Sponge, Non-Sterile 4x4 in 1 x Per Week/30 Days Discharge Instructions: Apply over primary dressing as directed. Secondary Dressing: Zetuvit Plus 4x8 in 1 x Per Week/30 Days Discharge Instructions: Apply over primary dressing as directed. Com pression Wrap: FourPress (4 layer compression wrap) 1 x Per Week/30 Days Discharge Instructions: Apply four layer compression as directed. May also use Urgo K2 compression system as alternative. Com pression Wrap: Netting #5 1 x Per Week/30 Days 05/08/2022: Her wound is smaller again today. There is still slough accumulation on the surface. Edema control remains excellent. I used a curette to debride slough and eschar from the wound. We will continue topical mupirocin with silver alginate and 4-layer compression. She will follow-up in 1 week. [name], LASZEWSKI ([id number]) [id number] Page 11 of 12 Electronic Signature(s) Signed: 05/08/2022 10:36:16 AM By: [name] MD FACS Entered By: [name] on 05/08/2022 10:36:16 -------------------------------------------------------------------------------- HxROS Details Patient Name: Date of Service: [name] 05/08/2022 9:30 A M Medical Record Number: [id number] Patient Account  Number: [id number] Date of Birth/Sex: Treating RN: 10-14-1955 (67 y.o. F) Primary Care Provider: [name] Other Clinician: Referring Provider: Treating Provider/Extender: [name] in Treatment: 15 Information Obtained From Patient Constitutional Symptoms (General Health) Medical History: Past Medical History Notes: morbid obesity Eyes Medical History: Positive for: Cataracts Hematologic/Lymphatic Medical History: Positive for: Lymphedema Cardiovascular Medical History: Positive for: Hypertension; Peripheral Venous Disease Past Medical History Notes: schamberg disease, hyperlipidemia Gastrointestinal Medical History: Past Medical History Notes: diverticulitis , h/o obstruction due to diverticulitis , colostomy and colostomy reversal Endocrine Medical History: Past Medical History Notes: hypothyroidism Integumentary (Skin) Medical History: Negative for: History of Burn Musculoskeletal Medical History: Positive for: Osteoarthritis HBO Extended History Items Eyes: Cataracts Immunizations Pneumococcal Vaccine: Received Pneumococcal Vaccination: Yes Received Pneumococcal Vaccination On or After 60th Birthday: Yes Implantable Devices None [name], [name] ([id number]) [id number] Page 12 of 12 Hospitalization / Surgery History Type of Hospitalization/Surgery colostomy reversal 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 Family and Social History Cancer: No; Diabetes: No; Heart Disease: Yes - Mother,Father;  Hereditary Spherocytosis: No; Hypertension: Yes - Mother,Father; Kidney Disease: Yes - Mother; Lung Disease: No; Seizures: No; Stroke: No; Thyroid Problems: Yes - Mother; Tuberculosis: No; Never smoker; Marital Status - Single; Alcohol Use: Never; Drug Use: No History; Caffeine Use: Daily - coffee; Financial Concerns: No; Food, Clothing or Shelter Needs: No; Support System Lacking: No; Transportation Concerns:  No Electronic Signature(s) Signed: 05/08/2022 10:52:39 AM By: [name] MD FACS Entered By: [name] on 05/08/2022 10:35:13 -------------------------------------------------------------------------------- SuperBill Details Patient Name: Date of Service: [name] 05/08/2022 Medical Record Number: [id number] Patient Account Number: [id number] Date of Birth/Sex: Treating RN: [birth year] (67 y.o. 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 with other specif Modifier: ied severity Quantity: 1 Physician Procedures : CPT4 Code Description Modifier [id number] - WC PHYS LEVEL 4 - EST PT 25 ICD-10 Diagnosis Description L97.928 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

## 2022-05-15 ENCOUNTER — Encounter (HOSPITAL_BASED_OUTPATIENT_CLINIC_OR_DEPARTMENT_OTHER): Payer: Commercial Managed Care - PPO | Attending: General Surgery | Admitting: General Surgery

## 2022-05-15 DIAGNOSIS — L97928 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: Secondary | ICD-10-CM | POA: Diagnosis not present

## 2022-05-15 DIAGNOSIS — I890 Lymphedema, not elsewhere classified: Secondary | ICD-10-CM | POA: Diagnosis not present

## 2022-05-15 DIAGNOSIS — I87312 Chronic venous hypertension (idiopathic) with ulcer of left lower extremity: Secondary | ICD-10-CM | POA: Diagnosis not present

## 2022-05-15 NOTE — Progress Notes (Signed)
AKARI, VINCENTE ([id number]) [id number] Page 1 of 7 Visit Report for 05/15/2022 Arrival Information Details Patient Name: Date of Service: ANSLEE, BOBEK 05/15/2022 9:30 A M Medical Record Number: [id number] Patient Account Number: [id number] Date of Birth/Sex: Treating RN: [birth year] (67 y.o. F) Primary Care Tnia Anglada: [name] Other Clinician: Referring Courtny Bennison: Treating Chayton Murata/Extender: [name] in Treatment: 16 Visit Information History Since Last Visit Added or deleted any medications: No Patient Arrived: Cane Any new allergies or adverse reactions: No Arrival Time: 09:40 Had a fall or experienced change in No Accompanied By: Self activities of daily living that may affect Transfer Assistance: None risk of falls: Patient Identification Verified: Yes Signs or symptoms of abuse/neglect since last visito No Secondary Verification Process Completed: Yes Hospitalized since last visit: No Patient Requires Transmission-Based Precautions: No Implantable device outside of the clinic excluding No Patient Has Alerts: No cellular tissue based products placed in the center since last visit: Has Dressing in Place as Prescribed: Yes Pain Present Now: No Electronic Signature(s) Signed: 05/15/2022 1:02:24 PM By: Sandre Kitty Entered By: Sandre Kitty on 05/15/2022 09:41:00 -------------------------------------------------------------------------------- Compression Therapy Details Patient Name: Date of Service: [name] 05/15/2022 9:30 A M Medical Record Number: [id number] Patient Account Number: [id number] Date of Birth/Sex: Treating RN: 07/02/1955 (67 y.o. [name] Primary Care Taleya Whitcher: [name] Other Clinician: Referring Zhamir Pirro: Treating Ryanne Morand/Extender: [name] in Treatment: 16 Compression Therapy Performed for Wound Assessment: Wound #7 Left,Anterior  Lower Leg Performed By: Clinician [name], RN Compression Type: Four Layer Post Procedure Diagnosis Same as Pre-procedure Notes Or URGO K2 compression Electronic Signature(s) Signed: 05/15/2022 2:55:25 PM By: [name] RN Entered By: [name] on 05/15/2022 10:23:16 -------------------------------------------------------------------------------- Encounter Discharge Information Details Patient Name: Date of Service: [name] 05/15/2022 9:30 A M Medical Record Number: [id number] Patient Account Number: [id number] Date of Birth/Sex: Treating RN: [birth year] (67 y.o. [name] Primary Care Frady Taddeo: [name] Other Clinician: Referring Tong Pieczynski: Treating Shelli Portilla/Extender: [name] in Treatment: [phone] Encounter Discharge Information Items Post Procedure Vitals Discharge Condition: Stable Temperature (F): 98.1 Ambulatory Status: Cane Pulse (bpm): [address] ([id number]) [phone].pdf Page 2 of 7 Discharge Destination: Home Respiratory Rate (breaths/min): 16 Transportation: Private Auto Blood Pressure (mmHg): 138/78 Accompanied By: self Schedule Follow-up Appointment: Yes Clinical Summary of Care: Patient Declined Electronic Signature(s) Signed: 05/15/2022 2:55:25 PM By: [name] RN Entered By: [name] on 05/15/2022 14:55:02 -------------------------------------------------------------------------------- Lower Extremity Assessment Details Patient Name: Date of Service: KESHAY, VELEY 05/15/2022 9:30 A M Medical Record Number: [id number] Patient Account Number: [id number] Date of Birth/Sex: Treating RN: [birth year] (67 y.o. [name] Primary Care Lannette Avellino: [name] Other Clinician: Referring Susie Pousson: Treating Brent Noto/Extender: [name] in Treatment: 16 Edema Assessment Assessed: [Left: No] [Right: No] [Left: Edema]  [Right: :] Calf Left: Right: Point of Measurement: From Medial Instep 36.9 cm Ankle Left: Right: Point of Measurement: From Medial Instep 24 cm Vascular Assessment Pulses: Dorsalis Pedis Palpable: [Left:Yes] Electronic Signature(s) Signed: 05/15/2022 2:55:25 PM By: [name] RN Entered By: [name] on 05/15/2022 09:57:59 -------------------------------------------------------------------------------- Multi Wound Chart Details Patient Name: Date of Service: [name] 05/15/2022 9:30 A M Medical Record Number: [id number] Patient Account Number: [id number] Date of Birth/Sex: Treating RN: 10-27-1955 (67 y.o. F) Primary Care Mimie Goering: [name] Other Clinician: Referring Linc Renne: Treating Wenceslaus Gist/Extender: [name] in Treatment: 16 Vital Signs Height(in): 62 Pulse(bpm): 108 Weight(lbs):  222 Blood Pressure(mmHg): 138/78 Body Mass Index(BMI): 40.6 Temperature(F): 98.1 Respiratory Rate(breaths/min): 16 [7:Photos:] [N/A:N/A] Left, Anterior Lower Leg N/A N/A Wound Location: Blister N/A N/A Wounding Event: Lymphedema N/A N/A Primary Etiology: Cataracts, Lymphedema, N/A N/A Comorbid History: Hypertension, Peripheral Venous Disease, Osteoarthritis 01/22/2022 N/A N/A Date Acquired: 16 N/A N/A Weeks of Treatment: Open N/A N/A Wound Status: No N/A N/A Wound Recurrence: 0.6x0.6x0.2 N/A N/A Measurements L x W x D (cm) 0.283 N/A N/A A (cm) : rea 0.057 N/A N/A Volume (cm) : 90.90% N/A N/A % Reduction in A rea: 90.80% N/A N/A % Reduction in Volume: Full Thickness Without Exposed N/A N/A Classification: Support Structures Medium N/A N/A Exudate A mount: Serosanguineous N/A N/A Exudate Type: red, brown N/A N/A Exudate Color: Distinct, outline attached N/A N/A Wound Margin: Large (67-100%) N/A N/A Granulation A mount: Pink N/A N/A Granulation Quality: Small (1-33%) N/A N/A Necrotic A mount: Fat Layer  (Subcutaneous Tissue): Yes N/A N/A Exposed Structures: Fascia: No Tendon: No Muscle: No Joint: No Bone: No Small (1-33%) N/A N/A Epithelialization: Debridement - Selective/Open Wound N/A N/A Debridement: Pre-procedure Verification/Time Out 10:03 N/A N/A Taken: Lidocaine 4% Topical Solution N/A N/A Pain Control: Slough N/A N/A Tissue Debrided: Non-Viable Tissue N/A N/A Level: 0.36 N/A N/A Debridement A (sq cm): rea Curette N/A N/A Instrument: Minimum N/A N/A Bleeding: Pressure N/A N/A Hemostasis A chieved: 0 N/A N/A Procedural Pain: 0 N/A N/A Post Procedural Pain: Procedure was tolerated well N/A N/A Debridement Treatment Response: 0.6x0.6x0.2 N/A N/A Post Debridement Measurements L x W x D (cm) 0.057 N/A N/A Post Debridement Volume: (cm) No Abnormalities Noted N/A N/A Periwound Skin Texture: Maceration: Yes N/A N/A Periwound Skin Moisture: Dry/Scaly: Yes Hemosiderin Staining: Yes N/A N/A Periwound Skin Color: Erythema: No No Abnormality N/A N/A Temperature: Debridement N/A N/A Procedures Performed: Treatment Notes Electronic Signature(s) Signed: 05/15/2022 10:07:43 AM By: [name] MD FACS Entered By: [name] on 05/15/2022 10:07:43 -------------------------------------------------------------------------------- Multi-Disciplinary Care Plan Details Patient Name: Date of Service: [name] 05/15/2022 9:30 A M Medical Record Number: [id number] Patient Account Number: [id number] Date of Birth/Sex: Treating RN: Mar 08, 1955 (67 y.o. [name] Primary Care Kynli Chou: [name] Other Clinician: Referring Kaston Faughn: Treating Fotini Lemus/Extender: [name] in Treatment: [address] TAMARIN, JENNETT D ([id number]) [id number] Page 4 of 7 Abuse / Safety / Falls / Self Care Management Nursing Diagnoses: History of Falls Impaired physical  mobility Goals: Patient/caregiver will identify factors that restrict self-care and home management Date Initiated: 01/22/2022 Target Resolution Date: 09/11/2022 Goal Status: Active Interventions: Assess fall risk on admission and as needed Assess: immobility, friction, shearing, incontinence upon admission and as needed Notes: Wound/Skin Impairment Nursing Diagnoses: Impaired tissue integrity Knowledge deficit related to ulceration/compromised skin integrity Goals: Patient/caregiver will verbalize understanding of skin care regimen Date Initiated: 01/22/2022 Target Resolution Date: 09/11/2022 Goal Status: Active Interventions: Assess ulceration(s) every visit Treatment Activities: Skin care regimen initiated : 01/22/2022 Topical wound management initiated : 01/22/2022 Notes: Electronic Signature(s) Signed: 05/15/2022 2:55:25 PM By: [name] RN Entered By: [name] on 05/15/2022 14:53:31 -------------------------------------------------------------------------------- Pain Assessment Details Patient Name: Date of Service: SHADAI, REDDISH D. 05/15/2022 9:30 A M Medical Record Number: [id number] Patient Account Number: [id number] Date of Birth/Sex: Treating RN: Feb 19, 1955 (67 y.o. F) Primary Care Tonya Wantz: [name] Other Clinician: Referring Ocia Simek: Treating Dimitrious Micciche/Extender: [name] in Treatment: 16 Active Problems Location of Pain Severity and Description of Pain Patient Has Paino No Site Locations Maria Stein, Ramona D ([id number]) [id number]  Page 5 of 7 Pain Management and Medication Current Pain Management: Electronic Signature(s) Signed: 05/15/2022 1:02:24 PM By: Sandre Kitty Entered By: Sandre Kitty on 05/15/2022 09:42:17 -------------------------------------------------------------------------------- Patient/Caregiver Education Details Patient Name: Date of Service: [name]  4/2/2024andnbsp9:30 [place] Record Number: [id number] Patient Account Number: [id number] Date of Birth/Gender: Treating RN: 07-12-1955 (67 y.o. [name] Primary Care Physician: [name] Other Clinician: Referring Physician: Treating Physician/Extender: [name] in Treatment: 16 Education Assessment Education Provided To: Patient Education Topics Provided Wound/Skin Impairment: Methods: Explain/Verbal Responses: Return demonstration correctly Electronic Signature(s) Signed: 05/15/2022 2:55:25 PM By: [name] RN Entered By: [name] on 05/15/2022 14:53:47 -------------------------------------------------------------------------------- Wound Assessment Details Patient Name: Date of Service: [name] 05/15/2022 9:30 A M Medical Record Number: [id number] Patient Account Number: [id number] Date of Birth/Sex: Treating RN: 02-09-1956 (67 y.o. F) Primary Care Amala Petion: [name] Other Clinician: Referring Eleen Litz: Treating Wilberth Damon/Extender: [name] in Treatment: 16 Wound Status Wound Number: 7 Primary Lymphedema Etiology: Wound Location: Left, Anterior Lower Leg Wound Open Wounding Event: Blister Status: Date Acquired: 01/22/2022 Comorbid Cataracts, Lymphedema, Hypertension, Peripheral Venous Weeks Of Treatment: 16 History: Disease, Osteoarthritis Clustered Wound: No Photos Wound Measurements BRYNJA, JASMIN D ([id number]) Length: (cm) 0.6 Width: (cm) 0.6 Depth: (cm) 0.2 Area: (cm) 0.283 Volume: (cm) 0.057 [id number] Page 6 of 7 % Reduction in Area: 90.9% % Reduction in Volume: 90.8% Epithelialization: Small (1-33%) Wound Description Classification: Full Thickness Without Exposed Support Structures Wound Margin: Distinct, outline attached Exudate Amount: Medium Exudate Type: Serosanguineous Exudate Color: red, brown Foul Odor After  Cleansing: No Slough/Fibrino Yes Wound Bed Granulation Amount: Large (67-100%) Exposed Structure Granulation Quality: Pink Fascia Exposed: No Necrotic Amount: Small (1-33%) Fat Layer (Subcutaneous Tissue) Exposed: Yes Necrotic Quality: Adherent Slough Tendon Exposed: No Muscle Exposed: No Joint Exposed: No Bone Exposed: No Periwound Skin Texture Texture Color No Abnormalities Noted: Yes No Abnormalities Noted: No Erythema: No Moisture Hemosiderin Staining: Yes No Abnormalities Noted: No Dry / Scaly: Yes Temperature / Pain Maceration: Yes Temperature: No Abnormality Treatment Notes Wound #7 (Lower Leg) Wound Laterality: Left, Anterior Cleanser Soap and Water Discharge Instruction: May shower and wash wound with dial antibacterial soap and water prior to dressing change. Wound Cleanser Discharge Instruction: Cleanse the wound with wound cleanser prior to applying a clean dressing using gauze sponges, not tissue or cotton balls. Peri-Wound Care Ketoconazole Cream 2% Discharge Instruction: Apply Ketoconazole as directed Triamcinolone 15 (g) Discharge Instruction: Use triamcinolone 15 (g) as directed Zinc Oxide Ointment 30g tube Discharge Instruction: Apply Zinc Oxide to periwound with each dressing change Sween Lotion (Moisturizing lotion) Discharge Instruction: Apply moisturizing lotion as directed Topical Mupirocin Ointment Discharge Instruction: Apply Mupirocin (Bactroban) as instructed Primary Dressing Sorbalgon AG Dressing, 4x4 (in/in) Discharge Instruction: Apply to wound bed as instructed Secondary Dressing Woven Gauze Sponge, Non-Sterile 4x4 in Discharge Instruction: Apply over primary dressing as directed. Zetuvit Plus 4x8 in Discharge Instruction: Apply over primary dressing as directed. Secured With Compression Wrap FourPress (4 layer compression wrap) Discharge Instruction: Apply four layer compression as directed. May also use Urgo K2 compression system  as alternative. Netting #5 Compression [address] JENALYN, DARDEN D ([id number]) [id number] Page 7 of 7 Add-Ons Electronic Signature(s) Signed: 05/15/2022 1:02:24 PM By: Sandre Kitty Entered By: Sandre Kitty on 05/15/2022 09:47:10 -------------------------------------------------------------------------------- Vitals Details Patient Name: Date of Service: [name] 05/15/2022 9:30 A M Medical Record Number: [id number] Patient Account Number: [id number] Date of Birth/Sex: Treating RN: 1956-02-06 (66  y.o. F) Primary Care Jestina Stephani: [name] Other Clinician: Referring Naara Kelty: Treating Clella Mckeel/Extender: [name] in Treatment: 16 Vital Signs Time Taken: 09:41 Temperature (F): 98.1 Height (in): 62 Pulse (bpm): 108 Weight (lbs): 222 Respiratory Rate (breaths/min): 16 Body Mass Index (BMI): 40.6 Blood Pressure (mmHg): 138/78 Reference Range: 80 - 120 mg / dl Electronic Signature(s) Signed: 05/15/2022 1:02:24 PM By: Sandre Kitty Entered By: Sandre Kitty on 05/15/2022 09:42:08

## 2022-05-15 NOTE — Progress Notes (Addendum)
Erin, [name] ([id number]) [name] Page 1 of 12 Visit Report for 05/15/2022 Chief Complaint Document Details Patient Name: Date of Service: [name], [name] 05/15/2022 9:30 A M Medical Record Number: [id number] Patient Account Number: [id number] Date of Birth/Sex: Treating RN: [birth year] (67 y.o. F) Primary Care Provider: [name] Other Clinician: Referring Provider: Treating Provider/Extender: [name] in Treatment: 16 Information Obtained from: Patient Chief Complaint 04/19/2021: The patient is here for ongoing follow-up regarding 2 left lower extremity wounds. 01/22/2022; patient returns to clinic with a wound on the left anterior lower leg secondary to trauma Electronic Signature(s) Signed: 05/15/2022 10:09:39 AM By: [name] MD FACS Entered By: [name] on 05/15/2022 10:09:39 -------------------------------------------------------------------------------- Debridement Details Patient Name: Date of Service: [name] [name]. 05/15/2022 9:30 A M Medical Record Number: [id number] Patient Account Number: [id number] Date of Birth/Sex: Treating RN: 09-25-1955 (67 y.o. [name] Primary Care Provider: [name] Other Clinician: Referring Provider: Treating Provider/Extender: [name] in Treatment: 16 Debridement Performed for Assessment: Wound #7 Left,Anterior Lower Leg Performed By: Physician [name], MD Debridement Type: Debridement Level of Consciousness (Pre-procedure): Awake and Alert Pre-procedure Verification/Time Out Yes - 10:03 Taken: Start Time: 10:03 Pain Control: Lidocaine 4% T opical Solution T Area Debrided (L x W): otal 0.6 (cm) x 0.6 (cm) = 0.36 (cm) Tissue and other material debrided: Non-Viable, Colony Level: Non-Viable Tissue Debridement Description: Selective/Open Wound Instrument: Curette Bleeding: Minimum Hemostasis  Achieved: Pressure End Time: 10:04 Procedural Pain: 0 Post Procedural Pain: 0 Response to Treatment: Procedure was tolerated well Level of Consciousness (Post- Awake and Alert procedure): Post Debridement Measurements of Total Wound Length: (cm) 0.6 Width: (cm) 0.6 Depth: (cm) 0.2 Volume: (cm) 0.057 Character of Wound/Ulcer Post Debridement: Improved Post Procedure Diagnosis Same as Pre-procedure Notes Scribed for Dr. Celine Ahr by J.[name] Electronic Signature(s) Signed: 05/15/2022 10:13:29 AM By: [name] MD FACS Signed: 05/15/2022 2:55:25 PM By: [name] RN [name], [name] ([id number]) By: [name] RN [phone].pdf Page 2 of 12 Signed: 05/15/2022 2:55:25 PM Entered By: [name] on 05/15/2022 10:06:49 -------------------------------------------------------------------------------- HPI Details Patient Name: Date of Service: [name], [name] 05/15/2022 9:30 A M Medical Record Number: [id number] Patient Account Number: [id number] Date of Birth/Sex: Treating RN: Mar 21, 1955 (67 y.o. 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 She tells [name] that she fell on the gravel driveway in July. She developed injuries on her distal lower leg which have not healed. She saw her primary physician on 11/15/2017 who noted her left shin injuries. Gave her antibiotics. At that point the wounds were almost circumferential however most were less than 1.5 cm. Weeping edema fluid was noted. She was referred here for evaluation. The patient has a history of chronic lower extremity edema. 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 She has had prior history with leg weeping fluid. She does not wear compression stockings. She is not doing anything specific to these wound areas. 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 She also has lymphedema. Put her in 3 layer compression and silver alginate last week. She has 3 small wounds with depth just lateral to the tibia. More problematically than this she has numerous shallow areas some of which are almost canal like in shape with tightly adherent painful debris. It would be very difficult and time- consuming to go through this and attempt to individually debride all these areas.. I changed her to collagen today to see if that would help with any of the surface debris on some of these wounds. Otherwise we will not be able to put this in compression we have to have someone change the dressing. The patient is not eligible for home health 12/25/17 on evaluation today patient actually appears to be doing rather well in regard to the ulcer on her lower extremity. Fortunately there does not appear to be evidence of infection at this time. She has been tolerating the dressing changes without complication. This includes the compression wrap. The only issue she had was that the wrap was initially placed over her bunion region which actually calls her some discomfort and pain. Other than that things seem to be going rather well. 01/01/2018 Seen today for follow-up and management of left lower extremity wound and lymphedema. T oday she presents with a new wound towards to the left lateral LE. Recently treated with a 7 day course of amoxicillin; reason for antibiotic dose is  unknown at this time. Tolerating current treatment of collagen with 4- layer wraps. She obtained a venous reflux study on 12/25/17. Studies show on the right abnormal reflux times of the popliteal vein, great saphenous vein at the saphenofemoral junction at the proximal thigh, great saphenous vein at the mid calf, and origin of the small saphenous vein.No superficial thrombosis. No deep vein thrombosis in the common femoral, femoral,and popliteal veins. Left abnormal reflex times as well of the common femoral vein, popliteal vein, and a great saphenous vein at the saphenofemoral junction, In great saphenous vein at the mid thigh w/o thrombosis. Has any issues or concerns during visit today. Recommended follow-up to vascular specialist due to abnormalities from the venous reflux study. Denies fever, pain, chills, dizziness, nausea, or vomiting. 01/08/18 upon evaluation today the patient actually seems to be showing some signs of improvement in my opinion at this point in regard to the lower extremity ulcerated areas. She still has a lot of drainage but fortunately nothing that appears to be too significant currently. I have been very happy with the overall progress I see today compared to where things were during the last evaluation that I had with her. Nonetheless she has not had her appointment with the vein specialist as of yet in fact we were able to get this approved for her today and confirmed with them she will be seeing them on December 26. Nonetheless in general I do feel like the compression wraps is doing well for her. 01/17/18; quite a bit of improvement since last time I saw this patient she has a small open area remaining on the left lateral calf and even smaller area medially. She has lymphedema chronic stasis changes with distal skin fibrosis. She has an appointment with vascular surgery later this month 01/24/2018; the patient's medial leg has closed. Still a small open area on the left  lateral leg. She states that the 4 layer compression we put on last week was too tight and she had to take it  off over a few days ago. We have had resultant increase in her lymphedema in the dorsal foot and a proximal calf. Fortunately that does not seem to have resulted in any deterioration in her wounds The patient is going to need compression stockings. We have given her measurements to phone elastic therapy in [place]. She has vascular surgery consult on December 26 02/07/18; she's had continuous contraction on the left lateral leg wound which is now very small. Currently using silver alginate under 3 layer compression She saw Dr. Trula Slade of vascular surgery on 02/06/18. It was noted that she had a normal reflux times in the popliteal vein, great saphenous vein at the saphenofemoral junction, great saphenous vein at the proximal thigh great saphenous vein at the mid calf and origin of the small saphenous vein. It was noted that she had significant reflux in the left saphenous veins with diameter measurements in the 0.7-0.8 cm range. It was felt she would benefit from laser ablation to help minimize the risk of ulcer recurrence. It was recommended that she wear 20-30 thigh-high compression stockings and have follow-up in 4-6 weeks 02/17/2018; I thought this lady would be healed however her compression slipped down and she developed increasing swelling and the wound is actually larger. 1/13; we had deterioration last week after the patient's compression slipped down and she developed periwound swelling. I increased her compression before layers we have been using silver alginate we are a lot better again today. She has her compression stockings in waiting 1/23; the patient's wounds are totally healed today. She has her stockings. This was almost circumferential skin damage. She follows up with Dr. Trula Slade of vascular surgery next Monday. READMISSION 02/21/2021 This is a now 67 year old woman that  we had in clinic here discharging in January 2020 with wounds on her left calf chronic venous insufficiency. She was discharged with 30/40 stockings. It does not sound like she has worn stockings in about a year largely from not being able to get them on herself. In November she developed new blisters on her legs an area laterally is opened into a fairly sizable wound. She has weeping posteriorly as well. She has been using Neosporin and Band-Aids. The patient did see Dr. Trula Slade in 2019 and 2020. He felt she might benefit from laser ablation in her left saphenous veins although because of the wound that healed I do not think he went through with it. She might benefit from seeing him again. [name], [name] ([id number]) [name] Page 3 of 12 Her ABI on the left is 1. 1/17; patient's wound on the posterior left calf is closed she has the 2 large areas last week. We put her in 4-layer compression for edema control is a lot better. Our intake nurse noted greenish drainage and odor. We have been using silver alginate 1/25; PCR culture I did have the substantial wound area on the left lateral lower leg showed staff aureus and group A strep. Low titers of coag negative staph which are probably skin contaminants. Resistance detected to tetracycline methicillin and macrolides. I gave her a starter kit of Nuzyra 150 mg x 3 for 2 days then 300 mg for a further 7 days. Marked odor considerable increase in surrounding erythema. We are using Iodoflex last week however I have changed to silver alginate with underlying Bactroban 2/1; she is completing her [place] tomorrow. The degree of erythema around the wounds looks a lot better. Odor has improved. I gave her silver alginate and Bactroban last  week and changing her back to Iodoflex to continue with ongoing debridement 2/8; the periwound looks a lot better. Surface of the wound also looks somewhat better although there is still  ongoing debridement to be done we have been using Iodoflex under compression. Primary dressing and silver alginate 2/15; left posterior calf. Some improvement in the surface of the wound but still very gritty we have been using Iodoflex under compression. 04/05/2021: Left lateral posterior calf. She continues to have significant amounts of drainage, some of which is probably comprised of the Iodoflex microbleeds. There is no odor to the drainage. The satellite lesion on the more posterior aspect of the calf is epithelializing nicely and has contracted quite a bit. There is robust granulation tissue in the dominant wound with minimal adherent slough. 04/12/2021: The satellite lesion has nearly closed. She continues to have good granulation tissue with minimal slough of the larger primary wound. She continues to have a fair amount of drainage, but I think this is less secondary to switching her dressing from Iodoflex to Prisma. 04/19/2021: The satellite lesion is almost completely epithelialized. The larger primary wound continues to contract with good granulation tissue and minimal slough. She is currently in [place] with compression. 04/26/2021: The satellite lesion has closed. The larger primary wound has contracted further and the granulation tissue is robust without being hypertrophic. Minimal slough is present. 05/03/2021: The satellite lesion remains closed. The larger primary wound has a bit of slough, but has also contracted further with good perimeter epithelialization. Good granulation tissue at the wound surface. There was some greenish drainage on the dressing when it was removed; it is not the blue-green typically associated with Pseudomonas aeruginosa, however. 05/10/2021: The primary wound continues to contract. There is minimal slough. The granulation tissue at 9:00 is a bit hypertrophic. No significant drainage. No odor. 05/17/2021: The wound is a little bit smaller today with good granulation  tissue. Minimal slough. No significant drainage or odor. 05/24/2021: The wound continues to contract and has a nice base of granulation tissue. Small amount of slough. No concern for infection. 05/31/2021: For some reason, the wound measured slightly larger today but overall it still appears to be in good condition with a nice base of granulation tissue and minimal slough. 06/07/2021: The wound is smaller today. Good granulation tissue and minimal slough. 06/14/2021: The wound is unchanged in size. She continues to accumulate some slough. Good granulation tissue on the surface. 06/20/2021: The wound is smaller today. Minimal slough with good granulation tissue. 06/27/2021: The wound on her left lateral leg is smaller today with just a bit of slough and eschar accumulation. Unfortunately, she has opened a new superficial wound on her right lower extremity. She has 3+ pitting edema to the knees on that leg and she does not wear compression stockings. 07/04/2021: In addition to the superficial wound on her right lower extremity that she opened up last week, she has opened 3 additional sites on the same leg. Her compression wraps were clearly not in correct position when she came to clinic today as they were at about the mid calf level rather than to the tibial tuberosity. She says that they slipped earlier and she had to come back on Friday to have them redone. The wound on her left lateral leg is perhaps slightly larger with some slough accumulation. 07/11/2021: The superficial wounds on her right lower extremity are nearly closed. They just have a thin layer of eschar overlying them. The wound on her left  lateral leg is a little bit shallower and has a bit of slough accumulation. 07/17/2021: The right medial lower extremity leg wounds have almost completely closed; one of them just has a tiny opening with a little bit of serous drainage. The left lateral leg wound is really unchanged. It seems to be  stalled. 07/24/2021: The right leg wounds are completely closed. The left lateral leg wound is actually bigger today. It is a bit more tender. There is a minor accumulation of slough on the surface. 07/31/2021: The culture that I took last week was positive for MRSA. We added mupirocin under the silver alginate and I prescribed doxycycline. She has been tolerating this well. The wound is smaller today but does have slough accumulation. No significant pain or drainage. 08/07/2021: She completed her course of doxycycline. The wound looks much better today. It is smaller and the periwound is less inflamed. She does have some slough accumulation on the surface. 08/14/2021: The wound continues to contract. There is slough on the wound surface, but the periwound is intact without inflammation or induration. 08/21/2021: The wound is down to just 2 small open sites. They both have a bit of slough accumulation. 08/28/2021: The wound is down to just 1 small open site with a little bit of slough and eschar accumulation. 09/04/2021: The wound continues to contract but remains open. It is clean without any slough. 09/12/2021: No real change in the overall wound dimensions, but it is flush with the surrounding skin. There is a little bit of slough accumulation. Edema control is good. 09/22/2021: The wound is smaller and even more superficial. Minimal slough accumulation. Good control of edema. 09/29/2021: The wound continues to contract. She reports that it was a little bit "twingey" during the week. It is a little bit dry on inspection today. Light accumulation of slough. Good edema control. 10/06/2021: The wound is about the same size today. There is a little slough on the surface. Edema control is good. [name], [name] ([id number]) [name] Page 4 of 12 10/13/2021: The wound is about the same size but more superficial. A little bit of slough on the surface. 10/23/2021: The wound is slightly  smaller and continues to fill in. Minimal slough on the wound surface. 10/30/2021: The wound continues to contract but has not yet closed. Light slough and eschar present. 11/06/2021: The wound persists, but seems a little bit more epithelialized. There is still slough and eschar accumulation. 11/14/2021: The wound continues to contract but persists. Light eschar around the wound. 11/22/2021: The wound is down to just a pinhole opening. There is eschar overlying the surface. Edema control is excellent. 11/29/2021: Her wound is closed. READMISSION 01/22/2022 This is a patient to be discharged in October. She has severe chronic venous insufficiency at that time she had a wound on her left lower leg posteriorly also the right leg at some point. These closed. We discharged her in 30/40 mm stockings from elastic therapy which she has been wearing religiously. She tells [name] that she was traumatized by a dolly while shopping at [employer] about 2 to 3 weeks ago. She has been left with a left anterior lower leg wound. She comes in in another pair of stockings other than her 3040s. She does not have an arterial issue with her last ABI in the left at 1.2 12/18; this is a patient who has chronic venous insufficiency and recurrent venous insufficiency ulcers. She has a area on her left anterior lower leg and we readmitted her  to the clinic last week. We use silver alginate and 4-layer compression. ABI was 1.2 She brought in her stocking which was a 20/30 below-knee stocking from elastic therapy. She may require a 30/40 mm equivalent stockings after this wound heals. 02/06/2022: The intake nurse reported an odor coming from the wound when she first unwrapped it but this abated after the leg was washed. There is thick layer of slough on the surface. 02/13/2022: The wound is cleaner today. It measured larger, but on visual inspection, I think some crust of Iodoflex was included in the wound measurements; the actual wound  itself looks about the same to slightly smaller. Edema control is good. 02/20/2022: The wound is cleaner again today. It is also measuring a little bit smaller, but there has been some moisture related periwound breakdown. Edema control is good. 02/27/2022: The intake nurse reported that the wound measures larger, but some of this ended up being crusted on Iodoflex. There is still some periwound moisture. Still with thick slough accumulation. Edema control is good. 03/06/2022: The wound is cleaner and more superficial, but did measure larger because of the inclusion of a satellite area. Still with some slough accumulation but less so than on prior visits. 03/13/2022: The wound measured a little bit smaller today. There is slough and eschar accumulation, as per usual. 03/20/2022: Her wound is larger and deeper today. The entire surface appears nonviable. There is more periwound erythema and threatened tissue breakdown. 03/27/2022: The culture that I took last week was positive for MRSA. We had empirically applied topical mupirocin to her wound. Today the wound is smaller and cleaner and less painful. There is still a layer of slough on the surface. 04/03/2022: The wound was measured slightly larger today, but appears roughly the same to my eye. There is a layer of slough on the surface. Underneath, the tissue is quite fibrotic. 04/10/2022: The wound is slightly smaller today. There is slough and eschar accumulation. For some reason, her wrap got changed from 4 layers to 3 layers and her edema control is not as good, as a result. 04/17/2022: The return to true 4-layer compression has improved the wound considerably. It is much smaller and cleaner today. Edema control is excellent. 04/24/2022: Her wound continues to improve. It is smaller with just a bit of slough on the surface. Edema control is excellent. 05/08/2022: Her wound is smaller again today. There is still slough accumulation on the surface. Edema control  remains excellent. 05/15/2022: The wound is substantially smaller this week. There is slough on the surface with excellent edema control. Electronic Signature(s) Signed: 05/15/2022 10:10:16 AM By: [name] MD FACS Entered By: [name] on 05/15/2022 10:10:16 -------------------------------------------------------------------------------- Physical Exam Details Patient Name: Date of Service: [name] [name]. 05/15/2022 9:30 A M Medical Record Number: [id number] Patient Account Number: [id number] Date of Birth/Sex: Treating RN: 27-Nov-1955 (67 y.o. F) Primary Care Provider: [name] Other Clinician: Referring Provider: Treating Provider/Extender: [name] in Treatment: 16 Constitutional . Slightly tachycardic. . . no acute distress. Respiratory Normal work of breathing on room air. ANGELIQUA, MUSTO ([id number]) [name] Page 5 of 12 Notes 05/15/2022: The wound is substantially smaller this week. There is slough on the surface with excellent edema control. Electronic Signature(s) Signed: 05/15/2022 10:10:45 AM By: [name] MD FACS Entered By: [name] on 05/15/2022 10:10:45 -------------------------------------------------------------------------------- Physician Orders Details Patient Name: Date of Service: [name] [name]. 05/15/2022 9:30 A M Medical Record Number: [id number] Patient Account Number:  [id number] Date of Birth/Sex: Treating RN: 03-17-1955 (67 y.o. 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 - Dr. Celine Ahr Room 3 Return A Anesthetic (In clinic) Topical Lidocaine 5% applied to wound bed Bathing/ Shower/ Hygiene May shower with protection but do not get wound dressing(s) wet. Protect dressing(s) with water repellant cover (for example, large plastic bag) or a cast cover and may then take shower. - Please do not get the Left lower leg Compression wraps wet. Edema Control - Lymphedema / SCD / Other Avoid standing for long periods of time. Patient to wear own compression stockings every day. Exercise regularly Moisturize legs daily. Wound Treatment Wound #7 - Lower Leg Wound Laterality: Left, Anterior Cleanser: Soap and Water 1 x Per Week/30 Days Discharge Instructions: May shower and wash wound with dial antibacterial soap and water prior to dressing change. Cleanser: Wound Cleanser 1 x Per Week/30 Days Discharge Instructions: Cleanse the wound with wound cleanser prior to applying a clean dressing using gauze sponges, not tissue or cotton balls. Peri-Wound Care: Ketoconazole Cream 2% 1 x Per Week/30 Days Discharge Instructions: Apply Ketoconazole as directed Peri-Wound Care: Triamcinolone 15 (g) 1 x Per Week/30 Days Discharge Instructions: Use triamcinolone 15 (g) as directed Peri-Wound Care: Zinc Oxide Ointment 30g tube 1 x Per Week/30 Days Discharge Instructions: Apply Zinc Oxide to periwound with each dressing change Peri-Wound Care: Sween Lotion (Moisturizing lotion) 1 x Per Week/30 Days Discharge Instructions: Apply moisturizing lotion as directed Topical: Mupirocin Ointment 1 x Per Week/30 Days Discharge Instructions: Apply Mupirocin (Bactroban) as instructed Prim Dressing: Sorbalgon AG Dressing, 4x4 (in/in) 1 x Per Week/30 Days ary Discharge Instructions: Apply to wound bed as instructed Secondary Dressing: Woven Gauze Sponge, Non-Sterile 4x4 in 1 x Per Week/30 Days Discharge Instructions: Apply over primary  dressing as directed. Secondary Dressing: Zetuvit Plus 4x8 in 1 x Per Week/30 Days Discharge Instructions: Apply over primary dressing as directed. ANAHLY, VILLENA ([id number]) [name] Page 6 of 12 Compression Wrap: FourPress (4 layer compression wrap) 1 x Per Week/30 Days Discharge Instructions: Apply four layer compression as directed. May also use Urgo K2 compression system as alternative. Compression Wrap: Netting #5 1 x Per Week/30 Days Electronic Signature(s) Signed: 05/15/2022 10:13:29 AM By: [name] MD FACS Entered By: [name] on 05/15/2022 10:10:59 -------------------------------------------------------------------------------- Problem List Details Patient Name: Date of Service: [name] [name]. 05/15/2022 9:30 A M Medical Record Number: [id number] Patient Account Number: [id number] Date of Birth/Sex: Treating RN: [birth year] (67 y.o. 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 05/15/2022 9:30 A M Medical Record Number: [id number] Patient Account Number: [id number] Date of Birth/Sex: Treating RN: 1955/10/01 (66 y.o.  F) Primary Care Provider: [name] Other Clinician: Referring  Provider: Treating Provider/Extender: [name] in Treatment: 16 Subjective Chief Complaint Information obtained from Patient 04/19/2021: The patient is here for ongoing follow-up regarding 2 left lower extremity wounds. 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 She tells [name] that she fell on the gravel driveway in July. She developed injuries on her distal lower leg which have not healed. She saw her primary physician on 11/15/2017 who noted her left shin injuries. Gave her antibiotics. At that point the wounds were almost circumferential however most were less than 1.5 cm. Weeping edema fluid was noted. She was referred here for evaluation. The patient has a history of chronic lower extremity edema. 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 She has had prior history with leg weeping fluid. She does not wear compression stockings. She is not doing LIESELOTTE, HESSELTINE D ([id number]) [name] Page 7 of 12 anything specific to these wound areas. 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 She also has lymphedema. Put her in 3 layer compression and silver alginate last week. She has 3 small wounds with depth just lateral to the tibia. More problematically than this she has numerous shallow areas some of which are almost canal like in shape with tightly adherent painful debris.  It would be very difficult and time- consuming to go through this and attempt to individually debride all these areas.. I changed her to collagen today to see if that would help with any of the surface debris on some of these wounds. Otherwise we will not be able to put this in compression we have to have someone change the dressing. The patient is not eligible for home health 12/25/17 on evaluation today patient actually appears to be doing rather well in regard to the ulcer on her lower extremity. Fortunately there does not appear to be evidence of infection at this time. She has been tolerating the dressing changes without complication. This includes the compression wrap. The only issue she had was that the wrap was initially placed over her bunion region which actually calls her some discomfort and pain. Other than that things seem to be going rather well. 01/01/2018 Seen today for follow-up and management of left lower extremity wound and lymphedema. T oday she presents with a new wound towards to the left lateral LE. Recently treated with a 7 day course of amoxicillin; reason for antibiotic dose is unknown at this time. Tolerating current treatment of collagen with 4- layer wraps. She obtained a venous reflux study on 12/25/17. Studies show on the right abnormal reflux times of the popliteal vein, great saphenous vein at the saphenofemoral junction at the proximal thigh, great saphenous vein at the mid calf, and origin of the small saphenous vein.No superficial thrombosis. No deep vein thrombosis in the common femoral, femoral,and popliteal veins. Left abnormal reflex times as well of the common femoral vein, popliteal vein, and a great saphenous vein at the saphenofemoral  junction, In great saphenous vein at the mid thigh w/o thrombosis. Has any issues or concerns during visit today. Recommended follow-up to vascular specialist due to abnormalities from the venous reflux study. Denies fever,  pain, chills, dizziness, nausea, or vomiting. 01/08/18 upon evaluation today the patient actually seems to be showing some signs of improvement in my opinion at this point in regard to the lower extremity ulcerated areas. She still has a lot of drainage but fortunately nothing that appears to be too significant currently. I have been very happy with the overall progress I see today compared to where things were during the last evaluation that I had with her. Nonetheless she has not had her appointment with the vein specialist as of yet in fact we were able to get this approved for her today and confirmed with them she will be seeing them on December 26. Nonetheless in general I do feel like the compression wraps is doing well for her. 01/17/18; quite a bit of improvement since last time I saw this patient she has a small open area remaining on the left lateral calf and even smaller area medially. She has lymphedema chronic stasis changes with distal skin fibrosis. She has an appointment with vascular surgery later this month 01/24/2018; the patient's medial leg has closed. Still a small open area on the left lateral leg. She states that the 4 layer compression we put on last week was too tight and she had to take it off over a few days ago. We have had resultant increase in her lymphedema in the dorsal foot and a proximal calf. Fortunately that does not seem to have resulted in any deterioration in her wounds The patient is going to need compression stockings. We have given her measurements to phone elastic therapy in [place]. She has vascular surgery consult on December 26 02/07/18; she's had continuous contraction on the left lateral leg wound which is now very small. Currently using silver alginate under 3 layer compression She saw Dr. Trula Slade of vascular surgery on 02/06/18. It was noted that she had a normal reflux times in the popliteal vein, great saphenous vein at the saphenofemoral junction,  great saphenous vein at the proximal thigh great saphenous vein at the mid calf and origin of the small saphenous vein. It was noted that she had significant reflux in the left saphenous veins with diameter measurements in the 0.7-0.8 cm range. It was felt she would benefit from laser ablation to help minimize the risk of ulcer recurrence. It was recommended that she wear 20-30 thigh-high compression stockings and have follow-up in 4-6 weeks 02/17/2018; I thought this lady would be healed however her compression slipped down and she developed increasing swelling and the wound is actually larger. 1/13; we had deterioration last week after the patient's compression slipped down and she developed periwound swelling. I increased her compression before layers we have been using silver alginate we are a lot better again today. She has her compression stockings in waiting 1/23; the patient's wounds are totally healed today. She has her stockings. This was almost circumferential skin damage. She follows up with Dr. Trula Slade of vascular surgery next Monday. READMISSION 02/21/2021 This is a now 67 year old woman that we had in clinic here discharging in January 2020 with wounds on her left calf chronic venous insufficiency. She was discharged with 30/40 stockings. It does not sound like she has worn stockings in about a year largely from not being able to get them on herself. In  November she developed new blisters on her legs an area laterally is opened into a fairly sizable wound. She has weeping posteriorly as well. She has been using Neosporin and Band-Aids. The patient did see Dr. Trula Slade in 2019 and 2020. He felt she might benefit from laser ablation in her left saphenous veins although because of the wound that healed I do not think he went through with it. She might benefit from seeing him again. Her ABI on the left is 1. 1/17; patient's wound on the posterior left calf is closed she has the 2 large areas  last week. We put her in 4-layer compression for edema control is a lot better. Our intake nurse noted greenish drainage and odor. We have been using silver alginate 1/25; PCR culture I did have the substantial wound area on the left lateral lower leg showed staff aureus and group A strep. Low titers of coag negative staph which are probably skin contaminants. Resistance detected to tetracycline methicillin and macrolides. I gave her a starter kit of Nuzyra 150 mg x 3 for 2 days then 300 mg for a further 7 days. Marked odor considerable increase in surrounding erythema. We are using Iodoflex last week however I have changed to silver alginate with underlying Bactroban 2/1; she is completing her [place] tomorrow. The degree of erythema around the wounds looks a lot better. Odor has improved. I gave her silver alginate and Bactroban last week and changing her back to Iodoflex to continue with ongoing debridement 2/8; the periwound looks a lot better. Surface of the wound also looks somewhat better although there is still ongoing debridement to be done we have been using Iodoflex under compression. Primary dressing and silver alginate 2/15; left posterior calf. Some improvement in the surface of the wound but still very gritty we have been using Iodoflex under compression. 04/05/2021: Left lateral posterior calf. She continues to have significant amounts of drainage, some of which is probably comprised of the Iodoflex microbleeds. There is no odor to the drainage. The satellite lesion on the more posterior aspect of the calf is epithelializing nicely and has contracted quite a bit. There is robust granulation tissue in the dominant wound with minimal adherent slough. 04/12/2021: The satellite lesion has nearly closed. She continues to have good granulation tissue with minimal slough of the larger primary wound. She continues to have a fair amount of drainage, but I think this is less secondary to switching  her dressing from Iodoflex to Prisma. 04/19/2021: The satellite lesion is almost completely epithelialized. The larger primary wound continues to contract with good granulation tissue and minimal slough. She is currently in [place] with compression. 04/26/2021: The satellite lesion has closed. The larger primary wound has contracted further and the granulation tissue is robust without being hypertrophic. Minimal slough is present. [name], [name] ([id number]) [name] Page 8 of 12 05/03/2021: The satellite lesion remains closed. The larger primary wound has a bit of slough, but has also contracted further with good perimeter epithelialization. Good granulation tissue at the wound surface. There was some greenish drainage on the dressing when it was removed; it is not the blue-green typically associated with Pseudomonas aeruginosa, however. 05/10/2021: The primary wound continues to contract. There is minimal slough. The granulation tissue at 9:00 is a bit hypertrophic. No significant drainage. No odor. 05/17/2021: The wound is a little bit smaller today with good granulation tissue. Minimal slough. No significant drainage or odor. 05/24/2021: The wound continues to contract and has a nice  base of granulation tissue. Small amount of slough. No concern for infection. 05/31/2021: For some reason, the wound measured slightly larger today but overall it still appears to be in good condition with a nice base of granulation tissue and minimal slough. 06/07/2021: The wound is smaller today. Good granulation tissue and minimal slough. 06/14/2021: The wound is unchanged in size. She continues to accumulate some slough. Good granulation tissue on the surface. 06/20/2021: The wound is smaller today. Minimal slough with good granulation tissue. 06/27/2021: The wound on her left lateral leg is smaller today with just a bit of slough and eschar accumulation. Unfortunately, she has opened a new  superficial wound on her right lower extremity. She has 3+ pitting edema to the knees on that leg and she does not wear compression stockings. 07/04/2021: In addition to the superficial wound on her right lower extremity that she opened up last week, she has opened 3 additional sites on the same leg. Her compression wraps were clearly not in correct position when she came to clinic today as they were at about the mid calf level rather than to the tibial tuberosity. She says that they slipped earlier and she had to come back on Friday to have them redone. The wound on her left lateral leg is perhaps slightly larger with some slough accumulation. 07/11/2021: The superficial wounds on her right lower extremity are nearly closed. They just have a thin layer of eschar overlying them. The wound on her left lateral leg is a little bit shallower and has a bit of slough accumulation. 07/17/2021: The right medial lower extremity leg wounds have almost completely closed; one of them just has a tiny opening with a little bit of serous drainage. The left lateral leg wound is really unchanged. It seems to be stalled. 07/24/2021: The right leg wounds are completely closed. The left lateral leg wound is actually bigger today. It is a bit more tender. There is a minor accumulation of slough on the surface. 07/31/2021: The culture that I took last week was positive for MRSA. We added mupirocin under the silver alginate and I prescribed doxycycline. She has been tolerating this well. The wound is smaller today but does have slough accumulation. No significant pain or drainage. 08/07/2021: She completed her course of doxycycline. The wound looks much better today. It is smaller and the periwound is less inflamed. She does have some slough accumulation on the surface. 08/14/2021: The wound continues to contract. There is slough on the wound surface, but the periwound is intact without inflammation or induration. 08/21/2021: The  wound is down to just 2 small open sites. They both have a bit of slough accumulation. 08/28/2021: The wound is down to just 1 small open site with a little bit of slough and eschar accumulation. 09/04/2021: The wound continues to contract but remains open. It is clean without any slough. 09/12/2021: No real change in the overall wound dimensions, but it is flush with the surrounding skin. There is a little bit of slough accumulation. Edema control is good. 09/22/2021: The wound is smaller and even more superficial. Minimal slough accumulation. Good control of edema. 09/29/2021: The wound continues to contract. She reports that it was a little bit "twingey" during the week. It is a little bit dry on inspection today. Light accumulation of slough. Good edema control. 10/06/2021: The wound is about the same size today. There is a little slough on the surface. Edema control is good. 10/13/2021: The wound is about the  same size but more superficial. A little bit of slough on the surface. 10/23/2021: The wound is slightly smaller and continues to fill in. Minimal slough on the wound surface. 10/30/2021: The wound continues to contract but has not yet closed. Light slough and eschar present. 11/06/2021: The wound persists, but seems a little bit more epithelialized. There is still slough and eschar accumulation. 11/14/2021: The wound continues to contract but persists. Light eschar around the wound. 11/22/2021: The wound is down to just a pinhole opening. There is eschar overlying the surface. Edema control is excellent. 11/29/2021: Her wound is closed. READMISSION 01/22/2022 This is a patient to be discharged in October. She has severe chronic venous insufficiency at that time she had a wound on her left lower leg posteriorly also the right leg at some point. These closed. We discharged her in 30/40 mm stockings from elastic therapy which she has been wearing religiously. She tells [name] that she was traumatized by a  dolly while shopping at [employer] about 2 to 3 weeks ago. She has been left with a left anterior lower leg wound. She comes in in another pair of stockings other than her 3040s. She does not have an arterial issue with her last ABI in the left at 1.2 12/18; this is a patient who has chronic venous insufficiency and recurrent venous insufficiency ulcers. She has a area on her left anterior lower leg and we readmitted her to the clinic last week. We use silver alginate and 4-layer compression. ABI was 1.2 She brought in her stocking which was a 20/30 below-knee stocking from elastic therapy. She may require a 30/40 mm equivalent stockings after this wound heals. 02/06/2022: The intake nurse reported an odor coming from the wound when she first unwrapped it but this abated after the leg was washed. There is thick layer of slough on the surface. [name], [name] ([id number]) [name] Page 9 of 12 02/13/2022: The wound is cleaner today. It measured larger, but on visual inspection, I think some crust of Iodoflex was included in the wound measurements; the actual wound itself looks about the same to slightly smaller. Edema control is good. 02/20/2022: The wound is cleaner again today. It is also measuring a little bit smaller, but there has been some moisture related periwound breakdown. Edema control is good. 02/27/2022: The intake nurse reported that the wound measures larger, but some of this ended up being crusted on Iodoflex. There is still some periwound moisture. Still with thick slough accumulation. Edema control is good. 03/06/2022: The wound is cleaner and more superficial, but did measure larger because of the inclusion of a satellite area. Still with some slough accumulation but less so than on prior visits. 03/13/2022: The wound measured a little bit smaller today. There is slough and eschar accumulation, as per usual. 03/20/2022: Her wound is larger and deeper today. The  entire surface appears nonviable. There is more periwound erythema and threatened tissue breakdown. 03/27/2022: The culture that I took last week was positive for MRSA. We had empirically applied topical mupirocin to her wound. Today the wound is smaller and cleaner and less painful. There is still a layer of slough on the surface. 04/03/2022: The wound was measured slightly larger today, but appears roughly the same to my eye. There is a layer of slough on the surface. Underneath, the tissue is quite fibrotic. 04/10/2022: The wound is slightly smaller today. There is slough and eschar accumulation. For some reason, her wrap got changed from 4 layers  to 3 layers and her edema control is not as good, as a result. 04/17/2022: The return to true 4-layer compression has improved the wound considerably. It is much smaller and cleaner today. Edema control is excellent. 04/24/2022: Her wound continues to improve. It is smaller with just a bit of slough on the surface. Edema control is excellent. 05/08/2022: Her wound is smaller again today. There is still slough accumulation on the surface. Edema control remains excellent. 05/15/2022: The wound is substantially smaller this week. There is slough on the surface with excellent edema control. Patient History Information obtained from Patient. Family History Heart Disease - Mother,Father, Hypertension - Mother,Father, Kidney Disease - Mother, Thyroid Problems - Mother, No family history of Cancer, Diabetes, Hereditary Spherocytosis, Lung Disease, Seizures, Stroke, Tuberculosis. Social History Never smoker, Marital Status - Single, Alcohol Use - Never, Drug Use - No History, Caffeine Use - Daily - coffee. Medical History Eyes Patient has history of Cataracts Hematologic/Lymphatic Patient has history of Lymphedema Cardiovascular Patient has history of Hypertension, Peripheral Venous Disease Integumentary (Skin) Denies history of History of  Burn Musculoskeletal Patient has history of Osteoarthritis Hospitalization/Surgery History - colostomy reversal. 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 Medical A Surgical History Notes nd Constitutional Symptoms (General Health) morbid obesity Cardiovascular schamberg disease, hyperlipidemia Gastrointestinal diverticulitis , h/o obstruction due to diverticulitis , colostomy and colostomy reversal Endocrine hypothyroidism Objective Constitutional Slightly tachycardic. no acute distress. Vitals Time Taken: 9:41 AM, Height: 62 in, Weight: 222 lbs, BMI: 40.6, Temperature: 98.1 F, Pulse: 108 bpm, Respiratory Rate: 16 breaths/min, Blood Pressure: 138/78 mmHg. Respiratory Normal work of breathing on room air. [name], [name] ([id number]) [name] Page 10 of 12 General Notes: 05/15/2022: The wound is substantially smaller this week. There is slough on the surface with excellent edema control. Integumentary (Hair, Skin) Wound #7 status is Open. Original cause of wound was Blister. The date acquired was: 01/22/2022. The wound has been in treatment 16 weeks. The wound is located on the Left,Anterior Lower Leg. The wound measures 0.6cm length x 0.6cm width x 0.2cm depth; 0.283cm^2 area and 0.057cm^3 volume. There is Fat Layer (Subcutaneous Tissue) exposed. There is a medium amount of serosanguineous drainage noted. The wound margin is distinct with the outline attached to the wound base. There is large (67-100%) pink granulation within the wound bed. There is a small (1-33%) amount of necrotic tissue within the wound bed including Adherent Slough. The periwound skin appearance had no abnormalities noted for texture. The periwound skin appearance exhibited: Dry/Scaly, Maceration, Hemosiderin Staining. The periwound skin appearance did not exhibit: Erythema. Periwound temperature was noted as No Abnormality. Assessment Active Problems ICD-10 Non-pressure chronic ulcer  of unspecified part of left lower leg with other specified severity Chronic venous hypertension (idiopathic) with ulcer of left lower extremity Procedures Wound #7 Pre-procedure diagnosis of Wound #7 is a Lymphedema located on the Left,Anterior Lower Leg . There was a Selective/Open Wound Non-Viable Tissue Debridement with a total area of 0.36 sq cm performed by [name], MD. With the following instrument(s): Curette to remove Non-Viable tissue/material. Material removed includes [facility] after achieving pain control using Lidocaine 4% Topical Solution. No specimens were taken. A time out was conducted at 10:03, prior to the start of the procedure. A Minimum amount of bleeding was controlled with Pressure. The procedure was tolerated well with a pain level of 0 throughout and a pain level of 0 following the procedure. Post Debridement Measurements: 0.6cm length x 0.6cm width x 0.2cm depth; 0.057cm^3 volume. Character of Wound/Ulcer  Post Debridement is improved. Post procedure Diagnosis Wound #7: Same as Pre-Procedure General Notes: Scribed for Dr. Lady Gary by J.[name]. Pre-procedure diagnosis of Wound #7 is a Lymphedema located on the Left,Anterior Lower Leg . There was a Four Layer Compression Therapy Procedure by Karie Schwalbe, RN. Post procedure Diagnosis Wound #7: Same as Pre-Procedure Notes: Or URGO K2 compression. Plan Follow-up Appointments: Return Appointment in 1 week. - Dr. Lady Gary Room 3 Anesthetic: (In clinic) Topical Lidocaine 5% applied to wound bed Bathing/ Shower/ Hygiene: May shower with protection but do not get wound dressing(s) wet. Protect dressing(s) with water repellant cover (for example, large plastic bag) or a cast cover and may then take shower. - Please do not get the Left lower leg Compression wraps wet. Edema Control - Lymphedema / SCD / Other: Avoid standing for long periods of time. Patient to wear own compression stockings every day. Exercise  regularly Moisturize legs daily. WOUND #7: - Lower Leg Wound Laterality: Left, Anterior Cleanser: Soap and Water 1 x Per Week/30 Days Discharge Instructions: May shower and wash wound with dial antibacterial soap and water prior to dressing change. Cleanser: Wound Cleanser 1 x Per Week/30 Days Discharge Instructions: Cleanse the wound with wound cleanser prior to applying a clean dressing using gauze sponges, not tissue or cotton balls. Peri-Wound Care: Ketoconazole Cream 2% 1 x Per Week/30 Days Discharge Instructions: Apply Ketoconazole as directed Peri-Wound Care: Triamcinolone 15 (g) 1 x Per Week/30 Days Discharge Instructions: Use triamcinolone 15 (g) as directed Peri-Wound Care: Zinc Oxide Ointment 30g tube 1 x Per Week/30 Days Discharge Instructions: Apply Zinc Oxide to periwound with each dressing change Peri-Wound Care: Sween Lotion (Moisturizing lotion) 1 x Per Week/30 Days Discharge Instructions: Apply moisturizing lotion as directed Topical: Mupirocin Ointment 1 x Per Week/30 Days Discharge Instructions: Apply Mupirocin (Bactroban) as instructed Prim Dressing: Sorbalgon AG Dressing, 4x4 (in/in) 1 x Per Week/30 Days ary Discharge Instructions: Apply to wound bed as instructed Secondary Dressing: Woven Gauze Sponge, Non-Sterile 4x4 in 1 x Per Week/30 Days Discharge Instructions: Apply over primary dressing as directed. Secondary Dressing: Zetuvit Plus 4x8 in 1 x Per Week/30 Days Discharge Instructions: Apply over primary dressing as directed. Com pression Wrap: FourPress (4 layer compression wrap) 1 x Per Week/30 Days Discharge Instructions: Apply four layer compression as directed. May also use Urgo K2 compression system as alternative. Com pression Wrap: Netting #5 1 x Per Week/30 Days [name], PASTORA ([id number]) [phone].pdf Page 11 of 12 05/15/2022: The wound is substantially smaller this week. There is slough on the surface with excellent edema  control. I used a curette to debride slough from the wound surface. We will continue topical mupirocin with silver alginate. Periwound zinc oxide mixed with ketoconazole and 4-layer compression. Follow-up in 1 week. Electronic Signature(s) Signed: 05/18/2022 7:57:18 AM By: [name] MD FACS Signed: 05/18/2022 1:00:20 PM By: Shawn Stall RN, BSN Previous Signature: 05/15/2022 10:12:20 AM Version By: [name] MD FACS Entered By: Shawn Stall on 05/17/2022 16:28:05 -------------------------------------------------------------------------------- HxROS Details Patient Name: Date of Service: [name] 05/15/2022 9:30 A M Medical Record Number: [id number] Patient Account Number: [id number] Date of Birth/Sex: Treating RN: Oct 12, 1955 (67 y.o. F) Primary Care Provider: [name] Other Clinician: Referring Provider: Treating Provider/Extender: Jasmine Pang in Treatment: 16 Information Obtained From Patient Constitutional Symptoms (General Health) Medical History: Past Medical History Notes: morbid obesity Eyes Medical History: Positive for: Cataracts Hematologic/Lymphatic Medical History: Positive for: Lymphedema Cardiovascular Medical History: Positive for: Hypertension; Peripheral Venous  Disease Past Medical History Notes: schamberg disease, hyperlipidemia Gastrointestinal Medical History: Past Medical History Notes: diverticulitis , h/o obstruction due to diverticulitis , colostomy and colostomy reversal Endocrine Medical History: Past Medical History Notes: hypothyroidism Integumentary (Skin) Medical History: Negative for: History of Burn Musculoskeletal Medical History: Positive for: Osteoarthritis HBO Extended History Items EyesJOSHALYN, ANCHETA ([id number]) [name] Page 12 of 12 Cataracts Immunizations Pneumococcal Vaccine: Received Pneumococcal Vaccination: Yes Received Pneumococcal  Vaccination On or After 60th Birthday: Yes Implantable Devices None Hospitalization / Surgery History Type of Hospitalization/Surgery colostomy reversal 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 Family and Social History Cancer: No; Diabetes: No; Heart Disease: Yes - Mother,Father; Hereditary Spherocytosis: No; Hypertension: Yes - Mother,Father; Kidney Disease: Yes - Mother; Lung Disease: No; Seizures: No; Stroke: No; Thyroid Problems: Yes - Mother; Tuberculosis: No; Never smoker; Marital Status - Single; Alcohol Use: Never; Drug Use: No History; Caffeine Use: Daily - coffee; Financial Concerns: No; Food, Clothing or Shelter Needs: No; Support System Lacking: No; Transportation Concerns: No Electronic Signature(s) Signed: 05/15/2022 10:13:29 AM By: [name] MD FACS Entered By: [name] on 05/15/2022 10:10:22 -------------------------------------------------------------------------------- SuperBill Details Patient Name: Date of Service: Earnest Rosier [name]. 05/15/2022 Medical Record Number: [id number] Patient Account Number: [id number] Date of Birth/Sex: Treating RN: 1955/08/13 (67 y.o. 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312 Chronic venous hypertension  (idiopathic) with ulcer of left lower extremity Quantity: 1 : [id number] 97597 - WC PHYS DEBR WO ANESTH 20 SQ CM ICD-10 Diagnosis Description L97.928 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

## 2022-05-17 ENCOUNTER — Other Ambulatory Visit: Payer: Self-pay | Admitting: Nurse Practitioner

## 2022-05-21 ENCOUNTER — Encounter: Payer: Self-pay | Admitting: Internal Medicine

## 2022-05-21 ENCOUNTER — Ambulatory Visit: Payer: Commercial Managed Care - PPO | Admitting: Internal Medicine

## 2022-05-21 VITALS — BP 118/74 | HR 75 | Temp 97.5°F | Ht 62.0 in | Wt 224.0 lb

## 2022-05-21 DIAGNOSIS — Z6841 Body Mass Index (BMI) 40.0 and over, adult: Secondary | ICD-10-CM

## 2022-05-21 DIAGNOSIS — I739 Peripheral vascular disease, unspecified: Secondary | ICD-10-CM | POA: Diagnosis not present

## 2022-05-21 DIAGNOSIS — E039 Hypothyroidism, unspecified: Secondary | ICD-10-CM

## 2022-05-21 DIAGNOSIS — I358 Other nonrheumatic aortic valve disorders: Secondary | ICD-10-CM | POA: Diagnosis not present

## 2022-05-21 DIAGNOSIS — R7309 Other abnormal glucose: Secondary | ICD-10-CM | POA: Diagnosis not present

## 2022-05-21 DIAGNOSIS — I10 Essential (primary) hypertension: Secondary | ICD-10-CM

## 2022-05-21 NOTE — Progress Notes (Signed)
Hershal CoriaI,Ciera J Martin,acting as a Neurosurgeonscribe for Gwynneth Alimentobyn N Norissa Bartee, MD.,have documented all relevant documentation on the behalf of Gwynneth Alimentobyn N Adonica Fukushima, MD,as directed by  Gwynneth Alimentobyn N Delayni Streed, MD while in the presence of Gwynneth Alimentobyn N Suvan Stcyr, MD.    Subjective:     Patient ID: [name] , female    DOB: 16-May-1955 , 67 y.o.   MRN: [id number]   Chief Complaint  Patient presents with   Hypertension    HPI  She presents today for BP & thyroid check. She reports compliance with meds. She denies headaches, chest pain and shortness of breath. She states she is still enjoying her job.   Hypertension This is a chronic problem. The current episode started more than 1 year ago. The problem has been gradually improving since onset. The problem is controlled. Pertinent negatives include no blurred vision, chest pain, palpitations or shortness of breath. Risk factors for coronary artery disease include obesity, sedentary lifestyle and post-menopausal state. Compliance problems include exercise.      Past Medical History:  Diagnosis Date   Arthritis    DOE (dyspnea on exertion)    mild   Hyperlipidemia    Hypertension    Hypothyroidism    Morbid obesity    Pre-diabetes    Schamberg disease      Family History  Problem Relation Age of Onset   Hypertension Mother    Heart Problems Mother        Pacemaker   Heart attack Father        at age 581.   Heart attack Maternal Grandfather [id number]       Died of AAA   AAA (abdominal aortic aneurysm) Maternal Grandfather    Heart attack Maternal Uncle    Stroke Paternal Aunt    Alzheimer's disease Maternal Grandmother      Current Outpatient Medications:    amLODipine (NORVASC) 5 MG tablet, Take 1 tablet (5 mg total) by mouth daily., Disp: 90 tablet, Rfl: 2   Calcium Carbonate-Vitamin D 600-400 MG-UNIT tablet, Take 1 tablet by mouth daily., Disp: , Rfl:    Cholecalciferol (VITAMIN D) 2000 units tablet, Take 4,000 Units by mouth daily., Disp: , Rfl:    lisinopril  (ZESTRIL) 20 MG tablet, Take 1 tablet (20 mg total) by mouth daily., Disp: 90 tablet, Rfl: 2   Magnesium 400 MG TABS, Take by mouth., Disp: , Rfl:    meloxicam (MOBIC) 7.5 MG tablet, Take 1 tablet (7.5 mg total) by mouth daily., Disp: 30 tablet, Rfl: 0   Multiple Vitamins-Minerals (CENTRUM SILVER 50+WOMEN PO), Take by mouth. 1 per day, Disp: , Rfl:    potassium chloride SA (KLOR-CON) 20 MEQ tablet, Take 1 tablet (20 mEq total) by mouth daily as needed (when taking Torsemide)., Disp: 90 tablet, Rfl: 1   Semaglutide-Weight Management (WEGOVY) 1 MG/0.5ML SOAJ, Inject 1 mg into the skin once a week., Disp: 2 mL, Rfl: 1   simvastatin (ZOCOR) 20 MG tablet, Take 1 tablet (20 mg total) by mouth daily., Disp: 90 tablet, Rfl: 1   torsemide (DEMADEX) 20 MG tablet, Take 1 tablet (20 mg total) by mouth daily as needed (for edema)., Disp: 90 tablet, Rfl: 1   levothyroxine (SYNTHROID) 112 MCG tablet, Take 1 tablet (112 mcg total) by mouth daily., Disp: 90 tablet, Rfl: 1   No Known Allergies   Review of Systems  Constitutional: Negative.   Eyes:  Negative for blurred vision.  Respiratory: Negative.  Negative for shortness of breath.  Cardiovascular: Negative.  Negative for chest pain and palpitations.  Gastrointestinal: Negative.   Musculoskeletal: Negative.   Skin: Negative.   Neurological: Negative.   Psychiatric/Behavioral: Negative.       Today's Vitals   05/21/22 1423  BP: 118/74  Pulse: 75  Temp: (!) 97.5 F (36.4 C)  SpO2: 98%  Weight: 224 lb (101.6 kg)  Height: 5\' 2"  (1.575 m)   Body mass index is 40.97 kg/m.  Wt Readings from Last 3 Encounters:  05/21/22 224 lb (101.6 kg)  03/07/22 226 lb (102.5 kg)  01/18/22 224 lb (101.6 kg)    Objective:  Physical Exam Vitals and nursing note reviewed.  Constitutional:      Appearance: Normal appearance.  HENT:     Head: Normocephalic and atraumatic.  Eyes:     Extraocular Movements: Extraocular movements intact.  Cardiovascular:      Rate and Rhythm: Normal rate and regular rhythm.     Heart sounds: Murmur heard.  Pulmonary:     Effort: Pulmonary effort is normal.     Breath sounds: Normal breath sounds.  Musculoskeletal:     Comments: Ambulatory w/ cane  Skin:    General: Skin is warm.  Neurological:     General: No focal deficit present.     Mental Status: She is alert.  Psychiatric:        Mood and Affect: Mood normal.        Behavior: Behavior normal.     Assessment And Plan:     1. Primary hypertension Comments: Chronic, well controlled. She will c/w lisinopril 20mg , amlodipine 5mg  and torsemide daily. Advised to follow low sodium diet. - CMP14+EGFR  2. Primary hypothyroidism Comments: Chronic, she is currently on levothyroxine 112mcg daily. I will check TSH today and adjust meds as needed. - TSH  3. Other abnormal glucose Comments: Previous labs reviewed, her a1c has been elevated in the past. I will recheck an a1c today. She is encouraged to limit her intake of sugary foods/drinks. - Hemoglobin A1c  4. Aortic valve sclerosis Comments: Seen on Jan 2023 echo. I will refer her to Cardiology for further evaluation. Pt advised she will likely have repeat echocardiogram. - Ambulatory referral to Cardiology  5. Peripheral vascular disease Comments: Chronic, encouraged to increase daily activity and comply w/ statin therapy. She is still followed by Wound clinic.  6. Class 3 severe obesity due to excess calories with serious comorbidity and body mass index (BMI) of 40.0 to 44.9 in adult She is encouraged to strive for BMI less than 30 to decrease cardiac risk. Advised to aim for at least 150 minutes of exercise per week.    Patient was given opportunity to ask questions. Patient verbalized understanding of the plan and was able to repeat key elements of the plan. All questions were answered to their satisfaction.   I, Gwynneth Alimentobyn N Avyan Livesay, MD, have reviewed all documentation for this visit. The documentation  on 05/21/22 for the exam, diagnosis, procedures, and orders are all accurate and complete.   IF YOU HAVE BEEN REFERRED TO A SPECIALIST, IT MAY TAKE 1-2 WEEKS TO SCHEDULE/PROCESS THE REFERRAL. IF YOU HAVE NOT HEARD FROM US/SPECIALIST IN TWO WEEKS, PLEASE GIVE US A CALL AT [phone].   THE PATIENT IS ENCOURAGED TO PRACTICE SOCIAL DISTANCING DUE TO THE COVID-19 PANDEMIC.

## 2022-05-21 NOTE — Patient Instructions (Signed)
Hypertension, Adult ?Hypertension is another name for high blood pressure. High blood pressure forces your heart to work harder to pump blood. This can cause problems over time. ?There are two numbers in a blood pressure reading. There is a top number (systolic) over a bottom number (diastolic). It is best to have a blood pressure that is below 120/80. ?What are the causes? ?The cause of this condition is not known. Some other conditions can lead to high blood pressure. ?What increases the risk? ?Some lifestyle factors can make you more likely to develop high blood pressure: ?Smoking. ?Not getting enough exercise or physical activity. ?Being overweight. ?Having too much fat, sugar, calories, or salt (sodium) in your diet. ?Drinking too much alcohol. ?Other risk factors include: ?Having any of these conditions: ?Heart disease. ?Diabetes. ?High cholesterol. ?Kidney disease. ?Obstructive sleep apnea. ?Having a family history of high blood pressure and high cholesterol. ?Age. The risk increases with age. ?Stress. ?What are the signs or symptoms? ?High blood pressure may not cause symptoms. Very high blood pressure (hypertensive crisis) may cause: ?Headache. ?Fast or uneven heartbeats (palpitations). ?Shortness of breath. ?Nosebleed. ?Vomiting or feeling like you may vomit (nauseous). ?Changes in how you see. ?Very bad chest pain. ?Feeling dizzy. ?Seizures. ?How is this treated? ?This condition is treated by making healthy lifestyle changes, such as: ?Eating healthy foods. ?Exercising more. ?Drinking less alcohol. ?Your doctor may prescribe medicine if lifestyle changes do not help enough and if: ?Your top number is above 130. ?Your bottom number is above 80. ?Your personal target blood pressure may vary. ?Follow these instructions at home: ?Eating and drinking ? ?If told, follow the DASH eating plan. To follow this plan: ?Fill one half of your plate at each meal with fruits and vegetables. ?Fill one fourth of your plate  at each meal with whole grains. Whole grains include whole-wheat pasta, brown rice, and whole-grain bread. ?Eat or drink low-fat dairy products, such as skim milk or low-fat yogurt. ?Fill one fourth of your plate at each meal with low-fat (lean) proteins. Low-fat proteins include fish, chicken without skin, eggs, beans, and tofu. ?Avoid fatty meat, cured and processed meat, or chicken with skin. ?Avoid pre-made or processed food. ?Limit the amount of salt in your diet to less than 1,500 mg each day. ?Do not drink alcohol if: ?Your doctor tells you not to drink. ?You are pregnant, may be pregnant, or are planning to become pregnant. ?If you drink alcohol: ?Limit how much you have to: ?0-1 drink a day for women. ?0-2 drinks a day for men. ?Know how much alcohol is in your drink. In the [place] one drink equals one 12 oz bottle of beer (355 mL), one 5 oz glass of wine (148 mL), or one 1? oz glass of hard liquor (44 mL). ?Lifestyle ? ?Work with your doctor to stay at a healthy weight or to lose weight. Ask your doctor what the best weight is for you. ?Get at least 30 minutes of exercise that causes your heart to beat faster (aerobic exercise) most days of the week. This may include walking, swimming, or biking. ?Get at least 30 minutes of exercise that strengthens your muscles (resistance exercise) at least 3 days a week. This may include lifting weights or doing Pilates. ?Do not smoke or use any products that contain nicotine or tobacco. If you need help quitting, ask your doctor. ?Check your blood pressure at home as told by your doctor. ?Keep all follow-up visits. ?Medicines ?Take over-the-counter and prescription medicines   only as told by your doctor. Follow directions carefully. ?Do not skip doses of blood pressure medicine. The medicine does not work as well if you skip doses. Skipping doses also puts you at risk for problems. ?Ask your doctor about side effects or reactions to medicines that you should watch  for. ?Contact a doctor if: ?You think you are having a reaction to the medicine you are taking. ?You have headaches that keep coming back. ?You feel dizzy. ?You have swelling in your ankles. ?You have trouble with your vision. ?Get help right away if: ?You get a very bad headache. ?You start to feel mixed up (confused). ?You feel weak or numb. ?You feel faint. ?You have very bad pain in your: ?Chest. ?Belly (abdomen). ?You vomit more than once. ?You have trouble breathing. ?These symptoms may be an emergency. Get help right away. Call 911. ?Do not wait to see if the symptoms will go away. ?Do not drive yourself to the hospital. ?Summary ?Hypertension is another name for high blood pressure. ?High blood pressure forces your heart to work harder to pump blood. ?For most people, a normal blood pressure is less than 120/80. ?Making healthy choices can help lower blood pressure. If your blood pressure does not get lower with healthy choices, you may need to take medicine. ?This information is not intended to replace advice given to you by your health care provider. Make sure you discuss any questions you have with your health care provider. ?Document Revised: 11/17/2020 Document Reviewed: 11/17/2020 ?Elsevier Patient Education ? 2023 [employer] ? ?

## 2022-05-22 ENCOUNTER — Encounter (HOSPITAL_BASED_OUTPATIENT_CLINIC_OR_DEPARTMENT_OTHER): Payer: Commercial Managed Care - PPO | Admitting: General Surgery

## 2022-05-22 DIAGNOSIS — L97928 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: Secondary | ICD-10-CM | POA: Diagnosis not present

## 2022-05-22 DIAGNOSIS — I87312 Chronic venous hypertension (idiopathic) with ulcer of left lower extremity: Secondary | ICD-10-CM | POA: Diagnosis not present

## 2022-05-22 DIAGNOSIS — I890 Lymphedema, not elsewhere classified: Secondary | ICD-10-CM | POA: Diagnosis not present

## 2022-05-22 LAB — CMP14+EGFR
ALT: 17 IU/L (ref 0–32)
AST: 21 IU/L (ref 0–40)
Albumin/Globulin Ratio: 1.6 (ref 1.2–2.2)
Albumin: 4.4 g/dL (ref 3.9–4.9)
Alkaline Phosphatase: 92 IU/L (ref 44–121)
BUN/Creatinine Ratio: 16 (ref 12–28)
BUN: 17 mg/dL (ref 8–27)
Bilirubin Total: 0.5 mg/dL (ref 0.0–1.2)
CO2: 23 mmol/L (ref 20–29)
Calcium: 9.7 mg/dL (ref 8.7–10.3)
Chloride: 105 mmol/L (ref 96–106)
Creatinine, Ser: 1.05 mg/dL — ABNORMAL HIGH (ref 0.57–1.00)
Globulin, Total: 2.8 g/dL (ref 1.5–4.5)
Glucose: 89 mg/dL (ref 70–99)
Potassium: 4.6 mmol/L (ref 3.5–5.2)
Sodium: 142 mmol/L (ref 134–144)
Total Protein: 7.2 g/dL (ref 6.0–8.5)
eGFR: 58 mL/min/{1.73_m2} — ABNORMAL LOW (ref >59)

## 2022-05-22 LAB — TSH: TSH: 0.978 u[IU]/mL (ref 0.450–4.500)

## 2022-05-22 LAB — HEMOGLOBIN A1C
Est. average glucose Bld gHb Est-mCnc: 123 mg/dL
Hgb A1c MFr Bld: 5.9 % — ABNORMAL HIGH (ref 4.8–5.6)

## 2022-05-23 NOTE — Progress Notes (Signed)
MIKYLAH, ACKROYD ([id number]) [id number] Page 1 of 13 Visit Report for 05/22/2022 Chief Complaint Document Details Patient Name: Date of Service: [name], [name] 05/22/2022 9:30 A M Medical Record Number: [id number] Patient Account Number: [id number] Date of Birth/Sex: Treating RN: 1955-06-29 (67 y.o. F) Primary Care Provider: [name] Other Clinician: Referring Provider: Treating Provider/Extender: Jasmine Pang in Treatment: 17 Information Obtained from: Patient Chief Complaint 04/19/2021: The patient is here for ongoing follow-up regarding 2 left lower extremity wounds. 01/22/2022; patient returns to clinic with a wound on the left anterior lower leg secondary to trauma Electronic Signature(s) Signed: 05/22/2022 10:42:02 AM By: [name] MD FACS Entered By: [name] on 05/22/2022 10:42:02 -------------------------------------------------------------------------------- Debridement Details Patient Name: Date of Service: Erin Rosier NITA [name] 05/22/2022 9:30 A M Medical Record Number: [id number] Patient Account Number: [id number] Date of Birth/Sex: Treating RN: Jun 16, 1955 (67 y.o. [name] Primary Care Provider: [name] Other Clinician: Referring Provider: Treating Provider/Extender: Jasmine Pang in Treatment: 17 Debridement Performed for Assessment: Wound #7 Left,Anterior Lower Leg Performed By: Physician [name], MD Debridement Type: Debridement Level of Consciousness (Pre-procedure): Awake and Alert Pre-procedure Verification/Time Out Yes - 10:25 Taken: Start Time: 10:25 Pain Control: Lidocaine 5% topical ointment T Area Debrided (L x W): otal 0.5 (cm) x 0.5 (cm) = 0.25 (cm) Tissue and other material debrided: Non-Viable, Slough, Slough Level: Non-Viable Tissue Debridement Description: Selective/Open Wound Instrument: Curette Bleeding: Minimum Hemostasis  Achieved: Pressure End Time: 10:26 Procedural Pain: 0 Post Procedural Pain: 0 Response to Treatment: Procedure was tolerated well Level of Consciousness (Post- Awake and Alert procedure): Post Debridement Measurements of Total Wound Length: (cm) 0.5 Width: (cm) 0.5 Depth: (cm) 0.2 Volume: (cm) 0.039 Character of Wound/Ulcer Post Debridement: Improved Post Procedure Diagnosis Same as Pre-procedure Notes Scribed for Dr. Lady Gary by J.[name] Electronic Signature(s) Signed: 05/22/2022 12:21:18 PM By: [name] MD FACS Signed: 05/22/2022 5:01:04 PM By: Karie Schwalbe RN [name], [name] ([id number]) By: Karie Schwalbe RN [phone] Page 2 of 13 Signed: 05/22/2022 5:01:04 PM Entered By: Karie Schwalbe on 05/22/2022 10:30:25 -------------------------------------------------------------------------------- HPI Details Patient Name: Date of Service: [name], [name] 05/22/2022 9:30 A M Medical Record Number: [id number] Patient Account Number: [id number] Date of Birth/Sex: Treating RN: 1955-02-19 (67 y.o. 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 She tells [name] that she fell on the gravel driveway in July. She developed injuries on her distal lower leg which have not healed. She saw her primary physician on 11/15/2017 who noted her left shin injuries. Gave her antibiotics. At that point the wounds were almost circumferential however most were less than 1.5 cm. Weeping edema fluid was noted. She was referred here for evaluation. The patient has a history of chronic lower extremity edema. 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 She has had prior history with leg weeping fluid. She does not wear compression stockings. She is not doing anything specific to these wound areas. 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 She also has lymphedema. Put her in 3 layer compression and silver alginate last week. She has 3 small wounds with depth just lateral to the tibia. More problematically than this she has numerous shallow areas some of which are almost canal like in shape with tightly adherent painful debris. It would be very difficult and time- consuming to go through this and attempt to individually debride all these areas.. I changed her to collagen today to see if that would help with any of the surface debris on some of these wounds. Otherwise we will not be able to put this in compression we have to have someone change the dressing. The patient is not eligible for home health 12/25/17 on evaluation today patient actually appears to be doing rather well in regard to the ulcer on her lower extremity. Fortunately there does not appear to be evidence of infection at this time. She has been tolerating the dressing changes without complication. This includes the compression wrap. The only issue she had was that the wrap was initially placed over her bunion region which actually calls her some discomfort and pain. Other than that things seem to be going rather well. 01/01/2018 Seen today for follow-up and management of left lower extremity wound and lymphedema. T oday she presents with a new wound towards to the left lateral LE. Recently treated with a 7 day course of amoxicillin; reason for antibiotic dose is  unknown at this time. Tolerating current treatment of collagen with 4- layer wraps. She obtained a venous reflux study on 12/25/17. Studies show on the right abnormal reflux times of the popliteal vein, great saphenous vein at the saphenofemoral junction at the proximal thigh, great saphenous vein at the mid calf, and origin of the small saphenous vein.No superficial thrombosis. No deep vein thrombosis in the common femoral, femoral,and popliteal veins. Left abnormal reflex times as well of the common femoral vein, popliteal vein, and a great saphenous vein at the saphenofemoral junction, In great saphenous vein at the mid thigh w/o thrombosis. Has any issues or concerns during visit today. Recommended follow-up to vascular specialist due to abnormalities from the venous reflux study. Denies fever, pain, chills, dizziness, nausea, or vomiting. 01/08/18 upon evaluation today the patient actually seems to be showing some signs of improvement in my opinion at this point in regard to the lower extremity ulcerated areas. She still has a lot of drainage but fortunately nothing that appears to be too significant currently. I have been very happy with the overall progress I see today compared to where things were during the last evaluation that I had with her. Nonetheless she has not had her appointment with the vein specialist as of yet in fact we were able to get this approved for her today and confirmed with them she will be seeing them on December 26. Nonetheless in general I do feel like the compression wraps is doing well for her. 01/17/18; quite a bit of improvement since last time I saw this patient she has a small open area remaining on the left lateral calf and even smaller area medially. She has lymphedema chronic stasis changes with distal skin fibrosis. She has an appointment with vascular surgery later this month 01/24/2018; the patient's medial leg has closed. Still a small open area on the left  lateral leg. She states that the 4 layer compression we put on last week was too tight and she had to take it off  over a few days ago. We have had resultant increase in her lymphedema in the dorsal foot and a proximal calf. Fortunately that does not seem to have resulted in any deterioration in her wounds The patient is going to need compression stockings. We have given her measurements to phone elastic therapy in [place]. She has vascular surgery consult on December 26 02/07/18; she's had continuous contraction on the left lateral leg wound which is now very small. Currently using silver alginate under 3 layer compression She saw Dr. Myra Gianotti of vascular surgery on 02/06/18. It was noted that she had a normal reflux times in the popliteal vein, great saphenous vein at the saphenofemoral junction, great saphenous vein at the proximal thigh great saphenous vein at the mid calf and origin of the small saphenous vein. It was noted that she had significant reflux in the left saphenous veins with diameter measurements in the 0.7-0.8 cm range. It was felt she would benefit from laser ablation to help minimize the risk of ulcer recurrence. It was recommended that she wear 20-30 thigh-high compression stockings and have follow-up in 4-6 weeks 02/17/2018; I thought this lady would be healed however her compression slipped down and she developed increasing swelling and the wound is actually larger. 1/13; we had deterioration last week after the patient's compression slipped down and she developed periwound swelling. I increased her compression before layers we have been using silver alginate we are a lot better again today. She has her compression stockings in waiting 1/23; the patient's wounds are totally healed today. She has her stockings. This was almost circumferential skin damage. She follows up with Dr. Myra Gianotti of vascular surgery next Monday. READMISSION 02/21/2021 This is a now 67 year old woman that  we had in clinic here discharging in January 2020 with wounds on her left calf chronic venous insufficiency. She was discharged with 30/40 stockings. It does not sound like she has worn stockings in about a year largely from not being able to get them on herself. In November she developed new blisters on her legs an area laterally is opened into a fairly sizable wound. She has weeping posteriorly as well. She has been using Neosporin and Band-Aids. The patient did see Dr. Myra Gianotti in 2019 and 2020. He felt she might benefit from laser ablation in her left saphenous veins although because of the wound that healed I do not think he went through with it. She might benefit from seeing him again. [name], [name] ([id number]) [id number] Page 3 of 13 Her ABI on the left is 1. 1/17; patient's wound on the posterior left calf is closed she has the 2 large areas last week. We put her in 4-layer compression for edema control is a lot better. Our intake nurse noted greenish drainage and odor. We have been using silver alginate 1/25; PCR culture I did have the substantial wound area on the left lateral lower leg showed staff aureus and group A strep. Low titers of coag negative staph which are probably skin contaminants. Resistance detected to tetracycline methicillin and macrolides. I gave her a starter kit of Nuzyra 150 mg x 3 for 2 days then 300 mg for a further 7 days. Marked odor considerable increase in surrounding erythema. We are using Iodoflex last week however I have changed to silver alginate with underlying Bactroban 2/1; she is completing her [place] tomorrow. The degree of erythema around the wounds looks a lot better. Odor has improved. I gave her silver alginate and Bactroban last week  and changing her back to Iodoflex to continue with ongoing debridement 2/8; the periwound looks a lot better. Surface of the wound also looks somewhat better although there is still  ongoing debridement to be done we have been using Iodoflex under compression. Primary dressing and silver alginate 2/15; left posterior calf. Some improvement in the surface of the wound but still very gritty we have been using Iodoflex under compression. 04/05/2021: Left lateral posterior calf. She continues to have significant amounts of drainage, some of which is probably comprised of the Iodoflex microbleeds. There is no odor to the drainage. The satellite lesion on the more posterior aspect of the calf is epithelializing nicely and has contracted quite a bit. There is robust granulation tissue in the dominant wound with minimal adherent slough. 04/12/2021: The satellite lesion has nearly closed. She continues to have good granulation tissue with minimal slough of the larger primary wound. She continues to have a fair amount of drainage, but I think this is less secondary to switching her dressing from Iodoflex to Prisma. 04/19/2021: The satellite lesion is almost completely epithelialized. The larger primary wound continues to contract with good granulation tissue and minimal slough. She is currently in [place] with compression. 04/26/2021: The satellite lesion has closed. The larger primary wound has contracted further and the granulation tissue is robust without being hypertrophic. Minimal slough is present. 05/03/2021: The satellite lesion remains closed. The larger primary wound has a bit of slough, but has also contracted further with good perimeter epithelialization. Good granulation tissue at the wound surface. There was some greenish drainage on the dressing when it was removed; it is not the blue-green typically associated with Pseudomonas aeruginosa, however. 05/10/2021: The primary wound continues to contract. There is minimal slough. The granulation tissue at 9:00 is a bit hypertrophic. No significant drainage. No odor. 05/17/2021: The wound is a little bit smaller today with good granulation  tissue. Minimal slough. No significant drainage or odor. 05/24/2021: The wound continues to contract and has a nice base of granulation tissue. Small amount of slough. No concern for infection. 05/31/2021: For some reason, the wound measured slightly larger today but overall it still appears to be in good condition with a nice base of granulation tissue and minimal slough. 06/07/2021: The wound is smaller today. Good granulation tissue and minimal slough. 06/14/2021: The wound is unchanged in size. She continues to accumulate some slough. Good granulation tissue on the surface. 06/20/2021: The wound is smaller today. Minimal slough with good granulation tissue. 06/27/2021: The wound on her left lateral leg is smaller today with just a bit of slough and eschar accumulation. Unfortunately, she has opened a new superficial wound on her right lower extremity. She has 3+ pitting edema to the knees on that leg and she does not wear compression stockings. 07/04/2021: In addition to the superficial wound on her right lower extremity that she opened up last week, she has opened 3 additional sites on the same leg. Her compression wraps were clearly not in correct position when she came to clinic today as they were at about the mid calf level rather than to the tibial tuberosity. She says that they slipped earlier and she had to come back on Friday to have them redone. The wound on her left lateral leg is perhaps slightly larger with some slough accumulation. 07/11/2021: The superficial wounds on her right lower extremity are nearly closed. They just have a thin layer of eschar overlying them. The wound on her left lateral  leg is a little bit shallower and has a bit of slough accumulation. 07/17/2021: The right medial lower extremity leg wounds have almost completely closed; one of them just has a tiny opening with a little bit of serous drainage. The left lateral leg wound is really unchanged. It seems to be  stalled. 07/24/2021: The right leg wounds are completely closed. The left lateral leg wound is actually bigger today. It is a bit more tender. There is a minor accumulation of slough on the surface. 07/31/2021: The culture that I took last week was positive for MRSA. We added mupirocin under the silver alginate and I prescribed doxycycline. She has been tolerating this well. The wound is smaller today but does have slough accumulation. No significant pain or drainage. 08/07/2021: She completed her course of doxycycline. The wound looks much better today. It is smaller and the periwound is less inflamed. She does have some slough accumulation on the surface. 08/14/2021: The wound continues to contract. There is slough on the wound surface, but the periwound is intact without inflammation or induration. 08/21/2021: The wound is down to just 2 small open sites. They both have a bit of slough accumulation. 08/28/2021: The wound is down to just 1 small open site with a little bit of slough and eschar accumulation. 09/04/2021: The wound continues to contract but remains open. It is clean without any slough. 09/12/2021: No real change in the overall wound dimensions, but it is flush with the surrounding skin. There is a little bit of slough accumulation. Edema control is good. 09/22/2021: The wound is smaller and even more superficial. Minimal slough accumulation. Good control of edema. 09/29/2021: The wound continues to contract. She reports that it was a little bit "twingey" during the week. It is a little bit dry on inspection today. Light accumulation of slough. Good edema control. 10/06/2021: The wound is about the same size today. There is a little slough on the surface. Edema control is good. [name], [name] ([id number]) [id number] Page 4 of 13 10/13/2021: The wound is about the same size but more superficial. A little bit of slough on the surface. 10/23/2021: The wound is slightly  smaller and continues to fill in. Minimal slough on the wound surface. 10/30/2021: The wound continues to contract but has not yet closed. Light slough and eschar present. 11/06/2021: The wound persists, but seems a little bit more epithelialized. There is still slough and eschar accumulation. 11/14/2021: The wound continues to contract but persists. Light eschar around the wound. 11/22/2021: The wound is down to just a pinhole opening. There is eschar overlying the surface. Edema control is excellent. 11/29/2021: Her wound is closed. READMISSION 01/22/2022 This is a patient to be discharged in October. She has severe chronic venous insufficiency at that time she had a wound on her left lower leg posteriorly also the right leg at some point. These closed. We discharged her in 30/40 mm stockings from elastic therapy which she has been wearing religiously. She tells us that she was traumatized by a dolly while shopping at [employer] about 2 to 3 weeks ago. She has been left with a left anterior lower leg wound. She comes in in another pair of stockings other than her 3040s. She does not have an arterial issue with her last ABI in the left at 1.2 12/18; this is a patient who has chronic venous insufficiency and recurrent venous insufficiency ulcers. She has a area on her left anterior lower leg and we readmitted her to  the clinic last week. We use silver alginate and 4-layer compression. ABI was 1.2 She brought in her stocking which was a 20/30 below-knee stocking from elastic therapy. She may require a 30/40 mm equivalent stockings after this wound heals. 02/06/2022: The intake nurse reported an odor coming from the wound when she first unwrapped it but this abated after the leg was washed. There is thick layer of slough on the surface. 02/13/2022: The wound is cleaner today. It measured larger, but on visual inspection, I think some crust of Iodoflex was included in the wound measurements; the actual wound  itself looks about the same to slightly smaller. Edema control is good. 02/20/2022: The wound is cleaner again today. It is also measuring a little bit smaller, but there has been some moisture related periwound breakdown. Edema control is good. 02/27/2022: The intake nurse reported that the wound measures larger, but some of this ended up being crusted on Iodoflex. There is still some periwound moisture. Still with thick slough accumulation. Edema control is good. 03/06/2022: The wound is cleaner and more superficial, but did measure larger because of the inclusion of a satellite area. Still with some slough accumulation but less so than on prior visits. 03/13/2022: The wound measured a little bit smaller today. There is slough and eschar accumulation, as per usual. 03/20/2022: Her wound is larger and deeper today. The entire surface appears nonviable. There is more periwound erythema and threatened tissue breakdown. 03/27/2022: The culture that I took last week was positive for MRSA. We had empirically applied topical mupirocin to her wound. Today the wound is smaller and cleaner and less painful. There is still a layer of slough on the surface. 04/03/2022: The wound was measured slightly larger today, but appears roughly the same to my eye. There is a layer of slough on the surface. Underneath, the tissue is quite fibrotic. 04/10/2022: The wound is slightly smaller today. There is slough and eschar accumulation. For some reason, her wrap got changed from 4 layers to 3 layers and her edema control is not as good, as a result. 04/17/2022: The return to true 4-layer compression has improved the wound considerably. It is much smaller and cleaner today. Edema control is excellent. 04/24/2022: Her wound continues to improve. It is smaller with just a bit of slough on the surface. Edema control is excellent. 05/08/2022: Her wound is smaller again today. There is still slough accumulation on the surface. Edema control  remains excellent. 05/15/2022: The wound is substantially smaller this week. There is slough on the surface with excellent edema control. 05/22/2022: Her wound is smaller again this week. Minimal slough on the surface. Good edema control. Electronic Signature(s) Signed: 05/22/2022 10:42:24 AM By: [name] MD FACS Entered By: [name] on 05/22/2022 10:42:24 -------------------------------------------------------------------------------- Physical Exam Details Patient Name: Date of Service: Erin Rosier NITA [name] 05/22/2022 9:30 A M Medical Record Number: [id number] Patient Account Number: [id number] Date of Birth/Sex: Treating RN: 08/10/1955 (67 y.o. F) Primary Care Provider: [name] Other Clinician: Referring Provider: Treating Provider/Extender: Jasmine Pang in Treatment: 17 Constitutional . . . . no acute distress. Respiratory MAEVA, ISA ([id number]) [id number] Page 5 of 13 Normal work of breathing on room air. Notes 05/22/2022: Her wound is smaller again this week. Minimal slough on the surface. Good edema control. Electronic Signature(s) Signed: 05/22/2022 10:43:08 AM By: [name] MD FACS Entered By: [name] on 05/22/2022 10:43:08 -------------------------------------------------------------------------------- Physician Orders Details Patient Name: Date of Service: Erin Rosier NITA  [name] 05/22/2022 9:30 A M Medical Record Number: [id number] Patient Account Number: [id number] Date of Birth/Sex: Treating RN: Sep 29, 1955 (67 y.o. 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 - Dr. Lady Gary Room 3 Return A Anesthetic (In clinic) Topical Lidocaine 5% applied to wound bed Bathing/ Shower/ Hygiene May shower with protection but do not get wound dressing(s) wet. Protect dressing(s) with water repellant cover (for example, large plastic bag) or a cast cover and may then take shower. - Please do not get the Left lower leg Compression wraps wet. Edema Control - Lymphedema / SCD / Other Avoid standing for long periods of time. Patient to wear own compression stockings every day. Exercise regularly Moisturize legs daily. Off-Loading Other: - Elevate feet on a pillow (behind the calf while in bed) Wound Treatment Wound #7 - Lower Leg Wound Laterality: Left, Anterior Cleanser: Soap and Water 1 x Per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 base. There is large (67-100%) pink granulation within the wound bed. There is a small (1-33%) amount of necrotic tissue within the wound bed including Adherent Slough. The periwound skin appearance had no abnormalities noted for texture.  The periwound skin appearance exhibited: Dry/Scaly, Maceration, Hemosiderin Staining. The periwound skin appearance did not exhibit: Erythema. Periwound temperature was noted as No Abnormality. 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 There was a Selective/Open Wound Non-Viable Tissue Debridement with a total area of 0.25 sq cm performed by [name], MD. With the following instrument(s): Curette to remove Non-Viable tissue/material. Material removed includes [facility] after achieving pain control using Lidocaine 5% topical ointment. No specimens were taken. A time out was conducted at 10:25, prior to the start of the procedure. A  Minimum amount of bleeding was controlled with Pressure. The procedure was tolerated well with a pain level of 0 throughout and a pain level of 0 following the procedure. Post Debridement Measurements: 0.5cm length x 0.5cm width x 0.2cm depth; 0.039cm^3 volume. Character of Wound/Ulcer Post Debridement is improved. Post procedure Diagnosis Wound #7: Same as Pre-Procedure General Notes: Scribed for Dr. Lady Gary by J.[name]. Pre-procedure diagnosis of Wound #7 is a Lymphedema located on the Left,Anterior Lower Leg . There was a Four Layer Compression Therapy Procedure by Karie Schwalbe, RN. Post procedure Diagnosis Wound #7: Same as Pre-Procedure Plan Follow-up Appointments: Return Appointment in 1 week. - Dr. Lady Gary Room 3 Anesthetic: (In clinic) Topical Lidocaine 5% applied to wound bed Bathing/ Shower/ Hygiene: May shower with protection but do not get wound dressing(s) wet. Protect dressing(s) with water repellant cover (for example, large plastic bag) or a cast cover and may then  take shower. - Please do not get the Left lower leg Compression wraps wet. Edema Control - Lymphedema / SCD / Other: Avoid standing for long periods of time. Patient to wear own compression stockings every day. Exercise regularly Moisturize legs daily. Off-Loading: Other: - Elevate feet on a pillow (behind the calf while in bed) WOUND #7: - Lower Leg Wound Laterality: Left, Anterior Cleanser: Soap and Water 1 x Per Week/30 Days Discharge Instructions: May shower and wash wound with dial antibacterial soap and water prior to dressing change. Cleanser: Wound Cleanser 1 x Per Week/30 Days Discharge Instructions: Cleanse the wound with wound cleanser prior to applying a clean dressing using gauze sponges, not tissue or cotton balls. Peri-Wound Care: Ketoconazole Cream 2% 1 x Per Week/30 Days Discharge Instructions: Apply Ketoconazole as directed Peri-Wound Care: Triamcinolone 15 (g) 1 x Per Week/30 Days Discharge Instructions: Use triamcinolone 15 (g) as directed Peri-Wound Care: Zinc Oxide Ointment 30g tube 1 x Per Week/30 Days Discharge Instructions: Apply Zinc Oxide to periwound with each dressing change Peri-Wound Care: Sween Lotion (Moisturizing lotion) 1 x Per Week/30 Days Discharge Instructions: Apply moisturizing lotion as directed Topical: Mupirocin Ointment 1 x Per Week/30 Days Discharge Instructions: Apply Mupirocin (Bactroban) as instructed [name], [name] ([id number]) [id number] Page 11 of 13 Prim Dressing: Sorbalgon [employer], 4x4 (in/in) 1 x Per Week/30 Days ary Discharge Instructions: Apply to wound bed as instructed Secondary Dressing: Woven Gauze Sponge, Non-Sterile 4x4 in 1 x Per Week/30 Days Discharge Instructions: Apply over primary dressing as directed. Secondary Dressing: Zetuvit Plus 4x8 in 1 x Per Week/30 Days Discharge Instructions: Apply over primary dressing as directed. Com pression Wrap: FourPress (4 layer compression wrap) 1 x  Per Week/30 Days Discharge Instructions: Apply four layer compression as directed. May also use Urgo K2 compression system as alternative. Com pression Wrap: Netting #5 1 x Per Week/30 Days 05/22/2022: Her wound is smaller again this week. Minimal slough on the surface. Good edema control. I used a curette to debride slough from the wound surface. We will continue topical mupirocin with silver alginate and 4-layer compression. We will apply triamcinolone mixed with zinc oxide and ketoconazole to the periwound. Follow-up in 1 week. Electronic Signature(s) Signed: 05/22/2022 10:44:12 AM By: [name] MD FACS Entered By: [name] on 05/22/2022 10:44:12 -------------------------------------------------------------------------------- HxROS Details Patient Name: Date of Service: Erin Rosier NITA [name] 05/22/2022 9:30 A M Medical Record Number: [id number] Patient Account Number: [id number] Date of Birth/Sex: Treating RN: [birth year] (67 y.o. F) Primary Care Provider: [name] Other Clinician: Referring Provider: Treating Provider/Extender: Shirlean Schlein, Daryl Eastern  in Treatment: 17 Information Obtained From Patient Constitutional Symptoms (General Health) Medical History: Past Medical History Notes: morbid obesity Eyes Medical History: Positive for: Cataracts Hematologic/Lymphatic Medical History: Positive for: Lymphedema Cardiovascular Medical History: Positive for: Hypertension; Peripheral Venous Disease Past Medical History Notes: schamberg disease, hyperlipidemia Gastrointestinal Medical History: Past Medical History Notes: diverticulitis , h/o obstruction due to diverticulitis , colostomy and colostomy reversal Endocrine Medical History: Past Medical History Notes: hypothyroidism Integumentary (Skin) Medical History: Negative for: History [address] TYLLER, GRUHLKE ([id number]) [id number] Page 12 of  13 Musculoskeletal Medical History: Positive for: Osteoarthritis HBO Extended History Items Eyes: Cataracts Immunizations Pneumococcal Vaccine: Received Pneumococcal Vaccination: Yes Received Pneumococcal Vaccination On or After 60th Birthday: Yes Implantable Devices None Hospitalization / Surgery History Type of Hospitalization/Surgery colostomy reversal 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 Family and Social History Cancer: No; Diabetes: No; Heart Disease: Yes - Mother,Father; Hereditary Spherocytosis: No; Hypertension: Yes - Mother,Father; Kidney Disease: Yes - Mother; Lung Disease: No; Seizures: No; Stroke: No; Thyroid Problems: Yes - Mother; Tuberculosis: No; Never smoker; Marital Status - Single; Alcohol Use: Never; Drug Use: No History; Caffeine Use: Daily - coffee; Financial Concerns: No; Food, Clothing or Shelter Needs: No; Support System Lacking: No; Transportation Concerns: No Electronic Signature(s) Signed: 05/22/2022 12:21:18 PM By: [name] MD FACS Entered By: [name] on 05/22/2022 10:42:41 -------------------------------------------------------------------------------- SuperBill Details Patient Name: Date of Service: Erin Rosier NITA [name] 05/22/2022 Medical Record Number: [id number] Patient Account Number: [id number] Date of Birth/Sex: Treating RN: [birth year] (67 y.o. 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 with  other specif Modifier: ied severity Quantity: 1 Physician Procedures : CPT4 Code Description Modifier [id number] 99214 - WC PHYS LEVEL 4 - EST PT 25 ICD-10 Diagnosis Description L97.928 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

## 2022-05-23 NOTE — Progress Notes (Signed)
Erin RoundRUSSELL, [name] ([id number]) [id number] Page 1 of 7 Visit Report for 05/22/2022 Arrival Information Details Patient Name: Date of Service: [name], Erin NITA [name] 05/22/2022 9:30 A M Medical Record Number: [id number] Patient Account Number: [id number] Date of Birth/Sex: Treating RN: [birth year] (67 y.o. Katrinka BlazingF) [name], Joanne Primary Care Kanyia Heaslip: Dorothyann PengSanders, Robyn Other Clinician: Referring Jamesha Ellsworth: Treating Shereece Wellborn/Extender: Jasmine Pangannon, Jennifer Sanders, Robyn Weeks in Treatment: 17 Visit Information History Since Last Visit Added or deleted any medications: No Patient Arrived: Erin MorCane Any new allergies or adverse reactions: No Arrival Time: 10:03 Had a fall or experienced change in No Accompanied By: self activities of daily living that may affect Transfer Assistance: None risk of falls: Patient Requires Transmission-Based Precautions: No Signs or symptoms of abuse/neglect since last visito No Patient Has Alerts: No Hospitalized since last visit: No Implantable device outside of the clinic excluding No cellular tissue based products placed in the center since last visit: Has Dressing in Place as Prescribed: Yes Pain Present Now: No Electronic Signature(s) Signed: 05/22/2022 5:01:04 PM By: Karie SchwalbeScotton, Joanne RN Entered By: Karie SchwalbeScotton, Joanne on 05/22/2022 10:04:54 -------------------------------------------------------------------------------- Compression Therapy Details Patient Name: Date of Service: [name], Erin NITA [name] 05/22/2022 9:30 A M Medical Record Number: [id number] Patient Account Number: [id number] Date of Birth/Sex: Treating RN: [birth year] (67 y.o. Katrinka BlazingF) [name], Joanne Primary Care Keelie Zemanek: Dorothyann PengSanders, Robyn Other Clinician: Referring Glendale Youngblood: Treating Kindle Strohmeier/Extender: Jasmine Pangannon, Jennifer Sanders, Robyn Weeks in Treatment: 17 Compression Therapy Performed for Wound Assessment: Wound #7 Left,Anterior Lower Leg Performed By: Clinician Karie SchwalbeScotton, Joanne, RN Compression  Type: Four Layer Post Procedure Diagnosis Same as Pre-procedure Electronic Signature(s) Signed: 05/22/2022 5:01:04 PM By: Karie SchwalbeScotton, Joanne RN Entered By: Karie SchwalbeScotton, Joanne on 05/22/2022 10:28:34 -------------------------------------------------------------------------------- Encounter Discharge Information Details Patient Name: Date of Service: [name], Erin NITA [name] 05/22/2022 9:30 A M Medical Record Number: [id number] Patient Account Number: [id number] Date of Birth/Sex: Treating RN: [birth year] ([age] y.o. Katrinka BlazingF) [name], Joanne Primary Care Kristyne Woodring: Dorothyann PengSanders, Robyn Other Clinician: Referring Unika Nazareno: Treating Chrisa Hassan/Extender: Jasmine Pangannon, Jennifer Sanders, Robyn Weeks in Treatment: [phone] Encounter Discharge Information Items Post Procedure Vitals Discharge Condition: Stable Temperature (F): 97.8 Ambulatory Status: Cane Pulse (bpm): 68 Discharge Destination: Home Respiratory Rate (breaths/min): 18 Transportation: Private Auto Blood Pressure (mmHg): 126/69 Accompanied By: self Erin RoundUSSELL, [name] ([id number]) [id number] Page 2 of 7 Schedule Follow-up Appointment: Yes Clinical Summary of Care: Patient Declined Electronic Signature(s) Signed: 05/22/2022 5:01:04 PM By: Karie SchwalbeScotton, Joanne RN Entered By: Karie SchwalbeScotton, Joanne on 05/22/2022 16:50:22 -------------------------------------------------------------------------------- Lower Extremity Assessment Details Patient Name: Date of Service: Erin PhilipsRUSSELL, Erin NITA [name] 05/22/2022 9:30 A M Medical Record Number: [id number] Patient Account Number: [id number] Date of Birth/Sex: Treating RN: [birth year] (67 y.o. Katrinka BlazingF) [name], Joanne Primary Care Vertie Dibbern: Dorothyann PengSanders, Robyn Other Clinician: Referring Erastus Bartolomei: Treating Annya Lizana/Extender: Jasmine Pangannon, Jennifer Sanders, Robyn Weeks in Treatment: 17 Edema Assessment Assessed: [Left: No] [Right: No] [Left: Edema] [Right: :] Calf Left: Right: Point of Measurement: From Medial Instep 37 cm Ankle Left:  Right: Point of Measurement: From Medial Instep 21.9 cm Vascular Assessment Pulses: Dorsalis Pedis Palpable: [Left:Yes] Electronic Signature(s) Signed: 05/22/2022 5:01:04 PM By: Karie SchwalbeScotton, Joanne RN Entered By: Karie SchwalbeScotton, Joanne on 05/22/2022 10:14:34 -------------------------------------------------------------------------------- Multi Wound Chart Details Patient Name: Date of Service: [name], Erin NITA [name] 05/22/2022 9:30 A M Medical Record Number: [id number] Patient Account Number: [id number] Date of Birth/Sex: Treating RN: [birth year] (67 y.o. F) Primary Care Quetzaly Ebner: Dorothyann PengSanders, Robyn Other Clinician: Referring Zhaire Locker: Treating Heyden Jaber/Extender: Jasmine Pangannon, Jennifer Sanders, Robyn Weeks in Treatment: 17 Vital Signs Height(in): 62 Pulse(bpm): 68 Weight(lbs): 222 Blood Pressure(mmHg): 126/69 Body Mass Index(BMI): 40.6 Temperature(F): 97.8 Respiratory  Rate(breaths/min): 18 [7:Photos:] [N/A:N/A] Left, Anterior Lower Leg N/A N/A Wound Location: Blister N/A N/A Wounding Event: Lymphedema N/A N/A Primary Etiology: Cataracts, Lymphedema, N/A N/A Comorbid History: Hypertension, Peripheral Venous Disease, Osteoarthritis 01/22/2022 N/A N/A Date Acquired: 17 N/A N/A Weeks of Treatment: Open N/A N/A Wound Status: No N/A N/A Wound Recurrence: 0.5x0.5x0.2 N/A N/A Measurements L x W x [name] 0.196 N/A N/A A (cm) : rea 0.039 N/A N/A Volume (cm) : 93.70% N/A N/A % Reduction in A rea: 93.70% N/A N/A % Reduction in Volume: Full Thickness Without Exposed N/A N/A Classification: Support Structures Medium N/A N/A Exudate A mount: Serosanguineous N/A N/A Exudate Type: red, brown N/A N/A Exudate Color: Distinct, outline attached N/A N/A Wound Margin: Large (67-100%) N/A N/A Granulation A mount: Pink N/A N/A Granulation Quality: Small (1-33%) N/A N/A Necrotic A mount: Fat Layer (Subcutaneous Tissue): Yes N/A N/A Exposed Structures: Fascia: No Tendon: No Muscle:  No Joint: No Bone: No Small (1-33%) N/A N/A Epithelialization: Debridement - Selective/Open Wound N/A N/A Debridement: Pre-procedure Verification/Time Out 10:25 N/A N/A Taken: Lidocaine 5% topical ointment N/A N/A Pain Control: Slough N/A N/A Tissue Debrided: Non-Viable Tissue N/A N/A Level: 0.25 N/A N/A Debridement A (sq cm): rea Curette N/A N/A Instrument: Minimum N/A N/A Bleeding: Pressure N/A N/A Hemostasis A chieved: 0 N/A N/A Procedural Pain: 0 N/A N/A Post Procedural Pain: Procedure was tolerated well N/A N/A Debridement Treatment Response: 0.5x0.5x0.2 N/A N/A Post Debridement Measurements L x W x [name] 0.039 N/A N/A Post Debridement Volume: (cm) No Abnormalities Noted N/A N/A Periwound Skin Texture: Maceration: Yes N/A N/A Periwound Skin Moisture: Dry/Scaly: Yes Hemosiderin Staining: Yes N/A N/A Periwound Skin Color: Erythema: No No Abnormality N/A N/A Temperature: Compression Therapy N/A N/A Procedures Performed: Debridement Treatment Notes Electronic Signature(s) Signed: 05/22/2022 10:41:55 AM By: [name] MD FACS Entered By: [name] on 05/22/2022 10:41:54 -------------------------------------------------------------------------------- Multi-Disciplinary Care Plan Details Patient Name: Date of Service: Erin Philips [name] 05/22/2022 9:30 A M Medical Record Number: [id number] Patient Account Number: [id number] Date of Birth/Sex: Treating RN: [birth year] (67 y.o. [name] Primary Care Taniqua Issa: [name] Other Clinician: Referring Britian Jentz: Treating Kervens Roper/Extender: Jasmine Pang in Treatment: 17 Active Inactive Abuse / Safety / Falls / Self Care Management [name], [name] ([id number]) [id number] Page 4 of 7 Nursing Diagnoses: History of Falls Impaired physical mobility Goals: Patient/caregiver will identify factors that restrict self-care and home  management Date Initiated: 01/22/2022 Target Resolution Date: 09/11/2022 Goal Status: Active Interventions: Assess fall risk on admission and as needed Assess: immobility, friction, shearing, incontinence upon admission and as needed Notes: Wound/Skin Impairment Nursing Diagnoses: Impaired tissue integrity Knowledge deficit related to ulceration/compromised skin integrity Goals: Patient/caregiver will verbalize understanding of skin care regimen Date Initiated: 01/22/2022 Target Resolution Date: 09/11/2022 Goal Status: Active Interventions: Assess ulceration(s) every visit Treatment Activities: Skin care regimen initiated : 01/22/2022 Topical wound management initiated : 01/22/2022 Notes: Electronic Signature(s) Signed: 05/22/2022 5:01:04 PM By: Karie Schwalbe RN Entered By: Karie Schwalbe on 05/22/2022 16:48:42 -------------------------------------------------------------------------------- Pain Assessment Details Patient Name: Date of Service: Erin Philips [name] 05/22/2022 9:30 A M Medical Record Number: [id number] Patient Account Number: [id number] Date of Birth/Sex: Treating RN: [birth year] (67 y.o. [name] Primary Care Kedron Uno: [name] Other Clinician: Referring Eden Rho: Treating Jonty Morrical/Extender: Jasmine Pang in Treatment: 17 Active Problems Location of Pain Severity and Description of Pain Patient Has Paino No Site Locations Pain Management and Medication [name], [name] ([id number]) [id number] Page 5 of  7 Current Pain Management: Electronic Signature(s) Signed: 05/22/2022 5:01:04 PM By: Karie Schwalbe RN Entered By: Karie Schwalbe on 05/22/2022 10:05:20 -------------------------------------------------------------------------------- Patient/Caregiver Education Details Patient Name: Date of Service: [name] 4/9/2024andnbsp9:30 A M Medical Record Number: [id number] Patient Account  Number: [id number] Date of Birth/Gender: Treating RN: April 17, 1955 (67 y.o. [name] Primary Care Physician: [name] Other Clinician: Referring Physician: Treating Physician/Extender: Jasmine Pang in Treatment: 17 Education Assessment Education Provided To: Patient Education Topics Provided Wound/Skin Impairment: Methods: Explain/Verbal Responses: Return demonstration correctly Electronic Signature(s) Signed: 05/22/2022 5:01:04 PM By: Karie Schwalbe RN Entered By: Karie Schwalbe on 05/22/2022 16:49:03 -------------------------------------------------------------------------------- Wound Assessment Details Patient Name: Date of Service: Erin Philips [name] 05/22/2022 9:30 A M Medical Record Number: [id number] Patient Account Number: [id number] Date of Birth/Sex: Treating RN: 07-25-1955 (67 y.o. [name] Primary Care Lekesha Claw: [name] Other Clinician: Referring Jaquae Rieves: Treating Myrna Vonseggern/Extender: Jasmine Pang in Treatment: 17 Wound Status Wound Number: 7 Primary Lymphedema Etiology: Wound Location: Left, Anterior Lower Leg Wound Open Wounding Event: Blister Status: Date Acquired: 01/22/2022 Comorbid Cataracts, Lymphedema, Hypertension, Peripheral Venous Weeks Of Treatment: 17 History: Disease, Osteoarthritis Clustered Wound: No Photos Wound Measurements Length: (cm) 0.5 Width: (cm) 0.5 Schappert, [name] ([id number]) Depth: (cm) 0.2 Area: (cm) 0.196 Volume: (cm) 0.039 % Reduction in Area: 93.7% % Reduction in Volume: 93.7% [id number] Page 6 of 7 Epithelialization: Small (1-33%) Tunneling: No Undermining: No Wound Description Classification: Full Thickness Without Exposed Support Structures Wound Margin: Distinct, outline attached Exudate Amount: Medium Exudate Type: Serosanguineous Exudate Color: red, brown Foul Odor After Cleansing: No Slough/Fibrino  Yes Wound Bed Granulation Amount: Large (67-100%) Exposed Structure Granulation Quality: Pink Fascia Exposed: No Necrotic Amount: Small (1-33%) Fat Layer (Subcutaneous Tissue) Exposed: Yes Necrotic Quality: Adherent Slough Tendon Exposed: No Muscle Exposed: No Joint Exposed: No Bone Exposed: No Periwound Skin Texture Texture Color No Abnormalities Noted: Yes No Abnormalities Noted: No Erythema: No Moisture Hemosiderin Staining: Yes No Abnormalities Noted: No Dry / Scaly: Yes Temperature / Pain Maceration: Yes Temperature: No Abnormality Treatment Notes Wound #7 (Lower Leg) Wound Laterality: Left, Anterior Cleanser Soap and Water Discharge Instruction: May shower and wash wound with dial antibacterial soap and water prior to dressing change. Wound Cleanser Discharge Instruction: Cleanse the wound with wound cleanser prior to applying a clean dressing using gauze sponges, not tissue or cotton balls. Peri-Wound Care Ketoconazole Cream 2% Discharge Instruction: Apply Ketoconazole as directed Triamcinolone 15 (g) Discharge Instruction: Use triamcinolone 15 (g) as directed Zinc Oxide Ointment 30g tube Discharge Instruction: Apply Zinc Oxide to periwound with each dressing change Sween Lotion (Moisturizing lotion) Discharge Instruction: Apply moisturizing lotion as directed Topical Mupirocin Ointment Discharge Instruction: Apply Mupirocin (Bactroban) as instructed Primary Dressing Sorbalgon AG Dressing, 4x4 (in/in) Discharge Instruction: Apply to wound bed as instructed Secondary Dressing Woven Gauze Sponge, Non-Sterile 4x4 in Discharge Instruction: Apply over primary dressing as directed. Zetuvit Plus 4x8 in Discharge Instruction: Apply over primary dressing as directed. Secured With Compression Wrap FourPress (4 layer compression wrap) Discharge Instruction: Apply four layer compression as directed. May also use Urgo K2 compression system as alternative. Netting  #5 Compression Stockings Add-Ons HAWRAA, KOBZA ([id number]) [id number] Page 7 of 7 Electronic Signature(s) Signed: 05/22/2022 5:01:04 PM By: Karie Schwalbe RN Entered By: Karie Schwalbe on 05/22/2022 10:17:18 -------------------------------------------------------------------------------- Vitals Details Patient Name: Date of Service: Earnest Rosier NITA [name] 05/22/2022 9:30 A M Medical Record Number: [id number] Patient Account Number: [id number] Date of Birth/Sex: Treating RN: 14-Jul-1955 (  67 y.o. [name] Primary Care Dutchess Crosland: [name] Other Clinician: Referring Arlinda Barcelona: Treating Kaylamarie Swickard/Extender: Jasmine Pang in Treatment: 17 Vital Signs Time Taken: 10:03 Temperature (F): 97.8 Height (in): 62 Pulse (bpm): 68 Weight (lbs): 222 Respiratory Rate (breaths/min): 18 Body Mass Index (BMI): 40.6 Blood Pressure (mmHg): 126/69 Reference Range: 80 - 120 mg / dl Electronic Signature(s) Signed: 05/22/2022 5:01:04 PM By: Karie Schwalbe RN Entered By: Karie Schwalbe on 05/22/2022 10:21:30

## 2022-05-24 ENCOUNTER — Other Ambulatory Visit: Payer: Self-pay | Admitting: Internal Medicine

## 2022-05-25 ENCOUNTER — Other Ambulatory Visit (HOSPITAL_COMMUNITY): Payer: Self-pay

## 2022-05-25 MED ORDER — LEVOTHYROXINE SODIUM 112 MCG PO TABS
112.0000 ug | ORAL_TABLET | Freq: Every day | ORAL | 1 refills | Status: DC
  Filled 2022-05-25: qty 90, 90d supply, fill #0
  Filled 2022-08-24: qty 90, 90d supply, fill #1

## 2022-05-29 ENCOUNTER — Encounter (HOSPITAL_BASED_OUTPATIENT_CLINIC_OR_DEPARTMENT_OTHER): Payer: Commercial Managed Care - PPO | Admitting: General Surgery

## 2022-05-29 DIAGNOSIS — L97928 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: Secondary | ICD-10-CM | POA: Diagnosis not present

## 2022-05-29 DIAGNOSIS — I890 Lymphedema, not elsewhere classified: Secondary | ICD-10-CM | POA: Diagnosis not present

## 2022-05-29 DIAGNOSIS — I87312 Chronic venous hypertension (idiopathic) with ulcer of left lower extremity: Secondary | ICD-10-CM | POA: Diagnosis not present

## 2022-05-29 NOTE — Progress Notes (Signed)
[name], [name] ([id number]) [id number] Page 1 of 7 Visit Report for 05/29/2022 Arrival Information Details Patient Name: Date of Service: [name], [name] 05/29/2022 9:30 A M Medical Record Number: [id number] Patient Account Number: [id number] Date of Birth/Sex: Treating RN: 07/22/1955 (67 y.o. [name] Primary Care Ronin Rehfeldt: [name] Other Clinician: Referring Agnes Probert: Treating Cabella Kimm/Extender: Jasmine Pang in Treatment: 18 Visit Information History Since Last Visit Added or deleted any medications: No Patient Arrived: [name] Any new allergies or adverse reactions: No Arrival Time: 09:49 Had a fall or experienced change in No Accompanied By: self activities of daily living that may affect Transfer Assistance: None risk of falls: Patient Requires Transmission-Based Precautions: No Signs or symptoms of abuse/neglect since last visito No Patient Has Alerts: No Hospitalized since last visit: No Implantable device outside of the clinic excluding No cellular tissue based products placed in the center since last visit: Has Dressing in Place as Prescribed: Yes Has Compression in Place as Prescribed: Yes Pain Present Now: Yes Electronic Signature(s) Signed: 05/29/2022 3:59:59 PM By: Karie Schwalbe RN Entered By: Karie Schwalbe on 05/29/2022 09:50:32 -------------------------------------------------------------------------------- Compression Therapy Details Patient Name: Date of Service: Erin Philips [name] 05/29/2022 9:30 A M Medical Record Number: [id number] Patient Account Number: [id number] Date of Birth/Sex: Treating RN: [birth year] (67 y.o. [name] Primary Care Lacosta Hargan: [name] Other Clinician: Referring Metzli Pollick: Treating Elyana Grabski/Extender: Jasmine Pang in Treatment: 18 Compression Therapy Performed for Wound Assessment: Wound #7 Left,Anterior Lower  Leg Performed By: Clinician Karie Schwalbe, RN Compression Type: Four Layer Post Procedure Diagnosis Same as Pre-procedure Notes Or URGO K2 compression wraps Electronic Signature(s) Signed: 05/29/2022 3:59:59 PM By: Karie Schwalbe RN Entered By: Karie Schwalbe on 05/29/2022 10:20:29 -------------------------------------------------------------------------------- Encounter Discharge Information Details Patient Name: Date of Service: Erin Philips [name] 05/29/2022 9:30 A M Medical Record Number: [id number] Patient Account Number: [id number] Date of Birth/Sex: Treating RN: 04/06/1955 (67 y.o. [name] Primary Care Jaunita Mikels: [name] Other Clinician: Referring Bria Sparr: Treating Nels Munn/Extender: Jasmine Pang in Treatment: [id number] Encounter Discharge Information Items Post Procedure Vitals Discharge Condition: Stable Temperature (F): [address] [name] ([id number]) [phone].pdf Page 2 of 7 Ambulatory Status: Cane Pulse (bpm): 76 Discharge Destination: Home Respiratory Rate (breaths/min): 18 Transportation: Private Auto Blood Pressure (mmHg): 108/72 Accompanied By: self Schedule Follow-up Appointment: Yes Clinical Summary of Care: Patient Declined Electronic Signature(s) Signed: 05/29/2022 3:59:59 PM By: Karie Schwalbe RN Entered By: Karie Schwalbe on 05/29/2022 15:58:09 -------------------------------------------------------------------------------- Lower Extremity Assessment Details Patient Name: Date of Service: [name], [name] 05/29/2022 9:30 A M Medical Record Number: [id number] Patient Account Number: [id number] Date of Birth/Sex: Treating RN: [birth year] (67 y.o. [name] Primary Care Keisy Strickler: [name] Other Clinician: Referring Lucien Budney: Treating Khylei Wilms/Extender: Jasmine Pang in Treatment: 18 Edema Assessment Assessed: [Left: No] [Right: No] [Left: Edema]  [Right: :] Calf Left: Right: Point of Measurement: From Medial Instep 36.8 cm Ankle Left: Right: Point of Measurement: From Medial Instep 21.7 cm Vascular Assessment Pulses: Dorsalis Pedis Palpable: [Left:Yes] Electronic Signature(s) Signed: 05/29/2022 3:59:59 PM By: Karie Schwalbe RN Entered By: Karie Schwalbe on 05/29/2022 09:58:30 -------------------------------------------------------------------------------- Multi Wound Chart Details Patient Name: Date of Service: Erin Philips [name] 05/29/2022 9:30 A M Medical Record Number: [id number] Patient Account Number: [id number] Date of Birth/Sex: Treating RN: August 28, 1955 (67 y.o. F) Primary Care Jniyah Dantuono: [name] Other Clinician: Referring Christ Fullenwider: Treating Evette Diclemente/Extender: Jasmine Pang in Treatment: 18 Vital Signs Height(in): 62 Pulse(bpm):  76 Weight(lbs): 222 Blood Pressure(mmHg): 108/72 Body Mass Index(BMI): 40.6 Temperature(F): 98 Respiratory Rate(breaths/min): 18 [7:Photos:] [N/A:N/A] Left, Anterior Lower Leg N/A N/A Wound Location: Blister N/A N/A Wounding Event: Lymphedema N/A N/A Primary Etiology: Cataracts, Lymphedema, N/A N/A Comorbid History: Hypertension, Peripheral Venous Disease, Osteoarthritis 01/22/2022 N/A N/A Date Acquired: 30 N/A N/A Weeks of Treatment: Open N/A N/A Wound Status: No N/A N/A Wound Recurrence: 0.3x0.3x0.1 N/A N/A Measurements L x W x [name] 0.071 N/A N/A A (cm) : rea 0.007 N/A N/A Volume (cm) : 97.70% N/A N/A % Reduction in A rea: 98.90% N/A N/A % Reduction in Volume: Full Thickness Without Exposed N/A N/A Classification: Support Structures Medium N/A N/A Exudate A mount: Serosanguineous N/A N/A Exudate Type: red, brown N/A N/A Exudate Color: Distinct, outline attached N/A N/A Wound Margin: Large (67-100%) N/A N/A Granulation A mount: Pink N/A N/A Granulation Quality: Small (1-33%) N/A N/A Necrotic A mount: Fat Layer  (Subcutaneous Tissue): Yes N/A N/A Exposed Structures: Fascia: No Tendon: No Muscle: No Joint: No Bone: No Small (1-33%) N/A N/A Epithelialization: Debridement - Selective/Open Wound N/A N/A Debridement: Pre-procedure Verification/Time Out 10:15 N/A N/A Taken: Lidocaine 4% Topical Solution N/A N/A Pain Control: Necrotic/Eschar, Slough N/A N/A Tissue Debrided: Non-Viable Tissue N/A N/A Level: 0.09 N/A N/A Debridement A (sq cm): rea Curette N/A N/A Instrument: Minimum N/A N/A Bleeding: Pressure N/A N/A Hemostasis A chieved: 0 N/A N/A Procedural Pain: 0 N/A N/A Post Procedural Pain: Procedure was tolerated well N/A N/A Debridement Treatment Response: 0.3x0.3x0.1 N/A N/A Post Debridement Measurements L x W x [name] 0.007 N/A N/A Post Debridement Volume: (cm) No Abnormalities Noted N/A N/A Periwound Skin Texture: Maceration: Yes N/A N/A Periwound Skin Moisture: Dry/Scaly: Yes Hemosiderin Staining: Yes N/A N/A Periwound Skin Color: Erythema: No No Abnormality N/A N/A Temperature: Compression Therapy N/A N/A Procedures Performed: Debridement Treatment Notes Electronic Signature(s) Signed: 05/29/2022 10:48:36 AM By: [name] MD FACS Entered By: [name] on 05/29/2022 10:48:36 -------------------------------------------------------------------------------- Multi-Disciplinary Care Plan Details Patient Name: Date of Service: Erin Philips [name] 05/29/2022 9:30 A M Medical Record Number: [id number] Patient Account Number: [id number] Date of Birth/Sex: Treating RN: December 14, 1955 (67 y.o. [name] Primary Care Tyiana Hill: [name] Other Clinician: Referring Francisca Harbuck: Treating Apolonia Ellwood/Extender: Jasmine Pang in Treatment: [address] [place] [name] ([id number]) [id number] Page 4 of 7 Active Inactive Abuse / Safety / Falls / Self Care Management Nursing Diagnoses: History of Falls Impaired  physical mobility Goals: Patient/caregiver will identify factors that restrict self-care and home management Date Initiated: 01/22/2022 Target Resolution Date: 09/11/2022 Goal Status: Active Interventions: Assess fall risk on admission and as needed Assess: immobility, friction, shearing, incontinence upon admission and as needed Notes: Wound/Skin Impairment Nursing Diagnoses: Impaired tissue integrity Knowledge deficit related to ulceration/compromised skin integrity Goals: Patient/caregiver will verbalize understanding of skin care regimen Date Initiated: 01/22/2022 Target Resolution Date: 09/11/2022 Goal Status: Active Interventions: Assess ulceration(s) every visit Treatment Activities: Skin care regimen initiated : 01/22/2022 Topical wound management initiated : 01/22/2022 Notes: Electronic Signature(s) Signed: 05/29/2022 3:59:59 PM By: Karie Schwalbe RN Entered By: Karie Schwalbe on 05/29/2022 10:35:36 -------------------------------------------------------------------------------- Pain Assessment Details Patient Name: Date of Service: KADESIA, ROBEL [name] 05/29/2022 9:30 A M Medical Record Number: [id number] Patient Account Number: [id number] Date of Birth/Sex: Treating RN: [birth year] (67 y.o. [name] Primary Care Tasheem Elms: [name] Other Clinician: Referring Danyael Alipio: Treating Abimael Zeiter/Extender: Jasmine Pang in Treatment: 18 Active Problems Location of Pain Severity and Description of Pain Patient Has Paino Yes  Site Locations Pain Location: [place], [place] ([id number]) [id number] Page 5 of 7 Pain Location: Generalized Pain With Dressing Change: No Duration of the Pain. Constant / Intermittento Constant Rate the pain. Current Pain Level: 4 Worst Pain Level: 10 Least Pain Level: 3 Tolerable Pain Level: 4 Pain Management and Medication Current Pain Management: Medication: Yes Cold  Application: No Rest: Yes Massage: No Activity: No T.E.N.S.: No Heat Application: No Leg drop or elevation: No Is the Current Pain Management Adequate: Adequate How does your wound impact your activities of daily livingo Sleep: No Bathing: No Appetite: No Relationship With Others: No Bladder Continence: No Emotions: No Bowel Continence: No Work: No Toileting: No Drive: No Dressing: No Hobbies: No Electronic Signature(s) Signed: 05/29/2022 3:59:59 PM By: Karie Schwalbe RN Entered By: Karie Schwalbe on 05/29/2022 09:57:58 -------------------------------------------------------------------------------- Patient/Caregiver Education Details Patient Name: Date of Service: [name] 4/16/2024andnbsp9:30 A M Medical Record Number: [id number] Patient Account Number: [id number] Date of Birth/Gender: Treating RN: [birth year] (67 y.o. [name] Primary Care Physician: [name] Other Clinician: Referring Physician: Treating Physician/Extender: Jasmine Pang in Treatment: 18 Education Assessment Education Provided To: Patient Education Topics Provided Wound/Skin Impairment: Methods: Explain/Verbal Responses: Return demonstration correctly Electronic Signature(s) Signed: 05/29/2022 3:59:59 PM By: Karie Schwalbe RN Entered By: Karie Schwalbe on 05/29/2022 10:18:14 -------------------------------------------------------------------------------- Wound Assessment Details Patient Name: Date of Service: Erin Philips [name] 05/29/2022 9:30 A M Medical Record Number: [id number] Patient Account Number: [id number] [name], [name] ([id number]) [id number].pdf Page 6 of 7 Date of Birth/Sex: Treating RN: 25-Jan-1956 (67 y.o. [name] Primary Care Amylia Collazos: Other Clinician: [name] Referring Malesha Suliman: Treating Rainee Sweatt/Extender: Jasmine Pang in Treatment: 18 Wound Status Wound  Number: 7 Primary Lymphedema Etiology: Wound Location: Left, Anterior Lower Leg Wound Open Wounding Event: Blister Status: Date Acquired: 01/22/2022 Comorbid Cataracts, Lymphedema, Hypertension, Peripheral Venous Weeks Of Treatment: 18 History: Disease, Osteoarthritis Clustered Wound: No Photos Wound Measurements Length: (cm) 0.3 Width: (cm) 0.3 Depth: (cm) 0.1 Area: (cm) 0.071 Volume: (cm) 0.007 % Reduction in Area: 97.7% % Reduction in Volume: 98.9% Epithelialization: Small (1-33%) Tunneling: No Undermining: No Wound Description Classification: Full Thickness Without Exposed Support Structures Wound Margin: Distinct, outline attached Exudate Amount: Medium Exudate Type: Serosanguineous Exudate Color: red, brown Foul Odor After Cleansing: No Slough/Fibrino Yes Wound Bed Granulation Amount: Large (67-100%) Exposed Structure Granulation Quality: Pink Fascia Exposed: No Necrotic Amount: Small (1-33%) Fat Layer (Subcutaneous Tissue) Exposed: Yes Necrotic Quality: Adherent Slough Tendon Exposed: No Muscle Exposed: No Joint Exposed: No Bone Exposed: No Periwound Skin Texture Texture Color No Abnormalities Noted: Yes No Abnormalities Noted: No Erythema: No Moisture Hemosiderin Staining: Yes No Abnormalities Noted: No Dry / Scaly: Yes Temperature / Pain Maceration: Yes Temperature: No Abnormality Treatment Notes Wound #7 (Lower Leg) Wound Laterality: Left, Anterior Cleanser Soap and Water Discharge Instruction: May shower and wash wound with dial antibacterial soap and water prior to dressing change. Wound Cleanser Discharge Instruction: Cleanse the wound with wound cleanser prior to applying a clean dressing using gauze sponges, not tissue or cotton balls. Peri-Wound Care Ketoconazole Cream 2% Discharge Instruction: Apply Ketoconazole as directed Triamcinolone 15 (g) [name], [name] ([id number]) [id number] Page 7 of 7 Discharge  Instruction: Use triamcinolone 15 (g) as directed Zinc Oxide Ointment 30g tube Discharge Instruction: Apply Zinc Oxide to periwound with each dressing change Sween Lotion (Moisturizing lotion) Discharge Instruction: Apply moisturizing lotion as directed Topical Mupirocin Ointment Discharge Instruction: Apply Mupirocin (Bactroban) as instructed Primary Dressing Sorbalgon AG  Dressing, 4x4 (in/in) Discharge Instruction: Apply to wound bed as instructed Secondary Dressing Woven Gauze Sponge, Non-Sterile 4x4 in Discharge Instruction: Apply over primary dressing as directed. Zetuvit Plus 4x8 in Discharge Instruction: Apply over primary dressing as directed. Secured With Compression Wrap FourPress (4 layer compression wrap) Discharge Instruction: Apply four layer compression as directed. May also use Urgo K2 compression system as alternative. Netting #5 Compression Stockings Add-Ons Electronic Signature(s) Signed: 05/29/2022 3:59:59 PM By: Karie Schwalbe RN Entered By: Karie Schwalbe on 05/29/2022 10:06:19 -------------------------------------------------------------------------------- Vitals Details Patient Name: Date of Service: Erin Rosier NITA [name] 05/29/2022 9:30 A M Medical Record Number: [id number] Patient Account Number: [id number] Date of Birth/Sex: Treating RN: 1955-08-15 (67 y.o. [name] Primary Care Dutchess Crosland: [name] Other Clinician: Referring Kellan Raffield: Treating Melana Hingle/Extender: Jasmine Pang in Treatment: 18 Vital Signs Time Taken: 08:50 Temperature (F): 98 Height (in): 62 Pulse (bpm): 76 Weight (lbs): 222 Respiratory Rate (breaths/min): 18 Body Mass Index (BMI): 40.6 Blood Pressure (mmHg): 108/72 Reference Range: 80 - 120 mg / dl Electronic Signature(s) Signed: 05/29/2022 3:59:59 PM By: Karie Schwalbe RN Entered By: Karie Schwalbe on 05/29/2022 10:01:20

## 2022-05-29 NOTE — Progress Notes (Signed)
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 bed) Wound Treatment Wound #7 - Lower Leg Wound Laterality: Left, Anterior Cleanser: Soap and Water 1 x Per Week/30 Days Discharge Instructions: May shower and wash wound with dial antibacterial soap and water prior to dressing change. Cleanser: Wound Cleanser 1 x Per Week/30 Days Discharge Instructions: Cleanse the wound with wound cleanser prior to applying a clean dressing using gauze sponges, not tissue or cotton balls. Peri-Wound Care: Ketoconazole Cream 2% 1 x Per Week/30 Days Discharge Instructions: Apply Ketoconazole as directed Peri-Wound Care: Triamcinolone 15 (g) 1 x Per Week/30 Days Discharge Instructions: Use triamcinolone 15 (g) as directed Peri-Wound Care: Zinc Oxide Ointment 30g tube 1 x Per Week/30 Days Discharge Instructions: Apply Zinc Oxide to periwound with each dressing change Peri-Wound Care: Sween Lotion (Moisturizing lotion) 1 x Per Week/30 Days Discharge Instructions: Apply moisturizing lotion as directed Topical: Mupirocin Ointment 1 x Per Week/30 Days Discharge Instructions: Apply  Mupirocin (Bactroban) as instructed Prim Dressing: Sorbalgon AG Dressing, 4x4 (in/in) 1 x Per Week/30 Days ary Discharge Instructions: Apply to wound bed as instructed [name], [name] ([id number]) [id number] Page 6 of 13 Secondary Dressing: Woven Gauze Sponge, Non-Sterile 4x4 in 1 x Per Week/30 Days Discharge Instructions: Apply over primary dressing as directed. Secondary Dressing: Zetuvit Plus 4x8 in 1 x Per Week/30 Days Discharge Instructions: Apply over primary dressing as directed. Compression Wrap: FourPress (4 layer compression wrap) 1 x Per Week/30 Days Discharge Instructions: Apply four layer compression as directed. May also use Urgo K2 compression system as alternative. Compression Wrap: Netting #5 1 x Per Week/30 Days Electronic Signature(s) Signed: 05/29/2022 10:57:43 AM By: [name] MD FACS Entered By: [name] on 05/29/2022 10:50:38 -------------------------------------------------------------------------------- Problem List Details Patient Name: Date of Service: [name] 05/29/2022 9:30 A M Medical Record Number: [id number] Patient Account Number: [id number] Date of Birth/Sex: Treating RN: March 13, 1955 (67 y.o. 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 05/29/2022 9:30 A M Medical Record Number: [id number] Patient Account Number: [id number] Date of Birth/Sex: Treating RN: Oct 17, 1955 (67 y.o. F) Primary Care Provider: [name] Other Clinician: Referring Provider: Treating Provider/Extender: Jasmine Pang in Treatment: 18 Subjective Chief Complaint Information obtained from Patient 04/19/2021: The patient is here for ongoing follow-up regarding 2 left lower extremity wounds. 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 She tells [name] that she fell on the gravel driveway in July. She developed injuries on her distal lower leg which have not healed. She saw her primary physician on 11/15/2017 who noted her left shin injuries. Gave her antibiotics. At that point the wounds were almost circumferential however most were less than 1.5 cm. Weeping edema fluid was noted. She was referred here for evaluation. The patient has a history of chronic lower extremity edema. 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 She has had prior history with leg weeping fluid. She does not wear compression stockings. She is not doing anything specific to these wound areas. 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 She also has lymphedema. Put her in 3 layer compression and silver alginate last week. She has 3 small wounds with depth just lateral to the tibia. More problematically than this she has numerous shallow areas some of which are almost canal like in shape with tightly adherent painful debris. It would be very difficult and time- consuming to go through this and attempt to individually debride all these areas.. I changed her to collagen today to see if that would help with any of the surface debris on some of these wounds. Otherwise we will not be able to put this in compression we have to have someone change the dressing. The patient is not eligible for home health 12/25/17 on evaluation today patient actually appears to be doing rather well in regard to the ulcer on her lower extremity. Fortunately there does not appear to be evidence of infection at this time. She has been tolerating the dressing changes without complication. This includes the compression wrap. The only issue she had was that the wrap was initially placed over her bunion region which actually calls her some discomfort and pain. Other than that things seem to be going rather well. 01/01/2018 Seen today for follow-up and management of left lower extremity wound and lymphedema. T oday she presents with a new wound towards to the left lateral LE. Recently treated with a 7 day course of amoxicillin; reason for antibiotic dose is unknown at this time. Tolerating current treatment of collagen with 4- layer wraps. She obtained a venous reflux study on 12/25/17. Studies show on the right abnormal reflux times of the popliteal vein, great saphenous vein at the saphenofemoral junction at the proximal thigh,  great saphenous vein at the mid calf, and origin of the small saphenous vein.No superficial thrombosis. No deep vein thrombosis in the common femoral,  femoral,and popliteal veins. Left abnormal reflex times as well of the common femoral vein, popliteal vein, and a great saphenous vein at the saphenofemoral junction, In great saphenous vein at the mid thigh w/o thrombosis. Has any issues or concerns during visit today. Recommended follow-up to vascular specialist due to abnormalities from the venous reflux study. Denies fever, pain, chills, dizziness, nausea, or vomiting. 01/08/18 upon evaluation today the patient actually seems to be showing some signs of improvement in my opinion at this point in regard to the lower extremity ulcerated areas. She still has a lot of drainage but fortunately nothing that appears to be too significant currently. I have been very happy with the overall progress I see today compared to where things were during the last evaluation that I had with her. Nonetheless she has not had her appointment with the vein specialist as of yet in fact we were able to get this approved for her today and confirmed with them she will be seeing them on December 26. Nonetheless in general I do feel like the compression wraps is doing well for her. 01/17/18; quite a bit of improvement since last time I saw this patient she has a small open area remaining on the left lateral calf and even smaller area medially. She has lymphedema chronic stasis changes with distal skin fibrosis. She has an appointment with vascular surgery later this month 01/24/2018; the patient's medial leg has closed. Still a small open area on the left lateral leg. She states that the 4 layer compression we put on last week was too tight and she had to take it off over a few days ago. We have had resultant increase in her lymphedema in the dorsal foot and a proximal calf. Fortunately that does not seem to have resulted in any deterioration in her wounds The patient is going to need compression stockings. We have given her measurements to phone elastic therapy in [place].  She has vascular surgery consult on December 26 02/07/18; she's had continuous contraction on the left lateral leg wound which is now very small. Currently using silver alginate under 3 layer compression She saw Dr. Myra Gianotti of vascular surgery on 02/06/18. It was noted that she had a normal reflux times in the popliteal vein, great saphenous vein at the saphenofemoral junction, great saphenous vein at the proximal thigh great saphenous vein at the mid calf and origin of the small saphenous vein. It was noted that she had significant reflux in the left saphenous veins with diameter measurements in the 0.7-0.8 cm range. It was felt she would benefit from laser ablation to help minimize the risk of ulcer recurrence. It was recommended that she wear 20-30 thigh-high compression stockings and have follow-up in 4-6 weeks 02/17/2018; I thought this lady would be healed however her compression slipped down and she developed increasing swelling and the wound is actually larger. 1/13; we had deterioration last week after the patient's compression slipped down and she developed periwound swelling. I increased her compression before layers we have been using silver alginate we are a lot better again today. She has her compression stockings in waiting 1/23; the patient's wounds are totally healed today. She has her stockings. This was almost circumferential skin damage. She follows up with Dr. Myra Gianotti of vascular surgery next Monday. READMISSION 02/21/2021 This is a now 67 year old woman that  we had in clinic here discharging in January 2020 with wounds on her left calf chronic venous insufficiency. She was discharged with 30/40 stockings. It does not sound like she has worn stockings in about a year largely from not being able to get them on herself. In November she developed new blisters on her legs an area laterally is opened into a fairly sizable wound. She has weeping posteriorly as well. She has been  using Neosporin and Band-Aids. The patient did see Dr. Myra Gianotti in 2019 and 2020. He felt she might benefit from laser ablation in her left saphenous veins although because of the wound that healed I do not think he went through with it. She might benefit from seeing him again. Her ABI on the left is 1. 1/17; patient's wound on the posterior left calf is closed she has the 2 large areas last week. We put her in 4-layer compression for edema control is a lot better. Our intake nurse noted greenish drainage and odor. We have been using silver alginate 1/25; PCR culture I did have the substantial wound area on the left lateral lower leg showed staff aureus and group A strep. Low titers of coag negative staph which are probably skin contaminants. Resistance detected to tetracycline methicillin and macrolides. I gave her a starter kit of Nuzyra 150 mg x 3 for 2 days then 300 mg for a further 7 days. Marked odor considerable increase in surrounding erythema. We are using Iodoflex last week however I have changed to silver alginate with underlying Bactroban 2/1; she is completing her [place] tomorrow. The degree of erythema around the wounds looks a lot better. Odor has improved. I gave her silver alginate and Bactroban last week and changing her back to Iodoflex to continue with ongoing debridement 2/8; the periwound looks a lot better. Surface of the wound also looks somewhat better although there is still ongoing debridement to be done we have been using Iodoflex under compression. Primary dressing and silver alginate 2/15; left posterior calf. Some improvement in the surface of the wound but still very gritty we have been using Iodoflex under compression. 04/05/2021: Left lateral posterior calf. She continues to have significant amounts of drainage, some of which is probably comprised of the Iodoflex microbleeds. There is no odor to the drainage. The satellite lesion on the more posterior aspect of the  calf is epithelializing nicely and has contracted quite a bit. There is robust granulation tissue in the dominant wound with minimal adherent slough. 04/12/2021: The satellite lesion has nearly closed. She continues to have good granulation tissue with minimal slough of the larger primary wound. She continues [name], [name] ([id number]) [id number] Page 8 of 13 to have a fair amount of drainage, but I think this is less secondary to switching her dressing from Iodoflex to Prisma. 04/19/2021: The satellite lesion is almost completely epithelialized. The larger primary wound continues to contract with good granulation tissue and minimal slough. She is currently in [place] with compression. 04/26/2021: The satellite lesion has closed. The larger primary wound has contracted further and the granulation tissue is robust without being hypertrophic. Minimal slough is present. 05/03/2021: The satellite lesion remains closed. The larger primary wound has a bit of slough, but has also contracted further with good perimeter epithelialization. Good granulation tissue at the wound surface. There was some greenish drainage on the dressing when it was removed; it is not the blue-green typically associated with Pseudomonas aeruginosa, however. 05/10/2021: The primary wound continues to contract.  There is minimal slough. The granulation tissue at 9:00 is a bit hypertrophic. No significant drainage. No odor. 05/17/2021: The wound is a little bit smaller today with good granulation tissue. Minimal slough. No significant drainage or odor. 05/24/2021: The wound continues to contract and has a nice base of granulation tissue. Small amount of slough. No concern for infection. 05/31/2021: For some reason, the wound measured slightly larger today but overall it still appears to be in good condition with a nice base of granulation tissue and minimal slough. 06/07/2021: The wound is smaller today. Good  granulation tissue and minimal slough. 06/14/2021: The wound is unchanged in size. She continues to accumulate some slough. Good granulation tissue on the surface. 06/20/2021: The wound is smaller today. Minimal slough with good granulation tissue. 06/27/2021: The wound on her left lateral leg is smaller today with just a bit of slough and eschar accumulation. Unfortunately, she has opened a new superficial wound on her right lower extremity. She has 3+ pitting edema to the knees on that leg and she does not wear compression stockings. 07/04/2021: In addition to the superficial wound on her right lower extremity that she opened up last week, she has opened 3 additional sites on the same leg. Her compression wraps were clearly not in correct position when she came to clinic today as they were at about the mid calf level rather than to the tibial tuberosity. She says that they slipped earlier and she had to come back on Friday to have them redone. The wound on her left lateral leg is perhaps slightly larger with some slough accumulation. 07/11/2021: The superficial wounds on her right lower extremity are nearly closed. They just have a thin layer of eschar overlying them. The wound on her left lateral leg is a little bit shallower and has a bit of slough accumulation. 07/17/2021: The right medial lower extremity leg wounds have almost completely closed; one of them just has a tiny opening with a little bit of serous drainage. The left lateral leg wound is really unchanged. It seems to be stalled. 07/24/2021: The right leg wounds are completely closed. The left lateral leg wound is actually bigger today. It is a bit more tender. There is a minor accumulation of slough on the surface. 07/31/2021: The culture that I took last week was positive for MRSA. We added mupirocin under the silver alginate and I prescribed doxycycline. She has been tolerating this well. The wound is smaller today but does have slough  accumulation. No significant pain or drainage. 08/07/2021: She completed her course of doxycycline. The wound looks much better today. It is smaller and the periwound is less inflamed. She does have some slough accumulation on the surface. 08/14/2021: The wound continues to contract. There is slough on the wound surface, but the periwound is intact without inflammation or induration. 08/21/2021: The wound is down to just 2 small open sites. They both have a bit of slough accumulation. 08/28/2021: The wound is down to just 1 small open site with a little bit of slough and eschar accumulation. 09/04/2021: The wound continues to contract but remains open. It is clean without any slough. 09/12/2021: No real change in the overall wound dimensions, but it is flush with the surrounding skin. There is a little bit of slough accumulation. Edema control is good. 09/22/2021: The wound is smaller and even more superficial. Minimal slough accumulation. Good control of edema. 09/29/2021: The wound continues to contract. She reports that it was a little  bit "twingey" during the week. It is a little bit dry on inspection today. Light accumulation of slough. Good edema control. 10/06/2021: The wound is about the same size today. There is a little slough on the surface. Edema control is good. 10/13/2021: The wound is about the same size but more superficial. A little bit of slough on the surface. 10/23/2021: The wound is slightly smaller and continues to fill in. Minimal slough on the wound surface. 10/30/2021: The wound continues to contract but has not yet closed. Light slough and eschar present. 11/06/2021: The wound persists, but seems a little bit more epithelialized. There is still slough and eschar accumulation. 11/14/2021: The wound continues to contract but persists. Light eschar around the wound. 11/22/2021: The wound is down to just a pinhole opening. There is eschar overlying the surface. Edema control is  excellent. 11/29/2021: Her wound is closed. READMISSION 01/22/2022 This is a patient to be discharged in October. She has severe chronic venous insufficiency at that time she had a wound on her left lower leg posteriorly also the right leg at some point. These closed. We discharged her in 30/40 mm stockings from elastic therapy which she has been wearing religiously. She tells [name] that she was traumatized by a dolly while shopping at [employer] about 2 to 3 weeks ago. She has been left with a left anterior lower leg wound. She comes in in another pair of stockings other than her 3040s. She does not have an arterial issue with her last ABI in the left at 1.2 12/18; this is a patient who has chronic venous insufficiency and recurrent venous insufficiency ulcers. She has a area on her left anterior lower leg and we [name], [name] ([id number]) [id number] Page 9 of 13 readmitted her to the clinic last week. We use silver alginate and 4-layer compression. ABI was 1.2 She brought in her stocking which was a 20/30 below-knee stocking from elastic therapy. She may require a 30/40 mm equivalent stockings after this wound heals. 02/06/2022: The intake nurse reported an odor coming from the wound when she first unwrapped it but this abated after the leg was washed. There is thick layer of slough on the surface. 02/13/2022: The wound is cleaner today. It measured larger, but on visual inspection, I think some crust of Iodoflex was included in the wound measurements; the actual wound itself looks about the same to slightly smaller. Edema control is good. 02/20/2022: The wound is cleaner again today. It is also measuring a little bit smaller, but there has been some moisture related periwound breakdown. Edema control is good. 02/27/2022: The intake nurse reported that the wound measures larger, but some of this ended up being crusted on Iodoflex. There is still some periwound moisture. Still  with thick slough accumulation. Edema control is good. 03/06/2022: The wound is cleaner and more superficial, but did measure larger because of the inclusion of a satellite area. Still with some slough accumulation but less so than on prior visits. 03/13/2022: The wound measured a little bit smaller today. There is slough and eschar accumulation, as per usual. 03/20/2022: Her wound is larger and deeper today. The entire surface appears nonviable. There is more periwound erythema and threatened tissue breakdown. 03/27/2022: The culture that I took last week was positive for MRSA. We had empirically applied topical mupirocin to her wound. Today the wound is smaller and cleaner and less painful. There is still a layer of slough on the surface. 04/03/2022: The wound was measured slightly  larger today, but appears roughly the same to my eye. There is a layer of slough on the surface. Underneath, the tissue is quite fibrotic. 04/10/2022: The wound is slightly smaller today. There is slough and eschar accumulation. For some reason, her wrap got changed from 4 layers to 3 layers and her edema control is not as good, as a result. 04/17/2022: The return to true 4-layer compression has improved the wound considerably. It is much smaller and cleaner today. Edema control is excellent. 04/24/2022: Her wound continues to improve. It is smaller with just a bit of slough on the surface. Edema control is excellent. 05/08/2022: Her wound is smaller again today. There is still slough accumulation on the surface. Edema control remains excellent. 05/15/2022: The wound is substantially smaller this week. There is slough on the surface with excellent edema control. 05/22/2022: Her wound is smaller again this week. Minimal slough on the surface. Good edema control. 05/29/2022: Once again, her wound is smaller. There is a bit of slough accumulation. Edema control remains excellent. Patient History Information obtained from Patient. Family  History Heart Disease - Mother,Father, Hypertension - Mother,Father, Kidney Disease - Mother, Thyroid Problems - Mother, No family history of Cancer, Diabetes, Hereditary Spherocytosis, Lung Disease, Seizures, Stroke, Tuberculosis. Social History Never smoker, Marital Status - Single, Alcohol Use - Never, Drug Use - No History, Caffeine Use - Daily - coffee. Medical History Eyes Patient has history of Cataracts Hematologic/Lymphatic Patient has history of Lymphedema Cardiovascular Patient has history of Hypertension, Peripheral Venous Disease Integumentary (Skin) Denies history of History of Burn Musculoskeletal Patient has history of Osteoarthritis Hospitalization/Surgery History - colostomy reversal. 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 Medical A Surgical History Notes nd Constitutional Symptoms (General Health) morbid obesity Cardiovascular schamberg disease, hyperlipidemia Gastrointestinal diverticulitis , h/o obstruction due to diverticulitis , colostomy and colostomy reversal Endocrine hypothyroidism [address] [name], [name] ([id number]) [id number] Page 10 of 13 Constitutional no acute distress. Vitals Time Taken: 8:50 AM, Height: 62 in, Weight: 222 lbs, BMI: 40.6, Temperature: 98 F, Pulse: 76 bpm, Respiratory Rate: 18 breaths/min, Blood Pressure: 108/72 mmHg. Respiratory Normal work of breathing on room air. General Notes: 05/29/2022: Once again, her wound is smaller. There is a bit of slough accumulation. Edema control remains excellent. Integumentary (Hair, Skin) Wound #7 status is Open. Original cause of wound was Blister. The date acquired was: 01/22/2022. The wound has been in treatment 18 weeks. The wound is located on the Left,Anterior Lower Leg. The wound measures 0.3cm length x 0.3cm width x 0.1cm depth; 0.071cm^2 area and 0.007cm^3 volume. There is Fat Layer (Subcutaneous Tissue) exposed. There is no tunneling or undermining noted.  There is a medium amount of serosanguineous drainage noted. The wound margin is distinct with the outline attached to the wound base. There is large (67-100%) pink granulation within the wound bed. There is a small (1-33%) amount of necrotic tissue within the wound bed including Adherent Slough. The periwound skin appearance had no abnormalities noted for texture. The periwound skin appearance exhibited: Dry/Scaly, Maceration, Hemosiderin Staining. The periwound skin appearance did not exhibit: Erythema. Periwound temperature was noted as No Abnormality. 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 There was a Selective/Open Wound Non-Viable Tissue Debridement with a total area of 0.09 sq cm performed by [name],  MD. With the following instrument(s): Curette to remove Non-Viable tissue/material. Material removed includes Eschar and Slough and after achieving pain control using Lidocaine 4% T opical Solution. No specimens were taken. A time out was conducted at 10:15, prior to the start of the procedure. A Minimum amount of bleeding was controlled with Pressure. The procedure was tolerated well with a pain level of 0 throughout and a pain level of 0 following the procedure. Post Debridement Measurements: 0.3cm length x 0.3cm width x 0.1cm depth; 0.007cm^3 volume. Character of Wound/Ulcer Post Debridement is improved. Post procedure Diagnosis Wound #7: Same as Pre-Procedure General Notes: Scribed for Dr. Lady Gary by J.[name]. Pre-procedure diagnosis of Wound #7 is a Lymphedema located on the Left,Anterior Lower Leg . There was a Four Layer Compression Therapy Procedure by Karie Schwalbe, RN. Post procedure Diagnosis  Wound #7: Same as Pre-Procedure Notes: Or URGO K2 compression wraps. Plan Follow-up Appointments: Return Appointment in 1 week. - Dr. Lady Gary Room 3 Anesthetic: (In clinic) Topical Lidocaine 5% applied to wound bed Bathing/ Shower/ Hygiene: May shower with protection but do not get wound dressing(s) wet. Protect dressing(s) with water repellant cover (for example, large plastic bag) or a cast cover and may then take shower. - Please do not get the Left lower leg Compression wraps wet. Edema Control - Lymphedema / SCD / Other: Avoid standing for long periods of time. Patient to wear own compression stockings every day. Exercise regularly Moisturize legs daily. Off-Loading: Other: - Elevate feet on a pillow (behind the calf while in bed) WOUND #7: - Lower Leg Wound Laterality: Left, Anterior Cleanser: Soap and Water 1 x Per Week/30 Days Discharge Instructions: May shower and wash wound with dial antibacterial soap and water prior to dressing change. Cleanser: Wound Cleanser 1 x Per Week/30 Days Discharge Instructions: Cleanse the wound with wound cleanser prior to applying a clean dressing using gauze sponges, not tissue or cotton balls. Peri-Wound Care: Ketoconazole Cream 2% 1 x Per Week/30 Days Discharge Instructions: Apply Ketoconazole as directed Peri-Wound Care: Triamcinolone 15 (g) 1 x Per Week/30 Days Discharge Instructions: Use triamcinolone 15 (g) as directed Peri-Wound Care: Zinc Oxide Ointment 30g tube 1 x Per Week/30 Days Discharge Instructions: Apply Zinc Oxide to periwound with each dressing change [name], [name] ([id number]) [phone].pdf Page 11 of 13 Peri-Wound Care: Sween Lotion (Moisturizing lotion) 1 x Per Week/30 Days Discharge Instructions: Apply moisturizing lotion as directed Topical: Mupirocin Ointment 1 x Per Week/30 Days Discharge Instructions: Apply Mupirocin (Bactroban) as instructed Prim Dressing: Sorbalgon AG Dressing, 4x4  (in/in) 1 x Per Week/30 Days ary Discharge Instructions: Apply to wound bed as instructed Secondary Dressing: Woven Gauze Sponge, Non-Sterile 4x4 in 1 x Per Week/30 Days Discharge Instructions: Apply over primary dressing as directed. Secondary Dressing: Zetuvit Plus 4x8 in 1 x Per Week/30 Days Discharge Instructions: Apply over primary dressing as directed. Com pression Wrap: FourPress (4 layer compression wrap) 1 x Per Week/30 Days Discharge Instructions: Apply four layer compression as directed. May also use Urgo K2 compression system as alternative. Com pression Wrap: Netting #5 1 x Per Week/30 Days 05/29/2022: Once again, her wound is smaller. There is a bit of slough accumulation. Edema control remains excellent. I used a curette to debride the slough from the wound surface. We will continue topical gentamicin, silver alginate, and 4-layer compression. Follow-up in 1 week. Electronic Signature(s) Signed: 05/29/2022 10:51:17 AM By: [name] MD FACS Entered By: [name] on 05/29/2022 10:51:16 -------------------------------------------------------------------------------- HxROS Details Patient Name: Date of Service:  [name], Erin NITA [name] 05/29/2022 9:30 A M Medical Record Number: [id number] Patient Account Number: [id number] Date of Birth/Sex: Treating RN: [birth year] (67 y.o. F) Primary Care Provider: [name] Other Clinician: Referring Provider: Treating Provider/Extender: Jasmine Pang in Treatment: 18 Information Obtained From Patient Constitutional Symptoms (General Health) Medical History: Past Medical History Notes: morbid obesity Eyes Medical History: Positive for: Cataracts Hematologic/Lymphatic Medical History: Positive for: Lymphedema Cardiovascular Medical History: Positive for: Hypertension; Peripheral Venous Disease Past Medical History Notes: schamberg disease, hyperlipidemia Gastrointestinal Medical History: Past  Medical History Notes: diverticulitis , h/o obstruction due to diverticulitis , colostomy and colostomy reversal Endocrine Medical History: Past Medical History Notes: hypothyroidism Integumentary ([address] [place], [place] [name] ([id number]) [id number] Page 12 of 13 Medical History: Negative for: History of Burn Musculoskeletal Medical History: Positive for: Osteoarthritis HBO Extended History Items Eyes: Cataracts Immunizations Pneumococcal Vaccine: Received Pneumococcal Vaccination: Yes Received Pneumococcal Vaccination On or After 60th Birthday: Yes Implantable Devices None Hospitalization / Surgery History Type of Hospitalization/Surgery colostomy reversal 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 Family and Social History Cancer: No; Diabetes: No; Heart Disease: Yes - Mother,Father; Hereditary Spherocytosis: No; Hypertension: Yes - Mother,Father; Kidney Disease: Yes - Mother; Lung Disease: No; Seizures: No; Stroke: No; Thyroid Problems: Yes - Mother; Tuberculosis: No; Never smoker; Marital Status - Single; Alcohol Use: Never; Drug Use: No History; Caffeine Use: Daily - coffee; Financial Concerns: No; Food, Clothing or Shelter Needs: No; Support System Lacking: No; Transportation Concerns: No Electronic Signature(s) Signed: 05/29/2022 10:57:43 AM By: [name] MD FACS Entered By: [name] on 05/29/2022 10:49:34 -------------------------------------------------------------------------------- SuperBill Details Patient Name: Date of Service: [name] 05/29/2022 Medical Record Number: [id number] Patient Account Number: [id number] Date of Birth/Sex: Treating RN: 02-04-1956 (67 y.o. 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 with other specif Modifier: ied severity Quantity: 1 Physician Procedures : CPT4 Code Description Modifier [id number] - WC PHYS LEVEL 4 - EST PT 25 ICD-10 Diagnosis Description L97.928 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

## 2022-05-31 ENCOUNTER — Other Ambulatory Visit (HOSPITAL_COMMUNITY): Payer: Self-pay

## 2022-06-04 ENCOUNTER — Other Ambulatory Visit (HOSPITAL_COMMUNITY): Payer: Self-pay

## 2022-06-05 ENCOUNTER — Encounter (HOSPITAL_BASED_OUTPATIENT_CLINIC_OR_DEPARTMENT_OTHER): Payer: Commercial Managed Care - PPO | Admitting: General Surgery

## 2022-06-05 DIAGNOSIS — L97928 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: Secondary | ICD-10-CM | POA: Diagnosis not present

## 2022-06-05 DIAGNOSIS — I87312 Chronic venous hypertension (idiopathic) with ulcer of left lower extremity: Secondary | ICD-10-CM | POA: Diagnosis not present

## 2022-06-05 DIAGNOSIS — I890 Lymphedema, not elsewhere classified: Secondary | ICD-10-CM | POA: Diagnosis not present

## 2022-06-06 NOTE — Progress Notes (Addendum)
TAFFANY, HEISER ([id number]) [id number] Page 1 of 13 Visit Report for 06/05/2022 Chief Complaint Document Details Patient Name: Date of Service: [name], [name] 06/05/2022 9:30 A M Medical Record Number: [id number] Patient Account Number: [id number] Date of Birth/Sex: Treating RN: 1955/02/14 (67 y.o. F) Primary Care Provider: [name] Other Clinician: Referring Provider: Treating Provider/Extender: Jasmine Pang in Treatment: 19 Information Obtained from: Patient Chief Complaint 04/19/2021: The patient is here for ongoing follow-up regarding 2 left lower extremity wounds. 01/22/2022; patient returns to clinic with a wound on the left anterior lower leg secondary to trauma Electronic Signature(s) Signed: 06/05/2022 10:42:54 AM By: [name] MD FACS Entered By: [name] on 06/05/2022 10:42:54 -------------------------------------------------------------------------------- Debridement Details Patient Name: Date of Service: Erin Rosier NITA [name] 06/05/2022 9:30 A M Medical Record Number: [id number] Patient Account Number: [id number] Date of Birth/Sex: Treating RN: [birth year] (67 y.o. [name] Primary Care Provider: [name] Other Clinician: Referring Provider: Treating Provider/Extender: Jasmine Pang in Treatment: 19 Debridement Performed for Assessment: Wound #7 Left,Anterior Lower Leg Performed By: Physician [name], MD Debridement Type: Debridement Level of Consciousness (Pre-procedure): Awake and Alert Pre-procedure Verification/Time Out Yes - 10:15 Taken: Start Time: 10:15 Pain Control: Lidocaine 5% topical ointment Percent of Wound Bed Debrided: 100% T Area Debrided (cm): otal 0.02 Tissue and other material debrided: Non-Viable, Eschar, Slough, Slough Level: Non-Viable Tissue Debridement Description: Selective/Open Wound Instrument: Curette Bleeding:  Minimum Hemostasis Achieved: Pressure End Time: 10:16 Procedural Pain: 0 Post Procedural Pain: 0 Response to Treatment: Procedure was tolerated well Level of Consciousness (Post- Awake and Alert procedure): Post Debridement Measurements of Total Wound Length: (cm) 0.2 Width: (cm) 0.1 Depth: (cm) 0.1 Volume: (cm) 0.002 Character of Wound/Ulcer Post Debridement: Improved Post Procedure Diagnosis Same as Pre-procedure Notes Scribed for Dr. Lady Gary by J.[name] Electronic Signature(s) [name], [name] ([id number]) [id number] Page 2 of 13 Signed: 06/05/2022 11:24:22 AM By: [name] MD FACS Signed: 06/05/2022 4:13:56 PM By: Karie Schwalbe RN Entered By: Karie Schwalbe on 06/05/2022 10:21:17 -------------------------------------------------------------------------------- HPI Details Patient Name: Date of Service: Erin Philips [name] 06/05/2022 9:30 A M Medical Record Number: [id number] Patient Account Number: [id number] Date of Birth/Sex: Treating RN: 1955-12-26 (67 y.o. 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 She tells [name] that she fell on the gravel driveway in July. She developed injuries on her distal lower leg which have not healed. She saw her primary physician on 11/15/2017 who noted her left shin injuries. Gave her antibiotics. At that point the wounds were almost circumferential however most were less than 1.5 cm. Weeping edema fluid was noted. She was referred here for evaluation. The patient has a history of chronic lower extremity edema. 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 She has had prior history with leg weeping fluid. She does not wear compression stockings. She is not doing anything specific to these wound areas. 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 She also  has lymphedema. Put her in 3 layer compression and silver alginate last week. She has 3 small wounds with depth just lateral to the tibia. More problematically than this she has numerous shallow areas some of which are almost canal like in shape with tightly adherent painful debris. It would be very difficult and time- consuming to go through this and attempt to individually debride all these areas.. I changed her to collagen today to see if that would help with any of the surface debris on some of these wounds. Otherwise we will not be able to put this in compression we have to have someone change the dressing. The patient is not eligible for home health 12/25/17 on evaluation today patient actually appears to be doing rather well in regard to the ulcer on her lower extremity. Fortunately there does not appear to be evidence of infection at this time. She has been tolerating the dressing changes without complication. This includes the compression wrap. The only issue she had was that the wrap was initially placed over her bunion region which actually calls her some discomfort and pain. Other than that things seem to be going rather well. 01/01/2018 Seen today for follow-up and management of left lower extremity wound and lymphedema. T oday she presents with a new wound towards to the left lateral LE. Recently treated with a 7 day course of amoxicillin; reason for antibiotic dose is unknown at this time. Tolerating  current treatment of collagen with 4- layer wraps. She obtained a venous reflux study on 12/25/17. Studies show on the right abnormal reflux times of the popliteal vein, great saphenous vein at the saphenofemoral junction at the proximal thigh, great saphenous vein at the mid calf, and origin of the small saphenous vein.No superficial thrombosis. No deep vein thrombosis in the common femoral, femoral,and popliteal veins. Left abnormal reflex times as well of the common femoral vein, popliteal vein, and a great saphenous vein at the saphenofemoral junction, In great saphenous vein at the mid thigh w/o thrombosis. Has any issues or concerns during visit today. Recommended follow-up to vascular specialist due to abnormalities from the venous reflux study. Denies fever, pain, chills, dizziness, nausea, or vomiting. 01/08/18 upon evaluation today the patient actually seems to be showing some signs of improvement in my opinion at this point in regard to the lower extremity ulcerated areas. She still has a lot of drainage but fortunately nothing that appears to be too significant currently. I have been very happy with the overall progress I see today compared to where things were during the last evaluation that I had with her. Nonetheless she has not had her appointment with the vein specialist as of yet in fact we were able to get this approved for her today and confirmed with them she will be seeing them on December 26. Nonetheless in general I do feel like the compression wraps is doing well for her. 01/17/18; quite a bit of improvement since last time I saw this patient she has a small open area remaining on the left lateral calf and even smaller area medially. She has lymphedema chronic stasis changes with distal skin fibrosis. She has an appointment with vascular surgery later this month 01/24/2018; the patient's medial leg has closed. Still a small open area on the left lateral leg. She states that the  4 layer compression we put on last week was too tight and she had to take it off over a few days ago. We have had resultant increase  in her lymphedema in the dorsal foot and a proximal calf. Fortunately that does not seem to have resulted in any deterioration in her wounds The patient is going to need compression stockings. We have given her measurements to phone elastic therapy in [place]. She has vascular surgery consult on December 26 02/07/18; she's had continuous contraction on the left lateral leg wound which is now very small. Currently using silver alginate under 3 layer compression She saw Dr. Myra Gianotti of vascular surgery on 02/06/18. It was noted that she had a normal reflux times in the popliteal vein, great saphenous vein at the saphenofemoral junction, great saphenous vein at the proximal thigh great saphenous vein at the mid calf and origin of the small saphenous vein. It was noted that she had significant reflux in the left saphenous veins with diameter measurements in the 0.7-0.8 cm range. It was felt she would benefit from laser ablation to help minimize the risk of ulcer recurrence. It was recommended that she wear 20-30 thigh-high compression stockings and have follow-up in 4-6 weeks 02/17/2018; I thought this lady would be healed however her compression slipped down and she developed increasing swelling and the wound is actually larger. 1/13; we had deterioration last week after the patient's compression slipped down and she developed periwound swelling. I increased her compression before layers we have been using silver alginate we are a lot better again today. She has her compression stockings in waiting 1/23; the patient's wounds are totally healed today. She has her stockings. This was almost circumferential skin damage. She follows up with Dr. Myra Gianotti of vascular surgery next Monday. READMISSION 02/21/2021 This is a now 67 year old woman that we had in clinic here discharging  in January 2020 with wounds on her left calf chronic venous insufficiency. She was discharged with 30/40 stockings. It does not sound like she has worn stockings in about a year largely from not being able to get them on herself. In November she developed new blisters on her legs an area laterally is opened into a fairly sizable wound. She has weeping posteriorly as well. She has been using Neosporin and Band-Aids. [name], [name] ([id number]) [id number] Page 3 of 13 The patient did see Dr. Myra Gianotti in 2019 and 2020. He felt she might benefit from laser ablation in her left saphenous veins although because of the wound that healed I do not think he went through with it. She might benefit from seeing him again. Her ABI on the left is 1. 1/17; patient's wound on the posterior left calf is closed she has the 2 large areas last week. We put her in 4-layer compression for edema control is a lot better. Our intake nurse noted greenish drainage and odor. We have been using silver alginate 1/25; PCR culture I did have the substantial wound area on the left lateral lower leg showed staff aureus and group A strep. Low titers of coag negative staph which are probably skin contaminants. Resistance detected to tetracycline methicillin and macrolides. I gave her a starter kit of Nuzyra 150 mg x 3 for 2 days then 300 mg for a further 7 days. Marked odor considerable increase in surrounding erythema. We are using Iodoflex last week however I have changed to silver alginate with underlying Bactroban 2/1; she is completing her [place] tomorrow. The degree of erythema around the wounds looks a lot better. Odor has improved. I gave her silver alginate and Bactroban last week and changing her back to Iodoflex to continue with ongoing  debridement 2/8; the periwound looks a lot better. Surface of the wound also looks somewhat better although there is still ongoing debridement to be done we have  been using Iodoflex under compression. Primary dressing and silver alginate 2/15; left posterior calf. Some improvement in the surface of the wound but still very gritty we have been using Iodoflex under compression. 04/05/2021: Left lateral posterior calf. She continues to have significant amounts of drainage, some of which is probably comprised of the Iodoflex microbleeds. There is no odor to the drainage. The satellite lesion on the more posterior aspect of the calf is epithelializing nicely and has contracted quite a bit. There is robust granulation tissue in the dominant wound with minimal adherent slough. 04/12/2021: The satellite lesion has nearly closed. She continues to have good granulation tissue with minimal slough of the larger primary wound. She continues to have a fair amount of drainage, but I think this is less secondary to switching her dressing from Iodoflex to Prisma. 04/19/2021: The satellite lesion is almost completely epithelialized. The larger primary wound continues to contract with good granulation tissue and minimal slough. She is currently in [place] with compression. 04/26/2021: The satellite lesion has closed. The larger primary wound has contracted further and the granulation tissue is robust without being hypertrophic. Minimal slough is present. 05/03/2021: The satellite lesion remains closed. The larger primary wound has a bit of slough, but has also contracted further with good perimeter epithelialization. Good granulation tissue at the wound surface. There was some greenish drainage on the dressing when it was removed; it is not the blue-green typically associated with Pseudomonas aeruginosa, however. 05/10/2021: The primary wound continues to contract. There is minimal slough. The granulation tissue at 9:00 is a bit hypertrophic. No significant drainage. No odor. 05/17/2021: The wound is a little bit smaller today with good granulation tissue. Minimal slough. No significant  drainage or odor. 05/24/2021: The wound continues to contract and has a nice base of granulation tissue. Small amount of slough. No concern for infection. 05/31/2021: For some reason, the wound measured slightly larger today but overall it still appears to be in good condition with a nice base of granulation tissue and minimal slough. 06/07/2021: The wound is smaller today. Good granulation tissue and minimal slough. 06/14/2021: The wound is unchanged in size. She continues to accumulate some slough. Good granulation tissue on the surface. 06/20/2021: The wound is smaller today. Minimal slough with good granulation tissue. 06/27/2021: The wound on her left lateral leg is smaller today with just a bit of slough and eschar accumulation. Unfortunately, she has opened a new superficial wound on her right lower extremity. She has 3+ pitting edema to the knees on that leg and she does not wear compression stockings. 07/04/2021: In addition to the superficial wound on her right lower extremity that she opened up last week, she has opened 3 additional sites on the same leg. Her compression wraps were clearly not in correct position when she came to clinic today as they were at about the mid calf level rather than to the tibial tuberosity. She says that they slipped earlier and she had to come back on Friday to have them redone. The wound on her left lateral leg is perhaps slightly larger with some slough accumulation. 07/11/2021: The superficial wounds on her right lower extremity are nearly closed. They just have a thin layer of eschar overlying them. The wound on her left lateral leg is a little bit shallower and has a bit  of slough accumulation. 07/17/2021: The right medial lower extremity leg wounds have almost completely closed; one of them just has a tiny opening with a little bit of serous drainage. The left lateral leg wound is really unchanged. It seems to be stalled. 07/24/2021: The right leg wounds are  completely closed. The left lateral leg wound is actually bigger today. It is a bit more tender. There is a minor accumulation of slough on the surface. 07/31/2021: The culture that I took last week was positive for MRSA. We added mupirocin under the silver alginate and I prescribed doxycycline. She has been tolerating this well. The wound is smaller today but does have slough accumulation. No significant pain or drainage. 08/07/2021: She completed her course of doxycycline. The wound looks much better today. It is smaller and the periwound is less inflamed. She does have some slough accumulation on the surface. 08/14/2021: The wound continues to contract. There is slough on the wound surface, but the periwound is intact without inflammation or induration. 08/21/2021: The wound is down to just 2 small open sites. They both have a bit of slough accumulation. 08/28/2021: The wound is down to just 1 small open site with a little bit of slough and eschar accumulation. 09/04/2021: The wound continues to contract but remains open. It is clean without any slough. 09/12/2021: No real change in the overall wound dimensions, but it is flush with the surrounding skin. There is a little bit of slough accumulation. Edema control is good. 09/22/2021: The wound is smaller and even more superficial. Minimal slough accumulation. Good control of edema. 09/29/2021: The wound continues to contract. She reports that it was a little bit "twingey" during the week. It is a little bit dry on inspection today. Light accumulation of slough. Good edema control. [name], [name] ([id number]) [id number] Page 4 of 13 10/06/2021: The wound is about the same size today. There is a little slough on the surface. Edema control is good. 10/13/2021: The wound is about the same size but more superficial. A little bit of slough on the surface. 10/23/2021: The wound is slightly smaller and continues to fill in. Minimal  slough on the wound surface. 10/30/2021: The wound continues to contract but has not yet closed. Light slough and eschar present. 11/06/2021: The wound persists, but seems a little bit more epithelialized. There is still slough and eschar accumulation. 11/14/2021: The wound continues to contract but persists. Light eschar around the wound. 11/22/2021: The wound is down to just a pinhole opening. There is eschar overlying the surface. Edema control is excellent. 11/29/2021: Her wound is closed. READMISSION 01/22/2022 This is a patient to be discharged in October. She has severe chronic venous insufficiency at that time she had a wound on her left lower leg posteriorly also the right leg at some point. These closed. We discharged her in 30/40 mm stockings from elastic therapy which she has been wearing religiously. She tells [name] that she was traumatized by a dolly while shopping at [employer] about 2 to 3 weeks ago. She has been left with a left anterior lower leg wound. She comes in in another pair of stockings other than her 3040s. She does not have an arterial issue with her last ABI in the left at 1.2 12/18; this is a patient who has chronic venous insufficiency and recurrent venous insufficiency ulcers. She has a area on her left anterior lower leg and we readmitted her to the clinic last week. We use silver alginate and 4-layer  compression. ABI was 1.2 She brought in her stocking which was a 20/30 below-knee stocking from elastic therapy. She may require a 30/40 mm equivalent stockings after this wound heals. 02/06/2022: The intake nurse reported an odor coming from the wound when she first unwrapped it but this abated after the leg was washed. There is thick layer of slough on the surface. 02/13/2022: The wound is cleaner today. It measured larger, but on visual inspection, I think some crust of Iodoflex was included in the wound measurements; the actual wound itself looks about the same to slightly  smaller. Edema control is good. 02/20/2022: The wound is cleaner again today. It is also measuring a little bit smaller, but there has been some moisture related periwound breakdown. Edema control is good. 02/27/2022: The intake nurse reported that the wound measures larger, but some of this ended up being crusted on Iodoflex. There is still some periwound moisture. Still with thick slough accumulation. Edema control is good. 03/06/2022: The wound is cleaner and more superficial, but did measure larger because of the inclusion of a satellite area. Still with some slough accumulation but less so than on prior visits. 03/13/2022: The wound measured a little bit smaller today. There is slough and eschar accumulation, as per usual. 03/20/2022: Her wound is larger and deeper today. The entire surface appears nonviable. There is more periwound erythema and threatened tissue breakdown. 03/27/2022: The culture that I took last week was positive for MRSA. We had empirically applied topical mupirocin to her wound. Today the wound is smaller and cleaner and less painful. There is still a layer of slough on the surface. 04/03/2022: The wound was measured slightly larger today, but appears roughly the same to my eye. There is a layer of slough on the surface. Underneath, the tissue is quite fibrotic. 04/10/2022: The wound is slightly smaller today. There is slough and eschar accumulation. For some reason, her wrap got changed from 4 layers to 3 layers and her edema control is not as good, as a result. 04/17/2022: The return to true 4-layer compression has improved the wound considerably. It is much smaller and cleaner today. Edema control is excellent. 04/24/2022: Her wound continues to improve. It is smaller with just a bit of slough on the surface. Edema control is excellent. 05/08/2022: Her wound is smaller again today. There is still slough accumulation on the surface. Edema control remains excellent. 05/15/2022: The wound  is substantially smaller this week. There is slough on the surface with excellent edema control. 05/22/2022: Her wound is smaller again this week. Minimal slough on the surface. Good edema control. 05/29/2022: Once again, her wound is smaller. There is a bit of slough accumulation. Edema control remains excellent. 06/05/2022: Her wound is smaller again by about half. There is some slough and eschar buildup. Edema control is consistently excellent. Electronic Signature(s) Signed: 06/05/2022 10:43:49 AM By: [name] MD FACS Entered By: [name] on 06/05/2022 10:43:49 -------------------------------------------------------------------------------- Physical Exam Details Patient Name: Date of Service: Erin Rosier NITA [name] 06/05/2022 9:30 A M Medical Record Number: [id number] Patient Account Number: [id number] Date of Birth/Sex: Treating RN: [birth year] (67 y.o. F) Primary Care Provider: [name] Other Clinician: Referring Provider: Treating Provider/Extender: Jasmine Pang in Treatment: [address], [place] [name] ([id number]) [id number] Page 5 of 13 Constitutional . . . . no acute distress. Respiratory Normal work of breathing on room air. Notes 06/05/2022: Her wound is smaller again by about half. There is some slough and eschar buildup. Edema  control is consistently excellent. Electronic Signature(s) Signed: 06/05/2022 10:44:25 AM By: [name] MD FACS Entered By: [name] on 06/05/2022 10:44:24 -------------------------------------------------------------------------------- Physician Orders Details Patient Name: Date of Service: Erin Rosier NITA [name] 06/05/2022 9:30 A M Medical Record Number: [id number] Patient Account Number: [id number] Date of Birth/Sex: Treating RN: [birth year] (67 y.o. 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 - Dr. Lady Gary Room 3 Return A Other: - Monday May 6th at 9:45 am Anesthetic (In clinic) Topical Lidocaine 5% applied to wound bed Bathing/ Shower/ Hygiene May shower with protection but do not get wound dressing(s) wet. Protect dressing(s) with water repellant cover (for example, large plastic bag) or a cast cover and may then take shower. - Please do not get the Left lower leg Compression wraps wet. Edema Control - Lymphedema / SCD / Other Avoid standing for long periods of time. Patient to wear own compression stockings every day. Exercise regularly Moisturize legs daily. Off-Loading Other: - Elevate feet on a pillow (behind the calf while in bed) Wound Treatment Wound #7 - Lower Leg Wound Laterality: Left, Anterior Cleanser: Soap and Water 1 x Per Week/30 Days Discharge Instructions: May shower and wash wound with dial antibacterial soap and water prior to dressing change. Cleanser: Wound Cleanser 1 x Per Week/30 Days Discharge Instructions: Cleanse the wound with wound cleanser prior to applying a clean dressing using gauze sponges, not tissue or cotton balls. Peri-Wound Care: Ketoconazole Cream 2% 1 x Per Week/30 Days Discharge Instructions: Apply Ketoconazole as directed Peri-Wound Care: Triamcinolone 15 (g) 1 x Per Week/30 Days Discharge Instructions: Use triamcinolone 15 (g) as directed Peri-Wound Care: Zinc Oxide Ointment 30g tube 1 x Per Week/30 Days Discharge Instructions: Apply Zinc Oxide to periwound with each dressing change Peri-Wound Care: Sween Lotion (Moisturizing lotion) 1 x Per Week/30 Days Discharge Instructions:  Apply moisturizing lotion as directed Topical: Mupirocin Ointment 1 x Per Week/30 Days Discharge Instructions: Apply Mupirocin (Bactroban) as instructed [name], [name] ([id number]) [id number] Page 6 of 13 Prim Dressing: [place] [employer], 4x4 (in/in) 1 x Per Week/30 Days ary Discharge Instructions: Apply to wound bed as instructed Secondary Dressing: Woven Gauze Sponge, Non-Sterile 4x4 in 1 x Per Week/30 Days Discharge Instructions: Apply over primary dressing as directed. Secondary Dressing: Zetuvit Plus 4x8 in 1 x Per Week/30 Days Discharge Instructions: Apply over primary dressing as directed. Compression Wrap: FourPress (4 layer compression wrap) 1 x Per Week/30 Days Discharge Instructions: Apply four layer compression as directed. May also use Urgo K2 compression system as alternative. Compression Wrap: Netting #5 1 x Per Week/30 Days Electronic Signature(s) Signed: 06/05/2022 11:24:22 AM By: [name] MD FACS Entered By: [name] on 06/05/2022 10:45:15 -------------------------------------------------------------------------------- Problem List Details Patient Name: Date of Service: Erin Philips [name] 06/05/2022 9:30 A M Medical Record Number: [id number] Patient Account Number: [id number] Date of Birth/Sex: Treating RN: Jul 15, 1955 (67 y.o. 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 06/05/2022 9:30 A M Medical Record Number: [id number] Patient Account Number: [id number] Date of Birth/Sex: Treating RN: [birth year] (67 y.o. F) Primary Care Provider: [name] Other Clinician: Referring Provider: Treating Provider/Extender: Jasmine Pang in Treatment: 19 Subjective Chief Complaint Information obtained from Patient 04/19/2021: The patient is here for ongoing follow-up regarding 2 left lower extremity wounds. 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 She tells [name] that she fell on the gravel driveway in July. She developed injuries on her distal lower leg which have not healed. She saw her primary physician on 11/15/2017 who noted her left shin injuries. Gave her antibiotics. At that point the wounds were almost circumferential however most were less than 1.5 cm. Weeping edema fluid was noted. She was referred here for evaluation. The patient has a history of chronic lower extremity edema. 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 She has had prior history with leg weeping fluid. She does not wear compression stockings. She is not doing anything specific to these wound areas. The patient has a history of obesity, arthritis, peripheral vascular disease hypertension lower extremity edema and Schamberg's disease ABI in our clinic was 1.3 on the left 12/17/2017; patient readmitted to  the clinic last week. 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 She also has lymphedema. Put her in 3 layer compression and silver alginate last week. She has 3 small wounds with depth just lateral to the tibia. More problematically than this she has numerous shallow areas some of which are almost canal like in shape with tightly adherent painful debris. It would be very difficult and time- consuming to go through this and attempt to individually debride all these areas.. I changed her to collagen today to see if that would help with any of the surface debris on some of these wounds. Otherwise we will not be able to put this in compression we have to have someone change the dressing. The patient is not eligible for home health 12/25/17 on evaluation today patient actually appears to be doing rather well in regard to the ulcer on her lower extremity. Fortunately there does not appear to be evidence of infection at this time. She has been tolerating the dressing changes without complication. This includes the compression wrap. The only issue she had was that the wrap was initially placed over her bunion region which actually calls her some discomfort and pain. Other than that things seem to be going rather well. 01/01/2018 Seen today for follow-up and management of left lower extremity wound and lymphedema. T oday she presents with a new wound towards to the left lateral LE. Recently treated with a 7 day course of amoxicillin; reason for antibiotic dose is unknown at this time. Tolerating current treatment of collagen with 4- layer wraps. She obtained a venous reflux study on 12/25/17. Studies show on  the right abnormal reflux times of the popliteal vein, great saphenous vein at the saphenofemoral junction at the proximal thigh, great saphenous vein at the mid calf, and origin of the small saphenous vein.No superficial thrombosis. No deep vein  thrombosis in the common femoral, femoral,and popliteal veins. Left abnormal reflex times as well of the common femoral vein, popliteal vein, and a great saphenous vein at the saphenofemoral junction, In great saphenous vein at the mid thigh w/o thrombosis. Has any issues or concerns during visit today. Recommended follow-up to vascular specialist due to abnormalities from the venous reflux study. Denies fever, pain, chills, dizziness, nausea, or vomiting. 01/08/18 upon evaluation today the patient actually seems to be showing some signs of improvement in my opinion at this point in regard to the lower extremity ulcerated areas. She still has a lot of drainage but fortunately nothing that appears to be too significant currently. I have been very happy with the overall progress I see today compared to where things were during the last evaluation that I had with her. Nonetheless she has not had her appointment with the vein specialist as of yet in fact we were able to get this approved for her today and confirmed with them she will be seeing them on December 26. Nonetheless in general I do feel like the compression wraps is doing well for her. 01/17/18; quite a bit of improvement since last time I saw this patient she has a small open area remaining on the left lateral calf and even smaller area medially. She has lymphedema chronic stasis changes with distal skin fibrosis. She has an appointment with vascular surgery later this month 01/24/2018; the patient's medial leg has closed. Still a small open area on the left lateral leg. She states that the 4 layer compression we put on last week was too tight and she had to take it off over a few days ago. We have had resultant increase in her lymphedema in the dorsal foot and a proximal calf. Fortunately that does not seem to have resulted in any deterioration in her wounds The patient is going to need compression stockings. We have given her measurements to  phone elastic therapy in [place]. She has vascular surgery consult on December 26 02/07/18; she's had continuous contraction on the left lateral leg wound which is now very small. Currently using silver alginate under 3 layer compression She saw Dr. Myra Gianotti of vascular surgery on 02/06/18. It was noted that she had a normal reflux times in the popliteal vein, great saphenous vein at the saphenofemoral junction, great saphenous vein at the proximal thigh great saphenous vein at the mid calf and origin of the small saphenous vein. It was noted that she had significant reflux in the left saphenous veins with diameter measurements in the 0.7-0.8 cm range. It was felt she would benefit from laser ablation to help minimize the risk of ulcer recurrence. It was recommended that she wear 20-30 thigh-high compression stockings and have follow-up in 4-6 weeks 02/17/2018; I thought this lady would be healed however her compression slipped down and she developed increasing swelling and the wound is actually larger. 1/13; we had deterioration last week after the patient's compression slipped down and she developed periwound swelling. I increased her compression before layers we have been using silver alginate we are a lot better again today. She has her compression stockings in waiting 1/23; the patient's wounds are totally healed today. She has her stockings. This was almost circumferential skin damage.  She follows up with Dr. Myra Gianotti of vascular surgery next Monday. READMISSION 02/21/2021 This is a now 67 year old woman that we had in clinic here discharging in January 2020 with wounds on her left calf chronic venous insufficiency. She was discharged with 30/40 stockings. It does not sound like she has worn stockings in about a year largely from not being able to get them on herself. In November she developed new blisters on her legs an area laterally is opened into a fairly sizable wound. She has weeping  posteriorly as well. She has been using Neosporin and Band-Aids. The patient did see Dr. Myra Gianotti in 2019 and 2020. He felt she might benefit from laser ablation in her left saphenous veins although because of the wound that healed I do not think he went through with it. She might benefit from seeing him again. Her ABI on the left is 1. 1/17; patient's wound on the posterior left calf is closed she has the 2 large areas last week. We put her in 4-layer compression for edema control is a lot better. Our intake nurse noted greenish drainage and odor. We have been using silver alginate 1/25; PCR culture I did have the substantial wound area on the left lateral lower leg showed staff aureus and group A strep. Low titers of coag negative staph which are probably skin contaminants. Resistance detected to tetracycline methicillin and macrolides. I gave her a starter kit of Nuzyra 150 mg x 3 for 2 days then 300 mg for a further 7 days. Marked odor considerable increase in surrounding erythema. We are using Iodoflex last week however I have changed to silver alginate with underlying Bactroban 2/1; she is completing her [place] tomorrow. The degree of erythema around the wounds looks a lot better. Odor has improved. I gave her silver alginate and Bactroban last week and changing her back to Iodoflex to continue with ongoing debridement 2/8; the periwound looks a lot better. Surface of the wound also looks somewhat better although there is still ongoing debridement to be done we have been using Iodoflex under compression. Primary dressing and silver alginate 2/15; left posterior calf. Some improvement in the surface of the wound but still very gritty we have been using Iodoflex under compression. [name], [name] ([id number]) [id number] Page 8 of 13 04/05/2021: Left lateral posterior calf. She continues to have significant amounts of drainage, some of which is probably comprised of  the Iodoflex microbleeds. There is no odor to the drainage. The satellite lesion on the more posterior aspect of the calf is epithelializing nicely and has contracted quite a bit. There is robust granulation tissue in the dominant wound with minimal adherent slough. 04/12/2021: The satellite lesion has nearly closed. She continues to have good granulation tissue with minimal slough of the larger primary wound. She continues to have a fair amount of drainage, but I think this is less secondary to switching her dressing from Iodoflex to Prisma. 04/19/2021: The satellite lesion is almost completely epithelialized. The larger primary wound continues to contract with good granulation tissue and minimal slough. She is currently in [place] with compression. 04/26/2021: The satellite lesion has closed. The larger primary wound has contracted further and the granulation tissue is robust without being hypertrophic. Minimal slough is present. 05/03/2021: The satellite lesion remains closed. The larger primary wound has a bit of slough, but has also contracted further with good perimeter epithelialization. Good granulation tissue at the wound surface. There was some greenish drainage on the dressing when it  was removed; it is not the blue-green typically associated with Pseudomonas aeruginosa, however. 05/10/2021: The primary wound continues to contract. There is minimal slough. The granulation tissue at 9:00 is a bit hypertrophic. No significant drainage. No odor. 05/17/2021: The wound is a little bit smaller today with good granulation tissue. Minimal slough. No significant drainage or odor. 05/24/2021: The wound continues to contract and has a nice base of granulation tissue. Small amount of slough. No concern for infection. 05/31/2021: For some reason, the wound measured slightly larger today but overall it still appears to be in good condition with a nice base of granulation tissue and minimal slough. 06/07/2021: The  wound is smaller today. Good granulation tissue and minimal slough. 06/14/2021: The wound is unchanged in size. She continues to accumulate some slough. Good granulation tissue on the surface. 06/20/2021: The wound is smaller today. Minimal slough with good granulation tissue. 06/27/2021: The wound on her left lateral leg is smaller today with just a bit of slough and eschar accumulation. Unfortunately, she has opened a new superficial wound on her right lower extremity. She has 3+ pitting edema to the knees on that leg and she does not wear compression stockings. 07/04/2021: In addition to the superficial wound on her right lower extremity that she opened up last week, she has opened 3 additional sites on the same leg. Her compression wraps were clearly not in correct position when she came to clinic today as they were at about the mid calf level rather than to the tibial tuberosity. She says that they slipped earlier and she had to come back on Friday to have them redone. The wound on her left lateral leg is perhaps slightly larger with some slough accumulation. 07/11/2021: The superficial wounds on her right lower extremity are nearly closed. They just have a thin layer of eschar overlying them. The wound on her left lateral leg is a little bit shallower and has a bit of slough accumulation. 07/17/2021: The right medial lower extremity leg wounds have almost completely closed; one of them just has a tiny opening with a little bit of serous drainage. The left lateral leg wound is really unchanged. It seems to be stalled. 07/24/2021: The right leg wounds are completely closed. The left lateral leg wound is actually bigger today. It is a bit more tender. There is a minor accumulation of slough on the surface. 07/31/2021: The culture that I took last week was positive for MRSA. We added mupirocin under the silver alginate and I prescribed doxycycline. She has been tolerating this well. The wound is smaller today  but does have slough accumulation. No significant pain or drainage. 08/07/2021: She completed her course of doxycycline. The wound looks much better today. It is smaller and the periwound is less inflamed. She does have some slough accumulation on the surface. 08/14/2021: The wound continues to contract. There is slough on the wound surface, but the periwound is intact without inflammation or induration. 08/21/2021: The wound is down to just 2 small open sites. They both have a bit of slough accumulation. 08/28/2021: The wound is down to just 1 small open site with a little bit of slough and eschar accumulation. 09/04/2021: The wound continues to contract but remains open. It is clean without any slough. 09/12/2021: No real change in the overall wound dimensions, but it is flush with the surrounding skin. There is a little bit of slough accumulation. Edema control is good. 09/22/2021: The wound is smaller and even more superficial.  Minimal slough accumulation. Good control of edema. 09/29/2021: The wound continues to contract. She reports that it was a little bit "twingey" during the week. It is a little bit dry on inspection today. Light accumulation of slough. Good edema control. 10/06/2021: The wound is about the same size today. There is a little slough on the surface. Edema control is good. 10/13/2021: The wound is about the same size but more superficial. A little bit of slough on the surface. 10/23/2021: The wound is slightly smaller and continues to fill in. Minimal slough on the wound surface. 10/30/2021: The wound continues to contract but has not yet closed. Light slough and eschar present. 11/06/2021: The wound persists, but seems a little bit more epithelialized. There is still slough and eschar accumulation. 11/14/2021: The wound continues to contract but persists. Light eschar around the wound. 11/22/2021: The wound is down to just a pinhole opening. There is eschar overlying the surface. Edema  control is excellent. 11/29/2021: Her wound is closed. READMISSION 01/22/2022 This is a patient to be discharged in October. She has severe chronic venous insufficiency at that time she had a wound on her left lower leg posteriorly also ZYKERIAH, MATHIA [name] ([id number]) [id number] Page 9 of 13 the right leg at some point. These closed. We discharged her in 30/40 mm stockings from elastic therapy which she has been wearing religiously. She tells [name] that she was traumatized by a dolly while shopping at [place] about 2 to 3 weeks ago. She has been left with a left anterior lower leg wound. She comes in in another pair of stockings other than her 3040s. She does not have an arterial issue with her last ABI in the left at 1.2 12/18; this is a patient who has chronic venous insufficiency and recurrent venous insufficiency ulcers. She has a area on her left anterior lower leg and we readmitted her to the clinic last week. We use silver alginate and 4-layer compression. ABI was 1.2 She brought in her stocking which was a 20/30 below-knee stocking from elastic therapy. She may require a 30/40 mm equivalent stockings after this wound heals. 02/06/2022: The intake nurse reported an odor coming from the wound when she first unwrapped it but this abated after the leg was washed. There is thick layer of slough on the surface. 02/13/2022: The wound is cleaner today. It measured larger, but on visual inspection, I think some crust of Iodoflex was included in the wound measurements; the actual wound itself looks about the same to slightly smaller. Edema control is good. 02/20/2022: The wound is cleaner again today. It is also measuring a little bit smaller, but there has been some moisture related periwound breakdown. Edema control is good. 02/27/2022: The intake nurse reported that the wound measures larger, but some of this ended up being crusted on Iodoflex. There is still some  periwound moisture. Still with thick slough accumulation. Edema control is good. 03/06/2022: The wound is cleaner and more superficial, but did measure larger because of the inclusion of a satellite area. Still with some slough accumulation but less so than on prior visits. 03/13/2022: The wound measured a little bit smaller today. There is slough and eschar accumulation, as per usual. 03/20/2022: Her wound is larger and deeper today. The entire surface appears nonviable. There is more periwound erythema and threatened tissue breakdown. 03/27/2022: The culture that I took last week was positive for MRSA. We had empirically applied topical mupirocin to her wound. Today the wound is smaller and  cleaner and less painful. There is still a layer of slough on the surface. 04/03/2022: The wound was measured slightly larger today, but appears roughly the same to my eye. There is a layer of slough on the surface. Underneath, the tissue is quite fibrotic. 04/10/2022: The wound is slightly smaller today. There is slough and eschar accumulation. For some reason, her wrap got changed from 4 layers to 3 layers and her edema control is not as good, as a result. 04/17/2022: The return to true 4-layer compression has improved the wound considerably. It is much smaller and cleaner today. Edema control is excellent. 04/24/2022: Her wound continues to improve. It is smaller with just a bit of slough on the surface. Edema control is excellent. 05/08/2022: Her wound is smaller again today. There is still slough accumulation on the surface. Edema control remains excellent. 05/15/2022: The wound is substantially smaller this week. There is slough on the surface with excellent edema control. 05/22/2022: Her wound is smaller again this week. Minimal slough on the surface. Good edema control. 05/29/2022: Once again, her wound is smaller. There is a bit of slough accumulation. Edema control remains excellent. 06/05/2022: Her wound is smaller  again by about half. There is some slough and eschar buildup. Edema control is consistently excellent. Patient History Information obtained from Patient. Family History Heart Disease - Mother,Father, Hypertension - Mother,Father, Kidney Disease - Mother, Thyroid Problems - Mother, No family history of Cancer, Diabetes, Hereditary Spherocytosis, Lung Disease, Seizures, Stroke, Tuberculosis. Social History Never smoker, Marital Status - Single, Alcohol Use - Never, Drug Use - No History, Caffeine Use - Daily - coffee. Medical History Eyes Patient has history of Cataracts Hematologic/Lymphatic Patient has history of Lymphedema Cardiovascular Patient has history of Hypertension, Peripheral Venous Disease Integumentary (Skin) Denies history of History of Burn Musculoskeletal Patient has history of Osteoarthritis Hospitalization/Surgery History - colostomy reversal. 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 Medical A Surgical History Notes nd Constitutional Symptoms (General Health) morbid obesity Cardiovascular schamberg disease, hyperlipidemia Gastrointestinal diverticulitis , h/o obstruction due to diverticulitis , colostomy and colostomy reversal Endocrine hypothyroidism [name], [name] ([id number]) [id number] Page 10 of 13 Objective Constitutional no acute distress. Vitals Time Taken: 9:51 AM, Height: 62 in, Weight: 222 lbs, BMI: 40.6, Temperature: 97.8 F, Pulse: 72 bpm, Respiratory Rate: 16 breaths/min, Blood Pressure: 116/72 mmHg. Respiratory Normal work of breathing on room air. General Notes: 06/05/2022: Her wound is smaller again by about half. There is some slough and eschar buildup. Edema control is consistently excellent. Integumentary (Hair, Skin) Wound #7 status is Open. Original cause of wound was Blister. The date acquired was: 01/22/2022. The wound has been in treatment 19 weeks. The wound is located on the Left,Anterior Lower Leg. The  wound measures 0.2cm length x 0.1cm width x 0.1cm depth; 0.016cm^2 area and 0.002cm^3 volume. There is Fat Layer (Subcutaneous Tissue) exposed. There is no tunneling or undermining noted. There is a medium amount of serosanguineous drainage noted. The wound margin is distinct with the outline attached to the wound base. There is large (67-100%) pink granulation within the wound bed. There is a small (1-33%) amount of necrotic tissue within the wound bed including Adherent Slough. The periwound skin appearance had no abnormalities noted for texture. The periwound skin appearance exhibited: Dry/Scaly, Maceration, Hemosiderin Staining. The periwound skin appearance did not exhibit: Erythema. Periwound temperature was noted as No Abnormality. 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 There was a Selective/Open Wound Non-Viable Tissue Debridement with a total area of 0.02 sq cm performed by [name], MD. With the following instrument(s): Curette to remove Non-Viable tissue/material. Material removed includes Eschar and Slough and after achieving pain control using Lidocaine 5% topical ointment. No specimens were taken. A time out was conducted at 10:15, prior to the start of the procedure. A Minimum amount of bleeding was controlled with Pressure. The procedure was tolerated well with a pain level of 0 throughout and a pain level of 0 following the procedure. Post Debridement Measurements: 0.2cm length x 0.1cm width x 0.1cm depth; 0.002cm^3 volume. Character of Wound/Ulcer Post Debridement is improved. Post procedure Diagnosis Wound #7: Same as Pre-Procedure General Notes: Scribed for Dr. Lady Gary by  J.[name]. Pre-procedure diagnosis of Wound #7 is a Lymphedema located on the Left,Anterior Lower Leg . There was a Four Layer Compression Therapy Procedure by Karie Schwalbe, RN. Post procedure Diagnosis Wound #7: Same as Pre-Procedure Plan Follow-up Appointments: Return Appointment in 1 week. - Dr. Lady Gary Room 3 Other: - Monday May 6th at 9:45 am Anesthetic: (In clinic) Topical Lidocaine 5% applied to wound bed Bathing/ Shower/ Hygiene: May shower with protection but do not get wound dressing(s) wet. Protect dressing(s) with water repellant cover (for example, large plastic bag) or a cast cover and may then take shower. - Please do not get the Left lower leg Compression wraps wet. Edema Control - Lymphedema / SCD / Other: Avoid standing for long periods of time. Patient to wear own compression stockings every day. Exercise regularly Moisturize legs daily. Off-Loading: Other: - Elevate feet on a pillow (behind the calf while in bed) WOUND #7: - Lower Leg Wound Laterality: Left, Anterior Cleanser: Soap and Water 1 x Per Week/30 Days Discharge Instructions: May shower and wash wound with dial antibacterial soap and water prior to dressing change. Cleanser: Wound Cleanser 1 x Per Week/30 Days Discharge Instructions: Cleanse the wound with wound cleanser prior to applying a clean dressing using gauze sponges, not tissue or cotton balls. [name], [name] ([id number]) [id number] Page 11 of 13 Peri-Wound Care: Ketoconazole Cream 2% 1 x Per Week/30 Days Discharge Instructions: Apply Ketoconazole as directed Peri-Wound Care: Triamcinolone 15 (g) 1 x Per Week/30 Days Discharge Instructions: Use triamcinolone 15 (g) as directed Peri-Wound Care: Zinc Oxide Ointment 30g tube 1 x Per Week/30 Days Discharge Instructions: Apply Zinc Oxide to periwound with each dressing change Peri-Wound Care: Sween Lotion (Moisturizing lotion) 1 x Per Week/30 Days Discharge  Instructions: Apply moisturizing lotion as directed Topical: Mupirocin Ointment 1 x Per Week/30 Days Discharge Instructions: Apply Mupirocin (Bactroban) as instructed Prim Dressing: Sorbalgon AG Dressing, 4x4 (in/in) 1 x Per Week/30 Days ary Discharge Instructions: Apply to wound bed as instructed Secondary Dressing: Woven Gauze Sponge, Non-Sterile 4x4 in 1 x Per Week/30 Days Discharge Instructions: Apply over primary dressing as directed. Secondary Dressing: Zetuvit Plus 4x8 in 1 x Per Week/30 Days Discharge Instructions: Apply over primary dressing as directed. Com pression Wrap: FourPress (4 layer compression wrap) 1 x Per Week/30 Days Discharge Instructions: Apply four layer compression as directed. May also use Urgo K2 compression system as alternative. Com pression Wrap: Netting #5 1 x Per Week/30 Days 06/05/2022: Her wound is smaller again by about half. There is some slough and eschar buildup. Edema control is consistently excellent. I used a curette  to debride slough and eschar from the wound. We will continue topical mupirocin with silver alginate and 4-layer compression. She will follow- up in 1 week. Electronic Signature(s) Signed: 06/07/2022 4:51:53 PM By: Shawn Stall RN, BSN Signed: 06/08/2022 7:52:44 AM By: [name] MD FACS Previous Signature: 06/05/2022 10:45:46 AM Version By: [name] MD FACS Entered By: Shawn Stall on 06/07/2022 16:48:34 -------------------------------------------------------------------------------- HxROS Details Patient Name: Date of Service: Erin Philips [name] 06/05/2022 9:30 A M Medical Record Number: [id number] Patient Account Number: [id number] Date of Birth/Sex: Treating RN: 28-May-1955 (67 y.o. F) Primary Care Provider: [name] Other Clinician: Referring Provider: Treating Provider/Extender: Jasmine Pang in Treatment: 19 Information Obtained From Patient Constitutional Symptoms (General  Health) Medical History: Past Medical History Notes: morbid obesity Eyes Medical History: Positive for: Cataracts Hematologic/Lymphatic Medical History: Positive for: Lymphedema Cardiovascular Medical History: Positive for: Hypertension; Peripheral Venous Disease Past Medical History Notes: schamberg disease, hyperlipidemia Gastrointestinal Medical History: Past Medical History Notes: diverticulitis , h/o obstruction due to diverticulitis , colostomy and colostomy reversal Endocrine [name], [name] ([id number]) [id number] Page 12 of 13 Medical History: Past Medical History Notes: hypothyroidism Integumentary (Skin) Medical History: Negative for: History of Burn Musculoskeletal Medical History: Positive for: Osteoarthritis HBO Extended History Items Eyes: Cataracts Immunizations Pneumococcal Vaccine: Received Pneumococcal Vaccination: Yes Received Pneumococcal Vaccination On or After 60th Birthday: Yes Implantable Devices None Hospitalization / Surgery History Type of Hospitalization/Surgery colostomy reversal 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 Family and Social History Cancer: No; Diabetes: No; Heart Disease: Yes - Mother,Father; Hereditary Spherocytosis: No; Hypertension: Yes - Mother,Father; Kidney Disease: Yes - Mother; Lung Disease: No; Seizures: No; Stroke: No; Thyroid Problems: Yes - Mother; Tuberculosis: No; Never smoker; Marital Status - Single; Alcohol Use: Never; Drug Use: No History; Caffeine Use: Daily - coffee; Financial Concerns: No; Food, Clothing or Shelter Needs: No; Support System Lacking: No; Transportation Concerns: No Electronic Signature(s) Signed: 06/05/2022 11:24:22 AM By: [name] MD FACS Entered By: [name] on 06/05/2022 10:43:57 -------------------------------------------------------------------------------- SuperBill Details Patient Name: Date of Service: Erin Philips [name] 06/05/2022 Medical  Record Number: [id number] Patient Account Number: [id number] Date of Birth/Sex: Treating RN: July 24, 1955 (67 y.o. 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

## 2022-06-06 NOTE — Progress Notes (Signed)
ROCIO, [name] ([id number]) [id number] Page 1 of 7 Visit Report for 06/05/2022 Arrival Information Details Patient Name: Date of Service: [name], [name] 06/05/2022 9:30 A M Medical Record Number: [id number] Patient Account Number: [id number] Date of Birth/Sex: Treating RN: October 05, 1955 (67 y.o. [name] Primary Care Malisha Mabey: [name] Other Clinician: Referring Theona Muhs: Treating Abubakar Crispo/Extender: Jasmine Pang in Treatment: 19 Visit Information History Since Last Visit Added or deleted any medications: No Patient Arrived: Cane Any new allergies or adverse reactions: No Arrival Time: 09:50 Had a fall or experienced change in No Accompanied By: self activities of daily living that may affect Transfer Assistance: None risk of falls: Patient Identification Verified: Yes Signs or symptoms of abuse/neglect since last visito No Patient Requires Transmission-Based Precautions: No Hospitalized since last visit: No Patient Has Alerts: No Implantable device outside of the clinic excluding No cellular tissue based products placed in the center since last visit: Has Dressing in Place as Prescribed: Yes Pain Present Now: Yes Electronic Signature(s) Signed: 06/05/2022 4:13:56 PM By: Karie Schwalbe RN Entered By: Karie Schwalbe on 06/05/2022 09:51:32 -------------------------------------------------------------------------------- Compression Therapy Details Patient Name: Date of Service: Erin Philips D. 06/05/2022 9:30 A M Medical Record Number: [id number] Patient Account Number: [id number] Date of Birth/Sex: Treating RN: 22-Jul-1955 (67 y.o. [name] Primary Care Cella Cappello: [name] Other Clinician: Referring Cedrick Partain: Treating Oanh Devivo/Extender: Jasmine Pang in Treatment: 19 Compression Therapy Performed for Wound Assessment: Wound #7 Left,Anterior Lower Leg Performed By:  Clinician Karie Schwalbe, RN Compression Type: Four Layer Post Procedure Diagnosis Same as Pre-procedure Electronic Signature(s) Signed: 06/05/2022 4:13:56 PM By: Karie Schwalbe RN Entered By: Karie Schwalbe on 06/05/2022 11:29:41 -------------------------------------------------------------------------------- Encounter Discharge Information Details Patient Name: Date of Service: Erin Philips D. 06/05/2022 9:30 A M Medical Record Number: [id number] Patient Account Number: [id number] Date of Birth/Sex: Treating RN: 10/06/1955 (67 y.o. [name] Primary Care Morayo Leven: [name] Other Clinician: Referring Ranvir Renovato: Treating Tamir Wallman/Extender: Jasmine Pang in Treatment: 19 Encounter Discharge Information Items Post Procedure Vitals Discharge Condition: Stable Temperature (F): 97.8 Ambulatory Status: Cane Pulse (bpm): 72 Discharge Destination: Home Respiratory Rate (breaths/min): 16 Transportation: Private Auto Blood Pressure (mmHg): 116/72 Accompanied By: self Martie Round ([id number]) [id number] Page 2 of 7 Schedule Follow-up Appointment: Yes Clinical Summary of Care: Patient Declined Electronic Signature(s) Signed: 06/05/2022 4:13:56 PM By: Karie Schwalbe RN Entered By: Karie Schwalbe on 06/05/2022 11:30:46 -------------------------------------------------------------------------------- Lower Extremity Assessment Details Patient Name: Date of Service: Erin, [name] 06/05/2022 9:30 A M Medical Record Number: [id number] Patient Account Number: [id number] Date of Birth/Sex: Treating RN: [birth year] (67 y.o. [name] Primary Care Chuck Caban: [name] Other Clinician: Referring Emilene Roma: Treating Jamoni Hewes/Extender: Jasmine Pang in Treatment: 19 Edema Assessment Assessed: [Left: No] [Right: No] [Left: Edema] [Right: :] Calf Left: Right: Point of Measurement:  From Medial Instep 39.2 cm Ankle Left: Right: Point of Measurement: From Medial Instep 20.8 cm Vascular Assessment Pulses: Dorsalis Pedis Palpable: [Left:Yes] Electronic Signature(s) Signed: 06/05/2022 4:13:56 PM By: Karie Schwalbe RN Entered By: Karie Schwalbe on 06/05/2022 10:05:23 -------------------------------------------------------------------------------- Multi Wound Chart Details Patient Name: Date of Service: Erin Philips D. 06/05/2022 9:30 A M Medical Record Number: [id number] Patient Account Number: [id number] Date of Birth/Sex: Treating RN: 1955/09/19 (66 y.o. F) Primary Care Lanayah Gartley: [name] Other Clinician: Referring Narely Nobles: Treating Dinesha Twiggs/Extender: Jasmine Pang in Treatment: 19 Vital Signs Height(in): 62 Pulse(bpm): 72 Weight(lbs): 222 Blood Pressure(mmHg): 116/72 Body Mass Index(BMI):  40.6 Temperature(F): 97.8 Respiratory Rate(breaths/min): 16 [7:Photos:] [N/A:N/A] Left, Anterior Lower Leg N/A N/A Wound Location: Blister N/A N/A Wounding Event: Lymphedema N/A N/A Primary Etiology: Cataracts, Lymphedema, N/A N/A Comorbid History: Hypertension, Peripheral Venous Disease, Osteoarthritis 01/22/2022 N/A N/A Date Acquired: 97 N/A N/A Weeks of Treatment: Open N/A N/A Wound Status: No N/A N/A Wound Recurrence: 0.2x0.1x0.1 N/A N/A Measurements L x W x D (cm) 0.016 N/A N/A A (cm) : rea 0.002 N/A N/A Volume (cm) : 99.50% N/A N/A % Reduction in A rea: 99.70% N/A N/A % Reduction in Volume: Full Thickness Without Exposed N/A N/A Classification: Support Structures Medium N/A N/A Exudate A mount: Serosanguineous N/A N/A Exudate Type: red, brown N/A N/A Exudate Color: Distinct, outline attached N/A N/A Wound Margin: Large (67-100%) N/A N/A Granulation A mount: Pink N/A N/A Granulation Quality: Small (1-33%) N/A N/A Necrotic A mount: Fat Layer (Subcutaneous Tissue): Yes N/A N/A Exposed  Structures: Fascia: No Tendon: No Muscle: No Joint: No Bone: No Small (1-33%) N/A N/A Epithelialization: Debridement - Selective/Open Wound N/A N/A Debridement: Pre-procedure Verification/Time Out 10:15 N/A N/A Taken: Lidocaine 5% topical ointment N/A N/A Pain Control: Necrotic/Eschar, Slough N/A N/A Tissue Debrided: Non-Viable Tissue N/A N/A Level: 0.02 N/A N/A Debridement A (sq cm): rea Curette N/A N/A Instrument: Minimum N/A N/A Bleeding: Pressure N/A N/A Hemostasis A chieved: 0 N/A N/A Procedural Pain: 0 N/A N/A Post Procedural Pain: Procedure was tolerated well N/A N/A Debridement Treatment Response: 0.2x0.1x0.1 N/A N/A Post Debridement Measurements L x W x D (cm) 0.002 N/A N/A Post Debridement Volume: (cm) No Abnormalities Noted N/A N/A Periwound Skin Texture: Maceration: Yes N/A N/A Periwound Skin Moisture: Dry/Scaly: Yes Hemosiderin Staining: Yes N/A N/A Periwound Skin Color: Erythema: No No Abnormality N/A N/A Temperature: Debridement N/A N/A Procedures Performed: Treatment Notes Electronic Signature(s) Signed: 06/05/2022 10:42:47 AM By: [name] MD FACS Entered By: [name] on 06/05/2022 10:42:46 -------------------------------------------------------------------------------- Multi-Disciplinary Care Plan Details Patient Name: Date of Service: Erin Philips D. 06/05/2022 9:30 A M Medical Record Number: [id number] Patient Account Number: [id number] Date of Birth/Sex: Treating RN: 01-16-1956 (67 y.o. [name] Primary Care Adalae Baysinger: [name] Other Clinician: Referring Keniya Schlotterbeck: Treating Mardene Lessig/Extender: Jasmine Pang in Treatment: 8 Active Inactive Abuse / Safety / Falls / Self Care Management ADLYNN, LOWENSTEIN D ([id number]) [id number] Page 4 of 7 Nursing Diagnoses: History of Falls Impaired physical mobility Goals: Patient/caregiver will identify factors  that restrict self-care and home management Date Initiated: 01/22/2022 Target Resolution Date: 09/11/2022 Goal Status: Active Interventions: Assess fall risk on admission and as needed Assess: immobility, friction, shearing, incontinence upon admission and as needed Notes: Wound/Skin Impairment Nursing Diagnoses: Impaired tissue integrity Knowledge deficit related to ulceration/compromised skin integrity Goals: Patient/caregiver will verbalize understanding of skin care regimen Date Initiated: 01/22/2022 Target Resolution Date: 09/11/2022 Goal Status: Active Interventions: Assess ulceration(s) every visit Treatment Activities: Skin care regimen initiated : 01/22/2022 Topical wound management initiated : 01/22/2022 Notes: Electronic Signature(s) Signed: 06/05/2022 4:13:56 PM By: Karie Schwalbe RN Entered By: Karie Schwalbe on 06/05/2022 11:28:47 -------------------------------------------------------------------------------- Pain Assessment Details Patient Name: Date of Service: Erin Philips D. 06/05/2022 9:30 A M Medical Record Number: [id number] Patient Account Number: [id number] Date of Birth/Sex: Treating RN: April 05, 1955 (67 y.o. [name] Primary Care Kelby Adell: [name] Other Clinician: Referring Charles Niese: Treating Sanel Stemmer/Extender: Jasmine Pang in Treatment: 19 Active Problems Location of Pain Severity and Description of Pain Patient Has Paino Yes Site Locations Pain Location: Generalized Pain With Dressing Change: No Duration  of the Pain. Constant / Intermittento Constant Rate the pain. Current Pain Level: 6 Worst Pain Level: 10 Least Pain Level: 3 Tolerable Pain Level: 4 Character of Pain Describe the Pain: WESLYNN, KE ([id number]) [id number] Page 5 of 7 Pain Management and Medication Current Pain Management: Medication: Yes Cold Application: No Rest: Yes Massage:  No Activity: No T.E.N.S.: No Heat Application: No Leg drop or elevation: No Is the Current Pain Management Adequate: Adequate How does your wound impact your activities of daily livingo Sleep: No Bathing: No Appetite: No Relationship With Others: No Bladder Continence: No Emotions: No Bowel Continence: No Work: No Toileting: No Drive: No Dressing: No Hobbies: No Electronic Signature(s) Signed: 06/05/2022 4:13:56 PM By: Karie Schwalbe RN Entered By: Karie Schwalbe on 06/05/2022 09:53:53 -------------------------------------------------------------------------------- Patient/Caregiver Education Details Patient Name: Date of Service: [name] 4/23/2024andnbsp9:30 A M Medical Record Number: [id number] Patient Account Number: [id number] Date of Birth/Gender: Treating RN: 12-Nov-1955 (67 y.o. [name] Primary Care Physician: [name] Other Clinician: Referring Physician: Treating Physician/Extender: Jasmine Pang in Treatment: 19 Education Assessment Education Provided To: Patient Education Topics Provided Wound/Skin Impairment: Methods: Explain/Verbal Responses: Return demonstration correctly Electronic Signature(s) Signed: 06/05/2022 4:13:56 PM By: Karie Schwalbe RN Entered By: Karie Schwalbe on 06/05/2022 11:29:02 -------------------------------------------------------------------------------- Wound Assessment Details Patient Name: Date of Service: DAMICA, GRAVLIN D. 06/05/2022 9:30 A M Medical Record Number: [id number] Patient Account Number: [id number] Date of Birth/Sex: Treating RN: 12/07/1955 (67 y.o. [name] Primary Care Ruchel Brandenburger: [name] Other Clinician: Referring Navpreet Szczygiel: Treating Anjanette Gilkey/Extender: Jasmine Pang in Treatment: 19 Wound Status Wound Number: 7 Primary Lymphedema Etiology: Wound Location: Left, Anterior Lower Leg Wound Open Wounding Event:  Blister Status: Date Acquired: 01/22/2022 Comorbid Cataracts, Lymphedema, Hypertension, Peripheral Venous Weeks Of Treatment: 19 History: Disease, Osteoarthritis Clustered Wound: No Photos DINA, WARBINGTON D ([id number]) [id number] Page 6 of 7 Wound Measurements Length: (cm) 0.2 Width: (cm) 0.1 Depth: (cm) 0.1 Area: (cm) 0.016 Volume: (cm) 0.002 % Reduction in Area: 99.5% % Reduction in Volume: 99.7% Epithelialization: Small (1-33%) Tunneling: No Undermining: No Wound Description Classification: Full Thickness Without Exposed Support Structures Wound Margin: Distinct, outline attached Exudate Amount: Medium Exudate Type: Serosanguineous Exudate Color: red, brown Foul Odor After Cleansing: No Slough/Fibrino Yes Wound Bed Granulation Amount: Large (67-100%) Exposed Structure Granulation Quality: Pink Fascia Exposed: No Necrotic Amount: Small (1-33%) Fat Layer (Subcutaneous Tissue) Exposed: Yes Necrotic Quality: Adherent Slough Tendon Exposed: No Muscle Exposed: No Joint Exposed: No Bone Exposed: No Periwound Skin Texture Texture Color No Abnormalities Noted: Yes No Abnormalities Noted: No Erythema: No Moisture Hemosiderin Staining: Yes No Abnormalities Noted: No Dry / Scaly: Yes Temperature / Pain Maceration: Yes Temperature: No Abnormality Treatment Notes Wound #7 (Lower Leg) Wound Laterality: Left, Anterior Cleanser Soap and Water Discharge Instruction: May shower and wash wound with dial antibacterial soap and water prior to dressing change. Wound Cleanser Discharge Instruction: Cleanse the wound with wound cleanser prior to applying a clean dressing using gauze sponges, not tissue or cotton balls. Peri-Wound Care Ketoconazole Cream 2% Discharge Instruction: Apply Ketoconazole as directed Triamcinolone 15 (g) Discharge Instruction: Use triamcinolone 15 (g) as directed Zinc Oxide Ointment 30g tube Discharge Instruction: Apply  Zinc Oxide to periwound with each dressing change Sween Lotion (Moisturizing lotion) Discharge Instruction: Apply moisturizing lotion as directed Topical Mupirocin Ointment Discharge Instruction: Apply Mupirocin (Bactroban) as instructed Primary Dressing Sorbalgon AG Dressing, 4x4 (in/in) Discharge Instruction: Apply to wound bed as instructed [name], [name] ([id number])  [id number] Page 7 of 7 Secondary Dressing Woven Gauze Sponge, Non-Sterile 4x4 in Discharge Instruction: Apply over primary dressing as directed. Zetuvit Plus 4x8 in Discharge Instruction: Apply over primary dressing as directed. Secured With Compression Wrap FourPress (4 layer compression wrap) Discharge Instruction: Apply four layer compression as directed. May also use Urgo K2 compression system as alternative. Netting #5 Compression Stockings Add-Ons Electronic Signature(s) Signed: 06/05/2022 4:13:56 PM By: Karie Schwalbe RN Entered By: Karie Schwalbe on 06/05/2022 10:12:14 -------------------------------------------------------------------------------- Vitals Details Patient Name: Date of Service: Erin Philips D. 06/05/2022 9:30 A M Medical Record Number: [id number] Patient Account Number: [id number] Date of Birth/Sex: Treating RN: [birth year] (67 y.o. [name] Primary Care Fanta Wimberley: [name] Other Clinician: Referring Nikhita Mentzel: Treating Taj Nevins/Extender: Jasmine Pang in Treatment: 19 Vital Signs Time Taken: 09:51 Temperature (F): 97.8 Height (in): 62 Pulse (bpm): 72 Weight (lbs): 222 Respiratory Rate (breaths/min): 16 Body Mass Index (BMI): 40.6 Blood Pressure (mmHg): 116/72 Reference Range: 80 - 120 mg / dl Electronic Signature(s) Signed: 06/05/2022 4:13:56 PM By: Karie Schwalbe RN Entered By: Karie Schwalbe on 06/05/2022 10:06:02

## 2022-06-12 ENCOUNTER — Encounter (HOSPITAL_BASED_OUTPATIENT_CLINIC_OR_DEPARTMENT_OTHER): Payer: Commercial Managed Care - PPO | Admitting: General Surgery

## 2022-06-12 DIAGNOSIS — I87312 Chronic venous hypertension (idiopathic) with ulcer of left lower extremity: Secondary | ICD-10-CM | POA: Diagnosis not present

## 2022-06-12 DIAGNOSIS — I890 Lymphedema, not elsewhere classified: Secondary | ICD-10-CM | POA: Diagnosis not present

## 2022-06-12 DIAGNOSIS — L97928 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: Secondary | ICD-10-CM | POA: Diagnosis not present

## 2022-06-13 NOTE — Progress Notes (Signed)
[name], [name] ([id number]) [id number] Page 1 of 13 Visit Report for 06/12/2022 Chief Complaint Document Details Patient Name: Date of Service: [name], [name] 06/12/2022 9:30 A M Medical Record Number: [id number] Patient Account Number: [id number] Date of Birth/Sex: Treating RN: [birth year] (67 y.o. F) Primary Care Provider: [name] Other Clinician: Referring Provider: Treating Provider/Extender: Jasmine Pang in Treatment: 20 Information Obtained from: Patient Chief Complaint 04/19/2021: The patient is here for ongoing follow-up regarding 2 left lower extremity wounds. 01/22/2022; patient returns to clinic with a wound on the left anterior lower leg secondary to trauma Electronic Signature(s) Signed: 06/12/2022 10:22:56 AM By: [name] MD FACS Entered By: [name] on 06/12/2022 10:22:56 -------------------------------------------------------------------------------- Debridement Details Patient Name: Date of Service: Erin Philips [name] 06/12/2022 9:30 A M Medical Record Number: [id number] Patient Account Number: [id number] Date of Birth/Sex: Treating RN: Jun 21, 1955 (67 y.o. [name] Primary Care Provider: [name] Other Clinician: Referring Provider: Treating Provider/Extender: Jasmine Pang in Treatment: 20 Debridement Performed for Assessment: Wound #7 Left,Anterior Lower Leg Performed By: Physician [name], MD Debridement Type: Debridement Level of Consciousness (Pre-procedure): Awake and Alert Pre-procedure Verification/Time Out Yes - 10:17 Taken: Start Time: 10:17 Pain Control: Lidocaine 4% Topical Solution Percent of Wound Bed Debrided: 100% T Area Debrided (cm): otal 0.2 Tissue and other material debrided: Non-Viable, Eschar, Slough, Slough Level: Non-Viable Tissue Debridement Description: Selective/Open Wound Instrument: Curette Bleeding:  Minimum Hemostasis Achieved: Pressure Response to Treatment: Procedure was tolerated well Level of Consciousness (Post- Awake and Alert procedure): Post Debridement Measurements of Total Wound Length: (cm) 0.5 Width: (cm) 0.5 Depth: (cm) 0.1 Volume: (cm) 0.02 Character of Wound/Ulcer Post Debridement: Improved Post Procedure Diagnosis Same as Pre-procedure Notes scribed for Dr. Lady Gary by Samuella Bruin, RN Electronic Signature(s) Signed: 06/12/2022 3:22:44 PM By: Samuella Bruin Signed: 06/12/2022 3:54:11 PM By: [name] MD FACS Entered By: Samuella Bruin on 06/12/2022 10:20:09 IRMGARD, RAMPERSAUD [name] ([id number]) [id number] Page 2 of 13 -------------------------------------------------------------------------------- HPI Details Patient Name: Date of Service: [name], [name] 06/12/2022 9:30 A M Medical Record Number: [id number] Patient Account Number: [id number] Date of Birth/Sex: Treating RN: [birth year] (67 y.o. 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 She tells [name] that she fell on the gravel driveway in July. She developed injuries on her distal lower leg which have not healed. She saw her primary physician on 11/15/2017 who noted her left shin injuries. Gave her antibiotics. At that point the wounds were almost circumferential however most were less than 1.5 cm. Weeping edema fluid was noted. She was referred here for evaluation. The patient has a history of chronic lower extremity edema. 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 She has had prior history with leg  weeping fluid. She does not wear compression stockings. She is not doing anything specific to these wound areas. 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 She also has lymphedema. Put her in 3 layer compression and  silver alginate last week. She has 3 small wounds with depth just lateral to the tibia. More problematically than this she has numerous shallow areas some of which are almost canal like in shape with tightly adherent painful debris. It would be very difficult and time- consuming to go through this and attempt to individually debride all these areas.. I changed her to collagen today to see if that would help with any of the surface debris on some of these wounds. Otherwise we will not be able to put this in compression we have to have someone change the dressing. The patient is not eligible for home health 12/25/17 on evaluation today patient actually appears to be doing rather well in regard to the ulcer on her lower extremity. Fortunately there does not appear to be evidence of infection at this time. She has been tolerating the dressing changes without complication. This includes the compression wrap. The only issue she had was that the wrap was initially placed over her bunion region which actually calls her some discomfort and pain. Other than that things seem to be going rather well. 01/01/2018 Seen today for follow-up and management of left lower extremity wound and lymphedema. T oday she presents with a new wound towards to the left lateral LE. Recently treated with a 7 day course of amoxicillin; reason for antibiotic dose is unknown at this time. Tolerating current treatment of collagen with 4- layer  wraps. She obtained a venous reflux study on 12/25/17. Studies show on the right abnormal reflux times of the popliteal vein, great saphenous vein at the saphenofemoral junction at the proximal thigh, great saphenous vein at the mid calf, and origin of the small saphenous vein.No superficial thrombosis. No deep vein thrombosis in the common femoral, femoral,and popliteal veins. Left abnormal reflex times as well of the common femoral vein, popliteal vein, and a great saphenous vein at the saphenofemoral junction, In great saphenous vein at the mid thigh w/o thrombosis. Has any issues or concerns during visit today. Recommended follow-up to vascular specialist due to abnormalities from the venous reflux study. Denies fever, pain, chills, dizziness, nausea, or vomiting. 01/08/18 upon evaluation today the patient actually seems to be showing some signs of improvement in my opinion at this point in regard to the lower extremity ulcerated areas. She still has a lot of drainage but fortunately nothing that appears to be too significant currently. I have been very happy with the overall progress I see today compared to where things were during the last evaluation that I had with her. Nonetheless she has not had her appointment with the vein specialist as of yet in fact we were able to get this approved for her today and confirmed with them she will be seeing them on December 26. Nonetheless in general I do feel like the compression wraps is doing well for her. 01/17/18; quite a bit of improvement since last time I saw this patient she has a small open area remaining on the left lateral calf and even smaller area medially. She has lymphedema chronic stasis changes with distal skin fibrosis. She has an appointment with vascular surgery later this month 01/24/2018; the patient's medial leg has closed. Still a small open area on the left lateral leg. She states that the 4 layer compression we put on last week  was too tight and she had to take it off over a few days ago. We have had resultant increase in her lymphedema in the dorsal foot and a  proximal calf. Fortunately that does not seem to have resulted in any deterioration in her wounds The patient is going to need compression stockings. We have given her measurements to phone elastic therapy in [place]. She has vascular surgery consult on December 26 02/07/18; she's had continuous contraction on the left lateral leg wound which is now very small. Currently using silver alginate under 3 layer compression She saw Dr. Myra Gianotti of vascular surgery on 02/06/18. It was noted that she had a normal reflux times in the popliteal vein, great saphenous vein at the saphenofemoral junction, great saphenous vein at the proximal thigh great saphenous vein at the mid calf and origin of the small saphenous vein. It was noted that she had significant reflux in the left saphenous veins with diameter measurements in the 0.7-0.8 cm range. It was felt she would benefit from laser ablation to help minimize the risk of ulcer recurrence. It was recommended that she wear 20-30 thigh-high compression stockings and have follow-up in 4-6 weeks 02/17/2018; I thought this lady would be healed however her compression slipped down and she developed increasing swelling and the wound is actually larger. 1/13; we had deterioration last week after the patient's compression slipped down and she developed periwound swelling. I increased her compression before layers we have been using silver alginate we are a lot better again today. She has her compression stockings in waiting 1/23; the patient's wounds are totally healed today. She has her stockings. This was almost circumferential skin damage. She follows up with Dr. Myra Gianotti of vascular surgery next Monday. READMISSION 02/21/2021 This is a now 67 year old woman that we had in clinic here discharging in January 2020 with wounds on her left  calf chronic venous insufficiency. She was discharged with 30/40 stockings. It does not sound like she has worn stockings in about a year largely from not being able to get them on herself. In November she developed new blisters on her legs an area laterally is opened into a fairly sizable wound. She has weeping posteriorly as well. She has been using Neosporin and Band-Aids. The patient did see Dr. Myra Gianotti in 2019 and 2020. He felt she might benefit from laser ablation in her left saphenous veins although because of the wound that healed I do not think he went through with it. She might benefit from seeing him again. Her ABI on the left is 1. [name], [name] ([id number]) [id number] Page 3 of 13 1/17; patient's wound on the posterior left calf is closed she has the 2 large areas last week. We put her in 4-layer compression for edema control is a lot better. Our intake nurse noted greenish drainage and odor. We have been using silver alginate 1/25; PCR culture I did have the substantial wound area on the left lateral lower leg showed staff aureus and group A strep. Low titers of coag negative staph which are probably skin contaminants. Resistance detected to tetracycline methicillin and macrolides. I gave her a starter kit of Nuzyra 150 mg x 3 for 2 days then 300 mg for a further 7 days. Marked odor considerable increase in surrounding erythema. We are using Iodoflex last week however I have changed to silver alginate with underlying Bactroban 2/1; she is completing her [place] tomorrow. The degree of erythema around the wounds looks a lot better. Odor has improved. I gave her silver alginate and Bactroban last week and changing her back to Iodoflex to continue with ongoing debridement 2/8; the periwound looks a lot better. Surface  of the wound also looks somewhat better although there is still ongoing debridement to be done we have been using Iodoflex under compression.  Primary dressing and silver alginate 2/15; left posterior calf. Some improvement in the surface of the wound but still very gritty we have been using Iodoflex under compression. 04/05/2021: Left lateral posterior calf. She continues to have significant amounts of drainage, some of which is probably comprised of the Iodoflex microbleeds. There is no odor to the drainage. The satellite lesion on the more posterior aspect of the calf is epithelializing nicely and has contracted quite a bit. There is robust granulation tissue in the dominant wound with minimal adherent slough. 04/12/2021: The satellite lesion has nearly closed. She continues to have good granulation tissue with minimal slough of the larger primary wound. She continues to have a fair amount of drainage, but I think this is less secondary to switching her dressing from Iodoflex to Prisma. 04/19/2021: The satellite lesion is almost completely epithelialized. The larger primary wound continues to contract with good granulation tissue and minimal slough. She is currently in [place] with compression. 04/26/2021: The satellite lesion has closed. The larger primary wound has contracted further and the granulation tissue is robust without being hypertrophic. Minimal slough is present. 05/03/2021: The satellite lesion remains closed. The larger primary wound has a bit of slough, but has also contracted further with good perimeter epithelialization. Good granulation tissue at the wound surface. There was some greenish drainage on the dressing when it was removed; it is not the blue-green typically associated with Pseudomonas aeruginosa, however. 05/10/2021: The primary wound continues to contract. There is minimal slough. The granulation tissue at 9:00 is a bit hypertrophic. No significant drainage. No odor. 05/17/2021: The wound is a little bit smaller today with good granulation tissue. Minimal slough. No significant drainage or odor. 05/24/2021: The wound  continues to contract and has a nice base of granulation tissue. Small amount of slough. No concern for infection. 05/31/2021: For some reason, the wound measured slightly larger today but overall it still appears to be in good condition with a nice base of granulation tissue and minimal slough. 06/07/2021: The wound is smaller today. Good granulation tissue and minimal slough. 06/14/2021: The wound is unchanged in size. She continues to accumulate some slough. Good granulation tissue on the surface. 06/20/2021: The wound is smaller today. Minimal slough with good granulation tissue. 06/27/2021: The wound on her left lateral leg is smaller today with just a bit of slough and eschar accumulation. Unfortunately, she has opened a new superficial wound on her right lower extremity. She has 3+ pitting edema to the knees on that leg and she does not wear compression stockings. 07/04/2021: In addition to the superficial wound on her right lower extremity that she opened up last week, she has opened 3 additional sites on the same leg. Her compression wraps were clearly not in correct position when she came to clinic today as they were at about the mid calf level rather than to the tibial tuberosity. She says that they slipped earlier and she had to come back on Friday to have them redone. The wound on her left lateral leg is perhaps slightly larger with some slough accumulation. 07/11/2021: The superficial wounds on her right lower extremity are nearly closed. They just have a thin layer of eschar overlying them. The wound on her left lateral leg is a little bit shallower and has a bit of slough accumulation. 07/17/2021: The right medial lower extremity  leg wounds have almost completely closed; one of them just has a tiny opening with a little bit of serous drainage. The left lateral leg wound is really unchanged. It seems to be stalled. 07/24/2021: The right leg wounds are completely closed. The left lateral leg wound  is actually bigger today. It is a bit more tender. There is a minor accumulation of slough on the surface. 07/31/2021: The culture that I took last week was positive for MRSA. We added mupirocin under the silver alginate and I prescribed doxycycline. She has been tolerating this well. The wound is smaller today but does have slough accumulation. No significant pain or drainage. 08/07/2021: She completed her course of doxycycline. The wound looks much better today. It is smaller and the periwound is less inflamed. She does have some slough accumulation on the surface. 08/14/2021: The wound continues to contract. There is slough on the wound surface, but the periwound is intact without inflammation or induration. 08/21/2021: The wound is down to just 2 small open sites. They both have a bit of slough accumulation. 08/28/2021: The wound is down to just 1 small open site with a little bit of slough and eschar accumulation. 09/04/2021: The wound continues to contract but remains open. It is clean without any slough. 09/12/2021: No real change in the overall wound dimensions, but it is flush with the surrounding skin. There is a little bit of slough accumulation. Edema control is good. 09/22/2021: The wound is smaller and even more superficial. Minimal slough accumulation. Good control of edema. 09/29/2021: The wound continues to contract. She reports that it was a little bit "twingey" during the week. It is a little bit dry on inspection today. Light accumulation of slough. Good edema control. 10/06/2021: The wound is about the same size today. There is a little slough on the surface. Edema control is good. 10/13/2021: The wound is about the same size but more superficial. A little bit of slough on the surface. 10/23/2021: The wound is slightly smaller and continues to fill in. Minimal slough on the wound surface. [name], [name] ([id number]) [id number] Page 4 of 13 10/30/2021: The wound  continues to contract but has not yet closed. Light slough and eschar present. 11/06/2021: The wound persists, but seems a little bit more epithelialized. There is still slough and eschar accumulation. 11/14/2021: The wound continues to contract but persists. Light eschar around the wound. 11/22/2021: The wound is down to just a pinhole opening. There is eschar overlying the surface. Edema control is excellent. 11/29/2021: Her wound is closed. READMISSION 01/22/2022 This is a patient to be discharged in October. She has severe chronic venous insufficiency at that time she had a wound on her left lower leg posteriorly also the right leg at some point. These closed. We discharged her in 30/40 mm stockings from elastic therapy which she has been wearing religiously. She tells [name] that she was traumatized by a dolly while shopping at [employer] about 2 to 3 weeks ago. She has been left with a left anterior lower leg wound. She comes in in another pair of stockings other than her 3040s. She does not have an arterial issue with her last ABI in the left at 1.2 12/18; this is a patient who has chronic venous insufficiency and recurrent venous insufficiency ulcers. She has a area on her left anterior lower leg and we readmitted her to the clinic last week. We use silver alginate and 4-layer compression. ABI was 1.2 She brought in her stocking  which was a 20/30 below-knee stocking from elastic therapy. She may require a 30/40 mm equivalent stockings after this wound heals. 02/06/2022: The intake nurse reported an odor coming from the wound when she first unwrapped it but this abated after the leg was washed. There is thick layer of slough on the surface. 02/13/2022: The wound is cleaner today. It measured larger, but on visual inspection, I think some crust of Iodoflex was included in the wound measurements; the actual wound itself looks about the same to slightly smaller. Edema control is good. 02/20/2022: The wound is  cleaner again today. It is also measuring a little bit smaller, but there has been some moisture related periwound breakdown. Edema control is good. 02/27/2022: The intake nurse reported that the wound measures larger, but some of this ended up being crusted on Iodoflex. There is still some periwound moisture. Still with thick slough accumulation. Edema control is good. 03/06/2022: The wound is cleaner and more superficial, but did measure larger because of the inclusion of a satellite area. Still with some slough accumulation but less so than on prior visits. 03/13/2022: The wound measured a little bit smaller today. There is slough and eschar accumulation, as per usual. 03/20/2022: Her wound is larger and deeper today. The entire surface appears nonviable. There is more periwound erythema and threatened tissue breakdown. 03/27/2022: The culture that I took last week was positive for MRSA. We had empirically applied topical mupirocin to her wound. Today the wound is smaller and cleaner and less painful. There is still a layer of slough on the surface. 04/03/2022: The wound was measured slightly larger today, but appears roughly the same to my eye. There is a layer of slough on the surface. Underneath, the tissue is quite fibrotic. 04/10/2022: The wound is slightly smaller today. There is slough and eschar accumulation. For some reason, her wrap got changed from 4 layers to 3 layers and her edema control is not as good, as a result. 04/17/2022: The return to true 4-layer compression has improved the wound considerably. It is much smaller and cleaner today. Edema control is excellent. 04/24/2022: Her wound continues to improve. It is smaller with just a bit of slough on the surface. Edema control is excellent. 05/08/2022: Her wound is smaller again today. There is still slough accumulation on the surface. Edema control remains excellent. 05/15/2022: The wound is substantially smaller this week. There is slough on  the surface with excellent edema control. 05/22/2022: Her wound is smaller again this week. Minimal slough on the surface. Good edema control. 05/29/2022: Once again, her wound is smaller. There is a bit of slough accumulation. Edema control remains excellent. 06/05/2022: Her wound is smaller again by about half. There is some slough and eschar buildup. Edema control is consistently excellent. 06/12/2022: The wound was initially measured larger by couple of millimeters, but upon debridement, much of this was found to be slough and eschar. The wound is actually about the same size. Electronic Signature(s) Signed: 06/12/2022 10:31:52 AM By: [name] MD FACS Entered By: [name] on 06/12/2022 10:31:52 -------------------------------------------------------------------------------- Physical Exam Details Patient Name: Date of Service: Erin Rosier NITA [name] 06/12/2022 9:30 A M Medical Record Number: [id number] Patient Account Number: [id number] Date of Birth/Sex: Treating RN: November 17, 1955 (67 y.o. F) Primary Care Provider: [name] Other Clinician: Referring Provider: Treating Provider/Extender: Jasmine Pang in Treatment: [address], [place] [name] ([id number]) [id number] Page 5 of 13 Constitutional Hypertensive, asymptomatic. . . . no acute distress. Respiratory Normal  work of breathing on room air. Notes 06/12/2022: The wound was initially measured larger by couple of millimeters, but upon debridement, much of this was found to be slough and eschar. The wound is actually about the same size. Electronic Signature(s) Signed: 06/12/2022 10:35:59 AM By: [name] MD FACS Entered By: [name] on 06/12/2022 10:35:58 -------------------------------------------------------------------------------- Physician Orders Details Patient Name: Date of Service: Erin Rosier NITA [name] 06/12/2022 9:30 A M Medical Record Number:  [id number] Patient Account Number: [id number] Date of Birth/Sex: Treating RN: December 30, 1955 (67 y.o. 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 - Dr. Lady Gary Room 3 Return A Anesthetic (In clinic) Topical Lidocaine 4% applied to wound bed Bathing/ Shower/ Hygiene May shower with protection but do not get wound dressing(s) wet. Protect dressing(s) with water repellant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 no acute distress. Vitals Time Taken: 9:47 AM,  Height: 62 in, Weight: 222 lbs, BMI: 40.6, Temperature: 97.8 F, Pulse: 87 bpm, Respiratory Rate: 20 breaths/min, Blood Pressure: 155/84 mmHg. Respiratory Normal work of breathing on room air. General Notes: 06/12/2022: The wound was initially measured larger by couple of millimeters, but upon debridement, much of this was found to be slough and eschar. The wound is actually about the same size. Integumentary (Hair, Skin) Wound #7 status is Open. Original cause of wound was Blister. The date acquired was: 01/22/2022. The wound has been in treatment 20 weeks. The wound is located on the Left,Anterior Lower Leg. The wound measures 0.5cm length x 0.5cm width x 0.1cm depth; 0.196cm^2 area and 0.02cm^3 volume. There is Fat Layer (Subcutaneous Tissue) exposed. There is no tunneling or undermining noted. There is a medium amount of serosanguineous drainage noted.  The wound margin is distinct with the outline attached to the wound base. There is large (67-100%) pink granulation within the wound bed. There is a small (1-33%) amount of necrotic tissue within the wound bed including Adherent Slough. The periwound skin appearance had no abnormalities noted for texture. The periwound skin appearance exhibited: Dry/Scaly, Maceration, Hemosiderin Staining. The periwound skin appearance did not exhibit: Erythema. Periwound temperature was noted as No Abnormality. 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 There was a Selective/Open Wound Non-Viable Tissue Debridement with a total area of 0.2 sq cm performed by [name], MD. With the following instrument(s): Curette to remove Non-Viable tissue/material. Material removed includes  Eschar and Slough and after achieving pain control using Lidocaine 4% Topical Solution. No specimens were taken. A time out was conducted at 10:17, prior to the start of the procedure. A Minimum amount of bleeding was controlled with Pressure. The procedure was tolerated well. Post Debridement Measurements: 0.5cm length x 0.5cm width x 0.1cm depth; 0.02cm^3 volume. Character of Wound/Ulcer Post Debridement is improved. Post procedure Diagnosis Wound #7: Same as Pre-Procedure General Notes: scribed for Dr. Lady Gary by Samuella Bruin, RN. Pre-procedure diagnosis of Wound #7 is a Lymphedema located on the Left,Anterior Lower Leg . There was a Double Layer Compression Therapy Procedure by Samuella Bruin, RN. Post procedure Diagnosis Wound #7: Same as Pre-Procedure Plan Follow-up Appointments: Return Appointment in 1 week. - Dr. Lady Gary Room 3 Anesthetic: (In clinic) Topical Lidocaine 4% applied to wound bed Bathing/ Shower/ Hygiene: May shower with protection but do not get wound dressing(s) wet. Protect dressing(s) with water repellant cover (for example, large plastic bag) or a cast cover HAILLY, FESS [name] ([id number]) [id number] Page 11 of 13 and may then take shower. - Please do not get the Left lower leg Compression wraps wet. Edema Control - Lymphedema / SCD / Other: Avoid standing for long periods of time. Patient to wear own compression stockings every day. Exercise regularly Moisturize legs daily. Off-Loading: Other: - Elevate feet on a pillow (behind the calf while in bed) The following medication(s) was prescribed: lidocaine topical 4 % cream cream topical was prescribed at facility WOUND #7: - Lower Leg Wound Laterality: Left, Anterior Cleanser: Soap and Water 1 x Per Week/30 Days Discharge Instructions: May shower and wash wound with dial antibacterial soap and water prior to dressing change. Cleanser: Wound Cleanser 1 x Per Week/30  Days Discharge Instructions: Cleanse the wound with wound cleanser prior to applying a clean dressing using gauze sponges, not tissue or cotton balls. Peri-Wound Care: Ketoconazole Cream 2% 1 x Per Week/30 Days Discharge Instructions: Apply Ketoconazole as directed Peri-Wound Care: Triamcinolone 15 (g) 1 x Per Week/30 Days Discharge Instructions: Use triamcinolone 15 (g) as directed Peri-Wound Care: Zinc Oxide Ointment 30g tube 1 x Per Week/30 Days Discharge Instructions: Apply Zinc Oxide to periwound with each dressing change Peri-Wound Care: Sween Lotion (Moisturizing lotion) 1 x Per Week/30 Days Discharge Instructions: Apply moisturizing lotion as directed Topical: Mupirocin Ointment 1 x Per Week/30 Days Discharge Instructions: Apply Mupirocin (Bactroban) as instructed Prim Dressing: Sorbalgon AG Dressing, 4x4 (in/in) 1 x Per Week/30 Days ary Discharge Instructions: Apply to wound bed as instructed Secondary Dressing: Woven Gauze Sponge, Non-Sterile 4x4 in 1 x Per Week/30 Days Discharge Instructions: Apply  over primary dressing as directed. Secondary Dressing: Zetuvit Plus 4x8 in 1 x Per Week/30 Days Discharge Instructions: Apply over primary dressing as directed. Com pression Wrap: FourPress (4 layer compression wrap) 1 x Per Week/30 Days Discharge Instructions: Apply four layer compression as directed. May also use Urgo K2 compression system as alternative. Com pression Wrap: Netting #5 1 x Per Week/30 Days 06/12/2022: The wound was initially measured larger by couple of millimeters, but upon debridement, much of this was found to be slough and eschar. The wound is actually about the same size. I used a curette to debride slough and eschar from the wound. We will continue topical mupirocin with silver alginate with periwound zinc oxide, TCA, and ketoconazole. Continue 4-layer compression. Follow-up in 1 week. Electronic Signature(s) Signed: 06/12/2022 10:37:27 AM By: [name] MD  FACS Entered By: [name] on 06/12/2022 10:37:27 -------------------------------------------------------------------------------- HxROS Details Patient Name: Date of Service: Erin Rosier NITA [name] 06/12/2022 9:30 A M Medical Record Number: [id number] Patient Account Number: [id number] Date of Birth/Sex: Treating RN: [birth year] (67 y.o. F) Primary Care Provider: [name] Other Clinician: Referring Provider: Treating Provider/Extender: Jasmine Pang in Treatment: 20 Information Obtained From Patient Constitutional Symptoms (General Health) Medical History: Past Medical History Notes: morbid obesity Eyes Medical History: Positive for: Cataracts Hematologic/Lymphatic Medical History: Positive for: Lymphedema Cardiovascular JEANIA, NATER ([id number]) [id number] Page 12 of 13 Medical History: Positive for: Hypertension; Peripheral Venous Disease Past Medical History Notes: schamberg disease, hyperlipidemia Gastrointestinal Medical History: Past Medical History Notes: diverticulitis , h/o obstruction due to diverticulitis , colostomy and colostomy reversal Endocrine Medical History: Past Medical History Notes: hypothyroidism Integumentary (Skin) Medical History: Negative for: History of Burn Musculoskeletal Medical History: Positive for: Osteoarthritis HBO Extended History Items Eyes: Cataracts Immunizations Pneumococcal Vaccine: Received Pneumococcal Vaccination: Yes Received Pneumococcal Vaccination On or After 60th Birthday: Yes Implantable Devices None Hospitalization / Surgery History Type of Hospitalization/Surgery colostomy reversal 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 Family and Social History Cancer: No; Diabetes: No; Heart Disease: Yes - Mother,Father; Hereditary Spherocytosis: No; Hypertension: Yes - Mother,Father; Kidney Disease: Yes - Mother; Lung Disease: No; Seizures: No; Stroke: No;  Thyroid Problems: Yes - Mother; Tuberculosis: No; Never smoker; Marital Status - Single; Alcohol Use: Never; Drug Use: No History; Caffeine Use: Daily - coffee; Financial Concerns: No; Food, Clothing or Shelter Needs: No; Support System Lacking: No; Transportation Concerns: No Electronic Signature(s) Signed: 06/12/2022 3:54:11 PM By: [name] MD FACS Entered By: [name] on 06/12/2022 10:33:35 -------------------------------------------------------------------------------- SuperBill Details Patient Name: Date of Service: Erin Philips [name] 06/12/2022 Medical Record Number: [id number] Patient Account Number: [id number] Date of Birth/Sex: Treating RN: [birth year] (67 y.o. 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 with other specif Modifier: ied severity Quantity: 1 Physician Procedures : CPT4 Code Description Modifier [id number] - WC PHYS LEVEL 4 - EST PT 25 ICD-10 Diagnosis Description L97.928 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

## 2022-06-13 NOTE — Progress Notes (Signed)
Erin [name], Erin [name] [name] ([id number]) [id number] Page 1 of 7 Visit Report for 06/12/2022 Arrival Information Details Patient Name: Date of Service: Erin [name], Erin [name] 06/12/2022 9:30 A M Medical Record Number: [id number] Patient Account Number: [id number] Date of Birth/Sex: Treating RN: [birth year] (67 y.o. [name] Lynelle Weiler: [name] Other Clinician: Referring Gerardo Caiazzo: Treating Deyona Soza/Extender: Jasmine Pang in Treatment: 20 Visit Information History Since Last Visit Added or deleted any medications: No Patient Arrived: Erin [name] Any new allergies or adverse reactions: No Arrival Time: 09:46 Had a fall or experienced change in No Accompanied By: self activities of daily living that may affect Transfer Assistance: None risk of falls: Patient Identification Verified: Yes Signs or symptoms of abuse/neglect since last visito No Secondary Verification Process Completed: Yes Hospitalized since last visit: No Patient Requires Transmission-Based Precautions: No Implantable device outside of the clinic excluding No Patient Has Alerts: No cellular tissue based products placed in the center since last visit: Has Dressing in Place as Prescribed: Yes Has Compression in Place as Prescribed: Yes Pain Present Now: No Electronic Signature(s) Signed: 06/12/2022 3:22:44 PM By: Samuella Bruin Entered By: Samuella Bruin on 06/12/2022 09:46:48 -------------------------------------------------------------------------------- Compression Therapy Details Patient Name: Date of Service: Erin [name] [name] 06/12/2022 9:30 A M Medical Record Number: [id number] Patient Account Number: [id number] Date of Birth/Sex: Treating RN: 06-Mar-1955 (67 y.o. [name] Nathifa Ritthaler: [name] Other Clinician: Referring Jalei Shibley: Treating Lyrica Mcclarty/Extender: Jasmine Pang in Treatment:  20 Compression Therapy Performed for Wound Assessment: Wound #7 Left,Anterior Lower Leg Performed By: Clinician Samuella Bruin, RN Compression Type: Double Layer Post Procedure Diagnosis Same as Pre-procedure Electronic Signature(s) Signed: 06/12/2022 3:22:44 PM By: Gelene Mink By: Samuella Bruin on 06/12/2022 09:59:51 -------------------------------------------------------------------------------- Encounter Discharge Information Details Patient Name: Date of Service: Erin [name] [name] 06/12/2022 9:30 A M Medical Record Number: [id number] Patient Account Number: [id number] Date of Birth/Sex: Treating RN: 08/27/1955 (67 y.o. [name] Aqil Goetting: [name] Other Clinician: Referring Jaevian Shean: Treating Sobia Karger/Extender: Jasmine Pang in Treatment: 20 Encounter Discharge Information Items Post Procedure Vitals Discharge Condition: Stable Temperature (F): 97.8 Ambulatory Status: Cane Pulse (bpm): 87 Discharge Destination: Home Respiratory Rate (breaths/min): 20 Transportation: Private Auto Blood Pressure (mmHg): 155/84 Erin [name], Erin [name] ([id number]) [id number] Page 2 of 7 Accompanied By: self Schedule Follow-up Appointment: Yes Clinical Summary of Care: Patient Declined Electronic Signature(s) Signed: 06/12/2022 3:22:44 PM By: Samuella Bruin Entered By: Samuella Bruin on 06/12/2022 10:01:08 -------------------------------------------------------------------------------- Lower Extremity Assessment Details Patient Name: Date of Service: Erin [name], Erin [name] 06/12/2022 9:30 A M Medical Record Number: [id number] Patient Account Number: [id number] Date of Birth/Sex: Treating RN: 09/27/1955 (67 y.o. [name] Coco Sharpnack: [name] Other Clinician: Referring Lenin Kuhnle: Treating Averie Meiner/Extender: Jasmine Pang in Treatment:  20 Edema Assessment Assessed: [Left: No] [Right: No] [Left: Edema] [Right: :] Calf Left: Right: Point of Measurement: From Medial Instep 41 cm Ankle Left: Right: Point of Measurement: From Medial Instep 21.5 cm Vascular Assessment Pulses: Dorsalis Pedis Palpable: [Left:Yes] Electronic Signature(s) Signed: 06/12/2022 3:22:44 PM By: Samuella Bruin Entered By: Samuella Bruin on 06/12/2022 09:51:39 -------------------------------------------------------------------------------- Multi Wound Chart Details Patient Name: Date of Service: Erin [name] [name] 06/12/2022 9:30 A M Medical Record Number: [id number] Patient Account Number: [id number] Date of Birth/Sex: Treating RN: 02-17-1955 (67 y.o. F) Primary Care Dawit Tankard: [name] Other Clinician: Referring Auria Mckinlay: Treating Sundeep Cary/Extender: Jasmine Pang in Treatment: 20 Vital Signs Height(in): 62 Pulse(bpm): 87  Weight(lbs): 222 Blood Pressure(mmHg): 155/84 Body Mass Index(BMI): 40.6 Temperature(F): 97.8 Respiratory Rate(breaths/min): 20 [7:Photos:] [N/A:N/A] Left, Anterior Lower Leg N/A N/A Wound Location: Blister N/A N/A Wounding Event: Lymphedema N/A N/A Primary Etiology: Cataracts, Lymphedema, N/A N/A Comorbid History: Hypertension, Peripheral Venous Disease, Osteoarthritis 01/22/2022 N/A N/A Date Acquired: 20 N/A N/A Weeks of Treatment: Open N/A N/A Wound Status: No N/A N/A Wound Recurrence: 0.5x0.5x0.1 N/A N/A Measurements L x W x [name] 0.196 N/A N/A A (cm) : rea 0.02 N/A N/A Volume (cm) : 93.70% N/A N/A % Reduction in A rea: 96.80% N/A N/A % Reduction in Volume: Full Thickness Without Exposed N/A N/A Classification: Support Structures Medium N/A N/A Exudate A mount: Serosanguineous N/A N/A Exudate Type: red, brown N/A N/A Exudate Color: Distinct, outline attached N/A N/A Wound Margin: Large (67-100%) N/A N/A Granulation A mount: Pink N/A  N/A Granulation Quality: Small (1-33%) N/A N/A Necrotic A mount: Fat Layer (Subcutaneous Tissue): Yes N/A N/A Exposed Structures: Fascia: No Tendon: No Muscle: No Joint: No Bone: No Medium (34-66%) N/A N/A Epithelialization: Debridement - Selective/Open Wound N/A N/A Debridement: Pre-procedure Verification/Time Out 10:17 N/A N/A Taken: Lidocaine 4% Topical Solution N/A N/A Pain Control: Necrotic/Eschar, Slough N/A N/A Tissue Debrided: Non-Viable Tissue N/A N/A Level: 0.2 N/A N/A Debridement A (sq cm): rea Curette N/A N/A Instrument: Minimum N/A N/A Bleeding: Pressure N/A N/A Hemostasis A chieved: Procedure was tolerated well N/A N/A Debridement Treatment Response: 0.5x0.5x0.1 N/A N/A Post Debridement Measurements L x W x [name] 0.02 N/A N/A Post Debridement Volume: (cm) No Abnormalities Noted N/A N/A Periwound Skin Texture: Maceration: Yes N/A N/A Periwound Skin Moisture: Dry/Scaly: Yes Hemosiderin Staining: Yes N/A N/A Periwound Skin Color: Erythema: No No Abnormality N/A N/A Temperature: Compression Therapy N/A N/A Procedures Performed: Debridement Treatment Notes Wound #7 (Lower Leg) Wound Laterality: Left, Anterior Cleanser Soap and Water Discharge Instruction: May shower and wash wound with dial antibacterial soap and water prior to dressing change. Wound Cleanser Discharge Instruction: Cleanse the wound with wound cleanser prior to applying a clean dressing using gauze sponges, not tissue or cotton balls. Peri-Wound Care Ketoconazole Cream 2% Discharge Instruction: Apply Ketoconazole as directed Triamcinolone 15 (g) Discharge Instruction: Use triamcinolone 15 (g) as directed Zinc Oxide Ointment 30g tube Discharge Instruction: Apply Zinc Oxide to periwound with each dressing change Sween Lotion (Moisturizing lotion) Discharge Instruction: Apply moisturizing lotion as directed Topical Mupirocin Ointment Discharge Instruction: Apply Mupirocin  (Bactroban) as instructed Erin [name], Erin [name] ([id number]) [id number] Page 4 of 7 Primary Dressing Sorbalgon AG Dressing, 4x4 (in/in) Discharge Instruction: Apply to wound bed as instructed Secondary Dressing Woven Gauze Sponge, Non-Sterile 4x4 in Discharge Instruction: Apply over primary dressing as directed. Zetuvit Plus 4x8 in Discharge Instruction: Apply over primary dressing as directed. Secured With Compression Wrap FourPress (4 layer compression wrap) Discharge Instruction: Apply four layer compression as directed. May also use Urgo K2 compression system as alternative. Netting #5 Compression Stockings Add-Ons Electronic Signature(s) Signed: 06/12/2022 10:22:46 AM By: [name] MD FACS Entered By: [name] on 06/12/2022 10:22:46 -------------------------------------------------------------------------------- Multi-Disciplinary Care Plan Details Patient Name: Date of Service: Erin [name] [name] 06/12/2022 9:30 A M Medical Record Number: [id number] Patient Account Number: [id number] Date of Birth/Sex: Treating RN: 07-Jan-1956 (67 y.o. [name] Maeghan Canny: [name] Other Clinician: Referring Terilynn Buresh: Treating Hanzel Pizzo/Extender: Jasmine Pang in Treatment: 20 Active Inactive Abuse / Safety / Falls / Self Care Management Nursing Diagnoses: History of Falls Impaired physical mobility Goals: Patient/caregiver will identify factors that  restrict self-care and home management Date Initiated: 01/22/2022 Target Resolution Date: 09/11/2022 Goal Status: Active Interventions: Assess fall risk on admission and as needed Assess: immobility, friction, shearing, incontinence upon admission and as needed Notes: Wound/Skin Impairment Nursing Diagnoses: Impaired tissue integrity Knowledge deficit related to ulceration/compromised skin integrity Goals: Patient/caregiver will verbalize  understanding of skin care regimen Date Initiated: 01/22/2022 Target Resolution Date: 09/11/2022 Goal Status: Active Interventions: Assess ulceration(s) every visit Treatment Activities: Skin care regimen initiated : 01/22/2022 Topical wound management initiated : 01/22/2022 Erin [name], Erin [name] ([id number]) [phone].pdf Page 5 of 7 Notes: Electronic Signature(s) Signed: 06/12/2022 3:22:44 PM By: Gelene Mink By: Samuella Bruin on 06/12/2022 09:58:34 -------------------------------------------------------------------------------- Pain Assessment Details Patient Name: Date of Service: Erin [name], Erin [name] 06/12/2022 9:30 A M Medical Record Number: [id number] Patient Account Number: [id number] Date of Birth/Sex: Treating RN: [birth year] (67 y.o. [name] Elysia Grand: [name] Other Clinician: Referring Brandom Kerwin: Treating Norlene Lanes/Extender: Jasmine Pang in Treatment: 20 Active Problems Location of Pain Severity and Description of Pain Patient Has Paino No Site Locations Rate the pain. Current Pain Level: 0 Pain Management and Medication Current Pain Management: Electronic Signature(s) Signed: 06/12/2022 3:22:44 PM By: Samuella Bruin Entered By: Samuella Bruin on 06/12/2022 09:48:16 -------------------------------------------------------------------------------- Patient/Caregiver Education Details Patient Name: Date of Service: Erin [name] 4/30/2024andnbsp9:30 A M Medical Record Number: [id number] Patient Account Number: [id number] Date of Birth/Gender: Treating RN: 04-27-1955 (67 y.o. [name] Physician: [name] Other Clinician: Referring Physician: Treating Physician/Extender: Jasmine Pang in Treatment: 20 Education Assessment Education Provided To: Patient Education Topics Provided Wound/Skin  Impairment: Methods: Explain/Verbal Responses: Reinforcements needed, State content correctly Erin [name], Erin [name] ([id number]) [id number] Page 6 of 7 Electronic Signature(s) Signed: 06/12/2022 3:22:44 PM By: Gelene Mink By: Samuella Bruin on 06/12/2022 09:58:47 -------------------------------------------------------------------------------- Wound Assessment Details Patient Name: Date of Service: Erin [name], Erin [name] 06/12/2022 9:30 A M Medical Record Number: [id number] Patient Account Number: [id number] Date of Birth/Sex: Treating RN: 19-May-1955 (67 y.o. [name] Briar Witherspoon: [name] Other Clinician: Referring Sansa Alkema: Treating Rayanna Matusik/Extender: Jasmine Pang in Treatment: 20 Wound Status Wound Number: 7 Primary Lymphedema Etiology: Wound Location: Left, Anterior Lower Leg Wound Open Wounding Event: Blister Status: Date Acquired: 01/22/2022 Comorbid Cataracts, Lymphedema, Hypertension, Peripheral Venous Weeks Of Treatment: 20 History: Disease, Osteoarthritis Clustered Wound: No Photos Wound Measurements Length: (cm) 0.5 Width: (cm) 0.5 Depth: (cm) 0.1 Area: (cm) 0.196 Volume: (cm) 0.02 % Reduction in Area: 93.7% % Reduction in Volume: 96.8% Epithelialization: Medium (34-66%) Tunneling: No Undermining: No Wound Description Classification: Full Thickness Without Exposed Suppor Wound Margin: Distinct, outline attached Exudate Amount: Medium Exudate Type: Serosanguineous Exudate Color: red, brown t Structures Foul Odor After Cleansing: No Slough/Fibrino Yes Wound Bed Granulation Amount: Large (67-100%) Exposed Structure Granulation Quality: Pink Fascia Exposed: No Necrotic Amount: Small (1-33%) Fat Layer (Subcutaneous Tissue) Exposed: Yes Necrotic Quality: Adherent Slough Tendon Exposed: No Muscle Exposed: No Joint Exposed: No Bone Exposed: No Periwound Skin  Texture Texture Color No Abnormalities Noted: Yes No Abnormalities Noted: No Erythema: No Moisture Hemosiderin Staining: Yes No Abnormalities Noted: No Dry / Scaly: Yes Temperature / Pain Maceration: Yes Temperature: No Abnormality Treatment Notes Erin [name], Erin [name] ([id number]) [id number] Page 7 of 7 Wound #7 (Lower Leg) Wound Laterality: Left, Anterior Cleanser Soap and Water Discharge Instruction: May shower and wash wound with dial antibacterial soap and water prior to dressing change. Wound Cleanser Discharge Instruction: Cleanse the wound with wound  cleanser prior to applying a clean dressing using gauze sponges, not tissue or cotton balls. Peri-Wound Care Ketoconazole Cream 2% Discharge Instruction: Apply Ketoconazole as directed Triamcinolone 15 (g) Discharge Instruction: Use triamcinolone 15 (g) as directed Zinc Oxide Ointment 30g tube Discharge Instruction: Apply Zinc Oxide to periwound with each dressing change Sween Lotion (Moisturizing lotion) Discharge Instruction: Apply moisturizing lotion as directed Topical Mupirocin Ointment Discharge Instruction: Apply Mupirocin (Bactroban) as instructed Primary Dressing Sorbalgon AG Dressing, 4x4 (in/in) Discharge Instruction: Apply to wound bed as instructed Secondary Dressing Woven Gauze Sponge, Non-Sterile 4x4 in Discharge Instruction: Apply over primary dressing as directed. Zetuvit Plus 4x8 in Discharge Instruction: Apply over primary dressing as directed. Secured With Compression Wrap FourPress (4 layer compression wrap) Discharge Instruction: Apply four layer compression as directed. May also use Urgo K2 compression system as alternative. Netting #5 Compression Stockings Add-Ons Electronic Signature(s) Signed: 06/12/2022 3:22:44 PM By: Samuella Bruin Entered By: Samuella Bruin on 06/12/2022  09:56:31 -------------------------------------------------------------------------------- Vitals Details Patient Name: Date of Service: Erin Rosier NITA [name] 06/12/2022 9:30 A M Medical Record Number: [id number] Patient Account Number: [id number] Date of Birth/Sex: Treating RN: 1955-11-02 (67 y.o. [name] Lewin Pellow: [name] Other Clinician: Referring Joron Velis: Treating Tannia Contino/Extender: Jasmine Pang in Treatment: 20 Vital Signs Time Taken: 09:47 Temperature (F): 97.8 Height (in): 62 Pulse (bpm): 87 Weight (lbs): 222 Respiratory Rate (breaths/min): 20 Body Mass Index (BMI): 40.6 Blood Pressure (mmHg): 155/84 Reference Range: 80 - 120 mg / dl Electronic Signature(s) Signed: 06/12/2022 3:22:44 PM By: Samuella Bruin Entered By: Samuella Bruin on 06/12/2022 09:48:10

## 2022-06-15 ENCOUNTER — Other Ambulatory Visit (HOSPITAL_COMMUNITY): Payer: Self-pay

## 2022-06-18 ENCOUNTER — Encounter (HOSPITAL_BASED_OUTPATIENT_CLINIC_OR_DEPARTMENT_OTHER): Payer: Commercial Managed Care - PPO | Attending: General Surgery | Admitting: General Surgery

## 2022-06-18 DIAGNOSIS — I87312 Chronic venous hypertension (idiopathic) with ulcer of left lower extremity: Secondary | ICD-10-CM | POA: Diagnosis not present

## 2022-06-18 DIAGNOSIS — I890 Lymphedema, not elsewhere classified: Secondary | ICD-10-CM | POA: Diagnosis not present

## 2022-06-18 DIAGNOSIS — L97822 Non-pressure chronic ulcer of other part of left lower leg with fat layer exposed: Secondary | ICD-10-CM | POA: Diagnosis not present

## 2022-06-18 DIAGNOSIS — L97928 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: Secondary | ICD-10-CM | POA: Insufficient documentation

## 2022-06-19 ENCOUNTER — Encounter: Payer: Self-pay | Admitting: Internal Medicine

## 2022-06-19 ENCOUNTER — Ambulatory Visit: Payer: Commercial Managed Care - PPO | Admitting: Internal Medicine

## 2022-06-19 ENCOUNTER — Ambulatory Visit: Payer: Commercial Managed Care - PPO | Attending: Internal Medicine | Admitting: Internal Medicine

## 2022-06-19 VITALS — BP 132/74 | HR 88 | Ht 62.0 in | Wt 228.2 lb

## 2022-06-19 DIAGNOSIS — R011 Cardiac murmur, unspecified: Secondary | ICD-10-CM

## 2022-06-19 NOTE — Progress Notes (Signed)
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 lower leg Compression wraps wet. Edema Control - Lymphedema / SCD / Other Avoid standing for long periods of time. Patient to wear own compression stockings every day. Exercise regularly Moisturize legs daily. Off-Loading Other: - Elevate feet on a pillow (behind the calf while in bed). Elevate feet at heart level or above heart level. Wound Treatment Wound #7 - Lower Leg Wound Laterality: Left, Anterior Cleanser: Soap and Water 1 x Per Week/30 Days Discharge Instructions: May shower and wash wound with dial antibacterial soap and water prior to dressing change. Cleanser: Wound Cleanser 1 x Per Week/30 Days Discharge Instructions: Cleanse the wound with wound cleanser prior to applying a clean dressing using gauze sponges, not tissue or cotton balls. Peri-Wound Care: Ketoconazole Cream 2% 1 x Per Week/30 Days Discharge Instructions: Apply Ketoconazole as directed Peri-Wound Care: Triamcinolone 15 (g) 1 x Per Week/30 Days Discharge Instructions: Use triamcinolone 15 (g) as directed Peri-Wound Care: Zinc Oxide  Ointment 30g tube 1 x Per Week/30 Days Discharge Instructions: Apply Zinc Oxide to periwound with each dressing change Peri-Wound Care: Sween Lotion (Moisturizing lotion) 1 x Per Week/30 Days MINDIE, HERLONG ([id number]) [id number] Page 6 of 13 Discharge Instructions: Apply moisturizing lotion as directed Topical: Mupirocin Ointment 1 x Per Week/30 Days Discharge Instructions: Apply Mupirocin (Bactroban) as instructed Prim Dressing: Maxorb Extra Ag+ Alginate Dressing, 2x2 (in/in) 1 x Per Week/30 Days ary Discharge Instructions: Apply to wound bed as instructed Secondary Dressing: Woven Gauze Sponge, Non-Sterile 4x4 in 1 x Per Week/30 Days Discharge Instructions: Apply over primary dressing as directed. Secondary Dressing: Zetuvit Plus 4x8 in 1 x Per Week/30 Days Discharge Instructions: Apply over primary dressing as directed. Compression Wrap: FourPress (4 layer compression wrap) 1 x Per Week/30 Days Discharge Instructions: Apply four layer compression as directed. May also use Urgo K2 compression system as alternative. Compression Wrap: Netting #5 1 x Per Week/30 Days Electronic Signature(s) Signed: 06/18/2022 11:24:09 AM By: [name] MD FACS Entered By: [name] on 06/18/2022 10:47:35 -------------------------------------------------------------------------------- Problem List Details Patient Name: Date of Service: Erin Philips D. 06/18/2022 9:45 A M Medical Record Number: [id number] Patient Account Number: [id number] Date of Birth/Sex: Treating RN: November 09, 1955 (67 y.o. 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 06/18/2022 9:45 A M Medical Record Number: [id number] Patient Account Number: [id number] Date of Birth/Sex: Treating RN: 06-21-1955 (67 y.o. F) Primary Care Provider: [name] Other Clinician: Referring Provider: Treating Provider/Extender: Jasmine Pang in Treatment: [address], [place] D ([id number]) [id number] Page 7 of 13 Chief Complaint Information obtained from Patient 04/19/2021: The patient is here for ongoing follow-up regarding 2 left lower extremity wounds. 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 She tells [name] that she fell on the gravel driveway in July. She developed injuries on her distal lower leg which have not healed. She saw her primary physician on 11/15/2017 who noted her left shin injuries. Gave her antibiotics. At that point the wounds were almost circumferential however most were less than 1.5 cm. Weeping edema fluid was noted. She was referred here for evaluation. The patient has a history of chronic lower extremity edema. 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 She has had prior history with leg weeping fluid. She does not wear compression stockings. She is not doing anything specific to  these wound areas. 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 She also has lymphedema. Put her in 3 layer compression and silver alginate last week. She has 3 small wounds with depth just lateral to the tibia. More problematically than this she has numerous shallow areas some of which are almost canal like in shape with tightly adherent painful debris. It would be very difficult and time- consuming to go through this and attempt to individually debride all these areas.. I changed her to collagen today to see if that would help with any of the surface debris on some of these wounds. Otherwise we will not be able to put this in compression we have to have someone change the dressing. The patient is not eligible for home health 12/25/17 on evaluation today patient actually appears to be doing rather well in regard to the ulcer on her lower extremity. Fortunately there does not appear to be evidence of infection at this time. She has been tolerating the dressing changes without complication. This includes the compression wrap. The only issue she had was that the wrap was initially placed over her bunion region which actually calls her some discomfort and pain. Other than that things seem to be going rather well. 01/01/2018 Seen today for follow-up and management of left lower extremity wound and lymphedema. T oday she presents with a new wound towards to the left lateral LE. Recently treated  with a 7 day course of amoxicillin; reason for antibiotic dose is unknown at this time. Tolerating current treatment of collagen with 4- layer wraps. She obtained a venous reflux study on 12/25/17. Studies show on the right abnormal reflux  times of the popliteal vein, great saphenous vein at the saphenofemoral junction at the proximal thigh, great saphenous vein at the mid calf, and origin of the small saphenous vein.No superficial thrombosis. No deep vein thrombosis in the common femoral, femoral,and popliteal veins. Left abnormal reflex times as well of the common femoral vein, popliteal vein, and a great saphenous vein at the saphenofemoral junction, In great saphenous vein at the mid thigh w/o thrombosis. Has any issues or concerns during visit today. Recommended follow-up to vascular specialist due to abnormalities from the venous reflux study. Denies fever, pain, chills, dizziness, nausea, or vomiting. 01/08/18 upon evaluation today the patient actually seems to be showing some signs of improvement in my opinion at this point in regard to the lower extremity ulcerated areas. She still has a lot of drainage but fortunately nothing that appears to be too significant currently. I have been very happy with the overall progress I see today compared to where things were during the last evaluation that I had with her. Nonetheless she has not had her appointment with the vein specialist as of yet in fact we were able to get this approved for her today and confirmed with them she will be seeing them on December 26. Nonetheless in general I do feel like the compression wraps is doing well for her. 01/17/18; quite a bit of improvement since last time I saw this patient she has a small open area remaining on the left lateral calf and even smaller area medially. She has lymphedema chronic stasis changes with distal skin fibrosis. She has an appointment with vascular surgery later this month 01/24/2018; the patient's medial leg has closed. Still a small open area on the left lateral leg. She states that the 4 layer compression we put on last week was too tight and she had to take it off over a few days ago. We have had resultant increase in her  lymphedema in the dorsal foot and a proximal calf. Fortunately that does not seem to have resulted in any deterioration in her wounds The patient is going to need compression stockings. We have given her measurements to phone elastic therapy in [place]. She has vascular surgery consult on December 26 02/07/18; she's had continuous contraction on the left lateral leg wound which is now very small. Currently using silver alginate under 3 layer compression She saw Dr. Myra Gianotti of vascular surgery on 02/06/18. It was noted that she had a normal reflux times in the popliteal vein, great saphenous vein at the saphenofemoral junction, great saphenous vein at the proximal thigh great saphenous vein at the mid calf and origin of the small saphenous vein. It was noted that she had significant reflux in the left saphenous veins with diameter measurements in the 0.7-0.8 cm range. It was felt she would benefit from laser ablation to help minimize the risk of ulcer recurrence. It was recommended that she wear 20-30 thigh-high compression stockings and have follow-up in 4-6 weeks 02/17/2018; I thought this lady would be healed however her compression slipped down and she developed increasing swelling and the wound is actually larger. 1/13; we had deterioration last week after the patient's compression slipped down and she developed periwound swelling. I increased her compression before layers we have  been using silver alginate we are a lot better again today. She has her compression stockings in waiting 1/23; the patient's wounds are totally healed today. She has her stockings. This was almost circumferential skin damage. She follows up with Dr. Myra Gianotti of vascular surgery next Monday. READMISSION 02/21/2021 This is a now 67 year old woman that we had in clinic here discharging in January 2020 with wounds on her left calf chronic venous insufficiency. She was discharged with 30/40 stockings. It does not sound like  she has worn stockings in about a year largely from not being able to get them on herself. In November she developed new blisters on her legs an area laterally is opened into a fairly sizable wound. She has weeping posteriorly as well. She has been using Neosporin and Band-Aids. The patient did see Dr. Myra Gianotti in 2019 and 2020. He felt she might benefit from laser ablation in her left saphenous veins although because of the wound that healed I do not think he went through with it. She might benefit from seeing him again. Her ABI on the left is 1. 1/17; patient's wound on the posterior left calf is closed she has the 2 large areas last week. We put her in 4-layer compression for edema control is a lot better. Our intake nurse noted greenish drainage and odor. We have been using silver alginate 1/25; PCR culture I did have the substantial wound area on the left lateral lower leg showed staff aureus and group A strep. Low titers of coag negative staph which are probably skin contaminants. Resistance detected to tetracycline methicillin and macrolides. I gave her a starter kit of Nuzyra 150 mg x 3 for 2 days then 300 mg for a further 7 days. Marked odor considerable increase in surrounding erythema. We are using Iodoflex last week however I have changed to silver alginate with underlying Bactroban 2/1; she is completing her [place] tomorrow. The degree of erythema around the wounds looks a lot better. Odor has improved. I gave her silver alginate and Bactroban last week and changing her back to Iodoflex to continue with ongoing debridement SHEINDY, KLIPP ([id number]) [id number].pdf Page 8 of 13 2/8; the periwound looks a lot better. Surface of the wound also looks somewhat better although there is still ongoing debridement to be done we have been using Iodoflex under compression. Primary dressing and silver alginate 2/15; left posterior calf. Some improvement in the surface of  the wound but still very gritty we have been using Iodoflex under compression. 04/05/2021: Left lateral posterior calf. She continues to have significant amounts of drainage, some of which is probably comprised of the Iodoflex microbleeds. There is no odor to the drainage. The satellite lesion on the more posterior aspect of the calf is epithelializing nicely and has contracted quite a bit. There is robust granulation tissue in the dominant wound with minimal adherent slough. 04/12/2021: The satellite lesion has nearly closed. She continues to have good granulation tissue with minimal slough of the larger primary wound. She continues to have a fair amount of drainage, but I think this is less secondary to switching her dressing from Iodoflex to Prisma. 04/19/2021: The satellite lesion is almost completely epithelialized. The larger primary wound continues to contract with good granulation tissue and minimal slough. She is currently in [place] with compression. 04/26/2021: The satellite lesion has closed. The larger primary wound has contracted further and the granulation tissue is robust without being hypertrophic. Minimal slough is present. 05/03/2021: The satellite lesion remains  closed. The larger primary wound has a bit of slough, but has also contracted further with good perimeter epithelialization. Good granulation tissue at the wound surface. There was some greenish drainage on the dressing when it was removed; it is not the blue-green typically associated with Pseudomonas aeruginosa, however. 05/10/2021: The primary wound continues to contract. There is minimal slough. The granulation tissue at 9:00 is a bit hypertrophic. No significant drainage. No odor. 05/17/2021: The wound is a little bit smaller today with good granulation tissue. Minimal slough. No significant drainage or odor. 05/24/2021: The wound continues to contract and has a nice base of granulation tissue. Small amount of slough. No concern  for infection. 05/31/2021: For some reason, the wound measured slightly larger today but overall it still appears to be in good condition with a nice base of granulation tissue and minimal slough. 06/07/2021: The wound is smaller today. Good granulation tissue and minimal slough. 06/14/2021: The wound is unchanged in size. She continues to accumulate some slough. Good granulation tissue on the surface. 06/20/2021: The wound is smaller today. Minimal slough with good granulation tissue. 06/27/2021: The wound on her left lateral leg is smaller today with just a bit of slough and eschar accumulation. Unfortunately, she has opened a new superficial wound on her right lower extremity. She has 3+ pitting edema to the knees on that leg and she does not wear compression stockings. 07/04/2021: In addition to the superficial wound on her right lower extremity that she opened up last week, she has opened 3 additional sites on the same leg. Her compression wraps were clearly not in correct position when she came to clinic today as they were at about the mid calf level rather than to the tibial tuberosity. She says that they slipped earlier and she had to come back on Friday to have them redone. The wound on her left lateral leg is perhaps slightly larger with some slough accumulation. 07/11/2021: The superficial wounds on her right lower extremity are nearly closed. They just have a thin layer of eschar overlying them. The wound on her left lateral leg is a little bit shallower and has a bit of slough accumulation. 07/17/2021: The right medial lower extremity leg wounds have almost completely closed; one of them just has a tiny opening with a little bit of serous drainage. The left lateral leg wound is really unchanged. It seems to be stalled. 07/24/2021: The right leg wounds are completely closed. The left lateral leg wound is actually bigger today. It is a bit more tender. There is a minor accumulation of slough on the  surface. 07/31/2021: The culture that I took last week was positive for MRSA. We added mupirocin under the silver alginate and I prescribed doxycycline. She has been tolerating this well. The wound is smaller today but does have slough accumulation. No significant pain or drainage. 08/07/2021: She completed her course of doxycycline. The wound looks much better today. It is smaller and the periwound is less inflamed. She does have some slough accumulation on the surface. 08/14/2021: The wound continues to contract. There is slough on the wound surface, but the periwound is intact without inflammation or induration. 08/21/2021: The wound is down to just 2 small open sites. They both have a bit of slough accumulation. 08/28/2021: The wound is down to just 1 small open site with a little bit of slough and eschar accumulation. 09/04/2021: The wound continues to contract but remains open. It is clean without any slough. 09/12/2021: No  real change in the overall wound dimensions, but it is flush with the surrounding skin. There is a little bit of slough accumulation. Edema control is good. 09/22/2021: The wound is smaller and even more superficial. Minimal slough accumulation. Good control of edema. 09/29/2021: The wound continues to contract. She reports that it was a little bit "twingey" during the week. It is a little bit dry on inspection today. Light accumulation of slough. Good edema control. 10/06/2021: The wound is about the same size today. There is a little slough on the surface. Edema control is good. 10/13/2021: The wound is about the same size but more superficial. A little bit of slough on the surface. 10/23/2021: The wound is slightly smaller and continues to fill in. Minimal slough on the wound surface. 10/30/2021: The wound continues to contract but has not yet closed. Light slough and eschar present. 11/06/2021: The wound persists, but seems a little bit more epithelialized. There is still slough and  eschar accumulation. 11/14/2021: The wound continues to contract but persists. Light eschar around the wound. 11/22/2021: The wound is down to just a pinhole opening. There is eschar overlying the surface. Edema control is excellent. 11/29/2021: Her wound is closed. NAKIYA, UTTERBACK ([id number]) [id number] Page 9 of 13 READMISSION 01/22/2022 This is a patient to be discharged in October. She has severe chronic venous insufficiency at that time she had a wound on her left lower leg posteriorly also the right leg at some point. These closed. We discharged her in 30/40 mm stockings from elastic therapy which she has been wearing religiously. She tells [name] that she was traumatized by a dolly while shopping at [employer] about 2 to 3 weeks ago. She has been left with a left anterior lower leg wound. She comes in in another pair of stockings other than her 3040s. She does not have an arterial issue with her last ABI in the left at 1.2 12/18; this is a patient who has chronic venous insufficiency and recurrent venous insufficiency ulcers. She has a area on her left anterior lower leg and we readmitted her to the clinic last week. We use silver alginate and 4-layer compression. ABI was 1.2 She brought in her stocking which was a 20/30 below-knee stocking from elastic therapy. She may require a 30/40 mm equivalent stockings after this wound heals. 02/06/2022: The intake nurse reported an odor coming from the wound when she first unwrapped it but this abated after the leg was washed. There is thick layer of slough on the surface. 02/13/2022: The wound is cleaner today. It measured larger, but on visual inspection, I think some crust of Iodoflex was included in the wound measurements; the actual wound itself looks about the same to slightly smaller. Edema control is good. 02/20/2022: The wound is cleaner again today. It is also measuring a little bit smaller, but there has been some moisture  related periwound breakdown. Edema control is good. 02/27/2022: The intake nurse reported that the wound measures larger, but some of this ended up being crusted on Iodoflex. There is still some periwound moisture. Still with thick slough accumulation. Edema control is good. 03/06/2022: The wound is cleaner and more superficial, but did measure larger because of the inclusion of a satellite area. Still with some slough accumulation but less so than on prior visits. 03/13/2022: The wound measured a little bit smaller today. There is slough and eschar accumulation, as per usual. 03/20/2022: Her wound is larger and deeper today. The entire surface appears nonviable.  There is more periwound erythema and threatened tissue breakdown. 03/27/2022: The culture that I took last week was positive for MRSA. We had empirically applied topical mupirocin to her wound. Today the wound is smaller and cleaner and less painful. There is still a layer of slough on the surface. 04/03/2022: The wound was measured slightly larger today, but appears roughly the same to my eye. There is a layer of slough on the surface. Underneath, the tissue is quite fibrotic. 04/10/2022: The wound is slightly smaller today. There is slough and eschar accumulation. For some reason, her wrap got changed from 4 layers to 3 layers and her edema control is not as good, as a result. 04/17/2022: The return to true 4-layer compression has improved the wound considerably. It is much smaller and cleaner today. Edema control is excellent. 04/24/2022: Her wound continues to improve. It is smaller with just a bit of slough on the surface. Edema control is excellent. 05/08/2022: Her wound is smaller again today. There is still slough accumulation on the surface. Edema control remains excellent. 05/15/2022: The wound is substantially smaller this week. There is slough on the surface with excellent edema control. 05/22/2022: Her wound is smaller again this week.  Minimal slough on the surface. Good edema control. 05/29/2022: Once again, her wound is smaller. There is a bit of slough accumulation. Edema control remains excellent. 06/05/2022: Her wound is smaller again by about half. There is some slough and eschar buildup. Edema control is consistently excellent. 06/12/2022: The wound was initially measured larger by couple of millimeters, but upon debridement, much of this was found to be slough and eschar. The wound is actually about the same size. 06/18/2022: The wound is down to just a couple of millimeters, covered by eschar. Patient History Information obtained from Patient. Family History Heart Disease - Mother,Father, Hypertension - Mother,Father, Kidney Disease - Mother, Thyroid Problems - Mother, No family history of Cancer, Diabetes, Hereditary Spherocytosis, Lung Disease, Seizures, Stroke, Tuberculosis. Social History Never smoker, Marital Status - Single, Alcohol Use - Never, Drug Use - No History, Caffeine Use - Daily - coffee. Medical History Eyes Patient has history of Cataracts Hematologic/Lymphatic Patient has history of Lymphedema Cardiovascular Patient has history of Hypertension, Peripheral Venous Disease Integumentary (Skin) Denies history of History of Burn Musculoskeletal Patient has history of Osteoarthritis Hospitalization/Surgery History - colostomy reversal. 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 Medical A Surgical History Notes nd Constitutional Symptoms (General Health) morbid obesity Cardiovascular schamberg disease, hyperlipidemia Gastrointestinal DANIALLE, ALBERS ([id number]) [id number] Page 10 of 13 diverticulitis , h/o obstruction due to diverticulitis , colostomy and colostomy reversal Endocrine hypothyroidism Objective Constitutional no acute distress. Vitals Time Taken: 9:59 AM, Height: 62 in, Weight: 222 lbs, BMI: 40.6, Temperature: 98 F, Pulse: 85 bpm, Respiratory Rate: 20  breaths/min, Blood Pressure: 141/84 mmHg. Respiratory Normal work of breathing on room air. General Notes: 06/18/2022: The wound is down to just a couple of millimeters, covered by eschar. Integumentary (Hair, Skin) Wound #7 status is Open. Original cause of wound was Blister. The date acquired was: 01/22/2022. The wound has been in treatment 21 weeks. The wound is located on the Left,Anterior Lower Leg. The wound measures 0.2cm length x 0.2cm width x 0.1cm depth; 0.031cm^2 area and 0.003cm^3 volume. There is Fat Layer (Subcutaneous Tissue) exposed. There is no tunneling or undermining noted. There is a medium amount of serosanguineous drainage noted. The wound margin is distinct with the outline attached to the wound base. There is small (1-33%) pink granulation within  the wound bed. There is a large (67-100%) amount of necrotic tissue within the wound bed including Eschar and Adherent Slough. The periwound skin appearance had no abnormalities noted for texture. The periwound skin appearance exhibited: Dry/Scaly, Maceration, Hemosiderin Staining. The periwound skin appearance did not exhibit: Erythema. Periwound temperature was noted as No Abnormality. 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 There was a Selective/Open Wound Non-Viable Tissue Debridement with a total area of 0.03 sq cm performed by [name], MD. With the following instrument(s): Curette to remove Non-Viable tissue/material. Material removed includes Eschar after achieving pain control using Lidocaine 4% Topical Solution. No specimens were taken. A time out was conducted at 10:32, prior to the start of the procedure. A Minimum amount of bleeding was  controlled with Pressure. The procedure was tolerated well with a pain level of 0 throughout and a pain level of 0 following the procedure. Post Debridement Measurements: 0.2cm length x 0.2cm width x 0.1cm depth; 0.003cm^3 volume. Character of Wound/Ulcer Post Debridement is improved. Post procedure Diagnosis Wound #7: Same as Pre-Procedure General Notes: Scribed for Dr. Lady Gary by J.[name]. Pre-procedure diagnosis of Wound #7 is a Lymphedema located on the Left,Anterior Lower Leg . There was a Four Layer Compression Therapy Procedure by Karie Schwalbe, RN. Post procedure Diagnosis Wound #7: Same as Pre-Procedure Plan Follow-up Appointments: Return Appointment in 1 week. - Dr. Lady Gary Room 3 Anesthetic: (In clinic) Topical Lidocaine 4% applied to wound bed Bathing/ Shower/ Hygiene: May shower with protection but do not get wound dressing(s) wet. Protect dressing(s) with water repellant cover (for example, large plastic bag) or a cast cover and may then take shower. - Please do not get the Left lower leg Compression wraps wet. Edema Control - Lymphedema / SCD / Other: Avoid standing for long periods of time. Patient to wear own compression stockings every day. JYASIA, VANDERSTELT ([id number]) [id number] Page 11 of 13 Exercise regularly Moisturize legs daily. Off-Loading: Other: - Elevate feet on a pillow (behind the calf while in bed). Elevate feet at heart level or above heart level. WOUND #7: - Lower Leg Wound Laterality: Left, Anterior Cleanser: Soap and Water 1 x Per Week/30 Days Discharge Instructions: May shower and wash wound with dial antibacterial soap and water prior to dressing change. Cleanser: Wound Cleanser 1 x Per Week/30 Days Discharge Instructions: Cleanse the wound with wound cleanser prior to applying a clean dressing using gauze sponges, not tissue or cotton balls. Peri-Wound Care: Ketoconazole Cream 2% 1 x Per Week/30 Days Discharge  Instructions: Apply Ketoconazole as directed Peri-Wound Care: Triamcinolone 15 (g) 1 x Per Week/30 Days Discharge Instructions: Use triamcinolone 15 (g) as directed Peri-Wound Care: Zinc Oxide Ointment 30g tube 1 x Per Week/30 Days Discharge Instructions: Apply Zinc Oxide to periwound with each dressing change Peri-Wound Care: Sween Lotion (Moisturizing lotion) 1 x Per Week/30 Days Discharge Instructions: Apply moisturizing lotion as directed Topical: Mupirocin Ointment 1 x Per Week/30 Days Discharge Instructions: Apply Mupirocin (Bactroban) as instructed Prim Dressing: Maxorb Extra Ag+ Alginate Dressing, 2x2 (in/in) 1 x Per Week/30 Days ary Discharge Instructions: Apply to wound bed as instructed Secondary Dressing: Woven Gauze Sponge, Non-Sterile 4x4 in 1 x Per Week/30 Days Discharge Instructions: Apply over primary dressing as directed. Secondary Dressing: Zetuvit Plus 4x8 in 1  x Per Week/30 Days Discharge Instructions: Apply over primary dressing as directed. Com pression Wrap: FourPress (4 layer compression wrap) 1 x Per Week/30 Days Discharge Instructions: Apply four layer compression as directed. May also use Urgo K2 compression system as alternative. Com pression Wrap: Netting #5 1 x Per Week/30 Days 06/18/2022: The wound is down to just a couple of millimeters, covered by eschar. I used a curette to debride eschar and a little bit of slough in the wound. We will continue topical mupirocin with silver alginate and 4-layer compression. Follow- up in 1 week. Electronic Signature(s) Signed: 06/20/2022 11:31:33 AM By: Shawn Stall RN, BSN Signed: 06/20/2022 11:34:22 AM By: [name] MD FACS Previous Signature: 06/18/2022 10:50:01 AM Version By: [name] MD FACS Previous Signature: 06/18/2022 10:48:27 AM Version By: [name] MD FACS Entered By: Shawn Stall on 06/20/2022 11:30:13 -------------------------------------------------------------------------------- HxROS  Details Patient Name: Date of Service: Erin Philips D. 06/18/2022 9:45 A M Medical Record Number: [id number] Patient Account Number: [id number] Date of Birth/Sex: Treating RN: 21-Nov-1955 (67 y.o. F) Primary Care Provider: [name] Other Clinician: Referring Provider: Treating Provider/Extender: Jasmine Pang in Treatment: 21 Information Obtained From Patient Constitutional Symptoms (General Health) Medical History: Past Medical History Notes: morbid obesity Eyes Medical History: Positive for: Cataracts Hematologic/Lymphatic Medical History: Positive for: Lymphedema Cardiovascular Medical HistoryCASEE, WURSTER ([id number]) [id number] Page 12 of 13 Positive for: Hypertension; Peripheral Venous Disease Past Medical History Notes: schamberg disease, hyperlipidemia Gastrointestinal Medical History: Past Medical History Notes: diverticulitis , h/o obstruction due to diverticulitis , colostomy and colostomy reversal Endocrine Medical History: Past Medical History Notes: hypothyroidism Integumentary (Skin) Medical History: Negative for: History of Burn Musculoskeletal Medical History: Positive for: Osteoarthritis HBO Extended History Items Eyes: Cataracts Immunizations Pneumococcal Vaccine: Received Pneumococcal Vaccination: Yes Received Pneumococcal Vaccination On or After 60th Birthday: Yes Implantable Devices None Hospitalization / Surgery History Type of Hospitalization/Surgery colostomy reversal 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 Family and Social History Cancer: No; Diabetes: No; Heart Disease: Yes - Mother,Father; Hereditary Spherocytosis: No; Hypertension: Yes - Mother,Father; Kidney Disease: Yes - Mother; Lung Disease: No; Seizures: No; Stroke: No; Thyroid Problems: Yes - Mother; Tuberculosis: No; Never smoker; Marital Status - Single; Alcohol Use: Never; Drug Use: No History; Caffeine Use: Daily -  coffee; Financial Concerns: No; Food, Clothing or Shelter Needs: No; Support System Lacking: No; Transportation Concerns: No Electronic Signature(s) Signed: 06/18/2022 11:24:09 AM By: [name] MD FACS Entered By: [name] on 06/18/2022 10:43:41 -------------------------------------------------------------------------------- SuperBill Details Patient Name: Date of Service: Erin Philips D. 06/18/2022 Medical Record Number: [id number] Patient Account Number: [id number] Date of Birth/Sex: Treating RN: October 09, 1955 (67 y.o. 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 with other specif Modifier: ied severity Quantity: 1 Physician Procedures : CPT4 Code Description Modifier [phone] [id number] - WC PHYS LEVEL 4 - EST PT 25 ICD-10 Diagnosis Description L97.928 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

## 2022-06-19 NOTE — Patient Instructions (Signed)
Medication Instructions:  No Changes In Medications at this time.  *If you need a refill on your cardiac medications before your next appointment, please call your pharmacy*  Lab Work: None Ordered At This Time.  If you have labs (blood work) drawn today and your tests are completely normal, you will receive your results only by: MyChart Message (if you have MyChart) OR A paper copy in the mail If you have any lab test that is abnormal or we need to change your treatment, we will call you to review the results.  Testing/Procedures: None Ordered At This Time.   Follow-Up: At [facility], you and your health needs are our priority.  As part of our continuing mission to provide you with exceptional heart care, we have created designated Provider Care Teams.  These Care Teams include your primary Cardiologist (physician) and Advanced Practice Providers (APPs -  Physician Assistants and Nurse Practitioners) who all work together to provide you with the care you need, when you need it.  Your next appointment:   1 year(s)  Provider:   Branch, Mary E, MD   

## 2022-06-19 NOTE — Progress Notes (Signed)
Cardiology Office Note:    Date:  06/19/2022   ID:  Erin, [name] 01-10-1956, MRN [id number]  PCP:  Erin Peng, MD   [facility] Providers Cardiologist:  Erin Fus, MD     Referring MD: Erin Peng, MD   Chief Complaint  Patient presents with   Follow-up  AS  History of Present Illness:    [name] is a 67 y.o. female with a hx of HTN, obesity, PAD, DMII, referral for mild AS. Saw Dr. Katrinka [name] 02/21/2021 for poor R wave progression. Not indicated of an MI. Echo show 2023, shows mild AS, aortic valve is not well visualized. No significant asymmetric basal septal hypertrophy or SAM.   Today, BP well controlled. She has venous ulcers. She denies Cp , no SOB. She walks with a cane. She lives with her sister and great niece.   Past Medical History:  Diagnosis Date   Arthritis    DOE (dyspnea on exertion)    mild   Hyperlipidemia    Hypertension    Hypothyroidism    Morbid obesity (HCC)    Pre-diabetes    Schamberg disease     Past Surgical History:  Procedure Laterality Date   BOWEL RESECTION  07/10/2016   Procedure: SMALL BOWEL RESECTION repair of small bowel x 2;  Surgeon: Erin Kelp, MD;  Location: [facility];  Service: General;;   COLON RESECTION SIGMOID  07/10/2016   Procedure: sigmoid colon resection with colostomy ;  Surgeon: Erin Kelp, MD;  Location: [facility];  Service: General;;   COLOSTOMY TAKEDOWN N/A 02/06/2017   Procedure: LAPAROSCOPIC CONVERTED OPEN COLOSTOMY REVISION WITH SMALL BOWEL RESECTION;  Surgeon: Erin Levee, MD;  Location: [facility];  Service: General;  Laterality: N/A;   FLEXIBLE SIGMOIDOSCOPY N/A 07/06/2016   Procedure: [name];  Surgeon: Erin Hawking, MD;  Location: [facility];  Service: Endoscopy;  Laterality: N/A;   LAPAROSCOPY N/A 07/10/2016   Procedure: LAPAROSCOPY converted to open;  Surgeon: Erin Kelp, MD;  Location: [facility];  Service: General;  Laterality: N/A;   TONSILLECTOMY      age 67 yrs    Current Medications: Current Outpatient Medications on File Prior to Visit  Medication Sig Dispense Refill   amLODipine (NORVASC) 5 MG tablet Take 1 tablet (5 mg total) by mouth daily. 90 tablet 2   Calcium Carbonate-Vitamin D 600-400 MG-UNIT tablet Take 1 tablet by mouth daily.     Cholecalciferol (VITAMIN D) 2000 units tablet Take 4,000 Units by mouth daily.     levothyroxine (SYNTHROID) 112 MCG tablet Take 1 tablet (112 mcg total) by mouth daily. 90 tablet 1   lisinopril (ZESTRIL) 20 MG tablet Take 1 tablet (20 mg total) by mouth daily. 90 tablet 2   Magnesium 400 MG TABS Take by mouth.     Multiple Vitamins-Minerals (CENTRUM SILVER 50+WOMEN PO) Take by mouth. 1 per day     potassium chloride SA (KLOR-CON) 20 MEQ tablet Take 1 tablet (20 mEq total) by mouth daily as needed (when taking Torsemide). 90 tablet 1   simvastatin (ZOCOR) 20 MG tablet Take 1 tablet (20 mg total) by mouth daily. 90 tablet 1   torsemide (DEMADEX) 20 MG tablet Take 1 tablet (20 mg total) by mouth daily as needed (for edema). 90 tablet 1   No current facility-administered medications on file prior to visit.    Allergies:   Patient has no known allergies.   Social History   Socioeconomic History   Marital  status: Single    Spouse name: Not on file   Number of children: Not on file   Years of education: Not on file   Highest education level: Associate degree: occupational, Scientist, product/process development, or vocational program  Occupational History   Not on file  Tobacco Use   Smoking status: Never   Smokeless tobacco: Never  Vaping Use   Vaping Use: Never used  Substance and Sexual Activity   Alcohol use: No   Drug use: No   Sexual activity: Not on file  Other Topics Concern   Not on file  Social History Narrative   Not on file   Social Determinants of Health   Financial Resource Strain: Low Risk  (05/17/2022)   Overall Financial Resource Strain (CARDIA)    Difficulty of Paying Living Expenses: Not hard  at all  Food Insecurity: No Food Insecurity (05/17/2022)   Hunger Vital Sign    Worried About Running Out of Food in the Last Year: Never true    Ran Out of Food in the Last Year: Never true  Transportation Needs: No Transportation Needs (05/17/2022)   PRAPARE - Administrator, Civil Service (Medical): No    Lack of Transportation (Non-Medical): No  Physical Activity: Unknown (05/17/2022)   Exercise Vital Sign    Days of Exercise per Week: Patient declined    Minutes of Exercise per Session: Not on file  Stress: No Stress Concern Present (05/17/2022)   [employer] of Occupational Health - Occupational Stress Questionnaire    Feeling of Stress : Only a little  Social Connections: Moderately Isolated (05/17/2022)   Social Connection and Isolation Panel [NHANES]    Frequency of Communication with Friends and Family: More than three times a week    Frequency of Social Gatherings with Friends and Family: More than three times a week    Attends Religious Services: 1 to 4 times per year    Active Member of [employer] or Organizations: No    Attends Engineer, structural: Not on file    Marital Status: Never married     Family History: The patient's family history includes AAA (abdominal aortic aneurysm) in her maternal grandfather; Alzheimer's disease in her maternal grandmother; Heart Problems in her mother; Heart attack in her father and maternal uncle; Heart attack (age of onset: 18) in her maternal grandfather; Hypertension in her mother; Stroke in her paternal aunt. Her mother had a PPM.  ROS:   Please see the history of present illness.     All other systems reviewed and are negative.  EKGs/Labs/Other Studies Reviewed:    The following studies were reviewed today:   EKG:  EKG is  ordered today.  The ekg ordered today demonstrates   Recent Labs: 01/18/2022: Hemoglobin 12.8; Platelets 332 05/21/2022: ALT 17; BUN 17; Creatinine, Ser 1.05; Potassium 4.6; Sodium 142; TSH  0.978  Recent Lipid Panel    Component Value Date/Time   CHOL 188 01/18/2022 1518   TRIG 187 (H) 01/18/2022 1518   HDL 67 01/18/2022 1518   CHOLHDL 2.8 01/18/2022 1518   LDLCALC 90 01/18/2022 1518     Risk Assessment/Calculations:     Physical Exam:    VS:  BP 132/74   Pulse 88   Ht 5\' 2"  (1.575 m)   Wt 228 lb 3.2 oz (103.5 kg)   SpO2 98%   BMI 41.74 kg/m     Wt Readings from Last 3 Encounters:  06/19/22 228 lb 3.2 oz (103.5 kg)  05/21/22 224 lb (101.6 kg)  03/07/22 226 lb (102.5 kg)     GEN:  Well nourished, well developed in no acute distress HEENT: Normal NECK: No JVD; CARDIAC: RRR, +3/6 SEM RUSB,  rubs, gallops RESPIRATORY:  Clear to auscultation without rales, wheezing or rhonchi  ABDOMEN: Soft, non-tender, non-distended MUSCULOSKELETAL:  No edema; No deformity  SKIN: Warm and dry NEUROLOGIC:  Alert and oriented x 3 PSYCHIATRIC:  Normal affect   ASSESSMENT:   AS: it's mild. No further w/u required at this time. Can follow yearly and continue surveillance. PLAN:    In order of problems listed above:  1 year FU      Medication Adjustments/Labs and Tests Ordered: Current medicines are reviewed at length with the patient today.  Concerns regarding medicines are outlined above.  No orders of the defined types were placed in this encounter.  No orders of the defined types were placed in this encounter.   Patient Instructions  Medication Instructions:  No Changes In Medications at this time.   *If you need a refill on your cardiac medications before your next appointment, please call your pharmacy*  Lab Work: None Ordered At This Time.   If you have labs (blood work) drawn today and your tests are completely normal, you will receive your results only by: MyChart Message (if you have MyChart) OR A paper copy in the mail If you have any lab test that is abnormal or we need to change your treatment, we will call you to review the  results.  Testing/Procedures: None Ordered At This Time.    Follow-Up: At [facility], you and your health needs are our priority.  As part of our continuing mission to provide you with exceptional heart care, we have created designated Provider Care Teams.  These Care Teams include your primary Cardiologist (physician) and Advanced Practice Providers (APPs -  Physician Assistants and Nurse Practitioners) who all work together to provide you with the care you need, when you need it.  Your next appointment:   1 year(s)  Provider:   Maisie Fus, MD      Signed, Erin Fus, MD  06/19/2022 2:07 PM    [facility]

## 2022-06-19 NOTE — Progress Notes (Signed)
ARSEMA, WERTMAN ([id number]) [id number] Page 1 of 7 Visit Report for 06/18/2022 Arrival Information Details Patient Name: Date of Service: [name], [name] 06/18/2022 9:45 A M Medical Record Number: [id number] Patient Account Number: [id number] Date of Birth/Sex: Treating RN: 01-11-1956 (67 y.o. [name] Primary Care Nuha Degner: [name] Other Clinician: Referring Kesler Wickham: Treating Burleigh Brockmann/Extender: Jasmine Pang in Treatment: 21 Visit Information History Since Last Visit Added or deleted any medications: No Patient Arrived: [name] Any new allergies or adverse reactions: No Arrival Time: 09:57 Had a fall or experienced change in No Accompanied By: [id number] activities of daily living that may affect Transfer Assistance: None risk of falls: Patient Requires Transmission-Based Precautions: No Signs or symptoms of abuse/neglect since last visito No Patient Has Alerts: No Hospitalized since last visit: No Implantable device outside of the clinic excluding No cellular tissue based products placed in the center since last visit: Has Dressing in Place as Prescribed: Yes Pain Present Now: Yes Electronic Signature(s) Signed: 06/18/2022 5:27:15 PM By: Karie Schwalbe RN Entered By: Karie Schwalbe on 06/18/2022 09:59:51 -------------------------------------------------------------------------------- Compression Therapy Details Patient Name: Date of Service: Erin Philips D. 06/18/2022 9:45 A M Medical Record Number: [id number] Patient Account Number: [id number] Date of Birth/Sex: Treating RN: September 28, 1955 (67 y.o. [name] Primary Care Quantavis Obryant: [name] Other Clinician: Referring Laurin Paulo: Treating Tambi Thole/Extender: Jasmine Pang in Treatment: 21 Compression Therapy Performed for Wound Assessment: Wound #7 Left,Anterior Lower Leg Performed By: Clinician Karie Schwalbe, RN Compression  Type: Four Layer Post Procedure Diagnosis Same as Pre-procedure Electronic Signature(s) Signed: 06/18/2022 5:27:15 PM By: Karie Schwalbe RN Entered By: Karie Schwalbe on 06/18/2022 10:36:03 -------------------------------------------------------------------------------- Encounter Discharge Information Details Patient Name: Date of Service: Erin Philips D. 06/18/2022 9:45 A M Medical Record Number: [id number] Patient Account Number: [id number] Date of Birth/Sex: Treating RN: Apr 08, 1955 (67 y.o. [name] Primary Care Lorayne Getchell: [name] Other Clinician: Referring Flora Parks: Treating Stesha Neyens/Extender: Jasmine Pang in Treatment: 21 Encounter Discharge Information Items Post Procedure Vitals Discharge Condition: Stable Temperature (F): 98 Ambulatory Status: Cane Pulse (bpm): 85 Discharge Destination: Home Respiratory Rate (breaths/min): 20 Transportation: Private Auto Blood Pressure (mmHg): 141/84 Accompanied By: self Martie Round ([id number]) [id number] Page 2 of 7 Schedule Follow-up Appointment: Yes Clinical Summary of Care: Patient Declined Electronic Signature(s) Signed: 06/18/2022 5:27:15 PM By: Karie Schwalbe RN Entered By: Karie Schwalbe on 06/18/2022 16:54:11 -------------------------------------------------------------------------------- Lower Extremity Assessment Details Patient Name: Date of Service: [name] 06/18/2022 9:45 A M Medical Record Number: [id number] Patient Account Number: [id number] Date of Birth/Sex: Treating RN: July 21, 1955 (67 y.o. [name] Primary Care Larua Collier: [name] Other Clinician: Referring Beyza Bellino: Treating Una Yeomans/Extender: Jasmine Pang in Treatment: 21 Edema Assessment Assessed: [Left: No] [Right: No] [Left: Edema] [Right: :] Calf Left: Right: Point of Measurement: From Medial Instep 42 cm Ankle Left:  Right: Point of Measurement: From Medial Instep 22 cm Vascular Assessment Pulses: Dorsalis Pedis Palpable: [Left:Yes] Electronic Signature(s) Signed: 06/18/2022 5:27:15 PM By: Karie Schwalbe RN Entered By: Karie Schwalbe on 06/18/2022 10:11:35 -------------------------------------------------------------------------------- Multi Wound Chart Details Patient Name: Date of Service: Erin Philips D. 06/18/2022 9:45 A M Medical Record Number: [id number] Patient Account Number: [id number] Date of Birth/Sex: Treating RN: 1955/07/01 (67 y.o. F) Primary Care Orrie Schubert: [name] Other Clinician: Referring Koal Eslinger: Treating Irania Durell/Extender: Jasmine Pang in Treatment: 21 Vital Signs Height(in): 62 Pulse(bpm): Weight(lbs): 222 Blood Pressure(mmHg): Body Mass Index(BMI): 40.6 Temperature(F): Respiratory Rate(breaths/min): 20 [7:Photos:] [  N/A:N/A] Left, Anterior Lower Leg N/A N/A Wound Location: Blister N/A N/A Wounding Event: Lymphedema N/A N/A Primary Etiology: Cataracts, Lymphedema, N/A N/A Comorbid History: Hypertension, Peripheral Venous Disease, Osteoarthritis 01/22/2022 N/A N/A Date Acquired: 21 N/A N/A Weeks of Treatment: Open N/A N/A Wound Status: No N/A N/A Wound Recurrence: 0.2x0.2x0.1 N/A N/A Measurements L x W x D (cm) 0.031 N/A N/A A (cm) : rea 0.003 N/A N/A Volume (cm) : 99.00% N/A N/A % Reduction in A rea: 99.50% N/A N/A % Reduction in Volume: Full Thickness Without Exposed N/A N/A Classification: Support Structures Medium N/A N/A Exudate A mount: Serosanguineous N/A N/A Exudate Type: red, brown N/A N/A Exudate Color: Distinct, outline attached N/A N/A Wound Margin: Small (1-33%) N/A N/A Granulation A mount: Pink N/A N/A Granulation Quality: Large (67-100%) N/A N/A Necrotic A mount: Eschar, Adherent Slough N/A N/A Necrotic Tissue: Fat Layer (Subcutaneous Tissue): Yes N/A N/A Exposed Structures: Fascia:  No Tendon: No Muscle: No Joint: No Bone: No Medium (34-66%) N/A N/A Epithelialization: Debridement - Selective/Open Wound N/A N/A Debridement: Pre-procedure Verification/Time Out 10:32 N/A N/A Taken: Lidocaine 4% Topical Solution N/A N/A Pain Control: Necrotic/Eschar N/A N/A Tissue Debrided: Non-Viable Tissue N/A N/A Level: 0.03 N/A N/A Debridement A (sq cm): rea Curette N/A N/A Instrument: Minimum N/A N/A Bleeding: Pressure N/A N/A Hemostasis A chieved: 0 N/A N/A Procedural Pain: 0 N/A N/A Post Procedural Pain: Procedure was tolerated well N/A N/A Debridement Treatment Response: 0.2x0.2x0.1 N/A N/A Post Debridement Measurements L x W x D (cm) 0.003 N/A N/A Post Debridement Volume: (cm) No Abnormalities Noted N/A N/A Periwound Skin Texture: Maceration: Yes N/A N/A Periwound Skin Moisture: Dry/Scaly: Yes Hemosiderin Staining: Yes N/A N/A Periwound Skin Color: Erythema: No No Abnormality N/A N/A Temperature: Compression Therapy N/A N/A Procedures Performed: Debridement Treatment Notes Electronic Signature(s) Signed: 06/18/2022 10:42:40 AM By: [name] MD FACS Entered By: [name] on 06/18/2022 10:42:40 -------------------------------------------------------------------------------- Multi-Disciplinary Care Plan Details Patient Name: Date of Service: Erin Philips D. 06/18/2022 9:45 A M Medical Record Number: [id number] Patient Account Number: [id number] Date of Birth/Sex: Treating RN: Feb 03, 1956 (67 y.o. [name] Primary Care Alajia Schmelzer: [name] Other Clinician: Referring Stepahnie Campo: Treating Merelyn Klump/Extender: Jasmine Pang in Treatment: [address] RHIANNA, SCHWENK D ([id number]) [id number] Page 4 of 7 Abuse / Safety / Falls / Self Care Management Nursing Diagnoses: History of Falls Impaired physical mobility Goals: Patient/caregiver will identify factors that  restrict self-care and home management Date Initiated: 01/22/2022 Target Resolution Date: 09/11/2022 Goal Status: Active Interventions: Assess fall risk on admission and as needed Assess: immobility, friction, shearing, incontinence upon admission and as needed Notes: Wound/Skin Impairment Nursing Diagnoses: Impaired tissue integrity Knowledge deficit related to ulceration/compromised skin integrity Goals: Patient/caregiver will verbalize understanding of skin care regimen Date Initiated: 01/22/2022 Target Resolution Date: 09/11/2022 Goal Status: Active Interventions: Assess ulceration(s) every visit Treatment Activities: Skin care regimen initiated : 01/22/2022 Topical wound management initiated : 01/22/2022 Notes: Electronic Signature(s) Signed: 06/18/2022 5:27:15 PM By: Karie Schwalbe RN Entered By: Karie Schwalbe on 06/18/2022 16:52:03 -------------------------------------------------------------------------------- Pain Assessment Details Patient Name: Date of Service: Erin Philips D. 06/18/2022 9:45 A M Medical Record Number: [id number] Patient Account Number: [id number] Date of Birth/Sex: Treating RN: 07/10/1955 (67 y.o. [name] Primary Care Akyah Lagrange: [name] Other Clinician: Referring Kalle Bernath: Treating Briaunna Grindstaff/Extender: Jasmine Pang in Treatment: 21 Active Problems Location of Pain Severity and Description of Pain Patient Has Paino Yes Site Locations Pain Location: Generalized Pain With Dressing Change: No  Duration of the Pain. Constant / Intermittento Constant Rate the pain. Current Pain Level: 5 Worst Pain Level: 10 Least Pain Level: 3 Tolerable Pain Level: 3 MARLEY, BOBBITT D ([id number]) [phone].pdf Page 5 of 7 Character of Pain Describe the Pain: Aching Pain Management and Medication Current Pain Management: Medication: Yes Cold Application: No Rest: Yes Massage: No Activity:  No T.E.N.S.: No Heat Application: No Leg drop or elevation: No Is the Current Pain Management Adequate: Adequate How does your wound impact your activities of daily livingo Sleep: No Bathing: No Appetite: No Relationship With Others: No Bladder Continence: No Emotions: No Bowel Continence: No Work: No Toileting: No Drive: No Dressing: No Hobbies: No Electronic Signature(s) Signed: 06/18/2022 5:27:15 PM By: Karie Schwalbe RN Entered By: Karie Schwalbe on 06/18/2022 10:09:50 -------------------------------------------------------------------------------- Patient/Caregiver Education Details Patient Name: Date of Service: [name] 5/6/2024andnbsp9:45 A M Medical Record Number: [id number] Patient Account Number: [id number] Date of Birth/Gender: Treating RN: 1955-08-24 (67 y.o. [name] Primary Care Physician: [name] Other Clinician: Referring Physician: Treating Physician/Extender: Jasmine Pang in Treatment: 21 Education Assessment Education Provided To: Patient Education Topics Provided Wound/Skin Impairment: Methods: Explain/Verbal Responses: Return demonstration correctly Electronic Signature(s) Signed: 06/18/2022 5:27:15 PM By: Karie Schwalbe RN Entered By: Karie Schwalbe on 06/18/2022 16:52:17 -------------------------------------------------------------------------------- Wound Assessment Details Patient Name: Date of Service: Erin Philips D. 06/18/2022 9:45 A M Medical Record Number: [id number] Patient Account Number: [id number] Date of Birth/Sex: Treating RN: Aug 14, 1955 (67 y.o. [name] Primary Care Aryeh Butterfield: [name] Other Clinician: Referring Jenniefer Salak: Treating Roxana Lai/Extender: Jasmine Pang in Treatment: 21 Wound Status Wound Number: 7 Primary Lymphedema Etiology: Wound Location: Left, Anterior Lower Leg Wound Open Wounding Event: Blister Status: Date  Acquired: 01/22/2022 Comorbid Cataracts, Lymphedema, Hypertension, Peripheral Venous Weeks Of Treatment: 21 History: Disease, Osteoarthritis Clustered Wound: No LUCRESHA, CARLYON ([id number]) [phone].pdf Page 6 of 7 Photos Wound Measurements Length: (cm) 0.2 Width: (cm) 0.2 Depth: (cm) 0.1 Area: (cm) 0.031 Volume: (cm) 0.003 % Reduction in Area: 99% % Reduction in Volume: 99.5% Epithelialization: Medium (34-66%) Tunneling: No Undermining: No Wound Description Classification: Full Thickness Without Exposed Support Structures Wound Margin: Distinct, outline attached Exudate Amount: Medium Exudate Type: Serosanguineous Exudate Color: red, brown Foul Odor After Cleansing: No Slough/Fibrino Yes Wound Bed Granulation Amount: Small (1-33%) Exposed Structure Granulation Quality: Pink Fascia Exposed: No Necrotic Amount: Large (67-100%) Fat Layer (Subcutaneous Tissue) Exposed: Yes Necrotic Quality: Eschar, Adherent Slough Tendon Exposed: No Muscle Exposed: No Joint Exposed: No Bone Exposed: No Periwound Skin Texture Texture Color No Abnormalities Noted: Yes No Abnormalities Noted: No Erythema: No Moisture Hemosiderin Staining: Yes No Abnormalities Noted: No Dry / Scaly: Yes Temperature / Pain Maceration: Yes Temperature: No Abnormality Treatment Notes Wound #7 (Lower Leg) Wound Laterality: Left, Anterior Cleanser Soap and Water Discharge Instruction: May shower and wash wound with dial antibacterial soap and water prior to dressing change. Wound Cleanser Discharge Instruction: Cleanse the wound with wound cleanser prior to applying a clean dressing using gauze sponges, not tissue or cotton balls. Peri-Wound Care Ketoconazole Cream 2% Discharge Instruction: Apply Ketoconazole as directed Triamcinolone 15 (g) Discharge Instruction: Use triamcinolone 15 (g) as directed Zinc Oxide Ointment 30g tube Discharge Instruction: Apply Zinc Oxide to  periwound with each dressing change Sween Lotion (Moisturizing lotion) Discharge Instruction: Apply moisturizing lotion as directed Topical Mupirocin Ointment Discharge Instruction: Apply Mupirocin (Bactroban) as instructed Primary Dressing Maxorb Extra Ag+ Alginate Dressing, 2x2 (in/in) SAFIRE, BOLASH D ([id number]) [id number].pdf Page 7 of  7 Discharge Instruction: Apply to wound bed as instructed Secondary Dressing Woven Gauze Sponge, Non-Sterile 4x4 in Discharge Instruction: Apply over primary dressing as directed. Zetuvit Plus 4x8 in Discharge Instruction: Apply over primary dressing as directed. Secured With Compression Wrap FourPress (4 layer compression wrap) Discharge Instruction: Apply four layer compression as directed. May also use Urgo K2 compression system as alternative. Netting #5 Compression Stockings Add-Ons Electronic Signature(s) Signed: 06/18/2022 5:27:15 PM By: Karie Schwalbe RN Entered By: Karie Schwalbe on 06/18/2022 10:14:20 -------------------------------------------------------------------------------- Vitals Details Patient Name: Date of Service: Erin Philips D. 06/18/2022 9:45 A M Medical Record Number: [id number] Patient Account Number: [id number] Date of Birth/Sex: Treating RN: [birth year] (66 y.o. [name] Primary Care Shaden Lacher: [name] Other Clinician: Referring Mitsuo Budnick: Treating Servando Kyllonen/Extender: Jasmine Pang in Treatment: 21 Vital Signs Time Taken: 09:59 Temperature (F): 98 Height (in): 62 Pulse (bpm): 85 Weight (lbs): 222 Respiratory Rate (breaths/min): 20 Body Mass Index (BMI): 40.6 Blood Pressure (mmHg): 141/84 Reference Range: 80 - 120 mg / dl Electronic Signature(s) Signed: 06/18/2022 5:27:15 PM By: Karie Schwalbe RN Entered By: Karie Schwalbe on 06/18/2022 10:59:29

## 2022-06-20 ENCOUNTER — Other Ambulatory Visit (HOSPITAL_COMMUNITY): Payer: Self-pay

## 2022-06-20 ENCOUNTER — Other Ambulatory Visit: Payer: Self-pay | Admitting: Internal Medicine

## 2022-06-20 MED ORDER — LISINOPRIL 20 MG PO TABS
20.0000 mg | ORAL_TABLET | Freq: Every day | ORAL | 2 refills | Status: DC
  Filled 2022-06-20: qty 90, 90d supply, fill #0
  Filled 2022-09-25: qty 90, 90d supply, fill #1
  Filled 2022-12-20: qty 90, 90d supply, fill #2

## 2022-06-25 ENCOUNTER — Other Ambulatory Visit (HOSPITAL_COMMUNITY): Payer: Self-pay

## 2022-06-26 ENCOUNTER — Encounter (HOSPITAL_BASED_OUTPATIENT_CLINIC_OR_DEPARTMENT_OTHER): Payer: Commercial Managed Care - PPO | Admitting: General Surgery

## 2022-06-26 DIAGNOSIS — S80821A Blister (nonthermal), right lower leg, initial encounter: Secondary | ICD-10-CM | POA: Diagnosis not present

## 2022-06-26 DIAGNOSIS — L97928 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: Secondary | ICD-10-CM | POA: Diagnosis not present

## 2022-06-26 DIAGNOSIS — I87312 Chronic venous hypertension (idiopathic) with ulcer of left lower extremity: Secondary | ICD-10-CM | POA: Diagnosis not present

## 2022-07-03 ENCOUNTER — Ambulatory Visit (HOSPITAL_BASED_OUTPATIENT_CLINIC_OR_DEPARTMENT_OTHER): Payer: Commercial Managed Care - PPO | Admitting: General Surgery

## 2022-07-10 ENCOUNTER — Encounter (HOSPITAL_BASED_OUTPATIENT_CLINIC_OR_DEPARTMENT_OTHER): Payer: Commercial Managed Care - PPO | Admitting: General Surgery

## 2022-07-10 DIAGNOSIS — I87302 Chronic venous hypertension (idiopathic) without complications of left lower extremity: Secondary | ICD-10-CM | POA: Diagnosis not present

## 2022-07-10 DIAGNOSIS — I87312 Chronic venous hypertension (idiopathic) with ulcer of left lower extremity: Secondary | ICD-10-CM | POA: Diagnosis not present

## 2022-07-10 DIAGNOSIS — I890 Lymphedema, not elsewhere classified: Secondary | ICD-10-CM | POA: Diagnosis not present

## 2022-07-10 DIAGNOSIS — L97928 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: Secondary | ICD-10-CM | POA: Diagnosis not present

## 2022-08-02 ENCOUNTER — Other Ambulatory Visit (HOSPITAL_COMMUNITY): Payer: Self-pay

## 2022-08-02 ENCOUNTER — Other Ambulatory Visit: Payer: Self-pay | Admitting: Internal Medicine

## 2022-08-02 MED ORDER — POTASSIUM CHLORIDE CRYS ER 20 MEQ PO TBCR
20.0000 meq | EXTENDED_RELEASE_TABLET | Freq: Every day | ORAL | 1 refills | Status: DC | PRN
  Filled 2022-08-02: qty 90, 90d supply, fill #0

## 2022-08-02 MED ORDER — TORSEMIDE 20 MG PO TABS
20.0000 mg | ORAL_TABLET | Freq: Every day | ORAL | 1 refills | Status: DC | PRN
  Filled 2022-08-02: qty 90, 90d supply, fill #0

## 2022-08-03 ENCOUNTER — Other Ambulatory Visit (HOSPITAL_COMMUNITY): Payer: Self-pay

## 2022-08-13 NOTE — Progress Notes (Signed)
ULYSSES, DEWAARD ([id number]) [id number] Page 1 of 8 Visit Report for 06/26/2022 Arrival Information Details Patient Name: Date of Service: [name], [name] 06/26/2022 9:45 A M Medical Record Number: [id number] Patient Account Number: [id number] Date of Birth/Sex: Treating RN: 04-27-1955 (67 y.o. [name] Primary Care Mollyann Halbert: [name] Other Clinician: Referring Vashti Bolanos: Treating Sarena Jezek/Extender: Jasmine Pang in Treatment: 22 Visit Information History Since Last Visit Added or deleted any medications: No Patient Arrived: [name] Any new allergies or adverse reactions: No Arrival Time: 09:52 Had a fall or experienced change in No Accompanied By: self activities of daily living that may affect Transfer Assistance: None risk of falls: Patient Requires Transmission-Based Precautions: No Signs or symptoms of abuse/neglect since last visito No Patient Has Alerts: No Hospitalized since last visit: No Implantable device outside of the clinic excluding No cellular tissue based products placed in the center since last visit: Has Dressing in Place as Prescribed: Yes Has Compression in Place as Prescribed: Yes Pain Present Now: No Electronic Signature(s) Signed: 06/26/2022 5:16:49 PM By: Karie Schwalbe RN Entered By: Karie Schwalbe on 06/26/2022 09:53:04 -------------------------------------------------------------------------------- Clinic Level of Care Assessment Details Patient Name: Date of Service: [name], [name] 06/26/2022 9:45 A M Medical Record Number: [id number] Patient Account Number: [id number] Date of Birth/Sex: Treating RN: [birth year] (67 y.o. F) Primary Care Devonia Farro: [name] Other Clinician: Referring Evelina Lore: Treating Avante Carneiro/Extender: Jasmine Pang in Treatment: 22 Clinic Level of Care Assessment Items TOOL 4 Quantity Score []  - 0 Use when only an EandM is  performed on FOLLOW-UP visit ASSESSMENTS - Nursing Assessment / Reassessment []  - 0 Reassessment of Co-morbidities (includes updates in patient status) []  - 0 Reassessment of Adherence to Treatment Plan ASSESSMENTS - Wound and Skin A ssessment / Reassessment X - Simple Wound Assessment / Reassessment - one wound 1 5 X- 1 5 Complex Wound Assessment / Reassessment - multiple wounds X- 1 10 Dermatologic / Skin Assessment (not related to wound area) ASSESSMENTS - Focused Assessment X- 1 5 Circumferential Edema Measurements - multi extremities []  - 0 Nutritional Assessment / Counseling / Intervention NETANYA, SORRENTI ([id number]) [id number] Page 2 of 8 []  - 0 Lower Extremity Assessment (monofilament, tuning fork, pulses) []  - 0 Peripheral Arterial Disease Assessment (using hand held doppler) ASSESSMENTS - Ostomy and/or Continence Assessment and Care []  - 0 Incontinence Assessment and Management []  - 0 Ostomy Care Assessment and Management (repouching, etc.) PROCESS - Coordination of Care X - Simple Patient / Family Education for ongoing care 1 15 []  - 0 Complex (extensive) Patient / Family Education for ongoing care X- 1 10 Staff obtains Chiropractor, Records, T Results / Process Orders est []  - 0 Staff telephones HHA, Nursing Homes / Clarify orders / etc []  - 0 Routine Transfer to another Facility (non-emergent condition) []  - 0 Routine Hospital Admission (non-emergent condition) []  - 0 New Admissions / Manufacturing engineer / Ordering NPWT Apligraf, etc. , []  - 0 Emergency Hospital Admission (emergent condition) []  - 0 Simple Discharge Coordination []  - 0 Complex (extensive) Discharge Coordination PROCESS - Special Needs []  - 0 Pediatric / Minor Patient Management []  - 0 Isolation Patient Management []  - 0 Hearing / Language / Visual special needs []  - 0 Assessment of Community assistance (transportation, [name]/C planning, etc.) []  -  0 Additional assistance / Altered mentation []  - 0 Support Surface(s) Assessment (bed, cushion, seat, etc.) INTERVENTIONS - Wound Cleansing / Measurement X - Simple Wound Cleansing - one wound  1 5 []  - 0 Complex Wound Cleansing - multiple wounds X- 1 5 Wound Imaging (photographs - any number of wounds) []  - 0 Wound Tracing (instead of photographs) X- 1 5 Simple Wound Measurement - one wound []  - 0 Complex Wound Measurement - multiple wounds INTERVENTIONS - Wound Dressings X - Small Wound Dressing one or multiple wounds 1 10 []  - 0 Medium Wound Dressing one or multiple wounds []  - 0 Large Wound Dressing one or multiple wounds []  - 0 Application of Medications - topical []  - 0 Application of Medications - injection INTERVENTIONS - Miscellaneous []  - 0 External ear exam []  - 0 Specimen Collection (cultures, biopsies, blood, body fluids, etc.) []  - 0 Specimen(s) / Culture(s) sent or taken to Lab for analysis []  - 0 Patient Transfer (multiple staff / Nurse, adult / Similar devices) []  - 0 Simple Staple / Suture removal (25 or less) []  - 0 Complex Staple / Suture removal (26 or more) []  - 0 Hypo / Hyperglycemic Management (close monitor of Blood Glucose) [name], [name] ([id number]) [phone].pdf Page 3 of 8 []  - 0 Ankle / Brachial Index (ABI) - do not check if billed separately X- 1 5 Vital Signs Has the patient been seen at the hospital within the last three years: Yes Total Score: 80 Level Of Care: New/Established - Level 3 Electronic Signature(s) Signed: 08/13/2022 3:00:20 PM By: Pearletha Alfred Entered By: Pearletha Alfred on 07/11/2022 11:39:50 -------------------------------------------------------------------------------- Encounter Discharge Information Details Patient Name: Date of Service: Erin Rosier NITA [name] 06/26/2022 9:45 A M Medical Record Number: [id number] Patient Account Number: [id number] Date of Birth/Sex: Treating RN: September 23, 1955 (67  y.o. [name] Primary Care Lauria Depoy: [name] Other Clinician: Referring Porfirio Bollier: Treating Kasidee Voisin/Extender: Jasmine Pang in Treatment: 22 Encounter Discharge Information Items Discharge Condition: Stable Ambulatory Status: Ambulatory Discharge Destination: Home Transportation: Private Auto Accompanied By: self Schedule Follow-up Appointment: Yes Clinical Summary of Care: Patient Declined Electronic Signature(s) Signed: 06/26/2022 5:16:49 PM By: Karie Schwalbe RN Entered By: Karie Schwalbe on 06/26/2022 17:16:24 -------------------------------------------------------------------------------- Lower Extremity Assessment Details Patient Name: Date of Service: [name], [name] 06/26/2022 9:45 A M Medical Record Number: [id number] Patient Account Number: [id number] Date of Birth/Sex: Treating RN: 30-Aug-1955 (67 y.o. [name] Primary Care Sakiyah Shur: [name] Other Clinician: Referring Alyus Mofield: Treating Danijah Noh/Extender: Jasmine Pang in Treatment: 22 Edema Assessment Assessed: [Left: No] [Right: No] [Left: Edema] [Right: :] Calf Left: Right: Point of Measurement: From Medial Instep 41 cm Ankle Left: Right: Point of Measurement: From Medial Instep 21 cm Vascular Assessment Left: [phone].pdf Page 4 of 8Right:] Pulses: Dorsalis Pedis Palpable: [phone].pdf Page 4 of 8Yes] Electronic Signature(s) Signed: 06/26/2022 5:16:49 PM By: Karie Schwalbe RN Entered By: Karie Schwalbe on 06/26/2022 10:10:02 -------------------------------------------------------------------------------- Multi Wound Chart Details Patient Name: Date of Service: Erin Philips [name] 06/26/2022 9:45 A M Medical Record Number: [id number] Patient Account Number: [id number] Date of Birth/Sex: Treating RN: 08/03/1955 (67 y.o. F) Primary Care Kaveri Perras: [name] Other  Clinician: Referring Dedria Endres: Treating Buffie Herne/Extender: Jasmine Pang in Treatment: 22 Vital Signs Height(in): 62 Pulse(bpm): 76 Weight(lbs): 222 Blood Pressure(mmHg): 156/82 Body Mass Index(BMI): 40.6 Temperature(F): 97.8 Respiratory Rate(breaths/min): 18 [7:Photos:] [N/A:N/A] Left, Anterior Lower Leg N/A N/A Wound Location: Blister N/A N/A Wounding Event: Lymphedema N/A N/A Primary Etiology: Cataracts, Lymphedema, N/A N/A Comorbid History: Hypertension, Peripheral Venous Disease, Osteoarthritis 01/22/2022 N/A N/A Date Acquired: 22 N/A N/A Weeks of Treatment: Open N/A N/A Wound Status: No N/A N/A Wound  Recurrence: 0x0x0 N/A N/A Measurements L x W x [name] 0 N/A N/A A (cm) : rea 0 N/A N/A Volume (cm) : 100.00% N/A N/A % Reduction in Area: 100.00% N/A N/A % Reduction in Volume: Full Thickness Without Exposed N/A N/A Classification: Support Structures None Present N/A N/A Exudate Amount: Distinct, outline attached N/A N/A Wound Margin: None Present (0%) N/A N/A Granulation Amount: None Present (0%) N/A N/A Necrotic Amount: Fascia: No N/A N/A Exposed Structures: Fat Layer (Subcutaneous Tissue): No Tendon: No Muscle: No Joint: No Bone: No Large (67-100%) N/A N/A Epithelialization: No Abnormalities Noted N/A N/A Periwound Skin Texture: Maceration: Yes N/A N/A Periwound Skin Moisture: Dry/Scaly: Yes Hemosiderin Staining: Yes N/A N/A Periwound Skin Color: Erythema: No No Abnormality N/A N/A TemperatureNORMA, [name] ([id number]) [id number] Page 5 of 8 Treatment Notes Electronic Signature(s) Signed: 06/26/2022 10:21:58 AM By: [name] MD FACS Entered By: [name] on 06/26/2022 10:21:58 -------------------------------------------------------------------------------- Multi-Disciplinary Care Plan Details Patient Name: Date of Service: Erin Philips [name] 06/26/2022 9:45 A  M Medical Record Number: [id number] Patient Account Number: [id number] Date of Birth/Sex: Treating RN: [birth year] (67 y.o. [name] Primary Care Aisling Emigh: [name] Other Clinician: Referring Mazzy Santarelli: Treating Jordy Verba/Extender: Jasmine Pang in Treatment: 22 Active Inactive Abuse / Safety / Falls / Self Care Management Nursing Diagnoses: History of Falls Impaired physical mobility Goals: Patient/caregiver will identify factors that restrict self-care and home management Date Initiated: 01/22/2022 Target Resolution Date: 09/11/2022 Goal Status: Active Interventions: Assess fall risk on admission and as needed Assess: immobility, friction, shearing, incontinence upon admission and as needed Notes: Wound/Skin Impairment Nursing Diagnoses: Impaired tissue integrity Knowledge deficit related to ulceration/compromised skin integrity Goals: Patient/caregiver will verbalize understanding of skin care regimen Date Initiated: 01/22/2022 Target Resolution Date: 09/11/2022 Goal Status: Active Interventions: Assess ulceration(s) every visit Treatment Activities: Skin care regimen initiated : 01/22/2022 Topical wound management initiated : 01/22/2022 Notes: Electronic Signature(s) Signed: 06/26/2022 5:16:49 PM By: Karie Schwalbe RN Entered By: Karie Schwalbe on 06/26/2022 17:15:32 [name] ([id number]) [id number] Page 6 of 8 -------------------------------------------------------------------------------- Pain Assessment Details Patient Name: Date of Service: [name], [name] 06/26/2022 9:45 A M Medical Record Number: [id number] Patient Account Number: [id number] Date of Birth/Sex: Treating RN: [birth year] (67 y.o. [name] Primary Care Lavere Stork: [name] Other Clinician: Referring Skilynn Durney: Treating Lilyanna Lunt/Extender: Jasmine Pang in Treatment: 22 Active  Problems Location of Pain Severity and Description of Pain Patient Has Paino No Site Locations Pain Management and Medication Current Pain Management: Electronic Signature(s) Signed: 06/26/2022 5:16:49 PM By: Karie Schwalbe RN Entered By: Karie Schwalbe on 06/26/2022 10:09:05 -------------------------------------------------------------------------------- Patient/Caregiver Education Details Patient Name: Date of Service: [name] 5/14/2024andnbsp9:45 A M Medical Record Number: [id number] Patient Account Number: [id number] Date of Birth/Gender: Treating RN: Mar 09, 1955 (67 y.o. [name] Primary Care Physician: [name] Other Clinician: Referring Physician: Treating Physician/Extender: Jasmine Pang in Treatment: 22 Education Assessment Education Provided To: Patient Education Topics Provided Wound/Skin Impairment: Methods: Explain/Verbal Responses: Return demonstration correctly [name], [name] ([id number]) [phone].pdf Page 7 of 8 Electronic Signature(s) Signed: 06/26/2022 5:16:49 PM By: Karie Schwalbe RN Entered By: Karie Schwalbe on 06/26/2022 17:15:47 -------------------------------------------------------------------------------- Wound Assessment Details Patient Name: Date of Service: [name], [name] 06/26/2022 9:45 A M Medical Record Number: [id number] Patient Account Number: [id number] Date of Birth/Sex: Treating RN: 04/17/1955 (67 y.o. [name] Primary Care Naszir Cott: [name] Other Clinician: Referring Osbaldo Mark: Treating Rahshawn Remo/Extender: Shirlean Schlein, Daryl Eastern  in Treatment: 22 Wound Status Wound Number: 7 Primary Lymphedema Etiology: Wound Location: Left, Anterior Lower Leg Wound Open Wounding Event: Blister Status: Date Acquired: 01/22/2022 Comorbid Cataracts, Lymphedema, Hypertension, Peripheral Venous Weeks Of Treatment: 22 History: Disease,  Osteoarthritis Clustered Wound: No Photos Wound Measurements Length: (cm) Width: (cm) Depth: (cm) Area: (cm) Volume: (cm) 0 % Reduction in Area: 100% 0 % Reduction in Volume: 100% 0 Epithelialization: Large (67-100%) 0 Tunneling: No 0 Undermining: No Wound Description Classification: Full Thickness Without Exposed Support Structures Wound Margin: Distinct, outline attached Exudate Amount: None Present Foul Odor After Cleansing: No Slough/Fibrino No Wound Bed Granulation Amount: None Present (0%) Exposed Structure Necrotic Amount: None Present (0%) Fascia Exposed: No Fat Layer (Subcutaneous Tissue) Exposed: No Tendon Exposed: No Muscle Exposed: No Joint Exposed: No Bone Exposed: No Periwound Skin Texture Texture Color No Abnormalities Noted: Yes No Abnormalities Noted: Yes Moisture Temperature / Pain No Abnormalities Noted: No Temperature: No Abnormality Dry / Scaly: Yes MacerationKAESYN, PODBIELSKI ([id number]) [id number] Page 8 of 8 Electronic Signature(s) Signed: 06/26/2022 5:16:49 PM By: Karie Schwalbe RN Entered By: Karie Schwalbe on 06/26/2022 10:17:29 -------------------------------------------------------------------------------- Vitals Details Patient Name: Date of Service: Erin Rosier NITA [name] 06/26/2022 9:45 A M Medical Record Number: [id number] Patient Account Number: [id number] Date of Birth/Sex: Treating RN: December 11, 1955 (67 y.o. [name] Primary Care Youlanda Tomassetti: [name] Other Clinician: Referring Ashrita Chrismer: Treating Lilac Hoff/Extender: Jasmine Pang in Treatment: 22 Vital Signs Time Taken: 09:52 Temperature (F): 97.8 Height (in): 62 Pulse (bpm): 76 Weight (lbs): 222 Respiratory Rate (breaths/min): 18 Body Mass Index (BMI): 40.6 Blood Pressure (mmHg): 156/82 Reference Range: 80 - 120 mg / dl Electronic Signature(s) Signed: 06/26/2022 5:16:49 PM By: Karie Schwalbe RN Entered  By: Karie Schwalbe on 06/26/2022 09:53:32

## 2022-08-21 ENCOUNTER — Other Ambulatory Visit: Payer: Self-pay | Admitting: Internal Medicine

## 2022-08-21 ENCOUNTER — Other Ambulatory Visit (HOSPITAL_COMMUNITY): Payer: Self-pay

## 2022-08-21 MED ORDER — AMLODIPINE BESYLATE 5 MG PO TABS
5.0000 mg | ORAL_TABLET | Freq: Every day | ORAL | 2 refills | Status: DC
  Filled 2022-08-21 – 2022-08-23 (×2): qty 90, 90d supply, fill #0
  Filled 2022-12-20: qty 90, 90d supply, fill #1
  Filled 2023-08-01: qty 90, 90d supply, fill #2

## 2022-08-23 ENCOUNTER — Other Ambulatory Visit (HOSPITAL_COMMUNITY): Payer: Self-pay

## 2022-08-28 ENCOUNTER — Encounter (HOSPITAL_BASED_OUTPATIENT_CLINIC_OR_DEPARTMENT_OTHER): Payer: Commercial Managed Care - PPO | Admitting: General Surgery

## 2022-08-31 ENCOUNTER — Encounter (HOSPITAL_BASED_OUTPATIENT_CLINIC_OR_DEPARTMENT_OTHER): Payer: Commercial Managed Care - PPO | Attending: General Surgery | Admitting: General Surgery

## 2022-08-31 DIAGNOSIS — I87312 Chronic venous hypertension (idiopathic) with ulcer of left lower extremity: Secondary | ICD-10-CM | POA: Insufficient documentation

## 2022-08-31 DIAGNOSIS — L958 Other vasculitis limited to the skin: Secondary | ICD-10-CM | POA: Diagnosis not present

## 2022-08-31 DIAGNOSIS — I87311 Chronic venous hypertension (idiopathic) with ulcer of right lower extremity: Secondary | ICD-10-CM | POA: Diagnosis not present

## 2022-08-31 DIAGNOSIS — L97828 Non-pressure chronic ulcer of other part of left lower leg with other specified severity: Secondary | ICD-10-CM | POA: Diagnosis present

## 2022-08-31 DIAGNOSIS — I872 Venous insufficiency (chronic) (peripheral): Secondary | ICD-10-CM | POA: Diagnosis not present

## 2022-08-31 DIAGNOSIS — L97811 Non-pressure chronic ulcer of other part of right lower leg limited to breakdown of skin: Secondary | ICD-10-CM | POA: Diagnosis not present

## 2022-09-05 NOTE — Progress Notes (Signed)
CLOMA, RAHRIG ([id number]) [phone].pdf Page 1 of 4 Visit Report for 08/31/2022 Abuse Risk Screen Details Patient Name: Date of Service: [name], [name] 08/31/2022 8:00 A M Medical Record Number: [id number] Patient Account Number: [id number] Date of Birth/Sex: Treating RN: Feb 18, 1955 (67 y.o. Gevena Mart Primary Care Murad Staples: [name] Other Clinician: Referring Khaliya Golinski: Treating Kraig Genis/Extender: Jasmine Pang in Treatment: 0 Abuse Risk Screen Items Answer ABUSE RISK SCREEN: Has anyone close to you tried to hurt or harm you recentlyo No Do you feel uncomfortable with anyone in your familyo No Has anyone forced you do things that you didnt want to doo No Electronic Signature(s) Signed: 09/05/2022 11:11:00 AM By: Brenton Grills Entered By: Brenton Grills on 09/03/2022 08:02:17 -------------------------------------------------------------------------------- Activities of Daily Living Details Patient Name: Date of Service: Erin, [name] 08/31/2022 8:00 A M Medical Record Number: [id number] Patient Account Number: [id number] Date of Birth/Sex: Treating RN: [birth year] (67 y.o. Gevena Mart Primary Care Anamae Rochelle: [name] Other Clinician: Referring Sawyer Kahan: Treating Jream Broyles/Extender: Jasmine Pang in Treatment: 0 Activities of Daily Living Items Answer Activities of Daily Living (Please select one for each item) Drive Automobile Need Assistance T Medications ake Completely Able Use T elephone Completely Able Care for Appearance Completely Able Use T oilet Need Assistance Bath / Shower Completely Able Dress Self Completely Able Feed Self Completely Able Walk Need Assistance Get In / Out Bed Need Assistance Housework Need Assistance Prepare Meals Need Assistance Handle Money Need Assistance Shop for Self Completely Able Electronic Signature(s) Signed: 09/05/2022  11:11:00 AM By: Brenton Grills Entered By: Brenton Grills on 09/03/2022 08:03:58 Tiburcio Bash D ([id number]) [id number] Page 2 of 4 -------------------------------------------------------------------------------- Education Screening Details Patient Name: Date of Service: Erin, [name] 08/31/2022 8:00 A M Medical Record Number: [id number] Patient Account Number: [id number] Date of Birth/Sex: Treating RN: 1955/07/17 (67 y.o. Gevena Mart Primary Care Alesandra Smart: [name] Other Clinician: Referring Odetta Forness: Treating Tamarcus Condie/Extender: Jasmine Pang in Treatment: 0 Learning Preferences/Education Level/Primary Language Preferred Language: English Cognitive Barrier Language Barrier: No Translator Needed: No Memory Deficit: No Emotional Barrier: No Cultural/Religious Beliefs Affecting Medical Care: No Physical Barrier Impaired Vision: Yes Glasses Impaired Hearing: No Decreased Hand dexterity: No Knowledge/Comprehension Knowledge Level: Medium Comprehension Level: Medium Ability to understand written instructions: Medium Ability to understand verbal instructions: Medium Motivation Anxiety Level: Calm Cooperation: Cooperative Education Importance: Acknowledges Need Interest in Health Problems: Asks Questions Perception: Coherent Willingness to Engage in Self-Management High Activities: Readiness to Engage in Self-Management High Activities: Electronic Signature(s) Signed: 09/05/2022 11:11:00 AM By: Brenton Grills Entered By: Brenton Grills on 09/03/2022 08:04:48 -------------------------------------------------------------------------------- Fall Risk Assessment Details Patient Name: Date of Service: Erin Rosier [name] 08/31/2022 8:00 A M Medical Record Number: [id number] Patient Account Number: [id number] Date of Birth/Sex: Treating RN: [birth year] (67 y.o. Gevena Mart Primary Care Yeilin Zweber:  [name] Other Clinician: Referring Khing Belcher: Treating Graesyn Schreifels/Extender: Jasmine Pang in Treatment: 0 Fall Risk Assessment Items Have you had 2 or more falls in the last 12 monthso 0 No Have you had any fall that resulted in injury in the last 12 monthso 0 No FALLS RISK SCREEN History of falling - immediate or within 3 months 0 No NEYA, CREEGAN D ([id number]) [id number] Page 3 of 4 Secondary diagnosis (Do you have 2 or more medical diagnoseso) 0 No Ambulatory aid None/bed rest/wheelchair/nurse 0 No Crutches/cane/walker 0 No Furniture 0 No Intravenous therapy Access/Saline/Heparin Lock 0 No Gait/Transferring  Normal/ bed rest/ wheelchair 0 No Weak (short steps with or without shuffle, stooped but able to lift head while walking, may seek 0 No support from furniture) Impaired (short steps with shuffle, may have difficulty arising from chair, head down, impaired 0 No balance) Mental Status Oriented to own ability 0 No Electronic Signature(s) Signed: 09/05/2022 11:11:00 AM By: Brenton Grills Entered By: Brenton Grills on 09/03/2022 08:04:57 -------------------------------------------------------------------------------- Foot Assessment Details Patient Name: Date of Service: Erin Rosier [name] 08/31/2022 8:00 A M Medical Record Number: [id number] Patient Account Number: [id number] Date of Birth/Sex: Treating RN: Nov 15, 1955 (67 y.o. Gevena Mart Primary Care Agape Hardiman: [name] Other Clinician: Referring Sabastion Hrdlicka: Treating Seven Marengo/Extender: Jasmine Pang in Treatment: 0 Foot Assessment Items Site Locations + = Sensation present, - = Sensation absent, C = Callus, U = Ulcer R = Redness, W = Warmth, M = Maceration, PU = Pre-ulcerative lesion F = Fissure, S = Swelling, D = Dryness Assessment Right: Left: Other Deformity: No No Prior Foot Ulcer: No No Prior Amputation: No  No Charcot Joint: No No Ambulatory Status: Ambulatory With Help Assistance Device: Walker GaitLANGSTON, SUMMERFIELD ([id number]) [id number] Page 4 of 4 Electronic Signature(s) Signed: 09/05/2022 11:11:00 AM By: Brenton Grills Entered By: Brenton Grills on 09/03/2022 08:05:36 -------------------------------------------------------------------------------- Nutrition Risk Screening Details Patient Name: Date of Service: TRANA, RESSLER 08/31/2022 8:00 A M Medical Record Number: [id number] Patient Account Number: [id number] Date of Birth/Sex: Treating RN: 1955/02/14 (67 y.o. Gevena Mart Primary Care Tonjua Rossetti: [name] Other Clinician: Referring Jora Galluzzo: Treating Jametta Moorehead/Extender: Jasmine Pang in Treatment: 0 Height (in): Weight (lbs): Body Mass Index (BMI): Nutrition Risk Screening Items Score Screening NUTRITION RISK SCREEN: I have an illness or condition that made me change the kind and/or amount of food I eat 0 No I eat fewer than two meals per day 0 No I eat few fruits and vegetables, or milk products 0 No I have three or more drinks of beer, liquor or wine almost every day 0 No I have tooth or mouth problems that make it hard for me to eat 0 No I don't always have enough money to buy the food I need 0 No I eat alone most of the time 0 No I take three or more different prescribed or over-the-counter drugs a day 0 No Without wanting to, I have lost or gained 10 pounds in the last six months 0 No I am not always physically able to shop, cook and/or feed myself 0 No Nutrition Protocols Good Risk Protocol Moderate Risk Protocol High Risk Proctocol Risk Level: Good Risk Score: 0 Electronic Signature(s) Signed: 09/05/2022 11:11:00 AM By: Brenton Grills Entered By: Brenton Grills on 09/03/2022 08:05:05

## 2022-09-05 NOTE — Progress Notes (Addendum)
[name], [name] ([id number]) [id number] Page 1 of 10 Visit Report for 08/31/2022 Allergy List Details Patient Name: Date of Service: [name], [name] 08/31/2022 8:00 A M Medical Record Number: [id number] Patient Account Number: [id number] Date of Birth/Sex: Treating RN: 11-15-1955 (67 y.o. Gevena Mart Primary Care Kennan Detter: [name] Other Clinician: Referring Napoleon Monacelli: Treating Viviene Thurston/Extender: Jasmine Pang in Treatment: 0 Allergies Active Allergies No Known Drug Allergies Allergy Notes Electronic Signature(s) Signed: 09/05/2022 11:11:00 AM By: Brenton Grills Entered By: Brenton Grills on 08/30/2022 10:15:18 -------------------------------------------------------------------------------- Arrival Information Details Patient Name: Date of Service: [name] 08/31/2022 8:00 A M Medical Record Number: [id number] Patient Account Number: [id number] Date of Birth/Sex: Treating RN: May 29, 1955 (67 y.o. Gevena Mart Primary Care Tipton Ballow: [name] Other Clinician: Referring Aliviana Burdell: Treating Aaden Buckman/Extender: Jasmine Pang in Treatment: 0 Visit Information Patient Arrived: Ambulatory Arrival Time: 07:59 Accompanied By: self Transfer Assistance: Manual Patient Identification Verified: Yes Secondary Verification Process Completed: Yes Patient Requires Transmission-Based Precautions: No Patient Has Alerts: No History Since Last Visit All ordered tests and consults were completed: Yes Added or deleted any medications: No Any new allergies or adverse reactions: No Had a fall or experienced change in activities of daily living that may affect risk of falls: No Signs or symptoms of abuse/neglect since last visito No Hospitalized since last visit: No Implantable device outside of the clinic excluding cellular tissue based products placed in the center since last visit: No Pain  Present Now: No Electronic Signature(s) Signed: 09/05/2022 11:11:00 AM By: Brenton Grills Entered By: Brenton Grills on 09/03/2022 08:00:59 [name] ([id number]) [id number] Page 2 of 10 -------------------------------------------------------------------------------- Clinic Level of Care Assessment Details Patient Name: Date of Service: [name], [name] 08/31/2022 8:00 A M Medical Record Number: [id number] Patient Account Number: [id number] Date of Birth/Sex: Treating RN: [birth year] (67 y.o. Gevena Mart Primary Care Zahari Xiang: [name] Other Clinician: Referring Oretha Weismann: Treating Mollye Guinta/Extender: Jasmine Pang in Treatment: 0 Clinic Level of Care Assessment Items TOOL 1 Quantity Score X- 1 0 Use when EandM and Procedure is performed on INITIAL visit ASSESSMENTS - Nursing Assessment / Reassessment X- 1 20 General Physical Exam (combine w/ comprehensive assessment (listed just below) when performed on new pt. evals) X- 1 25 Comprehensive Assessment (HX, ROS, Risk Assessments, Wounds Hx, etc.) ASSESSMENTS - Wound and Skin Assessment / Reassessment X- 1 10 Dermatologic / Skin Assessment (not related to wound area) ASSESSMENTS - Ostomy and/or Continence Assessment and Care []  - 0 Incontinence Assessment and Management []  - 0 Ostomy Care Assessment and Management (repouching, etc.) PROCESS - Coordination of Care []  - 0 Simple Patient / Family Education for ongoing care X- 1 20 Complex (extensive) Patient / Family Education for ongoing care X- 1 10 Staff obtains Chiropractor, Records, T Results / Process Orders est []  - 0 Staff telephones HHA, Nursing Homes / Clarify orders / etc []  - 0 Routine Transfer to another Facility (non-emergent condition) []  - 0 Routine Hospital Admission (non-emergent condition) X- 1 15 New Admissions / Manufacturing engineer / Ordering NPWT Apligraf, etc. , []  - 0 Emergency  Hospital Admission (emergent condition) PROCESS - Special Needs []  - 0 Pediatric / Minor Patient Management []  - 0 Isolation Patient Management []  - 0 Hearing / Language / Visual special needs []  - 0 Assessment of Community assistance (transportation, [name]/C planning, etc.) []  - 0 Additional assistance / Altered mentation []  - 0 Support Surface(s) Assessment (bed, cushion, seat, etc.)  INTERVENTIONS - Miscellaneous []  - 0 External ear exam []  - 0 Patient Transfer (multiple staff / Nurse, adult / Similar devices) []  - 0 Simple Staple / Suture removal (25 or less) []  - 0 Complex Staple / Suture removal (26 or more) []  - 0 Hypo/Hyperglycemic Management (do not check if billed separately) X- 1 15 Ankle / Brachial Index (ABI) - do not check if billed separately Has the patient been seen at the hospital within the last three years: Yes Total Score: 115 Level Of Care: New/Established - Level 3 Electronic Signature(s) Signed: 09/05/2022 11:11:00 AM By: Brenton Grills Entered By: Brenton Grills on 09/03/2022 08:23:26 [name] ([id number]) [id number] Page 3 of 10 -------------------------------------------------------------------------------- Encounter Discharge Information Details Patient Name: Date of Service: [name] 08/31/2022 8:00 A M Medical Record Number: [id number] Patient Account Number: [id number] Date of Birth/Sex: Treating RN: [birth year] (67 y.o. Gevena Mart Primary Care Iridian Reader: [name] Other Clinician: Referring Rennee Coyne: Treating Nova Evett/Extender: Jasmine Pang in Treatment: 0 Encounter Discharge Information Items Post Procedure Vitals Discharge Condition: Stable Temperature (F): 98.2 Ambulatory Status: Walker Pulse (bpm): 82 Discharge Destination: Home Respiratory Rate (breaths/min): 18 Transportation: Private Auto Blood Pressure (mmHg): 142/83 Accompanied By: daughter Schedule  Follow-up Appointment: Yes Clinical Summary of Care: Patient Declined Electronic Signature(s) Signed: 09/05/2022 11:11:00 AM By: Brenton Grills Entered By: Brenton Grills on 09/03/2022 08:27:10 -------------------------------------------------------------------------------- Lower Extremity Assessment Details Patient Name: Date of Service: [name] 08/31/2022 8:00 A M Medical Record Number: [id number] Patient Account Number: [id number] Date of Birth/Sex: Treating RN: 1955-12-22 (67 y.o. Gevena Mart Primary Care Beda Dula: [name] Other Clinician: Referring Reisha Wos: Treating Maisey Deandrade/Extender: Jasmine Pang in Treatment: 0 Vascular Assessment Pulses: Dorsalis Pedis Palpable: [Left:Yes] [Right:Yes] Extremity colors, hair growth, and conditions: Hair Growth on Extremity: [Left:No] [Right:No] Temperature of Extremity: [Left:Warm] [Right:Warm] Capillary Refill: [Left:< 3 seconds] [Right:< 3 seconds] Dependent Rubor: [Left:No] [Right:No] Blanched when Elevated: [Left:No] [Right:No] Lipodermatosclerosis: [Left:No] [Right:No] Blood Pressure: Brachial: [Left:142] [Right:142] Ankle: [Left:Dorsalis Pedis: 145 1.02] [Right:Dorsalis Pedis: 156 1.10] Toe Nail Assessment Left: Right: Thick: Yes Yes Discolored: Yes Yes Deformed: Yes Yes Improper Length and Hygiene: Yes Yes Electronic Signature(s) Signed: 09/05/2022 11:11:00 AM By: Brenton Grills Entered By: Brenton Grills on 09/03/2022 08:08:18 [name] ([id number]) [id number] Page 4 of 10 -------------------------------------------------------------------------------- Multi Wound Chart Details Patient Name: Date of Service: [name], [name] 08/31/2022 8:00 A M Medical Record Number: [id number] Patient Account Number: [id number] Date of Birth/Sex: Treating RN: 02/21/1955 (67 y.o. F) Primary Care Sweetie Giebler: [name] Other Clinician: Referring  Atari Novick: Treating Toriann Spadoni/Extender: Jasmine Pang in Treatment: 0 Vital Signs Height(in): Pulse(bpm): 82 Weight(lbs): Blood Pressure(mmHg): 142/83 Body Mass Index(BMI): Temperature(F): 97.8 Respiratory Rate(breaths/min): 18 [8:Photos: No Photos Left, Proximal, Anterior Lower Leg Wound Location: Gradually Appeared Wounding Event: Venous Leg Ulcer Primary Etiology: Cataracts, Lymphedema, Comorbid History: Hypertension, Peripheral Venous Disease, Osteoarthritis 07/14/2022 Date Acquired: 0  Weeks of Treatment: Open Wound Status: No Wound Recurrence: 2.5x2.5x0.3 Measurements L x W x [name] 4.909 A (cm) : rea 1.473 Volume (cm) : Full Thickness Without Exposed Classification: Support Structures Medium Exudate A mount: Serosanguineous Exudate  Type: red, brown Exudate Color: Distinct, outline attached Wound Margin: Medium (34-66%) Granulation A mount: Red, Pink Granulation Quality: Medium (34-66%) Necrotic A mount: Fat Layer (Subcutaneous Tissue): Yes Fat Layer (Subcutaneous Tissue): Yes N/A  Exposed Structures: Debridement - Excisional Debridement: Pre-procedure Verification/Time Out 08:15 Taken: Lidocaine 4% Topical Solution Pain Control: Subcutaneous, Slough Tissue Debrided:  Skin/Subcutaneous Tissue Level: 4.91 Debridement A (sq cm): rea  Curette Instrument: Minimum Bleeding: Pressure Hemostasis A chieved: 0 Procedural Pain: 0 Post Procedural Pain: Procedure was tolerated well Debridement Treatment Response: 2.5x2.5x0.3 Post Debridement Measurements L x W x [name] 1.473 Post Debridement  Volume: (cm) No Abnormalities Noted Periwound Skin Texture: Periwound Skin Moisture: No Abnormalities Noted Periwound Skin Color: No photo taken as computers were Assessment Notes: down. Debridement Procedures Performed:] [9:No Photos Left, Distal,  Anterior Lower Leg Gradually Appeared Vasculitis Cataracts, Lymphedema, Hypertension, Peripheral Venous Disease, Osteoarthritis 07/14/2022 0 Open No  1.5x8x0.1 9.425 0.942 Full Thickness Without Exposed Support Structures Medium Serosanguineous red, brown  Distinct, outline attached Medium (34-66%) Red, Pink Medium (34-66%) Debridement - Selective/Open Wound N/A 08:15 Lidocaine 4% Topical Solution Necrotic/Eschar, Slough Non-Viable Tissue 9.42 Curette Minimum Pressure 0 0 Procedure was tolerated well  1.5x8x0.1 0.942 No Abnormalities Noted Maceration: No Dry/Scaly: No No Abnormalities Noted No photo taken as computers were down. Debridement] [N/A:N/A N/A N/A N/A N/A N/A N/A N/A N/A N/A N/A N/A N/A N/A N/A N/A N/A N/A N/A N/A N/A N/A N/A N/A N/A N/A  N/A N/A N/A N/A N/A N/A N/A N/A N/A N/A N/A N/A] Treatment Notes Wound #8 (Lower Leg) Wound Laterality: Left, Anterior, Proximal Cleanser Vashe 5.8 (oz) MARGIT, BATTE [name] ([id number]) [id number] Page 5 of 10 Discharge Instruction: Cleanse the wound with Vashe prior to applying a clean dressing using gauze sponges, not tissue or cotton balls. Peri-Wound Care Topical Mupirocin Ointment Discharge Instruction: Apply Mupirocin (Bactroban) as instructed Primary Dressing Maxorb Extra Ag+ Alginate Dressing, 4x4.75 (in/in) Discharge Instruction: Apply to wound bed as instructed Secondary Dressing Secured With Compression Wrap FourPress (4 layer compression wrap) Discharge Instruction: Apply four layer compression as directed. May also use Urgo K2 compression system as alternative. Compression Stockings Add-Ons Wound #9 (Lower Leg) Wound Laterality: Left, Anterior, Distal Cleanser Vashe 5.8 (oz) Discharge Instruction: Cleanse the wound with Vashe prior to applying a clean dressing using gauze sponges, not tissue or cotton balls. Peri-Wound Care Topical Mupirocin Ointment Discharge Instruction: Apply Mupirocin (Bactroban) as instructed Primary Dressing Maxorb Extra Ag+ Alginate Dressing, 4x4.75 (in/in) Discharge Instruction: Apply to wound bed as instructed Secondary  Dressing Secured With Compression Wrap FourPress (4 layer compression wrap) Discharge Instruction: Apply four layer compression as directed. May also use Urgo K2 compression system as alternative. Compression Stockings Add-Ons Electronic Signature(s) Signed: 09/10/2022 9:14:35 AM By: [name] MD FACS Entered By: [name] on 09/10/2022 09:14:35 -------------------------------------------------------------------------------- Multi-Disciplinary Care Plan Details Patient Name: Date of Service: Erin Rosier NITA [name] 08/31/2022 8:00 A M Medical Record Number: [id number] Patient Account Number: [id number] Date of Birth/Sex: Treating RN: November 13, 1955 (67 y.o. Gevena Mart Primary Care Lashaye Fisk: [name] Other Clinician: Referring Woodie Trusty: Treating Nora Rooke/Extender: Jasmine Pang in Treatment: [address] [place], [place] ([id number]) [id number] Page 6 of 10 Wound/Skin Impairment Nursing Diagnoses: Impaired tissue integrity Goals: Patient/caregiver will verbalize understanding of skin care regimen Date Initiated: 09/03/2022 Target Resolution Date: 09/03/2022 Goal Status: Active Interventions: Assess patient/caregiver ability to obtain necessary supplies Assess patient/caregiver ability to perform ulcer/skin care regimen upon admission and as needed Assess ulceration(s) every visit Provide education on ulcer and skin care Screen for HBO Treatment Activities: Skin care regimen initiated : 08/31/2022 Topical wound management initiated : 08/31/2022 Notes: Electronic Signature(s) Signed: 09/05/2022 11:11:00 AM By: Brenton Grills Entered By: Brenton Grills on 09/03/2022 08:21:25 -------------------------------------------------------------------------------- Pain Assessment Details Patient Name: Date of Service: Erin Rosier NITA [name] 08/31/2022 8:00 A M Medical  Record Number: [id number] Patient Account Number:  [id number] Date of Birth/Sex: Treating RN: 21-Jun-1955 (67 y.o. Gevena Mart Primary Care Kersten Salmons: [name] Other Clinician: Referring Ireoluwa Grant: Treating Fard Borunda/Extender: Jasmine Pang in Treatment: 0 Active Problems Location of Pain Severity and Description of Pain Patient Has Paino No Site Locations Pain Management and Medication Current Pain Management: Electronic Signature(s) Signed: 09/05/2022 11:11:00 AM By: Ricky Stabs, Octavio Graves [name] ([id number]) AM By: Brenton Grills [phone].pdf Page 7 of 10 Signed: 09/05/2022 11:11:00 Entered By: Brenton Grills on 09/03/2022 08:14:04 -------------------------------------------------------------------------------- Patient/Caregiver Education Details Patient Name: Date of Service: [name], Erin NITA [name] 7/19/2024andnbsp8:00 A M Medical Record Number: [id number] Patient Account Number: [id number] Date of Birth/Gender: Treating RN: Oct 29, 1955 (67 y.o. Gevena Mart Primary Care Physician: [name] Other Clinician: Referring Physician: Treating Physician/Extender: Jasmine Pang in Treatment: 0 Education Assessment Education Provided To: Patient and Caregiver Education Topics Provided Wound/Skin Impairment: Methods: Explain/Verbal Responses: State content correctly Electronic Signature(s) Signed: 09/05/2022 11:11:00 AM By: Brenton Grills Entered By: Brenton Grills on 09/03/2022 08:21:51 -------------------------------------------------------------------------------- Wound Assessment Details Patient Name: Date of Service: Erin Rosier NITA [name] 08/31/2022 8:00 A M Medical Record Number: [id number] Patient Account Number: [id number] Date of Birth/Sex: Treating RN: [birth year] (67 y.o. Gevena Mart Primary Care Mathieu Schloemer: [name] Other Clinician: Referring Euva Rundell: Treating Hogan Hoobler/Extender: Jasmine Pang in  Treatment: 0 Wound Status Wound Number: 8 Primary Venous Leg Ulcer Etiology: Wound Location: Left, Proximal, Anterior Lower Leg Wound Open Wounding Event: Gradually Appeared Status: Date Acquired: 07/14/2022 Comorbid Cataracts, Lymphedema, Hypertension, Peripheral Venous Weeks Of Treatment: 0 History: Disease, Osteoarthritis Clustered Wound: No Wound Measurements Length: (cm) 2.5 Width: (cm) 2.5 Depth: (cm) 0.3 Area: (cm) 4.909 Volume: (cm) 1.473 % Reduction in Area: % Reduction in Volume: Tunneling: No Undermining: No Wound Description Classification: Full Thickness Without Exposed Support Structures Wound Margin: Distinct, outline attached Exudate Amount: Medium Exudate Type: Serosanguineous Exudate Color: red, brown [name], [name] ([id number]) Foul Odor After Cleansing: No Slough/Fibrino No [id number] Page 8 of 10 Wound Bed Granulation Amount: Medium (34-66%) Exposed Structure Granulation Quality: Red, Pink Fat Layer (Subcutaneous Tissue) Exposed: Yes Necrotic Amount: Medium (34-66%) Necrotic Quality: Adherent Slough Periwound Skin Texture Texture Color No Abnormalities Noted: Yes No Abnormalities Noted: Yes Moisture No Abnormalities Noted: No Assessment Notes No photo taken as computers were down. Treatment Notes Wound #8 (Lower Leg) Wound Laterality: Left, Anterior, Proximal Cleanser Vashe 5.8 (oz) Discharge Instruction: Cleanse the wound with Vashe prior to applying a clean dressing using gauze sponges, not tissue or cotton balls. Peri-Wound Care Topical Mupirocin Ointment Discharge Instruction: Apply Mupirocin (Bactroban) as instructed Primary Dressing Maxorb Extra Ag+ Alginate Dressing, 4x4.75 (in/in) Discharge Instruction: Apply to wound bed as instructed Secondary Dressing Secured With Compression Wrap FourPress (4 layer compression wrap) Discharge Instruction: Apply four layer compression as directed. May also use  Urgo K2 compression system as alternative. Compression Stockings Add-Ons Electronic Signature(s) Signed: 09/05/2022 11:11:00 AM By: Brenton Grills Entered By: Brenton Grills on 09/03/2022 08:11:12 -------------------------------------------------------------------------------- Wound Assessment Details Patient Name: Date of Service: Erin Rosier NITA [name] 08/31/2022 8:00 A M Medical Record Number: [id number] Patient Account Number: [id number] Date of Birth/Sex: Treating RN: 1955/09/14 (67 y.o. Gevena Mart Primary Care Peniel Hass: [name] Other Clinician: Referring Tristin Vandeusen: Treating Jasmain Ahlberg/Extender: Jasmine Pang in Treatment: 0 Wound Status Wound Number: 9 Primary Vasculitis Etiology: Wound Location: Left, Distal, Anterior Lower Leg Wound Open Wounding Event: Gradually Appeared Status: Date Acquired: 07/14/2022 Comorbid Cataracts, Lymphedema, Hypertension,  Peripheral Venous Weeks Of Treatment: 0 History: Disease, Osteoarthritis Clustered Wound: No [name], [name] ([id number]) [id number] Page 9 of 10 Wound Measurements Length: (cm) 1.5 Width: (cm) 8 Depth: (cm) 0.1 Area: (cm) 9.425 Volume: (cm) 0.942 % Reduction in Area: % Reduction in Volume: Tunneling: No Undermining: No Wound Description Classification: Full Thickness Without Exposed Support Structures Wound Margin: Distinct, outline attached Exudate Amount: Medium Exudate Type: Serosanguineous Exudate Color: red, brown Foul Odor After Cleansing: No Slough/Fibrino No Wound Bed Granulation Amount: Medium (34-66%) Exposed Structure Granulation Quality: Red, Pink Fat Layer (Subcutaneous Tissue) Exposed: Yes Necrotic Amount: Medium (34-66%) Necrotic Quality: Adherent Slough Periwound Skin Texture Texture Color No Abnormalities Noted: Yes No Abnormalities Noted: Yes Moisture No Abnormalities Noted: No Dry / Scaly: No Maceration: No Assessment Notes No  photo taken as computers were down. Treatment Notes Wound #9 (Lower Leg) Wound Laterality: Left, Anterior, Distal Cleanser Vashe 5.8 (oz) Discharge Instruction: Cleanse the wound with Vashe prior to applying a clean dressing using gauze sponges, not tissue or cotton balls. Peri-Wound Care Topical Mupirocin Ointment Discharge Instruction: Apply Mupirocin (Bactroban) as instructed Primary Dressing Maxorb Extra Ag+ Alginate Dressing, 4x4.75 (in/in) Discharge Instruction: Apply to wound bed as instructed Secondary Dressing Secured With Compression Wrap FourPress (4 layer compression wrap) Discharge Instruction: Apply four layer compression as directed. May also use Urgo K2 compression system as alternative. Compression Stockings Add-Ons Electronic Signature(s) Signed: 09/05/2022 11:11:00 AM By: Brenton Grills Entered By: Brenton Grills on 09/03/2022 08:13:48 Vitals Details -------------------------------------------------------------------------------- [name] ([id number]) [id number] Page 10 of 10 Patient Name: Date of Service: [name], [name] 08/31/2022 8:00 A M Medical Record Number: [id number] Patient Account Number: [id number] Date of Birth/Sex: Treating RN: [birth year] (67 y.o. Gevena Mart Primary Care Esaiah Wanless: [name] Other Clinician: Referring Sheina Mcleish: Treating Burl Tauzin/Extender: Jasmine Pang in Treatment: 0 Vital Signs Time Taken: 08:00 Temperature (F): 97.8 Pulse (bpm): 82 Respiratory Rate (breaths/min): 18 Blood Pressure (mmHg): 142/83 Reference Range: 80 - 120 mg / dl Electronic Signature(s) Signed: 09/05/2022 11:11:00 AM By: Brenton Grills Entered By: Brenton Grills on 09/03/2022 08:01:56

## 2022-09-05 NOTE — Progress Notes (Addendum)
[name], [name] ([id number]) [id number] Page 1 of 14 Visit Report for 08/31/2022 Chief Complaint Document Details Patient Name: Date of Service: [name] 08/31/2022 8:00 A M Medical Record Number: [id number] Patient Account Number: [id number] Date of Birth/Sex: Treating RN: 1955-05-11 (67 y.o. F) Primary Care Provider: [name] Other Clinician: Referring Provider: Treating Provider/Extender: Jasmine Pang in Treatment: 0 Information Obtained from: Patient Chief Complaint 04/19/2021: The patient is here for ongoing follow-up regarding 2 left lower extremity wounds. 01/22/2022; patient returns to clinic with a wound on the left anterior lower leg secondary to trauma Electronic Signature(s) Signed: 09/10/2022 9:14:42 AM By: [name] MD FACS Entered By: [name] on 09/10/2022 09:14:42 -------------------------------------------------------------------------------- Debridement Details Patient Name: Date of Service: [name] 08/31/2022 8:00 A M Medical Record Number: [id number] Patient Account Number: [id number] Date of Birth/Sex: Treating RN: 08/28/1955 (67 y.o. Gevena Mart Primary Care Provider: [name] Other Clinician: Referring Provider: Treating Provider/Extender: Jasmine Pang in Treatment: 0 Debridement Performed for Assessment: Wound #8 Left,Proximal,Anterior Lower Leg Performed By: Physician [name], MD Debridement Type: Debridement Severity of Tissue Pre Debridement: Fat layer exposed Level of Consciousness (Pre-procedure): Awake and Alert Pre-procedure Verification/Time Out Yes - 08:15 Taken: Pain Control: Lidocaine 4% Topical Solution Percent of Wound Bed Debrided: 100% T Area Debrided (cm): otal 4.91 Tissue and other material debrided: Slough, Subcutaneous, Slough Level: Skin/Subcutaneous Tissue Debridement Description:  Excisional Instrument: Curette Bleeding: Minimum Hemostasis Achieved: Pressure Procedural Pain: 0 Post Procedural Pain: 0 Response to Treatment: Procedure was tolerated well Level of Consciousness (Post- Awake and Alert procedure): Post Debridement Measurements of Total Wound Length: (cm) 2.5 Width: (cm) 2.5 Depth: (cm) 0.3 Volume: (cm) 1.473 Character of Wound/Ulcer Post Debridement: Stable Severity of Tissue Post Debridement: Fat layer exposed [name], [name] ([id number]) [phone].pdf Page 2 of 14 Post Procedure Diagnosis Same as Pre-procedure Notes Scribed for Dr Lady Gary by Brenton Grills RN Electronic Signature(s) Signed: 09/03/2022 11:13:49 AM By: [name] MD FACS Signed: 09/05/2022 11:11:00 AM By: Brenton Grills Entered By: Brenton Grills on 09/03/2022 08:16:11 -------------------------------------------------------------------------------- Debridement Details Patient Name: Date of Service: [name] 08/31/2022 8:00 A M Medical Record Number: [id number] Patient Account Number: [id number] Date of Birth/Sex: Treating RN: [birth year] (67 y.o. Gevena Mart Primary Care Provider: [name] Other Clinician: Referring Provider: Treating Provider/Extender: Jasmine Pang in Treatment: 0 Debridement Performed for Assessment: Wound #9 Left,Distal,Anterior Lower Leg Performed By: Physician [name], MD Debridement Type: Debridement Level of Consciousness (Pre-procedure): Awake and Alert Pre-procedure Verification/Time Out Yes - 08:15 Taken: Pain Control: Lidocaine 4% Topical Solution Percent of Wound Bed Debrided: 100% T Area Debrided (cm): otal 9.42 Tissue and other material debrided: Eschar, Slough, Slough Level: Non-Viable Tissue Debridement Description: Selective/Open Wound Instrument: Curette Bleeding: Minimum Hemostasis Achieved: Pressure Procedural Pain: 0 Post Procedural Pain:  0 Response to Treatment: Procedure was tolerated well Level of Consciousness (Post- Awake and Alert procedure): Post Debridement Measurements of Total Wound Length: (cm) 1.5 Width: (cm) 8 Depth: (cm) 0.1 Volume: (cm) 0.942 Character of Wound/Ulcer Post Debridement: Stable Post Procedure Diagnosis Same as Pre-procedure Notes Scribed for Dr Lady Gary by Brenton Grills RN Electronic Signature(s) Signed: 09/03/2022 11:13:49 AM By: [name] MD FACS Signed: 09/05/2022 11:11:00 AM By: Brenton Grills Entered By: Brenton Grills on 09/03/2022 08:17:11 [name], [name] ([id number]) [id number] Page 3 of 14 -------------------------------------------------------------------------------- HPI Details Patient Name: Date of Service: [name], [name] 08/31/2022 8:00 A M Medical Record Number: [id number]  Patient Account Number: [id number] Date of Birth/Sex: Treating RN: Dec 09, 1955 (67 y.o. 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 She tells [name] that she fell on the gravel driveway in July. She developed injuries on her distal lower leg which have not healed. She saw her primary physician on 11/15/2017 who noted her left shin injuries. Gave her antibiotics. At that point the wounds were almost circumferential however most were less than 1.5 cm. Weeping edema fluid was noted. She was referred here for evaluation. The patient has a history of chronic lower extremity edema. 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 She has had prior history with leg weeping fluid. She does not wear compression  stockings. She is not doing anything specific to these wound areas. 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 She also has lymphedema. Put her in 3 layer compression and silver alginate last week. She has 3 small wounds with depth just lateral to the tibia. More problematically than this she has numerous shallow areas some of which are almost canal like in shape with tightly adherent painful debris. It would be very difficult and time- consuming to go through this and attempt to individually debride all these areas.. I changed her to collagen today to see if that would help with any of the surface debris on some of these wounds. Otherwise we will not be able to put this in compression we have to have someone change the dressing. The patient is not eligible for home health 12/25/17 on evaluation today patient actually appears to be doing rather well in regard to the ulcer on her lower extremity. Fortunately there does not appear to be evidence of infection at this time. She has been tolerating the dressing changes without complication. This includes the compression wrap. The only issue she had was that the wrap was initially placed over her bunion region which actually calls her some discomfort and pain. Other than that things seem to be going rather well. 01/01/2018 Seen today for follow-up and management of left lower extremity wound and lymphedema. T oday she presents with a new wound towards to the left lateral LE. Recently treated with a 7 day course of amoxicillin; reason for antibiotic dose is unknown at this time. Tolerating current treatment of collagen with 4- layer wraps. She obtained a venous reflux study on  12/25/17. Studies show on the right abnormal reflux times of the popliteal vein, great saphenous vein at the saphenofemoral junction at the proximal thigh, great saphenous vein at the mid calf, and origin of the small saphenous vein.No superficial thrombosis. No deep vein thrombosis in the common femoral, femoral,and popliteal veins. Left abnormal reflex times as well of the common femoral vein, popliteal vein, and a great saphenous vein at the saphenofemoral junction, In great saphenous vein at the mid thigh w/o thrombosis. Has any issues or concerns during visit today. Recommended follow-up to vascular specialist due to abnormalities from the venous reflux study.  Denies fever, pain, chills, dizziness, nausea, or vomiting. 01/08/18 upon evaluation today the patient actually seems to be showing some signs of improvement in my opinion at this point in regard to the lower extremity ulcerated areas. She still has a lot of drainage but fortunately nothing that appears to be too significant currently. I have been very happy with the overall progress I see today compared to where things were during the last evaluation that I had with her. Nonetheless she has not had her appointment with the vein specialist as of yet in fact we were able to get this approved for her today and confirmed with them she will be seeing them on December 26. Nonetheless in general I do feel like the compression wraps is doing well for her. 01/17/18; quite a bit of improvement since last time I saw this patient she has a small open area remaining on the left lateral calf and even smaller area medially. She has lymphedema chronic stasis changes with distal skin fibrosis. She has an appointment with vascular surgery later this month 01/24/2018; the patient's medial leg has closed. Still a small open area on the left lateral leg. She states that the 4 layer compression we put on last week was too tight and she had to take it off over a  few days ago. We have had resultant increase in her lymphedema in the dorsal foot and a proximal calf. Fortunately that does not seem to have resulted in any deterioration in her wounds The patient is going to need compression stockings. We have given her measurements to phone elastic therapy in [place]. She has vascular surgery consult on December 26 02/07/18; she's had continuous contraction on the left lateral leg wound which is now very small. Currently using silver alginate under 3 layer compression She saw Dr. Myra Gianotti of vascular surgery on 02/06/18. It was noted that she had a normal reflux times in the popliteal vein, great saphenous vein at the saphenofemoral junction, great saphenous vein at the proximal thigh great saphenous vein at the mid calf and origin of the small saphenous vein. It was noted that she had significant reflux in the left saphenous veins with diameter measurements in the 0.7-0.8 cm range. It was felt she would benefit from laser ablation to help minimize the risk of ulcer recurrence. It was recommended that she wear 20-30 thigh-high compression stockings and have follow-up in 4-6 weeks 02/17/2018; I thought this lady would be healed however her compression slipped down and she developed increasing swelling and the wound is actually larger. 1/13; we had deterioration last week after the patient's compression slipped down and she developed periwound swelling. I increased her compression before layers we have been using silver alginate we are a lot better again today. She has her compression stockings in waiting 1/23; the patient's wounds are totally healed today. She has her stockings. This was almost circumferential skin damage. She follows up with Dr. Myra Gianotti of vascular surgery next Monday. READMISSION 02/21/2021 This is a now 67 year old woman that we had in clinic here discharging in January 2020 with wounds on her left calf chronic venous insufficiency. She  was discharged with 30/40 stockings. It does not sound like she has worn stockings in about a year largely from not being able to get them on herself. In November she developed new blisters on her legs an area laterally is opened into a fairly sizable wound. She has weeping posteriorly as well. She has been using Neosporin and Band-Aids. The  patient did see Dr. Myra Gianotti in 2019 and 2020. He felt she might benefit from laser ablation in her left saphenous veins although because of the wound that healed I do not think he went through with it. She might benefit from seeing him again. Her ABI on the left is 1. 1/17; patient's wound on the posterior left calf is closed she has the 2 large areas last week. We put her in 4-layer compression for edema control is a lot better. Our intake nurse noted greenish drainage and odor. We have been using silver alginate [name], [name] ([id number]) [id number] Page 4 of 14 1/25; PCR culture I did have the substantial wound area on the left lateral lower leg showed staff aureus and group A strep. Low titers of coag negative staph which are probably skin contaminants. Resistance detected to tetracycline methicillin and macrolides. I gave her a starter kit of Nuzyra 150 mg x 3 for 2 days then 300 mg for a further 7 days. Marked odor considerable increase in surrounding erythema. We are using Iodoflex last week however I have changed to silver alginate with underlying Bactroban 2/1; she is completing her [place] tomorrow. The degree of erythema around the wounds looks a lot better. Odor has improved. I gave her silver alginate and Bactroban last week and changing her back to Iodoflex to continue with ongoing debridement 2/8; the periwound looks a lot better. Surface of the wound also looks somewhat better although there is still ongoing debridement to be done we have been using Iodoflex under compression. Primary dressing and silver  alginate 2/15; left posterior calf. Some improvement in the surface of the wound but still very gritty we have been using Iodoflex under compression. 04/05/2021: Left lateral posterior calf. She continues to have significant amounts of drainage, some of which is probably comprised of the Iodoflex microbleeds. There is no odor to the drainage. The satellite lesion on the more posterior aspect of the calf is epithelializing nicely and has contracted quite a bit. There is robust granulation tissue in the dominant wound with minimal adherent slough. 04/12/2021: The satellite lesion has nearly closed. She continues to have good granulation tissue with minimal slough of the larger primary wound. She continues to have a fair amount of drainage, but I think this is less secondary to switching her dressing from Iodoflex to Prisma. 04/19/2021: The satellite lesion is almost completely epithelialized. The larger primary wound continues to contract with good granulation tissue and minimal slough. She is currently in Lantana with compression. 04/26/2021: The satellite lesion has closed. The larger primary wound has contracted further and the granulation tissue is robust without being hypertrophic. Minimal slough is present. 05/03/2021: The satellite lesion remains closed. The larger primary wound has a bit of slough, but has also contracted further with good perimeter epithelialization. Good granulation tissue at the wound surface. There was some greenish drainage on the dressing when it was removed; it is not the blue-green typically associated with Pseudomonas aeruginosa, however. 05/10/2021: The primary wound continues to contract. There is minimal slough. The granulation tissue at 9:00 is a bit hypertrophic. No significant drainage. No odor. 05/17/2021: The wound is a little bit smaller today with good granulation tissue. Minimal slough. No significant drainage or odor. 05/24/2021: The wound continues to contract and  has a nice base of granulation tissue. Small amount of slough. No concern for infection. 05/31/2021: For some reason, the wound measured slightly larger today but overall it still appears to be in good condition  with a nice base of granulation tissue and minimal slough. 06/07/2021: The wound is smaller today. Good granulation tissue and minimal slough. 06/14/2021: The wound is unchanged in size. She continues to accumulate some slough. Good granulation tissue on the surface. 06/20/2021: The wound is smaller today. Minimal slough with good granulation tissue. 06/27/2021: The wound on her left lateral leg is smaller today with just a bit of slough and eschar accumulation. Unfortunately, she has opened a new superficial wound on her right lower extremity. She has 3+ pitting edema to the knees on that leg and she does not wear compression stockings. 07/04/2021: In addition to the superficial wound on her right lower extremity that she opened up last week, she has opened 3 additional sites on the same leg. Her compression wraps were clearly not in correct position when she came to clinic today as they were at about the mid calf level rather than to the tibial tuberosity. She says that they slipped earlier and she had to come back on Friday to have them redone. The wound on her left lateral leg is perhaps slightly larger with some slough accumulation. 07/11/2021: The superficial wounds on her right lower extremity are nearly closed. They just have a thin layer of eschar overlying them. The wound on her left lateral leg is a little bit shallower and has a bit of slough accumulation. 07/17/2021: The right medial lower extremity leg wounds have almost completely closed; one of them just has a tiny opening with a little bit of serous drainage. The left lateral leg wound is really unchanged. It seems to be stalled. 07/24/2021: The right leg wounds are completely closed. The left lateral leg wound is actually bigger today.  It is a bit more tender. There is a minor accumulation of slough on the surface. 07/31/2021: The culture that I took last week was positive for MRSA. We added mupirocin under the silver alginate and I prescribed doxycycline. She has been tolerating this well. The wound is smaller today but does have slough accumulation. No significant pain or drainage. 08/07/2021: She completed her course of doxycycline. The wound looks much better today. It is smaller and the periwound is less inflamed. She does have some slough accumulation on the surface. 08/14/2021: The wound continues to contract. There is slough on the wound surface, but the periwound is intact without inflammation or induration. 08/21/2021: The wound is down to just 2 small open sites. They both have a bit of slough accumulation. 08/28/2021: The wound is down to just 1 small open site with a little bit of slough and eschar accumulation. 09/04/2021: The wound continues to contract but remains open. It is clean without any slough. 09/12/2021: No real change in the overall wound dimensions, but it is flush with the surrounding skin. There is a little bit of slough accumulation. Edema control is good. 09/22/2021: The wound is smaller and even more superficial. Minimal slough accumulation. Good control of edema. 09/29/2021: The wound continues to contract. She reports that it was a little bit "twingey" during the week. It is a little bit dry on inspection today. Light accumulation of slough. Good edema control. 10/06/2021: The wound is about the same size today. There is a little slough on the surface. Edema control is good. 10/13/2021: The wound is about the same size but more superficial. A little bit of slough on the surface. 10/23/2021: The wound is slightly smaller and continues to fill in. Minimal slough on the wound surface. 10/30/2021: The wound  continues to contract but has not yet closed. Light slough and eschar present. 11/06/2021: The wound persists,  but seems a little bit more epithelialized. There is still slough and eschar accumulation. [name], [name] ([id number]) [id number] Page 5 of 14 11/14/2021: The wound continues to contract but persists. Light eschar around the wound. 11/22/2021: The wound is down to just a pinhole opening. There is eschar overlying the surface. Edema control is excellent. 11/29/2021: Her wound is closed. READMISSION 01/22/2022 This is a patient to be discharged in October. She has severe chronic venous insufficiency at that time she had a wound on her left lower leg posteriorly also the right leg at some point. These closed. We discharged her in 30/40 mm stockings from elastic therapy which she has been wearing religiously. She tells [name] that she was traumatized by a dolly while shopping at [employer] about 2 to 3 weeks ago. She has been left with a left anterior lower leg wound. She comes in in another pair of stockings other than her 3040s. She does not have an arterial issue with her last ABI in the left at 1.2 12/18; this is a patient who has chronic venous insufficiency and recurrent venous insufficiency ulcers. She has a area on her left anterior lower leg and we readmitted her to the clinic last week. We use silver alginate and 4-layer compression. ABI was 1.2 She brought in her stocking which was a 20/30 below-knee stocking from elastic therapy. She may require a 30/40 mm equivalent stockings after this wound heals. 02/06/2022: The intake nurse reported an odor coming from the wound when she first unwrapped it but this abated after the leg was washed. There is thick layer of slough on the surface. 02/13/2022: The wound is cleaner today. It measured larger, but on visual inspection, I think some crust of Iodoflex was included in the wound measurements; the actual wound itself looks about the same to slightly smaller. Edema control is good. 02/20/2022: The wound is cleaner again today. It is  also measuring a little bit smaller, but there has been some moisture related periwound breakdown. Edema control is good. 02/27/2022: The intake nurse reported that the wound measures larger, but some of this ended up being crusted on Iodoflex. There is still some periwound moisture. Still with thick slough accumulation. Edema control is good. 03/06/2022: The wound is cleaner and more superficial, but did measure larger because of the inclusion of a satellite area. Still with some slough accumulation but less so than on prior visits. 03/13/2022: The wound measured a little bit smaller today. There is slough and eschar accumulation, as per usual. 03/20/2022: Her wound is larger and deeper today. The entire surface appears nonviable. There is more periwound erythema and threatened tissue breakdown. 03/27/2022: The culture that I took last week was positive for MRSA. We had empirically applied topical mupirocin to her wound. Today the wound is smaller and cleaner and less painful. There is still a layer of slough on the surface. 04/03/2022: The wound was measured slightly larger today, but appears roughly the same to my eye. There is a layer of slough on the surface. Underneath, the tissue is quite fibrotic. 04/10/2022: The wound is slightly smaller today. There is slough and eschar accumulation. For some reason, her wrap got changed from 4 layers to 3 layers and her edema control is not as good, as a result. 04/17/2022: The return to true 4-layer compression has improved the wound considerably. It is much smaller and cleaner today.  Edema control is excellent. 04/24/2022: Her wound continues to improve. It is smaller with just a bit of slough on the surface. Edema control is excellent. 05/08/2022: Her wound is smaller again today. There is still slough accumulation on the surface. Edema control remains excellent. 05/15/2022: The wound is substantially smaller this week. There is slough on the surface with excellent  edema control. 05/22/2022: Her wound is smaller again this week. Minimal slough on the surface. Good edema control. 05/29/2022: Once again, her wound is smaller. There is a bit of slough accumulation. Edema control remains excellent. 06/05/2022: Her wound is smaller again by about half. There is some slough and eschar buildup. Edema control is consistently excellent. 06/12/2022: The wound was initially measured larger by couple of millimeters, but upon debridement, much of this was found to be slough and eschar. The wound is actually about the same size. 06/18/2022: The wound is down to just a couple of millimeters, covered by eschar. 06/26/2022: Her wound is healed. 07/02/2022: Patient was seen today as part of the secondary prevention program. This is a 2-week follow-up visit. Her wound remains closed. She is wearing her compression stockings as advised. 08/31/2022: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 05/15/2022: The wound is substantially smaller this week. There is slough on the surface with excellent edema  control. 05/22/2022: Her wound is smaller again this week. Minimal slough on the surface. Good edema control. 05/29/2022: Once again, her wound is smaller. There is a bit of slough accumulation. Edema control remains excellent. 06/05/2022: Her wound is smaller again by about half. There is some slough and eschar buildup. Edema control is consistently excellent. 06/12/2022: The wound was initially measured larger by couple of millimeters, but upon debridement, much of this was found to be slough and eschar. The wound is actually about the same size. 06/18/2022: The wound is down to just a couple of millimeters, covered by eschar. 06/26/2022: Her wound is healed. 07/02/2022: Patient was seen today as part of the secondary prevention program. This is a 2-week follow-up visit. Her wound remains closed. She is wearing her compression stockings as advised. 08/31/2022: Returns with new wound, same site. Started as blister. Patient thinks compression stockings causing blisters. Patient History Information obtained from Patient. Allergies No Known Drug  Allergies Family History Heart Disease - Mother,Father, Hypertension - Mother,Father, Kidney Disease - Mother, Thyroid Problems - Mother, No family history of Cancer, Diabetes, Hereditary Spherocytosis, Lung Disease, Seizures, Stroke, Tuberculosis. Social History Never smoker, Marital Status - Single, Alcohol Use - Never, Drug Use - No History, Caffeine Use - Daily - coffee. Medical History Eyes Patient has history of Cataracts Hematologic/Lymphatic Patient has history of Lymphedema Cardiovascular Patient has history of Hypertension, Peripheral Venous Disease DEVONNE, KITCHEN ([id number]) [id number] Page 11 of 14 Integumentary (Skin) Denies history of History of Burn Musculoskeletal Patient has history of Osteoarthritis Hospitalization/Surgery History - colostomy reversal. 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 Integumentary (Hair, Skin) Wound #8 status is Open. Original cause of wound was Gradually Appeared. The date acquired was: 07/14/2022. The wound is located on the Left,Proximal,Anterior Lower Leg. The wound measures 2.5cm length x 2.5cm width x 0.3cm depth; 4.909cm^2 area and 1.473cm^3 volume. There is Fat Layer (Subcutaneous Tissue) exposed. There is no tunneling or undermining noted. There is a medium amount of serosanguineous drainage noted. The wound margin is distinct with the outline attached to the wound base. There is medium (34-66%) red, pink granulation within the wound bed. There is a medium (34-66%) amount of necrotic tissue within the wound bed including Adherent Slough. The periwound skin appearance had no abnormalities noted for texture. The periwound skin appearance had no abnormalities noted for color. General Notes: No photo taken as computers were down. Wound #9 status is Open. Original cause of wound was Gradually Appeared. The date acquired was: 07/14/2022. The wound is located on the [facility] Lower Leg. The wound measures 1.5cm length x 8cm width x 0.1cm depth; 9.425cm^2 area and 0.942cm^3 volume. There is Fat Layer (Subcutaneous Tissue) exposed. There is no tunneling or undermining noted. There is a medium amount of serosanguineous drainage noted. The wound margin is distinct with the outline attached to the wound base. There is medium (34-66%) red, pink granulation within the wound bed. There is a medium (34-66%) amount of necrotic tissue within the wound bed  including Adherent Slough. The periwound skin appearance had no abnormalities noted for texture. The periwound skin appearance had no abnormalities noted for color. The periwound skin appearance did not exhibit: Dry/Scaly, Maceration. General Notes: No photo taken as computers were down. 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 There was a Excisional Skin/Subcutaneous Tissue Debridement with a total area of 4.91 sq cm performed by [name], MD. With the following instrument(s): Curette Material removed includes Subcutaneous Tissue and Slough and after achieving pain control using Lidocaine 4% T opical Solution. No specimens were taken. A time out was conducted at 08:15, prior to the start of the procedure. A Minimum amount of bleeding was controlled with Pressure. The procedure was tolerated well with a pain level of 0 throughout and a pain level of 0 following the procedure. Post Debridement Measurements: 2.5cm length x 2.5cm width x  0.3cm depth; 1.473cm^3 volume. Character of Wound/Ulcer Post Debridement is stable. Severity of Tissue Post Debridement is: Fat layer exposed. Post procedure Diagnosis Wound #8: Same as Pre-Procedure General Notes: Scribed for Dr Lady Gary by Brenton Grills RN. Wound #9 Pre-procedure diagnosis of Wound #9 is a Vasculitis located on the Left,Distal,Anterior Lower Leg . There was a Selective/Open Wound Non-Viable Tissue Debridement with a total area of 9.42 sq cm performed by [name], MD. With the following instrument(s): Curette Material removed includes Eschar and Slough and after achieving pain control using  Lidocaine 4% Topical Solution. No specimens were taken. A time out was conducted at 08:15, prior to the start of the procedure. A Minimum amount of bleeding was controlled with Pressure. The procedure was tolerated well with a pain level of 0 throughout and a pain level of 0 following the procedure. Post Debridement Measurements: 1.5cm length x 8cm width x 0.1cm depth; 0.942cm^3 volume. Character of Wound/Ulcer Post Debridement is stable. Post procedure Diagnosis Wound #9: Same as Pre-Procedure General Notes: Scribed for Dr Lady Gary by Brenton Grills RN. JANEECE, BLOK ([id number]) [id number] Page 12 of 14 Plan Follow-up Appointments: Return Appointment in 1 week. Discharge From [facility] Services: Discharge from Wound Care Center - Please keep wearing compression stockings. Anesthetic: (In clinic) Topical Lidocaine 4% applied to wound bed Bathing/ Shower/ Hygiene: May shower with protection but do not get wound dressing(s) wet. Protect dressing(s) with water repellant cover (for example, large plastic bag) or a cast cover and may then take shower. WOUND #8: - Lower Leg Wound Laterality: Left, Anterior, Proximal Cleanser: Vashe 5.8 (oz) 1 x Per Day/30 Days Discharge Instructions: Cleanse the wound with Vashe prior to applying a clean dressing using gauze sponges, not tissue or cotton balls. Topical: Mupirocin Ointment 1 x Per Day/30 Days Discharge Instructions: Apply Mupirocin (Bactroban) as instructed Prim Dressing: Maxorb Extra Ag+ Alginate Dressing, 4x4.75 (in/in) 1 x Per Day/30 Days ary Discharge Instructions: Apply to wound bed as instructed Com pression Wrap: FourPress (4 layer compression wrap) 1 x Per Day/30 Days Discharge Instructions: Apply four layer compression as directed. May also use Urgo K2 compression system as alternative. WOUND #9: - Lower Leg Wound Laterality: Left, Anterior, Distal Cleanser: Vashe 5.8 (oz) 1 x Per Day/30 Days Discharge  Instructions: Cleanse the wound with Vashe prior to applying a clean dressing using gauze sponges, not tissue or cotton balls. Topical: Mupirocin Ointment 1 x Per Day/30 Days Discharge Instructions: Apply Mupirocin (Bactroban) as instructed Prim Dressing: Maxorb Extra Ag+ Alginate Dressing, 4x4.75 (in/in) 1 x Per Day/30 Days ary Discharge Instructions: Apply to wound bed as instructed Com pression Wrap: FourPress (4 layer compression wrap) 1 x Per Day/30 Days Discharge Instructions: Apply four layer compression as directed. May also use Urgo K2 compression system as alternative. Recurrent Venous ulcer. 1. Debrided slough and SubQ from large wound, slough and eschar from small cluster. 2. Mupirocin, Alignate Ag, and 4 layer compression wrap. 3. Order Juxtalite/farrow wrap. 4. F/u x1 week. Electronic Signature(s) Signed: 09/10/2022 9:15:38 AM By: [name] MD FACS Previous Signature: 09/07/2022 4:29:32 PM Version By: Shawn Stall RN, BSN Entered By: [name] on 09/10/2022 09:15:38 -------------------------------------------------------------------------------- HxROS Details Patient Name: Date of Service: [name] 08/31/2022 8:00 A M Medical Record Number: [id number] Patient Account Number: [id number] Date of Birth/Sex: Treating RN: [birth year] (67 y.o. Gevena Mart Primary Care Provider: [name] Other Clinician: Referring Provider: Treating Provider/Extender: Jasmine Pang in Treatment: 0 Information Obtained From Patient Constitutional Symptoms (  General Health) Medical History: Past Medical History Notes: morbid [address] LEXYS, MILLINER ([id number]) [id number] Page 13 of 14 Medical History: Positive for: Cataracts Hematologic/Lymphatic Medical History: Positive for: Lymphedema Cardiovascular Medical History: Positive for: Hypertension; Peripheral Venous Disease Past Medical History  Notes: schamberg disease, hyperlipidemia Gastrointestinal Medical History: Past Medical History Notes: diverticulitis , h/o obstruction due to diverticulitis , colostomy and colostomy reversal Endocrine Medical History: Past Medical History Notes: hypothyroidism Integumentary (Skin) Medical History: Negative for: History of Burn Musculoskeletal Medical History: Positive for: Osteoarthritis HBO Extended History Items Eyes: Cataracts Immunizations Pneumococcal Vaccine: Received Pneumococcal Vaccination: Yes Received Pneumococcal Vaccination On or After 60th Birthday: Yes Implantable Devices None Hospitalization / Surgery History Type of Hospitalization/Surgery colostomy reversal 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 Family and Social History Cancer: No; Diabetes: No; Heart Disease: Yes - Mother,Father; Hereditary Spherocytosis: No; Hypertension: Yes - Mother,Father; Kidney Disease: Yes - Mother; Lung Disease: No; Seizures: No; Stroke: No; Thyroid Problems: Yes - Mother; Tuberculosis: No; Never smoker; Marital Status - Single; Alcohol Use: Never; Drug Use: No History; Caffeine Use: Daily - coffee; Financial Concerns: No; Food, Clothing or Shelter Needs: No; Support System Lacking: No; Transportation Concerns: No Electronic Signature(s) Signed: 09/03/2022 11:13:49 AM By: [name] MD FACS Signed: 09/05/2022 11:11:00 AM By: Brenton Grills Entered By: Brenton Grills on 08/30/2022 10:15:10 SuperBill Details -------------------------------------------------------------------------------- Martie Round ([id number]) [id number] Page 14 of 14 Patient Name: Date of Service: JOLEENA, [place]IS[place] 08/31/2022 Medical Record Number: [id number] Patient Account Number: [id number] Date of Birth/Sex: Treating RN: 1955-07-13 (67 y.o. 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 [id number] - DEBRIDE WOUND 1ST 20 SQ CM OR < 1 ICD-10 Diagnosis Description L97.928 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 Physician Procedures Quantity CPT4 Code Description Modifier [id number] - WC PHYS LEVEL 4 - EST PT 25 1 ICD-10 Diagnosis Description L97.928 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 [id number] 11042 - WC PHYS SUBQ TISS 20 SQ CM 1 ICD-10 Diagnosis Description L97.928 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

## 2022-09-10 ENCOUNTER — Encounter (HOSPITAL_BASED_OUTPATIENT_CLINIC_OR_DEPARTMENT_OTHER): Payer: Commercial Managed Care - PPO | Admitting: General Surgery

## 2022-09-10 DIAGNOSIS — I87312 Chronic venous hypertension (idiopathic) with ulcer of left lower extremity: Secondary | ICD-10-CM | POA: Diagnosis not present

## 2022-09-11 NOTE — Progress Notes (Signed)
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 same to my eye. There is a layer of slough on the surface. Underneath, the tissue is quite fibrotic. 04/10/2022: The wound is slightly smaller today. There is slough and eschar  accumulation. For some reason, her wrap got changed from 4 layers to 3 layers and her edema control is not as good, as a result. 04/17/2022: The return to true 4-layer compression has improved the wound considerably. It is much smaller and cleaner today. Edema control is excellent. 04/24/2022: Her wound continues to improve. It is smaller with just a bit of slough on the surface. Edema control is excellent. 05/08/2022: Her wound is smaller again today. There is still slough accumulation on the surface. Edema control remains excellent. 05/15/2022: The wound is substantially smaller this week. There is slough on the surface with excellent edema control. 05/22/2022: Her wound is smaller again this week. Minimal slough on the surface. Good edema control. 05/29/2022: Once again, her wound is smaller. There is a bit of slough accumulation. Edema control remains excellent. 06/05/2022: Her wound is smaller again by about half. There is some slough and eschar buildup. Edema control is  consistently excellent. 06/12/2022: The wound was initially measured larger by couple of millimeters, but upon debridement, much of this was found to be slough and eschar. The wound is actually about the same size. 06/18/2022: The wound is down to just a couple of millimeters, covered by eschar. 06/26/2022: Her wound is healed. 07/02/2022: Patient was seen today as part of the secondary prevention program. This is a 2-week follow-up visit. Her wound remains closed. She is wearing her compression stockings as advised. 08/31/2022: Returns with new wound, same site. Started as blister. Patient thinks compression stockings causing blisters. 09/10/2022: The small distal cluster of wounds has closed. She has a new wound proximal to the main wound. It is superficial and limited to breakdown of skin and appears secondary to friction. The main wound has a fibrotic surface with slough accumulation. Edema control is acceptable. [name], [name]  ([id number]) [id number] Page 11 of 15 Patient History Information obtained from Patient. Family History Heart Disease - Mother,Father, Hypertension - Mother,Father, Kidney Disease - Mother, Thyroid Problems - Mother, No family history of Cancer, Diabetes, Hereditary Spherocytosis, Lung Disease, Seizures, Stroke, Tuberculosis. Social History Never smoker, Marital Status - Single, Alcohol Use - Never, Drug Use - No History, Caffeine Use - Daily - coffee. Medical History Eyes Patient has history of Cataracts Hematologic/Lymphatic Patient has history of Lymphedema Cardiovascular Patient has history of Hypertension, Peripheral Venous Disease Integumentary (Skin) Denies history of History of Burn Musculoskeletal Patient has history of Osteoarthritis Hospitalization/Surgery History - colostomy reversal. 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 Medical A Surgical History Notes nd Constitutional Symptoms (General Health) morbid obesity Cardiovascular schamberg disease, hyperlipidemia Gastrointestinal diverticulitis , h/o obstruction due to diverticulitis , colostomy and colostomy reversal Endocrine hypothyroidism Objective Constitutional Slightly hypertensive. no acute distress. Vitals Time Taken: 9:44 AM, Height: 62 in, Weight: 221 lbs, BMI: 40.4, Temperature: 98.3 F, Pulse: 90 bpm, Respiratory Rate: 18 breaths/min, Blood Pressure: 145/79 mmHg. Respiratory Normal work of breathing on room air. General Notes: 09/10/2022: The small distal cluster of wounds has closed. She has a new wound proximal to the main wound. It is superficial and limited to breakdown of skin and appears secondary to friction. The main wound has a fibrotic surface with slough accumulation. Edema control is acceptable. Integumentary (Hair, Skin) Wound #8 status is Open. Original cause of wound was Gradually Appeared. The date acquired was: 07/14/2022. The wound has been in treatment 1 weeks.  The wound is located on the Left,Proximal,Anterior Lower Leg. The wound measures 2.1cm length x 2.3cm width x 0.1cm depth; 3.793cm^2 area and 0.379cm^3 volume. There is Fat Layer (Subcutaneous Tissue) exposed. There is no tunneling or undermining noted. There is a medium amount of serosanguineous drainage noted. The wound margin is distinct with the outline attached to the wound base. There is large (67-100%) red granulation within the wound bed. There is a small (1-33%) amount of necrotic tissue within the wound bed including Adherent Slough. The periwound skin appearance had no abnormalities noted for texture. The periwound skin appearance had no abnormalities noted for color. Wound #9 status is Open. Original cause of wound was Gradually Appeared. The date acquired was: 07/14/2022. The wound has been in treatment 1 weeks. The wound is located on the [facility] Lower Leg. The wound measures 2.2cm length x 2.2cm width x 0.2cm depth; 3.801cm^2 area and 0.76cm^3 volume. There is Fat Layer (Subcutaneous Tissue) exposed. There is no tunneling or undermining noted. There is a medium amount of serosanguineous drainage noted. The wound margin is distinct with the  outline attached to the wound base. There is small (1-33%) pink granulation within the wound bed. There is a large (67- 100%) amount of necrotic tissue within the wound bed including Eschar and Adherent Slough. The periwound skin appearance had no abnormalities noted for texture. The periwound skin appearance had no abnormalities noted for color. The periwound skin appearance did not exhibit: Dry/Scaly, Maceration. 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 There was a Excisional Skin/Subcutaneous Tissue Debridement with a total area of 3.79 sq cm performed by [name], MD. With the following instrument(s): Curette to remove Non-Viable tissue/material. Material removed includes Subcutaneous Tissue and Slough and after achieving pain control using Lidocaine 5% topical ointment. No specimens were taken. A time out was conducted at 10:16, prior to the start of the procedure. A Minimum amount of bleeding was controlled with Pressure. The procedure was tolerated well with a pain level of 0 throughout and a pain level of 0 following the procedure. Post Debridement Measurements: 2.1cm length x 2.3cm width x 0.1cm depth; 0.379cm^3 volume. Character of Wound/Ulcer Post Debridement is improved. Severity of Tissue Post Debridement is: Fat layer exposed. Post procedure Diagnosis Wound #8: Same as Pre-Procedure General Notes: Scribed for Dr. Lady Gary by J.[name]. Pre-procedure diagnosis of Wound #8 is a Venous Leg Ulcer located on the Left,Proximal,Anterior Lower Leg . There was a Four Layer Compression Therapy Procedure by Karie Schwalbe, RN. Post procedure Diagnosis Wound #8: Same as Pre-Procedure Wound #9 Pre-procedure diagnosis of Wound #9 is a Vasculitis located on the Left,Distal,Anterior Lower Leg . There was a Selective/Open Wound Non-Viable Tissue Debridement with a total area of 3.8 sq cm performed by [name], MD. With the following instrument(s): Curette to remove Non-Viable tissue/material. Material removed includes Eschar and Slough and after achieving pain control using Lidocaine 5% topical ointment. No specimens were taken. A time out was conducted at 10:16, prior to the start of the procedure. A Minimum amount of bleeding was controlled with Pressure. The procedure was tolerated well  with a pain level of 0 throughout and a pain level of 0 following the procedure. Post Debridement Measurements: 2.2cm length x 2.2cm width x 0.2cm depth; 0.76cm^3 volume. Character of Wound/Ulcer Post Debridement is improved. Post procedure Diagnosis Wound #9: Same as Pre-Procedure General Notes: Scribed for Dr. Lady Gary by J.[name]. 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 There was a Four Layer Compression Therapy Procedure by Karie Schwalbe, RN. Post procedure Diagnosis Wound #9: Same as Pre-Procedure Plan Follow-up Appointments: Return Appointment in 1 week. - Dr. Nada Maclachlan 09/17/22 at 11:30am Room 2 ; Mon 10/01/22 9:15am Room 3 ; 10/08/22 at 9:30am Room 3 Nurse Visit: - Monday 09/24/22 at 9:15am Room 6 Discharge From [facility] Services: Discharge from Wound Care Center - Please keep wearing compression stockings. Anesthetic: (In clinic) Topical Lidocaine 4% applied to wound bed Bathing/ Shower/ Hygiene: May shower with protection but do not get wound dressing(s) wet. Protect dressing(s) with water repellant cover (for example, large plastic bag) or a cast cover and may then take shower. WOUND #8: - Lower Leg Wound Laterality: Left, Anterior, Proximal Cleanser: Vashe 5.8 (oz)  1 x Per Day/30 Days Discharge Instructions: Cleanse the wound with Vashe prior to applying a clean dressing using gauze sponges, not tissue or cotton balls. Topical: Mupirocin Ointment 1 x Per Day/30 Days Discharge Instructions: Apply Mupirocin (Bactroban) as instructed Prim Dressing: Maxorb Extra Ag+ Alginate Dressing, 4x4.75 (in/in) 1 x Per Day/30 Days ary Discharge Instructions: Apply to wound bed as instructed Com pression Wrap: FourPress (4 layer compression wrap) 1 x Per Day/30 Days Discharge Instructions: Apply four layer compression as directed. May also use Urgo K2 compression system as alternative. WOUND #9: - Lower Leg Wound Laterality: Left, Anterior,  Distal Cleanser: Vashe 5.8 (oz) 1 x Per Day/30 Days Discharge Instructions: Cleanse the wound with Vashe prior to applying a clean dressing using gauze sponges, not tissue or cotton balls. Topical: Mupirocin Ointment 1 x Per Day/30 Days Discharge Instructions: Apply Mupirocin (Bactroban) as instructed Prim Dressing: Maxorb Extra Ag+ Alginate Dressing, 4x4.75 (in/in) 1 x Per Day/30 Days ary Discharge Instructions: Apply to wound bed as instructed Com pression Wrap: FourPress (4 layer compression wrap) 1 x Per Day/30 Days Discharge Instructions: Apply four layer compression as directed. May also use Urgo K2 compression system as alternative. 09/10/2022: The small distal cluster of wounds has closed. She has a new wound proximal to the main wound. It is superficial and limited to breakdown of skin and appears secondary to friction. The main wound has a fibrotic surface with slough accumulation. Edema control is acceptable. I used a curette to debride slough and eschar from the new proximal wound and slough and subcutaneous tissue from the main primary wound. We will continue mupirocin with silver alginate and Urgo 4-layer compression equivalent. Follow-up in 1 week. Electronic Signature(s) Signed: 09/11/2022 6:21:10 PM By: Shawn Stall RN, BSN Signed: 09/12/2022 7:48:01 AM By: [name] MD FACS Previous Signature: 09/10/2022 10:25:54 AM Version By: [name] MD FACS Entered By: Shawn Stall on 09/11/2022 17:00:16 MLYNN, WEINKAUF [name] ([id number]) [id number] Page 13 of 15 -------------------------------------------------------------------------------- HxROS Details Patient Name: Date of Service: CHELESA, BRANNUM 09/10/2022 9:30 A M Medical Record Number: [id number] Patient Account Number: [id number] Date of Birth/Sex: Treating RN: [birth year] (67 y.o. F) Primary Care Provider: Dorothyann [name] Other Clinician: Referring Provider: Treating Provider/Extender:  [name] in Treatment: 1 Information Obtained From Patient Constitutional Symptoms (General Health) Medical History: Past Medical History Notes: morbid obesity Eyes Medical History: Positive for: Cataracts Hematologic/Lymphatic Medical History: Positive for: Lymphedema Cardiovascular Medical History: Positive for: Hypertension; Peripheral Venous Disease Past Medical History Notes: schamberg disease, hyperlipidemia Gastrointestinal Medical History: Past Medical History Notes: diverticulitis , h/o obstruction due to diverticulitis , colostomy and colostomy reversal Endocrine Medical History: Past Medical History Notes: hypothyroidism Integumentary (Skin) Medical History: Negative for: History of Burn Musculoskeletal Medical History: Positive for: Osteoarthritis HBO Extended History Items Eyes: Cataracts Immunizations Pneumococcal Vaccine: Received Pneumococcal Vaccination: Yes Received Pneumococcal Vaccination On or After 60th Birthday: Yes Implantable Devices None IYONNA, LOWNEY [name] ([id number]) [id number] Page 14 of 15 Hospitalization / Surgery History Type of Hospitalization/Surgery colostomy reversal 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 Family and Social History Cancer: No; Diabetes: No; Heart Disease: Yes - Mother,Father; Hereditary Spherocytosis: No; Hypertension: Yes - Mother,Father; Kidney Disease: Yes - Mother; Lung Disease: No; Seizures: No; Stroke: No; Thyroid Problems: Yes - Mother; Tuberculosis: No; Never smoker; Marital Status - Single; Alcohol Use: Never; Drug Use: No History; Caffeine Use: Daily - coffee; Financial Concerns: No; Food, Clothing or Shelter Needs: No; Support System Lacking: No; Transportation Concerns: No Electronic Signature(s) Signed: 09/10/2022  10:26:58 AM By: [name] MD FACS Entered By: [name] on 09/10/2022  10:24:25 -------------------------------------------------------------------------------- SuperBill Details Patient Name: Date of Service: [name] [name] 09/10/2022 Medical Record Number: [id number] Patient Account Number: [id number] Date of Birth/Sex: Treating RN: [birth year] (67 y.o. 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 with other specif Modifier: ied severity Quantity: 1 : CPT4 Code: 56387564 Description: 97597 - DEBRIDE WOUND 1ST 20 SQ CM OR < ICD-10 Diagnosis Description L97.928 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 with other specif Modifier: ied severity Quantity: 1 Physician Procedures : CPT4 Code Description Modifier [id number] 99214 - WC PHYS LEVEL 4 - EST PT 25 ICD-10 Diagnosis Description L97.928 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 I87.312 Chronic venous hypertension  (idiopathic) with ulcer of left lower extremity Quantity: 1 : [id number] 11042 - WC PHYS SUBQ TISS 20 SQ CM ICD-10 Diagnosis Description L97.928 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

## 2022-09-11 NOTE — Progress Notes (Signed)
JENEVIEVE, CLINESMITH ([id number]) [id number] Page 1 of 9 Visit Report for 09/10/2022 Arrival Information Details Patient Name: Date of Service: CHIQUETA, LOTTI 09/10/2022 9:30 A M Medical Record Number: [id number] Patient Account Number: [id number] Date of Birth/Sex: Treating RN: 08-Oct-1955 (67 y.o. F) Primary Care Elvis Boot: [name] Other Clinician: Referring Yoseline Andersson: Treating Giovannina Mun/Extender: Jasmine Pang in Treatment: 1 Visit Information History Since Last Visit All ordered tests and consults were completed: No Patient Arrived: [name] Added or deleted any medications: No Arrival Time: 09:44 Any new allergies or adverse reactions: No Accompanied By: self Had a fall or experienced change in No Transfer Assistance: None activities of daily living that may affect Patient Identification Verified: Yes risk of falls: Secondary Verification Process Completed: Yes Signs or symptoms of abuse/neglect since last visito No Patient Requires Transmission-Based Precautions: No Hospitalized since last visit: No Patient Has Alerts: No Implantable device outside of the clinic excluding No cellular tissue based products placed in the center since last visit: Pain Present Now: No Electronic Signature(s) Signed: 09/10/2022 1:18:11 PM By: Dayton Scrape Entered By: Dayton Scrape on 09/10/2022 09:44:46 -------------------------------------------------------------------------------- Compression Therapy Details Patient Name: Date of Service: ROHNDA, TRIMARCHI D. 09/10/2022 9:30 A M Medical Record Number: [id number] Patient Account Number: [id number] Date of Birth/Sex: Treating RN: 11-Jun-1955 (67 y.o. [name] Primary Care Meril Dray: [name] Other Clinician: Referring Megan Hayduk: Treating Hernandez Losasso/Extender: Jasmine Pang in Treatment: 1 Compression Therapy Performed for Wound Assessment: Wound #8  Left,Proximal,Anterior Lower Leg Performed By: Clinician Karie Schwalbe, RN Compression Type: Four Layer Post Procedure Diagnosis Same as Pre-procedure Electronic Signature(s) Signed: 09/10/2022 5:29:10 PM By: Karie Schwalbe RN Entered By: Karie Schwalbe on 09/10/2022 10:17:57 AANCHAL, URBIN D ([id number]) [id number] Page 2 of 9 -------------------------------------------------------------------------------- Compression Therapy Details Patient Name: Date of Service: FIDELA, UHLENHAKE 09/10/2022 9:30 A M Medical Record Number: [id number] Patient Account Number: [id number] Date of Birth/Sex: Treating RN: October 26, 1955 (67 y.o. [name] Primary Care Aarib Pulido: [name] Other Clinician: Referring Caledonia Zou: Treating Zonnique Norkus/Extender: Jasmine Pang in Treatment: 1 Compression Therapy Performed for Wound Assessment: Wound #9 Left,Distal,Anterior Lower Leg Performed By: Clinician Karie Schwalbe, RN Compression Type: Four Layer Post Procedure Diagnosis Same as Pre-procedure Electronic Signature(s) Signed: 09/10/2022 5:29:10 PM By: Karie Schwalbe RN Entered By: Karie Schwalbe on 09/10/2022 10:17:57 -------------------------------------------------------------------------------- Encounter Discharge Information Details Patient Name: Date of Service: [name] 09/10/2022 9:30 A M Medical Record Number: [id number] Patient Account Number: [id number] Date of Birth/Sex: Treating RN: 25-Jul-1955 (67 y.o. [name] Primary Care Tyneisha Hegeman: [name] Other Clinician: Referring Giovana Faciane: Treating Leshea Jaggers/Extender: Jasmine Pang in Treatment: 1 Encounter Discharge Information Items Post Procedure Vitals Discharge Condition: Stable Temperature (F): 98.3 Ambulatory Status: Ambulatory Pulse (bpm): 90 Discharge Destination: Home Respiratory Rate (breaths/min): 18 Transportation: Private  Auto Blood Pressure (mmHg): 145/79 Accompanied By: self Schedule Follow-up Appointment: Yes Clinical Summary of Care: Patient Declined Electronic Signature(s) Signed: 09/10/2022 5:29:10 PM By: Karie Schwalbe RN Entered By: Karie Schwalbe on 09/10/2022 11:50:43 -------------------------------------------------------------------------------- Lower Extremity Assessment Details Patient Name: Date of Service: ANNESHA, LITTS 09/10/2022 9:30 A M Medical Record Number: [id number] Patient Account Number: [id number] Date of Birth/Sex: Treating RN: 24-May-1955 (67 y.o. [name] Primary Care Torion Hulgan: [name] Other Clinician: Referring Ahnaf Caponi: Treating Khaalid Lefkowitz/Extender: Jasmine Pang in Treatment: 1 Edema Assessment Assessed: [Left: No] Franne Forts: No] [Left: Edema] [Right: :] MONIECE, DEVANY D ([id number]) [id number] Page 3 of 9 Left: Right:  Point of Measurement: 34 cm From Medial Instep 46 cm 45.5 cm Ankle Left: Right: Point of Measurement: 9 cm From Medial Instep 22 cm 23 cm Knee To Floor Left: Right: From Medial Instep 40 cm 40 cm Vascular Assessment Pulses: Dorsalis Pedis Palpable: [Left:Yes] Extremity colors, hair growth, and conditions: Hair Growth on Extremity: [Left:No] [Right:No] Temperature of Extremity: [Left:Warm] [Right:Warm] Capillary Refill: [Left:< 3 seconds] [Right:< 3 seconds] Dependent Rubor: [Left:No No] [Right:No No] Toe Nail Assessment Left: Right: Thick: Yes Discolored: No Deformed: No Improper Length and Hygiene: No Electronic Signature(s) Signed: 09/10/2022 5:29:10 PM By: Karie Schwalbe RN Entered By: Karie Schwalbe on 09/10/2022 10:03:15 -------------------------------------------------------------------------------- Multi Wound Chart Details Patient Name: Date of Service: [name] 09/10/2022 9:30 A M Medical Record Number: [id number] Patient Account Number:  [id number] Date of Birth/Sex: Treating RN: 03-Apr-1955 (67 y.o. F) Primary Care Nykia Turko: [name] Other Clinician: Referring Burl Tauzin: Treating Otilio Groleau/Extender: Jasmine Pang in Treatment: 1 Vital Signs Height(in): 62 Pulse(bpm): 90 Weight(lbs): 221 Blood Pressure(mmHg): 145/79 Body Mass Index(BMI): 40.4 Temperature(F): 98.3 Respiratory Rate(breaths/min): 18 [8:Photos:] [N/A:N/A] Left, Proximal, Anterior Lower Leg Left, Distal, Anterior Lower Leg N/A Wound Location: Gradually Appeared Gradually Appeared N/A Wounding Event: Venous Leg Ulcer Vasculitis N/A Primary EtiologyTAIA, BACH ([id number]) [id number] Page 4 of 9 Cataracts, Lymphedema, Cataracts, Lymphedema, N/A Comorbid History: Hypertension, Peripheral Venous Hypertension, Peripheral Venous Disease, Osteoarthritis Disease, Osteoarthritis 07/14/2022 07/14/2022 N/A Date Acquired: 1 1 N/A Weeks of Treatment: Open Open N/A Wound Status: No No N/A Wound Recurrence: 2.1x2.3x0.1 2.2x2.2x0.2 N/A Measurements L x W x D (cm) 3.793 3.801 N/A A (cm) : rea 0.379 0.76 N/A Volume (cm) : 22.70% 59.70% N/A % Reduction in A rea: 74.30% 19.30% N/A % Reduction in Volume: Full Thickness Without Exposed Full Thickness Without Exposed N/A Classification: Support Structures Support Structures Medium Medium N/A Exudate A mount: Serosanguineous Serosanguineous N/A Exudate Type: red, brown red, brown N/A Exudate Color: Distinct, outline attached Distinct, outline attached N/A Wound Margin: Large (67-100%) Small (1-33%) N/A Granulation A mount: Red Pink N/A Granulation Quality: Small (1-33%) Large (67-100%) N/A Necrotic A mount: Adherent Slough Eschar, Adherent Slough N/A Necrotic Tissue: Fat Layer (Subcutaneous Tissue): Yes Fat Layer (Subcutaneous Tissue): Yes N/A Exposed Structures: Small (1-33%) Small (1-33%) N/A Epithelialization: Debridement - Excisional  Debridement - Selective/Open Wound N/A Debridement: Pre-procedure Verification/Time Out 10:16 10:16 N/A Taken: Lidocaine 5% topical ointment Lidocaine 5% topical ointment N/A Pain Control: Subcutaneous, Slough Necrotic/Eschar, [employer] N/A Tissue Debrided: Skin/Subcutaneous Tissue Non-Viable Tissue N/A Level: 3.79 3.8 N/A Debridement A (sq cm): rea Curette Curette N/A Instrument: Minimum Minimum N/A Bleeding: Pressure Pressure N/A Hemostasis A chieved: 0 0 N/A Procedural Pain: 0 0 N/A Post Procedural Pain: Procedure was tolerated well Procedure was tolerated well N/A Debridement Treatment Response: 2.1x2.3x0.1 2.2x2.2x0.2 N/A Post Debridement Measurements L x W x D (cm) 0.379 0.76 N/A Post Debridement Volume: (cm) No Abnormalities Noted No Abnormalities Noted N/A Periwound Skin Texture: Maceration: No N/A Periwound Skin Moisture: Dry/Scaly: No No Abnormalities Noted No Abnormalities Noted N/A Periwound Skin Color: Compression Therapy Compression Therapy N/A Procedures Performed: Debridement Treatment Notes Electronic Signature(s) Signed: 09/10/2022 10:23:17 AM By: [name] MD FACS Entered By: [name] on 09/10/2022 10:23:17 -------------------------------------------------------------------------------- Multi-Disciplinary Care Plan Details Patient Name: Date of Service: [name] 09/10/2022 9:30 A M Medical Record Number: [id number] Patient Account Number: [id number] Date of Birth/Sex: Treating RN: February 07, 1956 (67 y.o. [name] Primary Care Gaynelle Pastrana: [name] Other Clinician: Referring Hatem Cull: Treating Devanta Daniel/Extender: Shirlean Schlein,  Robyn Weeks in Treatment: 1 Active Inactive Wound/Skin Impairment Nursing Diagnoses: Impaired tissue integrity GoalsRAYVEN, SENNOTT ([id number]) [id number] Page 5 of 9 Patient/caregiver will verbalize understanding of skin care regimen Date  Initiated: 09/03/2022 Target Resolution Date: 11/12/2022 Goal Status: Active Interventions: Assess patient/caregiver ability to obtain necessary supplies Assess patient/caregiver ability to perform ulcer/skin care regimen upon admission and as needed Assess ulceration(s) every visit Provide education on ulcer and skin care Screen for HBO Treatment Activities: Skin care regimen initiated : 08/31/2022 Topical wound management initiated : 08/31/2022 Notes: Electronic Signature(s) Signed: 09/10/2022 5:29:10 PM By: Karie Schwalbe RN Entered By: Karie Schwalbe on 09/10/2022 11:39:25 -------------------------------------------------------------------------------- Pain Assessment Details Patient Name: Date of Service: [name] 09/10/2022 9:30 A M Medical Record Number: [id number] Patient Account Number: [id number] Date of Birth/Sex: Treating RN: [birth year] (67 y.o. F) Primary Care Glenard Keesling: [name] Other Clinician: Referring Bryttney Netzer: Treating Haylen Bellotti/Extender: Jasmine Pang in Treatment: 1 Active Problems Location of Pain Severity and Description of Pain Patient Has Paino No Site Locations Pain Management and Medication Current Pain Management: Electronic Signature(s) Signed: 09/10/2022 1:18:11 PM By: Dayton Scrape Entered By: Dayton Scrape on 09/10/2022 09:45:36 Crisanti, Nancey D ([id number]) [id number] Page 6 of 9 -------------------------------------------------------------------------------- Patient/Caregiver Education Details Patient Name: Date of Service: AVIANNA, KAY 7/29/2024andnbsp9:30 A M Medical Record Number: [id number] Patient Account Number: [id number] Date of Birth/Gender: Treating RN: Nov 16, 1955 (67 y.o. [name] Primary Care Physician: [name] Other Clinician: Referring Physician: Treating Physician/Extender: Jasmine Pang in Treatment: 1 Education  Assessment Education Provided To: Patient Education Topics Provided Wound/Skin Impairment: Methods: Explain/Verbal Responses: Return demonstration correctly Electronic Signature(s) Signed: 09/10/2022 5:29:10 PM By: Karie Schwalbe RN Entered By: Karie Schwalbe on 09/10/2022 11:39:46 -------------------------------------------------------------------------------- Wound Assessment Details Patient Name: Date of Service: HOLLE, SHOVER D. 09/10/2022 9:30 A M Medical Record Number: [id number] Patient Account Number: [id number] Date of Birth/Sex: Treating RN: Jan 09, 1956 (67 y.o. [name] Primary Care Wilmarie Sparlin: [name] Other Clinician: Referring Daimien Patmon: Treating Natha Guin/Extender: Jasmine Pang in Treatment: 1 Wound Status Wound Number: 8 Primary Venous Leg Ulcer Etiology: Wound Location: Left, Proximal, Anterior Lower Leg Wound Open Wounding Event: Gradually Appeared Status: Date Acquired: 07/14/2022 Comorbid Cataracts, Lymphedema, Hypertension, Peripheral Venous Weeks Of Treatment: 1 History: Disease, Osteoarthritis Clustered Wound: No Photos Wound Measurements Length: (cm) 2. Width: (cm) 2. JOHNENE, GRANADE ([id number]) Depth: (cm) 0 Area: (cm) Volume: (cm) 1 % Reduction in Area: 22.7% 3 % Reduction in Volume: 74.3% [id number] Page 7 of 9 .1 Epithelialization: Small (1-33%) 3.793 Tunneling: No 0.379 Undermining: No Wound Description Classification: Full Thickness Without Exposed Support Structures Wound Margin: Distinct, outline attached Exudate Amount: Medium Exudate Type: Serosanguineous Exudate Color: red, brown Foul Odor After Cleansing: No Slough/Fibrino Yes Wound Bed Granulation Amount: Large (67-100%) Exposed Structure Granulation Quality: Red Fat Layer (Subcutaneous Tissue) Exposed: Yes Necrotic Amount: Small (1-33%) Necrotic Quality: Adherent Slough Periwound Skin Texture Texture  Color No Abnormalities Noted: Yes No Abnormalities Noted: Yes Moisture No Abnormalities Noted: No Treatment Notes Wound #8 (Lower Leg) Wound Laterality: Left, Anterior, Proximal Cleanser Vashe 5.8 (oz) Discharge Instruction: Cleanse the wound with Vashe prior to applying a clean dressing using gauze sponges, not tissue or cotton balls. Peri-Wound Care Topical Mupirocin Ointment Discharge Instruction: Apply Mupirocin (Bactroban) as instructed Primary Dressing Maxorb Extra Ag+ Alginate Dressing, 4x4.75 (in/in) Discharge Instruction: Apply to wound bed as instructed Secondary Dressing Secured With Compression Wrap FourPress (4 layer compression wrap) Discharge Instruction: Apply four  layer compression as directed. May also use Urgo K2 compression system as alternative. Compression Stockings Add-Ons Electronic Signature(s) Signed: 09/10/2022 5:29:10 PM By: Karie Schwalbe RN Entered By: Karie Schwalbe on 09/10/2022 10:10:03 -------------------------------------------------------------------------------- Wound Assessment Details Patient Name: Date of Service: ALEISE, TOOLAN D. 09/10/2022 9:30 A M Medical Record Number: [id number] Patient Account Number: [id number] Date of Birth/Sex: Treating RN: 24-Oct-1955 (67 y.o. [name] Primary Care Clifford Coudriet: [name] Other Clinician: Referring Heleena Miceli: Treating Karem Farha/Extender: Jasmine Pang in Treatment: 1 Wound Status MOSE, SELVIDGE D ([id number]) [id number] Page 8 of 9 Wound Number: 9 Primary Vasculitis Etiology: Wound Location: Left, Distal, Anterior Lower Leg Wound Open Wounding Event: Gradually Appeared Status: Date Acquired: 07/14/2022 Comorbid Cataracts, Lymphedema, Hypertension, Peripheral Venous Weeks Of Treatment: 1 History: Disease, Osteoarthritis Clustered Wound: No Photos Wound Measurements Length: (cm) 2.2 Width: (cm) 2.2 Depth: (cm) 0.2 Area:  (cm) 3.801 Volume: (cm) 0.76 % Reduction in Area: 59.7% % Reduction in Volume: 19.3% Epithelialization: Small (1-33%) Tunneling: No Undermining: No Wound Description Classification: Full Thickness Without Exposed Support Structures Wound Margin: Distinct, outline attached Exudate Amount: Medium Exudate Type: Serosanguineous Exudate Color: red, brown Foul Odor After Cleansing: No Slough/Fibrino No Wound Bed Granulation Amount: Small (1-33%) Exposed Structure Granulation Quality: Pink Fat Layer (Subcutaneous Tissue) Exposed: Yes Necrotic Amount: Large (67-100%) Necrotic Quality: Eschar, Adherent Slough Periwound Skin Texture Texture Color No Abnormalities Noted: Yes No Abnormalities Noted: Yes Moisture No Abnormalities Noted: No Dry / Scaly: No Maceration: No Treatment Notes Wound #9 (Lower Leg) Wound Laterality: Left, Anterior, Distal Cleanser Vashe 5.8 (oz) Discharge Instruction: Cleanse the wound with Vashe prior to applying a clean dressing using gauze sponges, not tissue or cotton balls. Peri-Wound Care Topical Mupirocin Ointment Discharge Instruction: Apply Mupirocin (Bactroban) as instructed Primary Dressing Maxorb Extra Ag+ Alginate Dressing, 4x4.75 (in/in) Discharge Instruction: Apply to wound bed as instructed Secondary Dressing Secured With Compression Wrap FourPress (4 layer compression wrap) Discharge Instruction: Apply four layer compression as directed. May also use Urgo K2 compression system as alternative. GENAVIEVE, COLAVITA ([id number]) [id number] Page 9 of 9 Compression Stockings Add-Ons Electronic Signature(s) Signed: 09/10/2022 5:29:10 PM By: Karie Schwalbe RN Entered By: Karie Schwalbe on 09/10/2022 10:09:51 -------------------------------------------------------------------------------- Vitals Details Patient Name: Date of Service: [name] 09/10/2022 9:30 A M Medical Record Number: [id number] Patient  Account Number: [id number] Date of Birth/Sex: Treating RN: October 13, 1955 (67 y.o. F) Primary Care Tyronne Blann: [name] Other Clinician: Referring Demario Faniel: Treating Macenzie Burford/Extender: Jasmine Pang in Treatment: 1 Vital Signs Time Taken: 09:44 Temperature (F): 98.3 Height (in): 62 Pulse (bpm): 90 Weight (lbs): 221 Respiratory Rate (breaths/min): 18 Body Mass Index (BMI): 40.4 Blood Pressure (mmHg): 145/79 Reference Range: 80 - 120 mg / dl Electronic Signature(s) Signed: 09/10/2022 1:18:11 PM By: Dayton Scrape Entered By: Dayton Scrape on 09/10/2022 09:45:30

## 2022-09-12 ENCOUNTER — Other Ambulatory Visit: Payer: Self-pay | Admitting: Nurse Practitioner

## 2022-09-12 ENCOUNTER — Other Ambulatory Visit (HOSPITAL_COMMUNITY): Payer: Self-pay

## 2022-09-12 MED ORDER — SIMVASTATIN 20 MG PO TABS
20.0000 mg | ORAL_TABLET | Freq: Every day | ORAL | 1 refills | Status: DC
  Filled 2022-09-12 – 2022-09-25 (×2): qty 90, 90d supply, fill #0
  Filled 2022-12-20: qty 90, 90d supply, fill #1

## 2022-09-17 ENCOUNTER — Encounter (HOSPITAL_BASED_OUTPATIENT_CLINIC_OR_DEPARTMENT_OTHER): Payer: Commercial Managed Care - PPO | Attending: General Surgery | Admitting: General Surgery

## 2022-09-17 DIAGNOSIS — L817 Pigmented purpuric dermatosis: Secondary | ICD-10-CM | POA: Diagnosis not present

## 2022-09-17 DIAGNOSIS — I87302 Chronic venous hypertension (idiopathic) without complications of left lower extremity: Secondary | ICD-10-CM | POA: Insufficient documentation

## 2022-09-17 DIAGNOSIS — I87312 Chronic venous hypertension (idiopathic) with ulcer of left lower extremity: Secondary | ICD-10-CM | POA: Insufficient documentation

## 2022-09-17 DIAGNOSIS — I872 Venous insufficiency (chronic) (peripheral): Secondary | ICD-10-CM | POA: Diagnosis not present

## 2022-09-17 DIAGNOSIS — E785 Hyperlipidemia, unspecified: Secondary | ICD-10-CM | POA: Insufficient documentation

## 2022-09-17 DIAGNOSIS — I890 Lymphedema, not elsewhere classified: Secondary | ICD-10-CM | POA: Insufficient documentation

## 2022-09-17 DIAGNOSIS — L98499 Non-pressure chronic ulcer of skin of other sites with unspecified severity: Secondary | ICD-10-CM | POA: Diagnosis not present

## 2022-09-17 DIAGNOSIS — E039 Hypothyroidism, unspecified: Secondary | ICD-10-CM | POA: Insufficient documentation

## 2022-09-17 DIAGNOSIS — L958 Other vasculitis limited to the skin: Secondary | ICD-10-CM | POA: Diagnosis not present

## 2022-09-17 DIAGNOSIS — I10 Essential (primary) hypertension: Secondary | ICD-10-CM | POA: Diagnosis not present

## 2022-09-17 DIAGNOSIS — L97928 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: Secondary | ICD-10-CM | POA: Insufficient documentation

## 2022-09-17 DIAGNOSIS — I739 Peripheral vascular disease, unspecified: Secondary | ICD-10-CM | POA: Insufficient documentation

## 2022-09-17 DIAGNOSIS — Z6841 Body Mass Index (BMI) 40.0 and over, adult: Secondary | ICD-10-CM | POA: Diagnosis not present

## 2022-09-17 DIAGNOSIS — M1990 Unspecified osteoarthritis, unspecified site: Secondary | ICD-10-CM | POA: Insufficient documentation

## 2022-09-17 DIAGNOSIS — L97822 Non-pressure chronic ulcer of other part of left lower leg with fat layer exposed: Secondary | ICD-10-CM | POA: Diagnosis not present

## 2022-09-17 NOTE — Progress Notes (Signed)
VERDELLA, STOEN ([id number]) [id number] Page 1 of 9 Visit Report for 09/17/2022 Arrival Information Details Patient Name: Date of Service: [name], [name] 09/17/2022 11:30 A M Medical Record Number: [id number] Patient Account Number: [id number] Date of Birth/Sex: Treating RN: 1956/01/10 (67 y.o. [name] Primary Care : [name] Other Clinician: Referring : Treating /Extender: Jasmine Pang in Treatment: 2 Visit Information History Since Last Visit Added or deleted any medications: No Patient Arrived: [name] Any new allergies or adverse reactions: No Arrival Time: 11:57 Had a fall or experienced change in No Accompanied By: self activities of daily living that may affect Transfer Assistance: None risk of falls: Patient Identification Verified: Yes Signs or symptoms of abuse/neglect since last visito No Secondary Verification Process Completed: Yes Hospitalized since last visit: No Patient Requires Transmission-Based Precautions: No Implantable device outside of the clinic excluding No Patient Has Alerts: No cellular tissue based products placed in the center since last visit: Has Dressing in Place as Prescribed: Yes Has Compression in Place as Prescribed: Yes Pain Present Now: No Electronic Signature(s) Signed: 09/17/2022 4:45:47 PM By: Samuella Bruin Entered By: Samuella Bruin on 09/17/2022 11:58:22 -------------------------------------------------------------------------------- Compression Therapy Details Patient Name: Date of Service: Erin Philips D. 09/17/2022 11:30 A M Medical Record Number: [id number] Patient Account Number: [id number] Date of Birth/Sex: Treating RN: 02-02-1956 (67 y.o. [name] Primary Care : [name] Other Clinician: Referring : Treating /Extender: Jasmine Pang in Treatment:  2 Compression Therapy Performed for Wound Assessment: Wound #9 Left,Distal,Anterior Lower Leg Performed By: Clinician Samuella Bruin, RN Compression Type: Double Layer Post Procedure Diagnosis Same as Pre-procedure Electronic Signature(s) Signed: 09/17/2022 4:45:47 PM By: Gelene Mink By: Samuella Bruin on 09/17/2022 12:08:38 Martie Round ([id number]) [id number] Page 2 of 9 -------------------------------------------------------------------------------- Encounter Discharge Information Details Patient Name: Date of Service: [name], [name] 09/17/2022 11:30 A M Medical Record Number: [id number] Patient Account Number: [id number] Date of Birth/Sex: Treating RN: Oct 30, 1955 (67 y.o. [name] Primary Care : [name] Other Clinician: Referring : Treating /Extender: Jasmine Pang in Treatment: 2 Encounter Discharge Information Items Post Procedure Vitals Discharge Condition: Stable Temperature (F): 98.3 Ambulatory Status: Cane Pulse (bpm): 94 Discharge Destination: Home Respiratory Rate (breaths/min): 18 Transportation: Private Auto Blood Pressure (mmHg): 124/71 Accompanied By: self Schedule Follow-up Appointment: Yes Clinical Summary of Care: Patient Declined Electronic Signature(s) Signed: 09/17/2022 4:45:47 PM By: Samuella Bruin Entered By: Samuella Bruin on 09/17/2022 13:02:30 -------------------------------------------------------------------------------- Lower Extremity Assessment Details Patient Name: Date of Service: [name], [name] 09/17/2022 11:30 A M Medical Record Number: [id number] Patient Account Number: [id number] Date of Birth/Sex: Treating RN: 1956/01/14 (67 y.o. [name] Primary Care : [name] Other Clinician: Referring : Treating /Extender: Jasmine Pang in Treatment:  2 Edema Assessment Assessed: [Left: No] [Right: No] [Left: Edema] [Right: :] Calf Left: Right: Point of Measurement: 34 cm From Medial Instep 42 cm 45.5 cm Ankle Left: Right: Point of Measurement: 9 cm From Medial Instep 21 cm 23 cm Vascular Assessment Pulses: Dorsalis Pedis Palpable: [Left:Yes] Extremity colors, hair growth, and conditions: Hair Growth on Extremity: [Left:No] [Right:No] Temperature of Extremity: [Left:Warm] [Right:Warm] Capillary Refill: [Left:< 3 seconds] [Right:< 3 seconds] Dependent Rubor: [Left:No No] [Right:No No] Electronic Signature(s) Signed: 09/17/2022 4:45:47 PM By: Samuella Bruin Entered By: Samuella Bruin on 09/17/2022 12:03:04 Tiburcio Bash D ([id number]) [id number] Page 3 of 9 -------------------------------------------------------------------------------- Multi Wound Chart Details Patient Name: Date of Service: [name]  [name]. 09/17/2022 11:30 A M Medical Record Number: [id number] Patient Account Number: [id number] Date of Birth/Sex: Treating RN: 1955/04/25 (67 y.o. F) Primary Care : [name] Other Clinician: Referring : Treating /Extender: Jasmine Pang in Treatment: 2 Vital Signs Height(in): 62 Pulse(bpm): 94 Weight(lbs): 221 Blood Pressure(mmHg): 124/71 Body Mass Index(BMI): 40.4 Temperature(F): 98.3 Respiratory Rate(breaths/min): 18 [8:Photos:] [N/A:N/A] Left, Proximal, Anterior Lower Leg Left, Distal, Anterior Lower Leg N/A Wound Location: Gradually Appeared Gradually Appeared N/A Wounding Event: Venous Leg Ulcer Vasculitis N/A Primary Etiology: Cataracts, Lymphedema, Cataracts, Lymphedema, N/A Comorbid History: Hypertension, Peripheral Venous Hypertension, Peripheral Venous Disease, Osteoarthritis Disease, Osteoarthritis 07/14/2022 07/14/2022 N/A Date Acquired: 2 2 N/A Weeks of Treatment: Open Open N/A Wound Status: No No N/A Wound  Recurrence: 0.4x0.3x0.1 2.4x2.2x0.2 N/A Measurements L x W x D (cm) 0.094 4.147 N/A A (cm) : rea 0.009 0.829 N/A Volume (cm) : 98.10% 56.00% N/A % Reduction in A rea: 99.40% 12.00% N/A % Reduction in Volume: Full Thickness Without Exposed Full Thickness Without Exposed N/A Classification: Support Structures Support Structures None Present Medium N/A Exudate A mount: N/A Serosanguineous N/A Exudate Type: N/A red, brown N/A Exudate Color: Distinct, outline attached Distinct, outline attached N/A Wound Margin: None Present (0%) Medium (34-66%) N/A Granulation A mount: N/A Red, Pink N/A Granulation Quality: None Present (0%) Medium (34-66%) N/A Necrotic A mount: N/A Eschar, Adherent Slough N/A Necrotic Tissue: Fascia: No Fat Layer (Subcutaneous Tissue): Yes N/A Exposed Structures: Fat Layer (Subcutaneous Tissue): No Fascia: No Tendon: No Tendon: No Muscle: No Muscle: No Joint: No Joint: No Bone: No Bone: No Large (67-100%) Small (1-33%) N/A Epithelialization: Debridement - Selective/Open Wound Debridement - Selective/Open Wound N/A Debridement: Pre-procedure Verification/Time Out 12:08 12:08 N/A Taken: Lidocaine 4% Topical Solution Lidocaine 4% Topical Solution N/A Pain Control: Necrotic/Eschar, Ambulance person, Slough N/A Tissue Debrided: Non-Viable Tissue Non-Viable Tissue N/A Level: 0.09 4.14 N/A Debridement A (sq cm): rea Curette Curette N/A Instrument: Minimum Minimum N/A Bleeding: Pressure Pressure N/A Hemostasis A chieved: Procedure was tolerated well Procedure was tolerated well N/A Debridement Treatment Response: 0.4x0.3x0.1 2.4x2.2x0.2 N/A Post Debridement Measurements L x W x D (cm) 0.009 0.829 N/A Post Debridement Volume: (cm) No Abnormalities Noted No Abnormalities Noted N/A [address]KEAJAH, MIESNER ([id number]) [id number] Page 4 of 9 No Abnormalities Noted Maceration: No  N/A Periwound Skin Moisture: Dry/Scaly: No No Abnormalities Noted Hemosiderin Staining: Yes N/A Periwound Skin Color: No Abnormality No Abnormality N/A Temperature: Debridement Compression Therapy N/A Procedures Performed: Debridement Treatment Notes Electronic Signature(s) Signed: 09/17/2022 12:21:29 PM By: [name] MD FACS Entered By: [name] on 09/17/2022 12:21:29 -------------------------------------------------------------------------------- Multi-Disciplinary Care Plan Details Patient Name: Date of Service: Erin Philips D. 09/17/2022 11:30 A M Medical Record Number: [id number] Patient Account Number: [id number] Date of Birth/Sex: Treating RN: November 11, 1955 (67 y.o. [name] Primary Care : [name] Other Clinician: Referring : Treating /Extender: Jasmine Pang in Treatment: 2 Active Inactive Wound/Skin Impairment Nursing Diagnoses: Impaired tissue integrity Goals: Patient/caregiver will verbalize understanding of skin care regimen Date Initiated: 09/03/2022 Target Resolution Date: 11/12/2022 Goal Status: Active Interventions: Assess patient/caregiver ability to obtain necessary supplies Assess patient/caregiver ability to perform ulcer/skin care regimen upon admission and as needed Assess ulceration(s) every visit Provide education on ulcer and skin care Screen for HBO Treatment Activities: Skin care regimen initiated : 08/31/2022 Topical wound management initiated : 08/31/2022 Notes: Electronic Signature(s) Signed: 09/17/2022 4:45:47 PM By: Samuella Bruin Entered By: Samuella Bruin on 09/17/2022 12:11:06 -------------------------------------------------------------------------------- Pain Assessment  Details Patient Name: Date of Service: LEONA, ROSENE 09/17/2022 11:30 A M Medical Record Number: [id number] Patient Account Number: [id number] Date of Birth/Sex: Treating  RN: [birth year] (67 y.o. [name] Primary Care : [name] Other Clinician: ADDILYNE, CASAGRANDE ([id number]) [id number] Page 5 of 9 Referring : Treating /Extender: Jasmine Pang in Treatment: 2 Active Problems Location of Pain Severity and Description of Pain Patient Has Paino No Site Locations Rate the pain. Current Pain Level: 0 Pain Management and Medication Current Pain Management: Electronic Signature(s) Signed: 09/17/2022 4:45:47 PM By: Samuella Bruin Entered By: Samuella Bruin on 09/17/2022 11:58:43 -------------------------------------------------------------------------------- Patient/Caregiver Education Details Patient Name: Date of Service: [name] 8/5/2024andnbsp11:30 A M Medical Record Number: [id number] Patient Account Number: [id number] Date of Birth/Gender: Treating RN: 1955/07/30 (67 y.o. [name] Primary Care Physician: [name] Other Clinician: Referring Physician: Treating Physician/Extender: Jasmine Pang in Treatment: 2 Education Assessment Education Provided To: Patient Education Topics Provided Wound/Skin Impairment: Methods: Explain/Verbal Responses: Reinforcements needed, State content correctly Electronic Signature(s) Signed: 09/17/2022 4:45:47 PM By: Samuella Bruin Entered By: Samuella Bruin on 09/17/2022 12:11:20 Tiburcio Bash D ([id number]) [id number] Page 6 of 9 -------------------------------------------------------------------------------- Wound Assessment Details Patient Name: Date of Service: [name], [name] 09/17/2022 11:30 A M Medical Record Number: [id number] Patient Account Number: [id number] Date of Birth/Sex: Treating RN: 20-Jul-1955 (67 y.o. [name] Primary Care : [name] Other Clinician: Referring : Treating  /Extender: Jasmine Pang in Treatment: 2 Wound Status Wound Number: 8 Primary Venous Leg Ulcer Etiology: Wound Location: Left, Proximal, Anterior Lower Leg Wound Open Wounding Event: Gradually Appeared Status: Date Acquired: 07/14/2022 Comorbid Cataracts, Lymphedema, Hypertension, Peripheral Venous Weeks Of Treatment: 2 History: Disease, Osteoarthritis Clustered Wound: No Photos Wound Measurements Length: (cm) 0.4 Width: (cm) 0.3 Depth: (cm) 0.1 Area: (cm) 0.094 Volume: (cm) 0.009 % Reduction in Area: 98.1% % Reduction in Volume: 99.4% Epithelialization: Large (67-100%) Tunneling: No Undermining: No Wound Description Classification: Full Thickness Without Exposed Support Structures Wound Margin: Distinct, outline attached Exudate Amount: None Present Foul Odor After Cleansing: No Slough/Fibrino No Wound Bed Granulation Amount: None Present (0%) Exposed Structure Necrotic Amount: None Present (0%) Fascia Exposed: No Fat Layer (Subcutaneous Tissue) Exposed: No Tendon Exposed: No Muscle Exposed: No Joint Exposed: No Bone Exposed: No Periwound Skin Texture Texture Color No Abnormalities Noted: Yes No Abnormalities Noted: Yes Moisture Temperature / Pain No Abnormalities Noted: Yes Temperature: No Abnormality Treatment Notes Wound #8 (Lower Leg) Wound Laterality: Left, Anterior, Proximal Cleanser Vashe 5.8 (oz) Discharge Instruction: Cleanse the wound with Vashe prior to applying a clean dressing using gauze sponges, not tissue or cotton balls. Peri-Wound Care DEANA, WIEGEL D ([id number]) [id number] Page 7 of 9 Topical Mupirocin Ointment Discharge Instruction: Apply Mupirocin (Bactroban) as instructed Primary Dressing Maxorb Extra Ag+ Alginate Dressing, 4x4.75 (in/in) Discharge Instruction: Apply to wound bed as instructed Secondary Dressing ABD Pad, 8x10 Discharge Instruction: Apply over primary  dressing as directed. Secured With Compression Wrap FourPress (4 layer compression wrap) Discharge Instruction: Apply four layer compression as directed. May also use Urgo K2 compression system as alternative. Compression Stockings Add-Ons Electronic Signature(s) Signed: 09/17/2022 4:45:47 PM By: Samuella Bruin Entered By: Samuella Bruin on 09/17/2022 12:06:14 -------------------------------------------------------------------------------- Wound Assessment Details Patient Name: Date of Service: [name], [name]. 09/17/2022 11:30 A M Medical Record Number: [id number] Patient Account Number: [id number] Date of Birth/Sex: Treating RN: 1955-06-10 (67 y.o. [name] Primary Care : [name] Other Clinician:  Referring : Treating /Extender: Jasmine Pang in Treatment: 2 Wound Status Wound Number: 9 Primary Vasculitis Etiology: Wound Location: Left, Distal, Anterior Lower Leg Wound Open Wounding Event: Gradually Appeared Status: Date Acquired: 07/14/2022 Comorbid Cataracts, Lymphedema, Hypertension, Peripheral Venous Weeks Of Treatment: 2 History: Disease, Osteoarthritis Clustered Wound: No Photos Wound Measurements Length: (cm) 2.4 Width: (cm) 2.2 Depth: (cm) 0.2 Area: (cm) 4.147 Volume: (cm) 0.829 % Reduction in Area: 56% % Reduction in Volume: 12% Epithelialization: Small (1-33%) Tunneling: No Undermining: No Wound Description Classification: Full Thickness Without Exposed Support Structures [name], [name] ([id number]) Wound Margin: Distinct, outline attached Exudate Amount: Medium Exudate Type: Serosanguineous Exudate Color: red, brown Foul Odor After Cleansing: No [id number] Page 8 of 9 Slough/Fibrino Yes Wound Bed Granulation Amount: Medium (34-66%) Exposed Structure Granulation Quality: Red, Pink Fascia Exposed: No Necrotic Amount: Medium (34-66%) Fat Layer  (Subcutaneous Tissue) Exposed: Yes Necrotic Quality: Eschar, Adherent Slough Tendon Exposed: No Muscle Exposed: No Joint Exposed: No Bone Exposed: No Periwound Skin Texture Texture Color No Abnormalities Noted: Yes No Abnormalities Noted: No Hemosiderin Staining: Yes Moisture No Abnormalities Noted: No Temperature / Pain Dry / Scaly: No Temperature: No Abnormality Maceration: No Treatment Notes Wound #9 (Lower Leg) Wound Laterality: Left, Anterior, Distal Cleanser Vashe 5.8 (oz) Discharge Instruction: Cleanse the wound with Vashe prior to applying a clean dressing using gauze sponges, not tissue or cotton balls. Peri-Wound Care Topical Mupirocin Ointment Discharge Instruction: Apply Mupirocin (Bactroban) as instructed Primary Dressing Maxorb Extra Ag+ Alginate Dressing, 4x4.75 (in/in) Discharge Instruction: Apply to wound bed as instructed Secondary Dressing ABD Pad, 8x10 Discharge Instruction: Apply over primary dressing as directed. Secured With Compression Wrap FourPress (4 layer compression wrap) Discharge Instruction: Apply four layer compression as directed. May also use Urgo K2 compression system as alternative. Compression Stockings Add-Ons Electronic Signature(s) Signed: 09/17/2022 4:45:47 PM By: Samuella Bruin Entered By: Samuella Bruin on 09/17/2022 12:06:37 -------------------------------------------------------------------------------- Vitals Details Patient Name: Date of Service: [name] [name]. 09/17/2022 11:30 A M Medical Record Number: [id number] Patient Account Number: [id number] Date of Birth/Sex: Treating RN: 1955-06-11 (67 y.o. [name] Primary Care : [name] Other Clinician: Referring : Treating /Extender: Jasmine Pang in Treatment: 2 ANJELITA, GROENEWOLD D ([id number]) [id number] Page 9 of 9 Vital Signs Time Taken: 11:58 Temperature (F):  98.3 Height (in): 62 Pulse (bpm): 94 Weight (lbs): 221 Respiratory Rate (breaths/min): 18 Body Mass Index (BMI): 40.4 Blood Pressure (mmHg): 124/71 Reference Range: 80 - 120 mg / dl Electronic Signature(s) Signed: 09/17/2022 4:45:47 PM By: Samuella Bruin Entered By: Samuella Bruin on 09/17/2022 11:58:36

## 2022-09-17 NOTE — Progress Notes (Signed)
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 08/31/2022: Returns with new wound, same site. Started as blister. Patient thinks compression stockings causing blisters. 09/10/2022: The small distal cluster of wounds has closed. She has a new wound proximal to the main wound. It is superficial and limited to breakdown of skin and appears secondary to friction. The main wound has a fibrotic surface with slough accumulation. Edema control is acceptable. 09/17/2022: The proximal wound has eschar on the surface. Underneath, there is a little bit of slough. The fibrotic surface of the main wound has not really improved, although there is some granulation tissue in the center. Edema control is good. Electronic Signature(s) Signed: 09/17/2022 12:22:51 PM By: [name] MD FACS Entered By: [name] on 09/17/2022 12:22:51 TSUYAKO, SCHNACKENBERG [name] ([id number]) [id number] Page 6 of 15 -------------------------------------------------------------------------------- Physical Exam Details Patient Name: Date of Service: TSURUE, WARGEL 09/17/2022 11:30 A M Medical Record Number: [id number] Patient Account  Number: [id number] Date of Birth/Sex: Treating RN: [birth year] (67 y.o. F) Primary Care Provider: [name] Other Clinician: Referring Provider: Treating Provider/Extender: Jasmine Pang in Treatment: 2 Constitutional . . . . no acute distress. Respiratory Normal work of breathing on room air. Notes 09/17/2022: The proximal wound has eschar on the surface. Underneath, there is a little bit of slough. The fibrotic surface of the main wound has not really improved, although there is some granulation tissue in the center. Edema control is good. Electronic Signature(s) Signed: 09/17/2022 12:23:24 PM By: [name] MD FACS Entered By: [name] on 09/17/2022 12:23:24 -------------------------------------------------------------------------------- Physician Orders Details Patient Name: Date of Service: Erin Philips [name] 09/17/2022 11:30 A M Medical Record Number: [id number] Patient Account Number: [id number] Date of Birth/Sex: Treating RN: 1955/12/17 (67 y.o. 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 - Dr. Lady Gary Return A Mon 10/01/22 9:15am Room 3 ; 10/08/22 at 9:30am Room 3 Nurse Visit: - Monday 09/24/22 at 9:15am Room 6 Discharge From [facility] Discharge from [facility] - Please keep wearing compression stockings. Anesthetic (In clinic) Topical Lidocaine 4% applied to  wound bed Bathing/ Shower/ Hygiene May shower with protection but do not get wound dressing(s) wet. Protect dressing(s) with water  repellant cover (for example, large plastic bag) or a cast cover and may then take shower. Wound Treatment Wound #8 - Lower Leg Wound Laterality: Left, Anterior, Proximal Cleanser: Vashe 5.8 (oz) 1 x Per Day/30 Days Discharge Instructions: Cleanse the wound with Vashe prior to applying a clean dressing using gauze sponges, not tissue or cotton balls. TIHESHA, [name] ([id number]) [id number] Page 7 of 15 Topical: Mupirocin Ointment 1 x Per Day/30 Days Discharge Instructions: Apply Mupirocin (Bactroban) as instructed Prim Dressing: Maxorb Extra Ag+ Alginate Dressing, 4x4.75 (in/in) 1 x Per Day/30 Days ary Discharge Instructions: Apply to wound bed as instructed Secondary Dressing: ABD Pad, 8x10 1 x Per Day/30 Days Discharge Instructions: Apply over primary dressing as directed. Compression Wrap: FourPress (4 layer compression wrap) 1 x Per Day/30 Days Discharge Instructions: Apply four layer compression as directed. May also use Urgo K2 compression system as alternative. Wound #9 - Lower Leg Wound Laterality: Left, Anterior, Distal Cleanser: Vashe 5.8 (oz) 1 x Per Day/30 Days Discharge Instructions: Cleanse the wound with Vashe prior to applying a clean dressing using gauze sponges, not tissue or cotton balls. Topical: Mupirocin Ointment 1 x Per Day/30 Days Discharge Instructions: Apply Mupirocin (Bactroban) as instructed Prim Dressing: Maxorb Extra Ag+ Alginate Dressing, 4x4.75 (in/in) 1 x Per Day/30 Days ary Discharge Instructions: Apply to wound bed as instructed Secondary Dressing: ABD Pad, 8x10 1 x Per Day/30 Days Discharge Instructions: Apply over primary dressing as directed. Compression Wrap: FourPress (4 layer compression wrap) 1 x Per Day/30 Days Discharge Instructions: Apply four layer compression as directed. May also use Urgo K2 compression system as alternative. Patient Medications llergies: No Known Drug Allergies A Notifications Medication  Indication Start End 09/17/2022 lidocaine DOSE topical 4 % cream - cream topical Electronic Signature(s) Signed: 09/17/2022 12:37:08 PM By: [name] MD FACS Entered By: [name] on 09/17/2022 12:23:43 -------------------------------------------------------------------------------- Problem List Details Patient Name: Date of Service: Erin Philips [name] 09/17/2022 11:30 A M Medical Record Number: [id number] Patient Account Number: [id number] Date of Birth/Sex: Treating RN: 1955-05-11 (67 y.o. 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 09/17/2022 11:30 A M Medical Record Number: [id number] Patient Account Number: [id number] Date of Birth/Sex: Treating RN: 1955/03/08 (67 y.o. F) Primary Care Provider: [name] Other Clinician: Referring Provider: Treating Provider/Extender: Jasmine Pang in Treatment: 2 Subjective Chief Complaint Information obtained from Patient 04/19/2021: The patient is here for ongoing follow-up regarding 2 left lower extremity wounds. 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 She tells [name] that she fell on the gravel driveway in July. She developed injuries on her distal lower leg which have  not healed. She saw her primary physician on 11/15/2017 who noted her left shin injuries. Gave her antibiotics. At that point the wounds were almost circumferential however most were less than 1.5 cm. Weeping edema fluid was noted. She was referred here for evaluation. The patient has a history of chronic lower extremity edema. 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 She has had prior history with leg weeping fluid. She does not wear compression stockings. She is not doing anything specific to these wound areas. 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 She also has lymphedema. Put her in 3 layer compression and silver alginate last week. She has 3 small wounds with depth just lateral to the tibia. More problematically than this she has numerous shallow areas some of which are almost canal like in shape with tightly adherent painful debris. It would be very difficult and time- consuming to go through this and attempt to individually debride all these areas.. I changed her to collagen today to see if that would help with any of the surface debris on some of these wounds. Otherwise we will not be able to put this in compression we have to have someone change the dressing. The patient is not eligible for home health 12/25/17 on evaluation today  patient actually appears to be doing rather well in regard to the ulcer on her lower extremity. Fortunately there does not appear to be evidence of infection at this time. She has been tolerating the dressing changes without complication. This includes the compression wrap. The only issue she had was that the wrap was initially placed over her bunion region which actually calls her some discomfort and pain. Other than that things seem to be going rather well. 01/01/2018 Seen today for follow-up and management of left lower extremity wound and lymphedema. T oday she presents with a new wound towards to the left lateral LE. Recently treated with a 7 day course of amoxicillin; reason for antibiotic dose is unknown at this time. Tolerating current treatment of collagen with 4- layer wraps. She obtained a venous reflux study on 12/25/17. Studies show on the right abnormal reflux times of the popliteal vein, great saphenous vein at the saphenofemoral junction at the proximal thigh, great saphenous vein at the mid calf, and origin of the small saphenous vein.No superficial thrombosis. No deep vein thrombosis in the common femoral, femoral,and popliteal veins. Left abnormal reflex times as well of the common femoral vein, popliteal vein, and a great saphenous vein at the saphenofemoral junction, In great saphenous vein at the mid thigh w/o thrombosis. Has any issues or concerns during visit today. Recommended follow-up to vascular specialist due to abnormalities from the venous reflux study. Denies fever, pain, chills, dizziness, nausea, or vomiting. 01/08/18 upon evaluation today the patient actually seems to be showing some signs of improvement in my opinion at this point in regard to the lower extremity ulcerated areas. She still has a lot of drainage but fortunately nothing that appears to be too significant currently. I have been very happy with the overall progress I see today compared to where things  were during the last evaluation that I  had with her. Nonetheless she has not had her appointment with the vein specialist as of yet in fact we were able to get this approved for her today and confirmed with them she will be seeing them on December 26. Nonetheless in general I do feel like the compression wraps is doing well for her. 01/17/18; quite a bit of improvement since last time I saw this patient she has a small open area remaining on the left lateral calf and even smaller area medially. She has lymphedema chronic stasis changes with distal skin fibrosis. She has an appointment with vascular surgery later this month 01/24/2018; the patient's medial leg has closed. Still a small open area on the left lateral leg. She states that the 4 layer compression we put on last week was too tight and she had to take it off over a few days ago. We have had resultant increase in her lymphedema in the dorsal foot and a proximal calf. Fortunately that does not seem to have resulted in any deterioration in her wounds The patient is going to need compression stockings. We have given her measurements to phone elastic therapy in [place]. She has vascular surgery consult on December 26 02/07/18; she's had continuous contraction on the left lateral leg wound which is now very small. Currently using silver alginate under 3 layer compression She saw Dr. Myra Gianotti of vascular surgery on 02/06/18. It was noted that she had a normal reflux times in the popliteal vein, great saphenous vein at the [name], [name] ([id number]) [id number] Page 9 of 15 saphenofemoral junction, great saphenous vein at the proximal thigh great saphenous vein at the mid calf and origin of the small saphenous vein. It was noted that she had significant reflux in the left saphenous veins with diameter measurements in the 0.7-0.8 cm range. It was felt she would benefit from laser ablation to help minimize the risk of  ulcer recurrence. It was recommended that she wear 20-30 thigh-high compression stockings and have follow-up in 4-6 weeks 02/17/2018; I thought this lady would be healed however her compression slipped down and she developed increasing swelling and the wound is actually larger. 1/13; we had deterioration last week after the patient's compression slipped down and she developed periwound swelling. I increased her compression before layers we have been using silver alginate we are a lot better again today. She has her compression stockings in waiting 1/23; the patient's wounds are totally healed today. She has her stockings. This was almost circumferential skin damage. She follows up with Dr. Myra Gianotti of vascular surgery next Monday. READMISSION 02/21/2021 This is a now 67 year old woman that we had in clinic here discharging in January 2020 with wounds on her left calf chronic venous insufficiency. She was discharged with 30/40 stockings. It does not sound like she has worn stockings in about a year largely from not being able to get them on herself. In November she developed new blisters on her legs an area laterally is opened into a fairly sizable wound. She has weeping posteriorly as well. She has been using Neosporin and Band-Aids. The patient did see Dr. Myra Gianotti in 2019 and 2020. He felt she might benefit from laser ablation in her left saphenous veins although because of the wound that healed I do not think he went through with it. She might benefit from seeing him again. Her ABI on the left is 1. 1/17; patient's wound on the posterior left calf is closed she has the 2 large areas last  week. We put her in 4-layer compression for edema control is a lot better. Our intake nurse noted greenish drainage and odor. We have been using silver alginate 1/25; PCR culture I did have the substantial wound area on the left lateral lower leg showed staff aureus and group A strep. Low titers of coag negative  staph which are probably skin contaminants. Resistance detected to tetracycline methicillin and macrolides. I gave her a starter kit of Nuzyra 150 mg x 3 for 2 days then 300 mg for a further 7 days. Marked odor considerable increase in surrounding erythema. We are using Iodoflex last week however I have changed to silver alginate with underlying Bactroban 2/1; she is completing her [place] tomorrow. The degree of erythema around the wounds looks a lot better. Odor has improved. I gave her silver alginate and Bactroban last week and changing her back to Iodoflex to continue with ongoing debridement 2/8; the periwound looks a lot better. Surface of the wound also looks somewhat better although there is still ongoing debridement to be done we have been using Iodoflex under compression. Primary dressing and silver alginate 2/15; left posterior calf. Some improvement in the surface of the wound but still very gritty we have been using Iodoflex under compression. 04/05/2021: Left lateral posterior calf. She continues to have significant amounts of drainage, some of which is probably comprised of the Iodoflex microbleeds. There is no odor to the drainage. The satellite lesion on the more posterior aspect of the calf is epithelializing nicely and has contracted quite a bit. There is robust granulation tissue in the dominant wound with minimal adherent slough. 04/12/2021: The satellite lesion has nearly closed. She continues to have good granulation tissue with minimal slough of the larger primary wound. She continues to have a fair amount of drainage, but I think this is less secondary to switching her dressing from Iodoflex to Prisma. 04/19/2021: The satellite lesion is almost completely epithelialized. The larger primary wound continues to contract with good granulation tissue and minimal slough. She is currently in [place] with compression. 04/26/2021: The satellite lesion has closed. The larger primary wound has  contracted further and the granulation tissue is robust without being hypertrophic. Minimal slough is present. 05/03/2021: The satellite lesion remains closed. The larger primary wound has a bit of slough, but has also contracted further with good perimeter epithelialization. Good granulation tissue at the wound surface. There was some greenish drainage on the dressing when it was removed; it is not the blue-green typically associated with Pseudomonas aeruginosa, however. 05/10/2021: The primary wound continues to contract. There is minimal slough. The granulation tissue at 9:00 is a bit hypertrophic. No significant drainage. No odor. 05/17/2021: The wound is a little bit smaller today with good granulation tissue. Minimal slough. No significant drainage or odor. 05/24/2021: The wound continues to contract and has a nice base of granulation tissue. Small amount of slough. No concern for infection. 05/31/2021: For some reason, the wound measured slightly larger today but overall it still appears to be in good condition with a nice base of granulation tissue and minimal slough. 06/07/2021: The wound is smaller today. Good granulation tissue and minimal slough. 06/14/2021: The wound is unchanged in size. She continues to accumulate some slough. Good granulation tissue on the surface. 06/20/2021: The wound is smaller today. Minimal slough with good granulation tissue. 06/27/2021: The wound on her left lateral leg is smaller today with just a bit of slough and eschar accumulation. Unfortunately, she has opened a  new superficial wound on her right lower extremity. She has 3+ pitting edema to the knees on that leg and she does not wear compression stockings. 07/04/2021: In addition to the superficial wound on her right lower extremity that she opened up last week, she has opened 3 additional sites on the same leg. Her compression wraps were clearly not in correct position when she came to clinic today as they were at  about the mid calf level rather than to the tibial tuberosity. She says that they slipped earlier and she had to come back on Friday to have them redone. The wound on her left lateral leg is perhaps slightly larger with some slough accumulation. 07/11/2021: The superficial wounds on her right lower extremity are nearly closed. They just have a thin layer of eschar overlying them. The wound on her left lateral leg is a little bit shallower and has a bit of slough accumulation. 07/17/2021: The right medial lower extremity leg wounds have almost completely closed; one of them just has a tiny opening with a little bit of serous drainage. The left lateral leg wound is really unchanged. It seems to be stalled. 07/24/2021: The right leg wounds are completely closed. The left lateral leg wound is actually bigger today. It is a bit more tender. There is a minor accumulation of slough on the surface. 07/31/2021: The culture that I took last week was positive for MRSA. We added mupirocin under the silver alginate and I prescribed doxycycline. She has been tolerating this well. The wound is smaller today but does have slough accumulation. No significant pain or drainage. 08/07/2021: She completed her course of doxycycline. The wound looks much better today. It is smaller and the periwound is less inflamed. She does have SAKIA, BRIZUELA [name] ([id number]) [id number] Page 10 of 15 some slough accumulation on the surface. 08/14/2021: The wound continues to contract. There is slough on the wound surface, but the periwound is intact without inflammation or induration. 08/21/2021: The wound is down to just 2 small open sites. They both have a bit of slough accumulation. 08/28/2021: The wound is down to just 1 small open site with a little bit of slough and eschar accumulation. 09/04/2021: The wound continues to contract but remains open. It is clean without any slough. 09/12/2021: No real change in the  overall wound dimensions, but it is flush with the surrounding skin. There is a little bit of slough accumulation. Edema control is good. 09/22/2021: The wound is smaller and even more superficial. Minimal slough accumulation. Good control of edema. 09/29/2021: The wound continues to contract. She reports that it was a little bit "twingey" during the week. It is a little bit dry on inspection today. Light accumulation of slough. Good edema control. 10/06/2021: The wound is about the same size today. There is a little slough on the surface. Edema control is good. 10/13/2021: The wound is about the same size but more superficial. A little bit of slough on the surface. 10/23/2021: The wound is slightly smaller and continues to fill in. Minimal slough on the wound surface. 10/30/2021: The wound continues to contract but has not yet closed. Light slough and eschar present. 11/06/2021: The wound persists, but seems a little bit more epithelialized. There is still slough and eschar accumulation. 11/14/2021: The wound continues to contract but persists. Light eschar around the wound. 11/22/2021: The wound is down to just a pinhole opening. There is eschar overlying the surface. Edema control is excellent. 11/29/2021: Her wound is  closed. READMISSION 01/22/2022 This is a patient to be discharged in October. She has severe chronic venous insufficiency at that time she had a wound on her left lower leg posteriorly also the right leg at some point. These closed. We discharged her in 30/40 mm stockings from elastic therapy which she has been wearing religiously. She tells [name] that she was traumatized by a dolly while shopping at [employer] about 2 to 3 weeks ago. She has been left with a left anterior lower leg wound. She comes in in another pair of stockings other than her 3040s. She does not have an arterial issue with her last ABI in the left at 1.2 12/18; this is a patient who has chronic venous insufficiency and  recurrent venous insufficiency ulcers. She has a area on her left anterior lower leg and we readmitted her to the clinic last week. We use silver alginate and 4-layer compression. ABI was 1.2 She brought in her stocking which was a 20/30 below-knee stocking from elastic therapy. She may require a 30/40 mm equivalent stockings after this wound heals. 02/06/2022: The intake nurse reported an odor coming from the wound when she first unwrapped it but this abated after the leg was washed. There is thick layer of slough on the surface. 02/13/2022: The wound is cleaner today. It measured larger, but on visual inspection, I think some crust of Iodoflex was included in the wound measurements; the actual wound itself looks about the same to slightly smaller. Edema control is good. 02/20/2022: The wound is cleaner again today. It is also measuring a little bit smaller, but there has been some moisture related periwound breakdown. Edema control is good. 02/27/2022: The intake nurse reported that the wound measures larger, but some of this ended up being crusted on Iodoflex. There is still some periwound moisture. Still with thick slough accumulation. Edema control is good. 03/06/2022: The wound is cleaner and more superficial, but did measure larger because of the inclusion of a satellite area. Still with some slough accumulation but less so than on prior visits. 03/13/2022: The wound measured a little bit smaller today. There is slough and eschar accumulation, as per usual. 03/20/2022: Her wound is larger and deeper today. The entire surface appears nonviable. There is more periwound erythema and threatened tissue breakdown. 03/27/2022: The culture that I took last week was positive for MRSA. We had empirically applied topical mupirocin to her wound. Today the wound is smaller and cleaner and less painful. There is still a layer of slough on the surface. 04/03/2022: The wound was measured slightly larger today, but  appears roughly the same to my eye. There is a layer of slough on the surface. Underneath, the tissue is quite fibrotic. 04/10/2022: The wound is slightly smaller today. There is slough and eschar accumulation. For some reason, her wrap got changed from 4 layers to 3 layers and her edema control is not as good, as a result. 04/17/2022: The return to true 4-layer compression has improved the wound considerably. It is much smaller and cleaner today. Edema control is excellent. 04/24/2022: Her wound continues to improve. It is smaller with just a bit of slough on the surface. Edema control is excellent. 05/08/2022: Her wound is smaller again today. There is still slough accumulation on the surface. Edema control remains excellent. 05/15/2022: The wound is substantially smaller this week. There is slough on the surface with excellent edema control. 05/22/2022: Her wound is smaller again this week. Minimal slough on the surface. Good  edema control. 05/29/2022: Once again, her wound is smaller. There is a bit of slough accumulation. Edema control remains excellent. 06/05/2022: Her wound is smaller again by about half. There is some slough and eschar buildup. Edema control is consistently excellent. 06/12/2022: The wound was initially measured larger by couple of millimeters, but upon debridement, much of this was found to be slough and eschar. The wound is actually about the same size. [name], [name] ([id number]) [id number] Page 11 of 15 06/18/2022: The wound is down to just a couple of millimeters, covered by eschar. 06/26/2022: Her wound is healed. 07/02/2022: Patient was seen today as part of the secondary prevention program. This is a 2-week follow-up visit. Her wound remains closed. She is wearing her compression stockings as advised. 08/31/2022: Returns with new wound, same site. Started as blister. Patient thinks compression stockings causing blisters. 09/10/2022: The small distal  cluster of wounds has closed. She has a new wound proximal to the main wound. It is superficial and limited to breakdown of skin and appears secondary to friction. The main wound has a fibrotic surface with slough accumulation. Edema control is acceptable. 09/17/2022: The proximal wound has eschar on the surface. Underneath, there is a little bit of slough. The fibrotic surface of the main wound has not really improved, although there is some granulation tissue in the center. Edema control is good. Patient History Information obtained from Patient. Family History Heart Disease - Mother,Father, Hypertension - Mother,Father, Kidney Disease - Mother, Thyroid Problems - Mother, No family history of Cancer, Diabetes, Hereditary Spherocytosis, Lung Disease, Seizures, Stroke, Tuberculosis. Social History Never smoker, Marital Status - Single, Alcohol Use - Never, Drug Use - No History, Caffeine Use - Daily - coffee. Medical History Eyes Patient has history of Cataracts Hematologic/Lymphatic Patient has history of Lymphedema Cardiovascular Patient has history of Hypertension, Peripheral Venous Disease Integumentary (Skin) Denies history of History of Burn Musculoskeletal Patient has history of Osteoarthritis Hospitalization/Surgery History - colostomy reversal. 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 Medical A Surgical History Notes nd Constitutional Symptoms (General Health) morbid obesity Cardiovascular schamberg disease, hyperlipidemia Gastrointestinal diverticulitis , h/o obstruction due to diverticulitis , colostomy and colostomy reversal Endocrine hypothyroidism Objective Constitutional no acute distress. Vitals Time Taken: 11:58 AM, Height: 62 in, Weight: 221 lbs, BMI: 40.4, Temperature: 98.3 F, Pulse: 94 bpm, Respiratory Rate: 18 breaths/min, Blood Pressure: 124/71 mmHg. Respiratory Normal work of breathing on room air. General Notes: 09/17/2022: The proximal wound has eschar on the  surface. Underneath, there is a little bit of slough. The fibrotic surface of the main wound has not really improved, although there is some granulation tissue in the center. Edema control is good. Integumentary (Hair, Skin) Wound #8 status is Open. Original cause of wound was Gradually Appeared. The date acquired was: 07/14/2022. The wound has been in treatment 2 weeks. The wound is located on the Left,Proximal,Anterior Lower Leg. The wound measures 0.4cm length x 0.3cm width x 0.1cm depth; 0.094cm^2 area and 0.009cm^3 volume. There is no tunneling or undermining noted. There is a none present amount of drainage noted. The wound margin is distinct with the outline attached to the wound base. There is no granulation within the wound bed. There is no necrotic tissue within the wound bed. The periwound skin appearance had no abnormalities noted for texture. The periwound skin appearance had no abnormalities noted for moisture. The periwound skin appearance had no abnormalities noted for color. Periwound temperature was noted as No Abnormality. Wound #9 status is Open. Original cause  of wound was Gradually Appeared. The date acquired was: 07/14/2022. The wound has been in treatment 2 weeks. The wound is located on the [facility] Lower Leg. The wound measures 2.4cm length x 2.2cm width x 0.2cm depth; 4.147cm^2 area and 0.829cm^3 volume. There is Fat Layer (Subcutaneous Tissue) exposed. There is no tunneling or undermining noted. There is a medium amount of serosanguineous drainage noted. The wound margin is distinct with the outline attached to the wound base. There is medium (34-66%) red, pink granulation within the wound bed. There is a medium (34-66%) amount of necrotic tissue within the wound bed including Eschar and Adherent Slough. The periwound skin appearance had no abnormalities noted for texture. The periwound skin appearance exhibited: Hemosiderin Staining. The periwound skin appearance  did not exhibit: [name], [name] ([id number]) [id number] Page 12 of 15 Maceration. Periwound temperature was noted as No Abnormality. 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 There was a Selective/Open Wound Non-Viable Tissue Debridement with a total area of 0.09 sq cm performed by [name], MD. With the following instrument(s): Curette to remove Non-Viable tissue/material. Material removed includes Eschar and Slough and after achieving pain control using Lidocaine 4% T opical Solution. No specimens were taken. A time out was conducted at 12:08, prior to the start of the procedure. A Minimum amount of bleeding was controlled with Pressure. The procedure was tolerated well. Post Debridement Measurements: 0.4cm length x 0.3cm width x 0.1cm depth; 0.009cm^3 volume. Character of Wound/Ulcer Post Debridement is improved. Severity of Tissue Post Debridement is: Fat layer exposed. Post procedure Diagnosis Wound #8: Same as Pre-Procedure General Notes: scribed for Dr. Lady Gary by Samuella Bruin, RN. Wound #9 Pre-procedure diagnosis of Wound #9 is a Vasculitis located on the Left,Distal,Anterior Lower Leg . There was a Selective/Open Wound Non-Viable Tissue Debridement with a total area of 4.14 sq cm performed by [name], MD. With the following instrument(s): Curette to remove Non-Viable tissue/material. Material removed includes Eschar and Slough and after achieving pain control using Lidocaine 4% T opical Solution. No specimens were taken. A time out was conducted at 12:08, prior to the start of the  procedure. A Minimum amount of bleeding was controlled with Pressure. The procedure was tolerated well. Post Debridement Measurements: 2.4cm length x 2.2cm width x 0.2cm depth; 0.829cm^3 volume. Character of Wound/Ulcer Post Debridement is improved. Post procedure Diagnosis Wound #9: Same as Pre-Procedure General Notes: scribed for Dr. Lady Gary by Samuella Bruin, RN. 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 There was a Double Layer Compression Therapy Procedure by Samuella Bruin, RN. Post procedure Diagnosis Wound #9: Same as Pre-Procedure Plan Follow-up Appointments: Return Appointment in 1 week. - Dr. Nada Maclachlan 10/01/22 9:15am Room 3 ; 10/08/22 at 9:30am Room 3 Nurse Visit: - Monday 09/24/22 at 9:15am Room 6 Discharge From [facility] Services: Discharge from Wound Care Center - Please keep wearing compression stockings. Anesthetic: (In clinic) Topical Lidocaine 4% applied to wound bed Bathing/ Shower/ Hygiene: May shower with protection but do not get wound dressing(s) wet. Protect dressing(s) with water repellant cover (for example, large plastic bag) or a cast cover and may then take shower. The following medication(s) was prescribed: lidocaine topical  4 % cream cream topical was prescribed at facility WOUND #8: - Lower Leg Wound Laterality: Left, Anterior, Proximal Cleanser: Vashe 5.8 (oz) 1 x Per Day/30 Days Discharge Instructions: Cleanse the wound with Vashe prior to applying a clean dressing using gauze sponges, not tissue or cotton balls. Topical: Mupirocin Ointment 1 x Per Day/30 Days Discharge Instructions: Apply Mupirocin (Bactroban) as instructed Prim Dressing: Maxorb Extra Ag+ Alginate Dressing, 4x4.75 (in/in) 1 x Per Day/30 Days ary Discharge Instructions: Apply to wound bed as instructed Secondary Dressing: ABD Pad, 8x10 1 x Per Day/30 Days Discharge Instructions: Apply over primary dressing as directed. Com  pression Wrap: FourPress (4 layer compression wrap) 1 x Per Day/30 Days Discharge Instructions: Apply four layer compression as directed. May also use Urgo K2 compression system as alternative. WOUND #9: - Lower Leg Wound Laterality: Left, Anterior, Distal Cleanser: Vashe 5.8 (oz) 1 x Per Day/30 Days Discharge Instructions: Cleanse the wound with Vashe prior to applying a clean dressing using gauze sponges, not tissue or cotton balls. Topical: Mupirocin Ointment 1 x Per Day/30 Days Discharge Instructions: Apply Mupirocin (Bactroban) as instructed Prim Dressing: Maxorb Extra Ag+ Alginate Dressing, 4x4.75 (in/in) 1 x Per Day/30 Days ary Discharge Instructions: Apply to wound bed as instructed Secondary Dressing: ABD Pad, 8x10 1 x Per Day/30 Days Discharge Instructions: Apply over primary dressing as directed. Com pression Wrap: FourPress (4 layer compression wrap) 1 x Per Day/30 Days Discharge Instructions: Apply four layer compression as directed. May also use Urgo K2 compression system as alternative. [name], ROHRS ([id number]) [id number] Page 13 of 15 09/17/2022: The proximal wound has eschar on the surface. Underneath, there is a little bit of slough. The fibrotic surface of the main wound has not really improved, although there is some granulation tissue in the center. Edema control is good. I used a curette to debride slough and eschar from her wounds. We will continue mupirocin with silver alginate and 4-layer compression equivalent. Follow-up in 1 week. Electronic Signature(s) Signed: 09/17/2022 12:24:18 PM By: [name] MD FACS Entered By: [name] on 09/17/2022 12:24:17 -------------------------------------------------------------------------------- HxROS Details Patient Name: Date of Service: Earnest Rosier NITA [name] 09/17/2022 11:30 A M Medical Record Number: [id number] Patient Account Number: [id number] Date of Birth/Sex: Treating  RN: [birth year] (67 y.o. F) Primary Care Provider: [name] Other Clinician: Referring Provider: Treating Provider/Extender: Jasmine Pang in Treatment: 2 Information Obtained From Patient Constitutional Symptoms (General Health) Medical History: Past Medical History Notes: morbid obesity Eyes Medical History: Positive for: Cataracts Hematologic/Lymphatic Medical History: Positive for: Lymphedema Cardiovascular Medical History: Positive for: Hypertension; Peripheral Venous Disease Past Medical History Notes: schamberg disease, hyperlipidemia Gastrointestinal Medical History: Past Medical History Notes: diverticulitis , h/o obstruction due to diverticulitis , colostomy and colostomy reversal Endocrine Medical History: Past Medical History Notes: hypothyroidism Integumentary (Skin) Medical History: Negative for: History of Burn Musculoskeletal Medical History: Positive for: TWYLA, VASKO ([id number]) [id number] Page 14 of 15 HBO Extended History Items Eyes: Cataracts Immunizations Pneumococcal Vaccine: Received Pneumococcal Vaccination: Yes Received Pneumococcal Vaccination On or After 60th Birthday: Yes Implantable Devices None Hospitalization / Surgery History Type of Hospitalization/Surgery colostomy reversal 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 Family and Social History Cancer: No; Diabetes: No; Heart Disease: Yes - Mother,Father; Hereditary Spherocytosis: No; Hypertension: Yes - Mother,Father; Kidney Disease: Yes - Mother; Lung Disease: No; Seizures: No; Stroke: No; Thyroid Problems: Yes - Mother; Tuberculosis: No; Never smoker; Marital Status - Single; Alcohol Use: Never; Drug Use: No History; Caffeine Use: Daily - coffee;  Financial Concerns: No; Food, Clothing or Shelter Needs: No; Support System Lacking: No; Transportation Concerns: No Electronic Signature(s) Signed: 09/17/2022 12:37:08  PM By: [name] MD FACS Entered By: [name] on 09/17/2022 12:23:01 -------------------------------------------------------------------------------- SuperBill Details Patient Name: Date of Service: Erin Philips [name] 09/17/2022 Medical Record Number: [id number] Patient Account Number: [id number] Date of Birth/Sex: Treating RN: [birth year] (67 y.o. 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 with other specif Modifier: ied severity Quantity: 1 Physician Procedures : CPT4 Code Description Modifier [id number] 99214 - WC PHYS LEVEL 4 - EST PT 25 ICD-10 Diagnosis Description L97.928 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

## 2022-09-20 ENCOUNTER — Ambulatory Visit: Payer: Commercial Managed Care - PPO | Admitting: Internal Medicine

## 2022-09-21 ENCOUNTER — Other Ambulatory Visit (HOSPITAL_COMMUNITY): Payer: Self-pay

## 2022-09-24 ENCOUNTER — Encounter (HOSPITAL_BASED_OUTPATIENT_CLINIC_OR_DEPARTMENT_OTHER): Payer: Commercial Managed Care - PPO | Admitting: Internal Medicine

## 2022-09-24 ENCOUNTER — Other Ambulatory Visit (HOSPITAL_COMMUNITY): Payer: Self-pay

## 2022-09-24 DIAGNOSIS — L97928 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: Secondary | ICD-10-CM | POA: Diagnosis not present

## 2022-09-25 ENCOUNTER — Other Ambulatory Visit (HOSPITAL_COMMUNITY): Payer: Self-pay

## 2022-09-28 NOTE — Progress Notes (Signed)
KOHEN, SENFT ([id number]) [id number] Page 1 of 1 Visit Report for 09/24/2022 SuperBill Details Patient Name: Date of Service: [name], [name] 09/24/2022 Medical Record Number: [id number] Patient Account Number: [id number] Date of Birth/Sex: Treating RN: 1955-06-26 (67 y.o. 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

## 2022-09-28 NOTE — Progress Notes (Signed)
EMERA, SPATH ([id number]) [id number] Page 1 of 5 Visit Report for 09/24/2022 Arrival Information Details Patient Name: Date of Service: [name], [name] 09/24/2022 9:15 A M Medical Record Number: [id number] Patient Account Number: [id number] Date of Birth/Sex: Treating RN: 03/29/1955 (67 y.o. F) Primary Care Jerek Meulemans: [name] Other Clinician: Referring Wilna Pennie: Treating Sera Hitsman/Extender: Tona Sensing in Treatment: 3 Visit Information History Since Last Visit Added or deleted any medications: No Patient Arrived: Walker Any new allergies or adverse reactions: No Arrival Time: 09:58 Had a fall or experienced change in No Accompanied By: self activities of daily living that may affect Transfer Assistance: None risk of falls: Patient Identification Verified: Yes Signs or symptoms of abuse/neglect since last visito No Secondary Verification Process Completed: Yes Hospitalized since last visit: No Patient Requires Transmission-Based Precautions: No Implantable device outside of the clinic excluding No Patient Has Alerts: No cellular tissue based products placed in the center since last visit: Has Dressing in Place as Prescribed: Yes Has Compression in Place as Prescribed: Yes Pain Present Now: No Electronic Signature(s) Signed: 09/28/2022 12:35:38 PM By: Thayer Dallas Entered By: Thayer Dallas on 09/24/2022 09:59:05 -------------------------------------------------------------------------------- Compression Therapy Details Patient Name: Date of Service: Erin Philips [name] 09/24/2022 9:15 A M Medical Record Number: [id number] Patient Account Number: [id number] Date of Birth/Sex: Treating RN: 1955/11/17 (67 y.o. F) Primary Care Avelina Mcclurkin: [name] Other Clinician: Referring Dawayne Ohair: Treating Johnesha Acheampong/Extender: Tona Sensing in Treatment: 3 Compression Therapy Performed for Wound  Assessment: Wound #8 Left,Proximal,Anterior Lower Leg Performed By: Clinician Thayer Dallas, Compression Type: Double Layer Electronic Signature(s) Signed: 09/28/2022 12:35:38 PM By: Thayer Dallas Entered By: Thayer Dallas on 09/24/2022 10:00:06 -------------------------------------------------------------------------------- Compression Therapy Details Patient Name: Date of Service: Erin Philips [name] 09/24/2022 9:15 A M Medical Record Number: [id number] Patient Account Number: [id number] [name], [name] ([id number]) [phone].pdf Page 2 of 5 Date of Birth/Sex: Treating RN: 11-18-1955 (67 y.o. F) Primary Care Roxie Kreeger: Other Clinician: [name] Referring Alee Gressman: Treating Priscillia Fouch/Extender: Tona Sensing in Treatment: 3 Compression Therapy Performed for Wound Assessment: Wound #9 Left,Distal,Anterior Lower Leg Performed By: Clinician Thayer Dallas, Compression Type: Double Layer Electronic Signature(s) Signed: 09/28/2022 12:35:38 PM By: Thayer Dallas Entered By: Thayer Dallas on 09/24/2022 10:00:06 -------------------------------------------------------------------------------- Encounter Discharge Information Details Patient Name: Date of Service: Erin Philips [name] 09/24/2022 9:15 A M Medical Record Number: [id number] Patient Account Number: [id number] Date of Birth/Sex: Treating RN: November 13, 1955 (67 y.o. F) Primary Care Chyane Greer: [name] Other Clinician: Thayer Dallas Referring Chayna Surratt: Treating Merwin Breden/Extender: Tona Sensing in Treatment: 3 Encounter Discharge Information Items Discharge Condition: Stable Ambulatory Status: Walker Discharge Destination: Home Transportation: Private Auto Accompanied By: self Schedule Follow-up Appointment: No Clinical Summary of Care: Electronic Signature(s) Signed: 09/28/2022 12:35:38 PM By: Thayer Dallas Entered By: Thayer Dallas on  09/24/2022 10:00:58 -------------------------------------------------------------------------------- Patient/Caregiver Education Details Patient Name: Date of Service: [name] 8/12/2024andnbsp9:15 A M Medical Record Number: [id number] Patient Account Number: [id number] Date of Birth/Gender: Treating RN: 1955-08-08 (68 y.o. F) Primary Care Physician: [name] Other Clinician: Thayer Dallas Referring Physician: Treating Physician/Extender: Tona Sensing in Treatment: 3 Education Assessment Education Provided To: Patient Education Topics Provided Electronic Signature(s) Signed: 09/28/2022 12:35:38 PM By: Thayer Dallas Entered By: Thayer Dallas on 09/24/2022 10:00:37 [name], [name] ([id number]) [id number] Page 3 of 5 -------------------------------------------------------------------------------- Wound Assessment Details Patient Name: Date of Service: [name], [name] 09/24/2022 9:15 A M Medical Record Number: [id number] Patient Account Number: [id number]  Date of Birth/Sex: Treating RN: 1956/01/19 (67 y.o. F) Primary Care Johnnie Moten: [name] Other Clinician: Referring Patience Nuzzo: Treating Kalik Hoare/Extender: Tona Sensing in Treatment: 3 Wound Status Wound Number: 8 Primary Etiology: Venous Leg Ulcer Wound Location: Left, Proximal, Anterior Lower Leg Wound Status: Open Wounding Event: Gradually Appeared Date Acquired: 07/14/2022 Weeks Of Treatment: 3 Clustered Wound: No Wound Measurements Length: (cm) 0.4 Width: (cm) 0.3 Depth: (cm) 0.1 Area: (cm) 0.094 Volume: (cm) 0.009 % Reduction in Area: 98.1% % Reduction in Volume: 99.4% Wound Description Classification: Full Thickness Without Exposed Support S Exudate Amount: None Present tructures Periwound Skin Texture Texture Color No Abnormalities Noted: No No Abnormalities Noted: No Moisture No Abnormalities Noted:  No Treatment Notes Wound #8 (Lower Leg) Wound Laterality: Left, Anterior, Proximal Cleanser Vashe 5.8 (oz) Discharge Instruction: Cleanse the wound with Vashe prior to applying a clean dressing using gauze sponges, not tissue or cotton balls. Peri-Wound Care Topical Mupirocin Ointment Discharge Instruction: Apply Mupirocin (Bactroban) as instructed Primary Dressing Maxorb Extra Ag+ Alginate Dressing, 4x4.75 (in/in) Discharge Instruction: Apply to wound bed as instructed Secondary Dressing ABD Pad, 8x10 Discharge Instruction: Apply over primary dressing as directed. Secured With Compression Wrap FourPress (4 layer compression wrap) Discharge Instruction: Apply four layer compression as directed. May also use Urgo K2 compression system as alternative. Compression Stockings Add-Ons Electronic Signature(s) Signed: 09/28/2022 12:35:38 PM By: Benay Pillow [name] ([id number]) PM By: Thayer Dallas [phone].pdf Page 4 of 5 Signed: 09/28/2022 12:35:38 Entered By: Thayer Dallas on 09/24/2022 09:59:37 -------------------------------------------------------------------------------- Wound Assessment Details Patient Name: Date of Service: [name], [name] 09/24/2022 9:15 A M Medical Record Number: [id number] Patient Account Number: [id number] Date of Birth/Sex: Treating RN: 22-Jul-1955 (67 y.o. F) Primary Care Camry Theiss: [name] Other Clinician: Referring Margery Szostak: Treating [name]: Tona Sensing in Treatment: 3 Wound Status Wound Number: 9 Primary Etiology: Vasculitis Wound Location: Left, Distal, Anterior Lower Leg Wound Status: Open Wounding Event: Gradually Appeared Date Acquired: 07/14/2022 Weeks Of Treatment: 3 Clustered Wound: No Wound Measurements Length: (cm) 2.4 Width: (cm) 2.2 Depth: (cm) 0.2 Area: (cm) 4.147 Volume: (cm) 0.829 % Reduction in Area: 56% % Reduction in Volume: 12% Wound  Description Classification: Full Thickness Without Exposed Suppo Exudate Amount: Medium Exudate Type: Serosanguineous Exudate Color: red, brown rt Structures Periwound Skin Texture Texture Color No Abnormalities Noted: No No Abnormalities Noted: No Moisture No Abnormalities Noted: No Treatment Notes Wound #9 (Lower Leg) Wound Laterality: Left, Anterior, Distal Cleanser Vashe 5.8 (oz) Discharge Instruction: Cleanse the wound with Vashe prior to applying a clean dressing using gauze sponges, not tissue or cotton balls. Peri-Wound Care Topical Mupirocin Ointment Discharge Instruction: Apply Mupirocin (Bactroban) as instructed Primary Dressing Maxorb Extra Ag+ Alginate Dressing, 4x4.75 (in/in) Discharge Instruction: Apply to wound bed as instructed Secondary Dressing ABD Pad, 8x10 Discharge Instruction: Apply over primary dressing as directed. Secured With Compression Wrap FourPress (4 layer compression wrap) Discharge Instruction: Apply four layer compression as directed. May also use Urgo K2 compression system as alternative. LEXIA, BALMES ([id number]) [id number] Page 5 of 5 Compression Stockings Add-Ons Electronic Signature(s) Signed: 09/28/2022 12:35:38 PM By: Thayer Dallas Entered By: Thayer Dallas on 09/24/2022 09:59:37 -------------------------------------------------------------------------------- Vitals Details Patient Name: Date of Service: Earnest Rosier NITA [name] 09/24/2022 9:15 A M Medical Record Number: [id number] Patient Account Number: [id number] Date of Birth/Sex: Treating RN: 03-Oct-1955 (67 y.o. F) Primary Care Kailia Starry: [name] Other Clinician: Referring Keats Kingry: Treating Palmina Clodfelter/Extender: Tona Sensing in Treatment: 3 Vital Signs Time Taken:  09:59 Reference Range: 80 - 120 mg / dl Height (in): 62 Weight (lbs): 221 Body Mass Index (BMI): 40.4 Electronic Signature(s) Signed: 09/28/2022  12:35:38 PM By: Thayer Dallas Entered By: Thayer Dallas on 09/24/2022 09:59:15

## 2022-10-01 ENCOUNTER — Encounter (HOSPITAL_BASED_OUTPATIENT_CLINIC_OR_DEPARTMENT_OTHER): Payer: Commercial Managed Care - PPO | Admitting: General Surgery

## 2022-10-01 ENCOUNTER — Other Ambulatory Visit (HOSPITAL_COMMUNITY): Payer: Self-pay

## 2022-10-01 ENCOUNTER — Ambulatory Visit: Payer: Commercial Managed Care - PPO | Admitting: Internal Medicine

## 2022-10-01 ENCOUNTER — Encounter: Payer: Self-pay | Admitting: Internal Medicine

## 2022-10-01 VITALS — BP 122/74 | HR 88 | Temp 97.9°F | Ht 62.0 in | Wt 227.4 lb

## 2022-10-01 DIAGNOSIS — I739 Peripheral vascular disease, unspecified: Secondary | ICD-10-CM

## 2022-10-01 DIAGNOSIS — I119 Hypertensive heart disease without heart failure: Secondary | ICD-10-CM

## 2022-10-01 DIAGNOSIS — L97928 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: Secondary | ICD-10-CM | POA: Diagnosis not present

## 2022-10-01 DIAGNOSIS — M25562 Pain in left knee: Secondary | ICD-10-CM

## 2022-10-01 DIAGNOSIS — Z6841 Body Mass Index (BMI) 40.0 and over, adult: Secondary | ICD-10-CM

## 2022-10-01 DIAGNOSIS — E66813 Obesity, class 3: Secondary | ICD-10-CM

## 2022-10-01 DIAGNOSIS — G8929 Other chronic pain: Secondary | ICD-10-CM | POA: Diagnosis not present

## 2022-10-01 DIAGNOSIS — M25561 Pain in right knee: Secondary | ICD-10-CM

## 2022-10-01 DIAGNOSIS — I10 Essential (primary) hypertension: Secondary | ICD-10-CM | POA: Diagnosis not present

## 2022-10-01 DIAGNOSIS — E039 Hypothyroidism, unspecified: Secondary | ICD-10-CM | POA: Diagnosis not present

## 2022-10-01 DIAGNOSIS — L03311 Cellulitis of abdominal wall: Secondary | ICD-10-CM | POA: Diagnosis not present

## 2022-10-01 DIAGNOSIS — R7309 Other abnormal glucose: Secondary | ICD-10-CM | POA: Diagnosis not present

## 2022-10-01 DIAGNOSIS — I358 Other nonrheumatic aortic valve disorders: Secondary | ICD-10-CM | POA: Diagnosis not present

## 2022-10-01 DIAGNOSIS — L958 Other vasculitis limited to the skin: Secondary | ICD-10-CM | POA: Diagnosis not present

## 2022-10-01 MED ORDER — LEVOTHYROXINE SODIUM 112 MCG PO TABS
112.0000 ug | ORAL_TABLET | Freq: Every day | ORAL | 1 refills | Status: DC
  Filled 2022-10-01 – 2022-12-20 (×2): qty 90, 90d supply, fill #0
  Filled 2023-05-02: qty 90, 90d supply, fill #1

## 2022-10-01 MED ORDER — AMOXICILLIN-POT CLAVULANATE 875-125 MG PO TABS
1.0000 | ORAL_TABLET | Freq: Two times a day (BID) | ORAL | 0 refills | Status: DC
  Filled 2022-10-01: qty 20, 10d supply, fill #0

## 2022-10-01 NOTE — Progress Notes (Signed)
RAPHAELLA, HASKIN ([id number]) [id number] Page 1 of 8 Visit Report for 10/01/2022 Arrival Information Details Patient Name: Date of Service: [name], [name] 10/01/2022 9:15 A M Medical Record Number: [id number] Patient Account Number: [id number] Date of Birth/Sex: Treating RN: March 04, 1955 (67 y.o. [name] Primary Care Noreen Mackintosh: [name] Other Clinician: Referring Carlise [name]: Treating Christophe Rising/Extender: Jasmine Pang in Treatment: 4 Visit Information History Since Last Visit Added or deleted any medications: No Patient Arrived: Dan Humphreys Any new allergies or adverse reactions: No Arrival Time: 09:40 Had a fall or experienced change in No Accompanied By: self activities of daily living that may affect Transfer Assistance: None risk of falls: Patient Identification Verified: Yes Signs or symptoms of abuse/neglect since last visito No Patient Requires Transmission-Based Precautions: No Hospitalized since last visit: No Patient Has Alerts: No Implantable device outside of the clinic excluding No cellular tissue based products placed in the center since last visit: Has Dressing in Place as Prescribed: Yes Has Compression in Place as Prescribed: Yes Pain Present Now: No Electronic Signature(s) Signed: 10/01/2022 11:43:17 AM By: Karie Schwalbe RN Entered By: Karie Schwalbe on 10/01/2022 06:55:12 -------------------------------------------------------------------------------- Compression Therapy Details Patient Name: Date of Service: Erin Philips [name] 10/01/2022 9:15 A M Medical Record Number: [id number] Patient Account Number: [id number] Date of Birth/Sex: Treating RN: [birth year] (67 y.o. [name] Primary Care Serria Sloma: [name] Other Clinician: Referring Laya Letendre: Treating Terrance Usery/Extender: Jasmine Pang in Treatment: 4 Compression Therapy Performed for Wound Assessment:  Wound #9 Left,Distal,Anterior Lower Leg Performed By: Clinician Karie Schwalbe, RN Compression Type: Four Layer Post Procedure Diagnosis Same as Pre-procedure Electronic Signature(s) Signed: 10/01/2022 11:43:17 AM By: Karie Schwalbe RN Entered By: Karie Schwalbe on 10/01/2022 07:44:14 LATECHIA, SPERDUTO [name] ([id number]) [id number] Page 2 of 8 -------------------------------------------------------------------------------- Encounter Discharge Information Details Patient Name: Date of Service: [name], [name] 10/01/2022 9:15 A M Medical Record Number: [id number] Patient Account Number: [id number] Date of Birth/Sex: Treating RN: 1956-01-02 (67 y.o. [name] Primary Care Zlata Alcaide: [name] Other Clinician: Referring Deserae Jennings: Treating Jaydence Vanyo/Extender: Jasmine Pang in Treatment: 4 Encounter Discharge Information Items Post Procedure Vitals Discharge Condition: Stable Temperature (F): 98.6 Ambulatory Status: Walker Pulse (bpm): 80 Discharge Destination: Home Respiratory Rate (breaths/min): 18 Transportation: Private Auto Blood Pressure (mmHg): 126/74 Accompanied By: self Schedule Follow-up Appointment: Yes Clinical Summary of Care: Patient Declined Electronic Signature(s) Signed: 10/01/2022 11:43:17 AM By: Karie Schwalbe RN Entered By: Karie Schwalbe on 10/01/2022 07:46:01 -------------------------------------------------------------------------------- Lower Extremity Assessment Details Patient Name: Date of Service: [name], [name] 10/01/2022 9:15 A M Medical Record Number: [id number] Patient Account Number: [id number] Date of Birth/Sex: Treating RN: Dec 16, 1955 (67 y.o. [name] Primary Care [place] Goh: [name] Other Clinician: Referring Tiena Manansala: Treating Lyon Dumont/Extender: Jasmine Pang in Treatment: 4 Edema Assessment Assessed: [Left: No] [Right: No] [Left:  Edema] [Right: :] Calf Left: Right: Point of Measurement: 34 cm From Medial Instep 38.5 cm 45.5 cm Ankle Left: Right: Point of Measurement: 9 cm From Medial Instep 22 cm 23 cm Vascular Assessment Pulses: Dorsalis Pedis Palpable: [Left:Yes] Extremity colors, hair growth, and conditions: Hair Growth on Extremity: [Left:No] [Right:No] Temperature of Extremity: [Left:Warm] [Right:Warm] Capillary Refill: [Left:< 3 seconds] [Right:< 3 seconds] Dependent Rubor: [Left:No No] [Right:No No] Toe Nail Assessment Left: Right: Thick: Yes Discolored: No Deformed: Yes Improper Length and Hygiene: No [name], [name] ([id number]) [id number].pdf Page 3 of 8 Electronic Signature(s) Signed: 10/01/2022 11:43:17 AM By: Karie Schwalbe RN Entered By: Karie Schwalbe  on 10/01/2022 06:56:31 -------------------------------------------------------------------------------- Multi Wound Chart Details Patient Name: Date of Service: [name], [name] 10/01/2022 9:15 A M Medical Record Number: [id number] Patient Account Number: [id number] Date of Birth/Sex: Treating RN: 25-Feb-1955 (67 y.o. F) Primary Care [place] Freyre: [name] Other Clinician: Referring Wynnie Pacetti: Treating Shakiara Lukic/Extender: Jasmine Pang in Treatment: 4 Vital Signs Height(in): 62 Pulse(bpm): 80 Weight(lbs): 221 Blood Pressure(mmHg): 126/74 Body Mass Index(BMI): 40.4 Temperature(F): 98.6 Respiratory Rate(breaths/min): 18 [8:Photos:] [N/A:N/A] Left, Proximal, Anterior Lower Leg Left, Distal, Anterior Lower Leg N/A Wound Location: Gradually Appeared Gradually Appeared N/A Wounding Event: Venous Leg Ulcer Vasculitis N/A Primary Etiology: Cataracts, Lymphedema, Cataracts, Lymphedema, N/A Comorbid History: Hypertension, Peripheral Venous Hypertension, Peripheral Venous Disease, Osteoarthritis Disease, Osteoarthritis 07/14/2022 07/14/2022 N/A Date Acquired: 4 4 N/A Weeks of  Treatment: Healed - Epithelialized Open N/A Wound Status: No No N/A Wound Recurrence: 0x0x0 1.7x1.7x0.2 N/A Measurements L x W x [name] 0 2.27 N/A A (cm) : rea 0 0.454 N/A Volume (cm) : 100.00% 75.90% N/A % Reduction in A rea: 100.00% 51.80% N/A % Reduction in Volume: Full Thickness Without Exposed Full Thickness Without Exposed N/A Classification: Support Structures Support Structures None Present Medium N/A Exudate A mount: N/A Serosanguineous N/A Exudate Type: N/A red, brown N/A Exudate Color: None Present (0%) Medium (34-66%) N/A Granulation A mount: N/A Red N/A Granulation Quality: None Present (0%) Medium (34-66%) N/A Necrotic A mount: N/A Eschar, Adherent Slough N/A Necrotic Tissue: Fascia: No Fat Layer (Subcutaneous Tissue): Yes N/A Exposed Structures: Fat Layer (Subcutaneous Tissue): No Fascia: No Tendon: No Tendon: No Muscle: No Muscle: No Joint: No Joint: No Bone: No Bone: No Large (67-100%) Small (1-33%) N/A Epithelialization: N/A Debridement - Selective/Open Wound N/A Debridement: Pre-procedure Verification/Time Out N/A 10:11 N/A Taken: N/A Lidocaine 4% Topical Solution N/A Pain Control: N/A Slough N/A Tissue Debrided: N/A Non-Viable Tissue N/A Level: N/A 2.27 N/A Debridement A (sq cm): rea N/A Curette N/A Instrument: N/A Minimum N/A Bleeding: ABI, BAAS ([id number]) [id number].pdf Page 4 of 8 N/A Pressure N/A Hemostasis A chieved: N/A 0 N/A Procedural Pain: N/A 0 N/A Post Procedural Pain: N/A Procedure was tolerated well N/A Debridement Treatment Response: N/A 1.7x1.7x0.2 N/A Post Debridement Measurements L x W x [name] N/A 0.454 N/A Post Debridement Volume: (cm) Scarring: No Scarring: Yes N/A Periwound Skin Texture: No Abnormalities Noted No Abnormalities Noted N/A Periwound Skin Moisture: Hemosiderin Staining: No Hemosiderin Staining: Yes N/A Periwound Skin Color: No Abnormality No  Abnormality N/A Temperature: N/A Debridement N/A Procedures Performed: Treatment Notes Electronic Signature(s) Signed: 10/01/2022 10:15:16 AM By: [name] MD FACS Entered By: [name] on 10/01/2022 07:15:16 -------------------------------------------------------------------------------- Multi-Disciplinary Care Plan Details Patient Name: Date of Service: Erin Philips [name] 10/01/2022 9:15 A M Medical Record Number: [id number] Patient Account Number: [id number] Date of Birth/Sex: Treating RN: 30-Sep-1955 (67 y.o. [name] Primary Care Tracen Mahler: [name] Other Clinician: Referring Val Schiavo: Treating Dearl Rudden/Extender: Jasmine Pang in Treatment: 4 Active Inactive Wound/Skin Impairment Nursing Diagnoses: Impaired tissue integrity Goals: Patient/caregiver will verbalize understanding of skin care regimen Date Initiated: 09/03/2022 Target Resolution Date: 12/12/2022 Goal Status: Active Interventions: Assess patient/caregiver ability to obtain necessary supplies Assess patient/caregiver ability to perform ulcer/skin care regimen upon admission and as needed Assess ulceration(s) every visit Provide education on ulcer and skin care Screen for HBO Treatment Activities: Skin care regimen initiated : 08/31/2022 Topical wound management initiated : 08/31/2022 Notes: Electronic Signature(s) Signed: 10/01/2022 11:43:17 AM By: Karie Schwalbe RN Entered By: Karie Schwalbe on 10/01/2022 07:44:35 [name],  [name] ([id number]) [id number] Page 5 of 8 -------------------------------------------------------------------------------- Pain Assessment Details Patient Name: Date of Service: [name], [name] 10/01/2022 9:15 A M Medical Record Number: [id number] Patient Account Number: [id number] Date of Birth/Sex: Treating RN: [birth year] (67 y.o. [name] Primary Care Zedrick Springsteen: [name] Other  Clinician: Referring Valia Wingard: Treating Trenell Moxey/Extender: Jasmine Pang in Treatment: 4 Active Problems Location of Pain Severity and Description of Pain Patient Has Paino No Site Locations Pain Management and Medication Current Pain Management: Electronic Signature(s) Signed: 10/01/2022 11:43:17 AM By: Karie Schwalbe RN Entered By: Karie Schwalbe on 10/01/2022 06:56:02 -------------------------------------------------------------------------------- Patient/Caregiver Education Details Patient Name: Date of Service: [name] 8/19/2024andnbsp9:15 A M Medical Record Number: [id number] Patient Account Number: [id number] Date of Birth/Gender: Treating RN: Oct 02, 1955 (67 y.o. [name] Primary Care Physician: [name] Other Clinician: Referring Physician: Treating Physician/Extender: Jasmine Pang in Treatment: 4 Education Assessment Education Provided To: Patient Education Topics Provided Wound/Skin Impairment: Methods: Explain/Verbal Responses: Return demonstration correctly Electronic Signature(s) Signed: 10/01/2022 11:43:17 AM By: Karie Schwalbe RN Entered By: Karie Schwalbe on 10/01/2022 07:44:56 SAVANNAH, MEIR [name] ([id number]) [id number] Page 6 of 8 -------------------------------------------------------------------------------- Wound Assessment Details Patient Name: Date of Service: [name], [name] 10/01/2022 9:15 A M Medical Record Number: [id number] Patient Account Number: [id number] Date of Birth/Sex: Treating RN: [birth year] (67 y.o. [name] Primary Care Jenise Iannelli: [name] Other Clinician: Referring Maille Halliwell: Treating Dejuan Elman/Extender: Jasmine Pang in Treatment: 4 Wound Status Wound Number: 8 Primary Venous Leg Ulcer Etiology: Wound Location: Left, Proximal, Anterior Lower Leg Wound Healed - Epithelialized Wounding  Event: Gradually Appeared Status: Date Acquired: 07/14/2022 Comorbid Cataracts, Lymphedema, Hypertension, Peripheral Venous Weeks Of Treatment: 4 History: Disease, Osteoarthritis Clustered Wound: No Photos Wound Measurements Length: (cm) Width: (cm) Depth: (cm) Area: (cm) Volume: (cm) 0 % Reduction in Area: 100% 0 % Reduction in Volume: 100% 0 Epithelialization: Large (67-100%) 0 Tunneling: No 0 Undermining: No Wound Description Classification: Full Thickness Without Exposed Support Exudate Amount: None Present Structures Foul Odor After Cleansing: No Slough/Fibrino No Wound Bed Granulation Amount: None Present (0%) Exposed Structure Necrotic Amount: None Present (0%) Fascia Exposed: No Fat Layer (Subcutaneous Tissue) Exposed: No Tendon Exposed: No Muscle Exposed: No Joint Exposed: No Bone Exposed: No Periwound Skin Texture Texture Color No Abnormalities Noted: Yes No Abnormalities Noted: Yes Moisture Temperature / Pain No Abnormalities Noted: Yes Temperature: No Abnormality Electronic Signature(s) Signed: 10/01/2022 11:43:17 AM By: Karie Schwalbe RN Entered By: Karie Schwalbe on 10/01/2022 07:15:13 [name] ([id number]) [id number] Page 7 of 8 -------------------------------------------------------------------------------- Wound Assessment Details Patient Name: Date of Service: [name], [name] 10/01/2022 9:15 A M Medical Record Number: [id number] Patient Account Number: [id number] Date of Birth/Sex: Treating RN: January 26, 1956 (67 y.o. [name] Primary Care Tevis Dunavan: [name] Other Clinician: Referring Errika Narvaiz: Treating Camaryn Lumbert/Extender: Jasmine Pang in Treatment: 4 Wound Status Wound Number: 9 Primary Vasculitis Etiology: Wound Location: Left, Distal, Anterior Lower Leg Wound Open Wounding Event: Gradually Appeared Status: Date Acquired: 07/14/2022 Comorbid Cataracts, Lymphedema,  Hypertension, Peripheral Venous Weeks Of Treatment: 4 History: Disease, Osteoarthritis Clustered Wound: No Photos Wound Measurements Length: (cm) 1.7 Width: (cm) 1.7 Depth: (cm) 0.2 Area: (cm) 2.27 Volume: (cm) 0.454 % Reduction in Area: 75.9% % Reduction in Volume: 51.8% Epithelialization: Small (1-33%) Tunneling: No Undermining: No Wound Description Classification: Full Thickness Without Exposed Support Structures Exudate Amount: Medium Exudate Type: Serosanguineous Exudate Color: red, brown Foul Odor After Cleansing: No Slough/Fibrino Yes  Wound Bed Granulation Amount: Medium (34-66%) Exposed Structure Granulation Quality: Red Fascia Exposed: No Necrotic Amount: Medium (34-66%) Fat Layer (Subcutaneous Tissue) Exposed: Yes Necrotic Quality: Eschar, Adherent Slough Tendon Exposed: No Muscle Exposed: No Joint Exposed: No Bone Exposed: No Periwound Skin Texture Texture Color No Abnormalities Noted: No No Abnormalities Noted: No Scarring: Yes Hemosiderin Staining: Yes Moisture Temperature / Pain No Abnormalities Noted: Yes Temperature: No Abnormality Treatment Notes Wound #9 (Lower Leg) Wound Laterality: Left, Anterior, Distal Cleanser Vashe 5.8 (oz) Discharge Instruction: Cleanse the wound with Vashe prior to applying a clean dressing using gauze sponges, not tissue or cotton balls. MARIONA, BRUMMELL ([id number]) [id number] Page 8 of 8 Peri-Wound Care Topical Mupirocin Ointment Discharge Instruction: Apply Mupirocin (Bactroban) as instructed Primary Dressing Maxorb Extra Ag+ Alginate Dressing, 4x4.75 (in/in) Discharge Instruction: Apply to wound bed as instructed Secondary Dressing ABD Pad, 8x10 Discharge Instruction: Apply over primary dressing as directed. Secured With Compression Wrap FourPress (4 layer compression wrap) Discharge Instruction: Apply four layer compression as directed. May also use Urgo K2 compression system as  alternative. Compression Stockings Add-Ons Electronic Signature(s) Signed: 10/01/2022 11:43:17 AM By: Karie Schwalbe RN Entered By: Karie Schwalbe on 10/01/2022 07:02:47 -------------------------------------------------------------------------------- Vitals Details Patient Name: Date of Service: Erin Rosier NITA [name] 10/01/2022 9:15 A M Medical Record Number: [id number] Patient Account Number: [id number] Date of Birth/Sex: Treating RN: 05-11-1955 (67 y.o. [name] Primary Care Mcadoo Muzquiz: [name] Other Clinician: Referring Eisa Necaise: Treating Jamilett Ferrante/Extender: Jasmine Pang in Treatment: 4 Vital Signs Time Taken: 09:54 Temperature (F): 98.6 Height (in): 62 Pulse (bpm): 80 Weight (lbs): 221 Respiratory Rate (breaths/min): 18 Body Mass Index (BMI): 40.4 Blood Pressure (mmHg): 126/74 Reference Range: 80 - 120 mg / dl Electronic Signature(s) Signed: 10/01/2022 11:43:17 AM By: Karie Schwalbe RN Entered By: Karie Schwalbe on 10/01/2022 06:55:56

## 2022-10-01 NOTE — Progress Notes (Signed)
I,Victoria T Deloria Lair, CMA,acting as a Neurosurgeon for Gwynneth Aliment, MD.,have documented all relevant documentation on the behalf of Gwynneth Aliment, MD,as directed by  Gwynneth Aliment, MD while in the presence of Gwynneth Aliment, MD.  Subjective:  Patient ID: [name] , female    DOB: 1955-06-08 , 67 y.o.   MRN: [id number]  Chief Complaint  Patient presents with   Hypertension   Hypothyroidism    HPI  She presents today for BP check. She reports compliance with meds. She denies headaches, chest pain and shortness of breath. 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   She admits she has been experiencing pain in her knees,which makes her feel depressed. She states other than that she is okay.   Hypertension This is a chronic problem. The current episode started more than 1 year ago. The problem has been gradually improving since onset. The problem is controlled. Pertinent negatives include no blurred vision, chest pain, palpitations or shortness of breath. Risk factors for coronary artery disease include obesity, sedentary lifestyle and post-menopausal state. Compliance problems include exercise.      Past Medical History:  Diagnosis Date   Arthritis    DOE (dyspnea on exertion)    mild   Hyperlipidemia    Hypertension    Hypothyroidism    Morbid obesity (HCC)    Pre-diabetes    Schamberg disease      Family History  Problem Relation Age of Onset   Hypertension Mother    Heart Problems Mother        Pacemaker   Heart attack Father        at age 32.   Heart attack Maternal Grandfather 63       Died of AAA   AAA (abdominal aortic aneurysm) Maternal Grandfather    Heart attack Maternal Uncle    Stroke Paternal Aunt    Alzheimer's disease Maternal Grandmother      Current Outpatient Medications:    amLODipine (NORVASC) 5 MG tablet, Take 1 tablet (5 mg total) by mouth daily., Disp: 90 tablet, Rfl: 2   amoxicillin-clavulanate (AUGMENTIN) 875-125 MG tablet, Take 1  tablet by mouth 2 (two) times daily., Disp: 20 tablet, Rfl: 0   Calcium Carbonate-Vitamin D 600-400 MG-UNIT tablet, Take 1 tablet by mouth daily., Disp: , Rfl:    Cholecalciferol (VITAMIN D) 2000 units tablet, Take 4,000 Units by mouth daily., Disp: , Rfl:    lisinopril (ZESTRIL) 20 MG tablet, Take 1 tablet (20 mg total) by mouth daily., Disp: 90 tablet, Rfl: 2   Magnesium 400 MG TABS, Take by mouth., Disp: , Rfl:    Multiple Vitamins-Minerals (CENTRUM SILVER 50+WOMEN PO), Take by mouth. 1 per day, Disp: , Rfl:    potassium chloride SA (KLOR-CON M) 20 MEQ tablet, Take 1 tablet (20 mEq total) by mouth daily as needed (when taking Torsemide)., Disp: 90 tablet, Rfl: 1   simvastatin (ZOCOR) 20 MG tablet, Take 1 tablet (20 mg total) by mouth daily., Disp: 90 tablet, Rfl: 1   torsemide (DEMADEX) 20 MG tablet, Take 1 tablet (20 mg total) by mouth daily as needed (for edema)., Disp: 90 tablet, Rfl: 1   levothyroxine (SYNTHROID) 112 MCG tablet, Take 1 tablet (112 mcg total) by mouth daily., Disp: 90 tablet, Rfl: 1   No Known Allergies   Review of Systems  Constitutional: Negative.   Eyes:  Negative for blurred vision.  Respiratory: Negative.  Negative for shortness of breath.  Cardiovascular: Negative.  Negative for chest pain and palpitations.  Neurological: Negative.   Psychiatric/Behavioral: Negative.       Today's Vitals   10/01/22 1521 10/01/22 1542  BP: (!) 148/62 122/74  Pulse: 88   Temp: 97.9 F (36.6 C)   SpO2: 97%   Weight: 227 lb 6.4 oz (103.1 kg)   Height: 5\' 2"  (1.575 m)    Body mass index is 41.59 kg/m.  Wt Readings from Last 3 Encounters:  10/01/22 227 lb 6.4 oz (103.1 kg)  06/19/22 228 lb 3.2 oz (103.5 kg)  05/21/22 224 lb (101.6 kg)    BP Readings from Last 3 Encounters:  10/01/22 122/74  06/19/22 132/74  05/21/22 118/74     Objective:  Physical Exam Vitals and nursing note reviewed.  Constitutional:      Appearance: Normal appearance. She is obese.  HENT:      Head: Normocephalic and atraumatic.  Cardiovascular:     Rate and Rhythm: Normal rate and regular rhythm.     Heart sounds: Normal heart sounds.  Pulmonary:     Effort: Pulmonary effort is normal.     Breath sounds: Normal breath sounds.  Musculoskeletal:     Cervical back: Normal range of motion.     Comments: Ambulatory with walker  Skin:    General: Skin is warm.     Comments: Abdominal wound w/ purulent drainage, surrounding area is erythematous  Neurological:     General: No focal deficit present.     Mental Status: She is alert.  Psychiatric:        Mood and Affect: Mood normal.        Behavior: Behavior normal.         Assessment And Plan:  Hypertensive heart disease without heart failure Assessment & Plan: Chronic, well controlled.  She will continue with lisinopril 20mg  daily, amlodipine 5mg  daily and torsemide prn. She is encouraged to follow a low sodium diet. She will f/u in four to six months for re-evaluation.   Orders: -     Lipid panel -     BMP8+EGFR  Aortic valve sclerosis Assessment & Plan: Echo results from Jan 2023 were reviewed in full detail.     Chronic pain of both knees Assessment & Plan: Her sx are likely due to osteoarthritis. She is now ambulatory with a walker. She may apply topical Voltaren gel to both knees as needed. If her sx persist/worsen, I will refer her to Ortho for further evaluation.    Other abnormal glucose Assessment & Plan: Previous labs reviewed, her A1c has been elevated in the past. I will check an A1c today. Reminded to avoid refined sugars including sugary drinks/foods and processed meats including bacon, sausages and deli meats.    Orders: -     Hemoglobin A1c  Cellulitis of abdominal wall Assessment & Plan: I will send rx Augmentin. Pt encouraged to show Wound care provider area on her abdomen.   Orders: -     CBC with Differential/Platelet  Primary hypothyroidism Assessment & Plan: Chronic, currently  on levothyroxine daily. Importance of medication compliance was discussed with the patient.    Peripheral vascular disease (HCC) Assessment & Plan: Chronic, encouraged to increase daily activity as tolerated.    Class 3 severe obesity due to excess calories with serious comorbidity and body mass index (BMI) of 40.0 to 44.9 in adult [facility]) Assessment & Plan: She is encouraged to initially strive for BMI less than 35 to decrease cardiac  risk. Advised to aim for at least 150 minutes of exercise per week.    Other orders -     Levothyroxine Sodium; Take 1 tablet (112 mcg total) by mouth daily.  Dispense: 90 tablet; Refill: 1 -     Amoxicillin-Pot Clavulanate; Take 1 tablet by mouth 2 (two) times daily.  Dispense: 20 tablet; Refill: 0     Return if symptoms worsen or fail to improve.  Patient was given opportunity to ask questions. Patient verbalized understanding of the plan and was able to repeat key elements of the plan. All questions were answered to their satisfaction.    I, Gwynneth Aliment, MD, have reviewed all documentation for this visit. The documentation on 10/01/22 for the exam, diagnosis, procedures, and orders are all accurate and complete.   IF YOU HAVE BEEN REFERRED TO A SPECIALIST, IT MAY TAKE 1-2 WEEKS TO SCHEDULE/PROCESS THE REFERRAL. IF YOU HAVE NOT HEARD FROM US/SPECIALIST IN TWO WEEKS, PLEASE GIVE [name] A CALL AT [phone] X [phone].   THE PATIENT IS ENCOURAGED TO PRACTICE SOCIAL DISTANCING DUE TO THE COVID-19 PANDEMIC.

## 2022-10-01 NOTE — Progress Notes (Signed)
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 with just a glisten under illumination suggesting that it is not completely healed. The more distal wound is smaller by about half a centimeter. There is slough accumulation on the surface and it is a little bit less fibrotic today. Electronic Signature(s) Signed: 10/01/2022 10:17:45 AM By: [name] MD FACS Entered By: [name] on 10/01/2022 10:17:45 -------------------------------------------------------------------------------- Physician Orders Details Patient Name: Date of Service: Erin Rosier [name] 10/01/2022 9:15 A M Medical Record Number: [id number] Patient Account Number: [id number] Date of Birth/Sex: Treating RN: 10-13-1955 (67 y.o. 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 - Dr. Lady Gary Return A 10/08/22 at 9:30am Room 3 ppointment in 2 weeks. - Please ask front desk for appointments Return A Discharge From [facility] - Please keep wearing compression stockings. Anesthetic (In clinic) Topical Lidocaine 4% applied to wound bed Bathing/ Shower/ Hygiene May shower with protection but do not get wound dressing(s) wet. Protect dressing(s) with water repellant cover (for example, large plastic bag) or a cast cover and may then take shower. Wound Treatment Wound #9 - Lower Leg Wound Laterality: Left, Anterior, Distal Cleanser: Vashe 5.8 (oz) 1 x Per Day/30 Days Discharge Instructions: Cleanse the wound with Vashe prior to applying a clean dressing using gauze sponges, not tissue or cotton balls. Topical: Mupirocin Ointment 1 x Per Day/30 Days Discharge Instructions: Apply Mupirocin (Bactroban) as instructed Prim Dressing: Maxorb Extra Ag+ Alginate Dressing, 4x4.75 (in/in) 1 x Per Day/30 Days ary Discharge Instructions: Apply to wound bed as instructed Secondary Dressing: ABD Pad, 8x10 1 x Per Day/30 Days Discharge Instructions: Apply over primary dressing as directed. Compression Wrap: FourPress (4 layer compression wrap) 1 x Per Day/30 Days Discharge Instructions: Apply four layer compression as directed. May also use Urgo K2 compression system as alternative.  Electronic Signature(s) Signed: 10/01/2022 10:17:56 AM By: [name] MD FACS Entered By: [name] on 10/01/2022 10:17:55 -------------------------------------------------------------------------------- Problem List Details Patient Name: Date of Service: Erin Rosier [name] 10/01/2022 9:15 A M Medical Record Number: [id number] Patient Account Number: [id number] Date of Birth/Sex: Treating RN: Jun 25, 1955 (67 y.o.  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 10/01/2022 9:15 A M Medical Record Number: [id number] Patient Account Number: [id number] Date of Birth/Sex: Treating RN: [birth year] (67 y.o. F) Primary Care Provider: [name] Other Clinician: Referring Provider: Treating Provider/Extender: Jasmine Pang in Treatment: 4 Subjective Chief Complaint Information obtained from Patient 04/19/2021: The patient is here for ongoing follow-up regarding 2 left lower extremity wounds. 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 She tells [name] that she fell on the gravel driveway in July. She developed injuries on her distal lower leg which have not healed. She saw her primary physician on 11/15/2017 who noted her left shin injuries. Gave her antibiotics. At that point the wounds were almost circumferential however most were less than 1.5 cm. Weeping edema fluid was noted. She was  referred here for evaluation. The patient has a history of chronic lower extremity edema. 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 She has had prior history with leg weeping fluid. She does not wear compression stockings. She is not doing anything specific to these wound areas. 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 She also has lymphedema. Put her in 3 layer compression and silver alginate last week. She has 3 small wounds with depth just lateral to the tibia. More problematically than this she has numerous shallow areas some of which are almost canal like in shape with tightly adherent painful debris. It would be very difficult and time- consuming to go through this and attempt to individually debride all these areas.. I changed her to collagen today to see if that would help with any of the surface debris on some of these wounds. Otherwise we will not be able to put this in compression we have to have someone change the dressing. The patient is not eligible for home health 12/25/17 on evaluation today patient actually appears to be doing rather well in regard to the ulcer on her lower extremity. Fortunately there does not appear to be evidence of infection at this time. She has been tolerating the dressing changes without complication. This includes the compression wrap.  The only issue she had was that the wrap was initially placed over her bunion region which actually calls her some discomfort and pain. Other than that things seem to be going rather well. 01/01/2018 Seen today for follow-up and management  of left lower extremity wound and lymphedema. T oday she presents with a new wound towards to the left lateral LE. Recently treated with a 7 day course of amoxicillin; reason for antibiotic dose is unknown at this time. Tolerating current treatment of collagen with 4- layer wraps. She obtained a venous reflux study on 12/25/17. Studies show on the right abnormal reflux times of the popliteal vein, great saphenous vein at the saphenofemoral junction at the proximal thigh, great saphenous vein at the mid calf, and origin of the small saphenous vein.No superficial thrombosis. No deep vein thrombosis in the common femoral, femoral,and popliteal veins. Left abnormal reflex times as well of the common femoral vein, popliteal vein, and a great saphenous vein at the saphenofemoral junction, In great saphenous vein at the mid thigh w/o thrombosis. Has any issues or concerns during visit today. Recommended follow-up to vascular specialist due to abnormalities from the venous reflux study. Denies fever, pain, chills, dizziness, nausea, or vomiting. 01/08/18 upon evaluation today the patient actually seems to be showing some signs of improvement in my opinion at this point in regard to the lower extremity ulcerated areas. She still has a lot of drainage but fortunately nothing that appears to be too significant currently. I have been very happy with the overall progress I see today compared to where things were during the last evaluation that I had with her. Nonetheless she has not had her appointment with the vein specialist as of yet in fact we were able to get this approved for her today and confirmed with them she will be seeing them on December 26. Nonetheless in general I do feel like the compression wraps is doing well for her. 01/17/18; quite a bit of improvement since last time I saw this patient she has a small open area remaining on the left lateral calf and even smaller area medially. She has lymphedema  chronic stasis changes with distal skin fibrosis. She has an appointment with vascular surgery later this month 01/24/2018; the patient's medial leg has closed. Still a small open area on the left lateral leg. She states that the 4 layer compression we put on last week was too tight and she had to take it off over a few days ago. We have had resultant increase in her lymphedema in the dorsal foot and a proximal calf. Fortunately that does not seem to have resulted in any deterioration in her wounds The patient is going to need compression stockings. We have given her measurements to phone elastic therapy in [place]. She has vascular surgery consult on December 26 02/07/18; she's had continuous contraction on the left lateral leg wound which is now very small. Currently using silver alginate under 3 layer compression She saw Dr. Myra Gianotti of vascular surgery on 02/06/18. It was noted that she had a normal reflux times in the popliteal vein, great saphenous vein at the saphenofemoral junction, great saphenous vein at the proximal thigh great saphenous vein at the mid calf and origin of the small saphenous vein. It was noted that she had significant reflux in the left saphenous veins with diameter measurements in the 0.7-0.8 cm range. It was felt she would benefit from laser ablation to help minimize the risk of ulcer recurrence. It  was recommended that she wear 20-30 thigh-high compression stockings and have follow-up in 4-6 weeks 02/17/2018; I thought this lady would be healed however her compression slipped down and she developed increasing swelling and the wound is actually larger. 1/13; we had deterioration last week after the patient's compression slipped down and she developed periwound swelling. I increased her compression before [name], [name] ([id number]) [phone].pdf Page 8 of 13 layers we have been using silver alginate we are a lot better again today. She has her  compression stockings in waiting 1/23; the patient's wounds are totally healed today. She has her stockings. This was almost circumferential skin damage. She follows up with Dr. Myra Gianotti of vascular surgery next Monday. READMISSION 02/21/2021 This is a now 67 year old woman that we had in clinic here discharging in January 2020 with wounds on her left calf chronic venous insufficiency. She was discharged with 30/40 stockings. It does not sound like she has worn stockings in about a year largely from not being able to get them on herself. In November she developed new blisters on her legs an area laterally is opened into a fairly sizable wound. She has weeping posteriorly as well. She has been using Neosporin and Band-Aids. The patient did see Dr. Myra Gianotti in 2019 and 2020. He felt she might benefit from laser ablation in her left saphenous veins although because of the wound that healed I do not think he went through with it. She might benefit from seeing him again. Her ABI on the left is 1. 1/17; patient's wound on the posterior left calf is closed she has the 2 large areas last week. We put her in 4-layer compression for edema control is a lot better. Our intake nurse noted greenish drainage and odor. We have been using silver alginate 1/25; PCR culture I did have the substantial wound area on the left lateral lower leg showed staff aureus and group A strep. Low titers of coag negative staph which are probably skin contaminants. Resistance detected to tetracycline methicillin and macrolides. I gave her a starter kit of Nuzyra 150 mg x 3 for 2 days then 300 mg for a further 7 days. Marked odor considerable increase in surrounding erythema. We are using Iodoflex last week however I have changed to silver alginate with underlying Bactroban 2/1; she is completing her [place] tomorrow. The degree of erythema around the wounds looks a lot better. Odor has improved. I gave her silver alginate and Bactroban  last week and changing her back to Iodoflex to continue with ongoing debridement 2/8; the periwound looks a lot better. Surface of the wound also looks somewhat better although there is still ongoing debridement to be done we have been using Iodoflex under compression. Primary dressing and silver alginate 2/15; left posterior calf. Some improvement in the surface of the wound but still very gritty we have been using Iodoflex under compression. 04/05/2021: Left lateral posterior calf. She continues to have significant amounts of drainage, some of which is probably comprised of the Iodoflex microbleeds. There is no odor to the drainage. The satellite lesion on the more posterior aspect of the calf is epithelializing nicely and has contracted quite a bit. There is robust granulation tissue in the dominant wound with minimal adherent slough. 04/12/2021: The satellite lesion has nearly closed. She continues to have good granulation tissue with minimal slough of the larger primary wound. She continues to have a fair amount of drainage, but I think this is less secondary to switching her dressing  from Iodoflex to Prisma. 04/19/2021: The satellite lesion is almost completely epithelialized. The larger primary wound continues to contract with good granulation tissue and minimal slough. She is currently in [place] with compression. 04/26/2021: The satellite lesion has closed. The larger primary wound has contracted further and the granulation tissue is robust without being hypertrophic. Minimal slough is present. 05/03/2021: The satellite lesion remains closed. The larger primary wound has a bit of slough, but has also contracted further with good perimeter epithelialization. Good granulation tissue at the wound surface. There was some greenish drainage on the dressing when it was removed; it is not the blue-green typically associated with Pseudomonas aeruginosa, however. 05/10/2021: The primary wound continues to  contract. There is minimal slough. The granulation tissue at 9:00 is a bit hypertrophic. No significant drainage. No odor. 05/17/2021: The wound is a little bit smaller today with good granulation tissue. Minimal slough. No significant drainage or odor. 05/24/2021: The wound continues to contract and has a nice base of granulation tissue. Small amount of slough. No concern for infection. 05/31/2021: For some reason, the wound measured slightly larger today but overall it still appears to be in good condition with a nice base of granulation tissue and minimal slough. 06/07/2021: The wound is smaller today. Good granulation tissue and minimal slough. 06/14/2021: The wound is unchanged in size. She continues to accumulate some slough. Good granulation tissue on the surface. 06/20/2021: The wound is smaller today. Minimal slough with good granulation tissue. 06/27/2021: The wound on her left lateral leg is smaller today with just a bit of slough and eschar accumulation. Unfortunately, she has opened a new superficial wound on her right lower extremity. She has 3+ pitting edema to the knees on that leg and she does not wear compression stockings. 07/04/2021: In addition to the superficial wound on her right lower extremity that she opened up last week, she has opened 3 additional sites on the same leg. Her compression wraps were clearly not in correct position when she came to clinic today as they were at about the mid calf level rather than to the tibial tuberosity. She says that they slipped earlier and she had to come back on Friday to have them redone. The wound on her left lateral leg is perhaps slightly larger with some slough accumulation. 07/11/2021: The superficial wounds on her right lower extremity are nearly closed. They just have a thin layer of eschar overlying them. The wound on her left lateral leg is a little bit shallower and has a bit of slough accumulation. 07/17/2021: The right medial lower  extremity leg wounds have almost completely closed; one of them just has a tiny opening with a little bit of serous drainage. The left lateral leg wound is really unchanged. It seems to be stalled. 07/24/2021: The right leg wounds are completely closed. The left lateral leg wound is actually bigger today. It is a bit more tender. There is a minor accumulation of slough on the surface. 07/31/2021: The culture that I took last week was positive for MRSA. We added mupirocin under the silver alginate and I prescribed doxycycline. She has been tolerating this well. The wound is smaller today but does have slough accumulation. No significant pain or drainage. 08/07/2021: She completed her course of doxycycline. The wound looks much better today. It is smaller and the periwound is less inflamed. She does have some slough accumulation on the surface. 08/14/2021: The wound continues to contract. There is slough on the wound surface, but  the periwound is intact without inflammation or induration. 08/21/2021: The wound is down to just 2 small open sites. They both have a bit of slough accumulation. [name], [name] ([id number]) [id number] Page 9 of 13 08/28/2021: The wound is down to just 1 small open site with a little bit of slough and eschar accumulation. 09/04/2021: The wound continues to contract but remains open. It is clean without any slough. 09/12/2021: No real change in the overall wound dimensions, but it is flush with the surrounding skin. There is a little bit of slough accumulation. Edema control is good. 09/22/2021: The wound is smaller and even more superficial. Minimal slough accumulation. Good control of edema. 09/29/2021: The wound continues to contract. She reports that it was a little bit "twingey" during the week. It is a little bit dry on inspection today. Light accumulation of slough. Good edema control. 10/06/2021: The wound is about the same size today. There is a  little slough on the surface. Edema control is good. 10/13/2021: The wound is about the same size but more superficial. A little bit of slough on the surface. 10/23/2021: The wound is slightly smaller and continues to fill in. Minimal slough on the wound surface. 10/30/2021: The wound continues to contract but has not yet closed. Light slough and eschar present. 11/06/2021: The wound persists, but seems a little bit more epithelialized. There is still slough and eschar accumulation. 11/14/2021: The wound continues to contract but persists. Light eschar around the wound. 11/22/2021: The wound is down to just a pinhole opening. There is eschar overlying the surface. Edema control is excellent. 11/29/2021: Her wound is closed. READMISSION 01/22/2022 This is a patient to be discharged in October. She has severe chronic venous insufficiency at that time she had a wound on her left lower leg posteriorly also the right leg at some point. These closed. We discharged her in 30/40 mm stockings from elastic therapy which she has been wearing religiously. She tells [name] that she was traumatized by a dolly while shopping at [employer] about 2 to 3 weeks ago. She has been left with a left anterior lower leg wound. She comes in in another pair of stockings other than her 3040s. She does not have an arterial issue with her last ABI in the left at 1.2 12/18; this is a patient who has chronic venous insufficiency and recurrent venous insufficiency ulcers. She has a area on her left anterior lower leg and we readmitted her to the clinic last week. We use silver alginate and 4-layer compression. ABI was 1.2 She brought in her stocking which was a 20/30 below-knee stocking from elastic therapy. She may require a 30/40 mm equivalent stockings after this wound heals. 02/06/2022: The intake nurse reported an odor coming from the wound when she first unwrapped it but this abated after the leg was washed. There is thick layer of slough  on the surface. 02/13/2022: The wound is cleaner today. It measured larger, but on visual inspection, I think some crust of Iodoflex was included in the wound measurements; the actual wound itself looks about the same to slightly smaller. Edema control is good. 02/20/2022: The wound is cleaner again today. It is also measuring a little bit smaller, but there has been some moisture related periwound breakdown. Edema control is good. 02/27/2022: The intake nurse reported that the wound measures larger, but some of this ended up being crusted on Iodoflex. There is still some periwound moisture. Still with thick slough accumulation. Edema control is  good. 03/06/2022: The wound is cleaner and more superficial, but did measure larger because of the inclusion of a satellite area. Still with some slough accumulation but less so than on prior visits. 03/13/2022: The wound measured a little bit smaller today. There is slough and eschar accumulation, as per usual. 03/20/2022: Her wound is larger and deeper today. The entire surface appears nonviable. There is more periwound erythema and threatened tissue breakdown. 03/27/2022: The culture that I took last week was positive for MRSA. We had empirically applied topical mupirocin to her wound. Today the wound is smaller and cleaner and less painful. There is still a layer of slough on the surface. 04/03/2022: The wound was measured slightly larger today, but appears roughly the same to my eye. There is a layer of slough on the surface. Underneath, the tissue is quite fibrotic. 04/10/2022: The wound is slightly smaller today. There is slough and eschar accumulation. For some reason, her wrap got changed from 4 layers to 3 layers and her edema control is not as good, as a result. 04/17/2022: The return to true 4-layer compression has improved the wound considerably. It is much smaller and cleaner today. Edema control is excellent. 04/24/2022: Her wound continues to improve. It  is smaller with just a bit of slough on the surface. Edema control is excellent. 05/08/2022: Her wound is smaller again today. There is still slough accumulation on the surface. Edema control remains excellent. 05/15/2022: The wound is substantially smaller this week. There is slough on the surface with excellent edema control. 05/22/2022: Her wound is smaller again this week. Minimal slough on the surface. Good edema control. 05/29/2022: Once again, her wound is smaller. There is a bit of slough accumulation. Edema control remains excellent. 06/05/2022: Her wound is smaller again by about half. There is some slough and eschar buildup. Edema control is consistently excellent. 06/12/2022: The wound was initially measured larger by couple of millimeters, but upon debridement, much of this was found to be slough and eschar. The wound is actually about the same size. 06/18/2022: The wound is down to just a couple of millimeters, covered by eschar. 06/26/2022: Her wound is healed. 07/02/2022: Patient was seen today as part of the secondary prevention program. This is a 2-week follow-up visit. Her wound remains closed. She is wearing her [name], [name] ([id number]) [phone].pdf Page 10 of 13 compression stockings as advised. 08/31/2022: Returns with new wound, same site. Started as blister. Patient thinks compression stockings causing blisters. 09/10/2022: The small distal cluster of wounds has closed. She has a new wound proximal to the main wound. It is superficial and limited to breakdown of skin and appears secondary to friction. The main wound has a fibrotic surface with slough accumulation. Edema control is acceptable. 09/17/2022: The proximal wound has eschar on the surface. Underneath, there is a little bit of slough. The fibrotic surface of the main wound has not really improved, although there is some granulation tissue in the center. Edema control is good. 10/01/2022: The proximal  wound is almost completely closed with just a glisten under illumination suggesting that it is not completely healed. The more distal wound is smaller by about half a centimeter. There is slough accumulation on the surface and it is a little bit less fibrotic today. Patient History Information obtained from Patient. Family History Heart Disease - Mother,Father, Hypertension - Mother,Father, Kidney Disease - Mother, Thyroid Problems - Mother, No family history of Cancer, Diabetes, Hereditary Spherocytosis, Lung Disease, Seizures, Stroke, Tuberculosis. Social  History Never smoker, Marital Status - Single, Alcohol Use - Never, Drug Use - No History, Caffeine Use - Daily - coffee. Medical History Eyes Patient has history of Cataracts Hematologic/Lymphatic Patient has history of Lymphedema Cardiovascular Patient has history of Hypertension, Peripheral Venous Disease Integumentary (Skin) Denies history of History of Burn Musculoskeletal Patient has history of Osteoarthritis Hospitalization/Surgery History - colostomy reversal. 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 Medical A Surgical History Notes nd Constitutional Symptoms (General Health) morbid obesity Cardiovascular schamberg disease, hyperlipidemia Gastrointestinal diverticulitis , h/o obstruction due to diverticulitis , colostomy and colostomy reversal Endocrine hypothyroidism Objective Constitutional no acute distress. Vitals Time Taken: 9:54 AM, Height: 62 in, Weight: 221 lbs, BMI: 40.4, Temperature: 98.6 F, Pulse: 80 bpm, Respiratory Rate: 18 breaths/min, Blood Pressure: 126/74 mmHg. Respiratory Normal work of breathing on room air. General Notes: 10/01/2022: The proximal wound is almost completely closed with just a glisten under illumination suggesting that it is not completely healed. The more distal wound is smaller by about half a centimeter. There is slough accumulation on the surface and it is a little bit less fibrotic  today. Integumentary (Hair, Skin) Wound #8 status is Healed - Epithelialized. Original cause of wound was Gradually Appeared. The date acquired was: 07/14/2022. The wound has been in treatment 4 weeks. The wound is located on the Left,Proximal,Anterior Lower Leg. The wound measures 0cm length x 0cm width x 0cm depth; 0cm^2 area and 0cm^3 volume. There is no tunneling or undermining noted. There is a none present amount of drainage noted. There is no granulation within the wound bed. There is no necrotic tissue within the wound bed. The periwound skin appearance had no abnormalities noted for texture. The periwound skin appearance had no abnormalities noted for moisture. The periwound skin appearance had no abnormalities noted for color. Periwound temperature was noted as No Abnormality. Wound #9 status is Open. Original cause of wound was Gradually Appeared. The date acquired was: 07/14/2022. The wound has been in treatment 4 weeks. The wound is located on the [facility] Lower Leg. The wound measures 1.7cm length x 1.7cm width x 0.2cm depth; 2.27cm^2 area and 0.454cm^3 volume. There is Fat Layer (Subcutaneous Tissue) exposed. There is no tunneling or undermining noted. There is a medium amount of serosanguineous drainage noted. There is medium (34-66%) red granulation within the wound bed. There is a medium (34-66%) amount of necrotic tissue within the wound bed including Eschar and Adherent Slough. The periwound skin appearance had no abnormalities noted for moisture. The periwound skin appearance exhibited: Scarring, Hemosiderin Staining. Periwound temperature was noted as No Abnormality. 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 There was a Selective/Open Wound Non-Viable Tissue Debridement with a total area of 2.27 sq cm performed by [name], MD. With the following instrument(s): Curette to remove Non-Viable tissue/material. Material removed includes [facility] after achieving pain control using Lidocaine 4% Topical Solution. No specimens were taken. A time out was conducted at 10:11, prior to the start of the procedure. A Minimum amount of bleeding was controlled with Pressure. The procedure was tolerated well with a pain level of 0 throughout and a pain level of 0 following the procedure. Post Debridement Measurements: 1.7cm length x 1.7cm width x 0.2cm depth;  0.454cm^3 volume. Character of Wound/Ulcer Post Debridement is improved. Post procedure Diagnosis Wound #9: Same as Pre-Procedure General Notes: Scribed for Dr. Lady Gary by J.[name]. 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 There was a Four Layer Compression Therapy Procedure by Karie Schwalbe, RN. Post procedure Diagnosis Wound #9: Same as Pre-Procedure Plan Follow-up Appointments: Return Appointment in 1 week. - Dr. Lady Gary 10/08/22 at 9:30am Room 3 Return Appointment in 2 weeks. - Please ask front desk for appointments Discharge From [facility] Services: Discharge from Wound Care Center - Please keep wearing compression stockings. Anesthetic: (In clinic) Topical Lidocaine 4% applied to wound bed Bathing/ Shower/ Hygiene: May shower with protection but do not get wound dressing(s) wet. Protect dressing(s) with water repellant cover (for example, large plastic bag) or a cast cover and may then take shower. WOUND #9: - Lower Leg Wound Laterality: Left, Anterior, Distal Cleanser: Vashe 5.8 (oz) 1 x Per Day/30 Days Discharge Instructions: Cleanse the wound with Vashe prior to applying a clean dressing  using gauze sponges, not tissue or cotton balls. Topical: Mupirocin Ointment 1 x Per Day/30 Days Discharge Instructions: Apply Mupirocin (Bactroban) as instructed Prim Dressing: Maxorb Extra Ag+ Alginate Dressing, 4x4.75 (in/in) 1 x Per Day/30 Days ary Discharge Instructions: Apply to wound bed as instructed Secondary Dressing: ABD Pad, 8x10 1 x Per Day/30 Days Discharge Instructions: Apply over primary dressing as directed. Com pression Wrap: FourPress (4 layer compression wrap) 1 x Per Day/30 Days Discharge Instructions: Apply four layer compression as directed. May also use Urgo K2 compression system as alternative. 10/01/2022: The proximal wound is almost completely closed with just a glisten under illumination suggesting that it is not completely healed. The more distal wound is smaller by about half a centimeter. There is slough accumulation on the surface and it is a little bit less fibrotic today. I used a curette to debride slough from the distal wound. We will continue with mupirocin, silver alginate, and 4-layer compression/equivalent. I believe the proximal wound will be completely healed at her next visit. Follow up in 1 week's time. Electronic Signature(s) Signed: 10/03/2022 7:54:18 PM By: Shawn Stall RN, BSN Signed: 10/04/2022 8:01:11 AM By: [name] MD FACS Previous Signature: 10/01/2022 10:19:36 AM Version By: [name] MD FACS Previous Signature: 10/01/2022 10:19:00 AM Version By: [name] MD FACS Entered By: Shawn Stall on 10/03/2022 19:48:09 CHALA, KAZEMI D ([id number]) [id number] Page 12 of 13 -------------------------------------------------------------------------------- HxROS Details Patient Name: Date of Service: JASHONNA, GUICHARD 10/01/2022 9:15 A M Medical Record Number: [id number] Patient Account Number: [id number] Date of Birth/Sex: Treating RN: [birth year] (67 y.o. F) Primary Care Provider: [name]  Other Clinician: Referring Provider: Treating Provider/Extender: Jasmine Pang in Treatment: 4 Information Obtained From Patient Constitutional Symptoms (General Health) Medical History: Past Medical History Notes: morbid obesity Eyes Medical History: Positive for: Cataracts Hematologic/Lymphatic Medical History: Positive for: Lymphedema Cardiovascular Medical History: Positive for: Hypertension; Peripheral Venous Disease Past Medical History Notes: schamberg disease, hyperlipidemia Gastrointestinal Medical History: Past Medical History Notes: diverticulitis , h/o obstruction due to diverticulitis , colostomy and colostomy reversal Endocrine Medical History: Past Medical History Notes: hypothyroidism Integumentary (Skin) Medical History: Negative for: History of Burn Musculoskeletal Medical History: Positive for: Osteoarthritis HBO Extended History Items Eyes: Cataracts Immunizations Pneumococcal Vaccine: Received Pneumococcal Vaccination: Yes Received Pneumococcal Vaccination On or After 60th Birthday: Yes Implantable Devices None Hospitalization / Surgery History Type of Hospitalization/Surgery colostomy reversal 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 Family and Social  History SHARESSE, GROUT ([id number]) [id number] Page 13 of 13 Cancer: No; Diabetes: No; Heart Disease: Yes - Mother,Father; Hereditary Spherocytosis: No; Hypertension: Yes - Mother,Father; Kidney Disease: Yes - Mother; Lung Disease: No; Seizures: No; Stroke: No; Thyroid Problems: Yes - Mother; Tuberculosis: No; Never smoker; Marital Status - Single; Alcohol Use: Never; Drug Use: No History; Caffeine Use: Daily - coffee; Financial Concerns: No; Food, Clothing or Shelter Needs: No; Support System Lacking: No; Transportation Concerns: No Electronic Signature(s) Signed: 10/01/2022 11:17:29 AM By: [name] MD FACS Entered By: [name] on  10/01/2022 10:17:00 -------------------------------------------------------------------------------- SuperBill Details Patient Name: Date of Service: Erin Rosier [name] 10/01/2022 Medical Record Number: [id number] Patient Account Number: [id number] Date of Birth/Sex: Treating RN: 1955/04/11 (67 y.o. 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 with other specif Modifier: ied severity Quantity: 1 Physician Procedures : CPT4 Code Description Modifier [id number] 99214 - WC PHYS LEVEL 4 - EST PT 25 ICD-10 Diagnosis Description L97.928 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

## 2022-10-01 NOTE — Patient Instructions (Signed)
Hypertension, Adult Hypertension is another name for high blood pressure. High blood pressure forces your heart to work harder to pump blood. This can cause problems over time. There are two numbers in a blood pressure reading. There is a top number (systolic) over a bottom number (diastolic). It is best to have a blood pressure that is below 120/80. What are the causes? The cause of this condition is not known. Some other conditions can lead to high blood pressure. What increases the risk? Some lifestyle factors can make you more likely to develop high blood pressure: Smoking. Not getting enough exercise or physical activity. Being overweight. Having too much fat, sugar, calories, or salt (sodium) in your diet. Drinking too much alcohol. Other risk factors include: Having any of these conditions: Heart disease. Diabetes. High cholesterol. Kidney disease. Obstructive sleep apnea. Having a family history of high blood pressure and high cholesterol. Age. The risk increases with age. Stress. What are the signs or symptoms? High blood pressure may not cause symptoms. Very high blood pressure (hypertensive crisis) may cause: Headache. Fast or uneven heartbeats (palpitations). Shortness of breath. Nosebleed. Vomiting or feeling like you may vomit (nauseous). Changes in how you see. Very bad chest pain. Feeling dizzy. Seizures. How is this treated? This condition is treated by making healthy lifestyle changes, such as: Eating healthy foods. Exercising more. Drinking less alcohol. Your doctor may prescribe medicine if lifestyle changes do not help enough and if: Your top number is above 130. Your bottom number is above 80. Your personal target blood pressure may vary. Follow these instructions at home: Eating and drinking  If told, follow the DASH eating plan. To follow this plan: Fill one half of your plate at each meal with fruits and vegetables. Fill one fourth of your plate  at each meal with whole grains. Whole grains include whole-wheat pasta, brown rice, and whole-grain bread. Eat or drink low-fat dairy products, such as skim milk or low-fat yogurt. Fill one fourth of your plate at each meal with low-fat (lean) proteins. Low-fat proteins include fish, chicken without skin, eggs, beans, and tofu. Avoid fatty meat, cured and processed meat, or chicken with skin. Avoid pre-made or processed food. Limit the amount of salt in your diet to less than 1,500 mg each day. Do not drink alcohol if: Your doctor tells you not to drink. You are pregnant, may be pregnant, or are planning to become pregnant. If you drink alcohol: Limit how much you have to: 0-1 drink a day for women. 0-2 drinks a day for men. Know how much alcohol is in your drink. In the [place] one drink equals one 12 oz bottle of beer (355 mL), one 5 oz glass of wine (148 mL), or one 1 oz glass of hard liquor (44 mL). Lifestyle  Work with your doctor to stay at a healthy weight or to lose weight. Ask your doctor what the best weight is for you. Get at least 30 minutes of exercise that causes your heart to beat faster (aerobic exercise) most days of the week. This may include walking, swimming, or biking. Get at least 30 minutes of exercise that strengthens your muscles (resistance exercise) at least 3 days a week. This may include lifting weights or doing Pilates. Do not smoke or use any products that contain nicotine or tobacco. If you need help quitting, ask your doctor. Check your blood pressure at home as told by your doctor. Keep all follow-up visits. Medicines Take over-the-counter and prescription medicines   only as told by your doctor. Follow directions carefully. Do not skip doses of blood pressure medicine. The medicine does not work as well if you skip doses. Skipping doses also puts you at risk for problems. Ask your doctor about side effects or reactions to medicines that you should watch  for. Contact a doctor if: You think you are having a reaction to the medicine you are taking. You have headaches that keep coming back. You feel dizzy. You have swelling in your ankles. You have trouble with your vision. Get help right away if: You get a very bad headache. You start to feel mixed up (confused). You feel weak or numb. You feel faint. You have very bad pain in your: Chest. Belly (abdomen). You vomit more than once. You have trouble breathing. These symptoms may be an emergency. Get help right away. Call 911. Do not wait to see if the symptoms will go away. Do not drive yourself to the hospital. Summary Hypertension is another name for high blood pressure. High blood pressure forces your heart to work harder to pump blood. For most people, a normal blood pressure is less than 120/80. Making healthy choices can help lower blood pressure. If your blood pressure does not get lower with healthy choices, you may need to take medicine. This information is not intended to replace advice given to you by your health care provider. Make sure you discuss any questions you have with your health care provider. Document Revised: 11/17/2020 Document Reviewed: 11/17/2020 [employer]vier Patient Education  2024 [employer]  

## 2022-10-02 LAB — BMP8+EGFR
BUN/Creatinine Ratio: 16 (ref 12–28)
BUN: 17 mg/dL (ref 8–27)
CO2: 23 mmol/L (ref 20–29)
Calcium: 9.6 mg/dL (ref 8.7–10.3)
Chloride: 105 mmol/L (ref 96–106)
Creatinine, Ser: 1.07 mg/dL — ABNORMAL HIGH (ref 0.57–1.00)
Glucose: 99 mg/dL (ref 70–99)
Potassium: 4.7 mmol/L (ref 3.5–5.2)
Sodium: 142 mmol/L (ref 134–144)
eGFR: 56 mL/min/{1.73_m2} — ABNORMAL LOW (ref >59)

## 2022-10-02 LAB — CBC WITH DIFFERENTIAL/PLATELET
Basophils Absolute: 0 10*3/uL (ref 0.0–0.2)
Basos: 1 %
EOS (ABSOLUTE): 0.1 10*3/uL (ref 0.0–0.4)
Eos: 1 %
Hematocrit: 35.4 % (ref 34.0–46.6)
Hemoglobin: 12 g/dL (ref 11.1–15.9)
Immature Grans (Abs): 0 10*3/uL (ref 0.0–0.1)
Immature Granulocytes: 0 %
Lymphocytes Absolute: 1.9 10*3/uL (ref 0.7–3.1)
Lymphs: 25 %
MCH: 29.2 pg (ref 26.6–33.0)
MCHC: 33.9 g/dL (ref 31.5–35.7)
MCV: 86 fL (ref 79–97)
Monocytes Absolute: 0.5 10*3/uL (ref 0.1–0.9)
Monocytes: 7 %
Neutrophils Absolute: 5 10*3/uL (ref 1.4–7.0)
Neutrophils: 66 %
Platelets: 266 10*3/uL (ref 150–450)
RBC: 4.11 x10E6/uL (ref 3.77–5.28)
RDW: 12.8 % (ref 11.7–15.4)
WBC: 7.5 10*3/uL (ref 3.4–10.8)

## 2022-10-02 LAB — HEMOGLOBIN A1C
Est. average glucose Bld gHb Est-mCnc: 123 mg/dL
Hgb A1c MFr Bld: 5.9 % — ABNORMAL HIGH (ref 4.8–5.6)

## 2022-10-02 LAB — LIPID PANEL
Chol/HDL Ratio: 2.3 ratio (ref 0.0–4.4)
Cholesterol, Total: 162 mg/dL (ref 100–199)
HDL: 70 mg/dL (ref >39)
LDL Chol Calc (NIH): 70 mg/dL (ref 0–99)
Triglycerides: 125 mg/dL (ref 0–149)
VLDL Cholesterol Cal: 22 mg/dL (ref 5–40)

## 2022-10-08 ENCOUNTER — Encounter (HOSPITAL_BASED_OUTPATIENT_CLINIC_OR_DEPARTMENT_OTHER): Payer: Commercial Managed Care - PPO | Admitting: General Surgery

## 2022-10-08 ENCOUNTER — Encounter: Payer: Self-pay | Admitting: Internal Medicine

## 2022-10-08 DIAGNOSIS — L958 Other vasculitis limited to the skin: Secondary | ICD-10-CM | POA: Diagnosis not present

## 2022-10-08 DIAGNOSIS — L02211 Cutaneous abscess of abdominal wall: Secondary | ICD-10-CM | POA: Diagnosis not present

## 2022-10-08 DIAGNOSIS — L97928 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: Secondary | ICD-10-CM | POA: Diagnosis not present

## 2022-10-08 NOTE — Progress Notes (Signed)
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 surface appears nonviable. There is more periwound erythema and threatened tissue breakdown. 03/27/2022: The culture that I took last week was  positive for MRSA. We had empirically applied topical mupirocin to her wound. Today the wound is smaller and cleaner and less painful. There is still a layer of slough on the surface. 04/03/2022: The wound was measured slightly larger today, but appears roughly the same to my eye. There is a layer of slough on the surface. Underneath, the tissue is quite fibrotic. 04/10/2022: The wound is slightly smaller today. There is slough and eschar accumulation. For some reason, her wrap got changed from 4 layers to 3 layers and her edema control is not as good, as a result. 04/17/2022: The return to true 4-layer compression has improved the wound considerably. It is much smaller  and cleaner today. Edema control is excellent. 04/24/2022: Her wound continues to improve. It is smaller with just a bit of slough on the surface. Edema control is excellent. 05/08/2022: Her wound is smaller again today. There is still slough accumulation on the surface. Edema control remains excellent. 05/15/2022: The wound is substantially smaller this week. There is slough on the surface with excellent edema control. 05/22/2022: Her wound is smaller again this week. Minimal slough on the surface. Good edema control. 05/29/2022: Once again, her wound is smaller. There is a bit of slough accumulation. Edema control remains excellent. 06/05/2022: Her wound is smaller again by about half. There is some slough and eschar buildup. Edema control is consistently excellent. 06/12/2022: The wound was initially measured larger by couple of millimeters, but upon debridement, much of this was found to be slough and eschar. The wound is actually about the same size. 06/18/2022: The wound is down to just a couple of millimeters, covered by eschar. 06/26/2022: Her wound is healed. 07/02/2022: Patient was seen today as part of the secondary prevention program. This is a 2-week follow-up visit. Her wound remains closed. She is wearing her compression stockings as  advised. 08/31/2022: Returns with new wound, same site. Started as blister. Patient thinks compression stockings causing blisters. 09/10/2022: The small distal cluster of wounds has closed. She has a new wound proximal to the main wound. It is superficial and limited to breakdown of skin and appears secondary to friction. The main wound has a fibrotic surface with slough accumulation. Edema control is acceptable. 09/17/2022: The proximal wound has eschar on the surface. Underneath, there is a little bit of slough. The fibrotic surface of the main wound has not really improved, although there is some granulation tissue in the center. Edema control is good. 10/01/2022: The proximal wound is almost completely closed with just a glisten under illumination suggesting that it is not completely healed. The more distal wound is smaller by about half a centimeter. There is slough accumulation on the surface and it is a little bit less fibrotic today. 10/08/2022: The proximal wound is healed. The more distal wound is smaller. There is some slough accumulation and a little bit of eschar around the edges. Today, the patient pointed out a wound on her left lower abdomen that has been present since a colostomy reversal in 2018. She has not ever discussed this with the surgeon that performed her ostomy takedown and this is the first time she has mentioned it to me. The tissue was very friable and apparently her PCP just started her on Augmentin, although I do not see any obvious signs of infection. [name], [name] ([id number]) [id number] Page 11 of 15 Patient History Information obtained from Patient. Family History Heart Disease - Mother,Father, Hypertension - Mother,Father, Kidney Disease - Mother, Thyroid Problems - Mother, No family history of Cancer, Diabetes, Hereditary Spherocytosis, Lung Disease, Seizures, Stroke, Tuberculosis. Social History Never smoker, Marital Status - Single,  Alcohol Use - Never, Drug Use - No History, Caffeine Use - Daily - coffee. Medical History Eyes Patient has history of Cataracts Hematologic/Lymphatic Patient has history of Lymphedema Cardiovascular Patient has history of Hypertension, Peripheral Venous Disease Integumentary (Skin) Denies history of History of Burn Musculoskeletal Patient has history of Osteoarthritis Hospitalization/Surgery History - colostomy reversal. 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 Vitals Time Taken: 9:59 AM, Height: 62 in, Weight: 221 lbs, BMI: 40.4, Temperature: 97.6 F, Pulse: 99 bpm, Respiratory Rate: 18 breaths/min, Blood Pressure: 131/85 mmHg. Respiratory Normal work of breathing on room air. General Notes: 10/08/2022: The proximal wound is healed. The more distal wound is smaller. There is some slough accumulation and a little bit of eschar around the edges. Today, the patient pointed out a wound on her left lower abdomen that has been present since a colostomy reversal in 2018. The tissue was very friable. Integumentary (Hair, Skin) Wound #10 status is Open. Original cause of wound was Surgical Injury. The date acquired was: 02/06/2017. The wound is located on the Left Abdomen - Lower Quadrant. The wound measures 2.5cm length x 1.3cm width x 0.1cm depth; 2.553cm^2 area and 0.255cm^3 volume. There is Fat Layer (Subcutaneous Tissue) exposed. There is no tunneling or undermining noted. There is a medium amount of serosanguineous drainage noted. There is small (1-33%) red granulation within the wound bed. There is a large (67-100%) amount of necrotic tissue within the wound bed including  Adherent Slough. The periwound skin appearance had no abnormalities noted for moisture. The periwound skin appearance had no abnormalities noted for color. The periwound skin appearance exhibited: Scarring. Periwound temperature was noted as No Abnormality. Wound #9 status is Open. Original cause of wound was Gradually Appeared. The date acquired was: 07/14/2022. The wound has been in treatment 5 weeks. The wound is located on the [facility] Lower Leg. The wound measures 1.5cm length x 1.6cm width x 0.2cm depth; 1.885cm^2 area and 0.377cm^3 volume. There is Fat Layer (Subcutaneous Tissue) exposed. There is no tunneling or undermining noted. There is a medium amount of serosanguineous drainage noted. There is small (1-33%) red granulation within the wound bed. There is a large (67-100%) amount of necrotic tissue within the wound bed including Eschar and Adherent Slough. The periwound skin appearance had no abnormalities noted for moisture. The periwound skin appearance exhibited: Scarring, Hemosiderin Staining. Periwound temperature was noted as No Abnormality. 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 There was a Selective/Open Wound Non-Viable Tissue Debridement with a total area of 1.88 sq cm performed by [name], MD. With the following instrument(s): Curette to remove Non-Viable tissue/material. Material removed includes [facility] after achieving pain control using Lidocaine 4% T opical Solution. No specimens were taken.A Minimum amount of  bleeding was controlled with Pressure. The procedure was tolerated well with a pain level of 0 throughout and a pain level of 0 following the procedure. Post Debridement Measurements: 1.5cm length x 1.6cm width x 0.2cm depth; 0.377cm^3 volume. Character of Wound/Ulcer Post Debridement is improved. Post procedure Diagnosis Wound #9: Same as Pre-Procedure General Notes: Scribed for Dr. Lady Gary by J.[name]. 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 There was a Four Layer Compression Therapy Procedure by Karie Schwalbe, RN. Post procedure Diagnosis Wound #9: Same as Pre-Procedure Wound #10 Pre-procedure diagnosis of Wound #10 is an Abscess located on the Left Abdomen - Lower Quadrant . An Chemical Cauterization procedure was performed by [name], MD. Post procedure Diagnosis Wound #10:  Same as Pre-Procedure Plan Follow-up Appointments: Return Appointment in 1 week. - Dr. Lady Gary Room 3 Return Appointment in 2 weeks. - Please ask front desk for appointments Discharge From [facility] Services: Discharge from Wound Care Center - Please keep wearing compression stockings. Anesthetic: (In clinic) Topical Lidocaine 4% applied to wound bed Bathing/ Shower/ Hygiene: May shower with protection but do not get wound dressing(s) wet. Protect dressing(s) with water repellant cover (for example, large plastic bag) or a cast cover and may then take shower. WOUND #10: - Abdomen - Lower Quadrant Wound Laterality: Left Cleanser: Soap and Water 1 x Per Day/30 Days Discharge Instructions: May shower and wash wound with dial antibacterial soap and water prior to dressing change. Cleanser: Vashe 5.8 (oz) 1 x Per Day/30 Days Discharge Instructions: Cleanse the wound with Vashe prior to applying a clean dressing using gauze sponges, not tissue or cotton balls. Cleanser: Wound Cleanser 1 x Per Day/30 Days Discharge Instructions: Cleanse the wound with wound  cleanser prior to applying a clean dressing using gauze sponges, not tissue or cotton balls. Prim Dressing: Maxorb Extra Ag+ Alginate Dressing, 2x2 (in/in) 1 x Per Day/30 Days ary Discharge Instructions: Apply to wound bed as instructed Secondary Dressing: ABD Pad, 8x10 1 x Per Day/30 Days Discharge Instructions: Apply over primary dressing as directed. Secured With: 71M Medipore Scientist, research (life sciences) Surgical T 2x10 (in/yd) 1 x Per Day/30 Days ape Discharge Instructions: Secure with tape as directed. WOUND #9: - Lower Leg Wound Laterality: Left, Anterior, Distal Cleanser: Vashe 5.8 (oz) 1 x Per Week/30 Days Discharge Instructions: Cleanse the wound with Vashe prior to applying a clean dressing using gauze sponges, not tissue or cotton balls. Topical: Gentamicin 1 x Per Week/30 Days Discharge Instructions: As directed by physician Topical: Mupirocin Ointment 1 x Per Week/30 Days Discharge Instructions: Apply Mupirocin (Bactroban) as instructed Prim Dressing: Maxorb Extra Ag+ Alginate Dressing, 4x4.75 (in/in) 1 x Per Week/30 Days ary Discharge Instructions: Apply to wound bed as instructed Secondary Dressing: ABD Pad, 8x10 1 x Per Week/30 Days Discharge Instructions: Apply over primary dressing as directed. Com pression Wrap: FourPress (4 layer compression wrap) 1 x Per Week/30 Days Discharge Instructions: Apply four layer compression as directed. May also use Urgo K2 compression system as alternative. 10/08/2022: The proximal wound is healed. The more distal wound is smaller. There is some slough accumulation and a little bit of eschar around the edges. Today, the patient pointed out a wound on her left lower abdomen that has been present since a colostomy reversal in 2018. She has not ever discussed this with the surgeon that performed her ostomy takedown and this is the first time she has mentioned it to me. The tissue was very friable and apparently her PCP just started her on Augmentin, although I  do not see any obvious signs of infection. I used a curette to debride slough and eschar from her leg wound. We will continue the mixture of topical gentamicin and mupirocin with silver alginate and 4- layer compression/equivalent. I chemically cauterized the friable tissue on the patient's abdominal wound with silver nitrate. We will apply silver alginate and a dry dressing here. I am not sure why she has never mentioned this to me nor why she never went back to the surgeon who performed her ostomy reversal. I have encouraged her to reach out to Dr. Romie Levee at [facility] as the site may require reexcision. Follow-up with me in 1 week. Electronic Signature(s) Signed: 10/09/2022 8:09:21 AM By:  [name] MD FACS Previous Signature: 10/08/2022 10:39:50 AM Version By: [name] MD FACS Entered By: [name] on 10/09/2022 08:09:21 DANDRE, BARINGER [name] ([id number]) [id number] Page 13 of 15 -------------------------------------------------------------------------------- HxROS Details Patient Name: Date of Service: YONNA, DIPPOLD 10/08/2022 9:30 A M Medical Record Number: [id number] Patient Account Number: [id number] Date of Birth/Sex: Treating RN: 13-Feb-1955 (67 y.o. F) Primary Care Provider: [name] Other Clinician: Referring Provider: Treating Provider/Extender: Jasmine Pang in Treatment: 5 Information Obtained From Patient Constitutional Symptoms (General Health) Medical History: Past Medical History Notes: morbid obesity Eyes Medical History: Positive for: Cataracts Hematologic/Lymphatic Medical History: Positive for: Lymphedema Cardiovascular Medical History: Positive for: Hypertension; Peripheral Venous Disease Past Medical History Notes: schamberg disease, hyperlipidemia Gastrointestinal Medical History: Past Medical History Notes: diverticulitis , h/o obstruction due to  diverticulitis , colostomy and colostomy reversal Endocrine Medical History: Past Medical History Notes: hypothyroidism Integumentary (Skin) Medical History: Negative for: History of Burn Musculoskeletal Medical History: Positive for: Osteoarthritis HBO Extended History Items Eyes: Cataracts Immunizations Pneumococcal Vaccine: Received Pneumococcal Vaccination: Yes Received Pneumococcal Vaccination On or After 60th Birthday: Yes Implantable Devices None DULSE, BOCH [name] ([id number]) [phone].pdf Page 14 of 15 Hospitalization / Surgery History Type of Hospitalization/Surgery colostomy reversal 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 Family and Social History Cancer: No; Diabetes: No; Heart Disease: Yes - Mother,Father; Hereditary Spherocytosis: No; Hypertension: Yes - Mother,Father; Kidney Disease: Yes - Mother; Lung Disease: No; Seizures: No; Stroke: No; Thyroid Problems: Yes - Mother; Tuberculosis: No; Never smoker; Marital Status - Single; Alcohol Use: Never; Drug Use: No History; Caffeine Use: Daily - coffee; Financial Concerns: No; Food, Clothing or Shelter Needs: No; Support System Lacking: No; Transportation Concerns: No Electronic Signature(s) Signed: 10/08/2022 11:49:36 AM By: [name] MD FACS Entered By: [name] on 10/08/2022 10:36:02 -------------------------------------------------------------------------------- SuperBill Details Patient Name: Date of Service: [name] 10/08/2022 Medical Record Number: [id number] Patient Account Number: [id number] Date of Birth/Sex: Treating RN: [birth year] (66 y.o. 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 with other specif Modifier: ied severity Quantity: 1 : CPT4 Code: 10626948 Description: 17250 - CHEM CAUT GRANULATION TISS ICD-10 Diagnosis Description L98.499 Non-pressure chronic ulcer of skin of other sites with unspecified severity Modifier: Quantity: 1 Physician Procedures : CPT4 Code Description Modifier [id number] 99214 - WC PHYS LEVEL 4 - EST PT 25 ICD-10 Diagnosis Description L97.928 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

## 2022-10-08 NOTE — Progress Notes (Signed)
KACY, PROVO D ([id number]) [id number] Page 1 of 9 Visit Report for 10/08/2022 Arrival Information Details Patient Name: Date of Service: [name] 10/08/2022 9:30 A M Medical Record Number: [id number] Patient Account Number: [id number] Date of Birth/Sex: Treating RN: 09-25-1955 (67 y.o. F) Primary Care Shadd Dunstan: [name] Other Clinician: Referring Ranada Vigorito: Treating Veasna Santibanez/Extender: Jasmine Pang in Treatment: 5 Visit Information History Since Last Visit All ordered tests and consults were completed: No Patient Arrived: Dan Humphreys Added or deleted any medications: No Arrival Time: 09:58 Any new allergies or adverse reactions: No Accompanied By: self Had a fall or experienced change in No Transfer Assistance: None activities of daily living that may affect Patient Identification Verified: Yes risk of falls: Secondary Verification Process Completed: Yes Signs or symptoms of abuse/neglect since last visito No Patient Requires Transmission-Based Precautions: No Hospitalized since last visit: No Patient Has Alerts: No Implantable device outside of the clinic excluding No cellular tissue based products placed in the center since last visit: Pain Present Now: No Electronic Signature(s) Signed: 10/08/2022 1:56:44 PM By: Dayton Scrape Entered By: Dayton Scrape on 10/08/2022 06:59:10 -------------------------------------------------------------------------------- Compression Therapy Details Patient Name: Date of Service: [name] 10/08/2022 9:30 A M Medical Record Number: [id number] Patient Account Number: [id number] Date of Birth/Sex: Treating RN: 04/05/1955 (67 y.o. [name] Primary Care Jalisia Puchalski: [name] Other Clinician: Referring Jadelin Eng: Treating Kalel Harty/Extender: Jasmine Pang in Treatment: 5 Compression Therapy Performed for Wound Assessment: Wound #9  Left,Distal,Anterior Lower Leg Performed By: Clinician Karie Schwalbe, RN Compression Type: Four Layer Post Procedure Diagnosis Same as Pre-procedure Electronic Signature(s) Signed: 10/08/2022 3:46:49 PM By: Karie Schwalbe RN Entered By: Karie Schwalbe on 10/08/2022 07:31:34 NAIYELI, ROTELLA D ([id number]) [id number] Page 2 of 9 -------------------------------------------------------------------------------- Encounter Discharge Information Details Patient Name: Date of Service: [name], [name] 10/08/2022 9:30 A M Medical Record Number: [id number] Patient Account Number: [id number] Date of Birth/Sex: Treating RN: [birth year] (67 y.o. [name] Primary Care Taniesha Glanz: [name] Other Clinician: Referring Ashur Glatfelter: Treating Harvir Patry/Extender: Jasmine Pang in Treatment: 5 Encounter Discharge Information Items Post Procedure Vitals Discharge Condition: Stable Temperature (F): 97.6 Ambulatory Status: Walker Pulse (bpm): 99 Discharge Destination: Home Respiratory Rate (breaths/min): 18 Transportation: Private Auto Blood Pressure (mmHg): 131/65 Accompanied By: self Schedule Follow-up Appointment: Yes Clinical Summary of Care: Patient Declined Electronic Signature(s) Signed: 10/08/2022 3:46:49 PM By: Karie Schwalbe RN Entered By: Karie Schwalbe on 10/08/2022 12:22:04 -------------------------------------------------------------------------------- Lower Extremity Assessment Details Patient Name: Date of Service: [name] D. 10/08/2022 9:30 A M Medical Record Number: [id number] Patient Account Number: [id number] Date of Birth/Sex: Treating RN: 1955/10/14 (66 y.o. [name] Primary Care Kayanna Mckillop: [name] Other Clinician: Referring Lihanna Biever: Treating Walt Geathers/Extender: Jasmine Pang in Treatment: 5 Edema Assessment Assessed: [Left: No] [Right: No] [Left: Edema] [Right:  :] Calf Left: Right: Point of Measurement: 34 cm From Medial Instep 38.2 cm 45.5 cm Ankle Left: Right: Point of Measurement: 9 cm From Medial Instep 22 cm 23 cm Vascular Assessment Extremity colors, hair growth, and conditions: Hair Growth on Extremity: [Left:No] [Right:No] Temperature of Extremity: [Left:Warm] [Right:Warm] Capillary Refill: [Left:< 3 seconds] [Right:< 3 seconds] Dependent Rubor: [Left:No No] [Right:No No] Toe Nail Assessment Left: Right: Thick: Yes Discolored: No Deformed: Yes Improper Length and Hygiene: No Electronic Signature(s) Signed: 10/08/2022 3:46:49 PM By: Karie Schwalbe RN Entered By: Karie Schwalbe on 10/08/2022 07:07:20 Tiburcio Bash D ([id number]) [id number] Page 3 of 9 -------------------------------------------------------------------------------- Multi Wound Chart Details Patient  Name: Date of Service: [name] 10/08/2022 9:30 A M Medical Record Number: [id number] Patient Account Number: [id number] Date of Birth/Sex: Treating RN: 1955-03-04 (67 y.o. F) Primary Care Starsky Nanna: [name] Other Clinician: Referring Kamarie Veno: Treating King Pinzon/Extender: Jasmine Pang in Treatment: 5 Vital Signs Height(in): 62 Pulse(bpm): 99 Weight(lbs): 221 Blood Pressure(mmHg): 131/85 Body Mass Index(BMI): 40.4 Temperature(F): 97.6 Respiratory Rate(breaths/min): 18 [10:Photos:] [N/A:N/A] Left Abdomen - Lower Quadrant Left, Distal, Anterior Lower Leg N/A Wound Location: Surgical Injury Gradually Appeared N/A Wounding Event: Abscess Vasculitis N/A Primary Etiology: Cataracts, Lymphedema, Cataracts, Lymphedema, N/A Comorbid History: Hypertension, Peripheral Venous Hypertension, Peripheral Venous Disease, Osteoarthritis Disease, Osteoarthritis 02/06/2017 07/14/2022 N/A Date A cquired: 0 5 N/A Weeks of Treatment: Open Open N/A Wound Status: No No N/A Wound Recurrence: 2.5x1.3x0.1  1.5x1.6x0.2 N/A Measurements L x W x D (cm) 2.553 1.885 N/A A (cm) : rea 0.255 0.377 N/A Volume (cm) : N/A 80.00% N/A % Reduction in A rea: N/A 60.00% N/A % Reduction in Volume: Full Thickness Without Exposed Full Thickness Without Exposed N/A Classification: Support Structures Support Structures Medium Medium N/A Exudate A mount: Serosanguineous Serosanguineous N/A Exudate Type: red, brown red, brown N/A Exudate Color: Small (1-33%) Small (1-33%) N/A Granulation A mount: Red Red N/A Granulation Quality: Large (67-100%) Large (67-100%) N/A Necrotic A mount: Adherent Slough Eschar, Adherent Slough N/A Necrotic Tissue: Fat Layer (Subcutaneous Tissue): Yes Fat Layer (Subcutaneous Tissue): Yes N/A Exposed Structures: Fascia: No Fascia: No Tendon: No Tendon: No Muscle: No Muscle: No Joint: No Joint: No Bone: No Bone: No Small (1-33%) Small (1-33%) N/A Epithelialization: Debridement - Selective/Open Wound Debridement - Selective/Open Wound N/A Debridement: N/A Lidocaine 4% T opical Solution N/A Pain Control: [facility] N/A Tissue Debrided: Non-Viable Tissue Non-Viable Tissue N/A Level: 2.55 1.88 N/A Debridement A (sq cm): rea Curette Curette N/A Instrument: Moderate Minimum N/A Bleeding: Silver Nitrate Pressure N/A Hemostasis A chieved: 0 0 N/A Procedural Pain: 0 0 N/A Post Procedural Pain: Debridement Treatment Response: Procedure was tolerated well Procedure was tolerated well N/A Post Debridement Measurements L x 2.5x1.3x0.1 1.5x1.6x0.2 N/A W x D (cm) 0.255 0.377 N/A Post Debridement Volume: (cm) AMELLE, GUIRE D ([id number]) [id number] Page 4 of 9 Scarring: Yes Scarring: Yes N/A Periwound Skin Texture: No Abnormalities Noted No Abnormalities Noted N/A Periwound Skin Moisture: No Abnormalities Noted Hemosiderin Staining: Yes N/A Periwound Skin Color: No Abnormality No Abnormality N/A Temperature: Debridement  Compression Therapy N/A Procedures Performed: Debridement Treatment Notes Electronic Signature(s) Signed: 10/08/2022 10:33:25 AM By: [name] MD FACS Entered By: [name] on 10/08/2022 07:33:24 -------------------------------------------------------------------------------- Multi-Disciplinary Care Plan Details Patient Name: Date of Service: [name] 10/08/2022 9:30 A M Medical Record Number: [id number] Patient Account Number: [id number] Date of Birth/Sex: Treating RN: [birth year] (67 y.o. [name] Primary Care Chaniqua Brisby: [name] Other Clinician: Referring Melynda Krzywicki: Treating Racquel Arkin/Extender: Jasmine Pang in Treatment: 5 Active Inactive Wound/Skin Impairment Nursing Diagnoses: Impaired tissue integrity Goals: Patient/caregiver will verbalize understanding of skin care regimen Date Initiated: 09/03/2022 Target Resolution Date: 12/12/2022 Goal Status: Active Interventions: Assess patient/caregiver ability to obtain necessary supplies Assess patient/caregiver ability to perform ulcer/skin care regimen upon admission and as needed Assess ulceration(s) every visit Provide education on ulcer and skin care Screen for HBO Treatment Activities: Skin care regimen initiated : 08/31/2022 Topical wound management initiated : 08/31/2022 Notes: Electronic Signature(s) Signed: 10/08/2022 3:46:49 PM By: Karie Schwalbe RN Entered By: Karie Schwalbe on 10/08/2022 12:20:32 -------------------------------------------------------------------------------- Pain Assessment Details Patient Name: Date of Service: [name],  BO [name] 10/08/2022 9:30 A M Medical Record Number: [id number] Patient Account Number: [id number] Date of Birth/Sex: Treating RN: 17-Jul-1955 (67 y.o. Shir Amerson, Leggett D ([id number]) [id number] Page 5 of 9 Primary Care Iara Monds: [name] Other Clinician: Referring Kiyaan Haq: Treating  Mikhaila Roh/Extender: Jasmine Pang in Treatment: 5 Active Problems Location of Pain Severity and Description of Pain Patient Has Paino No Site Locations Pain Management and Medication Current Pain Management: Electronic Signature(s) Signed: 10/08/2022 1:56:44 PM By: Dayton Scrape Entered By: Dayton Scrape on 10/08/2022 06:59:39 -------------------------------------------------------------------------------- Patient/Caregiver Education Details Patient Name: Date of Service: [name] 8/26/2024andnbsp9:30 A M Medical Record Number: [id number] Patient Account Number: [id number] Date of Birth/Gender: Treating RN: [birth year] (67 y.o. [name] Primary Care Physician: [name] Other Clinician: Referring Physician: Treating Physician/Extender: Jasmine Pang in Treatment: 5 Education Assessment Education Provided To: Patient Education Topics Provided Wound/Skin Impairment: Methods: Explain/Verbal Responses: Return demonstration correctly Electronic Signature(s) Signed: 10/08/2022 3:46:49 PM By: Karie Schwalbe RN Entered By: Karie Schwalbe on 10/08/2022 12:20:49 ARAYIA, CHHABRA D ([id number]) [id number] Page 6 of 9 -------------------------------------------------------------------------------- Wound Assessment Details Patient Name: Date of Service: MIEKE, MAUSS 10/08/2022 9:30 A M Medical Record Number: [id number] Patient Account Number: [id number] Date of Birth/Sex: Treating RN: 1955-07-08 (67 y.o. [name] Primary Care Tzvi Economou: [name] Other Clinician: Referring Tanaja Ganger: Treating Alecxis Baltzell/Extender: Jasmine Pang in Treatment: 5 Wound Status Wound Number: 10 Primary Abscess Etiology: Wound Location: Left Abdomen - Lower Quadrant Wound Open Wounding Event: Surgical Injury Status: Date Acquired: 02/06/2017 Comorbid Cataracts,  Lymphedema, Hypertension, Peripheral Venous Weeks Of Treatment: 0 History: Disease, Osteoarthritis Clustered Wound: No Photos Wound Measurements Length: (cm) 2.5 Width: (cm) 1.3 Depth: (cm) 0.1 Area: (cm) 2.553 Volume: (cm) 0.255 % Reduction in Area: % Reduction in Volume: Epithelialization: Small (1-33%) Tunneling: No Undermining: No Wound Description Classification: Full Thickness Without Exposed Support Structures Exudate Amount: Medium Exudate Type: Serosanguineous Exudate Color: red, brown Foul Odor After Cleansing: No Slough/Fibrino Yes Wound Bed Granulation Amount: Small (1-33%) Exposed Structure Granulation Quality: Red Fascia Exposed: No Necrotic Amount: Large (67-100%) Fat Layer (Subcutaneous Tissue) Exposed: Yes Necrotic Quality: Adherent Slough Tendon Exposed: No Muscle Exposed: No Joint Exposed: No Bone Exposed: No Periwound Skin Texture Texture Color No Abnormalities Noted: No No Abnormalities Noted: Yes Scarring: Yes Temperature / Pain Temperature: No Abnormality Moisture No Abnormalities Noted: Yes Treatment Notes Wound #10 (Abdomen - Lower Quadrant) Wound Laterality: Left Cleanser Soap and Water Discharge Instruction: May shower and wash wound with dial antibacterial soap and water prior to dressing change. BIAK, VOELLER D ([id number]) [id number] Page 7 of 9 Vashe 5.8 (oz) Discharge Instruction: Cleanse the wound with Vashe prior to applying a clean dressing using gauze sponges, not tissue or cotton balls. Wound Cleanser Discharge Instruction: Cleanse the wound with wound cleanser prior to applying a clean dressing using gauze sponges, not tissue or cotton balls. Peri-Wound Care Topical Primary Dressing Maxorb Extra Ag+ Alginate Dressing, 2x2 (in/in) Discharge Instruction: Apply to wound bed as instructed Secondary Dressing ABD Pad, 8x10 Discharge Instruction: Apply over primary dressing as directed. Secured  With [employer] [facility] T 2x10 (in/yd) ape Discharge Instruction: Secure with tape as directed. Compression Wrap Compression Stockings Add-Ons Electronic Signature(s) Signed: 10/08/2022 3:46:49 PM By: Karie Schwalbe RN Entered By: Karie Schwalbe on 10/08/2022 07:29:22 -------------------------------------------------------------------------------- Wound Assessment Details Patient Name: Date of Service: [name] 10/08/2022 9:30 A M Medical Record Number: [id number] Patient Account Number: [id number] Date  of Birth/Sex: Treating RN: Apr 05, 1955 (67 y.o. [name] Primary Care Jencarlo Bonadonna: [name] Other Clinician: Referring Dacia Capers: Treating Noboru Bidinger/Extender: Jasmine Pang in Treatment: 5 Wound Status Wound Number: 9 Primary Vasculitis Etiology: Wound Location: Left, Distal, Anterior Lower Leg Wound Open Wounding Event: Gradually Appeared Status: Date Acquired: 07/14/2022 Comorbid Cataracts, Lymphedema, Hypertension, Peripheral Venous Weeks Of Treatment: 5 History: Disease, Osteoarthritis Clustered Wound: No Photos Wound Measurements Length: (cm) 1.5 Width: (cm) 1.6 Depth: (cm) 0.2 Nettle, Adelyn D ([id number]) Area: (cm) 1.885 Volume: (cm) 0.377 % Reduction in Area: 80% % Reduction in Volume: 60% Epithelialization: Small (1-33%) [id number] Page 8 of 9 Tunneling: No Undermining: No Wound Description Classification: Full Thickness Without Exposed Support Structures Exudate Amount: Medium Exudate Type: Serosanguineous Exudate Color: red, brown Foul Odor After Cleansing: No Slough/Fibrino Yes Wound Bed Granulation Amount: Small (1-33%) Exposed Structure Granulation Quality: Red Fascia Exposed: No Necrotic Amount: Large (67-100%) Fat Layer (Subcutaneous Tissue) Exposed: Yes Necrotic Quality: Eschar, Adherent Slough Tendon Exposed: No Muscle Exposed: No Joint Exposed: No Bone  Exposed: No Periwound Skin Texture Texture Color No Abnormalities Noted: No No Abnormalities Noted: No Scarring: Yes Hemosiderin Staining: Yes Moisture Temperature / Pain No Abnormalities Noted: Yes Temperature: No Abnormality Treatment Notes Wound #9 (Lower Leg) Wound Laterality: Left, Anterior, Distal Cleanser Vashe 5.8 (oz) Discharge Instruction: Cleanse the wound with Vashe prior to applying a clean dressing using gauze sponges, not tissue or cotton balls. Peri-Wound Care Topical Gentamicin Discharge Instruction: As directed by physician Mupirocin Ointment Discharge Instruction: Apply Mupirocin (Bactroban) as instructed Primary Dressing Maxorb Extra Ag+ Alginate Dressing, 4x4.75 (in/in) Discharge Instruction: Apply to wound bed as instructed Secondary Dressing ABD Pad, 8x10 Discharge Instruction: Apply over primary dressing as directed. Secured With Compression Wrap FourPress (4 layer compression wrap) Discharge Instruction: Apply four layer compression as directed. May also use Urgo K2 compression system as alternative. Compression Stockings Add-Ons Electronic Signature(s) Signed: 10/08/2022 1:56:44 PM By: Dayton Scrape Signed: 10/08/2022 3:46:49 PM By: Karie Schwalbe RN Entered By: Dayton Scrape on 10/08/2022 07:08:13 Vitals Details -------------------------------------------------------------------------------- Tiburcio Bash D ([id number]) [id number] Page 9 of 9 Patient Name: Date of Service: CHESNIE, PRESSMAN 10/08/2022 9:30 A M Medical Record Number: [id number] Patient Account Number: [id number] Date of Birth/Sex: Treating RN: 06-Aug-1955 (67 y.o. F) Primary Care Marguriete Wootan: [name] Other Clinician: Referring Tanise Russman: Treating Adisa Vigeant/Extender: Jasmine Pang in Treatment: 5 Vital Signs Time Taken: 09:59 Temperature (F): 97.6 Height (in): 62 Pulse (bpm): 99 Weight (lbs): 221 Respiratory Rate  (breaths/min): 18 Body Mass Index (BMI): 40.4 Blood Pressure (mmHg): 131/85 Reference Range: 80 - 120 mg / dl Electronic Signature(s) Signed: 10/08/2022 1:56:44 PM By: Dayton Scrape Entered By: Dayton Scrape on 10/08/2022 06:59:32

## 2022-10-11 DIAGNOSIS — G8929 Other chronic pain: Secondary | ICD-10-CM | POA: Insufficient documentation

## 2022-10-11 DIAGNOSIS — L03311 Cellulitis of abdominal wall: Secondary | ICD-10-CM | POA: Insufficient documentation

## 2022-10-11 NOTE — Assessment & Plan Note (Signed)
I will send rx Augmentin. Pt encouraged to show Wound care provider area on her abdomen.

## 2022-10-11 NOTE — Assessment & Plan Note (Signed)
Echo results from Jan 2023 were reviewed in full detail.

## 2022-10-11 NOTE — Assessment & Plan Note (Signed)
Her sx are likely due to osteoarthritis. She is now ambulatory with a walker. She may apply topical Voltaren gel to both knees as needed. If her sx persist/worsen, I will refer her to Ortho for further evaluation.

## 2022-10-11 NOTE — Assessment & Plan Note (Signed)
Previous labs reviewed, her A1c has been elevated in the past. I will check an A1c today. Reminded to avoid refined sugars including sugary drinks/foods and processed meats including bacon, sausages and deli meats.  

## 2022-10-14 NOTE — Assessment & Plan Note (Signed)
Chronic, currently on levothyroxine daily. Importance of medication compliance was discussed with the patient.

## 2022-10-14 NOTE — Assessment & Plan Note (Signed)
Chronic, encouraged to increase daily activity as tolerated.

## 2022-10-14 NOTE — Assessment & Plan Note (Signed)
Chronic, well controlled.  She will continue with lisinopril 20mg  daily, amlodipine 5mg  daily and torsemide prn. She is encouraged to follow a low sodium diet. She will f/u in four to six months for re-evaluation.

## 2022-10-14 NOTE — Assessment & Plan Note (Signed)
She is encouraged to initially strive for BMI less than 35 to decrease cardiac risk. Advised to aim for at least 150 minutes of exercise per week.

## 2022-10-16 ENCOUNTER — Encounter (HOSPITAL_BASED_OUTPATIENT_CLINIC_OR_DEPARTMENT_OTHER): Payer: Commercial Managed Care - PPO | Attending: General Surgery | Admitting: General Surgery

## 2022-10-16 DIAGNOSIS — I890 Lymphedema, not elsewhere classified: Secondary | ICD-10-CM | POA: Diagnosis not present

## 2022-10-16 DIAGNOSIS — L02211 Cutaneous abscess of abdominal wall: Secondary | ICD-10-CM | POA: Diagnosis not present

## 2022-10-16 DIAGNOSIS — I776 Arteritis, unspecified: Secondary | ICD-10-CM | POA: Insufficient documentation

## 2022-10-16 DIAGNOSIS — I872 Venous insufficiency (chronic) (peripheral): Secondary | ICD-10-CM | POA: Insufficient documentation

## 2022-10-16 DIAGNOSIS — L97928 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: Secondary | ICD-10-CM | POA: Diagnosis not present

## 2022-10-16 DIAGNOSIS — E785 Hyperlipidemia, unspecified: Secondary | ICD-10-CM | POA: Diagnosis not present

## 2022-10-16 DIAGNOSIS — I739 Peripheral vascular disease, unspecified: Secondary | ICD-10-CM | POA: Insufficient documentation

## 2022-10-16 DIAGNOSIS — M1990 Unspecified osteoarthritis, unspecified site: Secondary | ICD-10-CM | POA: Insufficient documentation

## 2022-10-16 DIAGNOSIS — E039 Hypothyroidism, unspecified: Secondary | ICD-10-CM | POA: Insufficient documentation

## 2022-10-16 DIAGNOSIS — I10 Essential (primary) hypertension: Secondary | ICD-10-CM | POA: Diagnosis not present

## 2022-10-16 DIAGNOSIS — L958 Other vasculitis limited to the skin: Secondary | ICD-10-CM | POA: Diagnosis not present

## 2022-10-16 NOTE — Progress Notes (Signed)
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 satellite area. Still with some slough accumulation but less so than on prior visits. 03/13/2022: The wound measured a little bit smaller today. There is slough and eschar accumulation, as per  usual. 03/20/2022: Her wound is larger and deeper today. The entire surface appears nonviable. There is more periwound erythema and threatened tissue breakdown. 03/27/2022: The culture that I took last week was positive for MRSA. We had empirically applied topical mupirocin to her wound. Today the wound is smaller and cleaner and less painful. There is still a layer of slough on the surface. 04/03/2022: The wound was measured slightly larger today, but appears roughly the same to my eye. There is a layer of slough on the surface. Underneath, the tissue is quite fibrotic. 04/10/2022: The wound is slightly smaller today. There is slough and eschar accumulation. For some reason, her wrap got changed  from 4 layers to 3 layers and her edema control is not as good, as a result. 04/17/2022: The return to true 4-layer compression has improved the wound considerably. It is much smaller and cleaner today. Edema control is excellent. 04/24/2022: Her wound continues to improve. It is smaller with just a bit of slough on the surface. Edema control is excellent. 05/08/2022: Her wound is smaller again today. There is still slough accumulation on the surface. Edema control remains excellent. 05/15/2022: The wound is substantially smaller this week. There is slough on the surface with excellent edema control. 05/22/2022: Her wound is smaller again this week. Minimal slough on the surface. Good edema control. 05/29/2022: Once again, her wound is smaller. There is a bit of slough accumulation. Edema control remains excellent. 06/05/2022: Her wound is smaller again by about half. There is some slough and eschar buildup. Edema control is consistently excellent. 06/12/2022: The wound was initially measured larger by couple of millimeters, but upon debridement, much of this was found to be slough and eschar. The wound is actually about the same size. 06/18/2022: The wound is down to just a couple of millimeters, covered by  eschar. 06/26/2022: Her wound is healed. 07/02/2022: Patient was seen today as part of the secondary prevention program. This is a 2-week follow-up visit. Her wound remains closed. She is wearing her compression stockings as advised. 08/31/2022: Returns with new wound, same site. Started as blister. Patient thinks compression stockings causing blisters. 09/10/2022: The small distal cluster of wounds has closed. She has a new wound proximal to the main wound. It is superficial and limited to breakdown of skin and appears secondary to friction. The main wound has a fibrotic surface with slough accumulation. Edema control is acceptable. 09/17/2022: The proximal wound has eschar on the surface. Underneath, there is a little bit of slough. The fibrotic surface of the main wound has not really improved, although there is some granulation tissue in the center. Edema control is good. 10/01/2022: The proximal wound is almost completely closed with just a glisten under illumination suggesting that it is not completely healed. The more distal wound is smaller by about half a centimeter. There is slough accumulation on the surface and it is a little bit less fibrotic today. 10/08/2022: The proximal wound is healed. The more distal wound is smaller. There is some slough accumulation and a little bit of eschar around the edges. Today, the patient pointed out a wound on her left lower abdomen that has been present since a colostomy reversal in 2018. She has not ever discussed this [name], [name] ([id number]) [id number] Page 11 of 15 with the surgeon that performed her ostomy takedown and this is the first time she has mentioned it to me. The tissue was very friable and apparently her PCP just started her on Augmentin, although I do not see any obvious signs of infection. 10/16/2022: The leg wound is smaller again today and more superficial. It is much cleaner with very little slough on the  surface. The old ostomy site still has hypertrophic granulation tissue present. She is awaiting approval from insurance to see Dr. Maisie Fus, the surgeon that performed her ostomy reversal. Patient History Information obtained from Patient. Family History Heart Disease - Mother,Father, Hypertension - Mother,Father, Kidney Disease - Mother, Thyroid Problems - Mother, No family history of Cancer, Diabetes, Hereditary Spherocytosis, Lung Disease, Seizures, Stroke, Tuberculosis. Social History Never smoker, Marital Status - Single, Alcohol Use - Never, Drug Use - No History, Caffeine Use -  Daily - coffee. Medical History Eyes Patient has history of Cataracts Hematologic/Lymphatic Patient has history of Lymphedema Cardiovascular Patient has history of Hypertension, Peripheral Venous Disease Integumentary (Skin) Denies history of History of Burn Musculoskeletal Patient has history of Osteoarthritis Hospitalization/Surgery History - colostomy reversal. 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 Medical A Surgical History Notes nd Constitutional Symptoms (General Health) morbid obesity Cardiovascular schamberg disease, hyperlipidemia Gastrointestinal diverticulitis , h/o obstruction due to diverticulitis , colostomy and colostomy reversal Endocrine hypothyroidism Objective Constitutional no acute distress. Vitals Time Taken: 9:48 AM, Height: 62 in, Weight: 221 lbs, BMI: 40.4, Temperature: 97.6 F, Pulse: 73 bpm, Respiratory Rate: 18 breaths/min, Blood Pressure: 123/71 mmHg. Respiratory Normal work of breathing on room air. General Notes: 10/16/2022: The leg wound is smaller again today and more superficial. It is much cleaner with very little slough on the surface. The old ostomy site still has hypertrophic granulation tissue present. Integumentary (Hair, Skin) Wound #10 status is Open. Original cause of wound was Surgical Injury. The date acquired was: 02/06/2017. The wound has been in treatment  1 weeks. The wound is located on the Left Abdomen - Lower Quadrant. The wound measures 2.2cm length x 1.2cm width x 0.1cm depth; 2.073cm^2 area and 0.207cm^3 volume. There is Fat Layer (Subcutaneous Tissue) exposed. There is no tunneling or undermining noted. There is a medium amount of serosanguineous drainage noted. There is small (1-33%) red granulation within the wound bed. There is a large (67-100%) amount of necrotic tissue within the wound bed including Adherent Slough. The periwound skin appearance had no abnormalities noted for moisture. The periwound skin appearance had no abnormalities noted for color. The periwound skin appearance exhibited: Scarring. Periwound temperature was noted as No Abnormality. Wound #9 status is Open. Original cause of wound was Gradually Appeared. The date acquired was: 07/14/2022. The wound has been in treatment 6 weeks. The wound is located on the [facility] Lower Leg. The wound measures 1cm length x 1.4cm width x 0.2cm depth; 1.1cm^2 area and 0.22cm^3 volume. There is Fat Layer (Subcutaneous Tissue) exposed. There is no tunneling or undermining noted. There is a medium amount of serosanguineous drainage noted. There is small (1-33%) red granulation within the wound bed. There is a large (67-100%) amount of necrotic tissue within the wound bed including Eschar and Adherent Slough. The periwound skin appearance had no abnormalities noted for moisture. The periwound skin appearance exhibited: Scarring, Hemosiderin Staining. Periwound temperature was noted as No Abnormality. 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 There was a Selective/Open Wound Non-Viable Tissue Debridement with a total area of 1.1 sq cm performed by [name], MD. With the following instrument(s): Curette to remove Non-Viable tissue/material. Material removed includes [facility] after achieving pain control using Lidocaine 4% Topical Solution. No specimens were taken. A time out was conducted at 10:12, prior to the start of the procedure. A Minimum amount of bleeding was controlled with Pressure. The procedure was tolerated well with a pain level of 0 throughout and a pain level of 0 following the procedure. Post Debridement Measurements: 1cm length x 1.4cm width x 0.2cm depth; 0.22cm^3 volume. Character of Wound/Ulcer Post Debridement is improved. Post procedure Diagnosis  Wound #9: Same as Pre-Procedure General Notes: Scribed for Dr. Lady Gary by J.[name]. 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 There was a Four Layer Compression Therapy Procedure by Karie Schwalbe, RN. Post procedure Diagnosis Wound #9: Same as Pre-Procedure Wound #10 Pre-procedure diagnosis of Wound #10 is an Abscess located on the Left Abdomen - Lower Quadrant . An Chemical Cauterization procedure was performed by [name], MD. Post procedure Diagnosis Wound #10: Same as Pre-Procedure Plan Follow-up Appointments: Return Appointment in 1 week. - Dr. Lady Gary 10/22/22 at 9:30am Room 3 Return Appointment in 2 weeks. - Please ask front desk for appointments Discharge From [facility] Services: Discharge from Wound Care Center - Please keep wearing compression stockings. Anesthetic: (In clinic) Topical Lidocaine 4% applied to wound bed Bathing/ Shower/ Hygiene: May shower with protection but do not get wound dressing(s) wet. Protect dressing(s) with water repellant cover (for example, large plastic bag) or  a cast cover and may then take shower. WOUND #10: - Abdomen - Lower Quadrant Wound Laterality: Left Cleanser: Soap and Water 1 x Per Day/30 Days Discharge Instructions: May shower and wash wound with dial antibacterial soap and water prior to dressing change. Cleanser: Vashe 5.8 (oz) 1 x Per Day/30 Days Discharge Instructions: Cleanse the wound with Vashe prior to applying a clean dressing using gauze sponges, not tissue or cotton balls. Cleanser: Wound Cleanser 1 x Per Day/30 Days Discharge Instructions: Cleanse the wound with wound cleanser prior to applying a clean dressing using gauze sponges, not tissue or cotton balls. Prim Dressing: Maxorb Extra Ag+ Alginate Dressing, 2x2 (in/in) 1 x Per Day/30 Days ary Discharge Instructions: Apply to wound bed as instructed Secondary Dressing: ABD Pad, 8x10 1 x Per Day/30 Days Discharge Instructions: Apply over primary dressing as directed. Secured With: 55M Medipore Scientist, research (life sciences) Surgical T 2x10 (in/yd) 1 x Per Day/30 Days ape Discharge Instructions: Secure with tape as directed. WOUND #9: - Lower Leg Wound Laterality: Left, Anterior, Distal Cleanser: Vashe 5.8 (oz) 1 x Per Week/30 Days Discharge Instructions: Cleanse the wound with Vashe prior to applying a clean dressing using gauze sponges, not tissue or cotton balls. Topical: Gentamicin 1 x Per Week/30 Days Discharge Instructions: As directed by physician Topical: Mupirocin Ointment 1 x Per Week/30 Days Discharge Instructions: Apply Mupirocin (Bactroban) as instructed Prim Dressing: Maxorb Extra Ag+ Alginate Dressing, 4x4.75 (in/in) 1 x Per Week/30 Days ary Discharge Instructions: Apply to wound bed as instructed Secondary Dressing: ABD Pad, 8x10 1 x Per Week/30 Days Discharge Instructions: Apply over primary dressing as directed. Com pression Wrap: FourPress (4 layer compression wrap) 1 x Per Week/30 Days Discharge Instructions: Apply four layer compression as directed. May also use Urgo K2  compression system as alternative. 10/16/2022: The leg wound is smaller again today and more superficial. It is much cleaner with very little slough on the surface. The old ostomy site still has hypertrophic granulation tissue present. She is awaiting approval from insurance to see Dr. Maisie Fus, the surgeon that performed her ostomy reversal. I used a curette to debride slough from the leg wound. Will continue the mixture of topical gentamicin and mupirocin with silver alginate and Urgo 4-layer compression program. I chemically cauterized the hypertrophic granulation tissue at the old ostomy site using silver nitrate. We will continue silver alginate here. Follow-up in 1 week. SIMRAN, GRZYBEK ([id number]) [id number] Page 13 of 15 Electronic Signature(s) Signed: 10/16/2022 10:22:28 AM By: [name] MD FACS Entered By: [name] on  10/16/2022 07:22:28 -------------------------------------------------------------------------------- HxROS Details Patient Name: Date of Service: [name], [name] 10/16/2022 9:30 A M Medical Record Number: [id number] Patient Account Number: [id number] Date of Birth/Sex: Treating RN: 06-Sep-1955 (67 y.o. F) Primary Care Provider: [name] Other Clinician: Referring Provider: Treating Provider/Extender: Jasmine Pang in Treatment: 6 Information Obtained From Patient Constitutional Symptoms (General Health) Medical History: Past Medical History Notes: morbid obesity Eyes Medical History: Positive for: Cataracts Hematologic/Lymphatic Medical History: Positive for: Lymphedema Cardiovascular Medical History: Positive for: Hypertension; Peripheral Venous Disease Past Medical History Notes: schamberg disease, hyperlipidemia Gastrointestinal Medical History: Past Medical History Notes: diverticulitis , h/o obstruction due to diverticulitis , colostomy and colostomy reversal Endocrine Medical  History: Past Medical History Notes: hypothyroidism Integumentary (Skin) Medical History: Negative for: History of Burn Musculoskeletal Medical History: Positive for: Osteoarthritis HBO Extended History Items Eyes: Cataracts Immunizations Pneumococcal Vaccine: Received Pneumococcal VaccinationPRERANA, JODOIN ([id number]) [id number] Page 14 of 15 Received Pneumococcal Vaccination On or After 60th Birthday: Yes Implantable Devices None Hospitalization / Surgery History Type of Hospitalization/Surgery colostomy reversal 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 Family and Social History Cancer: No; Diabetes: No; Heart Disease: Yes - Mother,Father; Hereditary Spherocytosis: No; Hypertension: Yes - Mother,Father; Kidney Disease: Yes - Mother; Lung Disease: No; Seizures: No; Stroke: No; Thyroid Problems: Yes - Mother; Tuberculosis: No; Never smoker; Marital Status - Single; Alcohol Use: Never; Drug Use: No History; Caffeine Use: Daily - coffee; Financial Concerns: No; Food, Clothing or Shelter Needs: No; Support System Lacking: No; Transportation Concerns: No Electronic Signature(s) Signed: 10/16/2022 10:54:18 AM By: [name] MD FACS Entered By: [name] on 10/16/2022 07:20:08 -------------------------------------------------------------------------------- SuperBill Details Patient Name: Date of Service: Erin Rosier [name] 10/16/2022 Medical Record Number: [id number] Patient Account Number: [id number] Date of Birth/Sex: Treating RN: 21-Apr-1955 (67 y.o. 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 with other specif Modifier: ied severity Quantity: 1 : CPT4 Code: [id number] Description: 17250 - CHEM CAUT GRANULATION TISS ICD-10 Diagnosis Description L98.499 Non-pressure chronic ulcer of skin of other sites with unspecified severity Modifier: Quantity: 1 Physician Procedures : CPT4 Code Description Modifier [id number] 99214 - WC PHYS LEVEL 4 - EST PT 25 ICD-10 Diagnosis Description L97.928 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 L98.499 Non-pressure chronic ulcer of skin of  other sites with unspecified severity 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 Quantity: 1 : [id number] 97597 - WC PHYS DEBR WO ANESTH 20 SQ CM ICD-10 Diagnosis Description L97.928 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

## 2022-10-16 NOTE — Progress Notes (Signed)
[name], [name] ([id number]) [id number] Page 1 of 9 Visit Report for 10/16/2022 Arrival Information Details Patient Name: Date of Service: [name], [name] 10/16/2022 9:30 A M Medical Record Number: [id number] Patient Account Number: [id number] Date of Birth/Sex: Treating RN: November 06, 1955 (67 y.o. F) Primary Care Katrenia Alkins: [name] Other Clinician: Referring Stryker Veasey: Treating Taji Barretto/Extender: Jasmine Pang in Treatment: 6 Visit Information History Since Last Visit All ordered tests and consults were completed: No Patient Arrived: Dan Humphreys Added or deleted any medications: No Arrival Time: 09:48 Any new allergies or adverse reactions: No Accompanied By: self Had a fall or experienced change in No Transfer Assistance: None activities of daily living that may affect Patient Identification Verified: Yes risk of falls: Secondary Verification Process Completed: Yes Signs or symptoms of abuse/neglect since last visito No Patient Requires Transmission-Based Precautions: No Hospitalized since last visit: No Patient Has Alerts: No Implantable device outside of the clinic excluding No cellular tissue based products placed in the center since last visit: Pain Present Now: No Electronic Signature(s) Signed: 10/16/2022 11:26:37 AM By: Dayton Scrape Entered By: Dayton Scrape on 10/16/2022 06:48:21 -------------------------------------------------------------------------------- Compression Therapy Details Patient Name: Date of Service: [name] 10/16/2022 9:30 A M Medical Record Number: [id number] Patient Account Number: [id number] Date of Birth/Sex: Treating RN: 1955-02-18 (67 y.o. [name] Primary Care Irish Breisch: [name] Other Clinician: Referring Tamecia Mcdougald: Treating Jan Walters/Extender: Jasmine Pang in Treatment: 6 Compression Therapy Performed for Wound Assessment: Wound #9  Left,Distal,Anterior Lower Leg Performed By: Clinician Karie Schwalbe, RN Compression Type: Four Layer Post Procedure Diagnosis Same as Pre-procedure Electronic Signature(s) Signed: 10/16/2022 5:11:02 PM By: Karie Schwalbe RN Entered By: Karie Schwalbe on 10/16/2022 07:17:27 Erin Bash [name] ([id number]) [id number] Page 2 of 9 -------------------------------------------------------------------------------- Encounter Discharge Information Details Patient Name: Date of Service: [name] 10/16/2022 9:30 A M Medical Record Number: [id number] Patient Account Number: [id number] Date of Birth/Sex: Treating RN: February 20, 1955 (67 y.o. [name] Primary Care Vinnie Bobst: [name] Other Clinician: Referring Cheresa Siers: Treating Brynne Doane/Extender: Jasmine Pang in Treatment: 6 Encounter Discharge Information Items Post Procedure Vitals Discharge Condition: Stable Temperature (F): 97.6 Ambulatory Status: Walker Pulse (bpm): 73 Discharge Destination: Home Respiratory Rate (breaths/min): 18 Transportation: Private Auto Blood Pressure (mmHg): 123/71 Accompanied By: self Schedule Follow-up Appointment: Yes Clinical Summary of Care: Patient Declined Electronic Signature(s) Signed: 10/16/2022 5:11:02 PM By: Karie Schwalbe RN Entered By: Karie Schwalbe on 10/16/2022 14:08:22 -------------------------------------------------------------------------------- Lower Extremity Assessment Details Patient Name: Date of Service: [name], SEHORN [name] 10/16/2022 9:30 A M Medical Record Number: [id number] Patient Account Number: [id number] Date of Birth/Sex: Treating RN: [birth year] (67 y.o. [name] Primary Care Nolin Grell: [name] Other Clinician: Referring Chantay Whitelock: Treating Calvin Jablonowski/Extender: Jasmine Pang in Treatment: 6 Edema Assessment Assessed: [Left: No] [Right: No] [Left: Edema] [Right:  :] Calf Left: Right: Point of Measurement: 34 cm From Medial Instep 38.2 cm 45.5 cm Ankle Left: Right: Point of Measurement: 9 cm From Medial Instep 22 cm 23 cm Vascular Assessment Pulses: Dorsalis Pedis Palpable: [Left:Yes] Extremity colors, hair growth, and conditions: Hair Growth on Extremity: [Left:No] [Right:No] Temperature of Extremity: [Left:Warm] [Right:Warm] Capillary Refill: [Left:< 3 seconds] [Right:< 3 seconds] Dependent Rubor: [Left:No No] [Right:No No] Toe Nail Assessment Left: Right: Thick: Yes Discolored: No Deformed: No Improper Length and Hygiene: No [name] ([id number]) [id number] Page 3 of 9 Electronic Signature(s) Signed: 10/16/2022 5:11:02 PM By: Karie Schwalbe RN Entered By: Karie Schwalbe on 10/16/2022 07:10:51 --------------------------------------------------------------------------------  Multi Wound Chart Details Patient Name: Date of Service: [name], [name] 10/16/2022 9:30 A M Medical Record Number: [id number] Patient Account Number: [id number] Date of Birth/Sex: Treating RN: 19-Oct-1955 (67 y.o. F) Primary Care Kaleena Corrow: [name] Other Clinician: Referring Shamere Dilworth: Treating Zarif Rathje/Extender: Jasmine Pang in Treatment: 6 Vital Signs Height(in): 62 Pulse(bpm): 73 Weight(lbs): 221 Blood Pressure(mmHg): 123/71 Body Mass Index(BMI): 40.4 Temperature(F): 97.6 Respiratory Rate(breaths/min): 18 [10:Photos:] [N/A:N/A] Left Abdomen - Lower Quadrant Left, Distal, Anterior Lower Leg N/A Wound Location: Surgical Injury Gradually Appeared N/A Wounding Event: Abscess Vasculitis N/A Primary Etiology: Cataracts, Lymphedema, Cataracts, Lymphedema, N/A Comorbid History: Hypertension, Peripheral Venous Hypertension, Peripheral Venous Disease, Osteoarthritis Disease, Osteoarthritis 02/06/2017 07/14/2022 N/A Date Acquired: 1 6 N/A Weeks of Treatment: Open Open N/A Wound Status: No  No N/A Wound Recurrence: 2.2x1.2x0.1 1x1.4x0.2 N/A Measurements L x W x [name] 2.073 1.1 N/A A (cm) : rea 0.207 0.22 N/A Volume (cm) : 18.80% 88.30% N/A % Reduction in A rea: 18.80% 76.60% N/A % Reduction in Volume: Full Thickness Without Exposed Full Thickness Without Exposed N/A Classification: Support Structures Support Structures Medium Medium N/A Exudate A mount: Serosanguineous Serosanguineous N/A Exudate Type: red, brown red, brown N/A Exudate Color: Small (1-33%) Small (1-33%) N/A Granulation A mount: Red Red N/A Granulation Quality: Large (67-100%) Large (67-100%) N/A Necrotic A mount: Adherent Slough Eschar, Adherent Slough N/A Necrotic Tissue: Fat Layer (Subcutaneous Tissue): Yes Fat Layer (Subcutaneous Tissue): Yes N/A Exposed Structures: Fascia: No Fascia: No Tendon: No Tendon: No Muscle: No Muscle: No Joint: No Joint: No Bone: No Bone: No Small (1-33%) Small (1-33%) N/A Epithelialization: N/A Debridement - Selective/Open Wound N/A Debridement: Pre-procedure Verification/Time Out N/A 10:12 N/A Taken: N/A Lidocaine 4% Topical Solution N/A Pain Control: N/A Slough N/A Tissue Debrided: N/A Non-Viable Tissue N/A Level: N/A 1.1 N/A Debridement A (sq cm): rea N/A Curette N/A Instrument: N/A Minimum N/A Bleeding: LEKECIA, UFFORD ([id number]) [phone].pdf Page 4 of 9 N/A Pressure N/A Hemostasis A chieved: N/A 0 N/A Procedural Pain: N/A 0 N/A Post Procedural Pain: N/A Procedure was tolerated well N/A Debridement Treatment Response: N/A 1x1.4x0.2 N/A Post Debridement Measurements L x W x [name] N/A 0.22 N/A Post Debridement Volume: (cm) Scarring: Yes Scarring: Yes N/A Periwound Skin Texture: No Abnormalities Noted No Abnormalities Noted N/A Periwound Skin Moisture: No Abnormalities Noted Hemosiderin Staining: Yes N/A Periwound Skin Color: No Abnormality No Abnormality N/A Temperature: N/A Compression  Therapy N/A Procedures Performed: Debridement Treatment Notes Electronic Signature(s) Signed: 10/16/2022 10:17:31 AM By: [name] MD FACS Entered By: [name] on 10/16/2022 07:17:31 -------------------------------------------------------------------------------- Multi-Disciplinary Care Plan Details Patient Name: Date of Service: [name] 10/16/2022 9:30 A M Medical Record Number: [id number] Patient Account Number: [id number] Date of Birth/Sex: Treating RN: September 21, 1955 (67 y.o. [name] Primary Care Markos Theil: [name] Other Clinician: Referring Burlie Cajamarca: Treating Karsten Howry/Extender: Jasmine Pang in Treatment: 6 Active Inactive Wound/Skin Impairment Nursing Diagnoses: Impaired tissue integrity Goals: Patient/caregiver will verbalize understanding of skin care regimen Date Initiated: 09/03/2022 Target Resolution Date: 12/12/2022 Goal Status: Active Interventions: Assess patient/caregiver ability to obtain necessary supplies Assess patient/caregiver ability to perform ulcer/skin care regimen upon admission and as needed Assess ulceration(s) every visit Provide education on ulcer and skin care Screen for HBO Treatment Activities: Skin care regimen initiated : 08/31/2022 Topical wound management initiated : 08/31/2022 Notes: Electronic Signature(s) Signed: 10/16/2022 5:11:02 PM By: Karie Schwalbe RN Entered By: Karie Schwalbe on 10/16/2022 14:06:04 Erin Bash [name] ([id number]) [id number] Page 5 of  9 -------------------------------------------------------------------------------- Pain Assessment Details Patient Name: Date of Service: EBONY, GITTENS 10/16/2022 9:30 A M Medical Record Number: [id number] Patient Account Number: [id number] Date of Birth/Sex: Treating RN: Jul 31, 1955 (67 y.o. F) Primary Care Lynetta Tomczak: [name] Other Clinician: Referring Davyd Podgorski: Treating  Kelley Knoth/Extender: Jasmine Pang in Treatment: 6 Active Problems Location of Pain Severity and Description of Pain Patient Has Paino No Site Locations Pain Management and Medication Current Pain Management: Electronic Signature(s) Signed: 10/16/2022 11:26:37 AM By: Dayton Scrape Entered By: Dayton Scrape on 10/16/2022 06:49:04 -------------------------------------------------------------------------------- Patient/Caregiver Education Details Patient Name: Date of Service: [name] 9/3/2024andnbsp9:30 A M Medical Record Number: [id number] Patient Account Number: [id number] Date of Birth/Gender: Treating RN: Apr 27, 1955 (67 y.o. [name] Primary Care Physician: [name] Other Clinician: Referring Physician: Treating Physician/Extender: Jasmine Pang in Treatment: 6 Education Assessment Education Provided To: Patient Education Topics Provided Wound/Skin Impairment: Methods: Explain/Verbal Responses: Return demonstration correctly [name], [name] ([id number]) [id number] Page 6 of 9 Electronic Signature(s) Signed: 10/16/2022 5:11:02 PM By: Karie Schwalbe RN Entered By: Karie Schwalbe on 10/16/2022 14:06:31 -------------------------------------------------------------------------------- Wound Assessment Details Patient Name: Date of Service: [name], [name] 10/16/2022 9:30 A M Medical Record Number: [id number] Patient Account Number: [id number] Date of Birth/Sex: Treating RN: 09/25/1955 (67 y.o. F) Primary Care Idara Woodside: [name] Other Clinician: Referring Ajax Schroll: Treating Al Gagen/Extender: Jasmine Pang in Treatment: 6 Wound Status Wound Number: 10 Primary Abscess Etiology: Wound Location: Left Abdomen - Lower Quadrant Wound Open Wounding Event: Surgical Injury Status: Date Acquired: 02/06/2017 Comorbid Cataracts, Lymphedema, Hypertension,  Peripheral Venous Weeks Of Treatment: 1 History: Disease, Osteoarthritis Clustered Wound: No Photos Wound Measurements Length: (cm) 2.2 Width: (cm) 1.2 Depth: (cm) 0.1 Area: (cm) 2.073 Volume: (cm) 0.207 % Reduction in Area: 18.8% % Reduction in Volume: 18.8% Epithelialization: Small (1-33%) Tunneling: No Undermining: No Wound Description Classification: Full Thickness Without Exposed Suppor Exudate Amount: Medium Exudate Type: Serosanguineous Exudate Color: red, brown t Structures Foul Odor After Cleansing: No Slough/Fibrino Yes Wound Bed Granulation Amount: Small (1-33%) Exposed Structure Granulation Quality: Red Fascia Exposed: No Necrotic Amount: Large (67-100%) Fat Layer (Subcutaneous Tissue) Exposed: Yes Necrotic Quality: Adherent Slough Tendon Exposed: No Muscle Exposed: No Joint Exposed: No Bone Exposed: No Periwound Skin Texture Texture Color No Abnormalities Noted: No No Abnormalities Noted: Yes Scarring: Yes Temperature / Pain Temperature: No Abnormality Moisture No Abnormalities NotedSHAQUISE, [name] ([id number]) [id number] Page 7 of 9 Treatment Notes Wound #10 (Abdomen - Lower Quadrant) Wound Laterality: Left Cleanser Soap and Water Discharge Instruction: May shower and wash wound with dial antibacterial soap and water prior to dressing change. Vashe 5.8 (oz) Discharge Instruction: Cleanse the wound with Vashe prior to applying a clean dressing using gauze sponges, not tissue or cotton balls. Wound Cleanser Discharge Instruction: Cleanse the wound with wound cleanser prior to applying a clean dressing using gauze sponges, not tissue or cotton balls. Peri-Wound Care Topical Primary Dressing Maxorb Extra Ag+ Alginate Dressing, 2x2 (in/in) Discharge Instruction: Apply to wound bed as instructed Secondary Dressing ABD Pad, 8x10 Discharge Instruction: Apply over primary dressing as directed. Secured With [employer] T 2x10 (in/yd) ape Discharge Instruction: Secure with tape as directed. Compression Wrap Compression Stockings Add-Ons Electronic Signature(s) Signed: 10/16/2022 5:11:02 PM By: Karie Schwalbe RN Entered By: Karie Schwalbe on 10/16/2022 07:11:21 -------------------------------------------------------------------------------- Wound Assessment Details Patient Name: Date of Service: [name] 10/16/2022 9:30 A M Medical Record Number: [id number] Patient Account  Number: [id number] Date of Birth/Sex: Treating RN: 04-27-1955 (68 y.o. F) Primary Care Chenell Lozon: [name] Other Clinician: Referring Sevan Mcbroom: Treating Kathline Banbury/Extender: Jasmine Pang in Treatment: 6 Wound Status Wound Number: 9 Primary Vasculitis Etiology: Wound Location: Left, Distal, Anterior Lower Leg Wound Open Wounding Event: Gradually Appeared Status: Date Acquired: 07/14/2022 Comorbid Cataracts, Lymphedema, Hypertension, Peripheral Venous Weeks Of Treatment: 6 History: Disease, Osteoarthritis Clustered Wound: No Photos [name], [name] ([id number]) [id number] Page 8 of 9 Wound Measurements Length: (cm) 1 Width: (cm) 1.4 Depth: (cm) 0.2 Area: (cm) 1.1 Volume: (cm) 0.22 % Reduction in Area: 88.3% % Reduction in Volume: 76.6% Epithelialization: Small (1-33%) Tunneling: No Undermining: No Wound Description Classification: Full Thickness Without Exposed Support Structures Exudate Amount: Medium Exudate Type: Serosanguineous Exudate Color: red, brown Foul Odor After Cleansing: No Slough/Fibrino Yes Wound Bed Granulation Amount: Small (1-33%) Exposed Structure Granulation Quality: Red Fascia Exposed: No Necrotic Amount: Large (67-100%) Fat Layer (Subcutaneous Tissue) Exposed: Yes Necrotic Quality: Eschar, Adherent Slough Tendon Exposed: No Muscle Exposed: No Joint Exposed: No Bone Exposed: No Periwound Skin  Texture Texture Color No Abnormalities Noted: No No Abnormalities Noted: No Scarring: Yes Hemosiderin Staining: Yes Moisture Temperature / Pain No Abnormalities Noted: Yes Temperature: No Abnormality Treatment Notes Wound #9 (Lower Leg) Wound Laterality: Left, Anterior, Distal Cleanser Vashe 5.8 (oz) Discharge Instruction: Cleanse the wound with Vashe prior to applying a clean dressing using gauze sponges, not tissue or cotton balls. Peri-Wound Care Topical Gentamicin Discharge Instruction: As directed by physician Mupirocin Ointment Discharge Instruction: Apply Mupirocin (Bactroban) as instructed Primary Dressing Maxorb Extra Ag+ Alginate Dressing, 4x4.75 (in/in) Discharge Instruction: Apply to wound bed as instructed Secondary Dressing ABD Pad, 8x10 Discharge Instruction: Apply over primary dressing as directed. Secured With Compression Wrap FourPress (4 layer compression wrap) Discharge Instruction: Apply four layer compression as directed. May also use Urgo K2 compression system as alternative. Compression [address] DORITHA, MOUSTAFA [name] ([id number]) [id number] Page 9 of 9 Add-Ons Electronic Signature(s) Signed: 10/16/2022 5:11:02 PM By: Karie Schwalbe RN Entered By: Karie Schwalbe on 10/16/2022 07:11:43 -------------------------------------------------------------------------------- Vitals Details Patient Name: Date of Service: Earnest Rosier NITA [name] 10/16/2022 9:30 A M Medical Record Number: [id number] Patient Account Number: [id number] Date of Birth/Sex: Treating RN: [birth year] (67 y.o. F) Primary Care Katura Eatherly: [name] Other Clinician: Referring Ory Elting: Treating Aubry Rankin/Extender: Jasmine Pang in Treatment: 6 Vital Signs Time Taken: 09:48 Temperature (F): 97.6 Height (in): 62 Pulse (bpm): 73 Weight (lbs): 221 Respiratory Rate (breaths/min): 18 Body Mass Index (BMI): 40.4 Blood Pressure (mmHg):  123/71 Reference Range: 80 - 120 mg / dl Electronic Signature(s) Signed: 10/16/2022 11:26:37 AM By: Dayton Scrape Entered By: Dayton Scrape on 10/16/2022 06:48:50

## 2022-10-22 ENCOUNTER — Encounter (HOSPITAL_BASED_OUTPATIENT_CLINIC_OR_DEPARTMENT_OTHER): Payer: Commercial Managed Care - PPO | Admitting: General Surgery

## 2022-10-22 DIAGNOSIS — L97928 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: Secondary | ICD-10-CM | POA: Diagnosis not present

## 2022-10-22 DIAGNOSIS — L958 Other vasculitis limited to the skin: Secondary | ICD-10-CM | POA: Diagnosis not present

## 2022-10-22 DIAGNOSIS — E039 Hypothyroidism, unspecified: Secondary | ICD-10-CM | POA: Diagnosis not present

## 2022-10-22 DIAGNOSIS — E785 Hyperlipidemia, unspecified: Secondary | ICD-10-CM | POA: Diagnosis not present

## 2022-10-22 DIAGNOSIS — I872 Venous insufficiency (chronic) (peripheral): Secondary | ICD-10-CM | POA: Diagnosis not present

## 2022-10-22 DIAGNOSIS — I776 Arteritis, unspecified: Secondary | ICD-10-CM | POA: Diagnosis not present

## 2022-10-22 DIAGNOSIS — M1990 Unspecified osteoarthritis, unspecified site: Secondary | ICD-10-CM | POA: Diagnosis not present

## 2022-10-22 DIAGNOSIS — I10 Essential (primary) hypertension: Secondary | ICD-10-CM | POA: Diagnosis not present

## 2022-10-22 DIAGNOSIS — L02211 Cutaneous abscess of abdominal wall: Secondary | ICD-10-CM | POA: Diagnosis not present

## 2022-10-22 DIAGNOSIS — I890 Lymphedema, not elsewhere classified: Secondary | ICD-10-CM | POA: Diagnosis not present

## 2022-10-22 DIAGNOSIS — I739 Peripheral vascular disease, unspecified: Secondary | ICD-10-CM | POA: Diagnosis not present

## 2022-10-23 NOTE — Progress Notes (Signed)
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 hypertrophic granulation tissue present. She is awaiting approval from insurance to see Dr. Maisie Fus, the surgeon that performed her ostomy reversal. 10/22/2022: The leg wound continues to contract. There is minimal slough on the surface. Edema control is good. The old ostomy site looks about the same, but the hypertrophic granulation tissue is less prominent today. Electronic Signature(s) Signed: 10/22/2022 10:35:37 AM By: [name] MD FACS Entered By: [name] on 10/22/2022 07:35:36 -------------------------------------------------------------------------------- Chemical Cauterization Details Patient Name: Date of Service: Erin Philips D. 10/22/2022 9:30 A M Medical Record Number: [id number] Patient Account Number: [id number] Date of Birth/Sex: Treating RN: [birth year] (67 y.o. [name] Primary Care Provider: [name] Other Clinician: Referring Provider: Treating Provider/Extender: Jasmine Pang in Treatment: 7 Procedure Performed for: Wound #10 Left Abdomen - Lower Quadrant Performed By: Physician [name], MD Post Procedure Diagnosis Same as  Pre-procedure Electronic Signature(s) Signed: 10/22/2022 11:12:56 AM By: [name] MD FACS Signed: 10/22/2022 6:01:35 PM By: Karie Schwalbe RN Entered By: Karie Schwalbe on 10/22/2022 07:07:28 -------------------------------------------------------------------------------- Physical Exam Details Patient Name: Date of Service: Erin Philips D. 10/22/2022 9:30 A M Medical Record Number: [id number] Patient Account Number: [id number] Date of Birth/Sex: Treating RN: 04/30/1955 (67 y.o. F) Primary Care Provider: [name] Other Clinician: Referring Provider: Treating Provider/Extender: Jasmine Pang in Treatment: [address] [place] D ([id number]) [id number] Page 6 of 15 Constitutional . . . . no acute distress. Respiratory Normal work of breathing on room air. Notes 10/22/2022: The leg wound continues to contract. There is minimal slough on the surface. Edema control is good. The old ostomy site looks about the same, but the hypertrophic granulation tissue is less prominent today. Electronic Signature(s) Signed: 10/22/2022 10:36:06 AM By: [name] MD FACS Entered By: [name] on 10/22/2022 07:36:06 -------------------------------------------------------------------------------- Physician Orders Details Patient Name: Date of Service: Earnest [name] [name]. 10/22/2022 9:30 A M Medical Record Number: [id number] Patient Account Number: [id number] Date of Birth/Sex: Treating RN: Dec 31, 1955 (67 y.o. 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 - Dr. Lady Gary 10/29/22 at 9:30am Return A Room 3 ppointment in 2 weeks. - Please ask front desk for appointments Return A [facility]charge From [facility] - Please keep wearing compression stockings. Anesthetic (In clinic) Topical Lidocaine 4% applied to wound bed Bathing/ Shower/ Hygiene May shower with protection but do not get wound dressing(s) wet. Protect dressing(s) with water repellant cover (for example, large plastic bag) or a cast cover and may then take shower. Wound Treatment Wound #10 - Abdomen - Lower Quadrant Wound Laterality: Left Cleanser: Soap and Water 1 x Per Day/30 Days Discharge Instructions: May shower and wash wound with dial antibacterial soap and water prior to dressing change. Cleanser: Vashe 5.8 (oz) 1 x Per Day/30 Days Discharge Instructions: Cleanse the wound with Vashe prior to applying a clean dressing using gauze sponges, not tissue or cotton balls. Cleanser: Wound Cleanser 1 x Per Day/30 Days Discharge Instructions: Cleanse the wound with wound cleanser prior to applying a clean dressing using gauze sponges, not tissue or cotton balls. Prim Dressing: Maxorb Extra Ag+ Alginate Dressing, 2x2 (in/in) 1 x Per Day/30 Days ary Discharge Instructions: Apply to wound bed as instructed Secondary Dressing: ABD Pad, 8x10 1 x Per Day/30 Days Discharge Instructions: Apply over primary dressing as directed. Secured With: 70M Medipore Scientist, research (life sciences) Surgical T 2x10 (in/yd) 1 x Per Day/30 Days ape Discharge Instructions: Secure with tape as directed. TASHAE, GLENNY ([id number]) [id number] Page 7 of 15 Wound #9 - Lower Leg Wound Laterality: Left, Anterior, Distal Cleanser: Vashe 5.8 (oz) 1 x Per Week/30 Days Discharge Instructions: Cleanse the wound with Vashe prior to applying a  clean dressing using gauze sponges, not tissue or cotton balls. Topical: Gentamicin 1 x Per Week/30 Days Discharge Instructions: As directed by physician Topical: Mupirocin Ointment 1 x Per Week/30 Days Discharge Instructions: Apply Mupirocin (Bactroban) as instructed Prim Dressing: Maxorb Extra Ag+ Alginate Dressing, 4x4.75 (in/in) 1 x Per Week/30 Days ary Discharge Instructions: Apply to wound bed as instructed Secondary Dressing: ABD Pad, 8x10 1 x Per Week/30 Days Discharge Instructions: Apply over primary dressing as directed. Compression Wrap: FourPress (4 layer compression wrap) 1 x Per Week/30 Days Discharge Instructions: Apply four layer compression as directed. May also use Urgo K2 compression system as alternative. Electronic Signature(s) Signed: 10/22/2022 11:12:56 AM By: [name] MD FACS Entered By: [name] on 10/22/2022 07:36:39 -------------------------------------------------------------------------------- Problem List Details Patient Name: Date of Service: Earnest [name] [name]. 10/22/2022 9:30 A M Medical Record Number: [id number] Patient Account Number: [id number] Date of Birth/Sex: Treating RN: January 25, 1956 (67 y.o. 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 with unspecified severity 10/08/2022 No Yes 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 08/31/2022 No Yes Inactive Problems Resolved Problems  Electronic Signature(s) Signed: 10/22/2022 10:33:21 AM By: [name] MD FACS Entered By: [name] on 10/22/2022 07:33:21 NAKITA, FODNESS D  ([id number]) [id number] Page 8 of 15 -------------------------------------------------------------------------------- Progress Note Details Patient Name: Date of Service: [name], [name] 10/22/2022 9:30 A M Medical Record Number: [id number] Patient Account Number: [id number] Date of Birth/Sex: Treating RN: [birth year] (67 y.o. F) Primary Care Provider: [name] Other Clinician: Referring Provider: Treating Provider/Extender: Jasmine Pang in Treatment: 7 Subjective Chief Complaint Information obtained from Patient 04/19/2021: The patient is here for ongoing follow-up regarding 2 left lower extremity wounds. 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 She tells [name] that she fell on the gravel driveway in July. She developed injuries on her distal lower leg which have not healed. She saw her primary physician on 11/15/2017 who noted her left shin injuries. Gave her antibiotics. At that point the wounds were almost circumferential however most were less than 1.5 cm. Weeping edema fluid was noted. She was referred here for evaluation. The patient has a history of chronic lower extremity edema. 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 She has had prior history with leg weeping fluid. She does not wear compression stockings. She is not doing anything specific to these wound areas. 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 She also has lymphedema. Put her in 3 layer compression and silver alginate last week. She has 3 small wounds with depth just lateral to the tibia. More problematically than this she has numerous shallow areas some of which are almost canal like in shape with tightly adherent painful debris. It would be very difficult and time- consuming to go through this and attempt to individually debride all these areas.. I changed her to collagen today to see if that would help with any of the surface debris on some of these wounds. Otherwise we will not be able to put this in compression we have to have someone change the dressing. The patient is not eligible for home health 12/25/17 on evaluation today patient actually appears to be doing rather well in regard to the ulcer on her lower extremity. Fortunately there does not appear to be evidence of infection at this time. She has been tolerating the dressing changes without complication. This includes the compression wrap. The only issue she had was that the wrap was initially placed over her bunion region which actually calls her some discomfort and pain. Other than that things seem to be going rather well. 01/01/2018 Seen today for follow-up and management of left lower extremity wound and lymphedema. T oday she presents with a new wound towards to the left lateral LE. Recently treated with a 7 day course of amoxicillin; reason for antibiotic dose is unknown at this time. Tolerating current treatment of collagen with 4- layer wraps. She obtained a venous reflux study on 12/25/17. Studies show on the right abnormal reflux times of the popliteal vein, great saphenous vein at the saphenofemoral junction at  the proximal thigh, great saphenous vein at the mid calf, and origin of the small saphenous vein.No superficial thrombosis. No deep vein thrombosis in the common femoral, femoral,and  popliteal veins. Left abnormal reflex times as well of the common femoral vein, popliteal vein, and a great saphenous vein at the saphenofemoral junction, In great saphenous vein at the mid thigh [name]/o thrombosis. Has any issues or concerns during visit today. Recommended follow-up to vascular specialist due to abnormalities from the venous reflux study. Denies fever, pain, chills, dizziness, nausea, or vomiting. 01/08/18 upon evaluation today the patient actually seems to be showing some signs of improvement in my opinion at this point in regard to the lower extremity ulcerated areas. She still has a lot of drainage but fortunately nothing that appears to be too significant currently. I have been very happy with the overall progress I see today compared to where things were during the last evaluation that I had with her. Nonetheless she has not had her appointment with the vein specialist as of yet in fact we were able to get this approved for her today and confirmed with them she will be seeing them on December 26. Nonetheless in general I do feel like the compression wraps is doing well for her. 01/17/18; quite a bit of improvement since last time I saw this patient she has a small open area remaining on the left lateral calf and even smaller area medially. She has lymphedema chronic stasis changes with distal skin fibrosis. She has an appointment with vascular surgery later this month 01/24/2018; the patient's medial leg has closed. Still a small open area on the left lateral leg. She states that the 4 layer compression we put on last week was too tight and she had to take it off over a few days ago. We have had resultant increase in her lymphedema in the dorsal foot and a proximal calf. Fortunately that does not seem to have resulted in any deterioration in her wounds The patient is going to need compression stockings. We have given her measurements to phone elastic therapy in [place]. She has  vascular surgery consult on December 26 02/07/18; she's had continuous contraction on the left lateral leg wound which is now very small. Currently using silver alginate under 3 layer compression She saw Dr. Myra Gianotti of vascular surgery on 02/06/18. It was noted that she had a normal reflux times in the popliteal vein, great saphenous vein at the saphenofemoral junction, great saphenous vein at the proximal thigh great saphenous vein at the mid calf and origin of the small saphenous vein. It was noted that she had significant reflux in the left saphenous veins with diameter measurements in the 0.7-0.8 cm range. It was felt she would benefit from laser ablation to help minimize the risk of ulcer recurrence. It was recommended that she wear 20-30 thigh-high compression stockings and have follow-up in 4-6 weeks 02/17/2018; I thought this lady would be healed however her compression slipped down and she developed increasing swelling and the wound is actually larger. 1/13; we had deterioration last week after the patient's compression slipped down and she developed periwound swelling. I increased her compression before layers we have been using silver alginate we are a lot better again today. She has her compression stockings in waiting 1/23; the patient's wounds are totally healed today. She has her stockings. This was almost circumferential skin damage. She follows up with Dr. Myra Gianotti of vascular surgery next Monday. READMISSION 02/21/2021 This is a now  67 year old woman that we had in clinic here discharging in January 2020 with wounds on her left calf chronic venous insufficiency. She was [name], [name] ([id number]) [id number] Page 9 of 15 discharged with 30/40 stockings. It does not sound like she has worn stockings in about a year largely from not being able to get them on herself. In November she developed new blisters on her legs an area laterally is opened into a  fairly sizable wound. She has weeping posteriorly as well. She has been using Neosporin and Band-Aids. The patient did see Dr. Myra Gianotti in 2019 and 2020. He felt she might benefit from laser ablation in her left saphenous veins although because of the wound that healed I do not think he went through with it. She might benefit from seeing him again. Her ABI on the left is 1. 1/17; patient's wound on the posterior left calf is closed she has the 2 large areas last week. We put her in 4-layer compression for edema control is a lot better. Our intake nurse noted greenish drainage and odor. We have been using silver alginate 1/25; PCR culture I did have the substantial wound area on the left lateral lower leg showed staff aureus and group A strep. Low titers of coag negative staph which are probably skin contaminants. Resistance detected to tetracycline methicillin and macrolides. I gave her a starter kit of Nuzyra 150 mg x 3 for 2 days then 300 mg for a further 7 days. Marked odor considerable increase in surrounding erythema. We are using Iodoflex last week however I have changed to silver alginate with underlying Bactroban 2/1; she is completing her [place] tomorrow. The degree of erythema around the wounds looks a lot better. Odor has improved. I gave her silver alginate and Bactroban last week and changing her back to Iodoflex to continue with ongoing debridement 2/8; the periwound looks a lot better. Surface of the wound also looks somewhat better although there is still ongoing debridement to be done we have been using Iodoflex under compression. Primary dressing and silver alginate 2/15; left posterior calf. Some improvement in the surface of the wound but still very gritty we have been using Iodoflex under compression. 04/05/2021: Left lateral posterior calf. She continues to have significant amounts of drainage, some of which is probably comprised of the Iodoflex microbleeds. There is no odor to  the drainage. The satellite lesion on the more posterior aspect of the calf is epithelializing nicely and has contracted quite a bit. There is robust granulation tissue in the dominant wound with minimal adherent slough. 04/12/2021: The satellite lesion has nearly closed. She continues to have good granulation tissue with minimal slough of the larger primary wound. She continues to have a fair amount of drainage, but I think this is less secondary to switching her dressing from Iodoflex to Prisma. 04/19/2021: The satellite lesion is almost completely epithelialized. The larger primary wound continues to contract with good granulation tissue and minimal slough. She is currently in [place] with compression. 04/26/2021: The satellite lesion has closed. The larger primary wound has contracted further and the granulation tissue is robust without being hypertrophic. Minimal slough is present. 05/03/2021: The satellite lesion remains closed. The larger primary wound has a bit of slough, but has also contracted further with good perimeter epithelialization. Good granulation tissue at the wound surface. There was some greenish drainage on the dressing when it was removed; it is not the blue-green typically associated with Pseudomonas aeruginosa, however. 05/10/2021: The primary wound  continues to contract. There is minimal slough. The granulation tissue at 9:00 is a bit hypertrophic. No significant drainage. No odor. 05/17/2021: The wound is a little bit smaller today with good granulation tissue. Minimal slough. No significant drainage or odor. 05/24/2021: The wound continues to contract and has a nice base of granulation tissue. Small amount of slough. No concern for infection. 05/31/2021: For some reason, the wound measured slightly larger today but overall it still appears to be in good condition with a nice base of granulation tissue and minimal slough. 06/07/2021: The wound is smaller today. Good granulation tissue  and minimal slough. 06/14/2021: The wound is unchanged in size. She continues to accumulate some slough. Good granulation tissue on the surface. 06/20/2021: The wound is smaller today. Minimal slough with good granulation tissue. 06/27/2021: The wound on her left lateral leg is smaller today with just a bit of slough and eschar accumulation. Unfortunately, she has opened a new superficial wound on her right lower extremity. She has 3+ pitting edema to the knees on that leg and she does not wear compression stockings. 07/04/2021: In addition to the superficial wound on her right lower extremity that she opened up last week, she has opened 3 additional sites on the same leg. Her compression wraps were clearly not in correct position when she came to clinic today as they were at about the mid calf level rather than to the tibial tuberosity. She says that they slipped earlier and she had to come back on Friday to have them redone. The wound on her left lateral leg is perhaps slightly larger with some slough accumulation. 07/11/2021: The superficial wounds on her right lower extremity are nearly closed. They just have a thin layer of eschar overlying them. The wound on her left lateral leg is a little bit shallower and has a bit of slough accumulation. 07/17/2021: The right medial lower extremity leg wounds have almost completely closed; one of them just has a tiny opening with a little bit of serous drainage. The left lateral leg wound is really unchanged. It seems to be stalled. 07/24/2021: The right leg wounds are completely closed. The left lateral leg wound is actually bigger today. It is a bit more tender. There is a minor accumulation of slough on the surface. 07/31/2021: The culture that I took last week was positive for MRSA. We added mupirocin under the silver alginate and I prescribed doxycycline. She has been tolerating this well. The wound is smaller today but does have slough accumulation. No  significant pain or drainage. 08/07/2021: She completed her course of doxycycline. The wound looks much better today. It is smaller and the periwound is less inflamed. She does have some slough accumulation on the surface. 08/14/2021: The wound continues to contract. There is slough on the wound surface, but the periwound is intact without inflammation or induration. 08/21/2021: The wound is down to just 2 small open sites. They both have a bit of slough accumulation. 08/28/2021: The wound is down to just 1 small open site with a little bit of slough and eschar accumulation. 09/04/2021: The wound continues to contract but remains open. It is clean without any slough. 09/12/2021: No real change in the overall wound dimensions, but it is flush with the surrounding skin. There is a little bit of slough accumulation. Edema control is good. 09/22/2021: The wound is smaller and even more superficial. Minimal slough accumulation. Good control of edema. [name], [name] ([id number]) [id number] Page 10 of 15 09/29/2021:  The wound continues to contract. She reports that it was a little bit "twingey" during the week. It is a little bit dry on inspection today. Light accumulation of slough. Good edema control. 10/06/2021: The wound is about the same size today. There is a little slough on the surface. Edema control is good. 10/13/2021: The wound is about the same size but more superficial. A little bit of slough on the surface. 10/23/2021: The wound is slightly smaller and continues to fill in. Minimal slough on the wound surface. 10/30/2021: The wound continues to contract but has not yet closed. Light slough and eschar present. 11/06/2021: The wound persists, but seems a little bit more epithelialized. There is still slough and eschar accumulation. 11/14/2021: The wound continues to contract but persists. Light eschar around the wound. 11/22/2021: The wound is down to just a pinhole opening.  There is eschar overlying the surface. Edema control is excellent. 11/29/2021: Her wound is closed. READMISSION 01/22/2022 This is a patient to be discharged in October. She has severe chronic venous insufficiency at that time she had a wound on her left lower leg posteriorly also the right leg at some point. These closed. We discharged her in 30/40 mm stockings from elastic therapy which she has been wearing religiously. She tells [name] that she was traumatized by a dolly while shopping at [employer] about 2 to 3 weeks ago. She has been left with a left anterior lower leg wound. She comes in in another pair of stockings other than her 3040s. She does not have an arterial issue with her last ABI in the left at 1.2 12/18; this is a patient who has chronic venous insufficiency and recurrent venous insufficiency ulcers. She has a area on her left anterior lower leg and we readmitted her to the clinic last week. We use silver alginate and 4-layer compression. ABI was 1.2 She brought in her stocking which was a 20/30 below-knee stocking from elastic therapy. She may require a 30/40 mm equivalent stockings after this wound heals. 02/06/2022: The intake nurse reported an odor coming from the wound when she first unwrapped it but this abated after the leg was washed. There is thick layer of slough on the surface. 02/13/2022: The wound is cleaner today. It measured larger, but on visual inspection, I think some crust of Iodoflex was included in the wound measurements; the actual wound itself looks about the same to slightly smaller. Edema control is good. 02/20/2022: The wound is cleaner again today. It is also measuring a little bit smaller, but there has been some moisture related periwound breakdown. Edema control is good. 02/27/2022: The intake nurse reported that the wound measures larger, but some of this ended up being crusted on Iodoflex. There is still some periwound moisture. Still with thick slough  accumulation. Edema control is good. 03/06/2022: The wound is cleaner and more superficial, but did measure larger because of the inclusion of a satellite area. Still with some slough accumulation but less so than on prior visits. 03/13/2022: The wound measured a little bit smaller today. There is slough and eschar accumulation, as per usual. 03/20/2022: Her wound is larger and deeper today. The entire surface appears nonviable. There is more periwound erythema and threatened tissue breakdown. 03/27/2022: The culture that I took last week was positive for MRSA. We had empirically applied topical mupirocin to her wound. Today the wound is smaller and cleaner and less painful. There is still a layer of slough on the surface. 04/03/2022: The wound  was measured slightly larger today, but appears roughly the same to my eye. There is a layer of slough on the surface. Underneath, the tissue is quite fibrotic. 04/10/2022: The wound is slightly smaller today. There is slough and eschar accumulation. For some reason, her wrap got changed from 4 layers to 3 layers and her edema control is not as good, as a result. 04/17/2022: The return to true 4-layer compression has improved the wound considerably. It is much smaller and cleaner today. Edema control is excellent. 04/24/2022: Her wound continues to improve. It is smaller with just a bit of slough on the surface. Edema control is excellent. 05/08/2022: Her wound is smaller again today. There is still slough accumulation on the surface. Edema control remains excellent. 05/15/2022: The wound is substantially smaller this week. There is slough on the surface with excellent edema control. 05/22/2022: Her wound is smaller again this week. Minimal slough on the surface. Good edema control. 05/29/2022: Once again, her wound is smaller. There is a bit of slough accumulation. Edema control remains excellent. 06/05/2022: Her wound is smaller again by about half. There is some slough and  eschar buildup. Edema control is consistently excellent. 06/12/2022: The wound was initially measured larger by couple of millimeters, but upon debridement, much of this was found to be slough and eschar. The wound is actually about the same size. 06/18/2022: The wound is down to just a couple of millimeters, covered by eschar. 06/26/2022: Her wound is healed. 07/02/2022: Patient was seen today as part of the secondary prevention program. This is a 2-week follow-up visit. Her wound remains closed. She is wearing her compression stockings as advised. 08/31/2022: Returns with new wound, same site. Started as blister. Patient thinks compression stockings causing blisters. 09/10/2022: The small distal cluster of wounds has closed. She has a new wound proximal to the main wound. It is superficial and limited to breakdown of skin and appears secondary to friction. The main wound has a fibrotic surface with slough accumulation. Edema control is acceptable. 09/17/2022: The proximal wound has eschar on the surface. Underneath, there is a little bit of slough. The fibrotic surface of the main wound has not really [name], [name] ([id number]) [id number] Page 11 of 15 improved, although there is some granulation tissue in the center. Edema control is good. 10/01/2022: The proximal wound is almost completely closed with just a glisten under illumination suggesting that it is not completely healed. The more distal wound is smaller by about half a centimeter. There is slough accumulation on the surface and it is a little bit less fibrotic today. 10/08/2022: The proximal wound is healed. The more distal wound is smaller. There is some slough accumulation and a little bit of eschar around the edges. Today, the patient pointed out a wound on her left lower abdomen that has been present since a colostomy reversal in 2018. She has not ever discussed this with the surgeon that performed her ostomy  takedown and this is the first time she has mentioned it to me. The tissue was very friable and apparently her PCP just started her on Augmentin, although I do not see any obvious signs of infection. 10/16/2022: The leg wound is smaller again today and more superficial. It is much cleaner with very little slough on the surface. The old ostomy site still has hypertrophic granulation tissue present. She is awaiting approval from insurance to see Dr. Maisie Fus, the surgeon that performed her ostomy reversal. 10/22/2022: The leg wound continues to contract.  There is minimal slough on the surface. Edema control is good. The old ostomy site looks about the same, but the hypertrophic granulation tissue is less prominent today. Patient History Information obtained from Patient. Family History Heart Disease - Mother,Father, Hypertension - Mother,Father, Kidney Disease - Mother, Thyroid Problems - Mother, No family history of Cancer, Diabetes, Hereditary Spherocytosis, Lung Disease, Seizures, Stroke, Tuberculosis. Social History Never smoker, Marital Status - Single, Alcohol Use - Never, Drug Use - No History, Caffeine Use - Daily - coffee. Medical History Eyes Patient has history of Cataracts Hematologic/Lymphatic Patient has history of Lymphedema Cardiovascular Patient has history of Hypertension, Peripheral Venous Disease Integumentary (Skin) Denies history of History of Burn Musculoskeletal Patient has history of Osteoarthritis Hospitalization/Surgery History - colostomy reversal. 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 Medical A Surgical History Notes nd Constitutional Symptoms (General Health) morbid obesity Cardiovascular schamberg disease, hyperlipidemia Gastrointestinal diverticulitis , h/o obstruction due to diverticulitis , colostomy and colostomy reversal Endocrine hypothyroidism Objective Constitutional no acute distress. Vitals Time Taken: 9:47 AM, Height: 62 in, Weight: 221 lbs, BMI:  40.4, Temperature: 97.8 F, Pulse: 76 bpm, Respiratory Rate: 18 breaths/min, Blood Pressure: 111/72 mmHg. Respiratory Normal work of breathing on room air. General Notes: 10/22/2022: The leg wound continues to contract. There is minimal slough on the surface. Edema control is good. The old ostomy site looks about the same, but the hypertrophic granulation tissue is less prominent today. Integumentary (Hair, Skin) Wound #10 status is Open. Original cause of wound was Surgical Injury. The date acquired was: 02/06/2017. The wound has been in treatment 2 weeks. The wound is located on the Left Abdomen - Lower Quadrant. The wound measures 2cm length x 1.2cm width x 0.1cm depth; 1.885cm^2 area and 0.188cm^3 volume. There is Fat Layer (Subcutaneous Tissue) exposed. There is no tunneling or undermining noted. There is a medium amount of serosanguineous drainage noted. There is large (67-100%) red, hyper - granulation within the wound bed. There is a small (1-33%) amount of necrotic tissue within the wound bed including Adherent Slough. The periwound skin appearance had no abnormalities noted for moisture. The periwound skin appearance had no abnormalities noted for color. The periwound skin appearance exhibited: Scarring. Periwound temperature was noted as No Abnormality. Wound #9 status is Open. Original cause of wound was Gradually Appeared. The date acquired was: 07/14/2022. The wound has been in treatment 7 weeks. The wound is located on the [facility] Lower Leg. The wound measures 0.9cm length x 0.7cm width x 0.2cm depth; 0.495cm^2 area and 0.099cm^3 volume. There is Fat Layer (Subcutaneous Tissue) exposed. There is no tunneling or undermining noted. There is a medium amount of serosanguineous drainage noted. There is small (1-33%) red granulation within the wound bed. There is a large (67-100%) amount of necrotic tissue within the wound bed including Adherent Slough. The periwound skin appearance  had no abnormalities noted for moisture. The periwound skin appearance exhibited: Scarring, Hemosiderin Staining. Periwound temperature was noted as No Abnormality. 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 There was a Selective/Open Wound Non-Viable Tissue Debridement with a total area of 0.49 sq cm performed by [name], MD. With the following instrument(s): Curette to  remove Non-Viable tissue/material. Material removed includes Slough after achieving pain control using Lidocaine 4% Topical Solution. No specimens were taken. A time out was conducted at 10:02, prior to the start of the procedure. A Minimum amount of bleeding was controlled with Pressure. The procedure was tolerated well with a pain level of 0 throughout and a pain level of 0 following the procedure. Post Debridement Measurements: 0.9cm length x 0.7cm width x 0.2cm depth; 0.099cm^3 volume. Character of Wound/Ulcer Post Debridement is improved. Post procedure Diagnosis Wound #9: Same as Pre-Procedure General Notes: Scribed for Dr. Lady Gary by J.[name]. 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 There was a Four Layer Compression Therapy Procedure by Karie Schwalbe, RN. Post procedure Diagnosis Wound #9: Same as Pre-Procedure Notes: Or URGO K2. Wound #10 Pre-procedure diagnosis of Wound #10 is an Abscess located on the Left Abdomen - Lower Quadrant . An Chemical Cauterization procedure was performed by [name],  MD. Post procedure Diagnosis Wound #10: Same as Pre-Procedure Plan Follow-up Appointments: Return Appointment in 1 week. - Dr. Lady Gary 10/29/22 at 9:30am Room 3 Return Appointment in 2 weeks. - Please ask front desk for appointments Discharge From [facility] Services: Discharge from [facility]ound [facility] Center - Please keep wearing compression stockings. Anesthetic: (In clinic) Topical Lidocaine 4% applied to wound bed Bathing/ Shower/ Hygiene: May shower with protection but do not get wound dressing(s) wet. Protect dressing(s) with water repellant cover (for example, large plastic bag) or a cast cover and may then take shower. WOUND #10: - Abdomen - Lower Quadrant Wound Laterality: Left Cleanser: Soap and Water 1 x Per Day/30 Days Discharge Instructions: May shower and wash wound with dial antibacterial soap and water prior to dressing change. Cleanser: Vashe 5.8 (oz) 1 x Per Day/30 Days Discharge Instructions: Cleanse the wound with Vashe prior to applying a clean dressing using gauze sponges, not tissue or cotton balls. Cleanser: Wound Cleanser 1 x Per Day/30 Days Discharge Instructions: Cleanse the wound with wound cleanser prior to applying a clean dressing using gauze sponges, not tissue or cotton balls. Prim Dressing: Maxorb Extra Ag+ Alginate Dressing, 2x2 (in/in) 1 x Per Day/30 Days ary Discharge Instructions: Apply to wound bed as instructed Secondary Dressing: ABD Pad, 8x10 1 x Per Day/30 Days Discharge Instructions: Apply over primary dressing as directed. Secured With: 65M Medipore Scientist, research (life sciences) Surgical T 2x10 (in/yd) 1 x Per Day/30 Days ape Discharge Instructions: Secure with tape as directed. WOUND #9: - Lower Leg Wound Laterality: Left, Anterior, Distal Cleanser: Vashe 5.8 (oz) 1 x Per Week/30 Days Discharge Instructions: Cleanse the wound with Vashe prior to applying a clean dressing using gauze sponges, not tissue or cotton balls. Topical: Gentamicin 1 x Per Week/30 Days Discharge  Instructions: As directed by physician Topical: Mupirocin Ointment 1 x Per Week/30 Days Discharge Instructions: Apply Mupirocin (Bactroban) as instructed Prim Dressing: Maxorb Extra Ag+ Alginate Dressing, 4x4.75 (in/in) 1 x Per Week/30 Days ary Discharge Instructions: Apply to wound bed as instructed Secondary Dressing: ABD Pad, 8x10 1 x Per Week/30 Days Discharge Instructions: Apply over primary dressing as directed. Com pression Wrap: FourPress (4 layer compression wrap) 1 x Per Week/30 Days Discharge Instructions: Apply four layer compression as directed. May also use Urgo K2 compression system as alternative. [name], WENGER ([id number]) [id number] Page 13 of 15 10/22/2022: The leg wound continues to contract. There is minimal slough on the surface. Edema control is good. The old ostomy site looks about the same, but  the hypertrophic granulation tissue is less prominent today. I used a curette to debride slough from the leg wound and silver nitrate to chemically cauterize the hypertrophic granulation tissue on her abdomen. We will continue the mixture of topical gentamicin and mupirocin with silver alginate and an Urgo 4-layer compression equivalent wrap for the leg and silver alginate to the abdomen. She is still awaiting an appointment with surgery. Follow-up in 1 week. Electronic Signature(s) Signed: 10/22/2022 10:37:17 AM By: [name] MD FACS Entered By: [name] on 10/22/2022 07:37:17 -------------------------------------------------------------------------------- HxROS Details Patient Name: Date of Service: Earnest [name] [name]. 10/22/2022 9:30 A M Medical Record Number: [id number] Patient Account Number: [id number] Date of Birth/Sex: Treating RN: 1955/03/19 (67 y.o. F) Primary Care Provider: [name] Other Clinician: Referring Provider: Treating Provider/Extender: Jasmine Pang in Treatment: 7 Information  Obtained From Patient Constitutional Symptoms (General Health) Medical History: Past Medical History Notes: morbid obesity Eyes Medical History: Positive for: Cataracts Hematologic/Lymphatic Medical History: Positive for: Lymphedema Cardiovascular Medical History: Positive for: Hypertension; Peripheral Venous Disease Past Medical History Notes: schamberg disease, hyperlipidemia Gastrointestinal Medical History: Past Medical History Notes: diverticulitis , h/o obstruction due to diverticulitis , colostomy and colostomy reversal Endocrine Medical History: Past Medical History Notes: hypothyroidism Integumentary (Skin) Medical History: Negative for: History of Burn Musculoskeletal Medical History: Positive for: Osteoarthritis ADLEAN, NEWHARD ([id number]) [id number] Page 14 of 15 HBO Extended History Items Eyes: Cataracts Immunizations Pneumococcal Vaccine: Received Pneumococcal Vaccination: Yes Received Pneumococcal Vaccination On or After 60th Birthday: Yes Implantable Devices None Hospitalization / Surgery History Type of Hospitalization/Surgery colostomy reversal 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 Family and Social History Cancer: No; Diabetes: No; Heart Disease: Yes - Mother,Father; Hereditary Spherocytosis: No; Hypertension: Yes - Mother,Father; Kidney Disease: Yes - Mother; Lung Disease: No; Seizures: No; Stroke: No; Thyroid Problems: Yes - Mother; Tuberculosis: No; Never smoker; Marital Status - Single; Alcohol Use: Never; Drug Use: No History; Caffeine Use: Daily - coffee; Financial Concerns: No; Food, Clothing or Shelter Needs: No; Support System Lacking: No; Transportation Concerns: No Electronic Signature(s) Signed: 10/22/2022 11:12:56 AM By: [name] MD FACS Entered By: [name] on 10/22/2022 07:35:42 -------------------------------------------------------------------------------- SuperBill Details Patient Name:  Date of Service: Earnest [name] [name]. 10/22/2022 Medical Record Number: [id number] Patient Account Number: [id number] Date of Birth/Sex: Treating RN: September 29, 1955 (67 y.o. 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 with other specif Modifier: ied severity Quantity: 1 : CPT4 Code: [id number] Description: 17250 - CHEM CAUT GRANULATION TISS ICD-10 Diagnosis Description L98.499 Non-pressure chronic ulcer of skin of other sites with unspecified severity Modifier: Quantity: 1 Physician Procedures : CPT4 Code Description Modifier [id number] 99214 - WC PHYS LEVEL 4 - EST PT 25 ICD-10 Diagnosis Description L97.928 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 L98.499 Non-pressure chronic ulcer of skin of  other sites with unspecified severity 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 JUVIA, SKIFFINGTON D ([id number]) [id number] Quantity: 1 df Page 15  of 15 : [id number] 97597 - WC PHYS DEBR WO ANESTH 20 SQ CM 1 ICD-10 Diagnosis Description L97.928 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

## 2022-10-23 NOTE — Progress Notes (Signed)
Erin, [name] ([id number]) [id number] Page 1 of 9 Visit Report for 10/22/2022 Arrival Information Details Patient Name: Date of Service: [name], [name] 10/22/2022 9:30 A M Medical Record Number: [id number] Patient Account Number: [id number] Date of Birth/Sex: Treating RN: May 08, 1955 (67 y.o. [name] Primary Care Jacorian Golaszewski: [name] Other Clinician: Referring Daquane Aguilar: Treating Jenniah Bhavsar/Extender: Jasmine Pang in Treatment: 7 Visit Information History Since Last Visit Added or deleted any medications: No Patient Arrived: Dan Humphreys Any new allergies or adverse reactions: No Arrival Time: 09:41 Had a fall or experienced change in No Accompanied By: self activities of daily living that may affect Transfer Assistance: Manual risk of falls: Patient Identification Verified: Yes Signs or symptoms of abuse/neglect since last visito No Patient Requires Transmission-Based Precautions: No Hospitalized since last visit: No Patient Has Alerts: No Implantable device outside of the clinic excluding No cellular tissue based products placed in the center since last visit: Has Dressing in Place as Prescribed: Yes Has Compression in Place as Prescribed: Yes Pain Present Now: No Electronic Signature(s) Signed: 10/22/2022 6:01:35 PM By: Karie Schwalbe RN Entered By: Karie Schwalbe on 10/22/2022 07:00:08 -------------------------------------------------------------------------------- Compression Therapy Details Patient Name: Date of Service: Erin Philips D. 10/22/2022 9:30 A M Medical Record Number: [id number] Patient Account Number: [id number] Date of Birth/Sex: Treating RN: 09-21-1955 (67 y.o. [name] Primary Care Demarea Lorey: [name] Other Clinician: Referring Kieran Arreguin: Treating Cammeron Greis/Extender: Jasmine Pang in Treatment: 7 Compression Therapy Performed for Wound Assessment: Wound  #9 Left,Distal,Anterior Lower Leg Performed By: Clinician Karie Schwalbe, RN Compression Type: Four Layer Post Procedure Diagnosis Same as Pre-procedure Notes Or Harlene Salts Electronic Signature(s) Signed: 10/22/2022 6:01:35 PM By: Karie Schwalbe RN Entered By: Karie Schwalbe on 10/22/2022 07:09:08 FREDA, MORSE D ([id number]) [id number] Page 2 of 9 -------------------------------------------------------------------------------- Encounter Discharge Information Details Patient Name: Date of Service: Erin, [name] 10/22/2022 9:30 A M Medical Record Number: [id number] Patient Account Number: [id number] Date of Birth/Sex: Treating RN: 02-21-1955 (67 y.o. [name] Primary Care Romell Cavanah: [name] Other Clinician: Referring Enolia Koepke: Treating Jyron Turman/Extender: Jasmine Pang in Treatment: 7 Encounter Discharge Information Items Post Procedure Vitals Discharge Condition: Stable Temperature (F): 97.8 Ambulatory Status: Walker Pulse (bpm): 76 Discharge Destination: Home Respiratory Rate (breaths/min): 18 Transportation: Private Auto Blood Pressure (mmHg): 111/72 Accompanied By: self Schedule Follow-up Appointment: Yes Clinical Summary of Care: Patient Declined Electronic Signature(s) Signed: 10/22/2022 6:01:35 PM By: Karie Schwalbe RN Entered By: Karie Schwalbe on 10/22/2022 15:01:04 -------------------------------------------------------------------------------- Lower Extremity Assessment Details Patient Name: Date of Service: Erin, [name] 10/22/2022 9:30 A M Medical Record Number: [id number] Patient Account Number: [id number] Date of Birth/Sex: Treating RN: 1955/07/26 (67 y.o. [name] Primary Care Macoy Rodwell: [name] Other Clinician: Referring Claris Pech: Treating Lanore Renderos/Extender: Jasmine Pang in Treatment: 7 Edema Assessment Assessed: [Left: No] [Right:  No] [Left: Edema] [Right: :] Calf Left: Right: Point of Measurement: 34 cm From Medial Instep 38.1 cm 45.5 cm Ankle Left: Right: Point of Measurement: 9 cm From Medial Instep 22 cm 23 cm Vascular Assessment Pulses: Dorsalis Pedis Palpable: [Left:Yes] Extremity colors, hair growth, and conditions: Hair Growth on Extremity: [Left:No] [Right:No] Temperature of Extremity: [Left:Warm] [Right:Warm] Capillary Refill: [Left:< 3 seconds] [Right:< 3 seconds] Dependent Rubor: [Left:No No] [Right:No No] Toe Nail Assessment Left: Right: ThickGARDENIA, BEIER ([id number]) [id number] Page 3 of 9 Discolored: No Deformed: Yes Improper Length and Hygiene: No Electronic Signature(s) Signed: 10/22/2022 6:01:35 PM By: Karie Schwalbe RN  Entered By: Karie Schwalbe on 10/22/2022 07:04:38 -------------------------------------------------------------------------------- Multi Wound Chart Details Patient Name: Date of Service: Erin, [name] 10/22/2022 9:30 A M Medical Record Number: [id number] Patient Account Number: [id number] Date of Birth/Sex: Treating RN: [birth year] (67 y.o. F) Primary Care Margarie Mcguirt: [name] Other Clinician: Referring Kazuo Durnil: Treating Macon Lesesne/Extender: Jasmine Pang in Treatment: 7 Vital Signs Height(in): 62 Pulse(bpm): 76 Weight(lbs): 221 Blood Pressure(mmHg): 111/72 Body Mass Index(BMI): 40.4 Temperature(F): 97.8 Respiratory Rate(breaths/min): 18 [10:Photos:] [N/A:N/A] Left Abdomen - Lower Quadrant Left, Distal, Anterior Lower Leg N/A Wound Location: Surgical Injury Gradually Appeared N/A Wounding Event: Abscess Vasculitis N/A Primary Etiology: Cataracts, Lymphedema, Cataracts, Lymphedema, N/A Comorbid History: Hypertension, Peripheral Venous Hypertension, Peripheral Venous Disease, Osteoarthritis Disease, Osteoarthritis 02/06/2017 07/14/2022 N/A Date Acquired: 2 7 N/A Weeks of Treatment: Open  Open N/A Wound Status: No No N/A Wound Recurrence: 2x1.2x0.1 0.9x0.7x0.2 N/A Measurements L x W x D (cm) 1.885 0.495 N/A A (cm) : rea 0.188 0.099 N/A Volume (cm) : 26.20% 94.70% N/A % Reduction in A rea: 26.30% 89.50% N/A % Reduction in Volume: Full Thickness Without Exposed Full Thickness Without Exposed N/A Classification: Support Structures Support Structures Medium Medium N/A Exudate A mount: Serosanguineous Serosanguineous N/A Exudate Type: red, brown red, brown N/A Exudate Color: Large (67-100%) Small (1-33%) N/A Granulation A mount: Red, Hyper-granulation Red N/A Granulation Quality: Small (1-33%) Large (67-100%) N/A Necrotic A mount: Fat Layer (Subcutaneous Tissue): Yes Fat Layer (Subcutaneous Tissue): Yes N/A Exposed Structures: Fascia: No Fascia: No Tendon: No Tendon: No Muscle: No Muscle: No Joint: No Joint: No Bone: No Bone: No Small (1-33%) Medium (34-66%) N/A Epithelialization: N/A Debridement - Selective/Open Wound N/A Debridement: Pre-procedure Verification/Time Out N/A 10:02 N/A Taken: N/A Lidocaine 4% Topical Solution N/A Pain Control: N/A Slough N/A Tissue Debrided: TYSA, CAHALANE ([id number]) [id number] Page 4 of 9 N/A Non-Viable Tissue N/A Level: N/A 0.49 N/A Debridement A (sq cm): rea N/A Curette N/A Instrument: N/A Minimum N/A Bleeding: N/A Pressure N/A Hemostasis A chieved: N/A 0 N/A Procedural Pain: N/A 0 N/A Post Procedural Pain: N/A Procedure was tolerated well N/A Debridement Treatment Response: N/A 0.9x0.7x0.2 N/A Post Debridement Measurements L x W x D (cm) N/A 0.099 N/A Post Debridement Volume: (cm) Scarring: Yes Scarring: Yes N/A Periwound Skin Texture: No Abnormalities Noted No Abnormalities Noted N/A Periwound Skin Moisture: No Abnormalities Noted Hemosiderin Staining: Yes N/A Periwound Skin Color: No Abnormality No Abnormality N/A Temperature: Chemical Cauterization  Compression Therapy N/A Procedures Performed: Debridement Treatment Notes Electronic Signature(s) Signed: 10/22/2022 10:33:29 AM By: [name] MD FACS Entered By: [name] on 10/22/2022 07:33:28 -------------------------------------------------------------------------------- Multi-Disciplinary Care Plan Details Patient Name: Date of Service: Erin Philips D. 10/22/2022 9:30 A M Medical Record Number: [id number] Patient Account Number: [id number] Date of Birth/Sex: Treating RN: [birth year] (67 y.o. [name] Primary Care Saraiah Bhat: [name] Other Clinician: Referring Caleen Taaffe: Treating Dorothey Oetken/Extender: Jasmine Pang in Treatment: 7 Active Inactive Wound/Skin Impairment Nursing Diagnoses: Impaired tissue integrity Goals: Patient/caregiver will verbalize understanding of skin care regimen Date Initiated: 09/03/2022 Target Resolution Date: 12/12/2022 Goal Status: Active Interventions: Assess patient/caregiver ability to obtain necessary supplies Assess patient/caregiver ability to perform ulcer/skin care regimen upon admission and as needed Assess ulceration(s) every visit Provide education on ulcer and skin care Screen for HBO Treatment Activities: Skin care regimen initiated : 08/31/2022 Topical wound management initiated : 08/31/2022 Notes: Electronic Signature(s) Signed: 10/22/2022 6:01:35 PM By: Karie Schwalbe RN Entered By: Karie Schwalbe on 10/22/2022 14:59:48 Tiburcio Bash D ([id number]) [id number] Page  5 of 9 -------------------------------------------------------------------------------- Pain Assessment Details Patient Name: Date of Service: MILIANI, LOCHHEAD 10/22/2022 9:30 A M Medical Record Number: [id number] Patient Account Number: [id number] Date of Birth/Sex: Treating RN: Feb 16, 1955 (67 y.o. [name] Primary Care Teofil Maniaci: [name] Other Clinician: Referring  Bryant Lipps: Treating Renesmae Donahey/Extender: Jasmine Pang in Treatment: 7 Active Problems Location of Pain Severity and Description of Pain Patient Has Paino No Site Locations Pain Management and Medication Current Pain Management: Electronic Signature(s) Signed: 10/22/2022 6:01:35 PM By: Karie Schwalbe RN Entered By: Karie Schwalbe on 10/22/2022 07:03:25 -------------------------------------------------------------------------------- Patient/Caregiver Education Details Patient Name: Date of Service: [name] 9/9/2024andnbsp9:30 A M Medical Record Number: [id number] Patient Account Number: [id number] Date of Birth/Gender: Treating RN: October 20, 1955 (67 y.o. [name] Primary Care Physician: [name] Other Clinician: Referring Physician: Treating Physician/Extender: Jasmine Pang in Treatment: 7 Education Assessment Education Provided To: Patient Education Topics Provided Wound/Skin Impairment: Methods: Explain/Verbal Responses: State content correctly MUNNI, CONLEY D ([id number]) [id number] Page 6 of 9 Electronic Signature(s) Signed: 10/22/2022 6:01:35 PM By: Karie Schwalbe RN Entered By: Karie Schwalbe on 10/22/2022 15:00:01 -------------------------------------------------------------------------------- Wound Assessment Details Patient Name: Date of Service: HOLDYN, DAS 10/22/2022 9:30 A M Medical Record Number: [id number] Patient Account Number: [id number] Date of Birth/Sex: Treating RN: Dec 23, 1955 (67 y.o. [name] Primary Care Smayan Hackbart: [name] Other Clinician: Referring Layce Sprung: Treating Denzell Colasanti/Extender: Jasmine Pang in Treatment: 7 Wound Status Wound Number: 10 Primary Abscess Etiology: Wound Location: Left Abdomen - Lower Quadrant Wound Open Wounding Event: Surgical Injury Status: Date Acquired: 02/06/2017 Comorbid  Cataracts, Lymphedema, Hypertension, Peripheral Venous Weeks Of Treatment: 2 History: Disease, Osteoarthritis Clustered Wound: No Photos Wound Measurements Length: (cm) 2 Width: (cm) 1.2 Depth: (cm) 0.1 Area: (cm) 1.885 Volume: (cm) 0.188 % Reduction in Area: 26.2% % Reduction in Volume: 26.3% Epithelialization: Small (1-33%) Tunneling: No Undermining: No Wound Description Classification: Full Thickness Without Exposed Suppor Exudate Amount: Medium Exudate Type: Serosanguineous Exudate Color: red, brown t Structures Foul Odor After Cleansing: No Slough/Fibrino Yes Wound Bed Granulation Amount: Large (67-100%) Exposed Structure Granulation Quality: Red, Hyper-granulation Fascia Exposed: No Necrotic Amount: Small (1-33%) Fat Layer (Subcutaneous Tissue) Exposed: Yes Necrotic Quality: Adherent Slough Tendon Exposed: No Muscle Exposed: No Joint Exposed: No Bone Exposed: No Periwound Skin Texture Texture Color No Abnormalities Noted: No No Abnormalities Noted: Yes Scarring: Yes Temperature / Pain Temperature: No Abnormality Moisture No Abnormalities NotedGERMAINE, SITZES ([id number]) [phone].pdf Page 7 of 9 Treatment Notes Wound #10 (Abdomen - Lower Quadrant) Wound Laterality: Left Cleanser Soap and Water Discharge Instruction: May shower and wash wound with dial antibacterial soap and water prior to dressing change. Vashe 5.8 (oz) Discharge Instruction: Cleanse the wound with Vashe prior to applying a clean dressing using gauze sponges, not tissue or cotton balls. Wound Cleanser Discharge Instruction: Cleanse the wound with wound cleanser prior to applying a clean dressing using gauze sponges, not tissue or cotton balls. Peri-Wound Care Topical Primary Dressing Maxorb Extra Ag+ Alginate Dressing, 2x2 (in/in) Discharge Instruction: Apply to wound bed as instructed Secondary Dressing ABD Pad, 8x10 Discharge Instruction: Apply over  primary dressing as directed. Secured With [employer] T 2x10 (in/yd) ape Discharge Instruction: Secure with tape as directed. Compression Wrap Compression Stockings Add-Ons Electronic Signature(s) Signed: 10/22/2022 6:01:35 PM By: Karie Schwalbe RN Entered By: Karie Schwalbe on 10/22/2022 06:58:26 -------------------------------------------------------------------------------- Wound Assessment Details Patient Name: Date of Service: [name] 10/22/2022 9:30  A M Medical Record Number: [id number] Patient Account Number: [id number] Date of Birth/Sex: Treating RN: Jul 04, 1955 (67 y.o. [name] Primary Care Sidni Fusco: [name] Other Clinician: Referring Miles Leyda: Treating Jimmye Wisnieski/Extender: Jasmine Pang in Treatment: 7 Wound Status Wound Number: 9 Primary Vasculitis Etiology: Wound Location: Left, Distal, Anterior Lower Leg Wound Open Wounding Event: Gradually Appeared Status: Date Acquired: 07/14/2022 Comorbid Cataracts, Lymphedema, Hypertension, Peripheral Venous Weeks Of Treatment: 7 History: Disease, Osteoarthritis Clustered Wound: No Photos MORGANDY, BUSSIE ([id number]) [id number] Page 8 of 9 Wound Measurements Length: (cm) 0.9 Width: (cm) 0.7 Depth: (cm) 0.2 Area: (cm) 0.495 Volume: (cm) 0.099 % Reduction in Area: 94.7% % Reduction in Volume: 89.5% Epithelialization: Medium (34-66%) Tunneling: No Undermining: No Wound Description Classification: Full Thickness Without Exposed Support Structures Exudate Amount: Medium Exudate Type: Serosanguineous Exudate Color: red, brown Foul Odor After Cleansing: No Slough/Fibrino Yes Wound Bed Granulation Amount: Small (1-33%) Exposed Structure Granulation Quality: Red Fascia Exposed: No Necrotic Amount: Large (67-100%) Fat Layer (Subcutaneous Tissue) Exposed: Yes Necrotic Quality: Adherent Slough Tendon Exposed: No Muscle  Exposed: No Joint Exposed: No Bone Exposed: No Periwound Skin Texture Texture Color No Abnormalities Noted: No No Abnormalities Noted: No Scarring: Yes Hemosiderin Staining: Yes Moisture Temperature / Pain No Abnormalities Noted: Yes Temperature: No Abnormality Treatment Notes Wound #9 (Lower Leg) Wound Laterality: Left, Anterior, Distal Cleanser Vashe 5.8 (oz) Discharge Instruction: Cleanse the wound with Vashe prior to applying a clean dressing using gauze sponges, not tissue or cotton balls. Peri-Wound Care Topical Gentamicin Discharge Instruction: As directed by physician Mupirocin Ointment Discharge Instruction: Apply Mupirocin (Bactroban) as instructed Primary Dressing Maxorb Extra Ag+ Alginate Dressing, 4x4.75 (in/in) Discharge Instruction: Apply to wound bed as instructed Secondary Dressing ABD Pad, 8x10 Discharge Instruction: Apply over primary dressing as directed. Secured With Compression Wrap FourPress (4 layer compression wrap) Discharge Instruction: Apply four layer compression as directed. May also use Urgo K2 compression system as alternative. Compression Stockings LAMESHA, ELVIS D ([id number]) [id number] Page 9 of 9 Add-Ons Electronic Signature(s) Signed: 10/22/2022 6:01:35 PM By: Karie Schwalbe RN Entered By: Karie Schwalbe on 10/22/2022 06:59:05 -------------------------------------------------------------------------------- Vitals Details Patient Name: Date of Service: [name] 10/22/2022 9:30 A M Medical Record Number: [id number] Patient Account Number: [id number] Date of Birth/Sex: Treating RN: May 12, 1955 (67 y.o. [name] Primary Care Jaishon Krisher: [name] Other Clinician: Referring Nyemah Watton: Treating Naysa Puskas/Extender: Jasmine Pang in Treatment: 7 Vital Signs Time Taken: 09:47 Temperature (F): 97.8 Height (in): 62 Pulse (bpm): 76 Weight (lbs): 221 Respiratory Rate  (breaths/min): 18 Body Mass Index (BMI): 40.4 Blood Pressure (mmHg): 111/72 Reference Range: 80 - 120 mg / dl Electronic Signature(s) Signed: 10/22/2022 6:01:35 PM By: Karie Schwalbe RN Entered By: Karie Schwalbe on 10/22/2022 07:03:15

## 2022-10-29 ENCOUNTER — Encounter (HOSPITAL_BASED_OUTPATIENT_CLINIC_OR_DEPARTMENT_OTHER): Payer: Commercial Managed Care - PPO | Admitting: General Surgery

## 2022-10-29 DIAGNOSIS — L97928 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: Secondary | ICD-10-CM | POA: Diagnosis not present

## 2022-10-29 DIAGNOSIS — L02211 Cutaneous abscess of abdominal wall: Secondary | ICD-10-CM | POA: Diagnosis not present

## 2022-10-29 DIAGNOSIS — L958 Other vasculitis limited to the skin: Secondary | ICD-10-CM | POA: Diagnosis not present

## 2022-10-29 DIAGNOSIS — I872 Venous insufficiency (chronic) (peripheral): Secondary | ICD-10-CM | POA: Diagnosis not present

## 2022-10-29 DIAGNOSIS — M1990 Unspecified osteoarthritis, unspecified site: Secondary | ICD-10-CM | POA: Diagnosis not present

## 2022-10-29 DIAGNOSIS — I739 Peripheral vascular disease, unspecified: Secondary | ICD-10-CM | POA: Diagnosis not present

## 2022-10-29 DIAGNOSIS — I890 Lymphedema, not elsewhere classified: Secondary | ICD-10-CM | POA: Diagnosis not present

## 2022-10-29 DIAGNOSIS — I776 Arteritis, unspecified: Secondary | ICD-10-CM | POA: Diagnosis not present

## 2022-10-29 DIAGNOSIS — L97822 Non-pressure chronic ulcer of other part of left lower leg with fat layer exposed: Secondary | ICD-10-CM | POA: Diagnosis not present

## 2022-10-29 DIAGNOSIS — E785 Hyperlipidemia, unspecified: Secondary | ICD-10-CM | POA: Diagnosis not present

## 2022-10-29 DIAGNOSIS — I10 Essential (primary) hypertension: Secondary | ICD-10-CM | POA: Diagnosis not present

## 2022-10-29 DIAGNOSIS — E039 Hypothyroidism, unspecified: Secondary | ICD-10-CM | POA: Diagnosis not present

## 2022-10-29 NOTE — Progress Notes (Signed)
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 10/29/2022 9:30 A M Medical Record Number: [id number] Patient Account Number: [id number] Date of Birth/Sex: Treating RN: 09-28-1955 (67 y.o. F) Primary Care Provider: [name] Other Clinician: Referring Provider: Treating Provider/Extender: Jasmine Pang in Treatment: 8 Subjective Chief Complaint Information obtained from Patient 04/19/2021: The patient is here for ongoing follow-up regarding 2 left lower extremity wounds. 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 She tells [name] that she fell on the gravel driveway in July. She developed injuries on her distal lower leg which have not healed. She saw her primary physician on 11/15/2017 who noted her left shin injuries. Gave her antibiotics. At that point the wounds were almost circumferential however most were less than 1.5 cm. Weeping edema fluid was noted. She was referred here for evaluation. The  patient has a history of chronic lower extremity edema. 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 She has had prior history with leg weeping fluid. She does not wear compression stockings. She is not doing anything specific to these wound areas. 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 She also has lymphedema. Put her in 3 layer compression and silver alginate last week. She has 3 small wounds with depth just lateral to the tibia. More problematically than ALMADELIA, [name] ([id number]) [id number] Page 10 of 17 this she has numerous shallow areas some of which are almost canal like in shape with tightly adherent painful debris. It would be very difficult and time- consuming to go through this and attempt to individually debride all these areas.. I changed her to collagen today to see if that would help with any of the surface debris on some of these wounds. Otherwise we will not be able to put this in compression we have to have someone change the dressing. The patient is not eligible for home health 12/25/17 on evaluation today patient actually appears to be doing rather well in regard to the ulcer on her lower extremity. Fortunately there does not appear to be evidence of infection at this time. She has been tolerating the dressing changes without complication. This includes the compression wrap. The only issue she had was that the wrap was initially placed over her bunion region which actually calls her some discomfort and pain. Other than that things seem to be going rather  well. 01/01/2018 Seen today for follow-up and management of left lower extremity wound and lymphedema. T oday she presents with a new wound towards to the left lateral LE. Recently treated with a 7 day course  fibrotic today. 10/08/2022: The proximal wound is healed. The more distal wound is smaller. There is some slough accumulation and a little bit of eschar  around the edges. Today, the patient pointed out a wound on her left lower abdomen that has been present since a colostomy reversal in 2018. She has not ever discussed this with the surgeon that performed her ostomy takedown and this is the first time she has mentioned it to me. The tissue was very friable and apparently her PCP just started her on Augmentin, although I do not see any obvious signs of infection. 10/16/2022: The leg wound is smaller again today and more superficial. It is much cleaner with very little slough on the surface. The old ostomy site still has hypertrophic granulation tissue present. She is awaiting approval from insurance to see Dr. Maisie Fus, the surgeon that performed her ostomy reversal. 10/22/2022: The leg wound continues to contract. There is minimal slough on the surface. Edema control is good. The old ostomy site looks about the same, but the hypertrophic granulation tissue is less prominent today. 10/29/2022: She has a new leg wound just proximal and lateral to the existing site. There is a little bit of slough on the surface. It looks as though perhaps some skin tore here. The existing leg wound is more superficial and also has a layer of slough on the surface. The old ostomy site is stable with some slough present. She still does not have an appointment with surgery. Electronic Signature(s) Signed: 10/29/2022 10:30:54 AM By: [name] MD FACS Entered By: [name] on 10/29/2022 07:30:54 [name], [name] ([id number]) [id number] Page 7 of 17 -------------------------------------------------------------------------------- Physical Exam Details Patient Name: Date of Service: [name], [name] 10/29/2022 9:30 A M Medical Record Number: [id number] Patient Account Number: [id number] Date of Birth/Sex: Treating RN: 1955-08-03 (67 y.o. F) Primary Care Provider: [name] Other Clinician: Referring Provider: Treating  Provider/Extender: Jasmine Pang in Treatment: 8 Constitutional . . . . no acute distress. Respiratory Normal work of breathing on room air. Notes 10/29/2022: She has a new leg wound just proximal and lateral to the existing site. There is a little bit of slough on the surface. It looks as though perhaps some skin tore here. The existing leg wound is more superficial and also has a layer of slough on the surface. The old ostomy site is stable with some slough present. Electronic Signature(s) Signed: 10/29/2022 10:31:50 AM By: [name] MD FACS Entered By: [name] on 10/29/2022 07:31:49 -------------------------------------------------------------------------------- Physician Orders Details Patient Name: Date of Service: [name] [name] 10/29/2022 9:30 A M Medical Record Number: [id number] Patient Account Number: [id number] Date of Birth/Sex: Treating RN: 17-Nov-1955 (67 y.o. 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 - Dr. Lady Gary 11/05/22 at 9:30am Return A Room 3 ppointment in 2 weeks. - Please ask front desk for appointments Return A Discharge From [facility] from [facility] - Please keep wearing compression stockings. Anesthetic (In clinic) Topical Lidocaine 4% applied to wound bed Bathing/ Shower/ Hygiene May shower with protection but do not  fibrotic today. 10/08/2022: The proximal wound is healed. The more distal wound is smaller. There is some slough accumulation and a little bit of eschar  around the edges. Today, the patient pointed out a wound on her left lower abdomen that has been present since a colostomy reversal in 2018. She has not ever discussed this with the surgeon that performed her ostomy takedown and this is the first time she has mentioned it to me. The tissue was very friable and apparently her PCP just started her on Augmentin, although I do not see any obvious signs of infection. 10/16/2022: The leg wound is smaller again today and more superficial. It is much cleaner with very little slough on the surface. The old ostomy site still has hypertrophic granulation tissue present. She is awaiting approval from insurance to see Dr. Maisie Fus, the surgeon that performed her ostomy reversal. 10/22/2022: The leg wound continues to contract. There is minimal slough on the surface. Edema control is good. The old ostomy site looks about the same, but the hypertrophic granulation tissue is less prominent today. 10/29/2022: She has a new leg wound just proximal and lateral to the existing site. There is a little bit of slough on the surface. It looks as though perhaps some skin tore here. The existing leg wound is more superficial and also has a layer of slough on the surface. The old ostomy site is stable with some slough present. She still does not have an appointment with surgery. Electronic Signature(s) Signed: 10/29/2022 10:30:54 AM By: [name] MD FACS Entered By: [name] on 10/29/2022 07:30:54 [name], [name] ([id number]) [id number] Page 7 of 17 -------------------------------------------------------------------------------- Physical Exam Details Patient Name: Date of Service: [name], [name] 10/29/2022 9:30 A M Medical Record Number: [id number] Patient Account Number: [id number] Date of Birth/Sex: Treating RN: 1955-08-03 (67 y.o. F) Primary Care Provider: [name] Other Clinician: Referring Provider: Treating  Provider/Extender: Jasmine Pang in Treatment: 8 Constitutional . . . . no acute distress. Respiratory Normal work of breathing on room air. Notes 10/29/2022: She has a new leg wound just proximal and lateral to the existing site. There is a little bit of slough on the surface. It looks as though perhaps some skin tore here. The existing leg wound is more superficial and also has a layer of slough on the surface. The old ostomy site is stable with some slough present. Electronic Signature(s) Signed: 10/29/2022 10:31:50 AM By: [name] MD FACS Entered By: [name] on 10/29/2022 07:31:49 -------------------------------------------------------------------------------- Physician Orders Details Patient Name: Date of Service: [name] [name] 10/29/2022 9:30 A M Medical Record Number: [id number] Patient Account Number: [id number] Date of Birth/Sex: Treating RN: 17-Nov-1955 (67 y.o. 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 - Dr. Lady Gary 11/05/22 at 9:30am Return A Room 3 ppointment in 2 weeks. - Please ask front desk for appointments Return A Discharge From [facility] Discharge from [facility] - Please keep wearing compression stockings. Anesthetic (In clinic) Topical Lidocaine 4% applied to wound bed Bathing/ Shower/ Hygiene May shower with protection but do not  TYSHEMA, KULT ([id number]) [id number] Page 1 of 17 Visit Report for 10/29/2022 Chief Complaint Document Details Patient Name: Date of Service: [name], [name] 10/29/2022 9:30 A M Medical Record Number: [id number] Patient Account Number: [id number] Date of Birth/Sex: Treating RN: 11-12-1955 (67 y.o. F) Primary Care Provider: [name] Other Clinician: Referring Provider: Treating Provider/Extender: Jasmine Pang in Treatment: 8 Information Obtained from: Patient Chief Complaint 04/19/2021: The patient is here for ongoing follow-up regarding 2 left lower extremity wounds. 01/22/2022; patient returns to clinic with a wound on the left anterior lower leg secondary to trauma Electronic Signature(s) Signed: 10/29/2022 10:29:50 AM By: [name] MD FACS Entered By: [name] on 10/29/2022 07:29:50 -------------------------------------------------------------------------------- Debridement Details Patient Name: Date of Service: [name] [name] 10/29/2022 9:30 A M Medical Record Number: [id number] Patient Account Number: [id number] Date of Birth/Sex: Treating RN: 1955-03-27 (67 y.o. [name] Primary Care Provider: [name] Other Clinician: Referring Provider: Treating Provider/Extender: Jasmine Pang in Treatment: 8 Debridement Performed for Assessment: Wound #11 Left,Lateral Lower Leg Performed By: Physician [name], MD The following information was scribed by: Karie Schwalbe The information was scribed for: [name] Debridement Type: Debridement Level of Consciousness (Pre-procedure): Awake and Alert Pre-procedure Verification/Time Out Yes - 10:05 Taken: Start Time: 10:05 Pain Control: Lidocaine 4% T opical Solution Percent of Wound Bed Debrided: 100% T Area Debrided (cm): otal 0.39 Tissue and other material debrided: Non-Viable, Slough,  Slough Level: Non-Viable Tissue Debridement Description: Selective/Open Wound Instrument: Curette Bleeding: Minimum Hemostasis Achieved: Pressure End Time: 10:07 Procedural Pain: 0 Post Procedural Pain: 0 Response to Treatment: Procedure was tolerated well Level of Consciousness (Post- Awake and Alert procedure): Post Debridement Measurements of Total Wound Length: (cm) 1 Width: (cm) 0.5 Depth: (cm) 0.1 MERION, WISSING [name] ([id number]) [id number] Page 2 of 17 Volume: (cm) 0.039 Character of Wound/Ulcer Post Debridement: Improved Post Procedure Diagnosis Same as Pre-procedure Electronic Signature(s) Signed: 10/29/2022 11:19:39 AM By: [name] MD FACS Signed: 10/29/2022 4:36:01 PM By: Karie Schwalbe RN Entered By: Karie Schwalbe on 10/29/2022 07:28:29 -------------------------------------------------------------------------------- Debridement Details Patient Name: Date of Service: [name] 10/29/2022 9:30 A M Medical Record Number: [id number] Patient Account Number: [id number] Date of Birth/Sex: Treating RN: 1956-01-13 (67 y.o. [name] Primary Care Provider: [name] Other Clinician: Referring Provider: Treating Provider/Extender: Jasmine Pang in Treatment: 8 Debridement Performed for Assessment: Wound #9 Left,Distal,Anterior Lower Leg Performed By: Physician [name], MD The following information was scribed by: Karie Schwalbe The information was scribed for: [name] Debridement Type: Debridement Level of Consciousness (Pre-procedure): Awake and Alert Pre-procedure Verification/Time Out Yes - 10:05 Taken: Start Time: 10:05 Pain Control: Lidocaine 4% T opical Solution Percent of Wound Bed Debrided: 100% T Area Debrided (cm): otal 0.35 Tissue and other material debrided: Non-Viable, Slough, Slough Level: Non-Viable Tissue Debridement Description: Selective/Open  Wound Instrument: Curette Bleeding: Minimum Hemostasis Achieved: Pressure End Time: 10:07 Procedural Pain: 0 Post Procedural Pain: 0 Response to Treatment: Procedure was tolerated well Level of Consciousness (Post- Awake and Alert procedure): Post Debridement Measurements of Total Wound Length: (cm) 0.9 Width: (cm) 0.5 Depth: (cm) 0.2 Volume: (cm) 0.071 Character of Wound/Ulcer Post Debridement: Improved Post Procedure Diagnosis Same as Pre-procedure Electronic Signature(s) Signed: 10/29/2022 11:19:39 AM By: [name] MD FACS Signed: 10/29/2022 4:36:01 PM By: Karie Schwalbe RN Entered By: Karie Schwalbe on 10/29/2022 07:29:23 DLISA, KALDENBERG [name] ([id number]) [id number] Page 3 of 17 -------------------------------------------------------------------------------- Debridement Details Patient Name: Date of  TYSHEMA, KULT ([id number]) [id number] Page 1 of 17 Visit Report for 10/29/2022 Chief Complaint Document Details Patient Name: Date of Service: [name], [name] 10/29/2022 9:30 A M Medical Record Number: [id number] Patient Account Number: [id number] Date of Birth/Sex: Treating RN: 11-12-1955 (67 y.o. F) Primary Care Provider: [name] Other Clinician: Referring Provider: Treating Provider/Extender: Jasmine Pang in Treatment: 8 Information Obtained from: Patient Chief Complaint 04/19/2021: The patient is here for ongoing follow-up regarding 2 left lower extremity wounds. 01/22/2022; patient returns to clinic with a wound on the left anterior lower leg secondary to trauma Electronic Signature(s) Signed: 10/29/2022 10:29:50 AM By: [name] MD FACS Entered By: [name] on 10/29/2022 07:29:50 -------------------------------------------------------------------------------- Debridement Details Patient Name: Date of Service: [name] [name] 10/29/2022 9:30 A M Medical Record Number: [id number] Patient Account Number: [id number] Date of Birth/Sex: Treating RN: 1955-03-27 (67 y.o. [name] Primary Care Provider: [name] Other Clinician: Referring Provider: Treating Provider/Extender: Jasmine Pang in Treatment: 8 Debridement Performed for Assessment: Wound #11 Left,Lateral Lower Leg Performed By: Physician [name], MD The following information was scribed by: Karie Schwalbe The information was scribed for: [name] Debridement Type: Debridement Level of Consciousness (Pre-procedure): Awake and Alert Pre-procedure Verification/Time Out Yes - 10:05 Taken: Start Time: 10:05 Pain Control: Lidocaine 4% T opical Solution Percent of Wound Bed Debrided: 100% T Area Debrided (cm): otal 0.39 Tissue and other material debrided: Non-Viable, Slough,  Slough Level: Non-Viable Tissue Debridement Description: Selective/Open Wound Instrument: Curette Bleeding: Minimum Hemostasis Achieved: Pressure End Time: 10:07 Procedural Pain: 0 Post Procedural Pain: 0 Response to Treatment: Procedure was tolerated well Level of Consciousness (Post- Awake and Alert procedure): Post Debridement Measurements of Total Wound Length: (cm) 1 Width: (cm) 0.5 Depth: (cm) 0.1 MERION, WISSING [name] ([id number]) [id number] Page 2 of 17 Volume: (cm) 0.039 Character of Wound/Ulcer Post Debridement: Improved Post Procedure Diagnosis Same as Pre-procedure Electronic Signature(s) Signed: 10/29/2022 11:19:39 AM By: [name] MD FACS Signed: 10/29/2022 4:36:01 PM By: Karie Schwalbe RN Entered By: Karie Schwalbe on 10/29/2022 07:28:29 -------------------------------------------------------------------------------- Debridement Details Patient Name: Date of Service: [name] 10/29/2022 9:30 A M Medical Record Number: [id number] Patient Account Number: [id number] Date of Birth/Sex: Treating RN: 1956-01-13 (67 y.o. [name] Primary Care Provider: [name] Other Clinician: Referring Provider: Treating Provider/Extender: Jasmine Pang in Treatment: 8 Debridement Performed for Assessment: Wound #9 Left,Distal,Anterior Lower Leg Performed By: Physician [name], MD The following information was scribed by: Karie Schwalbe The information was scribed for: [name] Debridement Type: Debridement Level of Consciousness (Pre-procedure): Awake and Alert Pre-procedure Verification/Time Out Yes - 10:05 Taken: Start Time: 10:05 Pain Control: Lidocaine 4% T opical Solution Percent of Wound Bed Debrided: 100% T Area Debrided (cm): otal 0.35 Tissue and other material debrided: Non-Viable, Slough, Slough Level: Non-Viable Tissue Debridement Description: Selective/Open  Wound Instrument: Curette Bleeding: Minimum Hemostasis Achieved: Pressure End Time: 10:07 Procedural Pain: 0 Post Procedural Pain: 0 Response to Treatment: Procedure was tolerated well Level of Consciousness (Post- Awake and Alert procedure): Post Debridement Measurements of Total Wound Length: (cm) 0.9 Width: (cm) 0.5 Depth: (cm) 0.2 Volume: (cm) 0.071 Character of Wound/Ulcer Post Debridement: Improved Post Procedure Diagnosis Same as Pre-procedure Electronic Signature(s) Signed: 10/29/2022 11:19:39 AM By: [name] MD FACS Signed: 10/29/2022 4:36:01 PM By: Karie Schwalbe RN Entered By: Karie Schwalbe on 10/29/2022 07:29:23 DLISA, KALDENBERG [name] ([id number]) [id number] Page 3 of 17 -------------------------------------------------------------------------------- Debridement Details Patient Name: Date of  fibrotic today. 10/08/2022: The proximal wound is healed. The more distal wound is smaller. There is some slough accumulation and a little bit of eschar  around the edges. Today, the patient pointed out a wound on her left lower abdomen that has been present since a colostomy reversal in 2018. She has not ever discussed this with the surgeon that performed her ostomy takedown and this is the first time she has mentioned it to me. The tissue was very friable and apparently her PCP just started her on Augmentin, although I do not see any obvious signs of infection. 10/16/2022: The leg wound is smaller again today and more superficial. It is much cleaner with very little slough on the surface. The old ostomy site still has hypertrophic granulation tissue present. She is awaiting approval from insurance to see Dr. Maisie Fus, the surgeon that performed her ostomy reversal. 10/22/2022: The leg wound continues to contract. There is minimal slough on the surface. Edema control is good. The old ostomy site looks about the same, but the hypertrophic granulation tissue is less prominent today. 10/29/2022: She has a new leg wound just proximal and lateral to the existing site. There is a little bit of slough on the surface. It looks as though perhaps some skin tore here. The existing leg wound is more superficial and also has a layer of slough on the surface. The old ostomy site is stable with some slough present. She still does not have an appointment with surgery. Electronic Signature(s) Signed: 10/29/2022 10:30:54 AM By: [name] MD FACS Entered By: [name] on 10/29/2022 07:30:54 [name], [name] ([id number]) [id number] Page 7 of 17 -------------------------------------------------------------------------------- Physical Exam Details Patient Name: Date of Service: [name], [name] 10/29/2022 9:30 A M Medical Record Number: [id number] Patient Account Number: [id number] Date of Birth/Sex: Treating RN: 1955-08-03 (67 y.o. F) Primary Care Provider: [name] Other Clinician: Referring Provider: Treating  Provider/Extender: Jasmine Pang in Treatment: 8 Constitutional . . . . no acute distress. Respiratory Normal work of breathing on room air. Notes 10/29/2022: She has a new leg wound just proximal and lateral to the existing site. There is a little bit of slough on the surface. It looks as though perhaps some skin tore here. The existing leg wound is more superficial and also has a layer of slough on the surface. The old ostomy site is stable with some slough present. Electronic Signature(s) Signed: 10/29/2022 10:31:50 AM By: [name] MD FACS Entered By: [name] on 10/29/2022 07:31:49 -------------------------------------------------------------------------------- Physician Orders Details Patient Name: Date of Service: [name] [name] 10/29/2022 9:30 A M Medical Record Number: [id number] Patient Account Number: [id number] Date of Birth/Sex: Treating RN: 17-Nov-1955 (67 y.o. 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 - Dr. Lady Gary 11/05/22 at 9:30am Return A Room 3 ppointment in 2 weeks. - Please ask front desk for appointments Return A Discharge From [facility] - Please keep wearing compression stockings. Anesthetic (In clinic) Topical Lidocaine 4% applied to wound bed Bathing/ Shower/ Hygiene May shower with protection but do not  TYSHEMA, KULT ([id number]) [id number] Page 1 of 17 Visit Report for 10/29/2022 Chief Complaint Document Details Patient Name: Date of Service: [name], [name] 10/29/2022 9:30 A M Medical Record Number: [id number] Patient Account Number: [id number] Date of Birth/Sex: Treating RN: 11-12-1955 (67 y.o. F) Primary Care Provider: [name] Other Clinician: Referring Provider: Treating Provider/Extender: Jasmine Pang in Treatment: 8 Information Obtained from: Patient Chief Complaint 04/19/2021: The patient is here for ongoing follow-up regarding 2 left lower extremity wounds. 01/22/2022; patient returns to clinic with a wound on the left anterior lower leg secondary to trauma Electronic Signature(s) Signed: 10/29/2022 10:29:50 AM By: [name] MD FACS Entered By: [name] on 10/29/2022 07:29:50 -------------------------------------------------------------------------------- Debridement Details Patient Name: Date of Service: [name] [name] 10/29/2022 9:30 A M Medical Record Number: [id number] Patient Account Number: [id number] Date of Birth/Sex: Treating RN: 1955-03-27 (67 y.o. [name] Primary Care Provider: [name] Other Clinician: Referring Provider: Treating Provider/Extender: Jasmine Pang in Treatment: 8 Debridement Performed for Assessment: Wound #11 Left,Lateral Lower Leg Performed By: Physician [name], MD The following information was scribed by: Karie Schwalbe The information was scribed for: [name] Debridement Type: Debridement Level of Consciousness (Pre-procedure): Awake and Alert Pre-procedure Verification/Time Out Yes - 10:05 Taken: Start Time: 10:05 Pain Control: Lidocaine 4% T opical Solution Percent of Wound Bed Debrided: 100% T Area Debrided (cm): otal 0.39 Tissue and other material debrided: Non-Viable, Slough,  Slough Level: Non-Viable Tissue Debridement Description: Selective/Open Wound Instrument: Curette Bleeding: Minimum Hemostasis Achieved: Pressure End Time: 10:07 Procedural Pain: 0 Post Procedural Pain: 0 Response to Treatment: Procedure was tolerated well Level of Consciousness (Post- Awake and Alert procedure): Post Debridement Measurements of Total Wound Length: (cm) 1 Width: (cm) 0.5 Depth: (cm) 0.1 MERION, WISSING [name] ([id number]) [id number] Page 2 of 17 Volume: (cm) 0.039 Character of Wound/Ulcer Post Debridement: Improved Post Procedure Diagnosis Same as Pre-procedure Electronic Signature(s) Signed: 10/29/2022 11:19:39 AM By: [name] MD FACS Signed: 10/29/2022 4:36:01 PM By: Karie Schwalbe RN Entered By: Karie Schwalbe on 10/29/2022 07:28:29 -------------------------------------------------------------------------------- Debridement Details Patient Name: Date of Service: [name] 10/29/2022 9:30 A M Medical Record Number: [id number] Patient Account Number: [id number] Date of Birth/Sex: Treating RN: 1956-01-13 (67 y.o. [name] Primary Care Provider: [name] Other Clinician: Referring Provider: Treating Provider/Extender: Jasmine Pang in Treatment: 8 Debridement Performed for Assessment: Wound #9 Left,Distal,Anterior Lower Leg Performed By: Physician [name], MD The following information was scribed by: Karie Schwalbe The information was scribed for: [name] Debridement Type: Debridement Level of Consciousness (Pre-procedure): Awake and Alert Pre-procedure Verification/Time Out Yes - 10:05 Taken: Start Time: 10:05 Pain Control: Lidocaine 4% T opical Solution Percent of Wound Bed Debrided: 100% T Area Debrided (cm): otal 0.35 Tissue and other material debrided: Non-Viable, Slough, Slough Level: Non-Viable Tissue Debridement Description: Selective/Open  Wound Instrument: Curette Bleeding: Minimum Hemostasis Achieved: Pressure End Time: 10:07 Procedural Pain: 0 Post Procedural Pain: 0 Response to Treatment: Procedure was tolerated well Level of Consciousness (Post- Awake and Alert procedure): Post Debridement Measurements of Total Wound Length: (cm) 0.9 Width: (cm) 0.5 Depth: (cm) 0.2 Volume: (cm) 0.071 Character of Wound/Ulcer Post Debridement: Improved Post Procedure Diagnosis Same as Pre-procedure Electronic Signature(s) Signed: 10/29/2022 11:19:39 AM By: [name] MD FACS Signed: 10/29/2022 4:36:01 PM By: Karie Schwalbe RN Entered By: Karie Schwalbe on 10/29/2022 07:29:23 DLISA, KALDENBERG [name] ([id number]) [id number] Page 3 of 17 -------------------------------------------------------------------------------- Debridement Details Patient Name: Date of  ulcer of unspecified part of left lower leg with other 08/31/2022 No Yes specified severity 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 10/29/2022 9:30 A M Medical Record Number: [id number] Patient Account Number: [id number] Date of Birth/Sex: Treating RN: 09-28-1955 (67 y.o. F) Primary Care Provider: [name] Other Clinician: Referring Provider: Treating Provider/Extender: Jasmine Pang in Treatment: 8 Subjective Chief Complaint Information obtained from Patient 04/19/2021: The patient is here for ongoing follow-up regarding 2 left lower extremity wounds. 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 She tells [name] that she fell on the gravel driveway in July. She developed injuries on her distal lower leg which have not healed. She saw her primary physician on 11/15/2017 who noted her left shin injuries. Gave her antibiotics. At that point the wounds were almost circumferential however most were less than 1.5 cm. Weeping edema fluid was noted. She was referred here for evaluation. The  patient has a history of chronic lower extremity edema. 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 She has had prior history with leg weeping fluid. She does not wear compression stockings. She is not doing anything specific to these wound areas. 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 She also has lymphedema. Put her in 3 layer compression and silver alginate last week. She has 3 small wounds with depth just lateral to the tibia. More problematically than ALMADELIA, [name] ([id number]) [id number] Page 10 of 17 this she has numerous shallow areas some of which are almost canal like in shape with tightly adherent painful debris. It would be very difficult and time- consuming to go through this and attempt to individually debride all these areas.. I changed her to collagen today to see if that would help with any of the surface debris on some of these wounds. Otherwise we will not be able to put this in compression we have to have someone change the dressing. The patient is not eligible for home health 12/25/17 on evaluation today patient actually appears to be doing rather well in regard to the ulcer on her lower extremity. Fortunately there does not appear to be evidence of infection at this time. She has been tolerating the dressing changes without complication. This includes the compression wrap. The only issue she had was that the wrap was initially placed over her bunion region which actually calls her some discomfort and pain. Other than that things seem to be going rather  well. 01/01/2018 Seen today for follow-up and management of left lower extremity wound and lymphedema. T oday she presents with a new wound towards to the left lateral LE. 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 10/29/2022 9:30 A M Medical Record Number: [id number] Patient Account Number: [id number] Date of Birth/Sex: Treating RN: 09-28-1955 (67 y.o. F) Primary Care Provider: [name] Other Clinician: Referring Provider: Treating Provider/Extender: Jasmine Pang in Treatment: 8 Subjective Chief Complaint Information obtained from Patient 04/19/2021: The patient is here for ongoing follow-up regarding 2 left lower extremity wounds. 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 She tells [name] that she fell on the gravel driveway in July. She developed injuries on her distal lower leg which have not healed. She saw her primary physician on 11/15/2017 who noted her left shin injuries. Gave her antibiotics. At that point the wounds were almost circumferential however most were less than 1.5 cm. Weeping edema fluid was noted. She was referred here for evaluation. The  patient has a history of chronic lower extremity edema. 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 She has had prior history with leg weeping fluid. She does not wear compression stockings. She is not doing anything specific to these wound areas. 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 She also has lymphedema. Put her in 3 layer compression and silver alginate last week. She has 3 small wounds with depth just lateral to the tibia. More problematically than ALMADELIA, [name] ([id number]) [id number] Page 10 of 17 this she has numerous shallow areas some of which are almost canal like in shape with tightly adherent painful debris. It would be very difficult and time- consuming to go through this and attempt to individually debride all these areas.. I changed her to collagen today to see if that would help with any of the surface debris on some of these wounds. Otherwise we will not be able to put this in compression we have to have someone change the dressing. The patient is not eligible for home health 12/25/17 on evaluation today patient actually appears to be doing rather well in regard to the ulcer on her lower extremity. Fortunately there does not appear to be evidence of infection at this time. She has been tolerating the dressing changes without complication. This includes the compression wrap. The only issue she had was that the wrap was initially placed over her bunion region which actually calls her some discomfort and pain. Other than that things seem to be going rather  well. 01/01/2018 Seen today for follow-up and management of left lower extremity wound and lymphedema. T oday she presents with a new wound towards to the left lateral LE. Recently treated with a 7 day course  fibrotic today. 10/08/2022: The proximal wound is healed. The more distal wound is smaller. There is some slough accumulation and a little bit of eschar  around the edges. Today, the patient pointed out a wound on her left lower abdomen that has been present since a colostomy reversal in 2018. She has not ever discussed this with the surgeon that performed her ostomy takedown and this is the first time she has mentioned it to me. The tissue was very friable and apparently her PCP just started her on Augmentin, although I do not see any obvious signs of infection. 10/16/2022: The leg wound is smaller again today and more superficial. It is much cleaner with very little slough on the surface. The old ostomy site still has hypertrophic granulation tissue present. She is awaiting approval from insurance to see Dr. Maisie Fus, the surgeon that performed her ostomy reversal. 10/22/2022: The leg wound continues to contract. There is minimal slough on the surface. Edema control is good. The old ostomy site looks about the same, but the hypertrophic granulation tissue is less prominent today. 10/29/2022: She has a new leg wound just proximal and lateral to the existing site. There is a little bit of slough on the surface. It looks as though perhaps some skin tore here. The existing leg wound is more superficial and also has a layer of slough on the surface. The old ostomy site is stable with some slough present. She still does not have an appointment with surgery. Electronic Signature(s) Signed: 10/29/2022 10:30:54 AM By: [name] MD FACS Entered By: [name] on 10/29/2022 07:30:54 [name], [name] ([id number]) [id number] Page 7 of 17 -------------------------------------------------------------------------------- Physical Exam Details Patient Name: Date of Service: [name], [name] 10/29/2022 9:30 A M Medical Record Number: [id number] Patient Account Number: [id number] Date of Birth/Sex: Treating RN: 1955-08-03 (67 y.o. F) Primary Care Provider: [name] Other Clinician: Referring Provider: Treating  Provider/Extender: Jasmine Pang in Treatment: 8 Constitutional . . . . no acute distress. Respiratory Normal work of breathing on room air. Notes 10/29/2022: She has a new leg wound just proximal and lateral to the existing site. There is a little bit of slough on the surface. It looks as though perhaps some skin tore here. The existing leg wound is more superficial and also has a layer of slough on the surface. The old ostomy site is stable with some slough present. Electronic Signature(s) Signed: 10/29/2022 10:31:50 AM By: [name] MD FACS Entered By: [name] on 10/29/2022 07:31:49 -------------------------------------------------------------------------------- Physician Orders Details Patient Name: Date of Service: [name] [name] 10/29/2022 9:30 A M Medical Record Number: [id number] Patient Account Number: [id number] Date of Birth/Sex: Treating RN: 17-Nov-1955 (67 y.o. 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 - Dr. Lady Gary 11/05/22 at 9:30am Return A Room 3 ppointment in 2 weeks. - Please ask front desk for appointments Return A Discharge From [facility] Services Discharge from [facility] - Please keep wearing compression stockings. Anesthetic (In clinic) Topical Lidocaine 4% applied to wound bed Bathing/ Shower/ Hygiene May shower with protection but do not  TYSHEMA, KULT ([id number]) [id number] Page 1 of 17 Visit Report for 10/29/2022 Chief Complaint Document Details Patient Name: Date of Service: [name], [name] 10/29/2022 9:30 A M Medical Record Number: [id number] Patient Account Number: [id number] Date of Birth/Sex: Treating RN: 11-12-1955 (67 y.o. F) Primary Care Provider: [name] Other Clinician: Referring Provider: Treating Provider/Extender: Jasmine Pang in Treatment: 8 Information Obtained from: Patient Chief Complaint 04/19/2021: The patient is here for ongoing follow-up regarding 2 left lower extremity wounds. 01/22/2022; patient returns to clinic with a wound on the left anterior lower leg secondary to trauma Electronic Signature(s) Signed: 10/29/2022 10:29:50 AM By: [name] MD FACS Entered By: [name] on 10/29/2022 07:29:50 -------------------------------------------------------------------------------- Debridement Details Patient Name: Date of Service: [name] [name] 10/29/2022 9:30 A M Medical Record Number: [id number] Patient Account Number: [id number] Date of Birth/Sex: Treating RN: 1955-03-27 (67 y.o. [name] Primary Care Provider: [name] Other Clinician: Referring Provider: Treating Provider/Extender: Jasmine Pang in Treatment: 8 Debridement Performed for Assessment: Wound #11 Left,Lateral Lower Leg Performed By: Physician [name], MD The following information was scribed by: Karie Schwalbe The information was scribed for: [name] Debridement Type: Debridement Level of Consciousness (Pre-procedure): Awake and Alert Pre-procedure Verification/Time Out Yes - 10:05 Taken: Start Time: 10:05 Pain Control: Lidocaine 4% T opical Solution Percent of Wound Bed Debrided: 100% T Area Debrided (cm): otal 0.39 Tissue and other material debrided: Non-Viable, Slough,  Slough Level: Non-Viable Tissue Debridement Description: Selective/Open Wound Instrument: Curette Bleeding: Minimum Hemostasis Achieved: Pressure End Time: 10:07 Procedural Pain: 0 Post Procedural Pain: 0 Response to Treatment: Procedure was tolerated well Level of Consciousness (Post- Awake and Alert procedure): Post Debridement Measurements of Total Wound Length: (cm) 1 Width: (cm) 0.5 Depth: (cm) 0.1 MERION, WISSING [name] ([id number]) [id number] Page 2 of 17 Volume: (cm) 0.039 Character of Wound/Ulcer Post Debridement: Improved Post Procedure Diagnosis Same as Pre-procedure Electronic Signature(s) Signed: 10/29/2022 11:19:39 AM By: [name] MD FACS Signed: 10/29/2022 4:36:01 PM By: Karie Schwalbe RN Entered By: Karie Schwalbe on 10/29/2022 07:28:29 -------------------------------------------------------------------------------- Debridement Details Patient Name: Date of Service: [name] 10/29/2022 9:30 A M Medical Record Number: [id number] Patient Account Number: [id number] Date of Birth/Sex: Treating RN: 1956-01-13 (67 y.o. [name] Primary Care Provider: [name] Other Clinician: Referring Provider: Treating Provider/Extender: Jasmine Pang in Treatment: 8 Debridement Performed for Assessment: Wound #9 Left,Distal,Anterior Lower Leg Performed By: Physician [name], MD The following information was scribed by: Karie Schwalbe The information was scribed for: [name] Debridement Type: Debridement Level of Consciousness (Pre-procedure): Awake and Alert Pre-procedure Verification/Time Out Yes - 10:05 Taken: Start Time: 10:05 Pain Control: Lidocaine 4% T opical Solution Percent of Wound Bed Debrided: 100% T Area Debrided (cm): otal 0.35 Tissue and other material debrided: Non-Viable, Slough, Slough Level: Non-Viable Tissue Debridement Description: Selective/Open  Wound Instrument: Curette Bleeding: Minimum Hemostasis Achieved: Pressure End Time: 10:07 Procedural Pain: 0 Post Procedural Pain: 0 Response to Treatment: Procedure was tolerated well Level of Consciousness (Post- Awake and Alert procedure): Post Debridement Measurements of Total Wound Length: (cm) 0.9 Width: (cm) 0.5 Depth: (cm) 0.2 Volume: (cm) 0.071 Character of Wound/Ulcer Post Debridement: Improved Post Procedure Diagnosis Same as Pre-procedure Electronic Signature(s) Signed: 10/29/2022 11:19:39 AM By: [name] MD FACS Signed: 10/29/2022 4:36:01 PM By: Karie Schwalbe RN Entered By: Karie Schwalbe on 10/29/2022 07:29:23 DLISA, KALDENBERG [name] ([id number]) [id number] Page 3 of 17 -------------------------------------------------------------------------------- Debridement Details Patient Name: Date of  fibrotic today. 10/08/2022: The proximal wound is healed. The more distal wound is smaller. There is some slough accumulation and a little bit of eschar  around the edges. Today, the patient pointed out a wound on her left lower abdomen that has been present since a colostomy reversal in 2018. She has not ever discussed this with the surgeon that performed her ostomy takedown and this is the first time she has mentioned it to me. The tissue was very friable and apparently her PCP just started her on Augmentin, although I do not see any obvious signs of infection. 10/16/2022: The leg wound is smaller again today and more superficial. It is much cleaner with very little slough on the surface. The old ostomy site still has hypertrophic granulation tissue present. She is awaiting approval from insurance to see Dr. Maisie Fus, the surgeon that performed her ostomy reversal. 10/22/2022: The leg wound continues to contract. There is minimal slough on the surface. Edema control is good. The old ostomy site looks about the same, but the hypertrophic granulation tissue is less prominent today. 10/29/2022: She has a new leg wound just proximal and lateral to the existing site. There is a little bit of slough on the surface. It looks as though perhaps some skin tore here. The existing leg wound is more superficial and also has a layer of slough on the surface. The old ostomy site is stable with some slough present. She still does not have an appointment with surgery. Electronic Signature(s) Signed: 10/29/2022 10:30:54 AM By: [name] MD FACS Entered By: [name] on 10/29/2022 07:30:54 [name], [name] ([id number]) [id number] Page 7 of 17 -------------------------------------------------------------------------------- Physical Exam Details Patient Name: Date of Service: [name], [name] 10/29/2022 9:30 A M Medical Record Number: [id number] Patient Account Number: [id number] Date of Birth/Sex: Treating RN: 1955-08-03 (67 y.o. F) Primary Care Provider: [name] Other Clinician: Referring Provider: Treating  Provider/Extender: Jasmine Pang in Treatment: 8 Constitutional . . . . no acute distress. Respiratory Normal work of breathing on room air. Notes 10/29/2022: She has a new leg wound just proximal and lateral to the existing site. There is a little bit of slough on the surface. It looks as though perhaps some skin tore here. The existing leg wound is more superficial and also has a layer of slough on the surface. The old ostomy site is stable with some slough present. Electronic Signature(s) Signed: 10/29/2022 10:31:50 AM By: [name] MD FACS Entered By: [name] on 10/29/2022 07:31:49 -------------------------------------------------------------------------------- Physician Orders Details Patient Name: Date of Service: [name] [name] 10/29/2022 9:30 A M Medical Record Number: [id number] Patient Account Number: [id number] Date of Birth/Sex: Treating RN: 17-Nov-1955 (67 y.o. 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 - Dr. Lady Gary 11/05/22 at 9:30am Return A Room 3 ppointment in 2 weeks. - Please ask front desk for appointments Return A Discharge From [facility] - Please keep wearing compression stockings. Anesthetic (In clinic) Topical Lidocaine 4% applied to wound bed Bathing/ Shower/ Hygiene May shower with protection but do not  TYSHEMA, KULT ([id number]) [id number] Page 1 of 17 Visit Report for 10/29/2022 Chief Complaint Document Details Patient Name: Date of Service: [name], [name] 10/29/2022 9:30 A M Medical Record Number: [id number] Patient Account Number: [id number] Date of Birth/Sex: Treating RN: 11-12-1955 (67 y.o. F) Primary Care Provider: [name] Other Clinician: Referring Provider: Treating Provider/Extender: Jasmine Pang in Treatment: 8 Information Obtained from: Patient Chief Complaint 04/19/2021: The patient is here for ongoing follow-up regarding 2 left lower extremity wounds. 01/22/2022; patient returns to clinic with a wound on the left anterior lower leg secondary to trauma Electronic Signature(s) Signed: 10/29/2022 10:29:50 AM By: [name] MD FACS Entered By: [name] on 10/29/2022 07:29:50 -------------------------------------------------------------------------------- Debridement Details Patient Name: Date of Service: [name] [name] 10/29/2022 9:30 A M Medical Record Number: [id number] Patient Account Number: [id number] Date of Birth/Sex: Treating RN: 1955-03-27 (67 y.o. [name] Primary Care Provider: [name] Other Clinician: Referring Provider: Treating Provider/Extender: Jasmine Pang in Treatment: 8 Debridement Performed for Assessment: Wound #11 Left,Lateral Lower Leg Performed By: Physician [name], MD The following information was scribed by: Karie Schwalbe The information was scribed for: [name] Debridement Type: Debridement Level of Consciousness (Pre-procedure): Awake and Alert Pre-procedure Verification/Time Out Yes - 10:05 Taken: Start Time: 10:05 Pain Control: Lidocaine 4% T opical Solution Percent of Wound Bed Debrided: 100% T Area Debrided (cm): otal 0.39 Tissue and other material debrided: Non-Viable, Slough,  Slough Level: Non-Viable Tissue Debridement Description: Selective/Open Wound Instrument: Curette Bleeding: Minimum Hemostasis Achieved: Pressure End Time: 10:07 Procedural Pain: 0 Post Procedural Pain: 0 Response to Treatment: Procedure was tolerated well Level of Consciousness (Post- Awake and Alert procedure): Post Debridement Measurements of Total Wound Length: (cm) 1 Width: (cm) 0.5 Depth: (cm) 0.1 MERION, WISSING [name] ([id number]) [id number] Page 2 of 17 Volume: (cm) 0.039 Character of Wound/Ulcer Post Debridement: Improved Post Procedure Diagnosis Same as Pre-procedure Electronic Signature(s) Signed: 10/29/2022 11:19:39 AM By: [name] MD FACS Signed: 10/29/2022 4:36:01 PM By: Karie Schwalbe RN Entered By: Karie Schwalbe on 10/29/2022 07:28:29 -------------------------------------------------------------------------------- Debridement Details Patient Name: Date of Service: [name] 10/29/2022 9:30 A M Medical Record Number: [id number] Patient Account Number: [id number] Date of Birth/Sex: Treating RN: 1956-01-13 (67 y.o. [name] Primary Care Provider: [name] Other Clinician: Referring Provider: Treating Provider/Extender: Jasmine Pang in Treatment: 8 Debridement Performed for Assessment: Wound #9 Left,Distal,Anterior Lower Leg Performed By: Physician [name], MD The following information was scribed by: Karie Schwalbe The information was scribed for: [name] Debridement Type: Debridement Level of Consciousness (Pre-procedure): Awake and Alert Pre-procedure Verification/Time Out Yes - 10:05 Taken: Start Time: 10:05 Pain Control: Lidocaine 4% T opical Solution Percent of Wound Bed Debrided: 100% T Area Debrided (cm): otal 0.35 Tissue and other material debrided: Non-Viable, Slough, Slough Level: Non-Viable Tissue Debridement Description: Selective/Open  Wound Instrument: Curette Bleeding: Minimum Hemostasis Achieved: Pressure End Time: 10:07 Procedural Pain: 0 Post Procedural Pain: 0 Response to Treatment: Procedure was tolerated well Level of Consciousness (Post- Awake and Alert procedure): Post Debridement Measurements of Total Wound Length: (cm) 0.9 Width: (cm) 0.5 Depth: (cm) 0.2 Volume: (cm) 0.071 Character of Wound/Ulcer Post Debridement: Improved Post Procedure Diagnosis Same as Pre-procedure Electronic Signature(s) Signed: 10/29/2022 11:19:39 AM By: [name] MD FACS Signed: 10/29/2022 4:36:01 PM By: Karie Schwalbe RN Entered By: Karie Schwalbe on 10/29/2022 07:29:23 DLISA, KALDENBERG [name] ([id number]) [id number] Page 3 of 17 -------------------------------------------------------------------------------- Debridement Details Patient Name: Date of  TYSHEMA, KULT ([id number]) [id number] Page 1 of 17 Visit Report for 10/29/2022 Chief Complaint Document Details Patient Name: Date of Service: [name], [name] 10/29/2022 9:30 A M Medical Record Number: [id number] Patient Account Number: [id number] Date of Birth/Sex: Treating RN: 11-12-1955 (67 y.o. F) Primary Care Provider: [name] Other Clinician: Referring Provider: Treating Provider/Extender: Jasmine Pang in Treatment: 8 Information Obtained from: Patient Chief Complaint 04/19/2021: The patient is here for ongoing follow-up regarding 2 left lower extremity wounds. 01/22/2022; patient returns to clinic with a wound on the left anterior lower leg secondary to trauma Electronic Signature(s) Signed: 10/29/2022 10:29:50 AM By: [name] MD FACS Entered By: [name] on 10/29/2022 07:29:50 -------------------------------------------------------------------------------- Debridement Details Patient Name: Date of Service: [name] [name] 10/29/2022 9:30 A M Medical Record Number: [id number] Patient Account Number: [id number] Date of Birth/Sex: Treating RN: 1955-03-27 (67 y.o. [name] Primary Care Provider: [name] Other Clinician: Referring Provider: Treating Provider/Extender: Jasmine Pang in Treatment: 8 Debridement Performed for Assessment: Wound #11 Left,Lateral Lower Leg Performed By: Physician [name], MD The following information was scribed by: Karie Schwalbe The information was scribed for: [name] Debridement Type: Debridement Level of Consciousness (Pre-procedure): Awake and Alert Pre-procedure Verification/Time Out Yes - 10:05 Taken: Start Time: 10:05 Pain Control: Lidocaine 4% T opical Solution Percent of Wound Bed Debrided: 100% T Area Debrided (cm): otal 0.39 Tissue and other material debrided: Non-Viable, Slough,  Slough Level: Non-Viable Tissue Debridement Description: Selective/Open Wound Instrument: Curette Bleeding: Minimum Hemostasis Achieved: Pressure End Time: 10:07 Procedural Pain: 0 Post Procedural Pain: 0 Response to Treatment: Procedure was tolerated well Level of Consciousness (Post- Awake and Alert procedure): Post Debridement Measurements of Total Wound Length: (cm) 1 Width: (cm) 0.5 Depth: (cm) 0.1 MERION, WISSING [name] ([id number]) [id number] Page 2 of 17 Volume: (cm) 0.039 Character of Wound/Ulcer Post Debridement: Improved Post Procedure Diagnosis Same as Pre-procedure Electronic Signature(s) Signed: 10/29/2022 11:19:39 AM By: [name] MD FACS Signed: 10/29/2022 4:36:01 PM By: Karie Schwalbe RN Entered By: Karie Schwalbe on 10/29/2022 07:28:29 -------------------------------------------------------------------------------- Debridement Details Patient Name: Date of Service: [name] 10/29/2022 9:30 A M Medical Record Number: [id number] Patient Account Number: [id number] Date of Birth/Sex: Treating RN: 1956-01-13 (67 y.o. [name] Primary Care Provider: [name] Other Clinician: Referring Provider: Treating Provider/Extender: Jasmine Pang in Treatment: 8 Debridement Performed for Assessment: Wound #9 Left,Distal,Anterior Lower Leg Performed By: Physician [name], MD The following information was scribed by: Karie Schwalbe The information was scribed for: [name] Debridement Type: Debridement Level of Consciousness (Pre-procedure): Awake and Alert Pre-procedure Verification/Time Out Yes - 10:05 Taken: Start Time: 10:05 Pain Control: Lidocaine 4% T opical Solution Percent of Wound Bed Debrided: 100% T Area Debrided (cm): otal 0.35 Tissue and other material debrided: Non-Viable, Slough, Slough Level: Non-Viable Tissue Debridement Description: Selective/Open  Wound Instrument: Curette Bleeding: Minimum Hemostasis Achieved: Pressure End Time: 10:07 Procedural Pain: 0 Post Procedural Pain: 0 Response to Treatment: Procedure was tolerated well Level of Consciousness (Post- Awake and Alert procedure): Post Debridement Measurements of Total Wound Length: (cm) 0.9 Width: (cm) 0.5 Depth: (cm) 0.2 Volume: (cm) 0.071 Character of Wound/Ulcer Post Debridement: Improved Post Procedure Diagnosis Same as Pre-procedure Electronic Signature(s) Signed: 10/29/2022 11:19:39 AM By: [name] MD FACS Signed: 10/29/2022 4:36:01 PM By: Karie Schwalbe RN Entered By: Karie Schwalbe on 10/29/2022 07:29:23 DLISA, KALDENBERG [name] ([id number]) [id number] Page 3 of 17 -------------------------------------------------------------------------------- Debridement Details Patient Name: Date of  TYSHEMA, KULT ([id number]) [id number] Page 1 of 17 Visit Report for 10/29/2022 Chief Complaint Document Details Patient Name: Date of Service: [name], [name] 10/29/2022 9:30 A M Medical Record Number: [id number] Patient Account Number: [id number] Date of Birth/Sex: Treating RN: 11-12-1955 (67 y.o. F) Primary Care Provider: [name] Other Clinician: Referring Provider: Treating Provider/Extender: Jasmine Pang in Treatment: 8 Information Obtained from: Patient Chief Complaint 04/19/2021: The patient is here for ongoing follow-up regarding 2 left lower extremity wounds. 01/22/2022; patient returns to clinic with a wound on the left anterior lower leg secondary to trauma Electronic Signature(s) Signed: 10/29/2022 10:29:50 AM By: [name] MD FACS Entered By: [name] on 10/29/2022 07:29:50 -------------------------------------------------------------------------------- Debridement Details Patient Name: Date of Service: [name] [name] 10/29/2022 9:30 A M Medical Record Number: [id number] Patient Account Number: [id number] Date of Birth/Sex: Treating RN: 1955-03-27 (67 y.o. [name] Primary Care Provider: [name] Other Clinician: Referring Provider: Treating Provider/Extender: Jasmine Pang in Treatment: 8 Debridement Performed for Assessment: Wound #11 Left,Lateral Lower Leg Performed By: Physician [name], MD The following information was scribed by: Karie Schwalbe The information was scribed for: [name] Debridement Type: Debridement Level of Consciousness (Pre-procedure): Awake and Alert Pre-procedure Verification/Time Out Yes - 10:05 Taken: Start Time: 10:05 Pain Control: Lidocaine 4% T opical Solution Percent of Wound Bed Debrided: 100% T Area Debrided (cm): otal 0.39 Tissue and other material debrided: Non-Viable, Slough,  Slough Level: Non-Viable Tissue Debridement Description: Selective/Open Wound Instrument: Curette Bleeding: Minimum Hemostasis Achieved: Pressure End Time: 10:07 Procedural Pain: 0 Post Procedural Pain: 0 Response to Treatment: Procedure was tolerated well Level of Consciousness (Post- Awake and Alert procedure): Post Debridement Measurements of Total Wound Length: (cm) 1 Width: (cm) 0.5 Depth: (cm) 0.1 MERION, WISSING [name] ([id number]) [id number] Page 2 of 17 Volume: (cm) 0.039 Character of Wound/Ulcer Post Debridement: Improved Post Procedure Diagnosis Same as Pre-procedure Electronic Signature(s) Signed: 10/29/2022 11:19:39 AM By: [name] MD FACS Signed: 10/29/2022 4:36:01 PM By: Karie Schwalbe RN Entered By: Karie Schwalbe on 10/29/2022 07:28:29 -------------------------------------------------------------------------------- Debridement Details Patient Name: Date of Service: [name] 10/29/2022 9:30 A M Medical Record Number: [id number] Patient Account Number: [id number] Date of Birth/Sex: Treating RN: 1956-01-13 (67 y.o. [name] Primary Care Provider: [name] Other Clinician: Referring Provider: Treating Provider/Extender: Jasmine Pang in Treatment: 8 Debridement Performed for Assessment: Wound #9 Left,Distal,Anterior Lower Leg Performed By: Physician [name], MD The following information was scribed by: Karie Schwalbe The information was scribed for: [name] Debridement Type: Debridement Level of Consciousness (Pre-procedure): Awake and Alert Pre-procedure Verification/Time Out Yes - 10:05 Taken: Start Time: 10:05 Pain Control: Lidocaine 4% T opical Solution Percent of Wound Bed Debrided: 100% T Area Debrided (cm): otal 0.35 Tissue and other material debrided: Non-Viable, Slough, Slough Level: Non-Viable Tissue Debridement Description: Selective/Open  Wound Instrument: Curette Bleeding: Minimum Hemostasis Achieved: Pressure End Time: 10:07 Procedural Pain: 0 Post Procedural Pain: 0 Response to Treatment: Procedure was tolerated well Level of Consciousness (Post- Awake and Alert procedure): Post Debridement Measurements of Total Wound Length: (cm) 0.9 Width: (cm) 0.5 Depth: (cm) 0.2 Volume: (cm) 0.071 Character of Wound/Ulcer Post Debridement: Improved Post Procedure Diagnosis Same as Pre-procedure Electronic Signature(s) Signed: 10/29/2022 11:19:39 AM By: [name] MD FACS Signed: 10/29/2022 4:36:01 PM By: Karie Schwalbe RN Entered By: Karie Schwalbe on 10/29/2022 07:29:23 DLISA, KALDENBERG [name] ([id number]) [id number] Page 3 of 17 -------------------------------------------------------------------------------- Debridement Details Patient Name: Date of  ulcer of unspecified part of left lower leg with other 08/31/2022 No Yes specified severity 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 10/29/2022 9:30 A M Medical Record Number: [id number] Patient Account Number: [id number] Date of Birth/Sex: Treating RN: 09-28-1955 (67 y.o. F) Primary Care Provider: [name] Other Clinician: Referring Provider: Treating Provider/Extender: Jasmine Pang in Treatment: 8 Subjective Chief Complaint Information obtained from Patient 04/19/2021: The patient is here for ongoing follow-up regarding 2 left lower extremity wounds. 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 She tells [name] that she fell on the gravel driveway in July. She developed injuries on her distal lower leg which have not healed. She saw her primary physician on 11/15/2017 who noted her left shin injuries. Gave her antibiotics. At that point the wounds were almost circumferential however most were less than 1.5 cm. Weeping edema fluid was noted. She was referred here for evaluation. The  patient has a history of chronic lower extremity edema. 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 She has had prior history with leg weeping fluid. She does not wear compression stockings. She is not doing anything specific to these wound areas. 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 She also has lymphedema. Put her in 3 layer compression and silver alginate last week. She has 3 small wounds with depth just lateral to the tibia. More problematically than [place], [name] ([id number]) [id number] Page 10 of 17 this she has numerous shallow areas some of which are almost canal like in shape with tightly adherent painful debris. It would be very difficult and time- consuming to go through this and attempt to individually debride all these areas.. I changed her to collagen today to see if that would help with any of the surface debris on some of these wounds. Otherwise we will not be able to put this in compression we have to have someone change the dressing. The patient is not eligible for home health 12/25/17 on evaluation today patient actually appears to be doing rather well in regard to the ulcer on her lower extremity. Fortunately there does not appear to be evidence of infection at this time. She has been tolerating the dressing changes without complication. This includes the compression wrap. The only issue she had was that the wrap was initially placed over her bunion region which actually calls her some discomfort and pain. Other than that things seem to be going rather  well. 01/01/2018 Seen today for follow-up and management of left lower extremity wound and lymphedema. T oday she presents with a new wound towards to the left lateral LE. Recently treated with a 7 day course  TYSHEMA, KULT ([id number]) [id number] Page 1 of 17 Visit Report for 10/29/2022 Chief Complaint Document Details Patient Name: Date of Service: [name], [name] 10/29/2022 9:30 A M Medical Record Number: [id number] Patient Account Number: [id number] Date of Birth/Sex: Treating RN: 11-12-1955 (67 y.o. F) Primary Care Provider: [name] Other Clinician: Referring Provider: Treating Provider/Extender: Jasmine Pang in Treatment: 8 Information Obtained from: Patient Chief Complaint 04/19/2021: The patient is here for ongoing follow-up regarding 2 left lower extremity wounds. 01/22/2022; patient returns to clinic with a wound on the left anterior lower leg secondary to trauma Electronic Signature(s) Signed: 10/29/2022 10:29:50 AM By: [name] MD FACS Entered By: [name] on 10/29/2022 07:29:50 -------------------------------------------------------------------------------- Debridement Details Patient Name: Date of Service: [name] [name] 10/29/2022 9:30 A M Medical Record Number: [id number] Patient Account Number: [id number] Date of Birth/Sex: Treating RN: 1955-03-27 (67 y.o. [name] Primary Care Provider: [name] Other Clinician: Referring Provider: Treating Provider/Extender: Jasmine Pang in Treatment: 8 Debridement Performed for Assessment: Wound #11 Left,Lateral Lower Leg Performed By: Physician [name], MD The following information was scribed by: Karie Schwalbe The information was scribed for: [name] Debridement Type: Debridement Level of Consciousness (Pre-procedure): Awake and Alert Pre-procedure Verification/Time Out Yes - 10:05 Taken: Start Time: 10:05 Pain Control: Lidocaine 4% T opical Solution Percent of Wound Bed Debrided: 100% T Area Debrided (cm): otal 0.39 Tissue and other material debrided: Non-Viable, Slough,  Slough Level: Non-Viable Tissue Debridement Description: Selective/Open Wound Instrument: Curette Bleeding: Minimum Hemostasis Achieved: Pressure End Time: 10:07 Procedural Pain: 0 Post Procedural Pain: 0 Response to Treatment: Procedure was tolerated well Level of Consciousness (Post- Awake and Alert procedure): Post Debridement Measurements of Total Wound Length: (cm) 1 Width: (cm) 0.5 Depth: (cm) 0.1 MERION, WISSING [name] ([id number]) [id number] Page 2 of 17 Volume: (cm) 0.039 Character of Wound/Ulcer Post Debridement: Improved Post Procedure Diagnosis Same as Pre-procedure Electronic Signature(s) Signed: 10/29/2022 11:19:39 AM By: [name] MD FACS Signed: 10/29/2022 4:36:01 PM By: Karie Schwalbe RN Entered By: Karie Schwalbe on 10/29/2022 07:28:29 -------------------------------------------------------------------------------- Debridement Details Patient Name: Date of Service: [name] 10/29/2022 9:30 A M Medical Record Number: [id number] Patient Account Number: [id number] Date of Birth/Sex: Treating RN: 1956-01-13 (67 y.o. [name] Primary Care Provider: [name] Other Clinician: Referring Provider: Treating Provider/Extender: Jasmine Pang in Treatment: 8 Debridement Performed for Assessment: Wound #9 Left,Distal,Anterior Lower Leg Performed By: Physician [name], MD The following information was scribed by: Karie Schwalbe The information was scribed for: [name] Debridement Type: Debridement Level of Consciousness (Pre-procedure): Awake and Alert Pre-procedure Verification/Time Out Yes - 10:05 Taken: Start Time: 10:05 Pain Control: Lidocaine 4% T opical Solution Percent of Wound Bed Debrided: 100% T Area Debrided (cm): otal 0.35 Tissue and other material debrided: Non-Viable, Slough, Slough Level: Non-Viable Tissue Debridement Description: Selective/Open  Wound Instrument: Curette Bleeding: Minimum Hemostasis Achieved: Pressure End Time: 10:07 Procedural Pain: 0 Post Procedural Pain: 0 Response to Treatment: Procedure was tolerated well Level of Consciousness (Post- Awake and Alert procedure): Post Debridement Measurements of Total Wound Length: (cm) 0.9 Width: (cm) 0.5 Depth: (cm) 0.2 Volume: (cm) 0.071 Character of Wound/Ulcer Post Debridement: Improved Post Procedure Diagnosis Same as Pre-procedure Electronic Signature(s) Signed: 10/29/2022 11:19:39 AM By: [name] MD FACS Signed: 10/29/2022 4:36:01 PM By: Karie Schwalbe RN Entered By: Karie Schwalbe on 10/29/2022 07:29:23 DLISA, KALDENBERG [name] ([id number]) [id number] Page 3 of 17 -------------------------------------------------------------------------------- Debridement Details Patient Name: Date of  ulcer of unspecified part of left lower leg with other 08/31/2022 No Yes specified severity 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 10/29/2022 9:30 A M Medical Record Number: [id number] Patient Account Number: [id number] Date of Birth/Sex: Treating RN: 09-28-1955 (67 y.o. F) Primary Care Provider: [name] Other Clinician: Referring Provider: Treating Provider/Extender: Jasmine Pang in Treatment: 8 Subjective Chief Complaint Information obtained from Patient 04/19/2021: The patient is here for ongoing follow-up regarding 2 left lower extremity wounds. 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 She tells [name] that she fell on the gravel driveway in July. She developed injuries on her distal lower leg which have not healed. She saw her primary physician on 11/15/2017 who noted her left shin injuries. Gave her antibiotics. At that point the wounds were almost circumferential however most were less than 1.5 cm. Weeping edema fluid was noted. She was referred here for evaluation. The  patient has a history of chronic lower extremity edema. 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 She has had prior history with leg weeping fluid. She does not wear compression stockings. She is not doing anything specific to these wound areas. 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 She also has lymphedema. Put her in 3 layer compression and silver alginate last week. She has 3 small wounds with depth just lateral to the tibia. More problematically than ALMADELIA, [name] ([id number]) [id number] Page 10 of 17 this she has numerous shallow areas some of which are almost canal like in shape with tightly adherent painful debris. It would be very difficult and time- consuming to go through this and attempt to individually debride all these areas.. I changed her to collagen today to see if that would help with any of the surface debris on some of these wounds. Otherwise we will not be able to put this in compression we have to have someone change the dressing. The patient is not eligible for home health 12/25/17 on evaluation today patient actually appears to be doing rather well in regard to the ulcer on her lower extremity. Fortunately there does not appear to be evidence of infection at this time. She has been tolerating the dressing changes without complication. This includes the compression wrap. The only issue she had was that the wrap was initially placed over her bunion region which actually calls her some discomfort and pain. Other than that things seem to be going rather  well. 01/01/2018 Seen today for follow-up and management of left lower extremity wound and lymphedema. T oday she presents with a new wound towards to the left lateral LE. Recently treated with a 7 day course  TYSHEMA, KULT ([id number]) [id number] Page 1 of 17 Visit Report for 10/29/2022 Chief Complaint Document Details Patient Name: Date of Service: [name], [name] 10/29/2022 9:30 A M Medical Record Number: [id number] Patient Account Number: [id number] Date of Birth/Sex: Treating RN: 11-12-1955 (67 y.o. F) Primary Care Provider: [name] Other Clinician: Referring Provider: Treating Provider/Extender: Jasmine Pang in Treatment: 8 Information Obtained from: Patient Chief Complaint 04/19/2021: The patient is here for ongoing follow-up regarding 2 left lower extremity wounds. 01/22/2022; patient returns to clinic with a wound on the left anterior lower leg secondary to trauma Electronic Signature(s) Signed: 10/29/2022 10:29:50 AM By: [name] MD FACS Entered By: [name] on 10/29/2022 07:29:50 -------------------------------------------------------------------------------- Debridement Details Patient Name: Date of Service: [name] [name] 10/29/2022 9:30 A M Medical Record Number: [id number] Patient Account Number: [id number] Date of Birth/Sex: Treating RN: 1955-03-27 (67 y.o. [name] Primary Care Provider: [name] Other Clinician: Referring Provider: Treating Provider/Extender: Jasmine Pang in Treatment: 8 Debridement Performed for Assessment: Wound #11 Left,Lateral Lower Leg Performed By: Physician [name], MD The following information was scribed by: Karie Schwalbe The information was scribed for: [name] Debridement Type: Debridement Level of Consciousness (Pre-procedure): Awake and Alert Pre-procedure Verification/Time Out Yes - 10:05 Taken: Start Time: 10:05 Pain Control: Lidocaine 4% T opical Solution Percent of Wound Bed Debrided: 100% T Area Debrided (cm): otal 0.39 Tissue and other material debrided: Non-Viable, Slough,  Slough Level: Non-Viable Tissue Debridement Description: Selective/Open Wound Instrument: Curette Bleeding: Minimum Hemostasis Achieved: Pressure End Time: 10:07 Procedural Pain: 0 Post Procedural Pain: 0 Response to Treatment: Procedure was tolerated well Level of Consciousness (Post- Awake and Alert procedure): Post Debridement Measurements of Total Wound Length: (cm) 1 Width: (cm) 0.5 Depth: (cm) 0.1 MERION, WISSING [name] ([id number]) [id number] Page 2 of 17 Volume: (cm) 0.039 Character of Wound/Ulcer Post Debridement: Improved Post Procedure Diagnosis Same as Pre-procedure Electronic Signature(s) Signed: 10/29/2022 11:19:39 AM By: [name] MD FACS Signed: 10/29/2022 4:36:01 PM By: Karie Schwalbe RN Entered By: Karie Schwalbe on 10/29/2022 07:28:29 -------------------------------------------------------------------------------- Debridement Details Patient Name: Date of Service: [name] 10/29/2022 9:30 A M Medical Record Number: [id number] Patient Account Number: [id number] Date of Birth/Sex: Treating RN: 1956-01-13 (67 y.o. [name] Primary Care Provider: [name] Other Clinician: Referring Provider: Treating Provider/Extender: Jasmine Pang in Treatment: 8 Debridement Performed for Assessment: Wound #9 Left,Distal,Anterior Lower Leg Performed By: Physician [name], MD The following information was scribed by: Karie Schwalbe The information was scribed for: [name] Debridement Type: Debridement Level of Consciousness (Pre-procedure): Awake and Alert Pre-procedure Verification/Time Out Yes - 10:05 Taken: Start Time: 10:05 Pain Control: Lidocaine 4% T opical Solution Percent of Wound Bed Debrided: 100% T Area Debrided (cm): otal 0.35 Tissue and other material debrided: Non-Viable, Slough, Slough Level: Non-Viable Tissue Debridement Description: Selective/Open  Wound Instrument: Curette Bleeding: Minimum Hemostasis Achieved: Pressure End Time: 10:07 Procedural Pain: 0 Post Procedural Pain: 0 Response to Treatment: Procedure was tolerated well Level of Consciousness (Post- Awake and Alert procedure): Post Debridement Measurements of Total Wound Length: (cm) 0.9 Width: (cm) 0.5 Depth: (cm) 0.2 Volume: (cm) 0.071 Character of Wound/Ulcer Post Debridement: Improved Post Procedure Diagnosis Same as Pre-procedure Electronic Signature(s) Signed: 10/29/2022 11:19:39 AM By: [name] MD FACS Signed: 10/29/2022 4:36:01 PM By: Karie Schwalbe RN Entered By: Karie Schwalbe on 10/29/2022 07:29:23 DLISA, KALDENBERG [name] ([id number]) [id number] Page 3 of 17 -------------------------------------------------------------------------------- Debridement Details Patient Name: Date of  fibrotic today. 10/08/2022: The proximal wound is healed. The more distal wound is smaller. There is some slough accumulation and a little bit of eschar  around the edges. Today, the patient pointed out a wound on her left lower abdomen that has been present since a colostomy reversal in 2018. She has not ever discussed this with the surgeon that performed her ostomy takedown and this is the first time she has mentioned it to me. The tissue was very friable and apparently her PCP just started her on Augmentin, although I do not see any obvious signs of infection. 10/16/2022: The leg wound is smaller again today and more superficial. It is much cleaner with very little slough on the surface. The old ostomy site still has hypertrophic granulation tissue present. She is awaiting approval from insurance to see Dr. Maisie Fus, the surgeon that performed her ostomy reversal. 10/22/2022: The leg wound continues to contract. There is minimal slough on the surface. Edema control is good. The old ostomy site looks about the same, but the hypertrophic granulation tissue is less prominent today. 10/29/2022: She has a new leg wound just proximal and lateral to the existing site. There is a little bit of slough on the surface. It looks as though perhaps some skin tore here. The existing leg wound is more superficial and also has a layer of slough on the surface. The old ostomy site is stable with some slough present. She still does not have an appointment with surgery. Electronic Signature(s) Signed: 10/29/2022 10:30:54 AM By: [name] MD FACS Entered By: [name] on 10/29/2022 07:30:54 [name], [name] ([id number]) [id number] Page 7 of 17 -------------------------------------------------------------------------------- Physical Exam Details Patient Name: Date of Service: [name], [name] 10/29/2022 9:30 A M Medical Record Number: [id number] Patient Account Number: [id number] Date of Birth/Sex: Treating RN: 1955-08-03 (67 y.o. F) Primary Care Provider: [name] Other Clinician: Referring Provider: Treating  Provider/Extender: Jasmine Pang in Treatment: 8 Constitutional . . . . no acute distress. Respiratory Normal work of breathing on room air. Notes 10/29/2022: She has a new leg wound just proximal and lateral to the existing site. There is a little bit of slough on the surface. It looks as though perhaps some skin tore here. The existing leg wound is more superficial and also has a layer of slough on the surface. The old ostomy site is stable with some slough present. Electronic Signature(s) Signed: 10/29/2022 10:31:50 AM By: [name] MD FACS Entered By: [name] on 10/29/2022 07:31:49 -------------------------------------------------------------------------------- Physician Orders Details Patient Name: Date of Service: [name] [name] 10/29/2022 9:30 A M Medical Record Number: [id number] Patient Account Number: [id number] Date of Birth/Sex: Treating RN: 17-Nov-1955 (67 y.o. 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 - Dr. Lady Gary 11/05/22 at 9:30am Return A Room 3 ppointment in 2 weeks. - Please ask front desk for appointments Return A Discharge From [facility] - Please keep wearing compression stockings. Anesthetic (In clinic) Topical Lidocaine 4% applied to wound bed Bathing/ Shower/ Hygiene May shower with protection but do not

## 2022-10-29 NOTE — Progress Notes (Signed)
Instruction: Apply four layer compression as directed. May also use Urgo K2 compression system as alternative. Compression [address] BEATRICE, MIRE D ([id number]) [id number] Page 12 of 12 Add-Ons Electronic Signature(s) Signed: 10/29/2022 4:36:01 PM By: Karie Schwalbe RN Entered By: Karie Schwalbe on 10/29/2022 07:06:42 -------------------------------------------------------------------------------- Vitals Details Patient Name: Date of Service: [name] [name]. 10/29/2022 9:30 A M Medical Record Number: [id number] Patient Account Number: [id number] Date of Birth/Sex: Treating RN: January 27, 1956 (67 y.o. [name] Primary Care [name]: [name] Other Clinician: Referring Alette Kataoka: Treating Delicia Berens/Extender: Jasmine Pang in Treatment: 8 Vital Signs Time Taken: 09:46 Temperature (F): 98,2 Height (in): 62 Pulse (bpm): 77 Weight (lbs): 221 Respiratory Rate (breaths/min): 16 Body Mass Index (BMI): 40.4 Blood Pressure (mmHg): 139/74 Reference Range: 80 - 120 mg / dl Electronic Signature(s) Signed: 10/29/2022 4:36:01 PM By: Karie Schwalbe RN Entered By: Karie Schwalbe on 10/29/2022 07:15:32  HALEN, EASTMAN ([id number]) [id number] Page 1 of 12 Visit Report for 10/29/2022 Arrival Information Details Patient Name: Date of Service: [name] 10/29/2022 9:30 A M Medical Record Number: [id number] Patient Account Number: [id number] Date of Birth/Sex: Treating RN: Jul 02, 1955 (67 y.o. [name] Primary Care Artice Bergerson: [name] Other Clinician: Referring Asencion Loveday: Treating Azael Ragain/Extender: Jasmine Pang in Treatment: 8 Visit Information History Since Last Visit Added or deleted any medications: No Patient Arrived: Dan Humphreys Any new allergies or adverse reactions: No Arrival Time: 09:45 Had a fall or experienced change in No Accompanied By: self activities of daily living that may affect Transfer Assistance: None risk of falls: Patient Identification Verified: Yes Signs or symptoms of abuse/neglect since last visito No Patient Requires Transmission-Based Precautions: No Hospitalized since last visit: No Patient Has Alerts: No Implantable device outside of the clinic excluding No cellular tissue based products placed in the center since last visit: Has Dressing in Place as Prescribed: Yes Has Compression in Place as Prescribed: Yes Pain Present Now: Yes Electronic Signature(s) Signed: 10/29/2022 4:36:01 PM By: Karie Schwalbe RN Entered By: Karie Schwalbe on 10/29/2022 07:15:08 -------------------------------------------------------------------------------- Compression Therapy Details Patient Name: Date of Service: Erin Philips D. 10/29/2022 9:30 A M Medical Record Number: [id number] Patient Account Number: [id number] Date of Birth/Sex: Treating RN: 10-20-1955 (67 y.o. [name] Primary Care Heinz Eckert: [name] Other Clinician: Referring Marbeth Smedley: Treating Kaysen Deal/Extender: Jasmine Pang in Treatment: 8 Compression Therapy Performed for Wound Assessment:  Wound #11 Left,Lateral Lower Leg Performed By: Clinician Karie Schwalbe, RN Compression Type: Four Layer Post Procedure Diagnosis Same as Pre-procedure Electronic Signature(s) Signed: 10/29/2022 4:36:01 PM By: Karie Schwalbe RN Entered By: Karie Schwalbe on 10/29/2022 07:17:11 JAMAE, PRESSWOOD D ([id number]) [id number] Page 2 of 12 -------------------------------------------------------------------------------- Compression Therapy Details Patient Name: Date of Service: [name] 10/29/2022 9:30 A M Medical Record Number: [id number] Patient Account Number: [id number] Date of Birth/Sex: Treating RN: 1955/07/25 (67 y.o. [name] Primary Care Oniel Meleski: [name] Other Clinician: Referring Leilana Mcquire: Treating Justin Buechner/Extender: Jasmine Pang in Treatment: 8 Compression Therapy Performed for Wound Assessment: Wound #9 Left,Distal,Anterior Lower Leg Performed By: Clinician Karie Schwalbe, RN Compression Type: Four Layer Post Procedure Diagnosis Same as Pre-procedure Electronic Signature(s) Signed: 10/29/2022 4:36:01 PM By: Karie Schwalbe RN Entered By: Karie Schwalbe on 10/29/2022 07:17:11 -------------------------------------------------------------------------------- Encounter Discharge Information Details Patient Name: Date of Service: Erin Philips D. 10/29/2022 9:30 A M Medical Record Number: [id number] Patient Account Number: [id number] Date of Birth/Sex: Treating RN: Jun 12, 1955 (68 y.o. [name] Primary Care Anaiya Wisinski: [name] Other Clinician: Referring Nayib Remer: Treating Tammie Yanda/Extender: Jasmine Pang in Treatment: 8 Encounter Discharge Information Items Post Procedure Vitals Discharge Condition: Stable Temperature (F): 98.2 Ambulatory Status: Walker Pulse (bpm): 77 Discharge Destination: Home Respiratory Rate (breaths/min): 16 Transportation: Private  Auto Blood Pressure (mmHg): 139/74 Accompanied By: self Schedule Follow-up Appointment: Yes Clinical Summary of Care: Patient Declined Electronic Signature(s) Signed: 10/29/2022 4:36:01 PM By: Karie Schwalbe RN Entered By: Karie Schwalbe on 10/29/2022 12:02:51 -------------------------------------------------------------------------------- Lower Extremity Assessment Details Patient Name: Date of Service: [name] 10/29/2022 9:30 A M Medical Record Number: [id number] Patient Account Number: [id number] Date of Birth/Sex: Treating RN: [birth year] (67 y.o. [name] Primary Care Zuha Dejonge: [name] Other Clinician: Referring Erina Hamme: Treating Kandyce Dieguez/Extender: Jasmine Pang in Treatment: 8 Edema Assessment Assessed: [Left: No] Franne Forts: No] [Left: Edema] [Right: :] LAVEDA, HUPP D ([id number]) [id number] Page 3  4:36:01 PM By: Karie Schwalbe RN Entered By: Karie Schwalbe on 10/29/2022 07:16:15 -------------------------------------------------------------------------------- Patient/Caregiver Education Details Patient Name: Date of Service: [name] 9/16/2024andnbsp9:30 A M Medical Record Number: [id number] Patient Account Number: [id number] Date of Birth/Gender: Treating RN: 05-Feb-1956 (67 y.o. [name] Primary Care Physician: [name] Other Clinician: Referring Physician: Treating Physician/Extender: Jasmine Pang in Treatment: 8 Education Assessment Education Provided To: Patient Education Topics Provided Wound/Skin Impairment: Methods: Explain/Verbal Responses: State content correctly Electronic Signature(s) Signed: 10/29/2022 4:36:01 PM By: Karie Schwalbe RN Entered By: Karie Schwalbe on 10/29/2022 07:18:51 -------------------------------------------------------------------------------- Wound Assessment Details Patient Name: Date of Service: Erin Philips D. 10/29/2022 9:30 A M Medical Record Number: [id number] Patient Account Number: [id number] Date of Birth/Sex: Treating RN: 27-May-1955 (67 y.o. [name] Primary Care Sidney Silberman: [name] Other Clinician: Referring Isidro Monks: Treating Rickie Gutierres/Extender: Jasmine Pang in Treatment: 8 Wound Status Wound Number: 10 Primary Abscess Etiology: Wound Location: Left Abdomen - Lower Quadrant Wound  Open Wounding Event: Surgical Injury Status: Date Acquired: 02/06/2017 Comorbid Cataracts, Lymphedema, Hypertension, Peripheral Venous Weeks Of Treatment: 3 History: Disease, Osteoarthritis Clustered Wound: No Photos JAYLANNI, GRAHOVAC ([id number]) [id number] Page 8 of 12 Wound Measurements Length: (cm) 1.8 Width: (cm) 1.2 Depth: (cm) 0.1 Area: (cm) 1.696 Volume: (cm) 0.17 % Reduction in Area: 33.6% % Reduction in Volume: 33.3% Epithelialization: Small (1-33%) Tunneling: No Undermining: No Wound Description Classification: Full Thickness Without Exposed Support Structures Exudate Amount: Medium Exudate Type: Serosanguineous Exudate Color: red, brown Foul Odor After Cleansing: No Slough/Fibrino Yes Wound Bed Granulation Amount: Large (67-100%) Exposed Structure Granulation Quality: Red, Hyper-granulation Fascia Exposed: No Necrotic Amount: Small (1-33%) Fat Layer (Subcutaneous Tissue) Exposed: Yes Necrotic Quality: Adherent Slough Tendon Exposed: No Muscle Exposed: No Joint Exposed: No Bone Exposed: No Periwound Skin Texture Texture Color No Abnormalities Noted: No No Abnormalities Noted: Yes Scarring: Yes Temperature / Pain Temperature: No Abnormality Moisture No Abnormalities Noted: Yes Treatment Notes Wound #10 (Abdomen - Lower Quadrant) Wound Laterality: Left Cleanser Soap and Water Discharge Instruction: May shower and wash wound with dial antibacterial soap and water prior to dressing change. Vashe 5.8 (oz) Discharge Instruction: Cleanse the wound with Vashe prior to applying a clean dressing using gauze sponges, not tissue or cotton balls. Wound Cleanser Discharge Instruction: Cleanse the wound with wound cleanser prior to applying a clean dressing using gauze sponges, not tissue or cotton balls. Peri-Wound Care Topical Primary Dressing Maxorb Extra Ag+ Alginate Dressing, 2x2 (in/in) Discharge Instruction: Apply to wound bed  as instructed Secondary Dressing ABD Pad, 8x10 Discharge Instruction: Apply over primary dressing as directed. Secured With [employer] T 2x10 (in/yd) ape Discharge Instruction: Secure with tape as directed. Compression Wrap Compression [address] SHAMONI, MEARS D ([id number]) [id number] Page 9 of 12 Add-Ons Electronic Signature(s) Signed: 10/29/2022 4:36:01 PM By: Karie Schwalbe RN Entered By: Karie Schwalbe on 10/29/2022 07:07:50 -------------------------------------------------------------------------------- Wound Assessment Details Patient Name: Date of Service: CHELSEE, DRAGONE D. 10/29/2022 9:30 A M Medical Record Number: [id number] Patient Account Number: [id number] Date of Birth/Sex: Treating RN: 12/31/1955 (67 y.o. [name] Primary Care Youssef Footman: [name] Other Clinician: Referring Tytiana Coles: Treating Gladys Gutman/Extender: Jasmine Pang in Treatment: 8 Wound Status Wound Number: 11 Primary Lymphedema Etiology: Wound Location: Left, Lateral Lower Leg Wound Open Wounding Event: Gradually Appeared Status: Date Acquired: 10/26/2022 Comorbid Cataracts, Lymphedema, Hypertension, Peripheral Venous Weeks Of Treatment: 0 History: Disease, Osteoarthritis Clustered Wound: No Photos Wound Measurements Length: (cm) 1 Width: (  of 12 Left: Right: Point of Measurement: 34 cm From Medial Instep 37.1 cm 45.5 cm Ankle Left: Right: Point of Measurement: 9 cm From Medial Instep 22.1 cm 23 cm Vascular Assessment Pulses: Dorsalis Pedis Palpable: [Left:Yes] Extremity colors, hair growth, and conditions: Hair Growth on Extremity: [Left:No] [Right:No] Temperature of Extremity: [Left:Warm] [Right:Warm] Capillary Refill: [Left:< 3 seconds] [Right:< 3 seconds] Dependent Rubor: [Left:No No] [Right:No No] Electronic Signature(s) Signed: 10/29/2022 4:36:01 PM By: Karie Schwalbe RN Entered By: Karie Schwalbe on 10/29/2022 07:16:37 -------------------------------------------------------------------------------- Multi Wound Chart Details Patient Name: Date of Service: Erin Philips D. 10/29/2022 9:30 A M Medical Record Number: [id number] Patient Account Number: [id number] Date of Birth/Sex: Treating RN: 1955/10/04 (66 y.o. F) Primary Care Cheral Cappucci: [name] Other Clinician: Referring Kissa Campoy: Treating Zara Wendt/Extender:  Jasmine Pang in Treatment: 8 Vital Signs Height(in): 62 Pulse(bpm): 77 Weight(lbs): 221 Blood Pressure(mmHg): 139/74 Body Mass Index(BMI): 40.4 Temperature(F): 98,2 Respiratory Rate(breaths/min): 16 [10:Photos:] Left Abdomen - Lower Quadrant Left, Lateral Lower Leg Left, Distal, Anterior Lower Leg Wound Location: Surgical Injury Gradually Appeared Gradually Appeared Wounding Event: Abscess Lymphedema Vasculitis Primary Etiology: Cataracts, Lymphedema, Cataracts, Lymphedema, Cataracts, Lymphedema, Comorbid History: Hypertension, Peripheral Venous Hypertension, Peripheral Venous Hypertension, Peripheral Venous Disease, Osteoarthritis Disease, Osteoarthritis Disease, Osteoarthritis 02/06/2017 10/26/2022 07/14/2022 Date Acquired: 3 0 8 Weeks of Treatment: Open Open Open Wound Status: No No No Wound Recurrence: 1.8x1.2x0.1 1x0.5x0.1 0.9x0.5x0.2 Measurements L x W x D (cm) 1.696 0.393 0.353 A (cm) : rea 0.17 0.039 0.071 Volume (cm) : 33.60% N/A 96.30% % Reduction in Area: 33.30% N/A 92.50% % Reduction in Volume: Full Thickness Without Exposed Full Thickness Without Exposed Full Thickness Without Exposed Classification: Support Structures Support Structures Support Structures [place], Navesink D ([id number]) [phone].pdf Page 4 of 12 Medium Medium Medium Exudate Amount: Serosanguineous Serosanguineous Serosanguineous Exudate Type: red, brown red, brown red, brown Exudate Color: N/A Distinct, outline attached N/A Wound Margin: Large (67-100%) Large (67-100%) Small (1-33%) Granulation Amount: Red, Hyper-granulation Red, Hyper-granulation Red Granulation Quality: Small (1-33%) Small (1-33%) Large (67-100%) Necrotic Amount: Fat Layer (Subcutaneous Tissue): Yes Fat Layer (Subcutaneous Tissue): Yes Fat Layer (Subcutaneous Tissue): Yes Exposed Structures: Fascia: No Fascia: No Fascia: No Tendon: No Tendon: No Tendon:  No Muscle: No Muscle: No Muscle: No Joint: No Joint: No Joint: No Bone: No Bone: No Bone: No Small (1-33%) Small (1-33%) Medium (34-66%) Epithelialization: Scarring: Yes Scarring: Yes Scarring: Yes Periwound Skin Texture: No Abnormalities Noted No Abnormalities Noted No Abnormalities Noted Periwound Skin Moisture: No Abnormalities Noted Hemosiderin Staining: Yes Hemosiderin Staining: Yes Periwound Skin Color: No Abnormality No Abnormality No Abnormality Temperature: N/A Compression Therapy Compression Therapy Procedures Performed: Treatment Notes Wound #10 (Abdomen - Lower Quadrant) Wound Laterality: Left Cleanser Soap and Water Discharge Instruction: May shower and wash wound with dial antibacterial soap and water prior to dressing change. Vashe 5.8 (oz) Discharge Instruction: Cleanse the wound with Vashe prior to applying a clean dressing using gauze sponges, not tissue or cotton balls. Wound Cleanser Discharge Instruction: Cleanse the wound with wound cleanser prior to applying a clean dressing using gauze sponges, not tissue or cotton balls. Peri-Wound Care Topical Primary Dressing Maxorb Extra Ag+ Alginate Dressing, 2x2 (in/in) Discharge Instruction: Apply to wound bed as instructed Secondary Dressing ABD Pad, 8x10 Discharge Instruction: Apply over primary dressing as directed. Secured With [employer] T 2x10 (in/yd) ape Discharge Instruction: Secure with tape as directed. Compression Wrap Compression Stockings Add-Ons Wound #11 (Lower Leg) Wound Laterality: Left, Lateral Cleanser Vashe 5.8 (oz) Discharge Instruction:  Instruction: Apply four layer compression as directed. May also use Urgo K2 compression system as alternative. Compression [address] BEATRICE, MIRE D ([id number]) [id number] Page 12 of 12 Add-Ons Electronic Signature(s) Signed: 10/29/2022 4:36:01 PM By: Karie Schwalbe RN Entered By: Karie Schwalbe on 10/29/2022 07:06:42 -------------------------------------------------------------------------------- Vitals Details Patient Name: Date of Service: [name] [name]. 10/29/2022 9:30 A M Medical Record Number: [id number] Patient Account Number: [id number] Date of Birth/Sex: Treating RN: January 27, 1956 (67 y.o. [name] Primary Care [name]: [name] Other Clinician: Referring Alette Kataoka: Treating Delicia Berens/Extender: Jasmine Pang in Treatment: 8 Vital Signs Time Taken: 09:46 Temperature (F): 98,2 Height (in): 62 Pulse (bpm): 77 Weight (lbs): 221 Respiratory Rate (breaths/min): 16 Body Mass Index (BMI): 40.4 Blood Pressure (mmHg): 139/74 Reference Range: 80 - 120 mg / dl Electronic Signature(s) Signed: 10/29/2022 4:36:01 PM By: Karie Schwalbe RN Entered By: Karie Schwalbe on 10/29/2022 07:15:32  HALEN, EASTMAN ([id number]) [id number] Page 1 of 12 Visit Report for 10/29/2022 Arrival Information Details Patient Name: Date of Service: [name] 10/29/2022 9:30 A M Medical Record Number: [id number] Patient Account Number: [id number] Date of Birth/Sex: Treating RN: Jul 02, 1955 (67 y.o. [name] Primary Care Artice Bergerson: [name] Other Clinician: Referring Asencion Loveday: Treating Azael Ragain/Extender: Jasmine Pang in Treatment: 8 Visit Information History Since Last Visit Added or deleted any medications: No Patient Arrived: Dan Humphreys Any new allergies or adverse reactions: No Arrival Time: 09:45 Had a fall or experienced change in No Accompanied By: self activities of daily living that may affect Transfer Assistance: None risk of falls: Patient Identification Verified: Yes Signs or symptoms of abuse/neglect since last visito No Patient Requires Transmission-Based Precautions: No Hospitalized since last visit: No Patient Has Alerts: No Implantable device outside of the clinic excluding No cellular tissue based products placed in the center since last visit: Has Dressing in Place as Prescribed: Yes Has Compression in Place as Prescribed: Yes Pain Present Now: Yes Electronic Signature(s) Signed: 10/29/2022 4:36:01 PM By: Karie Schwalbe RN Entered By: Karie Schwalbe on 10/29/2022 07:15:08 -------------------------------------------------------------------------------- Compression Therapy Details Patient Name: Date of Service: Erin Philips D. 10/29/2022 9:30 A M Medical Record Number: [id number] Patient Account Number: [id number] Date of Birth/Sex: Treating RN: 10-20-1955 (67 y.o. [name] Primary Care Heinz Eckert: [name] Other Clinician: Referring Marbeth Smedley: Treating Kaysen Deal/Extender: Jasmine Pang in Treatment: 8 Compression Therapy Performed for Wound Assessment:  Wound #11 Left,Lateral Lower Leg Performed By: Clinician Karie Schwalbe, RN Compression Type: Four Layer Post Procedure Diagnosis Same as Pre-procedure Electronic Signature(s) Signed: 10/29/2022 4:36:01 PM By: Karie Schwalbe RN Entered By: Karie Schwalbe on 10/29/2022 07:17:11 JAMAE, PRESSWOOD D ([id number]) [id number] Page 2 of 12 -------------------------------------------------------------------------------- Compression Therapy Details Patient Name: Date of Service: [name] 10/29/2022 9:30 A M Medical Record Number: [id number] Patient Account Number: [id number] Date of Birth/Sex: Treating RN: 1955/07/25 (67 y.o. [name] Primary Care Oniel Meleski: [name] Other Clinician: Referring Leilana Mcquire: Treating Justin Buechner/Extender: Jasmine Pang in Treatment: 8 Compression Therapy Performed for Wound Assessment: Wound #9 Left,Distal,Anterior Lower Leg Performed By: Clinician Karie Schwalbe, RN Compression Type: Four Layer Post Procedure Diagnosis Same as Pre-procedure Electronic Signature(s) Signed: 10/29/2022 4:36:01 PM By: Karie Schwalbe RN Entered By: Karie Schwalbe on 10/29/2022 07:17:11 -------------------------------------------------------------------------------- Encounter Discharge Information Details Patient Name: Date of Service: Erin Philips D. 10/29/2022 9:30 A M Medical Record Number: [id number] Patient Account Number: [id number] Date of Birth/Sex: Treating RN: Jun 12, 1955 (68 y.o. [name] Primary Care Anaiya Wisinski: [name] Other Clinician: Referring Nayib Remer: Treating Tammie Yanda/Extender: Jasmine Pang in Treatment: 8 Encounter Discharge Information Items Post Procedure Vitals Discharge Condition: Stable Temperature (F): 98.2 Ambulatory Status: Walker Pulse (bpm): 77 Discharge Destination: Home Respiratory Rate (breaths/min): 16 Transportation: Private  Auto Blood Pressure (mmHg): 139/74 Accompanied By: self Schedule Follow-up Appointment: Yes Clinical Summary of Care: Patient Declined Electronic Signature(s) Signed: 10/29/2022 4:36:01 PM By: Karie Schwalbe RN Entered By: Karie Schwalbe on 10/29/2022 12:02:51 -------------------------------------------------------------------------------- Lower Extremity Assessment Details Patient Name: Date of Service: [name] 10/29/2022 9:30 A M Medical Record Number: [id number] Patient Account Number: [id number] Date of Birth/Sex: Treating RN: [birth year] (67 y.o. [name] Primary Care Zuha Dejonge: [name] Other Clinician: Referring Erina Hamme: Treating Kandyce Dieguez/Extender: Jasmine Pang in Treatment: 8 Edema Assessment Assessed: [Left: No] Franne Forts: No] [Left: Edema] [Right: :] LAVEDA, HUPP D ([id number]) [id number] Page 3

## 2022-11-05 ENCOUNTER — Encounter (HOSPITAL_BASED_OUTPATIENT_CLINIC_OR_DEPARTMENT_OTHER): Payer: Commercial Managed Care - PPO | Admitting: General Surgery

## 2022-11-05 DIAGNOSIS — I739 Peripheral vascular disease, unspecified: Secondary | ICD-10-CM | POA: Diagnosis not present

## 2022-11-05 DIAGNOSIS — E785 Hyperlipidemia, unspecified: Secondary | ICD-10-CM | POA: Diagnosis not present

## 2022-11-05 DIAGNOSIS — M318 Other specified necrotizing vasculopathies: Secondary | ICD-10-CM | POA: Diagnosis not present

## 2022-11-05 DIAGNOSIS — I10 Essential (primary) hypertension: Secondary | ICD-10-CM | POA: Diagnosis not present

## 2022-11-05 DIAGNOSIS — L02211 Cutaneous abscess of abdominal wall: Secondary | ICD-10-CM | POA: Diagnosis not present

## 2022-11-05 DIAGNOSIS — L97928 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: Secondary | ICD-10-CM | POA: Diagnosis not present

## 2022-11-05 DIAGNOSIS — I872 Venous insufficiency (chronic) (peripheral): Secondary | ICD-10-CM | POA: Diagnosis not present

## 2022-11-05 DIAGNOSIS — M1990 Unspecified osteoarthritis, unspecified site: Secondary | ICD-10-CM | POA: Diagnosis not present

## 2022-11-05 DIAGNOSIS — I776 Arteritis, unspecified: Secondary | ICD-10-CM | POA: Diagnosis not present

## 2022-11-05 DIAGNOSIS — E039 Hypothyroidism, unspecified: Secondary | ICD-10-CM | POA: Diagnosis not present

## 2022-11-05 DIAGNOSIS — I890 Lymphedema, not elsewhere classified: Secondary | ICD-10-CM | POA: Diagnosis not present

## 2022-11-06 NOTE — Progress Notes (Signed)
Laterality: Left, Anterior, Distal Cleanser: Vashe 5.8 (oz) 1 x Per Week/30 Days Discharge Instructions: Cleanse the wound with Vashe prior to applying a clean dressing using gauze sponges, not tissue or cotton balls. Topical: Gentamicin 1 x Per Week/30 Days Discharge Instructions: As directed by physician Topical: Mupirocin Ointment 1 x Per Week/30 Days Discharge  Instructions: Apply Mupirocin (Bactroban) as instructed Prim Dressing: Maxorb Extra Ag+ Alginate Dressing, 4x4.75 (in/in) 1 x Per Week/30 Days ary Discharge Instructions: Apply to wound bed as instructed Secondary Dressing: ABD Pad, 8x10 1 x Per Week/30 Days Discharge Instructions: Apply over primary dressing as directed. Compression Wrap: FourPress (4 layer compression wrap) 1 x Per Week/30 Days Discharge Instructions: Apply four layer compression as directed. May also use Urgo K2 compression system as alternative. Compression Wrap: Unnaboot w/Calamine, 4x10 (in/yd) 1 x Per Week/30 Days Discharge Instructions: Apply Unnaboot as directed. 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 - (ICD10 L98.499 - Non-pressure [facility], Dr. 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 [name] MD Patient Name: Provider: January 25, 1956 [id number] Date of Birth: NPI#: [id number] Sex: DEA #: [phone] Phone #: License #: UPN: Patient Address: [address] [place]igh[place] Bridegroom [facility] [place], [place] [address] 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 [facility] Surgery, Dr. Elease Hashimoto Signature: Date(s): Electronic Signature(s) Signed: 11/05/2022 10:39:46 AM By: [name] MD FACS Entered By: [name] on  11/05/2022 10:39:46 -------------------------------------------------------------------------------- Problem List Details Patient Name: Date of Service: [name] 11/05/2022 9:30 A M Medical Record Number: [id number] Patient Account Number: [id number] Date of Birth/Sex: Treating RN: December 12, 1955 (67 y.o. 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 11/05/2022 9:30 A M Medical Record Number: [id number] Patient Account Number: [id number] Date of Birth/Sex: Treating RN: January 07, 1956 (67 y.o. F) Primary Care Provider: [name] Other Clinician: Referring Provider: Treating Provider/Extender: Jasmine Pang in Treatment: 9 Subjective Chief Complaint Information obtained from Patient 04/19/2021: The patient is here for ongoing follow-up regarding 2 left lower extremity wounds. 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 She tells [name] that she fell on the gravel driveway in July. She developed injuries on her distal lower leg which have not healed. She saw  Ag+ Alginate Dressing, 2x2 (in/in) 1 x Per Day/30 Days ary Discharge Instructions: Apply to wound bed as instructed Secondary Dressing: ABD Pad, 8x10 1 x Per Day/30 Days Discharge Instructions: Apply over primary dressing as directed. Secured With: 82M Medipore Scientist, research (life sciences) Surgical T 2x10 (in/yd) 1 x Per Day/30 Days ape Discharge Instructions: Secure with tape as directed. WOUND #9: - Lower Leg Wound Laterality: Left, Anterior, Distal Cleanser: Vashe 5.8 (oz) 1 x Per Week/30 Days Discharge Instructions: Cleanse the wound with Vashe prior to applying a clean dressing using gauze sponges, not tissue or cotton balls. Topical: Gentamicin 1 x Per Week/30 Days Discharge Instructions: As directed by physician Topical: Mupirocin Ointment 1 x Per Week/30 Days Discharge Instructions: Apply Mupirocin (Bactroban) as instructed Prim Dressing: Maxorb Extra Ag+ Alginate Dressing, 4x4.75 (in/in) 1 x Per Week/30 Days ary Discharge Instructions: Apply to wound bed as  instructed Secondary Dressing: ABD Pad, 8x10 1 x Per Week/30 Days Discharge Instructions: Apply over primary dressing as directed. Com pression Wrap: FourPress (4 layer compression wrap) 1 x Per Week/30 Days Discharge Instructions: Apply four layer compression as directed. May also use Urgo K2 compression system as alternative. Com pression Wrap: Unnaboot w/Calamine, 4x10 (in/yd) 1 x Per Week/30 Days Discharge Instructions: Apply Unnaboot as directed. 11/05/2022: The satellite wound that was new last week is closed. The initial leg wound is smaller and more superficial with minimal slough on the surface. The old ostomy site is stable with some slough present. Apparently the patient has been waiting on her PCP to make a referral to surgery, rather than contacting them herself, even though she has been an established patient. I used a curette to debride slough from both wound sites. We will continue topical gentamicin and mupirocin with silver alginate and 4-layer compression on the leg. Continue silver alginate on the abdomen. We will go ahead and send the referral to Dr. Maisie Fus at [facility] surgery to address the abdominal wound resulting from her ostomy reversal. Follow-up with me in 1 week. Electronic Signature(s) Signed: 11/07/2022 7:24:00 PM By: Shawn Stall RN, BSN Signed: 11/08/2022 7:42:58 AM By: [name] MD FACS Previous Signature: 11/05/2022 10:38:26 AM Version By: [name] MD FACS Entered By: Shawn Stall on 11/07/2022 19:20:04 -------------------------------------------------------------------------------- HxROS Details Patient Name: Date of Service: [name] 11/05/2022 9:30 A M Medical Record Number: [id number] Patient Account Number: [id number] Date of Birth/Sex: Treating RN: 12/10/1955 (67 y.o. F) Primary Care Provider: [name] Other Clinician: Referring Provider: Treating Provider/Extender: Jasmine Pang in  Treatment: 9 Information Obtained From Patient Constitutional Symptoms (General Health) Medical History: Past Medical History Notes: morbid obesity Eyes Medical History: Positive for: SHELBYE, IGO ([id number]) [id number] Page 15 of 16 Hematologic/Lymphatic Medical History: Positive for: Lymphedema Cardiovascular Medical History: Positive for: Hypertension; Peripheral Venous Disease Past Medical History Notes: schamberg disease, hyperlipidemia Gastrointestinal Medical History: Past Medical History Notes: diverticulitis , h/o obstruction due to diverticulitis , colostomy and colostomy reversal Endocrine Medical History: Past Medical History Notes: hypothyroidism Integumentary (Skin) Medical History: Negative for: History of Burn Musculoskeletal Medical History: Positive for: Osteoarthritis HBO Extended History Items Eyes: Cataracts Immunizations Pneumococcal Vaccine: Received Pneumococcal Vaccination: Yes Received Pneumococcal Vaccination On or After 60th Birthday: Yes Implantable Devices None Hospitalization / Surgery History Type of Hospitalization/Surgery colostomy reversal 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 Family and Social History Cancer: No; Diabetes: No; Heart Disease: Yes - Mother,Father; Hereditary Spherocytosis: No; Hypertension: Yes - Mother,Father; Kidney Disease: Yes - Mother; Lung Disease: No; Seizures: No;  Ag+ Alginate Dressing, 2x2 (in/in) 1 x Per Day/30 Days ary Discharge Instructions: Apply to wound bed as instructed Secondary Dressing: ABD Pad, 8x10 1 x Per Day/30 Days Discharge Instructions: Apply over primary dressing as directed. Secured With: 82M Medipore Scientist, research (life sciences) Surgical T 2x10 (in/yd) 1 x Per Day/30 Days ape Discharge Instructions: Secure with tape as directed. WOUND #9: - Lower Leg Wound Laterality: Left, Anterior, Distal Cleanser: Vashe 5.8 (oz) 1 x Per Week/30 Days Discharge Instructions: Cleanse the wound with Vashe prior to applying a clean dressing using gauze sponges, not tissue or cotton balls. Topical: Gentamicin 1 x Per Week/30 Days Discharge Instructions: As directed by physician Topical: Mupirocin Ointment 1 x Per Week/30 Days Discharge Instructions: Apply Mupirocin (Bactroban) as instructed Prim Dressing: Maxorb Extra Ag+ Alginate Dressing, 4x4.75 (in/in) 1 x Per Week/30 Days ary Discharge Instructions: Apply to wound bed as  instructed Secondary Dressing: ABD Pad, 8x10 1 x Per Week/30 Days Discharge Instructions: Apply over primary dressing as directed. Com pression Wrap: FourPress (4 layer compression wrap) 1 x Per Week/30 Days Discharge Instructions: Apply four layer compression as directed. May also use Urgo K2 compression system as alternative. Com pression Wrap: Unnaboot w/Calamine, 4x10 (in/yd) 1 x Per Week/30 Days Discharge Instructions: Apply Unnaboot as directed. 11/05/2022: The satellite wound that was new last week is closed. The initial leg wound is smaller and more superficial with minimal slough on the surface. The old ostomy site is stable with some slough present. Apparently the patient has been waiting on her PCP to make a referral to surgery, rather than contacting them herself, even though she has been an established patient. I used a curette to debride slough from both wound sites. We will continue topical gentamicin and mupirocin with silver alginate and 4-layer compression on the leg. Continue silver alginate on the abdomen. We will go ahead and send the referral to Dr. Maisie Fus at [facility] surgery to address the abdominal wound resulting from her ostomy reversal. Follow-up with me in 1 week. Electronic Signature(s) Signed: 11/07/2022 7:24:00 PM By: Shawn Stall RN, BSN Signed: 11/08/2022 7:42:58 AM By: [name] MD FACS Previous Signature: 11/05/2022 10:38:26 AM Version By: [name] MD FACS Entered By: Shawn Stall on 11/07/2022 19:20:04 -------------------------------------------------------------------------------- HxROS Details Patient Name: Date of Service: [name] 11/05/2022 9:30 A M Medical Record Number: [id number] Patient Account Number: [id number] Date of Birth/Sex: Treating RN: 12/10/1955 (67 y.o. F) Primary Care Provider: [name] Other Clinician: Referring Provider: Treating Provider/Extender: Jasmine Pang in  Treatment: 9 Information Obtained From Patient Constitutional Symptoms (General Health) Medical History: Past Medical History Notes: morbid obesity Eyes Medical History: Positive for: SHELBYE, IGO ([id number]) [id number] Page 15 of 16 Hematologic/Lymphatic Medical History: Positive for: Lymphedema Cardiovascular Medical History: Positive for: Hypertension; Peripheral Venous Disease Past Medical History Notes: schamberg disease, hyperlipidemia Gastrointestinal Medical History: Past Medical History Notes: diverticulitis , h/o obstruction due to diverticulitis , colostomy and colostomy reversal Endocrine Medical History: Past Medical History Notes: hypothyroidism Integumentary (Skin) Medical History: Negative for: History of Burn Musculoskeletal Medical History: Positive for: Osteoarthritis HBO Extended History Items Eyes: Cataracts Immunizations Pneumococcal Vaccine: Received Pneumococcal Vaccination: Yes Received Pneumococcal Vaccination On or After 60th Birthday: Yes Implantable Devices None Hospitalization / Surgery History Type of Hospitalization/Surgery colostomy reversal 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 Family and Social History Cancer: No; Diabetes: No; Heart Disease: Yes - Mother,Father; Hereditary Spherocytosis: No; Hypertension: Yes - Mother,Father; Kidney Disease: Yes - Mother; Lung Disease: No; Seizures: No;  Date of Service: [name], [name] 11/05/2022 9:30 A M Medical Record Number: [id number] Patient Account Number: [id number] Date of Birth/Sex: Treating RN: Jul 12, 1955 (67 y.o. 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 She tells [name] that she fell on the gravel driveway in July. She developed injuries on her distal lower leg which have not healed. She saw her primary physician on 11/15/2017 who noted her left shin injuries. Gave her antibiotics. At that point the wounds were almost circumferential however most were less than 1.5 cm. Weeping edema fluid was noted. She was referred here for evaluation. The patient has a history of chronic lower extremity edema. 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  She has had prior history with leg weeping fluid. She does not wear compression stockings. She is not doing anything specific to these wound areas. 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 She also has lymphedema. Put her in 3 layer compression and silver alginate last week. She has 3 small wounds with depth just lateral to the tibia. More problematically than this she has numerous shallow areas some of which are almost canal like in shape with tightly adherent painful debris. It would be very difficult and time- consuming to go through this and attempt to individually debride all these areas.. I changed her to collagen today to see if that would help with any of the surface debris on some of these wounds. Otherwise we will not be able to put this in compression we have to have someone change the dressing. The patient is not eligible for home health 12/25/17 on evaluation today patient actually appears to be doing rather well in regard to the ulcer on her lower extremity. Fortunately there does not appear to be evidence of infection at this time. She has been tolerating the dressing changes without complication. This includes the compression wrap. The only issue she had was that the wrap was initially placed over her bunion region which actually calls her some discomfort and pain. Other than that things seem to be going rather well. 01/01/2018 Seen today for follow-up and management of left lower extremity wound and lymphedema. T oday she presents with a new wound towards to the left lateral LE. Recently treated with a 7 day course of amoxicillin; reason for antibiotic dose is unknown at this time. Tolerating current  treatment of collagen with 4- layer wraps. She obtained a venous reflux study on 12/25/17. Studies show on the right abnormal reflux times of the popliteal vein, great saphenous vein at the saphenofemoral junction at the proximal thigh, great saphenous vein at the mid calf, and origin of the small saphenous vein.No superficial thrombosis. No deep vein thrombosis in the common femoral, femoral,and popliteal veins. Left abnormal reflex times as well of the common femoral vein, popliteal vein, and a great saphenous vein at the saphenofemoral junction, In great saphenous vein at the mid thigh w/o thrombosis. Has any issues or concerns during visit today.  Stroke: No; Thyroid Problems: Yes - Mother; Tuberculosis: No; Never smoker; Marital Status - Single; Alcohol Use: Never; Drug Use: No History; Caffeine Use: Daily - coffee; Financial Concerns: No; Food, Clothing or Shelter Needs: No; Support System Lacking: No; Transportation Concerns: No Electronic Signature(s) Signed: 11/05/2022 10:40:03 AM By: [name] MD FACS Entered By: [name] on 11/05/2022  10:36:38 -------------------------------------------------------------------------------- SuperBill Details Patient Name: Date of Service: [name] 11/05/2022 Medical Record Number: [id number] Patient Account Number: [id number] Date of Birth/Sex: Treating RN: 01-12-1956 (67 y.o. 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 Facility Procedures : CPT4 Code: [id number] Description: 97597 - DEBRIDE WOUND 1ST 20 SQ CM OR < ICD-10 Diagnosis Description L97.928 Non-pressure chronic ulcer of unspecified part of left lower leg with other specif 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 Modifier: ied severity Quantity: 1 Physician Procedures : CPT4 Code Description Modifier [id number] 99214 - WC PHYS LEVEL 4 - EST PT 25 ICD-10 Diagnosis Description L97.928 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 L98.499 Non-pressure chronic ulcer of skin of  other sites with unspecified severity 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 Quantity: 1 : [id number] 97597 - WC PHYS DEBR WO ANESTH 20 SQ CM ICD-10 Diagnosis Description L97.928 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 L98.499 Non-pressure chronic ulcer of skin of other sites with  unspecified severity Quantity: 1 Electronic Signature(s) Signed:  11/05/2022 10:38:45 AM By: [name] MD FACS Entered By: [name] on 11/05/2022 10:38:45  Date of Service: [name], [name] 11/05/2022 9:30 A M Medical Record Number: [id number] Patient Account Number: [id number] Date of Birth/Sex: Treating RN: Jul 12, 1955 (67 y.o. 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 She tells [name] that she fell on the gravel driveway in July. She developed injuries on her distal lower leg which have not healed. She saw her primary physician on 11/15/2017 who noted her left shin injuries. Gave her antibiotics. At that point the wounds were almost circumferential however most were less than 1.5 cm. Weeping edema fluid was noted. She was referred here for evaluation. The patient has a history of chronic lower extremity edema. 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  She has had prior history with leg weeping fluid. She does not wear compression stockings. She is not doing anything specific to these wound areas. 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 She also has lymphedema. Put her in 3 layer compression and silver alginate last week. She has 3 small wounds with depth just lateral to the tibia. More problematically than this she has numerous shallow areas some of which are almost canal like in shape with tightly adherent painful debris. It would be very difficult and time- consuming to go through this and attempt to individually debride all these areas.. I changed her to collagen today to see if that would help with any of the surface debris on some of these wounds. Otherwise we will not be able to put this in compression we have to have someone change the dressing. The patient is not eligible for home health 12/25/17 on evaluation today patient actually appears to be doing rather well in regard to the ulcer on her lower extremity. Fortunately there does not appear to be evidence of infection at this time. She has been tolerating the dressing changes without complication. This includes the compression wrap. The only issue she had was that the wrap was initially placed over her bunion region which actually calls her some discomfort and pain. Other than that things seem to be going rather well. 01/01/2018 Seen today for follow-up and management of left lower extremity wound and lymphedema. T oday she presents with a new wound towards to the left lateral LE. Recently treated with a 7 day course of amoxicillin; reason for antibiotic dose is unknown at this time. Tolerating current  treatment of collagen with 4- layer wraps. She obtained a venous reflux study on 12/25/17. Studies show on the right abnormal reflux times of the popliteal vein, great saphenous vein at the saphenofemoral junction at the proximal thigh, great saphenous vein at the mid calf, and origin of the small saphenous vein.No superficial thrombosis. No deep vein thrombosis in the common femoral, femoral,and popliteal veins. Left abnormal reflex times as well of the common femoral vein, popliteal vein, and a great saphenous vein at the saphenofemoral junction, In great saphenous vein at the mid thigh w/o thrombosis. Has any issues or concerns during visit today.  Stroke: No; Thyroid Problems: Yes - Mother; Tuberculosis: No; Never smoker; Marital Status - Single; Alcohol Use: Never; Drug Use: No History; Caffeine Use: Daily - coffee; Financial Concerns: No; Food, Clothing or Shelter Needs: No; Support System Lacking: No; Transportation Concerns: No Electronic Signature(s) Signed: 11/05/2022 10:40:03 AM By: [name] MD FACS Entered By: [name] on 11/05/2022  10:36:38 -------------------------------------------------------------------------------- SuperBill Details Patient Name: Date of Service: [name] 11/05/2022 Medical Record Number: [id number] Patient Account Number: [id number] Date of Birth/Sex: Treating RN: 01-12-1956 (67 y.o. 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 Facility Procedures : CPT4 Code: [id number] Description: 97597 - DEBRIDE WOUND 1ST 20 SQ CM OR < ICD-10 Diagnosis Description L97.928 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 with other specif 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 Modifier: ied severity Quantity: 1 Physician Procedures : CPT4 Code Description Modifier [id number] - WC PHYS LEVEL 4 - EST PT 25 ICD-10 Diagnosis Description L97.928 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 L98.499 Non-pressure chronic ulcer of skin of  other sites with unspecified severity 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 Quantity: 1 : [id number] - WC PHYS DEBR WO ANESTH 20 SQ CM ICD-10 Diagnosis Description L97.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 Secured With: 82M Medipore Scientist, research (life sciences) Surgical T 2x10 (in/yd) 1 x Per Day/30 Days ape Discharge Instructions: Secure with tape as directed. WOUND #9: - Lower Leg Wound Laterality: Left, Anterior, Distal Cleanser: Vashe 5.8 (oz) 1 x Per Week/30 Days Discharge Instructions: Cleanse the wound with Vashe prior to applying a clean dressing using gauze sponges, not tissue or cotton balls. Topical: Gentamicin 1 x Per Week/30 Days Discharge Instructions: As directed by physician Topical: Mupirocin Ointment 1 x Per Week/30 Days Discharge Instructions: Apply Mupirocin (Bactroban) as instructed Prim Dressing: Maxorb Extra Ag+ Alginate Dressing, 4x4.75 (in/in) 1 x Per Week/30 Days ary Discharge Instructions: Apply to wound bed as  instructed Secondary Dressing: ABD Pad, 8x10 1 x Per Week/30 Days Discharge Instructions: Apply over primary dressing as directed. Com pression Wrap: FourPress (4 layer compression wrap) 1 x Per Week/30 Days Discharge Instructions: Apply four layer compression as directed. May also use Urgo K2 compression system as alternative. Com pression Wrap: Unnaboot w/Calamine, 4x10 (in/yd) 1 x Per Week/30 Days Discharge Instructions: Apply Unnaboot as directed. 11/05/2022: The satellite wound that was new last week is closed. The initial leg wound is smaller and more superficial with minimal slough on the surface. The old ostomy site is stable with some slough present. Apparently the patient has been waiting on her PCP to make a referral to surgery, rather than contacting them herself, even though she has been an established patient. I used a curette to debride slough from both wound sites. We will continue topical gentamicin and mupirocin with silver alginate and 4-layer compression on the leg. Continue silver alginate on the abdomen. We will go ahead and send the referral to Dr. Maisie Fus at [facility] surgery to address the abdominal wound resulting from her ostomy reversal. Follow-up with me in 1 week. Electronic Signature(s) Signed: 11/07/2022 7:24:00 PM By: Shawn Stall RN, BSN Signed: 11/08/2022 7:42:58 AM By: [name] MD FACS Previous Signature: 11/05/2022 10:38:26 AM Version By: [name] MD FACS Entered By: Shawn Stall on 11/07/2022 19:20:04 -------------------------------------------------------------------------------- HxROS Details Patient Name: Date of Service: [name] 11/05/2022 9:30 A M Medical Record Number: [id number] Patient Account Number: [id number] Date of Birth/Sex: Treating RN: 12/10/1955 (67 y.o. F) Primary Care Provider: [name] Other Clinician: Referring Provider: Treating Provider/Extender: Jasmine Pang in  Treatment: 9 Information Obtained From Patient Constitutional Symptoms (General Health) Medical History: Past Medical History Notes: morbid obesity Eyes Medical History: Positive for: SHELBYE, IGO ([id number]) [id number] Page 15 of 16 Hematologic/Lymphatic Medical History: Positive for: Lymphedema Cardiovascular Medical History: Positive for: Hypertension; Peripheral Venous Disease Past Medical History Notes: schamberg disease, hyperlipidemia Gastrointestinal Medical History: Past Medical History Notes: diverticulitis , h/o obstruction due to diverticulitis , colostomy and colostomy reversal Endocrine Medical History: Past Medical History Notes: hypothyroidism Integumentary (Skin) Medical History: Negative for: History of Burn Musculoskeletal Medical History: Positive for: Osteoarthritis HBO Extended History Items Eyes: Cataracts Immunizations Pneumococcal Vaccine: Received Pneumococcal Vaccination: Yes Received Pneumococcal Vaccination On or After 60th Birthday: Yes Implantable Devices None Hospitalization / Surgery History Type of Hospitalization/Surgery colostomy reversal 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 Family and Social History Cancer: No; Diabetes: No; Heart Disease: Yes - Mother,Father; Hereditary Spherocytosis: No; Hypertension: Yes - Mother,Father; Kidney Disease: Yes - Mother; Lung Disease: No; Seizures: No;  Date of Service: [name], [name] 11/05/2022 9:30 A M Medical Record Number: [id number] Patient Account Number: [id number] Date of Birth/Sex: Treating RN: Jul 12, 1955 (67 y.o. 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 She tells [name] that she fell on the gravel driveway in July. She developed injuries on her distal lower leg which have not healed. She saw her primary physician on 11/15/2017 who noted her left shin injuries. Gave her antibiotics. At that point the wounds were almost circumferential however most were less than 1.5 cm. Weeping edema fluid was noted. She was referred here for evaluation. The patient has a history of chronic lower extremity edema. 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  She has had prior history with leg weeping fluid. She does not wear compression stockings. She is not doing anything specific to these wound areas. 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 She also has lymphedema. Put her in 3 layer compression and silver alginate last week. She has 3 small wounds with depth just lateral to the tibia. More problematically than this she has numerous shallow areas some of which are almost canal like in shape with tightly adherent painful debris. It would be very difficult and time- consuming to go through this and attempt to individually debride all these areas.. I changed her to collagen today to see if that would help with any of the surface debris on some of these wounds. Otherwise we will not be able to put this in compression we have to have someone change the dressing. The patient is not eligible for home health 12/25/17 on evaluation today patient actually appears to be doing rather well in regard to the ulcer on her lower extremity. Fortunately there does not appear to be evidence of infection at this time. She has been tolerating the dressing changes without complication. This includes the compression wrap. The only issue she had was that the wrap was initially placed over her bunion region which actually calls her some discomfort and pain. Other than that things seem to be going rather well. 01/01/2018 Seen today for follow-up and management of left lower extremity wound and lymphedema. T oday she presents with a new wound towards to the left lateral LE. Recently treated with a 7 day course of amoxicillin; reason for antibiotic dose is unknown at this time. Tolerating current  treatment of collagen with 4- layer wraps. She obtained a venous reflux study on 12/25/17. Studies show on the right abnormal reflux times of the popliteal vein, great saphenous vein at the saphenofemoral junction at the proximal thigh, great saphenous vein at the mid calf, and origin of the small saphenous vein.No superficial thrombosis. No deep vein thrombosis in the common femoral, femoral,and popliteal veins. Left abnormal reflex times as well of the common femoral vein, popliteal vein, and a great saphenous vein at the saphenofemoral junction, In great saphenous vein at the mid thigh w/o thrombosis. Has any issues or concerns during visit today.  Laterality: Left, Anterior, Distal Cleanser: Vashe 5.8 (oz) 1 x Per Week/30 Days Discharge Instructions: Cleanse the wound with Vashe prior to applying a clean dressing using gauze sponges, not tissue or cotton balls. Topical: Gentamicin 1 x Per Week/30 Days Discharge Instructions: As directed by physician Topical: Mupirocin Ointment 1 x Per Week/30 Days Discharge  Instructions: Apply Mupirocin (Bactroban) as instructed Prim Dressing: Maxorb Extra Ag+ Alginate Dressing, 4x4.75 (in/in) 1 x Per Week/30 Days ary Discharge Instructions: Apply to wound bed as instructed Secondary Dressing: ABD Pad, 8x10 1 x Per Week/30 Days Discharge Instructions: Apply over primary dressing as directed. Compression Wrap: FourPress (4 layer compression wrap) 1 x Per Week/30 Days Discharge Instructions: Apply four layer compression as directed. May also use Urgo K2 compression system as alternative. Compression Wrap: Unnaboot w/Calamine, 4x10 (in/yd) 1 x Per Week/30 Days Discharge Instructions: Apply Unnaboot as directed. 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 - (ICD10 L98.499 - Non-pressure [facility], Dr. 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 [name] MD Patient Name: Provider: January 25, 1956 [id number] Date of Birth: NPI#: [id number] Sex: DEA #: [phone] Phone #: License #: UPN: Patient Address: [address] [place] [facility] [place], [place] [address] 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 [facility] Surgery, Dr. Elease Hashimoto Signature: Date(s): Electronic Signature(s) Signed: 11/05/2022 10:39:46 AM By: [name] MD FACS Entered By: [name] on  11/05/2022 10:39:46 -------------------------------------------------------------------------------- Problem List Details Patient Name: Date of Service: [name] 11/05/2022 9:30 A M Medical Record Number: [id number] Patient Account Number: [id number] Date of Birth/Sex: Treating RN: December 12, 1955 (67 y.o. 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 11/05/2022 9:30 A M Medical Record Number: [id number] Patient Account Number: [id number] Date of Birth/Sex: Treating RN: January 07, 1956 (67 y.o. F) Primary Care Provider: [name] Other Clinician: Referring Provider: Treating Provider/Extender: Jasmine Pang in Treatment: 9 Subjective Chief Complaint Information obtained from Patient 04/19/2021: The patient is here for ongoing follow-up regarding 2 left lower extremity wounds. 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 She tells [name] that she fell on the gravel driveway in July. She developed injuries on her distal lower leg which have not healed. She saw  Stroke: No; Thyroid Problems: Yes - Mother; Tuberculosis: No; Never smoker; Marital Status - Single; Alcohol Use: Never; Drug Use: No History; Caffeine Use: Daily - coffee; Financial Concerns: No; Food, Clothing or Shelter Needs: No; Support System Lacking: No; Transportation Concerns: No Electronic Signature(s) Signed: 11/05/2022 10:40:03 AM By: [name] MD FACS Entered By: [name] on 11/05/2022  10:36:38 -------------------------------------------------------------------------------- SuperBill Details Patient Name: Date of Service: [name] 11/05/2022 Medical Record Number: [id number] Patient Account Number: [id number] Date of Birth/Sex: Treating RN: 01-12-1956 (67 y.o. 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 Facility Procedures : CPT4 Code: [id number] Description: 97597 - DEBRIDE WOUND 1ST 20 SQ CM OR < ICD-10 Diagnosis Description L97.928 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 with other specif 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 Modifier: ied severity Quantity: 1 Physician Procedures : CPT4 Code Description Modifier [id number] 99214 - WC PHYS LEVEL 4 - EST PT 25 ICD-10 Diagnosis Description L97.928 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 L98.499 Non-pressure chronic ulcer of skin of  other sites with unspecified severity 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 Quantity: 1 : [id number] 97597 - WC PHYS DEBR WO ANESTH 20 SQ CM ICD-10 Diagnosis Description L97.928 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 Topical: Gentamicin 1 x Per Week/30 Days Discharge Instructions: As directed by physician Topical: Mupirocin Ointment 1 x Per Week/30 Days Discharge  Instructions: Apply Mupirocin (Bactroban) as instructed Prim Dressing: Maxorb Extra Ag+ Alginate Dressing, 4x4.75 (in/in) 1 x Per Week/30 Days ary Discharge Instructions: Apply to wound bed as instructed Secondary Dressing: ABD Pad, 8x10 1 x Per Week/30 Days Discharge Instructions: Apply over primary dressing as directed. Compression Wrap: FourPress (4 layer compression wrap) 1 x Per Week/30 Days Discharge Instructions: Apply four layer compression as directed. May also use Urgo K2 compression system as alternative. Compression Wrap: Unnaboot w/Calamine, 4x10 (in/yd) 1 x Per Week/30 Days Discharge Instructions: Apply Unnaboot as directed. 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 - (ICD10 L98.499 - Non-pressure [facility], Dr. 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 [name] MD Patient Name: Provider: January 25, 1956 [id number] Date of Birth: NPI#: [id number] Sex: DEA #: [phone] Phone #: License #: UPN: Patient Address: [address] [place] [facility] [place], [place] [address] 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 [facility] Surgery, Dr. Elease Hashimoto Signature: Date(s): Electronic Signature(s) Signed: 11/05/2022 10:39:46 AM By: [name] MD FACS Entered By: [name] on  11/05/2022 10:39:46 -------------------------------------------------------------------------------- Problem List Details Patient Name: Date of Service: [name] 11/05/2022 9:30 A M Medical Record Number: [id number] Patient Account Number: [id number] Date of Birth/Sex: Treating RN: December 12, 1955 (67 y.o. 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 11/05/2022 9:30 A M Medical Record Number: [id number] Patient Account Number: [id number] Date of Birth/Sex: Treating RN: January 07, 1956 (67 y.o. F) Primary Care Provider: [name] Other Clinician: Referring Provider: Treating Provider/Extender: Jasmine Pang in Treatment: 9 Subjective Chief Complaint Information obtained from Patient 04/19/2021: The patient is here for ongoing follow-up regarding 2 left lower extremity wounds. 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 She tells [name] that she fell on the gravel driveway in July. She developed injuries on her distal lower leg which have not healed. She saw  Ag+ Alginate Dressing, 2x2 (in/in) 1 x Per Day/30 Days ary Discharge Instructions: Apply to wound bed as instructed Secondary Dressing: ABD Pad, 8x10 1 x Per Day/30 Days Discharge Instructions: Apply over primary dressing as directed. Secured With: 82M Medipore Scientist, research (life sciences) Surgical T 2x10 (in/yd) 1 x Per Day/30 Days ape Discharge Instructions: Secure with tape as directed. WOUND #9: - Lower Leg Wound Laterality: Left, Anterior, Distal Cleanser: Vashe 5.8 (oz) 1 x Per Week/30 Days Discharge Instructions: Cleanse the wound with Vashe prior to applying a clean dressing using gauze sponges, not tissue or cotton balls. Topical: Gentamicin 1 x Per Week/30 Days Discharge Instructions: As directed by physician Topical: Mupirocin Ointment 1 x Per Week/30 Days Discharge Instructions: Apply Mupirocin (Bactroban) as instructed Prim Dressing: Maxorb Extra Ag+ Alginate Dressing, 4x4.75 (in/in) 1 x Per Week/30 Days ary Discharge Instructions: Apply to wound bed as  instructed Secondary Dressing: ABD Pad, 8x10 1 x Per Week/30 Days Discharge Instructions: Apply over primary dressing as directed. Com pression Wrap: FourPress (4 layer compression wrap) 1 x Per Week/30 Days Discharge Instructions: Apply four layer compression as directed. May also use Urgo K2 compression system as alternative. Com pression Wrap: Unnaboot w/Calamine, 4x10 (in/yd) 1 x Per Week/30 Days Discharge Instructions: Apply Unnaboot as directed. 11/05/2022: The satellite wound that was new last week is closed. The initial leg wound is smaller and more superficial with minimal slough on the surface. The old ostomy site is stable with some slough present. Apparently the patient has been waiting on her PCP to make a referral to surgery, rather than contacting them herself, even though she has been an established patient. I used a curette to debride slough from both wound sites. We will continue topical gentamicin and mupirocin with silver alginate and 4-layer compression on the leg. Continue silver alginate on the abdomen. We will go ahead and send the referral to Dr. Maisie Fus at [facility] surgery to address the abdominal wound resulting from her ostomy reversal. Follow-up with me in 1 week. Electronic Signature(s) Signed: 11/07/2022 7:24:00 PM By: Shawn Stall RN, BSN Signed: 11/08/2022 7:42:58 AM By: [name] MD FACS Previous Signature: 11/05/2022 10:38:26 AM Version By: [name] MD FACS Entered By: Shawn Stall on 11/07/2022 19:20:04 -------------------------------------------------------------------------------- HxROS Details Patient Name: Date of Service: [name] 11/05/2022 9:30 A M Medical Record Number: [id number] Patient Account Number: [id number] Date of Birth/Sex: Treating RN: 12/10/1955 (67 y.o. F) Primary Care Provider: [name] Other Clinician: Referring Provider: Treating Provider/Extender: Jasmine Pang in  Treatment: 9 Information Obtained From Patient Constitutional Symptoms (General Health) Medical History: Past Medical History Notes: morbid obesity Eyes Medical History: Positive for: SHELBYE, IGO ([id number]) [id number] Page 15 of 16 Hematologic/Lymphatic Medical History: Positive for: Lymphedema Cardiovascular Medical History: Positive for: Hypertension; Peripheral Venous Disease Past Medical History Notes: schamberg disease, hyperlipidemia Gastrointestinal Medical History: Past Medical History Notes: diverticulitis , h/o obstruction due to diverticulitis , colostomy and colostomy reversal Endocrine Medical History: Past Medical History Notes: hypothyroidism Integumentary (Skin) Medical History: Negative for: History of Burn Musculoskeletal Medical History: Positive for: Osteoarthritis HBO Extended History Items Eyes: Cataracts Immunizations Pneumococcal Vaccine: Received Pneumococcal Vaccination: Yes Received Pneumococcal Vaccination On or After 60th Birthday: Yes Implantable Devices None Hospitalization / Surgery History Type of Hospitalization/Surgery colostomy reversal 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 Family and Social History Cancer: No; Diabetes: No; Heart Disease: Yes - Mother,Father; Hereditary Spherocytosis: No; Hypertension: Yes - Mother,Father; Kidney Disease: Yes - Mother; Lung Disease: No; Seizures: No;  Date of Service: [name], [name] 11/05/2022 9:30 A M Medical Record Number: [id number] Patient Account Number: [id number] Date of Birth/Sex: Treating RN: Jul 12, 1955 (67 y.o. 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 She tells [name] that she fell on the gravel driveway in July. She developed injuries on her distal lower leg which have not healed. She saw her primary physician on 11/15/2017 who noted her left shin injuries. Gave her antibiotics. At that point the wounds were almost circumferential however most were less than 1.5 cm. Weeping edema fluid was noted. She was referred here for evaluation. The patient has a history of chronic lower extremity edema. 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  She has had prior history with leg weeping fluid. She does not wear compression stockings. She is not doing anything specific to these wound areas. 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 She also has lymphedema. Put her in 3 layer compression and silver alginate last week. She has 3 small wounds with depth just lateral to the tibia. More problematically than this she has numerous shallow areas some of which are almost canal like in shape with tightly adherent painful debris. It would be very difficult and time- consuming to go through this and attempt to individually debride all these areas.. I changed her to collagen today to see if that would help with any of the surface debris on some of these wounds. Otherwise we will not be able to put this in compression we have to have someone change the dressing. The patient is not eligible for home health 12/25/17 on evaluation today patient actually appears to be doing rather well in regard to the ulcer on her lower extremity. Fortunately there does not appear to be evidence of infection at this time. She has been tolerating the dressing changes without complication. This includes the compression wrap. The only issue she had was that the wrap was initially placed over her bunion region which actually calls her some discomfort and pain. Other than that things seem to be going rather well. 01/01/2018 Seen today for follow-up and management of left lower extremity wound and lymphedema. T oday she presents with a new wound towards to the left lateral LE. Recently treated with a 7 day course of amoxicillin; reason for antibiotic dose is unknown at this time. Tolerating current  treatment of collagen with 4- layer wraps. She obtained a venous reflux study on 12/25/17. Studies show on the right abnormal reflux times of the popliteal vein, great saphenous vein at the saphenofemoral junction at the proximal thigh, great saphenous vein at the mid calf, and origin of the small saphenous vein.No superficial thrombosis. No deep vein thrombosis in the common femoral, femoral,and popliteal veins. Left abnormal reflex times as well of the common femoral vein, popliteal vein, and a great saphenous vein at the saphenofemoral junction, In great saphenous vein at the mid thigh w/o thrombosis. Has any issues or concerns during visit today.  Stroke: No; Thyroid Problems: Yes - Mother; Tuberculosis: No; Never smoker; Marital Status - Single; Alcohol Use: Never; Drug Use: No History; Caffeine Use: Daily - coffee; Financial Concerns: No; Food, Clothing or Shelter Needs: No; Support System Lacking: No; Transportation Concerns: No Electronic Signature(s) Signed: 11/05/2022 10:40:03 AM By: [name] MD FACS Entered By: [name] on 11/05/2022  10:36:38 -------------------------------------------------------------------------------- SuperBill Details Patient Name: Date of Service: [name] 11/05/2022 Medical Record Number: [id number] Patient Account Number: [id number] Date of Birth/Sex: Treating RN: 01-12-1956 (67 y.o. 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 Facility Procedures : CPT4 Code: 27253664 Description: 97597 - DEBRIDE WOUND 1ST 20 SQ CM OR < ICD-10 Diagnosis Description L97.928 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 with other specif 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 Modifier: ied severity Quantity: 1 Physician Procedures : CPT4 Code Description Modifier [id number] 99214 - WC PHYS LEVEL 4 - EST PT 25 ICD-10 Diagnosis Description L97.928 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 L98.499 Non-pressure chronic ulcer of skin of  other sites with unspecified severity 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 Quantity: 1 : [id number] 97597 - WC PHYS DEBR WO ANESTH 20 SQ CM ICD-10 Diagnosis Description L97.928 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 L98.499 Non-pressure chronic ulcer of skin of other sites with  unspecified severity Quantity: 1 Electronic Signature(s) Signed:  11/05/2022 10:38:45 AM By: [name] MD FACS Entered By: [name] on 11/05/2022 10:38:45  Stroke: No; Thyroid Problems: Yes - Mother; Tuberculosis: No; Never smoker; Marital Status - Single; Alcohol Use: Never; Drug Use: No History; Caffeine Use: Daily - coffee; Financial Concerns: No; Food, Clothing or Shelter Needs: No; Support System Lacking: No; Transportation Concerns: No Electronic Signature(s) Signed: 11/05/2022 10:40:03 AM By: [name] MD FACS Entered By: [name] on 11/05/2022  10:36:38 -------------------------------------------------------------------------------- SuperBill Details Patient Name: Date of Service: [name] 11/05/2022 Medical Record Number: [id number] Patient Account Number: [id number] Date of Birth/Sex: Treating RN: 01-12-1956 (67 y.o. 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 Facility Procedures : CPT4 Code: 27253664 Description: 97597 - DEBRIDE WOUND 1ST 20 SQ CM OR < ICD-10 Diagnosis Description L97.928 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 with other specif 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 Modifier: ied severity Quantity: 1 Physician Procedures : CPT4 Code Description Modifier [id number] 99214 - WC PHYS LEVEL 4 - EST PT 25 ICD-10 Diagnosis Description L97.928 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 L98.499 Non-pressure chronic ulcer of skin of  other sites with unspecified severity 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 Quantity: 1 : [id number] 97597 - WC PHYS DEBR WO ANESTH 20 SQ CM ICD-10 Diagnosis Description L97.928 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 L98.499 Non-pressure chronic ulcer of skin of other sites with  unspecified severity Quantity: 1 Electronic Signature(s) Signed:  11/05/2022 10:38:45 AM By: [name] MD FACS Entered By: [name] on 11/05/2022 10:38:45  Stroke: No; Thyroid Problems: Yes - Mother; Tuberculosis: No; Never smoker; Marital Status - Single; Alcohol Use: Never; Drug Use: No History; Caffeine Use: Daily - coffee; Financial Concerns: No; Food, Clothing or Shelter Needs: No; Support System Lacking: No; Transportation Concerns: No Electronic Signature(s) Signed: 11/05/2022 10:40:03 AM By: [name] MD FACS Entered By: [name] on 11/05/2022  10:36:38 -------------------------------------------------------------------------------- SuperBill Details Patient Name: Date of Service: [name] 11/05/2022 Medical Record Number: [id number] Patient Account Number: [id number] Date of Birth/Sex: Treating RN: 01-12-1956 (67 y.o. 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 Facility Procedures : CPT4 Code: [id number] Description: 97597 - DEBRIDE WOUND 1ST 20 SQ CM OR < ICD-10 Diagnosis Description L97.928 Non-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 with other specif 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 Modifier: ied severity Quantity: 1 Physician Procedures : CPT4 Code Description Modifier [id number] 99214 - WC PHYS LEVEL 4 - EST PT 25 ICD-10 Diagnosis Description L97.928 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 L98.499 Non-pressure chronic ulcer of skin of  other sites with unspecified severity 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 Quantity: 1 : [id number] 97597 - WC PHYS DEBR WO ANESTH 20 SQ CM ICD-10 Diagnosis Description L97.928 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 Topical: Gentamicin 1 x Per Week/30 Days Discharge Instructions: As directed by physician Topical: Mupirocin Ointment 1 x Per Week/30 Days Discharge  Instructions: Apply Mupirocin (Bactroban) as instructed Prim Dressing: Maxorb Extra Ag+ Alginate Dressing, 4x4.75 (in/in) 1 x Per Week/30 Days ary Discharge Instructions: Apply to wound bed as instructed Secondary Dressing: ABD Pad, 8x10 1 x Per Week/30 Days Discharge Instructions: Apply over primary dressing as directed. Compression Wrap: FourPress (4 layer compression wrap) 1 x Per Week/30 Days Discharge Instructions: Apply four layer compression as directed. May also use Urgo K2 compression system as alternative. Compression Wrap: Unnaboot w/Calamine, 4x10 (in/yd) 1 x Per Week/30 Days Discharge Instructions: Apply Unnaboot as directed. 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 - (ICD10 L98.499 - Non-pressure [facility], Dr. 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 [name] MD Patient Name: Provider: January 25, 1956 [id number] Date of Birth: NPI#: [id number] Sex: DEA #: [phone] Phone #: License #: UPN: Patient Address: [address] [place] Bridegroom [facility] [place], [place] [address] 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 [facility] Surgery, Dr. Elease Hashimoto Signature: Date(s): Electronic Signature(s) Signed: 11/05/2022 10:39:46 AM By: [name] MD FACS Entered By: [name] on  11/05/2022 10:39:46 -------------------------------------------------------------------------------- Problem List Details Patient Name: Date of Service: [name] 11/05/2022 9:30 A M Medical Record Number: [id number] Patient Account Number: [id number] Date of Birth/Sex: Treating RN: December 12, 1955 (67 y.o. 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 11/05/2022 9:30 A M Medical Record Number: [id number] Patient Account Number: [id number] Date of Birth/Sex: Treating RN: January 07, 1956 (67 y.o. F) Primary Care Provider: [name] Other Clinician: Referring Provider: Treating Provider/Extender: Jasmine Pang in Treatment: 9 Subjective Chief Complaint Information obtained from Patient 04/19/2021: The patient is here for ongoing follow-up regarding 2 left lower extremity wounds. 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 She tells [name] that she fell on the gravel driveway in July. She developed injuries on her distal lower leg which have not healed. She saw  Date of Service: [name], [name] 11/05/2022 9:30 A M Medical Record Number: [id number] Patient Account Number: [id number] Date of Birth/Sex: Treating RN: Jul 12, 1955 (67 y.o. 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 She tells [name] that she fell on the gravel driveway in July. She developed injuries on her distal lower leg which have not healed. She saw her primary physician on 11/15/2017 who noted her left shin injuries. Gave her antibiotics. At that point the wounds were almost circumferential however most were less than 1.5 cm. Weeping edema fluid was noted. She was referred here for evaluation. The patient has a history of chronic lower extremity edema. 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  She has had prior history with leg weeping fluid. She does not wear compression stockings. She is not doing anything specific to these wound areas. 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 She also has lymphedema. Put her in 3 layer compression and silver alginate last week. She has 3 small wounds with depth just lateral to the tibia. More problematically than this she has numerous shallow areas some of which are almost canal like in shape with tightly adherent painful debris. It would be very difficult and time- consuming to go through this and attempt to individually debride all these areas.. I changed her to collagen today to see if that would help with any of the surface debris on some of these wounds. Otherwise we will not be able to put this in compression we have to have someone change the dressing. The patient is not eligible for home health 12/25/17 on evaluation today patient actually appears to be doing rather well in regard to the ulcer on her lower extremity. Fortunately there does not appear to be evidence of infection at this time. She has been tolerating the dressing changes without complication. This includes the compression wrap. The only issue she had was that the wrap was initially placed over her bunion region which actually calls her some discomfort and pain. Other than that things seem to be going rather well. 01/01/2018 Seen today for follow-up and management of left lower extremity wound and lymphedema. T oday she presents with a new wound towards to the left lateral LE. Recently treated with a 7 day course of amoxicillin; reason for antibiotic dose is unknown at this time. Tolerating current  treatment of collagen with 4- layer wraps. She obtained a venous reflux study on 12/25/17. Studies show on the right abnormal reflux times of the popliteal vein, great saphenous vein at the saphenofemoral junction at the proximal thigh, great saphenous vein at the mid calf, and origin of the small saphenous vein.No superficial thrombosis. No deep vein thrombosis in the common femoral, femoral,and popliteal veins. Left abnormal reflex times as well of the common femoral vein, popliteal vein, and a great saphenous vein at the saphenofemoral junction, In great saphenous vein at the mid thigh w/o thrombosis. Has any issues or concerns during visit today.

## 2022-11-06 NOTE — Progress Notes (Signed)
Robyn Other Clinician: Referring Louine Tenpenny: Treating Deari Sessler/Extender: Jasmine Pang in Treatment: 9 Active Inactive Wound/Skin Impairment Nursing Diagnoses: Impaired tissue integrity Goals: Patient/caregiver will verbalize  understanding of skin care regimen Date Initiated: 09/03/2022 Target Resolution Date: 12/12/2022 Goal Status: Active Interventions: Assess patient/caregiver ability to obtain necessary supplies Assess patient/caregiver ability to perform ulcer/skin care regimen upon admission and as needed Assess ulceration(s) every visit Provide education on ulcer and skin care Screen for HBO Treatment Activities: Skin care regimen initiated : 08/31/2022 Topical wound management initiated : 08/31/2022 Notes: EMREIGH, BOTBYL ([id number]) [id number] Page 5 of 10 Electronic Signature(s) Signed: 11/05/2022 5:45:41 PM By: Karie Schwalbe RN Entered By: Karie Schwalbe on 11/05/2022 14:38:47 -------------------------------------------------------------------------------- Pain Assessment Details Patient Name: Date of Service: [name], GREENLIEF 11/05/2022 9:30 A M Medical Record Number: [id number] Patient Account Number: [id number] Date of Birth/Sex: Treating RN: 04/18/1955 (67 y.o. [name] Primary Care Manal Kreutzer: [name] Other Clinician: Referring Taylar Hartsough: Treating Ginelle Bays/Extender: Jasmine Pang in Treatment: 9 Active Problems Location of Pain Severity and Description of Pain Patient Has Paino No Site Locations Pain Management and Medication Current Pain Management: Electronic Signature(s) Signed: 11/05/2022 5:45:41 PM By: Karie Schwalbe RN Entered By: Karie Schwalbe on 11/05/2022 07:39:17 -------------------------------------------------------------------------------- Patient/Caregiver Education Details Patient Name: Date of Service: [name] 9/23/2024andnbsp9:30 A M Medical Record Number: [id number] Patient Account Number: [id number] Date of Birth/Gender: Treating RN: May 03, 1955 (67 y.o. [name] Primary Care Physician: [name] Other Clinician: Referring Physician: Treating Physician/Extender:  Jasmine Pang in Treatment: 9 Education Assessment Education Provided To: Patient [name], HOEK ([id number]) [id number] Page 6 of 10 Education Topics Provided Wound/Skin Impairment: Methods: Explain/Verbal Responses: State content correctly Electronic Signature(s) Signed: 11/05/2022 5:45:41 PM By: Karie Schwalbe RN Entered By: Karie Schwalbe on 11/05/2022 14:39:02 -------------------------------------------------------------------------------- Wound Assessment Details Patient Name: Date of Service: [name], DOMBROWSKI D. 11/05/2022 9:30 A M Medical Record Number: [id number] Patient Account Number: [id number] Date of Birth/Sex: Treating RN: 12-25-1955 (67 y.o. [name] Primary Care Borghild Thaker: [name] Other Clinician: Referring Robin Pafford: Treating Sumire Halbleib/Extender: Jasmine Pang in Treatment: 9 Wound Status Wound Number: 10 Primary Abscess Etiology: Wound Location: Left Abdomen - Lower Quadrant Wound Open Wounding Event: Surgical Injury Status: Date Acquired: 02/06/2017 Comorbid Cataracts, Lymphedema, Hypertension, Peripheral Venous Weeks Of Treatment: 4 History: Disease, Osteoarthritis Clustered Wound: No Photos Wound Measurements Length: (cm) 1.2 Width: (cm) 1.2 Depth: (cm) 0.4 Area: (cm) 1.131 Volume: (cm) 0.452 % Reduction in Area: 55.7% % Reduction in Volume: -77.3% Epithelialization: Small (1-33%) Tunneling: No Undermining: No Wound Description Classification: Full Thickness Without Exposed Suppor Exudate Amount: Medium Exudate Type: Serosanguineous Exudate Color: red, brown t Structures Foul Odor After Cleansing: No Slough/Fibrino Yes Wound Bed Granulation Amount: Large (67-100%) Exposed Structure Granulation Quality: Red, Hyper-granulation, Friable Fascia Exposed: No Necrotic Amount: Small (1-33%) Fat Layer (Subcutaneous Tissue) Exposed: Yes Necrotic Quality:  Adherent Slough Tendon Exposed: No Muscle Exposed: No Joint Exposed: No Bone Exposed: No [address] [name], JOHNCOX ([id number]) [id number] Page 7 of 10 Texture Color No Abnormalities Noted: No No Abnormalities Noted: Yes Scarring: Yes Temperature / Pain Temperature: No Abnormality Moisture No Abnormalities Noted: Yes Treatment Notes Wound #10 (Abdomen - Lower Quadrant) Wound Laterality: Left Cleanser Soap and Water Discharge Instruction: May shower and wash wound with dial antibacterial soap and water prior to dressing change. Vashe 5.8 (oz) Discharge Instruction: Cleanse the wound with Vashe prior to applying a clean dressing using gauze sponges,  5.8 (oz) Discharge Instruction: Cleanse the wound with Vashe prior to applying a clean dressing using gauze sponges, not tissue or cotton balls. Peri-Wound Care Topical Gentamicin Discharge Instruction: As directed by physician Mupirocin Ointment Discharge Instruction: Apply Mupirocin (Bactroban) as instructed Primary Dressing Maxorb Extra Ag+ Alginate Dressing, 4x4.75 (in/in) Discharge Instruction: Apply to wound bed as  instructed Secondary Dressing ABD Pad, 8x10 Discharge Instruction: Apply over primary dressing as directed. Secured With Compression Wrap FourPress (4 layer compression wrap) Discharge Instruction: Apply four layer compression as directed. May also use Urgo K2 compression system as alternative. Unnaboot w/Calamine, 4x10 (in/yd) Discharge Instruction: Apply Unnaboot as directed. BRITANEE, DESORBO ([id number]) [id number] Page 10 of 10 Compression Stockings Add-Ons Electronic Signature(s) Signed: 11/05/2022 5:45:41 PM By: Karie Schwalbe RN Entered By: Karie Schwalbe on 11/05/2022 07:18:20 -------------------------------------------------------------------------------- Vitals Details Patient Name: Date of Service: [name] 11/05/2022 9:30 A M Medical Record Number: [id number] Patient Account Number: [id number] Date of Birth/Sex: Treating RN: 02-Mar-1955 (67 y.o. [name] Primary Care Rinoa Garramone: [name] Other Clinician: Referring Julicia Krieger: Treating Shruti Arrey/Extender: Jasmine Pang in Treatment: 9 Vital Signs Time Taken: 10:00 Temperature (F): 97.8 Height (in): 62 Pulse (bpm): 76 Weight (lbs): 221 Respiratory Rate (breaths/min): 16 Body Mass Index (BMI): 40.4 Blood Pressure (mmHg): 136/78 Reference Range: 80 - 120 mg / dl Electronic Signature(s) Signed: 11/05/2022 5:45:41 PM By: Karie Schwalbe RN Entered By: Karie Schwalbe on 11/05/2022 07:39:11  5.8 (oz) Discharge Instruction: Cleanse the wound with Vashe prior to applying a clean dressing using gauze sponges, not tissue or cotton balls. Peri-Wound Care Topical Gentamicin Discharge Instruction: As directed by physician Mupirocin Ointment Discharge Instruction: Apply Mupirocin (Bactroban) as instructed Primary Dressing Maxorb Extra Ag+ Alginate Dressing, 4x4.75 (in/in) Discharge Instruction: Apply to wound bed as  instructed Secondary Dressing ABD Pad, 8x10 Discharge Instruction: Apply over primary dressing as directed. Secured With Compression Wrap FourPress (4 layer compression wrap) Discharge Instruction: Apply four layer compression as directed. May also use Urgo K2 compression system as alternative. Unnaboot w/Calamine, 4x10 (in/yd) Discharge Instruction: Apply Unnaboot as directed. BRITANEE, DESORBO ([id number]) [id number] Page 10 of 10 Compression Stockings Add-Ons Electronic Signature(s) Signed: 11/05/2022 5:45:41 PM By: Karie Schwalbe RN Entered By: Karie Schwalbe on 11/05/2022 07:18:20 -------------------------------------------------------------------------------- Vitals Details Patient Name: Date of Service: [name] 11/05/2022 9:30 A M Medical Record Number: [id number] Patient Account Number: [id number] Date of Birth/Sex: Treating RN: 02-Mar-1955 (67 y.o. [name] Primary Care Rinoa Garramone: [name] Other Clinician: Referring Julicia Krieger: Treating Shruti Arrey/Extender: Jasmine Pang in Treatment: 9 Vital Signs Time Taken: 10:00 Temperature (F): 97.8 Height (in): 62 Pulse (bpm): 76 Weight (lbs): 221 Respiratory Rate (breaths/min): 16 Body Mass Index (BMI): 40.4 Blood Pressure (mmHg): 136/78 Reference Range: 80 - 120 mg / dl Electronic Signature(s) Signed: 11/05/2022 5:45:41 PM By: Karie Schwalbe RN Entered By: Karie Schwalbe on 11/05/2022 07:39:11  Robyn Other Clinician: Referring Louine Tenpenny: Treating Deari Sessler/Extender: Jasmine Pang in Treatment: 9 Active Inactive Wound/Skin Impairment Nursing Diagnoses: Impaired tissue integrity Goals: Patient/caregiver will verbalize  understanding of skin care regimen Date Initiated: 09/03/2022 Target Resolution Date: 12/12/2022 Goal Status: Active Interventions: Assess patient/caregiver ability to obtain necessary supplies Assess patient/caregiver ability to perform ulcer/skin care regimen upon admission and as needed Assess ulceration(s) every visit Provide education on ulcer and skin care Screen for HBO Treatment Activities: Skin care regimen initiated : 08/31/2022 Topical wound management initiated : 08/31/2022 Notes: EMREIGH, BOTBYL ([id number]) [id number] Page 5 of 10 Electronic Signature(s) Signed: 11/05/2022 5:45:41 PM By: Karie Schwalbe RN Entered By: Karie Schwalbe on 11/05/2022 14:38:47 -------------------------------------------------------------------------------- Pain Assessment Details Patient Name: Date of Service: [name], GREENLIEF 11/05/2022 9:30 A M Medical Record Number: [id number] Patient Account Number: [id number] Date of Birth/Sex: Treating RN: 04/18/1955 (67 y.o. [name] Primary Care Manal Kreutzer: [name] Other Clinician: Referring Taylar Hartsough: Treating Ginelle Bays/Extender: Jasmine Pang in Treatment: 9 Active Problems Location of Pain Severity and Description of Pain Patient Has Paino No Site Locations Pain Management and Medication Current Pain Management: Electronic Signature(s) Signed: 11/05/2022 5:45:41 PM By: Karie Schwalbe RN Entered By: Karie Schwalbe on 11/05/2022 07:39:17 -------------------------------------------------------------------------------- Patient/Caregiver Education Details Patient Name: Date of Service: [name] 9/23/2024andnbsp9:30 A M Medical Record Number: [id number] Patient Account Number: [id number] Date of Birth/Gender: Treating RN: May 03, 1955 (67 y.o. [name] Primary Care Physician: [name] Other Clinician: Referring Physician: Treating Physician/Extender:  Jasmine Pang in Treatment: 9 Education Assessment Education Provided To: Patient [name], HOEK ([id number]) [id number] Page 6 of 10 Education Topics Provided Wound/Skin Impairment: Methods: Explain/Verbal Responses: State content correctly Electronic Signature(s) Signed: 11/05/2022 5:45:41 PM By: Karie Schwalbe RN Entered By: Karie Schwalbe on 11/05/2022 14:39:02 -------------------------------------------------------------------------------- Wound Assessment Details Patient Name: Date of Service: [name], DOMBROWSKI D. 11/05/2022 9:30 A M Medical Record Number: [id number] Patient Account Number: [id number] Date of Birth/Sex: Treating RN: 12-25-1955 (67 y.o. [name] Primary Care Borghild Thaker: [name] Other Clinician: Referring Robin Pafford: Treating Sumire Halbleib/Extender: Jasmine Pang in Treatment: 9 Wound Status Wound Number: 10 Primary Abscess Etiology: Wound Location: Left Abdomen - Lower Quadrant Wound Open Wounding Event: Surgical Injury Status: Date Acquired: 02/06/2017 Comorbid Cataracts, Lymphedema, Hypertension, Peripheral Venous Weeks Of Treatment: 4 History: Disease, Osteoarthritis Clustered Wound: No Photos Wound Measurements Length: (cm) 1.2 Width: (cm) 1.2 Depth: (cm) 0.4 Area: (cm) 1.131 Volume: (cm) 0.452 % Reduction in Area: 55.7% % Reduction in Volume: -77.3% Epithelialization: Small (1-33%) Tunneling: No Undermining: No Wound Description Classification: Full Thickness Without Exposed Suppor Exudate Amount: Medium Exudate Type: Serosanguineous Exudate Color: red, brown t Structures Foul Odor After Cleansing: No Slough/Fibrino Yes Wound Bed Granulation Amount: Large (67-100%) Exposed Structure Granulation Quality: Red, Hyper-granulation, Friable Fascia Exposed: No Necrotic Amount: Small (1-33%) Fat Layer (Subcutaneous Tissue) Exposed: Yes Necrotic Quality:  Adherent Slough Tendon Exposed: No Muscle Exposed: No Joint Exposed: No Bone Exposed: No [address] [name], JOHNCOX ([id number]) [id number] Page 7 of 10 Texture Color No Abnormalities Noted: No No Abnormalities Noted: Yes Scarring: Yes Temperature / Pain Temperature: No Abnormality Moisture No Abnormalities Noted: Yes Treatment Notes Wound #10 (Abdomen - Lower Quadrant) Wound Laterality: Left Cleanser Soap and Water Discharge Instruction: May shower and wash wound with dial antibacterial soap and water prior to dressing change. Vashe 5.8 (oz) Discharge Instruction: Cleanse the wound with Vashe prior to applying a clean dressing using gauze sponges,  Robyn Other Clinician: Referring Louine Tenpenny: Treating Deari Sessler/Extender: Jasmine Pang in Treatment: 9 Active Inactive Wound/Skin Impairment Nursing Diagnoses: Impaired tissue integrity Goals: Patient/caregiver will verbalize  understanding of skin care regimen Date Initiated: 09/03/2022 Target Resolution Date: 12/12/2022 Goal Status: Active Interventions: Assess patient/caregiver ability to obtain necessary supplies Assess patient/caregiver ability to perform ulcer/skin care regimen upon admission and as needed Assess ulceration(s) every visit Provide education on ulcer and skin care Screen for HBO Treatment Activities: Skin care regimen initiated : 08/31/2022 Topical wound management initiated : 08/31/2022 Notes: EMREIGH, BOTBYL ([id number]) [id number] Page 5 of 10 Electronic Signature(s) Signed: 11/05/2022 5:45:41 PM By: Karie Schwalbe RN Entered By: Karie Schwalbe on 11/05/2022 14:38:47 -------------------------------------------------------------------------------- Pain Assessment Details Patient Name: Date of Service: [name], GREENLIEF 11/05/2022 9:30 A M Medical Record Number: [id number] Patient Account Number: [id number] Date of Birth/Sex: Treating RN: 04/18/1955 (67 y.o. [name] Primary Care Manal Kreutzer: [name] Other Clinician: Referring Taylar Hartsough: Treating Ginelle Bays/Extender: Jasmine Pang in Treatment: 9 Active Problems Location of Pain Severity and Description of Pain Patient Has Paino No Site Locations Pain Management and Medication Current Pain Management: Electronic Signature(s) Signed: 11/05/2022 5:45:41 PM By: Karie Schwalbe RN Entered By: Karie Schwalbe on 11/05/2022 07:39:17 -------------------------------------------------------------------------------- Patient/Caregiver Education Details Patient Name: Date of Service: [name] 9/23/2024andnbsp9:30 A M Medical Record Number: [id number] Patient Account Number: [id number] Date of Birth/Gender: Treating RN: May 03, 1955 (67 y.o. [name] Primary Care Physician: [name] Other Clinician: Referring Physician: Treating Physician/Extender:  Jasmine Pang in Treatment: 9 Education Assessment Education Provided To: Patient [name], HOEK ([id number]) [id number] Page 6 of 10 Education Topics Provided Wound/Skin Impairment: Methods: Explain/Verbal Responses: State content correctly Electronic Signature(s) Signed: 11/05/2022 5:45:41 PM By: Karie Schwalbe RN Entered By: Karie Schwalbe on 11/05/2022 14:39:02 -------------------------------------------------------------------------------- Wound Assessment Details Patient Name: Date of Service: [name], DOMBROWSKI D. 11/05/2022 9:30 A M Medical Record Number: [id number] Patient Account Number: [id number] Date of Birth/Sex: Treating RN: 12-25-1955 (67 y.o. [name] Primary Care Borghild Thaker: [name] Other Clinician: Referring Robin Pafford: Treating Sumire Halbleib/Extender: Jasmine Pang in Treatment: 9 Wound Status Wound Number: 10 Primary Abscess Etiology: Wound Location: Left Abdomen - Lower Quadrant Wound Open Wounding Event: Surgical Injury Status: Date Acquired: 02/06/2017 Comorbid Cataracts, Lymphedema, Hypertension, Peripheral Venous Weeks Of Treatment: 4 History: Disease, Osteoarthritis Clustered Wound: No Photos Wound Measurements Length: (cm) 1.2 Width: (cm) 1.2 Depth: (cm) 0.4 Area: (cm) 1.131 Volume: (cm) 0.452 % Reduction in Area: 55.7% % Reduction in Volume: -77.3% Epithelialization: Small (1-33%) Tunneling: No Undermining: No Wound Description Classification: Full Thickness Without Exposed Suppor Exudate Amount: Medium Exudate Type: Serosanguineous Exudate Color: red, brown t Structures Foul Odor After Cleansing: No Slough/Fibrino Yes Wound Bed Granulation Amount: Large (67-100%) Exposed Structure Granulation Quality: Red, Hyper-granulation, Friable Fascia Exposed: No Necrotic Amount: Small (1-33%) Fat Layer (Subcutaneous Tissue) Exposed: Yes Necrotic Quality:  Adherent Slough Tendon Exposed: No Muscle Exposed: No Joint Exposed: No Bone Exposed: No [address] [name], JOHNCOX ([id number]) [id number] Page 7 of 10 Texture Color No Abnormalities Noted: No No Abnormalities Noted: Yes Scarring: Yes Temperature / Pain Temperature: No Abnormality Moisture No Abnormalities Noted: Yes Treatment Notes Wound #10 (Abdomen - Lower Quadrant) Wound Laterality: Left Cleanser Soap and Water Discharge Instruction: May shower and wash wound with dial antibacterial soap and water prior to dressing change. Vashe 5.8 (oz) Discharge Instruction: Cleanse the wound with Vashe prior to applying a clean dressing using gauze sponges,

## 2022-11-12 ENCOUNTER — Encounter (HOSPITAL_BASED_OUTPATIENT_CLINIC_OR_DEPARTMENT_OTHER): Payer: Commercial Managed Care - PPO | Admitting: General Surgery

## 2022-11-12 DIAGNOSIS — M1990 Unspecified osteoarthritis, unspecified site: Secondary | ICD-10-CM | POA: Diagnosis not present

## 2022-11-12 DIAGNOSIS — E785 Hyperlipidemia, unspecified: Secondary | ICD-10-CM | POA: Diagnosis not present

## 2022-11-12 DIAGNOSIS — I10 Essential (primary) hypertension: Secondary | ICD-10-CM | POA: Diagnosis not present

## 2022-11-12 DIAGNOSIS — I890 Lymphedema, not elsewhere classified: Secondary | ICD-10-CM | POA: Diagnosis not present

## 2022-11-12 DIAGNOSIS — I872 Venous insufficiency (chronic) (peripheral): Secondary | ICD-10-CM | POA: Diagnosis not present

## 2022-11-12 DIAGNOSIS — I776 Arteritis, unspecified: Secondary | ICD-10-CM | POA: Diagnosis not present

## 2022-11-12 DIAGNOSIS — L97928 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: Secondary | ICD-10-CM | POA: Diagnosis not present

## 2022-11-12 DIAGNOSIS — L958 Other vasculitis limited to the skin: Secondary | ICD-10-CM | POA: Diagnosis not present

## 2022-11-12 DIAGNOSIS — I739 Peripheral vascular disease, unspecified: Secondary | ICD-10-CM | POA: Diagnosis not present

## 2022-11-12 DIAGNOSIS — E039 Hypothyroidism, unspecified: Secondary | ICD-10-CM | POA: Diagnosis not present

## 2022-11-12 NOTE — Progress Notes (Signed)
small distal cluster of wounds has closed. She has a new wound proximal to the main wound. It is superficial and limited to breakdown of skin and appears secondary to friction. The main wound has a fibrotic surface with slough accumulation. Edema control is acceptable. 09/17/2022: The proximal wound has eschar on the surface. Underneath, there is a little bit of slough. The fibrotic surface of the main wound has not really improved, although there is some granulation tissue in the center. Edema control is good. 10/01/2022: The proximal wound is almost completely closed with just a glisten under illumination suggesting that it is not completely healed. The more distal wound is smaller by about half a centimeter. There  is slough accumulation on the surface and it is a little bit less fibrotic today. 10/08/2022: The proximal wound is healed. The more distal wound is smaller. There is some slough accumulation and a little bit of eschar around the edges. Today, the patient pointed out a wound on her left lower abdomen that has been present since a colostomy reversal in 2018. She has not ever discussed this with the surgeon that performed her ostomy takedown and this is the first time she has mentioned it to me. The tissue was very friable and apparently her PCP just started her on Augmentin, although I do not see any obvious signs of infection. 10/16/2022: The leg wound is smaller again today and more superficial. It is much cleaner with very little slough on the surface. The old ostomy site still has hypertrophic granulation tissue present. She is awaiting approval from insurance to see Dr. Maisie Fus, the surgeon that performed her ostomy reversal. 10/22/2022: The leg wound continues to contract. There is minimal slough on the surface. Edema control is good. The old ostomy site looks about the same, but the hypertrophic granulation tissue is less prominent today. 10/29/2022: She has a new leg wound just proximal and lateral to the existing site. There is a little bit of slough on the surface. It looks as though perhaps some skin tore here. The existing leg wound is more superficial and also has a layer of slough on the surface. The old ostomy site is stable with some slough present. She still does not have an appointment with surgery. 11/05/2022: The satellite wound that was new last week is closed. The initial leg wound is smaller and more superficial with minimal slough on the surface. The old ostomy site is stable with some slough present. Apparently the patient has been waiting on her PCP to make a referral to surgery, rather than contacting them herself, even though she has been an established patient. 11/12/2022: The leg  wound continues to be more superficial. There is some eschar that has built up around the edges. The old ostomy site is stable. She has an appointment with general surgery on October 8. Electronic Signature(s) Signed: 11/12/2022 10:22:46 AM By: [name] MD FACS Entered By: [name] on 11/12/2022 10:22:46 -------------------------------------------------------------------------------- Physical Exam Details Patient Name: Date of Service: Erin Rosier NITA [name] 11/12/2022 9:30 A M Medical Record Number: [id number] Patient Account Number: [id number] Date of Birth/Sex: Treating RN: December 09, 1955 (67 y.o. F) Primary Care Provider: [name] Other Clinician: Referring Provider: Treating Provider/Extender: Jasmine Pang in Treatment: 10 Constitutional . . . . no acute distress. Respiratory Normal work of breathing on room air. Notes 11/12/2022: The leg wound continues to be more superficial. There is some eschar that has built up around the edges. The old ostomy site  bed as instructed Secondary Dressing: ABD Pad, 8x10 1 x Per Day/30 Days Discharge Instructions: Apply over primary dressing as directed. Secured With: 42M Medipore Scientist, research (life sciences) Surgical T 2x10 (in/yd) 1 x Per Day/30 Days ape Discharge Instructions: Secure with tape as directed. WOUND #9: - Lower Leg Wound Laterality: Left, Anterior, Distal Cleanser: Vashe 5.8 (oz) 1 x Per Week/30 Days Discharge Instructions: Cleanse the wound with Vashe prior to applying a clean dressing using gauze sponges, not tissue or cotton balls. Topical: Gentamicin 1 x Per Week/30 Days Discharge Instructions: As directed by physician Topical: Mupirocin Ointment 1 x Per Week/30 Days Discharge Instructions: Apply Mupirocin (Bactroban) as instructed Prim Dressing: Maxorb Extra Ag+ Alginate Dressing, 4x4.75 (in/in) 1 x Per Week/30 Days ary Discharge Instructions: Apply to wound bed as instructed Secondary Dressing: ABD Pad, 8x10 1 x Per Week/30 Days Discharge Instructions: Apply over primary dressing as directed. Com pression Wrap: FourPress (4 layer compression wrap) 1 x Per Week/30 Days Discharge Instructions: Apply four layer compression as directed. May also use Urgo K2 compression system as alternative. Com pression Wrap: Unnaboot w/Calamine, 4x10 (in/yd) 1 x Per Week/30 Days Discharge Instructions: Apply Unnaboot as directed. 11/12/2022: The leg wound continues to be more superficial. There is some eschar that has built up around the edges. The old ostomy site is stable. She has an appointment with general surgery on October 8. I used a curette to debride eschar from the leg wound. I did not perform any debridement of the old ostomy  site. We will continue the mixture of topical gentamicin and mupirocin on the leg site. Silver alginate to both wounds. Urgo 4-layer compression equivalent wrap on the leg and an ABD on the abdomen. Follow-up in 1 week. Electronic Signature(s) Signed: 11/12/2022 4:46:14 PM By: Shawn Stall RN, BSN Signed: 11/13/2022 9:58:14 AM By: [name] MD FACS Previous Signature: 11/12/2022 10:37:00 AM Version By: [name] MD FACS Entered By: Shawn Stall on 11/12/2022 16:45:04 [name], [name] ([id number]) [id number] Page 13 of 14 -------------------------------------------------------------------------------- HxROS Details Patient Name: Date of Service: [name], [name] 11/12/2022 9:30 A M Medical Record Number: [id number] Patient Account Number: [id number] Date of Birth/Sex: Treating RN: 1955-04-19 (67 y.o. F) Primary Care Provider: [name] Other Clinician: Referring Provider: Treating Provider/Extender: Jasmine Pang in Treatment: 10 Information Obtained From Patient Constitutional Symptoms (General Health) Medical History: Past Medical History Notes: morbid obesity Eyes Medical History: Positive for: Cataracts Hematologic/Lymphatic Medical History: Positive for: Lymphedema Cardiovascular Medical History: Positive for: Hypertension; Peripheral Venous Disease Past Medical History Notes: schamberg disease, hyperlipidemia Gastrointestinal Medical History: Past Medical History Notes: diverticulitis , h/o obstruction due to diverticulitis , colostomy and colostomy reversal Endocrine Medical History: Past Medical History Notes: hypothyroidism Integumentary (Skin) Medical History: Negative for: History of Burn Musculoskeletal Medical History: Positive for: Osteoarthritis HBO Extended History Items Eyes: Cataracts Immunizations Pneumococcal Vaccine: Received Pneumococcal Vaccination: Yes Received  Pneumococcal Vaccination On or After 60th Birthday: Yes Implantable Devices None Hospitalization / Surgery History LIBI, CORSO ([id number]) [id number] Page 14 of 14 Type of Hospitalization/Surgery colostomy reversal 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 Family and Social History Cancer: No; Diabetes: No; Heart Disease: Yes - Mother,Father; Hereditary Spherocytosis: No; Hypertension: Yes - Mother,Father; Kidney Disease: Yes - Mother; Lung Disease: No; Seizures: No; Stroke: No; Thyroid Problems: Yes - Mother; Tuberculosis: No; Never smoker; Marital Status - Single; Alcohol Use: Never; Drug Use: No History; Caffeine Use: Daily - coffee; Financial Concerns: No; Food, Clothing or Shelter Needs: No; Support System Lacking: No;  bed as instructed Secondary Dressing: ABD Pad, 8x10 1 x Per Day/30 Days Discharge Instructions: Apply over primary dressing as directed. Secured With: 42M Medipore Scientist, research (life sciences) Surgical T 2x10 (in/yd) 1 x Per Day/30 Days ape Discharge Instructions: Secure with tape as directed. WOUND #9: - Lower Leg Wound Laterality: Left, Anterior, Distal Cleanser: Vashe 5.8 (oz) 1 x Per Week/30 Days Discharge Instructions: Cleanse the wound with Vashe prior to applying a clean dressing using gauze sponges, not tissue or cotton balls. Topical: Gentamicin 1 x Per Week/30 Days Discharge Instructions: As directed by physician Topical: Mupirocin Ointment 1 x Per Week/30 Days Discharge Instructions: Apply Mupirocin (Bactroban) as instructed Prim Dressing: Maxorb Extra Ag+ Alginate Dressing, 4x4.75 (in/in) 1 x Per Week/30 Days ary Discharge Instructions: Apply to wound bed as instructed Secondary Dressing: ABD Pad, 8x10 1 x Per Week/30 Days Discharge Instructions: Apply over primary dressing as directed. Com pression Wrap: FourPress (4 layer compression wrap) 1 x Per Week/30 Days Discharge Instructions: Apply four layer compression as directed. May also use Urgo K2 compression system as alternative. Com pression Wrap: Unnaboot w/Calamine, 4x10 (in/yd) 1 x Per Week/30 Days Discharge Instructions: Apply Unnaboot as directed. 11/12/2022: The leg wound continues to be more superficial. There is some eschar that has built up around the edges. The old ostomy site is stable. She has an appointment with general surgery on October 8. I used a curette to debride eschar from the leg wound. I did not perform any debridement of the old ostomy  site. We will continue the mixture of topical gentamicin and mupirocin on the leg site. Silver alginate to both wounds. Urgo 4-layer compression equivalent wrap on the leg and an ABD on the abdomen. Follow-up in 1 week. Electronic Signature(s) Signed: 11/12/2022 4:46:14 PM By: Shawn Stall RN, BSN Signed: 11/13/2022 9:58:14 AM By: [name] MD FACS Previous Signature: 11/12/2022 10:37:00 AM Version By: [name] MD FACS Entered By: Shawn Stall on 11/12/2022 16:45:04 [name], [name] ([id number]) [id number] Page 13 of 14 -------------------------------------------------------------------------------- HxROS Details Patient Name: Date of Service: [name], [name] 11/12/2022 9:30 A M Medical Record Number: [id number] Patient Account Number: [id number] Date of Birth/Sex: Treating RN: 1955-04-19 (67 y.o. F) Primary Care Provider: [name] Other Clinician: Referring Provider: Treating Provider/Extender: Jasmine Pang in Treatment: 10 Information Obtained From Patient Constitutional Symptoms (General Health) Medical History: Past Medical History Notes: morbid obesity Eyes Medical History: Positive for: Cataracts Hematologic/Lymphatic Medical History: Positive for: Lymphedema Cardiovascular Medical History: Positive for: Hypertension; Peripheral Venous Disease Past Medical History Notes: schamberg disease, hyperlipidemia Gastrointestinal Medical History: Past Medical History Notes: diverticulitis , h/o obstruction due to diverticulitis , colostomy and colostomy reversal Endocrine Medical History: Past Medical History Notes: hypothyroidism Integumentary (Skin) Medical History: Negative for: History of Burn Musculoskeletal Medical History: Positive for: Osteoarthritis HBO Extended History Items Eyes: Cataracts Immunizations Pneumococcal Vaccine: Received Pneumococcal Vaccination: Yes Received  Pneumococcal Vaccination On or After 60th Birthday: Yes Implantable Devices None Hospitalization / Surgery History LIBI, CORSO ([id number]) [id number] Page 14 of 14 Type of Hospitalization/Surgery colostomy reversal 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 Family and Social History Cancer: No; Diabetes: No; Heart Disease: Yes - Mother,Father; Hereditary Spherocytosis: No; Hypertension: Yes - Mother,Father; Kidney Disease: Yes - Mother; Lung Disease: No; Seizures: No; Stroke: No; Thyroid Problems: Yes - Mother; Tuberculosis: No; Never smoker; Marital Status - Single; Alcohol Use: Never; Drug Use: No History; Caffeine Use: Daily - coffee; Financial Concerns: No; Food, Clothing or Shelter Needs: No; Support System Lacking: No;  small distal cluster of wounds has closed. She has a new wound proximal to the main wound. It is superficial and limited to breakdown of skin and appears secondary to friction. The main wound has a fibrotic surface with slough accumulation. Edema control is acceptable. 09/17/2022: The proximal wound has eschar on the surface. Underneath, there is a little bit of slough. The fibrotic surface of the main wound has not really improved, although there is some granulation tissue in the center. Edema control is good. 10/01/2022: The proximal wound is almost completely closed with just a glisten under illumination suggesting that it is not completely healed. The more distal wound is smaller by about half a centimeter. There  is slough accumulation on the surface and it is a little bit less fibrotic today. 10/08/2022: The proximal wound is healed. The more distal wound is smaller. There is some slough accumulation and a little bit of eschar around the edges. Today, the patient pointed out a wound on her left lower abdomen that has been present since a colostomy reversal in 2018. She has not ever discussed this with the surgeon that performed her ostomy takedown and this is the first time she has mentioned it to me. The tissue was very friable and apparently her PCP just started her on Augmentin, although I do not see any obvious signs of infection. 10/16/2022: The leg wound is smaller again today and more superficial. It is much cleaner with very little slough on the surface. The old ostomy site still has hypertrophic granulation tissue present. She is awaiting approval from insurance to see Dr. Maisie Fus, the surgeon that performed her ostomy reversal. 10/22/2022: The leg wound continues to contract. There is minimal slough on the surface. Edema control is good. The old ostomy site looks about the same, but the hypertrophic granulation tissue is less prominent today. 10/29/2022: She has a new leg wound just proximal and lateral to the existing site. There is a little bit of slough on the surface. It looks as though perhaps some skin tore here. The existing leg wound is more superficial and also has a layer of slough on the surface. The old ostomy site is stable with some slough present. She still does not have an appointment with surgery. 11/05/2022: The satellite wound that was new last week is closed. The initial leg wound is smaller and more superficial with minimal slough on the surface. The old ostomy site is stable with some slough present. Apparently the patient has been waiting on her PCP to make a referral to surgery, rather than contacting them herself, even though she has been an established patient. 11/12/2022: The leg  wound continues to be more superficial. There is some eschar that has built up around the edges. The old ostomy site is stable. She has an appointment with general surgery on October 8. Electronic Signature(s) Signed: 11/12/2022 10:22:46 AM By: [name] MD FACS Entered By: [name] on 11/12/2022 10:22:46 -------------------------------------------------------------------------------- Physical Exam Details Patient Name: Date of Service: Erin Rosier NITA [name] 11/12/2022 9:30 A M Medical Record Number: [id number] Patient Account Number: [id number] Date of Birth/Sex: Treating RN: December 09, 1955 (67 y.o. F) Primary Care Provider: [name] Other Clinician: Referring Provider: Treating Provider/Extender: Jasmine Pang in Treatment: 10 Constitutional . . . . no acute distress. Respiratory Normal work of breathing on room air. Notes 11/12/2022: The leg wound continues to be more superficial. There is some eschar that has built up around the edges. The old ostomy site  Transportation Concerns: No Electronic Signature(s) Signed: 11/12/2022 10:48:03 AM By: [name] MD FACS Entered By: [name] on 11/12/2022 10:22:58 -------------------------------------------------------------------------------- SuperBill Details Patient Name: Date of Service: Erin Rosier NITA [name] 11/12/2022 Medical Record Number: [id number] Patient Account Number: [id number] Date of Birth/Sex: Treating RN: 1955-04-01 (67 y.o. 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 with other specif Modifier: ied  severity Quantity: 1 Physician Procedures : CPT4 Code Description Modifier [id number] 99214 - WC PHYS LEVEL 4 - EST PT 25 ICD-10 Diagnosis Description L97.928 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 Electronic Signature(s) Signed: 11/12/2022 10:23:29 AM By: [name] MD FACS Entered By: [name] on 11/12/2022 10:23:29 [name], [name] ([id number]) [id number] Page 6 of 14 -------------------------------------------------------------------------------- Physician Orders Details Patient Name: Date of Service: KAMEE, BOBST 11/12/2022 9:30 A M Medical Record Number: [id number] Patient Account Number: [id number] Date of Birth/Sex: Treating RN: 02/09/1956 (66 y.o. 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 - Dr. Lady Gary 11/19/22 at 9:30am Return A Room 3 ppointment in 2 weeks. - Please ask front desk for appointments Return A Discharge From [facility] - Please keep wearing compression stockings. Anesthetic (In clinic) Topical Lidocaine 4% applied to wound bed Bathing/ Shower/ Hygiene May shower with protection but do not get wound dressing(s) wet. Protect dressing(s) with water repellant cover (for example, large plastic bag) or a cast cover and may then take shower. Wound Treatment Wound #10 - Abdomen - Lower Quadrant Wound Laterality: Left Cleanser: Soap and Water 1 x Per Day/30 Days Discharge Instructions: May shower and wash wound with dial antibacterial soap and water prior to dressing change. Cleanser: Vashe 5.8 (oz) 1 x Per Day/30 Days Discharge Instructions: Cleanse the wound with Vashe prior to applying a clean dressing using gauze sponges, not tissue or cotton balls. Cleanser: Wound Cleanser 1 x Per Day/30 Days Discharge Instructions: Cleanse the wound with wound cleanser prior to applying a clean dressing using gauze sponges, not tissue or cotton balls. Prim Dressing: Maxorb Extra Ag+ Alginate Dressing, 2x2 (in/in) 1 x Per Day/30 Days ary Discharge Instructions: Apply to wound bed as instructed Secondary Dressing: ABD Pad, 8x10 1 x Per Day/30 Days Discharge Instructions: Apply over primary dressing as directed. Secured With: 72M Medipore Scientist, research (life sciences) Surgical T 2x10 (in/yd) 1 x Per Day/30 Days ape Discharge Instructions: Secure with tape as directed. Wound #9 - Lower Leg Wound Laterality: Left, Anterior, Distal Cleanser: Vashe 5.8 (oz) 1 x Per Week/30 Days Discharge Instructions: Cleanse the wound with Vashe prior to applying a clean dressing  using gauze sponges, not tissue or cotton balls. Topical: Gentamicin 1 x Per Week/30 Days Discharge Instructions: As directed by physician Topical: Mupirocin Ointment 1 x Per Week/30 Days Discharge Instructions: Apply Mupirocin (Bactroban) as instructed Prim Dressing: Maxorb Extra Ag+ Alginate Dressing, 4x4.75 (in/in) 1 x Per Week/30 Days ary Discharge Instructions: Apply to wound bed as instructed Secondary Dressing: ABD Pad, 8x10 1 x Per Week/30 Days Discharge Instructions: Apply over primary dressing as directed. Compression Wrap: FourPress (4 layer compression wrap) 1 x Per Week/30 Days Discharge Instructions: Apply four layer compression as directed. May also use Urgo K2 compression system as alternative. Compression Wrap: Unnaboot w/Calamine, 4x10 (in/yd) 1 x Per Week/30 Days Discharge Instructions: Apply Unnaboot as directed. [name], [name] ([id number]) [id number] Page 7 of 14 Electronic Signature(s) Signed: 11/12/2022 10:48:03 AM By: [name] MD FACS Entered By: [name] on 11/12/2022 10:35:22 -------------------------------------------------------------------------------- Problem List Details Patient Name: Date of Service: Erin Rosier NITA [name] 11/12/2022 9:30 A M Medical Record Number: [id number] Patient Account Number: [id number] Date of Birth/Sex: Treating RN: 07-19-1955 (67 y.o.  [name], [name] ([id number]) [id number] Page 1 of 14 Visit Report for 11/12/2022 Chief Complaint Document Details Patient Name: Date of Service: [name], [name] 11/12/2022 9:30 A M Medical Record Number: [id number] Patient Account Number: [id number] Date of Birth/Sex: Treating RN: 10-Oct-1955 (68 y.o. F) Primary Care Provider: [name] Other Clinician: Referring Provider: Treating Provider/Extender: Jasmine Pang in Treatment: 10 Information Obtained from: Patient Chief Complaint 04/19/2021: The patient is here for ongoing follow-up regarding 2 left lower extremity wounds. 01/22/2022; patient returns to clinic with a wound on the left anterior lower leg secondary to trauma Electronic Signature(s) Signed: 11/12/2022 10:22:00 AM By: [name] MD FACS Entered By: [name] on 11/12/2022 10:22:00 -------------------------------------------------------------------------------- Debridement Details Patient Name: Date of Service: Erin Philips [name] 11/12/2022 9:30 A M Medical Record Number: [id number] Patient Account Number: [id number] Date of Birth/Sex: Treating RN: 1955/02/13 (67 y.o. [name] Primary Care Provider: [name] Other Clinician: Referring Provider: Treating Provider/Extender: Jasmine Pang in Treatment: 10 Debridement Performed for Assessment: Wound #9 Left,Distal,Anterior Lower Leg Performed By: Physician [name], MD The following information was scribed by: Karie Schwalbe The information was scribed for: [name] Debridement Type: Debridement Level of Consciousness (Pre-procedure): Awake and Alert Pre-procedure Verification/Time Out Yes - 10:09 Taken: Start Time: 10:09 Pain Control: Lidocaine 4% Topical Solution Percent of Wound Bed Debrided: 100% T Area Debrided (cm): otal 0.2 Tissue and other material debrided: Non-Viable, Eschar Level:  Non-Viable Tissue Debridement Description: Selective/Open Wound Instrument: Curette Bleeding: Minimum Hemostasis Achieved: Pressure End Time: 10:11 Procedural Pain: 0 Post Procedural Pain: 0 Response to Treatment: Procedure was tolerated well Level of Consciousness (Post- Awake and Alert procedure): Post Debridement Measurements of Total Wound Length: (cm) 0.5 Width: (cm) 0.5 Depth: (cm) 0.1 MYSTIQUE, BJELLAND [name] ([id number]) [id number] Page 2 of 14 Volume: (cm) 0.02 Character of Wound/Ulcer Post Debridement: Improved Post Procedure Diagnosis Same as Pre-procedure Electronic Signature(s) Signed: 11/12/2022 10:48:03 AM By: [name] MD FACS Signed: 11/12/2022 3:53:51 PM By: Karie Schwalbe RN Entered By: Karie Schwalbe on 11/12/2022 10:18:36 -------------------------------------------------------------------------------- HPI Details Patient Name: Date of Service: Erin Philips [name] 11/12/2022 9:30 A M Medical Record Number: [id number] Patient Account Number: [id number] Date of Birth/Sex: Treating RN: [birth year] (67 y.o. 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 She tells [name] that she fell on the gravel driveway in July. She developed injuries on her distal lower leg which have not healed. She saw her primary physician on 11/15/2017 who noted her left shin injuries. Gave her antibiotics. At that point the wounds were almost circumferential however most were less than 1.5 cm. Weeping edema fluid was noted. She was referred here for evaluation. The patient has a history of chronic lower extremity edema. 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 She has had prior history with leg weeping fluid. She does not wear compression stockings. She is not doing anything specific to these wound areas. 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  [name], [name] ([id number]) [id number] Page 1 of 14 Visit Report for 11/12/2022 Chief Complaint Document Details Patient Name: Date of Service: [name], [name] 11/12/2022 9:30 A M Medical Record Number: [id number] Patient Account Number: [id number] Date of Birth/Sex: Treating RN: 10-Oct-1955 (68 y.o. F) Primary Care Provider: [name] Other Clinician: Referring Provider: Treating Provider/Extender: Jasmine Pang in Treatment: 10 Information Obtained from: Patient Chief Complaint 04/19/2021: The patient is here for ongoing follow-up regarding 2 left lower extremity wounds. 01/22/2022; patient returns to clinic with a wound on the left anterior lower leg secondary to trauma Electronic Signature(s) Signed: 11/12/2022 10:22:00 AM By: [name] MD FACS Entered By: [name] on 11/12/2022 10:22:00 -------------------------------------------------------------------------------- Debridement Details Patient Name: Date of Service: Erin Philips [name] 11/12/2022 9:30 A M Medical Record Number: [id number] Patient Account Number: [id number] Date of Birth/Sex: Treating RN: 1955/02/13 (67 y.o. [name] Primary Care Provider: [name] Other Clinician: Referring Provider: Treating Provider/Extender: Jasmine Pang in Treatment: 10 Debridement Performed for Assessment: Wound #9 Left,Distal,Anterior Lower Leg Performed By: Physician [name], MD The following information was scribed by: Karie Schwalbe The information was scribed for: [name] Debridement Type: Debridement Level of Consciousness (Pre-procedure): Awake and Alert Pre-procedure Verification/Time Out Yes - 10:09 Taken: Start Time: 10:09 Pain Control: Lidocaine 4% Topical Solution Percent of Wound Bed Debrided: 100% T Area Debrided (cm): otal 0.2 Tissue and other material debrided: Non-Viable, Eschar Level:  Non-Viable Tissue Debridement Description: Selective/Open Wound Instrument: Curette Bleeding: Minimum Hemostasis Achieved: Pressure End Time: 10:11 Procedural Pain: 0 Post Procedural Pain: 0 Response to Treatment: Procedure was tolerated well Level of Consciousness (Post- Awake and Alert procedure): Post Debridement Measurements of Total Wound Length: (cm) 0.5 Width: (cm) 0.5 Depth: (cm) 0.1 MYSTIQUE, BJELLAND [name] ([id number]) [id number] Page 2 of 14 Volume: (cm) 0.02 Character of Wound/Ulcer Post Debridement: Improved Post Procedure Diagnosis Same as Pre-procedure Electronic Signature(s) Signed: 11/12/2022 10:48:03 AM By: [name] MD FACS Signed: 11/12/2022 3:53:51 PM By: Karie Schwalbe RN Entered By: Karie Schwalbe on 11/12/2022 10:18:36 -------------------------------------------------------------------------------- HPI Details Patient Name: Date of Service: Erin Philips [name] 11/12/2022 9:30 A M Medical Record Number: [id number] Patient Account Number: [id number] Date of Birth/Sex: Treating RN: [birth year] (67 y.o. 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 She tells [name] that she fell on the gravel driveway in July. She developed injuries on her distal lower leg which have not healed. She saw her primary physician on 11/15/2017 who noted her left shin injuries. Gave her antibiotics. At that point the wounds were almost circumferential however most were less than 1.5 cm. Weeping edema fluid was noted. She was referred here for evaluation. The patient has a history of chronic lower extremity edema. 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 She has had prior history with leg weeping fluid. She does not wear compression stockings. She is not doing anything specific to these wound areas. 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 Electronic Signature(s) Signed: 11/12/2022 10:23:29 AM By: [name] MD FACS Entered By: [name] on 11/12/2022 10:23:29 [name], [name] ([id number]) [id number] Page 6 of 14 -------------------------------------------------------------------------------- Physician Orders Details Patient Name: Date of Service: KAMEE, BOBST 11/12/2022 9:30 A M Medical Record Number: [id number] Patient Account Number: [id number] Date of Birth/Sex: Treating RN: 02/09/1956 (66 y.o. 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 - Dr. Lady Gary 11/19/22 at 9:30am Return A Room 3 ppointment in 2 weeks. - Please ask front desk for appointments Return A [facility]charge From [facility] - Please keep wearing compression stockings. Anesthetic (In clinic) Topical Lidocaine 4% applied to wound bed Bathing/ Shower/ Hygiene May shower with protection but do not get wound dressing(s) wet. Protect dressing(s) with water repellant cover (for example, large plastic bag) or a cast cover and may then take shower. Wound Treatment Wound #10 - Abdomen - Lower Quadrant Wound Laterality: Left Cleanser: Soap and Water 1 x Per Day/30 Days Discharge Instructions: May shower and wash wound with dial antibacterial soap and water prior to dressing change. Cleanser: Vashe 5.8 (oz) 1 x Per Day/30 Days Discharge Instructions: Cleanse the wound with Vashe prior to applying a clean dressing using gauze sponges, not tissue or cotton balls. Cleanser: Wound Cleanser 1 x Per Day/30 Days Discharge Instructions: Cleanse the wound with wound cleanser prior to applying a clean dressing using gauze sponges, not tissue or cotton balls. Prim Dressing: Maxorb Extra Ag+ Alginate Dressing, 2x2 (in/in) 1 x Per Day/30 Days ary Discharge Instructions: Apply to wound bed as instructed Secondary Dressing: ABD Pad, 8x10 1 x Per Day/30 Days Discharge Instructions: Apply over primary dressing as directed. Secured With: 72M Medipore Scientist, research (life sciences) Surgical T 2x10 (in/yd) 1 x Per Day/30 Days ape Discharge Instructions: Secure with tape as directed. Wound #9 - Lower Leg Wound Laterality: Left, Anterior, Distal Cleanser: Vashe 5.8 (oz) 1 x Per Week/30 Days Discharge Instructions: Cleanse the wound with Vashe prior to applying a clean dressing  using gauze sponges, not tissue or cotton balls. Topical: Gentamicin 1 x Per Week/30 Days Discharge Instructions: As directed by physician Topical: Mupirocin Ointment 1 x Per Week/30 Days Discharge Instructions: Apply Mupirocin (Bactroban) as instructed Prim Dressing: Maxorb Extra Ag+ Alginate Dressing, 4x4.75 (in/in) 1 x Per Week/30 Days ary Discharge Instructions: Apply to wound bed as instructed Secondary Dressing: ABD Pad, 8x10 1 x Per Week/30 Days Discharge Instructions: Apply over primary dressing as directed. Compression Wrap: FourPress (4 layer compression wrap) 1 x Per Week/30 Days Discharge Instructions: Apply four layer compression as directed. May also use Urgo K2 compression system as alternative. Compression Wrap: Unnaboot w/Calamine, 4x10 (in/yd) 1 x Per Week/30 Days Discharge Instructions: Apply Unnaboot as directed. [name], [name] ([id number]) [id number] Page 7 of 14 Electronic Signature(s) Signed: 11/12/2022 10:48:03 AM By: [name] MD FACS Entered By: [name] on 11/12/2022 10:35:22 -------------------------------------------------------------------------------- Problem List Details Patient Name: Date of Service: Erin Rosier NITA [name] 11/12/2022 9:30 A M Medical Record Number: [id number] Patient Account Number: [id number] Date of Birth/Sex: Treating RN: 07-19-1955 (67 y.o.  [name], [name] ([id number]) [id number] Page 1 of 14 Visit Report for 11/12/2022 Chief Complaint Document Details Patient Name: Date of Service: [name], [name] 11/12/2022 9:30 A M Medical Record Number: [id number] Patient Account Number: [id number] Date of Birth/Sex: Treating RN: 10-Oct-1955 (68 y.o. F) Primary Care Provider: [name] Other Clinician: Referring Provider: Treating Provider/Extender: Jasmine Pang in Treatment: 10 Information Obtained from: Patient Chief Complaint 04/19/2021: The patient is here for ongoing follow-up regarding 2 left lower extremity wounds. 01/22/2022; patient returns to clinic with a wound on the left anterior lower leg secondary to trauma Electronic Signature(s) Signed: 11/12/2022 10:22:00 AM By: [name] MD FACS Entered By: [name] on 11/12/2022 10:22:00 -------------------------------------------------------------------------------- Debridement Details Patient Name: Date of Service: Erin Philips [name] 11/12/2022 9:30 A M Medical Record Number: [id number] Patient Account Number: [id number] Date of Birth/Sex: Treating RN: 1955/02/13 (67 y.o. [name] Primary Care Provider: [name] Other Clinician: Referring Provider: Treating Provider/Extender: Jasmine Pang in Treatment: 10 Debridement Performed for Assessment: Wound #9 Left,Distal,Anterior Lower Leg Performed By: Physician [name], MD The following information was scribed by: Karie Schwalbe The information was scribed for: [name] Debridement Type: Debridement Level of Consciousness (Pre-procedure): Awake and Alert Pre-procedure Verification/Time Out Yes - 10:09 Taken: Start Time: 10:09 Pain Control: Lidocaine 4% Topical Solution Percent of Wound Bed Debrided: 100% T Area Debrided (cm): otal 0.2 Tissue and other material debrided: Non-Viable, Eschar Level:  Non-Viable Tissue Debridement Description: Selective/Open Wound Instrument: Curette Bleeding: Minimum Hemostasis Achieved: Pressure End Time: 10:11 Procedural Pain: 0 Post Procedural Pain: 0 Response to Treatment: Procedure was tolerated well Level of Consciousness (Post- Awake and Alert procedure): Post Debridement Measurements of Total Wound Length: (cm) 0.5 Width: (cm) 0.5 Depth: (cm) 0.1 MYSTIQUE, BJELLAND [name] ([id number]) [id number] Page 2 of 14 Volume: (cm) 0.02 Character of Wound/Ulcer Post Debridement: Improved Post Procedure Diagnosis Same as Pre-procedure Electronic Signature(s) Signed: 11/12/2022 10:48:03 AM By: [name] MD FACS Signed: 11/12/2022 3:53:51 PM By: Karie Schwalbe RN Entered By: Karie Schwalbe on 11/12/2022 10:18:36 -------------------------------------------------------------------------------- HPI Details Patient Name: Date of Service: Erin Philips [name] 11/12/2022 9:30 A M Medical Record Number: [id number] Patient Account Number: [id number] Date of Birth/Sex: Treating RN: [birth year] (67 y.o. 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 She tells [name] that she fell on the gravel driveway in July. She developed injuries on her distal lower leg which have not healed. She saw her primary physician on 11/15/2017 who noted her left shin injuries. Gave her antibiotics. At that point the wounds were almost circumferential however most were less than 1.5 cm. Weeping edema fluid was noted. She was referred here for evaluation. The patient has a history of chronic lower extremity edema. 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 She has had prior history with leg weeping fluid. She does not wear compression stockings. She is not doing anything specific to these wound areas. 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 11/12/2022 9:30 A M Medical Record Number: [id number] Patient Account Number: [id number] Date of Birth/Sex: Treating RN: 06-01-1955 (67 y.o. F) Primary Care Provider: [name] Other Clinician: Referring Provider: Treating Provider/Extender: Jasmine Pang in Treatment: 10 Subjective Chief Complaint Information obtained from Patient 04/19/2021: The patient is here for ongoing follow-up regarding 2 left lower extremity wounds. 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 She tells [name] that she fell on the gravel driveway in July. She developed injuries on her distal lower leg which have not healed. She saw her primary physician on 11/15/2017 who noted her left shin injuries. Gave her antibiotics. At that point the wounds were almost circumferential however most were less than 1.5 cm. Weeping edema fluid was noted. She was referred here for evaluation. The patient has a history of chronic lower extremity edema. 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 She has had prior history with leg weeping fluid. She does not wear compression stockings. She is not doing anything specific to these wound areas. The patient has a history of obesity, arthritis, peripheral vascular  disease hypertension lower extremity edema and Schamberg's disease ABI in our clinic was 1.3 on the left 12/17/2017; patient readmitted to the clinic last week. 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 She also has lymphedema. Put her in 3 layer compression and silver alginate last week. She has 3 small wounds with depth just lateral to the tibia. More problematically than this she has numerous shallow areas some of which are almost canal like in shape with tightly adherent painful debris. It would be very difficult and time- consuming to go through this and attempt to individually debride all these areas.. I changed her to collagen today to see if that would help with any of the surface debris on some of these wounds. Otherwise we will not be able to put this in compression we have to have someone change the dressing. The patient is not eligible for home health 12/25/17 on evaluation today patient actually appears to be doing rather well in regard to the ulcer on her lower extremity. Fortunately there does not appear to be evidence of infection at this time. She has been tolerating the dressing changes without complication. This includes the compression wrap. The only issue she had was that the wrap was initially placed over her bunion region which actually calls her some discomfort and pain. Other than that things seem to be going rather well. 01/01/2018 Seen  [name], [name] ([id number]) [id number] Page 1 of 14 Visit Report for 11/12/2022 Chief Complaint Document Details Patient Name: Date of Service: [name], [name] 11/12/2022 9:30 A M Medical Record Number: [id number] Patient Account Number: [id number] Date of Birth/Sex: Treating RN: 10-Oct-1955 (68 y.o. F) Primary Care Provider: [name] Other Clinician: Referring Provider: Treating Provider/Extender: Jasmine Pang in Treatment: 10 Information Obtained from: Patient Chief Complaint 04/19/2021: The patient is here for ongoing follow-up regarding 2 left lower extremity wounds. 01/22/2022; patient returns to clinic with a wound on the left anterior lower leg secondary to trauma Electronic Signature(s) Signed: 11/12/2022 10:22:00 AM By: [name] MD FACS Entered By: [name] on 11/12/2022 10:22:00 -------------------------------------------------------------------------------- Debridement Details Patient Name: Date of Service: Erin Philips [name] 11/12/2022 9:30 A M Medical Record Number: [id number] Patient Account Number: [id number] Date of Birth/Sex: Treating RN: 1955/02/13 (67 y.o. [name] Primary Care Provider: [name] Other Clinician: Referring Provider: Treating Provider/Extender: Jasmine Pang in Treatment: 10 Debridement Performed for Assessment: Wound #9 Left,Distal,Anterior Lower Leg Performed By: Physician [name], MD The following information was scribed by: Karie Schwalbe The information was scribed for: [name] Debridement Type: Debridement Level of Consciousness (Pre-procedure): Awake and Alert Pre-procedure Verification/Time Out Yes - 10:09 Taken: Start Time: 10:09 Pain Control: Lidocaine 4% Topical Solution Percent of Wound Bed Debrided: 100% T Area Debrided (cm): otal 0.2 Tissue and other material debrided: Non-Viable, Eschar Level:  Non-Viable Tissue Debridement Description: Selective/Open Wound Instrument: Curette Bleeding: Minimum Hemostasis Achieved: Pressure End Time: 10:11 Procedural Pain: 0 Post Procedural Pain: 0 Response to Treatment: Procedure was tolerated well Level of Consciousness (Post- Awake and Alert procedure): Post Debridement Measurements of Total Wound Length: (cm) 0.5 Width: (cm) 0.5 Depth: (cm) 0.1 MYSTIQUE, BJELLAND [name] ([id number]) [id number] Page 2 of 14 Volume: (cm) 0.02 Character of Wound/Ulcer Post Debridement: Improved Post Procedure Diagnosis Same as Pre-procedure Electronic Signature(s) Signed: 11/12/2022 10:48:03 AM By: [name] MD FACS Signed: 11/12/2022 3:53:51 PM By: Karie Schwalbe RN Entered By: Karie Schwalbe on 11/12/2022 10:18:36 -------------------------------------------------------------------------------- HPI Details Patient Name: Date of Service: Erin Philips [name] 11/12/2022 9:30 A M Medical Record Number: [id number] Patient Account Number: [id number] Date of Birth/Sex: Treating RN: [birth year] (67 y.o. 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 She tells [name] that she fell on the gravel driveway in July. She developed injuries on her distal lower leg which have not healed. She saw her primary physician on 11/15/2017 who noted her left shin injuries. Gave her antibiotics. At that point the wounds were almost circumferential however most were less than 1.5 cm. Weeping edema fluid was noted. She was referred here for evaluation. The patient has a history of chronic lower extremity edema. 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 She has had prior history with leg weeping fluid. She does not wear compression stockings. She is not doing anything specific to these wound areas. 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 11/12/2022 9:30 A M Medical Record Number: [id number] Patient Account Number: [id number] Date of Birth/Sex: Treating RN: 06-01-1955 (67 y.o. F) Primary Care Provider: [name] Other Clinician: Referring Provider: Treating Provider/Extender: Jasmine Pang in Treatment: 10 Subjective Chief Complaint Information obtained from Patient 04/19/2021: The patient is here for ongoing follow-up regarding 2 left lower extremity wounds. 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 She tells [name] that she fell on the gravel driveway in July. She developed injuries on her distal lower leg which have not healed. She saw her primary physician on 11/15/2017 who noted her left shin injuries. Gave her antibiotics. At that point the wounds were almost circumferential however most were less than 1.5 cm. Weeping edema fluid was noted. She was referred here for evaluation. The patient has a history of chronic lower extremity edema. 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 She has had prior history with leg weeping fluid. She does not wear compression stockings. She is not doing anything specific to these wound areas. The patient has a history of obesity, arthritis, peripheral vascular  disease hypertension lower extremity edema and Schamberg's disease ABI in our clinic was 1.3 on the left 12/17/2017; patient readmitted to the clinic last week. 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 She also has lymphedema. Put her in 3 layer compression and silver alginate last week. She has 3 small wounds with depth just lateral to the tibia. More problematically than this she has numerous shallow areas some of which are almost canal like in shape with tightly adherent painful debris. It would be very difficult and time- consuming to go through this and attempt to individually debride all these areas.. I changed her to collagen today to see if that would help with any of the surface debris on some of these wounds. Otherwise we will not be able to put this in compression we have to have someone change the dressing. The patient is not eligible for home health 12/25/17 on evaluation today patient actually appears to be doing rather well in regard to the ulcer on her lower extremity. Fortunately there does not appear to be evidence of infection at this time. She has been tolerating the dressing changes without complication. This includes the compression wrap. The only issue she had was that the wrap was initially placed over her bunion region which actually calls her some discomfort and pain. Other than that things seem to be going rather well. 01/01/2018 Seen  is stable. Electronic Signature(s) Signed: 11/12/2022 10:23:29 AM By: [name] MD FACS Entered By: [name] on 11/12/2022 10:23:29 [name], [name] ([id number]) [id number] Page 6 of 14 -------------------------------------------------------------------------------- Physician Orders Details Patient Name: Date of Service: KAMEE, BOBST 11/12/2022 9:30 A M Medical Record Number: [id number] Patient Account Number: [id number] Date of Birth/Sex: Treating RN: 02/09/1956 (66 y.o. 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 - Dr. Lady Gary 11/19/22 at 9:30am Return A Room 3 ppointment in 2 weeks. - Please ask front desk for appointments Return A [facility]charge From [facility] - Please keep wearing compression stockings. Anesthetic (In clinic) Topical Lidocaine 4% applied to wound bed Bathing/ Shower/ Hygiene May shower with protection but do not get wound dressing(s) wet. Protect dressing(s) with water repellant cover (for example, large plastic bag) or a cast cover and may then take shower. Wound Treatment Wound #10 - Abdomen - Lower Quadrant Wound Laterality: Left Cleanser: Soap and Water 1 x Per Day/30 Days Discharge Instructions: May shower and wash wound with dial antibacterial soap and water prior to dressing change. Cleanser: Vashe 5.8 (oz) 1 x Per Day/30 Days Discharge Instructions: Cleanse the wound with Vashe prior to applying a clean dressing using gauze sponges, not tissue or cotton balls. Cleanser: Wound Cleanser 1 x Per Day/30 Days Discharge Instructions: Cleanse the wound with wound cleanser prior to applying a clean dressing using gauze sponges, not tissue or cotton balls. Prim Dressing: Maxorb Extra Ag+ Alginate Dressing, 2x2 (in/in) 1 x Per Day/30 Days ary Discharge Instructions: Apply to wound bed as instructed Secondary Dressing: ABD Pad, 8x10 1 x Per Day/30 Days Discharge Instructions: Apply over primary dressing as directed. Secured With: 72M Medipore Scientist, research (life sciences) Surgical T 2x10 (in/yd) 1 x Per Day/30 Days ape Discharge Instructions: Secure with tape as directed. Wound #9 - Lower Leg Wound Laterality: Left, Anterior, Distal Cleanser: Vashe 5.8 (oz) 1 x Per Week/30 Days Discharge Instructions: Cleanse the wound with Vashe prior to applying a clean dressing  using gauze sponges, not tissue or cotton balls. Topical: Gentamicin 1 x Per Week/30 Days Discharge Instructions: As directed by physician Topical: Mupirocin Ointment 1 x Per Week/30 Days Discharge Instructions: Apply Mupirocin (Bactroban) as instructed Prim Dressing: Maxorb Extra Ag+ Alginate Dressing, 4x4.75 (in/in) 1 x Per Week/30 Days ary Discharge Instructions: Apply to wound bed as instructed Secondary Dressing: ABD Pad, 8x10 1 x Per Week/30 Days Discharge Instructions: Apply over primary dressing as directed. Compression Wrap: FourPress (4 layer compression wrap) 1 x Per Week/30 Days Discharge Instructions: Apply four layer compression as directed. May also use Urgo K2 compression system as alternative. Compression Wrap: Unnaboot w/Calamine, 4x10 (in/yd) 1 x Per Week/30 Days Discharge Instructions: Apply Unnaboot as directed. [name], [name] ([id number]) [id number] Page 7 of 14 Electronic Signature(s) Signed: 11/12/2022 10:48:03 AM By: [name] MD FACS Entered By: [name] on 11/12/2022 10:35:22 -------------------------------------------------------------------------------- Problem List Details Patient Name: Date of Service: Erin Rosier NITA [name] 11/12/2022 9:30 A M Medical Record Number: [id number] Patient Account Number: [id number] Date of Birth/Sex: Treating RN: 07-19-1955 (67 y.o.  [name], [name] ([id number]) [id number] Page 1 of 14 Visit Report for 11/12/2022 Chief Complaint Document Details Patient Name: Date of Service: [name], [name] 11/12/2022 9:30 A M Medical Record Number: [id number] Patient Account Number: [id number] Date of Birth/Sex: Treating RN: 10-Oct-1955 (68 y.o. F) Primary Care Provider: [name] Other Clinician: Referring Provider: Treating Provider/Extender: Jasmine Pang in Treatment: 10 Information Obtained from: Patient Chief Complaint 04/19/2021: The patient is here for ongoing follow-up regarding 2 left lower extremity wounds. 01/22/2022; patient returns to clinic with a wound on the left anterior lower leg secondary to trauma Electronic Signature(s) Signed: 11/12/2022 10:22:00 AM By: [name] MD FACS Entered By: [name] on 11/12/2022 10:22:00 -------------------------------------------------------------------------------- Debridement Details Patient Name: Date of Service: Erin Philips [name] 11/12/2022 9:30 A M Medical Record Number: [id number] Patient Account Number: [id number] Date of Birth/Sex: Treating RN: 1955/02/13 (67 y.o. [name] Primary Care Provider: [name] Other Clinician: Referring Provider: Treating Provider/Extender: Jasmine Pang in Treatment: 10 Debridement Performed for Assessment: Wound #9 Left,Distal,Anterior Lower Leg Performed By: Physician [name], MD The following information was scribed by: Karie Schwalbe The information was scribed for: [name] Debridement Type: Debridement Level of Consciousness (Pre-procedure): Awake and Alert Pre-procedure Verification/Time Out Yes - 10:09 Taken: Start Time: 10:09 Pain Control: Lidocaine 4% Topical Solution Percent of Wound Bed Debrided: 100% T Area Debrided (cm): otal 0.2 Tissue and other material debrided: Non-Viable, Eschar Level:  Non-Viable Tissue Debridement Description: Selective/Open Wound Instrument: Curette Bleeding: Minimum Hemostasis Achieved: Pressure End Time: 10:11 Procedural Pain: 0 Post Procedural Pain: 0 Response to Treatment: Procedure was tolerated well Level of Consciousness (Post- Awake and Alert procedure): Post Debridement Measurements of Total Wound Length: (cm) 0.5 Width: (cm) 0.5 Depth: (cm) 0.1 MYSTIQUE, BJELLAND [name] ([id number]) [id number] Page 2 of 14 Volume: (cm) 0.02 Character of Wound/Ulcer Post Debridement: Improved Post Procedure Diagnosis Same as Pre-procedure Electronic Signature(s) Signed: 11/12/2022 10:48:03 AM By: [name] MD FACS Signed: 11/12/2022 3:53:51 PM By: Karie Schwalbe RN Entered By: Karie Schwalbe on 11/12/2022 10:18:36 -------------------------------------------------------------------------------- HPI Details Patient Name: Date of Service: Erin Philips [name] 11/12/2022 9:30 A M Medical Record Number: [id number] Patient Account Number: [id number] Date of Birth/Sex: Treating RN: [birth year] (67 y.o. 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 She tells [name] that she fell on the gravel driveway in July. She developed injuries on her distal lower leg which have not healed. She saw her primary physician on 11/15/2017 who noted her left shin injuries. Gave her antibiotics. At that point the wounds were almost circumferential however most were less than 1.5 cm. Weeping edema fluid was noted. She was referred here for evaluation. The patient has a history of chronic lower extremity edema. 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 She has had prior history with leg weeping fluid. She does not wear compression stockings. She is not doing anything specific to these wound areas. 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  Transportation Concerns: No Electronic Signature(s) Signed: 11/12/2022 10:48:03 AM By: [name] MD FACS Entered By: [name] on 11/12/2022 10:22:58 -------------------------------------------------------------------------------- SuperBill Details Patient Name: Date of Service: Erin Rosier NITA [name] 11/12/2022 Medical Record Number: [id number] Patient Account Number: [id number] Date of Birth/Sex: Treating RN: 1955-04-01 (67 y.o. 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 with other specif Modifier: ied  severity Quantity: 1 Physician Procedures : CPT4 Code Description Modifier [id number] 99214 - WC PHYS LEVEL 4 - EST PT 25 ICD-10 Diagnosis Description L97.928 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 11/12/2022 Medical Record Number: [id number] Patient Account Number: [id number] Date of Birth/Sex: Treating RN: 1955-04-01 (67 y.o. 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 with other specif Modifier: ied  severity Quantity: 1 Physician Procedures : CPT4 Code Description Modifier [id number] 99214 - WC PHYS LEVEL 4 - EST PT 25 ICD-10 Diagnosis Description L97.928 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 11/12/2022 Medical Record Number: [id number] Patient Account Number: [id number] Date of Birth/Sex: Treating RN: 1955-04-01 (67 y.o. 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 with other specif Modifier: ied  severity Quantity: 1 Physician Procedures : CPT4 Code Description Modifier [id number] 99214 - WC PHYS LEVEL 4 - EST PT 25 ICD-10 Diagnosis Description L97.928 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

## 2022-11-12 NOTE — Progress Notes (Signed)
[name], [name] ([id number]) [id number] Page 1 of 9 Visit Report for 11/12/2022 Arrival Information Details Patient Name: Date of Service: [name], [name] 11/12/2022 9:30 A M Medical Record Number: [id number] Patient Account Number: [id number] Date of Birth/Sex: Treating RN: 20-Jan-1956 (67 y.o. F) Primary Care Benedict Kue: [name] Other Clinician: Referring Wendelin Bradt: Treating Addilynne Olheiser/Extender: Jasmine Pang in Treatment: 10 Visit Information History Since Last Visit Added or deleted any medications: No Patient Arrived: Walker Any new allergies or adverse reactions: No Arrival Time: 09:47 Had a fall or experienced change in No Accompanied By: self activities of daily living that may affect Transfer Assistance: None risk of falls: Patient Identification Verified: Yes Signs or symptoms of abuse/neglect since last visito No Secondary Verification Process Completed: Yes Hospitalized since last visit: No Patient Requires Transmission-Based Precautions: No Implantable device outside of the clinic excluding No Patient Has Alerts: No cellular tissue based products placed in the center since last visit: Pain Present Now: No Electronic Signature(s) Signed: 11/12/2022 11:37:32 AM By: Dayton Scrape Entered By: Dayton Scrape on 11/12/2022 09:48:19 -------------------------------------------------------------------------------- Compression Therapy Details Patient Name: Date of Service: [name], [name] 11/12/2022 9:30 A M Medical Record Number: [id number] Patient Account Number: [id number] Date of Birth/Sex: Treating RN: 1955-11-23 (67 y.o. [name] Primary Care Nefertari Rebman: [name] Other Clinician: Referring Keilyn Nadal: Treating Brenyn Petrey/Extender: Jasmine Pang in Treatment: 10 Compression Therapy Performed for Wound Assessment: Wound #9 Left,Distal,Anterior Lower Leg Performed By: Clinician  Karie Schwalbe, RN Compression Type: Four Layer Post Procedure Diagnosis Same as Pre-procedure Electronic Signature(s) Signed: 11/12/2022 3:53:51 PM By: Karie Schwalbe RN Entered By: Karie Schwalbe on 11/12/2022 10:47:00 Encounter Discharge Information Details -------------------------------------------------------------------------------- Martie Round ([id number]) [id number] Page 2 of 9 Patient Name: Date of Service: [name], [name] 11/12/2022 9:30 A M Medical Record Number: [id number] Patient Account Number: [id number] Date of Birth/Sex: Treating RN: 09-Feb-1956 (67 y.o. [name] Primary Care Ladarion Munyon: [name] Other Clinician: Referring Andriana Casa: Treating Dwana Garin/Extender: Jasmine Pang in Treatment: 10 Encounter Discharge Information Items Post Procedure Vitals Discharge Condition: Stable Temperature (F): 98.1 Ambulatory Status: Walker Pulse (bpm): 91 Discharge Destination: Home Respiratory Rate (breaths/min): 18 Transportation: Private Auto Blood Pressure (mmHg): 128/73 Accompanied By: self Schedule Follow-up Appointment: Yes Clinical Summary of Care: Patient Declined Electronic Signature(s) Signed: 11/12/2022 3:53:51 PM By: Karie Schwalbe RN Entered By: Karie Schwalbe on 11/12/2022 15:53:27 -------------------------------------------------------------------------------- Lower Extremity Assessment Details Patient Name: Date of Service: [name], [name] 11/12/2022 9:30 A M Medical Record Number: [id number] Patient Account Number: [id number] Date of Birth/Sex: Treating RN: [birth year] (67 y.o. [name] Primary Care Tanisa Lagace: [name] Other Clinician: Referring Patriece Archbold: Treating Almon Whitford/Extender: Jasmine Pang in Treatment: 10 Edema Assessment Assessed: [Left: No] [Right: No] [Left: Edema] [Right: :] Calf Left: Right: Point of Measurement: 34 cm  From Medial Instep 37 cm 45.5 cm Ankle Left: Right: Point of Measurement: 9 cm From Medial Instep 22.8 cm 23 cm Knee To Floor Left: Right: From Medial Instep 41 cm Vascular Assessment Pulses: Dorsalis Pedis Palpable: [Left:Yes] Extremity colors, hair growth, and conditions: Hair Growth on Extremity: [Left:No] [Right:No] Temperature of Extremity: [Left:Warm] [Right:Warm] Capillary Refill: [Left:< 3 seconds] [Right:< 3 seconds] Dependent Rubor: [Left:No No] [Right:No No] Toe Nail Assessment Left: Right: Thick: Yes Discolored: No Deformed: No Improper Length and HygieneLOZA, [name] ([id number]) [id number] Page 3 of 9 Electronic Signature(s) Signed: 11/12/2022 3:53:51 PM By: Karie Schwalbe RN Entered By: Karie Schwalbe on 11/12/2022  Page 5 of 9 -------------------------------------------------------------------------------- Pain Assessment Details Patient Name: Date of Service: [name], [name] 11/12/2022 9:30 A M Medical Record Number: [id number] Patient Account Number: [id number] Date of Birth/Sex: Treating RN: [birth year] (67 y.o. F) Primary Care Rutledge Selsor: [name] Other  Clinician: Referring Avalynn Bowe: Treating Brady Plant/Extender: Jasmine Pang in Treatment: 10 Active Problems Location of Pain Severity and Description of Pain Patient Has Paino No Site Locations Pain Management and Medication Current Pain Management: Electronic Signature(s) Signed: 11/12/2022 11:37:32 AM By: Dayton Scrape Entered By: Dayton Scrape on 11/12/2022 09:48:55 -------------------------------------------------------------------------------- Patient/Caregiver Education Details Patient Name: Date of Service: [name] 9/30/2024andnbsp9:30 A M Medical Record Number: [id number] Patient Account Number: [id number] Date of Birth/Gender: Treating RN: 06/08/1955 (67 y.o. [name] Primary Care Physician: [name] Other Clinician: Referring Physician: Treating Physician/Extender: Jasmine Pang in Treatment: 10 Education Assessment Education Provided To: Patient Education Topics Provided Wound/Skin Impairment: Methods: Explain/Verbal Responses: State content correctly [name], [name] ([id number]) [id number] Page 6 of 9 Electronic Signature(s) Signed: 11/12/2022 3:53:51 PM By: Karie Schwalbe RN Entered By: Karie Schwalbe on 11/12/2022 15:52:25 -------------------------------------------------------------------------------- Wound Assessment Details Patient Name: Date of Service: [name], [name] 11/12/2022 9:30 A M Medical Record Number: [id number] Patient Account Number: [id number] Date of Birth/Sex: Treating RN: [birth year] (67 y.o. [name] Primary Care Mashayla Lavin: [name] Other Clinician: Referring Monty Spicher: Treating Peggi Yono/Extender: Jasmine Pang in Treatment: 10 Wound Status Wound Number: 10 Primary Abscess Etiology: Wound Location: Left Abdomen - Lower Quadrant Wound Open Wounding Event: Surgical Injury Status: Date Acquired:  02/06/2017 Comorbid Cataracts, Lymphedema, Hypertension, Peripheral Venous Weeks Of Treatment: 5 History: Disease, Osteoarthritis Clustered Wound: No Photos Wound Measurements Length: (cm) 1.2 Width: (cm) 0.9 Depth: (cm) 0.4 Area: (cm) 0.848 Volume: (cm) 0.339 % Reduction in Area: 66.8% % Reduction in Volume: -32.9% Epithelialization: Small (1-33%) Tunneling: No Undermining: No Wound Description Classification: Full Thickness Without Exposed Suppor Wound Margin: Distinct, outline attached Exudate Amount: Medium Exudate Type: Serosanguineous Exudate Color: red, brown t Structures Foul Odor After Cleansing: No Slough/Fibrino Yes Wound Bed Granulation Amount: Large (67-100%) Exposed Structure Granulation Quality: Red, Hyper-granulation, Friable Fascia Exposed: No Necrotic Amount: Small (1-33%) Fat Layer (Subcutaneous Tissue) Exposed: Yes Necrotic Quality: Adherent Slough Tendon Exposed: No Muscle Exposed: No Joint Exposed: No Bone Exposed: No Periwound Skin Texture Texture Color No Abnormalities Noted: No No Abnormalities Noted: Yes Scarring: Yes Temperature / Pain Temperature: No Abnormality Moisture No Abnormalities NotedZEYNAB, KLETT ([id number]) [id number] Page 7 of 9 Treatment Notes Wound #10 (Abdomen - Lower Quadrant) Wound Laterality: Left Cleanser Soap and Water Discharge Instruction: May shower and wash wound with dial antibacterial soap and water prior to dressing change. Vashe 5.8 (oz) Discharge Instruction: Cleanse the wound with Vashe prior to applying a clean dressing using gauze sponges, not tissue or cotton balls. Wound Cleanser Discharge Instruction: Cleanse the wound with wound cleanser prior to applying a clean dressing using gauze sponges, not tissue or cotton balls. Peri-Wound Care Topical Primary Dressing Maxorb Extra Ag+ Alginate Dressing, 2x2 (in/in) Discharge Instruction: Apply to wound bed as  instructed Secondary Dressing ABD Pad, 8x10 Discharge Instruction: Apply over primary dressing as directed. Secured With [employer] T 2x10 (in/yd) ape Discharge Instruction: Secure with tape as directed. Compression Wrap Compression Stockings Add-Ons Electronic Signature(s) Signed: 11/12/2022 3:53:51 PM By: Karie Schwalbe RN Entered By: Karie Schwalbe on 11/12/2022 10:10:29 -------------------------------------------------------------------------------- Wound Assessment Details Patient Name: Date of Service: [name]  [name], [name] ([id number]) [id number] Page 1 of 9 Visit Report for 11/12/2022 Arrival Information Details Patient Name: Date of Service: [name], [name] 11/12/2022 9:30 A M Medical Record Number: [id number] Patient Account Number: [id number] Date of Birth/Sex: Treating RN: 20-Jan-1956 (67 y.o. F) Primary Care Benedict Kue: [name] Other Clinician: Referring Wendelin Bradt: Treating Addilynne Olheiser/Extender: Jasmine Pang in Treatment: 10 Visit Information History Since Last Visit Added or deleted any medications: No Patient Arrived: Walker Any new allergies or adverse reactions: No Arrival Time: 09:47 Had a fall or experienced change in No Accompanied By: self activities of daily living that may affect Transfer Assistance: None risk of falls: Patient Identification Verified: Yes Signs or symptoms of abuse/neglect since last visito No Secondary Verification Process Completed: Yes Hospitalized since last visit: No Patient Requires Transmission-Based Precautions: No Implantable device outside of the clinic excluding No Patient Has Alerts: No cellular tissue based products placed in the center since last visit: Pain Present Now: No Electronic Signature(s) Signed: 11/12/2022 11:37:32 AM By: Dayton Scrape Entered By: Dayton Scrape on 11/12/2022 09:48:19 -------------------------------------------------------------------------------- Compression Therapy Details Patient Name: Date of Service: [name], [name] 11/12/2022 9:30 A M Medical Record Number: [id number] Patient Account Number: [id number] Date of Birth/Sex: Treating RN: 1955-11-23 (67 y.o. [name] Primary Care Nefertari Rebman: [name] Other Clinician: Referring Keilyn Nadal: Treating Brenyn Petrey/Extender: Jasmine Pang in Treatment: 10 Compression Therapy Performed for Wound Assessment: Wound #9 Left,Distal,Anterior Lower Leg Performed By: Clinician  Karie Schwalbe, RN Compression Type: Four Layer Post Procedure Diagnosis Same as Pre-procedure Electronic Signature(s) Signed: 11/12/2022 3:53:51 PM By: Karie Schwalbe RN Entered By: Karie Schwalbe on 11/12/2022 10:47:00 Encounter Discharge Information Details -------------------------------------------------------------------------------- Martie Round ([id number]) [id number] Page 2 of 9 Patient Name: Date of Service: [name], [name] 11/12/2022 9:30 A M Medical Record Number: [id number] Patient Account Number: [id number] Date of Birth/Sex: Treating RN: 09-Feb-1956 (67 y.o. [name] Primary Care Ladarion Munyon: [name] Other Clinician: Referring Andriana Casa: Treating Dwana Garin/Extender: Jasmine Pang in Treatment: 10 Encounter Discharge Information Items Post Procedure Vitals Discharge Condition: Stable Temperature (F): 98.1 Ambulatory Status: Walker Pulse (bpm): 91 Discharge Destination: Home Respiratory Rate (breaths/min): 18 Transportation: Private Auto Blood Pressure (mmHg): 128/73 Accompanied By: self Schedule Follow-up Appointment: Yes Clinical Summary of Care: Patient Declined Electronic Signature(s) Signed: 11/12/2022 3:53:51 PM By: Karie Schwalbe RN Entered By: Karie Schwalbe on 11/12/2022 15:53:27 -------------------------------------------------------------------------------- Lower Extremity Assessment Details Patient Name: Date of Service: [name], [name] 11/12/2022 9:30 A M Medical Record Number: [id number] Patient Account Number: [id number] Date of Birth/Sex: Treating RN: [birth year] (67 y.o. [name] Primary Care Tanisa Lagace: [name] Other Clinician: Referring Patriece Archbold: Treating Almon Whitford/Extender: Jasmine Pang in Treatment: 10 Edema Assessment Assessed: [Left: No] [Right: No] [Left: Edema] [Right: :] Calf Left: Right: Point of Measurement: 34 cm  From Medial Instep 37 cm 45.5 cm Ankle Left: Right: Point of Measurement: 9 cm From Medial Instep 22.8 cm 23 cm Knee To Floor Left: Right: From Medial Instep 41 cm Vascular Assessment Pulses: Dorsalis Pedis Palpable: [Left:Yes] Extremity colors, hair growth, and conditions: Hair Growth on Extremity: [Left:No] [Right:No] Temperature of Extremity: [Left:Warm] [Right:Warm] Capillary Refill: [Left:< 3 seconds] [Right:< 3 seconds] Dependent Rubor: [Left:No No] [Right:No No] Toe Nail Assessment Left: Right: Thick: Yes Discolored: No Deformed: No Improper Length and HygieneLOZA, [name] ([id number]) [id number] Page 3 of 9 Electronic Signature(s) Signed: 11/12/2022 3:53:51 PM By: Karie Schwalbe RN Entered By: Karie Schwalbe on 11/12/2022  [name] 11/12/2022 9:30 A M Medical Record Number: [id number] Patient Account Number: [id number] Date of Birth/Sex: Treating RN: 1955-07-31 (67 y.o. [name] Primary Care Devoiry Corriher: [name] Other Clinician: Referring Waymon Laser: Treating Cystal Shannahan/Extender: Jasmine Pang in Treatment: 10 Wound Status Wound Number: 9 Primary Vasculitis Etiology: Wound Location: Left, Distal, Anterior Lower Leg Wound Open Wounding Event: Gradually Appeared Status: Date Acquired: 07/14/2022 Comorbid Cataracts, Lymphedema, Hypertension, Peripheral Venous Weeks Of Treatment: 10 History: Disease, Osteoarthritis Clustered Wound: No Photos DORINDA, STEHR ([id number]) [id number] Page 8 of 9 Wound Measurements Length: (cm) 0.5 Width: (cm) 0.5 Depth: (cm) 0.1 Area: (cm) 0.196 Volume: (cm) 0.02 % Reduction in Area: 97.9% % Reduction in Volume: 97.9% Epithelialization: Medium (34-66%) Tunneling: No Wound Description Classification: Full Thickness Without Exposed Support Structures Wound Margin: Distinct, outline attached Exudate Amount: Medium Exudate Type: Serosanguineous Exudate Color: red, brown Foul Odor After Cleansing: No Slough/Fibrino Yes Wound Bed Granulation Amount: Large (67-100%) Exposed Structure Granulation Quality: Pink Fascia Exposed: No Necrotic Amount: Small  (1-33%) Fat Layer (Subcutaneous Tissue) Exposed: Yes Necrotic Quality: Adherent Slough Tendon Exposed: No Muscle Exposed: No Joint Exposed: No Bone Exposed: No Periwound Skin Texture Texture Color No Abnormalities Noted: No No Abnormalities Noted: No Scarring: Yes Hemosiderin Staining: Yes Moisture Temperature / Pain No Abnormalities Noted: Yes Temperature: No Abnormality Treatment Notes Wound #9 (Lower Leg) Wound Laterality: Left, Anterior, Distal Cleanser Vashe 5.8 (oz) Discharge Instruction: Cleanse the wound with Vashe prior to applying a clean dressing using gauze sponges, not tissue or cotton balls. Peri-Wound Care Topical Gentamicin Discharge Instruction: As directed by physician Mupirocin Ointment Discharge Instruction: Apply Mupirocin (Bactroban) as instructed Primary Dressing Maxorb Extra Ag+ Alginate Dressing, 4x4.75 (in/in) Discharge Instruction: Apply to wound bed as instructed Secondary Dressing ABD Pad, 8x10 Discharge Instruction: Apply over primary dressing as directed. Secured With Compression Wrap FourPress (4 layer compression wrap) Discharge Instruction: Apply four layer compression as directed. May also use Urgo K2 compression system as alternative. Unnaboot w/Calamine, 4x10 (in/yd) VELDA, WENDT [name] ([id number]) [id number] Page 9 of 9 Discharge Instruction: Apply Unnaboot as directed. Compression Stockings Add-Ons Electronic Signature(s) Signed: 11/12/2022 3:53:51 PM By: Karie Schwalbe RN Entered By: Karie Schwalbe on 11/12/2022 10:09:27 -------------------------------------------------------------------------------- Vitals Details Patient Name: Date of Service: [name] 11/12/2022 9:30 A M Medical Record Number: [id number] Patient Account Number: [id number] Date of Birth/Sex: Treating RN: 12/22/1955 (67 y.o. F) Primary Care Mylik Pro: [name] Other Clinician: Referring Rosalee Tolley: Treating  Yadira Hada/Extender: Jasmine Pang in Treatment: 10 Vital Signs Time Taken: 09:48 Temperature (F): 98.1 Height (in): 62 Pulse (bpm): 91 Weight (lbs): 221 Respiratory Rate (breaths/min): 18 Body Mass Index (BMI): 40.4 Blood Pressure (mmHg): 128/73 Reference Range: 80 - 120 mg / dl Electronic Signature(s) Signed: 11/12/2022 11:37:32 AM By: Dayton Scrape Entered By: Dayton Scrape on 11/12/2022 09:48:48

## 2022-11-19 ENCOUNTER — Encounter (HOSPITAL_BASED_OUTPATIENT_CLINIC_OR_DEPARTMENT_OTHER): Payer: Commercial Managed Care - PPO | Attending: General Surgery | Admitting: General Surgery

## 2022-11-19 DIAGNOSIS — L97928 Non-pressure chronic ulcer of unspecified part of left lower leg with other specified severity: Secondary | ICD-10-CM | POA: Diagnosis not present

## 2022-11-19 DIAGNOSIS — L817 Pigmented purpuric dermatosis: Secondary | ICD-10-CM | POA: Insufficient documentation

## 2022-11-19 DIAGNOSIS — I739 Peripheral vascular disease, unspecified: Secondary | ICD-10-CM | POA: Diagnosis not present

## 2022-11-19 DIAGNOSIS — L98499 Non-pressure chronic ulcer of skin of other sites with unspecified severity: Secondary | ICD-10-CM | POA: Insufficient documentation

## 2022-11-19 DIAGNOSIS — I87312 Chronic venous hypertension (idiopathic) with ulcer of left lower extremity: Secondary | ICD-10-CM | POA: Diagnosis not present

## 2022-11-19 DIAGNOSIS — Z6841 Body Mass Index (BMI) 40.0 and over, adult: Secondary | ICD-10-CM | POA: Diagnosis not present

## 2022-11-20 ENCOUNTER — Telehealth: Payer: Self-pay | Admitting: *Deleted

## 2022-11-20 ENCOUNTER — Ambulatory Visit: Payer: Self-pay | Admitting: General Surgery

## 2022-11-20 DIAGNOSIS — T8189XA Other complications of procedures, not elsewhere classified, initial encounter: Secondary | ICD-10-CM | POA: Diagnosis not present

## 2022-11-20 NOTE — Progress Notes (Signed)
RN: January 07, 1956 (67 y.o. [name], SURGES [name] ([id number]) [id number] Page 5 of 9 Primary Care [name]: [name] Other Clinician: Referring Britani Beattie: Treating Miaya Lafontant/Extender: Jasmine Pang in Treatment: 11 Active Problems Location of Pain Severity  and Description of Pain Patient Has Paino No Site Locations Pain Management and Medication Current Pain Management: Electronic Signature(s) Signed: 11/19/2022 11:01:58 AM By: Dayton Scrape Entered By: Dayton Scrape on 11/19/2022 06:50:35 -------------------------------------------------------------------------------- Patient/Caregiver Education Details Patient Name: Date of Service: [name] 10/7/2024andnbsp9:30 A M Medical Record Number: [id number] Patient Account Number: [id number] Date of Birth/Gender: Treating RN: 1956-02-11 (67 y.o. [name] Primary Care Physician: [name] Other Clinician: Referring Physician: Treating Physician/Extender: Jasmine Pang in Treatment: 11 Education Assessment Education Provided To: Patient Education Topics Provided Wound/Skin Impairment: Methods: Explain/Verbal Responses: State content correctly Electronic Signature(s) Signed: 11/19/2022 5:38:09 PM By: Karie Schwalbe RN Entered By: Karie Schwalbe on 11/19/2022 14:07:33 ELAISHA, ZAHNISER [name] ([id number]) [id number] Page 6 of 9 -------------------------------------------------------------------------------- Wound Assessment Details Patient Name: Date of Service: Erin, [name] 11/19/2022 9:30 A M Medical Record Number: [id number] Patient Account Number: [id number] Date of Birth/Sex: Treating RN: 1956/01/27 (67 y.o. F) Primary Care Joahan Swatzell: [name] Other Clinician: Referring Shafiq Larch: Treating Osiel Stick/Extender: Jasmine Pang in Treatment: 11 Wound Status Wound Number: 10 Primary Abscess Etiology: Wound Location: Left Abdomen - Lower Quadrant Wound Open Wounding Event: Surgical Injury Status: Date Acquired: 02/06/2017 Comorbid Cataracts, Lymphedema, Hypertension, Peripheral Venous Weeks Of Treatment: 6 History: Disease, Osteoarthritis Clustered Wound: No Photos Wound  Measurements Length: (cm) 2 Width: (cm) 1 Depth: (cm) 0.3 Area: (cm) 1.571 Volume: (cm) 0.471 % Reduction in Area: 38.5% % Reduction in Volume: -84.7% Epithelialization: Small (1-33%) Tunneling: No Undermining: No Wound Description Classification: Full Thickness Without Exposed Suppor Wound Margin: Distinct, outline attached Exudate Amount: Medium Exudate Type: Serosanguineous Exudate Color: red, brown t Structures Foul Odor After Cleansing: No Slough/Fibrino Yes Wound Bed Granulation Amount: Large (67-100%) Exposed Structure Granulation Quality: Red, Hyper-granulation, Friable Fascia Exposed: No Necrotic Amount: Small (1-33%) Fat Layer (Subcutaneous Tissue) Exposed: Yes Necrotic Quality: Adherent Slough Tendon Exposed: No Muscle Exposed: No Joint Exposed: No Bone Exposed: No Periwound Skin Texture Texture Color No Abnormalities Noted: No No Abnormalities Noted: Yes Scarring: Yes Temperature / Pain Temperature: No Abnormality Moisture No Abnormalities Noted: Yes Treatment Notes Wound #10 (Abdomen - Lower Quadrant) Wound Laterality: Left Cleanser Soap and [address] ALIANYS, CHACKO [name] ([id number]) [id number] Page 7 of 9 Discharge Instruction: May shower and wash wound with dial antibacterial soap and water prior to dressing change. Vashe 5.8 (oz) Discharge Instruction: Cleanse the wound with Vashe prior to applying a clean dressing using gauze sponges, not tissue or cotton balls. Wound Cleanser Discharge Instruction: Cleanse the wound with wound cleanser prior to applying a clean dressing using gauze sponges, not tissue or cotton balls. Peri-Wound Care Topical Primary Dressing Maxorb Extra Ag+ Alginate Dressing, 2x2 (in/in) Discharge Instruction: Apply to wound bed as instructed Secondary Dressing ABD Pad, 8x10 Discharge Instruction: Apply over primary dressing as directed. Secured With [employer] T 2x10  (in/yd) ape Discharge Instruction: Secure with tape as directed. Compression Wrap Compression Stockings Add-Ons Electronic Signature(s) Signed: 11/19/2022 5:38:09 PM By: Karie Schwalbe RN Entered By: Karie Schwalbe on 11/19/2022 07:11:32 -------------------------------------------------------------------------------- Wound Assessment Details Patient Name: Date of Service: [name] 11/19/2022 9:30 A M Medical Record Number: [id number] Patient Account Number: [id number] Date of Birth/Sex: Treating RN: 06-Jul-1955 (67 y.o. F) Primary Care Trishelle Devora:  [name] Other Clinician: Referring Anish Vana: Treating Helane Briceno/Extender: Jasmine Pang in Treatment: 11 Wound Status Wound Number: 9 Primary Vasculitis Etiology: Wound Location: Left, Distal, Anterior Lower Leg Wound Open Wounding Event: Gradually Appeared Status: Date Acquired: 07/14/2022 Comorbid Cataracts, Lymphedema, Hypertension, Peripheral Venous Weeks Of Treatment: 11 History: Disease, Osteoarthritis Clustered Wound: No Photos Wound Measurements Length: (cm) 0.4 Width: (cm) 0.3 Depth: (cm) 0.1 Nila, Dashae [name] ([id number]) Area: (cm) 0.094 Volume: (cm) 0.009 % Reduction in Area: 99% % Reduction in Volume: 99% Epithelialization: Medium (34-66%) [phone].pdf Page 8 of 9 Tunneling: No Undermining: No Wound Description Classification: Full Thickness Without Exposed Support Structures Wound Margin: Distinct, outline attached Exudate Amount: Medium Exudate Type: Serosanguineous Exudate Color: red, brown Foul Odor After Cleansing: No Slough/Fibrino Yes Wound Bed Granulation Amount: Large (67-100%) Exposed Structure Granulation Quality: Pink Fascia Exposed: No Necrotic Amount: Small (1-33%) Fat Layer (Subcutaneous Tissue) Exposed: Yes Necrotic Quality: Adherent Slough Tendon Exposed: No Muscle Exposed: No Joint Exposed: No Bone Exposed: No Periwound  Skin Texture Texture Color No Abnormalities Noted: No No Abnormalities Noted: No Scarring: Yes Hemosiderin Staining: Yes Moisture Temperature / Pain No Abnormalities Noted: Yes Temperature: No Abnormality Treatment Notes Wound #9 (Lower Leg) Wound Laterality: Left, Anterior, Distal Cleanser Vashe 5.8 (oz) Discharge Instruction: Cleanse the wound with Vashe prior to applying a clean dressing using gauze sponges, not tissue or cotton balls. Peri-Wound Care Topical Gentamicin Discharge Instruction: As directed by physician Mupirocin Ointment Discharge Instruction: Apply Mupirocin (Bactroban) as instructed Primary Dressing Maxorb Extra Ag+ Alginate Dressing, 4x4.75 (in/in) Discharge Instruction: Apply to wound bed as instructed Secondary Dressing ABD Pad, 8x10 Discharge Instruction: Apply over primary dressing as directed. Secured With Compression Wrap FourPress (4 layer compression wrap) Discharge Instruction: Apply four layer compression as directed. May also use Urgo K2 compression system as alternative. Unnaboot w/Calamine, 4x10 (in/yd) Discharge Instruction: Apply Unnaboot as directed. Compression Stockings Add-Ons Electronic Signature(s) Signed: 11/19/2022 5:38:09 PM By: Karie Schwalbe RN Entered By: Karie Schwalbe on 11/19/2022 07:11:13 [name], [name] ([id number]) [id number] Page 9 of 9 -------------------------------------------------------------------------------- Vitals Details Patient Name: Date of Service: [name], [name] 11/19/2022 9:30 A M Medical Record Number: [id number] Patient Account Number: [id number] Date of Birth/Sex: Treating RN: 03/16/1955 (67 y.o. F) Primary Care Warnell Rasnic: [name] Other Clinician: Referring Nishaan Stanke: Treating Ilianna Bown/Extender: Jasmine Pang in Treatment: 11 Vital Signs Time Taken: 09:50 Temperature (F): 97.8 Height (in): 62 Pulse (bpm): 79 Weight (lbs):  221 Respiratory Rate (breaths/min): 18 Body Mass Index (BMI): 40.4 Blood Pressure (mmHg): 138/81 Reference Range: 80 - 120 mg / dl Electronic Signature(s) Signed: 11/19/2022 11:01:58 AM By: Dayton Scrape Entered By: Dayton Scrape on 11/19/2022 06:50:28  [id number] Date of Birth/Sex: Treating RN: June 09, 1955 (67 y.o. F) Primary Care Trenity Pha: [name] Other Clinician: Referring Deavion Strider: Treating Wolfe Camarena/Extender: Jasmine Pang in Treatment: 11 Vital Signs Height(in): 62 Pulse(bpm): 79 Weight(lbs): 221 Blood Pressure(mmHg): 138/81 Body Mass Index(BMI): 40.4 Temperature(F): 97.8 Respiratory Rate(breaths/min): 18 [10:Photos:] [N/A:N/A] Left Abdomen - Lower Quadrant Left, Distal, Anterior Lower Leg N/A Wound Location: Surgical Injury Gradually Appeared N/A Wounding Event: Abscess Vasculitis N/A Primary Etiology: Cataracts, Lymphedema, Cataracts, Lymphedema, N/A Comorbid History: Hypertension, Peripheral Venous Hypertension, Peripheral Venous Disease, Osteoarthritis Disease, Osteoarthritis 02/06/2017 07/14/2022 N/A Date Acquired: 6 11 N/A Weeks of Treatment: Open Open N/A Wound Status: No No N/A Wound Recurrence: 2x1x0.3 0.4x0.3x0.1 N/A Measurements L x W x [name] 1.571 0.094 N/A A (cm) : rea 0.471 0.009 N/A Volume (cm) : 38.50% 99.00%  N/A % Reduction in A rea: -84.70% 99.00% N/A % Reduction in Volume: Full Thickness Without Exposed Full Thickness Without Exposed N/A Classification: Support Structures Support Structures Medium Medium N/A Exudate A mount: Serosanguineous Serosanguineous N/A Exudate Type: red, brown red, brown N/A Exudate Color: Distinct, outline attached Distinct, outline attached N/A Wound Margin: Large (67-100%) Large (67-100%) N/A Granulation A mount: Red, Hyper-granulation, Friable Pink N/A Granulation Quality: Small (1-33%) Small (1-33%) N/A Necrotic A mount: Fat Layer (Subcutaneous Tissue): Yes Fat Layer (Subcutaneous Tissue): Yes N/A Exposed Structures: Fascia: No Fascia: No Tendon: No Tendon: No Muscle: No Muscle: No Joint: No Joint: No Bone: No Bone: No Small (1-33%) Medium (34-66%) N/A Epithelialization: N/A Debridement - Selective/Open Wound N/A Debridement: Pre-procedure Verification/Time Out N/A 10:17 N/A Taken: N/A Lidocaine 4% Topical Solution N/A Pain Control: N/A Slough N/A Tissue Debrided: N/A Non-Viable Tissue N/A Level: N/A 0.09 N/A Debridement A (sq cm): rea N/A Curette N/A Instrument: N/A Minimum N/A Bleeding: N/A Pressure N/A Hemostasis A chieved: N/A 0 N/A Procedural Pain: N/A 0 N/A Post Procedural Pain: N/A Procedure was tolerated well N/A Debridement Treatment Response: N/A 0.4x0.3x0.1 N/A Post Debridement Measurements L x W x [name] N/A 0.009 N/A Post Debridement Volume: (cm) ALLISSON, SCHINDEL [name] ([id number]) [phone].pdf Page 4 of 9 Scarring: Yes Scarring: Yes N/A Periwound Skin Texture: No Abnormalities Noted No Abnormalities Noted N/A Periwound Skin Moisture: No Abnormalities Noted Hemosiderin Staining: Yes N/A Periwound Skin Color: No Abnormality No Abnormality N/A Temperature: N/A Compression Therapy N/A Procedures Performed: Debridement Treatment Notes Electronic Signature(s) Signed: 11/19/2022  10:31:33 AM By: [name] MD FACS Entered By: [name] on 11/19/2022 07:31:33 -------------------------------------------------------------------------------- Multi-Disciplinary Care Plan Details Patient Name: Date of Service: [name] 11/19/2022 9:30 A M Medical Record Number: [id number] Patient Account Number: [id number] Date of Birth/Sex: Treating RN: 09/19/1955 (67 y.o. [name] Primary Care Arihaan Bellucci: [name] Other Clinician: Referring Marcela Alatorre: Treating Rickayla Wieland/Extender: Jasmine Pang in Treatment: 11 Active Inactive Wound/Skin Impairment Nursing Diagnoses: Impaired tissue integrity Goals: Patient/caregiver will verbalize understanding of skin care regimen Date Initiated: 09/03/2022 Target Resolution Date: 01/12/2023 Goal Status: Active Interventions: Assess patient/caregiver ability to obtain necessary supplies Assess patient/caregiver ability to perform ulcer/skin care regimen upon admission and as needed Assess ulceration(s) every visit Provide education on ulcer and skin care Screen for HBO Treatment Activities: Skin care regimen initiated : 08/31/2022 Topical wound management initiated : 08/31/2022 Notes: Electronic Signature(s) Signed: 11/19/2022 5:38:09 PM By: Karie Schwalbe RN Entered By: Karie Schwalbe on 11/19/2022 14:07:03 -------------------------------------------------------------------------------- Pain Assessment Details Patient Name: Date of Service: [name] 11/19/2022 9:30 A M Medical Record Number: [id number] Patient Account Number: [id number] Date of Birth/Sex: Treating  [name] Other Clinician: Referring Anish Vana: Treating Helane Briceno/Extender: Jasmine Pang in Treatment: 11 Wound Status Wound Number: 9 Primary Vasculitis Etiology: Wound Location: Left, Distal, Anterior Lower Leg Wound Open Wounding Event: Gradually Appeared Status: Date Acquired: 07/14/2022 Comorbid Cataracts, Lymphedema, Hypertension, Peripheral Venous Weeks Of Treatment: 11 History: Disease, Osteoarthritis Clustered Wound: No Photos Wound Measurements Length: (cm) 0.4 Width: (cm) 0.3 Depth: (cm) 0.1 Nila, Dashae [name] ([id number]) Area: (cm) 0.094 Volume: (cm) 0.009 % Reduction in Area: 99% % Reduction in Volume: 99% Epithelialization: Medium (34-66%) [phone].pdf Page 8 of 9 Tunneling: No Undermining: No Wound Description Classification: Full Thickness Without Exposed Support Structures Wound Margin: Distinct, outline attached Exudate Amount: Medium Exudate Type: Serosanguineous Exudate Color: red, brown Foul Odor After Cleansing: No Slough/Fibrino Yes Wound Bed Granulation Amount: Large (67-100%) Exposed Structure Granulation Quality: Pink Fascia Exposed: No Necrotic Amount: Small (1-33%) Fat Layer (Subcutaneous Tissue) Exposed: Yes Necrotic Quality: Adherent Slough Tendon Exposed: No Muscle Exposed: No Joint Exposed: No Bone Exposed: No Periwound  Skin Texture Texture Color No Abnormalities Noted: No No Abnormalities Noted: No Scarring: Yes Hemosiderin Staining: Yes Moisture Temperature / Pain No Abnormalities Noted: Yes Temperature: No Abnormality Treatment Notes Wound #9 (Lower Leg) Wound Laterality: Left, Anterior, Distal Cleanser Vashe 5.8 (oz) Discharge Instruction: Cleanse the wound with Vashe prior to applying a clean dressing using gauze sponges, not tissue or cotton balls. Peri-Wound Care Topical Gentamicin Discharge Instruction: As directed by physician Mupirocin Ointment Discharge Instruction: Apply Mupirocin (Bactroban) as instructed Primary Dressing Maxorb Extra Ag+ Alginate Dressing, 4x4.75 (in/in) Discharge Instruction: Apply to wound bed as instructed Secondary Dressing ABD Pad, 8x10 Discharge Instruction: Apply over primary dressing as directed. Secured With Compression Wrap FourPress (4 layer compression wrap) Discharge Instruction: Apply four layer compression as directed. May also use Urgo K2 compression system as alternative. Unnaboot w/Calamine, 4x10 (in/yd) Discharge Instruction: Apply Unnaboot as directed. Compression Stockings Add-Ons Electronic Signature(s) Signed: 11/19/2022 5:38:09 PM By: Karie Schwalbe RN Entered By: Karie Schwalbe on 11/19/2022 07:11:13 [name], [name] ([id number]) [id number] Page 9 of 9 -------------------------------------------------------------------------------- Vitals Details Patient Name: Date of Service: [name], [name] 11/19/2022 9:30 A M Medical Record Number: [id number] Patient Account Number: [id number] Date of Birth/Sex: Treating RN: 03/16/1955 (67 y.o. F) Primary Care Warnell Rasnic: [name] Other Clinician: Referring Nishaan Stanke: Treating Ilianna Bown/Extender: Jasmine Pang in Treatment: 11 Vital Signs Time Taken: 09:50 Temperature (F): 97.8 Height (in): 62 Pulse (bpm): 79 Weight (lbs):  221 Respiratory Rate (breaths/min): 18 Body Mass Index (BMI): 40.4 Blood Pressure (mmHg): 138/81 Reference Range: 80 - 120 mg / dl Electronic Signature(s) Signed: 11/19/2022 11:01:58 AM By: Dayton Scrape Entered By: Dayton Scrape on 11/19/2022 06:50:28

## 2022-11-20 NOTE — H&P (Signed)
REFERRING PHYSICIAN:  [name], MD  PROVIDER:  Elenora Gamma, MD  MRN: [id number] DOB: Jun 02, 1955 DATE OF ENCOUNTER: 11/20/2022  Subjective  Chief Complaint: Colostomy     History of Present Illness: [name] is a 67 y.o. female who is seen today as an office consultation at the request of Dr. Lady Gary for evaluation of Colostomy .    Patient underwent a laparoscopic converted to open colostomy reversal with small bowel resection by me in December 2018.  Patient has chronic lymphedema and is seen regularly at the wound clinic for lower extremity wounds.  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   Review of Systems: A complete review of systems was obtained from the patient.  I have reviewed this information and discussed as appropriate with the patient.  See HPI as well for other ROS.   Medical History: Past Medical History: Diagnosis Date  GERD (gastroesophageal reflux disease)   Hypertension   Thyroid disease    There is no problem list on file for this patient.   Past Surgical History: Procedure Laterality Date  LAPAROSCOPY converted to open  sigmoid colon resection with colostomy  SMALL BOWEL RESECTION repair of small bowel x 2   07/10/2016  Dr. Edythe [name]  LAPAROSCOPIC CONVERTED OPEN COLOSTOMY REVISION WITH SMALL BOWEL RESECTION  02/06/2017  Dr. Clovis [name]    No Known Allergies  Current Outpatient Medications on File Prior to Visit Medication Sig Dispense Refill  amLODIPine (NORVASC) 5 MG tablet Take 5 mg by mouth once daily    amoxicillin-clavulanate (AUGMENTIN) 875-125 mg tablet Take 1 tablet by mouth 2 (two) times daily    levothyroxine (SYNTHROID) 112 MCG tablet Take 112 mcg by mouth every morning before breakfast (0630)    lisinopriL (ZESTRIL) 20 MG tablet Take 20 mg by mouth once daily    simvastatin (ZOCOR) 20 MG tablet Take 20 mg by mouth once daily    TORsemide (DEMADEX) 20 MG tablet Take by mouth    multivitamin  (MULTIPLE VITAMINS DAILY ORAL) Take by mouth    potassium chloride (KLOR-CON) 10 MEQ ER tablet Take 10 mEq by mouth once daily    No current facility-administered medications on file prior to visit.   Family History Problem Relation Age of Onset  High blood pressure (Hypertension) Mother   Hyperlipidemia (Elevated cholesterol) Mother   Coronary Artery Disease (Blocked arteries around heart) Mother   Coronary Artery Disease (Blocked arteries around heart) Father   Hyperlipidemia (Elevated cholesterol) Father   High blood pressure (Hypertension) Father     Social History  Tobacco Use Smoking Status Never Smokeless Tobacco Never    Social History  Socioeconomic History  Marital status: Single Tobacco Use  Smoking status: Never  Smokeless tobacco: Never Substance and Sexual Activity  Alcohol use: Never  Drug use: Never  Social Determinants of Health  Financial Resource Strain: Low Risk  (05/17/2022)  Received from [facility]  Overall Financial Resource Strain (CARDIA)   Difficulty of Paying Living Expenses: Not hard at all Food Insecurity: No Food Insecurity (05/17/2022)  [facility] from [facility]  Hunger Vital Sign   Worried About Running Out of Food in the Last Year: Never true   Ran Out of Food in the Last Year: Never true Transportation Needs: No Transportation Needs (05/17/2022)  Received from [facility] - Transportation   Lack of Transportation (Medical): No   Lack of Transportation (Non-Medical): No Physical Activity: Unknown (05/17/2022)  Received from [facility]  Exercise Vital Sign   Days of Exercise per Week: Patient declined Stress: No Stress Concern Present (05/17/2022)  Received from [facility] - Occupational Stress Questionnaire   Feeling of Stress : Only a little Social Connections: Moderately Isolated (05/17/2022)  Received from [facility]  Social Connection and Isolation Panel [NHANES]   Frequency  of Communication with Friends and Family: More than three times a week   Frequency of Social Gatherings with Friends and Family: More than three times a week   Attends Religious Services: 1 to 4 times per year   Active Member of Clubs or Organizations: No   Marital Status: Never married   Objective:   Vitals:  11/20/22 1127 Pulse: 96 Temp: 37 C (98.6 F) SpO2: 98% Weight: (!) 105.1 kg (231 lb 9.6 oz) Height: 157.5 cm (5\' 2" ) PainSc: 0-No pain    Exam Gen: NAD Abd: soft Left lower quadrant wound approximately 1 cm in size and approximately 3 cm deep.  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  I recommend wound exploration and suture removal under anesthesia.  We will then packed the wound, and this should allow for it to heal.  Patient agreeable to procedure.  Will schedule as possible.   Vanita Panda, MD Colon and Rectal Surgery [facility]

## 2022-11-20 NOTE — Telephone Encounter (Signed)
Pre-operative Risk Assessment    Patient Name: MUNNI CONLEY  DOB: 02-03-1956 MRN: [id number]      Request for Surgical Clearance    Procedure:   Wound exploration and suture removal  Date of Surgery:  Clearance TBD                                 Surgeon:  Dr. Romie Levee Surgeon's Group or Practice Name:  [facility] Phone number:  [phone] Fax number:  [phone]   Type of Clearance Requested:   - Medical    Type of Anesthesia:  MAC   Additional requests/questions:    Signed, Emmit Pomfret   11/20/2022, 3:34 PM

## 2022-11-20 NOTE — Progress Notes (Signed)
Erin, [name] ([id number]) [id number] Page 1 of 14 Visit Report for 11/19/2022 Chief Complaint Document Details Patient Name: Date of Service: [name], DRESDEN 11/19/2022 9:30 A M Medical Record Number: [id number] Patient Account Number: [id number] Date of Birth/Sex: Treating RN: 1955-11-26 (67 y.o. F) Primary Care Provider: [name] Other Clinician: Referring Provider: Treating Provider/Extender: Jasmine Pang in Treatment: 11 Information Obtained from: Patient Chief Complaint 04/19/2021: The patient is here for ongoing follow-up regarding 2 left lower extremity wounds. 01/22/2022; patient returns to clinic with a wound on the left anterior lower leg secondary to trauma Electronic Signature(s) Signed: 11/19/2022 10:31:42 AM By: [name] MD FACS Entered By: [name] on 11/19/2022 07:31:41 -------------------------------------------------------------------------------- Debridement Details Patient Name: Date of Service: [name] [name] 11/19/2022 9:30 A M Medical Record Number: [id number] Patient Account Number: [id number] Date of Birth/Sex: Treating RN: Jun 17, 1955 (67 y.o. [name] Primary Care Provider: [name] Other Clinician: Referring Provider: Treating Provider/Extender: Jasmine Pang in Treatment: 11 Debridement Performed for Assessment: Wound #9 Left,Distal,Anterior Lower Leg Performed By: Physician [name], MD The following information was scribed by: Karie Schwalbe The information was scribed for: [name] Debridement Type: Debridement Level of Consciousness (Pre-procedure): Awake and Alert Pre-procedure Verification/Time Out Yes - 10:17 Taken: Start Time: 10:17 Pain Control: Lidocaine 4% T opical Solution Percent of Wound Bed Debrided: 100% T Area Debrided (cm): otal 0.09 Tissue and other material debrided: Non-Viable, Slough,  Slough Level: Non-Viable Tissue Debridement Description: Selective/Open Wound Instrument: Curette Bleeding: Minimum Hemostasis Achieved: Pressure End Time: 10:19 Procedural Pain: 0 Post Procedural Pain: 0 Response to Treatment: Procedure was tolerated well Level of Consciousness (Post- Awake and Alert procedure): Post Debridement Measurements of Total Wound Length: (cm) 0.4 Width: (cm) 0.3 Depth: (cm) 0.1 Erin, [name] D ([id number]) [id number] Page 2 of 14 Volume: (cm) 0.009 Character of Wound/Ulcer Post Debridement: Improved Post Procedure Diagnosis Same as Pre-procedure Electronic Signature(s) Signed: 11/19/2022 10:35:48 AM By: [name] MD FACS Signed: 11/19/2022 5:38:09 PM By: Karie Schwalbe RN Entered By: Karie Schwalbe on 11/19/2022 07:20:31 -------------------------------------------------------------------------------- HPI Details Patient Name: Date of Service: [name] D. 11/19/2022 9:30 A M Medical Record Number: [id number] Patient Account Number: [id number] Date of Birth/Sex: Treating RN: 1955/06/18 (67 y.o. 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 She tells [name] that she fell on the gravel driveway in July. She developed injuries on her distal lower leg which have not healed. She saw her primary physician on 11/15/2017 who noted her left shin injuries. Gave her antibiotics. At that point the wounds were almost circumferential however most were less than 1.5 cm. Weeping edema fluid was noted. She was referred here for evaluation. The patient has a history of chronic lower extremity edema. 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 She has had prior history with leg weeping fluid. She does not wear compression stockings. She is not doing anything specific to these wound areas. 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 She has chronic venous inflammation/stasis dermatitis which is  D. 11/19/2022 9:30 A M Medical Record Number: [id number] Patient Account Number: [id number] Date of Birth/Sex: Treating RN: July 23, 1955 (67 y.o. 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 11/19/2022 9:30 A M Medical Record Number: [id number] Patient Account Number: [id number] Date of Birth/Sex: Treating RN: [birth year] (67 y.o. F) Primary Care Provider: [name] Other Clinician: Referring Provider: Treating Provider/Extender: Jasmine Pang in Treatment: [address], [place] D ([id number]) [id number] Page 8 of 14 Chief Complaint Information obtained from Patient 04/19/2021: The patient is here for ongoing follow-up regarding 2 left lower extremity wounds. 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 She tells [name] that she fell on the gravel driveway in July. She developed injuries on her distal lower leg which have not healed. She saw her primary physician on 11/15/2017 who noted her left shin injuries. Gave her antibiotics. At that point the wounds were almost circumferential however most were less than 1.5 cm. Weeping edema fluid was noted. She was referred here for evaluation. The patient has a history of chronic lower extremity edema. 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 She has had prior history with leg weeping fluid. She does not wear compression  stockings. She is not doing anything specific to these wound areas. 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 She also has lymphedema. Put her in 3 layer compression and silver alginate last week. She has 3 small wounds with depth just lateral to the tibia. More problematically than this she has numerous shallow areas some of which are almost canal like in shape with tightly adherent painful debris. It would be very difficult and time- consuming to go through this and attempt to individually debride all these areas.. I changed her to collagen today to see if that would help with any of the surface debris on some of these wounds. Otherwise we will not be able to put this in compression we have to have someone change the dressing. The patient is not eligible for home health 12/25/17 on evaluation today patient actually appears to be doing rather well in regard to the ulcer on her lower extremity. Fortunately there does not appear to be evidence of infection at this time. She has been tolerating the dressing changes without complication. This includes the compression wrap. The only issue she had was that the wrap was initially placed over her bunion  Caffeine Use: Daily - coffee; Financial Concerns: No; Food, Clothing or Shelter Needs: No; Support System Lacking: No; Transportation Concerns: No Electronic Signature(s) Signed: 11/19/2022 10:35:48 AM By: [name] MD FACS Entered By: [name] on 11/19/2022 07:33:00 -------------------------------------------------------------------------------- SuperBill Details Patient Name: Date of Service: [name] D. 11/19/2022 Medical Record Number: [id number] Patient Account Number: [id number] Date of Birth/Sex: Treating RN: August 04, 1955 (67 y.o. 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 with other specif Modifier: ied severity Quantity: 1 Physician Procedures : CPT4 Code Description Modifier [id number] 99214 - WC PHYS LEVEL 4 - EST PT 25 ICD-10 Diagnosis Description L97.928 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 with other specified severity L98.499 Non-pressure chronic ulcer of skin of  other sites with unspecified severity 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 Prim Dressing: Maxorb Extra Ag+ Alginate Dressing, 2x2 (in/in) 1 x Per Day/30 Days ary Discharge Instructions: Apply to wound bed as instructed Secondary Dressing: ABD Pad, 8x10 1 x Per Day/30 Days Discharge Instructions: Apply over primary dressing as directed. Secured With: 30M Medipore Scientist, research (life sciences) Surgical T 2x10 (in/yd) 1 x Per Day/30 Days ape Discharge Instructions: Secure with tape as directed. WOUND #9: - Lower Leg Wound Laterality: Left, Anterior, Distal Cleanser: Vashe 5.8 (oz) 1 x Per Week/30 Days Discharge Instructions: Cleanse the wound with Vashe prior to applying a clean dressing using gauze sponges, not tissue or cotton balls. Topical: Gentamicin 1 x Per Week/30 Days Discharge Instructions: As directed by physician Topical: Mupirocin Ointment 1 x Per Week/30 Days Discharge Instructions: Apply Mupirocin (Bactroban) as instructed Prim Dressing: Maxorb Extra Ag+ Alginate Dressing, 4x4.75 (in/in) 1 x Per Week/30 Days ary Discharge Instructions: Apply to wound bed as instructed Secondary Dressing: ABD Pad, 8x10 1 x Per Week/30 Days Discharge Instructions: Apply over primary dressing as directed. Com pression Wrap: FourPress (4 layer compression wrap) 1 x Per Week/30 Days Discharge Instructions: Apply four layer compression as directed. May also use Urgo K2 compression system as alternative. Com pression Wrap: Unnaboot w/Calamine, 4x10 (in/yd) 1 x Per Week/30 Days Discharge Instructions: Apply Unnaboot as directed. 11/19/2022: The leg wound  is nearly healed. There is thin slough on the surface. The old stoma site remains stable. She sees Dr. Maisie Fus tomorrow. I used a curette to debride slough off of the leg wound. I did not address the abdominal wound today, as she will be seen by general surgery tomorrow. We will continue topical gentamicin and mupirocin with silver alginate and Urgo 4-layer compression equivalent wrap for the leg and silver alginate on the abdomen. Follow-up in 1 week. Electronic Signature(s) Signed: 11/19/2022 10:34:56 AM By: [name] MD FACS NIOMA, MCCUBBINS D ([id number]) AM By: [name] MD FACS [id number].pdf Page 13 of 14 Signed: 11/19/2022 10:34:56 Entered By: [name] on 11/19/2022 07:34:55 -------------------------------------------------------------------------------- HxROS Details Patient Name: Date of Service: MARIBELL, DEMEO D. 11/19/2022 9:30 A M Medical Record Number: [id number] Patient Account Number: [id number] Date of Birth/Sex: Treating RN: [birth year] (67 y.o. F) Primary Care Provider: [name] Other Clinician: Referring Provider: Treating Provider/Extender: Jasmine Pang in Treatment: 11 Information Obtained From Patient Constitutional Symptoms (General Health) Medical History: Past Medical History Notes: morbid obesity Eyes Medical History: Positive for: Cataracts Hematologic/Lymphatic Medical History: Positive for: Lymphedema Cardiovascular Medical History: Positive for: Hypertension; Peripheral Venous Disease Past Medical History Notes: schamberg disease, hyperlipidemia Gastrointestinal Medical History: Past Medical History Notes: diverticulitis , h/o obstruction due to diverticulitis , colostomy and colostomy reversal Endocrine Medical History: Past Medical History Notes: hypothyroidism Integumentary (Skin) Medical History: Negative for: History of Burn Musculoskeletal Medical  History: Positive for: Osteoarthritis HBO Extended History Items Eyes: Cataracts Immunizations Pneumococcal Vaccine: Received Pneumococcal Vaccination: Yes Received Pneumococcal Vaccination On or After [address] [place] ([id number]) [id number] Page 14 of 14 Implantable Devices None Hospitalization / Surgery History Type of Hospitalization/Surgery colostomy reversal 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 Family and Social History Cancer: No; Diabetes: No; Heart Disease: Yes - Mother,Father; Hereditary Spherocytosis: No; Hypertension: Yes - Mother,Father; Kidney Disease: Yes - Mother; Lung Disease: No; Seizures: No; Stroke: No; Thyroid Problems: Yes - Mother; Tuberculosis: No; Never smoker; Marital Status - Single; Alcohol Use: Never; Drug Use: No History;  Erin, [name] ([id number]) [id number] Page 1 of 14 Visit Report for 11/19/2022 Chief Complaint Document Details Patient Name: Date of Service: [name], DRESDEN 11/19/2022 9:30 A M Medical Record Number: [id number] Patient Account Number: [id number] Date of Birth/Sex: Treating RN: 1955-11-26 (67 y.o. F) Primary Care Provider: [name] Other Clinician: Referring Provider: Treating Provider/Extender: Jasmine Pang in Treatment: 11 Information Obtained from: Patient Chief Complaint 04/19/2021: The patient is here for ongoing follow-up regarding 2 left lower extremity wounds. 01/22/2022; patient returns to clinic with a wound on the left anterior lower leg secondary to trauma Electronic Signature(s) Signed: 11/19/2022 10:31:42 AM By: [name] MD FACS Entered By: [name] on 11/19/2022 07:31:41 -------------------------------------------------------------------------------- Debridement Details Patient Name: Date of Service: [name] [name] 11/19/2022 9:30 A M Medical Record Number: [id number] Patient Account Number: [id number] Date of Birth/Sex: Treating RN: Jun 17, 1955 (67 y.o. [name] Primary Care Provider: [name] Other Clinician: Referring Provider: Treating Provider/Extender: Jasmine Pang in Treatment: 11 Debridement Performed for Assessment: Wound #9 Left,Distal,Anterior Lower Leg Performed By: Physician [name], MD The following information was scribed by: Karie Schwalbe The information was scribed for: [name] Debridement Type: Debridement Level of Consciousness (Pre-procedure): Awake and Alert Pre-procedure Verification/Time Out Yes - 10:17 Taken: Start Time: 10:17 Pain Control: Lidocaine 4% T opical Solution Percent of Wound Bed Debrided: 100% T Area Debrided (cm): otal 0.09 Tissue and other material debrided: Non-Viable, Slough,  Slough Level: Non-Viable Tissue Debridement Description: Selective/Open Wound Instrument: Curette Bleeding: Minimum Hemostasis Achieved: Pressure End Time: 10:19 Procedural Pain: 0 Post Procedural Pain: 0 Response to Treatment: Procedure was tolerated well Level of Consciousness (Post- Awake and Alert procedure): Post Debridement Measurements of Total Wound Length: (cm) 0.4 Width: (cm) 0.3 Depth: (cm) 0.1 Erin, [name] D ([id number]) [id number] Page 2 of 14 Volume: (cm) 0.009 Character of Wound/Ulcer Post Debridement: Improved Post Procedure Diagnosis Same as Pre-procedure Electronic Signature(s) Signed: 11/19/2022 10:35:48 AM By: [name] MD FACS Signed: 11/19/2022 5:38:09 PM By: Karie Schwalbe RN Entered By: Karie Schwalbe on 11/19/2022 07:20:31 -------------------------------------------------------------------------------- HPI Details Patient Name: Date of Service: [name] D. 11/19/2022 9:30 A M Medical Record Number: [id number] Patient Account Number: [id number] Date of Birth/Sex: Treating RN: 1955/06/18 (67 y.o. 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 She tells [name] that she fell on the gravel driveway in July. She developed injuries on her distal lower leg which have not healed. She saw her primary physician on 11/15/2017 who noted her left shin injuries. Gave her antibiotics. At that point the wounds were almost circumferential however most were less than 1.5 cm. Weeping edema fluid was noted. She was referred here for evaluation. The patient has a history of chronic lower extremity edema. 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 She has had prior history with leg weeping fluid. She does not wear compression stockings. She is not doing anything specific to these wound areas. 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 She has chronic venous inflammation/stasis dermatitis which is  Erin, [name] ([id number]) [id number] Page 1 of 14 Visit Report for 11/19/2022 Chief Complaint Document Details Patient Name: Date of Service: [name], DRESDEN 11/19/2022 9:30 A M Medical Record Number: [id number] Patient Account Number: [id number] Date of Birth/Sex: Treating RN: 1955-11-26 (67 y.o. F) Primary Care Provider: [name] Other Clinician: Referring Provider: Treating Provider/Extender: Jasmine Pang in Treatment: 11 Information Obtained from: Patient Chief Complaint 04/19/2021: The patient is here for ongoing follow-up regarding 2 left lower extremity wounds. 01/22/2022; patient returns to clinic with a wound on the left anterior lower leg secondary to trauma Electronic Signature(s) Signed: 11/19/2022 10:31:42 AM By: [name] MD FACS Entered By: [name] on 11/19/2022 07:31:41 -------------------------------------------------------------------------------- Debridement Details Patient Name: Date of Service: [name] [name] 11/19/2022 9:30 A M Medical Record Number: [id number] Patient Account Number: [id number] Date of Birth/Sex: Treating RN: Jun 17, 1955 (67 y.o. [name] Primary Care Provider: [name] Other Clinician: Referring Provider: Treating Provider/Extender: Jasmine Pang in Treatment: 11 Debridement Performed for Assessment: Wound #9 Left,Distal,Anterior Lower Leg Performed By: Physician [name], MD The following information was scribed by: Karie Schwalbe The information was scribed for: [name] Debridement Type: Debridement Level of Consciousness (Pre-procedure): Awake and Alert Pre-procedure Verification/Time Out Yes - 10:17 Taken: Start Time: 10:17 Pain Control: Lidocaine 4% T opical Solution Percent of Wound Bed Debrided: 100% T Area Debrided (cm): otal 0.09 Tissue and other material debrided: Non-Viable, Slough,  Slough Level: Non-Viable Tissue Debridement Description: Selective/Open Wound Instrument: Curette Bleeding: Minimum Hemostasis Achieved: Pressure End Time: 10:19 Procedural Pain: 0 Post Procedural Pain: 0 Response to Treatment: Procedure was tolerated well Level of Consciousness (Post- Awake and Alert procedure): Post Debridement Measurements of Total Wound Length: (cm) 0.4 Width: (cm) 0.3 Depth: (cm) 0.1 Erin, [name] D ([id number]) [id number] Page 2 of 14 Volume: (cm) 0.009 Character of Wound/Ulcer Post Debridement: Improved Post Procedure Diagnosis Same as Pre-procedure Electronic Signature(s) Signed: 11/19/2022 10:35:48 AM By: [name] MD FACS Signed: 11/19/2022 5:38:09 PM By: Karie Schwalbe RN Entered By: Karie Schwalbe on 11/19/2022 07:20:31 -------------------------------------------------------------------------------- HPI Details Patient Name: Date of Service: [name] D. 11/19/2022 9:30 A M Medical Record Number: [id number] Patient Account Number: [id number] Date of Birth/Sex: Treating RN: 1955/06/18 (67 y.o. 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 She tells [name] that she fell [place] gravel driveway in July. She developed injuries on her distal lower leg which have not healed. She saw her primary physician on 11/15/2017 who noted her left shin injuries. Gave her antibiotics. At that point the wounds were almost circumferential however most were less than 1.5 cm. Weeping edema fluid was noted. She was referred here for evaluation. The patient has a history of chronic lower extremity edema. 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 She has had prior history with leg weeping fluid. She does not wear compression stockings. She is not doing anything specific to these wound areas. 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 She has chronic venous inflammation/stasis dermatitis which is  09/10/2022: The small distal cluster of wounds has closed. She has a new wound proximal to the main wound. It is superficial and limited to breakdown of skin and appears secondary to friction. The main wound has a fibrotic surface with slough accumulation. Edema control is acceptable. 09/17/2022: The proximal wound has eschar on the surface. Underneath, there is a little bit of slough. The fibrotic surface of the main wound has not really improved, although there is some granulation tissue in the center. Edema control is good. 10/01/2022: The proximal wound is almost completely closed with just a glisten under illumination suggesting that it is not completely healed. The more distal wound is smaller by about half a  centimeter. There is slough accumulation on the surface and it is a little bit less fibrotic today. 10/08/2022: The proximal wound is healed. The more distal wound is smaller. There is some slough accumulation and a little bit of eschar around the edges. Today, the patient pointed out a wound on her left lower abdomen that has been present since a colostomy reversal in 2018. She has not ever discussed this with the surgeon that performed her ostomy takedown and this is the first time she has mentioned it to me. The tissue was very friable and apparently her PCP just started her on Augmentin, although I do not see any obvious signs of infection. 10/16/2022: The leg wound is smaller again today and more superficial. It is much cleaner with very little slough on the surface. The old ostomy site still has hypertrophic granulation tissue present. She is awaiting approval from insurance to see Dr. Maisie Fus, the surgeon that performed her ostomy reversal. 10/22/2022: The leg wound continues to contract. There is minimal slough on the surface. Edema control is good. The old ostomy site looks about the same, but the hypertrophic granulation tissue is less prominent today. 10/29/2022: She has a new leg wound just proximal and lateral to the existing site. There is a little bit of slough on the surface. It looks as though perhaps some skin tore here. The existing leg wound is more superficial and also has a layer of slough on the surface. The old ostomy site is stable with some slough present. She still does not have an appointment with surgery. 11/05/2022: The satellite wound that was new last week is closed. The initial leg wound is smaller and more superficial with minimal slough on the surface. The old ostomy site is stable with some slough present. Apparently the patient has been waiting on her PCP to make a referral to surgery, rather than contacting them herself, even though she has been an established  patient. 11/12/2022: The leg wound continues to be more superficial. There is some eschar that has built up around the edges. The old ostomy site is stable. She has an appointment with general surgery on October 8. 11/19/2022: The leg wound is nearly healed. There is thin slough on the surface. The old stoma site remains stable. She sees Dr. Maisie Fus tomorrow. Electronic Signature(s) Signed: 11/19/2022 10:32:41 AM By: [name] MD FACS Entered By: [name] on 11/19/2022 07:32:40 -------------------------------------------------------------------------------- Physical Exam Details Patient Name: Date of Service: [name] [name] 11/19/2022 9:30 A M Medical Record Number: [id number] Patient Account Number: [id number] Date of Birth/Sex: Treating RN: 09-18-1955 (67 y.o. F) Primary Care Provider: [name] Other Clinician: Referring Provider: Treating Provider/Extender: Jasmine Pang in Treatment: 11 Constitutional . . . . no acute distress. Respiratory Normal work of breathing on  room air. Notes 11/19/2022: The leg wound is nearly healed. There is thin slough on the surface. The old stoma site remains stable. Electronic Signature(s) Signed: 11/19/2022 10:33:52 AM By: [name] MD FACS Entered By: [name] on 11/19/2022 07:33:52 CHENITA, RUDA D ([id number]) [id number] Page 6 of 14 -------------------------------------------------------------------------------- Physician Orders Details Patient Name: Date of Service: GEENA, WEINHOLD 11/19/2022 9:30 A M Medical Record Number: [id number] Patient Account Number: [id number] Date of Birth/Sex: Treating RN: 13-Feb-1955 (67 y.o. 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 - Dr. Lady Gary 11/26/22 at 9:30am Return A Room 3 ppointment in 2 weeks. - Please ask front desk for appointments Return A Discharge From [facility] - Please keep wearing compression stockings. Anesthetic (In clinic) Topical Lidocaine 4% applied to wound bed Bathing/ Shower/ Hygiene May shower with protection but do not get wound dressing(s) wet. Protect dressing(s) with water repellant cover (for example, large plastic bag) or a cast cover and may then take shower. Wound Treatment Wound #10 - Abdomen - Lower Quadrant Wound Laterality: Left Cleanser: Soap and Water 1 x Per Day/30 Days Discharge Instructions: May shower and wash wound with dial antibacterial soap and water prior to dressing change. Cleanser: Vashe 5.8 (oz) 1 x Per Day/30 Days Discharge Instructions: Cleanse the wound with Vashe prior to applying a clean dressing using gauze sponges, not tissue or cotton balls. Cleanser: Wound Cleanser 1 x Per Day/30 Days Discharge Instructions: Cleanse the wound with wound cleanser prior to applying a clean dressing using gauze sponges, not tissue or cotton balls. Prim Dressing: Maxorb Extra Ag+ Alginate Dressing, 2x2 (in/in) 1 x Per Day/30 Days ary Discharge Instructions: Apply to wound bed as instructed Secondary Dressing: ABD Pad, 8x10 1 x Per Day/30 Days Discharge Instructions: Apply over primary dressing as directed. Secured With: 34M Medipore Scientist, research (life sciences) Surgical T 2x10 (in/yd) 1 x Per Day/30 Days ape Discharge Instructions: Secure with tape as directed. Wound #9 - Lower Leg Wound Laterality: Left, Anterior,  Distal Cleanser: Vashe 5.8 (oz) 1 x Per Week/30 Days Discharge Instructions: Cleanse the wound with Vashe prior to applying a clean dressing using gauze sponges, not tissue or cotton balls. Topical: Gentamicin 1 x Per Week/30 Days Discharge Instructions: As directed by physician Topical: Mupirocin Ointment 1 x Per Week/30 Days Discharge Instructions: Apply Mupirocin (Bactroban) as instructed Prim Dressing: Maxorb Extra Ag+ Alginate Dressing, 4x4.75 (in/in) 1 x Per Week/30 Days ary Discharge Instructions: Apply to wound bed as instructed Secondary Dressing: ABD Pad, 8x10 1 x Per Week/30 Days Discharge Instructions: Apply over primary dressing as directed. Compression Wrap: FourPress (4 layer compression wrap) 1 x Per Week/30 Days ZHOE, CATANIA ([id number]) [phone].pdf Page 7 of 14 Discharge Instructions: Apply four layer compression as directed. May also use Urgo K2 compression system as alternative. Compression Wrap: Unnaboot w/Calamine, 4x10 (in/yd) 1 x Per Week/30 Days Discharge Instructions: Apply Unnaboot as directed. Electronic Signature(s) Signed: 11/19/2022 10:35:48 AM By: [name] MD FACS Entered By: [name] on 11/19/2022 07:34:07 -------------------------------------------------------------------------------- Problem List Details Patient Name: Date of Service: [name] NITA  Instructions: Cleanse the wound with wound cleanser prior to applying a clean dressing using gauze sponges, not tissue or cotton balls. Prim Dressing: Maxorb Extra Ag+ Alginate Dressing, 2x2 (in/in) 1 x Per Day/30 Days ary Discharge Instructions: Apply to wound bed as instructed Secondary Dressing: ABD Pad, 8x10 1 x Per Day/30 Days Discharge Instructions: Apply over primary dressing as directed. Secured With: 30M Medipore Scientist, research (life sciences) Surgical T 2x10 (in/yd) 1 x Per Day/30 Days ape Discharge Instructions: Secure with tape as directed. WOUND #9: - Lower Leg Wound Laterality: Left, Anterior, Distal Cleanser: Vashe 5.8 (oz) 1 x Per Week/30 Days Discharge Instructions: Cleanse the wound with Vashe prior to applying a clean dressing using gauze sponges, not tissue or cotton balls. Topical: Gentamicin 1 x Per Week/30 Days Discharge Instructions: As directed by physician Topical: Mupirocin Ointment 1 x Per Week/30 Days Discharge Instructions: Apply Mupirocin (Bactroban) as instructed Prim Dressing: Maxorb Extra Ag+ Alginate Dressing, 4x4.75 (in/in) 1 x Per Week/30 Days ary Discharge Instructions: Apply to wound bed as instructed Secondary Dressing: ABD Pad, 8x10 1 x Per Week/30 Days Discharge Instructions: Apply over primary dressing as directed. Com pression Wrap: FourPress (4 layer compression wrap) 1 x Per Week/30 Days Discharge Instructions: Apply four layer compression as directed. May also use Urgo K2 compression system as alternative. Com pression Wrap: Unnaboot w/Calamine, 4x10 (in/yd) 1 x Per Week/30 Days Discharge Instructions: Apply Unnaboot as directed. 11/19/2022: The leg wound  is nearly healed. There is thin slough on the surface. The old stoma site remains stable. She sees Dr. Maisie Fus tomorrow. I used a curette to debride slough off of the leg wound. I did not address the abdominal wound today, as she will be seen by general surgery tomorrow. We will continue topical gentamicin and mupirocin with silver alginate and Urgo 4-layer compression equivalent wrap for the leg and silver alginate on the abdomen. Follow-up in 1 week. Electronic Signature(s) Signed: 11/19/2022 10:34:56 AM By: [name] MD FACS NIOMA, MCCUBBINS D ([id number]) AM By: [name] MD FACS [id number].pdf Page 13 of 14 Signed: 11/19/2022 10:34:56 Entered By: [name] on 11/19/2022 07:34:55 -------------------------------------------------------------------------------- HxROS Details Patient Name: Date of Service: MARIBELL, DEMEO D. 11/19/2022 9:30 A M Medical Record Number: [id number] Patient Account Number: [id number] Date of Birth/Sex: Treating RN: [birth year] (67 y.o. F) Primary Care Provider: [name] Other Clinician: Referring Provider: Treating Provider/Extender: Jasmine Pang in Treatment: 11 Information Obtained From Patient Constitutional Symptoms (General Health) Medical History: Past Medical History Notes: morbid obesity Eyes Medical History: Positive for: Cataracts Hematologic/Lymphatic Medical History: Positive for: Lymphedema Cardiovascular Medical History: Positive for: Hypertension; Peripheral Venous Disease Past Medical History Notes: schamberg disease, hyperlipidemia Gastrointestinal Medical History: Past Medical History Notes: diverticulitis , h/o obstruction due to diverticulitis , colostomy and colostomy reversal Endocrine Medical History: Past Medical History Notes: hypothyroidism Integumentary (Skin) Medical History: Negative for: History of Burn Musculoskeletal Medical  History: Positive for: Osteoarthritis HBO Extended History Items Eyes: Cataracts Immunizations Pneumococcal Vaccine: Received Pneumococcal Vaccination: Yes Received Pneumococcal Vaccination On or After [address] [place] ([id number]) [id number] Page 14 of 14 Implantable Devices None Hospitalization / Surgery History Type of Hospitalization/Surgery colostomy reversal 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 Family and Social History Cancer: No; Diabetes: No; Heart Disease: Yes - Mother,Father; Hereditary Spherocytosis: No; Hypertension: Yes - Mother,Father; Kidney Disease: Yes - Mother; Lung Disease: No; Seizures: No; Stroke: No; Thyroid Problems: Yes - Mother; Tuberculosis: No; Never smoker; Marital Status - Single; Alcohol Use: Never; Drug Use: No History;  room air. Notes 11/19/2022: The leg wound is nearly healed. There is thin slough on the surface. The old stoma site remains stable. Electronic Signature(s) Signed: 11/19/2022 10:33:52 AM By: [name] MD FACS Entered By: [name] on 11/19/2022 07:33:52 CHENITA, RUDA D ([id number]) [id number] Page 6 of 14 -------------------------------------------------------------------------------- Physician Orders Details Patient Name: Date of Service: GEENA, WEINHOLD 11/19/2022 9:30 A M Medical Record Number: [id number] Patient Account Number: [id number] Date of Birth/Sex: Treating RN: 13-Feb-1955 (67 y.o. 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 - Dr. Lady Gary 11/26/22 at 9:30am Return A Room 3 ppointment in 2 weeks. - Please ask front desk for appointments Return A Discharge From [facility] Discharge from [facility] - Please keep wearing compression stockings. Anesthetic (In clinic) Topical Lidocaine 4% applied to wound bed Bathing/ Shower/ Hygiene May shower with protection but do not get wound dressing(s) wet. Protect dressing(s) with water repellant cover (for example, large plastic bag) or a cast cover and may then take shower. Wound Treatment Wound #10 - Abdomen - Lower Quadrant Wound Laterality: Left Cleanser: Soap and Water 1 x Per Day/30 Days Discharge Instructions: May shower and wash wound with dial antibacterial soap and water prior to dressing change. Cleanser: Vashe 5.8 (oz) 1 x Per Day/30 Days Discharge Instructions: Cleanse the wound with Vashe prior to applying a clean dressing using gauze sponges, not tissue or cotton balls. Cleanser: Wound Cleanser 1 x Per Day/30 Days Discharge Instructions: Cleanse the wound with wound cleanser prior to applying a clean dressing using gauze sponges, not tissue or cotton balls. Prim Dressing: Maxorb Extra Ag+ Alginate Dressing, 2x2 (in/in) 1 x Per Day/30 Days ary Discharge Instructions: Apply to wound bed as instructed Secondary Dressing: ABD Pad, 8x10 1 x Per Day/30 Days Discharge Instructions: Apply over primary dressing as directed. Secured With: 34M Medipore Scientist, research (life sciences) Surgical T 2x10 (in/yd) 1 x Per Day/30 Days ape Discharge Instructions: Secure with tape as directed. Wound #9 - Lower Leg Wound Laterality: Left, Anterior,  Distal Cleanser: Vashe 5.8 (oz) 1 x Per Week/30 Days Discharge Instructions: Cleanse the wound with Vashe prior to applying a clean dressing using gauze sponges, not tissue or cotton balls. Topical: Gentamicin 1 x Per Week/30 Days Discharge Instructions: As directed by physician Topical: Mupirocin Ointment 1 x Per Week/30 Days Discharge Instructions: Apply Mupirocin (Bactroban) as instructed Prim Dressing: Maxorb Extra Ag+ Alginate Dressing, 4x4.75 (in/in) 1 x Per Week/30 Days ary Discharge Instructions: Apply to wound bed as instructed Secondary Dressing: ABD Pad, 8x10 1 x Per Week/30 Days Discharge Instructions: Apply over primary dressing as directed. Compression Wrap: FourPress (4 layer compression wrap) 1 x Per Week/30 Days ZHOE, CATANIA ([id number]) [phone].pdf Page 7 of 14 Discharge Instructions: Apply four layer compression as directed. May also use Urgo K2 compression system as alternative. Compression Wrap: Unnaboot w/Calamine, 4x10 (in/yd) 1 x Per Week/30 Days Discharge Instructions: Apply Unnaboot as directed. Electronic Signature(s) Signed: 11/19/2022 10:35:48 AM By: [name] MD FACS Entered By: [name] on 11/19/2022 07:34:07 -------------------------------------------------------------------------------- Problem List Details Patient Name: Date of Service: [name] NITA  D. 11/19/2022 9:30 A M Medical Record Number: [id number] Patient Account Number: [id number] Date of Birth/Sex: Treating RN: July 23, 1955 (67 y.o. 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 11/19/2022 9:30 A M Medical Record Number: [id number] Patient Account Number: [id number] Date of Birth/Sex: Treating RN: [birth year] (67 y.o. F) Primary Care Provider: [name] Other Clinician: Referring Provider: Treating Provider/Extender: Jasmine Pang in Treatment: [address], [place] D ([id number]) [id number] Page 8 of 14 Chief Complaint Information obtained from Patient 04/19/2021: The patient is here for ongoing follow-up regarding 2 left lower extremity wounds. 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 She tells [name] that she fell on the gravel driveway in July. She developed injuries on her distal lower leg which have not healed. She saw her primary physician on 11/15/2017 who noted her left shin injuries. Gave her antibiotics. At that point the wounds were almost circumferential however most were less than 1.5 cm. Weeping edema fluid was noted. She was referred here for evaluation. The patient has a history of chronic lower extremity edema. 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 She has had prior history with leg weeping fluid. She does not wear compression  stockings. She is not doing anything specific to these wound areas. 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 She also has lymphedema. Put her in 3 layer compression and silver alginate last week. She has 3 small wounds with depth just lateral to the tibia. More problematically than this she has numerous shallow areas some of which are almost canal like in shape with tightly adherent painful debris. It would be very difficult and time- consuming to go through this and attempt to individually debride all these areas.. I changed her to collagen today to see if that would help with any of the surface debris on some of these wounds. Otherwise we will not be able to put this in compression we have to have someone change the dressing. The patient is not eligible for home health 12/25/17 on evaluation today patient actually appears to be doing rather well in regard to the ulcer on her lower extremity. Fortunately there does not appear to be evidence of infection at this time. She has been tolerating the dressing changes without complication. This includes the compression wrap. The only issue she had was that the wrap was initially placed over her bunion  Erin, [name] ([id number]) [id number] Page 1 of 14 Visit Report for 11/19/2022 Chief Complaint Document Details Patient Name: Date of Service: [name], DRESDEN 11/19/2022 9:30 A M Medical Record Number: [id number] Patient Account Number: [id number] Date of Birth/Sex: Treating RN: 1955-11-26 (67 y.o. F) Primary Care Provider: [name] Other Clinician: Referring Provider: Treating Provider/Extender: Jasmine Pang in Treatment: 11 Information Obtained from: Patient Chief Complaint 04/19/2021: The patient is here for ongoing follow-up regarding 2 left lower extremity wounds. 01/22/2022; patient returns to clinic with a wound on the left anterior lower leg secondary to trauma Electronic Signature(s) Signed: 11/19/2022 10:31:42 AM By: [name] MD FACS Entered By: [name] on 11/19/2022 07:31:41 -------------------------------------------------------------------------------- Debridement Details Patient Name: Date of Service: [name] [name] 11/19/2022 9:30 A M Medical Record Number: [id number] Patient Account Number: [id number] Date of Birth/Sex: Treating RN: Jun 17, 1955 (67 y.o. [name] Primary Care Provider: [name] Other Clinician: Referring Provider: Treating Provider/Extender: Jasmine Pang in Treatment: 11 Debridement Performed for Assessment: Wound #9 Left,Distal,Anterior Lower Leg Performed By: Physician [name], MD The following information was scribed by: Karie Schwalbe The information was scribed for: [name] Debridement Type: Debridement Level of Consciousness (Pre-procedure): Awake and Alert Pre-procedure Verification/Time Out Yes - 10:17 Taken: Start Time: 10:17 Pain Control: Lidocaine 4% T opical Solution Percent of Wound Bed Debrided: 100% T Area Debrided (cm): otal 0.09 Tissue and other material debrided: Non-Viable, Slough,  Slough Level: Non-Viable Tissue Debridement Description: Selective/Open Wound Instrument: Curette Bleeding: Minimum Hemostasis Achieved: Pressure End Time: 10:19 Procedural Pain: 0 Post Procedural Pain: 0 Response to Treatment: Procedure was tolerated well Level of Consciousness (Post- Awake and Alert procedure): Post Debridement Measurements of Total Wound Length: (cm) 0.4 Width: (cm) 0.3 Depth: (cm) 0.1 Erin, [name] D ([id number]) [id number] Page 2 of 14 Volume: (cm) 0.009 Character of Wound/Ulcer Post Debridement: Improved Post Procedure Diagnosis Same as Pre-procedure Electronic Signature(s) Signed: 11/19/2022 10:35:48 AM By: [name] MD FACS Signed: 11/19/2022 5:38:09 PM By: Karie Schwalbe RN Entered By: Karie Schwalbe on 11/19/2022 07:20:31 -------------------------------------------------------------------------------- HPI Details Patient Name: Date of Service: [name] D. 11/19/2022 9:30 A M Medical Record Number: [id number] Patient Account Number: [id number] Date of Birth/Sex: Treating RN: 1955/06/18 (67 y.o. 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 She tells [name] that she fell on the gravel driveway in July. She developed injuries on her distal lower leg which have not healed. She saw her primary physician on 11/15/2017 who noted her left shin injuries. Gave her antibiotics. At that point the wounds were almost circumferential however most were less than 1.5 cm. Weeping edema fluid was noted. She was referred here for evaluation. The patient has a history of chronic lower extremity edema. 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 She has had prior history with leg weeping fluid. She does not wear compression stockings. She is not doing anything specific to these wound areas. 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 She has chronic venous inflammation/stasis dermatitis which is  Caffeine Use: Daily - coffee; Financial Concerns: No; Food, Clothing or Shelter Needs: No; Support System Lacking: No; Transportation Concerns: No Electronic Signature(s) Signed: 11/19/2022 10:35:48 AM By: [name] MD FACS Entered By: [name] on 11/19/2022 07:33:00 -------------------------------------------------------------------------------- SuperBill Details Patient Name: Date of Service: [name] D. 11/19/2022 Medical Record Number: [id number] Patient Account Number: [id number] Date of Birth/Sex: Treating RN: August 04, 1955 (67 y.o. 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 with other specif Modifier: ied severity Quantity: 1 Physician Procedures : CPT4 Code Description Modifier [id number] 99214 - WC PHYS LEVEL 4 - EST PT 25 ICD-10 Diagnosis Description L97.928 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 with other specified severity L98.499 Non-pressure chronic ulcer of skin of  other sites with unspecified severity 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 Prim Dressing: Maxorb Extra Ag+ Alginate Dressing, 2x2 (in/in) 1 x Per Day/30 Days ary Discharge Instructions: Apply to wound bed as instructed Secondary Dressing: ABD Pad, 8x10 1 x Per Day/30 Days Discharge Instructions: Apply over primary dressing as directed. Secured With: 30M Medipore Scientist, research (life sciences) Surgical T 2x10 (in/yd) 1 x Per Day/30 Days ape Discharge Instructions: Secure with tape as directed. WOUND #9: - Lower Leg Wound Laterality: Left, Anterior, Distal Cleanser: Vashe 5.8 (oz) 1 x Per Week/30 Days Discharge Instructions: Cleanse the wound with Vashe prior to applying a clean dressing using gauze sponges, not tissue or cotton balls. Topical: Gentamicin 1 x Per Week/30 Days Discharge Instructions: As directed by physician Topical: Mupirocin Ointment 1 x Per Week/30 Days Discharge Instructions: Apply Mupirocin (Bactroban) as instructed Prim Dressing: Maxorb Extra Ag+ Alginate Dressing, 4x4.75 (in/in) 1 x Per Week/30 Days ary Discharge Instructions: Apply to wound bed as instructed Secondary Dressing: ABD Pad, 8x10 1 x Per Week/30 Days Discharge Instructions: Apply over primary dressing as directed. Com pression Wrap: FourPress (4 layer compression wrap) 1 x Per Week/30 Days Discharge Instructions: Apply four layer compression as directed. May also use Urgo K2 compression system as alternative. Com pression Wrap: Unnaboot w/Calamine, 4x10 (in/yd) 1 x Per Week/30 Days Discharge Instructions: Apply Unnaboot as directed. 11/19/2022: The leg wound  is nearly healed. There is thin slough on the surface. The old stoma site remains stable. She sees Dr. Maisie Fus tomorrow. I used a curette to debride slough off of the leg wound. I did not address the abdominal wound today, as she will be seen by general surgery tomorrow. We will continue topical gentamicin and mupirocin with silver alginate and Urgo 4-layer compression equivalent wrap for the leg and silver alginate on the abdomen. Follow-up in 1 week. Electronic Signature(s) Signed: 11/19/2022 10:34:56 AM By: [name] MD FACS NIOMA, MCCUBBINS D ([id number]) AM By: [name] MD FACS [id number].pdf Page 13 of 14 Signed: 11/19/2022 10:34:56 Entered By: [name] on 11/19/2022 07:34:55 -------------------------------------------------------------------------------- HxROS Details Patient Name: Date of Service: MARIBELL, DEMEO D. 11/19/2022 9:30 A M Medical Record Number: [id number] Patient Account Number: [id number] Date of Birth/Sex: Treating RN: [birth year] (67 y.o. F) Primary Care Provider: [name] Other Clinician: Referring Provider: Treating Provider/Extender: Jasmine Pang in Treatment: 11 Information Obtained From Patient Constitutional Symptoms (General Health) Medical History: Past Medical History Notes: morbid obesity Eyes Medical History: Positive for: Cataracts Hematologic/Lymphatic Medical History: Positive for: Lymphedema Cardiovascular Medical History: Positive for: Hypertension; Peripheral Venous Disease Past Medical History Notes: schamberg disease, hyperlipidemia Gastrointestinal Medical History: Past Medical History Notes: diverticulitis , h/o obstruction due to diverticulitis , colostomy and colostomy reversal Endocrine Medical History: Past Medical History Notes: hypothyroidism Integumentary (Skin) Medical History: Negative for: History of Burn Musculoskeletal Medical  History: Positive for: Osteoarthritis HBO Extended History Items Eyes: Cataracts Immunizations Pneumococcal Vaccine: Received Pneumococcal Vaccination: Yes Received Pneumococcal Vaccination On or After [address] [place] ([id number]) [id number] Page 14 of 14 Implantable Devices None Hospitalization / Surgery History Type of Hospitalization/Surgery colostomy reversal 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 Family and Social History Cancer: No; Diabetes: No; Heart Disease: Yes - Mother,Father; Hereditary Spherocytosis: No; Hypertension: Yes - Mother,Father; Kidney Disease: Yes - Mother; Lung Disease: No; Seizures: No; Stroke: No; Thyroid Problems: Yes - Mother; Tuberculosis: No; Never smoker; Marital Status - Single; Alcohol Use: Never; Drug Use: No History;  Instructions: Cleanse the wound with wound cleanser prior to applying a clean dressing using gauze sponges, not tissue or cotton balls. Prim Dressing: Maxorb Extra Ag+ Alginate Dressing, 2x2 (in/in) 1 x Per Day/30 Days ary Discharge Instructions: Apply to wound bed as instructed Secondary Dressing: ABD Pad, 8x10 1 x Per Day/30 Days Discharge Instructions: Apply over primary dressing as directed. Secured With: 30M Medipore Scientist, research (life sciences) Surgical T 2x10 (in/yd) 1 x Per Day/30 Days ape Discharge Instructions: Secure with tape as directed. WOUND #9: - Lower Leg Wound Laterality: Left, Anterior, Distal Cleanser: Vashe 5.8 (oz) 1 x Per Week/30 Days Discharge Instructions: Cleanse the wound with Vashe prior to applying a clean dressing using gauze sponges, not tissue or cotton balls. Topical: Gentamicin 1 x Per Week/30 Days Discharge Instructions: As directed by physician Topical: Mupirocin Ointment 1 x Per Week/30 Days Discharge Instructions: Apply Mupirocin (Bactroban) as instructed Prim Dressing: Maxorb Extra Ag+ Alginate Dressing, 4x4.75 (in/in) 1 x Per Week/30 Days ary Discharge Instructions: Apply to wound bed as instructed Secondary Dressing: ABD Pad, 8x10 1 x Per Week/30 Days Discharge Instructions: Apply over primary dressing as directed. Com pression Wrap: FourPress (4 layer compression wrap) 1 x Per Week/30 Days Discharge Instructions: Apply four layer compression as directed. May also use Urgo K2 compression system as alternative. Com pression Wrap: Unnaboot w/Calamine, 4x10 (in/yd) 1 x Per Week/30 Days Discharge Instructions: Apply Unnaboot as directed. 11/19/2022: The leg wound  is nearly healed. There is thin slough on the surface. The old stoma site remains stable. She sees Dr. Maisie Fus tomorrow. I used a curette to debride slough off of the leg wound. I did not address the abdominal wound today, as she will be seen by general surgery tomorrow. We will continue topical gentamicin and mupirocin with silver alginate and Urgo 4-layer compression equivalent wrap for the leg and silver alginate on the abdomen. Follow-up in 1 week. Electronic Signature(s) Signed: 11/19/2022 10:34:56 AM By: [name] MD FACS NIOMA, MCCUBBINS D ([id number]) AM By: [name] MD FACS [id number].pdf Page 13 of 14 Signed: 11/19/2022 10:34:56 Entered By: [name] on 11/19/2022 07:34:55 -------------------------------------------------------------------------------- HxROS Details Patient Name: Date of Service: MARIBELL, DEMEO D. 11/19/2022 9:30 A M Medical Record Number: [id number] Patient Account Number: [id number] Date of Birth/Sex: Treating RN: [birth year] (67 y.o. F) Primary Care Provider: [name] Other Clinician: Referring Provider: Treating Provider/Extender: Jasmine Pang in Treatment: 11 Information Obtained From Patient Constitutional Symptoms (General Health) Medical History: Past Medical History Notes: morbid obesity Eyes Medical History: Positive for: Cataracts Hematologic/Lymphatic Medical History: Positive for: Lymphedema Cardiovascular Medical History: Positive for: Hypertension; Peripheral Venous Disease Past Medical History Notes: schamberg disease, hyperlipidemia Gastrointestinal Medical History: Past Medical History Notes: diverticulitis , h/o obstruction due to diverticulitis , colostomy and colostomy reversal Endocrine Medical History: Past Medical History Notes: hypothyroidism Integumentary (Skin) Medical History: Negative for: History of Burn Musculoskeletal Medical  History: Positive for: Osteoarthritis HBO Extended History Items Eyes: Cataracts Immunizations Pneumococcal Vaccine: Received Pneumococcal Vaccination: Yes Received Pneumococcal Vaccination On or After [address], [place] ([id number]) [id number] Page 14 of 14 Implantable Devices None Hospitalization / Surgery History Type of Hospitalization/Surgery colostomy reversal 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 Family and Social History Cancer: No; Diabetes: No; Heart Disease: Yes - Mother,Father; Hereditary Spherocytosis: No; Hypertension: Yes - Mother,Father; Kidney Disease: Yes - Mother; Lung Disease: No; Seizures: No; Stroke: No; Thyroid Problems: Yes - Mother; Tuberculosis: No; Never smoker; Marital Status - Single; Alcohol Use: Never; Drug Use: No History;  Instructions: Cleanse the wound with wound cleanser prior to applying a clean dressing using gauze sponges, not tissue or cotton balls. Prim Dressing: Maxorb Extra Ag+ Alginate Dressing, 2x2 (in/in) 1 x Per Day/30 Days ary Discharge Instructions: Apply to wound bed as instructed Secondary Dressing: ABD Pad, 8x10 1 x Per Day/30 Days Discharge Instructions: Apply over primary dressing as directed. Secured With: 30M Medipore Scientist, research (life sciences) Surgical T 2x10 (in/yd) 1 x Per Day/30 Days ape Discharge Instructions: Secure with tape as directed. WOUND #9: - Lower Leg Wound Laterality: Left, Anterior, Distal Cleanser: Vashe 5.8 (oz) 1 x Per Week/30 Days Discharge Instructions: Cleanse the wound with Vashe prior to applying a clean dressing using gauze sponges, not tissue or cotton balls. Topical: Gentamicin 1 x Per Week/30 Days Discharge Instructions: As directed by physician Topical: Mupirocin Ointment 1 x Per Week/30 Days Discharge Instructions: Apply Mupirocin (Bactroban) as instructed Prim Dressing: Maxorb Extra Ag+ Alginate Dressing, 4x4.75 (in/in) 1 x Per Week/30 Days ary Discharge Instructions: Apply to wound bed as instructed Secondary Dressing: ABD Pad, 8x10 1 x Per Week/30 Days Discharge Instructions: Apply over primary dressing as directed. Com pression Wrap: FourPress (4 layer compression wrap) 1 x Per Week/30 Days Discharge Instructions: Apply four layer compression as directed. May also use Urgo K2 compression system as alternative. Com pression Wrap: Unnaboot w/Calamine, 4x10 (in/yd) 1 x Per Week/30 Days Discharge Instructions: Apply Unnaboot as directed. 11/19/2022: The leg wound  is nearly healed. There is thin slough on the surface. The old stoma site remains stable. She sees Dr. Maisie Fus tomorrow. I used a curette to debride slough off of the leg wound. I did not address the abdominal wound today, as she will be seen by general surgery tomorrow. We will continue topical gentamicin and mupirocin with silver alginate and Urgo 4-layer compression equivalent wrap for the leg and silver alginate on the abdomen. Follow-up in 1 week. Electronic Signature(s) Signed: 11/19/2022 10:34:56 AM By: [name] MD FACS NIOMA, MCCUBBINS D ([id number]) AM By: [name] MD FACS [id number].pdf Page 13 of 14 Signed: 11/19/2022 10:34:56 Entered By: [name] on 11/19/2022 07:34:55 -------------------------------------------------------------------------------- HxROS Details Patient Name: Date of Service: MARIBELL, DEMEO D. 11/19/2022 9:30 A M Medical Record Number: [id number] Patient Account Number: [id number] Date of Birth/Sex: Treating RN: [birth year] (67 y.o. F) Primary Care Provider: [name] Other Clinician: Referring Provider: Treating Provider/Extender: Jasmine Pang in Treatment: 11 Information Obtained From Patient Constitutional Symptoms (General Health) Medical History: Past Medical History Notes: morbid obesity Eyes Medical History: Positive for: Cataracts Hematologic/Lymphatic Medical History: Positive for: Lymphedema Cardiovascular Medical History: Positive for: Hypertension; Peripheral Venous Disease Past Medical History Notes: schamberg disease, hyperlipidemia Gastrointestinal Medical History: Past Medical History Notes: diverticulitis , h/o obstruction due to diverticulitis , colostomy and colostomy reversal Endocrine Medical History: Past Medical History Notes: hypothyroidism Integumentary (Skin) Medical History: Negative for: History of Burn Musculoskeletal Medical  History: Positive for: Osteoarthritis HBO Extended History Items Eyes: Cataracts Immunizations Pneumococcal Vaccine: Received Pneumococcal Vaccination: Yes Received Pneumococcal Vaccination On or After [address], [place] ([id number]) [id number] Page 14 of 14 Implantable Devices None Hospitalization / Surgery History Type of Hospitalization/Surgery colostomy reversal 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 Family and Social History Cancer: No; Diabetes: No; Heart Disease: Yes - Mother,Father; Hereditary Spherocytosis: No; Hypertension: Yes - Mother,Father; Kidney Disease: Yes - Mother; Lung Disease: No; Seizures: No; Stroke: No; Thyroid Problems: Yes - Mother; Tuberculosis: No; Never smoker; Marital Status - Single; Alcohol Use: Never; Drug Use: No History;  D. 11/19/2022 9:30 A M Medical Record Number: [id number] Patient Account Number: [id number] Date of Birth/Sex: Treating RN: July 23, 1955 (67 y.o. 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 11/19/2022 9:30 A M Medical Record Number: [id number] Patient Account Number: [id number] Date of Birth/Sex: Treating RN: [birth year] (67 y.o. F) Primary Care Provider: [name] Other Clinician: Referring Provider: Treating Provider/Extender: Jasmine Pang in Treatment: [address], [place] D ([id number]) [id number] Page 8 of 14 Chief Complaint Information obtained from Patient 04/19/2021: The patient is here for ongoing follow-up regarding 2 left lower extremity wounds. 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 She tells [name] that she fell on the gravel driveway in July. She developed injuries on her distal lower leg which have not healed. She saw her primary physician on 11/15/2017 who noted her left shin injuries. Gave her antibiotics. At that point the wounds were almost circumferential however most were less than 1.5 cm. Weeping edema fluid was noted. She was referred here for evaluation. The patient has a history of chronic lower extremity edema. 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 She has had prior history with leg weeping fluid. She does not wear compression  stockings. She is not doing anything specific to these wound areas. 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 She also has lymphedema. Put her in 3 layer compression and silver alginate last week. She has 3 small wounds with depth just lateral to the tibia. More problematically than this she has numerous shallow areas some of which are almost canal like in shape with tightly adherent painful debris. It would be very difficult and time- consuming to go through this and attempt to individually debride all these areas.. I changed her to collagen today to see if that would help with any of the surface debris on some of these wounds. Otherwise we will not be able to put this in compression we have to have someone change the dressing. The patient is not eligible for home health 12/25/17 on evaluation today patient actually appears to be doing rather well in regard to the ulcer on her lower extremity. Fortunately there does not appear to be evidence of infection at this time. She has been tolerating the dressing changes without complication. This includes the compression wrap. The only issue she had was that the wrap was initially placed over her bunion  D. 11/19/2022 9:30 A M Medical Record Number: [id number] Patient Account Number: [id number] Date of Birth/Sex: Treating RN: July 23, 1955 (67 y.o. 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 11/19/2022 9:30 A M Medical Record Number: [id number] Patient Account Number: [id number] Date of Birth/Sex: Treating RN: [birth year] (67 y.o. F) Primary Care Provider: [name] Other Clinician: Referring Provider: Treating Provider/Extender: Jasmine Pang in Treatment: [address], [place] D ([id number]) [id number] Page 8 of 14 Chief Complaint Information obtained from Patient 04/19/2021: The patient is here for ongoing follow-up regarding 2 left lower extremity wounds. 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 She tells [name] that she fell on the gravel driveway in July. She developed injuries on her distal lower leg which have not healed. She saw her primary physician on 11/15/2017 who noted her left shin injuries. Gave her antibiotics. At that point the wounds were almost circumferential however most were less than 1.5 cm. Weeping edema fluid was noted. She was referred here for evaluation. The patient has a history of chronic lower extremity edema. 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 She has had prior history with leg weeping fluid. She does not wear compression  stockings. She is not doing anything specific to these wound areas. 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 She also has lymphedema. Put her in 3 layer compression and silver alginate last week. She has 3 small wounds with depth just lateral to the tibia. More problematically than this she has numerous shallow areas some of which are almost canal like in shape with tightly adherent painful debris. It would be very difficult and time- consuming to go through this and attempt to individually debride all these areas.. I changed her to collagen today to see if that would help with any of the surface debris on some of these wounds. Otherwise we will not be able to put this in compression we have to have someone change the dressing. The patient is not eligible for home health 12/25/17 on evaluation today patient actually appears to be doing rather well in regard to the ulcer on her lower extremity. Fortunately there does not appear to be evidence of infection at this time. She has been tolerating the dressing changes without complication. This includes the compression wrap. The only issue she had was that the wrap was initially placed over her bunion

## 2022-11-21 NOTE — Telephone Encounter (Signed)
Name: [name]  DOB: 07-Mar-1955  MRN: [id number]  Primary Cardiologist: Maisie Fus, MD   Preoperative team, please contact this patient and set up a phone call appointment for further preoperative risk assessment. Please obtain consent and complete medication review. Thank you for your help.  I confirm that guidance regarding antiplatelet and oral anticoagulation therapy has been completed and, if necessary, noted below (none requested).   I also confirmed the patient resides in the state of [place]. As per [facility] Medical Board telemedicine laws, the patient must reside in the state in which the provider is licensed.   Joylene Grapes, NP 11/21/2022, 12:22 PM [facility]

## 2022-11-22 NOTE — Telephone Encounter (Signed)
Lvm for pt to call office to schedule tele clearance.

## 2022-11-26 ENCOUNTER — Encounter (HOSPITAL_BASED_OUTPATIENT_CLINIC_OR_DEPARTMENT_OTHER): Payer: Commercial Managed Care - PPO | Admitting: General Surgery

## 2022-11-26 DIAGNOSIS — L98499 Non-pressure chronic ulcer of skin of other sites with unspecified severity: Secondary | ICD-10-CM | POA: Diagnosis not present

## 2022-11-26 DIAGNOSIS — I87312 Chronic venous hypertension (idiopathic) with ulcer of left lower extremity: Secondary | ICD-10-CM | POA: Diagnosis not present

## 2022-11-26 DIAGNOSIS — L817 Pigmented purpuric dermatosis: Secondary | ICD-10-CM | POA: Diagnosis not present

## 2022-11-26 DIAGNOSIS — I739 Peripheral vascular disease, unspecified: Secondary | ICD-10-CM | POA: Diagnosis not present

## 2022-11-26 DIAGNOSIS — Z6841 Body Mass Index (BMI) 40.0 and over, adult: Secondary | ICD-10-CM | POA: Diagnosis not present

## 2022-11-26 DIAGNOSIS — L97928 Non-pressure chronic ulcer of unspecified part of left lower leg with other specified severity: Secondary | ICD-10-CM | POA: Diagnosis not present

## 2022-11-26 NOTE — Progress Notes (Signed)
[name], [name] ([id number]) [id number] Page 1 of 14 Visit Report for 11/26/2022 Chief Complaint Document Details Patient Name: Date of Service: [name] 11/26/2022 9:30 A M Medical Record Number: [id number] Patient Account Number: [id number] Date of Birth/Sex: Treating [name]: 03-Sep-1955 (67 y.o. F) Primary Care Provider: Dorothyann [name] Other Clinician: Referring Provider: Treating Provider/Extender: [name] in Treatment: 12 Information Obtained from: Patient Chief Complaint 04/19/2021: The patient is here for ongoing follow-up regarding 2 left lower extremity wounds. 01/22/2022; patient returns to clinic with a wound on the left anterior lower leg secondary to trauma Electronic Signature(s) Signed: 11/26/2022 10:34:20 AM By: Erin Guess MD FACS Entered By: [name] on 11/26/2022 07:34:20 -------------------------------------------------------------------------------- Debridement Details Patient Name: Date of Service: Erin Rosier NITA [name] 11/26/2022 9:30 A M Medical Record Number: [id number] Patient Account Number: [id number] Date of Birth/Sex: Treating [name]: 29-Mar-1955 (67 y.o. [name] Primary Care Provider: Dorothyann [name] Other Clinician: Referring Provider: Treating Provider/Extender: [name] in Treatment: 12 Debridement Performed for Assessment: Wound #9 Left,Distal,Anterior Lower Leg Performed By: Physician Erin Guess, MD The following information was scribed by: [name] The information was scribed for: [name] Debridement Type: Debridement Level of Consciousness (Pre-procedure): Awake and Alert Pre-procedure Verification/Time Out Yes - 10:18 Taken: Start Time: 10:18 Pain Control: Lidocaine 4% Topical Solution Percent of Wound Bed Debrided: 100% T Area Debrided (cm): otal 0.03 Tissue and other material debrided: Non-Viable, Eschar,  Slough, Slough Level: Non-Viable Tissue Debridement Description: Selective/Open Wound Instrument: Curette Bleeding: Minimum Hemostasis Achieved: Pressure End Time: 10:22 Procedural Pain: 0 Post Procedural Pain: 0 Response to Treatment: Procedure was tolerated well Level of Consciousness (Post- Awake and Alert procedure): Post Debridement Measurements of Total Wound Length: (cm) 0.2 Width: (cm) 0.2 Depth: (cm) 0.1 [name] ([id number]) [id number] Page 2 of 14 Volume: (cm) 0.003 Character of Wound/Ulcer Post Debridement: Improved Post Procedure Diagnosis Same as Pre-procedure Electronic Signature(s) Signed: 11/26/2022 10:44:08 AM By: Erin Guess MD FACS Signed: 11/26/2022 4:30:49 PM By: Erin Schwalbe [name] Entered By: [name] on 11/26/2022 07:23:53 -------------------------------------------------------------------------------- HPI Details Patient Name: Date of Service: Erin Philips [name] 11/26/2022 9:30 A M Medical Record Number: [id number] Patient Account Number: [id number] Date of Birth/Sex: Treating [name]: May 26, 1955 (68 y.o. 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 She tells [name] that she fell on the gravel driveway in July. She developed injuries on her distal lower leg which have not healed. She saw her primary physician on 11/15/2017 who noted her left shin injuries. Gave her antibiotics. At that point the wounds were almost circumferential however most were less than 1.5 cm. Weeping edema fluid was noted. She was referred here for evaluation. The patient has a history of chronic lower extremity edema. 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 She has had prior history with leg weeping fluid. She does not wear compression stockings. She is not doing anything specific to these wound areas. 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 She has chronic venous inflammation/stasis dermatitis which is  [name]: Aug 10, 1955 (67 y.o. F) Primary Care Provider: Dorothyann [name] Other Clinician: Referring Provider: Treating Provider/Extender: [name] in Treatment: 12 Constitutional Slightly hypertensive. . . . no acute distress. Respiratory Normal work of breathing on room air. Notes 11/26/2022: The leg wound is down to just a very small pinhole opening under a layer of slough and eschar. The abdominal wound is stable. Electronic Signature(s) Signed: 11/26/2022 10:36:08 AM By: Erin Guess MD FACS Entered By: [name] on 11/26/2022 07:36:08 [name], HARTNEY [name] ([id number]) [id number] Page 6 of 14 -------------------------------------------------------------------------------- Physician Orders Details Patient Name: Date of Service: [name] 11/26/2022 9:30 A M Medical Record Number: [id number] Patient Account Number: [id number] Date of Birth/Sex:  Treating [name]: 1955/03/18 (67 y.o. [name] Primary Care Provider: Dorothyann [name] Other Clinician: Referring Provider: Treating Provider/Extender: [name] in Treatment: 12 Verbal / Phone Orders: No Diagnosis Coding Follow-up Appointments ppointment in 1 week. - [name] 12/03/22 at 9:30am Return A Room 3 ppointment in 2 weeks. - Please ask front desk for appointments Return A Discharge From [facility] Services Discharge from Wound Care Center - Please keep wearing compression stockings. Anesthetic (In clinic) Topical Lidocaine 4% applied to wound bed Bathing/ Shower/ Hygiene May shower with protection but do not get wound dressing(s) wet. Protect dressing(s) with water repellant cover (for example, large plastic bag) or a cast cover and may then take shower. Wound Treatment Wound #10 - Abdomen - Lower Quadrant Wound Laterality: Left Cleanser: Soap and Water 1 x Per Day/30 Days Discharge Instructions: May shower and wash wound with dial antibacterial soap and water prior to dressing change. Cleanser: Vashe 5.8 (oz) 1 x Per Day/30 Days Discharge Instructions: Cleanse the wound with Vashe prior to applying a clean dressing using gauze sponges, not tissue or cotton balls. Cleanser: Wound Cleanser 1 x Per Day/30 Days Discharge Instructions: Cleanse the wound with wound cleanser prior to applying a clean dressing using gauze sponges, not tissue or cotton balls. Prim Dressing: Maxorb Extra Ag+ Alginate Dressing, 2x2 (in/in) 1 x Per Day/30 Days ary Discharge Instructions: Apply to wound bed as instructed Secondary Dressing: ABD Pad, 8x10 1 x Per Day/30 Days Discharge Instructions: Apply over primary dressing as directed. Secured With: 58M Medipore Scientist, research (life sciences) Surgical T 2x10 (in/yd) 1 x Per Day/30 Days ape Discharge Instructions: Secure with tape as directed. Wound #9 - Lower Leg Wound Laterality: Left, Anterior, Distal Cleanser: Vashe 5.8 (oz) 1 x Per  Week/30 Days Discharge Instructions: Cleanse the wound with Vashe prior to applying a clean dressing using gauze sponges, not tissue or cotton balls. Topical: Gentamicin 1 x Per Week/30 Days Discharge Instructions: As directed by physician Topical: Mupirocin Ointment 1 x Per Week/30 Days Discharge Instructions: Apply Mupirocin (Bactroban) as instructed Prim Dressing: Maxorb Extra Ag+ Alginate Dressing, 4x4.75 (in/in) 1 x Per Week/30 Days ary Discharge Instructions: Apply to wound bed as instructed Secondary Dressing: ABD Pad, 8x10 1 x Per Week/30 Days Discharge Instructions: Apply over primary dressing as directed. Compression Wrap: FourPress (4 layer compression wrap) 1 x Per Week/30 Days Discharge Instructions: Apply four layer compression as directed. May also use Urgo K2 compression system as alternative. Compression Wrap: Unnaboot w/Calamine, 4x10 (in/yd) 1 x Per Week/30 Days Discharge Instructions: Apply Unnaboot as directed. NYANA, HAREN ([id number]) [id number] Page 7 of 14 Electronic Signature(s) Signed: 11/26/2022 10:44:08 AM By: Erin Guess MD FACS Entered By: [name] on 11/26/2022 07:36:29 -------------------------------------------------------------------------------- Problem List Details Patient Name: Date of Service: [name],  BO NITA [name] 11/26/2022 9:30 A M Medical Record Number: [id number] Patient Account Number: [id number] Date of Birth/Sex: Treating [name]: Dec 31, 1955 (67 y.o. 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 with other 08/31/2022 No Yes specified severity 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 08/31/2022 No  Yes Inactive Problems ICD-10 Code Description Active Date Inactive Date L98.499 Non-pressure chronic ulcer of skin of other sites with unspecified severity 10/08/2022 10/08/2022 Resolved Problems Electronic Signature(s) Signed: 11/26/2022 10:31:19 AM By: Erin Guess MD FACS Entered By: [name] on 11/26/2022 07:31:19 -------------------------------------------------------------------------------- Progress Note Details Patient Name: Date of Service: Erin Rosier NITA [name] 11/26/2022 9:30 A M Medical Record Number: [id number] Patient Account Number: [id number] Date of Birth/Sex: Treating [name]: 07-Feb-1956 (67 y.o. F) Primary Care Provider: Dorothyann [name] Other Clinician: Referring Provider: Treating Provider/Extender: [name] in Treatment: 12 Subjective Chief Complaint Information obtained from Patient 04/19/2021: The patient is here for ongoing follow-up regarding 2 left lower extremity wounds. 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 She tells [name] that she fell on the gravel driveway in July. She developed injuries on her distal lower leg which have not healed. She saw her primary physician on 11/15/2017 who noted her left shin injuries. Gave her antibiotics. At that point the wounds were almost circumferential however most were less than 1.5 cm. Weeping edema fluid was noted. She was referred here for evaluation. The patient has a history of chronic lower extremity edema. 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 She has had prior history with leg weeping fluid. She does  not wear compression stockings. She is not doing anything specific to these wound areas. 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 She also has lymphedema. Put her in 3 layer compression and silver alginate last week. She has 3 small wounds with depth just lateral to the tibia. More problematically than this she has numerous shallow areas some of which are almost canal like in shape with tightly adherent painful debris. It would be very difficult and time- consuming to go through this and attempt to individually debride all these areas.. I changed her to collagen today to see if that would help with any of the surface debris on some of these wounds. Otherwise we will not be able to put this in compression we have to have someone change the dressing. The patient is not eligible for home health 12/25/17 on evaluation today patient actually appears to be doing rather well in regard to the ulcer on her lower extremity. Fortunately there does not appear to be evidence of infection at this time. She has been tolerating the dressing changes without complication. This includes the compression wrap. The only issue she had was that  [name], [name] ([id number]) [id number] Page 1 of 14 Visit Report for 11/26/2022 Chief Complaint Document Details Patient Name: Date of Service: [name] 11/26/2022 9:30 A M Medical Record Number: [id number] Patient Account Number: [id number] Date of Birth/Sex: Treating [name]: 03-Sep-1955 (67 y.o. F) Primary Care Provider: Dorothyann [name] Other Clinician: Referring Provider: Treating Provider/Extender: [name] in Treatment: 12 Information Obtained from: Patient Chief Complaint 04/19/2021: The patient is here for ongoing follow-up regarding 2 left lower extremity wounds. 01/22/2022; patient returns to clinic with a wound on the left anterior lower leg secondary to trauma Electronic Signature(s) Signed: 11/26/2022 10:34:20 AM By: Erin Guess MD FACS Entered By: [name] on 11/26/2022 07:34:20 -------------------------------------------------------------------------------- Debridement Details Patient Name: Date of Service: Erin Rosier NITA [name] 11/26/2022 9:30 A M Medical Record Number: [id number] Patient Account Number: [id number] Date of Birth/Sex: Treating [name]: 29-Mar-1955 (67 y.o. [name] Primary Care Provider: Dorothyann [name] Other Clinician: Referring Provider: Treating Provider/Extender: [name] in Treatment: 12 Debridement Performed for Assessment: Wound #9 Left,Distal,Anterior Lower Leg Performed By: Physician Erin Guess, MD The following information was scribed by: [name] The information was scribed for: [name] Debridement Type: Debridement Level of Consciousness (Pre-procedure): Awake and Alert Pre-procedure Verification/Time Out Yes - 10:18 Taken: Start Time: 10:18 Pain Control: Lidocaine 4% Topical Solution Percent of Wound Bed Debrided: 100% T Area Debrided (cm): otal 0.03 Tissue and other material debrided: Non-Viable, Eschar,  Slough, Slough Level: Non-Viable Tissue Debridement Description: Selective/Open Wound Instrument: Curette Bleeding: Minimum Hemostasis Achieved: Pressure End Time: 10:22 Procedural Pain: 0 Post Procedural Pain: 0 Response to Treatment: Procedure was tolerated well Level of Consciousness (Post- Awake and Alert procedure): Post Debridement Measurements of Total Wound Length: (cm) 0.2 Width: (cm) 0.2 Depth: (cm) 0.1 [name] ([id number]) [id number] Page 2 of 14 Volume: (cm) 0.003 Character of Wound/Ulcer Post Debridement: Improved Post Procedure Diagnosis Same as Pre-procedure Electronic Signature(s) Signed: 11/26/2022 10:44:08 AM By: Erin Guess MD FACS Signed: 11/26/2022 4:30:49 PM By: Erin Schwalbe [name] Entered By: [name] on 11/26/2022 07:23:53 -------------------------------------------------------------------------------- HPI Details Patient Name: Date of Service: Erin Philips [name] 11/26/2022 9:30 A M Medical Record Number: [id number] Patient Account Number: [id number] Date of Birth/Sex: Treating [name]: May 26, 1955 (68 y.o. 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 She tells [name] that she fell on the gravel driveway in July. She developed injuries on her distal lower leg which have not healed. She saw her primary physician on 11/15/2017 who noted her left shin injuries. Gave her antibiotics. At that point the wounds were almost circumferential however most were less than 1.5 cm. Weeping edema fluid was noted. She was referred here for evaluation. The patient has a history of chronic lower extremity edema. 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 She has had prior history with leg weeping fluid. She does not wear compression stockings. She is not doing anything specific to these wound areas. 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 She has chronic venous inflammation/stasis dermatitis which is  [name], [name] ([id number]) [id number] Page 1 of 14 Visit Report for 11/26/2022 Chief Complaint Document Details Patient Name: Date of Service: [name] 11/26/2022 9:30 A M Medical Record Number: [id number] Patient Account Number: [id number] Date of Birth/Sex: Treating [name]: 03-Sep-1955 (67 y.o. F) Primary Care Provider: Dorothyann [name] Other Clinician: Referring Provider: Treating Provider/Extender: [name] in Treatment: 12 Information Obtained from: Patient Chief Complaint 04/19/2021: The patient is here for ongoing follow-up regarding 2 left lower extremity wounds. 01/22/2022; patient returns to clinic with a wound on the left anterior lower leg secondary to trauma Electronic Signature(s) Signed: 11/26/2022 10:34:20 AM By: Erin Guess MD FACS Entered By: [name] on 11/26/2022 07:34:20 -------------------------------------------------------------------------------- Debridement Details Patient Name: Date of Service: Erin Rosier NITA [name] 11/26/2022 9:30 A M Medical Record Number: [id number] Patient Account Number: [id number] Date of Birth/Sex: Treating [name]: 29-Mar-1955 (67 y.o. [name] Primary Care Provider: Dorothyann [name] Other Clinician: Referring Provider: Treating Provider/Extender: [name] in Treatment: 12 Debridement Performed for Assessment: Wound #9 Left,Distal,Anterior Lower Leg Performed By: Physician Erin Guess, MD The following information was scribed by: [name] The information was scribed for: [name] Debridement Type: Debridement Level of Consciousness (Pre-procedure): Awake and Alert Pre-procedure Verification/Time Out Yes - 10:18 Taken: Start Time: 10:18 Pain Control: Lidocaine 4% Topical Solution Percent of Wound Bed Debrided: 100% T Area Debrided (cm): otal 0.03 Tissue and other material debrided: Non-Viable, Eschar,  Slough, Slough Level: Non-Viable Tissue Debridement Description: Selective/Open Wound Instrument: Curette Bleeding: Minimum Hemostasis Achieved: Pressure End Time: 10:22 Procedural Pain: 0 Post Procedural Pain: 0 Response to Treatment: Procedure was tolerated well Level of Consciousness (Post- Awake and Alert procedure): Post Debridement Measurements of Total Wound Length: (cm) 0.2 Width: (cm) 0.2 Depth: (cm) 0.1 [name] ([id number]) [id number] Page 2 of 14 Volume: (cm) 0.003 Character of Wound/Ulcer Post Debridement: Improved Post Procedure Diagnosis Same as Pre-procedure Electronic Signature(s) Signed: 11/26/2022 10:44:08 AM By: Erin Guess MD FACS Signed: 11/26/2022 4:30:49 PM By: Erin Schwalbe [name] Entered By: [name] on 11/26/2022 07:23:53 -------------------------------------------------------------------------------- HPI Details Patient Name: Date of Service: Erin Philips [name] 11/26/2022 9:30 A M Medical Record Number: [id number] Patient Account Number: [id number] Date of Birth/Sex: Treating [name]: May 26, 1955 (68 y.o. 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 She tells [name] that she fell on the gravel driveway in July. She developed injuries on her distal lower leg which have not healed. She saw her primary physician on 11/15/2017 who noted her left shin injuries. Gave her antibiotics. At that point the wounds were almost circumferential however most were less than 1.5 cm. Weeping edema fluid was noted. She was referred here for evaluation. The patient has a history of chronic lower extremity edema. 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 She has had prior history with leg weeping fluid. She does not wear compression stockings. She is not doing anything specific to these wound areas. 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 She has chronic venous inflammation/stasis dermatitis which is  [name]: Aug 10, 1955 (67 y.o. F) Primary Care Provider: Dorothyann [name] Other Clinician: Referring Provider: Treating Provider/Extender: [name] in Treatment: 12 Constitutional Slightly hypertensive. . . . no acute distress. Respiratory Normal work of breathing on room air. Notes 11/26/2022: The leg wound is down to just a very small pinhole opening under a layer of slough and eschar. The abdominal wound is stable. Electronic Signature(s) Signed: 11/26/2022 10:36:08 AM By: Erin Guess MD FACS Entered By: [name] on 11/26/2022 07:36:08 [name], HARTNEY [name] ([id number]) [id number] Page 6 of 14 -------------------------------------------------------------------------------- Physician Orders Details Patient Name: Date of Service: [name] 11/26/2022 9:30 A M Medical Record Number: [id number] Patient Account Number: [id number] Date of Birth/Sex:  Treating [name]: 1955/03/18 (67 y.o. [name] Primary Care Provider: Dorothyann [name] Other Clinician: Referring Provider: Treating Provider/Extender: [name] in Treatment: 12 Verbal / Phone Orders: No Diagnosis Coding Follow-up Appointments ppointment in 1 week. - [name] 12/03/22 at 9:30am Return A Room 3 ppointment in 2 weeks. - Please ask front desk for appointments Return A Discharge From [facility] Services Discharge from Wound Care Center - Please keep wearing compression stockings. Anesthetic (In clinic) Topical Lidocaine 4% applied to wound bed Bathing/ Shower/ Hygiene May shower with protection but do not get wound dressing(s) wet. Protect dressing(s) with water repellant cover (for example, large plastic bag) or a cast cover and may then take shower. Wound Treatment Wound #10 - Abdomen - Lower Quadrant Wound Laterality: Left Cleanser: Soap and Water 1 x Per Day/30 Days Discharge Instructions: May shower and wash wound with dial antibacterial soap and water prior to dressing change. Cleanser: Vashe 5.8 (oz) 1 x Per Day/30 Days Discharge Instructions: Cleanse the wound with Vashe prior to applying a clean dressing using gauze sponges, not tissue or cotton balls. Cleanser: Wound Cleanser 1 x Per Day/30 Days Discharge Instructions: Cleanse the wound with wound cleanser prior to applying a clean dressing using gauze sponges, not tissue or cotton balls. Prim Dressing: Maxorb Extra Ag+ Alginate Dressing, 2x2 (in/in) 1 x Per Day/30 Days ary Discharge Instructions: Apply to wound bed as instructed Secondary Dressing: ABD Pad, 8x10 1 x Per Day/30 Days Discharge Instructions: Apply over primary dressing as directed. Secured With: 58M Medipore Scientist, research (life sciences) Surgical T 2x10 (in/yd) 1 x Per Day/30 Days ape Discharge Instructions: Secure with tape as directed. Wound #9 - Lower Leg Wound Laterality: Left, Anterior, Distal Cleanser: Vashe 5.8 (oz) 1 x Per  Week/30 Days Discharge Instructions: Cleanse the wound with Vashe prior to applying a clean dressing using gauze sponges, not tissue or cotton balls. Topical: Gentamicin 1 x Per Week/30 Days Discharge Instructions: As directed by physician Topical: Mupirocin Ointment 1 x Per Week/30 Days Discharge Instructions: Apply Mupirocin (Bactroban) as instructed Prim Dressing: Maxorb Extra Ag+ Alginate Dressing, 4x4.75 (in/in) 1 x Per Week/30 Days ary Discharge Instructions: Apply to wound bed as instructed Secondary Dressing: ABD Pad, 8x10 1 x Per Week/30 Days Discharge Instructions: Apply over primary dressing as directed. Compression Wrap: FourPress (4 layer compression wrap) 1 x Per Week/30 Days Discharge Instructions: Apply four layer compression as directed. May also use Urgo K2 compression system as alternative. Compression Wrap: Unnaboot w/Calamine, 4x10 (in/yd) 1 x Per Week/30 Days Discharge Instructions: Apply Unnaboot as directed. NYANA, HAREN ([id number]) [id number] Page 7 of 14 Electronic Signature(s) Signed: 11/26/2022 10:44:08 AM By: Erin Guess MD FACS Entered By: [name] on 11/26/2022 07:36:29 -------------------------------------------------------------------------------- Problem List Details Patient Name: Date of Service: [name],  Mother,Father; Kidney Disease: Yes - Mother; Lung Disease: No; Seizures: No; Stroke: No; Thyroid Problems: Yes - Mother; Tuberculosis: No; Never smoker; Marital Status - Single; Alcohol Use: Never; Drug Use: No History; Caffeine Use: Daily - coffee; Financial Concerns: No; Food, Clothing or Shelter Needs: No; Support System Lacking: No; Transportation Concerns: No Electronic Signature(s) Signed: 11/26/2022 10:44:08 AM By: Erin Guess MD FACS Entered By: [name] on 11/26/2022 07:35:14 -------------------------------------------------------------------------------- SuperBill Details Patient Name: Date of Service: Erin Rosier NITA [name] 11/26/2022 Medical Record Number: [id number] Patient Account Number: [id number] Date of Birth/Sex: Treating [name]: [birth year] (67 y.o. 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 with other specif Modifier: ied severity Quantity: 1 Physician Procedures : CPT4 Code Description Modifier [id number] 99214 - WC PHYS LEVEL 4 - EST PT 25 ICD-10 Diagnosis Description L97.928 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 I87.312 Chronic venous hypertension  (idiopathic) with ulcer of left lower extremity Quantity: 1 : [id number] 97597 - WC PHYS DEBR WO ANESTH 20 SQ CM ICD-10 Diagnosis Description L97.928 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 Quantity: 1 Electronic Signature(s) Signed: 11/26/2022 10:37:42 AM By: Erin Guess MD FACS Entered By: [name] on 11/26/2022 07:37:41  Mother,Father; Kidney Disease: Yes - Mother; Lung Disease: No; Seizures: No; Stroke: No; Thyroid Problems: Yes - Mother; Tuberculosis: No; Never smoker; Marital Status - Single; Alcohol Use: Never; Drug Use: No History; Caffeine Use: Daily - coffee; Financial Concerns: No; Food, Clothing or Shelter Needs: No; Support System Lacking: No; Transportation Concerns: No Electronic Signature(s) Signed: 11/26/2022 10:44:08 AM By: Erin Guess MD FACS Entered By: [name] on 11/26/2022 07:35:14 -------------------------------------------------------------------------------- SuperBill Details Patient Name: Date of Service: Erin Rosier NITA [name] 11/26/2022 Medical Record Number: [id number] Patient Account Number: [id number] Date of Birth/Sex: Treating [name]: [birth year] (67 y.o. 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 with other specif Modifier: ied severity Quantity: 1 Physician Procedures : CPT4 Code Description Modifier [id number] 99214 - WC PHYS LEVEL 4 - EST PT 25 ICD-10 Diagnosis Description L97.928 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 11/26/2022 9:30 A M Medical Record Number: [id number] Patient Account Number: [id number] Date of Birth/Sex: Treating [name]: Dec 31, 1955 (67 y.o. 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 with other 08/31/2022 No Yes specified severity 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 08/31/2022 No  Yes Inactive Problems ICD-10 Code Description Active Date Inactive Date L98.499 Non-pressure chronic ulcer of skin of other sites with unspecified severity 10/08/2022 10/08/2022 Resolved Problems Electronic Signature(s) Signed: 11/26/2022 10:31:19 AM By: Erin Guess MD FACS Entered By: [name] on 11/26/2022 07:31:19 -------------------------------------------------------------------------------- Progress Note Details Patient Name: Date of Service: Erin Rosier NITA [name] 11/26/2022 9:30 A M Medical Record Number: [id number] Patient Account Number: [id number] Date of Birth/Sex: Treating [name]: 07-Feb-1956 (67 y.o. F) Primary Care Provider: Dorothyann [name] Other Clinician: Referring Provider: Treating Provider/Extender: [name] in Treatment: 12 Subjective Chief Complaint Information obtained from Patient 04/19/2021: The patient is here for ongoing follow-up regarding 2 left lower extremity wounds. 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 She tells [name] that she fell on the gravel driveway in July. She developed injuries on her distal lower leg which have not healed. She saw her primary physician on 11/15/2017 who noted her left shin injuries. Gave her antibiotics. At that point the wounds were almost circumferential however most were less than 1.5 cm. Weeping edema fluid was noted. She was referred here for evaluation. The patient has a history of chronic lower extremity edema. 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 She has had prior history with leg weeping fluid. She does  not wear compression stockings. She is not doing anything specific to these wound areas. 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 She also has lymphedema. Put her in 3 layer compression and silver alginate last week. She has 3 small wounds with depth just lateral to the tibia. More problematically than this she has numerous shallow areas some of which are almost canal like in shape with tightly adherent painful debris. It would be very difficult and time- consuming to go through this and attempt to individually debride all these areas.. I changed her to collagen today to see if that would help with any of the surface debris on some of these wounds. Otherwise we will not be able to put this in compression we have to have someone change the dressing. The patient is not eligible for home health 12/25/17 on evaluation today patient actually appears to be doing rather well in regard to the ulcer on her lower extremity. Fortunately there does not appear to be evidence of infection at this time. She has been tolerating the dressing changes without complication. This includes the compression wrap. The only issue she had was that  BO NITA [name] 11/26/2022 9:30 A M Medical Record Number: [id number] Patient Account Number: [id number] Date of Birth/Sex: Treating [name]: Dec 31, 1955 (67 y.o. 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 with other 08/31/2022 No Yes specified severity 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 08/31/2022 No  Yes Inactive Problems ICD-10 Code Description Active Date Inactive Date L98.499 Non-pressure chronic ulcer of skin of other sites with unspecified severity 10/08/2022 10/08/2022 Resolved Problems Electronic Signature(s) Signed: 11/26/2022 10:31:19 AM By: Erin Guess MD FACS Entered By: [name] on 11/26/2022 07:31:19 -------------------------------------------------------------------------------- Progress Note Details Patient Name: Date of Service: Erin Rosier NITA [name] 11/26/2022 9:30 A M Medical Record Number: [id number] Patient Account Number: [id number] Date of Birth/Sex: Treating [name]: 07-Feb-1956 (67 y.o. F) Primary Care Provider: Dorothyann [name] Other Clinician: Referring Provider: Treating Provider/Extender: [name] in Treatment: 12 Subjective Chief Complaint Information obtained from Patient 04/19/2021: The patient is here for ongoing follow-up regarding 2 left lower extremity wounds. 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 She tells [name] that she fell on the gravel driveway in July. She developed injuries on her distal lower leg which have not healed. She saw her primary physician on 11/15/2017 who noted her left shin injuries. Gave her antibiotics. At that point the wounds were almost circumferential however most were less than 1.5 cm. Weeping edema fluid was noted. She was referred here for evaluation. The patient has a history of chronic lower extremity edema. 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 She has had prior history with leg weeping fluid. She does  not wear compression stockings. She is not doing anything specific to these wound areas. 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 She also has lymphedema. Put her in 3 layer compression and silver alginate last week. She has 3 small wounds with depth just lateral to the tibia. More problematically than this she has numerous shallow areas some of which are almost canal like in shape with tightly adherent painful debris. It would be very difficult and time- consuming to go through this and attempt to individually debride all these areas.. I changed her to collagen today to see if that would help with any of the surface debris on some of these wounds. Otherwise we will not be able to put this in compression we have to have someone change the dressing. The patient is not eligible for home health 12/25/17 on evaluation today patient actually appears to be doing rather well in regard to the ulcer on her lower extremity. Fortunately there does not appear to be evidence of infection at this time. She has been tolerating the dressing changes without complication. This includes the compression wrap. The only issue she had was that  and water prior to dressing change. Cleanser: Vashe 5.8 (oz) 1 x Per Day/30 Days Discharge Instructions: Cleanse the wound with Vashe prior to applying a clean dressing using gauze sponges, not tissue or cotton balls. Cleanser: Wound Cleanser 1 x Per Day/30 Days Discharge Instructions: Cleanse the wound with wound cleanser prior to applying a clean dressing using gauze sponges, not tissue or cotton balls. Prim Dressing: Maxorb Extra Ag+ Alginate Dressing, 2x2 (in/in) 1 x Per Day/30 Days ary Discharge Instructions: Apply to wound bed as instructed Secondary Dressing: ABD Pad, 8x10 1 x Per Day/30 Days Discharge Instructions: Apply over primary dressing as directed. Secured With: 63M Medipore Scientist, research (life sciences) Surgical T 2x10 (in/yd) 1 x Per Day/30 Days ape Discharge Instructions: Secure with tape as directed. WOUND #9: - Lower Leg Wound Laterality: Left, Anterior, Distal Cleanser: Vashe 5.8 (oz) 1 x Per Week/30 Days Discharge Instructions: Cleanse the wound with Vashe prior to applying a clean dressing using gauze sponges, not tissue or cotton balls. Topical: Gentamicin 1 x Per Week/30 Days Discharge Instructions: As directed by physician Topical: Mupirocin Ointment 1 x Per Week/30 Days Discharge Instructions: Apply Mupirocin (Bactroban) as instructed Prim Dressing: Maxorb Extra Ag+ Alginate Dressing, 4x4.75 (in/in) 1 x Per Week/30 Days ary Discharge Instructions: Apply to wound bed as instructed Secondary Dressing: ABD Pad, 8x10 1 x Per Week/30 Days Discharge Instructions: Apply over primary dressing as directed. Com pression Wrap: FourPress (4 layer compression wrap) 1 x Per Week/30 Days Discharge Instructions: Apply four layer compression as directed. May also use Urgo K2 compression system  as alternative. Com pression Wrap: Unnaboot w/Calamine, 4x10 (in/yd) 1 x Per Week/30 Days Discharge Instructions: Apply Unnaboot as directed. 11/26/2022: The leg wound is down to just a very small pinhole opening under a layer of slough and eschar. The abdominal wound is stable. She has surgery coming up next month with Dr. Maisie Fus to address this. I used a curette to debride slough and eschar from the leg wound. We will continue topical gentamicin and mupirocin with silver alginate and 4-layer compression. She will continue to cover the abdominal site with silver alginate and gauze. Follow-up in 1 week. Electronic Signature(s) Signed: 11/26/2022 10:37:23 AM By: Erin Guess MD FACS Entered By: [name] on 11/26/2022 07:37:23 CANDELA, KRUL [name] ([id number]) [id number] Page 13 of 14 -------------------------------------------------------------------------------- HxROS Details Patient Name: Date of Service: MYRELLA, FAHS 11/26/2022 9:30 A M Medical Record Number: [id number] Patient Account Number: [id number] Date of Birth/Sex: Treating [name]: 1955-07-13 (67 y.o. F) Primary Care Provider: Dorothyann [name] Other Clinician: Referring Provider: Treating Provider/Extender: [name] in Treatment: 12 Information Obtained From Patient Constitutional Symptoms (General Health) Medical History: Past Medical History Notes: morbid obesity Eyes Medical History: Positive for: Cataracts Hematologic/Lymphatic Medical History: Positive for: Lymphedema Cardiovascular Medical History: Positive for: Hypertension; Peripheral Venous Disease Past Medical History Notes: schamberg disease, hyperlipidemia Gastrointestinal Medical History: Past Medical History Notes: diverticulitis , h/o obstruction due to diverticulitis , colostomy and colostomy reversal Endocrine Medical History: Past Medical History  Notes: hypothyroidism Integumentary (Skin) Medical History: Negative for: History of Burn Musculoskeletal Medical History: Positive for: Osteoarthritis HBO Extended History Items Eyes: Cataracts Immunizations Pneumococcal Vaccine: Received Pneumococcal Vaccination: Yes Received Pneumococcal Vaccination On or After 60th Birthday: Yes Implantable Devices None ELETHA, CULBERTSON [name] ([id number]) [id number] Page 14 of 14 Hospitalization / Surgery History Type of Hospitalization/Surgery colostomy reversal 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 Family and Social History Cancer: No; Diabetes: No; Heart Disease: Yes - Mother,Father; Hereditary Spherocytosis: No; Hypertension: Yes -  [name]: Aug 10, 1955 (67 y.o. F) Primary Care Provider: Dorothyann [name] Other Clinician: Referring Provider: Treating Provider/Extender: [name] in Treatment: 12 Constitutional Slightly hypertensive. . . . no acute distress. Respiratory Normal work of breathing on room air. Notes 11/26/2022: The leg wound is down to just a very small pinhole opening under a layer of slough and eschar. The abdominal wound is stable. Electronic Signature(s) Signed: 11/26/2022 10:36:08 AM By: Erin Guess MD FACS Entered By: [name] on 11/26/2022 07:36:08 [name], HARTNEY [name] ([id number]) [id number] Page 6 of 14 -------------------------------------------------------------------------------- Physician Orders Details Patient Name: Date of Service: [name] 11/26/2022 9:30 A M Medical Record Number: [id number] Patient Account Number: [id number] Date of Birth/Sex:  Treating [name]: 1955/03/18 (67 y.o. [name] Primary Care Provider: Dorothyann [name] Other Clinician: Referring Provider: Treating Provider/Extender: [name] in Treatment: 12 Verbal / Phone Orders: No Diagnosis Coding Follow-up Appointments ppointment in 1 week. - [name] 12/03/22 at 9:30am Return A Room 3 ppointment in 2 weeks. - Please ask front desk for appointments Return A Discharge From [facility]charge from [facility] - Please keep wearing compression stockings. Anesthetic (In clinic) Topical Lidocaine 4% applied to wound bed Bathing/ Shower/ Hygiene May shower with protection but do not get wound dressing(s) wet. Protect dressing(s) with water repellant cover (for example, large plastic bag) or a cast cover and may then take shower. Wound Treatment Wound #10 - Abdomen - Lower Quadrant Wound Laterality: Left Cleanser: Soap and Water 1 x Per Day/30 Days Discharge Instructions: May shower and wash wound with dial antibacterial soap and water prior to dressing change. Cleanser: Vashe 5.8 (oz) 1 x Per Day/30 Days Discharge Instructions: Cleanse the wound with Vashe prior to applying a clean dressing using gauze sponges, not tissue or cotton balls. Cleanser: Wound Cleanser 1 x Per Day/30 Days Discharge Instructions: Cleanse the wound with wound cleanser prior to applying a clean dressing using gauze sponges, not tissue or cotton balls. Prim Dressing: Maxorb Extra Ag+ Alginate Dressing, 2x2 (in/in) 1 x Per Day/30 Days ary Discharge Instructions: Apply to wound bed as instructed Secondary Dressing: ABD Pad, 8x10 1 x Per Day/30 Days Discharge Instructions: Apply over primary dressing as directed. Secured With: 58M Medipore Scientist, research (life sciences) Surgical T 2x10 (in/yd) 1 x Per Day/30 Days ape Discharge Instructions: Secure with tape as directed. Wound #9 - Lower Leg Wound Laterality: Left, Anterior, Distal Cleanser: Vashe 5.8 (oz) 1 x Per  Week/30 Days Discharge Instructions: Cleanse the wound with Vashe prior to applying a clean dressing using gauze sponges, not tissue or cotton balls. Topical: Gentamicin 1 x Per Week/30 Days Discharge Instructions: As directed by physician Topical: Mupirocin Ointment 1 x Per Week/30 Days Discharge Instructions: Apply Mupirocin (Bactroban) as instructed Prim Dressing: Maxorb Extra Ag+ Alginate Dressing, 4x4.75 (in/in) 1 x Per Week/30 Days ary Discharge Instructions: Apply to wound bed as instructed Secondary Dressing: ABD Pad, 8x10 1 x Per Week/30 Days Discharge Instructions: Apply over primary dressing as directed. Compression Wrap: FourPress (4 layer compression wrap) 1 x Per Week/30 Days Discharge Instructions: Apply four layer compression as directed. May also use Urgo K2 compression system as alternative. Compression Wrap: Unnaboot w/Calamine, 4x10 (in/yd) 1 x Per Week/30 Days Discharge Instructions: Apply Unnaboot as directed. NYANA, HAREN ([id number]) [id number] Page 7 of 14 Electronic Signature(s) Signed: 11/26/2022 10:44:08 AM By: Erin Guess MD FACS Entered By: [name] on 11/26/2022 07:36:29 -------------------------------------------------------------------------------- Problem List Details Patient Name: Date of Service: [name],  Mother,Father; Kidney Disease: Yes - Mother; Lung Disease: No; Seizures: No; Stroke: No; Thyroid Problems: Yes - Mother; Tuberculosis: No; Never smoker; Marital Status - Single; Alcohol Use: Never; Drug Use: No History; Caffeine Use: Daily - coffee; Financial Concerns: No; Food, Clothing or Shelter Needs: No; Support System Lacking: No; Transportation Concerns: No Electronic Signature(s) Signed: 11/26/2022 10:44:08 AM By: Erin Guess MD FACS Entered By: [name] on 11/26/2022 07:35:14 -------------------------------------------------------------------------------- SuperBill Details Patient Name: Date of Service: Erin Rosier NITA [name] 11/26/2022 Medical Record Number: [id number] Patient Account Number: [id number] Date of Birth/Sex: Treating [name]: [birth year] (67 y.o. 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 with other specif Modifier: ied severity Quantity: 1 Physician Procedures : CPT4 Code Description Modifier [id number] 99214 - WC PHYS LEVEL 4 - EST PT 25 ICD-10 Diagnosis Description L97.928 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 I87.312 Chronic venous hypertension  (idiopathic) with ulcer of left lower extremity Quantity: 1 : [id number] 97597 - WC PHYS DEBR WO ANESTH 20 SQ CM ICD-10 Diagnosis Description L97.928 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 Quantity: 1 Electronic Signature(s) Signed: 11/26/2022 10:37:42 AM By: Erin Guess MD FACS Entered By: [name] on 11/26/2022 07:37:41  Mother,Father; Kidney Disease: Yes - Mother; Lung Disease: No; Seizures: No; Stroke: No; Thyroid Problems: Yes - Mother; Tuberculosis: No; Never smoker; Marital Status - Single; Alcohol Use: Never; Drug Use: No History; Caffeine Use: Daily - coffee; Financial Concerns: No; Food, Clothing or Shelter Needs: No; Support System Lacking: No; Transportation Concerns: No Electronic Signature(s) Signed: 11/26/2022 10:44:08 AM By: Erin Guess MD FACS Entered By: [name] on 11/26/2022 07:35:14 -------------------------------------------------------------------------------- SuperBill Details Patient Name: Date of Service: Erin Rosier NITA [name] 11/26/2022 Medical Record Number: [id number] Patient Account Number: [id number] Date of Birth/Sex: Treating [name]: [birth year] (67 y.o. 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 with other specif Modifier: ied severity Quantity: 1 Physician Procedures : CPT4 Code Description Modifier [id number] 99214 - WC PHYS LEVEL 4 - EST PT 25 ICD-10 Diagnosis Description L97.928 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 I87.312 Chronic venous hypertension  (idiopathic) with ulcer of left lower extremity Quantity: 1 : [id number] 97597 - WC PHYS DEBR WO ANESTH 20 SQ CM ICD-10 Diagnosis Description L97.928 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 F) Primary Care Provider: Dorothyann [name] Other Clinician: Referring Provider: Treating Provider/Extender: [name] in Treatment: 12 Information Obtained from: Patient Chief Complaint 04/19/2021: The patient is here for ongoing follow-up regarding 2 left lower extremity wounds. 01/22/2022; patient returns to clinic with a wound on the left anterior lower leg secondary to trauma Electronic Signature(s) Signed: 11/26/2022 10:34:20 AM By: Erin Guess MD FACS Entered By: [name] on 11/26/2022 07:34:20 -------------------------------------------------------------------------------- Debridement Details Patient Name: Date of Service: Erin Rosier NITA [name] 11/26/2022 9:30 A M Medical Record Number: [id number] Patient Account Number: [id number] Date of Birth/Sex: Treating [name]: 29-Mar-1955 (67 y.o. [name] Primary Care Provider: Dorothyann [name] Other Clinician: Referring Provider: Treating Provider/Extender: [name] in Treatment: 12 Debridement Performed for Assessment: Wound #9 Left,Distal,Anterior Lower Leg Performed By: Physician Erin Guess, MD The following information was scribed by: [name] The information was scribed for: [name] Debridement Type: Debridement Level of Consciousness (Pre-procedure): Awake and Alert Pre-procedure Verification/Time Out Yes - 10:18 Taken: Start Time: 10:18 Pain Control: Lidocaine 4% Topical Solution Percent of Wound Bed Debrided: 100% T Area Debrided (cm): otal 0.03 Tissue and other material debrided: Non-Viable, Eschar,  Slough, Slough Level: Non-Viable Tissue Debridement Description: Selective/Open Wound Instrument: Curette Bleeding: Minimum Hemostasis Achieved: Pressure End Time: 10:22 Procedural Pain: 0 Post Procedural Pain: 0 Response to Treatment: Procedure was tolerated well Level of Consciousness (Post- Awake and Alert procedure): Post Debridement Measurements of Total Wound Length: (cm) 0.2 Width: (cm) 0.2 Depth: (cm) 0.1 [name] ([id number]) [id number] Page 2 of 14 Volume: (cm) 0.003 Character of Wound/Ulcer Post Debridement: Improved Post Procedure Diagnosis Same as Pre-procedure Electronic Signature(s) Signed: 11/26/2022 10:44:08 AM By: Erin Guess MD FACS Signed: 11/26/2022 4:30:49 PM By: Erin Schwalbe [name] Entered By: [name] on 11/26/2022 07:23:53 -------------------------------------------------------------------------------- HPI Details Patient Name: Date of Service: Erin Philips [name] 11/26/2022 9:30 A M Medical Record Number: [id number] Patient Account Number: [id number] Date of Birth/Sex: Treating [name]: May 26, 1955 (68 y.o. 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 She tells [name] that she fell on the gravel driveway in July. She developed injuries on her distal lower leg which have not healed. She saw her primary physician on 11/15/2017 who noted her left shin injuries. Gave her antibiotics. At that point the wounds were almost circumferential however most were less than 1.5 cm. Weeping edema fluid was noted. She was referred here for evaluation. The patient has a history of chronic lower extremity edema. 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 She has had prior history with leg weeping fluid. She does not wear compression stockings. She is not doing anything specific to these wound areas. 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 She has chronic venous inflammation/stasis dermatitis which is  [name], [name] ([id number]) [id number] Page 1 of 14 Visit Report for 11/26/2022 Chief Complaint Document Details Patient Name: Date of Service: [name] 11/26/2022 9:30 A M Medical Record Number: [id number] Patient Account Number: [id number] Date of Birth/Sex: Treating [name]: 03-Sep-1955 (67 y.o. F) Primary Care Provider: Dorothyann [name] Other Clinician: Referring Provider: Treating Provider/Extender: [name] in Treatment: 12 Information Obtained from: Patient Chief Complaint 04/19/2021: The patient is here for ongoing follow-up regarding 2 left lower extremity wounds. 01/22/2022; patient returns to clinic with a wound on the left anterior lower leg secondary to trauma Electronic Signature(s) Signed: 11/26/2022 10:34:20 AM By: Erin Guess MD FACS Entered By: [name] on 11/26/2022 07:34:20 -------------------------------------------------------------------------------- Debridement Details Patient Name: Date of Service: Erin Rosier NITA [name] 11/26/2022 9:30 A M Medical Record Number: [id number] Patient Account Number: [id number] Date of Birth/Sex: Treating [name]: 29-Mar-1955 (67 y.o. [name] Primary Care Provider: Dorothyann [name] Other Clinician: Referring Provider: Treating Provider/Extender: [name] in Treatment: 12 Debridement Performed for Assessment: Wound #9 Left,Distal,Anterior Lower Leg Performed By: Physician Erin Guess, MD The following information was scribed by: [name] The information was scribed for: [name] Debridement Type: Debridement Level of Consciousness (Pre-procedure): Awake and Alert Pre-procedure Verification/Time Out Yes - 10:18 Taken: Start Time: 10:18 Pain Control: Lidocaine 4% Topical Solution Percent of Wound Bed Debrided: 100% T Area Debrided (cm): otal 0.03 Tissue and other material debrided: Non-Viable, Eschar,  Slough, Slough Level: Non-Viable Tissue Debridement Description: Selective/Open Wound Instrument: Curette Bleeding: Minimum Hemostasis Achieved: Pressure End Time: 10:22 Procedural Pain: 0 Post Procedural Pain: 0 Response to Treatment: Procedure was tolerated well Level of Consciousness (Post- Awake and Alert procedure): Post Debridement Measurements of Total Wound Length: (cm) 0.2 Width: (cm) 0.2 Depth: (cm) 0.1 [name] ([id number]) [id number] Page 2 of 14 Volume: (cm) 0.003 Character of Wound/Ulcer Post Debridement: Improved Post Procedure Diagnosis Same as Pre-procedure Electronic Signature(s) Signed: 11/26/2022 10:44:08 AM By: Erin Guess MD FACS Signed: 11/26/2022 4:30:49 PM By: Erin Schwalbe [name] Entered By: [name] on 11/26/2022 07:23:53 -------------------------------------------------------------------------------- HPI Details Patient Name: Date of Service: Erin Philips [name] 11/26/2022 9:30 A M Medical Record Number: [id number] Patient Account Number: [id number] Date of Birth/Sex: Treating [name]: May 26, 1955 (68 y.o. 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 She tells [name] that she fell on the gravel driveway in July. She developed injuries on her distal lower leg which have not healed. She saw her primary physician on 11/15/2017 who noted her left shin injuries. Gave her antibiotics. At that point the wounds were almost circumferential however most were less than 1.5 cm. Weeping edema fluid was noted. She was referred here for evaluation. The patient has a history of chronic lower extremity edema. 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 She has had prior history with leg weeping fluid. She does not wear compression stockings. She is not doing anything specific to these wound areas. 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 She has chronic venous inflammation/stasis dermatitis which is  [name], [name] ([id number]) [id number] Page 1 of 14 Visit Report for 11/26/2022 Chief Complaint Document Details Patient Name: Date of Service: [name] 11/26/2022 9:30 A M Medical Record Number: [id number] Patient Account Number: [id number] Date of Birth/Sex: Treating [name]: 03-Sep-1955 (67 y.o. F) Primary Care Provider: Dorothyann [name] Other Clinician: Referring Provider: Treating Provider/Extender: [name] in Treatment: 12 Information Obtained from: Patient Chief Complaint 04/19/2021: The patient is here for ongoing follow-up regarding 2 left lower extremity wounds. 01/22/2022; patient returns to clinic with a wound on the left anterior lower leg secondary to trauma Electronic Signature(s) Signed: 11/26/2022 10:34:20 AM By: Erin Guess MD FACS Entered By: [name] on 11/26/2022 07:34:20 -------------------------------------------------------------------------------- Debridement Details Patient Name: Date of Service: Erin Rosier NITA [name] 11/26/2022 9:30 A M Medical Record Number: [id number] Patient Account Number: [id number] Date of Birth/Sex: Treating [name]: 29-Mar-1955 (67 y.o. [name] Primary Care Provider: Dorothyann [name] Other Clinician: Referring Provider: Treating Provider/Extender: [name] in Treatment: 12 Debridement Performed for Assessment: Wound #9 Left,Distal,Anterior Lower Leg Performed By: Physician Erin Guess, MD The following information was scribed by: [name] The information was scribed for: [name] Debridement Type: Debridement Level of Consciousness (Pre-procedure): Awake and Alert Pre-procedure Verification/Time Out Yes - 10:18 Taken: Start Time: 10:18 Pain Control: Lidocaine 4% Topical Solution Percent of Wound Bed Debrided: 100% T Area Debrided (cm): otal 0.03 Tissue and other material debrided: Non-Viable, Eschar,  Slough, Slough Level: Non-Viable Tissue Debridement Description: Selective/Open Wound Instrument: Curette Bleeding: Minimum Hemostasis Achieved: Pressure End Time: 10:22 Procedural Pain: 0 Post Procedural Pain: 0 Response to Treatment: Procedure was tolerated well Level of Consciousness (Post- Awake and Alert procedure): Post Debridement Measurements of Total Wound Length: (cm) 0.2 Width: (cm) 0.2 Depth: (cm) 0.1 [name] ([id number]) [id number] Page 2 of 14 Volume: (cm) 0.003 Character of Wound/Ulcer Post Debridement: Improved Post Procedure Diagnosis Same as Pre-procedure Electronic Signature(s) Signed: 11/26/2022 10:44:08 AM By: Erin Guess MD FACS Signed: 11/26/2022 4:30:49 PM By: Erin Schwalbe [name] Entered By: [name] on 11/26/2022 07:23:53 -------------------------------------------------------------------------------- HPI Details Patient Name: Date of Service: Erin Philips [name] 11/26/2022 9:30 A M Medical Record Number: [id number] Patient Account Number: [id number] Date of Birth/Sex: Treating [name]: May 26, 1955 (68 y.o. 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 She tells [name] that she fell on the gravel driveway in July. She developed injuries on her distal lower leg which have not healed. She saw her primary physician on 11/15/2017 who noted her left shin injuries. Gave her antibiotics. At that point the wounds were almost circumferential however most were less than 1.5 cm. Weeping edema fluid was noted. She was referred here for evaluation. The patient has a history of chronic lower extremity edema. 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 She has had prior history with leg weeping fluid. She does not wear compression stockings. She is not doing anything specific to these wound areas. 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 She has chronic venous inflammation/stasis dermatitis which is  [name], [name] ([id number]) [id number] Page 1 of 14 Visit Report for 11/26/2022 Chief Complaint Document Details Patient Name: Date of Service: [name] 11/26/2022 9:30 A M Medical Record Number: [id number] Patient Account Number: [id number] Date of Birth/Sex: Treating [name]: 03-Sep-1955 (67 y.o. F) Primary Care Provider: Dorothyann [name] Other Clinician: Referring Provider: Treating Provider/Extender: [name] in Treatment: 12 Information Obtained from: Patient Chief Complaint 04/19/2021: The patient is here for ongoing follow-up regarding 2 left lower extremity wounds. 01/22/2022; patient returns to clinic with a wound on the left anterior lower leg secondary to trauma Electronic Signature(s) Signed: 11/26/2022 10:34:20 AM By: Erin Guess MD FACS Entered By: [name] on 11/26/2022 07:34:20 -------------------------------------------------------------------------------- Debridement Details Patient Name: Date of Service: Erin Rosier NITA [name] 11/26/2022 9:30 A M Medical Record Number: [id number] Patient Account Number: [id number] Date of Birth/Sex: Treating [name]: 29-Mar-1955 (67 y.o. [name] Primary Care Provider: Dorothyann [name] Other Clinician: Referring Provider: Treating Provider/Extender: [name] in Treatment: 12 Debridement Performed for Assessment: Wound #9 Left,Distal,Anterior Lower Leg Performed By: Physician Erin Guess, MD The following information was scribed by: [name] The information was scribed for: [name] Debridement Type: Debridement Level of Consciousness (Pre-procedure): Awake and Alert Pre-procedure Verification/Time Out Yes - 10:18 Taken: Start Time: 10:18 Pain Control: Lidocaine 4% Topical Solution Percent of Wound Bed Debrided: 100% T Area Debrided (cm): otal 0.03 Tissue and other material debrided: Non-Viable, Eschar,  Slough, Slough Level: Non-Viable Tissue Debridement Description: Selective/Open Wound Instrument: Curette Bleeding: Minimum Hemostasis Achieved: Pressure End Time: 10:22 Procedural Pain: 0 Post Procedural Pain: 0 Response to Treatment: Procedure was tolerated well Level of Consciousness (Post- Awake and Alert procedure): Post Debridement Measurements of Total Wound Length: (cm) 0.2 Width: (cm) 0.2 Depth: (cm) 0.1 [name] ([id number]) [id number] Page 2 of 14 Volume: (cm) 0.003 Character of Wound/Ulcer Post Debridement: Improved Post Procedure Diagnosis Same as Pre-procedure Electronic Signature(s) Signed: 11/26/2022 10:44:08 AM By: Erin Guess MD FACS Signed: 11/26/2022 4:30:49 PM By: Erin Schwalbe [name] Entered By: [name] on 11/26/2022 07:23:53 -------------------------------------------------------------------------------- HPI Details Patient Name: Date of Service: Erin Philips [name] 11/26/2022 9:30 A M Medical Record Number: [id number] Patient Account Number: [id number] Date of Birth/Sex: Treating [name]: May 26, 1955 (68 y.o. 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 She tells [name] that she fell on the gravel driveway in July. She developed injuries on her distal lower leg which have not healed. She saw her primary physician on 11/15/2017 who noted her left shin injuries. Gave her antibiotics. At that point the wounds were almost circumferential however most were less than 1.5 cm. Weeping edema fluid was noted. She was referred here for evaluation. The patient has a history of chronic lower extremity edema. 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 She has had prior history with leg weeping fluid. She does not wear compression stockings. She is not doing anything specific to these wound areas. 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 She has chronic venous inflammation/stasis dermatitis which is

## 2022-11-26 NOTE — Progress Notes (Signed)
[id number] Date of Birth/Sex: Treating RN: 06-12-1955 (67 y.o. F) Primary Care [name]: [name] Other Clinician: Referring [name]: Treating [name]/Extender: [name] in Treatment: 12 Vital Signs Height(in): 62 Pulse(bpm): 76 Weight(lbs): 221 Blood Pressure(mmHg): 148/84 Body Mass Index(BMI): 40.4 Temperature(F): 98.0 Respiratory Rate(breaths/min): 18 [10:Photos:] [N/A:N/A] Left Abdomen - Lower Quadrant Left, Distal, Anterior Lower Leg N/A Wound Location: Surgical Injury Gradually Appeared N/A Wounding Event: Abscess Vasculitis N/A Primary Etiology: Cataracts, Lymphedema, Cataracts, Lymphedema, N/A Comorbid History: Hypertension, Peripheral Venous Hypertension, Peripheral Venous Disease, Osteoarthritis Disease, Osteoarthritis 02/06/2017 07/14/2022 N/A Date Acquired: 7 12 N/A Weeks of Treatment: Open Open N/A Wound Status: No No N/A Wound Recurrence: 1.2x1x0.3 0.2x0.2x0.1 N/A Measurements L x W x [name] 0.942 0.031 N/A A (cm) : rea 0.283 0.003 N/A Volume (cm) : 63.10%  99.70% N/A % Reduction in A rea: -11.00% 99.70% N/A % Reduction in Volume: Full Thickness Without Exposed Full Thickness Without Exposed N/A Classification: Support Structures Support Structures Medium Medium N/A Exudate A mount: Serosanguineous Serosanguineous N/A Exudate Type: red, brown red, brown N/A Exudate Color: Distinct, outline attached Distinct, outline attached N/A Wound Margin: Large (67-100%) Large (67-100%) N/A Granulation A mount: Red, Hyper-granulation, Friable Pink N/A Granulation Quality: Small (1-33%) Small (1-33%) N/A Necrotic A mount: N/A Eschar, Adherent Slough N/A Necrotic Tissue: Fat Layer (Subcutaneous Tissue): Yes Fat Layer (Subcutaneous Tissue): Yes N/A Exposed Structures: Fascia: No Fascia: No Tendon: No Tendon: No Muscle: No Muscle: No Joint: No Joint: No Bone: No Bone: No Medium (34-66%) Medium (34-66%) N/A Epithelialization: N/A Debridement - Selective/Open Wound N/A Debridement: Pre-procedure Verification/Time Out N/A 10:18 N/A Taken: N/A Lidocaine 4% Topical Solution N/A Pain Control: N/A Necrotic/Eschar, Slough N/A Tissue Debrided: N/A Non-Viable Tissue N/A Level: N/A 0.03 N/A Debridement A (sq cm): rea N/A Curette N/A Instrument: N/A Minimum N/A Bleeding: N/A Pressure N/A Hemostasis A chieved: N/A 0 N/A Procedural Pain: N/A 0 N/A Post Procedural Pain: N/A Procedure was tolerated well N/A Debridement Treatment Response: N/A 0.2x0.2x0.1 N/A Post Debridement Measurements L x W x [name] [name], [name] ([id number]) [phone].pdf Page 4 of 9 N/A 0.003 N/A Post Debridement Volume: (cm) Scarring: Yes Scarring: Yes N/A Periwound Skin Texture: No Abnormalities Noted No Abnormalities Noted N/A Periwound Skin Moisture: No Abnormalities Noted Hemosiderin Staining: Yes N/A Periwound Skin Color: No Abnormality No Abnormality N/A Temperature: N/A Compression Therapy N/A Procedures  Performed: Debridement Treatment Notes Electronic Signature(s) Signed: 11/26/2022 10:32:22 AM By: Erin Guess MD FACS Entered By: [name] on 11/26/2022 07:32:22 -------------------------------------------------------------------------------- Multi-Disciplinary Care Plan Details Patient Name: Date of Service: [name] 11/26/2022 9:30 A M Medical Record Number: [id number] Patient Account Number: [id number] Date of Birth/Sex: Treating RN: April 11, 1955 (67 y.o. [name] Primary Care Ledonna Dormer: [name] Other Clinician: Referring Ivory Maduro: Treating Nia Nathaniel/Extender: [name] in Treatment: 12 Active Inactive Wound/Skin Impairment Nursing Diagnoses: Impaired tissue integrity Goals: Patient/caregiver will verbalize understanding of skin care regimen Date Initiated: 09/03/2022 Target Resolution Date: 02/11/2023 Goal Status: Active Interventions: Assess patient/caregiver ability to obtain necessary supplies Assess patient/caregiver ability to perform ulcer/skin care regimen upon admission and as needed Assess ulceration(s) every visit Provide education on ulcer and skin care Screen for HBO Treatment Activities: Skin care regimen initiated : 08/31/2022 Topical wound management initiated : 08/31/2022 Notes: Electronic Signature(s) Signed: 11/26/2022 4:30:49 PM By: Karie Schwalbe RN Entered By: Karie Schwalbe on 11/26/2022 13:07:24 -------------------------------------------------------------------------------- Pain Assessment Details Patient Name: Date of Service: [name] 11/26/2022 9:30 A M Medical Record Number: [id number]  [id number] Date of Birth/Sex: Treating RN: 06-12-1955 (67 y.o. F) Primary Care [name]: [name] Other Clinician: Referring [name]: Treating [name]/Extender: [name] in Treatment: 12 Vital Signs Height(in): 62 Pulse(bpm): 76 Weight(lbs): 221 Blood Pressure(mmHg): 148/84 Body Mass Index(BMI): 40.4 Temperature(F): 98.0 Respiratory Rate(breaths/min): 18 [10:Photos:] [N/A:N/A] Left Abdomen - Lower Quadrant Left, Distal, Anterior Lower Leg N/A Wound Location: Surgical Injury Gradually Appeared N/A Wounding Event: Abscess Vasculitis N/A Primary Etiology: Cataracts, Lymphedema, Cataracts, Lymphedema, N/A Comorbid History: Hypertension, Peripheral Venous Hypertension, Peripheral Venous Disease, Osteoarthritis Disease, Osteoarthritis 02/06/2017 07/14/2022 N/A Date Acquired: 7 12 N/A Weeks of Treatment: Open Open N/A Wound Status: No No N/A Wound Recurrence: 1.2x1x0.3 0.2x0.2x0.1 N/A Measurements L x W x [name] 0.942 0.031 N/A A (cm) : rea 0.283 0.003 N/A Volume (cm) : 63.10%  99.70% N/A % Reduction in A rea: -11.00% 99.70% N/A % Reduction in Volume: Full Thickness Without Exposed Full Thickness Without Exposed N/A Classification: Support Structures Support Structures Medium Medium N/A Exudate A mount: Serosanguineous Serosanguineous N/A Exudate Type: red, brown red, brown N/A Exudate Color: Distinct, outline attached Distinct, outline attached N/A Wound Margin: Large (67-100%) Large (67-100%) N/A Granulation A mount: Red, Hyper-granulation, Friable Pink N/A Granulation Quality: Small (1-33%) Small (1-33%) N/A Necrotic A mount: N/A Eschar, Adherent Slough N/A Necrotic Tissue: Fat Layer (Subcutaneous Tissue): Yes Fat Layer (Subcutaneous Tissue): Yes N/A Exposed Structures: Fascia: No Fascia: No Tendon: No Tendon: No Muscle: No Muscle: No Joint: No Joint: No Bone: No Bone: No Medium (34-66%) Medium (34-66%) N/A Epithelialization: N/A Debridement - Selective/Open Wound N/A Debridement: Pre-procedure Verification/Time Out N/A 10:18 N/A Taken: N/A Lidocaine 4% Topical Solution N/A Pain Control: N/A Necrotic/Eschar, Slough N/A Tissue Debrided: N/A Non-Viable Tissue N/A Level: N/A 0.03 N/A Debridement A (sq cm): rea N/A Curette N/A Instrument: N/A Minimum N/A Bleeding: N/A Pressure N/A Hemostasis A chieved: N/A 0 N/A Procedural Pain: N/A 0 N/A Post Procedural Pain: N/A Procedure was tolerated well N/A Debridement Treatment Response: N/A 0.2x0.2x0.1 N/A Post Debridement Measurements L x W x [name] [name], [name] ([id number]) [phone].pdf Page 4 of 9 N/A 0.003 N/A Post Debridement Volume: (cm) Scarring: Yes Scarring: Yes N/A Periwound Skin Texture: No Abnormalities Noted No Abnormalities Noted N/A Periwound Skin Moisture: No Abnormalities Noted Hemosiderin Staining: Yes N/A Periwound Skin Color: No Abnormality No Abnormality N/A Temperature: N/A Compression Therapy N/A Procedures  Performed: Debridement Treatment Notes Electronic Signature(s) Signed: 11/26/2022 10:32:22 AM By: Erin Guess MD FACS Entered By: [name] on 11/26/2022 07:32:22 -------------------------------------------------------------------------------- Multi-Disciplinary Care Plan Details Patient Name: Date of Service: [name] 11/26/2022 9:30 A M Medical Record Number: [id number] Patient Account Number: [id number] Date of Birth/Sex: Treating RN: April 11, 1955 (67 y.o. [name] Primary Care Ledonna Dormer: [name] Other Clinician: Referring Ivory Maduro: Treating Nia Nathaniel/Extender: [name] in Treatment: 12 Active Inactive Wound/Skin Impairment Nursing Diagnoses: Impaired tissue integrity Goals: Patient/caregiver will verbalize understanding of skin care regimen Date Initiated: 09/03/2022 Target Resolution Date: 02/11/2023 Goal Status: Active Interventions: Assess patient/caregiver ability to obtain necessary supplies Assess patient/caregiver ability to perform ulcer/skin care regimen upon admission and as needed Assess ulceration(s) every visit Provide education on ulcer and skin care Screen for HBO Treatment Activities: Skin care regimen initiated : 08/31/2022 Topical wound management initiated : 08/31/2022 Notes: Electronic Signature(s) Signed: 11/26/2022 4:30:49 PM By: Karie Schwalbe RN Entered By: Karie Schwalbe on 11/26/2022 13:07:24 -------------------------------------------------------------------------------- Pain Assessment Details Patient Name: Date of Service: [name] 11/26/2022 9:30 A M Medical Record Number: [id number]  [id number] Date of Birth/Sex: Treating RN: 1955/04/09 (67 y.o. [name] Primary Care Virgin Zellers: [name] Other Clinician: Referring Ellen Goris: Treating Satya Bohall/Extender: [name] in Treatment: 12 Wound Status Wound Number: 9 Primary Vasculitis Etiology: Wound Location: Left, Distal, Anterior Lower Leg Wound Open Wounding Event: Gradually Appeared Status: Date Acquired: 07/14/2022 Comorbid Cataracts, Lymphedema, Hypertension, Peripheral Venous Weeks Of Treatment: 12 History: Disease, Osteoarthritis Clustered Wound: No Photos Wound Measurements Length: (cm) 0.2 Width: (cm) 0.2 Depth: (cm) 0.1 Sahr, Shadana [name] ([id number]) Area: (cm) 0.031 Volume: (cm) 0.003 % Reduction in Area: 99.7% % Reduction in Volume: 99.7% Epithelialization: Medium (34-66%) [phone].pdf Page 8 of 9 Tunneling: No Undermining: No Wound Description Classification: Full Thickness Without Exposed Support Structures Wound Margin: Distinct, outline attached Exudate Amount: Medium Exudate Type: Serosanguineous Exudate Color: red, brown Foul Odor After Cleansing: No Slough/Fibrino Yes Wound Bed Granulation Amount: Large (67-100%) Exposed Structure Granulation Quality: Pink Fascia Exposed: No Necrotic Amount: Small (1-33%) Fat Layer (Subcutaneous Tissue) Exposed: Yes Necrotic  Quality: Eschar, Adherent Slough Tendon Exposed: No Muscle Exposed: No Joint Exposed: No Bone Exposed: No Periwound Skin Texture Texture Color No Abnormalities Noted: No No Abnormalities Noted: No Scarring: Yes Hemosiderin Staining: Yes Moisture Temperature / Pain No Abnormalities Noted: Yes Temperature: No Abnormality Treatment Notes Wound #9 (Lower Leg) Wound Laterality: Left, Anterior, Distal Cleanser Vashe 5.8 (oz) Discharge Instruction: Cleanse the wound with Vashe prior to applying a clean dressing using gauze sponges, not tissue or cotton balls. Peri-Wound Care Topical Gentamicin Discharge Instruction: As directed by physician Mupirocin Ointment Discharge Instruction: Apply Mupirocin (Bactroban) as instructed Primary Dressing Maxorb Extra Ag+ Alginate Dressing, 4x4.75 (in/in) Discharge Instruction: Apply to wound bed as instructed Secondary Dressing ABD Pad, 8x10 Discharge Instruction: Apply over primary dressing as directed. Secured With Compression Wrap FourPress (4 layer compression wrap) Discharge Instruction: Apply four layer compression as directed. May also use Urgo K2 compression system as alternative. Unnaboot w/Calamine, 4x10 (in/yd) Discharge Instruction: Apply Unnaboot as directed. Compression Stockings Add-Ons Electronic Signature(s) Signed: 11/26/2022 4:30:49 PM By: Karie Schwalbe RN Entered By: Karie Schwalbe on 11/26/2022 07:36:48 [name], [name] ([id number]) [id number].pdf Page 9 of 9 -------------------------------------------------------------------------------- Vitals Details Patient Name: Date of Service: [name], [name] 11/26/2022 9:30 A M Medical Record Number: [id number] Patient Account Number: [id number] Date of Birth/Sex: Treating RN: 09-02-1955 (67 y.o. F) Primary Care Iviona Hole: [name] Other Clinician: Referring Arianna Haydon: Treating Otis Burress/Extender: [name] in  Treatment: 12 Vital Signs Time Taken: 09:54 Temperature (F): 98.0 Height (in): 62 Pulse (bpm): 76 Weight (lbs): 221 Respiratory Rate (breaths/min): 18 Body Mass Index (BMI): 40.4 Blood Pressure (mmHg): 148/84 Reference Range: 80 - 120 mg / dl Electronic Signature(s) Signed: 11/26/2022 12:48:40 PM By: Dayton Scrape Entered By: Dayton Scrape on 11/26/2022 06:55:08  [id number] Date of Birth/Sex: Treating RN: 1955/04/09 (67 y.o. [name] Primary Care Virgin Zellers: [name] Other Clinician: Referring Ellen Goris: Treating Satya Bohall/Extender: [name] in Treatment: 12 Wound Status Wound Number: 9 Primary Vasculitis Etiology: Wound Location: Left, Distal, Anterior Lower Leg Wound Open Wounding Event: Gradually Appeared Status: Date Acquired: 07/14/2022 Comorbid Cataracts, Lymphedema, Hypertension, Peripheral Venous Weeks Of Treatment: 12 History: Disease, Osteoarthritis Clustered Wound: No Photos Wound Measurements Length: (cm) 0.2 Width: (cm) 0.2 Depth: (cm) 0.1 Sahr, Shadana [name] ([id number]) Area: (cm) 0.031 Volume: (cm) 0.003 % Reduction in Area: 99.7% % Reduction in Volume: 99.7% Epithelialization: Medium (34-66%) [phone].pdf Page 8 of 9 Tunneling: No Undermining: No Wound Description Classification: Full Thickness Without Exposed Support Structures Wound Margin: Distinct, outline attached Exudate Amount: Medium Exudate Type: Serosanguineous Exudate Color: red, brown Foul Odor After Cleansing: No Slough/Fibrino Yes Wound Bed Granulation Amount: Large (67-100%) Exposed Structure Granulation Quality: Pink Fascia Exposed: No Necrotic Amount: Small (1-33%) Fat Layer (Subcutaneous Tissue) Exposed: Yes Necrotic  Quality: Eschar, Adherent Slough Tendon Exposed: No Muscle Exposed: No Joint Exposed: No Bone Exposed: No Periwound Skin Texture Texture Color No Abnormalities Noted: No No Abnormalities Noted: No Scarring: Yes Hemosiderin Staining: Yes Moisture Temperature / Pain No Abnormalities Noted: Yes Temperature: No Abnormality Treatment Notes Wound #9 (Lower Leg) Wound Laterality: Left, Anterior, Distal Cleanser Vashe 5.8 (oz) Discharge Instruction: Cleanse the wound with Vashe prior to applying a clean dressing using gauze sponges, not tissue or cotton balls. Peri-Wound Care Topical Gentamicin Discharge Instruction: As directed by physician Mupirocin Ointment Discharge Instruction: Apply Mupirocin (Bactroban) as instructed Primary Dressing Maxorb Extra Ag+ Alginate Dressing, 4x4.75 (in/in) Discharge Instruction: Apply to wound bed as instructed Secondary Dressing ABD Pad, 8x10 Discharge Instruction: Apply over primary dressing as directed. Secured With Compression Wrap FourPress (4 layer compression wrap) Discharge Instruction: Apply four layer compression as directed. May also use Urgo K2 compression system as alternative. Unnaboot w/Calamine, 4x10 (in/yd) Discharge Instruction: Apply Unnaboot as directed. Compression Stockings Add-Ons Electronic Signature(s) Signed: 11/26/2022 4:30:49 PM By: Karie Schwalbe RN Entered By: Karie Schwalbe on 11/26/2022 07:36:48 [name], [name] ([id number]) [id number].pdf Page 9 of 9 -------------------------------------------------------------------------------- Vitals Details Patient Name: Date of Service: [name], [name] 11/26/2022 9:30 A M Medical Record Number: [id number] Patient Account Number: [id number] Date of Birth/Sex: Treating RN: 09-02-1955 (67 y.o. F) Primary Care Iviona Hole: [name] Other Clinician: Referring Arianna Haydon: Treating Otis Burress/Extender: [name] in  Treatment: 12 Vital Signs Time Taken: 09:54 Temperature (F): 98.0 Height (in): 62 Pulse (bpm): 76 Weight (lbs): 221 Respiratory Rate (breaths/min): 18 Body Mass Index (BMI): 40.4 Blood Pressure (mmHg): 148/84 Reference Range: 80 - 120 mg / dl Electronic Signature(s) Signed: 11/26/2022 12:48:40 PM By: Dayton Scrape Entered By: Dayton Scrape on 11/26/2022 06:55:08

## 2022-11-28 NOTE — Telephone Encounter (Signed)
2ND attempt to reach the pt for a tele pre op appt.

## 2022-12-03 ENCOUNTER — Telehealth: Payer: Self-pay | Admitting: *Deleted

## 2022-12-03 ENCOUNTER — Encounter (HOSPITAL_BASED_OUTPATIENT_CLINIC_OR_DEPARTMENT_OTHER): Payer: Commercial Managed Care - PPO | Admitting: General Surgery

## 2022-12-03 DIAGNOSIS — L958 Other vasculitis limited to the skin: Secondary | ICD-10-CM | POA: Diagnosis not present

## 2022-12-03 DIAGNOSIS — L97928 Non-pressure chronic ulcer of unspecified part of left lower leg with other specified severity: Secondary | ICD-10-CM | POA: Diagnosis not present

## 2022-12-03 DIAGNOSIS — I739 Peripheral vascular disease, unspecified: Secondary | ICD-10-CM | POA: Diagnosis not present

## 2022-12-03 DIAGNOSIS — L98499 Non-pressure chronic ulcer of skin of other sites with unspecified severity: Secondary | ICD-10-CM | POA: Diagnosis not present

## 2022-12-03 DIAGNOSIS — L817 Pigmented purpuric dermatosis: Secondary | ICD-10-CM | POA: Diagnosis not present

## 2022-12-03 DIAGNOSIS — I87312 Chronic venous hypertension (idiopathic) with ulcer of left lower extremity: Secondary | ICD-10-CM | POA: Diagnosis not present

## 2022-12-03 DIAGNOSIS — Z6841 Body Mass Index (BMI) 40.0 and over, adult: Secondary | ICD-10-CM | POA: Diagnosis not present

## 2022-12-03 NOTE — Telephone Encounter (Signed)
S/w the pt and she has been scheduled for tele pre op appt 12/12/22 @ 2:20. Med rec and consent are done.

## 2022-12-03 NOTE — Progress Notes (Signed)
[name] 12/03/2022 9:30 A M Medical Record Number: [id number] Patient Account Number: [id number] Date of Birth/Sex: Treating [name]: 1955-09-30 (67 y.o. F) Primary Care Avaleigh [name]: [name] Other Clinician: Referring [name]: Treating [name] in Treatment: 13 Vital Signs Height(in): 62 Pulse(bpm): 82 Weight(lbs): 221 Blood Pressure(mmHg): 129/65 Body Mass Index(BMI): 40.4 Temperature(F): 97.4 Respiratory Rate(breaths/min): 18 [10:Photos:] [N/A:N/A] Left Abdomen - Lower Quadrant Left, Distal, Anterior Lower Leg N/A Wound Location: Surgical Injury Gradually Appeared N/A Wounding Event: Abscess Lymphedema N/A Primary Etiology: Cataracts, Lymphedema, Cataracts, Lymphedema, N/A Comorbid History: Hypertension, Peripheral Venous Hypertension, Peripheral Venous Disease, Osteoarthritis Disease, Osteoarthritis 02/06/2017 07/14/2022 N/A Date Acquired: 8 13 N/A Weeks of Treatment: Open Open N/A Wound Status: No No N/A Wound Recurrence: 1.1x0.7x0.3 0.2x0.2x0.1 N/A Measurements  L x W x [name] 0.605 0.031 N/A A (cm) : rea 0.181 0.003 N/A Volume (cm) : 76.30% 99.70% N/A % Reduction in A rea: 29.00% 99.70% N/A % Reduction in Volume: Full Thickness Without Exposed Full Thickness Without Exposed N/A Classification: Support Structures Support Structures Medium Medium N/A Exudate A mount: Serosanguineous Serosanguineous N/A Exudate Type: red, brown red, brown N/A Exudate Color: Distinct, outline attached Distinct, outline attached N/A Wound Margin: Large (67-100%) Medium (34-66%) N/A Granulation A mount: Red, Hyper-granulation, Friable Pink N/A Granulation Quality: Small (1-33%) Medium (34-66%) N/A Necrotic A mount: Adherent Slough Eschar, Adherent Slough N/A Necrotic Tissue: Fat Layer (Subcutaneous Tissue): Yes Fat Layer (Subcutaneous Tissue): Yes N/A Exposed Structures: Fascia: No Fascia: No Tendon: No Tendon: No Muscle: No Muscle: No Joint: No Joint: No Bone: No Bone: No Medium (34-66%) Medium (34-66%) N/A Epithelialization: N/A Debridement - Selective/Open Wound N/A Debridement: Pre-procedure Verification/Time Out N/A 10:11 N/A Taken: N/A Lidocaine 5% topical ointment N/A Pain Control: N/A Necrotic/Eschar, Slough N/A Tissue Debrided: N/A Non-Viable Tissue N/A Level: N/A 0.03 N/A Debridement A (sq cm): rea N/A Curette N/A Instrument: N/A Minimum N/A Bleeding: N/A Pressure N/A Hemostasis A chieved: N/A 0 N/A Procedural PainKENDL, ZAVERI ([id number]) [id number] Page 4 of 9 N/A 0 N/A Post Procedural Pain: N/A Procedure was tolerated well N/A Debridement Treatment Response: N/A 0.2x0.2x0.1 N/A Post Debridement Measurements L x W x [name] N/A 0.003 N/A Post Debridement Volume: (cm) Scarring: Yes Scarring: Yes N/A Periwound Skin Texture: No Abnormalities Noted No Abnormalities Noted N/A Periwound Skin Moisture: No Abnormalities Noted Hemosiderin Staining: Yes N/A Periwound Skin Color: No  Abnormality No Abnormality N/A Temperature: N/A Compression Therapy N/A Procedures Performed: Debridement Treatment Notes Electronic Signature(s) Signed: 12/03/2022 10:24:19 AM By: [name] Entered By: [name] on 12/03/2022 07:24:19 -------------------------------------------------------------------------------- Multi-Disciplinary Care Plan Details Patient Name: Date of Service: [name] 12/03/2022 9:30 A M Medical Record Number: [id number] Patient Account Number: [id number] Date of Birth/Sex: Treating [name]: 10-28-1955 (67 y.o. [name] Primary Care [name]: [name] Other Clinician: Referring [name]: Treating [name]/Extender: [name] in Treatment: 13 Active Inactive Wound/Skin Impairment Nursing Diagnoses: Impaired tissue integrity Goals: Patient/caregiver will verbalize understanding of skin care regimen Date Initiated: 09/03/2022 Target Resolution Date: 02/11/2023 Goal Status: Active Interventions: Assess patient/caregiver ability to obtain necessary supplies Assess patient/caregiver ability to perform ulcer/skin care regimen upon admission and as needed Assess ulceration(s) every visit Provide education on ulcer and skin care Screen for HBO Treatment Activities: Skin care regimen initiated : 08/31/2022 Topical wound management initiated : 08/31/2022 Notes: Electronic Signature(s) Signed: 12/03/2022 5:15:15 PM By: [name] Entered By: Erin Schwalbe on 12/03/2022 14:07:34 [name] ([id number]) [id number] Page 5 of  [name] 12/03/2022 9:30 A M Medical Record Number: [id number] Patient Account Number: [id number] Date of Birth/Sex: Treating [name]: 1955-09-30 (67 y.o. F) Primary Care Avaleigh [name]: [name] Other Clinician: Referring [name]: Treating [name] in Treatment: 13 Vital Signs Height(in): 62 Pulse(bpm): 82 Weight(lbs): 221 Blood Pressure(mmHg): 129/65 Body Mass Index(BMI): 40.4 Temperature(F): 97.4 Respiratory Rate(breaths/min): 18 [10:Photos:] [N/A:N/A] Left Abdomen - Lower Quadrant Left, Distal, Anterior Lower Leg N/A Wound Location: Surgical Injury Gradually Appeared N/A Wounding Event: Abscess Lymphedema N/A Primary Etiology: Cataracts, Lymphedema, Cataracts, Lymphedema, N/A Comorbid History: Hypertension, Peripheral Venous Hypertension, Peripheral Venous Disease, Osteoarthritis Disease, Osteoarthritis 02/06/2017 07/14/2022 N/A Date Acquired: 8 13 N/A Weeks of Treatment: Open Open N/A Wound Status: No No N/A Wound Recurrence: 1.1x0.7x0.3 0.2x0.2x0.1 N/A Measurements  L x W x [name] 0.605 0.031 N/A A (cm) : rea 0.181 0.003 N/A Volume (cm) : 76.30% 99.70% N/A % Reduction in A rea: 29.00% 99.70% N/A % Reduction in Volume: Full Thickness Without Exposed Full Thickness Without Exposed N/A Classification: Support Structures Support Structures Medium Medium N/A Exudate A mount: Serosanguineous Serosanguineous N/A Exudate Type: red, brown red, brown N/A Exudate Color: Distinct, outline attached Distinct, outline attached N/A Wound Margin: Large (67-100%) Medium (34-66%) N/A Granulation A mount: Red, Hyper-granulation, Friable Pink N/A Granulation Quality: Small (1-33%) Medium (34-66%) N/A Necrotic A mount: Adherent Slough Eschar, Adherent Slough N/A Necrotic Tissue: Fat Layer (Subcutaneous Tissue): Yes Fat Layer (Subcutaneous Tissue): Yes N/A Exposed Structures: Fascia: No Fascia: No Tendon: No Tendon: No Muscle: No Muscle: No Joint: No Joint: No Bone: No Bone: No Medium (34-66%) Medium (34-66%) N/A Epithelialization: N/A Debridement - Selective/Open Wound N/A Debridement: Pre-procedure Verification/Time Out N/A 10:11 N/A Taken: N/A Lidocaine 5% topical ointment N/A Pain Control: N/A Necrotic/Eschar, Slough N/A Tissue Debrided: N/A Non-Viable Tissue N/A Level: N/A 0.03 N/A Debridement A (sq cm): rea N/A Curette N/A Instrument: N/A Minimum N/A Bleeding: N/A Pressure N/A Hemostasis A chieved: N/A 0 N/A Procedural PainKENDL, ZAVERI ([id number]) [id number] Page 4 of 9 N/A 0 N/A Post Procedural Pain: N/A Procedure was tolerated well N/A Debridement Treatment Response: N/A 0.2x0.2x0.1 N/A Post Debridement Measurements L x W x [name] N/A 0.003 N/A Post Debridement Volume: (cm) Scarring: Yes Scarring: Yes N/A Periwound Skin Texture: No Abnormalities Noted No Abnormalities Noted N/A Periwound Skin Moisture: No Abnormalities Noted Hemosiderin Staining: Yes N/A Periwound Skin Color: No  Abnormality No Abnormality N/A Temperature: N/A Compression Therapy N/A Procedures Performed: Debridement Treatment Notes Electronic Signature(s) Signed: 12/03/2022 10:24:19 AM By: [name] Entered By: [name] on 12/03/2022 07:24:19 -------------------------------------------------------------------------------- Multi-Disciplinary Care Plan Details Patient Name: Date of Service: [name] 12/03/2022 9:30 A M Medical Record Number: [id number] Patient Account Number: [id number] Date of Birth/Sex: Treating [name]: 10-28-1955 (67 y.o. [name] Primary Care [name]: [name] Other Clinician: Referring [name]: Treating [name]/Extender: [name] in Treatment: 13 Active Inactive Wound/Skin Impairment Nursing Diagnoses: Impaired tissue integrity Goals: Patient/caregiver will verbalize understanding of skin care regimen Date Initiated: 09/03/2022 Target Resolution Date: 02/11/2023 Goal Status: Active Interventions: Assess patient/caregiver ability to obtain necessary supplies Assess patient/caregiver ability to perform ulcer/skin care regimen upon admission and as needed Assess ulceration(s) every visit Provide education on ulcer and skin care Screen for HBO Treatment Activities: Skin care regimen initiated : 08/31/2022 Topical wound management initiated : 08/31/2022 Notes: Electronic Signature(s) Signed: 12/03/2022 5:15:15 PM By: [name] Entered By: Erin Schwalbe on 12/03/2022 14:07:34 [name] ([id number]) [id number] Page 5 of  [name] 12/03/2022 9:30 A M Medical Record Number: [id number] Patient Account Number: [id number] Date of Birth/Sex: Treating [name]: 1955-09-30 (67 y.o. F) Primary Care Avaleigh [name]: [name] Other Clinician: Referring [name]: Treating [name] in Treatment: 13 Vital Signs Height(in): 62 Pulse(bpm): 82 Weight(lbs): 221 Blood Pressure(mmHg): 129/65 Body Mass Index(BMI): 40.4 Temperature(F): 97.4 Respiratory Rate(breaths/min): 18 [10:Photos:] [N/A:N/A] Left Abdomen - Lower Quadrant Left, Distal, Anterior Lower Leg N/A Wound Location: Surgical Injury Gradually Appeared N/A Wounding Event: Abscess Lymphedema N/A Primary Etiology: Cataracts, Lymphedema, Cataracts, Lymphedema, N/A Comorbid History: Hypertension, Peripheral Venous Hypertension, Peripheral Venous Disease, Osteoarthritis Disease, Osteoarthritis 02/06/2017 07/14/2022 N/A Date Acquired: 8 13 N/A Weeks of Treatment: Open Open N/A Wound Status: No No N/A Wound Recurrence: 1.1x0.7x0.3 0.2x0.2x0.1 N/A Measurements  L x W x [name] 0.605 0.031 N/A A (cm) : rea 0.181 0.003 N/A Volume (cm) : 76.30% 99.70% N/A % Reduction in A rea: 29.00% 99.70% N/A % Reduction in Volume: Full Thickness Without Exposed Full Thickness Without Exposed N/A Classification: Support Structures Support Structures Medium Medium N/A Exudate A mount: Serosanguineous Serosanguineous N/A Exudate Type: red, brown red, brown N/A Exudate Color: Distinct, outline attached Distinct, outline attached N/A Wound Margin: Large (67-100%) Medium (34-66%) N/A Granulation A mount: Red, Hyper-granulation, Friable Pink N/A Granulation Quality: Small (1-33%) Medium (34-66%) N/A Necrotic A mount: Adherent Slough Eschar, Adherent Slough N/A Necrotic Tissue: Fat Layer (Subcutaneous Tissue): Yes Fat Layer (Subcutaneous Tissue): Yes N/A Exposed Structures: Fascia: No Fascia: No Tendon: No Tendon: No Muscle: No Muscle: No Joint: No Joint: No Bone: No Bone: No Medium (34-66%) Medium (34-66%) N/A Epithelialization: N/A Debridement - Selective/Open Wound N/A Debridement: Pre-procedure Verification/Time Out N/A 10:11 N/A Taken: N/A Lidocaine 5% topical ointment N/A Pain Control: N/A Necrotic/Eschar, Slough N/A Tissue Debrided: N/A Non-Viable Tissue N/A Level: N/A 0.03 N/A Debridement A (sq cm): rea N/A Curette N/A Instrument: N/A Minimum N/A Bleeding: N/A Pressure N/A Hemostasis A chieved: N/A 0 N/A Procedural PainKENDL, ZAVERI ([id number]) [id number] Page 4 of 9 N/A 0 N/A Post Procedural Pain: N/A Procedure was tolerated well N/A Debridement Treatment Response: N/A 0.2x0.2x0.1 N/A Post Debridement Measurements L x W x [name] N/A 0.003 N/A Post Debridement Volume: (cm) Scarring: Yes Scarring: Yes N/A Periwound Skin Texture: No Abnormalities Noted No Abnormalities Noted N/A Periwound Skin Moisture: No Abnormalities Noted Hemosiderin Staining: Yes N/A Periwound Skin Color: No  Abnormality No Abnormality N/A Temperature: N/A Compression Therapy N/A Procedures Performed: Debridement Treatment Notes Electronic Signature(s) Signed: 12/03/2022 10:24:19 AM By: [name] Entered By: [name] on 12/03/2022 07:24:19 -------------------------------------------------------------------------------- Multi-Disciplinary Care Plan Details Patient Name: Date of Service: [name] 12/03/2022 9:30 A M Medical Record Number: [id number] Patient Account Number: [id number] Date of Birth/Sex: Treating [name]: 10-28-1955 (67 y.o. [name] Primary Care [name]: [name] Other Clinician: Referring [name]: Treating [name]/Extender: [name] in Treatment: 13 Active Inactive Wound/Skin Impairment Nursing Diagnoses: Impaired tissue integrity Goals: Patient/caregiver will verbalize understanding of skin care regimen Date Initiated: 09/03/2022 Target Resolution Date: 02/11/2023 Goal Status: Active Interventions: Assess patient/caregiver ability to obtain necessary supplies Assess patient/caregiver ability to perform ulcer/skin care regimen upon admission and as needed Assess ulceration(s) every visit Provide education on ulcer and skin care Screen for HBO Treatment Activities: Skin care regimen initiated : 08/31/2022 Topical wound management initiated : 08/31/2022 Notes: Electronic Signature(s) Signed: 12/03/2022 5:15:15 PM By: [name] Entered By: Erin Schwalbe on 12/03/2022 14:07:34 [name] ([id number]) [id number] Page 5 of  9 -------------------------------------------------------------------------------- Pain Assessment Details Patient Name: Date of Service: [name], [name] 12/03/2022 9:30 A M Medical Record Number: [id number] Patient Account Number: [id number] Date of Birth/Sex: Treating [name]: 07/14/1955 (67 y.o. F) Primary Care Sariya Trickey: [name] Other Clinician: Referring Mihran Lebarron: Treating Rosendo Couser/Extender: [name] in Treatment: 13 Active Problems Location of Pain Severity and Description of Pain Patient Has Paino No Site Locations Pain Management and Medication Current Pain Management: Electronic Signature(s) Signed: 12/03/2022 10:22:24 AM By: Dayton Scrape Entered By: Dayton Scrape on 12/03/2022 06:53:13 -------------------------------------------------------------------------------- Patient/Caregiver Education Details Patient Name: Date of Service: [name] 10/21/2024andnbsp9:30 A M Medical Record Number: [id number] Patient Account Number: [id number] Date of Birth/Gender: Treating [name]: [birth year] (67 y.o. [name] Primary Care Physician: [name] Other Clinician: Referring Physician: Treating Physician/Extender: [name] in Treatment: 13 Education Assessment Education Provided To: Patient Education Topics Provided Wound/Skin Impairment: Methods: Explain/Verbal Responses: State content correctly Electronic Signature(s) Signed: 12/03/2022 5:15:15 PM By: [name] Entered By: Erin Schwalbe on 12/03/2022 14:07:50 [name], [name] ([id number]) [phone].pdf Page 6 of 9 -------------------------------------------------------------------------------- Wound Assessment Details Patient Name: Date of Service: [name], [name] 12/03/2022 9:30 A M Medical Record Number: [id number] Patient Account Number: [id number] Date of Birth/Sex: Treating [name]: 09-26-1955 (67 y.o. [name] Primary Care Elmin Wiederholt: [name] Other Clinician: Referring Nera Haworth: Treating Matea Stanard/Extender: [name] in Treatment: 13 Wound Status Wound Number: 10 Primary Abscess Etiology: Wound Location: Left Abdomen - Lower Quadrant Wound Open Wounding Event: Surgical  Injury Status: Date Acquired: 02/06/2017 Comorbid Cataracts, Lymphedema, Hypertension, Peripheral Venous Weeks Of Treatment: 8 History: Disease, Osteoarthritis Clustered Wound: No Photos Wound Measurements Length: (cm) 1. Width: (cm) 0. Depth: (cm) 0. Area: (cm) 0 Volume: (cm) 0 1 % Reduction in Area: 76.3% 7 % Reduction in Volume: 29% 3 Epithelialization: Medium (34-66%) .605 Tunneling: No .181 Undermining: No Wound Description Classification: Full Thickness Without Exposed Suppor Wound Margin: Distinct, outline attached Exudate Amount: Medium Exudate Type: Serosanguineous Exudate Color: red, brown t Structures Foul Odor After Cleansing: No Slough/Fibrino Yes Wound Bed Granulation Amount: Large (67-100%) Exposed Structure Granulation Quality: Red, Hyper-granulation, Friable Fascia Exposed: No Necrotic Amount: Small (1-33%) Fat Layer (Subcutaneous Tissue) Exposed: Yes Necrotic Quality: Adherent Slough Tendon Exposed: No Muscle Exposed: No Joint Exposed: No Bone Exposed: No Periwound Skin Texture Texture Color No Abnormalities Noted: No No Abnormalities Noted: Yes Scarring: Yes Temperature / Pain Temperature: No Abnormality Moisture No Abnormalities Noted: Yes Treatment Notes Wound #10 (Abdomen - Lower Quadrant) Wound Laterality: Left [name], [name] ([id number]) [id number] Page 7 of 9 Cleanser Soap and Water Discharge Instruction: May shower and wash wound with dial antibacterial soap and water prior to dressing change. Vashe 5.8 (oz) Discharge Instruction: Cleanse the wound with Vashe prior to applying a clean dressing using gauze sponges, not tissue or cotton balls. Wound Cleanser Discharge Instruction: Cleanse the wound with wound cleanser prior to applying a clean dressing using gauze sponges, not tissue or cotton balls. Peri-Wound Care Topical Primary Dressing Maxorb Extra Ag+ Alginate Dressing, 2x2 (in/in) Discharge  Instruction: Apply to wound bed as instructed Secondary Dressing ABD Pad, 8x10 Discharge Instruction: Apply over primary dressing as directed. Secured With [employer] T 2x10 (in/yd) ape Discharge Instruction: Secure with tape as directed. Compression Wrap Compression Stockings Add-Ons Electronic Signature(s) Signed: 12/03/2022 5:15:15 PM By: [name] Entered By: Erin Schwalbe on 12/03/2022 07:07:42 -------------------------------------------------------------------------------- Wound Assessment Details Patient Name: Date of Service:

## 2022-12-03 NOTE — Progress Notes (Signed)
General Health) Medical History: Past Medical History Notes: morbid obesity Eyes Medical History: Positive for: Cataracts Hematologic/Lymphatic Medical History: Positive for: Lymphedema Cardiovascular Medical History: Positive for: Hypertension; Peripheral Venous Disease Past Medical History Notes: schamberg disease, hyperlipidemia Gastrointestinal Medical History: Past Medical History Notes: diverticulitis , h/o obstruction due to diverticulitis , colostomy and colostomy reversal Endocrine Medical History: Past Medical History Notes: hypothyroidism Integumentary (Skin) Medical History: Negative for: History of Burn Musculoskeletal Medical HistoryUZMA, TROLLINGER ([id number]) [id number] Page 14 of 15 Positive for: Osteoarthritis HBO Extended History Items Eyes: Cataracts Immunizations Pneumococcal Vaccine: Received Pneumococcal Vaccination: Yes Received Pneumococcal Vaccination On or After 60th Birthday: Yes Implantable Devices None Hospitalization / Surgery History Type of Hospitalization/Surgery colostomy reversal 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 Family and Social History Cancer: No; Diabetes: No; Heart Disease: Yes - Mother,Father; Hereditary Spherocytosis: No; Hypertension: Yes - Mother,Father; Kidney Disease: Yes - Mother; Lung Disease: No; Seizures: No; Stroke: No; Thyroid Problems: Yes - Mother; Tuberculosis: No;  Never smoker; Marital Status - Single; Alcohol Use: Never; Drug Use: No History; Caffeine Use: Daily - coffee; Financial Concerns: No; Food, Clothing or Shelter Needs: No; Support System Lacking: No; Transportation Concerns: No Electronic Signature(s) Signed: 12/03/2022 11:01:28 AM By: [name] MD FACS Entered By: [name] on 12/03/2022 07:25:27 -------------------------------------------------------------------------------- SuperBill Details Patient Name: Date of Service: Erin Rosier NITA [name] 12/03/2022 Medical Record Number: [id number] Patient Account Number: [id number] Date of Birth/Sex: Treating RN: 1955/05/10 (67 y.o. 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 with other specif Modifier: ied severity Quantity: 1 Physician Procedures : CPT4 Code Description Modifier [id number] 99214 - WC PHYS LEVEL 4 - EST PT 25 ICD-10 Diagnosis Description L97.928 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 F) Primary Care Provider: [name] Other Clinician: Referring Provider: Treating Provider/Extender: Jasmine Pang in Treatment: 13 Constitutional ..... Respiratory Normal work of breathing on room air. Notes 12/03/2022: No significant change to the leg wound. There is a little bit of slough and eschar accumulation. Edema control is good. Electronic Signature(s) Signed: 12/03/2022 10:25:52 AM By: [name] MD FACS Entered By: [name] on 12/03/2022 07:25:52 [name], [name] ([id number]) [id number] Page 6 of 15 -------------------------------------------------------------------------------- Physician Orders Details Patient Name: Date of  Service: [name], [name] 12/03/2022 9:30 A M Medical Record Number: [id number] Patient Account Number: [id number] Date of Birth/Sex: Treating RN: Apr 02, 1955 (67 y.o. 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 - Dr. Lady Gary 12/10/22 at 9:30am Return A Room 3 ppointment in 2 weeks. - Please ask front desk for appointments Return A [facility]charge From [facility] Discharge from [facility] - Please keep wearing compression stockings. Anesthetic (In clinic) Topical Lidocaine 4% applied to wound bed Bathing/ Shower/ Hygiene May shower with protection but do not get wound dressing(s) wet. Protect dressing(s) with water repellant cover (for example, large plastic bag) or a cast cover and may then take shower. Wound Treatment Wound #10 - Abdomen - Lower Quadrant Wound Laterality: Left Cleanser: Soap and Water 1 x Per Day/30 Days Discharge Instructions: May shower and wash wound with dial antibacterial soap and water prior to dressing change. Cleanser: Vashe 5.8 (oz) 1 x Per Day/30 Days Discharge Instructions: Cleanse the wound with Vashe prior to applying a clean dressing using gauze sponges, not tissue or cotton balls. Cleanser: Wound Cleanser 1 x Per Day/30 Days Discharge Instructions: Cleanse the wound with wound cleanser prior to applying a clean dressing using gauze sponges, not tissue or cotton balls. Prim Dressing: Maxorb Extra Ag+ Alginate Dressing, 2x2 (in/in) 1 x Per Day/30 Days ary Discharge Instructions: Apply to wound bed as  instructed Secondary Dressing: ABD Pad, 8x10 1 x Per Day/30 Days Discharge Instructions: Apply over primary dressing as directed. Secured With: 39M Medipore Scientist, research (life sciences) Surgical T 2x10 (in/yd) 1 x Per Day/30 Days ape Discharge Instructions: Secure with tape as directed. Wound #9 - Lower Leg Wound Laterality: Left, Anterior, Distal Cleanser: Vashe 5.8 (oz) 1 x Per Week/30 Days Discharge Instructions: Cleanse the wound with Vashe prior to applying a clean dressing using gauze sponges, not tissue or cotton balls. Topical: Gentamicin 1 x Per Week/30 Days Discharge Instructions: As directed by physician Topical: Mupirocin Ointment 1 x Per Week/30 Days Discharge Instructions: Apply Mupirocin (Bactroban) as instructed Prim Dressing: Promogran Prisma Matrix, 4.34 (sq in) (silver collagen) 1 x Per Week/30 Days ary Discharge Instructions: Moisten collagen with saline or hydrogel Secondary Dressing: ABD Pad, 8x10 1 x Per Week/30 Days Discharge Instructions: Apply over primary dressing as directed. Compression Wrap: FourPress (4 layer compression wrap) 1 x Per Week/30 Days [name], [name] ([id number]) [phone].pdf Page 7 of 15 Discharge Instructions: Apply four layer compression as directed. May also use  General Health) Medical History: Past Medical History Notes: morbid obesity Eyes Medical History: Positive for: Cataracts Hematologic/Lymphatic Medical History: Positive for: Lymphedema Cardiovascular Medical History: Positive for: Hypertension; Peripheral Venous Disease Past Medical History Notes: schamberg disease, hyperlipidemia Gastrointestinal Medical History: Past Medical History Notes: diverticulitis , h/o obstruction due to diverticulitis , colostomy and colostomy reversal Endocrine Medical History: Past Medical History Notes: hypothyroidism Integumentary (Skin) Medical History: Negative for: History of Burn Musculoskeletal Medical HistoryUZMA, TROLLINGER ([id number]) [id number] Page 14 of 15 Positive for: Osteoarthritis HBO Extended History Items Eyes: Cataracts Immunizations Pneumococcal Vaccine: Received Pneumococcal Vaccination: Yes Received Pneumococcal Vaccination On or After 60th Birthday: Yes Implantable Devices None Hospitalization / Surgery History Type of Hospitalization/Surgery colostomy reversal 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 Family and Social History Cancer: No; Diabetes: No; Heart Disease: Yes - Mother,Father; Hereditary Spherocytosis: No; Hypertension: Yes - Mother,Father; Kidney Disease: Yes - Mother; Lung Disease: No; Seizures: No; Stroke: No; Thyroid Problems: Yes - Mother; Tuberculosis: No;  Never smoker; Marital Status - Single; Alcohol Use: Never; Drug Use: No History; Caffeine Use: Daily - coffee; Financial Concerns: No; Food, Clothing or Shelter Needs: No; Support System Lacking: No; Transportation Concerns: No Electronic Signature(s) Signed: 12/03/2022 11:01:28 AM By: [name] MD FACS Entered By: [name] on 12/03/2022 07:25:27 -------------------------------------------------------------------------------- SuperBill Details Patient Name: Date of Service: Erin Rosier NITA [name] 12/03/2022 Medical Record Number: [id number] Patient Account Number: [id number] Date of Birth/Sex: Treating RN: 1955/05/10 (67 y.o. 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 with other specif Modifier: ied severity Quantity: 1 Physician Procedures : CPT4 Code Description Modifier [id number] 99214 - WC PHYS LEVEL 4 - EST PT 25 ICD-10 Diagnosis Description L97.928 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  General Health) Medical History: Past Medical History Notes: morbid obesity Eyes Medical History: Positive for: Cataracts Hematologic/Lymphatic Medical History: Positive for: Lymphedema Cardiovascular Medical History: Positive for: Hypertension; Peripheral Venous Disease Past Medical History Notes: schamberg disease, hyperlipidemia Gastrointestinal Medical History: Past Medical History Notes: diverticulitis , h/o obstruction due to diverticulitis , colostomy and colostomy reversal Endocrine Medical History: Past Medical History Notes: hypothyroidism Integumentary (Skin) Medical History: Negative for: History of Burn Musculoskeletal Medical HistoryUZMA, TROLLINGER ([id number]) [id number] Page 14 of 15 Positive for: Osteoarthritis HBO Extended History Items Eyes: Cataracts Immunizations Pneumococcal Vaccine: Received Pneumococcal Vaccination: Yes Received Pneumococcal Vaccination On or After 60th Birthday: Yes Implantable Devices None Hospitalization / Surgery History Type of Hospitalization/Surgery colostomy reversal 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 Family and Social History Cancer: No; Diabetes: No; Heart Disease: Yes - Mother,Father; Hereditary Spherocytosis: No; Hypertension: Yes - Mother,Father; Kidney Disease: Yes - Mother; Lung Disease: No; Seizures: No; Stroke: No; Thyroid Problems: Yes - Mother; Tuberculosis: No;  Never smoker; Marital Status - Single; Alcohol Use: Never; Drug Use: No History; Caffeine Use: Daily - coffee; Financial Concerns: No; Food, Clothing or Shelter Needs: No; Support System Lacking: No; Transportation Concerns: No Electronic Signature(s) Signed: 12/03/2022 11:01:28 AM By: [name] MD FACS Entered By: [name] on 12/03/2022 07:25:27 -------------------------------------------------------------------------------- SuperBill Details Patient Name: Date of Service: Erin Rosier NITA [name] 12/03/2022 Medical Record Number: [id number] Patient Account Number: [id number] Date of Birth/Sex: Treating RN: 1955/05/10 (67 y.o. 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 with other specif Modifier: ied severity Quantity: 1 Physician Procedures : CPT4 Code Description Modifier [id number] 99214 - WC PHYS LEVEL 4 - EST PT 25 ICD-10 Diagnosis Description L97.928 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 Compression Wrap: Unnaboot w/Calamine, 4x10 (in/yd) 1 x Per Week/30 Days Discharge Instructions: Apply Unnaboot as directed. Compression Stockings: Jobst Farrow Wrap 4000 (DME) Left Leg Compression Amount: 30-40 mmHG Discharge Instructions: Apply Renee Pain daily as instructed. Apply first thing in the morning, remove at night before bed. Electronic Signature(s) Signed: 12/03/2022 11:01:28 AM By: [name] MD FACS Entered By: [name] on 12/03/2022 07:26:14 -------------------------------------------------------------------------------- Problem List Details Patient Name: Date of Service: Erin Rosier NITA [name]  12/03/2022 9:30 A M Medical Record Number: [id number] Patient Account Number: [id number] Date of Birth/Sex: Treating RN: Oct 18, 1955 (67 y.o. 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 with other 08/31/2022 No Yes specified severity 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 08/31/2022 No Yes Inactive Problems ICD-10 Code Description Active Date Inactive Date L98.499 Non-pressure chronic ulcer of skin of other sites with unspecified severity 10/08/2022 10/08/2022 Resolved Problems Electronic Signature(s) Signed: 12/03/2022 10:23:51 AM By: [name] MD FACS Entered By: [name] on 12/03/2022 07:23:51 -------------------------------------------------------------------------------- Progress Note Details Patient Name: Date of Service: Erin Rosier NITA [name] 12/03/2022 9:30 A M Medical Record Number: [id number] Patient Account Number: [id number] Date of Birth/Sex: Treating RN: 10/07/1955 (67 y.o. [name], [name] ([id number]) [id number] Page 8 of 15 Primary Care Provider: [name] Other Clinician: Referring Provider: Treating Provider/Extender: Jasmine Pang in Treatment: 13 Subjective Chief Complaint Information obtained from Patient 04/19/2021: The patient is here for ongoing follow-up regarding 2 left lower extremity wounds. 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 She tells [name] that she fell on the gravel driveway in July.  She developed injuries on her distal lower leg which have not healed. She saw her primary physician on 11/15/2017 who noted her left shin injuries. Gave her antibiotics. At that point the wounds were almost circumferential however most were less than 1.5 cm. Weeping edema fluid was noted. She was referred here for evaluation. The patient has a history of chronic lower extremity edema. 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 She has had prior history with leg weeping fluid. She does not wear compression stockings. She is not doing anything specific to these wound areas. 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 She also has lymphedema. Put her in 3 layer compression and silver alginate last week. She has 3 small wounds with depth just lateral to the tibia. More problematically than this she has numerous shallow areas some of which are almost canal like in shape with tightly adherent painful debris. It would be very difficult and time- consuming to go through this and attempt to individually debride all these areas.. I changed her to collagen today to see if that would help with any of the surface debris on some of these  Urgo K2 compression system as alternative. Compression Wrap: Unnaboot w/Calamine, 4x10 (in/yd) 1 x Per Week/30 Days Discharge Instructions: Apply Unnaboot as directed. Compression Stockings: Jobst Farrow Wrap 4000 (DME) Left Leg Compression Amount: 30-40 mmHG Discharge Instructions: Apply Renee Pain daily as instructed. Apply first thing in the morning, remove at night before bed. Electronic Signature(s) Signed: 12/03/2022 11:01:28 AM By: [name] MD FACS Entered By: [name] on 12/03/2022 07:26:14 -------------------------------------------------------------------------------- Problem List Details Patient Name: Date of Service: Erin Rosier NITA [name]  12/03/2022 9:30 A M Medical Record Number: [id number] Patient Account Number: [id number] Date of Birth/Sex: Treating RN: Oct 18, 1955 (67 y.o. 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 with other 08/31/2022 No Yes specified severity 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 08/31/2022 No Yes Inactive Problems ICD-10 Code Description Active Date Inactive Date L98.499 Non-pressure chronic ulcer of skin of other sites with unspecified severity 10/08/2022 10/08/2022 Resolved Problems Electronic Signature(s) Signed: 12/03/2022 10:23:51 AM By: [name] MD FACS Entered By: [name] on 12/03/2022 07:23:51 -------------------------------------------------------------------------------- Progress Note Details Patient Name: Date of Service: Erin Rosier NITA [name] 12/03/2022 9:30 A M Medical Record Number: [id number] Patient Account Number: [id number] Date of Birth/Sex: Treating RN: 10/07/1955 (67 y.o. [name], [name] ([id number]) [id number] Page 8 of 15 Primary Care Provider: [name] Other Clinician: Referring Provider: Treating Provider/Extender: Jasmine Pang in Treatment: 13 Subjective Chief Complaint Information obtained from Patient 04/19/2021: The patient is here for ongoing follow-up regarding 2 left lower extremity wounds. 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 She tells [name] that she fell on the gravel driveway in July.  She developed injuries on her distal lower leg which have not healed. She saw her primary physician on 11/15/2017 who noted her left shin injuries. Gave her antibiotics. At that point the wounds were almost circumferential however most were less than 1.5 cm. Weeping edema fluid was noted. She was referred here for evaluation. The patient has a history of chronic lower extremity edema. 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 She has had prior history with leg weeping fluid. She does not wear compression stockings. She is not doing anything specific to these wound areas. 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 She also has lymphedema. Put her in 3 layer compression and silver alginate last week. She has 3 small wounds with depth just lateral to the tibia. More problematically than this she has numerous shallow areas some of which are almost canal like in shape with tightly adherent painful debris. It would be very difficult and time- consuming to go through this and attempt to individually debride all these areas.. I changed her to collagen today to see if that would help with any of the surface debris on some of these  General Health) Medical History: Past Medical History Notes: morbid obesity Eyes Medical History: Positive for: Cataracts Hematologic/Lymphatic Medical History: Positive for: Lymphedema Cardiovascular Medical History: Positive for: Hypertension; Peripheral Venous Disease Past Medical History Notes: schamberg disease, hyperlipidemia Gastrointestinal Medical History: Past Medical History Notes: diverticulitis , h/o obstruction due to diverticulitis , colostomy and colostomy reversal Endocrine Medical History: Past Medical History Notes: hypothyroidism Integumentary (Skin) Medical History: Negative for: History of Burn Musculoskeletal Medical HistoryUZMA, TROLLINGER ([id number]) [id number] Page 14 of 15 Positive for: Osteoarthritis HBO Extended History Items Eyes: Cataracts Immunizations Pneumococcal Vaccine: Received Pneumococcal Vaccination: Yes Received Pneumococcal Vaccination On or After 60th Birthday: Yes Implantable Devices None Hospitalization / Surgery History Type of Hospitalization/Surgery colostomy reversal 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 Family and Social History Cancer: No; Diabetes: No; Heart Disease: Yes - Mother,Father; Hereditary Spherocytosis: No; Hypertension: Yes - Mother,Father; Kidney Disease: Yes - Mother; Lung Disease: No; Seizures: No; Stroke: No; Thyroid Problems: Yes - Mother; Tuberculosis: No;  Never smoker; Marital Status - Single; Alcohol Use: Never; Drug Use: No History; Caffeine Use: Daily - coffee; Financial Concerns: No; Food, Clothing or Shelter Needs: No; Support System Lacking: No; Transportation Concerns: No Electronic Signature(s) Signed: 12/03/2022 11:01:28 AM By: [name] MD FACS Entered By: [name] on 12/03/2022 07:25:27 -------------------------------------------------------------------------------- SuperBill Details Patient Name: Date of Service: Erin Rosier NITA [name] 12/03/2022 Medical Record Number: [id number] Patient Account Number: [id number] Date of Birth/Sex: Treating RN: 1955/05/10 (67 y.o. 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 with other specif Modifier: ied severity Quantity: 1 Physician Procedures : CPT4 Code Description Modifier [id number] 99214 - WC PHYS LEVEL 4 - EST PT 25 ICD-10 Diagnosis Description L97.928 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 Compression Wrap: Unnaboot w/Calamine, 4x10 (in/yd) 1 x Per Week/30 Days Discharge Instructions: Apply Unnaboot as directed. Compression Stockings: Jobst Farrow Wrap 4000 (DME) Left Leg Compression Amount: 30-40 mmHG Discharge Instructions: Apply Renee Pain daily as instructed. Apply first thing in the morning, remove at night before bed. Electronic Signature(s) Signed: 12/03/2022 11:01:28 AM By: [name] MD FACS Entered By: [name] on 12/03/2022 07:26:14 -------------------------------------------------------------------------------- Problem List Details Patient Name: Date of Service: Erin Rosier NITA [name]  12/03/2022 9:30 A M Medical Record Number: [id number] Patient Account Number: [id number] Date of Birth/Sex: Treating RN: Oct 18, 1955 (67 y.o. 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 with other 08/31/2022 No Yes specified severity 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 08/31/2022 No Yes Inactive Problems ICD-10 Code Description Active Date Inactive Date L98.499 Non-pressure chronic ulcer of skin of other sites with unspecified severity 10/08/2022 10/08/2022 Resolved Problems Electronic Signature(s) Signed: 12/03/2022 10:23:51 AM By: [name] MD FACS Entered By: [name] on 12/03/2022 07:23:51 -------------------------------------------------------------------------------- Progress Note Details Patient Name: Date of Service: Erin Rosier NITA [name] 12/03/2022 9:30 A M Medical Record Number: [id number] Patient Account Number: [id number] Date of Birth/Sex: Treating RN: 10/07/1955 (67 y.o. [name], [name] ([id number]) [id number] Page 8 of 15 Primary Care Provider: [name] Other Clinician: Referring Provider: Treating Provider/Extender: Jasmine Pang in Treatment: 13 Subjective Chief Complaint Information obtained from Patient 04/19/2021: The patient is here for ongoing follow-up regarding 2 left lower extremity wounds. 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 She tells [name] that she fell on the gravel driveway in July.  She developed injuries on her distal lower leg which have not healed. She saw her primary physician on 11/15/2017 who noted her left shin injuries. Gave her antibiotics. At that point the wounds were almost circumferential however most were less than 1.5 cm. Weeping edema fluid was noted. She was referred here for evaluation. The patient has a history of chronic lower extremity edema. 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 She has had prior history with leg weeping fluid. She does not wear compression stockings. She is not doing anything specific to these wound areas. 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 She also has lymphedema. Put her in 3 layer compression and silver alginate last week. She has 3 small wounds with depth just lateral to the tibia. More problematically than this she has numerous shallow areas some of which are almost canal like in shape with tightly adherent painful debris. It would be very difficult and time- consuming to go through this and attempt to individually debride all these areas.. I changed her to collagen today to see if that would help with any of the surface debris on some of these  07:25:19 -------------------------------------------------------------------------------- Physical Exam Details Patient Name: Date of Service: [name], [name] 12/03/2022 9:30 A M Medical Record Number: [id number] Patient Account Number: [id number] Date of Birth/Sex: Treating RN: Jul 09, 1955 (68 y.o. F) Primary Care Provider: [name] Other Clinician: Referring Provider: Treating Provider/Extender: Jasmine Pang in Treatment: 13 Constitutional ..... Respiratory Normal work of breathing on room air. Notes 12/03/2022: No significant change to the leg wound. There is a little bit of slough and eschar accumulation. Edema control is good. Electronic Signature(s) Signed: 12/03/2022 10:25:52 AM By: [name] MD FACS Entered By: [name] on 12/03/2022 07:25:52 [name], [name] ([id number]) [id number] Page 6 of 15 -------------------------------------------------------------------------------- Physician Orders Details Patient Name: Date of  Service: [name], [name] 12/03/2022 9:30 A M Medical Record Number: [id number] Patient Account Number: [id number] Date of Birth/Sex: Treating RN: Apr 02, 1955 (67 y.o. 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 - Dr. Lady Gary 12/10/22 at 9:30am Return A Room 3 ppointment in 2 weeks. - Please ask front desk for appointments Return A [facility]charge From [facility] Discharge from Wound Care Center - Please keep wearing compression stockings. Anesthetic (In clinic) Topical Lidocaine 4% applied to wound bed Bathing/ Shower/ Hygiene May shower with protection but do not get wound dressing(s) wet. Protect dressing(s) with water repellant cover (for example, large plastic bag) or a cast cover and may then take shower. Wound Treatment Wound #10 - Abdomen - Lower Quadrant Wound Laterality: Left Cleanser: Soap and Water 1 x Per Day/30 Days Discharge Instructions: May shower and wash wound with dial antibacterial soap and water prior to dressing change. Cleanser: Vashe 5.8 (oz) 1 x Per Day/30 Days Discharge Instructions: Cleanse the wound with Vashe prior to applying a clean dressing using gauze sponges, not tissue or cotton balls. Cleanser: Wound Cleanser 1 x Per Day/30 Days Discharge Instructions: Cleanse the wound with wound cleanser prior to applying a clean dressing using gauze sponges, not tissue or cotton balls. Prim Dressing: Maxorb Extra Ag+ Alginate Dressing, 2x2 (in/in) 1 x Per Day/30 Days ary Discharge Instructions: Apply to wound bed as  instructed Secondary Dressing: ABD Pad, 8x10 1 x Per Day/30 Days Discharge Instructions: Apply over primary dressing as directed. Secured With: 39M Medipore Scientist, research (life sciences) Surgical T 2x10 (in/yd) 1 x Per Day/30 Days ape Discharge Instructions: Secure with tape as directed. Wound #9 - Lower Leg Wound Laterality: Left, Anterior, Distal Cleanser: Vashe 5.8 (oz) 1 x Per Week/30 Days Discharge Instructions: Cleanse the wound with Vashe prior to applying a clean dressing using gauze sponges, not tissue or cotton balls. Topical: Gentamicin 1 x Per Week/30 Days Discharge Instructions: As directed by physician Topical: Mupirocin Ointment 1 x Per Week/30 Days Discharge Instructions: Apply Mupirocin (Bactroban) as instructed Prim Dressing: Promogran Prisma Matrix, 4.34 (sq in) (silver collagen) 1 x Per Week/30 Days ary Discharge Instructions: Moisten collagen with saline or hydrogel Secondary Dressing: ABD Pad, 8x10 1 x Per Week/30 Days Discharge Instructions: Apply over primary dressing as directed. Compression Wrap: FourPress (4 layer compression wrap) 1 x Per Week/30 Days [name], [name] ([id number]) [id number].pdf Page 7 of 15 Discharge Instructions: Apply four layer compression as directed. May also use  1 week. - Dr. Lady Gary 12/10/22 at 9:30am Room 3 Return Appointment in 2 weeks. - Please ask front desk for appointments Discharge From [facility] Services: Discharge from Wound Care Center - Please keep wearing compression stockings. Anesthetic: (In clinic) Topical Lidocaine 4% applied to wound bed Bathing/ Shower/ Hygiene: May shower with protection but do not get wound dressing(s) wet. Protect dressing(s) with water repellant cover (for example, large plastic bag) or a cast cover and may then take shower. WOUND #10: - Abdomen - Lower Quadrant Wound Laterality: Left Cleanser: Soap and Water 1 x Per Day/30 Days Discharge Instructions: May shower and wash wound with dial antibacterial soap and water prior to dressing change. Cleanser: Vashe 5.8 (oz) 1 x Per Day/30 Days Discharge Instructions: Cleanse the wound with Vashe prior to applying a clean dressing using gauze sponges, not tissue or cotton balls. Cleanser: Wound Cleanser 1 x Per Day/30 Days Discharge Instructions: Cleanse the wound with wound cleanser prior to applying a clean dressing using gauze sponges, not tissue or cotton balls. Prim Dressing: Maxorb Extra Ag+ Alginate Dressing, 2x2 (in/in) 1 x Per Day/30 Days ary Discharge Instructions: Apply to wound bed as instructed Secondary Dressing: ABD Pad, 8x10 1 x Per Day/30 Days Discharge Instructions: Apply over primary dressing as directed. Secured With: 43M Medipore Scientist, research (life sciences) Surgical T 2x10 (in/yd) 1 x Per Day/30 Days ape Discharge Instructions: Secure with tape as directed. WOUND #9: - Lower Leg Wound Laterality: Left, Anterior, Distal Cleanser: Vashe 5.8 (oz) 1 x Per  Week/30 Days Discharge Instructions: Cleanse the wound with Vashe prior to applying a clean dressing using gauze sponges, not tissue or cotton balls. Topical: Gentamicin 1 x Per Week/30 Days Discharge Instructions: As directed by physician Topical: Mupirocin Ointment 1 x Per Week/30 Days Discharge Instructions: Apply Mupirocin (Bactroban) as instructed Prim Dressing: Promogran Prisma Matrix, 4.34 (sq in) (silver collagen) 1 x Per Week/30 Days ary Discharge Instructions: Moisten collagen with saline or hydrogel Secondary Dressing: ABD Pad, 8x10 1 x Per Week/30 Days Discharge Instructions: Apply over primary dressing as directed. Com pression Wrap: FourPress (4 layer compression wrap) 1 x Per Week/30 Days Discharge Instructions: Apply four layer compression as directed. May also use Urgo K2 compression system as alternative. Com pression Wrap: Unnaboot w/Calamine, 4x10 (in/yd) 1 x Per Week/30 Days Discharge Instructions: Apply Unnaboot as directed. Com pression Stockings: Jobst Farrow Wrap 4000 (DME) Compression Amount: 30-40 mmHg (left) Discharge Instructions: Apply Renee Pain daily as instructed. Apply first thing in the morning, remove at night before bed. KENORA, WIDMER ([id number]) [id number] Page 13 of 15 12/03/2022: No significant change to the leg wound. There is a little bit of slough and eschar accumulation. Edema control is good. We are no longer managing the abdominal site. I used a curette to debride slough and eschar from the leg wound. We will continue the topical mupirocin and gentamicin mixture, but I am going to change the contact layer to Prisma silver collagen to see if we can get some closure once overall. Continue 4-layer compression/equivalent. Follow-up in 1 week. Electronic Signature(s) Signed: 12/03/2022 10:27:01 AM By: [name] MD FACS Entered By: [name] on 12/03/2022  07:27:00 -------------------------------------------------------------------------------- HxROS Details Patient Name: Date of Service: Erin Rosier NITA [name] 12/03/2022 9:30 A M Medical Record Number: [id number] Patient Account Number: [id number] Date of Birth/Sex: Treating RN: 1955/11/25 (67 y.o. F) Primary Care Provider: [name] Other Clinician: Referring Provider: Treating Provider/Extender: Jasmine Pang in Treatment: 13 Information Obtained From Patient Constitutional Symptoms (  [name], [name] ([id number]) [id number] Page 1 of 15 Visit Report for 12/03/2022 Chief Complaint Document Details Patient Name: Date of Service: [name], [name] 12/03/2022 9:30 A M Medical Record Number: [id number] Patient Account Number: [id number] Date of Birth/Sex: Treating RN: 1955-10-20 (67 y.o. F) Primary Care Provider: [name] Other Clinician: Referring Provider: Treating Provider/Extender: Jasmine Pang in Treatment: 13 Information Obtained from: Patient Chief Complaint 04/19/2021: The patient is here for ongoing follow-up regarding 2 left lower extremity wounds. 01/22/2022; patient returns to clinic with a wound on the left anterior lower leg secondary to trauma Electronic Signature(s) Signed: 12/03/2022 10:24:31 AM By: [name] MD FACS Entered By: [name] on 12/03/2022 07:24:31 -------------------------------------------------------------------------------- Debridement Details Patient Name: Date of Service: Erin Rosier NITA [name] 12/03/2022 9:30 A M Medical Record Number: [id number] Patient Account Number: [id number] Date of Birth/Sex: Treating RN: [birth year] (67 y.o. [name] Primary Care Provider: [name] Other Clinician: Referring Provider: Treating Provider/Extender: Jasmine Pang in Treatment: 13 Debridement Performed for Assessment: Wound #9 Left,Distal,Anterior Lower Leg Performed By: Physician [name], MD The following information was scribed by: Karie Schwalbe The information was scribed for: [name] Debridement Type: Debridement Level of Consciousness (Pre-procedure): Awake and Alert Pre-procedure Verification/Time Out Yes - 10:11 Taken: Start Time: 10:11 Pain Control: Lidocaine 5% topical ointment Percent of Wound Bed Debrided: 100% T Area Debrided (cm): otal 0.03 Tissue and other material debrided: Non-Viable, Eschar,  Slough, Slough Level: Non-Viable Tissue Debridement Description: Selective/Open Wound Instrument: Curette Bleeding: Minimum Hemostasis Achieved: Pressure End Time: 10:13 Procedural Pain: 0 Post Procedural Pain: 0 Response to Treatment: Procedure was tolerated well Level of Consciousness (Post- Awake and Alert procedure): Post Debridement Measurements of Total Wound Length: (cm) 0.2 Width: (cm) 0.2 Depth: (cm) 0.1 [name], [name] ([id number]) [id number] Page 2 of 15 Volume: (cm) 0.003 Character of Wound/Ulcer Post Debridement: Improved Post Procedure Diagnosis Same as Pre-procedure Electronic Signature(s) Signed: 12/03/2022 11:01:28 AM By: [name] MD FACS Signed: 12/03/2022 5:15:15 PM By: Karie Schwalbe RN Entered By: Karie Schwalbe on 12/03/2022 07:18:50 -------------------------------------------------------------------------------- HPI Details Patient Name: Date of Service: Erin Philips [name] 12/03/2022 9:30 A M Medical Record Number: [id number] Patient Account Number: [id number] Date of Birth/Sex: Treating RN: [birth year] (67 y.o. 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 She tells [name] that she fell on the gravel driveway in July. She developed injuries on her distal lower leg which have not healed. She saw her primary physician on 11/15/2017 who noted her left shin injuries. Gave her antibiotics. At that point the wounds were almost circumferential however most were less than 1.5 cm. Weeping edema fluid was noted. She was referred here for evaluation. The patient has a history of chronic lower extremity edema. 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 She has had prior history with leg weeping fluid. She does not wear compression stockings. She is not doing anything specific to these wound areas. 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 She has chronic venous inflammation/stasis dermatitis which is  General Health) Medical History: Past Medical History Notes: morbid obesity Eyes Medical History: Positive for: Cataracts Hematologic/Lymphatic Medical History: Positive for: Lymphedema Cardiovascular Medical History: Positive for: Hypertension; Peripheral Venous Disease Past Medical History Notes: schamberg disease, hyperlipidemia Gastrointestinal Medical History: Past Medical History Notes: diverticulitis , h/o obstruction due to diverticulitis , colostomy and colostomy reversal Endocrine Medical History: Past Medical History Notes: hypothyroidism Integumentary (Skin) Medical History: Negative for: History of Burn Musculoskeletal Medical HistoryUZMA, TROLLINGER ([id number]) [id number] Page 14 of 15 Positive for: Osteoarthritis HBO Extended History Items Eyes: Cataracts Immunizations Pneumococcal Vaccine: Received Pneumococcal Vaccination: Yes Received Pneumococcal Vaccination On or After 60th Birthday: Yes Implantable Devices None Hospitalization / Surgery History Type of Hospitalization/Surgery colostomy reversal 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 Family and Social History Cancer: No; Diabetes: No; Heart Disease: Yes - Mother,Father; Hereditary Spherocytosis: No; Hypertension: Yes - Mother,Father; Kidney Disease: Yes - Mother; Lung Disease: No; Seizures: No; Stroke: No; Thyroid Problems: Yes - Mother; Tuberculosis: No;  Never smoker; Marital Status - Single; Alcohol Use: Never; Drug Use: No History; Caffeine Use: Daily - coffee; Financial Concerns: No; Food, Clothing or Shelter Needs: No; Support System Lacking: No; Transportation Concerns: No Electronic Signature(s) Signed: 12/03/2022 11:01:28 AM By: [name] MD FACS Entered By: [name] on 12/03/2022 07:25:27 -------------------------------------------------------------------------------- SuperBill Details Patient Name: Date of Service: Erin Rosier NITA [name] 12/03/2022 Medical Record Number: [id number] Patient Account Number: [id number] Date of Birth/Sex: Treating RN: 1955/05/10 (67 y.o. 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 with other specif Modifier: ied severity Quantity: 1 Physician Procedures : CPT4 Code Description Modifier [id number] 99214 - WC PHYS LEVEL 4 - EST PT 25 ICD-10 Diagnosis Description L97.928 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 Compression Wrap: Unnaboot w/Calamine, 4x10 (in/yd) 1 x Per Week/30 Days Discharge Instructions: Apply Unnaboot as directed. Compression Stockings: Jobst Farrow Wrap 4000 (DME) Left Leg Compression Amount: 30-40 mmHG Discharge Instructions: Apply Renee Pain daily as instructed. Apply first thing in the morning, remove at night before bed. Electronic Signature(s) Signed: 12/03/2022 11:01:28 AM By: [name] MD FACS Entered By: [name] on 12/03/2022 07:26:14 -------------------------------------------------------------------------------- Problem List Details Patient Name: Date of Service: Erin Rosier NITA [name]  12/03/2022 9:30 A M Medical Record Number: [id number] Patient Account Number: [id number] Date of Birth/Sex: Treating RN: Oct 18, 1955 (67 y.o. 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 with other 08/31/2022 No Yes specified severity 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 08/31/2022 No Yes Inactive Problems ICD-10 Code Description Active Date Inactive Date L98.499 Non-pressure chronic ulcer of skin of other sites with unspecified severity 10/08/2022 10/08/2022 Resolved Problems Electronic Signature(s) Signed: 12/03/2022 10:23:51 AM By: [name] MD FACS Entered By: [name] on 12/03/2022 07:23:51 -------------------------------------------------------------------------------- Progress Note Details Patient Name: Date of Service: Erin Rosier NITA [name] 12/03/2022 9:30 A M Medical Record Number: [id number] Patient Account Number: [id number] Date of Birth/Sex: Treating RN: 10/07/1955 (67 y.o. [name], [name] ([id number]) [id number] Page 8 of 15 Primary Care Provider: [name] Other Clinician: Referring Provider: Treating Provider/Extender: Jasmine Pang in Treatment: 13 Subjective Chief Complaint Information obtained from Patient 04/19/2021: The patient is here for ongoing follow-up regarding 2 left lower extremity wounds. 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 She tells [name] that she fell on the gravel driveway in July.  She developed injuries on her distal lower leg which have not healed. She saw her primary physician on 11/15/2017 who noted her left shin injuries. Gave her antibiotics. At that point the wounds were almost circumferential however most were less than 1.5 cm. Weeping edema fluid was noted. She was referred here for evaluation. The patient has a history of chronic lower extremity edema. 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 She has had prior history with leg weeping fluid. She does not wear compression stockings. She is not doing anything specific to these wound areas. 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 She also has lymphedema. Put her in 3 layer compression and silver alginate last week. She has 3 small wounds with depth just lateral to the tibia. More problematically than this she has numerous shallow areas some of which are almost canal like in shape with tightly adherent painful debris. It would be very difficult and time- consuming to go through this and attempt to individually debride all these areas.. I changed her to collagen today to see if that would help with any of the surface debris on some of these  07:25:19 -------------------------------------------------------------------------------- Physical Exam Details Patient Name: Date of Service: [name], [name] 12/03/2022 9:30 A M Medical Record Number: [id number] Patient Account Number: [id number] Date of Birth/Sex: Treating RN: Jul 09, 1955 (68 y.o. F) Primary Care Provider: [name] Other Clinician: Referring Provider: Treating Provider/Extender: Jasmine Pang in Treatment: 13 Constitutional ..... Respiratory Normal work of breathing on room air. Notes 12/03/2022: No significant change to the leg wound. There is a little bit of slough and eschar accumulation. Edema control is good. Electronic Signature(s) Signed: 12/03/2022 10:25:52 AM By: [name] MD FACS Entered By: [name] on 12/03/2022 07:25:52 [name], [name] ([id number]) [id number] Page 6 of 15 -------------------------------------------------------------------------------- Physician Orders Details Patient Name: Date of  Service: [name], [name] 12/03/2022 9:30 A M Medical Record Number: [id number] Patient Account Number: [id number] Date of Birth/Sex: Treating RN: Apr 02, 1955 (67 y.o. 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 - Dr. Lady Gary 12/10/22 at 9:30am Return A Room 3 ppointment in 2 weeks. - Please ask front desk for appointments Return [facility]charge From [facility] Discharge from [facility] - Please keep wearing compression stockings. Anesthetic (In clinic) Topical Lidocaine 4% applied to wound bed Bathing/ Shower/ Hygiene May shower with protection but do not get wound dressing(s) wet. Protect dressing(s) with water repellant cover (for example, large plastic bag) or a cast cover and may then take shower. Wound Treatment Wound #10 - Abdomen - Lower Quadrant Wound Laterality: Left Cleanser: Soap and Water 1 x Per Day/30 Days Discharge Instructions: May shower and wash wound with dial antibacterial soap and water prior to dressing change. Cleanser: Vashe 5.8 (oz) 1 x Per Day/30 Days Discharge Instructions: Cleanse the wound with Vashe prior to applying a clean dressing using gauze sponges, not tissue or cotton balls. Cleanser: Wound Cleanser 1 x Per Day/30 Days Discharge Instructions: Cleanse the wound with wound cleanser prior to applying a clean dressing using gauze sponges, not tissue or cotton balls. Prim Dressing: Maxorb Extra Ag+ Alginate Dressing, 2x2 (in/in) 1 x Per Day/30 Days ary Discharge Instructions: Apply to wound bed as  instructed Secondary Dressing: ABD Pad, 8x10 1 x Per Day/30 Days Discharge Instructions: Apply over primary dressing as directed. Secured With: 39M Medipore Scientist, research (life sciences) Surgical T 2x10 (in/yd) 1 x Per Day/30 Days ape Discharge Instructions: Secure with tape as directed. Wound #9 - Lower Leg Wound Laterality: Left, Anterior, Distal Cleanser: Vashe 5.8 (oz) 1 x Per Week/30 Days Discharge Instructions: Cleanse the wound with Vashe prior to applying a clean dressing using gauze sponges, not tissue or cotton balls. Topical: Gentamicin 1 x Per Week/30 Days Discharge Instructions: As directed by physician Topical: Mupirocin Ointment 1 x Per Week/30 Days Discharge Instructions: Apply Mupirocin (Bactroban) as instructed Prim Dressing: Promogran Prisma Matrix, 4.34 (sq in) (silver collagen) 1 x Per Week/30 Days ary Discharge Instructions: Moisten collagen with saline or hydrogel Secondary Dressing: ABD Pad, 8x10 1 x Per Week/30 Days Discharge Instructions: Apply over primary dressing as directed. Compression Wrap: FourPress (4 layer compression wrap) 1 x Per Week/30 Days [name], [name] ([id number]) [phone].pdf Page 7 of 15 Discharge Instructions: Apply four layer compression as directed. May also use  [name], [name] ([id number]) [id number] Page 1 of 15 Visit Report for 12/03/2022 Chief Complaint Document Details Patient Name: Date of Service: [name], [name] 12/03/2022 9:30 A M Medical Record Number: [id number] Patient Account Number: [id number] Date of Birth/Sex: Treating RN: 1955-10-20 (67 y.o. F) Primary Care Provider: [name] Other Clinician: Referring Provider: Treating Provider/Extender: Jasmine Pang in Treatment: 13 Information Obtained from: Patient Chief Complaint 04/19/2021: The patient is here for ongoing follow-up regarding 2 left lower extremity wounds. 01/22/2022; patient returns to clinic with a wound on the left anterior lower leg secondary to trauma Electronic Signature(s) Signed: 12/03/2022 10:24:31 AM By: [name] MD FACS Entered By: [name] on 12/03/2022 07:24:31 -------------------------------------------------------------------------------- Debridement Details Patient Name: Date of Service: Erin Rosier NITA [name] 12/03/2022 9:30 A M Medical Record Number: [id number] Patient Account Number: [id number] Date of Birth/Sex: Treating RN: [birth year] (67 y.o. [name] Primary Care Provider: [name] Other Clinician: Referring Provider: Treating Provider/Extender: Jasmine Pang in Treatment: 13 Debridement Performed for Assessment: Wound #9 Left,Distal,Anterior Lower Leg Performed By: Physician [name], MD The following information was scribed by: Karie Schwalbe The information was scribed for: [name] Debridement Type: Debridement Level of Consciousness (Pre-procedure): Awake and Alert Pre-procedure Verification/Time Out Yes - 10:11 Taken: Start Time: 10:11 Pain Control: Lidocaine 5% topical ointment Percent of Wound Bed Debrided: 100% T Area Debrided (cm): otal 0.03 Tissue and other material debrided: Non-Viable, Eschar,  Slough, Slough Level: Non-Viable Tissue Debridement Description: Selective/Open Wound Instrument: Curette Bleeding: Minimum Hemostasis Achieved: Pressure End Time: 10:13 Procedural Pain: 0 Post Procedural Pain: 0 Response to Treatment: Procedure was tolerated well Level of Consciousness (Post- Awake and Alert procedure): Post Debridement Measurements of Total Wound Length: (cm) 0.2 Width: (cm) 0.2 Depth: (cm) 0.1 [name], [name] ([id number]) [id number] Page 2 of 15 Volume: (cm) 0.003 Character of Wound/Ulcer Post Debridement: Improved Post Procedure Diagnosis Same as Pre-procedure Electronic Signature(s) Signed: 12/03/2022 11:01:28 AM By: [name] MD FACS Signed: 12/03/2022 5:15:15 PM By: Karie Schwalbe RN Entered By: Karie Schwalbe on 12/03/2022 07:18:50 -------------------------------------------------------------------------------- HPI Details Patient Name: Date of Service: Erin Philips [name] 12/03/2022 9:30 A M Medical Record Number: [id number] Patient Account Number: [id number] Date of Birth/Sex: Treating RN: [birth year] (67 y.o. 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 She tells [name] that she fell on the gravel driveway in July. She developed injuries on her distal lower leg which have not healed. She saw her primary physician on 11/15/2017 who noted her left shin injuries. Gave her antibiotics. At that point the wounds were almost circumferential however most were less than 1.5 cm. Weeping edema fluid was noted. She was referred here for evaluation. The patient has a history of chronic lower extremity edema. 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 She has had prior history with leg weeping fluid. She does not wear compression stockings. She is not doing anything specific to these wound areas. 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 She has chronic venous inflammation/stasis dermatitis which is  Urgo K2 compression system as alternative. Compression Wrap: Unnaboot w/Calamine, 4x10 (in/yd) 1 x Per Week/30 Days Discharge Instructions: Apply Unnaboot as directed. Compression Stockings: Jobst Farrow Wrap 4000 (DME) Left Leg Compression Amount: 30-40 mmHG Discharge Instructions: Apply Renee Pain daily as instructed. Apply first thing in the morning, remove at night before bed. Electronic Signature(s) Signed: 12/03/2022 11:01:28 AM By: [name] MD FACS Entered By: [name] on 12/03/2022 07:26:14 -------------------------------------------------------------------------------- Problem List Details Patient Name: Date of Service: Erin Rosier NITA [name]  12/03/2022 9:30 A M Medical Record Number: [id number] Patient Account Number: [id number] Date of Birth/Sex: Treating RN: Oct 18, 1955 (67 y.o. 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 with other 08/31/2022 No Yes specified severity 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 08/31/2022 No Yes Inactive Problems ICD-10 Code Description Active Date Inactive Date L98.499 Non-pressure chronic ulcer of skin of other sites with unspecified severity 10/08/2022 10/08/2022 Resolved Problems Electronic Signature(s) Signed: 12/03/2022 10:23:51 AM By: [name] MD FACS Entered By: [name] on 12/03/2022 07:23:51 -------------------------------------------------------------------------------- Progress Note Details Patient Name: Date of Service: Erin Rosier NITA [name] 12/03/2022 9:30 A M Medical Record Number: [id number] Patient Account Number: [id number] Date of Birth/Sex: Treating RN: 10/07/1955 (67 y.o. [name], [name] ([id number]) [id number] Page 8 of 15 Primary Care Provider: [name] Other Clinician: Referring Provider: Treating Provider/Extender: Jasmine Pang in Treatment: 13 Subjective Chief Complaint Information obtained from Patient 04/19/2021: The patient is here for ongoing follow-up regarding 2 left lower extremity wounds. 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 She tells [name] that she fell on the gravel driveway in July.  She developed injuries on her distal lower leg which have not healed. She saw her primary physician on 11/15/2017 who noted her left shin injuries. Gave her antibiotics. At that point the wounds were almost circumferential however most were less than 1.5 cm. Weeping edema fluid was noted. She was referred here for evaluation. The patient has a history of chronic lower extremity edema. 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 She has had prior history with leg weeping fluid. She does not wear compression stockings. She is not doing anything specific to these wound areas. 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 She also has lymphedema. Put her in 3 layer compression and silver alginate last week. She has 3 small wounds with depth just lateral to the tibia. More problematically than this she has numerous shallow areas some of which are almost canal like in shape with tightly adherent painful debris. It would be very difficult and time- consuming to go through this and attempt to individually debride all these areas.. I changed her to collagen today to see if that would help with any of the surface debris on some of these  [name], [name] ([id number]) [id number] Page 1 of 15 Visit Report for 12/03/2022 Chief Complaint Document Details Patient Name: Date of Service: [name], [name] 12/03/2022 9:30 A M Medical Record Number: [id number] Patient Account Number: [id number] Date of Birth/Sex: Treating RN: 1955-10-20 (67 y.o. F) Primary Care Provider: [name] Other Clinician: Referring Provider: Treating Provider/Extender: Jasmine Pang in Treatment: 13 Information Obtained from: Patient Chief Complaint 04/19/2021: The patient is here for ongoing follow-up regarding 2 left lower extremity wounds. 01/22/2022; patient returns to clinic with a wound on the left anterior lower leg secondary to trauma Electronic Signature(s) Signed: 12/03/2022 10:24:31 AM By: [name] MD FACS Entered By: [name] on 12/03/2022 07:24:31 -------------------------------------------------------------------------------- Debridement Details Patient Name: Date of Service: Erin Rosier NITA [name] 12/03/2022 9:30 A M Medical Record Number: [id number] Patient Account Number: [id number] Date of Birth/Sex: Treating RN: [birth year] (67 y.o. [name] Primary Care Provider: [name] Other Clinician: Referring Provider: Treating Provider/Extender: Jasmine Pang in Treatment: 13 Debridement Performed for Assessment: Wound #9 Left,Distal,Anterior Lower Leg Performed By: Physician [name], MD The following information was scribed by: Karie Schwalbe The information was scribed for: [name] Debridement Type: Debridement Level of Consciousness (Pre-procedure): Awake and Alert Pre-procedure Verification/Time Out Yes - 10:11 Taken: Start Time: 10:11 Pain Control: Lidocaine 5% topical ointment Percent of Wound Bed Debrided: 100% T Area Debrided (cm): otal 0.03 Tissue and other material debrided: Non-Viable, Eschar,  Slough, Slough Level: Non-Viable Tissue Debridement Description: Selective/Open Wound Instrument: Curette Bleeding: Minimum Hemostasis Achieved: Pressure End Time: 10:13 Procedural Pain: 0 Post Procedural Pain: 0 Response to Treatment: Procedure was tolerated well Level of Consciousness (Post- Awake and Alert procedure): Post Debridement Measurements of Total Wound Length: (cm) 0.2 Width: (cm) 0.2 Depth: (cm) 0.1 [name], [name] ([id number]) [id number] Page 2 of 15 Volume: (cm) 0.003 Character of Wound/Ulcer Post Debridement: Improved Post Procedure Diagnosis Same as Pre-procedure Electronic Signature(s) Signed: 12/03/2022 11:01:28 AM By: [name] MD FACS Signed: 12/03/2022 5:15:15 PM By: Karie Schwalbe RN Entered By: Karie Schwalbe on 12/03/2022 07:18:50 -------------------------------------------------------------------------------- HPI Details Patient Name: Date of Service: Erin Philips [name] 12/03/2022 9:30 A M Medical Record Number: [id number] Patient Account Number: [id number] Date of Birth/Sex: Treating RN: [birth year] (67 y.o. 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 She tells [name] that she fell on the gravel driveway in July. She developed injuries on her distal lower leg which have not healed. She saw her primary physician on 11/15/2017 who noted her left shin injuries. Gave her antibiotics. At that point the wounds were almost circumferential however most were less than 1.5 cm. Weeping edema fluid was noted. She was referred here for evaluation. The patient has a history of chronic lower extremity edema. 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 She has had prior history with leg weeping fluid. She does not wear compression stockings. She is not doing anything specific to these wound areas. 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 She has chronic venous inflammation/stasis dermatitis which is  General Health) Medical History: Past Medical History Notes: morbid obesity Eyes Medical History: Positive for: Cataracts Hematologic/Lymphatic Medical History: Positive for: Lymphedema Cardiovascular Medical History: Positive for: Hypertension; Peripheral Venous Disease Past Medical History Notes: schamberg disease, hyperlipidemia Gastrointestinal Medical History: Past Medical History Notes: diverticulitis , h/o obstruction due to diverticulitis , colostomy and colostomy reversal Endocrine Medical History: Past Medical History Notes: hypothyroidism Integumentary (Skin) Medical History: Negative for: History of Burn Musculoskeletal Medical HistoryUZMA, TROLLINGER ([id number]) [id number] Page 14 of 15 Positive for: Osteoarthritis HBO Extended History Items Eyes: Cataracts Immunizations Pneumococcal Vaccine: Received Pneumococcal Vaccination: Yes Received Pneumococcal Vaccination On or After 60th Birthday: Yes Implantable Devices None Hospitalization / Surgery History Type of Hospitalization/Surgery colostomy reversal 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 Family and Social History Cancer: No; Diabetes: No; Heart Disease: Yes - Mother,Father; Hereditary Spherocytosis: No; Hypertension: Yes - Mother,Father; Kidney Disease: Yes - Mother; Lung Disease: No; Seizures: No; Stroke: No; Thyroid Problems: Yes - Mother; Tuberculosis: No;  Never smoker; Marital Status - Single; Alcohol Use: Never; Drug Use: No History; Caffeine Use: Daily - coffee; Financial Concerns: No; Food, Clothing or Shelter Needs: No; Support System Lacking: No; Transportation Concerns: No Electronic Signature(s) Signed: 12/03/2022 11:01:28 AM By: [name] MD FACS Entered By: [name] on 12/03/2022 07:25:27 -------------------------------------------------------------------------------- SuperBill Details Patient Name: Date of Service: Erin Rosier NITA [name] 12/03/2022 Medical Record Number: [id number] Patient Account Number: [id number] Date of Birth/Sex: Treating RN: 1955/05/10 (67 y.o. 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 with other specif Modifier: ied severity Quantity: 1 Physician Procedures : CPT4 Code Description Modifier [id number] 99214 - WC PHYS LEVEL 4 - EST PT 25 ICD-10 Diagnosis Description L97.928 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

## 2022-12-03 NOTE — Telephone Encounter (Signed)
S/w the pt and she has been scheduled for tele pre op appt 12/12/22 @ 2:20. Med rec and consent are done.     Patient Consent for Virtual Visit        [name] has provided verbal consent on 12/03/2022 for a virtual visit (video or telephone).   CONSENT FOR VIRTUAL VISIT FOR:  [name]  By participating in this virtual visit I agree to the following:  I hereby voluntarily request, consent and authorize [facility] and its employed or contracted physicians, physician assistants, nurse practitioners or other licensed health care professionals (the Practitioner), to provide me with telemedicine health care services (the "Services") as deemed necessary by the treating Practitioner. I acknowledge and consent to receive the Services by the Practitioner via telemedicine. I understand that the telemedicine visit will involve communicating with the Practitioner through live audiovisual communication technology and the disclosure of certain medical information by electronic transmission. I acknowledge that I have been given the opportunity to request an in-person assessment or other available alternative prior to the telemedicine visit and am voluntarily participating in the telemedicine visit.  I understand that I have the right to withhold or withdraw my consent to the use of telemedicine in the course of my care at any time, without affecting my right to future care or treatment, and that the Practitioner or I may terminate the telemedicine visit at any time. I understand that I have the right to inspect all information obtained and/or recorded in the course of the telemedicine visit and may receive copies of available information for a reasonable fee.  I understand that some of the potential risks of receiving the Services via telemedicine include:  Delay or interruption in medical evaluation due to technological equipment failure or disruption; Information transmitted may not  be sufficient (e.g. poor resolution of images) to allow for appropriate medical decision making by the Practitioner; and/or  In rare instances, security protocols could fail, causing a breach of personal health information.  Furthermore, I acknowledge that it is my responsibility to provide information about my medical history, conditions and care that is complete and accurate to the best of my ability. I acknowledge that Practitioner's advice, recommendations, and/or decision may be based on factors not within their control, such as incomplete or inaccurate data provided by me or distortions of diagnostic images or specimens that may result from electronic transmissions. I understand that the practice of medicine is not an exact science and that Practitioner makes no warranties or guarantees regarding treatment outcomes. I acknowledge that a copy of this consent can be made available to me via my patient portal [facility] MyChart), or I can request a printed copy by calling the office of [facility].    I understand that my insurance will be billed for this visit.   I have read or had this consent read to me. I understand the contents of this consent, which adequately explains the benefits and risks of the Services being provided via telemedicine.  I have been provided ample opportunity to ask questions regarding this consent and the Services and have had my questions answered to my satisfaction. I give my informed consent for the services to be provided through the use of telemedicine in my medical care

## 2022-12-04 DIAGNOSIS — I87312 Chronic venous hypertension (idiopathic) with ulcer of left lower extremity: Secondary | ICD-10-CM | POA: Diagnosis not present

## 2022-12-04 DIAGNOSIS — L97928 Non-pressure chronic ulcer of unspecified part of left lower leg with other specified severity: Secondary | ICD-10-CM | POA: Diagnosis not present

## 2022-12-10 ENCOUNTER — Encounter (HOSPITAL_BASED_OUTPATIENT_CLINIC_OR_DEPARTMENT_OTHER): Payer: Commercial Managed Care - PPO | Admitting: General Surgery

## 2022-12-10 DIAGNOSIS — L97928 Non-pressure chronic ulcer of unspecified part of left lower leg with other specified severity: Secondary | ICD-10-CM | POA: Diagnosis not present

## 2022-12-10 DIAGNOSIS — L817 Pigmented purpuric dermatosis: Secondary | ICD-10-CM | POA: Diagnosis not present

## 2022-12-10 DIAGNOSIS — I739 Peripheral vascular disease, unspecified: Secondary | ICD-10-CM | POA: Diagnosis not present

## 2022-12-10 DIAGNOSIS — L98499 Non-pressure chronic ulcer of skin of other sites with unspecified severity: Secondary | ICD-10-CM | POA: Diagnosis not present

## 2022-12-10 DIAGNOSIS — Z6841 Body Mass Index (BMI) 40.0 and over, adult: Secondary | ICD-10-CM | POA: Diagnosis not present

## 2022-12-10 DIAGNOSIS — I890 Lymphedema, not elsewhere classified: Secondary | ICD-10-CM | POA: Diagnosis not present

## 2022-12-10 DIAGNOSIS — I87312 Chronic venous hypertension (idiopathic) with ulcer of left lower extremity: Secondary | ICD-10-CM | POA: Diagnosis not present

## 2022-12-10 DIAGNOSIS — L97822 Non-pressure chronic ulcer of other part of left lower leg with fat layer exposed: Secondary | ICD-10-CM | POA: Diagnosis not present

## 2022-12-12 ENCOUNTER — Ambulatory Visit: Payer: Commercial Managed Care - PPO | Attending: Nurse Practitioner

## 2022-12-12 DIAGNOSIS — Z01810 Encounter for preprocedural cardiovascular examination: Secondary | ICD-10-CM

## 2022-12-12 NOTE — Progress Notes (Signed)
Virtual Visit via Telephone Note   Because of [name]'s co-morbid illnesses, she is at least at moderate risk for complications without adequate follow up.  This format is felt to be most appropriate for this patient at this time.  The patient did not have access to video technology/had technical difficulties with video requiring transitioning to audio format only (telephone).  All issues noted in this document were discussed and addressed.  No physical exam could be performed with this format.  Please refer to the patient's chart for her consent to telehealth for [facility].  Evaluation Performed:  Preoperative cardiovascular risk assessment _____________   Date:  12/12/2022   Patient ID:  [name], DOB [birth year], MRN [id number] Patient Location:  Home Provider location:   Office  Primary Care Provider:  [name], MD Primary Cardiologist:  Maisie Fus, MD  Chief Complaint / Patient Profile   67 y.o. y/o female with a h/o HTN, ED, obesity, DM type II, mild AS, hypothyroidism who is pending wound exploration and suture removal and presents today for telephonic preoperative cardiovascular risk assessment.  History of Present Illness    [name] is a 67 y.o. female who presents via audio/video conferencing for a telehealth visit today.  Pt was last seen in cardiology clinic on 06/19/2022 by Dr. Carolan Clines.  At that time [name] was doing well well-controlled BP and no shortness of breath.The patient is now pending procedure as outlined above. Since her last visit, she has been doing well with no new cardiac complaints.  She has venous ulcers on her legs that are healing and getting better every day.  She denies chest pain, shortness of breath, lower extremity edema, fatigue, palpitations, melena, hematuria, hemoptysis, diaphoresis, weakness, presyncope, syncope, orthopnea, and PND.   Past Medical History    Past Medical History:   Diagnosis Date   Arthritis    DOE (dyspnea on exertion)    mild   Hyperlipidemia    Hypertension    Hypothyroidism    Morbid obesity (HCC)    Pre-diabetes    Schamberg disease    Past Surgical History:  Procedure Laterality Date   BOWEL RESECTION  07/10/2016   Procedure: SMALL BOWEL RESECTION repair of small bowel x 2;  Surgeon: Claud Kelp, MD;  Location: [facility];  Service: General;;   COLON RESECTION SIGMOID  07/10/2016   Procedure: sigmoid colon resection with colostomy ;  Surgeon: Claud Kelp, MD;  Location: [facility];  Service: General;;   COLOSTOMY TAKEDOWN N/A 02/06/2017   Procedure: LAPAROSCOPIC CONVERTED OPEN COLOSTOMY REVISION WITH SMALL BOWEL RESECTION;  Surgeon: Romie Levee, MD;  Location: [facility];  Service: General;  Laterality: N/A;   FLEXIBLE SIGMOIDOSCOPY N/A 07/06/2016   Procedure: Arnell Sieving;  Surgeon: Jeani Hawking, MD;  Location: [facility];  Service: Endoscopy;  Laterality: N/A;   LAPAROSCOPY N/A 07/10/2016   Procedure: LAPAROSCOPY converted to open;  Surgeon: Claud Kelp, MD;  Location: [facility];  Service: General;  Laterality: N/A;   TONSILLECTOMY     age 59 yrs    Allergies  No Known Allergies  Home Medications    Prior to Admission medications   Medication Sig Start Date End Date Taking? Authorizing Provider  amLODipine (NORVASC) 5 MG tablet Take 1 tablet (5 mg total) by mouth daily. 08/21/22   [name], MD  amoxicillin-clavulanate (AUGMENTIN) 875-125 MG tablet Take 1 tablet by mouth 2 (two) times daily. 10/01/22   [name], MD  Calcium Carbonate-Vitamin D 600-400 MG-UNIT tablet Take 1 tablet by mouth daily.    [provider]  Cholecalciferol (VITAMIN D) 2000 units tablet Take 4,000 Units by mouth daily.    [provider]  levothyroxine (SYNTHROID) 112 MCG tablet Take 1 tablet (112 mcg total) by mouth daily. 10/01/22   [name], MD  lisinopril (ZESTRIL) 20 MG tablet Take 1 tablet (20 mg total) by  mouth daily. 06/20/22   [name], MD  Magnesium 400 MG TABS Take by mouth.    [provider]  Multiple Vitamins-Minerals (CENTRUM SILVER 50+WOMEN PO) Take by mouth. 1 per day    [provider]  potassium chloride SA (KLOR-CON M) 20 MEQ tablet Take 1 tablet (20 mEq total) by mouth daily as needed (when taking Torsemide). 08/02/22   [name], MD  simvastatin (ZOCOR) 20 MG tablet Take 1 tablet (20 mg total) by mouth daily. 09/12/22   Arnette Felts, FNP  torsemide (DEMADEX) 20 MG tablet Take 1 tablet (20 mg total) by mouth daily as needed (for edema). 08/02/22   [name], MD    Physical Exam    Vital Signs:  [name] does not have vital signs available for review today.  Given telephonic nature of communication, physical exam is limited. AAOx3. NAD. Normal affect.  Speech and respirations are unlabored.  Accessory Clinical Findings    None  Assessment & Plan    1.  Preoperative Cardiovascular Risk Assessment: -Patient's RCRI score is 0.9% The patient affirms she has been doing well without any new cardiac symptoms. They are able to achieve 5 METS without cardiac limitations. Therefore, based on ACC/AHA guidelines, the patient would be at acceptable risk for the planned procedure without further cardiovascular testing. The patient was advised that if she develops new symptoms prior to surgery to contact our office to arrange for a follow-up visit, and she verbalized understanding.   The patient was advised that if she develops new symptoms prior to surgery to contact our office to arrange for a follow-up visit, and she verbalized understanding.  No medications required to be held  A copy of this note will be routed to requesting surgeon.  Time:   Today, I have spent 6 minutes with the patient with telehealth technology discussing medical history, symptoms, and management plan.     Napoleon Form, Leodis Rains, NP  12/12/2022, 7:50 AM

## 2022-12-12 NOTE — Progress Notes (Signed)
Account Number: [id number] Date of Birth/Sex: Treating RN: [birth year] (67 y.o. [name], [name] ([id number]) [id number] Page 5 of 9 Primary Care [name]: [name] Other Clinician: Referring [name]: Treating [name]/Extender: [name] in Treatment: 14 Active Problems Location of Pain Severity and Description of Pain Patient Has Paino No Site Locations Pain Management and Medication Current Pain Management: Electronic Signature(s) Signed: 12/10/2022 11:50:00 AM By: [name] Entered By: [name] on 12/10/2022 09:57:50 -------------------------------------------------------------------------------- Patient/Caregiver Education Details Patient Name: Date of Service: [name] 10/28/2024andnbsp9:30 A M Medical Record Number: [id number] Patient Account Number: [id number] Date of Birth/Gender: Treating RN: [birth year] (67 y.o. [name] Primary Care Physician: [name] Other Clinician: Referring Physician: Treating Physician/Extender: [name] in Treatment: 14 Education Assessment Education Provided To: Patient Education Topics Provided Wound/Skin Impairment: Methods: Explain/Verbal Responses: State content correctly Electronic Signature(s) Signed: 12/11/2022 6:03:53 PM By: Erin Schwalbe RN Entered By: [name] on 12/10/2022 17:20:40 [name], [name] ([id number]) [id number] Page 6 of 9 -------------------------------------------------------------------------------- Wound Assessment Details Patient Name: Date of Service: [name], [name] 12/10/2022 9:30 A M Medical Record Number: [id number] Patient Account Number: [id number] Date of Birth/Sex: Treating RN: 06-02-1955 (67 y.o. [name] Primary Care [name]: [name] Other Clinician: Referring [name]: Treating [name]/Extender: [name] in Treatment: 14 Wound Status Wound Number: 10 Primary Abscess Etiology: Wound Location: Left Abdomen - Lower Quadrant Wound Open Wounding Event: Surgical Injury Status: Date Acquired: 02/06/2017 Notes: Dr. Romie [name] is taking care of this  wound Weeks Of Treatment: 9 Comorbid Cataracts, Lymphedema, Hypertension, Peripheral Venous Clustered Wound: No History: Disease, Osteoarthritis Photos Wound Measurements Length: (cm) Width: (cm) Depth: (cm) Area: (cm) Volume: (cm) 0 % Reduction in Area: 100% 0 % Reduction in Volume: 100% 0 Epithelialization: Large (67-100%) 0 Tunneling: No 0 Undermining: No Wound Description Classification: Full Thickness Without Exposed Support Structures Wound Margin: Distinct, outline attached Exudate Amount: None Present Foul Odor After Cleansing: No Slough/Fibrino No Wound Bed Granulation Amount: None Present (0%) Exposed Structure Necrotic Amount: None Present (0%) Fascia Exposed: No Fat Layer (Subcutaneous Tissue) Exposed: No Tendon Exposed: No Muscle Exposed: No Joint Exposed: No Bone Exposed: No Periwound Skin Texture Texture Color No Abnormalities Noted: No No Abnormalities Noted: Yes Scarring: No Temperature / Pain Temperature: No Abnormality Moisture No Abnormalities Noted: Yes Treatment Notes Wound #10 (Abdomen - Lower Quadrant) Wound Laterality: Left Cleanser Soap and Water Discharge Instruction: May shower and wash wound with dial antibacterial soap and water prior to dressing change. [name], [name] ([id number]) [id number] Page 7 of 9 Vashe 5.8 (oz) Discharge Instruction: Cleanse the wound with Vashe prior to applying a clean dressing using gauze sponges, not tissue or cotton balls. Wound Cleanser Discharge Instruction: Cleanse the wound with wound cleanser prior to applying a clean dressing using gauze sponges, not tissue or cotton balls. Peri-Wound Care Topical Primary Dressing Maxorb Extra Ag+ Alginate Dressing, 2x2 (in/in) Discharge Instruction: Apply to wound bed as instructed Secondary Dressing ABD Pad, 8x10 Discharge Instruction: Apply over primary dressing as directed. Secured With [employer] T 2x10  (in/yd) ape Discharge Instruction: Secure with tape as directed. Compression Wrap Compression Stockings Add-Ons Electronic Signature(s) Signed: 12/11/2022 6:03:53 PM By: Erin Schwalbe RN Entered By: [name] on 12/10/2022 10:33:59 -------------------------------------------------------------------------------- Wound Assessment Details Patient Name: Date of Service: [name] 12/10/2022 9:30 A M Medical Record Number: [id number] Patient Account Number: [id number] Date of Birth/Sex: Treating RN: [birth year] (67  y.o. F) Primary Care [name]: [name] Other Clinician: Referring [name]: Treating [name]/Extender: [name] in Treatment: 14 Wound Status Wound Number: 9 Primary Lymphedema Etiology: Wound Location: Left, Distal, Anterior Lower Leg Wound Open Wounding Event: Gradually Appeared Status: Date Acquired: 07/14/2022 Comorbid Cataracts, Lymphedema, Hypertension, Peripheral Venous Weeks Of Treatment: 14 History: Disease, Osteoarthritis Clustered Wound: No Photos Wound Measurements Length: (cm) 0.1 Width: (cm) 0.1 Depth: (cm) 0.1 Area: (cm) 0.008 [name], [name] ([id number]) Volume: (cm) 0.001 % Reduction in Area: 99.9% % Reduction in Volume: 99.9% Epithelialization: Medium (34-66%) Tunneling: No [id number] Page 8 of 9 Undermining: No Wound Description Classification: Full Thickness Without Exposed Support Structures Wound Margin: Distinct, outline attached Exudate Amount: Medium Exudate Type: Serosanguineous Exudate Color: red, brown Foul Odor After Cleansing: No Slough/Fibrino Yes Wound Bed Granulation Amount: Medium (34-66%) Exposed Structure Granulation Quality: Pink Fascia Exposed: No Necrotic Amount: Medium (34-66%) Fat Layer (Subcutaneous Tissue) Exposed: Yes Necrotic Quality: Eschar, Adherent Slough Tendon Exposed: No Muscle Exposed: No Joint Exposed: No Bone  Exposed: No Periwound Skin Texture Texture Color No Abnormalities Noted: No No Abnormalities Noted: No Scarring: Yes Hemosiderin Staining: Yes Moisture Temperature / Pain No Abnormalities Noted: Yes Temperature: No Abnormality Treatment Notes Wound #9 (Lower Leg) Wound Laterality: Left, Anterior, Distal Cleanser Vashe 5.8 (oz) Discharge Instruction: Cleanse the wound with Vashe prior to applying a clean dressing using gauze sponges, not tissue or cotton balls. Peri-Wound Care Topical Gentamicin Discharge Instruction: As directed by physician Mupirocin Ointment Discharge Instruction: Apply Mupirocin (Bactroban) as instructed Primary Dressing Endoform 2x2 in Discharge Instruction: Moisten with saline Secondary Dressing ABD Pad, 8x10 Discharge Instruction: Apply over primary dressing as directed. Secured With Compression Wrap FourPress (4 layer compression wrap) Discharge Instruction: Apply four layer compression as directed. May also use Urgo K2 compression system as alternative. Unnaboot w/Calamine, 4x10 (in/yd) Discharge Instruction: Apply Unnaboot as directed. Compression Stockings Jobst Farrow Wrap 4000 Quantity: 1 Left Leg Compression Amount: 30-40 mmHg Discharge Instruction: Apply Renee Pain daily as instructed. Apply first thing in the morning, remove at night before bed. Add-Ons Electronic Signature(s) Signed: 12/11/2022 6:03:53 PM By: Erin Schwalbe RN Entered By: [name] on 12/10/2022 10:12:59 TRANEISHA, TUCHMAN [name] ([id number]) [id number] Page 9 of 9 -------------------------------------------------------------------------------- Vitals Details Patient Name: Date of Service: RHYANNON, BARRINEAU 12/10/2022 9:30 A M Medical Record Number: [id number] Patient Account Number: [id number] Date of Birth/Sex: Treating RN: October 07, 1955 (67 y.o. F) Primary Care Nico Rogness: [name] Other Clinician: Referring Sherese Heyward: Treating  Salih Williamson/Extender: [name] in Treatment: 14 Vital Signs Time Taken: 09:57 Temperature (F): 97.9 Height (in): 62 Pulse (bpm): 78 Weight (lbs): 221 Respiratory Rate (breaths/min): 18 Body Mass Index (BMI): 40.4 Blood Pressure (mmHg): 146/80 Reference Range: 80 - 120 mg / dl Electronic Signature(s) Signed: 12/10/2022 11:50:00 AM By: [name] Entered By: [name] on 12/10/2022 09:57:44  [id number] Date of Birth/Sex: Treating RN: 1955-10-31 (67 y.o. F) Primary Care Nilam Quakenbush: [name] Other Clinician: Referring Samhitha Rosen: Treating Lenoria Narine/Extender: [name] in Treatment: 14 Vital Signs Height(in): 62 Pulse(bpm): 78 Weight(lbs): 221 Blood Pressure(mmHg): 146/80 Body Mass Index(BMI): 40.4 Temperature(F): 97.9 Respiratory Rate(breaths/min): 18 [10:Photos:] [N/A:N/A] Left Abdomen - Lower Quadrant Left, Distal, Anterior Lower Leg N/A Wound Location: Surgical Injury Gradually Appeared N/A Wounding Event: Abscess Lymphedema N/A Primary Etiology: Cataracts, Lymphedema, Cataracts, Lymphedema, N/A Comorbid History: Hypertension, Peripheral Venous Hypertension, Peripheral Venous Disease, Osteoarthritis Disease, Osteoarthritis 02/06/2017 07/14/2022 N/A Date Acquired: 9 14 N/A Weeks of Treatment: Open Open N/A Wound Status: No No N/A Wound Recurrence: 1.1x0.7x0.3 0.1x0.1x0.1 N/A Measurements L x W x [name] 0.605 0.008 N/A A (cm) : rea 0.181 0.001 N/A Volume (cm) : 76.30%  99.90% N/A % Reduction in A rea: 29.00% 99.90% N/A % Reduction in Volume: Full Thickness Without Exposed Full Thickness Without Exposed N/A Classification: Support Structures Support Structures Medium Medium N/A Exudate A mount: Serosanguineous Serosanguineous N/A Exudate Type: red, brown red, brown N/A Exudate Color: Distinct, outline attached Distinct, outline attached N/A Wound Margin: Large (67-100%) Medium (34-66%) N/A Granulation A mount: Red, Hyper-granulation, Friable Pink N/A Granulation Quality: Small (1-33%) Medium (34-66%) N/A Necrotic A mount: Adherent Slough Eschar, Adherent Slough N/A Necrotic Tissue: Fat Layer (Subcutaneous Tissue): Yes Fat Layer (Subcutaneous Tissue): Yes N/A Exposed Structures: Fascia: No Fascia: No Tendon: No Tendon: No Muscle: No Muscle: No Joint: No Joint: No Bone: No Bone: No Medium (34-66%) Medium (34-66%) N/A Epithelialization: N/A Debridement - Selective/Open Wound N/A Debridement: Pre-procedure Verification/Time Out N/A 10:24 N/A Taken: N/A Lidocaine 4% Topical Solution N/A Pain Control: N/A Slough N/A Tissue Debrided: N/A Skin/Epidermis N/A Level: N/A 0.01 N/A Debridement A (sq cm): rea N/A Curette N/A Instrument: N/A Minimum N/A Bleeding: N/A Pressure N/A Hemostasis A chieved: N/A 0 N/A Procedural Pain: N/A 0 N/A Post Procedural Pain: N/A Procedure was tolerated well N/A Debridement Treatment Response: N/A 0.1x0.1x0.1 N/A Post Debridement Measurements L x W x [name] [name], [name] ([id number]) [phone].pdf Page 4 of 9 N/A 0.001 N/A Post Debridement Volume: (cm) Scarring: Yes Scarring: Yes N/A Periwound Skin Texture: No Abnormalities Noted No Abnormalities Noted N/A Periwound Skin Moisture: No Abnormalities Noted Hemosiderin Staining: Yes N/A Periwound Skin Color: No Abnormality No Abnormality N/A Temperature: N/A Compression Therapy N/A Procedures  Performed: Debridement Treatment Notes Electronic Signature(s) Signed: 12/10/2022 10:31:41 AM By: [name] MD FACS Entered By: [name] on 12/10/2022 10:31:41 -------------------------------------------------------------------------------- Multi-Disciplinary Care Plan Details Patient Name: Date of Service: [name] 12/10/2022 9:30 A M Medical Record Number: [id number] Patient Account Number: [id number] Date of Birth/Sex: Treating RN: 1955-10-12 (67 y.o. [name] Primary Care Leslyn Monda: [name] Other Clinician: Referring Anelisse Jacobson: Treating [place] Retana/Extender: [name] in Treatment: 14 Active Inactive Wound/Skin Impairment Nursing Diagnoses: Impaired tissue integrity Goals: Patient/caregiver will verbalize understanding of skin care regimen Date Initiated: 09/03/2022 Target Resolution Date: 02/11/2023 Goal Status: Active Interventions: Assess patient/caregiver ability to obtain necessary supplies Assess patient/caregiver ability to perform ulcer/skin care regimen upon admission and as needed Assess ulceration(s) every visit Provide education on ulcer and skin care Screen for HBO Treatment Activities: Skin care regimen initiated : 08/31/2022 Topical wound management initiated : 08/31/2022 Notes: Electronic Signature(s) Signed: 12/11/2022 6:03:53 PM By: Erin Schwalbe RN Entered By: [name] on 12/10/2022 17:19:06 -------------------------------------------------------------------------------- Pain Assessment Details Patient Name: Date of Service: [name], [name] 12/10/2022 9:30 A M Medical Record Number: [id number] Patient  Account Number: [id number] Date of Birth/Sex: Treating RN: [birth year] (67 y.o. [name], [name] ([id number]) [id number] Page 5 of 9 Primary Care [name]: [name] Other Clinician: Referring [name]: Treating [name]/Extender: [name] in Treatment: 14 Active Problems Location of Pain Severity and Description of Pain Patient Has Paino No Site Locations Pain Management and Medication Current Pain Management: Electronic Signature(s) Signed: 12/10/2022 11:50:00 AM By: [name] Entered By: [name] on 12/10/2022 09:57:50 -------------------------------------------------------------------------------- Patient/Caregiver Education Details Patient Name: Date of Service: [name] 10/28/2024andnbsp9:30 A M Medical Record Number: [id number] Patient Account Number: [id number] Date of Birth/Gender: Treating RN: [birth year] (67 y.o. [name] Primary Care Physician: [name] Other Clinician: Referring Physician: Treating Physician/Extender: [name] in Treatment: 14 Education Assessment Education Provided To: Patient Education Topics Provided Wound/Skin Impairment: Methods: Explain/Verbal Responses: State content correctly Electronic Signature(s) Signed: 12/11/2022 6:03:53 PM By: Erin Schwalbe RN Entered By: [name] on 12/10/2022 17:20:40 [name], [name] ([id number]) [id number] Page 6 of 9 -------------------------------------------------------------------------------- Wound Assessment Details Patient Name: Date of Service: [name], [name] 12/10/2022 9:30 A M Medical Record Number: [id number] Patient Account Number: [id number] Date of Birth/Sex: Treating RN: 06-02-1955 (67 y.o. [name] Primary Care [name]: [name] Other Clinician: Referring [name]: Treating [name]/Extender: [name] in Treatment: 14 Wound Status Wound Number: 10 Primary Abscess Etiology: Wound Location: Left Abdomen - Lower Quadrant Wound Open Wounding Event: Surgical Injury Status: Date Acquired: 02/06/2017 Notes: Dr. Romie [name] is taking care of this  wound Weeks Of Treatment: 9 Comorbid Cataracts, Lymphedema, Hypertension, Peripheral Venous Clustered Wound: No History: Disease, Osteoarthritis Photos Wound Measurements Length: (cm) Width: (cm) Depth: (cm) Area: (cm) Volume: (cm) 0 % Reduction in Area: 100% 0 % Reduction in Volume: 100% 0 Epithelialization: Large (67-100%) 0 Tunneling: No 0 Undermining: No Wound Description Classification: Full Thickness Without Exposed Support Structures Wound Margin: Distinct, outline attached Exudate Amount: None Present Foul Odor After Cleansing: No Slough/Fibrino No Wound Bed Granulation Amount: None Present (0%) Exposed Structure Necrotic Amount: None Present (0%) Fascia Exposed: No Fat Layer (Subcutaneous Tissue) Exposed: No Tendon Exposed: No Muscle Exposed: No Joint Exposed: No Bone Exposed: No Periwound Skin Texture Texture Color No Abnormalities Noted: No No Abnormalities Noted: Yes Scarring: No Temperature / Pain Temperature: No Abnormality Moisture No Abnormalities Noted: Yes Treatment Notes Wound #10 (Abdomen - Lower Quadrant) Wound Laterality: Left Cleanser Soap and Water Discharge Instruction: May shower and wash wound with dial antibacterial soap and water prior to dressing change. [name], [name] ([id number]) [id number] Page 7 of 9 Vashe 5.8 (oz) Discharge Instruction: Cleanse the wound with Vashe prior to applying a clean dressing using gauze sponges, not tissue or cotton balls. Wound Cleanser Discharge Instruction: Cleanse the wound with wound cleanser prior to applying a clean dressing using gauze sponges, not tissue or cotton balls. Peri-Wound Care Topical Primary Dressing Maxorb Extra Ag+ Alginate Dressing, 2x2 (in/in) Discharge Instruction: Apply to wound bed as instructed Secondary Dressing ABD Pad, 8x10 Discharge Instruction: Apply over primary dressing as directed. Secured With [employer] T 2x10  (in/yd) ape Discharge Instruction: Secure with tape as directed. Compression Wrap Compression Stockings Add-Ons Electronic Signature(s) Signed: 12/11/2022 6:03:53 PM By: Erin Schwalbe RN Entered By: [name] on 12/10/2022 10:33:59 -------------------------------------------------------------------------------- Wound Assessment Details Patient Name: Date of Service: [name] 12/10/2022 9:30 A M Medical Record Number: [id number] Patient Account Number: [id number] Date of Birth/Sex: Treating RN: [birth year] (67

## 2022-12-12 NOTE — Progress Notes (Signed)
bit of eschar and slough present. Edema control is good. Electronic Signature(s) Signed: 12/10/2022 10:33:56 AM By: [name] MD FACS Entered By: [name] on 12/10/2022 10:33:55 -------------------------------------------------------------------------------- Physical Exam Details Patient Name: Date of Service: [name] [name]. 12/10/2022 9:30 A M Medical Record Number: [id number] Patient Account Number: [id number] Date of Birth/Sex: Treating RN: February 15, 1955 (67 y.o. F) Primary Care Provider: [name] Other Clinician: Referring Provider: Treating Provider/Extender: Jasmine Pang in Treatment: 14 Constitutional Slightly hypertensive. . . . no acute distress. Respiratory Normal work of breathing on room air.. Notes 12/10/2022: The wound on her leg remains unchanged. There is a little bit of eschar and slough present. Edema control is good. Electronic Signature(s) Signed: 12/10/2022 10:34:33 AM By: [name] MD FACS Entered By: [name] on 12/10/2022 10:34:33 KARSON, SLATER D ([id number])  [id number] Page 6 of 15 -------------------------------------------------------------------------------- Physician Orders Details Patient Name: Date of Service: [name], Erin 12/10/2022 9:30 A M Medical Record Number: [id number] Patient Account Number: [id number] Date of Birth/Sex: Treating RN: 02/14/1955 (67 y.o. 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 - Dr. Lady Gary 12/17/22 at 9:30am Return A Room 3 ppointment in 2 weeks. - Please ask front desk for appointments Return A Discharge From [facility] - Please keep wearing compression stockings. Anesthetic (In clinic) Topical Lidocaine 4% applied to wound bed Bathing/ Shower/ Hygiene May shower with protection but do not get wound dressing(s) wet. Protect dressing(s) with water repellant cover (for example, large plastic bag) or a cast cover and may then take shower. Lymphedema Treatment Plan - Exercise, Compression and Elevation Left Lower Extremity Other: - Jobst Farrow wraps ordered-Pt. has this garment. Wound Treatment Wound #10 - Abdomen - Lower Quadrant Wound Laterality: Left Cleanser: Soap and Water 1 x Per Day/30 Days Discharge Instructions: May shower and wash wound with dial antibacterial soap and water prior to dressing change. Cleanser: Vashe 5.8 (oz) 1 x Per Day/30 Days Discharge Instructions: Cleanse the wound with Vashe prior to applying a clean dressing using gauze sponges, not tissue or cotton balls. Cleanser:  Wound Cleanser 1 x Per Day/30 Days Discharge Instructions: Cleanse the wound with wound cleanser prior to applying a clean dressing using gauze sponges, not tissue or cotton balls. Prim Dressing: Maxorb Extra Ag+ Alginate Dressing, 2x2 (in/in) 1 x Per Day/30 Days ary Discharge Instructions: Apply to wound bed as instructed Secondary Dressing: ABD Pad, 8x10 1 x Per Day/30 Days Discharge Instructions: Apply over primary dressing as directed. 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 Wound #9 - Lower Leg Wound Laterality: Left, Anterior, Distal Cleanser: Vashe 5.8 (oz) 1 x Per Week/30 Days Discharge Instructions: Cleanse the wound with Vashe prior to applying a clean dressing using gauze sponges, not tissue or cotton balls. Topical: Gentamicin 1 x Per Week/30 Days Discharge Instructions: As directed by physician Topical: Mupirocin Ointment 1 x Per Week/30 Days Discharge Instructions: Apply Mupirocin (Bactroban) as instructed Prim Dressing: Endoform 2x2 in 1 x Per Week/30 Days ary Discharge Instructions: Moisten with saline DILYS, MINTURN ([id number])  [phone].pdf Page 7 of 15 Secondary Dressing: ABD Pad, 8x10 1 x Per Week/30 Days Discharge Instructions: Apply over primary dressing as directed. Compression Wrap: FourPress (4 layer compression wrap) 1 x Per Week/30 Days Discharge Instructions: Apply four layer compression as directed. May also use Urgo K2 compression system as alternative. Compression Wrap: Unnaboot w/Calamine, 4x10 (in/yd) 1 x Per Week/30 Days Discharge Instructions: Apply Unnaboot as directed. Compression Stockings: Jobst Farrow Wrap 4000 Left Leg Compression Amount: 30-40 mmHG Discharge Instructions: Apply Renee Pain daily as instructed. Apply first thing in the morning, remove at night before bed. Electronic Signature(s) Signed: 12/10/2022  10:36:36 AM By: [name] MD FACS Entered By: [name] on 12/10/2022 10:35:04 -------------------------------------------------------------------------------- Problem List Details Patient Name: Date of Service: [name] [name]. 12/10/2022 9:30 A M Medical Record Number: [id number] Patient Account Number: [id number] Date of Birth/Sex: Treating RN: May 19, 1955 (67 y.o. 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 F) Primary Care Provider: [name] Other Clinician: Referring Provider: Treating Provider/Extender: Jasmine Pang in Treatment: 14 Subjective Chief Complaint Information obtained from Patient 04/19/2021: The patient is here for ongoing follow-up regarding 2 left lower extremity wounds. 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 She tells [name] that she fell on the gravel driveway in July. She developed injuries on her distal lower leg which have not healed. She saw her primary physician on 11/15/2017 who noted her left shin injuries. Gave her antibiotics. At that point the wounds were almost circumferential however most were less than 1.5 cm. Weeping edema fluid was noted. She was referred here for evaluation. The patient has a history of chronic lower extremity edema. 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 She has had prior history with leg weeping fluid. She does not wear compression stockings. She is not doing anything specific to these wound areas. 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 She also has lymphedema. Put her in 3 layer compression and silver alginate last week. She has 3 small wounds with depth just lateral to the tibia. More problematically than this she has numerous shallow areas some of which are almost canal like in shape with tightly  [phone].pdf Page 7 of 15 Secondary Dressing: ABD Pad, 8x10 1 x Per Week/30 Days Discharge Instructions: Apply over primary dressing as directed. Compression Wrap: FourPress (4 layer compression wrap) 1 x Per Week/30 Days Discharge Instructions: Apply four layer compression as directed. May also use Urgo K2 compression system as alternative. Compression Wrap: Unnaboot w/Calamine, 4x10 (in/yd) 1 x Per Week/30 Days Discharge Instructions: Apply Unnaboot as directed. Compression Stockings: Jobst Farrow Wrap 4000 Left Leg Compression Amount: 30-40 mmHG Discharge Instructions: Apply Renee Pain daily as instructed. Apply first thing in the morning, remove at night before bed. Electronic Signature(s) Signed: 12/10/2022  10:36:36 AM By: [name] MD FACS Entered By: [name] on 12/10/2022 10:35:04 -------------------------------------------------------------------------------- Problem List Details Patient Name: Date of Service: [name] [name]. 12/10/2022 9:30 A M Medical Record Number: [id number] Patient Account Number: [id number] Date of Birth/Sex: Treating RN: May 19, 1955 (67 y.o. 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 F) Primary Care Provider: [name] Other Clinician: Referring Provider: Treating Provider/Extender: Jasmine Pang in Treatment: 14 Subjective Chief Complaint Information obtained from Patient 04/19/2021: The patient is here for ongoing follow-up regarding 2 left lower extremity wounds. 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 She tells [name] that she fell on the gravel driveway in July. She developed injuries on her distal lower leg which have not healed. She saw her primary physician on 11/15/2017 who noted her left shin injuries. Gave her antibiotics. At that point the wounds were almost circumferential however most were less than 1.5 cm. Weeping edema fluid was noted. She was referred here for evaluation. The patient has a history of chronic lower extremity edema. 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 She has had prior history with leg weeping fluid. She does not wear compression stockings. She is not doing anything specific to these wound areas. 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 She also has lymphedema. Put her in 3 layer compression and silver alginate last week. She has 3 small wounds with depth just lateral to the tibia. More problematically than this she has numerous shallow areas some of which are almost canal like in shape with tightly  [name], [name] ([id number]) [id number] Page 1 of 15 Visit Report for 12/10/2022 Chief Complaint Document Details Patient Name: Date of Service: ASHANTAE, BRECK 12/10/2022 9:30 A M Medical Record Number: [id number] Patient Account Number: [id number] Date of Birth/Sex: Treating RN: 17-Jul-1955 (67 y.o. F) Primary Care Provider: [name] Other Clinician: Referring Provider: Treating Provider/Extender: Jasmine Pang in Treatment: 14 Information Obtained from: Patient Chief Complaint 04/19/2021: The patient is here for ongoing follow-up regarding 2 left lower extremity wounds. 01/22/2022; patient returns to clinic with a wound on the left anterior lower leg secondary to trauma Electronic Signature(s) Signed: 12/10/2022 10:33:15 AM By: [name] MD FACS Entered By: [name] on 12/10/2022 10:33:15 -------------------------------------------------------------------------------- Debridement Details Patient Name: Date of Service: [name] [name]. 12/10/2022 9:30 A M Medical Record Number: [id number] Patient Account Number: [id number] Date of Birth/Sex: Treating RN: Jul 12, 1955 (67 y.o. [name] Primary Care Provider: [name] Other Clinician: Referring Provider: Treating Provider/Extender: Jasmine Pang in Treatment: 14 Debridement Performed for Assessment: Wound #9 Left,Distal,Anterior Lower Leg Performed By: Physician [name], MD The following information was scribed by: Karie Schwalbe The information was scribed for: [name] Debridement Type: Debridement Level of Consciousness (Pre-procedure): Awake and Alert Pre-procedure Verification/Time Out Yes - 10:24 Taken: Start Time: 10:24 Pain Control: Lidocaine 4% T opical Solution Percent of Wound Bed Debrided: 100% T Area Debrided (cm): otal 0.01 Tissue and other material debrided: Viable, Non-Viable,  Slough, Skin: Dermis , Skin: Epidermis, Slough Level: Skin/Epidermis Debridement Description: Selective/Open Wound Instrument: Curette Bleeding: Minimum Hemostasis Achieved: Pressure End Time: 10:25 Procedural Pain: 0 Post Procedural Pain: 0 Response to Treatment: Procedure was tolerated well Level of Consciousness (Post- Awake and Alert procedure): Post Debridement Measurements of Total Wound Length: (cm) 0.1 Width: (cm) 0.1 Depth: (cm) 0.1 ZYANYA, WORMALD D ([id number]) [id number] Page 2 of 15 Volume: (cm) 0.001 Character of Wound/Ulcer Post Debridement: Improved Post Procedure Diagnosis Same as Pre-procedure Electronic Signature(s) Signed: 12/10/2022 10:36:36 AM By: [name] MD FACS Signed: 12/11/2022 6:03:53 PM By: Karie Schwalbe RN Entered By: Karie Schwalbe on 12/10/2022 10:28:02 -------------------------------------------------------------------------------- HPI Details Patient Name: Date of Service: [name] 12/10/2022 9:30 A M Medical Record Number: [id number] Patient Account Number: [id number] Date of Birth/Sex: Treating RN: Jul 13, 1955 (67 y.o. 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 She tells [name] that she fell on the gravel driveway in July. She developed injuries on her distal lower leg which have not healed. She saw her primary physician on 11/15/2017 who noted her left shin injuries. Gave her antibiotics. At that point the wounds were almost circumferential however most were less than 1.5 cm. Weeping edema fluid was noted. She was referred here for evaluation. The patient has a history of chronic lower extremity edema. 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 She has had prior history with leg weeping fluid. She does not wear compression stockings. She is not doing anything specific to these wound areas. 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 She has chronic  [phone].pdf Page 7 of 15 Secondary Dressing: ABD Pad, 8x10 1 x Per Week/30 Days Discharge Instructions: Apply over primary dressing as directed. Compression Wrap: FourPress (4 layer compression wrap) 1 x Per Week/30 Days Discharge Instructions: Apply four layer compression as directed. May also use Urgo K2 compression system as alternative. Compression Wrap: Unnaboot w/Calamine, 4x10 (in/yd) 1 x Per Week/30 Days Discharge Instructions: Apply Unnaboot as directed. Compression Stockings: Jobst Farrow Wrap 4000 Left Leg Compression Amount: 30-40 mmHG Discharge Instructions: Apply Renee Pain daily as instructed. Apply first thing in the morning, remove at night before bed. Electronic Signature(s) Signed: 12/10/2022  10:36:36 AM By: [name] MD FACS Entered By: [name] on 12/10/2022 10:35:04 -------------------------------------------------------------------------------- Problem List Details Patient Name: Date of Service: [name] [name]. 12/10/2022 9:30 A M Medical Record Number: [id number] Patient Account Number: [id number] Date of Birth/Sex: Treating RN: May 19, 1955 (67 y.o. 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 F) Primary Care Provider: [name] Other Clinician: Referring Provider: Treating Provider/Extender: Jasmine Pang in Treatment: 14 Subjective Chief Complaint Information obtained from Patient 04/19/2021: The patient is here for ongoing follow-up regarding 2 left lower extremity wounds. 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 She tells [name] that she fell on the gravel driveway in July. She developed injuries on her distal lower leg which have not healed. She saw her primary physician on 11/15/2017 who noted her left shin injuries. Gave her antibiotics. At that point the wounds were almost circumferential however most were less than 1.5 cm. Weeping edema fluid was noted. She was referred here for evaluation. The patient has a history of chronic lower extremity edema. 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 She has had prior history with leg weeping fluid. She does not wear compression stockings. She is not doing anything specific to these wound areas. 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 She also has lymphedema. Put her in 3 layer compression and silver alginate last week. She has 3 small wounds with depth just lateral to the tibia. More problematically than this she has numerous shallow areas some of which are almost canal like in shape with tightly  [phone].pdf Page 7 of 15 Secondary Dressing: ABD Pad, 8x10 1 x Per Week/30 Days Discharge Instructions: Apply over primary dressing as directed. Compression Wrap: FourPress (4 layer compression wrap) 1 x Per Week/30 Days Discharge Instructions: Apply four layer compression as directed. May also use Urgo K2 compression system as alternative. Compression Wrap: Unnaboot w/Calamine, 4x10 (in/yd) 1 x Per Week/30 Days Discharge Instructions: Apply Unnaboot as directed. Compression Stockings: Jobst Farrow Wrap 4000 Left Leg Compression Amount: 30-40 mmHG Discharge Instructions: Apply Renee Pain daily as instructed. Apply first thing in the morning, remove at night before bed. Electronic Signature(s) Signed: 12/10/2022  10:36:36 AM By: [name] MD FACS Entered By: [name] on 12/10/2022 10:35:04 -------------------------------------------------------------------------------- Problem List Details Patient Name: Date of Service: [name] [name]. 12/10/2022 9:30 A M Medical Record Number: [id number] Patient Account Number: [id number] Date of Birth/Sex: Treating RN: May 19, 1955 (67 y.o. 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 F) Primary Care Provider: [name] Other Clinician: Referring Provider: Treating Provider/Extender: Jasmine Pang in Treatment: 14 Subjective Chief Complaint Information obtained from Patient 04/19/2021: The patient is here for ongoing follow-up regarding 2 left lower extremity wounds. 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 She tells [name] that she fell on the gravel driveway in July. She developed injuries on her distal lower leg which have not healed. She saw her primary physician on 11/15/2017 who noted her left shin injuries. Gave her antibiotics. At that point the wounds were almost circumferential however most were less than 1.5 cm. Weeping edema fluid was noted. She was referred here for evaluation. The patient has a history of chronic lower extremity edema. 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 She has had prior history with leg weeping fluid. She does not wear compression stockings. She is not doing anything specific to these wound areas. 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 She also has lymphedema. Put her in 3 layer compression and silver alginate last week. She has 3 small wounds with depth just lateral to the tibia. More problematically than this she has numerous shallow areas some of which are almost canal like in shape with tightly  [phone].pdf Page 7 of 15 Secondary Dressing: ABD Pad, 8x10 1 x Per Week/30 Days Discharge Instructions: Apply over primary dressing as directed. Compression Wrap: FourPress (4 layer compression wrap) 1 x Per Week/30 Days Discharge Instructions: Apply four layer compression as directed. May also use Urgo K2 compression system as alternative. Compression Wrap: Unnaboot w/Calamine, 4x10 (in/yd) 1 x Per Week/30 Days Discharge Instructions: Apply Unnaboot as directed. Compression Stockings: Jobst Farrow Wrap 4000 Left Leg Compression Amount: 30-40 mmHG Discharge Instructions: Apply Renee Pain daily as instructed. Apply first thing in the morning, remove at night before bed. Electronic Signature(s) Signed: 12/10/2022  10:36:36 AM By: [name] MD FACS Entered By: [name] on 12/10/2022 10:35:04 -------------------------------------------------------------------------------- Problem List Details Patient Name: Date of Service: [name] [name]. 12/10/2022 9:30 A M Medical Record Number: [id number] Patient Account Number: [id number] Date of Birth/Sex: Treating RN: May 19, 1955 (67 y.o. 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 F) Primary Care Provider: [name] Other Clinician: Referring Provider: Treating Provider/Extender: Jasmine Pang in Treatment: 14 Subjective Chief Complaint Information obtained from Patient 04/19/2021: The patient is here for ongoing follow-up regarding 2 left lower extremity wounds. 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 She tells [name] that she fell on the gravel driveway in July. She developed injuries on her distal lower leg which have not healed. She saw her primary physician on 11/15/2017 who noted her left shin injuries. Gave her antibiotics. At that point the wounds were almost circumferential however most were less than 1.5 cm. Weeping edema fluid was noted. She was referred here for evaluation. The patient has a history of chronic lower extremity edema. 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 She has had prior history with leg weeping fluid. She does not wear compression stockings. She is not doing anything specific to these wound areas. 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 She also has lymphedema. Put her in 3 layer compression and silver alginate last week. She has 3 small wounds with depth just lateral to the tibia. More problematically than this she has numerous shallow areas some of which are almost canal like in shape with tightly  bit of eschar and slough present. Edema control is good. Electronic Signature(s) Signed: 12/10/2022 10:33:56 AM By: [name] MD FACS Entered By: [name] on 12/10/2022 10:33:55 -------------------------------------------------------------------------------- Physical Exam Details Patient Name: Date of Service: [name] [name]. 12/10/2022 9:30 A M Medical Record Number: [id number] Patient Account Number: [id number] Date of Birth/Sex: Treating RN: February 15, 1955 (67 y.o. F) Primary Care Provider: [name] Other Clinician: Referring Provider: Treating Provider/Extender: Jasmine Pang in Treatment: 14 Constitutional Slightly hypertensive. . . . no acute distress. Respiratory Normal work of breathing on room air.. Notes 12/10/2022: The wound on her leg remains unchanged. There is a little bit of eschar and slough present. Edema control is good. Electronic Signature(s) Signed: 12/10/2022 10:34:33 AM By: [name] MD FACS Entered By: [name] on 12/10/2022 10:34:33 KARSON, SLATER D ([id number])  [id number] Page 6 of 15 -------------------------------------------------------------------------------- Physician Orders Details Patient Name: Date of Service: [name], Erin 12/10/2022 9:30 A M Medical Record Number: [id number] Patient Account Number: [id number] Date of Birth/Sex: Treating RN: 02/14/1955 (67 y.o. 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 - Dr. Lady Gary 12/17/22 at 9:30am Return A Room 3 ppointment in 2 weeks. - Please ask front desk for appointments Return A Discharge From [facility] Discharge from [facility] - Please keep wearing compression stockings. Anesthetic (In clinic) Topical Lidocaine 4% applied to wound bed Bathing/ Shower/ Hygiene May shower with protection but do not get wound dressing(s) wet. Protect dressing(s) with water repellant cover (for example, large plastic bag) or a cast cover and may then take shower. Lymphedema Treatment Plan - Exercise, Compression and Elevation Left Lower Extremity Other: - Jobst Farrow wraps ordered-Pt. has this garment. Wound Treatment Wound #10 - Abdomen - Lower Quadrant Wound Laterality: Left Cleanser: Soap and Water 1 x Per Day/30 Days Discharge Instructions: May shower and wash wound with dial antibacterial soap and water prior to dressing change. Cleanser: Vashe 5.8 (oz) 1 x Per Day/30 Days Discharge Instructions: Cleanse the wound with Vashe prior to applying a clean dressing using gauze sponges, not tissue or cotton balls. Cleanser:  Wound Cleanser 1 x Per Day/30 Days Discharge Instructions: Cleanse the wound with wound cleanser prior to applying a clean dressing using gauze sponges, not tissue or cotton balls. Prim Dressing: Maxorb Extra Ag+ Alginate Dressing, 2x2 (in/in) 1 x Per Day/30 Days ary Discharge Instructions: Apply to wound bed as instructed Secondary Dressing: ABD Pad, 8x10 1 x Per Day/30 Days Discharge Instructions: Apply over primary dressing as directed. 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 Wound #9 - Lower Leg Wound Laterality: Left, Anterior, Distal Cleanser: Vashe 5.8 (oz) 1 x Per Week/30 Days Discharge Instructions: Cleanse the wound with Vashe prior to applying a clean dressing using gauze sponges, not tissue or cotton balls. Topical: Gentamicin 1 x Per Week/30 Days Discharge Instructions: As directed by physician Topical: Mupirocin Ointment 1 x Per Week/30 Days Discharge Instructions: Apply Mupirocin (Bactroban) as instructed Prim Dressing: Endoform 2x2 in 1 x Per Week/30 Days ary Discharge Instructions: Moisten with saline DILYS, MINTURN ([id number])  10:35:49 -------------------------------------------------------------------------------- HxROS Details Patient Name: Date of Service: CLAUDINE, [name] 12/10/2022 9:30 A M Medical Record Number: [id number] Patient Account Number: [id number] Date of Birth/Sex: Treating RN: Dec 15, 1955 (67 y.o. F) Primary Care Provider: [name] Other Clinician: Referring Provider: Treating Provider/Extender: Jasmine Pang in Treatment: 14 Information Obtained From Patient Constitutional Symptoms (General Health) Medical History: Past Medical History Notes: morbid obesity Eyes Medical History: Positive for: Cataracts Hematologic/Lymphatic Medical History: Positive for: Lymphedema Cardiovascular Medical History: Positive for: Hypertension; Peripheral Venous Disease Past Medical History Notes: schamberg disease, hyperlipidemia Gastrointestinal Medical History: Past Medical History Notes: diverticulitis , h/o obstruction due to diverticulitis , colostomy and colostomy reversal Endocrine Medical History: Past Medical History Notes: hypothyroidism LAURAANNE, EYMARD ([id number]) [id number] Page 14 of 15 Integumentary (Skin) Medical History: Negative for: History of Burn Musculoskeletal Medical History: Positive for: Osteoarthritis HBO Extended History Items Eyes: Cataracts Immunizations Pneumococcal Vaccine: Received Pneumococcal Vaccination: Yes Received Pneumococcal Vaccination On or After 60th Birthday: Yes Implantable Devices None Hospitalization / Surgery History Type of Hospitalization/Surgery colostomy reversal 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 Family and Social History Cancer: No;  Diabetes: No; Heart Disease: Yes - Mother,Father; Hereditary Spherocytosis: No; Hypertension: Yes - Mother,Father; Kidney Disease: Yes - Mother; Lung Disease: No; Seizures: No; Stroke: No; Thyroid Problems: Yes - Mother; Tuberculosis: No; Never smoker; Marital Status - Single; Alcohol Use: Never; Drug Use: No History; Caffeine Use: Daily - coffee; Financial Concerns: No; Food, Clothing or Shelter Needs: No; Support System Lacking: No; Transportation Concerns: No Electronic Signature(s) Signed: 12/10/2022 10:36:36 AM By: [name] MD FACS Entered By: [name] on 12/10/2022 10:34:04 -------------------------------------------------------------------------------- SuperBill Details Patient Name: Date of Service: [name] 12/10/2022 Medical Record Number: [id number] Patient Account Number: [id number] Date of Birth/Sex: Treating RN: [birth year] (67 y.o. 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 Edema control is good. Electronic Signature(s) Signed: 12/10/2022 10:33:56 AM By: [name] MD FACS Entered By: [name] on 12/10/2022 10:33:55 -------------------------------------------------------------------------------- Physical Exam Details Patient Name: Date of Service: [name] [name]. 12/10/2022 9:30 A M Medical Record Number: [id number] Patient Account Number: [id number] Date of Birth/Sex: Treating RN: February 15, 1955 (67 y.o. F) Primary Care Provider: [name] Other Clinician: Referring Provider: Treating Provider/Extender: Jasmine Pang in Treatment: 14 Constitutional Slightly hypertensive. . . . no acute distress. Respiratory Normal work of breathing on room air.. Notes 12/10/2022: The wound on her leg remains unchanged. There is a little bit of eschar and slough present. Edema control is good. Electronic Signature(s) Signed: 12/10/2022 10:34:33 AM By: [name] MD FACS Entered By: [name] on 12/10/2022 10:34:33 KARSON, SLATER D ([id number])  [id number] Page 6 of 15 -------------------------------------------------------------------------------- Physician Orders Details Patient Name: Date of Service: [name], Erin 12/10/2022 9:30 A M Medical Record Number: [id number] Patient Account Number: [id number] Date of Birth/Sex: Treating RN: 02/14/1955 (67 y.o. 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 - Dr. Lady Gary 12/17/22 at 9:30am Return A Room 3 ppointment in 2 weeks. - Please ask front desk for appointments Return A Discharge From [facility] Discharge from [facility] Center - Please keep wearing compression stockings. Anesthetic (In clinic) Topical Lidocaine 4% applied to wound bed Bathing/ Shower/ Hygiene May shower with protection but do not get wound dressing(s) wet. Protect dressing(s) with water repellant cover (for example, large plastic bag) or a cast cover and may then take shower. Lymphedema Treatment Plan - Exercise, Compression and Elevation Left Lower Extremity Other: - Jobst Farrow wraps ordered-Pt. has this garment. Wound Treatment Wound #10 - Abdomen - Lower Quadrant Wound Laterality: Left Cleanser: Soap and Water 1 x Per Day/30 Days Discharge Instructions: May shower and wash wound with dial antibacterial soap and water prior to dressing change. Cleanser: Vashe 5.8 (oz) 1 x Per Day/30 Days Discharge Instructions: Cleanse the wound with Vashe prior to applying a clean dressing using gauze sponges, not tissue or cotton balls. Cleanser:  Wound Cleanser 1 x Per Day/30 Days Discharge Instructions: Cleanse the wound with wound cleanser prior to applying a clean dressing using gauze sponges, not tissue or cotton balls. Prim Dressing: Maxorb Extra Ag+ Alginate Dressing, 2x2 (in/in) 1 x Per Day/30 Days ary Discharge Instructions: Apply to wound bed as instructed Secondary Dressing: ABD Pad, 8x10 1 x Per Day/30 Days Discharge Instructions: Apply over primary dressing as directed. 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 Wound #9 - Lower Leg Wound Laterality: Left, Anterior, Distal Cleanser: Vashe 5.8 (oz) 1 x Per Week/30 Days Discharge Instructions: Cleanse the wound with Vashe prior to applying a clean dressing using gauze sponges, not tissue or cotton balls. Topical: Gentamicin 1 x Per Week/30 Days Discharge Instructions: As directed by physician Topical: Mupirocin Ointment 1 x Per Week/30 Days Discharge Instructions: Apply Mupirocin (Bactroban) as instructed Prim Dressing: Endoform 2x2 in 1 x Per Week/30 Days ary Discharge Instructions: Moisten with saline DILYS, MINTURN ([id number])  [phone] [phone].pdf Page 7 of 15 Secondary Dressing: ABD Pad, 8x10 1 x Per Week/30 Days Discharge Instructions: Apply over primary dressing as directed. Compression Wrap: FourPress (4 layer compression wrap) 1 x Per Week/30 Days Discharge Instructions: Apply four layer compression as directed. May also use Urgo K2 compression system as alternative. Compression Wrap: Unnaboot w/Calamine, 4x10 (in/yd) 1 x Per Week/30 Days Discharge Instructions: Apply Unnaboot as directed. Compression Stockings: Jobst Farrow Wrap 4000 Left Leg Compression Amount: 30-40 mmHG Discharge Instructions: Apply Renee Pain daily as instructed. Apply first thing in the morning, remove at night before bed. Electronic Signature(s) Signed: 12/10/2022  10:36:36 AM By: [name] MD FACS Entered By: [name] on 12/10/2022 10:35:04 -------------------------------------------------------------------------------- Problem List Details Patient Name: Date of Service: [name] [name]. 12/10/2022 9:30 A M Medical Record Number: [id number] Patient Account Number: [id number] Date of Birth/Sex: Treating RN: May 19, 1955 (67 y.o. 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 F) Primary Care Provider: [name] Other Clinician: Referring Provider: Treating Provider/Extender: Jasmine Pang in Treatment: 14 Subjective Chief Complaint Information obtained from Patient 04/19/2021: The patient is here for ongoing follow-up regarding 2 left lower extremity wounds. 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 She tells [name] that she fell on the gravel driveway in July. She developed injuries on her distal lower leg which have not healed. She saw her primary physician on 11/15/2017 who noted her left shin injuries. Gave her antibiotics. At that point the wounds were almost circumferential however most were less than 1.5 cm. Weeping edema fluid was noted. She was referred here for evaluation. The patient has a history of chronic lower extremity edema. 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 She has had prior history with leg weeping fluid. She does not wear compression stockings. She is not doing anything specific to these wound areas. 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 She also has lymphedema. Put her in 3 layer compression and silver alginate last week. She has 3 small wounds with depth just lateral to the tibia. More problematically than this she has numerous shallow areas some of which are almost canal like in shape with tightly  [phone].pdf Page 7 of 15 Secondary Dressing: ABD Pad, 8x10 1 x Per Week/30 Days Discharge Instructions: Apply over primary dressing as directed. Compression Wrap: FourPress (4 layer compression wrap) 1 x Per Week/30 Days Discharge Instructions: Apply four layer compression as directed. May also use Urgo K2 compression system as alternative. Compression Wrap: Unnaboot w/Calamine, 4x10 (in/yd) 1 x Per Week/30 Days Discharge Instructions: Apply Unnaboot as directed. Compression Stockings: Jobst Farrow Wrap 4000 Left Leg Compression Amount: 30-40 mmHG Discharge Instructions: Apply Renee Pain daily as instructed. Apply first thing in the morning, remove at night before bed. Electronic Signature(s) Signed: 12/10/2022  10:36:36 AM By: [name] MD FACS Entered By: [name] on 12/10/2022 10:35:04 -------------------------------------------------------------------------------- Problem List Details Patient Name: Date of Service: [name] [name]. 12/10/2022 9:30 A M Medical Record Number: [id number] Patient Account Number: [id number] Date of Birth/Sex: Treating RN: May 19, 1955 (67 y.o. 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 F) Primary Care Provider: [name] Other Clinician: Referring Provider: Treating Provider/Extender: Jasmine Pang in Treatment: 14 Subjective Chief Complaint Information obtained from Patient 04/19/2021: The patient is here for ongoing follow-up regarding 2 left lower extremity wounds. 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 She tells [name] that she fell on the gravel driveway in July. She developed injuries on her distal lower leg which have not healed. She saw her primary physician on 11/15/2017 who noted her left shin injuries. Gave her antibiotics. At that point the wounds were almost circumferential however most were less than 1.5 cm. Weeping edema fluid was noted. She was referred here for evaluation. The patient has a history of chronic lower extremity edema. 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 She has had prior history with leg weeping fluid. She does not wear compression stockings. She is not doing anything specific to these wound areas. 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 She also has lymphedema. Put her in 3 layer compression and silver alginate last week. She has 3 small wounds with depth just lateral to the tibia. More problematically than this she has numerous shallow areas some of which are almost canal like in shape with tightly  10:35:49 -------------------------------------------------------------------------------- HxROS Details Patient Name: Date of Service: CLAUDINE, [name] 12/10/2022 9:30 A M Medical Record Number: [id number] Patient Account Number: [id number] Date of Birth/Sex: Treating RN: Dec 15, 1955 (67 y.o. F) Primary Care Provider: [name] Other Clinician: Referring Provider: Treating Provider/Extender: Jasmine Pang in Treatment: 14 Information Obtained From Patient Constitutional Symptoms (General Health) Medical History: Past Medical History Notes: morbid obesity Eyes Medical History: Positive for: Cataracts Hematologic/Lymphatic Medical History: Positive for: Lymphedema Cardiovascular Medical History: Positive for: Hypertension; Peripheral Venous Disease Past Medical History Notes: schamberg disease, hyperlipidemia Gastrointestinal Medical History: Past Medical History Notes: diverticulitis , h/o obstruction due to diverticulitis , colostomy and colostomy reversal Endocrine Medical History: Past Medical History Notes: hypothyroidism LAURAANNE, EYMARD ([id number]) [id number] Page 14 of 15 Integumentary (Skin) Medical History: Negative for: History of Burn Musculoskeletal Medical History: Positive for: Osteoarthritis HBO Extended History Items Eyes: Cataracts Immunizations Pneumococcal Vaccine: Received Pneumococcal Vaccination: Yes Received Pneumococcal Vaccination On or After 60th Birthday: Yes Implantable Devices None Hospitalization / Surgery History Type of Hospitalization/Surgery colostomy reversal 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 Family and Social History Cancer: No;  Diabetes: No; Heart Disease: Yes - Mother,Father; Hereditary Spherocytosis: No; Hypertension: Yes - Mother,Father; Kidney Disease: Yes - Mother; Lung Disease: No; Seizures: No; Stroke: No; Thyroid Problems: Yes - Mother; Tuberculosis: No; Never smoker; Marital Status - Single; Alcohol Use: Never; Drug Use: No History; Caffeine Use: Daily - coffee; Financial Concerns: No; Food, Clothing or Shelter Needs: No; Support System Lacking: No; Transportation Concerns: No Electronic Signature(s) Signed: 12/10/2022 10:36:36 AM By: [name] MD FACS Entered By: [name] on 12/10/2022 10:34:04 -------------------------------------------------------------------------------- SuperBill Details Patient Name: Date of Service: [name] 12/10/2022 Medical Record Number: [id number] Patient Account Number: [id number] Date of Birth/Sex: Treating RN: [birth year] (67 y.o. 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 F) Primary Care Provider: [name] Other Clinician: Referring Provider: Treating Provider/Extender: Jasmine Pang in Treatment: 14 Information Obtained From Patient Constitutional Symptoms (General Health) Medical History: Past Medical History Notes: morbid obesity Eyes Medical History: Positive for: Cataracts Hematologic/Lymphatic Medical History: Positive for: Lymphedema Cardiovascular Medical History: Positive for: Hypertension; Peripheral Venous Disease Past Medical History Notes: schamberg disease, hyperlipidemia Gastrointestinal Medical History: Past Medical History Notes: diverticulitis , h/o obstruction due to diverticulitis , colostomy and colostomy reversal Endocrine Medical History: Past Medical History Notes: hypothyroidism LAURAANNE, EYMARD ([id number]) [id number] Page 14 of 15 Integumentary (Skin) Medical History: Negative for: History of Burn Musculoskeletal Medical History: Positive for: Osteoarthritis HBO Extended History Items Eyes: Cataracts Immunizations Pneumococcal Vaccine: Received Pneumococcal Vaccination: Yes Received Pneumococcal Vaccination On or After 60th Birthday: Yes Implantable Devices None Hospitalization / Surgery History Type of Hospitalization/Surgery colostomy reversal 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 Family and Social History Cancer: No;  Diabetes: No; Heart Disease: Yes - Mother,Father; Hereditary Spherocytosis: No; Hypertension: Yes - Mother,Father; Kidney Disease: Yes - Mother; Lung Disease: No; Seizures: No; Stroke: No; Thyroid Problems: Yes - Mother; Tuberculosis: No; Never smoker; Marital Status - Single; Alcohol Use: Never; Drug Use: No History; Caffeine Use: Daily - coffee; Financial Concerns: No; Food, Clothing or Shelter Needs: No; Support System Lacking: No; Transportation Concerns: No Electronic Signature(s) Signed: 12/10/2022 10:36:36 AM By: [name] MD FACS Entered By: [name] on 12/10/2022 10:34:04 -------------------------------------------------------------------------------- SuperBill Details Patient Name: Date of Service: [name] 12/10/2022 Medical Record Number: [id number] Patient Account Number: [id number] Date of Birth/Sex: Treating RN: [birth year] (67 y.o. 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 Edema control is good. Electronic Signature(s) Signed: 12/10/2022 10:33:56 AM By: [name] MD FACS Entered By: [name] on 12/10/2022 10:33:55 -------------------------------------------------------------------------------- Physical Exam Details Patient Name: Date of Service: [name] [name]. 12/10/2022 9:30 A M Medical Record Number: [id number] Patient Account Number: [id number] Date of Birth/Sex: Treating RN: February 15, 1955 (67 y.o. F) Primary Care Provider: [name] Other Clinician: Referring Provider: Treating Provider/Extender: Jasmine Pang in Treatment: 14 Constitutional Slightly hypertensive. . . . no acute distress. Respiratory Normal work of breathing on room air.. Notes 12/10/2022: The wound on her leg remains unchanged. There is a little bit of eschar and slough present. Edema control is good. Electronic Signature(s) Signed: 12/10/2022 10:34:33 AM By: [name] MD FACS Entered By: [name] on 12/10/2022 10:34:33 KARSON, SLATER D ([id number])  [id number] Page 6 of 15 -------------------------------------------------------------------------------- Physician Orders Details Patient Name: Date of Service: [name], Erin 12/10/2022 9:30 A M Medical Record Number: [id number] Patient Account Number: [id number] Date of Birth/Sex: Treating RN: 02/14/1955 (67 y.o. 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 - Dr. Lady Gary 12/17/22 at 9:30am Return A Room 3 ppointment in 2 weeks. - Please ask front desk for appointments Return A Discharge From [facility] Discharge from [facility] - Please keep wearing compression stockings. Anesthetic (In clinic) Topical Lidocaine 4% applied to wound bed Bathing/ Shower/ Hygiene May shower with protection but do not get wound dressing(s) wet. Protect dressing(s) with water repellant cover (for example, large plastic bag) or a cast cover and may then take shower. Lymphedema Treatment Plan - Exercise, Compression and Elevation Left Lower Extremity Other: - Jobst Farrow wraps ordered-Pt. has this garment. Wound Treatment Wound #10 - Abdomen - Lower Quadrant Wound Laterality: Left Cleanser: Soap and Water 1 x Per Day/30 Days Discharge Instructions: May shower and wash wound with dial antibacterial soap and water prior to dressing change. Cleanser: Vashe 5.8 (oz) 1 x Per Day/30 Days Discharge Instructions: Cleanse the wound with Vashe prior to applying a clean dressing using gauze sponges, not tissue or cotton balls. Cleanser:  Wound Cleanser 1 x Per Day/30 Days Discharge Instructions: Cleanse the wound with wound cleanser prior to applying a clean dressing using gauze sponges, not tissue or cotton balls. Prim Dressing: Maxorb Extra Ag+ Alginate Dressing, 2x2 (in/in) 1 x Per Day/30 Days ary Discharge Instructions: Apply to wound bed as instructed Secondary Dressing: ABD Pad, 8x10 1 x Per Day/30 Days Discharge Instructions: Apply over primary dressing as directed. 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 Wound #9 - Lower Leg Wound Laterality: Left, Anterior, Distal Cleanser: Vashe 5.8 (oz) 1 x Per Week/30 Days Discharge Instructions: Cleanse the wound with Vashe prior to applying a clean dressing using gauze sponges, not tissue or cotton balls. Topical: Gentamicin 1 x Per Week/30 Days Discharge Instructions: As directed by physician Topical: Mupirocin Ointment 1 x Per Week/30 Days Discharge Instructions: Apply Mupirocin (Bactroban) as instructed Prim Dressing: Endoform 2x2 in 1 x Per Week/30 Days ary Discharge Instructions: Moisten with saline DILYS, MINTURN ([id number])  10:35:49 -------------------------------------------------------------------------------- HxROS Details Patient Name: Date of Service: CLAUDINE, [name] 12/10/2022 9:30 A M Medical Record Number: [id number] Patient Account Number: [id number] Date of Birth/Sex: Treating RN: Dec 15, 1955 (67 y.o. F) Primary Care Provider: [name] Other Clinician: Referring Provider: Treating Provider/Extender: Jasmine Pang in Treatment: 14 Information Obtained From Patient Constitutional Symptoms (General Health) Medical History: Past Medical History Notes: morbid obesity Eyes Medical History: Positive for: Cataracts Hematologic/Lymphatic Medical History: Positive for: Lymphedema Cardiovascular Medical History: Positive for: Hypertension; Peripheral Venous Disease Past Medical History Notes: schamberg disease, hyperlipidemia Gastrointestinal Medical History: Past Medical History Notes: diverticulitis , h/o obstruction due to diverticulitis , colostomy and colostomy reversal Endocrine Medical History: Past Medical History Notes: hypothyroidism LAURAANNE, EYMARD ([id number]) [id number] Page 14 of 15 Integumentary (Skin) Medical History: Negative for: History of Burn Musculoskeletal Medical History: Positive for: Osteoarthritis HBO Extended History Items Eyes: Cataracts Immunizations Pneumococcal Vaccine: Received Pneumococcal Vaccination: Yes Received Pneumococcal Vaccination On or After 60th Birthday: Yes Implantable Devices None Hospitalization / Surgery History Type of Hospitalization/Surgery colostomy reversal 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 Family and Social History Cancer: No;  Diabetes: No; Heart Disease: Yes - Mother,Father; Hereditary Spherocytosis: No; Hypertension: Yes - Mother,Father; Kidney Disease: Yes - Mother; Lung Disease: No; Seizures: No; Stroke: No; Thyroid Problems: Yes - Mother; Tuberculosis: No; Never smoker; Marital Status - Single; Alcohol Use: Never; Drug Use: No History; Caffeine Use: Daily - coffee; Financial Concerns: No; Food, Clothing or Shelter Needs: No; Support System Lacking: No; Transportation Concerns: No Electronic Signature(s) Signed: 12/10/2022 10:36:36 AM By: [name] MD FACS Entered By: [name] on 12/10/2022 10:34:04 -------------------------------------------------------------------------------- SuperBill Details Patient Name: Date of Service: [name] 12/10/2022 Medical Record Number: [id number] Patient Account Number: [id number] Date of Birth/Sex: Treating RN: [birth year] (67 y.o. 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 F) Primary Care Provider: [name] Other Clinician: Referring Provider: Treating Provider/Extender: Jasmine Pang in Treatment: 14 Information Obtained from: Patient Chief Complaint 04/19/2021: The patient is here for ongoing follow-up regarding 2 left lower extremity wounds. 01/22/2022; patient returns to clinic with a wound on the left anterior lower leg secondary to trauma Electronic Signature(s) Signed: 12/10/2022 10:33:15 AM By: [name] MD FACS Entered By: [name] on 12/10/2022 10:33:15 -------------------------------------------------------------------------------- Debridement Details Patient Name: Date of Service: [name] [name]. 12/10/2022 9:30 A M Medical Record Number: [id number] Patient Account Number: [id number] Date of Birth/Sex: Treating RN: Jul 12, 1955 (67 y.o. [name] Primary Care Provider: [name] Other Clinician: Referring Provider: Treating Provider/Extender: Jasmine Pang in Treatment: 14 Debridement Performed for Assessment: Wound #9 Left,Distal,Anterior Lower Leg Performed By: Physician [name], MD The following information was scribed by: Karie Schwalbe The information was scribed for: [name] Debridement Type: Debridement Level of Consciousness (Pre-procedure): Awake and Alert Pre-procedure Verification/Time Out Yes - 10:24 Taken: Start Time: 10:24 Pain Control: Lidocaine 4% T opical Solution Percent of Wound Bed Debrided: 100% T Area Debrided (cm): otal 0.01 Tissue and other material debrided: Viable, Non-Viable,  Slough, Skin: Dermis , Skin: Epidermis, Slough Level: Skin/Epidermis Debridement Description: Selective/Open Wound Instrument: Curette Bleeding: Minimum Hemostasis Achieved: Pressure End Time: 10:25 Procedural Pain: 0 Post Procedural Pain: 0 Response to Treatment: Procedure was tolerated well Level of Consciousness (Post- Awake and Alert procedure): Post Debridement Measurements of Total Wound Length: (cm) 0.1 Width: (cm) 0.1 Depth: (cm) 0.1 ZYANYA, WORMALD D ([id number]) [id number] Page 2 of 15 Volume: (cm) 0.001 Character of Wound/Ulcer Post Debridement: Improved Post Procedure Diagnosis Same as Pre-procedure Electronic Signature(s) Signed: 12/10/2022 10:36:36 AM By: [name] MD FACS Signed: 12/11/2022 6:03:53 PM By: Karie Schwalbe RN Entered By: Karie Schwalbe on 12/10/2022 10:28:02 -------------------------------------------------------------------------------- HPI Details Patient Name: Date of Service: [name] 12/10/2022 9:30 A M Medical Record Number: [id number] Patient Account Number: [id number] Date of Birth/Sex: Treating RN: Jul 13, 1955 (67 y.o. 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 She tells [name] that she fell on the gravel driveway in July. She developed injuries on her distal lower leg which have not healed. She saw her primary physician on 11/15/2017 who noted her left shin injuries. Gave her antibiotics. At that point the wounds were almost circumferential however most were less than 1.5 cm. Weeping edema fluid was noted. She was referred here for evaluation. The patient has a history of chronic lower extremity edema. 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 She has had prior history with leg weeping fluid. She does not wear compression stockings. She is not doing anything specific to these wound areas. 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 She has chronic  bit of eschar and slough present. Edema control is good. Electronic Signature(s) Signed: 12/10/2022 10:33:56 AM By: [name] MD FACS Entered By: [name] on 12/10/2022 10:33:55 -------------------------------------------------------------------------------- Physical Exam Details Patient Name: Date of Service: [name] [name]. 12/10/2022 9:30 A M Medical Record Number: [id number] Patient Account Number: [id number] Date of Birth/Sex: Treating RN: February 15, 1955 (67 y.o. F) Primary Care Provider: [name] Other Clinician: Referring Provider: Treating Provider/Extender: Jasmine Pang in Treatment: 14 Constitutional Slightly hypertensive. . . . no acute distress. Respiratory Normal work of breathing on room air.. Notes 12/10/2022: The wound on her leg remains unchanged. There is a little bit of eschar and slough present. Edema control is good. Electronic Signature(s) Signed: 12/10/2022 10:34:33 AM By: [name] MD FACS Entered By: [name] on 12/10/2022 10:34:33 KARSON, SLATER D ([id number])  [id number] Page 6 of 15 -------------------------------------------------------------------------------- Physician Orders Details Patient Name: Date of Service: [name], Erin 12/10/2022 9:30 A M Medical Record Number: [id number] Patient Account Number: [id number] Date of Birth/Sex: Treating RN: 02/14/1955 (67 y.o. 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 - Dr. Lady Gary 12/17/22 at 9:30am Return A Room 3 ppointment in 2 weeks. - Please ask front desk for appointments Return A Discharge From [facility] Discharge from Wound Care Center - Please keep wearing compression stockings. Anesthetic (In clinic) Topical Lidocaine 4% applied to wound bed Bathing/ Shower/ Hygiene May shower with protection but do not get wound dressing(s) wet. Protect dressing(s) with water repellant cover (for example, large plastic bag) or a cast cover and may then take shower. Lymphedema Treatment Plan - Exercise, Compression and Elevation Left Lower Extremity Other: - Jobst Farrow wraps ordered-Pt. has this garment. Wound Treatment Wound #10 - Abdomen - Lower Quadrant Wound Laterality: Left Cleanser: Soap and Water 1 x Per Day/30 Days Discharge Instructions: May shower and wash wound with dial antibacterial soap and water prior to dressing change. Cleanser: Vashe 5.8 (oz) 1 x Per Day/30 Days Discharge Instructions: Cleanse the wound with Vashe prior to applying a clean dressing using gauze sponges, not tissue or cotton balls. Cleanser:  Wound Cleanser 1 x Per Day/30 Days Discharge Instructions: Cleanse the wound with wound cleanser prior to applying a clean dressing using gauze sponges, not tissue or cotton balls. Prim Dressing: Maxorb Extra Ag+ Alginate Dressing, 2x2 (in/in) 1 x Per Day/30 Days ary Discharge Instructions: Apply to wound bed as instructed Secondary Dressing: ABD Pad, 8x10 1 x Per Day/30 Days Discharge Instructions: Apply over primary dressing as directed. 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 Wound #9 - Lower Leg Wound Laterality: Left, Anterior, Distal Cleanser: Vashe 5.8 (oz) 1 x Per Week/30 Days Discharge Instructions: Cleanse the wound with Vashe prior to applying a clean dressing using gauze sponges, not tissue or cotton balls. Topical: Gentamicin 1 x Per Week/30 Days Discharge Instructions: As directed by physician Topical: Mupirocin Ointment 1 x Per Week/30 Days Discharge Instructions: Apply Mupirocin (Bactroban) as instructed Prim Dressing: Endoform 2x2 in 1 x Per Week/30 Days ary Discharge Instructions: Moisten with saline DILYS, MINTURN ([id number])

## 2022-12-17 ENCOUNTER — Encounter (HOSPITAL_BASED_OUTPATIENT_CLINIC_OR_DEPARTMENT_OTHER): Payer: Commercial Managed Care - PPO | Attending: General Surgery | Admitting: General Surgery

## 2022-12-17 DIAGNOSIS — I87312 Chronic venous hypertension (idiopathic) with ulcer of left lower extremity: Secondary | ICD-10-CM | POA: Insufficient documentation

## 2022-12-17 DIAGNOSIS — I10 Essential (primary) hypertension: Secondary | ICD-10-CM | POA: Insufficient documentation

## 2022-12-17 DIAGNOSIS — L97928 Non-pressure chronic ulcer of unspecified part of left lower leg with other specified severity: Secondary | ICD-10-CM | POA: Diagnosis not present

## 2022-12-17 DIAGNOSIS — I890 Lymphedema, not elsewhere classified: Secondary | ICD-10-CM | POA: Diagnosis not present

## 2022-12-17 NOTE — Progress Notes (Signed)
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 the surface. The old ostomy site still has hypertrophic granulation tissue present. She is awaiting approval from insurance to see Dr. Maisie Fus, the surgeon that performed her ostomy reversal. 10/22/2022: The leg wound continues to contract. There is minimal slough on the surface. Edema control is good. The old ostomy site looks about the same, but the hypertrophic granulation tissue is less prominent today. 10/29/2022: She has a new leg wound just proximal and lateral to the existing site. There is a little bit of slough on the surface. It looks as though perhaps some skin tore here. The existing leg wound is more superficial and also has a layer of slough on the surface. The old ostomy site is stable with some slough present. She still does not have an appointment with surgery. 11/05/2022: The satellite wound that was new last week is closed. The initial leg wound is smaller and more superficial with minimal slough on the surface. The old ostomy site is stable with some slough present. Apparently the patient has been waiting on her PCP to make a referral to surgery, rather than contacting them herself, even though she has been an  established patient. 11/12/2022: The leg wound continues to be more superficial. There is some eschar that has built up around the edges. The old ostomy site is stable. She has an appointment with general surgery on October 8. 11/19/2022: The leg wound is nearly healed. There is thin slough on the surface. The old stoma site remains stable. She sees Dr. Maisie Fus tomorrow. 11/26/2022: The leg wound is down to just a very small pinhole opening under a layer of slough and eschar. The abdominal wound is stable. She has surgery coming up next month with Dr. Maisie Fus to address this. 12/03/2022: No significant change to the leg wound. There is a little bit of slough and eschar accumulation. Edema control is good. We are no longer managing the abdominal site. 12/10/2022: The wound on her leg remains unchanged. There is a little bit of eschar  and slough present. Edema control is good. 12/17/2022: Her leg wound is covered with a layer of eschar. Underneath, the wound remains open with a little bit of slough accumulation. Electronic Signature(s) Signed: 12/17/2022 10:06:56 AM By: [name] MD FACS Entered By: [name] on 12/17/2022 10:06:56 -------------------------------------------------------------------------------- Physical Exam Details Patient Name: Date of Service: [name] 12/17/2022 9:30 A M Medical Record Number: [id number] Patient Account Number: [id number] Date of Birth/Sex: Treating RN: 12-31-1955 (67 y.o. F) Primary Care Provider: [name] Other Clinician: Referring Provider: Treating Provider/Extender: Jasmine Pang in Treatment: 15 Constitutional Slightly hypertensive. . . . no acute distress. Respiratory Normal work of breathing on room air.. Notes 12/17/2022: Her leg wound is covered with a layer of eschar. Underneath, the wound remains open with a little bit of slough accumulation. Electronic Signature(s) Signed: 12/17/2022 10:07:29  AM By: [name] MD FACS [place], [place] [name] ([id number]) AM By: [name] MD FACS [phone].pdf Page 6 of 15 Signed: 12/17/2022 10:07:29 Entered By: [name] on 12/17/2022 10:07:29 -------------------------------------------------------------------------------- Physician Orders Details Patient Name: Date of Service: [name], [name] 12/17/2022 9:30 A M Medical Record Number: [id number] Patient Account Number: [id number] Date of Birth/Sex: Treating RN: [birth year] (67 y.o. 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 - Dr. Lady Gary 12/24/22 at 9:30am Return A Room 3 ppointment in 2 weeks. - Please ask front desk for appointments Return A Discharge From [facility] - Please keep wearing compression stockings. Anesthetic (In clinic) Topical Lidocaine 4% applied to wound bed Bathing/ Shower/ Hygiene May shower with protection but do not get wound dressing(s) wet. Protect dressing(s) with water repellant cover (for example, large plastic bag) or a cast cover and may then take shower. Lymphedema Treatment Plan - Exercise, Compression and Elevation Left Lower Extremity Other: - Jobst Farrow wraps ordered-Pt. has this garment. Wound Treatment Wound #10 - Abdomen - Lower Quadrant Wound Laterality: Left Cleanser: Soap and Water 1 x Per Day/30 Days Discharge Instructions: May shower and wash wound with dial antibacterial soap and water prior to dressing change. Cleanser: Vashe 5.8  (oz) 1 x Per Day/30 Days Discharge Instructions: Cleanse the wound with Vashe prior to applying a clean dressing using gauze sponges, not tissue or cotton balls. Cleanser: Wound Cleanser 1 x Per Day/30 Days Discharge Instructions: Cleanse the wound with wound cleanser prior to applying a clean dressing using gauze sponges, not tissue or cotton balls. Prim Dressing: Maxorb Extra Ag+ Alginate Dressing, 2x2 (in/in) 1 x Per Day/30 Days ary Discharge Instructions: Apply to wound bed as instructed Secondary Dressing: ABD Pad, 8x10 1 x Per Day/30 Days Discharge Instructions: Apply over primary dressing as directed. Secured With: 75M Medipore Scientist, research (life sciences) Surgical T 2x10 (in/yd) 1 x Per Day/30 Days ape Discharge Instructions: Secure with tape as directed. Wound #9 - Lower Leg Wound Laterality: Left, Anterior, Distal Cleanser: Vashe 5.8 (oz) 1 x Per Week/30 Days Discharge Instructions: Cleanse the wound with Vashe prior to applying a clean dressing using gauze sponges, not tissue or cotton balls. Topical: Gentamicin 1 x Per Week/30 Days Discharge Instructions: As directed by physician Topical: Mupirocin Ointment 1 x  Per Week/30 Days Discharge Instructions: Apply Mupirocin (Bactroban) as instructed [name], [name] ([id number]) [id number] Page 7 of 15 Prim Dressing: Endoform 2x2 in 1 x Per Week/30 Days ary Discharge Instructions: Moisten with saline Secondary Dressing: ABD Pad, 8x10 1 x Per Week/30 Days Discharge Instructions: Apply over primary dressing as directed. Compression Wrap: FourPress (4 layer compression wrap) 1 x Per Week/30 Days Discharge Instructions: Apply four layer compression as directed. May also use Urgo K2 compression system as alternative. Compression Wrap: Unnaboot w/Calamine, 4x10 (in/yd) 1 x Per Week/30 Days Discharge Instructions: Apply Unnaboot as directed. Compression Stockings: Jobst Farrow Wrap 4000 Left Leg Compression Amount: 30-40  mmHG Discharge Instructions: Apply Renee Pain daily as instructed. Apply first thing in the morning, remove at night before bed. Electronic Signature(s) Signed: 12/17/2022 12:35:03 PM By: [name] MD FACS Signed: 12/17/2022 3:38:48 PM By: Karie Schwalbe RN Entered By: Karie Schwalbe on 12/17/2022 10:11:15 -------------------------------------------------------------------------------- Problem List Details Patient Name: Date of Service: [name] 12/17/2022 9:30 A M Medical Record Number: [id number] Patient Account Number: [id number] Date of Birth/Sex: Treating RN: 1955/10/25 (67 y.o. 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 with other 08/31/2022 No Yes specified severity 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 08/31/2022 No Yes Inactive Problems ICD-10 Code Description Active Date Inactive Date L98.499 Non-pressure chronic ulcer of skin of other sites with unspecified severity 10/08/2022 10/08/2022 Resolved Problems Electronic Signature(s) Signed: 12/17/2022 10:05:50 AM By: [name] MD FACS Entered By: [name] on 12/17/2022 10:05:50 [name] ([id number]) [id number] Page 8 of 15 -------------------------------------------------------------------------------- Progress Note Details Patient Name: Date of Service: [name], [name] 12/17/2022 9:30 A M Medical Record Number: [id number] Patient Account Number: [id number] Date of Birth/Sex: Treating RN: 1955/05/19 (67 y.o. F) Primary Care Provider: [name] Other Clinician: Referring Provider: Treating Provider/Extender: Jasmine Pang in Treatment: 15 Subjective Chief Complaint Information obtained from Patient 04/19/2021: The patient is here for ongoing follow-up regarding 2 left lower extremity wounds. 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 She tells [name] that she fell on the gravel driveway in July. She developed injuries on her distal lower leg which have not healed. She saw her primary physician on 11/15/2017 who noted her left shin injuries. Gave her antibiotics. At that point the wounds were almost circumferential however most were less than 1.5 cm. Weeping edema fluid was noted. She was referred here for evaluation. The patient has a history of chronic lower extremity edema. 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 She has had prior history with leg weeping fluid. She does not wear compression stockings. She is not doing anything specific to these wound areas. 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 She also has lymphedema. Put her in 3 layer  compression and silver alginate last week. She has 3 small wounds with depth just lateral to the tibia. More problematically than this she has numerous shallow areas some of which are almost canal like in shape with tightly adherent painful debris. It would be very difficult and time- consuming to go through this and attempt to individually debride all these areas.. I changed her to collagen  today to see if that would help with any of the surface debris on some of these wounds. Otherwise we will not be able to put this in compression we have to have someone change the dressing. The patient is not eligible for home health 12/25/17 on evaluation today patient actually appears to be doing rather well in regard to the ulcer on her lower extremity. Fortunately there does not appear to be evidence of infection at this time. She has been tolerating the dressing changes without complication. This includes the compression wrap. The only issue she had was that the wrap was initially placed over her bunion region which actually calls her some discomfort and pain. Other than that things seem to be going rather well. 01/01/2018 Seen today for follow-up and management of left lower extremity wound and lymphedema. T oday she presents with a new wound towards to the left lateral LE. Recently treated with a 7 day course of amoxicillin; reason for antibiotic dose is unknown at this time. Tolerating current treatment of collagen with 4- layer wraps. She obtained a venous reflux study on 12/25/17. Studies show on the right abnormal reflux times of the popliteal vein, great saphenous vein at the saphenofemoral junction at the proximal thigh, great saphenous vein at the mid calf, and origin of the small saphenous vein.No superficial thrombosis. No deep vein thrombosis in the common femoral, femoral,and popliteal veins. Left abnormal reflex times as well of the common femoral vein, popliteal vein, and a great saphenous vein at the saphenofemoral junction, In great saphenous vein at the mid thigh w/o thrombosis. Has any issues or concerns during visit today. Recommended follow-up to vascular specialist due to abnormalities from the venous reflux study. Denies fever, pain, chills, dizziness, nausea, or vomiting. 01/08/18 upon evaluation today the patient actually seems to be showing some signs of improvement in  my opinion at this point in regard to the lower extremity ulcerated areas. She still has a lot of drainage but fortunately nothing that appears to be too significant currently. I have been very happy with the overall progress I see today compared to where things were during the last evaluation that I had with her. Nonetheless she has not had her appointment with the vein specialist as of yet in fact we were able to get this approved for her today and confirmed with them she will be seeing them on December 26. Nonetheless in general I do feel like the compression wraps is doing well for her. 01/17/18; quite a bit of improvement since last time I saw this patient she has a small open area remaining on the left lateral calf and even smaller area medially. She has lymphedema chronic stasis changes with distal skin fibrosis. She has an appointment with vascular surgery later this month 01/24/2018; the patient's medial leg has closed. Still a small open area on the left lateral leg. She states that the 4 layer compression we put on last week was too tight and she had to take it off over a few days ago. We have had resultant increase in her lymphedema in the dorsal foot  and a proximal calf. Fortunately that does not seem to have resulted in any deterioration in her wounds The patient is going to need compression stockings. We have given her measurements to phone elastic therapy in [place]. She has vascular surgery consult on December 26 02/07/18; she's had continuous contraction on the left lateral leg wound which is now very small. Currently using silver alginate under 3 layer compression She saw Dr. Myra Gianotti of vascular surgery on 02/06/18. It was noted that she had a normal reflux times in the popliteal vein, great saphenous vein at the saphenofemoral junction, great saphenous vein at the proximal thigh great saphenous vein at the mid calf and origin of the small saphenous vein. It was noted that she had  significant reflux in the left saphenous veins with diameter measurements in the 0.7-0.8 cm range. It was felt she would benefit from laser ablation to help minimize the risk of ulcer recurrence. It was recommended that she wear 20-30 thigh-high compression stockings and have follow-up in 4-6 weeks 02/17/2018; I thought this lady would be healed however her compression slipped down and she developed increasing swelling and the wound is actually larger. 1/13; we had deterioration last week after the patient's compression slipped down and she developed periwound swelling. I increased her compression before layers we have been using silver alginate we are a lot better again today. She has her compression stockings in waiting 1/23; the patient's wounds are totally healed today. She has her stockings. This was almost circumferential skin damage. She follows up with Dr. Myra Gianotti of vascular surgery next Monday. READMISSION 02/21/2021 This is a now 67 year old woman that we had in clinic here discharging in January 2020 with wounds on her left calf chronic venous insufficiency. She was [name], [name] ([id number]) [id number] Page 9 of 15 discharged with 30/40 stockings. It does not sound like she has worn stockings in about a year largely from not being able to get them on herself. In November she developed new blisters on her legs an area laterally is opened into a fairly sizable wound. She has weeping posteriorly as well. She has been using Neosporin and Band-Aids. The patient did see Dr. Myra Gianotti in 2019 and 2020. He felt she might benefit from laser ablation in her left saphenous veins although because of the wound that healed I do not think he went through with it. She might benefit from seeing him again. Her ABI on the left is 1. 1/17; patient's wound on the posterior left calf is closed she has the 2 large areas last week. We put her in 4-layer compression for edema control  is a lot better. Our intake nurse noted greenish drainage and odor. We have been using silver alginate 1/25; PCR culture I did have the substantial wound area on the left lateral lower leg showed staff aureus and group A strep. Low titers of coag negative staph which are probably skin contaminants. Resistance detected to tetracycline methicillin and macrolides. I gave her a starter kit of Nuzyra 150 mg x 3 for 2 days then 300 mg for a further 7 days. Marked odor considerable increase in surrounding erythema. We are using Iodoflex last week however I have changed to silver alginate with underlying Bactroban 2/1; she is completing her [place] tomorrow. The degree of erythema around the wounds looks a lot better. Odor has improved. I gave her silver alginate and Bactroban last week and changing her back to Iodoflex to continue with ongoing debridement 2/8; the periwound looks a  lot better. Surface of the wound also looks somewhat better although there is still ongoing debridement to be done we have been using Iodoflex under compression. Primary dressing and silver alginate 2/15; left posterior calf. Some improvement in the surface of the wound but still very gritty we have been using Iodoflex under compression. 04/05/2021: Left lateral posterior calf. She continues to have significant amounts of drainage, some of which is probably comprised of the Iodoflex microbleeds. There is no odor to the drainage. The satellite lesion on the more posterior aspect of the calf is epithelializing nicely and has contracted quite a bit. There is robust granulation tissue in the dominant wound with minimal adherent slough. 04/12/2021: The satellite lesion has nearly closed. She continues to have good granulation tissue with minimal slough of the larger primary wound. She continues to have a fair amount of drainage, but I think this is less secondary to switching her dressing from Iodoflex to Prisma. 04/19/2021: The satellite  lesion is almost completely epithelialized. The larger primary wound continues to contract with good granulation tissue and minimal slough. She is currently in [place] with compression. 04/26/2021: The satellite lesion has closed. The larger primary wound has contracted further and the granulation tissue is robust without being hypertrophic. Minimal slough is present. 05/03/2021: The satellite lesion remains closed. The larger primary wound has a bit of slough, but has also contracted further with good perimeter epithelialization. Good granulation tissue at the wound surface. There was some greenish drainage on the dressing when it was removed; it is not the blue-green typically associated with Pseudomonas aeruginosa, however. 05/10/2021: The primary wound continues to contract. There is minimal slough. The granulation tissue at 9:00 is a bit hypertrophic. No significant drainage. No odor. 05/17/2021: The wound is a little bit smaller today with good granulation tissue. Minimal slough. No significant drainage or odor. 05/24/2021: The wound continues to contract and has a nice base of granulation tissue. Small amount of slough. No concern for infection. 05/31/2021: For some reason, the wound measured slightly larger today but overall it still appears to be in good condition with a nice base of granulation tissue and minimal slough. 06/07/2021: The wound is smaller today. Good granulation tissue and minimal slough. 06/14/2021: The wound is unchanged in size. She continues to accumulate some slough. Good granulation tissue on the surface. 06/20/2021: The wound is smaller today. Minimal slough with good granulation tissue. 06/27/2021: The wound on her left lateral leg is smaller today with just a bit of slough and eschar accumulation. Unfortunately, she has opened a new superficial wound on her right lower extremity. She has 3+ pitting edema to the knees on that leg and she does not wear compression  stockings. 07/04/2021: In addition to the superficial wound on her right lower extremity that she opened up last week, she has opened 3 additional sites on the same leg. Her compression wraps were clearly not in correct position when she came to clinic today as they were at about the mid calf level rather than to the tibial tuberosity. She says that they slipped earlier and she had to come back on Friday to have them redone. The wound on her left lateral leg is perhaps slightly larger with some slough accumulation. 07/11/2021: The superficial wounds on her right lower extremity are nearly closed. They just have a thin layer of eschar overlying them. The wound on her left lateral leg is a little bit shallower and has a bit of slough accumulation. 07/17/2021: The right  medial lower extremity leg wounds have almost completely closed; one of them just has a tiny opening with a little bit of serous drainage. The left lateral leg wound is really unchanged. It seems to be stalled. 07/24/2021: The right leg wounds are completely closed. The left lateral leg wound is actually bigger today. It is a bit more tender. There is a minor accumulation of slough on the surface. 07/31/2021: The culture that I took last week was positive for MRSA. We added mupirocin under the silver alginate and I prescribed doxycycline. She has been tolerating this well. The wound is smaller today but does have slough accumulation. No significant pain or drainage. 08/07/2021: She completed her course of doxycycline. The wound looks much better today. It is smaller and the periwound is less inflamed. She does have some slough accumulation on the surface. 08/14/2021: The wound continues to contract. There is slough on the wound surface, but the periwound is intact without inflammation or induration. 08/21/2021: The wound is down to just 2 small open sites. They both have a bit of slough accumulation. 08/28/2021: The wound is down to just 1 small  open site with a little bit of slough and eschar accumulation. 09/04/2021: The wound continues to contract but remains open. It is clean without any slough. 09/12/2021: No real change in the overall wound dimensions, but it is flush with the surrounding skin. There is a little bit of slough accumulation. Edema control is good. 09/22/2021: The wound is smaller and even more superficial. Minimal slough accumulation. Good control of edema. [name], [name] ([id number]) [id number] Page 10 of 15 09/29/2021: The wound continues to contract. She reports that it was a little bit "twingey" during the week. It is a little bit dry on inspection today. Light accumulation of slough. Good edema control. 10/06/2021: The wound is about the same size today. There is a little slough on the surface. Edema control is good. 10/13/2021: The wound is about the same size but more superficial. A little bit of slough on the surface. 10/23/2021: The wound is slightly smaller and continues to fill in. Minimal slough on the wound surface. 10/30/2021: The wound continues to contract but has not yet closed. Light slough and eschar present. 11/06/2021: The wound persists, but seems a little bit more epithelialized. There is still slough and eschar accumulation. 11/14/2021: The wound continues to contract but persists. Light eschar around the wound. 11/22/2021: The wound is down to just a pinhole opening. There is eschar overlying the surface. Edema control is excellent. 11/29/2021: Her wound is closed. READMISSION 01/22/2022 This is a patient to be discharged in October. She has severe chronic venous insufficiency at that time she had a wound on her left lower leg posteriorly also the right leg at some point. These closed. We discharged her in 30/40 mm stockings from elastic therapy which she has been wearing religiously. She tells [name] that she was traumatized by a dolly while shopping at [place] about 2 to 3 weeks  ago. She has been left with a left anterior lower leg wound. She comes in in another pair of stockings other than her 3040s. She does not have an arterial issue with her last ABI in the left at 1.2 12/18; this is a patient who has chronic venous insufficiency and recurrent venous insufficiency ulcers. She has a area on her left anterior lower leg and we readmitted her to the clinic last week. We use silver alginate and 4-layer compression. ABI was 1.2 She brought  in her stocking which was a 20/30 below-knee stocking from elastic therapy. She may require a 30/40 mm equivalent stockings after this wound heals. 02/06/2022: The intake nurse reported an odor coming from the wound when she first unwrapped it but this abated after the leg was washed. There is thick layer of slough on the surface. 02/13/2022: The wound is cleaner today. It measured larger, but on visual inspection, I think some crust of Iodoflex was included in the wound measurements; the actual wound itself looks about the same to slightly smaller. Edema control is good. 02/20/2022: The wound is cleaner again today. It is also measuring a little bit smaller, but there has been some moisture related periwound breakdown. Edema control is good. 02/27/2022: The intake nurse reported that the wound measures larger, but some of this ended up being crusted on Iodoflex. There is still some periwound moisture. Still with thick slough accumulation. Edema control is good. 03/06/2022: The wound is cleaner and more superficial, but did measure larger because of the inclusion of a satellite area. Still with some slough accumulation but less so than on prior visits. 03/13/2022: The wound measured a little bit smaller today. There is slough and eschar accumulation, as per usual. 03/20/2022: Her wound is larger and deeper today. The entire surface appears nonviable. There is more periwound erythema and threatened tissue breakdown. 03/27/2022: The culture that I  took last week was positive for MRSA. We had empirically applied topical mupirocin to her wound. Today the wound is smaller and cleaner and less painful. There is still a layer of slough on the surface. 04/03/2022: The wound was measured slightly larger today, but appears roughly the same to my eye. There is a layer of slough on the surface. Underneath, the tissue is quite fibrotic. 04/10/2022: The wound is slightly smaller today. There is slough and eschar accumulation. For some reason, her wrap got changed from 4 layers to 3 layers and her edema control is not as good, as a result. 04/17/2022: The return to true 4-layer compression has improved the wound considerably. It is much smaller and cleaner today. Edema control is excellent. 04/24/2022: Her wound continues to improve. It is smaller with just a bit of slough on the surface. Edema control is excellent. 05/08/2022: Her wound is smaller again today. There is still slough accumulation on the surface. Edema control remains excellent. 05/15/2022: The wound is substantially smaller this week. There is slough on the surface with excellent edema control. 05/22/2022: Her wound is smaller again this week. Minimal slough on the surface. Good edema control. 05/29/2022: Once again, her wound is smaller. There is a bit of slough accumulation. Edema control remains excellent. 06/05/2022: Her wound is smaller again by about half. There is some slough and eschar buildup. Edema control is consistently excellent. 06/12/2022: The wound was initially measured larger by couple of millimeters, but upon debridement, much of this was found to be slough and eschar. The wound is actually about the same size. 06/18/2022: The wound is down to just a couple of millimeters, covered by eschar. 06/26/2022: Her wound is healed. 07/02/2022: Patient was seen today as part of the secondary prevention program. This is a 2-week follow-up visit. Her wound remains closed. She is wearing  her compression stockings as advised. 08/31/2022: Returns with new wound, same site. Started as blister. Patient thinks compression stockings causing blisters. 09/10/2022: The small distal cluster of wounds has closed. She has a new wound proximal to the main wound. It is superficial and  limited to breakdown of skin and appears secondary to friction. The main wound has a fibrotic surface with slough accumulation. Edema control is acceptable. 09/17/2022: The proximal wound has eschar on the surface. Underneath, there is a little bit of slough. The fibrotic surface of the main wound has not really [name], [name] ([id number]) [id number] Page 11 of 15 improved, although there is some granulation tissue in the center. Edema control is good. 10/01/2022: The proximal wound is almost completely closed with just a glisten under illumination suggesting that it is not completely healed. The more distal wound is smaller by about half a centimeter. There is slough accumulation on the surface and it is a little bit less fibrotic today. 10/08/2022: The proximal wound is healed. The more distal wound is smaller. There is some slough accumulation and a little bit of eschar around the edges. Today, the patient pointed out a wound on her left lower abdomen that has been present since a colostomy reversal in 2018. She has not ever discussed this with the surgeon that performed her ostomy takedown and this is the first time she has mentioned it to me. The tissue was very friable and apparently her PCP just started her on Augmentin, although I do not see any obvious signs of infection. 10/16/2022: The leg wound is smaller again today and more superficial. It is much cleaner with very little slough on the surface. The old ostomy site still has hypertrophic granulation tissue present. She is awaiting approval from insurance to see Dr. Maisie Fus, the surgeon that performed her ostomy reversal. 10/22/2022:  The leg wound continues to contract. There is minimal slough on the surface. Edema control is good. The old ostomy site looks about the same, but the hypertrophic granulation tissue is less prominent today. 10/29/2022: She has a new leg wound just proximal and lateral to the existing site. There is a little bit of slough on the surface. It looks as though perhaps some skin tore here. The existing leg wound is more superficial and also has a layer of slough on the surface. The old ostomy site is stable with some slough present. She still does not have an appointment with surgery. 11/05/2022: The satellite wound that was new last week is closed. The initial leg wound is smaller and more superficial with minimal slough on the surface. The old ostomy site is stable with some slough present. Apparently the patient has been waiting on her PCP to make a referral to surgery, rather than contacting them herself, even though she has been an established patient. 11/12/2022: The leg wound continues to be more superficial. There is some eschar that has built up around the edges. The old ostomy site is stable. She has an appointment with general surgery on October 8. 11/19/2022: The leg wound is nearly healed. There is thin slough on the surface. The old stoma site remains stable. She sees Dr. Maisie Fus tomorrow. 11/26/2022: The leg wound is down to just a very small pinhole opening under a layer of slough and eschar. The abdominal wound is stable. She has surgery coming up next month with Dr. Maisie Fus to address this. 12/03/2022: No significant change to the leg wound. There is a little bit of slough and eschar accumulation. Edema control is good. We are no longer managing the abdominal site. 12/10/2022: The wound on her leg remains unchanged. There is a little bit of eschar and slough present. Edema control is good. 12/17/2022: Her leg wound is covered with a layer of  eschar. Underneath, the wound remains open with a  little bit of slough accumulation. Patient History Information obtained from Patient. Family History Heart Disease - Mother,Father, Hypertension - Mother,Father, Kidney Disease - Mother, Thyroid Problems - Mother, No family history of Cancer, Diabetes, Hereditary Spherocytosis, Lung Disease, Seizures, Stroke, Tuberculosis. Social History Never smoker, Marital Status - Single, Alcohol Use - Never, Drug Use - No History, Caffeine Use - Daily - coffee. Medical History Eyes Patient has history of Cataracts Hematologic/Lymphatic Patient has history of Lymphedema Cardiovascular Patient has history of Hypertension, Peripheral Venous Disease Integumentary (Skin) Denies history of History of Burn Musculoskeletal Patient has history of Osteoarthritis Hospitalization/Surgery History - colostomy reversal. 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 Medical A Surgical History Notes nd Constitutional Symptoms (General Health) morbid obesity Cardiovascular schamberg disease, hyperlipidemia Gastrointestinal diverticulitis , h/o obstruction due to diverticulitis , colostomy and colostomy reversal Endocrine hypothyroidism Objective Constitutional Slightly hypertensive. no acute distress. Vitals Time Taken: 9:40 AM, Height: 62 in, Weight: 221 lbs, BMI: 40.4, Temperature: 98 F, Pulse: 83 bpm, Respiratory Rate: 18 breaths/min, Blood Pressure: 140/82 mmHg. [name], [name] ([id number]) [id number] Page 12 of 15 Respiratory Normal work of breathing on room air.. General Notes: 12/17/2022: Her leg wound is covered with a layer of eschar. Underneath, the wound remains open with a little bit of slough accumulation. Integumentary (Hair, Skin) Wound #10 status is Open. Original cause of wound was Surgical Injury. The date acquired was: 02/06/2017. The wound has been in treatment 10 weeks. The wound is located on the Left Abdomen - Lower Quadrant. The wound measures 0cm length x 0cm  width x 0cm depth; 0cm^2 area and 0cm^3 volume. There is no tunneling or undermining noted. There is a none present amount of drainage noted. The wound margin is distinct with the outline attached to the wound base. There is no granulation within the wound bed. There is no necrotic tissue within the wound bed. The periwound skin appearance had no abnormalities noted for moisture. The periwound skin appearance had no abnormalities noted for color. The periwound skin appearance did not exhibit: Scarring. Periwound temperature was noted as No Abnormality. Wound #9 status is Open. Original cause of wound was Gradually Appeared. The date acquired was: 07/14/2022. The wound has been in treatment 15 weeks. The wound is located on the [facility] Lower Leg. The wound measures 0.2cm length x 0.2cm width x 0.1cm depth; 0.031cm^2 area and 0.003cm^3 volume. There is Fat Layer (Subcutaneous Tissue) exposed. There is no tunneling or undermining noted. There is a none present amount of drainage noted. The wound margin is distinct with the outline attached to the wound base. There is no granulation within the wound bed. There is a large (67-100%) amount of necrotic tissue within the wound bed including Eschar. The periwound skin appearance had no abnormalities noted for moisture. The periwound skin appearance exhibited: Scarring, Hemosiderin Staining. Periwound temperature was noted as No Abnormality. 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 There was a Excisional Skin/Subcutaneous Tissue Debridement with a total area of 0.07 sq cm performed by [name], MD. With the following instrument(s): Curette to remove Viable and  Non-Viable tissue/material. Material removed includes Eschar, Subcutaneous Tissue, and Slough after achieving pain control using Lidocaine 4% T opical Solution. No specimens were taken. A time  out was conducted at 09:55, prior to the start of the procedure. A Minimum amount of bleeding was controlled with Pressure. The procedure was tolerated well with a pain level of 0 throughout and a pain level of 0 following the procedure. Post Debridement Measurements: 0.3cm length x 0.3cm width x 0.1cm depth; 0.007cm^3 volume. Character of Wound/Ulcer Post Debridement is improved. Post procedure Diagnosis Wound #9: Same as Pre-Procedure Pre-procedure diagnosis of Wound #9 is a Lymphedema located on the Left,Distal,Anterior Lower Leg . There was a Four Layer Compression Therapy Procedure by Karie Schwalbe, RN. Post procedure Diagnosis Wound #9: Same as Pre-Procedure Plan Follow-up Appointments: Return Appointment in 1 week. - Dr. Lady Gary 12/24/22 at 9:30am Room 3 Return Appointment in 2 weeks. - Please ask front desk for appointments Discharge From [facility]: Discharge from Wound Care Center - Please keep wearing compression stockings. Anesthetic: (In clinic) Topical Lidocaine 4% applied to wound bed Bathing/ Shower/ Hygiene: May shower with protection but do not get wound dressing(s) wet. Protect dressing(s) with water repellant cover (for example, large plastic bag) or a cast cover and may then take shower. Lymphedema Treatment Plan - Exercise, Compression and Elevation: Other: - Jobst Farrow wraps ordered-Pt. has this garment. WOUND #10: - Abdomen - Lower Quadrant Wound Laterality: Left Cleanser: Soap and Water 1 x Per Day/30 Days Discharge Instructions: May shower and wash wound with dial antibacterial soap and water prior to dressing change. Cleanser: Vashe 5.8 (oz) 1 x Per Day/30 Days Discharge Instructions: Cleanse the wound with Vashe prior to applying a clean dressing using gauze sponges,  not tissue or cotton balls. Cleanser: Wound Cleanser 1 x Per Day/30 Days Discharge Instructions: Cleanse the wound with wound cleanser prior to applying a clean dressing using gauze sponges, not tissue or cotton balls. Prim Dressing: Maxorb Extra Ag+ Alginate Dressing, 2x2 (in/in) 1 x Per Day/30 Days ary Discharge Instructions: Apply to wound bed as instructed Secondary Dressing: ABD Pad, 8x10 1 x Per Day/30 Days Discharge Instructions: Apply over primary dressing as directed. Secured With: 20M Medipore Scientist, research (life sciences) Surgical T 2x10 (in/yd) 1 x Per Day/30 Days ape Discharge Instructions: Secure with tape as directed. WOUND #9: - Lower Leg Wound Laterality: Left, Anterior, Distal YARITHZA, MINK ([id number]) [id number] Page 13 of 15 Cleanser: Vashe 5.8 (oz) 1 x Per Week/30 Days Discharge Instructions: Cleanse the wound with Vashe prior to applying a clean dressing using gauze sponges, not tissue or cotton balls. Topical: Gentamicin 1 x Per Week/30 Days Discharge Instructions: As directed by physician Topical: Mupirocin Ointment 1 x Per Week/30 Days Discharge Instructions: Apply Mupirocin (Bactroban) as instructed Prim Dressing: Endoform 2x2 in 1 x Per Week/30 Days ary Discharge Instructions: Moisten with saline Secondary Dressing: ABD Pad, 8x10 1 x Per Week/30 Days Discharge Instructions: Apply over primary dressing as directed. Com pression Wrap: FourPress (4 layer compression wrap) 1 x Per Week/30 Days Discharge Instructions: Apply four layer compression as directed. May also use Urgo K2 compression system as alternative. Com pression Wrap: Unnaboot w/Calamine, 4x10 (in/yd) 1 x Per Week/30 Days Discharge Instructions: Apply Unnaboot as directed. Com pression Stockings: Jobst Farrow Wrap 4000 Compression Amount: 30-40 mmHg (left) Discharge Instructions: Apply Renee Pain daily as instructed. Apply first thing in the morning, remove at night before  bed. 12/17/2022: Her leg wound is covered with a layer of eschar. Underneath, the wound remains open with a little bit of slough accumulation. I used a curette to debride slough, eschar, and subcutaneous tissue from the wound. We are  in the process of trying to obtain coverage for skin substitute. For now, we will continue topical gentamicin and mupirocin with endoform and and 4-layer compression/equivalent. Follow-up in 1 week. Electronic Signature(s) Signed: 12/18/2022 12:44:09 PM By: Shawn Stall RN, BSN Signed: 12/18/2022 12:58:46 PM By: [name] MD FACS Previous Signature: 12/17/2022 10:08:33 AM Version By: [name] MD FACS Entered By: Shawn Stall on 12/18/2022 12:42:05 -------------------------------------------------------------------------------- HxROS Details Patient Name: Date of Service: [name] 12/17/2022 9:30 A M Medical Record Number: [id number] Patient Account Number: [id number] Date of Birth/Sex: Treating RN: Mar 25, 1955 (67 y.o. F) Primary Care Provider: [name] Other Clinician: Referring Provider: Treating Provider/Extender: Jasmine Pang in Treatment: 15 Information Obtained From Patient Constitutional Symptoms (General Health) Medical History: Past Medical History Notes: morbid obesity Eyes Medical History: Positive for: Cataracts Hematologic/Lymphatic Medical History: Positive for: Lymphedema Cardiovascular Medical History: Positive for: Hypertension; Peripheral Venous Disease Past Medical History Notes: schamberg disease, hyperlipidemia Gastrointestinal Medical History: Past Medical History NotesALESE, FURNISS ([id number]) [phone].pdf Page 14 of 15 diverticulitis , h/o obstruction due to diverticulitis , colostomy and colostomy reversal Endocrine Medical History: Past Medical History Notes: hypothyroidism Integumentary (Skin) Medical History: Negative for:  History of Burn Musculoskeletal Medical History: Positive for: Osteoarthritis HBO Extended History Items Eyes: Cataracts Immunizations Pneumococcal Vaccine: Received Pneumococcal Vaccination: Yes Received Pneumococcal Vaccination On or After 60th Birthday: Yes Implantable Devices None Hospitalization / Surgery History Type of Hospitalization/Surgery colostomy reversal 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 Family and Social History Cancer: No; Diabetes: No; Heart Disease: Yes - Mother,Father; Hereditary Spherocytosis: No; Hypertension: Yes - Mother,Father; Kidney Disease: Yes - Mother; Lung Disease: No; Seizures: No; Stroke: No; Thyroid Problems: Yes - Mother; Tuberculosis: No; Never smoker; Marital Status - Single; Alcohol Use: Never; Drug Use: No History; Caffeine Use: Daily - coffee; Financial Concerns: No; Food, Clothing or Shelter Needs: No; Support System Lacking: No; Transportation Concerns: No Electronic Signature(s) Signed: 12/17/2022 12:35:03 PM By: [name] MD FACS Entered By: [name] on 12/17/2022 10:07:02 -------------------------------------------------------------------------------- SuperBill Details Patient Name: Date of Service: [name] 12/17/2022 Medical Record Number: [id number] Patient Account Number: [id number] Date of Birth/Sex: Treating RN: 1955/07/13 (67 y.o. 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 with other specifi Modifier: [id number] ed severity Quantity: 1 .pdf Page 15 of 15 Physician Procedures : CPT4 Code Description Modifier [id number] 99214 - WC PHYS LEVEL 4 - EST PT ICD-10 Diagnosis Description L97.928 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

## 2022-12-17 NOTE — Progress Notes (Signed)
TATIA, PETRUCCI [name] ([id number]) [id number] Page 1 of 9 Visit Report for 12/17/2022 Arrival Information Details Patient Name: Date of Service: [name], [name] 12/17/2022 9:30 A M Medical Record Number: [id number] Patient Account Number: [id number] Date of Birth/Sex: Treating RN: 01-30-1956 (67 y.o. [name] Primary Care [name]: [name] Other Clinician: Referring [name]: Treating [name]/Extender: [name] in Treatment: 15 Visit Information History Since Last Visit Added or deleted any medications: No Patient Arrived: Dan Humphreys Any new allergies or adverse reactions: No Arrival Time: 09:36 Had a fall or experienced change in No Accompanied By: self activities of daily living that may affect Transfer Assistance: None risk of falls: Patient Identification Verified: Yes Signs or symptoms of abuse/neglect since last visito No Patient Requires Transmission-Based Precautions: No Hospitalized since last visit: No Patient Has Alerts: No Implantable device outside of the clinic excluding No cellular tissue based products placed in the center since last visit: Has Dressing in Place as Prescribed: Yes Has Compression in Place as Prescribed: Yes Pain Present Now: No Electronic Signature(s) Signed: 12/17/2022 3:38:48 PM By: Karie Schwalbe RN Entered By: Karie Schwalbe on 12/17/2022 09:37:20 -------------------------------------------------------------------------------- Compression Therapy Details Patient Name: Date of Service: Erin Philips [name] 12/17/2022 9:30 A M Medical Record Number: [id number] Patient Account Number: [id number] Date of Birth/Sex: Treating RN: 11-29-1955 (67 y.o. [name] Primary Care Juel Bellerose: [name] Other Clinician: Referring [name]: Treating [name]/Extender: [name] in Treatment: 15 Compression Therapy Performed for Wound Assessment:  Wound #9 Left,Distal,Anterior Lower Leg Performed By: Clinician Karie Schwalbe, RN Compression Type: Four Layer Post Procedure Diagnosis Same as Pre-procedure Electronic Signature(s) Signed: 12/17/2022 3:38:48 PM By: Karie Schwalbe RN Entered By: Karie Schwalbe on 12/17/2022 10:09:40 [name] ([id number]) [id number] Page 2 of 9 -------------------------------------------------------------------------------- Encounter Discharge Information Details Patient Name: Date of Service: [name], [name] 12/17/2022 9:30 A M Medical Record Number: [id number] Patient Account Number: [id number] Date of Birth/Sex: Treating RN: [birth year] (67 y.o. [name] Primary Care Beckhem Isadore: [name] Other Clinician: Referring Zacaria Pousson: Treating [name]/Extender: [name] in Treatment: 15 Encounter Discharge Information Items Post Procedure Vitals Discharge Condition: Stable Temperature (F): 98 Ambulatory Status: Walker Pulse (bpm): 83 Discharge Destination: Home Respiratory Rate (breaths/min): 18 Transportation: Private Auto Blood Pressure (mmHg): 140/82 Accompanied By: self Schedule Follow-up Appointment: Yes Clinical Summary of Care: Patient Declined Electronic Signature(s) Signed: 12/17/2022 3:38:48 PM By: Karie Schwalbe RN Entered By: Karie Schwalbe on 12/17/2022 15:34:57 -------------------------------------------------------------------------------- Lower Extremity Assessment Details Patient Name: Date of Service: [name], [name] 12/17/2022 9:30 A M Medical Record Number: [id number] Patient Account Number: [id number] Date of Birth/Sex: Treating RN: [birth year] (67 y.o. [name] Primary Care Yazmyn Valbuena: [name] Other Clinician: Referring [name]: Treating [name]/Extender: [name] in Treatment: 15 Edema Assessment Assessed: [Left: No] [Right: No] [Left: Edema]  [Right: :] Calf Left: Right: Point of Measurement: 34 cm From Medial Instep 39 cm 45.7 cm Ankle Left: Right: Point of Measurement: 9 cm From Medial Instep 21 cm 22.8 cm Vascular Assessment Pulses: Dorsalis Pedis Palpable: [Right:Yes] Extremity colors, hair growth, and conditions: Hair Growth on Extremity: [Left:No] [Right:No] Temperature of Extremity: [Left:Warm] [Right:Warm] Capillary Refill: [Left:< 3 seconds] [Right:< 3 seconds] Dependent Rubor: [Left:No No] [Right:No No] Electronic Signature(s) Signed: 12/17/2022 3:38:48 PM By: Karie Schwalbe RN Entered By: Karie Schwalbe on 12/17/2022 09:48:49 [name] ([id number]) [id number] Page 3 of 9 -------------------------------------------------------------------------------- Multi Wound Chart Details Patient Name: Date of Service: [name], Erin  [name] 12/17/2022 9:30 A M Medical Record Number: [id number] Patient Account Number: [id number] Date of Birth/Sex: Treating RN: 09/03/1955 (67 y.o. F) Primary Care Kameria Canizares: [name] Other Clinician: Referring [name]: Treating [name]/Extender: [name] in Treatment: 15 Vital Signs Height(in): 62 Pulse(bpm): 83 Weight(lbs): 221 Blood Pressure(mmHg): 140/82 Body Mass Index(BMI): 40.4 Temperature(F): 98 Respiratory Rate(breaths/min): 18 [10:Photos:] [N/A:N/A] Left Abdomen - Lower Quadrant Left, Distal, Anterior Lower Leg N/A Wound Location: Surgical Injury Gradually Appeared N/A Wounding Event: Abscess Lymphedema N/A Primary Etiology: Cataracts, Lymphedema, Cataracts, Lymphedema, N/A Comorbid History: Hypertension, Peripheral Venous Hypertension, Peripheral Venous Disease, Osteoarthritis Disease, Osteoarthritis 02/06/2017 07/14/2022 N/A Date Acquired: 10 15 N/A Weeks of Treatment: Open Open N/A Wound Status: No No N/A Wound Recurrence: 0x0x0 0.2x0.2x0.1 N/A Measurements L x W x [name] 0 0.031 N/A A (cm)  : rea 0 0.003 N/A Volume (cm) : 100.00% 99.70% N/A % Reduction in A rea: 100.00% 99.70% N/A % Reduction in Volume: Full Thickness Without Exposed Full Thickness Without Exposed N/A Classification: Support Structures Support Structures None Present None Present N/A Exudate A mount: Distinct, outline attached Distinct, outline attached N/A Wound Margin: None Present (0%) None Present (0%) N/A Granulation A mount: None Present (0%) Large (67-100%) N/A Necrotic A mount: N/A Eschar N/A Necrotic Tissue: Fascia: No Fat Layer (Subcutaneous Tissue): Yes N/A Exposed Structures: Fat Layer (Subcutaneous Tissue): No Fascia: No Tendon: No Tendon: No Muscle: No Muscle: No Joint: No Joint: No Bone: No Bone: No Large (67-100%) Medium (34-66%) N/A Epithelialization: N/A Debridement - Excisional N/A Debridement: Pre-procedure Verification/Time Out N/A 09:55 N/A Taken: N/A Lidocaine 4% Topical Solution N/A Pain Control: N/A Necrotic/Eschar, Subcutaneous, N/A Tissue Debrided: Slough N/A Skin/Subcutaneous Tissue N/A Level: N/A 0.07 N/A Debridement A (sq cm): rea N/A Curette N/A Instrument: N/A Minimum N/A Bleeding: N/A Pressure N/A Hemostasis Achieved: N/A 0 N/A Procedural Pain: N/A 0 N/A Post Procedural Pain: N/A Procedure was tolerated well N/A Debridement Treatment Response: N/A 0.3x0.3x0.1 N/A Post Debridement Measurements L x W x [name] N/A 0.007 N/A Post Debridement Volume: (cm) Scarring: No Scarring: Yes N/A Periwound Skin TextureMAYRELI, ALDEN [name] ([id number]) [id number] Page 4 of 9 No Abnormalities Noted No Abnormalities Noted N/A Periwound Skin Moisture: No Abnormalities Noted Hemosiderin Staining: Yes N/A Periwound Skin Color: No Abnormality No Abnormality N/A Temperature: N/A Debridement N/A Procedures Performed: Treatment Notes Electronic Signature(s) Signed: 12/17/2022 10:06:05 AM By: [name] MD FACS Entered  By: [name] on 12/17/2022 10:06:05 -------------------------------------------------------------------------------- Multi-Disciplinary Care Plan Details Patient Name: Date of Service: Erin Philips [name] 12/17/2022 9:30 A M Medical Record Number: [id number] Patient Account Number: [id number] Date of Birth/Sex: Treating RN: 1955/09/28 (67 y.o. [name] Primary Care Lemon Sternberg: [name] Other Clinician: Referring Nahima Ales: Treating Kenderick Kobler/Extender: [name] in Treatment: 15 Active Inactive Wound/Skin Impairment Nursing Diagnoses: Impaired tissue integrity Goals: Patient/caregiver will verbalize understanding of skin care regimen Date Initiated: 09/03/2022 Target Resolution Date: 02/11/2023 Goal Status: Active Interventions: Assess patient/caregiver ability to obtain necessary supplies Assess patient/caregiver ability to perform ulcer/skin care regimen upon admission and as needed Assess ulceration(s) every visit Provide education on ulcer and skin care Screen for HBO Treatment Activities: Skin care regimen initiated : 08/31/2022 Topical wound management initiated : 08/31/2022 Notes: Electronic Signature(s) Signed: 12/17/2022 3:38:48 PM By: Karie Schwalbe RN Entered By: Karie Schwalbe on 12/17/2022 15:33:45 -------------------------------------------------------------------------------- Pain Assessment Details Patient Name: Date of Service: Erin Philips [name] 12/17/2022 9:30 A M Medical Record Number: [id number] Patient Account Number: [id number] Date of Birth/Sex:  Treating RN: 08-20-1955 (67 y.o. [name] Primary Care Kaidan Harpster: [name] Other Clinician: Referring Blessen Kimbrough: Treating Nicholas Trompeter/Extender: Clarnce Flock [place], Octavio Graves [name] ([id number]) [id number] Page 5 of 9 Weeks in Treatment: 15 Active Problems Location of Pain Severity and Description of Pain Patient  Has Paino No Site Locations Pain Management and Medication Current Pain Management: Electronic Signature(s) Signed: 12/17/2022 3:38:48 PM By: Karie Schwalbe RN Entered By: Karie Schwalbe on 12/17/2022 09:47:09 -------------------------------------------------------------------------------- Patient/Caregiver Education Details Patient Name: Date of Service: [name] 11/4/2024andnbsp9:30 A M Medical Record Number: [id number] Patient Account Number: [id number] Date of Birth/Gender: Treating RN: 08-23-1955 (67 y.o. [name] Primary Care Physician: [name] Other Clinician: Referring Physician: Treating Physician/Extender: [name] in Treatment: 15 Education Assessment Education Provided To: Patient Education Topics Provided Electronic Signature(s) Signed: 12/17/2022 3:38:48 PM By: Karie Schwalbe RN Entered By: Karie Schwalbe on 12/17/2022 15:33:57 -------------------------------------------------------------------------------- Wound Assessment Details Patient Name: Date of Service: [name], [name] 12/17/2022 9:30 A [name], [name] ([id number]) [phone].pdf Page 6 of 9 Medical Record Number: [id number] Patient Account Number: [id number] Date of Birth/Sex: Treating RN: 09-Oct-1955 (67 y.o. [name] Primary Care Stavros Cail: [name] Other Clinician: Referring Jaiyon Wander: Treating Oliviana Mcgahee/Extender: [name] in Treatment: 15 Wound Status Wound Number: 10 Primary Abscess Etiology: Wound Location: Left Abdomen - Lower Quadrant Wound Open Wounding Event: Surgical Injury Status: Date Acquired: 02/06/2017 Notes: Dr. Romie Levee is taking care of this wound Weeks Of Treatment: 10 Comorbid Cataracts, Lymphedema, Hypertension, Peripheral Venous Clustered Wound: No History: Disease, Osteoarthritis Photos Wound Measurements Length: (cm) Width: (cm) Depth:  (cm) Area: (cm) Volume: (cm) 0 % Reduction in Area: 100% 0 % Reduction in Volume: 100% 0 Epithelialization: Large (67-100%) 0 Tunneling: No 0 Undermining: No Wound Description Classification: Full Thickness Without Exposed Support Structures Wound Margin: Distinct, outline attached Exudate Amount: None Present Foul Odor After Cleansing: No Slough/Fibrino No Wound Bed Granulation Amount: None Present (0%) Exposed Structure Necrotic Amount: None Present (0%) Fascia Exposed: No Fat Layer (Subcutaneous Tissue) Exposed: No Tendon Exposed: No Muscle Exposed: No Joint Exposed: No Bone Exposed: No Periwound Skin Texture Texture Color No Abnormalities Noted: No No Abnormalities Noted: Yes Scarring: No Temperature / Pain Temperature: No Abnormality Moisture No Abnormalities Noted: Yes Treatment Notes Wound #10 (Abdomen - Lower Quadrant) Wound Laterality: Left Cleanser Soap and Water Discharge Instruction: May shower and wash wound with dial antibacterial soap and water prior to dressing change. Vashe 5.8 (oz) Discharge Instruction: Cleanse the wound with Vashe prior to applying a clean dressing using gauze sponges, not tissue or cotton balls. Wound Cleanser Discharge Instruction: Cleanse the wound with wound cleanser prior to applying a clean dressing using gauze sponges, not tissue or cotton balls. Peri-Wound Care Topical [name], [name] ([id number]) [id number] Page 7 of 9 Primary Dressing Maxorb Extra Ag+ Alginate Dressing, 2x2 (in/in) Discharge Instruction: Apply to wound bed as instructed Secondary Dressing ABD Pad, 8x10 Discharge Instruction: Apply over primary dressing as directed. Secured With [employer] T 2x10 (in/yd) ape Discharge Instruction: Secure with tape as directed. Compression Wrap Compression Stockings Add-Ons Electronic Signature(s) Signed: 12/17/2022 3:38:48 PM By: Karie Schwalbe RN Entered By:  Karie Schwalbe on 12/17/2022 09:53:26 -------------------------------------------------------------------------------- Wound Assessment Details Patient Name: Date of Service: Erin Philips [name] 12/17/2022 9:30 A M Medical Record Number: [id number] Patient Account Number: [id number] Date of Birth/Sex: Treating RN: 10/02/1955 (67 y.o. [name] Primary Care Onofrio Klemp: [name] Other Clinician:  Referring Waylynn Benefiel: Treating Tomoki Lucken/Extender: [name] in Treatment: 15 Wound Status Wound Number: 9 Primary Lymphedema Etiology: Wound Location: Left, Distal, Anterior Lower Leg Wound Open Wounding Event: Gradually Appeared Status: Date Acquired: 07/14/2022 Comorbid Cataracts, Lymphedema, Hypertension, Peripheral Venous Weeks Of Treatment: 15 History: Disease, Osteoarthritis Clustered Wound: No Photos Wound Measurements Length: (cm) 0.2 Width: (cm) 0.2 Depth: (cm) 0.1 Area: (cm) 0.031 Volume: (cm) 0.003 % Reduction in Area: 99.7% % Reduction in Volume: 99.7% Epithelialization: Medium (34-66%) Tunneling: No Undermining: No Wound Description Classification: Full Thickness Without Exposed Support Wound Margin: Distinct, outline attached Exudate Amount: None Present Structures Foul Odor After Cleansing: No Slough/Fibrino No Wound Bed [name], [name] ([id number]) [id number] Page 8 of 9 Granulation Amount: None Present (0%) Exposed Structure Necrotic Amount: Large (67-100%) Fascia Exposed: No Necrotic Quality: Eschar Fat Layer (Subcutaneous Tissue) Exposed: Yes Tendon Exposed: No Muscle Exposed: No Joint Exposed: No Bone Exposed: No Periwound Skin Texture Texture Color No Abnormalities Noted: No No Abnormalities Noted: No Scarring: Yes Hemosiderin Staining: Yes Moisture Temperature / Pain No Abnormalities Noted: Yes Temperature: No Abnormality Treatment Notes Wound #9 (Lower Leg) Wound Laterality:  Left, Anterior, Distal Cleanser Vashe 5.8 (oz) Discharge Instruction: Cleanse the wound with Vashe prior to applying a clean dressing using gauze sponges, not tissue or cotton balls. Peri-Wound Care Topical Gentamicin Discharge Instruction: As directed by physician Mupirocin Ointment Discharge Instruction: Apply Mupirocin (Bactroban) as instructed Primary Dressing Endoform 2x2 in Discharge Instruction: Moisten with saline Secondary Dressing ABD Pad, 8x10 Discharge Instruction: Apply over primary dressing as directed. Secured With Compression Wrap FourPress (4 layer compression wrap) Discharge Instruction: Apply four layer compression as directed. May also use Urgo K2 compression system as alternative. Unnaboot w/Calamine, 4x10 (in/yd) Discharge Instruction: Apply Unnaboot as directed. Compression Stockings Jobst Farrow Wrap 4000 Quantity: 1 Left Leg Compression Amount: 30-40 mmHg Discharge Instruction: Apply Renee Pain daily as instructed. Apply first thing in the morning, remove at night before bed. Add-Ons Electronic Signature(s) Signed: 12/17/2022 3:38:48 PM By: Karie Schwalbe RN Entered By: Karie Schwalbe on 12/17/2022 10:05:00 -------------------------------------------------------------------------------- Vitals Details Patient Name: Date of Service: Erin Rosier NITA [name] 12/17/2022 9:30 A M Medical Record Number: [id number] Patient Account Number: [id number] Date of Birth/Sex: Treating RN: October 11, 1955 (67 y.o. [name] Primary Care Fannye Myer: [name] Other Clinician: Referring [name] [name]: Treating Sinjin Amero/Extender: Clarnce Flock [place], Octavio Graves [name] ([id number]) [id number] Page 9 of 9 Weeks in Treatment: 15 Vital Signs Time Taken: 09:40 Temperature (F): 98 Height (in): 62 Pulse (bpm): 83 Weight (lbs): 221 Respiratory Rate (breaths/min): 18 Body Mass Index (BMI): 40.4 Blood Pressure (mmHg): 140/82 Reference  Range: 80 - 120 mg / dl Electronic Signature(s) Signed: 12/17/2022 3:38:48 PM By: Karie Schwalbe RN Entered By: Karie Schwalbe on 12/17/2022 09:46:34

## 2022-12-20 ENCOUNTER — Other Ambulatory Visit (HOSPITAL_COMMUNITY): Payer: Self-pay

## 2022-12-20 ENCOUNTER — Encounter: Payer: Self-pay | Admitting: Internal Medicine

## 2022-12-24 ENCOUNTER — Ambulatory Visit (HOSPITAL_BASED_OUTPATIENT_CLINIC_OR_DEPARTMENT_OTHER): Payer: Commercial Managed Care - PPO | Admitting: General Surgery

## 2022-12-24 ENCOUNTER — Encounter (HOSPITAL_BASED_OUTPATIENT_CLINIC_OR_DEPARTMENT_OTHER): Payer: Commercial Managed Care - PPO | Admitting: General Surgery

## 2022-12-24 DIAGNOSIS — I890 Lymphedema, not elsewhere classified: Secondary | ICD-10-CM | POA: Diagnosis not present

## 2022-12-24 DIAGNOSIS — L97928 Non-pressure chronic ulcer of unspecified part of left lower leg with other specified severity: Secondary | ICD-10-CM | POA: Diagnosis not present

## 2022-12-24 DIAGNOSIS — I87312 Chronic venous hypertension (idiopathic) with ulcer of left lower extremity: Secondary | ICD-10-CM | POA: Diagnosis not present

## 2022-12-24 DIAGNOSIS — I10 Essential (primary) hypertension: Secondary | ICD-10-CM | POA: Diagnosis not present

## 2022-12-24 NOTE — Progress Notes (Signed)
[name], [name] ([id number]) [id number] Page 1 of 8 Visit Report for 12/24/2022 Arrival Information Details Patient Name: Date of Service: [name], [name] 12/24/2022 9:45 A M Medical Record Number: [id number] Patient Account Number: [id number] Date of Birth/Sex: Treating RN: [name] (67 y.o. F) Primary Care [name]: [name] Other Clinician: Referring [name]: Treating [name]/Extender: [name] in Treatment: 16 Visit Information History Since Last Visit Added or deleted any medications: No Patient Arrived: Walker Any new allergies or adverse reactions: No Arrival Time: 09:45 Had a fall or experienced change in No Accompanied By: self activities of daily living that may affect Transfer Assistance: None risk of falls: Patient Identification Verified: Yes Signs or symptoms of abuse/neglect since last visito No Secondary Verification Process Completed: Yes Hospitalized since last visit: No Patient Requires Transmission-Based Precautions: No Implantable device outside of the clinic excluding No Patient Has Alerts: No cellular tissue based products placed in the center since last visit: Pain Present Now: No Electronic Signature(s) Signed: 12/24/2022 10:04:34 AM By: [name] Entered By: [name] on 12/24/2022 09:46:02 -------------------------------------------------------------------------------- Clinic Level of Care Assessment Details Patient Name: Date of Service: [name] 12/24/2022 9:45 A M Medical Record Number: [id number] Patient Account Number: [id number] Date of Birth/Sex: Treating RN: [name] (67 y.o. [name] Primary Care Jermell Holeman: [name] Other Clinician: Referring [name]: Treating [name]/Extender: [name] in Treatment: 16 Clinic Level of Care Assessment Items TOOL 4 Quantity Score X- 1 0 Use when only an EandM is performed  on FOLLOW-UP visit ASSESSMENTS - Nursing Assessment / Reassessment X- 1 10 Reassessment of Co-morbidities (includes updates in patient status) X- 1 5 Reassessment of Adherence to Treatment Plan ASSESSMENTS - Wound and Skin A ssessment / Reassessment X - Simple Wound Assessment / Reassessment - one wound 1 5 []  - 0 Complex Wound Assessment / Reassessment - multiple wounds []  - 0 Dermatologic / Skin Assessment (not related to wound area) ASSESSMENTS - Focused Assessment []  - 0 Circumferential Edema Measurements - multi extremities []  - 0 Nutritional Assessment / Counseling / Intervention []  - 0 Lower Extremity Assessment (monofilament, tuning fork, pulses) [name], [name] ([id number]) [id number] Page 2 of 8 []  - 0 Peripheral Arterial Disease Assessment (using hand held doppler) ASSESSMENTS - Ostomy and/or Continence Assessment and Care []  - 0 Incontinence Assessment and Management []  - 0 Ostomy Care Assessment and Management (repouching, etc.) PROCESS - Coordination of Care X - Simple Patient / Family Education for ongoing care 1 15 []  - 0 Complex (extensive) Patient / Family Education for ongoing care X- 1 10 Staff obtains Chiropractor, Records, T Results / Process Orders est X- 1 10 Staff telephones HHA, Nursing Homes / Clarify orders / etc []  - 0 Routine Transfer to another Facility (non-emergent condition) []  - 0 Routine Hospital Admission (non-emergent condition) []  - 0 New Admissions / Manufacturing engineer / Ordering NPWT Apligraf, etc. , []  - 0 Emergency Hospital Admission (emergent condition) X- 1 10 Simple Discharge Coordination []  - 0 Complex (extensive) Discharge Coordination PROCESS - Special Needs []  - 0 Pediatric / Minor Patient Management []  - 0 Isolation Patient Management []  - 0 Hearing / Language / Visual special needs []  - 0 Assessment of Community assistance (transportation, [name]/C planning, etc.) []  - 0 Additional  assistance / Altered mentation []  - 0 Support Surface(s) Assessment (bed, cushion, seat, etc.) INTERVENTIONS - Wound Cleansing / Measurement X - Simple Wound Cleansing - one wound 1 5 []  - 0 Complex  Wound Cleansing - multiple wounds X- 1 5 Wound Imaging (photographs - any number of wounds) []  - 0 Wound Tracing (instead of photographs) X- 1 5 Simple Wound Measurement - one wound []  - 0 Complex Wound Measurement - multiple wounds INTERVENTIONS - Wound Dressings []  - 0 Small Wound Dressing one or multiple wounds []  - 0 Medium Wound Dressing one or multiple wounds []  - 0 Large Wound Dressing one or multiple wounds []  - 0 Application of Medications - topical []  - 0 Application of Medications - injection INTERVENTIONS - Miscellaneous []  - 0 External ear exam []  - 0 Specimen Collection (cultures, biopsies, blood, body fluids, etc.) []  - 0 Specimen(s) / Culture(s) sent or taken to Lab for analysis []  - 0 Patient Transfer (multiple staff / Nurse, adult / Similar devices) []  - 0 Simple Staple / Suture removal (25 or less) []  - 0 Complex Staple / Suture removal (26 or more) []  - 0 Hypo / Hyperglycemic Management (close monitor of Blood Glucose) []  - 0 Ankle / Brachial Index (ABI) - do not check if billed separately [name], [name] ([id number]) [id number] Page 3 of 8 X- 1 5 Vital Signs Has the patient been seen at the hospital within the last three years: Yes Total Score: 85 Level Of Care: New/Established - Level 3 Electronic Signature(s) Signed: 12/24/2022 1:44:21 PM By: Karie Schwalbe RN Entered By: Karie Schwalbe on 12/24/2022 10:40:21 -------------------------------------------------------------------------------- Encounter Discharge Information Details Patient Name: Date of Service: [name] 12/24/2022 9:45 A M Medical Record Number: [id number] Patient Account Number: [id number] Date of Birth/Sex: Treating RN: [name] (67 y.o.  [name] Primary Care Ramy Greth: [name] Other Clinician: Referring Maximilien Hayashi: Treating Umer Harig/Extender: [name] in Treatment: 16 Encounter Discharge Information Items Discharge Condition: Stable Ambulatory Status: Walker Discharge Destination: Home Transportation: Private Auto Accompanied By: self Schedule Follow-up Appointment: Yes Clinical Summary of Care: Patient Declined Electronic Signature(s) Signed: 12/24/2022 1:44:21 PM By: Karie Schwalbe RN Entered By: Karie Schwalbe on 12/24/2022 10:42:19 -------------------------------------------------------------------------------- Lower Extremity Assessment Details Patient Name: Date of Service: [name], [name] 12/24/2022 9:45 A M Medical Record Number: [id number] Patient Account Number: [id number] Date of Birth/Sex: Treating RN: [name] (67 y.o. [name] Primary Care Kyen Taite: [name] Other Clinician: Referring Jasha Hodzic: Treating Shriyans Kuenzi/Extender: [name] in Treatment: 16 Edema Assessment Assessed: [Left: No] [Right: No] [Left: Edema] [Right: :] Calf Left: Right: Point of Measurement: 34 cm From Medial Instep 39 cm 45.7 cm Ankle Left: Right: Point of Measurement: 9 cm From Medial Instep 21 cm 22.8 cm Vascular Assessment PulsesJENNALISE, [name] ([id number]) [Right:[id number] Page 4 of 8] Dorsalis Pedis Palpable: [Left:Yes] Extremity colors, hair growth, and conditions: Hair Growth on Extremity: [Left:No] [Right:No] Temperature of Extremity: [Left:Warm] [Right:Warm] Capillary Refill: [Left:< 3 seconds] [Right:< 3 seconds] Dependent Rubor: [Left:No No] [Right:No No] Electronic Signature(s) Signed: 12/24/2022 1:44:21 PM By: Karie Schwalbe RN Entered By: Karie Schwalbe on 12/24/2022 09:59:47 -------------------------------------------------------------------------------- Multi Wound Chart  Details Patient Name: Date of Service: [name] 12/24/2022 9:45 A M Medical Record Number: [id number] Patient Account Number: [id number] Date of Birth/Sex: Treating RN: [birth year] (67 y.o. F) Primary Care Izsak Meir: [name] Other Clinician: Referring Yittel Emrich: Treating Lajeana Strough/Extender: [name] in Treatment: 16 Vital Signs Height(in): 62 Pulse(bpm): 80 Weight(lbs): 221 Blood Pressure(mmHg): 127/81 Body Mass Index(BMI): 40.4 Temperature(F): 97.8 Respiratory Rate(breaths/min): 18 [9:Photos:] [N/A:N/A] Left, Distal, Anterior Lower Leg N/A N/A Wound Location: Gradually Appeared N/A N/A Wounding Event: Lymphedema N/A  N/A Primary Etiology: Cataracts, Lymphedema, N/A N/A Comorbid History: Hypertension, Peripheral Venous Disease, Osteoarthritis 07/14/2022 N/A N/A Date Acquired: 16 N/A N/A Weeks of Treatment: Open N/A N/A Wound Status: No N/A N/A Wound Recurrence: 0.1x0.1x0.1 N/A N/A Measurements L x W x [name] 0.008 N/A N/A A (cm) : rea 0.001 N/A N/A Volume (cm) : 99.90% N/A N/A % Reduction in Area: 99.90% N/A N/A % Reduction in Volume: Full Thickness Without Exposed N/A N/A Classification: Support Structures None Present N/A N/A Exudate A mount: Distinct, outline attached N/A N/A Wound Margin: None Present (0%) N/A N/A Granulation Amount: Large (67-100%) N/A N/A Necrotic Amount: Eschar N/A N/A Necrotic Tissue: Fat Layer (Subcutaneous Tissue): Yes N/A N/A Exposed Structures: Fascia: No Tendon: No Muscle: No Joint: No Bone: No Medium (34-66%) N/A N/A Epithelialization: Scarring: Yes N/A N/A [address]TRACIE, BROSE ([id number]) [id number] Page 5 of 8 No Abnormalities Noted N/A N/A Periwound Skin Moisture: Hemosiderin Staining: Yes N/A N/A Periwound Skin Color: No Abnormality N/A N/A Temperature: Treatment Notes Electronic Signature(s) Signed: 12/24/2022  10:18:32 AM By: [name] MD FACS Entered By: [name] on 12/24/2022 10:18:32 -------------------------------------------------------------------------------- Multi-Disciplinary Care Plan Details Patient Name: Date of Service: [name] 12/24/2022 9:45 A M Medical Record Number: [id number] Patient Account Number: [id number] Date of Birth/Sex: Treating RN: [birth year] (67 y.o. [name] Primary Care Noriko Macari: [name] Other Clinician: Referring Gerry Blanchfield: Treating Trevion Hoben/Extender: [name] in Treatment: 16 Active Inactive Electronic Signature(s) Signed: 12/24/2022 1:44:21 PM By: Karie Schwalbe RN Entered By: Karie Schwalbe on 12/24/2022 10:39:07 -------------------------------------------------------------------------------- Pain Assessment Details Patient Name: Date of Service: [name], [name] 12/24/2022 9:45 A M Medical Record Number: [id number] Patient Account Number: [id number] Date of Birth/Sex: Treating RN: [birth year] [name] (67 y.o. F) Primary Care Ardra Kuznicki: [name] Other Clinician: Referring Bartosz Luginbill: Treating Ashante Snelling/Extender: [name] in Treatment: 16 Active Problems Location of Pain Severity and Description of Pain Patient Has Paino No Site Locations [place], [place] [name] ([id number]) [id number] Page 6 of 8 Pain Management and Medication Current Pain Management: Electronic Signature(s) Signed: 12/24/2022 10:04:34 AM By: [name] Entered By: [name] on 12/24/2022 09:46:35 -------------------------------------------------------------------------------- Patient/Caregiver Education Details Patient Name: Date of Service: [name] 11/11/2024andnbsp9:45 A M Medical Record Number: [id number] Patient Account Number: [id number] Date of Birth/Gender: Treating RN: [name] (67 y.o. [name] Primary Care Physician: [name] Other Clinician: Referring Physician: Treating Physician/Extender: [name] in Treatment: 16 Education Assessment Education Provided To: Patient Education Topics Provided Nutrition: Methods: Explain/Verbal Responses: State content correctly Electronic Signature(s) Signed: 12/24/2022 1:44:21 PM By: Karie Schwalbe RN Entered By: Karie Schwalbe on 12/24/2022 10:41:36 -------------------------------------------------------------------------------- Wound Assessment Details Patient Name: Date of Service: [name], [name] 12/24/2022 9:45 A M Medical Record Number: [id number] Patient Account Number: [id number] Date of Birth/Sex: Treating RN: [birth year] (67 y.o. F) Primary Care Topher Buenaventura: [name] Other Clinician: Referring Teresa Lemmerman: Treating Tameyah Koch/Extender: [name] in Treatment: 16 Wound Status Wound Number: 9 Primary Lymphedema Etiology: Wound Location: Left, Distal, Anterior Lower Leg Wound Healed - Epithelialized Wounding Event: Gradually Appeared Status: Date Acquired: 07/14/2022 Comorbid Cataracts, Lymphedema, Hypertension, Peripheral Venous Weeks Of Treatment: 16 History: Disease, Osteoarthritis Clustered Wound: No Photos [name], [name] ([id number]) [id number] Page 7 of 8 Wound Measurements Length: (cm) Width: (cm) Depth: (cm) Area: (cm) Volume: (cm) 0 % Reduction in Area: 100% 0 % Reduction in Volume: 100% 0 Epithelialization: Large (67-100%) 0 Tunneling: No 0 Undermining: No Wound  Description Classification: Full Thickness Without Exposed Support Wound Margin: Distinct, outline attached Exudate Amount: None Present Structures Foul Odor After Cleansing: No Slough/Fibrino No Wound Bed Granulation Amount: None Present (0%) Exposed Structure Necrotic Amount: None Present (0%) Fascia Exposed: No Fat Layer (Subcutaneous Tissue) Exposed: No Tendon Exposed:  No Muscle Exposed: No Joint Exposed: No Bone Exposed: No Periwound Skin Texture Texture Color No Abnormalities Noted: No No Abnormalities Noted: No Scarring: No Hemosiderin Staining: No Moisture Temperature / Pain No Abnormalities Noted: Yes Temperature: No Abnormality Treatment Notes Wound #9 (Lower Leg) Wound Laterality: Left, Anterior, Distal Cleanser Peri-Wound Care Topical Primary Dressing Secondary Dressing Secured With Compression Wrap Compression Stockings Add-Ons Electronic Signature(s) Signed: 12/24/2022 1:44:21 PM By: Karie Schwalbe RN Entered By: Karie Schwalbe on 12/24/2022 10:20:34 Vitals Details -------------------------------------------------------------------------------- [name] ([id number]) [id number] Page 8 of 8 Patient Name: Date of Service: [name], [name] 12/24/2022 9:45 A M Medical Record Number: [id number] Patient Account Number: [id number] Date of Birth/Sex: Treating RN: [birth year][name] (67 y.o. F) Primary Care Harika Laidlaw: [name] Other Clinician: Referring Chistopher Mangino: Treating Lorey Pallett/Extender: [name] in Treatment: 16 Vital Signs Time Taken: 09:46 Temperature (F): 97.8 Height (in): 62 Pulse (bpm): 80 Weight (lbs): 221 Respiratory Rate (breaths/min): 18 Body Mass Index (BMI): 40.4 Blood Pressure (mmHg): 127/81 Reference Range: 80 - 120 mg / dl Electronic Signature(s) Signed: 12/24/2022 10:04:34 AM By: [name] Entered By: [name] on 12/24/2022 09:46:29

## 2022-12-24 NOTE — Progress Notes (Addendum)
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 slough. The fibrotic surface of the main wound has not really improved, although there is some granulation tissue in the center. Edema control is good. 10/01/2022: The proximal wound is almost completely closed with just a glisten under illumination suggesting that it is not completely healed. The more distal wound is smaller by about half a centimeter. There is slough accumulation on the  surface Erin it is a little bit less fibrotic today. 10/08/2022: The proximal wound is healed. The more  distal wound is smaller. There is some slough accumulation Erin a little bit of eschar around the edges. Today, the patient pointed out a wound on her left lower abdomen that has been present since a colostomy reversal in 2018. She has not ever discussed this with the surgeon that performed her ostomy takedown Erin this is the first time she has mentioned it to me. The tissue was very friable Erin apparently her PCP just started her on Augmentin, although I do not see any obvious signs of infection. 10/16/2022: The leg wound is smaller again today Erin more superficial. It is much cleaner with very little slough on the surface. The old ostomy site still has hypertrophic granulation tissue present. She is awaiting approval from insurance to see Dr. Maisie Fus, the surgeon that performed her ostomy reversal. 10/22/2022: The leg wound continues to contract. There is minimal slough on the surface. Edema control is good. The old ostomy site looks about the same, but the hypertrophic granulation tissue is less prominent today. 10/29/2022: She has a new leg wound just proximal Erin lateral to the existing site. There is a little bit of slough on the surface. It looks as though perhaps some skin tore here. The existing leg wound is more superficial Erin also has a layer of slough on the surface. The old ostomy site is stable with some slough present. She still does not have an appointment with surgery. 11/05/2022: The satellite wound that was new last week is closed. The initial leg wound is smaller Erin more superficial with minimal slough on the surface. The old ostomy site is stable with some slough present. Apparently the patient has been waiting on her PCP to make a referral to surgery, rather than contacting them herself, even though she has been an established patient. 11/12/2022: The leg wound continues to be more superficial. There is some eschar that has built up around the edges. The old ostomy site is stable. She  has an appointment with general surgery on October 8. 11/19/2022: The leg wound is nearly healed. There is thin slough on the surface. The old stoma site remains stable. She sees Dr. Maisie Fus tomorrow. 11/26/2022: The leg wound is down to just a very small pinhole opening under a layer of slough Erin eschar. The abdominal wound is stable. She has surgery coming up next month with Dr. Maisie Fus to address this. 12/03/2022: No significant change to the leg wound. There is a little bit of slough Erin eschar accumulation. Edema control is good. We are no longer managing the abdominal site. 12/10/2022: The wound on her leg remains unchanged. There is a little bit of eschar Erin slough present. Edema control is good. 12/17/2022: Her leg wound is covered with a layer of eschar. Underneath, the wound remains open with a little bit of slough accumulation. 12/24/2022: Her wound is healed. Patient History Information obtained from Patient. Family History Heart Disease - Mother,Father, Hypertension - Mother,Father, Kidney Disease - Mother, Thyroid Problems - Mother, No family history of Cancer, Diabetes, Hereditary Spherocytosis, Lung Disease, Seizures, Stroke, Tuberculosis. Social History Never smoker, Marital Status - Single, Alcohol Use - Never, Drug Use - No History, Caffeine Use - Daily - coffee. KATIRA, BACAK ([id number]) [id number] Page 10 of 12 Medical History Eyes Patient has history of Cataracts Hematologic/Lymphatic Patient has history of Lymphedema Cardiovascular Patient has history of Hypertension, Peripheral Venous Disease Integumentary (Skin)  Denies history of History of Burn Musculoskeletal Patient has history of Osteoarthritis Hospitalization/Surgery History - colostomy reversal. 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 Medical A Surgical History Notes nd Constitutional Symptoms (General Health) morbid obesity Cardiovascular schamberg disease,  hyperlipidemia Gastrointestinal diverticulitis , h/o obstruction due to diverticulitis , colostomy Erin colostomy reversal Endocrine hypothyroidism Objective Constitutional no acute distress. Vitals Time Taken: 9:46 AM, Height: 62 in, Weight: 221 lbs, BMI: 40.4, Temperature: 97.8 F, Pulse: 80 bpm, Respiratory Rate: 18 breaths/min, Blood Pressure: 127/81 mmHg. Respiratory Normal work of breathing on room air.. General Notes: 12/24/2022: Her wound is healed. Integumentary (Hair, Skin) Wound #9 status is Open. Original cause of wound was Gradually Appeared. The date acquired was: 07/14/2022. The wound has been in treatment 16 weeks. The wound is located on the [facility] Lower Leg. The wound measures 0.1cm length x 0.1cm width x 0.1cm depth; 0.008cm^2 area Erin 0.001cm^3 volume. There is Fat Layer (Subcutaneous Tissue) exposed. There is no tunneling or undermining noted. There is a none present amount of drainage noted. The wound margin is distinct with the outline attached to the wound base. There is no granulation within the wound bed. There is a large (67-100%) amount of necrotic tissue within the wound bed including Eschar. The periwound skin appearance had no abnormalities noted for moisture. The periwound skin appearance exhibited: Scarring, Hemosiderin Staining. Periwound temperature was noted as No Abnormality. 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 12/24/2022: Her wound is healed. She has her Farrow wrap with him today Erin was assisted in applying it. She needs to wear this on a daily basis Erin keep her legs elevated as much as possible during the day Erin at night while she sleeps. Her toenails are  quite long Erin beginning to press into the adjacent digits Erin I suggested that she make an appointment with podiatry to have them cut, otherwise she is at risk of developing new wounds. We will discharge her from the wound care center. She may follow-up in the future as needed. KADIENCE, HOLBERT ([id number]) [id number] Page 11 of 12 Electronic Signature(s) Signed: 12/24/2022 10:21:02 AM By: [name] MD FACS Entered By: [name] on 12/24/2022 10:21:02 -------------------------------------------------------------------------------- HxROS Details Patient Name: Date of Service: [name] [name]. 12/24/2022 9:45 A M Medical Record Number: [id number] Patient Account Number: [id number] Date of Birth/Sex: Treating RN: 12-03-1955 (67 y.o. F) Primary Care Provider: [name] Other Clinician: Referring Provider: Treating Provider/Extender: Jasmine Pang in Treatment: 16 Information Obtained From Patient Constitutional Symptoms (General Health) Medical History: Past Medical History Notes: morbid obesity Eyes Medical History: Positive for: Cataracts Hematologic/Lymphatic Medical History: Positive for: Lymphedema Cardiovascular Medical History: Positive for: Hypertension; Peripheral Venous Disease Past Medical History Notes: schamberg disease, hyperlipidemia Gastrointestinal Medical History: Past Medical History Notes: diverticulitis , h/o obstruction due to diverticulitis , colostomy Erin colostomy reversal Endocrine Medical History: Past Medical History Notes: hypothyroidism Integumentary (Skin) Medical History: Negative for: History of Burn Musculoskeletal Medical History: Positive for: Osteoarthritis HBO Extended History Items Eyes: Cataracts Immunizations DEDIE, [name] ([id number]) [id number] Page 12 of 12 Pneumococcal Vaccine: Received Pneumococcal Vaccination:  Yes Received Pneumococcal Vaccination On or After 60th Birthday: Yes Implantable Devices None Hospitalization / Surgery History Type of Hospitalization/Surgery colostomy reversal 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 Family Erin Social History Cancer:  No; Diabetes: No; Heart Disease: Yes - Mother,Father; Hereditary Spherocytosis: No; Hypertension: Yes - Mother,Father; Kidney Disease: Yes - Mother; Lung Disease: No; Seizures: No; Stroke: No; Thyroid Problems: Yes - Mother; Tuberculosis: No; Never smoker; Marital Status - Single; Alcohol Use: Never; Drug Use: No History; Caffeine Use: Daily - coffee; Financial Concerns: No; Food, Clothing or Shelter Needs: No; Support System Lacking: No; Transportation Concerns: No Electronic Signature(s) Signed: 12/24/2022 10:51:19 AM By: [name] MD FACS Entered By: [name] on 12/24/2022 10:19:05 -------------------------------------------------------------------------------- SuperBill Details Patient Name: Date of Service: [name] [name]. 12/24/2022 Medical Record Number: [id number] Patient Account Number: [id number] Date of Birth/Sex: Treating RN: [birth year] (67 y.o. 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 Facility Procedures : CPT4 Code: [id number] Description: 99213 - WOUND CARE VISIT-LEV 3 EST PT Modifier: Quantity: 1 Physician Procedures : CPT4 Code Description Modifier [id number] 99213 - WC PHYS LEVEL 3 - EST PT ICD-10 Diagnosis Description L97.928 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

## 2022-12-27 ENCOUNTER — Encounter (HOSPITAL_BASED_OUTPATIENT_CLINIC_OR_DEPARTMENT_OTHER): Payer: Self-pay | Admitting: General Surgery

## 2022-12-27 ENCOUNTER — Other Ambulatory Visit: Payer: Self-pay

## 2022-12-27 NOTE — Progress Notes (Signed)
Spoke w/ via phone for pre-op interview: patient  Lab needs dos: I-stat Lab results: EKG 01/18/22; Echo 2023 EF 65-70% COVID test: patient states asymptomatic no test needed. Arrive at 1215 01/08/23 NPO after MN except clear liquids. Clear liquids from MN until 1115 AM Med rec completed. Medications to take morning of surgery: levothyroxine Diabetic medication: NA Patient instructed no nail polish to be worn day of surgery. Patient instructed to bring photo id and insurance card day of surgery. Patient aware to have driver (ride ) / caregiver for 24 hours after surgery. Sister or niece to drive. Special Instructions: NA Patient verbalized understanding of instructions that were given at this phone interview. Patient denies shortness of breath, chest pain, fever, cough at this phone interview.   Cardiac clearance 01/19/23 by Dr. Wyline Mood.

## 2022-12-28 ENCOUNTER — Encounter: Payer: Self-pay | Admitting: Internal Medicine

## 2022-12-28 ENCOUNTER — Other Ambulatory Visit (HOSPITAL_COMMUNITY): Payer: Self-pay

## 2022-12-28 DIAGNOSIS — Z1231 Encounter for screening mammogram for malignant neoplasm of breast: Secondary | ICD-10-CM | POA: Diagnosis not present

## 2022-12-28 LAB — HM MAMMOGRAPHY

## 2022-12-31 ENCOUNTER — Ambulatory Visit (HOSPITAL_BASED_OUTPATIENT_CLINIC_OR_DEPARTMENT_OTHER): Payer: Commercial Managed Care - PPO | Admitting: General Surgery

## 2023-01-07 ENCOUNTER — Ambulatory Visit (HOSPITAL_BASED_OUTPATIENT_CLINIC_OR_DEPARTMENT_OTHER): Payer: Commercial Managed Care - PPO | Admitting: General Surgery

## 2023-01-08 ENCOUNTER — Ambulatory Visit (HOSPITAL_BASED_OUTPATIENT_CLINIC_OR_DEPARTMENT_OTHER): Payer: Commercial Managed Care - PPO | Admitting: Anesthesiology

## 2023-01-08 ENCOUNTER — Other Ambulatory Visit: Payer: Self-pay

## 2023-01-08 ENCOUNTER — Encounter (HOSPITAL_BASED_OUTPATIENT_CLINIC_OR_DEPARTMENT_OTHER): Payer: Self-pay | Admitting: General Surgery

## 2023-01-08 ENCOUNTER — Encounter (HOSPITAL_BASED_OUTPATIENT_CLINIC_OR_DEPARTMENT_OTHER)
Admission: RE | Disposition: A | Discharge status: Home / Self Care | Payer: Self-pay | Source: Home / Self Care | Attending: General Surgery

## 2023-01-08 ENCOUNTER — Ambulatory Visit (HOSPITAL_BASED_OUTPATIENT_CLINIC_OR_DEPARTMENT_OTHER)
Admission: RE | Admit: 2023-01-08 | Discharge: 2023-01-08 | Disposition: A | Discharge status: Home / Self Care | Payer: Commercial Managed Care - PPO | Attending: General Surgery | Admitting: General Surgery

## 2023-01-08 DIAGNOSIS — L923 Foreign body granuloma of the skin and subcutaneous tissue: Secondary | ICD-10-CM | POA: Diagnosis not present

## 2023-01-08 DIAGNOSIS — T8189XA Other complications of procedures, not elsewhere classified, initial encounter: Secondary | ICD-10-CM | POA: Insufficient documentation

## 2023-01-08 DIAGNOSIS — I10 Essential (primary) hypertension: Secondary | ICD-10-CM

## 2023-01-08 DIAGNOSIS — E785 Hyperlipidemia, unspecified: Secondary | ICD-10-CM | POA: Diagnosis not present

## 2023-01-08 DIAGNOSIS — T85692A Other mechanical complication of permanent sutures, initial encounter: Secondary | ICD-10-CM

## 2023-01-08 DIAGNOSIS — L928 Other granulomatous disorders of the skin and subcutaneous tissue: Secondary | ICD-10-CM | POA: Diagnosis not present

## 2023-01-08 DIAGNOSIS — Z01818 Encounter for other preprocedural examination: Secondary | ICD-10-CM

## 2023-01-08 DIAGNOSIS — M795 Residual foreign body in soft tissue: Secondary | ICD-10-CM | POA: Diagnosis not present

## 2023-01-08 DIAGNOSIS — E039 Hypothyroidism, unspecified: Secondary | ICD-10-CM | POA: Diagnosis not present

## 2023-01-08 HISTORY — DX: Cardiac murmur, unspecified: R01.1

## 2023-01-08 HISTORY — PX: WOUND EXPLORATION: SHX6188

## 2023-01-08 HISTORY — PX: SUTURE REMOVAL: SHX6354

## 2023-01-08 LAB — POCT I-STAT, CHEM 8
BUN: 16 mg/dL (ref 8–23)
Calcium, Ion: 1.23 mmol/L (ref 1.15–1.40)
Chloride: 108 mmol/L (ref 98–111)
Creatinine, Ser: 1.1 mg/dL — ABNORMAL HIGH (ref 0.44–1.00)
Glucose, Bld: 92 mg/dL (ref 70–99)
HCT: 37 % (ref 36.0–46.0)
Hemoglobin: 12.6 g/dL (ref 12.0–15.0)
Potassium: 3.9 mmol/L (ref 3.5–5.1)
Sodium: 143 mmol/L (ref 135–145)
TCO2: 22 mmol/L (ref 22–32)

## 2023-01-08 SURGERY — WOUND EXPLORATION
Anesthesia: Monitor Anesthesia Care | Site: Abdomen

## 2023-01-08 MED ORDER — FENTANYL CITRATE (PF) 100 MCG/2ML IJ SOLN
INTRAMUSCULAR | Status: AC
  Filled 2023-01-08: qty 2

## 2023-01-08 MED ORDER — FENTANYL CITRATE (PF) 100 MCG/2ML IJ SOLN: 25.0000 ug | INTRAMUSCULAR | Status: DC | PRN

## 2023-01-08 MED ORDER — SODIUM CHLORIDE 0.9% FLUSH: 3.0000 mL | Freq: Two times a day (BID) | INTRAVENOUS | Status: DC

## 2023-01-08 MED ORDER — 0.9 % SODIUM CHLORIDE (POUR BTL) OPTIME
TOPICAL | Status: DC | PRN
  Administered 2023-01-08: 500 mL

## 2023-01-08 MED ORDER — OXYCODONE HCL 5 MG PO TABS: 5.0000 mg | ORAL_TABLET | Freq: Once | ORAL | Status: DC | PRN

## 2023-01-08 MED ORDER — ACETAMINOPHEN 500 MG PO TABS
1000.0000 mg | ORAL_TABLET | ORAL | Status: AC
  Administered 2023-01-08: 1000 mg via ORAL

## 2023-01-08 MED ORDER — ACETAMINOPHEN 160 MG/5ML PO SOLN: 325.0000 mg | ORAL | Status: DC | PRN

## 2023-01-08 MED ORDER — LACTATED RINGERS IV SOLN: INTRAVENOUS | Status: DC

## 2023-01-08 MED ORDER — FENTANYL CITRATE (PF) 100 MCG/2ML IJ SOLN
INTRAMUSCULAR | Status: DC | PRN
  Administered 2023-01-08 (×2): 25 ug via INTRAVENOUS

## 2023-01-08 MED ORDER — CEFAZOLIN SODIUM-DEXTROSE 2-4 GM/100ML-% IV SOLN
INTRAVENOUS | Status: AC
Start: 2023-01-08 — End: ?
  Filled 2023-01-08: qty 100

## 2023-01-08 MED ORDER — ONDANSETRON HCL 4 MG/2ML IJ SOLN: 4.0000 mg | Freq: Once | INTRAMUSCULAR | Status: DC | PRN

## 2023-01-08 MED ORDER — MEPERIDINE HCL 25 MG/ML IJ SOLN: 6.2500 mg | INTRAMUSCULAR | Status: DC | PRN

## 2023-01-08 MED ORDER — CEFAZOLIN SODIUM-DEXTROSE 2-4 GM/100ML-% IV SOLN
2.0000 g | INTRAVENOUS | Status: AC
  Administered 2023-01-08: 2 g via INTRAVENOUS

## 2023-01-08 MED ORDER — PROPOFOL 500 MG/50ML IV EMUL
INTRAVENOUS | Status: AC
  Filled 2023-01-08: qty 50

## 2023-01-08 MED ORDER — ACETAMINOPHEN 325 MG PO TABS: 325.0000 mg | ORAL_TABLET | ORAL | Status: DC | PRN

## 2023-01-08 MED ORDER — SODIUM CHLORIDE 0.9 % IV SOLN: INTRAVENOUS | Status: DC

## 2023-01-08 MED ORDER — BUPIVACAINE-EPINEPHRINE 0.5% -1:200000 IJ SOLN
INTRAMUSCULAR | Status: DC | PRN
  Administered 2023-01-08: 19 mL

## 2023-01-08 MED ORDER — OXYCODONE HCL 5 MG/5ML PO SOLN: 5.0000 mg | Freq: Once | ORAL | Status: DC | PRN

## 2023-01-08 MED ORDER — MIDAZOLAM HCL 2 MG/2ML IJ SOLN
INTRAMUSCULAR | Status: DC | PRN
  Administered 2023-01-08: 1 mg via INTRAVENOUS

## 2023-01-08 MED ORDER — KETAMINE HCL 50 MG/5ML IJ SOSY
PREFILLED_SYRINGE | INTRAMUSCULAR | Status: AC
  Filled 2023-01-08: qty 5

## 2023-01-08 MED ORDER — PROPOFOL 500 MG/50ML IV EMUL
INTRAVENOUS | Status: DC | PRN
  Administered 2023-01-08: 50 mg via INTRAVENOUS
  Administered 2023-01-08: 200 ug/kg/min via INTRAVENOUS

## 2023-01-08 MED ORDER — MIDAZOLAM HCL 2 MG/2ML IJ SOLN
INTRAMUSCULAR | Status: AC
Start: 2023-01-08 — End: ?
  Filled 2023-01-08: qty 2

## 2023-01-08 MED ORDER — ACETAMINOPHEN 500 MG PO TABS
ORAL_TABLET | ORAL | Status: AC
Start: 2023-01-08 — End: ?
  Filled 2023-01-08: qty 2

## 2023-01-08 SURGICAL SUPPLY — 44 items
BENZOIN TINCTURE PRP APPL 2/3 (GAUZE/BANDAGES/DRESSINGS) IMPLANT
BLADE CLIPPER SENSICLIP SURGIC (BLADE) IMPLANT
BLADE EXTENDED COATED 6.5IN (ELECTRODE) IMPLANT
BLADE SURG 10 STRL SS (BLADE) ×1 IMPLANT
CHLORAPREP W/TINT 26 (MISCELLANEOUS) ×1 IMPLANT
COVER BACK TABLE 60X90IN (DRAPES) ×1 IMPLANT
COVER MAYO STAND STRL (DRAPES) ×1 IMPLANT
DERMABOND ADVANCED .7 DNX12 (GAUZE/BANDAGES/DRESSINGS) IMPLANT
DRAPE LAPAROTOMY 100X72 PEDS (DRAPES) ×1 IMPLANT
DRAPE UTILITY XL STRL (DRAPES) ×1 IMPLANT
DRSG TEGADERM 4X4.75 (GAUZE/BANDAGES/DRESSINGS) IMPLANT
ELECT REM PT RETURN 9FT ADLT (ELECTROSURGICAL) ×1
ELECTRODE REM PT RTRN 9FT ADLT (ELECTROSURGICAL) ×1 IMPLANT
GAUZE 4X4 16PLY ~~LOC~~+RFID DBL (SPONGE) ×1 IMPLANT
GAUZE SPONGE 4X4 12PLY STRL (GAUZE/BANDAGES/DRESSINGS) ×1 IMPLANT
GLOVE BIO SURGEON STRL SZ 6.5 (GLOVE) ×2 IMPLANT
GLOVE BIOGEL PI IND STRL 7.0 (GLOVE) ×1 IMPLANT
GLOVE INDICATOR 6.5 STRL GRN (GLOVE) ×1 IMPLANT
KIT TURNOVER CYSTO (KITS) ×1 IMPLANT
NDL HYPO 22X1.5 SAFETY MO (MISCELLANEOUS) ×1 IMPLANT
NEEDLE HYPO 22X1.5 SAFETY MO (MISCELLANEOUS) ×1
NS IRRIG 500ML POUR BTL (IV SOLUTION) IMPLANT
PACK BASIN DAY SURGERY FS (CUSTOM PROCEDURE TRAY) ×1 IMPLANT
PAD ARMBOARD 7.5X6 YLW CONV (MISCELLANEOUS) IMPLANT
PENCIL SMOKE EVACUATOR (MISCELLANEOUS) ×1 IMPLANT
SLEEVE SCD COMPRESS KNEE MED (STOCKING) ×1 IMPLANT
SPIKE FLUID TRANSFER (MISCELLANEOUS) IMPLANT
STRIP CLOSURE SKIN 1/2X4 (GAUZE/BANDAGES/DRESSINGS) IMPLANT
SUT ETHILON 2 0 FS 18 (SUTURE) IMPLANT
SUT ETHILON 4 0 PS 2 18 (SUTURE) IMPLANT
SUT SILK 2 0 SH (SUTURE) IMPLANT
SUT VIC AB 2-0 SH 27XBRD (SUTURE) IMPLANT
SUT VIC AB 3-0 SH 18 (SUTURE) IMPLANT
SUT VIC AB 4-0 PS2 18 (SUTURE) IMPLANT
SUT VIC AB 4-0 SH 18 (SUTURE) IMPLANT
SWAB CULTURE ESWAB REG 1ML (MISCELLANEOUS) IMPLANT
SYR BULB IRRIG 60ML STRL (SYRINGE) ×1 IMPLANT
SYR CONTROL 10ML LL (SYRINGE) ×1 IMPLANT
TOWEL OR 17X24 6PK STRL BLUE (TOWEL DISPOSABLE) ×1 IMPLANT
TRAY DSU PREP LF (CUSTOM PROCEDURE TRAY) IMPLANT
TUBE CONNECTING 12X1/4 (SUCTIONS) ×1 IMPLANT
UNDERPAD 30X36 HEAVY ABSORB (UNDERPADS AND DIAPERS) IMPLANT
WATER STERILE IRR 500ML POUR (IV SOLUTION) ×1 IMPLANT
YANKAUER SUCT BULB TIP NO VENT (SUCTIONS) ×1 IMPLANT

## 2023-01-08 NOTE — Anesthesia Procedure Notes (Signed)
Procedure Name: MAC Date/Time: 01/08/2023 12:46 PM  Performed by: Francie Massing, CRNAPre-anesthesia Checklist: Patient identified, Emergency Drugs available, Suction available, Patient being monitored and Timeout performed Oxygen Delivery Method: Simple face mask

## 2023-01-08 NOTE — Op Note (Signed)
01/08/2023  1:18 PM  PATIENT:  [name]  67 y.o. female  Patient Care Team: [name], MD as PCP - General (Internal Medicine) Wyline Mood Alben Spittle, MD as PCP - Cardiology (Cardiology)  PRE-OPERATIVE DIAGNOSIS:  SUTURE GRANULOMA  POST-OPERATIVE DIAGNOSIS:  SUTURE GRANULOMA  PROCEDURE:  ABDOMINAL WOUND EXPLORATION REMOVAL OF FOREIGN OBJECT    Surgeon(s): Romie Levee, MD  ASSISTANT: none   ANESTHESIA:   local and MAC  EBL: 1ml Total I/O In: 100 [IV Piggyback:100] Out: 1 [Blood:1]  DRAINS: none   SPECIMEN:  No Specimen  DISPOSITION OF SPECIMEN:  N/A  COUNTS:  YES  PLAN OF CARE: Discharge to home after PACU  PATIENT DISPOSITION:  PACU - hemodynamically stable.  INDICATION: 67 y.o. female with a nonhealing wound after colostomy closure.  We discussed that this is most likely a suture granuloma and I recommended excision of this under anesthesia.   OR FINDINGS: Intact suture within the fascia  DESCRIPTION: the patient was identified in the preoperative holding area and taken to the OR where they were laid supine on the operating room table.  MAC anesthesia was induced without difficulty. SCDs were also noted to be in place prior to the initiation of anesthesia.  The patient was then prepped and draped in the usual sterile fashion.   A surgical timeout was performed indicating the correct patient, procedure, positioning and need for preoperative antibiotics.  A field block was performed using half percent Marcaine with epinephrine.  I removed the granulation tissue from the wound and followed the tract down to the suture within the fascia.  The suture was elevated using a hemostat and clipped.  This was removed completely from the wound.  The wound was palpated and no other foreign bodies were identified.  The wound was then packed with gauze and covered with a sterile dressing.  Patient tolerated this well and was awakened from anesthesia and sent to the  postanesthesia care unit in stable condition.  All counts were correct per operating room staff.  Vanita Panda, MD  Colorectal and General Surgery [facility] Surgery

## 2023-01-08 NOTE — Anesthesia Preprocedure Evaluation (Addendum)
Anesthesia Evaluation  Patient identified by MRN, date of birth, ID band Patient awake    Reviewed: Allergy & Precautions, NPO status , Patient's Chart, lab work & pertinent test results  Airway Mallampati: II  TM Distance: >3 FB Neck ROM: Full    Dental no notable dental hx. (+) Poor Dentition, Missing, Dental Advisory Given,    Pulmonary neg pulmonary ROS   Pulmonary exam normal breath sounds clear to auscultation       Cardiovascular hypertension, + Peripheral Vascular Disease and + DOE  Normal cardiovascular exam+ Valvular Problems/Murmurs  Rhythm:Regular Rate:Normal     Neuro/Psych negative neurological ROS  negative psych ROS   GI/Hepatic negative GI ROS, Neg liver ROS,,,  Endo/Other  Hypothyroidism  Class 3 obesity  Renal/GU negative Renal ROS     Musculoskeletal  (+) Arthritis ,    Abdominal   Peds  Hematology  (+) Blood dyscrasia, anemia   Anesthesia Other Findings   Reproductive/Obstetrics negative OB ROS                             Anesthesia Physical Anesthesia Plan  ASA: 3  Anesthesia Plan: MAC   Post-op Pain Management: Minimal or no pain anticipated   Induction: Intravenous  PONV Risk Score and Plan: 4 or greater and Ondansetron, Dexamethasone, Midazolam and Propofol infusion  Airway Management Planned: Natural Airway, Simple Face Mask and Mask  Additional Equipment:   Intra-op Plan:   Post-operative Plan: Extubation in OR  Informed Consent: I have reviewed the patients History and Physical, chart, labs and discussed the procedure including the risks, benefits and alternatives for the proposed anesthesia with the patient or authorized representative who has indicated his/her understanding and acceptance.     Dental advisory given  Plan Discussed with: CRNA and Anesthesiologist  Anesthesia Plan Comments:         Anesthesia Quick Evaluation

## 2023-01-08 NOTE — H&P (Signed)
REFERRING PHYSICIAN:  [name], MD   PROVIDER:  Elenora Gamma, MD   MRN: [id number] DOB: 09-28-1955 DATE OF ENCOUNTER: 11/20/2022   Subjective   Chief Complaint: Colostomy       History of Present Illness: [name] is a 67 y.o. female who is seen today as an office consultation at the request of Dr. Lady Gary for evaluation of Colostomy .     Patient underwent a laparoscopic converted to open colostomy reversal with small bowel resection by me in December 2018.  Patient has chronic lymphedema and is seen regularly at the wound clinic for lower extremity wounds.  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     Review of Systems: A complete review of systems was obtained from the patient.  I have reviewed this information and discussed as appropriate with the patient.  See HPI as well for other ROS.     Medical History: Past Medical History: DiagnosisDate            GERD (gastroesophageal reflux disease)                  Hypertension               Thyroid disease               There is no problem list on file for this patient.     Past Surgical History: ProcedureLateralityDate            LAPAROSCOPY converted to open  sigmoid colon resection with colostomy  SMALL BOWEL RESECTION repair of small bowel x 2                  07/10/2016             Dr. Edythe Lynn            LAPAROSCOPIC CONVERTED OPEN COLOSTOMY REVISION WITH SMALL BOWEL RESECTION                        02/06/2017             Dr. Clovis Pu     No Known Allergies   Current Outpatient Medications on File Prior to Visit MedicationSigDispenseRefill            amLODIPine (NORVASC) 5 MG tablet          Take 5 mg by mouth once daily                                 amoxicillin-clavulanate (AUGMENTIN) 875-125 mg tablet    Take 1 tablet by mouth 2 (two) times daily                               levothyroxine (SYNTHROID) 112 MCG tablet           Take 112 mcg by mouth every morning before  breakfast (0630)                                 lisinopriL (ZESTRIL) 20 MG tablet     Take 20 mg by mouth once daily  simvastatin (ZOCOR) 20 MG tablet   Take 20 mg by mouth once daily                               TORsemide (DEMADEX) 20 MG tablet         Take by mouth                                    multivitamin (MULTIPLE VITAMINS DAILY ORAL)   Take by mouth                                    potassium chloride (KLOR-CON) 10 MEQ ER tablet Take 10 mEq by mouth once daily                   No current facility-administered medications on file prior to visit.     Family History ProblemRelationAge of Onset            High blood pressure (Hypertension)   Mother             Hyperlipidemia (Elevated cholesterol)            Mother             Coronary Artery Disease (Blocked arteries around heart)     Mother             Coronary Artery Disease (Blocked arteries around heart)     Father              Hyperlipidemia (Elevated cholesterol)            Father              High blood pressure (Hypertension)   Father       Social History   Tobacco Use Smoking StatusNever Smokeless TobaccoNever     Social History   Socioeconomic History Marital status:Single Tobacco Use Smoking status:Never Smokeless tobacco:Never Substance and Sexual Activity Alcohol ZOX:WRUEA Drug VWU:JWJXB   Social Determinants of Health   Financial Resource Strain: Low Risk  (05/17/2022)             Received from [facility]             Overall Financial Resource Strain (CARDIA)                        Difficulty of Paying Living Expenses: Not hard at all Food Insecurity: No Food Insecurity (05/17/2022)             Received from [facility]             Hunger Vital Sign                        Worried About Running Out of Food in the Last Year: Never true                        Ran Out of Food in the Last Year: Never true Transportation Needs: No Transportation Needs (05/17/2022)              Received from [facility] - Transportation  Lack of Transportation (Medical): No                        Lack of Transportation (Non-Medical): No Physical Activity: Unknown (05/17/2022)             Received from [facility]             Exercise Vital Sign                        Days of Exercise per Week: Patient declined Stress: No Stress Concern Present (05/17/2022)             Received from [facility] - Occupational Stress Questionnaire                        Feeling of Stress : Only a little Social Connections: Moderately Isolated (05/17/2022)             Received from [facility]             Social Connection and Isolation Panel [NHANES]                        Frequency of Communication with Friends and Family: More than three times a week                        Frequency of Social Gatherings with Friends and Family: More than three times a week                        Attends Religious Services: 1 to 4 times per year                        Active Member of Clubs or Organizations: No                        Marital Status: Never married     Objective:     Vitals: BP (!) 188/97   Pulse 91   Temp (!) 97.5 F (36.4 C) (Oral)   Resp 18   Ht 5\' 2"  (1.575 m)   Wt 104.3 kg   SpO2 97%   BMI 42.07 kg/m     Exam Gen: NAD Abd: soft Left lower quadrant wound approximately 1 cm in size and approximately 3 cm deep.  Most likely suture granuloma       Assessment and Plan: Diagnoses and all orders for this visit:   Suture granuloma, initial encounter     Patient with suture granuloma at her colostomy site.  I recommend wound exploration and suture removal under anesthesia.  We will then pack the wound, and this should allow for it to heal.  Patient agreeable to procedure.       Vanita Panda, MD Colon and Rectal Surgery [facility] Surgery

## 2023-01-08 NOTE — Discharge Instructions (Addendum)
Your wound is packed with gauze.  Starting tomorrow, remove the gauze and clean wound with soap and water.  Repack with clean gauze moistened with water or normal saline.  Cover with a dry dressing.  Change this daily.   No acetaminophen/Tylenol until after 5:30pm today if needed for pain.   Post Anesthesia Home Care Instructions  Activity: Get plenty of rest for the remainder of the day. A responsible individual must stay with you for 24 hours following the procedure.  For the next 24 hours, DO NOT: -Drive a car -Advertising copywriter -Drink alcoholic beverages -Take any medication unless instructed by your physician -Make any legal decisions or sign important papers.  Meals: Start with liquid foods such as gelatin or soup. Progress to regular foods as tolerated. Avoid greasy, spicy, heavy foods. If nausea and/or vomiting occur, drink only clear liquids until the nausea and/or vomiting subsides. Call your physician if vomiting continues.  Special Instructions/Symptoms: Your throat may feel dry or sore from the anesthesia or the breathing tube placed in your throat during surgery. If this causes discomfort, gargle with warm salt water. The discomfort should disappear within 24 hours.

## 2023-01-08 NOTE — Transfer of Care (Signed)
Immediate Anesthesia Transfer of Care Note  Patient: [name]  Procedure(s) Performed: Procedure(s) (LRB): ABDOMINAL WOUND EXPLORATION (N/A) REMOVAL OF FOREIGN OBJECT (N/A)  Patient Location: PACU  Anesthesia Type: MAC  Level of Consciousness: awake, alert , oriented and patient cooperative  Airway & Oxygen Therapy: Patient Spontanous Breathing Room Air  Post-op Assessment: Report given to PACU RN and Post -op Vital signs reviewed and stable  Post vital signs: Reviewed and stable  Complications: No apparent anesthesia complications Last Vitals:  Vitals Value Taken Time  BP 137/65 01/08/23 1330  Temp 36.4 C 01/08/23 1327  Pulse 87 01/08/23 1334  Resp 18 01/08/23 1334  SpO2 98 % 01/08/23 1334  Vitals shown include unfiled device data.  Last Pain:  Vitals:   01/08/23 1327  TempSrc:   PainSc: 0-No pain      Patients Stated Pain Goal: 6 (01/08/23 1129)  Complications: No notable events documented.

## 2023-01-09 NOTE — Anesthesia Postprocedure Evaluation (Signed)
Anesthesia Post Note  Patient: [name]  Procedure(s) Performed: ABDOMINAL WOUND EXPLORATION (Abdomen) REMOVAL OF FOREIGN OBJECT (Abdomen)     Anesthesia Type: MAC Anesthetic complications: no   No notable events documented.  Last Vitals:  Vitals:   01/08/23 1400 01/08/23 1427  BP: (!) 148/69 (!) 146/66  Pulse: 74 73  Resp: 16 17  Temp: 36.8 C 36.8 C  SpO2: 96% 99%    Last Pain:  Vitals:   01/08/23 1427  TempSrc:   PainSc: 0-No pain                 Caleah Tortorelli

## 2023-01-14 ENCOUNTER — Encounter (HOSPITAL_BASED_OUTPATIENT_CLINIC_OR_DEPARTMENT_OTHER): Payer: Self-pay | Admitting: General Surgery

## 2023-02-14 ENCOUNTER — Other Ambulatory Visit (HOSPITAL_COMMUNITY): Payer: Self-pay

## 2023-02-14 ENCOUNTER — Encounter: Payer: Self-pay | Admitting: Internal Medicine

## 2023-02-14 ENCOUNTER — Ambulatory Visit: Payer: Commercial Managed Care - PPO | Admitting: Internal Medicine

## 2023-02-14 ENCOUNTER — Encounter (HOSPITAL_COMMUNITY): Payer: Self-pay

## 2023-02-14 VITALS — BP 110/68 | HR 75 | Temp 97.8°F | Ht 62.0 in | Wt 231.2 lb

## 2023-02-14 DIAGNOSIS — E66813 Obesity, class 3: Secondary | ICD-10-CM

## 2023-02-14 DIAGNOSIS — L304 Erythema intertrigo: Secondary | ICD-10-CM | POA: Diagnosis not present

## 2023-02-14 DIAGNOSIS — I119 Hypertensive heart disease without heart failure: Secondary | ICD-10-CM | POA: Diagnosis not present

## 2023-02-14 DIAGNOSIS — I358 Other nonrheumatic aortic valve disorders: Secondary | ICD-10-CM | POA: Diagnosis not present

## 2023-02-14 DIAGNOSIS — Z1331 Encounter for screening for depression: Secondary | ICD-10-CM | POA: Insufficient documentation

## 2023-02-14 DIAGNOSIS — M4005 Postural kyphosis, thoracolumbar region: Secondary | ICD-10-CM

## 2023-02-14 DIAGNOSIS — I739 Peripheral vascular disease, unspecified: Secondary | ICD-10-CM

## 2023-02-14 DIAGNOSIS — Z0000 Encounter for general adult medical examination without abnormal findings: Secondary | ICD-10-CM | POA: Diagnosis not present

## 2023-02-14 DIAGNOSIS — E039 Hypothyroidism, unspecified: Secondary | ICD-10-CM | POA: Diagnosis not present

## 2023-02-14 DIAGNOSIS — R7309 Other abnormal glucose: Secondary | ICD-10-CM | POA: Diagnosis not present

## 2023-02-14 DIAGNOSIS — E2839 Other primary ovarian failure: Secondary | ICD-10-CM

## 2023-02-14 DIAGNOSIS — Z6841 Body Mass Index (BMI) 40.0 and over, adult: Secondary | ICD-10-CM

## 2023-02-14 DIAGNOSIS — M25551 Pain in right hip: Secondary | ICD-10-CM

## 2023-02-14 DIAGNOSIS — T8189XS Other complications of procedures, not elsewhere classified, sequela: Secondary | ICD-10-CM | POA: Diagnosis not present

## 2023-02-14 LAB — POCT URINALYSIS DIPSTICK
Bilirubin, UA: NEGATIVE
Glucose, UA: NEGATIVE
Ketones, UA: NEGATIVE
Nitrite, UA: NEGATIVE
Protein, UA: NEGATIVE
Spec Grav, UA: 1.02 (ref 1.010–1.025)
Urobilinogen, UA: 0.2 U/dL
pH, UA: 5.5 (ref 5.0–8.0)

## 2023-02-14 MED ORDER — NYSTATIN 100000 UNIT/GM EX POWD
1.0000 | Freq: Two times a day (BID) | CUTANEOUS | 0 refills | Status: DC | PRN
  Filled 2023-02-14: qty 30, 15d supply, fill #0

## 2023-02-14 MED ORDER — WEGOVY 0.5 MG/0.5ML ~~LOC~~ SOAJ
0.5000 mg | SUBCUTANEOUS | 0 refills | Status: DC
  Filled 2023-02-14: qty 2, 28d supply, fill #0

## 2023-02-14 NOTE — Progress Notes (Signed)
 I,Victoria T Emmitt, CMA,acting as a neurosurgeon for Catheryn LOISE Slocumb, MD.,have documented all relevant documentation on the behalf of Catheryn LOISE Slocumb, MD,as directed by  Catheryn LOISE Slocumb, MD while in the presence of Catheryn LOISE Slocumb, MD.  Subjective:    Patient ID: [name] , female    DOB: 08-Mar-1955 , 68 y.o.   MRN: [id number]  Chief Complaint  Patient presents with   Annual Exam   Hypertension   Hypothyroidism    HPI  Patient is here for annual exam. Patient reports compliance with her meds. Denies headache, chest pain & sob. She has no specific questions or concerns.   Hypertension This is a chronic problem. The current episode started more than 1 year ago. The problem has been gradually improving since onset. The problem is controlled. Pertinent negatives include no blurred vision, chest pain, palpitations or shortness of breath. Risk factors for coronary artery disease include obesity, sedentary lifestyle and post-menopausal state. Compliance problems include exercise.      Past Medical History:  Diagnosis Date   Arthritis    DOE (dyspnea on exertion)    mild   Heart murmur    Hyperlipidemia    Hypertension    Hypothyroidism    Morbid obesity (HCC)    Pre-diabetes    Schamberg disease      Family History  Problem Relation Age of Onset   Hypertension Mother    Heart Problems Mother        Pacemaker   Heart attack Father        at age 78.   Heart attack Maternal Grandfather 13       Died of AAA   AAA (abdominal aortic aneurysm) Maternal Grandfather    Heart attack Maternal Uncle    Stroke Paternal Aunt    Alzheimer's disease Maternal Grandmother      Current Outpatient Medications:    amLODipine  (NORVASC ) 5 MG tablet, Take 1 tablet (5 mg total) by mouth daily. (Patient taking differently: Take 5 mg by mouth at bedtime.), Disp: 90 tablet, Rfl: 2   Calcium Carbonate-Vitamin D  600-400 MG-UNIT tablet, Take 1 tablet by mouth daily., Disp: , Rfl:     Cholecalciferol (VITAMIN D ) 2000 units tablet, Take 4,000 Units by mouth daily., Disp: , Rfl:    levothyroxine  (SYNTHROID ) 112 MCG tablet, Take 1 tablet (112 mcg total) by mouth daily., Disp: 90 tablet, Rfl: 1   lisinopril  (ZESTRIL ) 20 MG tablet, Take 1 tablet (20 mg total) by mouth daily., Disp: 90 tablet, Rfl: 2   Magnesium  400 MG TABS, Take by mouth., Disp: , Rfl:    Multiple Vitamins-Minerals (CENTRUM SILVER 50+WOMEN PO), Take by mouth. 1 per day, Disp: , Rfl:    nystatin  powder, Apply 1 Application topically 2 (two) times daily as needed., Disp: 30 g, Rfl: 0   potassium chloride  SA (KLOR-CON  M) 20 MEQ tablet, Take 1 tablet (20 mEq total) by mouth daily as needed (when taking Torsemide )., Disp: 90 tablet, Rfl: 1   Semaglutide -Weight Management (WEGOVY ) 0.5 MG/0.5ML SOAJ, Inject 0.5 mg into the skin once a week., Disp: 2 mL, Rfl: 0   simvastatin  (ZOCOR ) 20 MG tablet, Take 1 tablet (20 mg total) by mouth daily., Disp: 90 tablet, Rfl: 1   torsemide  (DEMADEX ) 20 MG tablet, Take 1 tablet (20 mg total) by mouth daily as needed (for edema)., Disp: 90 tablet, Rfl: 1   Allergies  Allergen Reactions   Latex Rash      The patient states  she uses post menopausal status for birth control. No LMP recorded. Patient is postmenopausal.. Negative for Dysmenorrhea. Negative for: breast discharge, breast lump(s), breast pain and breast self exam. Associated symptoms include abnormal vaginal bleeding. Pertinent negatives include abnormal bleeding (hematology), anxiety, decreased libido, depression, difficulty falling sleep, dyspareunia, history of infertility, nocturia, sexual dysfunction, sleep disturbances, urinary incontinence, urinary urgency, vaginal discharge and vaginal itching. Diet regular.The patient states her exercise level is  minimal.   . The patient's tobacco use is:  Social History   Tobacco Use  Smoking Status Never  Smokeless Tobacco Never  . She has been exposed to passive smoke. The  patient's alcohol use is:  Social History   Substance and Sexual Activity  Alcohol Use No    Review of Systems  Constitutional: Negative.   HENT: Negative.    Eyes: Negative.  Negative for blurred vision.  Respiratory: Negative.  Negative for shortness of breath.   Cardiovascular: Negative.  Negative for chest pain and palpitations.  Gastrointestinal: Negative.   Endocrine: Negative.   Genitourinary: Negative.   Musculoskeletal: Negative.   Skin: Negative.   Allergic/Immunologic: Negative.   Neurological: Negative.   Hematological: Negative.   Psychiatric/Behavioral: Negative.       Today's Vitals   02/14/23 0914  BP: 110/68  Pulse: 75  Temp: 97.8 F (36.6 C)  SpO2: 98%  Weight: 231 lb 3.2 oz (104.9 kg)  Height: 5' 2 (1.575 m)  PainSc: 4   PainLoc: Hip   Body mass index is 42.29 kg/m.  Wt Readings from Last 3 Encounters:  02/14/23 231 lb 3.2 oz (104.9 kg)  01/08/23 230 lb (104.3 kg)  10/01/22 227 lb 6.4 oz (103.1 kg)     Objective:  Physical Exam Vitals and nursing note reviewed.  Constitutional:      Appearance: Normal appearance.     Comments: Examined while in chair, seated  HENT:     Head: Normocephalic and atraumatic.     Right Ear: Tympanic membrane, ear canal and external ear normal.     Left Ear: Tympanic membrane, ear canal and external ear normal.     Nose: Nose normal.     Mouth/Throat:     Mouth: Mucous membranes are moist.     Pharynx: Oropharynx is clear.  Eyes:     Extraocular Movements: Extraocular movements intact.     Conjunctiva/sclera: Conjunctivae normal.     Pupils: Pupils are equal, round, and reactive to light.  Cardiovascular:     Rate and Rhythm: Normal rate and regular rhythm.     Heart sounds: Murmur heard.  Pulmonary:     Effort: Pulmonary effort is normal.     Breath sounds: Normal breath sounds.  Chest:  Breasts:    Tanner Score is 5.     Right: Normal.     Left: Normal.     Comments: Pendulous  Abdominal:      General: Bowel sounds are normal.     Palpations: Abdomen is soft.     Comments: Obese, soft. Erythema noted beneath pannus Healing wound left lower quadrant  Genitourinary:    Comments: deferred Musculoskeletal:        General: Normal range of motion.     Cervical back: Normal range of motion and neck supple.  Feet:     Comments: She kept her shoes on Skin:    General: Skin is warm and dry.     Comments: Erythema beneath lower abdominal fold  Neurological:     General:  No focal deficit present.     Mental Status: She is alert and oriented to person, place, and time.  Psychiatric:        Mood and Affect: Mood normal.        Behavior: Behavior normal.         Assessment And Plan:     Encounter for general adult medical examination w/o abnormal findings Assessment & Plan: A full exam was performed.  Importance of monthly self breast exams was discussed with the patient.  She is advised to get 30-45 minutes of regular exercise, no less than four to five days per week. Both weight-bearing and aerobic exercises are recommended.  She is advised to follow a healthy diet with at least six fruits/veggies per day, decrease intake of red meat and other saturated fats and to increase fish intake to twice weekly.  Meats/fish should not be fried -- baked, boiled or broiled is preferable. It is also important to cut back on your sugar intake.  Be sure to read labels - try to avoid anything with added sugar, high fructose corn syrup or other sweeteners.  If you must use a sweetener, you can try stevia or monkfruit.  It is also important to avoid artificially sweetened foods/beverages and diet drinks. Lastly, wear SPF 50 sunscreen on exposed skin and when in direct sunlight for an extended period of time.  Be sure to avoid fast food restaurants and aim for at least 60 ounces of water daily.       Hypertensive heart disease without heart failure Assessment & Plan: Chronic, well controlled.  EKG  performed, NSR w/ low voltage in precordial leads, anterior infarct -age undetermined. She will continue with amlodipine  5mg  daily and lisinopril  20mg  daily. She takes torsemide  as needed for LE edema. She is reminded to follow a low sodium diet.   Orders: -     POCT urinalysis dipstick -     Microalbumin / creatinine urine ratio -     EKG 12-Lead -     CBC -     CMP14+EGFR -     Lipid panel  Peripheral vascular disease (HCC) Assessment & Plan: Chronic, encouraged to increase daily activity as tolerated. She should continue with statin therapy as well.    Aortic valve sclerosis Assessment & Plan: Echo results from Jan 2023 were reviewed in full detail, aortic calcification listed as mild.  Will consider repeat echo within 6-12 months.    Primary hypothyroidism Assessment & Plan: Chronic, currently on levothyroxine  112mcg daily. Importance of medication compliance was discussed with the patient. I will check thyroid  panel and adjust meds as needed.   Orders: -     TSH + free T4  Other abnormal glucose Assessment & Plan: Previous labs reviewed, her A1c has been elevated in the past. I will check an A1c today. Reminded to avoid refined sugars including sugary drinks/foods and processed meats including bacon, sausages and deli meats.    Orders: -     Hemoglobin A1c  Suture granuloma, sequela Assessment & Plan: Operative note and post-op notes reviewed in detail. Wound is healing well.   Postural kyphosis of thoracolumbar region -     DG Bone Density; Future  Estrogen deficiency -     DG Bone Density; Future  Intertrigo -     Nystatin ; Apply 1 Application topically 2 (two) times daily as needed.  Dispense: 30 g; Refill: 0  Class 3 severe obesity due to excess calories with serious comorbidity  and body mass index (BMI) of 40.0 to 44.9 in adult [facility]) Assessment & Plan: She is encouraged to initially strive for BMI less than 35 to decrease cardiac risk. Advised to aim for  at least 150 minutes of exercise per week.    Other orders -     Wegovy ; Inject 0.5 mg into the skin once a week.  Dispense: 2 mL; Refill: 0  She is encouraged to strive for BMI less than 30 to decrease cardiac risk. Advised to aim for at least 150 minutes of exercise per week.    Return for 1 year physical, 6 month bp. Patient was given opportunity to ask questions. Patient verbalized understanding of the plan and was able to repeat key elements of the plan. All questions were answered to their satisfaction.     I, Catheryn LOISE Slocumb, MD, have reviewed all documentation for this visit. The documentation on 02/14/23 for the exam, diagnosis, procedures, and orders are all accurate and complete.

## 2023-02-14 NOTE — Patient Instructions (Signed)

## 2023-02-15 LAB — CBC
Hematocrit: 38.4 % (ref 34.0–46.6)
Hemoglobin: 12.5 g/dL (ref 11.1–15.9)
MCH: 28.3 pg (ref 26.6–33.0)
MCHC: 32.6 g/dL (ref 31.5–35.7)
MCV: 87 fL (ref 79–97)
Platelets: 279 10*3/uL (ref 150–450)
RBC: 4.42 x10E6/uL (ref 3.77–5.28)
RDW: 12.9 % (ref 11.7–15.4)
WBC: 9.2 10*3/uL (ref 3.4–10.8)

## 2023-02-15 LAB — CMP14+EGFR
ALT: 14 [IU]/L (ref 0–32)
AST: 17 [IU]/L (ref 0–40)
Albumin: 4.3 g/dL (ref 3.9–4.9)
Alkaline Phosphatase: 99 [IU]/L (ref 44–121)
BUN/Creatinine Ratio: 15 (ref 12–28)
BUN: 17 mg/dL (ref 8–27)
Bilirubin Total: 0.7 mg/dL (ref 0.0–1.2)
CO2: 19 mmol/L — ABNORMAL LOW (ref 20–29)
Calcium: 9.8 mg/dL (ref 8.7–10.3)
Chloride: 103 mmol/L (ref 96–106)
Creatinine, Ser: 1.14 mg/dL — ABNORMAL HIGH (ref 0.57–1.00)
Globulin, Total: 2.9 g/dL (ref 1.5–4.5)
Glucose: 84 mg/dL (ref 70–99)
Potassium: 4.1 mmol/L (ref 3.5–5.2)
Sodium: 139 mmol/L (ref 134–144)
Total Protein: 7.2 g/dL (ref 6.0–8.5)
eGFR: 52 mL/min/{1.73_m2} — ABNORMAL LOW (ref >59)

## 2023-02-15 LAB — TSH+FREE T4
Free T4: 1.53 ng/dL (ref 0.82–1.77)
TSH: 1.39 u[IU]/mL (ref 0.450–4.500)

## 2023-02-15 LAB — HEMOGLOBIN A1C
Est. average glucose Bld gHb Est-mCnc: 126 mg/dL
Hgb A1c MFr Bld: 6 % — ABNORMAL HIGH (ref 4.8–5.6)

## 2023-02-15 LAB — LIPID PANEL
Chol/HDL Ratio: 2.8 {ratio} (ref 0.0–4.4)
Cholesterol, Total: 198 mg/dL (ref 100–199)
HDL: 71 mg/dL (ref >39)
LDL Chol Calc (NIH): 97 mg/dL (ref 0–99)
Triglycerides: 175 mg/dL — ABNORMAL HIGH (ref 0–149)
VLDL Cholesterol Cal: 30 mg/dL (ref 5–40)

## 2023-02-15 LAB — MICROALBUMIN / CREATININE URINE RATIO
Creatinine, Urine: 74.4 mg/dL
Microalb/Creat Ratio: 10 mg/g{creat} (ref 0–29)
Microalbumin, Urine: 7.1 ug/mL

## 2023-02-17 ENCOUNTER — Other Ambulatory Visit: Payer: Self-pay | Admitting: Internal Medicine

## 2023-02-17 DIAGNOSIS — N1831 Chronic kidney disease, stage 3a: Secondary | ICD-10-CM

## 2023-02-24 DIAGNOSIS — T8189XA Other complications of procedures, not elsewhere classified, initial encounter: Secondary | ICD-10-CM | POA: Insufficient documentation

## 2023-02-24 NOTE — Assessment & Plan Note (Signed)
 Chronic, well controlled.  EKG performed, NSR w/ low voltage in precordial leads, anterior infarct -age undetermined. She will continue with amlodipine  5mg  daily and lisinopril  20mg  daily. She takes torsemide  as needed for LE edema. She is reminded to follow a low sodium diet.

## 2023-02-24 NOTE — Assessment & Plan Note (Signed)
 Operative note and post-op notes reviewed in detail. Wound is healing well.

## 2023-02-24 NOTE — Assessment & Plan Note (Signed)
 Chronic, currently on levothyroxine daily. Importance of medication compliance was discussed with the patient. I will check thyroid panel and adjust meds as needed.

## 2023-02-24 NOTE — Assessment & Plan Note (Signed)
She is encouraged to initially strive for BMI less than 35 to decrease cardiac risk. Advised to aim for at least 150 minutes of exercise per week.

## 2023-02-24 NOTE — Assessment & Plan Note (Signed)
 Chronic, encouraged to increase daily activity as tolerated. She should continue with statin therapy as well.

## 2023-02-24 NOTE — Assessment & Plan Note (Signed)
 Echo results from Jan 2023 were reviewed in full detail, aortic calcification listed as mild.  Will consider repeat echo within 6-12 months.

## 2023-02-24 NOTE — Assessment & Plan Note (Signed)

## 2023-02-24 NOTE — Assessment & Plan Note (Signed)
 Previous labs reviewed, her A1c has been elevated in the past. I will check an A1c today. Reminded to avoid refined sugars including sugary drinks/foods and processed meats including bacon, sausages and deli meats.

## 2023-02-25 ENCOUNTER — Encounter: Payer: Self-pay | Admitting: Internal Medicine

## 2023-02-26 ENCOUNTER — Ambulatory Visit
Admission: RE | Admit: 2023-02-26 | Discharge: 2023-02-26 | Disposition: A | Discharge status: Home / Self Care | Payer: Commercial Managed Care - PPO | Source: Ambulatory Visit | Attending: Internal Medicine | Admitting: Internal Medicine

## 2023-02-26 DIAGNOSIS — N1831 Chronic kidney disease, stage 3a: Secondary | ICD-10-CM

## 2023-02-26 DIAGNOSIS — N183 Chronic kidney disease, stage 3 unspecified: Secondary | ICD-10-CM | POA: Diagnosis not present

## 2023-03-13 ENCOUNTER — Encounter: Payer: Self-pay | Admitting: Internal Medicine

## 2023-03-16 ENCOUNTER — Other Ambulatory Visit: Payer: Self-pay | Admitting: Internal Medicine

## 2023-03-16 DIAGNOSIS — S81809D Unspecified open wound, unspecified lower leg, subsequent encounter: Secondary | ICD-10-CM

## 2023-03-16 DIAGNOSIS — Z87828 Personal history of other (healed) physical injury and trauma: Secondary | ICD-10-CM

## 2023-03-19 ENCOUNTER — Other Ambulatory Visit (HOSPITAL_COMMUNITY): Payer: Self-pay

## 2023-03-19 ENCOUNTER — Other Ambulatory Visit: Payer: Self-pay | Admitting: Internal Medicine

## 2023-03-19 MED ORDER — TRAMADOL HCL 50 MG PO TABS
50.0000 mg | ORAL_TABLET | Freq: Four times a day (QID) | ORAL | 0 refills | Status: DC | PRN
  Filled 2023-03-19: qty 20, 5d supply, fill #0

## 2023-03-21 ENCOUNTER — Other Ambulatory Visit: Payer: Self-pay | Admitting: *Deleted

## 2023-03-21 ENCOUNTER — Other Ambulatory Visit: Payer: Self-pay | Admitting: Internal Medicine

## 2023-03-21 ENCOUNTER — Other Ambulatory Visit: Payer: Self-pay | Admitting: Nurse Practitioner

## 2023-03-21 DIAGNOSIS — R600 Localized edema: Secondary | ICD-10-CM

## 2023-03-22 ENCOUNTER — Ambulatory Visit (HOSPITAL_COMMUNITY)
Admission: RE | Admit: 2023-03-22 | Discharge: 2023-03-22 | Disposition: A | Discharge status: Home / Self Care | Payer: Commercial Managed Care - PPO | Source: Ambulatory Visit | Attending: Vascular Surgery | Admitting: Vascular Surgery

## 2023-03-22 ENCOUNTER — Other Ambulatory Visit (HOSPITAL_COMMUNITY): Payer: Self-pay

## 2023-03-22 DIAGNOSIS — R600 Localized edema: Secondary | ICD-10-CM | POA: Diagnosis not present

## 2023-03-22 MED ORDER — SIMVASTATIN 20 MG PO TABS
20.0000 mg | ORAL_TABLET | Freq: Every day | ORAL | 1 refills | Status: DC
  Filled 2023-03-22: qty 90, 90d supply, fill #0
  Filled 2023-08-01: qty 90, 90d supply, fill #1

## 2023-03-22 MED ORDER — LISINOPRIL 20 MG PO TABS
20.0000 mg | ORAL_TABLET | Freq: Every day | ORAL | 2 refills | Status: AC
  Filled 2023-03-22: qty 90, 90d supply, fill #0
  Filled 2023-08-01: qty 90, 90d supply, fill #1
  Filled 2023-10-29: qty 90, 90d supply, fill #2

## 2023-03-29 ENCOUNTER — Other Ambulatory Visit: Payer: Self-pay

## 2023-04-09 ENCOUNTER — Ambulatory Visit: Payer: Commercial Managed Care - PPO | Admitting: Physician Assistant

## 2023-04-09 ENCOUNTER — Encounter: Payer: Self-pay | Admitting: Physician Assistant

## 2023-04-09 VITALS — BP 112/62 | HR 77 | Temp 98.7°F | Resp 18 | Ht 62.0 in | Wt 223.0 lb

## 2023-04-09 DIAGNOSIS — L97909 Non-pressure chronic ulcer of unspecified part of unspecified lower leg with unspecified severity: Secondary | ICD-10-CM | POA: Diagnosis not present

## 2023-04-09 DIAGNOSIS — R600 Localized edema: Secondary | ICD-10-CM

## 2023-04-09 DIAGNOSIS — I83008 Varicose veins of unspecified lower extremity with ulcer other part of lower leg: Secondary | ICD-10-CM | POA: Diagnosis not present

## 2023-04-09 NOTE — Progress Notes (Signed)
 [facility]           OF [place]  History and Physical   [name] is a 68 y.o. female who presents with left leg wound.   She was seen by Dr. Myra Gianotti in 2020 for a chronic left leg wound and swelling.  She had a hx of weeping legs. 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  At that time her ABI on the left was 1.3.  Dr. Myra Gianotti had discussed endovenous laser ablation but since her wounds had healed and she was tolerating compression socks, she continued with compression.    She comes in today with 2 week hx of new wound on the lateral aspect of the left lower leg.  She states it really hasn't gotten any better or worse and is not really painful.   She states her right leg has minimal swelling especially if she is wearing her compression.  She denies any claudication or rest pain.  She does not have any wounds on her feet.  She has not had any procedures on her legs.  She wears her compression daily and elevates her legs.   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  She does have family hx with her grandmother having "weeping legs".     The pt is on a statin for cholesterol management.  The pt is not on a daily aspirin.   Other AC:  none The pt is on ACEI for hypertension.   The pt is is on diabetic medication   Tobacco hx:  never  Pt reports she does not have family hx of AAA but in the chart it is noted her grandfather had a AAA.  She had a CT scan in 2018 and was without aneurysm.    Past Medical History:  Diagnosis Date   Arthritis    DOE (dyspnea on exertion)    mild   Heart murmur    Hyperlipidemia    Hypertension    Hypothyroidism    Morbid obesity (HCC)    Pre-diabetes    Schamberg disease     Past Surgical History:  Procedure Laterality Date   BOWEL RESECTION  07/10/2016   Procedure: SMALL BOWEL RESECTION repair of small bowel x 2;  Surgeon: Claud Kelp, MD;  Location: [facility];  Service: General;;   COLON RESECTION SIGMOID  07/10/2016   Procedure: sigmoid colon resection with colostomy ;  Surgeon: Claud Kelp, MD;  Location: [facility];  Service: General;;   COLOSTOMY TAKEDOWN N/A 02/06/2017   Procedure: LAPAROSCOPIC CONVERTED OPEN COLOSTOMY REVISION WITH SMALL BOWEL RESECTION;  Surgeon: Romie Levee, MD;  Location: [facility];  Service: General;  Laterality: N/A;   FLEXIBLE SIGMOIDOSCOPY N/A 07/06/2016   Procedure: Arnell Sieving;  Surgeon: Jeani Hawking, MD;  Location: [facility];  Service: Endoscopy;  Laterality: N/A;   LAPAROSCOPY N/A 07/10/2016   Procedure: LAPAROSCOPY converted to open;  Surgeon: Claud Kelp, MD;  Location: [facility];  Service: General;  Laterality: N/A;   SUTURE REMOVAL N/A 01/08/2023   Procedure: REMOVAL OF FOREIGN OBJECT;  Surgeon: Romie Levee, MD;  Location: [facility] [place];  Service: General;  Laterality: N/A;   TONSILLECTOMY     age 54 yrs   WOUND EXPLORATION N/A 01/08/2023   Procedure: ABDOMINAL WOUND EXPLORATION;  Surgeon: Romie Levee, MD;  Location: [facility] [place];  Service: General;  Laterality: N/A;  Social History   Socioeconomic History   Marital status: Single    Spouse name: Not on file   Number of children: Not on file   Years of education: Not on file   Highest education level: Associate degree: occupational, Scientist, product/process development, or vocational program  Occupational History   Not on file  Tobacco Use   Smoking status: Never   Smokeless tobacco: Never  Vaping Use   Vaping status: Never Used  Substance and Sexual Activity   Alcohol use: No   Drug use: No   Sexual activity: Not on file  Other Topics Concern   Not on file  Social History Narrative   Not on file   Social Drivers of Health   Financial Resource Strain: Low Risk  (05/17/2022)   Overall Financial Resource Strain (CARDIA)    Difficulty of Paying Living Expenses: Not hard at all  Food Insecurity: No Food Insecurity  (05/17/2022)   Hunger Vital Sign    Worried About Running Out of Food in the Last Year: Never true    Ran Out of Food in the Last Year: Never true  Transportation Needs: No Transportation Needs (05/17/2022)   PRAPARE - Administrator, Civil Service (Medical): No    Lack of Transportation (Non-Medical): No  Physical Activity: Unknown (05/17/2022)   Exercise Vital Sign    Days of Exercise per Week: Patient declined    Minutes of Exercise per Session: Not on file  Stress: No Stress Concern Present (05/17/2022)   [employer] of Occupational Health - Occupational Stress Questionnaire    Feeling of Stress : Only a little  Social Connections: Moderately Isolated (05/17/2022)   Social Connection and Isolation Panel [NHANES]    Frequency of Communication with Friends and Family: More than three times a week    Frequency of Social Gatherings with Friends and Family: More than three times a week    Attends Religious Services: 1 to 4 times per year    Active Member of [employer] or Organizations: No    Attends Engineer, structural: Not on file    Marital Status: Never married  Intimate Partner Violence: Not on file    Family History  Problem Relation Age of Onset   Hypertension Mother    Heart Problems Mother        Pacemaker   Heart attack Father        at age 38.   Heart attack Maternal Grandfather 73       Died of AAA   AAA (abdominal aortic aneurysm) Maternal Grandfather    Heart attack Maternal Uncle    Stroke Paternal Aunt    Alzheimer's disease Maternal Grandmother     Current Outpatient Medications  Medication Sig Dispense Refill   amLODipine (NORVASC) 5 MG tablet Take 1 tablet (5 mg total) by mouth daily. (Patient taking differently: Take 5 mg by mouth at bedtime.) 90 tablet 2   Calcium Carbonate-Vitamin D 600-400 MG-UNIT tablet Take 1 tablet by mouth daily.     Cholecalciferol (VITAMIN D) 2000 units tablet Take 4,000 Units by mouth daily.     levothyroxine  (SYNTHROID) 112 MCG tablet Take 1 tablet (112 mcg total) by mouth daily. 90 tablet 1   lisinopril (ZESTRIL) 20 MG tablet Take 1 tablet (20 mg total) by mouth daily. 90 tablet 2   Magnesium 400 MG TABS Take by mouth.     Multiple Vitamins-Minerals (CENTRUM SILVER 50+WOMEN PO) Take by mouth. 1 per day  nystatin powder Apply 1 Application topically 2 (two) times daily as needed. 30 g 0   potassium chloride SA (KLOR-CON M) 20 MEQ tablet Take 1 tablet (20 mEq total) by mouth daily as needed (when taking Torsemide). 90 tablet 1   Semaglutide-Weight Management (WEGOVY) 0.5 MG/0.5ML SOAJ Inject 0.5 mg into the skin once a week. 2 mL 0   simvastatin (ZOCOR) 20 MG tablet Take 1 tablet (20 mg total) by mouth daily. 90 tablet 1   torsemide (DEMADEX) 20 MG tablet Take 1 tablet (20 mg total) by mouth daily as needed (for edema). 90 tablet 1   traMADol (ULTRAM) 50 MG tablet Take 1 tablet (50 mg total) by mouth every 6 (six) hours as needed. 20 tablet 0   No current facility-administered medications for this visit.    Allergies  Allergen Reactions   Latex Rash    REVIEW OF SYSTEMS:   [X]  denotes positive finding, [ ]  denotes negative finding Cardiac  Comments:  Chest pain or chest pressure:    Shortness of breath upon exertion:    Short of breath when lying flat:    Irregular heart rhythm:        Vascular    Pain in calf, thigh, or hip brought on by ambulation:    Pain in feet at night that wakes you up from your sleep:     Blood clot in your veins:    Leg swelling:  x       Pulmonary    Oxygen at home:    Productive cough:     Wheezing:         Neurologic    Sudden weakness in arms or legs:     Sudden numbness in arms or legs:     Sudden onset of difficulty speaking or slurred speech:    Temporary loss of vision in one eye:     Problems with dizziness:         Gastrointestinal    Blood in stool:     Vomited blood:         Genitourinary    Burning when urinating:     Blood in  urine:        Psychiatric    Major depression:         Hematologic    Bleeding problems:    Problems with blood clotting too easily:        Skin    Rashes or ulcers:        Constitutional    Fever or chills:      PHYSICAL EXAMINATION:  Today's Vitals   04/09/23 1316  BP: 112/62  Pulse: 77  Resp: 18  Temp: 98.7 F (37.1 C)  TempSrc: Temporal  SpO2: 96%  Weight: 223 lb (101.2 kg)  Height: 5\' 2"  (1.575 m)  PainSc: 0-No pain   Body mass index is 40.79 kg/m.   General:  WDWN in NAD; vital signs documented above Gait: Not observed HENT: WNL, normocephalic Pulmonary: normal non-labored breathing without wheezing Cardiac: regular HR; without carotid bruits Abdomen: soft, NT, aortic pulse is not palpable Skin: without rashes Vascular Exam/Pulses:  Right Left  Radial 2+ (normal) 2+ (normal)  DP 1+ (weak) monophasic  PT biphasic monophasic   Extremities:    Neurologic: A&O X 3;  moving all extremities equally Psychiatric:  The pt has Normal affect.   Non-Invasive Vascular Imaging:   Venous duplex on 03/22/2023: +--------------+---------+------+-----------+------------+--------+  LEFT  Reflux NoRefluxReflux TimeDiameter cmsComments                          Yes                                   +--------------+---------+------+-----------+------------+--------+  CFV                    yes   >1 second                       +--------------+---------+------+-----------+------------+--------+  Popliteal              yes   >1 second                       +--------------+---------+------+-----------+------------+--------+  GSV at SFJ              yes    >500 ms      0.46              +--------------+---------+------+-----------+------------+--------+  GSV prox thigh          yes    >500 ms      0.91              +--------------+---------+------+-----------+------------+--------+  GSV mid thigh no                             0.78              +--------------+---------+------+-----------+------------+--------+  GSV dist thighno                            0.74              +--------------+---------+------+-----------+------------+--------+  GSV at knee   no                            0.57              +--------------+---------+------+-----------+------------+--------+  GSV prox calf                                       NV        +--------------+---------+------+-----------+------------+--------+  GSV mid calf            yes    >500 ms      0.48              +--------------+---------+------+-----------+------------+--------+  SSV Pop Fossa           yes    >500 ms      0.32              +--------------+---------+------+-----------+------------+--------+  SSV prox calf no                            0.31              +--------------+---------+------+-----------+------------+--------+  SSV mid calf            yes    >500 ms      0.32              +--------------+---------+------+-----------+------------+--------+  AASV O        no                            0.57              +--------------+---------+------+-----------+------------+--------+  AASV P        no                            0.55              +--------------+---------+------+-----------+------------+--------+  AASV M        no                            0.45              +--------------+---------+------+-----------+------------+--------+   Summary:  Left:  - No evidence of deep vein thrombosis seen in the left lower extremity,  from the common femoral through the popliteal veins.  - Venous reflux is noted in the left common femoral vein.  - Venous reflux is noted in the left sapheno-femoral junction.  - Venous reflux is noted in the left greater saphenous vein in the thigh.  - Venous reflux is noted in the left greater saphenous vein in the calf.  - Venous reflux is noted  in the left popliteal vein.  - Venous reflux is noted in the left short saphenous vein.  - No reflux in the AASV visualized     MANDEE PLUTA is a 68 y.o. female who presents with: left lower lateral leg venous wound    -pt does not have palpable pedal pulses in the left foot and doppler flow sounds monophasic.  She has a palpable right DP pulse.  -pt does not have evidence of DVT.  Pt does have venous reflux in the left deep venous system as well as the GSV at the [facility] and proximal thigh and the calf.  The vein measures 0.9cm in the proximal thigh and 0.48cm in the mid calf. -discussed with pt about continuing to wear her compression stockings but in the meantime, we will place an unna boot on the left lower leg.   -discussed the importance of leg elevation and how to elevate properly - pt is advised to elevate their legs and a diagram is given to them to demonstrate for pt to lay flat on their back with knees elevated and slightly bent with their feet higher than their knees, which puts their feet higher than their heart for 15 minutes per day.  If pt cannot lay flat, advised to lay as flat as possible.  -pt is advised to continue as much walking as possible and avoid sitting or standing for long periods of time.  -discussed importance of weight loss and exercise and that water aerobics would also be beneficial.  -handout with recommendations given  -will put in referral to the Wound Care center for weekly unna boot changes.  If we cannot get an appt in a timely manner, will put in referral for [facility] for weekly unna boot changes.  We will have her return here in 4 weeks and at that time, will get ABI.  -discussed with Dr. Chestine Spore and since her pulses are not palpable, RN did not wrap as tightly as we normally would and discussed with pt that if she develops pain with the unna  boot to contact us and remove unna boot.  She expressed understanding.     Doreatha Massed, [facility] [phone]  Clinic MD:  Chestine Spore

## 2023-04-11 ENCOUNTER — Other Ambulatory Visit: Payer: Self-pay

## 2023-04-11 DIAGNOSIS — I83008 Varicose veins of unspecified lower extremity with ulcer other part of lower leg: Secondary | ICD-10-CM

## 2023-04-12 ENCOUNTER — Telehealth: Payer: Self-pay

## 2023-04-12 NOTE — Telephone Encounter (Signed)
 Appointment/Triage:  -pt sent message stating she called the wound center to make an appt for weekly unna boot changes and it would be April before she could be seen.   -per last office visit if wound center was not an option to try [facility] RN to do weekly changes.  Informed that her insurance will not cover that.  -appt booked for Monday to change unna boot per pt request and her work schedule.  Informed pt if possible to remove dressing and wash leg immediately prior to appt.

## 2023-04-15 ENCOUNTER — Ambulatory Visit (INDEPENDENT_AMBULATORY_CARE_PROVIDER_SITE_OTHER): Payer: Commercial Managed Care - PPO | Admitting: Physician Assistant

## 2023-04-15 VITALS — BP 155/86 | HR 84 | Temp 97.3°F | Resp 20

## 2023-04-15 DIAGNOSIS — L97909 Non-pressure chronic ulcer of unspecified part of unspecified lower leg with unspecified severity: Secondary | ICD-10-CM | POA: Diagnosis not present

## 2023-04-15 DIAGNOSIS — I83008 Varicose veins of unspecified lower extremity with ulcer other part of lower leg: Secondary | ICD-10-CM | POA: Diagnosis not present

## 2023-04-15 NOTE — Progress Notes (Signed)
    The patient was evaluated today for Unna boot change.  Her left lower extremity wound appears to be smaller in size today.  She denies any signs of infection.     A new Unna boot was applied.  She can follow-up with our office in 1 week with repeat Unna boot change  Loel Dubonnet, PA-C [facility] [phone]

## 2023-04-23 ENCOUNTER — Encounter

## 2023-04-25 ENCOUNTER — Ambulatory Visit: Payer: Self-pay | Admitting: Physician Assistant

## 2023-04-25 VITALS — BP 132/82 | HR 94 | Temp 99.1°F | Ht 62.0 in | Wt 214.6 lb

## 2023-04-25 DIAGNOSIS — L97229 Non-pressure chronic ulcer of left calf with unspecified severity: Secondary | ICD-10-CM

## 2023-04-25 DIAGNOSIS — I83028 Varicose veins of left lower extremity with ulcer other part of lower leg: Secondary | ICD-10-CM

## 2023-04-25 DIAGNOSIS — I83008 Varicose veins of unspecified lower extremity with ulcer other part of lower leg: Secondary | ICD-10-CM

## 2023-04-25 DIAGNOSIS — L97909 Non-pressure chronic ulcer of unspecified part of unspecified lower leg with unspecified severity: Secondary | ICD-10-CM

## 2023-04-25 NOTE — Progress Notes (Signed)
 Pt returns today for unna boot change.     Today's Vitals   04/25/23 1018 04/25/23 1022  BP:  132/82  Pulse: 94   Temp: 99.1 F (37.3 C)   SpO2: 98%   Weight: 214 lb 9.6 oz (97.3 kg)   Height: 5\' 2"  (1.575 m)   PainSc: 0-No pain 0-No pain   Body mass index is 39.25 kg/m.   Appears unchanged from last visit.  Insurance would not pay for [facility] unna boot changes.   She returned to work today. She has worked for [employer] over 40 years.    She denies any claudication or rest pain.  She does not have palpable pedal pulses but has multiphasic left PT and pero and a faint monophasic AT signal.  She will return on 04/30/2023 for unna boot change.  She has f/u with Dr. Lenell Antu on 05/07/2023 for evaluation with ABI.  She will keep this appt.  If not healing and pending ABI, may need angiogram.   Doreatha Massed, [facility] 04/25/2023 10:50 AM

## 2023-04-29 ENCOUNTER — Other Ambulatory Visit: Payer: Self-pay

## 2023-04-29 DIAGNOSIS — I739 Peripheral vascular disease, unspecified: Secondary | ICD-10-CM

## 2023-04-30 ENCOUNTER — Encounter: Payer: Self-pay | Admitting: Physician Assistant

## 2023-04-30 ENCOUNTER — Ambulatory Visit (INDEPENDENT_AMBULATORY_CARE_PROVIDER_SITE_OTHER): Admitting: Physician Assistant

## 2023-04-30 VITALS — BP 133/75 | HR 85 | Temp 97.1°F | Resp 22 | Ht 62.0 in | Wt 222.0 lb

## 2023-04-30 DIAGNOSIS — I83028 Varicose veins of left lower extremity with ulcer other part of lower leg: Secondary | ICD-10-CM | POA: Diagnosis not present

## 2023-04-30 DIAGNOSIS — L97929 Non-pressure chronic ulcer of unspecified part of left lower leg with unspecified severity: Secondary | ICD-10-CM

## 2023-04-30 DIAGNOSIS — L97909 Non-pressure chronic ulcer of unspecified part of unspecified lower leg with unspecified severity: Secondary | ICD-10-CM

## 2023-04-30 NOTE — Progress Notes (Signed)
 Pt returns today for unna boot change.   Today's Vitals   04/30/23 1258 04/30/23 1301  BP: 133/75   Pulse: 85   Resp: (!) 22   Temp: (!) 97.1 F (36.2 C)   TempSrc: Temporal   SpO2: 93%   Weight: 222 lb (100.7 kg)   Height: 5\' 2"  (1.575 m)   PainSc: 0-No pain 0-No pain   Body mass index is 40.6 kg/m.   Wound has not really changed much despite a few weeks in the unna boot and leg elevation.      She does not have palpable pulses in the left foot but does have multiphasic doppler flow in the left PT and pero and faint AT.   She had returned to work at her last visit.  She works at [employer] and has for over 40 years.   She is scheduled for ABI and see Dr. Lenell Antu on Tuesday for evaluation for possible angiogram given the wound is not really healing.     Doreatha Massed, [facility] 04/30/2023 2:20 PM

## 2023-05-07 ENCOUNTER — Encounter: Payer: Self-pay | Admitting: Vascular Surgery

## 2023-05-07 ENCOUNTER — Ambulatory Visit: Payer: Commercial Managed Care - PPO | Admitting: Vascular Surgery

## 2023-05-07 ENCOUNTER — Ambulatory Visit (HOSPITAL_COMMUNITY)
Admission: RE | Admit: 2023-05-07 | Discharge: 2023-05-07 | Disposition: A | Discharge status: Home / Self Care | Payer: Commercial Managed Care - PPO | Source: Ambulatory Visit | Attending: Vascular Surgery | Admitting: Vascular Surgery

## 2023-05-07 VITALS — BP 151/77 | HR 80 | Temp 98.0°F | Ht 62.0 in | Wt 224.8 lb

## 2023-05-07 DIAGNOSIS — I83008 Varicose veins of unspecified lower extremity with ulcer other part of lower leg: Secondary | ICD-10-CM

## 2023-05-07 DIAGNOSIS — L97909 Non-pressure chronic ulcer of unspecified part of unspecified lower leg with unspecified severity: Secondary | ICD-10-CM | POA: Diagnosis not present

## 2023-05-07 DIAGNOSIS — I739 Peripheral vascular disease, unspecified: Secondary | ICD-10-CM

## 2023-05-07 LAB — VAS US ABI WITH/WO TBI
Left ABI: 1.07
Right ABI: 1.21

## 2023-05-07 NOTE — Progress Notes (Signed)
 VASC[facility]IN SPECIALISTS OF   ASSESSMENT / PLAN: 68 y.o. female with venous stasis ulceration of the left lateral calf.  Recommend continued medicated compression wraps (Unna boot) until wound is healed.  No evidence of hemodynamically significant peripheral arterial disease today.  CHIEF COMPLAINT: Venous ulcer  HISTORY OF PRESENT ILLNESS: [name] is a 68 y.o. female who returns to clinic for follow-up of venous stasis ulceration.  The ulcer has been slow to heal and so an ankle-brachial index was recommended to evaluate she did not have a "mixed" ulcer.  The patient has no symptoms of peripheral arterial disease.  I do not think she walks fast reported to claudicate.  She has no rest pain.  She has no ischemic ulceration about the feet.  We reviewed her noninvasive testing in detail today.  I reapplied an Radio broadcast assistant   Past Medical History:  Diagnosis Date   Arthritis    DOE (dyspnea on exertion)    mild   Heart murmur    Hyperlipidemia    Hypertension    Hypothyroidism    Morbid obesity (HCC)    Pre-diabetes    Schamberg disease     Past Surgical History:  Procedure Laterality Date   BOWEL RESECTION  07/10/2016   Procedure: SMALL BOWEL RESECTION repair of small bowel x 2;  Surgeon: Claud Kelp, MD;  Location: [facility];  Service: General;;   COLON RESECTION SIGMOID  07/10/2016   Procedure: sigmoid colon resection with colostomy ;  Surgeon: Claud Kelp, MD;  Location: [facility];  Service: General;;   COLOSTOMY TAKEDOWN N/A 02/06/2017   Procedure: LAPAROSCOPIC CONVERTED OPEN COLOSTOMY REVISION WITH SMALL BOWEL RESECTION;  Surgeon: Romie Levee, MD;  Location: [facility];  Service: General;  Laterality: N/A;   FLEXIBLE SIGMOIDOSCOPY N/A 07/06/2016   Procedure: Arnell Sieving;  Surgeon: Jeani Hawking, MD;  Location: [facility];  Service: Endoscopy;  Laterality: N/A;   LAPAROSCOPY N/A 07/10/2016   Procedure: LAPAROSCOPY converted to open;  Surgeon:  Claud Kelp, MD;  Location: [facility];  Service: General;  Laterality: N/A;   SUTURE REMOVAL N/A 01/08/2023   Procedure: REMOVAL OF FOREIGN OBJECT;  Surgeon: Romie Levee, MD;  Location: [facility];  Service: General;  Laterality: N/A;   TONSILLECTOMY     age 59 yrs   WOUND EXPLORATION N/A 01/08/2023   Procedure: ABDOMINAL WOUND EXPLORATION;  Surgeon: Romie Levee, MD;  Location: [facility];  Service: General;  Laterality: N/A;    Family History  Problem Relation Age of Onset   Hypertension Mother    Heart Problems Mother        Pacemaker   Heart attack Father        at age 38.   Heart attack Maternal Grandfather 66       Died of AAA   AAA (abdominal aortic aneurysm) Maternal Grandfather    Heart attack Maternal Uncle    Stroke Paternal Aunt    Alzheimer's disease Maternal Grandmother     Social History   Socioeconomic History   Marital status: Single    Spouse name: Not on file   Number of children: Not on file   Years of education: Not on file   Highest education level: Associate degree: occupational, Scientist, product/process development, or vocational program  Occupational History   Not on file  Tobacco Use   Smoking status: Never   Smokeless tobacco: Never  Vaping Use   Vaping status: Never Used  Substance and Sexual Activity  Alcohol use: No   Drug use: No   Sexual activity: Not on file  Other Topics Concern   Not on file  Social History Narrative   Not on file   Social Drivers of Health   Financial Resource Strain: Low Risk  (05/17/2022)   Overall Financial Resource Strain (CARDIA)    Difficulty of Paying Living Expenses: Not hard at all  Food Insecurity: No Food Insecurity (05/17/2022)   Hunger Vital Sign    Worried About Running Out of Food in the Last Year: Never true    Ran Out of Food in the Last Year: Never true  Transportation Needs: No Transportation Needs (05/17/2022)   PRAPARE - Administrator, Civil Service (Medical): No     Lack of Transportation (Non-Medical): No  Physical Activity: Unknown (05/17/2022)   Exercise Vital Sign    Days of Exercise per Week: Patient declined    Minutes of Exercise per Session: Not on file  Stress: No Stress Concern Present (05/17/2022)   [employer] of Occupational Health - Occupational Stress Questionnaire    Feeling of Stress : Only a little  Social Connections: Moderately Isolated (05/17/2022)   Social Connection and Isolation Panel [NHANES]    Frequency of Communication with Friends and Family: More than three times a week    Frequency of Social Gatherings with Friends and Family: More than three times a week    Attends Religious Services: 1 to 4 times per year    Active Member of [employer] or Organizations: No    Attends Engineer, structural: Not on file    Marital Status: Never married  Intimate Partner Violence: Not on file    Allergies  Allergen Reactions   Latex Rash    Current Outpatient Medications  Medication Sig Dispense Refill   amLODipine (NORVASC) 5 MG tablet Take 1 tablet (5 mg total) by mouth daily. (Patient taking differently: Take 5 mg by mouth at bedtime.) 90 tablet 2   Calcium Carbonate-Vitamin D 600-400 MG-UNIT tablet Take 1 tablet by mouth daily.     Cholecalciferol (VITAMIN D) 2000 units tablet Take 4,000 Units by mouth daily.     levothyroxine (SYNTHROID) 112 MCG tablet Take 1 tablet (112 mcg total) by mouth daily. 90 tablet 1   lisinopril (ZESTRIL) 20 MG tablet Take 1 tablet (20 mg total) by mouth daily. 90 tablet 2   Magnesium 400 MG TABS Take by mouth.     Multiple Vitamins-Minerals (CENTRUM SILVER 50+WOMEN PO) Take by mouth. 1 per day     nystatin powder Apply 1 Application topically 2 (two) times daily as needed. 30 g 0   potassium chloride SA (KLOR-CON M) 20 MEQ tablet Take 1 tablet (20 mEq total) by mouth daily as needed (when taking Torsemide). 90 tablet 1   simvastatin (ZOCOR) 20 MG tablet Take 1 tablet (20 mg total) by mouth  daily. 90 tablet 1   torsemide (DEMADEX) 20 MG tablet Take 1 tablet (20 mg total) by mouth daily as needed (for edema). 90 tablet 1   traMADol (ULTRAM) 50 MG tablet Take 1 tablet (50 mg total) by mouth every 6 (six) hours as needed. 20 tablet 0   No current facility-administered medications for this visit.    PHYSICAL EXAM Vitals:   05/07/23 1357  BP: (!) 151/77  Pulse: 80  Temp: 98 F (36.7 C)  SpO2: 99%  Weight: 224 lb 12.8 oz (102 kg)  Height: 5\' 2"  (1.575 m)   Elderly  woman in no distress Regular rate and rhythm Unlabored breathing Left lateral leg venous stasis ulcer about the size of a quarter which appears to be healing compared to prior photographs No palpable pedal pulses   PERTINENT LABORATORY AND RADIOLOGIC DATA  Most recent CBC    Latest Ref Rng & Units 02/14/2023   10:11 AM 01/08/2023   11:54 AM 10/01/2022    4:02 PM  CBC  WBC 3.4 - 10.8 x10E3/uL 9.2   7.5   Hemoglobin 11.1 - 15.9 g/dL 96.0  45.4  09.8   Hematocrit 34.0 - 46.6 % 38.4  37.0  35.4   Platelets 150 - 450 x10E3/uL 279   266      Most recent CMP    Latest Ref Rng & Units 02/14/2023   10:11 AM 01/08/2023   11:54 AM 10/01/2022    4:02 PM  CMP  Glucose 70 - 99 mg/dL 84  92  99   BUN 8 - 27 mg/dL 17  16  17    Creatinine 0.57 - 1.00 mg/dL 1.19  1.47  8.29   Sodium 134 - 144 mmol/L 139  143  142   Potassium 3.5 - 5.2 mmol/L 4.1  3.9  4.7   Chloride 96 - 106 mmol/L 103  108  105   CO2 20 - 29 mmol/L 19   23   Calcium 8.7 - 10.3 mg/dL 9.8   9.6   Total Protein 6.0 - 8.5 g/dL 7.2     Total Bilirubin 0.0 - 1.2 mg/dL 0.7     Alkaline Phos 44 - 121 IU/L 99     AST 0 - 40 IU/L 17     ALT 0 - 32 IU/L 14       Renal function CrCl cannot be calculated (Patient's most recent lab result is older than the maximum 21 days allowed.).  Hgb A1c MFr Bld (%)  Date Value  02/14/2023 6.0 (H)    LDL Chol Calc (NIH)  Date Value Ref Range Status  02/14/2023 97 0 - 99 mg/dL Final      +-------+-----------+-----------+------------+------------+  ABI/TBIToday's ABIToday's TBIPrevious ABIPrevious TBI  +-------+-----------+-----------+------------+------------+  Right 1.21       1.11                                 +-------+-----------+-----------+------------+------------+  Left  1.07       0.95                                 +-------+-----------+-----------+------------+------------+    Rande Brunt. Lenell Antu, MD [facility] Phone Number: [phone] 05/07/2023 4:32 PM   Total time spent on preparing this encounter including chart review, data review, collecting history, examining the patient, coordinating care for this established patient, 30 minutes.  Portions of this report may have been transcribed using voice recognition software.  Every effort has been made to ensure accuracy; however, inadvertent computerized transcription errors may still be present.

## 2023-05-28 ENCOUNTER — Encounter (HOSPITAL_BASED_OUTPATIENT_CLINIC_OR_DEPARTMENT_OTHER): Payer: Commercial Managed Care - PPO | Attending: General Surgery | Admitting: General Surgery

## 2023-05-28 DIAGNOSIS — I87332 Chronic venous hypertension (idiopathic) with ulcer and inflammation of left lower extremity: Secondary | ICD-10-CM | POA: Diagnosis not present

## 2023-05-28 DIAGNOSIS — L97822 Non-pressure chronic ulcer of other part of left lower leg with fat layer exposed: Secondary | ICD-10-CM | POA: Diagnosis not present

## 2023-06-04 ENCOUNTER — Encounter (HOSPITAL_BASED_OUTPATIENT_CLINIC_OR_DEPARTMENT_OTHER): Admitting: General Surgery

## 2023-06-04 DIAGNOSIS — L97822 Non-pressure chronic ulcer of other part of left lower leg with fat layer exposed: Secondary | ICD-10-CM | POA: Diagnosis not present

## 2023-06-04 DIAGNOSIS — I87332 Chronic venous hypertension (idiopathic) with ulcer and inflammation of left lower extremity: Secondary | ICD-10-CM | POA: Diagnosis not present

## 2023-06-12 ENCOUNTER — Encounter (HOSPITAL_BASED_OUTPATIENT_CLINIC_OR_DEPARTMENT_OTHER): Admitting: General Surgery

## 2023-06-12 DIAGNOSIS — I87332 Chronic venous hypertension (idiopathic) with ulcer and inflammation of left lower extremity: Secondary | ICD-10-CM | POA: Diagnosis not present

## 2023-06-12 DIAGNOSIS — L97822 Non-pressure chronic ulcer of other part of left lower leg with fat layer exposed: Secondary | ICD-10-CM | POA: Diagnosis not present

## 2023-06-17 ENCOUNTER — Other Ambulatory Visit (HOSPITAL_COMMUNITY): Payer: Self-pay

## 2023-06-17 MED ORDER — DOXYCYCLINE HYCLATE 100 MG PO CAPS
100.0000 mg | ORAL_CAPSULE | Freq: Two times a day (BID) | ORAL | 0 refills | Status: AC
  Filled 2023-06-17: qty 28, 14d supply, fill #0

## 2023-06-19 ENCOUNTER — Encounter (HOSPITAL_BASED_OUTPATIENT_CLINIC_OR_DEPARTMENT_OTHER): Attending: General Surgery | Admitting: General Surgery

## 2023-06-19 DIAGNOSIS — L97822 Non-pressure chronic ulcer of other part of left lower leg with fat layer exposed: Secondary | ICD-10-CM | POA: Diagnosis not present

## 2023-06-19 DIAGNOSIS — B9562 Methicillin resistant Staphylococcus aureus infection as the cause of diseases classified elsewhere: Secondary | ICD-10-CM | POA: Diagnosis not present

## 2023-06-19 DIAGNOSIS — I87332 Chronic venous hypertension (idiopathic) with ulcer and inflammation of left lower extremity: Secondary | ICD-10-CM | POA: Diagnosis not present

## 2023-06-26 ENCOUNTER — Encounter (HOSPITAL_BASED_OUTPATIENT_CLINIC_OR_DEPARTMENT_OTHER): Admitting: Internal Medicine

## 2023-06-26 DIAGNOSIS — L97822 Non-pressure chronic ulcer of other part of left lower leg with fat layer exposed: Secondary | ICD-10-CM | POA: Diagnosis not present

## 2023-06-26 DIAGNOSIS — I890 Lymphedema, not elsewhere classified: Secondary | ICD-10-CM | POA: Diagnosis not present

## 2023-06-26 DIAGNOSIS — I87332 Chronic venous hypertension (idiopathic) with ulcer and inflammation of left lower extremity: Secondary | ICD-10-CM | POA: Diagnosis not present

## 2023-07-03 ENCOUNTER — Encounter (HOSPITAL_BASED_OUTPATIENT_CLINIC_OR_DEPARTMENT_OTHER): Admitting: General Surgery

## 2023-07-03 DIAGNOSIS — I87332 Chronic venous hypertension (idiopathic) with ulcer and inflammation of left lower extremity: Secondary | ICD-10-CM | POA: Diagnosis not present

## 2023-07-03 DIAGNOSIS — L97822 Non-pressure chronic ulcer of other part of left lower leg with fat layer exposed: Secondary | ICD-10-CM | POA: Diagnosis not present

## 2023-07-10 ENCOUNTER — Encounter (HOSPITAL_BASED_OUTPATIENT_CLINIC_OR_DEPARTMENT_OTHER): Admitting: General Surgery

## 2023-07-10 DIAGNOSIS — I87332 Chronic venous hypertension (idiopathic) with ulcer and inflammation of left lower extremity: Secondary | ICD-10-CM | POA: Diagnosis not present

## 2023-07-10 DIAGNOSIS — L97822 Non-pressure chronic ulcer of other part of left lower leg with fat layer exposed: Secondary | ICD-10-CM | POA: Diagnosis not present

## 2023-07-18 ENCOUNTER — Encounter (HOSPITAL_BASED_OUTPATIENT_CLINIC_OR_DEPARTMENT_OTHER): Attending: General Surgery | Admitting: General Surgery

## 2023-07-18 DIAGNOSIS — I87332 Chronic venous hypertension (idiopathic) with ulcer and inflammation of left lower extremity: Secondary | ICD-10-CM | POA: Diagnosis not present

## 2023-07-18 DIAGNOSIS — L97822 Non-pressure chronic ulcer of other part of left lower leg with fat layer exposed: Secondary | ICD-10-CM | POA: Diagnosis not present

## 2023-07-25 ENCOUNTER — Encounter (HOSPITAL_BASED_OUTPATIENT_CLINIC_OR_DEPARTMENT_OTHER): Admitting: General Surgery

## 2023-07-25 ENCOUNTER — Other Ambulatory Visit: Payer: Self-pay | Admitting: Internal Medicine

## 2023-07-25 ENCOUNTER — Other Ambulatory Visit (HOSPITAL_COMMUNITY): Payer: Self-pay

## 2023-07-25 DIAGNOSIS — I890 Lymphedema, not elsewhere classified: Secondary | ICD-10-CM | POA: Diagnosis not present

## 2023-07-25 DIAGNOSIS — L97822 Non-pressure chronic ulcer of other part of left lower leg with fat layer exposed: Secondary | ICD-10-CM | POA: Diagnosis not present

## 2023-07-25 DIAGNOSIS — I87332 Chronic venous hypertension (idiopathic) with ulcer and inflammation of left lower extremity: Secondary | ICD-10-CM | POA: Diagnosis not present

## 2023-07-25 MED ORDER — LEVOTHYROXINE SODIUM 112 MCG PO TABS
112.0000 ug | ORAL_TABLET | Freq: Every day | ORAL | 1 refills | Status: DC
  Filled 2023-08-01: qty 90, 90d supply, fill #0
  Filled 2023-10-29: qty 90, 90d supply, fill #1

## 2023-08-01 ENCOUNTER — Encounter (HOSPITAL_BASED_OUTPATIENT_CLINIC_OR_DEPARTMENT_OTHER): Admitting: General Surgery

## 2023-08-01 ENCOUNTER — Other Ambulatory Visit (HOSPITAL_COMMUNITY): Payer: Self-pay

## 2023-08-01 DIAGNOSIS — I87332 Chronic venous hypertension (idiopathic) with ulcer and inflammation of left lower extremity: Secondary | ICD-10-CM | POA: Diagnosis not present

## 2023-08-01 DIAGNOSIS — L97822 Non-pressure chronic ulcer of other part of left lower leg with fat layer exposed: Secondary | ICD-10-CM | POA: Diagnosis not present

## 2023-08-02 ENCOUNTER — Other Ambulatory Visit (HOSPITAL_COMMUNITY): Payer: Self-pay

## 2023-08-09 ENCOUNTER — Encounter (HOSPITAL_BASED_OUTPATIENT_CLINIC_OR_DEPARTMENT_OTHER): Admitting: General Surgery

## 2023-08-09 DIAGNOSIS — I87332 Chronic venous hypertension (idiopathic) with ulcer and inflammation of left lower extremity: Secondary | ICD-10-CM | POA: Diagnosis not present

## 2023-08-09 DIAGNOSIS — L97822 Non-pressure chronic ulcer of other part of left lower leg with fat layer exposed: Secondary | ICD-10-CM | POA: Diagnosis not present

## 2023-08-14 ENCOUNTER — Encounter: Payer: Self-pay | Admitting: Internal Medicine

## 2023-08-14 ENCOUNTER — Ambulatory Visit: Payer: Commercial Managed Care - PPO | Admitting: Internal Medicine

## 2023-08-14 ENCOUNTER — Other Ambulatory Visit (HOSPITAL_COMMUNITY): Payer: Self-pay

## 2023-08-14 VITALS — BP 126/70 | HR 73 | Temp 97.9°F | Ht 62.0 in | Wt 228.4 lb

## 2023-08-14 DIAGNOSIS — I119 Hypertensive heart disease without heart failure: Secondary | ICD-10-CM

## 2023-08-14 DIAGNOSIS — N1831 Chronic kidney disease, stage 3a: Secondary | ICD-10-CM

## 2023-08-14 DIAGNOSIS — I129 Hypertensive chronic kidney disease with stage 1 through stage 4 chronic kidney disease, or unspecified chronic kidney disease: Secondary | ICD-10-CM

## 2023-08-14 DIAGNOSIS — I83009 Varicose veins of unspecified lower extremity with ulcer of unspecified site: Secondary | ICD-10-CM | POA: Diagnosis not present

## 2023-08-14 DIAGNOSIS — E2839 Other primary ovarian failure: Secondary | ICD-10-CM

## 2023-08-14 DIAGNOSIS — R7309 Other abnormal glucose: Secondary | ICD-10-CM | POA: Diagnosis not present

## 2023-08-14 DIAGNOSIS — E039 Hypothyroidism, unspecified: Secondary | ICD-10-CM | POA: Diagnosis not present

## 2023-08-14 DIAGNOSIS — I358 Other nonrheumatic aortic valve disorders: Secondary | ICD-10-CM

## 2023-08-14 DIAGNOSIS — I739 Peripheral vascular disease, unspecified: Secondary | ICD-10-CM

## 2023-08-14 DIAGNOSIS — Z6841 Body Mass Index (BMI) 40.0 and over, adult: Secondary | ICD-10-CM

## 2023-08-14 DIAGNOSIS — E559 Vitamin D deficiency, unspecified: Secondary | ICD-10-CM

## 2023-08-14 DIAGNOSIS — Z8249 Family history of ischemic heart disease and other diseases of the circulatory system: Secondary | ICD-10-CM

## 2023-08-14 DIAGNOSIS — M1611 Unilateral primary osteoarthritis, right hip: Secondary | ICD-10-CM | POA: Diagnosis not present

## 2023-08-14 DIAGNOSIS — L97909 Non-pressure chronic ulcer of unspecified part of unspecified lower leg with unspecified severity: Secondary | ICD-10-CM

## 2023-08-14 DIAGNOSIS — E66813 Obesity, class 3: Secondary | ICD-10-CM

## 2023-08-14 MED ORDER — TORSEMIDE 20 MG PO TABS
20.0000 mg | ORAL_TABLET | Freq: Every day | ORAL | 1 refills | Status: AC | PRN
  Filled 2023-08-14 – 2023-09-11 (×2): qty 90, 90d supply, fill #0

## 2023-08-14 MED ORDER — SIMVASTATIN 20 MG PO TABS
20.0000 mg | ORAL_TABLET | Freq: Every day | ORAL | 1 refills | Status: AC
  Filled 2023-08-14 – 2023-10-29 (×2): qty 90, 90d supply, fill #0

## 2023-08-14 MED ORDER — AMLODIPINE BESYLATE 5 MG PO TABS
5.0000 mg | ORAL_TABLET | Freq: Every evening | ORAL | 2 refills | Status: DC
  Filled 2023-08-14: qty 90, 90d supply, fill #0

## 2023-08-14 MED ORDER — POTASSIUM CHLORIDE CRYS ER 20 MEQ PO TBCR
20.0000 meq | EXTENDED_RELEASE_TABLET | Freq: Every day | ORAL | 1 refills | Status: AC | PRN
  Filled 2023-08-14 – 2023-09-11 (×2): qty 90, 90d supply, fill #0

## 2023-08-14 NOTE — Assessment & Plan Note (Signed)
 Chronic, she is encouraged to stay well hydrated, avoid NSAIDs and keep BP controlled to prevent progression of CKD.

## 2023-08-14 NOTE — Assessment & Plan Note (Signed)
 Previous labs reviewed, her A1c has been elevated in the past. I will check an A1c today. Reminded to avoid refined sugars including sugary drinks/foods and processed meats including bacon, sausages and deli meats.

## 2023-08-14 NOTE — Progress Notes (Signed)
 I,Victoria T Emmitt, CMA,acting as a Neurosurgeon for Erin LOISE Slocumb, MD.,have documented all relevant documentation on the behalf of Erin LOISE Slocumb, MD,as directed by  Erin LOISE Slocumb, MD while in the presence of Erin LOISE Slocumb, MD.  Subjective:  Patient ID: [name] , female    DOB: [birth year] , 68 y.o.   MRN: [id number]  Chief Complaint  Patient presents with   Hypertension    Patient presents today for bp & thyroid  follow up. She reports compliance with medications. Denies headache, chest pain & sob.    Hypothyroidism    HPI Discussed the use of AI scribe software for clinical note transcription with the patient, who gave verbal consent to proceed.  History of Present Illness [name] is a 68 year old female who presents for a blood pressure and thyroid  check.  She has chronic right hip pain on her dominant side, and surgery has been suggested. However, she needs to reduce her BMI to qualify for the procedure.  She has been dealing with chronic venous ulcers on her leg, which have required regular visits to a wound care specialist. She has been using Unna boots and completed a 14-day course of antibiotics. There is a family history of similar issues, with her grandmother and mother having experienced them.  Her current medications include amlodipine  5 mg daily, levothyroxine  112 mcg, lisinopril  20 mg, a multivitamin, potassium as needed, simvastatin  20 mg, and torsemide  as needed. She recently picked up a 90-day supply of amlodipine .  She discusses her work situation, mentioning the need for accommodations due to her use of a walker and the potential need for FMLA paperwork to cover her medical conditions.   Hypertension This is a chronic problem. The current episode started more than 1 year ago. The problem has been gradually improving since onset. The problem is controlled. Pertinent negatives include no blurred vision, chest pain, palpitations or shortness of breath.  Risk factors for coronary artery disease include obesity, sedentary lifestyle and post-menopausal state. Compliance problems include exercise.      Past Medical History:  Diagnosis Date   Arthritis    DOE (dyspnea on exertion)    mild   Heart murmur    Hyperlipidemia    Hypertension    Hypothyroidism    Morbid obesity (HCC)    Pre-diabetes    Schamberg disease      Family History  Problem Relation Age of Onset   Hypertension Mother    Heart Problems Mother        Pacemaker   Heart attack Father        at age 24.   Heart attack Maternal Grandfather 70       Died of AAA   AAA (abdominal aortic aneurysm) Maternal Grandfather    Heart attack Maternal Uncle    Stroke Paternal Aunt    Alzheimer's disease Maternal Grandmother      Current Outpatient Medications:    Calcium Carbonate-Vitamin D  600-400 MG-UNIT tablet, Take 1 tablet by mouth daily., Disp: , Rfl:    Cholecalciferol (VITAMIN D ) 2000 units tablet, Take 4,000 Units by mouth daily., Disp: , Rfl:    levothyroxine  (SYNTHROID ) 112 MCG tablet, Take 1 tablet (112 mcg total) by mouth daily., Disp: 90 tablet, Rfl: 1   lisinopril  (ZESTRIL ) 20 MG tablet, Take 1 tablet (20 mg total) by mouth daily., Disp: 90 tablet, Rfl: 2   Magnesium  400 MG TABS, Take by mouth., Disp: , Rfl:    Multiple  Vitamins-Minerals (CENTRUM SILVER 50+WOMEN PO), Take by mouth. 1 per day, Disp: , Rfl:    nystatin  powder, Apply 1 Application topically 2 (two) times daily as needed., Disp: 30 g, Rfl: 0   traMADol  (ULTRAM ) 50 MG tablet, Take 1 tablet (50 mg total) by mouth every 6 (six) hours as needed., Disp: 20 tablet, Rfl: 0   amLODipine  (NORVASC ) 5 MG tablet, Take 1 tablet (5 mg total) by mouth at bedtime., Disp: 90 tablet, Rfl: 2   potassium chloride  SA (KLOR-CON  M) 20 MEQ tablet, Take 1 tablet (20 mEq total) by mouth daily as needed (when taking Torsemide )., Disp: 90 tablet, Rfl: 1   simvastatin  (ZOCOR ) 20 MG tablet, Take 1 tablet (20 mg total) by mouth  daily., Disp: 90 tablet, Rfl: 1   torsemide  (DEMADEX ) 20 MG tablet, Take 1 tablet (20 mg total) by mouth daily as needed (for edema)., Disp: 90 tablet, Rfl: 1   Allergies  Allergen Reactions   Latex Rash     Review of Systems  Constitutional: Negative.   Eyes:  Negative for blurred vision.  Respiratory: Negative.  Negative for shortness of breath.   Cardiovascular: Negative.  Negative for chest pain and palpitations.  Gastrointestinal: Negative.   Musculoskeletal:  Positive for arthralgias.  Neurological: Negative.   Psychiatric/Behavioral: Negative.       Today's Vitals   08/14/23 0948  BP: 126/70  Pulse: 73  Temp: 97.9 F (36.6 C)  SpO2: 98%  Weight: 228 lb 6.4 oz (103.6 kg)  Height: 5' 2 (1.575 m)   Body mass index is 41.77 kg/m.  Wt Readings from Last 3 Encounters:  08/14/23 228 lb 6.4 oz (103.6 kg)  05/07/23 224 lb 12.8 oz (102 kg)  04/30/23 222 lb (100.7 kg)     Objective:  Physical Exam Vitals and nursing note reviewed.  Constitutional:      Appearance: Normal appearance. She is obese.  HENT:     Head: Normocephalic and atraumatic.  Eyes:     Extraocular Movements: Extraocular movements intact.  Cardiovascular:     Rate and Rhythm: Normal rate and regular rhythm.     Heart sounds: Normal heart sounds.  Pulmonary:     Effort: Pulmonary effort is normal.     Breath sounds: Normal breath sounds.  Musculoskeletal:     Cervical back: Normal range of motion.  Skin:    General: Skin is warm.     Comments: LLE bandage  Neurological:     General: No focal deficit present.     Mental Status: She is alert.  Psychiatric:        Mood and Affect: Mood normal.        Behavior: Behavior normal.         Assessment And Plan:  Hypertensive heart disease without heart failure Assessment & Plan: Chronic, well controlled.  She will continue with amlodipine  5mg  daily and lisinopril  20mg  daily. She takes torsemide  as needed for LE edema. She is reminded to follow  a low sodium diet.   Orders: -     CBC -     CMP14+EGFR -     Lipid panel  Peripheral vascular disease (HCC) Assessment & Plan: Chronic, encouraged to increase daily activity as tolerated. She should continue with statin therapy as well.    Stage 3a chronic kidney disease (HCC) Assessment & Plan: Chronic, she is encouraged to stay well hydrated, avoid NSAIDs and keep BP controlled to prevent progression of CKD.    Orders: -  CMP14+EGFR  Primary hypothyroidism Assessment & Plan: Chronic, currently on levothyroxine  112mcg daily. Importance of medication compliance was discussed with the patient. I will check thyroid  panel and adjust meds as needed.   Orders: -     TSH + free T4  Unilateral primary osteoarthritis, right hip Assessment & Plan: Chronic right hip pain. Surgery considered but BMI reduction needed. - Refer to [facility] for meal plan  and guidance to reduce BMI.   Venous ulcer (HCC) Assessment & Plan: Recurrent venous ulcers managed with weekly debridement and Unna boot application. No blood clots detected. - Continue weekly visits to wound care specialist for debridement and bandage changes.   Aortic valve sclerosis Assessment & Plan: Echo results from Jan 2023 were reviewed in full detail, aortic calcification listed as mild.  Will consider repeat echo within 6-12 months.   Orders: -     ECHOCARDIOGRAM COMPLETE; Future  Other abnormal glucose Assessment & Plan: Previous labs reviewed, her A1c has been elevated in the past. I will check an A1c today. Reminded to avoid refined sugars including sugary drinks/foods and processed meats including bacon, sausages and deli meats.    Orders: -     CMP14+EGFR -     Hemoglobin A1c  Estrogen deficiency -     DG Bone Density; Future  Vitamin D  deficiency disease -     VITAMIN D  25 Hydroxy (Vit-D Deficiency, Fractures)  Class 3 severe obesity due to excess calories with serious comorbidity and  body mass index (BMI) of 40.0 to 44.9 in adult Assessment & Plan: She is encouraged to initially strive for BMI less than 35 to decrease cardiac risk. Advised to aim for at least 150 minutes of exercise per week.   Orders: -     Amb Ref to Medical Weight Management  Family history of heart disease -     Lipoprotein A (LPA)  Other orders -     amLODIPine  Besylate; Take 1 tablet (5 mg total) by mouth at bedtime.  Dispense: 90 tablet; Refill: 2 -     Potassium Chloride  Crys ER; Take 1 tablet (20 mEq total) by mouth daily as needed (when taking Torsemide ).  Dispense: 90 tablet; Refill: 1 -     Torsemide ; Take 1 tablet (20 mg total) by mouth daily as needed (for edema).  Dispense: 90 tablet; Refill: 1 -     Simvastatin ; Take 1 tablet (20 mg total) by mouth daily.  Dispense: 90 tablet; Refill: 1  Workplace Accommodations Requires workplace accommodations due to medical conditions. Discussed FMLA and ADA paperwork. - Request FMLA paperwork from HR. - Request ADA accommodations form from HR. - Complete and submit FMLA and ADA forms with provider's assistance.  Return if symptoms worsen or fail to improve.  Patient was given opportunity to ask questions. Patient verbalized understanding of the plan and was able to repeat key elements of the plan. All questions were answered to their satisfaction.   I, Erin LOISE Slocumb, MD, have reviewed all documentation for this visit. The documentation on 08/14/23 for the exam, diagnosis, procedures, and orders are all accurate and complete.   IF YOU HAVE BEEN REFERRED TO A SPECIALIST, IT MAY TAKE 1-2 WEEKS TO SCHEDULE/PROCESS THE REFERRAL. IF YOU HAVE NOT HEARD FROM US /SPECIALIST IN TWO WEEKS, PLEASE GIVE US  A CALL AT [phone].   THE PATIENT IS ENCOURAGED TO PRACTICE SOCIAL DISTANCING DUE TO THE COVID-19 PANDEMIC.

## 2023-08-14 NOTE — Assessment & Plan Note (Signed)
 Echo results from Jan 2023 were reviewed in full detail, aortic calcification listed as mild.  Will consider repeat echo within 6-12 months.

## 2023-08-14 NOTE — Assessment & Plan Note (Signed)
 Recurrent venous ulcers managed with weekly debridement and Unna boot application. No blood clots detected. - Continue weekly visits to wound care specialist for debridement and bandage changes.

## 2023-08-14 NOTE — Assessment & Plan Note (Signed)
 Chronic, well controlled.  She will continue with amlodipine  5mg  daily and lisinopril  20mg  daily. She takes torsemide  as needed for LE edema. She is reminded to follow a low sodium diet.

## 2023-08-14 NOTE — Assessment & Plan Note (Signed)
She is encouraged to initially strive for BMI less than 35 to decrease cardiac risk. Advised to aim for at least 150 minutes of exercise per week.

## 2023-08-14 NOTE — Assessment & Plan Note (Signed)
 Chronic right hip pain. Surgery considered but BMI reduction needed. - Refer to [facility] for meal plan  and guidance to reduce BMI.

## 2023-08-14 NOTE — Patient Instructions (Signed)
 Hypertension, Adult Hypertension is another name for high blood pressure. High blood pressure forces your heart to work harder to pump blood. This can cause problems over time. There are two numbers in a blood pressure reading. There is a top number (systolic) over a bottom number (diastolic). It is best to have a blood pressure that is below 120/80. What are the causes? The cause of this condition is not known. Some other conditions can lead to high blood pressure. What increases the risk? Some lifestyle factors can make you more likely to develop high blood pressure: Smoking. Not getting enough exercise or physical activity. Being overweight. Having too much fat, sugar, calories, or salt (sodium) in your diet. Drinking too much alcohol. Other risk factors include: Having any of these conditions: Heart disease. Diabetes. High cholesterol. Kidney disease. Obstructive sleep apnea. Having a family history of high blood pressure and high cholesterol. Age. The risk increases with age. Stress. What are the signs or symptoms? High blood pressure may not cause symptoms. Very high blood pressure (hypertensive crisis) may cause: Headache. Fast or uneven heartbeats (palpitations). Shortness of breath. Nosebleed. Vomiting or feeling like you may vomit (nauseous). Changes in how you see. Very bad chest pain. Feeling dizzy. Seizures. How is this treated? This condition is treated by making healthy lifestyle changes, such as: Eating healthy foods. Exercising more. Drinking less alcohol. Your doctor may prescribe medicine if lifestyle changes do not help enough and if: Your top number is above 130. Your bottom number is above 80. Your personal target blood pressure may vary. Follow these instructions at home: Eating and drinking  If told, follow the DASH eating plan. To follow this plan: Fill one half of your plate at each meal with fruits and vegetables. Fill one fourth of your plate  at each meal with whole grains. Whole grains include whole-wheat pasta, brown rice, and whole-grain bread. Eat or drink low-fat dairy products, such as skim milk or low-fat yogurt. Fill one fourth of your plate at each meal with low-fat (lean) proteins. Low-fat proteins include fish, chicken without skin, eggs, beans, and tofu. Avoid fatty meat, cured and processed meat, or chicken with skin. Avoid pre-made or processed food. Limit the amount of salt in your diet to less than 1,500 mg each day. Do not drink alcohol if: Your doctor tells you not to drink. You are pregnant, may be pregnant, or are planning to become pregnant. If you drink alcohol: Limit how much you have to: 0-1 drink a day for women. 0-2 drinks a day for men. Know how much alcohol is in your drink. In the [place] one drink equals one 12 oz bottle of beer (355 mL), one 5 oz glass of wine (148 mL), or one 1 oz glass of hard liquor (44 mL). Lifestyle  Work with your doctor to stay at a healthy weight or to lose weight. Ask your doctor what the best weight is for you. Get at least 30 minutes of exercise that causes your heart to beat faster (aerobic exercise) most days of the week. This may include walking, swimming, or biking. Get at least 30 minutes of exercise that strengthens your muscles (resistance exercise) at least 3 days a week. This may include lifting weights or doing Pilates. Do not smoke or use any products that contain nicotine or tobacco. If you need help quitting, ask your doctor. Check your blood pressure at home as told by your doctor. Keep all follow-up visits. Medicines Take over-the-counter and prescription medicines  only as told by your doctor. Follow directions carefully. Do not skip doses of blood pressure medicine. The medicine does not work as well if you skip doses. Skipping doses also puts you at risk for problems. Ask your doctor about side effects or reactions to medicines that you should watch  for. Contact a doctor if: You think you are having a reaction to the medicine you are taking. You have headaches that keep coming back. You feel dizzy. You have swelling in your ankles. You have trouble with your vision. Get help right away if: You get a very bad headache. You start to feel mixed up (confused). You feel weak or numb. You feel faint. You have very bad pain in your: Chest. Belly (abdomen). You vomit more than once. You have trouble breathing. These symptoms may be an emergency. Get help right away. Call 911. Do not wait to see if the symptoms will go away. Do not drive yourself to the hospital. Summary Hypertension is another name for high blood pressure. High blood pressure forces your heart to work harder to pump blood. For most people, a normal blood pressure is less than 120/80. Making healthy choices can help lower blood pressure. If your blood pressure does not get lower with healthy choices, you may need to take medicine. This information is not intended to replace advice given to you by your health care provider. Make sure you discuss any questions you have with your health care provider. Document Revised: 11/17/2020 Document Reviewed: 11/17/2020 Elsevier Patient Education  2024 [employer].

## 2023-08-14 NOTE — Assessment & Plan Note (Signed)
 Chronic, currently on levothyroxine daily. Importance of medication compliance was discussed with the patient. I will check thyroid panel and adjust meds as needed.

## 2023-08-14 NOTE — Assessment & Plan Note (Signed)
 Chronic, encouraged to increase daily activity as tolerated. She should continue with statin therapy as well.

## 2023-08-15 ENCOUNTER — Encounter (INDEPENDENT_AMBULATORY_CARE_PROVIDER_SITE_OTHER): Payer: Self-pay

## 2023-08-15 ENCOUNTER — Encounter (HOSPITAL_BASED_OUTPATIENT_CLINIC_OR_DEPARTMENT_OTHER): Attending: General Surgery | Admitting: General Surgery

## 2023-08-15 DIAGNOSIS — I87332 Chronic venous hypertension (idiopathic) with ulcer and inflammation of left lower extremity: Secondary | ICD-10-CM | POA: Diagnosis not present

## 2023-08-15 DIAGNOSIS — L97822 Non-pressure chronic ulcer of other part of left lower leg with fat layer exposed: Secondary | ICD-10-CM | POA: Diagnosis not present

## 2023-08-15 LAB — CBC
Hematocrit: 38.5 % (ref 34.0–46.6)
Hemoglobin: 12.6 g/dL (ref 11.1–15.9)
MCH: 29 pg (ref 26.6–33.0)
MCHC: 32.7 g/dL (ref 31.5–35.7)
MCV: 89 fL (ref 79–97)
Platelets: 281 10*3/uL (ref 150–450)
RBC: 4.35 x10E6/uL (ref 3.77–5.28)
RDW: 13.3 % (ref 11.7–15.4)
WBC: 8.4 10*3/uL (ref 3.4–10.8)

## 2023-08-15 LAB — LIPID PANEL
Chol/HDL Ratio: 2.3 ratio (ref 0.0–4.4)
Cholesterol, Total: 164 mg/dL (ref 100–199)
HDL: 71 mg/dL (ref >39)
LDL Chol Calc (NIH): 75 mg/dL (ref 0–99)
Triglycerides: 97 mg/dL (ref 0–149)
VLDL Cholesterol Cal: 18 mg/dL (ref 5–40)

## 2023-08-15 LAB — CMP14+EGFR
ALT: 12 IU/L (ref 0–32)
AST: 20 IU/L (ref 0–40)
Albumin: 4.5 g/dL (ref 3.9–4.9)
Alkaline Phosphatase: 91 IU/L (ref 44–121)
BUN/Creatinine Ratio: 15 (ref 12–28)
BUN: 18 mg/dL (ref 8–27)
Bilirubin Total: 0.5 mg/dL (ref 0.0–1.2)
CO2: 19 mmol/L — ABNORMAL LOW (ref 20–29)
Calcium: 9.4 mg/dL (ref 8.7–10.3)
Chloride: 105 mmol/L (ref 96–106)
Creatinine, Ser: 1.24 mg/dL — ABNORMAL HIGH (ref 0.57–1.00)
Globulin, Total: 2.8 g/dL (ref 1.5–4.5)
Glucose: 91 mg/dL (ref 70–99)
Potassium: 4.5 mmol/L (ref 3.5–5.2)
Sodium: 141 mmol/L (ref 134–144)
Total Protein: 7.3 g/dL (ref 6.0–8.5)
eGFR: 47 mL/min/{1.73_m2} — ABNORMAL LOW (ref >59)

## 2023-08-15 LAB — HEMOGLOBIN A1C
Est. average glucose Bld gHb Est-mCnc: 120 mg/dL
Hgb A1c MFr Bld: 5.8 % — ABNORMAL HIGH (ref 4.8–5.6)

## 2023-08-15 LAB — TSH+FREE T4
Free T4: 1.63 ng/dL (ref 0.82–1.77)
TSH: 1.56 u[IU]/mL (ref 0.450–4.500)

## 2023-08-15 LAB — LIPOPROTEIN A (LPA): Lipoprotein (a): <8.4 nmol/L (ref <75.0)

## 2023-08-15 LAB — VITAMIN D 25 HYDROXY (VIT D DEFICIENCY, FRACTURES): Vit D, 25-Hydroxy: 54.8 ng/mL (ref 30.0–100.0)

## 2023-08-17 ENCOUNTER — Ambulatory Visit: Payer: Self-pay | Admitting: Internal Medicine

## 2023-08-22 ENCOUNTER — Encounter (HOSPITAL_BASED_OUTPATIENT_CLINIC_OR_DEPARTMENT_OTHER): Admitting: General Surgery

## 2023-08-22 DIAGNOSIS — L97822 Non-pressure chronic ulcer of other part of left lower leg with fat layer exposed: Secondary | ICD-10-CM | POA: Diagnosis not present

## 2023-08-22 DIAGNOSIS — I87332 Chronic venous hypertension (idiopathic) with ulcer and inflammation of left lower extremity: Secondary | ICD-10-CM | POA: Diagnosis not present

## 2023-08-23 ENCOUNTER — Other Ambulatory Visit (HOSPITAL_COMMUNITY): Payer: Self-pay

## 2023-08-26 ENCOUNTER — Other Ambulatory Visit (HOSPITAL_COMMUNITY): Payer: Self-pay

## 2023-08-29 ENCOUNTER — Encounter: Payer: Self-pay | Admitting: Internal Medicine

## 2023-08-29 ENCOUNTER — Encounter (HOSPITAL_BASED_OUTPATIENT_CLINIC_OR_DEPARTMENT_OTHER): Admitting: General Surgery

## 2023-08-29 DIAGNOSIS — I87332 Chronic venous hypertension (idiopathic) with ulcer and inflammation of left lower extremity: Secondary | ICD-10-CM | POA: Diagnosis not present

## 2023-08-29 DIAGNOSIS — L97822 Non-pressure chronic ulcer of other part of left lower leg with fat layer exposed: Secondary | ICD-10-CM | POA: Diagnosis not present

## 2023-09-05 ENCOUNTER — Encounter: Payer: Self-pay | Admitting: Internal Medicine

## 2023-09-05 ENCOUNTER — Encounter (HOSPITAL_BASED_OUTPATIENT_CLINIC_OR_DEPARTMENT_OTHER): Admitting: General Surgery

## 2023-09-05 DIAGNOSIS — I87332 Chronic venous hypertension (idiopathic) with ulcer and inflammation of left lower extremity: Secondary | ICD-10-CM | POA: Diagnosis not present

## 2023-09-05 DIAGNOSIS — L97822 Non-pressure chronic ulcer of other part of left lower leg with fat layer exposed: Secondary | ICD-10-CM | POA: Diagnosis not present

## 2023-09-11 ENCOUNTER — Other Ambulatory Visit (HOSPITAL_COMMUNITY): Payer: Self-pay

## 2023-09-12 ENCOUNTER — Encounter (HOSPITAL_BASED_OUTPATIENT_CLINIC_OR_DEPARTMENT_OTHER): Admitting: General Surgery

## 2023-09-12 DIAGNOSIS — I87332 Chronic venous hypertension (idiopathic) with ulcer and inflammation of left lower extremity: Secondary | ICD-10-CM | POA: Diagnosis not present

## 2023-09-12 DIAGNOSIS — L97822 Non-pressure chronic ulcer of other part of left lower leg with fat layer exposed: Secondary | ICD-10-CM | POA: Diagnosis not present

## 2023-09-19 ENCOUNTER — Encounter (HOSPITAL_BASED_OUTPATIENT_CLINIC_OR_DEPARTMENT_OTHER): Attending: General Surgery | Admitting: General Surgery

## 2023-09-19 DIAGNOSIS — L97822 Non-pressure chronic ulcer of other part of left lower leg with fat layer exposed: Secondary | ICD-10-CM | POA: Insufficient documentation

## 2023-09-19 DIAGNOSIS — I87332 Chronic venous hypertension (idiopathic) with ulcer and inflammation of left lower extremity: Secondary | ICD-10-CM | POA: Insufficient documentation

## 2023-09-20 DIAGNOSIS — L97928 Non-pressure chronic ulcer of unspecified part of left lower leg with other specified severity: Secondary | ICD-10-CM | POA: Diagnosis not present

## 2023-09-20 DIAGNOSIS — I87312 Chronic venous hypertension (idiopathic) with ulcer of left lower extremity: Secondary | ICD-10-CM | POA: Diagnosis not present

## 2023-09-25 DIAGNOSIS — Z0289 Encounter for other administrative examinations: Secondary | ICD-10-CM

## 2023-09-26 ENCOUNTER — Encounter (INDEPENDENT_AMBULATORY_CARE_PROVIDER_SITE_OTHER): Payer: Self-pay | Admitting: Nurse Practitioner

## 2023-09-26 ENCOUNTER — Encounter (HOSPITAL_BASED_OUTPATIENT_CLINIC_OR_DEPARTMENT_OTHER): Admitting: General Surgery

## 2023-09-26 ENCOUNTER — Ambulatory Visit (INDEPENDENT_AMBULATORY_CARE_PROVIDER_SITE_OTHER): Admitting: Nurse Practitioner

## 2023-09-26 VITALS — BP 126/54 | HR 60 | Temp 98.3°F | Ht 63.0 in | Wt 220.0 lb

## 2023-09-26 DIAGNOSIS — E782 Mixed hyperlipidemia: Secondary | ICD-10-CM | POA: Diagnosis not present

## 2023-09-26 DIAGNOSIS — Z6841 Body Mass Index (BMI) 40.0 and over, adult: Secondary | ICD-10-CM | POA: Diagnosis not present

## 2023-09-26 DIAGNOSIS — E66813 Obesity, class 3: Secondary | ICD-10-CM

## 2023-09-26 DIAGNOSIS — I83009 Varicose veins of unspecified lower extremity with ulcer of unspecified site: Secondary | ICD-10-CM

## 2023-09-26 DIAGNOSIS — R7303 Prediabetes: Secondary | ICD-10-CM

## 2023-09-26 DIAGNOSIS — N1831 Chronic kidney disease, stage 3a: Secondary | ICD-10-CM | POA: Diagnosis not present

## 2023-09-26 DIAGNOSIS — I87332 Chronic venous hypertension (idiopathic) with ulcer and inflammation of left lower extremity: Secondary | ICD-10-CM | POA: Diagnosis not present

## 2023-09-26 DIAGNOSIS — I10 Essential (primary) hypertension: Secondary | ICD-10-CM

## 2023-09-26 DIAGNOSIS — L97822 Non-pressure chronic ulcer of other part of left lower leg with fat layer exposed: Secondary | ICD-10-CM | POA: Diagnosis not present

## 2023-09-26 DIAGNOSIS — E039 Hypothyroidism, unspecified: Secondary | ICD-10-CM | POA: Diagnosis not present

## 2023-09-26 DIAGNOSIS — I119 Hypertensive heart disease without heart failure: Secondary | ICD-10-CM

## 2023-09-26 DIAGNOSIS — I739 Peripheral vascular disease, unspecified: Secondary | ICD-10-CM | POA: Diagnosis not present

## 2023-09-26 DIAGNOSIS — I1310 Hypertensive heart and chronic kidney disease without heart failure, with stage 1 through stage 4 chronic kidney disease, or unspecified chronic kidney disease: Secondary | ICD-10-CM | POA: Diagnosis not present

## 2023-09-26 DIAGNOSIS — M1611 Unilateral primary osteoarthritis, right hip: Secondary | ICD-10-CM

## 2023-09-26 DIAGNOSIS — K5792 Diverticulitis of intestine, part unspecified, without perforation or abscess without bleeding: Secondary | ICD-10-CM

## 2023-09-26 NOTE — Progress Notes (Signed)
 [address] [place], [place], [place] [address] Office: [phone]  /  Fax: [phone]   Initial Consultation    [name] was seen in clinic today to evaluate for obesity. She is interested in losing weight to improve overall health and reduce the risk of weight related complications. She presents today to review program treatment options, initial physical assessment, and evaluation.     Anthropometrics and Bioimpedance Analysis   Body mass index is 38.97 kg/m. Body Fat Mass : 56.8 % Visceral Fat Mass Rating : 19   Obesity Related Diseases and Complications  [name] has a history of hypertension which is currently well controlled with Lisinopril  20 mg every day and Amlodipine  5 mg at bedtime.  She does take Torsemide  20 mg as needed for edema of lower legs, takes KLOR-CON  20 meq when taking torsemide .Usually will take once or twice a week. She does have peripheral vascular disease and is currently undergoing treatment for chronic venous ulcer at the wound center. She also follows with Vascular Surgery. She has a history of chronic right hip pain due to osteoarthritis and hip replacement has been suggested but requires BMI reduction before she can qualify for the surgery. She is on Tylenol  She does have hypothyroidism and is currently well managed on Levothyroxine  112 mcg every day. She has prediabetes with A1c's ranging from 5.6-6.0. She does have stage 3 a CKD with GFR levels in the 40's and 50's.She is on ACE inhibitor for kidney protection. She is drinking 5 8 ounce glasses a day. She has a history of hyperlipidemia and is currently well controlled with simvastatin  20 mg every day She does have Vit D deficiency and is currently on VitD3 4000 units daily. Last level 54.8 on 08/14/23 Previous diverticulitis with obstruction s/p sigmoid colectomy 06/2016 and colostomy takedown 02/06/2017  Obesity Quality of Life and Psychosocial Complications: Reduced health-related quality of life,  Decrease physical activity and social participation, and increased right hip and knee pain.  Cardiometabolic: Prediabetes and/or insulin resistance, Dyslipidemia or hypercholesterolemia, Hypertension, Chronic kidney disease or obesity glomerulopathy, DOE, and Fatigue  Biomechanical: Osteoarthritis of the knee or hip, GERD, and Impaired mobility and gait dysfunction   Weight Related History  She was referred by: PCP  When asked what they would like to accomplish? She states: Adopt a healthier eating pattern and lifestyle, Improve energy levels and physical activity, Improve existing medical conditions, Need to lose weight for hip replacement, and Improve quality of life  Weight history: Started to notice weight gain in teenage years  Highest weight: 236 2-3 years ago  Contributing factors: family history of obesity, consumption of processed foods, moderate to high levels of stress, reduced physical activity, menopause, multiple weight loss attempts in the past, sedentary job, and need for convenient foods  Prior weight loss attempts: Weight Watchers  Current or previous pharmacotherapy: GLP-1  Response to medication: Lost weight initially but was unable to sustain weight loss, insurance no longer covered but lost 15 pounds.  Current nutrition plan: None  Greatest challenge with dieting: low energy, difficulty maintaining reduced calorie state, and meal preparation and cooking.  Current level of physical activity: None  Barriers to Exercise: energy and orthopedic problems  Readiness and Motivation  On a scale from 0 to 10 How ready are you to make changes to your eating and physical activity to lose weight? 10 How important is it for you to lose weight right now ? 10 How confident are you that you can lose weight if  you try? 8  Past Medical History   Past Medical History:  Diagnosis Date   Arthritis    DOE (dyspnea on exertion)    mild   Heart murmur    Hyperlipidemia     Hypertension    Hypothyroidism    Morbid obesity (HCC)    Pre-diabetes    Schamberg disease      Objective    BP (!) 126/54   Pulse 60   Temp 98.3 F (36.8 C)   Ht 5' 3 (1.6 m)   Wt 220 lb (99.8 kg)   SpO2 100%   BMI 38.97 kg/m  She was weighed on the bioimpedance scale: Body mass index is 38.97 kg/m.    General:  Alert, oriented and cooperative. Patient is in no acute distress.  Respiratory: Normal respiratory effort, no problems with respiration noted   Gait: able to ambulate independently  Mental Status: Normal mood and affect. Normal behavior. Normal judgment and thought content.   Diagnostic Data Reviewed  BMET    Component Value Date/Time   NA 141 08/14/2023 1025   K 4.5 08/14/2023 1025   CL 105 08/14/2023 1025   CO2 19 (L) 08/14/2023 1025   GLUCOSE 91 08/14/2023 1025   GLUCOSE 92 01/08/2023 1154   BUN 18 08/14/2023 1025   CREATININE 1.24 (H) 08/14/2023 1025   CALCIUM 9.4 08/14/2023 1025   GFRNONAA 52 (L) 11/03/2019 1056   GFRAA 60 11/03/2019 1056   Lab Results  Component Value Date   HGBA1C 5.8 (H) 08/14/2023   HGBA1C 5.8 (H) 07/12/2016   No results found for: INSULIN CBC    Component Value Date/Time   WBC 8.4 08/14/2023 1025   WBC 8.8 02/10/2017 0458   RBC 4.35 08/14/2023 1025   RBC 3.18 (L) 02/10/2017 0458   HGB 12.6 08/14/2023 1025   HCT 38.5 08/14/2023 1025   PLT 281 08/14/2023 1025   MCV 89 08/14/2023 1025   MCH 29.0 08/14/2023 1025   MCH 27.0 02/10/2017 0458   MCHC 32.7 08/14/2023 1025   MCHC 31.7 02/10/2017 0458   RDW 13.3 08/14/2023 1025   Iron/TIBC/Ferritin/ %Sat No results found for: IRON, TIBC, FERRITIN, IRONPCTSAT Lipid Panel     Component Value Date/Time   CHOL 164 08/14/2023 1025   TRIG 97 08/14/2023 1025   HDL 71 08/14/2023 1025   CHOLHDL 2.3 08/14/2023 1025   LDLCALC 75 08/14/2023 1025   Hepatic Function Panel     Component Value Date/Time   PROT 7.3 08/14/2023 1025   ALBUMIN  4.5 08/14/2023 1025    AST 20 08/14/2023 1025   ALT 12 08/14/2023 1025   ALKPHOS 91 08/14/2023 1025   BILITOT 0.5 08/14/2023 1025      Component Value Date/Time   TSH 1.560 08/14/2023 1025    Medications  Outpatient Encounter Medications as of 09/26/2023  Medication Sig   amLODipine  (NORVASC ) 5 MG tablet Take 1 tablet (5 mg total) by mouth at bedtime.   Calcium Carbonate-Vitamin D  600-400 MG-UNIT tablet Take 1 tablet by mouth daily.   Cholecalciferol (VITAMIN D ) 2000 units tablet Take 4,000 Units by mouth daily.   levothyroxine  (SYNTHROID ) 112 MCG tablet Take 1 tablet (112 mcg total) by mouth daily.   lisinopril  (ZESTRIL ) 20 MG tablet Take 1 tablet (20 mg total) by mouth daily.   Magnesium  400 MG TABS Take by mouth.   Multiple Vitamins-Minerals (CENTRUM SILVER 50+WOMEN PO) Take by mouth. 1 per day   potassium chloride  SA (KLOR-CON  M) 20 MEQ tablet  Take 1 tablet (20 mEq total) by mouth daily as needed (when taking Torsemide ).   simvastatin  (ZOCOR ) 20 MG tablet Take 1 tablet (20 mg total) by mouth daily.   Sod Picosulfate-Mag Ox-Cit Acd (CLENPIQ ) 10-3.5-12 MG-GM -GM/175ML SOLN 175 ML ORALLY TWICE; Duration: 2   torsemide  (DEMADEX ) 20 MG tablet Take 1 tablet (20 mg total) by mouth daily as needed (for edema).   traMADol  (ULTRAM ) 50 MG tablet Take 1 tablet (50 mg total) by mouth every 6 (six) hours as needed.   [DISCONTINUED] nystatin  powder Apply 1 Application topically 2 (two) times daily as needed.   No facility-administered encounter medications on file as of 09/26/2023.     Assessment and Plan   Peripheral vascular disease (HCC)  Hypertensive heart disease without heart failure  Stage 3a chronic kidney disease (HCC)  Primary hypertension  Primary hypothyroidism  Pre-diabetes  Venous ulcer (HCC)  Unilateral primary osteoarthritis, right hip  Diverticulitis with obstruction s/p sigmoid colectomy May 2018 & colostomy takedown 02/06/2017  Mixed hyperlipidemia  Class 3 severe obesity due to  excess calories with serious comorbidity and body mass index (BMI) of 40.0 to 44.9 in adult       Obesity Treatment and Action Plan:  Patient will work on garnering support from family and friends to begin weight loss journey. Will work on eliminating or reducing the presence of highly palatable, calorie dense foods in the home. Will complete provided nutritional and psychosocial assessment questionnaire before the next appointment. Will be scheduled for indirect calorimetry to determine resting energy expenditure in a fasting state.  This will allow us  to create a reduced calorie, high-protein meal plan to promote loss of fat mass while preserving muscle mass. Counseled on the health benefits of losing 5%-15% of total body weight. Was counseled on nutritional approaches to weight loss and benefits of reducing processed foods and consuming plant-based foods and high quality protein as part of nutritional weight management. Was counseled on pharmacotherapy and role as an adjunct in weight management.   Education and Additional resources  She was weighed on the bioimpedance scale and results were discussed and documented in the synopsis.  We discussed obesity as a progressive, chronic disease and the importance of a more detailed evaluation of all the factors contributing to the disease.  We reviewed the basic principles in obesity management.   We discussed the importance of long term lifestyle changes which include nutrition, exercise and behavioral modification as well as the importance of customizing this to her specific health and social needs.  We reviewed the role of medical interventions including pharmacotherapy and surgical interventions.   We discussed the benefits of reaching a healthier weight to alleviate the symptoms of existing conditions and reduce the risks of the biomechanical, cardiometabolic and psychological effects of obesity.  We reviewed our program approach and  philosophy, which are guided by the four pillars of obesity medicine.  We discussed how to prepare for intake appointment and the importance of fasting and avoidance of stimulants for at least 8 hours prior to indirect calorimetry.  Stephaie D Koebel appears to be in the action stage of change and reports being ready to initiate intensive lifestyle and behavioral modifications as part of their weight loss journey.  Attestation  Reviewed by clinician on day of visit: allergies, medications, problem list, medical history, surgical history, family history, social history, and previous encounter notes pertinent to obesity diagnosis.  I have spent 44 minutes in the care of the patient today including:  21 minutes before the visit reviewing and preparing the chart. 19 minutes face-to-face assessing and reviewing listed medical problems as outlined in obesity care plan, providing nutritional and behavioral counseling on topics outlined in the obesity care plan, independently interpreting test results and goals of care, as described in assessment and plan, reviewing and discussing biometric information and progress, and reviewing latest PCP notes and specialist consultations 4 minutes after the visit updating chart and documentation of encounter.  Dailyn Reith ANP-C

## 2023-09-27 ENCOUNTER — Encounter: Payer: Self-pay | Admitting: Internal Medicine

## 2023-10-03 ENCOUNTER — Encounter (HOSPITAL_BASED_OUTPATIENT_CLINIC_OR_DEPARTMENT_OTHER): Admitting: General Surgery

## 2023-10-03 DIAGNOSIS — I87332 Chronic venous hypertension (idiopathic) with ulcer and inflammation of left lower extremity: Secondary | ICD-10-CM | POA: Diagnosis not present

## 2023-10-03 DIAGNOSIS — L97822 Non-pressure chronic ulcer of other part of left lower leg with fat layer exposed: Secondary | ICD-10-CM | POA: Diagnosis not present

## 2023-10-09 ENCOUNTER — Ambulatory Visit (HOSPITAL_COMMUNITY)
Admission: RE | Admit: 2023-10-09 | Discharge: 2023-10-09 | Disposition: A | Discharge status: Home / Self Care | Source: Ambulatory Visit | Attending: Cardiology | Admitting: Cardiology

## 2023-10-09 DIAGNOSIS — I358 Other nonrheumatic aortic valve disorders: Secondary | ICD-10-CM | POA: Insufficient documentation

## 2023-10-10 ENCOUNTER — Encounter (HOSPITAL_BASED_OUTPATIENT_CLINIC_OR_DEPARTMENT_OTHER): Admitting: General Surgery

## 2023-10-10 DIAGNOSIS — I87332 Chronic venous hypertension (idiopathic) with ulcer and inflammation of left lower extremity: Secondary | ICD-10-CM | POA: Diagnosis not present

## 2023-10-10 DIAGNOSIS — L97822 Non-pressure chronic ulcer of other part of left lower leg with fat layer exposed: Secondary | ICD-10-CM | POA: Diagnosis not present

## 2023-10-10 LAB — ECHOCARDIOGRAM COMPLETE
AR max vel: 1.81 cm2
AV Area VTI: 1.87 cm2
AV Area mean vel: 1.67 cm2
AV Mean grad: 9 mmHg
AV Peak grad: 14 mmHg
Ao pk vel: 1.87 m/s
Area-P 1/2: 2.96 cm2
S' Lateral: 2.8 cm

## 2023-10-15 ENCOUNTER — Encounter (INDEPENDENT_AMBULATORY_CARE_PROVIDER_SITE_OTHER): Payer: Self-pay

## 2023-10-17 ENCOUNTER — Encounter (HOSPITAL_BASED_OUTPATIENT_CLINIC_OR_DEPARTMENT_OTHER): Attending: General Surgery | Admitting: General Surgery

## 2023-10-17 DIAGNOSIS — L97822 Non-pressure chronic ulcer of other part of left lower leg with fat layer exposed: Secondary | ICD-10-CM | POA: Insufficient documentation

## 2023-10-17 DIAGNOSIS — I87332 Chronic venous hypertension (idiopathic) with ulcer and inflammation of left lower extremity: Secondary | ICD-10-CM | POA: Diagnosis not present

## 2023-10-17 DIAGNOSIS — I890 Lymphedema, not elsewhere classified: Secondary | ICD-10-CM | POA: Diagnosis not present

## 2023-10-24 ENCOUNTER — Ambulatory Visit (HOSPITAL_BASED_OUTPATIENT_CLINIC_OR_DEPARTMENT_OTHER): Admitting: General Surgery

## 2023-10-24 ENCOUNTER — Encounter: Payer: Self-pay | Admitting: Internal Medicine

## 2023-10-29 ENCOUNTER — Other Ambulatory Visit (HOSPITAL_COMMUNITY): Payer: Self-pay

## 2023-10-29 ENCOUNTER — Other Ambulatory Visit: Payer: Self-pay

## 2023-10-30 ENCOUNTER — Encounter (INDEPENDENT_AMBULATORY_CARE_PROVIDER_SITE_OTHER): Payer: Self-pay | Admitting: Nurse Practitioner

## 2023-10-30 ENCOUNTER — Ambulatory Visit (INDEPENDENT_AMBULATORY_CARE_PROVIDER_SITE_OTHER): Admitting: Nurse Practitioner

## 2023-10-30 ENCOUNTER — Other Ambulatory Visit: Payer: Self-pay

## 2023-10-30 VITALS — BP 150/76 | HR 83 | Temp 98.0°F | Ht 62.0 in | Wt 226.0 lb

## 2023-10-30 DIAGNOSIS — N1831 Chronic kidney disease, stage 3a: Secondary | ICD-10-CM | POA: Diagnosis not present

## 2023-10-30 DIAGNOSIS — I129 Hypertensive chronic kidney disease with stage 1 through stage 4 chronic kidney disease, or unspecified chronic kidney disease: Secondary | ICD-10-CM | POA: Diagnosis not present

## 2023-10-30 DIAGNOSIS — R0602 Shortness of breath: Secondary | ICD-10-CM | POA: Diagnosis not present

## 2023-10-30 DIAGNOSIS — T733XXA Exhaustion due to excessive exertion, initial encounter: Secondary | ICD-10-CM | POA: Insufficient documentation

## 2023-10-30 DIAGNOSIS — E782 Mixed hyperlipidemia: Secondary | ICD-10-CM

## 2023-10-30 DIAGNOSIS — Z1331 Encounter for screening for depression: Secondary | ICD-10-CM

## 2023-10-30 DIAGNOSIS — E66813 Obesity, class 3: Secondary | ICD-10-CM | POA: Diagnosis not present

## 2023-10-30 DIAGNOSIS — R5383 Other fatigue: Secondary | ICD-10-CM

## 2023-10-30 DIAGNOSIS — I739 Peripheral vascular disease, unspecified: Secondary | ICD-10-CM | POA: Diagnosis not present

## 2023-10-30 DIAGNOSIS — I10 Essential (primary) hypertension: Secondary | ICD-10-CM

## 2023-10-30 DIAGNOSIS — E039 Hypothyroidism, unspecified: Secondary | ICD-10-CM

## 2023-10-30 DIAGNOSIS — R7303 Prediabetes: Secondary | ICD-10-CM

## 2023-10-30 DIAGNOSIS — Z6841 Body Mass Index (BMI) 40.0 and over, adult: Secondary | ICD-10-CM

## 2023-10-30 NOTE — Addendum Note (Signed)
 Addended by: JUDE BRUNET E on: 10/30/2023 12:29 PM   Modules accepted: Orders

## 2023-10-30 NOTE — Progress Notes (Addendum)
 [address] [place],  [place], [place] [address]  Office: [phone]  /  Fax: [phone]   Subjective   Initial Visit  [name] (MR# [id number]) is a 68 y.o. female who presents for evaluation and treatment of obesity and related comorbidities. Current BMI is Body mass index is 41.34 kg/m. [name] has been struggling with her weight for many years and has been unsuccessful in either losing weight, maintaining weight loss, or reaching her healthy weight goal.  [name] is currently in the action stage of change and ready to dedicate time achieving and maintaining a healthier weight. [name] is interested in becoming our patient and working on intensive lifestyle modifications including (but not limited to) diet and exercise for weight loss.  [name] has dealt with weight gain since her teens. She has used Weight Watchers in the past with minmal results of 10-15 pounds weight loss.  She has used Ozempic  in the past for a short time and lost 15 pounds but then lost insurance coverage so stopped medication.  [name] has a history of hypertension which is currently well controlled with Lisinopril  20 mg every day and Amlodipine  5 mg at bedtime.  She does take Torsemide  20 mg as needed for edema of lower legs, takes KLOR-CON  20 meq when taking torsemide .Usually will take once or twice a week. She does have peripheral vascular disease and is currently undergoing treatment for chronic venous ulcer at the wound center. She also follows with Vascular Surgery. She has a history of chronic right hip pain due to osteoarthritis and hip replacement has been suggested but requires BMI reduction before she can qualify for the surgery. She is on Tylenol  She does have hypothyroidism and is currently well managed on Levothyroxine  112 mcg every day. She has prediabetes with A1c's ranging from 5.6-6.0. She does have stage 3 a CKD with GFR levels in the 40's and 50's.She is on ACE inhibitor for kidney protection. She  is drinking 5 8 ounce glasses a day. She has a history of hyperlipidemia and is currently well controlled with simvastatin  20 mg every day She does have Vit D deficiency and is currently on VitD3 4000 units daily. Last level 54.8 on 08/14/23 Previous diverticulitis with obstruction s/p sigmoid colectomy 06/2016 and colostomy takedown 02/06/2017  Weight history:  When asked how their weight has affected their life and health, she states: Contributed to medical problems, Contributed to orthopedic problems or mobility issues, Having fatigue, and Having poor endurance  When asked what else they would like to accomplish? She states: Adopt a healthier eating pattern and lifestyle, Improve energy levels and physical activity, Improve existing medical conditions, Need to lose weight for hip replacement, and Improve quality of life  She starting to note weight gain during : teens.  Life events associated with weight gain include : mom had car accident 17 years ago and died with her great niece in car who survived- depression and weight gain was noticed at that time.   Other contributing factors: family history of obesity, consumption of processed foods, moderate to high levels of stress, reduced physical activity, mental health problems, multiple weight loss attempts in the past, sedentary job, and need for convenient foods.  Their highest weight has been:  236 lbs.- 2-3 years ago  Desired weight: 165  Previous weight-loss programs : Weight Watchers.  Their maximum weight loss was:  10-15 lbs.  Their greatest challenge with dieting: dieting fatigue and difficulty maintaining reduced calorie state.  Current or previous pharmacotherapy: GLP-1.  Response to medication: Lost weight initially but was unable to sustain weight loss and Was cost prohibitive or lost coverage for AOM   Nutritional History:  Current nutrition plan: None.  How many times do you eat outside the home: 5-7 per week  How  often do they skip meals: skips breakfast  What beverages do they drink: water, regular soda , juice, sweet tea , and milk.   Use of artificial sweetners : No  Food intolerances or dislikes: spicy food.  Food triggers: Stress and Boredom.  Food cravings: popcorn  Do they struggle with excessive hunger or portion control : Yes    Physical Activity:  Current level of physical activity: None  Barriers to Exercise: orthopedic problems and chronic pain   Past medical history includes:   Past Medical History:  Diagnosis Date   Arthritis    Bilateral swelling of feet    Constipation    DOE (dyspnea on exertion)    mild   Dyspnea    on exertion   Heart burn    Heart murmur    Hyperlipidemia    Hypertension    Hypothyroidism    Joint pain    Kidney disease    stage 3 chronic   Morbid obesity (HCC)    Osteoarthritis    Osteoporosis    Pre-diabetes    Prediabetes    Schamberg disease    Shortness of breath    Vitamin D  deficiency      Objective   BP (!) 150/76   Pulse 83   Temp 98 F (36.7 C)   Ht 5' 2 (1.575 m)   Wt 226 lb (102.5 kg)   SpO2 99%   BMI 41.34 kg/m  She was weighed on the bioimpedance scale: Body mass index is 41.34 kg/m.    Anthropometrics:  Vitals Temp: 98 F (36.7 C) BP: (!) 150/76 Pulse Rate: 83 SpO2: 99 %   Anthropometric Measurements Height: 5' 2 (1.575 m) Weight: 226 lb (102.5 kg) BMI (Calculated): 41.33 Waist Measurement : 46 inches   No data recorded Other Clinical Data RMR: 2117 Fasting: yes Labs: yes Today's Visit #: 1 Starting Date: 10/30/23 Comments: Patient has wrappings on both feet and legs(ulcers), cannot remove (bandages, strip printed but not accurate.)    Physical Exam:  General: She is overweight, cooperative, alert, well developed, and in no acute distress. PSYCH: Has normal mood, affect and thought process.   HEENT: EOMI, sclerae are anicteric. Lungs: Normal breathing effort, no  conversational dyspnea. Extremities: No edema.  Neurologic: No gross sensory or motor deficits. No tremors or fasciculations noted.    Diagnostic Data Reviewed  EKG: Normal sinus rhythm, rate 82. No changes from previous reading 02/14/23  Indirect Calorimeter completed today shows a VO2 of 306 and a REE of 2117.  Her calculated basal metabolic rate is 7272 thus her resting energy expenditure slower than calculated.  Depression Screen  Derotha's PHQ-9 score was: 4.     10/30/2023   11:31 AM  Depression screen PHQ 2/9  Decreased Interest 0  Down, Depressed, Hopeless 0  PHQ - 2 Score 0  Altered sleeping 1  Tired, decreased energy 1  Change in appetite 2  Feeling bad or failure about yourself  0  Trouble concentrating 0  Moving slowly or fidgety/restless 0  Suicidal thoughts 0  PHQ-9 Score 4  Difficult doing work/chores Not difficult at all    Screening for Sleep Related Breathing Disorders  Kaniyah admits to daytime somnolence and  admits to waking up still tired. Patient has a history of symptoms of daytime fatigue, morning fatigue, and hypertension. Deshawnda generally gets 7 hours of sleep per night, and states that she has nightime awakenings. Snoring is not present. Apneic episodes are not present. Epworth Sleepiness Score is 5.   BMET    Component Value Date/Time   NA 141 08/14/2023 1025   K 4.5 08/14/2023 1025   CL 105 08/14/2023 1025   CO2 19 (L) 08/14/2023 1025   GLUCOSE 91 08/14/2023 1025   GLUCOSE 92 01/08/2023 1154   BUN 18 08/14/2023 1025   CREATININE 1.24 (H) 08/14/2023 1025   CALCIUM 9.4 08/14/2023 1025   GFRNONAA 52 (L) 11/03/2019 1056   GFRAA 60 11/03/2019 1056   Lab Results  Component Value Date   HGBA1C 5.8 (H) 08/14/2023   HGBA1C 5.8 (H) 07/12/2016   No results found for: INSULIN CBC    Component Value Date/Time   WBC 8.4 08/14/2023 1025   WBC 8.8 02/10/2017 0458   RBC 4.35 08/14/2023 1025   RBC 3.18 (L) 02/10/2017 0458   HGB 12.6 08/14/2023  1025   HCT 38.5 08/14/2023 1025   PLT 281 08/14/2023 1025   MCV 89 08/14/2023 1025   MCH 29.0 08/14/2023 1025   MCH 27.0 02/10/2017 0458   MCHC 32.7 08/14/2023 1025   MCHC 31.7 02/10/2017 0458   RDW 13.3 08/14/2023 1025   Iron/TIBC/Ferritin/ %Sat No results found for: IRON, TIBC, FERRITIN, IRONPCTSAT Lipid Panel     Component Value Date/Time   CHOL 164 08/14/2023 1025   TRIG 97 08/14/2023 1025   HDL 71 08/14/2023 1025   CHOLHDL 2.3 08/14/2023 1025   LDLCALC 75 08/14/2023 1025   Hepatic Function Panel     Component Value Date/Time   PROT 7.3 08/14/2023 1025   ALBUMIN  4.5 08/14/2023 1025   AST 20 08/14/2023 1025   ALT 12 08/14/2023 1025   ALKPHOS 91 08/14/2023 1025   BILITOT 0.5 08/14/2023 1025      Component Value Date/Time   TSH 1.560 08/14/2023 1025     Assessment and Plan   TREATMENT PLAN FOR OBESITY:  Recommended Dietary Goals  Glen is currently in the action stage of change. As such, her goal is to implement medically supervised obesity management plan.  She has agreed to implement: the Category 3 plan - 1500 kcal per day  Behavioral Intervention  We discussed the following Behavioral Modification Strategies today: increasing lean protein intake to established goals, decreasing simple carbohydrates , increasing vegetables, increasing lower glycemic fruits, increasing fiber rich foods, avoiding skipping meals, increasing water intake, work on meal planning and preparation, reading food labels , keeping healthy foods at home, identifying sources and decreasing liquid calories, decreasing eating out or consumption of processed foods, and making healthy choices when eating convenient foods, planning for success, and better snacking choices  Additional resources provided today: Handout on healthy eating and balanced plate, Handout on complex carbohydrates and lean sources of protein, Personalized instruction on the use of artificial intelligence for  recipes, tailored meal plans, calorie tracking, and finding healthier options when eating out. , Category 3 packet, and Handout principles of weight management  Recommended Physical Activity Goals  Miriah has been advised to work up to 150 minutes of moderate intensity aerobic activity a week and strengthening exercises 2-3 times per week for cardiovascular health, weight loss maintenance and preservation of muscle mass.   She has agreed to :  Think about enjoyable ways to increase  daily physical activity and overcoming barriers to exercise and Increase physical activity in their day and reduce sedentary time (increase NEAT).  Medical Interventions and Pharmacotherapy We will work on building a Therapist, art and behavioral strategies. We will discuss the role of pharmacotherapy as an adjunct at subsequent visits.   ASSOCIATED CONDITIONS ADDRESSED TODAY  Other Fatigue  Kaeleen does feel that her weight is causing her energy to be lower than it should be. Fatigue may be related to obesity, depression or many other causes. Labs will be ordered, and in the meanwhile, Iyari will focus on self care including making healthy food choices, increasing physical activity and focusing on stress reduction. -     EKG 12-Lead  Shortness of Breath Basia notes increasing shortness of breath with physical activity and seems to be worsening over time with weight gain. She notes getting out of breath sooner with activity than she used to. This has not gotten worse recently. Reighn denies shortness of breath at rest or orthopnea.  Depression screening No depression on screening  Peripheral vascular disease (HCC) Control BP- continue Amlodipine  5 mg every day, lisinopril  20 mg every day, Torsemide  20 mg every day as needed Monitor BP and if consistently > 140/90 notify PCP  Stage 3a chronic kidney disease (HCC) Instructed to push fluids throughout the day and avoid NSAID's Limit protein  to 60-70 grams daily  Primary hypertension Control BP- continue Amlodipine  5 mg every day, lisinopril  20 mg every day, Torsemide  20 mg every day as needed Monitor BP and if consistently > 140/90 notify PCP  Primary hypothyroidism Continue Levothyroxine  112 mg every day  Pre-diabetes Initiate Category 3 meal plan and focus on limiting simple carbs, eliminate sweet drinks Loss of 10-15% body weight can improve blood sugars  Mixed hyperlipidemia Initiate Category 3 meal plan and focus on limiting saturated fats Loss of 10-15% body weight can improve lipids Continue Simvastatin  20 mg every day  Class 3 severe obesity due to excess calories with serious comorbidity and body mass index (BMI) of 40.0 to 44.9 in adult See plan above Limit protein to 60-70 grams daily Forgot to order labs- get BMP, insulin fasting and Vit B12 next visit- called pt and left message, she may return for labs  Follow-up  She was informed of the importance of frequent follow-up visits to maximize her success with intensive lifestyle modifications for her multiple health conditions. She was informed we would discuss her lab results at her next visit unless there is a critical issue that needs to be addressed sooner. Rameen agreed to keep her next visit at the agreed upon time to discuss these results.  Attestation Statement  This is the patient's intake visit at [facility]. The patient's Health Questionnaire was reviewed at length. Included in the packet: current and past health history, medications, allergies, ROS, gynecologic history (women only), surgical history, family history, social history, weight history, weight loss surgery history (for those that have had weight loss surgery), nutritional evaluation, mood and food questionnaire, PHQ9, Epworth questionnaire, sleep habits questionnaire, patient life and health improvement goals questionnaire. These will all be scanned into the patient's chart  under media.   During the visit, I independently reviewed the patient's EKG, previous labs, bioimpedance scale results, and indirect calorimetry results. I used this information to medically tailor a meal plan for the patient that will help her to lose weight and will improve her obesity-related conditions. I performed a medically necessary appropriate examination and/or evaluation.  I discussed the assessment and treatment plan with the patient. The patient was provided an opportunity to ask questions and all were answered. The patient agreed with the plan and demonstrated an understanding of the instructions. Labs were ordered at this visit and will be reviewed at the next visit unless critical results need to be addressed immediately. Clinical information was updated and documented in the EMR.   In addition, they received basic education on identification of processed foods and reduction of these, different sources of lean proteins and complex carbohydrates and how to eat balanced by incorporation of whole foods.  Reviewed by clinician on day of visit: allergies, medications, problem list, medical history, surgical history, family history, social history, and previous encounter notes.  I have spent 51 minutes in the care of the patient today including: 17 minutes before the visit reviewing and preparing the chart. 19 minutes face-to-face assessing and reviewing listed medical problems as outlined in obesity care plan, providing nutritional and behavioral counseling on topics outlined in the obesity care plan, independently interpreting test results and goals of care, as described in assessment and plan, reviewing and discussing biometric information and progress, and reviewing latest PCP notes and specialist consultations 15 minutes after the visit updating chart and documentation of encounter.       Deliliah Spranger ANP-C

## 2023-11-13 ENCOUNTER — Encounter (INDEPENDENT_AMBULATORY_CARE_PROVIDER_SITE_OTHER): Payer: Self-pay | Admitting: Nurse Practitioner

## 2023-11-13 ENCOUNTER — Ambulatory Visit (INDEPENDENT_AMBULATORY_CARE_PROVIDER_SITE_OTHER): Admitting: Nurse Practitioner

## 2023-11-13 VITALS — BP 131/74 | HR 86 | Temp 98.2°F | Ht 62.0 in | Wt 225.0 lb

## 2023-11-13 DIAGNOSIS — E66813 Obesity, class 3: Secondary | ICD-10-CM | POA: Diagnosis not present

## 2023-11-13 DIAGNOSIS — E039 Hypothyroidism, unspecified: Secondary | ICD-10-CM

## 2023-11-13 DIAGNOSIS — I10 Essential (primary) hypertension: Secondary | ICD-10-CM

## 2023-11-13 DIAGNOSIS — I129 Hypertensive chronic kidney disease with stage 1 through stage 4 chronic kidney disease, or unspecified chronic kidney disease: Secondary | ICD-10-CM | POA: Diagnosis not present

## 2023-11-13 DIAGNOSIS — N1831 Chronic kidney disease, stage 3a: Secondary | ICD-10-CM

## 2023-11-13 DIAGNOSIS — R7303 Prediabetes: Secondary | ICD-10-CM | POA: Diagnosis not present

## 2023-11-13 DIAGNOSIS — E782 Mixed hyperlipidemia: Secondary | ICD-10-CM

## 2023-11-13 DIAGNOSIS — Z6841 Body Mass Index (BMI) 40.0 and over, adult: Secondary | ICD-10-CM

## 2023-11-13 NOTE — Progress Notes (Signed)
 Office: [phone]  /  Fax: [phone]  WEIGHT SUMMARY AND BIOMETRICS  Weight Lost Since Last Visit: 1 lb  Weight Gained Since Last Visit: 0   Vitals Temp: 98.2 F (36.8 C) BP: 131/74 Pulse Rate: 86 SpO2: 99 %   Anthropometric Measurements Height: 5' 2 (1.575 m) Weight: 225 lb (102.1 kg) BMI (Calculated): 41.14 Weight at Last Visit: 226 lb Weight Lost Since Last Visit: 1 lb Weight Gained Since Last Visit: 0 Starting Weight: 226 lb Total Weight Loss (lbs): 1 lb (0.454 kg) Peak Weight: 236 lb Waist Measurement : 46 inches   Body Composition  Body Fat %: 56.2 % Fat Mass (lbs): 126.6 lbs Muscle Mass (lbs): 93.6 lbs Visceral Fat Rating : 19   Other Clinical Data Fasting: no Labs: no Today's Visit #: 2 Starting Date: 10/30/23    Total Weight Loss: 1 pound [employer] reviewed with patient: Unable to compare to last visit as was not done last visit  HPI  Chief Complaint: OBESITY  [name] is here to discuss her progress with her obesity treatment plan. She is on the the Category 3 Plan and states she is following her eating plan approximately 75-80 % of the time. Currently no exercise. She does have a stand up desk.    Interval History:  I forgot to have fasting labs drawn at last visit, next visit is scheduled in the am to get labs. Since last office visit she was having some stressors, her sister who normally lives with her is ar her niece's house until she recovers from hip replacement. 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 Her 40 year anniversary with [facility] is upcoming.  She is drinking 1 coke zero daily- has stopped drinking regular soda. Drinking 48 ounces of water a day. Stops eating after 6:30-7:00 Breakfast: toast with sugar free jelly, fair life milk, fruit Lunch: salads with protein Dinner: Vegetables, healthy choice power bowls  Alessandra does have hypertension which is currently well controlled  with amlodipine  5 mg 1 tab PO every day, lisinopril  20 mg every day and torsemide  20 mg daily as needed for edema. She did have a recent echocardiogram for heart murmur and showed mitral valve regurgitation and moderate aortic valve calcification/mild aortic valve stenosis. Left atrium was moderately dilated and right atrium was mildly dilated.  BP Readings from Last 3 Encounters:  11/13/23 131/74  10/30/23 (!) 150/76  09/26/23 (!) 126/54   She does have prediabetes and does not take medication.  She has been trying to cut back on simple carbs and has eliminated regular soda.   Her hyperlipidemia is currently well controlled with simvastatin  20 mg every day Lab Results  Component Value Date   CHOL 164 08/14/2023   HDL 71 08/14/2023   LDLCALC 75 08/14/2023   TRIG 97 08/14/2023   CHOLHDL 2.3 08/14/2023    Cancer Screenings Pap: Aged out Mammo: 12/28/22 negative Colonoscopy: 12/29/2021 due 2028  ASCVD Risk The 10-year ASCVD risk score (Arnett DK, et al., 2019) is: 15.3%- intermediate risk   Values used to calculate the score:     Age: 12 years     Clincally relevant sex: Female     Is Non-Hispanic African American: No     Diabetic: No     Tobacco smoker: No     Systolic Blood Pressure: 150 mmHg     Is BP treated: Yes     HDL Cholesterol: 71 mg/dL  Total Cholesterol: 164 mg/dL   PHYSICAL EXAM:  Blood pressure 131/74, pulse 86, temperature 98.2 F (36.8 C), height 5' 2 (1.575 m), weight 225 lb (102.1 kg), SpO2 99%. Body mass index is 41.15 kg/m.  General: Well Developed, well nourished, and in no acute distress.  HEENT: Normocephalic, atraumatic; EOMI, sclerae are anicteric. Skin: Warm and dry, good turgor Chest:  Normal excursion, shape, no gross ABN Respiratory: No conversational dyspnea; speaking in full sentences NeuroM-Sk:  Normal gross ROM * 4 extremities  Psych: A and O X 3, insight adequate, mood- full    DIAGNOSTIC DATA REVIEWED:  Last metabolic  panel Lab Results  Component Value Date   GLUCOSE 91 08/14/2023   NA 141 08/14/2023   K 4.5 08/14/2023   CL 105 08/14/2023   CO2 19 (L) 08/14/2023   BUN 18 08/14/2023   CREATININE 1.24 (H) 08/14/2023   EGFR 47 (L) 08/14/2023   CALCIUM 9.4 08/14/2023   PHOS 3.1 07/05/2016   PROT 7.3 08/14/2023   ALBUMIN  4.5 08/14/2023   LABGLOB 2.8 08/14/2023   AGRATIO 1.6 05/21/2022   BILITOT 0.5 08/14/2023   ALKPHOS 91 08/14/2023   AST 20 08/14/2023   ALT 12 08/14/2023   ANIONGAP 7 02/10/2017     Lab Results  Component Value Date   HGBA1C 5.8 (H) 08/14/2023   HGBA1C 5.8 (H) 07/12/2016   No results found for: INSULIN Lab Results  Component Value Date   TSH 1.560 08/14/2023   CBC    Component Value Date/Time   WBC 8.4 08/14/2023 1025   WBC 8.8 02/10/2017 0458   RBC 4.35 08/14/2023 1025   RBC 3.18 (L) 02/10/2017 0458   HGB 12.6 08/14/2023 1025   HCT 38.5 08/14/2023 1025   PLT 281 08/14/2023 1025   MCV 89 08/14/2023 1025   MCH 29.0 08/14/2023 1025   MCH 27.0 02/10/2017 0458   MCHC 32.7 08/14/2023 1025   MCHC 31.7 02/10/2017 0458   RDW 13.3 08/14/2023 1025   Lipid Panel     Component Value Date/Time   CHOL 164 08/14/2023 1025   TRIG 97 08/14/2023 1025   HDL 71 08/14/2023 1025   CHOLHDL 2.3 08/14/2023 1025   LDLCALC 75 08/14/2023 1025    Nutritional Lab Results  Component Value Date   VD25OH 54.8 08/14/2023   VD25OH 42.0 01/18/2022   VD25OH 42.7 01/12/2021     ASSESSMENT AND PLAN  TREATMENT PLAN FOR OBESITY:  Recommended Dietary Goals  Johnnay is currently in the action stage of change. As such, her goal is to continue weight management plan. She has agreed to the Category 3 Plan.  Behavioral Intervention  We discussed the following Behavioral Modification Strategies today: increasing lean protein intake to established goals, keeping healthy foods at home, continue to work on maintaining a reduced calorie state, getting the recommended amount of protein,  incorporating whole foods, making healthy choices, staying well hydrated and practicing mindfulness when eating., and increase protein intake, fibrous foods (25 grams per day for women, 30 grams for men) and water to improve satiety and decrease hunger signals. .We did a personalized meal plan with Chat GPT- encouraged more protein, especially at breakfast. Advised on different protein shakes as well as whey isolate protein powder that she can add to her food such as oatmeal.    Recommended Physical Activity Goals  Alanys has been advised to work up to 150 minutes of moderate intensity aerobic activity a week and strengthening exercises 2-3 times per week for cardiovascular  health, weight loss maintenance and preservation of muscle mass.   She has agreed to Think about enjoyable ways to increase daily physical activity and overcoming barriers to exercise and Increase physical activity in their day and reduce sedentary time (increase NEAT).   Pharmacotherapy We discussed various medication options to help [facility] with her weight loss efforts and we both agreed to continue nutrition and behavior modification without medication.  ASSOCIATED CONDITIONS ADDRESSED TODAY  Action/Plan  Primary hypertension Continue amlodipine  5 mg every day and lisinopril  20 mg every day   Continue Category 3 meal plan  and DASH diet Monitor BP and if consistently >140/90 notify PCP If develops headaches, chest pain, shortness of breath or dizziness go to ER Loss of 10-15% body weight can help improve blood pressures   Pre-diabetes Start Category 3  meal plan, limit simple carbohydrates Decreasing body weight by 10-15% can improve glucose levels  Primary hypothyroidism       Continue Levothyroxine  112 mcg every day       Recheck TSH next visit.  Stage 3a chronic kidney disease (HCC)       Continue to avoid NSAID medication and push water. She is currently drinking 48 ounces of water daily and 1 diet  soda  Mixed hyperlipidemia Focus on implementing category 3 meal plan, limit saturated fats Loss of 10-15% body weight can improve lipid levels Continue Simvastatin  20 mg QD  Class 3 severe obesity due to excess calories with serious comorbidity and body mass index (BMI) of 40.0 to 44.9 in adult [facility]) Will check fasting labs at next visit See plan above        Return in about 3 weeks (around 12/04/2023).SABRA She was informed of the importance of frequent follow up visits to maximize her success with intensive lifestyle modifications for her multiple health conditions.   ATTESTASTION STATEMENTS:  Reviewed by clinician on day of visit: allergies, medications, problem list, medical history, surgical history, family history, social history, and previous encounter notes.   I personally spent a total of 33 minutes in the care of the patient today including preparing to see the patient, getting/reviewing separately obtained history, performing a medically appropriate exam/evaluation, counseling and educating, documenting clinical information in the EHR, independently interpreting results, communicating results, and coordinating care.   Kenyana Husak ANP-C

## 2023-11-29 ENCOUNTER — Encounter: Payer: Self-pay | Admitting: Internal Medicine

## 2023-12-05 ENCOUNTER — Encounter (INDEPENDENT_AMBULATORY_CARE_PROVIDER_SITE_OTHER): Payer: Self-pay | Admitting: Nurse Practitioner

## 2023-12-05 ENCOUNTER — Ambulatory Visit (INDEPENDENT_AMBULATORY_CARE_PROVIDER_SITE_OTHER): Admitting: Nurse Practitioner

## 2023-12-05 VITALS — BP 148/77 | HR 77 | Temp 98.5°F | Ht 62.0 in | Wt 224.0 lb

## 2023-12-05 DIAGNOSIS — I129 Hypertensive chronic kidney disease with stage 1 through stage 4 chronic kidney disease, or unspecified chronic kidney disease: Secondary | ICD-10-CM | POA: Diagnosis not present

## 2023-12-05 DIAGNOSIS — N1831 Chronic kidney disease, stage 3a: Secondary | ICD-10-CM | POA: Diagnosis not present

## 2023-12-05 DIAGNOSIS — R7303 Prediabetes: Secondary | ICD-10-CM | POA: Diagnosis not present

## 2023-12-05 DIAGNOSIS — Z6841 Body Mass Index (BMI) 40.0 and over, adult: Secondary | ICD-10-CM

## 2023-12-05 DIAGNOSIS — E66813 Obesity, class 3: Secondary | ICD-10-CM | POA: Diagnosis not present

## 2023-12-05 DIAGNOSIS — I10 Essential (primary) hypertension: Secondary | ICD-10-CM

## 2023-12-05 NOTE — Progress Notes (Signed)
 Office: [phone]  /  Fax: [phone]  WEIGHT SUMMARY AND BIOMETRICS  Weight Lost Since Last Visit: 1 lb  Weight Gained Since Last Visit: 0   Vitals Temp: 98.5 F (36.9 C) BP: (!) 148/77 Pulse Rate: 77 SpO2: 96 %   Anthropometric Measurements Height: 5' 2 (1.575 m) Weight: 224 lb (101.6 kg) (with socks and feet wrapped, refused to take them off) BMI (Calculated): 40.96 Weight at Last Visit: 226 lb Weight Lost Since Last Visit: 1 lb Weight Gained Since Last Visit: 0 Starting Weight: 226 lb Total Weight Loss (lbs): 2 lb (0.907 kg) Peak Weight: 236 lb Waist Measurement : 46 inches   Body Composition  Body Fat %: 33 % Fat Mass (lbs): 74 lbs Muscle Mass (lbs): 142.6 lbs Total Body Water (lbs): 104.6 lbs Visceral Fat Rating : 12   Other Clinical Data Fasting: no Labs: no Today's Visit #: 3 Starting Date: 10/30/23    Total Weight Loss: 2 pound [employer] reviewed with patient: not accurate today as she had foot bandages from [facility] which she did not want to remove.   HPI  Chief Complaint: OBESITY  [name] is here to discuss her progress with her obesity treatment plan. She is on the the Category 3 Plan and states she is following her eating plan approximately 85 % of the time. She states she is not currently exercising due to joint pain.    Interval History:  Since last office visit she has been under a lot of stress. Anniversary of nephews death from Fentanyl  and mothers death in car accident.  Continues to train on new program for work. Went to fair yesterday and tried to limit what she ate- did some walking but then used cart. She has been trying to stick to her meal plan. She continues to have no sweet drinks.  She is trying to increase her fruits and vegetables.  She is trying to stick to 1500 calories and almost getting 64 ounces of water daily.  She has been getting 60 grams of protein daily- continues to try to meet her goal- limited  due to kidney functions.   She does have hypertension and is well controlled amlodipine  5 mg every day , Lisinopril  20 mg every day and torsemide  20 mg every day.  Denies headaches, chest pain, shortness of breath at rest and dizziness. BP Readings from Last 3 Encounters:  12/05/23 (!) 148/77  11/13/23 131/74  10/30/23 (!) 150/76   Continues to see the wound center for venous ulcer on LLQ- small area that will drain serous drainage. She continues to wear her compression socks daily.     PHYSICAL EXAM:  Blood pressure (!) 148/77, pulse 77, temperature 98.5 F (36.9 C), height 5' 2 (1.575 m), weight 224 lb (101.6 kg), SpO2 96%. Body mass index is 40.97 kg/m.  General: Well Developed, well nourished, and in no acute distress.  HEENT: Normocephalic, atraumatic; EOMI, sclerae are anicteric. Skin: Warm and dry, good turgor Chest:  Normal excursion, shape, no gross ABN Respiratory: No conversational dyspnea; speaking in full sentences NeuroM-Sk:  Normal gross ROM * 4 extremities  Psych: A and O X 3, insight adequate, mood- full    DIAGNOSTIC DATA REVIEWED:  BMET    Component Value Date/Time   NA 141 08/14/2023 1025   K 4.5 08/14/2023 1025   CL 105 08/14/2023 1025   CO2 19 (L) 08/14/2023 1025   GLUCOSE 91 08/14/2023 1025   GLUCOSE 92 01/08/2023 1154  BUN 18 08/14/2023 1025   CREATININE 1.24 (H) 08/14/2023 1025   CALCIUM 9.4 08/14/2023 1025   GFRNONAA 52 (L) 11/03/2019 1056   GFRAA 60 11/03/2019 1056   Lab Results  Component Value Date   HGBA1C 5.8 (H) 08/14/2023   HGBA1C 5.8 (H) 07/12/2016   No results found for: INSULIN Lab Results  Component Value Date   TSH 1.560 08/14/2023   CBC    Component Value Date/Time   WBC 8.4 08/14/2023 1025   WBC 8.8 02/10/2017 0458   RBC 4.35 08/14/2023 1025   RBC 3.18 (L) 02/10/2017 0458   HGB 12.6 08/14/2023 1025   HCT 38.5 08/14/2023 1025   PLT 281 08/14/2023 1025   MCV 89 08/14/2023 1025   MCH 29.0 08/14/2023 1025   MCH  27.0 02/10/2017 0458   MCHC 32.7 08/14/2023 1025   MCHC 31.7 02/10/2017 0458   RDW 13.3 08/14/2023 1025   Iron Studies No results found for: IRON, TIBC, FERRITIN, IRONPCTSAT Lipid Panel     Component Value Date/Time   CHOL 164 08/14/2023 1025   TRIG 97 08/14/2023 1025   HDL 71 08/14/2023 1025   CHOLHDL 2.3 08/14/2023 1025   LDLCALC 75 08/14/2023 1025   Hepatic Function Panel     Component Value Date/Time   PROT 7.3 08/14/2023 1025   ALBUMIN  4.5 08/14/2023 1025   AST 20 08/14/2023 1025   ALT 12 08/14/2023 1025   ALKPHOS 91 08/14/2023 1025   BILITOT 0.5 08/14/2023 1025      Component Value Date/Time   TSH 1.560 08/14/2023 1025   Nutritional Lab Results  Component Value Date   VD25OH 54.8 08/14/2023   VD25OH 42.0 01/18/2022   VD25OH 42.7 01/12/2021     ASSESSMENT AND PLAN  Class 3 severe obesity due to excess calories with serious comorbidity and body mass index (BMI) of 40.0 to 44.9 in adult [name]) TREATMENT PLAN FOR OBESITY:  Recommended Dietary Goals  [name] is currently in the action stage of change. As such, her goal is to continue weight management plan. She has agreed to the Category 3 Plan.  Behavioral Intervention  We discussed the following Behavioral Modification Strategies today: increasing lean protein intake to established goals, continue to work on maintaining a reduced calorie state, getting the recommended amount of protein, incorporating whole foods, making healthy choices, staying well hydrated and practicing mindfulness when eating., and increase protein intake, fibrous foods (25 grams per day for women, 30 grams for men) and water to improve satiety and decrease hunger signals. .    Recommended Physical Activity Goals  [name] has been advised to work up to 150 minutes of moderate intensity aerobic activity a week and strengthening exercises 2-3 times per week for cardiovascular health, weight loss maintenance and preservation of muscle  mass.   She has agreed to Think about enjoyable ways to increase daily physical activity and overcoming barriers to exercise, Increase physical activity in their day and reduce sedentary time (increase NEAT)., and Start aerobic activity with a goal of 150 minutes a week at moderate intensity.  Will work on adding chair exercises   Pharmacotherapy We discussed various medication options to help [name] with her weight loss efforts and we both agreed to continue nutrition and exercise.  ASSOCIATED CONDITIONS ADDRESSED TODAY  Action/Plan  Primary hypertension Continue  amlodipine  5 mg every day , Lisinopril  20 mg every day and torsemide  20 mg every day Continue Category 3 meal plan  and DASH diet Monitor BP and if  consistently >140/90 notify PCP If develops headaches, chest pain, shortness of breath or dizziness go to ER Loss of 10-15% body weight can help improve blood pressures  -     Basic metabolic panel with GFR  Pre-diabetes Continue Category 3  meal plan, limit simple carbohydrates Decreasing body weight by 10-15% can improve glucose levels Continue exercise with current goal of 150 minutes of moderate to high intensity exercise/week.  -     Insulin, random  Stage 3a chronic kidney disease ([name]) Push water and avoid NSAIDs Limit protein to 60 grams daily -     Basic metabolic panel with GFR  Class 3 severe obesity due to excess calories with serious comorbidity and body mass index (BMI) of 40.0 to 44.9 in adult [name]) See plan above -     Basic metabolic panel with GFR -     Insulin, random -     Vitamin B12         Return in about 4 weeks (around 01/02/2024).[name] She was informed of the importance of frequent follow up visits to maximize her success with intensive lifestyle modifications for her multiple health conditions.   ATTESTASTION STATEMENTS:  Reviewed by clinician on day of visit: allergies, medications, problem list, medical history, surgical history, family  history, social history, and previous encounter notes.     [name] ANP-C

## 2023-12-06 ENCOUNTER — Encounter: Payer: Self-pay | Admitting: Internal Medicine

## 2023-12-06 LAB — BASIC METABOLIC PANEL WITH GFR
BUN/Creatinine Ratio: 13 (ref 12–28)
BUN: 15 mg/dL (ref 8–27)
CO2: 21 mmol/L (ref 20–29)
Calcium: 9.4 mg/dL (ref 8.7–10.3)
Chloride: 105 mmol/L (ref 96–106)
Creatinine, Ser: 1.14 mg/dL — ABNORMAL HIGH (ref 0.57–1.00)
Glucose: 82 mg/dL (ref 70–99)
Potassium: 4.5 mmol/L (ref 3.5–5.2)
Sodium: 141 mmol/L (ref 134–144)
eGFR: 52 mL/min/1.73 — ABNORMAL LOW (ref >59)

## 2023-12-06 LAB — INSULIN, RANDOM: INSULIN: 13.4 u[IU]/mL (ref 2.6–24.9)

## 2023-12-06 LAB — VITAMIN B12: Vitamin B-12: 1154 pg/mL (ref 232–1245)

## 2023-12-09 ENCOUNTER — Ambulatory Visit (INDEPENDENT_AMBULATORY_CARE_PROVIDER_SITE_OTHER): Payer: Self-pay | Admitting: Nurse Practitioner

## 2023-12-18 ENCOUNTER — Encounter: Payer: Self-pay | Admitting: Internal Medicine

## 2024-01-02 ENCOUNTER — Ambulatory Visit (INDEPENDENT_AMBULATORY_CARE_PROVIDER_SITE_OTHER): Payer: Self-pay | Admitting: Nurse Practitioner

## 2024-01-03 DIAGNOSIS — Z1231 Encounter for screening mammogram for malignant neoplasm of breast: Secondary | ICD-10-CM | POA: Diagnosis not present

## 2024-01-03 LAB — HM MAMMOGRAPHY

## 2024-02-20 ENCOUNTER — Other Ambulatory Visit: Payer: Self-pay

## 2024-02-20 ENCOUNTER — Other Ambulatory Visit (HOSPITAL_COMMUNITY): Payer: Self-pay

## 2024-02-20 ENCOUNTER — Other Ambulatory Visit (HOSPITAL_BASED_OUTPATIENT_CLINIC_OR_DEPARTMENT_OTHER): Payer: Self-pay

## 2024-02-20 ENCOUNTER — Encounter: Payer: Self-pay | Admitting: Internal Medicine

## 2024-02-20 ENCOUNTER — Ambulatory Visit: Payer: Self-pay | Admitting: Internal Medicine

## 2024-02-20 VITALS — BP 110/70 | HR 87 | Temp 98.3°F | Ht 62.0 in | Wt 232.4 lb

## 2024-02-20 DIAGNOSIS — I119 Hypertensive heart disease without heart failure: Secondary | ICD-10-CM

## 2024-02-20 DIAGNOSIS — L97909 Non-pressure chronic ulcer of unspecified part of unspecified lower leg with unspecified severity: Secondary | ICD-10-CM | POA: Diagnosis not present

## 2024-02-20 DIAGNOSIS — L03116 Cellulitis of left lower limb: Secondary | ICD-10-CM | POA: Diagnosis not present

## 2024-02-20 DIAGNOSIS — I83009 Varicose veins of unspecified lower extremity with ulcer of unspecified site: Secondary | ICD-10-CM | POA: Diagnosis not present

## 2024-02-20 DIAGNOSIS — I1310 Hypertensive heart and chronic kidney disease without heart failure, with stage 1 through stage 4 chronic kidney disease, or unspecified chronic kidney disease: Secondary | ICD-10-CM | POA: Diagnosis not present

## 2024-02-20 DIAGNOSIS — N1831 Chronic kidney disease, stage 3a: Secondary | ICD-10-CM | POA: Diagnosis not present

## 2024-02-20 DIAGNOSIS — E2839 Other primary ovarian failure: Secondary | ICD-10-CM | POA: Diagnosis not present

## 2024-02-20 DIAGNOSIS — I358 Other nonrheumatic aortic valve disorders: Secondary | ICD-10-CM | POA: Diagnosis not present

## 2024-02-20 DIAGNOSIS — E039 Hypothyroidism, unspecified: Secondary | ICD-10-CM

## 2024-02-20 DIAGNOSIS — Z0000 Encounter for general adult medical examination without abnormal findings: Secondary | ICD-10-CM | POA: Diagnosis not present

## 2024-02-20 DIAGNOSIS — R7309 Other abnormal glucose: Secondary | ICD-10-CM

## 2024-02-20 DIAGNOSIS — I739 Peripheral vascular disease, unspecified: Secondary | ICD-10-CM | POA: Diagnosis not present

## 2024-02-20 MED ORDER — LEVOTHYROXINE SODIUM 112 MCG PO TABS
112.0000 ug | ORAL_TABLET | Freq: Every day | ORAL | 1 refills | Status: AC
  Filled 2024-02-20 – 2024-03-11 (×5): qty 90, 90d supply, fill #0

## 2024-02-20 MED ORDER — AMLODIPINE BESYLATE 5 MG PO TABS
5.0000 mg | ORAL_TABLET | Freq: Every evening | ORAL | 2 refills | Status: AC
  Filled 2024-02-20 – 2024-03-11 (×5): qty 90, 90d supply, fill #0

## 2024-02-20 MED ORDER — CEFTRIAXONE SODIUM 1 G IJ SOLR
1.0000 g | Freq: Once | INTRAMUSCULAR | Status: AC
  Administered 2024-02-20: 1 g via INTRAMUSCULAR

## 2024-02-20 MED ORDER — FLUCONAZOLE 100 MG PO TABS
100.0000 mg | ORAL_TABLET | Freq: Every day | ORAL | 0 refills | Status: AC
  Filled 2024-02-20: qty 7, 7d supply, fill #0

## 2024-02-20 NOTE — Progress Notes (Signed)
 I,Victoria T Emmitt, CMA,acting as a neurosurgeon for Catheryn LOISE Slocumb, MD.,have documented all relevant documentation on the behalf of Catheryn LOISE Slocumb, MD,as directed by  Catheryn LOISE Slocumb, MD while in the presence of Catheryn LOISE Slocumb, MD.  Subjective:    Patient ID: [name] , female    DOB: [birth year] , 69 y.o.   MRN: [id number]  Chief Complaint  Patient presents with   Annual Exam    Patient is here for annual exam. Patient reports compliance with her meds. Denies headache, chest pain & sob. She has no specific questions or concerns.    Hypertension   Hypothyroidism    HPI Discussed the use of AI scribe software for clinical note transcription with the patient, who gave verbal consent to proceed.  History of Present Illness [name] is a 69 year old female with chronic kidney disease and leg wounds who presents for a physical exam and blood pressure check.  She has ongoing issues with leg wounds and reports drainage from them. She continues to attend the wound clinic and has an appointment scheduled for next week. She has been managing the wounds with gauze and wraps since 2018.  She has chronic kidney disease, stage three, which has been stable. She had an ultrasound of her kidneys last year. She is willing to see a kidney specialist at [facility].  She is considering weight loss options and has previously attended a weight loss program. She does not consume certain foods like fish. She is exploring options like Wegovy  and Golo for weight management.  She has been managing her medications through mail order, with a preference for receiving them at work due to concerns about her neighborhood. She uses a healthcare savings account for copays.  She received a high dosage flu shot and had a bone density test in July. She had a mammogram on November 21st and is awaiting results.  She is currently working and has no plans to retire soon. She has expressed stress related to a  new system at work and has difficulty learning through video calls. She has been working for 47 years and learned computer skills on the job.   Patient is here for annual exam. Patient reports compliance with her meds. Denies headache, chest pain & sob. She has no specific questions or concerns.   Hypertension This is a chronic problem. The current episode started more than 1 year ago. The problem has been gradually improving since onset. The problem is controlled. Pertinent negatives include no blurred vision, chest pain, palpitations or shortness of breath. Risk factors for coronary artery disease include obesity, sedentary lifestyle and post-menopausal state. Compliance problems include exercise.      Past Medical History:  Diagnosis Date   Arthritis    Bilateral swelling of feet    Constipation    DOE (dyspnea on exertion)    mild   Dyspnea    on exertion   Heart burn    Heart murmur    Hyperlipidemia    Hypertension    Hypothyroidism    Joint pain    Kidney disease    stage 3 chronic   Morbid obesity (HCC)    Osteoarthritis    Osteoporosis    Pre-diabetes    Prediabetes    Schamberg disease    Shortness of breath    Vitamin D  deficiency      Family History  Problem Relation Age of Onset   Heart disease Mother  Hyperlipidemia Mother    Hypertension Mother    Heart Problems Mother        Pacemaker   Thyroid  disease Mother    Heart disease Father    Hyperlipidemia Father    Hypertension Father    Heart attack Father        at age 3.   Heart attack Maternal Uncle    Stroke Paternal Aunt    Alzheimer's disease Maternal Grandmother    Heart attack Maternal Grandfather 83       Died of AAA   AAA (abdominal aortic aneurysm) Maternal Grandfather     Current Medications[1]   Allergies[2]    The patient states she uses post menopausal status for birth control. No LMP recorded. Patient is postmenopausal.. Negative for Dysmenorrhea. Negative for: breast  discharge, breast lump(s), breast pain and breast self exam. Associated symptoms include abnormal vaginal bleeding. Pertinent negatives include abnormal bleeding (hematology), anxiety, decreased libido, depression, difficulty falling sleep, dyspareunia, history of infertility, nocturia, sexual dysfunction, sleep disturbances, urinary incontinence, urinary urgency, vaginal discharge and vaginal itching. Diet regular.The patient states her exercise level is  minimal.  . The patient's tobacco use is: Tobacco Use History[3]. She has been exposed to passive smoke. The patient's alcohol use is:  Social History   Substance and Sexual Activity  Alcohol Use No    Review of Systems  Constitutional: Negative.   HENT: Negative.    Eyes: Negative.  Negative for blurred vision.  Respiratory: Negative.  Negative for shortness of breath.   Cardiovascular: Negative.  Negative for chest pain and palpitations.  Gastrointestinal: Negative.   Endocrine: Negative.   Genitourinary: Negative.   Musculoskeletal: Negative.   Skin:  Positive for wound.  Allergic/Immunologic: Negative.   Neurological: Negative.   Hematological: Negative.   Psychiatric/Behavioral: Negative.       Today's Vitals   02/20/24 0952  BP: 110/70  Pulse: 87  Temp: 98.3 F (36.8 C)  SpO2: 98%  Weight: 232 lb 6.4 oz (105.4 kg)  Height: 5' 2 (1.575 m)   Body mass index is 42.51 kg/m.  Wt Readings from Last 3 Encounters:  02/20/24 232 lb 6.4 oz (105.4 kg)  12/05/23 224 lb (101.6 kg)  11/13/23 225 lb (102.1 kg)     Objective:  Physical Exam Vitals and nursing note reviewed.  Constitutional:      Appearance: Normal appearance.     Comments: Seated in chair for exam, kept shoes/socks on  HENT:     Head: Normocephalic and atraumatic.     Right Ear: Tympanic membrane, ear canal and external ear normal.     Left Ear: Tympanic membrane, ear canal and external ear normal.     Nose: Nose normal.     Mouth/Throat:     Mouth:  Mucous membranes are moist.     Pharynx: Oropharynx is clear.  Eyes:     Extraocular Movements: Extraocular movements intact.     Conjunctiva/sclera: Conjunctivae normal.     Pupils: Pupils are equal, round, and reactive to light.  Cardiovascular:     Rate and Rhythm: Normal rate and regular rhythm.     Heart sounds: Murmur heard.  Pulmonary:     Effort: Pulmonary effort is normal.     Breath sounds: Normal breath sounds.  Chest:  Breasts:    Tanner Score is 5.     Right: Normal.     Left: Normal.     Comments: Pendulous  Abdominal:     General: Bowel sounds  are normal.     Palpations: Abdomen is soft.     Comments: Obese, soft.   Genitourinary:    Comments: deferred Musculoskeletal:        General: Normal range of motion.     Cervical back: Normal range of motion and neck supple.     Right lower leg: Edema present.     Left lower leg: Edema present.     Comments: Bandages are soaking wet and malodorous   Feet:     Comments: She kept her shoes on Skin:    General: Skin is warm and dry.     Findings: Erythema and lesion present.     Comments: Erythema b/l lower extremities B/l LE venous ulcers Serosanguinous drainage Slight warmth to touch Erythematous, satellite lesions above ulcer on LLE  Neurological:     General: No focal deficit present.     Mental Status: She is alert and oriented to person, place, and time.  Psychiatric:        Mood and Affect: Mood normal.        Behavior: Behavior normal.         Assessment And Plan:     Physical exam, annual Assessment & Plan: A full exam was performed.  Importance of monthly self breast exams was discussed with the patient.  She is advised to get 30-45 minutes of regular exercise, no less than four to five days per week. Both weight-bearing and aerobic exercises are recommended.  She is advised to follow a healthy diet with at least six fruits/veggies per day, decrease intake of red meat and other saturated fats and to  increase fish intake to twice weekly.  Meats/fish should not be fried -- baked, boiled or broiled is preferable. It is also important to cut back on your sugar intake.  Be sure to read labels - try to avoid anything with added sugar, high fructose corn syrup or other sweeteners.  If you must use a sweetener, you can try stevia or monkfruit.  It is also important to avoid artificially sweetened foods/beverages and diet drinks. Lastly, wear SPF 50 sunscreen on exposed skin and when in direct sunlight for an extended period of time.  Be sure to avoid fast food restaurants and aim for at least 60 ounces of water daily.      Orders: -     CBC -     CMP14+EGFR -     Lipid panel -     Hemoglobin A1c  Hypertensive heart and renal disease with renal failure, stage 1 through stage 4 or unspecified chronic kidney disease, without heart failure Assessment & Plan: Chronic, well controlled.  EKG performed, NSR w/ low voltage.  - She will continue with amlodipine  5mg  daily and lisinopril  20mg  daily.  - She takes torsemide  as needed for LE edema.  - She is reminded to follow a low sodium diet.  - Follow up in six months.  Orders: -     POCT urinalysis dipstick -     Microalbumin / creatinine urine ratio -     EKG 12-Lead  Peripheral vascular disease Assessment & Plan: Chronic, encouraged to increase daily activity as tolerated. She should continue with statin therapy as well.    Aortic valve sclerosis Assessment & Plan: Echo results from Jan 2023 were reviewed in full detail, aortic calcification listed as mild.  Will consider repeat echo within 6-12 months.    Stage 3a chronic kidney disease (HCC) Assessment & Plan: Chronic, she  is encouraged to stay well hydrated, avoid NSAIDs and keep BP controlled to prevent progression of CKD.   - She now agrees to Nephrology evaluation.  Orders: -     PTH, intact and calcium -     Phosphorus -     Protein electrophoresis, serum -     Ambulatory  referral to Nephrology  Primary hypothyroidism Assessment & Plan: Chronic, currently on levothyroxine  112mcg daily. Importance of medication compliance was discussed with the patient. I will check thyroid  panel and adjust meds as needed.   Orders: -     TSH + free T4  Cellulitis of left lower extremity Assessment & Plan: She was given Rocephin  500mg  IM x 1. Appears to be bacterial superimposed on fungal infection.  - Will send rx Augmentin  to take twice daily, encouraged to take full course.  - Start diflucan , advised to take every other day - Encouraged to keep both legs clean and dry - Follow up with Wound clinic next week as scheduled.   Orders: -     cefTRIAXone  Sodium  Venous ulcer (HCC) Assessment & Plan: Chronic.  Bandages removed, areas cleaned and dressed with nonstick gauze. Encouraged to keep upcoming Wound Clinic appt. - Elevate legs when seated.    Other abnormal glucose Assessment & Plan: Previous labs reviewed, her A1c has been elevated in the past. I will check an A1c today. Reminded to avoid refined sugars including sugary drinks/foods and processed meats including bacon, sausages and deli meats.     Estrogen deficiency  Morbid obesity (HCC) Assessment & Plan: BMI 42. Discussed weight loss options including Wegovy  and Golo. Wegovy  not covered by insurance, costs $150, potential samples available. Golo less known efficacy. She is considering options based on budget and preference. - Remove sugary foods/drinks from diet.  - Increase daily activity, perform chair exercises daily.   Other orders -     amLODIPine  Besylate; Take 1 tablet (5 mg total) by mouth at bedtime.  Dispense: 90 tablet; Refill: 2 -     Levothyroxine  Sodium; Take 1 tablet (112 mcg total) by mouth daily.  Dispense: 90 tablet; Refill: 1 -     Fluconazole ; Take 1 tablet (100 mg total) by mouth daily for 7 days.  Dispense: 7 tablet; Refill: 0    Return for 1 YEAR HM, 6 MONTH BP &  THYROID . Patient was given opportunity to ask questions. Patient verbalized understanding of the plan and was able to repeat key elements of the plan. All questions were answered to their satisfaction.   I, Catheryn LOISE Slocumb, MD, have reviewed all documentation for this visit. The documentation on 02/20/2024 for the exam, diagnosis, procedures, and orders are all accurate and complete.     [1]  Current Outpatient Medications:    Calcium Carbonate-Vitamin D  600-400 MG-UNIT tablet, Take 1 tablet by mouth daily., Disp: , Rfl:    Cholecalciferol (VITAMIN D ) 2000 units tablet, Take 4,000 Units by mouth daily., Disp: , Rfl:    fluconazole  (DIFLUCAN ) 100 MG tablet, Take 1 tablet (100 mg total) by mouth daily for 7 days., Disp: 7 tablet, Rfl: 0   lisinopril  (ZESTRIL ) 20 MG tablet, Take 1 tablet (20 mg total) by mouth daily., Disp: 90 tablet, Rfl: 2   Magnesium  400 MG TABS, Take by mouth., Disp: , Rfl:    Multiple Vitamins-Minerals (CENTRUM SILVER 50+WOMEN PO), Take by mouth. 1 per day, Disp: , Rfl:    potassium chloride  SA (KLOR-CON  M) 20 MEQ tablet, Take 1 tablet (20  mEq total) by mouth daily as needed (when taking Torsemide )., Disp: 90 tablet, Rfl: 1   simvastatin  (ZOCOR ) 20 MG tablet, Take 1 tablet (20 mg total) by mouth daily., Disp: 90 tablet, Rfl: 1   torsemide  (DEMADEX ) 20 MG tablet, Take 1 tablet (20 mg total) by mouth daily as needed (for edema)., Disp: 90 tablet, Rfl: 1   amLODipine  (NORVASC ) 5 MG tablet, Take 1 tablet (5 mg total) by mouth at bedtime., Disp: 90 tablet, Rfl: 2   doxycycline  (VIBRAMYCIN ) 100 MG capsule, Take 1 capsule (100 mg total) by mouth 2 (two) times daily for 10 days., Disp: 20 capsule, Rfl: 0   levothyroxine  (SYNTHROID ) 112 MCG tablet, Take 1 tablet (112 mcg total) by mouth daily., Disp: 90 tablet, Rfl: 1 [2]  Allergies Allergen Reactions   Latex Rash, Itching and Swelling  [3]  Social History Tobacco Use  Smoking Status Never  Smokeless Tobacco Never

## 2024-02-20 NOTE — Patient Instructions (Signed)

## 2024-02-21 ENCOUNTER — Other Ambulatory Visit: Payer: Self-pay

## 2024-02-21 LAB — PROTEIN ELECTROPHORESIS, SERUM
A/G Ratio: 0.9 (ref 0.7–1.7)
Albumin ELP: 3.3 g/dL (ref 2.9–4.4)
Alpha 1: 0.3 g/dL (ref 0.0–0.4)
Alpha 2: 1 g/dL (ref 0.4–1.0)
Beta: 1.2 g/dL (ref 0.7–1.3)
Gamma Globulin: 1.3 g/dL (ref 0.4–1.8)
Globulin, Total: 3.8 g/dL (ref 2.2–3.9)

## 2024-02-21 LAB — CMP14+EGFR
ALT: 11 IU/L (ref 0–32)
AST: 18 IU/L (ref 0–40)
Albumin: 4 g/dL (ref 3.9–4.9)
Alkaline Phosphatase: 92 IU/L (ref 49–135)
BUN/Creatinine Ratio: 32 — ABNORMAL HIGH (ref 12–28)
BUN: 69 mg/dL — ABNORMAL HIGH (ref 8–27)
Bilirubin Total: 0.4 mg/dL (ref 0.0–1.2)
CO2: 17 mmol/L — ABNORMAL LOW (ref 20–29)
Calcium: 9.4 mg/dL (ref 8.7–10.3)
Chloride: 101 mmol/L (ref 96–106)
Creatinine, Ser: 2.15 mg/dL — ABNORMAL HIGH (ref 0.57–1.00)
Globulin, Total: 3.1 g/dL (ref 1.5–4.5)
Glucose: 84 mg/dL (ref 70–99)
Potassium: 5.1 mmol/L (ref 3.5–5.2)
Sodium: 136 mmol/L (ref 134–144)
Total Protein: 7.1 g/dL (ref 6.0–8.5)
eGFR: 24 mL/min/1.73 — ABNORMAL LOW

## 2024-02-21 LAB — HEMOGLOBIN A1C
Est. average glucose Bld gHb Est-mCnc: 131 mg/dL
Hgb A1c MFr Bld: 6.2 % — ABNORMAL HIGH (ref 4.8–5.6)

## 2024-02-21 LAB — MICROALBUMIN / CREATININE URINE RATIO
Creatinine, Urine: 52.5 mg/dL
Microalb/Creat Ratio: 7 mg/g{creat} (ref 0–29)
Microalbumin, Urine: 3.8 ug/mL

## 2024-02-21 LAB — CBC
Hematocrit: 33.4 % — ABNORMAL LOW (ref 34.0–46.6)
Hemoglobin: 10.9 g/dL — ABNORMAL LOW (ref 11.1–15.9)
MCH: 28.8 pg (ref 26.6–33.0)
MCHC: 32.6 g/dL (ref 31.5–35.7)
MCV: 88 fL (ref 79–97)
Platelets: 349 x10E3/uL (ref 150–450)
RBC: 3.79 x10E6/uL (ref 3.77–5.28)
RDW: 13.3 % (ref 11.7–15.4)
WBC: 10 x10E3/uL (ref 3.4–10.8)

## 2024-02-21 LAB — TSH+FREE T4
Free T4: 1.59 ng/dL (ref 0.82–1.77)
TSH: 1.83 u[IU]/mL (ref 0.450–4.500)

## 2024-02-21 LAB — LIPID PANEL
Chol/HDL Ratio: 2.4 ratio (ref 0.0–4.4)
Cholesterol, Total: 176 mg/dL (ref 100–199)
HDL: 72 mg/dL
LDL Chol Calc (NIH): 87 mg/dL (ref 0–99)
Triglycerides: 97 mg/dL (ref 0–149)
VLDL Cholesterol Cal: 17 mg/dL (ref 5–40)

## 2024-02-21 LAB — PHOSPHORUS: Phosphorus: 4.9 mg/dL — ABNORMAL HIGH (ref 3.0–4.3)

## 2024-02-21 LAB — PTH, INTACT AND CALCIUM: PTH: 55 pg/mL (ref 15–65)

## 2024-02-22 ENCOUNTER — Other Ambulatory Visit (HOSPITAL_COMMUNITY): Payer: Self-pay

## 2024-02-26 ENCOUNTER — Other Ambulatory Visit: Payer: Self-pay

## 2024-02-26 ENCOUNTER — Encounter (HOSPITAL_BASED_OUTPATIENT_CLINIC_OR_DEPARTMENT_OTHER): Attending: General Surgery | Admitting: General Surgery

## 2024-02-26 ENCOUNTER — Other Ambulatory Visit (HOSPITAL_COMMUNITY): Payer: Self-pay

## 2024-02-26 ENCOUNTER — Ambulatory Visit: Payer: Self-pay | Admitting: Internal Medicine

## 2024-02-26 DIAGNOSIS — I890 Lymphedema, not elsewhere classified: Secondary | ICD-10-CM | POA: Insufficient documentation

## 2024-02-26 DIAGNOSIS — L97812 Non-pressure chronic ulcer of other part of right lower leg with fat layer exposed: Secondary | ICD-10-CM | POA: Insufficient documentation

## 2024-02-26 DIAGNOSIS — I87333 Chronic venous hypertension (idiopathic) with ulcer and inflammation of bilateral lower extremity: Secondary | ICD-10-CM | POA: Diagnosis not present

## 2024-02-26 DIAGNOSIS — L97822 Non-pressure chronic ulcer of other part of left lower leg with fat layer exposed: Secondary | ICD-10-CM | POA: Diagnosis present

## 2024-02-26 MED ORDER — DOXYCYCLINE HYCLATE 100 MG PO CAPS
100.0000 mg | ORAL_CAPSULE | Freq: Two times a day (BID) | ORAL | 0 refills | Status: AC
  Filled 2024-02-26: qty 20, 10d supply, fill #0

## 2024-02-28 ENCOUNTER — Encounter: Payer: Self-pay | Admitting: Internal Medicine

## 2024-02-29 ENCOUNTER — Encounter: Payer: Self-pay | Admitting: Internal Medicine

## 2024-02-29 DIAGNOSIS — L03116 Cellulitis of left lower limb: Secondary | ICD-10-CM | POA: Insufficient documentation

## 2024-02-29 NOTE — Assessment & Plan Note (Signed)
 Previous labs reviewed, her A1c has been elevated in the past. I will check an A1c today. Reminded to avoid refined sugars including sugary drinks/foods and processed meats including bacon, sausages and deli meats.

## 2024-02-29 NOTE — Assessment & Plan Note (Addendum)
 Chronic.  Bandages removed, areas cleaned and dressed with nonstick gauze. Encouraged to keep upcoming Wound Clinic appt. - Elevate legs when seated.

## 2024-02-29 NOTE — Assessment & Plan Note (Signed)
 Echo results from Jan 2023 were reviewed in full detail, aortic calcification listed as mild.  Will consider repeat echo within 6-12 months.

## 2024-02-29 NOTE — Assessment & Plan Note (Signed)
 She was given Rocephin  500mg  IM x 1. Appears to be bacterial superimposed on fungal infection.  - Will send rx Augmentin  to take twice daily, encouraged to take full course.  - Start diflucan , advised to take every other day - Encouraged to keep both legs clean and dry - Follow up with Wound clinic next week as scheduled.

## 2024-02-29 NOTE — Assessment & Plan Note (Signed)

## 2024-02-29 NOTE — Assessment & Plan Note (Signed)
 Chronic, encouraged to increase daily activity as tolerated. She should continue with statin therapy as well.

## 2024-02-29 NOTE — Assessment & Plan Note (Signed)
 Chronic, well controlled.  EKG performed, NSR w/ low voltage.  - She will continue with amlodipine  5mg  daily and lisinopril  20mg  daily.  - She takes torsemide  as needed for LE edema.  - She is reminded to follow a low sodium diet.  - Follow up in six months.

## 2024-02-29 NOTE — Assessment & Plan Note (Signed)
 Chronic, currently on levothyroxine daily. Importance of medication compliance was discussed with the patient. I will check thyroid panel and adjust meds as needed.

## 2024-02-29 NOTE — Assessment & Plan Note (Signed)
 BMI 42. Discussed weight loss options including Wegovy  and Golo. Wegovy  not covered by insurance, costs $150, potential samples available. Golo less known efficacy. She is considering options based on budget and preference. - Remove sugary foods/drinks from diet.  - Increase daily activity, perform chair exercises daily.

## 2024-02-29 NOTE — Assessment & Plan Note (Signed)
 Chronic, she is encouraged to stay well hydrated, avoid NSAIDs and keep BP controlled to prevent progression of CKD.   - She now agrees to Nephrology evaluation.

## 2024-03-04 ENCOUNTER — Other Ambulatory Visit (HOSPITAL_COMMUNITY): Payer: Self-pay

## 2024-03-05 ENCOUNTER — Encounter (HOSPITAL_BASED_OUTPATIENT_CLINIC_OR_DEPARTMENT_OTHER): Admitting: General Surgery

## 2024-03-05 ENCOUNTER — Other Ambulatory Visit (HOSPITAL_COMMUNITY): Payer: Self-pay

## 2024-03-05 DIAGNOSIS — L97822 Non-pressure chronic ulcer of other part of left lower leg with fat layer exposed: Secondary | ICD-10-CM | POA: Diagnosis not present

## 2024-03-11 ENCOUNTER — Other Ambulatory Visit (HOSPITAL_COMMUNITY): Payer: Self-pay

## 2024-03-12 ENCOUNTER — Other Ambulatory Visit (HOSPITAL_COMMUNITY): Payer: Self-pay

## 2024-03-12 ENCOUNTER — Encounter (HOSPITAL_BASED_OUTPATIENT_CLINIC_OR_DEPARTMENT_OTHER): Admitting: General Surgery

## 2024-03-12 DIAGNOSIS — L97822 Non-pressure chronic ulcer of other part of left lower leg with fat layer exposed: Secondary | ICD-10-CM | POA: Diagnosis not present

## 2024-03-13 ENCOUNTER — Other Ambulatory Visit: Payer: Self-pay

## 2024-03-13 DIAGNOSIS — Z79899 Other long term (current) drug therapy: Secondary | ICD-10-CM

## 2024-03-13 LAB — CMP14+EGFR
ALT: 12 [IU]/L (ref 0–32)
AST: 16 [IU]/L (ref 0–40)
Albumin: 3.9 g/dL (ref 3.9–4.9)
Alkaline Phosphatase: 77 [IU]/L (ref 49–135)
BUN/Creatinine Ratio: 18 (ref 12–28)
BUN: 23 mg/dL (ref 8–27)
Bilirubin Total: 0.5 mg/dL (ref 0.0–1.2)
CO2: 20 mmol/L (ref 20–29)
Calcium: 8.8 mg/dL (ref 8.7–10.3)
Chloride: 108 mmol/L — ABNORMAL HIGH (ref 96–106)
Creatinine, Ser: 1.25 mg/dL — ABNORMAL HIGH (ref 0.57–1.00)
Globulin, Total: 2.8 g/dL (ref 1.5–4.5)
Glucose: 83 mg/dL (ref 70–99)
Potassium: 4.4 mmol/L (ref 3.5–5.2)
Sodium: 139 mmol/L (ref 134–144)
Total Protein: 6.7 g/dL (ref 6.0–8.5)
eGFR: 46 mL/min/{1.73_m2} — ABNORMAL LOW

## 2024-03-13 LAB — BASIC METABOLIC PANEL WITH GFR
BUN: 23 — AB (ref 4–21)
Creatinine: 1.3 — AB (ref 0.5–1.1)
Glucose: 83
Potassium: 4.4 meq/L (ref 3.5–5.1)
Sodium: 139 (ref 137–147)

## 2024-03-13 LAB — COMPREHENSIVE METABOLIC PANEL WITH GFR: eGFR: 46

## 2024-03-15 ENCOUNTER — Ambulatory Visit: Payer: Self-pay | Admitting: Internal Medicine

## 2024-03-19 ENCOUNTER — Encounter (HOSPITAL_BASED_OUTPATIENT_CLINIC_OR_DEPARTMENT_OTHER): Admitting: General Surgery

## 2024-03-27 ENCOUNTER — Encounter (HOSPITAL_BASED_OUTPATIENT_CLINIC_OR_DEPARTMENT_OTHER): Admitting: General Surgery

## 2024-04-02 ENCOUNTER — Encounter (HOSPITAL_BASED_OUTPATIENT_CLINIC_OR_DEPARTMENT_OTHER): Admitting: General Surgery

## 2024-04-09 ENCOUNTER — Encounter (HOSPITAL_BASED_OUTPATIENT_CLINIC_OR_DEPARTMENT_OTHER): Admitting: General Surgery

## 2024-08-24 ENCOUNTER — Ambulatory Visit: Payer: Self-pay | Admitting: Internal Medicine

## 2025-02-22 ENCOUNTER — Encounter: Payer: Self-pay | Admitting: Internal Medicine
